# Patient Record
Sex: Female | Born: 1975
Health system: Southern US, Community
[De-identification: ages and names within clinical notes are randomized; demographics above are authoritative.]

## PROBLEM LIST (undated history)

## (undated) DIAGNOSIS — K219 Gastro-esophageal reflux disease without esophagitis: Secondary | ICD-10-CM

## (undated) DIAGNOSIS — K3184 Gastroparesis: Secondary | ICD-10-CM

## (undated) DIAGNOSIS — R519 Headache, unspecified: Secondary | ICD-10-CM

## (undated) DIAGNOSIS — R51 Headache: Secondary | ICD-10-CM

## (undated) DIAGNOSIS — T8859XA Other complications of anesthesia, initial encounter: Secondary | ICD-10-CM

## (undated) DIAGNOSIS — F329 Major depressive disorder, single episode, unspecified: Secondary | ICD-10-CM

## (undated) DIAGNOSIS — M549 Dorsalgia, unspecified: Secondary | ICD-10-CM

## (undated) DIAGNOSIS — R7303 Prediabetes: Secondary | ICD-10-CM

## (undated) DIAGNOSIS — Z9889 Other specified postprocedural states: Secondary | ICD-10-CM

## (undated) DIAGNOSIS — D219 Benign neoplasm of connective and other soft tissue, unspecified: Secondary | ICD-10-CM

## (undated) DIAGNOSIS — F319 Bipolar disorder, unspecified: Secondary | ICD-10-CM

## (undated) DIAGNOSIS — J189 Pneumonia, unspecified organism: Secondary | ICD-10-CM

## (undated) DIAGNOSIS — F4310 Post-traumatic stress disorder, unspecified: Secondary | ICD-10-CM

## (undated) DIAGNOSIS — F419 Anxiety disorder, unspecified: Secondary | ICD-10-CM

## (undated) DIAGNOSIS — T7840XA Allergy, unspecified, initial encounter: Secondary | ICD-10-CM

## (undated) DIAGNOSIS — G4730 Sleep apnea, unspecified: Secondary | ICD-10-CM

## (undated) DIAGNOSIS — F32A Depression, unspecified: Secondary | ICD-10-CM

## (undated) DIAGNOSIS — R112 Nausea with vomiting, unspecified: Secondary | ICD-10-CM

## (undated) DIAGNOSIS — K829 Disease of gallbladder, unspecified: Secondary | ICD-10-CM

## (undated) DIAGNOSIS — E559 Vitamin D deficiency, unspecified: Secondary | ICD-10-CM

## (undated) DIAGNOSIS — H9319 Tinnitus, unspecified ear: Secondary | ICD-10-CM

## (undated) DIAGNOSIS — Z973 Presence of spectacles and contact lenses: Secondary | ICD-10-CM

## (undated) DIAGNOSIS — T4145XA Adverse effect of unspecified anesthetic, initial encounter: Secondary | ICD-10-CM

## (undated) HISTORY — DX: Major depressive disorder, single episode, unspecified: F32.9

## (undated) HISTORY — DX: Gastroparesis: K31.84

## (undated) HISTORY — PX: ABDOMINAL HYSTERECTOMY: SHX81

## (undated) HISTORY — DX: Anxiety disorder, unspecified: F41.9

## (undated) HISTORY — DX: Benign neoplasm of connective and other soft tissue, unspecified: D21.9

## (undated) HISTORY — DX: Allergy, unspecified, initial encounter: T78.40XA

## (undated) HISTORY — DX: Depression, unspecified: F32.A

## (undated) HISTORY — PX: WISDOM TOOTH EXTRACTION: SHX21

## (undated) HISTORY — DX: Dorsalgia, unspecified: M54.9

## (undated) HISTORY — DX: Gastro-esophageal reflux disease without esophagitis: K21.9

## (undated) HISTORY — PX: UPPER GASTROINTESTINAL ENDOSCOPY: SHX188

## (undated) HISTORY — PX: PILONIDAL CYST EXCISION: SHX744

## (undated) HISTORY — DX: Bipolar disorder, unspecified: F31.9

## (undated) HISTORY — DX: Vitamin D deficiency, unspecified: E55.9

## (undated) HISTORY — DX: Disease of gallbladder, unspecified: K82.9

---

## 1995-09-15 HISTORY — PX: DILATION AND CURETTAGE OF UTERUS: SHX78

## 2006-07-15 HISTORY — PX: CHOLECYSTECTOMY: SHX55

## 2009-09-14 HISTORY — PX: ANKLE ARTHROSCOPY: SHX545

## 2009-09-14 HISTORY — PX: TENDON REPAIR: SHX5111

## 2012-09-14 DIAGNOSIS — K3184 Gastroparesis: Secondary | ICD-10-CM

## 2012-09-14 HISTORY — DX: Gastroparesis: K31.84

## 2015-10-19 DIAGNOSIS — F3489 Other specified persistent mood disorders: Secondary | ICD-10-CM | POA: Diagnosis not present

## 2015-10-26 DIAGNOSIS — F3489 Other specified persistent mood disorders: Secondary | ICD-10-CM | POA: Diagnosis not present

## 2015-10-26 DIAGNOSIS — F4310 Post-traumatic stress disorder, unspecified: Secondary | ICD-10-CM | POA: Diagnosis not present

## 2015-11-09 DIAGNOSIS — F4310 Post-traumatic stress disorder, unspecified: Secondary | ICD-10-CM | POA: Diagnosis not present

## 2015-11-09 DIAGNOSIS — F3489 Other specified persistent mood disorders: Secondary | ICD-10-CM | POA: Diagnosis not present

## 2015-11-11 MED FILL — L-METHYLFOLATE CALCIUM 15 M: 15 | 30 days supply | Qty: 30 | Fill #0

## 2015-11-11 MED FILL — QUETIAPINE FUMARATE 400 MG: 400 | 30 days supply | Qty: 30 | Fill #0

## 2015-11-11 MED FILL — clonazePAM 1 MG TABS: 1 | 30 days supply | Qty: 60 | Fill #0

## 2015-11-13 MED FILL — lamoTRIgine 200 MG TABS: 200 | 30 days supply | Qty: 30 | Fill #0

## 2015-11-16 DIAGNOSIS — F4310 Post-traumatic stress disorder, unspecified: Secondary | ICD-10-CM | POA: Diagnosis not present

## 2015-11-16 DIAGNOSIS — F3489 Other specified persistent mood disorders: Secondary | ICD-10-CM | POA: Diagnosis not present

## 2015-11-23 DIAGNOSIS — F4310 Post-traumatic stress disorder, unspecified: Secondary | ICD-10-CM | POA: Diagnosis not present

## 2015-11-23 DIAGNOSIS — F3489 Other specified persistent mood disorders: Secondary | ICD-10-CM | POA: Diagnosis not present

## 2015-11-26 DIAGNOSIS — F3181 Bipolar II disorder: Secondary | ICD-10-CM | POA: Diagnosis not present

## 2015-11-27 DIAGNOSIS — F3181 Bipolar II disorder: Secondary | ICD-10-CM | POA: Diagnosis not present

## 2015-12-01 ENCOUNTER — Ambulatory Visit (INDEPENDENT_AMBULATORY_CARE_PROVIDER_SITE_OTHER): Payer: 59 | Admitting: Family Medicine

## 2015-12-01 VITALS — BP 114/66 | HR 77 | Temp 97.5°F | Resp 16 | Ht 64.5 in | Wt 270.4 lb

## 2015-12-01 DIAGNOSIS — E661 Drug-induced obesity: Secondary | ICD-10-CM | POA: Diagnosis not present

## 2015-12-01 DIAGNOSIS — Z13220 Encounter for screening for lipoid disorders: Secondary | ICD-10-CM | POA: Diagnosis not present

## 2015-12-01 DIAGNOSIS — Z131 Encounter for screening for diabetes mellitus: Secondary | ICD-10-CM

## 2015-12-01 DIAGNOSIS — R3129 Other microscopic hematuria: Secondary | ICD-10-CM

## 2015-12-01 DIAGNOSIS — Z0000 Encounter for general adult medical examination without abnormal findings: Secondary | ICD-10-CM | POA: Diagnosis not present

## 2015-12-01 DIAGNOSIS — Z1159 Encounter for screening for other viral diseases: Secondary | ICD-10-CM | POA: Diagnosis not present

## 2015-12-01 DIAGNOSIS — Z114 Encounter for screening for human immunodeficiency virus [HIV]: Secondary | ICD-10-CM

## 2015-12-01 DIAGNOSIS — F3131 Bipolar disorder, current episode depressed, mild: Secondary | ICD-10-CM | POA: Diagnosis not present

## 2015-12-01 LAB — POCT URINALYSIS DIP (MANUAL ENTRY)
Bilirubin, UA: NEGATIVE
Glucose, UA: NEGATIVE
Ketones, POC UA: NEGATIVE
Leukocytes, UA: NEGATIVE
Nitrite, UA: POSITIVE — AB
Spec Grav, UA: >=1.03
Urobilinogen, UA: 0.2
pH, UA: 5.5

## 2015-12-01 LAB — CBC WITH DIFFERENTIAL/PLATELET
Basophils Absolute: 0 10*3/uL (ref 0.0–0.1)
Basophils Relative: 0 % (ref 0–1)
Eosinophils Absolute: 0.4 10*3/uL (ref 0.0–0.7)
Eosinophils Relative: 5 % (ref 0–5)
HCT: 37.3 % (ref 36.0–46.0)
Hemoglobin: 12.4 g/dL (ref 12.0–15.0)
Lymphocytes Relative: 25 % (ref 12–46)
Lymphs Abs: 1.9 10*3/uL (ref 0.7–4.0)
MCH: 27.9 pg (ref 26.0–34.0)
MCHC: 33.2 g/dL (ref 30.0–36.0)
MCV: 84 fL (ref 78.0–100.0)
MPV: 10.9 fL (ref 8.6–12.4)
Monocytes Absolute: 0.6 10*3/uL (ref 0.1–1.0)
Monocytes Relative: 8 % (ref 3–12)
Neutro Abs: 4.7 10*3/uL (ref 1.7–7.7)
Neutrophils Relative %: 62 % (ref 43–77)
Platelets: 318 10*3/uL (ref 150–400)
RBC: 4.44 MIL/uL (ref 3.87–5.11)
RDW: 15.1 % (ref 11.5–15.5)
WBC: 7.5 10*3/uL (ref 4.0–10.5)

## 2015-12-01 LAB — POC MICROSCOPIC URINALYSIS (UMFC)

## 2015-12-01 LAB — COMPREHENSIVE METABOLIC PANEL
ALT: 13 U/L (ref 6–29)
AST: 15 U/L (ref 10–30)
Albumin: 3.9 g/dL (ref 3.6–5.1)
Alkaline Phosphatase: 89 U/L (ref 33–115)
BUN: 11 mg/dL (ref 7–25)
CO2: 24 mmol/L (ref 20–31)
Calcium: 8.9 mg/dL (ref 8.6–10.2)
Chloride: 103 mmol/L (ref 98–110)
Creat: 0.89 mg/dL (ref 0.50–1.10)
Glucose, Bld: 87 mg/dL (ref 65–99)
Potassium: 4.2 mmol/L (ref 3.5–5.3)
Sodium: 137 mmol/L (ref 135–146)
Total Bilirubin: 0.3 mg/dL (ref 0.2–1.2)
Total Protein: 7.6 g/dL (ref 6.1–8.1)

## 2015-12-01 LAB — LIPID PANEL
Cholesterol: 147 mg/dL (ref 125–200)
HDL: 36 mg/dL — ABNORMAL LOW (ref >=46)
LDL Cholesterol: 74 mg/dL (ref <130)
Total CHOL/HDL Ratio: 4.1 Ratio (ref <=5.0)
Triglycerides: 184 mg/dL — ABNORMAL HIGH (ref <150)
VLDL: 37 mg/dL — ABNORMAL HIGH (ref <30)

## 2015-12-01 LAB — HEMOGLOBIN A1C
Hgb A1c MFr Bld: 5.6 % (ref <5.7)
Mean Plasma Glucose: 114 mg/dL (ref <117)

## 2015-12-01 LAB — VITAMIN B12: Vitamin B-12: 309 pg/mL (ref 200–1100)

## 2015-12-01 LAB — HIV ANTIBODY (ROUTINE TESTING W REFLEX): HIV 1&2 Ab, 4th Generation: NONREACTIVE

## 2015-12-01 LAB — TSH: TSH: 1.83 mIU/L

## 2015-12-01 LAB — T4, FREE: Free T4: 1 ng/dL (ref 0.8–1.8)

## 2015-12-01 NOTE — Progress Notes (Signed)
Subjective:    Patient ID: Caroline Gonzales, female    DOB: August 12, 1976, 40 y.o.   MRN: [id number]  12/01/2015  Annual Exam   HPI This 40 y.o. female presents for Complete Physical Examination.  Last physical:  2014 Pap smear:  Scheduled in two weeks; needs IUD changed out. Mammogram:  To see gynecology this month. Colonoscopy: never TDAP:  09/2015 Pneumovax:  never Influenza:  06/2015 Eye exam:  +glasses; scheduled next month Dental exam:  Next month.  Sees David Fuller/psychiatry every 3 months; followed by therapist every week.  DDD lumbar: s/p several injections lumbar spine; no injections in one year.  Lost 17 pounds since move.  Decreased eating out to once per week; no breads or chips until eats fruits and vegetables.  Son has lost weight. Moved from [place].   Review of Systems  Constitutional: Negative for fever, chills, diaphoresis, activity change, appetite change, fatigue and unexpected weight change.  HENT: Negative for congestion, dental problem, drooling, ear discharge, ear pain, facial swelling, hearing loss, mouth sores, nosebleeds, postnasal drip, rhinorrhea, sinus pressure, sneezing, sore throat, tinnitus, trouble swallowing and voice change.   Eyes: Negative for photophobia, pain, discharge, redness, itching and visual disturbance.  Respiratory: Negative for apnea, cough, choking, chest tightness, shortness of breath, wheezing and stridor.   Cardiovascular: Negative for chest pain, palpitations and leg swelling.  Gastrointestinal: Negative for nausea, vomiting, abdominal pain, diarrhea, constipation, blood in stool, abdominal distention, anal bleeding and rectal pain.  Endocrine: Negative for cold intolerance, heat intolerance, polydipsia, polyphagia and polyuria.  Genitourinary: Negative for dysuria, urgency, frequency, hematuria, flank pain, decreased urine volume, vaginal bleeding, vaginal discharge, enuresis, difficulty urinating, genital sores, vaginal pain,  menstrual problem, pelvic pain and dyspareunia.  Musculoskeletal: Negative for myalgias, back pain, joint swelling, arthralgias, gait problem, neck pain and neck stiffness.  Skin: Negative for color change, pallor, rash and wound.  Allergic/Immunologic: Negative for environmental allergies, food allergies and immunocompromised state.  Neurological: Negative for dizziness, tremors, seizures, syncope, facial asymmetry, speech difficulty, weakness, light-headedness, numbness and headaches.  Hematological: Negative for adenopathy. Does not bruise/bleed easily.  Psychiatric/Behavioral: Negative for suicidal ideas, hallucinations, behavioral problems, confusion, sleep disturbance, self-injury, dysphoric mood, decreased concentration and agitation. The patient is not nervous/anxious and is not hyperactive.     Past Medical History  Diagnosis Date  . Depression   . Gastroparesis 09/14/2012    gastric emptying study in 2014  . Allergy     Zyrtec, Allegra.  . Anxiety     followed by Dr. Toy Cookey   Past Surgical History  Procedure Laterality Date  . Cholecystectomy    . Tendon repair Left     Left Ankle  . Cesarean section     Allergies  Allergen Reactions  . Tamiflu [Oseltamivir Phosphate] Anaphylaxis  . Cephalosporins Rash  . Penicillins Rash   Current Outpatient Prescriptions  Medication Sig Dispense Refill  . cetirizine (ZYRTEC) 10 MG tablet Take 10 mg by mouth daily.    . clonazePAM (KLONOPIN) 1 MG tablet Take 1 mg by mouth 2 (two) times daily as needed for anxiety.    . L-methylfolate Calcium 15 MG TABS Take 15 m by mouth daily.    [name] lamoTRIgine (LAMICTAL) 200 MG tablet Take 200 mg by mouth at bedtime.    . propranolol (INDERAL) 40 MG tablet Take 40 mg by mouth 2 (two) times daily.    . QUEtiapine (SEROQUEL) 400 MG tablet Take 400 mg by mouth at bedtime.    [name]  ranitidine (ZANTAC) 150 MG tablet Take 150 mg by mouth at bedtime.     No current facility-administered medications for this  visit.   Social History   Social History  . Marital Status: Married    Spouse Name: N/A  . Number of Children: N/A  . Years of Education: N/A   Occupational History  . Not on file.   Social History Main Topics  . Smoking status: Never Smoker   . Smokeless tobacco: Never Used  . Alcohol Use: No  . Drug Use: No  . Sexual Activity: Not on file   Other Topics Concern  . 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      Seatbelt: 100%; no texting while driving often       Family History  Problem Relation Age of Onset  . Cancer Mother     squamous cell carcinoma  . Heart disease Father     cardiomegaly, CHF  . Hyperlipidemia Father   . Hypertension Father   . Mental retardation Father   . Cancer Maternal Grandmother   . Diabetes Maternal Grandmother   . Heart disease Maternal Grandmother   . Hyperlipidemia Maternal Grandmother   . Hypertension Maternal Grandmother   . Stroke Maternal Grandmother   . Heart disease Paternal Grandmother   . Hypertension Paternal Grandmother   . Heart disease Paternal Grandfather   . Hyperlipidemia Paternal Grandfather   . Mental illness Paternal Grandfather        Objective:    BP 114/66 mmHg  Pulse 77  Temp(Src) 97.5 F (36.4 C) (Oral)  Resp 16  Ht 5' 4.5" (1.638 m)  Wt 270 lb 6.4 oz (122.653 kg)  BMI 45.71 kg/m2  SpO2 98% Physical Exam  Constitutional: She is oriented to person, place, and time. She appears well-developed and well-nourished. No distress.  obese  HENT:  Head: Normocephalic and atraumatic.  Right Ear: External ear normal.  Left Ear: External ear normal.  Nose: Nose normal.    Mouth/Throat: Oropharynx is clear and moist.  Eyes: Conjunctivae and EOM are normal. Pupils are equal, round, and reactive to light.  Neck: Normal range of motion and full passive range of motion without pain. Neck supple. No JVD present. Carotid bruit is not present. No thyromegaly present.  Cardiovascular: Normal rate, regular rhythm and normal heart sounds.  Exam reveals no gallop and no friction rub.   No murmur heard. Pulmonary/Chest: Effort normal and breath sounds normal. She has no wheezes. She has no rales.  Abdominal: Soft. Bowel sounds are normal. She exhibits no distension and no mass. There is no tenderness. There is no rebound and no guarding.  Musculoskeletal:       Right shoulder: Normal.       Left shoulder: Normal.       Cervical back: Normal.  Lymphadenopathy:    She has no cervical adenopathy.  Neurological: She is alert and oriented to person, place, and time. She has normal reflexes. No cranial nerve deficit. She exhibits normal muscle tone. Coordination  normal.  Skin: Skin is warm and dry. No rash noted. She is not diaphoretic. No erythema. No pallor.  Scattered skin changes on upper arms and upper back; no irregularity.  Psychiatric: She has a normal mood and affect. Her behavior is normal. Judgment and thought content normal.  Nursing note and vitals reviewed.  No results found for this or any previous visit.     Assessment & Plan:   1. Routine physical examination   2. Screening for diabetes mellitus   3. Screening, lipid   4. Mild depressed bipolar I disorder ([place])   5. Screening for HIV (human immunodeficiency virus)   6. Hematuria, microscopic   7. Morbid drug-induced obesity    -anticipatory guidance -- weight loss, exercise, low-caloric food choices. [email or web address].[email or web address] -immunizations UTD. -obtain labs. -gynecological care through gynecology; Mirena IUD for contraception.   Orders Placed This Encounter  Procedures  . CBC with Differential/Platelet   . Comprehensive metabolic panel    Order Specific Question:  Has the patient fasted?    Answer:  Yes  . Hemoglobin A1c  . Lipid panel    Order Specific Question:  Has the patient fasted?    Answer:  Yes  . TSH  . T4, free  . Vitamin B12  . VITAMIN D 25 Hydroxy (Vit-D Deficiency, Fractures)  . HIV antibody  . POCT urinalysis dipstick  . POCT Microscopic Urinalysis ([facility])   Meds ordered this encounter  Medications  . ranitidine (ZANTAC) 150 MG tablet    Sig: Take 150 mg by mouth at bedtime.  . cetirizine (ZYRTEC) 10 MG tablet    Sig: Take 10 mg by mouth daily.  . QUEtiapine (SEROQUEL) 400 MG tablet    Sig: Take 400 mg by mouth at bedtime.  . lamoTRIgine (LAMICTAL) 200 MG tablet    Sig: Take 200 mg by mouth at bedtime.  . L-methylfolate Calcium 15 MG TABS    Sig: Take 15 m by mouth daily.  . clonazePAM (KLONOPIN) 1 MG tablet    Sig: Take 1 mg by mouth 2 (two) times daily as needed for anxiety.  . propranolol (INDERAL) 40 MG tablet    Sig: Take 40 mg by mouth 2 (two) times daily.    No Follow-up on file.    Caroline Gonzales Elayne Guerin, M.D. [facility] [address] [place], [place]  [address] [phone] phone [phone] fax

## 2015-12-01 NOTE — Patient Instructions (Addendum)
IF you received an x-ray today, you will receive an invoice from [facility]. Please contact [facility] at [phone] with questions or concerns regarding your invoice.   IF you received labwork today, you will receive an invoice from [employer]. Please contact [facility] at [phone] with questions or concerns regarding your invoice.   Our billing staff will not be able to assist you with questions regarding bills from these companies.  You will be contacted with the lab results as soon as they are available. The fastest way to get your results is to activate your My Chart account. Instructions are located on the last page of this paperwork. If you have not heard from [name] regarding the results in 2 weeks, please contact this office.    Dr. Jacelyn Grip of [place] for back issues. [facility]. [email or web address]    Keeping You Healthy  Get These Tests 1. Blood Pressure- Have your blood pressure checked once a year by your health care provider.  Normal blood pressure is 120/80. 2. Weight- Have your body mass index (BMI) calculated to screen for obesity.  BMI is measure of body fat based on height and weight.  You can also calculate your own BMI at GravelBags.it. 3. Cholesterol- Have your cholesterol checked every 5 years starting at age 34 then yearly starting at age 41. 18. Chlamydia, HIV, and other sexually transmitted diseases- Get screened every year until age 25, then within three months of each new sexual provider. 5. Pap Test - Every 1-5 years; discuss with your health care provider. 6. Mammogram- Every 1-2 years starting at age 5--50  Take these medicines  Calcium with Vitamin D-Your body needs 1200 mg of Calcium each day and [phone] IU of Vitamin D daily.  Your body can only absorb 500 mg of Calcium at a time so Calcium must be taken in 2 or 3 divided doses throughout the day.  Multivitamin with folic  acid- Once daily if it is possible for you to become pregnant.  Get these Immunizations  Gardasil-Series of three doses; prevents HPV related illness such as genital warts and cervical cancer.  Menactra-Single dose; prevents meningitis.  Tetanus shot- Every 10 years.  Flu shot-Every year.  Take these steps 1. Do not smoke-Your healthcare provider can help you quit.  For tips on how to quit go to www.smokefree.gov or call 1-800 QUITNOW. 2. Be physically active- Exercise 5 days a week for at least 30 minutes.  If you are not already physically active, start slow and gradually work up to 30 minutes of moderate physical activity.  Examples of moderate activity include walking briskly, dancing, swimming, bicycling, etc. 3. Breast Cancer- A self breast exam every month is important for early detection of breast cancer.  For more information and instruction on self breast exams, ask your healthcare provider or https://www.patel.info/. 4. Eat a healthy diet- Eat a variety of healthy foods such as fruits, vegetables, whole grains, low fat milk, low fat cheeses, yogurt, lean meats, poultry and fish, beans, nuts, tofu, etc.  For more information go to www. Thenutritionsource.org 5. Drink alcohol in moderation- Limit alcohol intake to one drink or less per day. Never drink and drive. 6. Depression- Your emotional health is as important as your physical health.  If you're feeling down or losing interest in things you normally enjoy please talk to your healthcare provider about being screened for depression. 7. Dental visit- Brush and floss your teeth twice daily; visit your dentist  twice a year. 8. Eye doctor- Get an eye exam at least every 2 years. 9. Helmet use- Always wear a helmet when riding a bicycle, motorcycle, rollerblading or skateboarding. 42. Safe sex- If you may be exposed to sexually transmitted infections, use a condom. 11. Seat belts- Seat belts can save your live;  always wear one. 12. Smoke/Carbon Monoxide detectors- These detectors need to be installed on the appropriate level of your home. Replace batteries at least once a year. 13. Skin cancer- When out in the sun please cover up and use sunscreen 15 SPF or higher. 14. Violence- If anyone is threatening or hurting you, please tell your healthcare provider.

## 2015-12-02 LAB — VITAMIN D 25 HYDROXY (VIT D DEFICIENCY, FRACTURES): Vit D, 25-Hydroxy: 12 ng/mL — ABNORMAL LOW (ref 30–100)

## 2015-12-05 ENCOUNTER — Encounter: Payer: Self-pay | Admitting: Family Medicine

## 2015-12-09 ENCOUNTER — Encounter: Payer: Self-pay | Admitting: Family Medicine

## 2015-12-09 MED FILL — clonazePAM 1 MG TABS: 1 | 30 days supply | Qty: 60 | Fill #1

## 2015-12-09 MED FILL — lamoTRIgine 200 MG TABS: 200 | 30 days supply | Qty: 30 | Fill #1

## 2015-12-09 MED FILL — QUETIAPINE FUMARATE 400 MG: 400 | 30 days supply | Qty: 30 | Fill #1

## 2015-12-09 MED FILL — L-METHYLFOLATE CALCIUM 15 M: 15 | 30 days supply | Qty: 30 | Fill #1

## 2015-12-12 ENCOUNTER — Encounter: Payer: Self-pay | Admitting: Obstetrics and Gynecology

## 2015-12-12 ENCOUNTER — Ambulatory Visit (INDEPENDENT_AMBULATORY_CARE_PROVIDER_SITE_OTHER): Payer: 59 | Admitting: Obstetrics and Gynecology

## 2015-12-12 ENCOUNTER — Other Ambulatory Visit: Payer: Self-pay

## 2015-12-12 VITALS — BP 112/70 | HR 84 | Resp 16 | Ht 64.5 in | Wt 269.0 lb

## 2015-12-12 DIAGNOSIS — Z01419 Encounter for gynecological examination (general) (routine) without abnormal findings: Secondary | ICD-10-CM | POA: Diagnosis not present

## 2015-12-12 DIAGNOSIS — Z30431 Encounter for routine checking of intrauterine contraceptive device: Secondary | ICD-10-CM

## 2015-12-12 DIAGNOSIS — Z1231 Encounter for screening mammogram for malignant neoplasm of breast: Secondary | ICD-10-CM

## 2015-12-12 DIAGNOSIS — Z124 Encounter for screening for malignant neoplasm of cervix: Secondary | ICD-10-CM

## 2015-12-12 DIAGNOSIS — Z1151 Encounter for screening for human papillomavirus (HPV): Secondary | ICD-10-CM | POA: Diagnosis not present

## 2015-12-12 NOTE — Patient Instructions (Signed)

## 2015-12-12 NOTE — Progress Notes (Signed)
Patient ID: Caroline Gonzales, female   DOB: [birth year], 40 y.o.   MRN: [id number] 40 y.o. G1P1001 MarriedCaucasianF here for annual exam.  She has a mirena, placed 3 years ago. No cycles. Starting to have cramping 1 x a month, helped with ibuprofen. Last summer she was having increased cramping, ultrasound showed the IUD in place. She isn't sure how long her strings were. Since then the pain is worse, but just coming 1 x a month. Sexually active, no pain. She is working on weight loss, down 18 lbs in 2-3 months.     No LMP recorded. Patient is not currently having periods (Reason: IUD).          Sexually active: Yes.    The current method of family planning is IUD.    Exercising: No.  The patient does not participate in regular exercise at present. She is walking.  Smoker:  no  Health Maintenance: Pap:  2015 WNL per patient  History of abnormal Pap:  no MMG:  Never Colonoscopy:  Never BMD:   Never TDaP:  09/2015 Gardasil: N/A   reports that she has never smoked. She has never used smokeless tobacco. She reports that she drinks alcohol. She reports that she does not use illicit drugs.Minimal ETOH, palliative care nurse. She has a 75 year old son.   Past Medical History  Diagnosis Date  . Depression   . Gastroparesis 09/14/2012    gastric emptying study in 2014  . Allergy     Zyrtec, Allegra.  . Anxiety     followed by Dr. Toy Cookey  . Fibroid   1 small fibroid  Past Surgical History  Procedure Laterality Date  . Cholecystectomy    . Tendon repair Left     Left Ankle  . Cesarean section    . Dilation and curettage of uterus      Current Outpatient Prescriptions  Medication Sig Dispense Refill  . cetirizine (ZYRTEC) 10 MG tablet Take 10 mg by mouth daily.    . clonazePAM (KLONOPIN) 1 MG tablet Take 1 mg by mouth 2 (two) times daily as needed for anxiety.    . L-methylfolate Calcium 15 MG TABS Take 15 m by mouth daily.    [name] lamoTRIgine (LAMICTAL) 200 MG tablet Take 200 mg by mouth  at bedtime.    . propranolol (INDERAL) 40 MG tablet Take 40 mg by mouth 2 (two) times daily.    . QUEtiapine (SEROQUEL) 400 MG tablet Take 400 mg by mouth at bedtime.    . ranitidine (ZANTAC) 150 MG tablet Take 150 mg by mouth at bedtime.     No current facility-administered medications for this visit.    Family History  Problem Relation Age of Onset  . Cancer Mother     squamous cell carcinoma  . Heart disease Father     cardiomegaly, CHF  . Hyperlipidemia Father   . Hypertension Father   . Mental retardation Father   . Cancer Maternal Grandmother   . Diabetes Maternal Grandmother   . Heart disease Maternal Grandmother   . Hyperlipidemia Maternal Grandmother   . Hypertension Maternal Grandmother   . Stroke Maternal Grandmother   . Heart disease Paternal Grandmother   . Hypertension Paternal Grandmother   . Heart disease Paternal Grandfather   . Hyperlipidemia Paternal Grandfather   . Mental illness Paternal Grandfather   . Breast cancer Maternal Aunt   . Thyroid cancer Paternal Aunt     Review of Systems  Constitutional: Negative.  HENT: Negative.   Eyes: Negative.   Respiratory: Negative.   Cardiovascular: Negative.   Gastrointestinal: Negative.   Endocrine: Negative.   Genitourinary: Negative.   Musculoskeletal: Negative.   Skin: Negative.   Allergic/Immunologic: Negative.   Neurological: Negative.   Psychiatric/Behavioral: Negative.     Exam:   BP 112/70 mmHg  Pulse 84  Resp 16  Ht 5' 4.5" (1.638 m)  Wt 269 lb (122.018 kg)  BMI 45.48 kg/m2  Weight change: @WEIGHTCHANGE @ Height:   Height: 5' 4.5" (163.8 cm)  Ht Readings from Last 3 Encounters:  12/12/15 5' 4.5" (1.638 m)  12/01/15 5' 4.5" (1.638 m)    General appearance: alert, cooperative and appears stated age Head: Normocephalic, without obvious abnormality, atraumatic Neck: no adenopathy, supple, symmetrical, trachea midline and thyroid normal to inspection and palpation Lungs: clear to  auscultation bilaterally Breasts: normal appearance, no masses or tenderness Heart: regular rate and rhythm Abdomen: soft, non-tender; bowel sounds normal; no masses,  no organomegaly Extremities: extremities normal, atraumatic, no cyanosis or edema Skin: Skin color, texture, turgor normal. No rashes or lesions Lymph nodes: Cervical, supraclavicular, and axillary nodes normal. No abnormal inguinal nodes palpated Neurologic: Grossly normal   Pelvic: External genitalia:  no lesions              Urethra:  normal appearing urethra with no masses, tenderness or lesions              Bartholins and Skenes: normal                 Vagina: normal appearing vagina with normal color and discharge, no lesions              Cervix: no lesions, IUD string 4-5 cm               Bimanual Exam:  Uterus:  normal size, contour, position, consistency, mobility, non-tender              Adnexa: no mass, fullness, tenderness               Rectovaginal: Confirms               Anus:  normal sphincter tone, no lesions  Chaperone was present for exam.  A:  Well Woman with normal exam  Screening cervical cancer  IUD check, she has had the IUD for 3 years, increase in cyclic cramping. She reports a normal ultrasound last summer. The strings are 4-5 cm currently, unsure if changed.   P:   Labs and immunizations with her primary MD  IUD due for removal in 2019  Pap with hpv  Discussed breast self exam  Discussed calcium and vit D intake  Mammogram, number given  Get a copy of her gyn records, if her string is getting longer, would recommend an ultrasound to check location.

## 2015-12-14 DIAGNOSIS — F3489 Other specified persistent mood disorders: Secondary | ICD-10-CM | POA: Diagnosis not present

## 2015-12-14 DIAGNOSIS — F4310 Post-traumatic stress disorder, unspecified: Secondary | ICD-10-CM | POA: Diagnosis not present

## 2015-12-17 ENCOUNTER — Encounter: Payer: Self-pay | Admitting: Family Medicine

## 2015-12-17 DIAGNOSIS — M5136 Other intervertebral disc degeneration, lumbar region: Secondary | ICD-10-CM

## 2015-12-17 LAB — IPS PAP TEST WITH HPV

## 2015-12-17 NOTE — Telephone Encounter (Signed)
There is no referral in the system for neurosurgery.  I am unable to send the patient's records until one is placed.  Thank you.

## 2015-12-19 NOTE — Telephone Encounter (Signed)
Referral placed; please proceed.

## 2015-12-26 DIAGNOSIS — H40013 Open angle with borderline findings, low risk, bilateral: Secondary | ICD-10-CM | POA: Diagnosis not present

## 2015-12-26 DIAGNOSIS — H5210 Myopia, unspecified eye: Secondary | ICD-10-CM | POA: Diagnosis not present

## 2015-12-26 DIAGNOSIS — H2189 Other specified disorders of iris and ciliary body: Secondary | ICD-10-CM | POA: Diagnosis not present

## 2015-12-26 DIAGNOSIS — H40053 Ocular hypertension, bilateral: Secondary | ICD-10-CM | POA: Diagnosis not present

## 2015-12-28 DIAGNOSIS — F4310 Post-traumatic stress disorder, unspecified: Secondary | ICD-10-CM | POA: Diagnosis not present

## 2015-12-28 DIAGNOSIS — F3489 Other specified persistent mood disorders: Secondary | ICD-10-CM | POA: Diagnosis not present

## 2016-01-02 ENCOUNTER — Ambulatory Visit
Admission: RE | Admit: 2016-01-02 | Discharge: 2016-01-02 | Disposition: A | Discharge status: Home / Self Care | Payer: 59 | Source: Ambulatory Visit

## 2016-01-02 DIAGNOSIS — Z1231 Encounter for screening mammogram for malignant neoplasm of breast: Secondary | ICD-10-CM

## 2016-01-04 DIAGNOSIS — F3489 Other specified persistent mood disorders: Secondary | ICD-10-CM | POA: Diagnosis not present

## 2016-01-04 DIAGNOSIS — F4310 Post-traumatic stress disorder, unspecified: Secondary | ICD-10-CM | POA: Diagnosis not present

## 2016-01-11 DIAGNOSIS — F3489 Other specified persistent mood disorders: Secondary | ICD-10-CM | POA: Diagnosis not present

## 2016-01-14 MED FILL — QUETIAPINE FUMARATE 400 MG: 400 | 30 days supply | Qty: 30 | Fill #2

## 2016-01-14 MED FILL — clonazePAM 1 MG TABS: 1 | 30 days supply | Qty: 60 | Fill #2

## 2016-01-14 MED FILL — L-METHYLFOLATE CALCIUM 15 M: 15 | 30 days supply | Qty: 30 | Fill #2

## 2016-01-14 MED FILL — lamoTRIgine 200 MG TABS: 200 | 30 days supply | Qty: 30 | Fill #0

## 2016-01-15 ENCOUNTER — Telehealth: Payer: Self-pay | Admitting: Obstetrics and Gynecology

## 2016-01-15 NOTE — Telephone Encounter (Signed)
I spoke with the patient and she says that her cramping has gotten much better. She will call us if it starts to get bad again and will proceed with the ultra sound then. -eh

## 2016-01-15 NOTE — Telephone Encounter (Signed)
Please let the patient know that I have reviewed her old records. IUD was in place on her ultrasound in 7/16. The chart doesn't mention the length of her IUD strings with the insertion or at the f/u visit. If her cramping is getting worse, the safest thing to do is another ultrasound. Please review with the patient.

## 2016-01-18 DIAGNOSIS — F4310 Post-traumatic stress disorder, unspecified: Secondary | ICD-10-CM | POA: Diagnosis not present

## 2016-01-18 DIAGNOSIS — F3489 Other specified persistent mood disorders: Secondary | ICD-10-CM | POA: Diagnosis not present

## 2016-01-27 DIAGNOSIS — F3181 Bipolar II disorder: Secondary | ICD-10-CM | POA: Diagnosis not present

## 2016-02-01 DIAGNOSIS — F4310 Post-traumatic stress disorder, unspecified: Secondary | ICD-10-CM | POA: Diagnosis not present

## 2016-02-01 DIAGNOSIS — F3489 Other specified persistent mood disorders: Secondary | ICD-10-CM | POA: Diagnosis not present

## 2016-02-07 MED FILL — L-METHYLFOLATE CALCIUM 15 M: 15 | 30 days supply | Qty: 30 | Fill #3

## 2016-02-07 MED FILL — lamoTRIgine 200 MG TABS: 200 | 30 days supply | Qty: 30 | Fill #1

## 2016-02-07 MED FILL — QUETIAPINE FUMARATE 400 MG: 400 | 30 days supply | Qty: 30 | Fill #3

## 2016-02-14 MED FILL — clonazePAM 1 MG TABS: 1 | 30 days supply | Qty: 60 | Fill #3

## 2016-02-15 DIAGNOSIS — F4310 Post-traumatic stress disorder, unspecified: Secondary | ICD-10-CM | POA: Diagnosis not present

## 2016-02-15 DIAGNOSIS — F3489 Other specified persistent mood disorders: Secondary | ICD-10-CM | POA: Diagnosis not present

## 2016-02-18 ENCOUNTER — Telehealth: Payer: 59 | Admitting: Family

## 2016-02-18 DIAGNOSIS — H00015 Hordeolum externum left lower eyelid: Secondary | ICD-10-CM

## 2016-02-18 DIAGNOSIS — M5136 Other intervertebral disc degeneration, lumbar region: Secondary | ICD-10-CM | POA: Diagnosis not present

## 2016-02-18 MED ORDER — NEOMYCIN-POLYMYXIN-HC 3.5-10000-1 OP SUSP: 3.0000 [drp] | Freq: Four times a day (QID) | OPHTHALMIC | Status: DC

## 2016-02-18 NOTE — Progress Notes (Signed)
We are sorry that you are not feeling well. Here is how we plan to help!  Based on what you have shared with me it looks like you have a stye.  A stye is an inflammation of the eyelid.  It is often a red, painful lump near the edge of the eyelid that may look like a boil or a pimple.  A stye develops when an infection occurs at the base of an eyelash.   We have made appropriate suggestions for you based upon your presentation: Your symptoms may indicate an infection of the sclera.  The use of anti-inflammatory and antibiotic eye drops for a week will help resolve this condition.  I have sent in neomycin-polymyxin HC opthalmic suspension, two to three drops in the affected eye every 4 hours.  If your symptoms do not improve over the next two to three days you should be seen in your doctor's office.  HOME CARE:   Wash your hands often!  Let the stye open on its own. Don't squeeze or open it.  Don't rub your eyes. This can irritate your eyes and let in bacteria.  If you need to touch your eyes, wash your hands first.  Don't wear eye makeup or contact lenses until the area has healed.  GET HELP RIGHT AWAY IF:   Your symptoms do not improve.  You develop blurred or loss of vision.  Your symptoms worsen (increased discharge, pain or redness).  Thank you for choosing an e-visit.  Your e-visit answers were reviewed by a board certified advanced clinical practitioner to complete your personal care plan.  Depending upon the condition, your plan could have included both over the counter or prescription medications.  Please review your pharmacy choice.  Make sure the pharmacy is open so you can pick up prescription now.  If there is a problem, you may contact your provider through MyChart messaging and have the prescription routed to another pharmacy.    Your safety is important to us.  If you have drug allergies check your prescription carefully.  For the next 24 hours you can use MyChart to  ask questions about today's visit, request a non-urgent call back, or ask for a work or school excuse.  You will get an email in the next two days asking about your experience.  I hope you that your e-visit has been valuable and will speed your recovery.      

## 2016-02-27 MED FILL — QUETIAPINE FUMARATE 400 MG: 400 | 30 days supply | Qty: 45 | Fill #0

## 2016-02-29 DIAGNOSIS — F3489 Other specified persistent mood disorders: Secondary | ICD-10-CM | POA: Diagnosis not present

## 2016-02-29 DIAGNOSIS — F411 Generalized anxiety disorder: Secondary | ICD-10-CM | POA: Diagnosis not present

## 2016-02-29 DIAGNOSIS — F4310 Post-traumatic stress disorder, unspecified: Secondary | ICD-10-CM | POA: Diagnosis not present

## 2016-03-12 DIAGNOSIS — F3181 Bipolar II disorder: Secondary | ICD-10-CM | POA: Diagnosis not present

## 2016-03-16 DIAGNOSIS — F411 Generalized anxiety disorder: Secondary | ICD-10-CM | POA: Diagnosis not present

## 2016-03-16 DIAGNOSIS — F4310 Post-traumatic stress disorder, unspecified: Secondary | ICD-10-CM | POA: Diagnosis not present

## 2016-03-16 DIAGNOSIS — F3489 Other specified persistent mood disorders: Secondary | ICD-10-CM | POA: Diagnosis not present

## 2016-03-16 MED FILL — lamoTRIgine 200 MG TABS: 200 | 30 days supply | Qty: 30 | Fill #2

## 2016-03-24 MED FILL — QUETIAPINE FUMARATE 400 MG: 400 | 30 days supply | Qty: 45 | Fill #1

## 2016-03-24 MED FILL — L-METHYLFOLATE CALCIUM 15 M: 15 | 30 days supply | Qty: 30 | Fill #4

## 2016-03-24 MED FILL — clonazePAM 1 MG TABS: 1 | 30 days supply | Qty: 60 | Fill #4

## 2016-03-26 MED FILL — MELOXICAM 7.5 MG TABLET: 7.5 | 30 days supply | Qty: 60 | Fill #0

## 2016-03-28 DIAGNOSIS — F411 Generalized anxiety disorder: Secondary | ICD-10-CM | POA: Diagnosis not present

## 2016-03-28 DIAGNOSIS — F4310 Post-traumatic stress disorder, unspecified: Secondary | ICD-10-CM | POA: Diagnosis not present

## 2016-03-28 DIAGNOSIS — F3489 Other specified persistent mood disorders: Secondary | ICD-10-CM | POA: Diagnosis not present

## 2016-04-04 DIAGNOSIS — F3489 Other specified persistent mood disorders: Secondary | ICD-10-CM | POA: Diagnosis not present

## 2016-04-04 DIAGNOSIS — F4310 Post-traumatic stress disorder, unspecified: Secondary | ICD-10-CM | POA: Diagnosis not present

## 2016-04-04 DIAGNOSIS — F411 Generalized anxiety disorder: Secondary | ICD-10-CM | POA: Diagnosis not present

## 2016-04-08 ENCOUNTER — Ambulatory Visit (INDEPENDENT_AMBULATORY_CARE_PROVIDER_SITE_OTHER): Payer: 59

## 2016-04-08 ENCOUNTER — Ambulatory Visit (HOSPITAL_COMMUNITY)
Admission: EM | Admit: 2016-04-08 | Discharge: 2016-04-08 | Disposition: A | Discharge status: Home / Self Care | Payer: 59 | Attending: Family Medicine | Admitting: Family Medicine

## 2016-04-08 ENCOUNTER — Encounter (HOSPITAL_COMMUNITY): Payer: Self-pay | Admitting: Family Medicine

## 2016-04-08 DIAGNOSIS — M79672 Pain in left foot: Secondary | ICD-10-CM

## 2016-04-08 DIAGNOSIS — M795 Residual foreign body in soft tissue: Secondary | ICD-10-CM | POA: Diagnosis not present

## 2016-04-08 NOTE — ED Triage Notes (Signed)
Pt here for pain to the top of her left foot that has been going on for a few days. sts that she tried orthotics and mobic and not better.

## 2016-04-08 NOTE — Discharge Instructions (Signed)
No fracture or dislocation is seen. Mild degenerative changes the 1st MTP joint. Radiopaque foreign body (fractured needle) along the base of the 1st/2nd digits, reportedly chronic. IMPRESSION: No acute osseus abnormality is seen. Radiopaque foreign body (fractured needle) along the base of the 1st/2nd digits, reportedly chronic.

## 2016-04-08 NOTE — ED Provider Notes (Signed)
CSN: [id number]     Arrival date & time 04/08/16  1745 History   First MD Initiated Contact with Patient 04/08/16 1911     Chief Complaint  Patient presents with  . Foot Pain   (Consider location/radiation/quality/duration/timing/severity/associated sxs/prior Treatment) HPI History obtained from patient:  Pt presents with the cc of:  Left foot pain Duration of symptoms: 3-4 days Treatment prior to arrival: Cold compresses, MobiC Context: Patient just recently started Tap Dancing and now has pain in her anterior portion of the left foot. Other symptoms include: Pain late in the afternoon Pain score: 4 FAMILY HISTORY: Breast cancer    Past Medical History:  Diagnosis Date  . Allergy    Zyrtec, Allegra.  . Anxiety    followed by Dr. Toy Cookey  . Depression   . Fibroid   . Gastroparesis 09/14/2012   gastric emptying study in 2014   Past Surgical History:  Procedure Laterality Date  . CESAREAN SECTION    . CHOLECYSTECTOMY    . DILATION AND CURETTAGE OF UTERUS    . TENDON REPAIR Left    Left Ankle   Family History  Problem Relation Age of Onset  . Cancer Mother     squamous cell carcinoma  . Heart disease Father     cardiomegaly, CHF  . Hyperlipidemia Father   . Hypertension Father   . Mental retardation Father   . Cancer Maternal Grandmother   . Diabetes Maternal Grandmother   . Heart disease Maternal Grandmother   . Hyperlipidemia Maternal Grandmother   . Hypertension Maternal Grandmother   . Stroke Maternal Grandmother   . Heart disease Paternal Grandmother   . Hypertension Paternal Grandmother   . Heart disease Paternal Grandfather   . Hyperlipidemia Paternal Grandfather   . Mental illness Paternal Grandfather   . Breast cancer Maternal Aunt   . Thyroid cancer Paternal Aunt    Social History  Substance Use Topics  . Smoking status: Never Smoker  . Smokeless tobacco: Never Used  . Alcohol use 0.0 oz/week     Comment: rarely - maybe 1 drink per month    OB  History    Gravida Para Term Preterm AB Living   1 1 1     1    SAB TAB Ectopic Multiple Live Births                 Review of Systems  Denies: HEADACHE, NAUSEA, ABDOMINAL PAIN, CHEST PAIN, CONGESTION, DYSURIA, SHORTNESS OF BREATH  Allergies  Tamiflu [oseltamivir phosphate]; Cephalosporins; Latex; and Penicillins  Home Medications   Prior to Admission medications   Medication Sig Start Date End Date Taking? Authorizing Provider  cetirizine (ZYRTEC) 10 MG tablet Take 10 mg by mouth daily.    Historical Provider, MD  clonazePAM (KLONOPIN) 1 MG tablet Take 1 mg by mouth 2 (two) times daily as needed for anxiety.    Historical Provider, MD  L-methylfolate Calcium 15 MG TABS Take 15 m by mouth daily.    Historical Provider, MD  lamoTRIgine (LAMICTAL) 200 MG tablet Take 200 mg by mouth at bedtime.    Historical Provider, MD  neomycin-polymyxin-hydrocortisone (CORTISPORIN) 3.5-10000-1 ophthalmic suspension Place 3 drops into the left eye 4 (four) times daily. 02/18/16   Kennyth Arnold, FNP  propranolol (INDERAL) 40 MG tablet Take 40 mg by mouth 2 (two) times daily.    Historical Provider, MD  QUEtiapine (SEROQUEL) 400 MG tablet Take 400 mg by mouth at bedtime.    Historical Provider, MD  ranitidine (ZANTAC) 150 MG tablet Take 150 mg by mouth at bedtime.    Historical Provider, MD   Meds Ordered and Administered this Visit  Medications - No data to display  BP 137/83   Pulse 93   Temp 98.5 F (36.9 C) (Oral)   Resp 20   SpO2 97%  No data found.   Physical Exam NURSES NOTES AND VITAL SIGNS REVIEWED. CONSTITUTIONAL: Well developed, well nourished, no acute distress HEENT: normocephalic, atraumatic EYES: Conjunctiva normal NECK:normal ROM, supple, no adenopathy PULMONARY:No respiratory distress, normal effort ABDOMINAL: Soft, ND, NT BS+, No CVAT MUSCULOSKELETAL: Normal ROM of all extremities .   SKIN: warm and dry without rash PSYCHIATRIC: Mood and affect, behavior are  normal  Urgent Care Course   Clinical Course    Procedures (including critical care time)  Labs Review Labs Reviewed - No data to display  Imaging Review No results found.   Visual Acuity Review  Right Eye Distance:   Left Eye Distance:   Bilateral Distance:    Right Eye Near:   Left Eye Near:    Bilateral Near:       No suggestion of fracture, chronic changes with old retained fb.  Suggest easing up on tap lessons at this time.   MDM   1. Foot pain, left     Patient is reassured that there are no issues that require transfer to higher level of care at this time or additional tests. Patient is advised to continue home symptomatic treatment. Patient is advised that if there are new or worsening symptoms to attend the emergency department, contact primary care provider, or return to UC. Instructions of care provided discharged home in stable condition.    THIS NOTE WAS GENERATED USING A VOICE RECOGNITION SOFTWARE PROGRAM. ALL REASONABLE EFFORTS  WERE MADE TO PROOFREAD THIS DOCUMENT FOR ACCURACY.  I have verbally reviewed the discharge instructions with the patient. A printed AVS was given to the patient.  All questions were answered prior to discharge.      Konrad Felix, PA 04/08/16 [place], [place] 04/08/16 [phone]

## 2016-04-14 MED FILL — lamoTRIgine 200 MG TABS: 200 | 30 days supply | Qty: 30 | Fill #3

## 2016-04-18 DIAGNOSIS — F3489 Other specified persistent mood disorders: Secondary | ICD-10-CM | POA: Diagnosis not present

## 2016-04-18 DIAGNOSIS — F411 Generalized anxiety disorder: Secondary | ICD-10-CM | POA: Diagnosis not present

## 2016-04-18 DIAGNOSIS — F4310 Post-traumatic stress disorder, unspecified: Secondary | ICD-10-CM | POA: Diagnosis not present

## 2016-04-20 MED FILL — HYDROCODON-APAP 5-325: 5-325 | 10 days supply | Qty: 30 | Fill #0

## 2016-04-22 MED FILL — L-METHYLFOLATE CALCIUM 15 M: 15 | 30 days supply | Qty: 30 | Fill #5

## 2016-04-22 MED FILL — QUETIAPINE FUMARATE 400 MG: 400 | 30 days supply | Qty: 45 | Fill #2

## 2016-04-25 DIAGNOSIS — F4310 Post-traumatic stress disorder, unspecified: Secondary | ICD-10-CM | POA: Diagnosis not present

## 2016-04-25 DIAGNOSIS — F3489 Other specified persistent mood disorders: Secondary | ICD-10-CM | POA: Diagnosis not present

## 2016-04-25 DIAGNOSIS — F411 Generalized anxiety disorder: Secondary | ICD-10-CM | POA: Diagnosis not present

## 2016-04-27 MED FILL — clonazePAM 1 MG TABS: 1 | 30 days supply | Qty: 60 | Fill #0

## 2016-04-28 ENCOUNTER — Telehealth: Payer: Self-pay | Admitting: Obstetrics and Gynecology

## 2016-04-28 NOTE — Telephone Encounter (Signed)
Patient said 'My husband could feel my IUD while we were having sex over the weekend" Patient is now having some cramping and would like to talk with a nurse.

## 2016-04-28 NOTE — Telephone Encounter (Signed)
Called patient, she needs to come in for evaluation. I left a message for her to call in the morning for an appointment.

## 2016-04-29 ENCOUNTER — Ambulatory Visit (INDEPENDENT_AMBULATORY_CARE_PROVIDER_SITE_OTHER): Payer: 59 | Admitting: Obstetrics and Gynecology

## 2016-04-29 ENCOUNTER — Encounter: Payer: Self-pay | Admitting: Obstetrics and Gynecology

## 2016-04-29 VITALS — BP 110/60 | HR 80 | Resp 15 | Wt 278.0 lb

## 2016-04-29 DIAGNOSIS — Z30431 Encounter for routine checking of intrauterine contraceptive device: Secondary | ICD-10-CM | POA: Diagnosis not present

## 2016-04-29 DIAGNOSIS — R102 Pelvic and perineal pain: Secondary | ICD-10-CM | POA: Diagnosis not present

## 2016-04-29 DIAGNOSIS — N926 Irregular menstruation, unspecified: Secondary | ICD-10-CM

## 2016-04-29 LAB — POCT URINE PREGNANCY: Preg Test, Ur: NEGATIVE

## 2016-04-29 NOTE — Progress Notes (Signed)
GYNECOLOGY  VISIT   HPI: 41 y.o.   Married  Caucasian  female   G1P1001 with No LMP recorded. Patient is not currently having periods (Reason: IUD).   here c/o of pain with intercourse and bleeding. She believes her IUD may be out of place. Her mirena IUD was place over 3 years ago. No meses with the IUD. She is now having pain with intercourse, bleed the day after intercourse, changing a pad 3 x a day. She has had bad cramping since the day after intercourse which was last Friday. Cramping is a little better this morning. She took Ibuprofen and tylenol this morning.     GYNECOLOGIC HISTORY: No LMP recorded. Patient is not currently having periods (Reason: IUD). Contraception:IUD Menopausal hormone therapy: None        OB History    Gravida Para Term Preterm AB Living   1 1 1     1    SAB TAB Ectopic Multiple Live Births           1         There are no active problems to display for this patient.   Past Medical History:  Diagnosis Date  . Allergy    Zyrtec, Allegra.  . Anxiety    followed by Dr. Toy Cookey  . Depression   . Fibroid   . Gastroparesis 09/14/2012   gastric emptying study in 2014    Past Surgical History:  Procedure Laterality Date  . CESAREAN SECTION    . CHOLECYSTECTOMY    . DILATION AND CURETTAGE OF UTERUS    . TENDON REPAIR Left    Left Ankle    Current Outpatient Prescriptions  Medication Sig Dispense Refill  . cetirizine (ZYRTEC) 10 MG tablet Take 10 mg by mouth daily.    . clonazePAM (KLONOPIN) 1 MG tablet Take 1 mg by mouth 2 (two) times daily as needed for anxiety.    . L-methylfolate Calcium 15 MG TABS Take 15 m by mouth daily.    [name] lamoTRIgine (LAMICTAL) 200 MG tablet Take 200 mg by mouth at bedtime.    . propranolol (INDERAL) 40 MG tablet Take 40 mg by mouth 2 (two) times daily.    . QUEtiapine (SEROQUEL) 400 MG tablet Take 400 mg by mouth at bedtime. Takes 1.5 tablets PO QD- total 600mg     . ranitidine (ZANTAC) 150 MG tablet Take 150 mg by mouth  at bedtime.     No current facility-administered medications for this visit.      ALLERGIES: Tamiflu [oseltamivir phosphate]; Cephalosporins; Latex; and Penicillins  Family History  Problem Relation Age of Onset  . Cancer Mother     squamous cell carcinoma  . Heart disease Father     cardiomegaly, CHF  . Hyperlipidemia Father   . Hypertension Father   . Mental retardation Father   . Cancer Maternal Grandmother   . Diabetes Maternal Grandmother   . Heart disease Maternal Grandmother   . Hyperlipidemia Maternal Grandmother   . Hypertension Maternal Grandmother   . Stroke Maternal Grandmother   . Heart disease Paternal Grandmother   . Hypertension Paternal Grandmother   . Heart disease Paternal Grandfather   . Hyperlipidemia Paternal Grandfather   . Mental illness Paternal Grandfather   . Breast cancer Maternal Aunt   . Thyroid cancer Paternal Aunt     Social History   Social History  . Marital status: Married    Spouse name: N/A  . Number of children: N/A  .  Years of education: N/A   Occupational History  . Not on file.   Social History Main Topics  . Smoking status: Never Smoker  . Smokeless tobacco: Never Used  . Alcohol use 0.0 oz/week     Comment: rarely - maybe 1 drink per month   . Drug use: No  . Sexual activity: Yes    Partners: Male   Other Topics Concern  . 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      Seatbelt: 100%; no texting while driving often        Review of Systems  Constitutional: Negative.   HENT: Negative.   Eyes: Negative.   Respiratory: Negative.   Cardiovascular: Negative.   Gastrointestinal: Positive  for abdominal pain.  Genitourinary:       Pain and bleeding with intercourse  Musculoskeletal: Negative.   Skin: Negative.   Neurological: Negative.   Endo/Heme/Allergies: Negative.   Psychiatric/Behavioral: Negative.     PHYSICAL EXAMINATION:    BP 110/60 (BP Location: Right Arm, Patient Position: Sitting, Cuff Size: Normal)   Pulse 80   Resp 15   Wt 278 lb (126.1 kg)   BMI 46.98 kg/m     General appearance: alert, cooperative and appears stated age Abdomen: soft, non-tender; bowel sounds normal; no masses,  no organomegaly  Pelvic: External genitalia:  no lesions              Urethra:  normal appearing urethra with no masses, tenderness or lesions              Bartholins and Skenes: normal                 Vagina: normal appearing vagina with normal color and discharge, no lesions              Cervix: no lesions , strings are about 4-5 cm, wrapped up together               Bimanual Exam:  Uterus:  uterus is anteverted, tender, normal sized.               Adnexa: no mass, fullness, tenderness              Chaperone was present for exam.  ASSESSMENT Bleeding and cramping/pelvic pain after intercourse, concerns about movement of IUD    PLAN Check UPT, negative Return for GYN utrasound   An After Visit Summary was printed and given to the patient.  15 minutes face to face time of which over 50% was spent in counseling.

## 2016-04-30 ENCOUNTER — Encounter: Payer: Self-pay | Admitting: Obstetrics and Gynecology

## 2016-04-30 ENCOUNTER — Ambulatory Visit (INDEPENDENT_AMBULATORY_CARE_PROVIDER_SITE_OTHER): Payer: 59 | Admitting: Obstetrics and Gynecology

## 2016-04-30 ENCOUNTER — Ambulatory Visit (INDEPENDENT_AMBULATORY_CARE_PROVIDER_SITE_OTHER): Payer: 59

## 2016-04-30 VITALS — BP 118/62 | HR 80 | Temp 98.3°F | Resp 16 | Wt 279.0 lb

## 2016-04-30 DIAGNOSIS — Z30431 Encounter for routine checking of intrauterine contraceptive device: Secondary | ICD-10-CM | POA: Diagnosis not present

## 2016-04-30 DIAGNOSIS — R102 Pelvic and perineal pain: Secondary | ICD-10-CM | POA: Diagnosis not present

## 2016-04-30 DIAGNOSIS — N719 Inflammatory disease of uterus, unspecified: Secondary | ICD-10-CM | POA: Diagnosis not present

## 2016-04-30 DIAGNOSIS — N926 Irregular menstruation, unspecified: Secondary | ICD-10-CM

## 2016-04-30 LAB — CBC WITH DIFFERENTIAL/PLATELET
Basophils Absolute: 0 cells/uL (ref 0–200)
Basophils Relative: 0 %
Eosinophils Absolute: 198 cells/uL (ref 15–500)
Eosinophils Relative: 2 %
HCT: 35.2 % (ref 35.0–45.0)
Hemoglobin: 11.7 g/dL (ref 11.7–15.5)
Lymphocytes Relative: 26 %
Lymphs Abs: 2574 cells/uL (ref 850–3900)
MCH: 28.2 pg (ref 27.0–33.0)
MCHC: 33.2 g/dL (ref 32.0–36.0)
MCV: 84.8 fL (ref 80.0–100.0)
MPV: 9.5 fL (ref 7.5–12.5)
Monocytes Absolute: 891 cells/uL (ref 200–950)
Monocytes Relative: 9 %
Neutro Abs: 6237 cells/uL (ref 1500–7800)
Neutrophils Relative %: 63 %
Platelets: 300 10*3/uL (ref 140–400)
RBC: 4.15 MIL/uL (ref 3.80–5.10)
RDW: 15.3 % — ABNORMAL HIGH (ref 11.0–15.0)
WBC: 9.9 10*3/uL (ref 3.8–10.8)

## 2016-04-30 MED ORDER — DOXYCYCLINE HYCLATE 100 MG PO CAPS: ORAL_CAPSULE | ORAL | 0 refills | Status: DC

## 2016-04-30 MED FILL — DOXYCYCLINE HYCLATE 100 MG: 100 | 10 days supply | Qty: 20 | Fill #0

## 2016-04-30 NOTE — Progress Notes (Signed)
GYNECOLOGY  VISIT   HPI: 40 y.o.   Married  Caucasian  female   G1P1001 with Patient's last menstrual period was 04/25/2016.   here for follow up irregular bleeding and painful intercourse.    Her cramps have been present x 5 days, still having spotting. The cramping comes and goes, ranges from a 3-7/10 in severity. No fevers, no nausea. She had diarrhea on Monday and Tuesday, but she often has diarrhea with her cycle. Now normal BM. No urinary c/o The pain is in her mid lower abdomen and towards the left lower quadrant. Some radiation to her labia. IUD in place for 3+ years.   GYNECOLOGIC HISTORY: Patient's last menstrual period was 04/25/2016. Contraception:IUD (Mirena) Menopausal hormone therapy: none         OB History    Gravida Para Term Preterm AB Living   1 1 1     1    SAB TAB Ectopic Multiple Live Births           1         There are no active problems to display for this patient.   Past Medical History:  Diagnosis Date  . Allergy    Zyrtec, Allegra.  . Anxiety    followed by Dr. Toy Cookey  . Depression   . Fibroid   . Gastroparesis 09/14/2012   gastric emptying study in 2014    Past Surgical History:  Procedure Laterality Date  . CESAREAN SECTION    . CHOLECYSTECTOMY    . DILATION AND CURETTAGE OF UTERUS    . TENDON REPAIR Left    Left Ankle    Current Outpatient Prescriptions  Medication Sig Dispense Refill  . cetirizine (ZYRTEC) 10 MG tablet Take 10 mg by mouth daily.    . clonazePAM (KLONOPIN) 1 MG tablet Take 1 mg by mouth 2 (two) times daily as needed for anxiety.    . L-methylfolate Calcium 15 MG TABS Take 15 m by mouth daily.    [name] lamoTRIgine (LAMICTAL) 200 MG tablet Take 200 mg by mouth at bedtime.    . propranolol (INDERAL) 40 MG tablet Take 40 mg by mouth 2 (two) times daily.    . QUEtiapine (SEROQUEL) 400 MG tablet Take 400 mg by mouth at bedtime. Takes 1.5 tablets PO QD- total 600mg     . ranitidine (ZANTAC) 150 MG tablet Take 150 mg by mouth at  bedtime.    [name] doxycycline (VIBRAMYCIN) 100 MG capsule Take BID for 10 days.  Take with food as can cause GI distress. 20 capsule 0   No current facility-administered medications for this visit.      ALLERGIES: Tamiflu [oseltamivir phosphate]; Cephalosporins; Latex; and Penicillins  Family History  Problem Relation Age of Onset  . Cancer Mother     squamous cell carcinoma  . Heart disease Father     cardiomegaly, CHF  . Hyperlipidemia Father   . Hypertension Father   . Mental retardation Father   . Cancer Maternal Grandmother   . Diabetes Maternal Grandmother   . Heart disease Maternal Grandmother   . Hyperlipidemia Maternal Grandmother   . Hypertension Maternal Grandmother   . Stroke Maternal Grandmother   . Heart disease Paternal Grandmother   . Hypertension Paternal Grandmother   . Heart disease Paternal Grandfather   . Hyperlipidemia Paternal Grandfather   . Mental illness Paternal Grandfather   . Breast cancer Maternal Aunt   . Thyroid cancer Paternal Aunt     Social History   Social  History  . Marital status: Married    Spouse name: N/A  . Number of children: N/A  . Years of education: N/A   Occupational History  . Not on file.   Social History Main Topics  . Smoking status: Never Smoker  . Smokeless tobacco: Never Used  . Alcohol use 0.0 oz/week     Comment: rarely - maybe 1 drink per month   . Drug use: No  . Sexual activity: Yes    Partners: Male   Other Topics Concern  . 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      Seatbelt: 100%; no texting while driving often        Review of Systems  Constitutional: Negative.    HENT: Negative.   Eyes: Negative.   Respiratory: Negative.   Cardiovascular: Negative.   Gastrointestinal: Negative.   Genitourinary: Negative.   Musculoskeletal: Negative.   Skin: Negative.   Neurological: Negative.   Endo/Heme/Allergies: Negative.   Psychiatric/Behavioral: Negative.     PHYSICAL EXAMINATION:    BP 118/62 (BP Location: Right Arm, Patient Position: Sitting, Cuff Size: Large)   Pulse 80   Temp 98.3 F (36.8 C)   Resp 16   Wt 279 lb (126.6 kg)   LMP 04/25/2016   BMI Caroline.15 kg/m     General appearance: alert, cooperative and appears stated age Abdomen: soft, tender in the suprapubic region and in the LLQ. No rebound, no guarding, no masses,  no organomegaly  Pelvic: External genitalia:  no lesions              Urethra:  normal appearing urethra with no masses, tenderness or lesions              Bartholins and Skenes: normal                 Vagina: normal appearing vagina with normal color and discharge, no lesions              Cervix: no lesions and IUD strings about 5 cm, equivocal CMT.              Bimanual Exam:  Uterus:  anteverted, normal sized, mobile, tender              Adnexa: no masses, slightly tender on the left              Pelvic floor: not tender  Chaperone was present for exam.  Reviewed ultrasound images with the patient  ASSESSMENT Pelvic pain, IUD in place on ultrasound. Uterus is tender on palpation, no definitive CMT. Afebrile Normal ultrasound Possible endometritis    PLAN She is on the IUD for contraception and cycle control Will check a CBC with diff Genprobe sent Negative UPT yesterday Will empirically treat with doxycycline She will continue with her pain meds at home (ibuprofen and she has left over narco for bed time) She will f/u on Monday, sooner with worsening symptoms Will not pull the IUD at this time, may at a future visit  Husband is considering vasectomy, but she will likely still need something for cycle  control    An After Visit Summary was printed and given to the patient.

## 2016-05-01 ENCOUNTER — Encounter: Payer: Self-pay | Admitting: Obstetrics and Gynecology

## 2016-05-01 LAB — GC/CHLAMYDIA PROBE AMP
CT Probe RNA: NOT DETECTED
GC Probe RNA: NOT DETECTED

## 2016-05-01 NOTE — Telephone Encounter (Signed)
See phone encounter.

## 2016-05-01 NOTE — Telephone Encounter (Signed)
My Chart message from patient:   I forgot to ask about the efficacy of the IUD with the abx you started. Is there any risk the whatever-is-going-on or the antibiotic should make me concerned?  Call to patient: Advised that IUD is in place and antiboitic will not interfere with contraception.  Patient states she feels "100 times better" since taking two doses of antibiotic. Has follow-up scheduled for Monday. Advised Dr Talbert Nan will review call and will call back if any additional instructions.

## 2016-05-04 ENCOUNTER — Telehealth: Payer: Self-pay | Admitting: *Deleted

## 2016-05-04 ENCOUNTER — Encounter: Payer: Self-pay | Admitting: Obstetrics and Gynecology

## 2016-05-04 ENCOUNTER — Telehealth: Payer: Self-pay | Admitting: Obstetrics and Gynecology

## 2016-05-04 ENCOUNTER — Ambulatory Visit: Payer: 59 | Admitting: Obstetrics and Gynecology

## 2016-05-04 NOTE — Telephone Encounter (Signed)
Call to patient. Left message to call back regarding follow-up appointment. See next encounter.

## 2016-05-04 NOTE — Telephone Encounter (Signed)
That's fine, you can cancel her appointment for today. I would like to see her sometime in the next week to make sure that she is no longer tender on exam. Please let her know that I'm so glad she is feeling better and don't charge her for today's missed visit.

## 2016-05-04 NOTE — Telephone Encounter (Signed)
See previous phone encounter. Patient declined follow-up appointment today stating she was feeling much better. Dr Talbert Nan recommends follow-up next week. Call to patient, left message to call back, ask for triage nurse.

## 2016-05-04 NOTE — Telephone Encounter (Signed)
Patient called again stating that th problem is completely resolved and does not feel that she needs a follow up appointment.

## 2016-05-04 NOTE — Telephone Encounter (Signed)
Patient cancelled her recheck appointment with Dr Talbert Nan because her symptoms have completely resolved and she can't get off work today. Would like nurse to let her know if she need to come back in for recheck.

## 2016-05-04 NOTE — Telephone Encounter (Signed)
I reached out to this patient to reschedule follow up appointment from today per Dr.Jertson. Patient was unable to reschedule at this time. Patient will check her work schedule and give a call to reschedule.

## 2016-05-04 NOTE — Telephone Encounter (Signed)
Follow up appointment scheduled for 05/13/2016 at 8:15 am with Dr.Jertson. She is agreeable to appointment date and time.  Routing to provider for final review. Patient agreeable to disposition. Will close encounter.

## 2016-05-09 DIAGNOSIS — F3489 Other specified persistent mood disorders: Secondary | ICD-10-CM | POA: Diagnosis not present

## 2016-05-12 DIAGNOSIS — M5136 Other intervertebral disc degeneration, lumbar region: Secondary | ICD-10-CM | POA: Diagnosis not present

## 2016-05-12 MED FILL — lamoTRIgine 200 MG TABS: 200 | 30 days supply | Qty: 30 | Fill #4

## 2016-05-13 ENCOUNTER — Ambulatory Visit (INDEPENDENT_AMBULATORY_CARE_PROVIDER_SITE_OTHER): Payer: 59 | Admitting: Obstetrics and Gynecology

## 2016-05-13 ENCOUNTER — Encounter: Payer: Self-pay | Admitting: Obstetrics and Gynecology

## 2016-05-13 VITALS — BP 120/78 | HR 82 | Resp 16 | Ht 64.5 in | Wt 275.4 lb

## 2016-05-13 DIAGNOSIS — R102 Pelvic and perineal pain: Secondary | ICD-10-CM | POA: Diagnosis not present

## 2016-05-13 NOTE — Progress Notes (Signed)
GYNECOLOGY  VISIT   HPI: 40 y.o.   Married  [place]  female   [phone] with Patient's last menstrual period was 04/25/2016.   here for follow up of abdominal tenderness.  The patient was seen on 8/17 and was treated for a possible endometritis. She had a normal u/s with IUD in place, normal CBC and negative genprobe. Her pain has completely resolved with antibiotics. Feels totally normal, sex is not painful.   GYNECOLOGIC HISTORY: Patient's last menstrual period was 04/25/2016. Contraception:IUD Menopausal hormone therapy: none        OB History    Gravida Para Term Preterm AB Living   1 1 1     1    SAB TAB Ectopic Multiple Live Births           1         There are no active problems to display for this patient.   Past Medical History:  Diagnosis Date  . Allergy    Zyrtec, Allegra.  . Anxiety    followed by Dr. Toy Cookey  . Depression   . Fibroid   . Gastroparesis 09/14/2012   gastric emptying study in 2014    Past Surgical History:  Procedure Laterality Date  . CESAREAN SECTION    . CHOLECYSTECTOMY    . DILATION AND CURETTAGE OF UTERUS    . TENDON REPAIR Left    Left Ankle    Current Outpatient Prescriptions  Medication Sig Dispense Refill  . cetirizine (ZYRTEC) 10 MG tablet Take 10 mg by mouth daily.    . clonazePAM (KLONOPIN) 1 MG tablet Take 1 mg by mouth 2 (two) times daily as needed for anxiety.    . L-methylfolate Calcium 15 MG TABS Take 15 m by mouth daily.    [name] lamoTRIgine (LAMICTAL) 200 MG tablet Take 200 mg by mouth at bedtime.    [name] levonorgestrel (MIRENA) 20 MCG/24HR IUD 1 each by Intrauterine route once.    . propranolol (INDERAL) 40 MG tablet Take 40 mg by mouth 2 (two) times daily.    . QUEtiapine (SEROQUEL) 400 MG tablet Take 400 mg by mouth at bedtime. Takes 1.5 tablets PO QD- total 600mg     . ranitidine (ZANTAC) 150 MG tablet Take 150 mg by mouth at bedtime.     No current facility-administered medications for this visit.       ALLERGIES: Tamiflu [oseltamivir phosphate]; Cephalosporins; Latex; and Penicillins  Family History  Problem Relation Age of Onset  . Cancer Mother     squamous cell carcinoma  . Heart disease Father     cardiomegaly, CHF  . Hyperlipidemia Father   . Hypertension Father   . Mental retardation Father   . Cancer Maternal Grandmother   . Diabetes Maternal Grandmother   . Heart disease Maternal Grandmother   . Hyperlipidemia Maternal Grandmother   . Hypertension Maternal Grandmother   . Stroke Maternal Grandmother   . Heart disease Paternal Grandmother   . Hypertension Paternal Grandmother   . Heart disease Paternal Grandfather   . Hyperlipidemia Paternal Grandfather   . Mental illness Paternal Grandfather   . Breast cancer Maternal Aunt   . Thyroid cancer Paternal Aunt     Social History   Social History  . Marital status: Married    Spouse name: N/A  . Number of children: N/A  . Years of education: N/A   Occupational History  . Not on file.   Social History Main Topics  . Smoking status: Never  Smoker  . Smokeless tobacco: Never Used  . Alcohol use 0.0 oz/week     Comment: rarely - maybe 1 drink per month   . Drug use: No  . Sexual activity: Yes    Partners: Male   Other Topics Concern  . 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      Seatbelt: 100%; no texting while driving often        Review of Systems  Constitutional: Negative.   HENT: Negative.   Eyes: Negative.   Respiratory: Negative.   Cardiovascular: Negative.   Gastrointestinal: Negative.   Genitourinary: Negative.   Musculoskeletal: Negative.   Skin: Negative.    Neurological: Negative.   Endo/Heme/Allergies: Negative.   Psychiatric/Behavioral: Negative.     PHYSICAL EXAMINATION:    BP 120/78 (BP Location: Right Arm, Patient Position: Sitting, Cuff Size: Large)   Pulse 82   Resp 16   Ht 5' 4.5" (1.638 m)   Wt 275 lb 6.4 oz (124.9 kg)   LMP 04/25/2016   BMI 46.54 kg/m     General appearance: alert, cooperative and appears stated age Abdomen: soft, non-tender; no masses,  no organomegaly  Pelvic: External genitalia:  no lesions              Urethra:  normal appearing urethra with no masses, tenderness or lesions                         Cervix: no cervical motion tenderness, IUD strings felt              Bimanual Exam:  Uterus:  normal size, contour, position, consistency, mobility, non-tender              Adnexa: no mass, fullness, tenderness               Chaperone was present for exam.  ASSESSMENT Pelvic pain resolved after treatment with antibiotics, c/w endometritis (negative genprobe)    PLAN Routine f/u   An After Visit Summary was printed and given to the patient.

## 2016-05-16 DIAGNOSIS — F4310 Post-traumatic stress disorder, unspecified: Secondary | ICD-10-CM | POA: Diagnosis not present

## 2016-05-16 DIAGNOSIS — F411 Generalized anxiety disorder: Secondary | ICD-10-CM | POA: Diagnosis not present

## 2016-05-16 DIAGNOSIS — F3489 Other specified persistent mood disorders: Secondary | ICD-10-CM | POA: Diagnosis not present

## 2016-05-20 MED FILL — L-METHYLFOLATE CALCIUM 15 M: 15 | 30 days supply | Qty: 30 | Fill #6

## 2016-05-20 MED FILL — QUETIAPINE FUMARATE 400 MG: 400 | 30 days supply | Qty: 45 | Fill #3

## 2016-06-01 MED FILL — clonazePAM 1 MG TABS: 1 | 30 days supply | Qty: 60 | Fill #1

## 2016-06-06 DIAGNOSIS — F411 Generalized anxiety disorder: Secondary | ICD-10-CM | POA: Diagnosis not present

## 2016-06-06 DIAGNOSIS — F3489 Other specified persistent mood disorders: Secondary | ICD-10-CM | POA: Diagnosis not present

## 2016-06-06 DIAGNOSIS — F4310 Post-traumatic stress disorder, unspecified: Secondary | ICD-10-CM | POA: Diagnosis not present

## 2016-06-11 DIAGNOSIS — F3181 Bipolar II disorder: Secondary | ICD-10-CM | POA: Diagnosis not present

## 2016-06-13 DIAGNOSIS — F3489 Other specified persistent mood disorders: Secondary | ICD-10-CM | POA: Diagnosis not present

## 2016-06-13 DIAGNOSIS — F411 Generalized anxiety disorder: Secondary | ICD-10-CM | POA: Diagnosis not present

## 2016-06-13 DIAGNOSIS — F4310 Post-traumatic stress disorder, unspecified: Secondary | ICD-10-CM | POA: Diagnosis not present

## 2016-06-15 MED FILL — QUETIAPINE FUMARATE 400 MG: 400 | 30 days supply | Qty: 45 | Fill #4

## 2016-06-15 MED FILL — lamoTRIgine 200 MG TABS: 200 | 30 days supply | Qty: 30 | Fill #5

## 2016-06-15 MED FILL — L-METHYLFOLATE CALCIUM 15 M: 15 | 30 days supply | Qty: 30 | Fill #7

## 2016-06-19 ENCOUNTER — Ambulatory Visit (INDEPENDENT_AMBULATORY_CARE_PROVIDER_SITE_OTHER): Payer: 59 | Admitting: Physician Assistant

## 2016-06-19 ENCOUNTER — Ambulatory Visit (INDEPENDENT_AMBULATORY_CARE_PROVIDER_SITE_OTHER): Payer: 59

## 2016-06-19 VITALS — BP 120/80 | HR 96 | Temp 97.8°F | Resp 16 | Ht 66.0 in | Wt 282.0 lb

## 2016-06-19 DIAGNOSIS — R0981 Nasal congestion: Secondary | ICD-10-CM

## 2016-06-19 DIAGNOSIS — R05 Cough: Secondary | ICD-10-CM | POA: Diagnosis not present

## 2016-06-19 DIAGNOSIS — R059 Cough, unspecified: Secondary | ICD-10-CM

## 2016-06-19 LAB — POCT CBC
Granulocyte percent: 69.7 %G (ref 37–80)
HCT, POC: 33.1 % — AB (ref 37.7–47.9)
Hemoglobin: 11.6 g/dL — AB (ref 12.2–16.2)
Lymph, poc: 1.7 (ref 0.6–3.4)
MCH, POC: 29.4 pg (ref 27–31.2)
MCHC: 35.1 g/dL (ref 31.8–35.4)
MCV: 83.9 fL (ref 80–97)
MID (cbc): 0.5 (ref 0–0.9)
MPV: 7.4 fL (ref 0–99.8)
POC Granulocyte: 5.1 (ref 2–6.9)
POC LYMPH PERCENT: 23 %L (ref 10–50)
POC MID %: 7.3 %M (ref 0–12)
Platelet Count, POC: 256 10*3/uL (ref 142–424)
RBC: 3.94 M/uL — AB (ref 4.04–5.48)
RDW, POC: 16.1 %
WBC: 7.3 10*3/uL (ref 4.6–10.2)

## 2016-06-19 LAB — POCT URINALYSIS DIP (MANUAL ENTRY)
Bilirubin, UA: NEGATIVE
Blood, UA: NEGATIVE
Glucose, UA: NEGATIVE
Ketones, POC UA: NEGATIVE
Leukocytes, UA: NEGATIVE
Nitrite, UA: NEGATIVE
Protein Ur, POC: NEGATIVE
Spec Grav, UA: >=1.03
Urobilinogen, UA: 0.2
pH, UA: 5.5

## 2016-06-19 LAB — POCT SEDIMENTATION RATE: POCT SED RATE: 70 mm/hr — AB (ref 0–22)

## 2016-06-19 MED ORDER — DOXYCYCLINE HYCLATE 100 MG PO CAPS: 100.0000 mg | ORAL_CAPSULE | Freq: Two times a day (BID) | ORAL | 0 refills | Status: AC

## 2016-06-19 MED ORDER — CETIRIZINE-PSEUDOEPHEDRINE ER 5-120 MG PO TB12: 1.0000 | ORAL_TABLET | Freq: Two times a day (BID) | ORAL | 0 refills | Status: DC

## 2016-06-19 MED FILL — DOXYCYCLINE HYCLATE 100 MG: 100 | 10 days supply | Qty: 20 | Fill #0

## 2016-06-19 NOTE — Progress Notes (Signed)
Patient ID: Caroline Gonzales, female   DOB: 1976-01-10, 40 y.o.   MRN: [id number]  By signing my name below I, Tereasa Coop, attest that this documentation has been prepared under the direction and in the presence of Philis Fendt, [place]. Electonically Signed. Tereasa Coop, Scribe 06/19/2016 at 9:58 AM   06/19/2016 10:50 AM   DOB: [birth year] / MRN: [id number]   SUBJECTIVE:  Caroline Gonzales is a 40 y.o. female presenting for non-productive cough and nasal congestion for the past month. Pt also reports sore throat and low grade fever. Symptoms have been waxing and waning for the past month. Pt took pseudoephedrine with moderate relief. Started taking norco for chest soreness secondary to cough. Pt also c/o increased fatigue and mild frontal HA.  Pt denies any joint pain. Pt denies any abd pain, tingling, weakness, dizziness, urinary symptoms, N/V/D, or leg swelling. Pt's BMs have been normal.   Pt takes Claritin to control seasonal allergies.  Denies history of asthma.   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  Pt smoked 2 packs a day for 10 years and quit in 2002.   She is allergic to tamiflu [oseltamivir phosphate]; cephalosporins; latex; and penicillins.   She  has a past medical history of Allergy; Anxiety; Depression; Fibroid; and Gastroparesis (09/14/2012).    She  reports that she has never smoked. She has never used smokeless tobacco. She reports that she drinks alcohol. She reports that she does not use drugs. She  reports that she currently engages in sexual activity and has had female partners. The patient  has a past surgical history that includes Cholecystectomy; Tendon repair (Left); Cesarean section; and Dilation and curettage of uterus.  Her family history includes Breast cancer in her maternal aunt; Cancer in her maternal grandmother and mother; Diabetes in her maternal grandmother; Heart disease in her father, maternal grandmother, paternal grandfather, and paternal grandmother;  Hyperlipidemia in her father, maternal grandmother, and paternal grandfather; Hypertension in her father, maternal grandmother, and paternal grandmother; Mental illness in her paternal grandfather; Mental retardation in her father; Stroke in her maternal grandmother; Thyroid cancer in her paternal aunt.  Review of Systems  Constitutional: Positive for fever and malaise/fatigue.  HENT: Positive for congestion and sore throat.   Respiratory: Positive for cough.   Cardiovascular: Positive for chest pain (with cough only). Negative for leg swelling.  Gastrointestinal: Negative for abdominal pain, diarrhea, nausea and vomiting.  Genitourinary: Negative for dysuria and frequency.  Musculoskeletal: Negative for joint pain.  Neurological: Positive for headaches (mild). Negative for dizziness and weakness.    The problem list and medications were reviewed and updated by myself where necessary and exist elsewhere in the encounter.   OBJECTIVE:  BP 120/80 (BP Location: Right Arm, Patient Position: Sitting, Cuff Size: Large)    Pulse 96    Temp 97.8 F (36.6 C) (Oral)    Resp 16    Ht 5\' 6"  (1.676 m)    Wt 282 lb (127.9 kg)    SpO2 97%    BMI 45.52 kg/m   Physical Exam  Constitutional: She is oriented to person, place, and time. She appears well-developed and well-nourished. No distress.  HENT:  Head: Normocephalic and atraumatic.  Right Ear: Tympanic membrane, external ear and ear canal normal.  Left Ear: Tympanic membrane, external ear and ear canal normal.  Nose: Nose normal. No mucosal edema. Right sinus exhibits no maxillary sinus tenderness and no frontal sinus tenderness. Left sinus exhibits no maxillary sinus tenderness and  no frontal sinus tenderness.  Mouth/Throat: Oropharynx is clear and moist. No oropharyngeal exudate, posterior oropharyngeal edema or posterior oropharyngeal erythema.  Eyes: Conjunctivae are normal. Pupils are equal, round, and reactive to light.  Neck: Neck supple.    Cardiovascular: Normal rate, regular rhythm and normal heart sounds.  Exam reveals no gallop and no friction rub.   No murmur heard. Pulmonary/Chest: Effort normal and breath sounds normal. No accessory muscle usage. She has no decreased breath sounds. She has no wheezes. She has no rhonchi. She has no rales.  Musculoskeletal: Normal range of motion.  Pt has no lower extremity edema bilat.   Neurological: She is alert and oriented to person, place, and time.  Skin: Skin is warm and dry.  Psychiatric: She has a normal mood and affect. Her behavior is normal.  Nursing note and vitals reviewed.   Results for orders placed or performed in visit on 06/19/16 (from the past 72 hour(s))  POCT CBC     Status: Abnormal   Collection Time: 06/19/16 10:25 AM  Result Value Ref Range   WBC 7.3 4.6 - 10.2 K/uL   Lymph, poc 1.7 0.6 - 3.4   POC LYMPH PERCENT 23.0 10 - 50 %L   MID (cbc) 0.5 0 - 0.9   POC MID % 7.3 0 - 12 %M   POC Granulocyte 5.1 2 - 6.9   Granulocyte percent 69.7 37 - 80 %G   RBC 3.94 (A) 4.04 - 5.48 M/uL   Hemoglobin 11.6 (A) 12.2 - 16.2 g/dL   HCT, POC 33.1 (A) 37.7 - 47.9 %   MCV 83.9 80 - 97 fL   MCH, POC 29.4 27 - 31.2 pg   MCHC 35.1 31.8 - 35.4 g/dL   RDW, POC 16.1 %   Platelet Count, POC 256 142 - 424 K/uL   MPV 7.4 0 - 99.8 fL    Dg Chest 2 View  Result Date: 06/19/2016 CLINICAL DATA:  Cough and fever for 1 month. EXAM: CHEST  2 VIEW COMPARISON:  None. FINDINGS: The heart size and mediastinal contours are within normal limits. Both lungs are clear. No large pleural effusions. The visualized skeletal structures are unremarkable. IMPRESSION: No active cardiopulmonary disease. Electronically Signed   By: Markus Daft M.D.   On: 06/19/2016 10:42    ASSESSMENT AND PLAN  [name] was seen today for cough and nasal congestion.  Diagnoses and all orders for this visit:  Cough -     DG Chest 2 View; Future -     POCT CBC -     POCT urinalysis dipstick  Sinus congestion -      POCT SEDIMENTATION RATE  Other orders -     cetirizine-pseudoephedrine (ZYRTEC-D ALLERGY & CONGESTION) 5-120 MG tablet; Take 1 tablet by mouth 2 (two) times daily. -     doxycycline (VIBRAMYCIN) 100 MG capsule; Take 1 capsule (100 mg total) by mouth 2 (two) times daily.     The patient is advised to call or return to clinic if she does not see an improvement in symptoms, or to seek the care of the closest emergency department if she worsens with the above plan.   Philis Fendt, MHS, PA-C [facility] 06/19/2016 10:50 AM  This note was scribed in my presence and I performed the services described in the this documentation.

## 2016-06-19 NOTE — Patient Instructions (Signed)
     IF you received an x-ray today, you will receive an invoice from [facility]. Please contact [facility] at [phone] with questions or concerns regarding your invoice.   IF you received labwork today, you will receive an invoice from [facility]. Please contact [facility] at [phone] with questions or concerns regarding your invoice.   Our billing staff will not be able to assist you with questions regarding bills from these companies.  You will be contacted with the lab results as soon as they are available. The fastest way to get your results is to activate your My Chart account. Instructions are located on the last page of this paperwork. If you have not heard from us regarding the results in 2 weeks, please contact this office.      

## 2016-06-22 ENCOUNTER — Telehealth: Payer: Self-pay | Admitting: Obstetrics and Gynecology

## 2016-06-25 ENCOUNTER — Encounter: Payer: Self-pay | Admitting: Physician Assistant

## 2016-06-27 ENCOUNTER — Encounter: Payer: Self-pay | Admitting: Family Medicine

## 2016-06-27 ENCOUNTER — Ambulatory Visit (INDEPENDENT_AMBULATORY_CARE_PROVIDER_SITE_OTHER): Payer: 59 | Admitting: Family Medicine

## 2016-06-27 VITALS — BP 118/86 | HR 92 | Temp 98.0°F | Resp 17 | Ht 66.0 in | Wt 278.0 lb

## 2016-06-27 DIAGNOSIS — R5383 Other fatigue: Secondary | ICD-10-CM | POA: Diagnosis not present

## 2016-06-27 DIAGNOSIS — J301 Allergic rhinitis due to pollen: Secondary | ICD-10-CM

## 2016-06-27 DIAGNOSIS — J0101 Acute recurrent maxillary sinusitis: Secondary | ICD-10-CM | POA: Diagnosis not present

## 2016-06-27 LAB — COMPREHENSIVE METABOLIC PANEL
ALT: 13 U/L (ref 6–29)
AST: 16 U/L (ref 10–30)
Albumin: 3.5 g/dL — ABNORMAL LOW (ref 3.6–5.1)
Alkaline Phosphatase: 80 U/L (ref 33–115)
BUN: 13 mg/dL (ref 7–25)
CO2: 24 mmol/L (ref 20–31)
Calcium: 8.6 mg/dL (ref 8.6–10.2)
Chloride: 103 mmol/L (ref 98–110)
Creat: 0.95 mg/dL (ref 0.50–1.10)
Glucose, Bld: 87 mg/dL (ref 65–99)
Potassium: 4.1 mmol/L (ref 3.5–5.3)
Sodium: 136 mmol/L (ref 135–146)
Total Bilirubin: 0.4 mg/dL (ref 0.2–1.2)
Total Protein: 7 g/dL (ref 6.1–8.1)

## 2016-06-27 LAB — POCT CBC
Granulocyte percent: 68.8 %G (ref 37–80)
HCT, POC: 33.3 % — AB (ref 37.7–47.9)
Hemoglobin: 11.5 g/dL — AB (ref 12.2–16.2)
Lymph, poc: 2 (ref 0.6–3.4)
MCH, POC: 29 pg (ref 27–31.2)
MCHC: 34.5 g/dL (ref 31.8–35.4)
MCV: 84.1 fL (ref 80–97)
MID (cbc): 0.5 (ref 0–0.9)
MPV: 7.7 fL (ref 0–99.8)
POC Granulocyte: 5.6 (ref 2–6.9)
POC LYMPH PERCENT: 25 %L (ref 10–50)
POC MID %: 6.2 %M (ref 0–12)
Platelet Count, POC: 244 10*3/uL (ref 142–424)
RBC: 3.96 M/uL — AB (ref 4.04–5.48)
RDW, POC: 15.7 %
WBC: 8.1 10*3/uL (ref 4.6–10.2)

## 2016-06-27 LAB — POCT SEDIMENTATION RATE: POCT SED RATE: 77 mm/hr — AB (ref 0–22)

## 2016-06-27 LAB — GLUCOSE, POCT (MANUAL RESULT ENTRY): POC Glucose: 85 mg/dl (ref 70–99)

## 2016-06-27 MED ORDER — AZELASTINE HCL 0.1 % NA SOLN: 2.0000 | Freq: Two times a day (BID) | NASAL | 12 refills | Status: DC

## 2016-06-27 MED ORDER — PREDNISONE 20 MG PO TABS: ORAL_TABLET | ORAL | 0 refills | Status: DC

## 2016-06-27 NOTE — Progress Notes (Signed)
Subjective:    Patient ID: KINYA MEINE, female    DOB: Mar 09, 1976, 40 y.o.   MRN: [id number]  06/27/2016  Follow-up (Pt states still coughing and fatigue. )   HPI This 40 y.o. female presents for eight day follow-up for persistent cough. Evaluated on 06/19/16 by PA Carlis Abbott for one month history of cough, nasal congestion, sore throat, low grade fever.  Takes Claritin for seasonal allergies; had also been taking pseudoephedrine with some relief. S/p CXR that was negative.  CBC and u/a as well as ESR obtained at visit and prescribed Zyrtec D and Doxycycline.  Sedimentation rate of 70 during visit; urine negative though concentrated; CBC WNL other than mild anemia with Hgb of 11.6.   Has been six for five weeks.  Taking Zyrtec D every morning; taking Claritin and Benadryl qhs.  Started Doxycycline day #6/10. Still very fatigued; congestion really bad but improved; taking Mucinex daily.  So tired.  Can wash tired but that it is.  Decreased appetite. Not eating much.  Trying to force fluids.   No measurable fever; taking Motrin; +sweating at night.  +HA located frontal region.  +ear pain R intermittent.  ST only with coughing.  +intermittent rhinorrhea with supine. Clear drainage; intermittent pressure in sinuses.  Cough is a little better; non-productive; thick mucous; nothing comes up.  No SOB.  No wheezing.  Diarrhea currently; having 1-2 stools per day; non-bloody stools.    Review of Systems  Constitutional: Positive for diaphoresis and fatigue. Negative for chills and fever.  HENT: Positive for congestion, ear pain, postnasal drip, rhinorrhea and voice change. Negative for facial swelling, sinus pressure, sore throat and trouble swallowing.   Respiratory: Positive for cough. Negative for shortness of breath and wheezing.   Cardiovascular: Negative for chest pain, palpitations and leg swelling.  Gastrointestinal: Negative for abdominal pain, constipation, diarrhea, nausea and vomiting.    Neurological: Positive for headaches.    Past Medical History:  Diagnosis Date  . Allergy    Zyrtec, Allegra.  . Anxiety    followed by Dr. Toy Cookey  . Depression   . Fibroid   . Gastroparesis 09/14/2012   gastric emptying study in 2014   Past Surgical History:  Procedure Laterality Date  . CESAREAN SECTION    . CHOLECYSTECTOMY    . DILATION AND CURETTAGE OF UTERUS    . TENDON REPAIR Left    Left Ankle   Allergies  Allergen Reactions  . Tamiflu [Oseltamivir Phosphate] Anaphylaxis  . Cephalosporins Rash  . Latex Rash  . Penicillins Rash    Social History   Social History  . Marital status: Married    Spouse name: N/A  . Number of children: N/A  . Years of education: N/A   Occupational History  . Not on file.   Social History Main Topics  . Smoking status: Never Smoker  . Smokeless tobacco: Never Used  . Alcohol use 0.0 oz/week     Comment: rarely - maybe 1 drink per month   . Drug use: No  . Sexual activity: Yes    Partners: Male   Other Topics Concern  . 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      Seatbelt: 100%; no texting while driving often       Family History  Problem Relation Age of Onset  . Cancer Mother     squamous cell carcinoma  . Heart disease Father     cardiomegaly, CHF  . Hyperlipidemia Father   . Hypertension Father   . Mental retardation Father   . Cancer Maternal Grandmother   . Diabetes Maternal Grandmother   . Heart disease Maternal Grandmother   . Hyperlipidemia Maternal Grandmother   . Hypertension Maternal Grandmother   . Stroke Maternal Grandmother   . Heart disease Paternal Grandmother   .  Hypertension Paternal Grandmother   . Heart disease Paternal Grandfather   . Hyperlipidemia Paternal Grandfather   . Mental illness Paternal Grandfather   . Breast cancer Maternal Aunt   . Thyroid cancer Paternal Aunt        Objective:    BP 118/86 (BP Location: Right Arm, Patient Position: Sitting, Cuff Size: Large)   Pulse 92   Temp 98 F (36.7 C) (Oral)   Resp 17   Ht _0  (1.676 m)   Wt 278 lb (126.1 kg)   SpO2 97%   BMI 44.87 kg/m  Physical Exam  Constitutional: She is oriented to person, place, and time. She appears well-developed and well-nourished. No distress.  HENT:  Head: Normocephalic and atraumatic.  Right Ear: Tympanic membrane, external ear and ear canal normal.  Left Ear: Tympanic membrane, external ear and ear canal normal.  Nose: Mucosal edema and rhinorrhea present. Right sinus exhibits no maxillary sinus tenderness and no frontal sinus tenderness. Left sinus exhibits no maxillary sinus tenderness and no frontal sinus tenderness.  Mouth/Throat: Uvula is midline and mucous membranes are normal. Posterior oropharyngeal erythema present. No oropharyngeal exudate, posterior oropharyngeal edema or tonsillar abscesses.  Eyes: Conjunctivae are normal. Pupils are equal, round, and reactive to light.  Neck: Normal range of motion. Neck supple. No thyromegaly present.  Cardiovascular: Normal rate, regular rhythm and normal heart sounds.  Exam reveals no gallop and no friction rub.   No murmur heard. Pulmonary/Chest: Effort normal and breath sounds normal. She has no wheezes. She has no rales.  Lymphadenopathy:    She has no cervical adenopathy.  Neurological: She is alert and oriented to person, place, and time.  Skin: She is not diaphoretic.  Psychiatric: She has a normal mood and affect. Her behavior is normal.  Nursing note and vitals reviewed.  Results for orders placed or performed in visit on 06/27/16  POCT CBC  Result Value Ref Range   WBC 8.1 4.6 - 10.2  K/uL   Lymph, poc 2.0 0.6 - 3.4   POC LYMPH PERCENT 25.0 10 - 50 %L   MID (cbc) 0.5 0 - 0.9   POC MID % 6.2 0 - 12 %M   POC Granulocyte 5.6 2 - 6.9   Granulocyte percent 68.8 37 - 80 %G   RBC 3.96 (A) 4.04 - 5.48 M/uL   Hemoglobin 11.5 (A) 12.2 - 16.2 g/dL   HCT, POC 33.3 (A) 37.7 - 47.9 %   MCV 84.1 80 - 97 fL   MCH, POC 29.0 27 - 31.2 pg   MCHC 34.5 31.8 - 35.4 g/dL   RDW, POC 15.7 %   Platelet Count, POC 244 142 - 424 K/uL  MPV 7.7 0 - 99.8 fL  POCT SEDIMENTATION RATE  Result Value Ref Range   POCT SED RATE 77 (A) 0 - 22 mm/hr  POCT glucose (manual entry)  Result Value Ref Range   POC Glucose 85 70 - 99 mg/dl       Assessment & Plan:   1. Acute recurrent maxillary sinusitis   2. Acute seasonal allergic rhinitis due to pollen   3. Other fatigue    -improving but fatigue persistent; repeat CBC, glucose, ESR due to elevation last week. -continue Doxy; add Prednisone; add Flonase and Astelin nasal spray. -has appointment with allergy & immunology in November with Orvil Feil. -continue Zyrtec D until gone.  Orders Placed This Encounter  Procedures  . Comprehensive metabolic panel  . POCT CBC  . POCT SEDIMENTATION RATE  . POCT glucose (manual entry)   Meds ordered this encounter  Medications  . predniSONE (DELTASONE) 20 MG tablet    Sig: Take 3 PO QAM x 1 day, 2 PO QAM x 5 days, 1 PO QAM x 5 days    Dispense:  18 tablet    Refill:  0  . azelastine (ASTELIN) 0.1 % nasal spray    Sig: Place 2 sprays into both nostrils 2 (two) times daily. Use in each nostril as directed    Dispense:  30 mL    Refill:  12    Return if symptoms worsen or fail to improve.   Kristi Elayne Guerin, M.D. [facility] [address] [place], [place]  [address] [phone] phone [phone] fax

## 2016-06-27 NOTE — Patient Instructions (Addendum)
   IF you received an x-ray today, you will receive an invoice from [facility]. Please contact [facility] at [phone] with questions or concerns regarding your invoice.   IF you received labwork today, you will receive an invoice from [facility]. Please contact [facility] at [phone] with questions or concerns regarding your invoice.   Our billing staff will not be able to assist you with questions regarding bills from these companies.  You will be contacted with the lab results as soon as they are available. The fastest way to get your results is to activate your My Chart account. Instructions are located on the last page of this paperwork. If you have not heard from us regarding the results in 2 weeks, please contact this office.    Allergic Rhinitis Allergic rhinitis is when the mucous membranes in the nose respond to allergens. Allergens are particles in the air that cause your body to have an allergic reaction. This causes you to release allergic antibodies. Through a chain of events, these eventually cause you to release histamine into the blood stream. Although meant to protect the body, it is this release of histamine that causes your discomfort, such as frequent sneezing, congestion, and an itchy, runny nose.  CAUSES Seasonal allergic rhinitis (hay fever) is caused by pollen allergens that may come from grasses, trees, and weeds. Year-round allergic rhinitis (perennial allergic rhinitis) is caused by allergens such as house dust mites, pet dander, and mold spores. SYMPTOMS  Nasal stuffiness (congestion).  Itchy, runny nose with sneezing and tearing of the eyes. DIAGNOSIS Your health care provider can help you determine the allergen or allergens that trigger your symptoms. If you and your health care provider are unable to determine the allergen, skin or blood testing may be used. Your health care provider will diagnose your  condition after taking your health history and performing a physical exam. Your health care provider may assess you for other related conditions, such as asthma, pink eye, or an ear infection. TREATMENT Allergic rhinitis does not have a cure, but it can be controlled by:  Medicines that block allergy symptoms. These may include allergy shots, nasal sprays, and oral antihistamines.  Avoiding the allergen. Hay fever may often be treated with antihistamines in pill or nasal spray forms. Antihistamines block the effects of histamine. There are over-the-counter medicines that may help with nasal congestion and swelling around the eyes. Check with your health care provider before taking or giving this medicine. If avoiding the allergen or the medicine prescribed do not work, there are many new medicines your health care provider can prescribe. Stronger medicine may be used if initial measures are ineffective. Desensitizing injections can be used if medicine and avoidance does not work. Desensitization is when a patient is given ongoing shots until the body becomes less sensitive to the allergen. Make sure you follow up with your health care provider if problems continue. HOME CARE INSTRUCTIONS It is not possible to completely avoid allergens, but you can reduce your symptoms by taking steps to limit your exposure to them. It helps to know exactly what you are allergic to so that you can avoid your specific triggers. SEEK MEDICAL CARE IF:  You have a fever.  You develop a cough that does not stop easily (persistent).  You have shortness of breath.  You start wheezing.  Symptoms interfere with normal daily activities.   This information is not intended to replace advice given to you by your   health care provider. Make sure you discuss any questions you have with your health care provider.   Document Released: 05/26/2001 Document Revised: 09/21/2014 Document Reviewed: 05/08/2013 Elsevier Interactive  Patient Education 2016 [employer]  

## 2016-07-01 MED FILL — clonazePAM 1 MG TABS: 1 | 30 days supply | Qty: 60 | Fill #2

## 2016-07-08 NOTE — Telephone Encounter (Signed)
error 

## 2016-07-11 DIAGNOSIS — F4310 Post-traumatic stress disorder, unspecified: Secondary | ICD-10-CM | POA: Diagnosis not present

## 2016-07-11 DIAGNOSIS — F411 Generalized anxiety disorder: Secondary | ICD-10-CM | POA: Diagnosis not present

## 2016-07-11 DIAGNOSIS — F3489 Other specified persistent mood disorders: Secondary | ICD-10-CM | POA: Diagnosis not present

## 2016-07-15 DIAGNOSIS — H01006 Unspecified blepharitis left eye, unspecified eyelid: Secondary | ICD-10-CM | POA: Diagnosis not present

## 2016-07-15 MED FILL — NEO/POLY/DEXAMET EYE OINT: 3.5-10000-0 | 10 days supply | Qty: 4 | Fill #0

## 2016-07-16 MED FILL — QUETIAPINE FUMARATE 400 MG: 400 | 30 days supply | Qty: 45 | Fill #5

## 2016-07-16 MED FILL — L-METHYLFOLATE CALCIUM 15 M: 15 | 30 days supply | Qty: 30 | Fill #8

## 2016-07-16 MED FILL — lamoTRIgine 200 MG TABS: 200 | 30 days supply | Qty: 30 | Fill #6

## 2016-07-22 ENCOUNTER — Ambulatory Visit: Payer: 59

## 2016-07-27 DIAGNOSIS — J309 Allergic rhinitis, unspecified: Secondary | ICD-10-CM | POA: Diagnosis not present

## 2016-07-27 DIAGNOSIS — L209 Atopic dermatitis, unspecified: Secondary | ICD-10-CM | POA: Diagnosis not present

## 2016-07-27 MED FILL — MONTELUKAST SOD 10 MG TAB: 10 | 30 days supply | Qty: 30 | Fill #0

## 2016-07-27 MED FILL — DYMISTA NASAL SPRAY: 137-50 | 30 days supply | Qty: 23 | Fill #0

## 2016-07-27 MED FILL — TRIAMCINOLONE 0.1% OINTMENT: 0.1 | 30 days supply | Qty: 80 | Fill #0

## 2016-07-27 MED FILL — SULFAMETHOXAZOLE/TMP DS TAB: 800-160 | 10 days supply | Qty: 20 | Fill #0

## 2016-07-28 DIAGNOSIS — H2189 Other specified disorders of iris and ciliary body: Secondary | ICD-10-CM | POA: Diagnosis not present

## 2016-07-28 DIAGNOSIS — H01006 Unspecified blepharitis left eye, unspecified eyelid: Secondary | ICD-10-CM | POA: Diagnosis not present

## 2016-07-28 DIAGNOSIS — B300 Keratoconjunctivitis due to adenovirus: Secondary | ICD-10-CM | POA: Diagnosis not present

## 2016-07-28 MED FILL — TOBRAMYCIN 0.3% EYE DROPS: 0.3 | 25 days supply | Qty: 5 | Fill #0

## 2016-07-28 MED FILL — NEO/POLY/DEXAMET EYE OINT: 3.5-10000-0 | 10 days supply | Qty: 4 | Fill #0

## 2016-08-03 MED FILL — clonazePAM 1 MG TABS: 1 | 30 days supply | Qty: 60 | Fill #3

## 2016-08-13 MED FILL — L-METHYLFOLATE CALCIUM 15 M: 15 | 30 days supply | Qty: 30 | Fill #9

## 2016-08-13 MED FILL — lamoTRIgine 200 MG TABS: 200 | 30 days supply | Qty: 30 | Fill #7

## 2016-08-13 MED FILL — QUETIAPINE FUMARATE 400 MG: 400 | 30 days supply | Qty: 45 | Fill #6

## 2016-08-14 MED FILL — PROPRANOLOL 40 MG TABLET: 40 | 30 days supply | Qty: 60 | Fill #0

## 2016-08-27 ENCOUNTER — Encounter (INDEPENDENT_AMBULATORY_CARE_PROVIDER_SITE_OTHER): Payer: 59 | Admitting: Family Medicine

## 2016-08-29 DIAGNOSIS — F3489 Other specified persistent mood disorders: Secondary | ICD-10-CM | POA: Diagnosis not present

## 2016-08-31 ENCOUNTER — Ambulatory Visit (INDEPENDENT_AMBULATORY_CARE_PROVIDER_SITE_OTHER): Payer: 59 | Admitting: Family Medicine

## 2016-08-31 ENCOUNTER — Encounter (INDEPENDENT_AMBULATORY_CARE_PROVIDER_SITE_OTHER): Payer: Self-pay | Admitting: Family Medicine

## 2016-08-31 VITALS — BP 132/83 | HR 93 | Temp 98.0°F | Resp 15 | Ht 65.0 in | Wt 278.0 lb

## 2016-08-31 DIAGNOSIS — E559 Vitamin D deficiency, unspecified: Secondary | ICD-10-CM | POA: Diagnosis not present

## 2016-08-31 DIAGNOSIS — Z1389 Encounter for screening for other disorder: Secondary | ICD-10-CM

## 2016-08-31 DIAGNOSIS — D508 Other iron deficiency anemias: Secondary | ICD-10-CM | POA: Diagnosis not present

## 2016-08-31 DIAGNOSIS — R5383 Other fatigue: Secondary | ICD-10-CM

## 2016-08-31 DIAGNOSIS — R0600 Dyspnea, unspecified: Secondary | ICD-10-CM

## 2016-08-31 DIAGNOSIS — Z9189 Other specified personal risk factors, not elsewhere classified: Secondary | ICD-10-CM | POA: Diagnosis not present

## 2016-08-31 DIAGNOSIS — Z1331 Encounter for screening for depression: Secondary | ICD-10-CM

## 2016-08-31 DIAGNOSIS — R0609 Other forms of dyspnea: Secondary | ICD-10-CM

## 2016-08-31 NOTE — Progress Notes (Signed)
Office: [phone]  /  Fax: [phone]   HPI:   Chief Complaint: OBESITY  Caroline Gonzales (MR# [id number]) is a 40 y.o. female who presents on 09/02/2016 for obesity evaluation and treatment. Current BMI is Body mass index is 46.26 kg/m.Caroline Gonzales has struggled with obesity for years and has been unsuccessful in either losing weight or maintaining long term weight loss. Caroline Gonzales attended our information session and states she is currently in the action stage of change and ready to dedicate time achieving and maintaining a healthier weight.  Caroline Gonzales states she struggles with family and or coworkers weight loss sabotage her desired weight is 200 lbs she started gaining weight after divorce her heaviest weight ever is now she has significant food cravings issues  she frequently makes poor food choices she struggles with emotional eating    Fatigue Caroline Gonzales feels her energy is lower than it should be. This has worsened with weight gain and has not worsened recently. Caroline Gonzales reports daytime somnolence and  reports waking up still tired. Patient is at risk for obstructive sleep apnea. Patent has a history of symptoms of morning fatigue. Patient generally gets 7 or 8 hours of sleep per night. Snoring is present. Apneic episodes are not present. Epworth Sleepiness Score is 2  Dyspnea on exertion Caroline Gonzales notes increasing shortness of breath with exercising and seems to be worsening over time with weight gain. She notes getting out of breath sooner with activity than she used to. This has not gotten worse recently. Caroline Gonzales denies shortness of breath at rest or orthopnea.  Vit D deficiency Pt has a history of vit D deficiency but is not currently on vit D. Due for labs. + fatigue  Anemia Pt has a hx of unspecified anemia, + fatigue  Depression Screen Caroline Gonzales's Food and Mood (modified PHQ-9) score was  Depression screen PHQ 2/9 08/31/2016  Decreased Interest 1  Down, Depressed, Hopeless 3    PHQ - 2 Score 4  Altered sleeping 1  Tired, decreased energy 3  Change in appetite 3  Feeling bad or failure about yourself  3  Trouble concentrating 0  Moving slowly or fidgety/restless 2  Suicidal thoughts 1  PHQ-9 Score 17      ALLERGIES: Allergies  Allergen Reactions  . Tamiflu [Oseltamivir Phosphate] Anaphylaxis  . Cephalosporins Rash  . Latex Rash  . Penicillins Rash    MEDICATIONS: Current Outpatient Prescriptions on File Prior to Visit  Medication Sig Dispense Refill  . cetirizine-pseudoephedrine (ZYRTEC-D ALLERGY & CONGESTION) 5-120 MG tablet Take 1 tablet by mouth 2 (two) times daily. 14 tablet 0  . clonazePAM (KLONOPIN) 1 MG tablet Take 1 mg by mouth 2 (two) times daily as needed for anxiety.    [name] ibuprofen (ADVIL,MOTRIN) 100 MG/5ML suspension Take 200 mg by mouth every 4 (four) hours as needed.    . L-methylfolate Calcium 15 MG TABS Take 15 m by mouth daily.    [name] lamoTRIgine (LAMICTAL) 200 MG tablet Take 200 mg by mouth at bedtime.    [name] levonorgestrel (MIRENA) 20 MCG/24HR IUD 1 each by Intrauterine route once.    . loratadine (CLARITIN) 10 MG tablet Take 10 mg by mouth daily.    . propranolol (INDERAL) 40 MG tablet Take 40 mg by mouth 2 (two) times daily.    . QUEtiapine (SEROQUEL) 400 MG tablet Take 400 mg by mouth at bedtime. Takes 1.5 tablets PO QD- total 600mg     . ranitidine (ZANTAC) 150 MG tablet Take 150 mg by  mouth at bedtime.    [name] azelastine (ASTELIN) 0.1 % nasal spray Place 2 sprays into both nostrils 2 (two) times daily. Use in each nostril as directed (Patient not taking: Reported on 08/31/2016) 30 mL 12  . HYDROcodone-acetaminophen (NORCO/VICODIN) 5-325 MG tablet Take 1 tablet by mouth every 6 (six) hours as needed for moderate pain.    . predniSONE (DELTASONE) 20 MG tablet Take 3 PO QAM x 1 day, 2 PO QAM x 5 days, 1 PO QAM x 5 days (Patient not taking: Reported on 08/31/2016) 18 tablet 0   No current facility-administered medications on file prior to  visit.     PAST MEDICAL HISTORY: Past Medical History:  Diagnosis Date  . Allergy    Zyrtec, Allegra.  . Anxiety    followed by Dr. Toy Cookey  . Back pain   . Bipolar 1 disorder ([place])   . Depression   . Fibroid   . Gallbladder problem   . Gastroparesis 09/14/2012   gastric emptying study in 2014  . GERD (gastroesophageal reflux disease)   . Vitamin D deficiency     PAST SURGICAL HISTORY: Past Surgical History:  Procedure Laterality Date  . CESAREAN SECTION    . CHOLECYSTECTOMY    . DILATION AND CURETTAGE OF UTERUS    . TENDON REPAIR Left    Left Ankle    SOCIAL HISTORY: Social History  Substance Use Topics  . Smoking status: Former Smoker    Types: Cigarettes    Quit date: 2003  . Smokeless tobacco: Never Used  . Alcohol use 0.0 oz/week     Comment: rarely - maybe 1 drink per month     FAMILY HISTORY: Family History  Problem Relation Age of Onset  . Cancer Mother     squamous cell carcinoma  . Hyperlipidemia Mother   . Depression Mother   . Anxiety disorder Mother   . Obesity Mother   . Heart disease Father     cardiomegaly, CHF  . Hyperlipidemia Father   . Hypertension Father   . Mental retardation Father   . High blood pressure Father   . Depression Father   . Anxiety disorder Father   . Obesity Father   . Cancer Maternal Grandmother   . Diabetes Maternal Grandmother   . Heart disease Maternal Grandmother   . Hyperlipidemia Maternal Grandmother   . Hypertension Maternal Grandmother   . Stroke Maternal Grandmother   . Heart disease Paternal Grandmother   . Hypertension Paternal Grandmother   . Heart disease Paternal Grandfather   . Hyperlipidemia Paternal Grandfather   . Mental illness Paternal Grandfather   . Breast cancer Maternal Aunt   . Thyroid cancer Paternal Aunt     ROS: Review of Systems  Constitutional: Positive for malaise/fatigue.       Trouble Sleeping  HENT: Positive for sinus pain.        Stuffiness, Discharge  Eyes:        Wear glasses or contacts   Respiratory: Positive for shortness of breath.   Gastrointestinal: Positive for heartburn.  Musculoskeletal: Positive for back pain.  Skin: Positive for itching and rash.  Psychiatric/Behavioral: Positive for depression. The patient is nervous/anxious.        Stress    PHYSICAL EXAM: Blood pressure 132/83, pulse 93, temperature 98 F (36.7 C), temperature source Oral, resp. rate 15, height 5\' 5"  (1.651 m), weight 278 lb (126.1 kg), SpO2 97 %. Body mass index is 46.26 kg/m. Physical Exam  Constitutional: She  is oriented to person, place, and time. She appears well-developed and well-nourished.  HENT:  Head: Normocephalic and atraumatic.  Nose: Nose normal.  Eyes: EOM are normal. No scleral icterus.  Neck: Normal range of motion. Neck supple. No thyromegaly present.  Cardiovascular: Normal rate and regular rhythm.   Pulmonary/Chest: Effort normal and breath sounds normal.  Abdominal: Soft. There is no tenderness.  Musculoskeletal: Normal range of motion. She exhibits edema.  Neurological: She is alert and oriented to person, place, and time. Coordination normal.  Skin: Skin is warm and dry.  Psychiatric: She has a normal mood and affect. Her behavior is normal.  Vitals reviewed.   RECENT LABS AND TESTS: BMET    Component Value Date/Time   NA 138 08/31/2016 1644   K 4.6 08/31/2016 1644   CL 98 08/31/2016 1644   CO2 23 08/31/2016 1644   GLUCOSE 80 08/31/2016 1644   GLUCOSE 87 06/27/2016 0933   BUN 9 08/31/2016 1644   CREATININE 1.00 08/31/2016 1644   CREATININE 0.95 06/27/2016 0933   CALCIUM 9.3 08/31/2016 1644   GFRNONAA 71 08/31/2016 1644   GFRAA 81 08/31/2016 1644   Lab Results  Component Value Date   HGBA1C 5.7 (H) 08/31/2016   Lab Results  Component Value Date   INSULIN 17.4 08/31/2016   CBC    Component Value Date/Time   WBC 7.9 08/31/2016 1644   WBC 8.1 06/27/2016 0941   WBC 9.9 04/30/2016 0947   RBC 4.31 08/31/2016 1644    RBC 3.96 (A) 06/27/2016 0941   RBC 4.15 04/30/2016 0947   HGB 11.5 (A) 06/27/2016 0941   HGB 11.7 04/30/2016 0947   HCT 35.3 08/31/2016 1644   PLT 300 04/30/2016 0947   MCV 82 08/31/2016 1644   MCH 26.5 (L) 08/31/2016 1644   MCH 29.0 06/27/2016 0941   MCH 28.2 04/30/2016 0947   MCHC 32.3 08/31/2016 1644   MCHC 34.5 06/27/2016 0941   MCHC 33.2 04/30/2016 0947   RDW 15.5 (H) 08/31/2016 1644   LYMPHSABS 2.5 08/31/2016 1644   MONOABS 891 04/30/2016 0947   EOSABS 0.1 08/31/2016 1644   BASOSABS 0.0 08/31/2016 1644   Iron/TIBC/Ferritin/ %Sat    Component Value Date/Time   IRON 53 08/31/2016 1644   TIBC 280 08/31/2016 1644   FERRITIN 36 08/31/2016 1644   IRONPCTSAT 19 08/31/2016 1644   Lipid Panel     Component Value Date/Time   CHOL 148 08/31/2016 1644   TRIG 161 (H) 08/31/2016 1644   HDL 44 08/31/2016 1644   CHOLHDL 4.1 12/01/2015 0914   VLDL 37 (H) 12/01/2015 0914   LDLCALC 72 08/31/2016 1644   Hepatic Function Panel     Component Value Date/Time   PROT 7.4 08/31/2016 1644   ALBUMIN 4.0 08/31/2016 1644   AST 24 08/31/2016 1644   ALT 18 08/31/2016 1644   ALKPHOS 90 08/31/2016 1644   BILITOT 0.2 08/31/2016 1644      Component Value Date/Time   TSH 1.620 08/31/2016 1644   TSH 1.83 12/01/2015 0914    ECG done today shows NSR @ 88 BPM INDIRECT CALORIMETER done today shows a VO2 of 352 and a REE of 2446.    ASSESSMENT AND PLAN: Other fatigue - Plan: EKG 12-Lead, Comprehensive metabolic panel, Hemoglobin A1c, Insulin, random, Lipid Panel With LDL/HDL Ratio, Vitamin B12, Folate, T3, T4, free, TSH  Dyspnea on exertion  Vitamin D deficiency - Plan: VITAMIN D 25 Hydroxy (Vit-D Deficiency, Fractures)  Other iron deficiency anemia - Plan:  CBC With Differential, Anemia panel  At risk for diabetes mellitus  Screening for diabetes mellitus  Depression screen  Morbid obesity ([place])  PLAN:  Fatigue Caroline Gonzales was informed that her fatigue may be related to obesity,  depression or many other causes. Labs will be ordered, and in the meanwhile Caroline Gonzales has agreed to work on diet, exercise and weight loss to help with fatigue. Proper sleep hygiene was discussed including the need for 7-8 hours of quality sleep each night. A sleep study was not ordered based on symptoms and Epworth score.  Exercise Induced Shortness of Breath Caroline Gonzales's shortness of breath appears to be obesity related and exercise induced. She has agreed to work on weight loss and gradually increase exercise to treat her exercise induced shortness of breath. If Johnanna follows our instructions and loses weight without improvement of her shortness of breath, we will plan to refer to pulmonology. We will monitor this condition regularly. Caroline Gonzales agrees to this plan.  Vitamin D Deficiency Wylla was informed that low vitamin D levels contributes to fatigue and are associated with obesity, breast, and colon cancer. Will check labs and follow.  Anemia Will check labs and follow.  DM prevention counselling Caroline Gonzales was given extended (at least 15 minutes) diabetes prevention counseling today. She is 40 y.o. female and has risk factors for diabetes including obesity and on antipsychotics. We discussed intensive lifestyle modifications today with an emphasis on weight loss as well as increasing exercise and decreasing simple carbohydrates in her diet.  Depression Screen Caroline Gonzales had a very high positive depression screening. Depression is commonly associated with obesity and often results in emotional eating behaviors. She has a history of bipolar depression on multiple meds.We will monitor this closely and work on CBT to help improve the non-hunger eating patterns. She is being followed by Psych currently and we will monitor for mood lability as we work with her.  Obesity Caroline Gonzales is currently in the action stage of change and her goal is to continue with weight loss efforts She has agreed to follow the  Category 3 plan Caroline Gonzales has been instructed to work up to a goal of 150 minutes of combined cardio and strengthening exercise per week for weight loss and overall health benefits. We discussed the following Behavioral Modification Stratagies today: increasing lean protein intake, increasing vegetables, decrease eating out, work on meal planning and easy cooking plans and holiday eating strategies   Caroline Gonzales has agreed to follow up with our clinic in 2 weeks. She was informed of the importance of frequent follow up visits to maximize her success with intensive lifestyle modifications for her multiple health conditions. She was informed we would discuss her lab results at her next visit unless there is a critical issue that needs to be addressed sooner. Caroline Gonzales agreed to keep her next visit at the agreed upon time to discuss these results.  I, Doreene Nest, am acting as scribe for Dennard Nip, MD  I have reviewed the above documentation for accuracy and completeness, and I agree with the above. -Dennard Nip, MD

## 2016-09-01 ENCOUNTER — Encounter (INDEPENDENT_AMBULATORY_CARE_PROVIDER_SITE_OTHER): Payer: Self-pay | Admitting: Family Medicine

## 2016-09-01 LAB — LIPID PANEL WITH LDL/HDL RATIO
Cholesterol, Total: 148 mg/dL (ref 100–199)
HDL: 44 mg/dL (ref >39)
LDL Calculated: 72 mg/dL (ref 0–99)
LDl/HDL Ratio: 1.6 ratio units (ref 0.0–3.2)
Triglycerides: 161 mg/dL — ABNORMAL HIGH (ref 0–149)
VLDL Cholesterol Cal: 32 mg/dL (ref 5–40)

## 2016-09-01 LAB — CBC WITH DIFFERENTIAL
Basophils Absolute: 0 10*3/uL (ref 0.0–0.2)
Basos: 0 %
EOS (ABSOLUTE): 0.1 10*3/uL (ref 0.0–0.4)
Eos: 1 %
Hemoglobin: 11.4 g/dL (ref 11.1–15.9)
Immature Grans (Abs): 0 10*3/uL (ref 0.0–0.1)
Immature Granulocytes: 0 %
Lymphocytes Absolute: 2.5 10*3/uL (ref 0.7–3.1)
Lymphs: 31 %
MCH: 26.5 pg — ABNORMAL LOW (ref 26.6–33.0)
MCHC: 32.3 g/dL (ref 31.5–35.7)
MCV: 82 fL (ref 79–97)
Monocytes Absolute: 0.5 10*3/uL (ref 0.1–0.9)
Monocytes: 7 %
Neutrophils Absolute: 4.7 10*3/uL (ref 1.4–7.0)
Neutrophils: 61 %
RBC: 4.31 x10E6/uL (ref 3.77–5.28)
RDW: 15.5 % — ABNORMAL HIGH (ref 12.3–15.4)
WBC: 7.9 10*3/uL (ref 3.4–10.8)

## 2016-09-01 LAB — HEMOGLOBIN A1C
Est. average glucose Bld gHb Est-mCnc: 117 mg/dL
Hgb A1c MFr Bld: 5.7 % — ABNORMAL HIGH (ref 4.8–5.6)

## 2016-09-01 LAB — ANEMIA PANEL
Ferritin: 36 ng/mL (ref 15–150)
Folate, Hemolysate: >620 ng/mL
Folate, RBC: >1756 ng/mL (ref >498)
Hematocrit: 35.3 % (ref 34.0–46.6)
Iron Saturation: 19 % (ref 15–55)
Iron: 53 ug/dL (ref 27–159)
Retic Ct Pct: 2.3 % (ref 0.6–2.6)
Total Iron Binding Capacity: 280 ug/dL (ref 250–450)
UIBC: 227 ug/dL (ref 131–425)
Vitamin B-12: 479 pg/mL (ref 232–1245)

## 2016-09-01 LAB — VITAMIN D 25 HYDROXY (VIT D DEFICIENCY, FRACTURES): Vit D, 25-Hydroxy: 59.3 ng/mL (ref 30.0–100.0)

## 2016-09-01 LAB — COMPREHENSIVE METABOLIC PANEL
ALT: 18 IU/L (ref 0–32)
AST: 24 IU/L (ref 0–40)
Albumin/Globulin Ratio: 1.2 (ref 1.2–2.2)
Albumin: 4 g/dL (ref 3.5–5.5)
Alkaline Phosphatase: 90 IU/L (ref 39–117)
BUN/Creatinine Ratio: 9 (ref 9–23)
BUN: 9 mg/dL (ref 6–24)
Bilirubin Total: 0.2 mg/dL (ref 0.0–1.2)
CO2: 23 mmol/L (ref 18–29)
Calcium: 9.3 mg/dL (ref 8.7–10.2)
Chloride: 98 mmol/L (ref 96–106)
Creatinine, Ser: 1 mg/dL (ref 0.57–1.00)
GFR calc Af Amer: 81 mL/min/{1.73_m2} (ref >59)
GFR calc non Af Amer: 71 mL/min/{1.73_m2} (ref >59)
Globulin, Total: 3.4 g/dL (ref 1.5–4.5)
Glucose: 80 mg/dL (ref 65–99)
Potassium: 4.6 mmol/L (ref 3.5–5.2)
Sodium: 138 mmol/L (ref 134–144)
Total Protein: 7.4 g/dL (ref 6.0–8.5)

## 2016-09-01 LAB — T4, FREE: Free T4: 1.06 ng/dL (ref 0.82–1.77)

## 2016-09-01 LAB — FOLATE: Folate: >20 ng/mL (ref >3.0)

## 2016-09-01 LAB — T3: T3, Total: 109 ng/dL (ref 71–180)

## 2016-09-01 LAB — INSULIN, RANDOM: INSULIN: 17.4 u[IU]/mL (ref 2.6–24.9)

## 2016-09-01 LAB — TSH: TSH: 1.62 u[IU]/mL (ref 0.450–4.500)

## 2016-09-01 MED FILL — clonazePAM 1 MG TABS: 1 | 30 days supply | Qty: 60 | Fill #4

## 2016-09-02 DIAGNOSIS — J300 Vasomotor rhinitis: Secondary | ICD-10-CM | POA: Diagnosis not present

## 2016-09-02 DIAGNOSIS — L209 Atopic dermatitis, unspecified: Secondary | ICD-10-CM | POA: Diagnosis not present

## 2016-09-02 DIAGNOSIS — J3089 Other allergic rhinitis: Secondary | ICD-10-CM | POA: Diagnosis not present

## 2016-09-11 MED FILL — QUETIAPINE FUMARATE 400 MG: 400 | 30 days supply | Qty: 45 | Fill #7

## 2016-09-11 MED FILL — lamoTRIgine 200 MG TABS: 200 | 30 days supply | Qty: 30 | Fill #8

## 2016-09-11 MED FILL — L-METHYLFOLATE CALCIUM 15 M: 15 | 30 days supply | Qty: 30 | Fill #10

## 2016-09-11 MED FILL — PROPRANOLOL 40 MG TABLET: 40 | 30 days supply | Qty: 60 | Fill #1

## 2016-09-12 DIAGNOSIS — F3489 Other specified persistent mood disorders: Secondary | ICD-10-CM | POA: Diagnosis not present

## 2016-09-16 ENCOUNTER — Ambulatory Visit (INDEPENDENT_AMBULATORY_CARE_PROVIDER_SITE_OTHER): Payer: 59 | Admitting: Family Medicine

## 2016-09-16 VITALS — BP 115/77 | Ht 65.0 in | Wt 271.0 lb

## 2016-09-16 DIAGNOSIS — E7800 Pure hypercholesterolemia, unspecified: Secondary | ICD-10-CM | POA: Diagnosis not present

## 2016-09-16 DIAGNOSIS — Z9189 Other specified personal risk factors, not elsewhere classified: Secondary | ICD-10-CM

## 2016-09-16 DIAGNOSIS — R7303 Prediabetes: Secondary | ICD-10-CM | POA: Diagnosis not present

## 2016-09-16 NOTE — Progress Notes (Signed)
Office: [phone]  /  Fax: [phone]   HPI:   Chief Complaint: OBESITY Caroline Gonzales is here to discuss her progress with her obesity treatment plan. She is following her eating plan approximately 95 % of the time and states she is exercising 15 minutes 4-5 times per week. Caroline Gonzales is currently struggling with family sabotage. Caroline Gonzales has done well with weight loss, kept a food journal and did well with protein but daily kcal 1200. Overate on Christmas but got back on track the next day. Her weight is 271 lb (122.9 kg) today and has had a weight loss of 7 pounds over a period of 2 weeks since her last visit. She has lost 7 lbs since starting treatment with [name].  Pre-Diabetes Caroline Gonzales has a diagnosis of prediabetes based on her elevated HgA1c at 5.7, patient notes significant polyphagia, worse in PM and was informed this puts her at greater risk of developing diabetes. She is not taking metformin currently and continues to work on diet and exercise to decrease risk of diabetes. She denies nausea or hypoglycemia.  Hyperlipidemia Caroline Gonzales has hyperlipidemia and has been trying to improve her cholesterol levels with intensive lifestyle modification including a low saturated fat diet, exercise and weight loss. She denies any chest pain, claudication or myalgias. Triglycerides are slightly elevated.  Wt Readings from Last 500 Encounters:  09/16/16 271 lb (122.9 kg)  08/31/16 278 lb (126.1 kg)  06/27/16 278 lb (126.1 kg)  06/19/16 282 lb (127.9 kg)  05/13/16 275 lb 6.4 oz (124.9 kg)  04/30/16 279 lb (126.6 kg)  04/29/16 278 lb (126.1 kg)  12/12/15 269 lb (122 kg)  12/01/15 270 lb 6.4 oz (122.7 kg)     ALLERGIES: Allergies  Allergen Reactions  . Tamiflu [Oseltamivir Phosphate] Anaphylaxis  . Cephalosporins Rash  . Latex Rash  . Penicillins Rash    MEDICATIONS: Current Outpatient Prescriptions on File Prior to Visit  Medication Sig Dispense Refill  . Azelastine-Fluticasone 137-50  MCG/ACT SUSP Place 2 sprays into the nose 2 (two) times daily.    . cholecalciferol (VITAMIN D) 1000 units tablet Take 5,000 Units by mouth daily.    . clonazePAM (KLONOPIN) 1 MG tablet Take 1 mg by mouth 2 (two) times daily as needed for anxiety.    . L-methylfolate Calcium 15 MG TABS Take 15 m by mouth daily.    [name] lamoTRIgine (LAMICTAL) 200 MG tablet Take 200 mg by mouth at bedtime.    [name] levonorgestrel (MIRENA) 20 MCG/24HR IUD 1 each by Intrauterine route once.    . loratadine (CLARITIN) 10 MG tablet Take 10 mg by mouth daily.    . Multiple Vitamin (MULTIVITAMIN) tablet Take 1 tablet by mouth Nightly.    . propranolol (INDERAL) 40 MG tablet Take 40 mg by mouth 2 (two) times daily.    . QUEtiapine (SEROQUEL) 400 MG tablet Take 400 mg by mouth at bedtime. Takes 1.5 tablets PO QD- total 600mg     . ranitidine (ZANTAC) 150 MG tablet Take 150 mg by mouth at bedtime.     No current facility-administered medications on file prior to visit.     PAST MEDICAL HISTORY: Past Medical History:  Diagnosis Date  . Allergy    Zyrtec, Allegra.  . Anxiety    followed by Dr. Toy Cookey  . Back pain   . Bipolar 1 disorder ([place])   . Depression   . Fibroid   . Gallbladder problem   . Gastroparesis 09/14/2012   gastric emptying study in 2014  .  GERD (gastroesophageal reflux disease)   . Vitamin D deficiency     PAST SURGICAL HISTORY: Past Surgical History:  Procedure Laterality Date  . CESAREAN SECTION    . CHOLECYSTECTOMY    . DILATION AND CURETTAGE OF UTERUS    . TENDON REPAIR Left    Left Ankle    SOCIAL HISTORY: Social History  Substance Use Topics  . Smoking status: Former Smoker    Types: Cigarettes    Quit date: 2003  . Smokeless tobacco: Never Used  . Alcohol use 0.0 oz/week     Comment: rarely - maybe 1 drink per month     FAMILY HISTORY: Family History  Problem Relation Age of Onset  . Cancer Mother     squamous cell carcinoma  . Hyperlipidemia Mother   . Depression Mother     . Anxiety disorder Mother   . Obesity Mother   . Heart disease Father     cardiomegaly, CHF  . Hyperlipidemia Father   . Hypertension Father   . Mental retardation Father   . High blood pressure Father   . Depression Father   . Anxiety disorder Father   . Obesity Father   . Cancer Maternal Grandmother   . Diabetes Maternal Grandmother   . Heart disease Maternal Grandmother   . Hyperlipidemia Maternal Grandmother   . Hypertension Maternal Grandmother   . Stroke Maternal Grandmother   . Heart disease Paternal Grandmother   . Hypertension Paternal Grandmother   . Heart disease Paternal Grandfather   . Hyperlipidemia Paternal Grandfather   . Mental illness Paternal Grandfather   . Breast cancer Maternal Aunt   . Thyroid cancer Paternal Aunt     ROS: Review of Systems  Constitutional: Positive for weight loss.  Endo/Heme/Allergies:       Polyphagia     PHYSICAL EXAM: Blood pressure 115/77, height 5\' 5"  (1.651 m), weight 271 lb (122.9 kg). Body mass index is 45.1 kg/m. Physical Exam  Constitutional: She is oriented to person, place, and time. She appears well-developed and well-nourished.  HENT:  Head: Normocephalic and atraumatic.  Nose: Nose normal.  Eyes: EOM are normal. No scleral icterus.  Neck: Normal range of motion. Neck supple. No thyromegaly present.  Cardiovascular: Normal rate and regular rhythm.   Abdominal: Soft. There is no tenderness.  Obesity  Musculoskeletal: Normal range of motion. She exhibits edema.  Neurological: She is alert and oriented to person, place, and time. Coordination normal.  Skin: Skin is warm and dry.  Psychiatric: She has a normal mood and affect. Her behavior is normal.    RECENT LABS AND TESTS: BMET    Component Value Date/Time   NA 138 08/31/2016 1644   K 4.6 08/31/2016 1644   CL 98 08/31/2016 1644   CO2 23 08/31/2016 1644   GLUCOSE 80 08/31/2016 1644   GLUCOSE 87 06/27/2016 0933   BUN 9 08/31/2016 1644   CREATININE  1.00 08/31/2016 1644   CREATININE 0.95 06/27/2016 0933   CALCIUM 9.3 08/31/2016 1644   GFRNONAA 71 08/31/2016 1644   GFRAA 81 08/31/2016 1644   Lab Results  Component Value Date   HGBA1C 5.7 (H) 08/31/2016   HGBA1C 5.6 12/01/2015   Lab Results  Component Value Date   INSULIN 17.4 08/31/2016   CBC    Component Value Date/Time   WBC 7.9 08/31/2016 1644   WBC 8.1 06/27/2016 0941   WBC 9.9 04/30/2016 0947   RBC 4.31 08/31/2016 1644   RBC 3.96 (A) 06/27/2016  0941   RBC 4.15 04/30/2016 0947   HGB 11.5 (A) 06/27/2016 0941   HGB 11.7 04/30/2016 0947   HCT 35.3 08/31/2016 1644   PLT 300 04/30/2016 0947   MCV 82 08/31/2016 1644   MCH 26.5 (L) 08/31/2016 1644   MCH 29.0 06/27/2016 0941   MCH 28.2 04/30/2016 0947   MCHC 32.3 08/31/2016 1644   MCHC 34.5 06/27/2016 0941   MCHC 33.2 04/30/2016 0947   RDW 15.5 (H) 08/31/2016 1644   LYMPHSABS 2.5 08/31/2016 1644   MONOABS 891 04/30/2016 0947   EOSABS 0.1 08/31/2016 1644   BASOSABS 0.0 08/31/2016 1644   Iron/TIBC/Ferritin/ %Sat    Component Value Date/Time   IRON 53 08/31/2016 1644   TIBC 280 08/31/2016 1644   FERRITIN 36 08/31/2016 1644   IRONPCTSAT 19 08/31/2016 1644   Lipid Panel     Component Value Date/Time   CHOL 148 08/31/2016 1644   TRIG 161 (H) 08/31/2016 1644   HDL 44 08/31/2016 1644   CHOLHDL 4.1 12/01/2015 0914   VLDL 37 (H) 12/01/2015 0914   LDLCALC 72 08/31/2016 1644   Hepatic Function Panel     Component Value Date/Time   PROT 7.4 08/31/2016 1644   ALBUMIN 4.0 08/31/2016 1644   AST 24 08/31/2016 1644   ALT 18 08/31/2016 1644   ALKPHOS 90 08/31/2016 1644   BILITOT 0.2 08/31/2016 1644      Component Value Date/Time   TSH 1.620 08/31/2016 1644   TSH 1.83 12/01/2015 0914    ASSESSMENT AND PLAN: Prediabetes - Plan: metFORMIN (GLUCOPHAGE) 500 MG tablet  Pure hypercholesterolemia  At risk for diabetes mellitus - Plan: metFORMIN (GLUCOPHAGE) 500 MG tablet  Morbid obesity  ([place])  PLAN:  Pre-Diabetes Caroline Gonzales will continue to work on weight loss, exercise, and decreasing simple carbohydrates in her diet to help decrease the risk of diabetes. We dicussed metformin including benefits and risks. She was informed that eating too many simple carbohydrates or too many calories at one sitting increases the likelihood of GI side effects. Caroline Gonzales will start metformin 500 MG every morning for now and a prescription was written today #30 no refills. Caroline Gonzales agreed to follow up with [name] as directed to monitor her progress.  Diabetes risk counselling Caroline Gonzales was given extended (at least 15 minutes) diabetes prevention counseling today. She is 41 y.o. female and has risk factors for diabetes including obesity. We discussed intensive lifestyle modifications today with an emphasis on weight loss as well as increasing exercise and decreasing simple carbohydrates in her diet.  Hyperlipidemia Caroline Gonzales was informed of the American Heart Association Guidelines emphasize intensive lifestyle modifications as the first line treatment for hyperlipidemia. We discussed many lifestyle modifications today in depth, and Caroline Gonzales will continue to work on decreasing saturated fats such as fatty red meat, butter and many fried foods. She will also increase vegetables and lean protein in her diet and continue to work on exercise and her weight loss efforts.  Obesity Caroline Gonzales is currently in the action stage of change. As such, her goal is to continue with weight loss efforts She has agreed to follow the Category 3 plan + 100 calories. Caroline Gonzales has been instructed to work up to a goal of 150 minutes of combined cardio and strengthening exercise per week for weight loss and overall health benefits. We discussed the following Behavioral Modification Stratagies today: increasing lean protein intake, decreasing simple carbohydrates , increasing vegetables, increasing lower sugar fruits and dealing with family or  coworker sabotage  Caroline Gonzales has agreed to follow up with our clinic in 2 weeks. She was informed of the importance of frequent follow up visits to maximize her success with intensive lifestyle modifications for her multiple health conditions.  I, Doreene Nest, am acting as scribe for Dennard Nip, MD  I have reviewed the above documentation for accuracy and completeness, and I agree with the above. -Dennard Nip, MD

## 2016-09-17 ENCOUNTER — Telehealth: Payer: Self-pay | Admitting: *Deleted

## 2016-09-17 ENCOUNTER — Encounter: Payer: Self-pay | Admitting: Obstetrics and Gynecology

## 2016-09-17 MED ORDER — METFORMIN HCL 500 MG PO TABS: 500.0000 mg | ORAL_TABLET | ORAL | 0 refills | Status: DC

## 2016-09-17 MED FILL — metFORMIN HCL 500 MG TABS: 500 | 30 days supply | Qty: 30 | Fill #0

## 2016-09-17 NOTE — Telephone Encounter (Signed)
The following message was sent to the patient: Hi Caroline Gonzales, Your Mirena IUD is all you need for contraception. The metformin is not a "fertility drug", it just can help women who aren't ovulating ovulate. Even with a "fertility Drug" the IUD would prevent pregnancy. I reviewed your old records, your IUD was placed in 7/14, so you should be covered until 7/19. Please call the office with any questions. Happy New Year! Sumner Boast  Will close the encounter

## 2016-09-17 NOTE — Telephone Encounter (Signed)
Dr. Talbert Nan, see MyChart message below and advise?  From [place] G Glander To Salvadore Dom, MD Sent 09/17/2016 11:15 AM  Good morning! I saw Dr Leafy Ro (the [facility]) yesterday, and she started me on metformin. She told me that it can increase fertility, and I just want to be sure that my IUD will be enough to combat it. If it's not, do I need to layer on oral contraceptives? I don't want any nasty surprises.Marland KitchenMarland Kitchen

## 2016-09-26 DIAGNOSIS — F3489 Other specified persistent mood disorders: Secondary | ICD-10-CM | POA: Diagnosis not present

## 2016-09-29 ENCOUNTER — Ambulatory Visit (INDEPENDENT_AMBULATORY_CARE_PROVIDER_SITE_OTHER): Payer: 59 | Admitting: Family Medicine

## 2016-09-29 ENCOUNTER — Encounter (INDEPENDENT_AMBULATORY_CARE_PROVIDER_SITE_OTHER): Payer: Self-pay | Admitting: Family Medicine

## 2016-09-29 VITALS — BP 119/76 | HR 95 | Temp 98.4°F | Ht 65.0 in | Wt 268.0 lb

## 2016-09-29 DIAGNOSIS — R7303 Prediabetes: Secondary | ICD-10-CM | POA: Diagnosis not present

## 2016-09-29 DIAGNOSIS — F3181 Bipolar II disorder: Secondary | ICD-10-CM | POA: Diagnosis not present

## 2016-09-29 NOTE — Progress Notes (Signed)
Office: [phone]  /  Fax: [phone]   HPI:   Chief Complaint: OBESITY Caroline Gonzales is here to discuss her progress with her obesity treatment plan. She is following her eating plan approximately 80 % of the time and states she is exercising by taking stairs, 1 up and 6 down 4 times per week. Caroline Gonzales has done well with weight loss, she did have some celebration eating, is working on increasing exercise. Her weight is 268 lb (121.6 kg) today and has had a weight loss of 3 pounds over a period of 2 weeks since her last visit. She has lost 10 lbs since starting treatment with [name].  Pre-Diabetes Caroline Gonzales has a diagnosis of prediabetes based on her elevated HgA1c and was informed this puts her at greater risk of developing diabetes. She is taking metformin currently and continues to work on diet and exercise to decrease risk of diabetes. She denies nausea or hypoglycemia, she does report some GI upset with metformin however this has resolved, polyphagia has improved as well.   Wt Readings from Last 500 Encounters:  09/29/16 268 lb (121.6 kg)  09/16/16 271 lb (122.9 kg)  08/31/16 278 lb (126.1 kg)  06/27/16 278 lb (126.1 kg)  06/19/16 282 lb (127.9 kg)  05/13/16 275 lb 6.4 oz (124.9 kg)  04/30/16 279 lb (126.6 kg)  04/29/16 278 lb (126.1 kg)  12/12/15 269 lb (122 kg)  12/01/15 270 lb 6.4 oz (122.7 kg)     ALLERGIES: Allergies  Allergen Reactions  . Tamiflu [Oseltamivir Phosphate] Anaphylaxis  . Cephalosporins Rash  . Latex Rash  . Penicillins Rash    MEDICATIONS: Current Outpatient Prescriptions on File Prior to Visit  Medication Sig Dispense Refill  . Azelastine-Fluticasone 137-50 MCG/ACT SUSP Place 2 sprays into the nose 2 (two) times daily.    . cetirizine (ZYRTEC) 10 MG tablet Take 10 mg by mouth at bedtime.    . cholecalciferol (VITAMIN D) 1000 units tablet Take 5,000 Units by mouth daily.    . clonazePAM (KLONOPIN) 1 MG tablet Take 1 mg by mouth 2 (two) times daily as  needed for anxiety.    . L-methylfolate Calcium 15 MG TABS Take 15 m by mouth daily.    [name] lamoTRIgine (LAMICTAL) 200 MG tablet Take 200 mg by mouth at bedtime.    [name] levonorgestrel (MIRENA) 20 MCG/24HR IUD 1 each by Intrauterine route once.    . loratadine (CLARITIN) 10 MG tablet Take 10 mg by mouth daily.    . Multiple Vitamin (MULTIVITAMIN) tablet Take 1 tablet by mouth Nightly.    . propranolol (INDERAL) 40 MG tablet Take 40 mg by mouth 2 (two) times daily.    . QUEtiapine (SEROQUEL) 400 MG tablet Take 400 mg by mouth at bedtime. Takes 1.5 tablets PO QD- total 600mg     . ranitidine (ZANTAC) 150 MG tablet Take 150 mg by mouth at bedtime.    . metFORMIN (GLUCOPHAGE) 500 MG tablet Take 1 tablet (500 mg total) by mouth 1 day or 1 dose. 30 tablet 0   No current facility-administered medications on file prior to visit.     PAST MEDICAL HISTORY: Past Medical History:  Diagnosis Date  . Allergy    Zyrtec, Allegra.  . Anxiety    followed by Dr. Toy Cookey  . Back pain   . Bipolar 1 disorder ([place])   . Depression   . Fibroid   . Gallbladder problem   . Gastroparesis 09/14/2012   gastric emptying study in 2014  .  GERD (gastroesophageal reflux disease)   . Vitamin D deficiency     PAST SURGICAL HISTORY: Past Surgical History:  Procedure Laterality Date  . CESAREAN SECTION    . CHOLECYSTECTOMY    . DILATION AND CURETTAGE OF UTERUS    . TENDON REPAIR Left    Left Ankle    SOCIAL HISTORY: Social History  Substance Use Topics  . Smoking status: Former Smoker    Types: Cigarettes    Quit date: 2003  . Smokeless tobacco: Never Used  . Alcohol use 0.0 oz/week     Comment: rarely - maybe 1 drink per month     FAMILY HISTORY: Family History  Problem Relation Age of Onset  . Cancer Mother     squamous cell carcinoma  . Hyperlipidemia Mother   . Depression Mother   . Anxiety disorder Mother   . Obesity Mother   . Heart disease Father     cardiomegaly, CHF  . Hyperlipidemia Father    . Hypertension Father   . Mental retardation Father   . High blood pressure Father   . Depression Father   . Anxiety disorder Father   . Obesity Father   . Cancer Maternal Grandmother   . Diabetes Maternal Grandmother   . Heart disease Maternal Grandmother   . Hyperlipidemia Maternal Grandmother   . Hypertension Maternal Grandmother   . Stroke Maternal Grandmother   . Heart disease Paternal Grandmother   . Hypertension Paternal Grandmother   . Heart disease Paternal Grandfather   . Hyperlipidemia Paternal Grandfather   . Mental illness Paternal Grandfather   . Breast cancer Maternal Aunt   . Thyroid cancer Paternal Aunt     ROS: Review of Systems  Constitutional: Positive for weight loss.  Gastrointestinal:       Gastrointestinal Upset    PHYSICAL EXAM: Blood pressure 119/76, pulse 95, temperature 98.4 F (36.9 C), temperature source Oral, height 5\' 5"  (1.651 m), weight 268 lb (121.6 kg), SpO2 97 %. Body mass index is 44.6 kg/m. Physical Exam  Constitutional: She is oriented to person, place, and time. She appears well-developed and well-nourished.  Cardiovascular: Normal rate.   Pulmonary/Chest: Effort normal.  Musculoskeletal: Normal range of motion. She exhibits edema (Trace edema bilateral lower extremities).  Neurological: She is oriented to person, place, and time.  Skin: Skin is warm and dry.  Psychiatric: She has a normal mood and affect. Her behavior is normal.  Vitals reviewed.   RECENT LABS AND TESTS: BMET    Component Value Date/Time   NA 138 08/31/2016 1644   K 4.6 08/31/2016 1644   CL 98 08/31/2016 1644   CO2 23 08/31/2016 1644   GLUCOSE 80 08/31/2016 1644   GLUCOSE 87 06/27/2016 0933   BUN 9 08/31/2016 1644   CREATININE 1.00 08/31/2016 1644   CREATININE 0.95 06/27/2016 0933   CALCIUM 9.3 08/31/2016 1644   GFRNONAA 71 08/31/2016 1644   GFRAA 81 08/31/2016 1644   Lab Results  Component Value Date   HGBA1C 5.7 (H) 08/31/2016   HGBA1C 5.6  12/01/2015   Lab Results  Component Value Date   INSULIN 17.4 08/31/2016   CBC    Component Value Date/Time   WBC 7.9 08/31/2016 1644   WBC 8.1 06/27/2016 0941   WBC 9.9 04/30/2016 0947   RBC 4.31 08/31/2016 1644   RBC 3.96 (A) 06/27/2016 0941   RBC 4.15 04/30/2016 0947   HGB 11.5 (A) 06/27/2016 0941   HGB 11.7 04/30/2016 0947   HCT  35.3 08/31/2016 1644   PLT 300 04/30/2016 0947   MCV 82 08/31/2016 1644   MCH 26.5 (L) 08/31/2016 1644   MCH 29.0 06/27/2016 0941   MCH 28.2 04/30/2016 0947   MCHC 32.3 08/31/2016 1644   MCHC 34.5 06/27/2016 0941   MCHC 33.2 04/30/2016 0947   RDW 15.5 (H) 08/31/2016 1644   LYMPHSABS 2.5 08/31/2016 1644   MONOABS 891 04/30/2016 0947   EOSABS 0.1 08/31/2016 1644   BASOSABS 0.0 08/31/2016 1644   Iron/TIBC/Ferritin/ %Sat    Component Value Date/Time   IRON 53 08/31/2016 1644   TIBC 280 08/31/2016 1644   FERRITIN 36 08/31/2016 1644   IRONPCTSAT 19 08/31/2016 1644   Lipid Panel     Component Value Date/Time   CHOL 148 08/31/2016 1644   TRIG 161 (H) 08/31/2016 1644   HDL 44 08/31/2016 1644   CHOLHDL 4.1 12/01/2015 0914   VLDL 37 (H) 12/01/2015 0914   LDLCALC 72 08/31/2016 1644   Hepatic Function Panel     Component Value Date/Time   PROT 7.4 08/31/2016 1644   ALBUMIN 4.0 08/31/2016 1644   AST 24 08/31/2016 1644   ALT 18 08/31/2016 1644   ALKPHOS 90 08/31/2016 1644   BILITOT 0.2 08/31/2016 1644      Component Value Date/Time   TSH 1.620 08/31/2016 1644   TSH 1.83 12/01/2015 0914    ASSESSMENT AND PLAN: Prediabetes  Morbid obesity ([place])  PLAN:  Pre-Diabetes Catherin will continue to work on weight loss, exercise, and decreasing simple carbohydrates in her diet to help decrease the risk of diabetes. We dicussed metformin including benefits and risks. She was informed that eating too many simple carbohydrates or too many calories at one sitting increases the likelihood of GI side effects. She will work to decrease simple  carbohydrates. Dinasia will continue to take metformin and agreed to follow up with [name] as directed to monitor her progress.  We spent > than 50% of the 15 minute visit on the counseling and coordination of care documented above.  Obesity Tyniah is currently in the action stage of change. As such, her goal is to continue with weight loss efforts She has agreed to follow the Category 3 plan Nami has been instructed to work up to a goal of 150 minutes of combined cardio and strengthening exercise per week for weight loss and overall health benefits. We discussed the following Behavioral Modification Stratagies today: increasing lean protein intake, increasing vegetables, work on meal planning and easy cooking plans and emotional eating strategies  Laurenda has agreed to follow up with our clinic in 2 weeks. She was informed of the importance of frequent follow up visits to maximize her success with intensive lifestyle modifications for her multiple health conditions.  I, Doreene Nest, am acting as scribe for Dennard Nip, MD  I have reviewed the above documentation for accuracy and completeness, and I agree with the above. -Dennard Nip, MD

## 2016-10-01 ENCOUNTER — Encounter (INDEPENDENT_AMBULATORY_CARE_PROVIDER_SITE_OTHER): Payer: Self-pay | Admitting: Family Medicine

## 2016-10-05 MED ORDER — METFORMIN HCL 500 MG PO TABS: 500.0000 mg | ORAL_TABLET | Freq: Two times a day (BID) | ORAL | 0 refills | Status: DC

## 2016-10-05 NOTE — Addendum Note (Signed)
Addended by: Dennard Nip D on: 10/05/2016 02:48 PM   Modules accepted: Orders

## 2016-10-06 ENCOUNTER — Encounter (INDEPENDENT_AMBULATORY_CARE_PROVIDER_SITE_OTHER): Payer: Self-pay | Admitting: Family Medicine

## 2016-10-06 ENCOUNTER — Other Ambulatory Visit (INDEPENDENT_AMBULATORY_CARE_PROVIDER_SITE_OTHER): Payer: Self-pay | Admitting: Family Medicine

## 2016-10-06 DIAGNOSIS — Z9189 Other specified personal risk factors, not elsewhere classified: Secondary | ICD-10-CM

## 2016-10-06 DIAGNOSIS — R7303 Prediabetes: Secondary | ICD-10-CM

## 2016-10-06 MED FILL — PROPRANOLOL 40 MG TABLET: 40 | 30 days supply | Qty: 60 | Fill #2

## 2016-10-06 MED FILL — DYMISTA NASAL SPRAY: 137-50 | 30 days supply | Qty: 23 | Fill #1

## 2016-10-06 MED FILL — L-METHYLFOLATE CALCIUM 15 M: 15 | 30 days supply | Qty: 30 | Fill #11

## 2016-10-06 MED FILL — lamoTRIgine 200 MG TABS: 200 | 30 days supply | Qty: 30 | Fill #9

## 2016-10-06 MED FILL — clonazePAM 1 MG TABS: 1 | 30 days supply | Qty: 60 | Fill #5

## 2016-10-06 MED FILL — QUETIAPINE FUMARATE 400 MG: 400 | 30 days supply | Qty: 45 | Fill #8

## 2016-10-07 MED FILL — metFORMIN HCL 500 MG TABS: 500 | 30 days supply | Qty: 60 | Fill #0

## 2016-10-10 DIAGNOSIS — F3489 Other specified persistent mood disorders: Secondary | ICD-10-CM | POA: Diagnosis not present

## 2016-10-15 ENCOUNTER — Ambulatory Visit (INDEPENDENT_AMBULATORY_CARE_PROVIDER_SITE_OTHER): Payer: 59 | Admitting: Family Medicine

## 2016-10-15 VITALS — BP 122/76 | HR 96 | Temp 98.3°F | Ht 65.0 in | Wt 263.0 lb

## 2016-10-15 DIAGNOSIS — R7303 Prediabetes: Secondary | ICD-10-CM

## 2016-10-15 DIAGNOSIS — F319 Bipolar disorder, unspecified: Secondary | ICD-10-CM | POA: Diagnosis not present

## 2016-10-15 NOTE — Progress Notes (Signed)
Office: [phone]  /  Fax: [phone]   HPI:   Chief Complaint: OBESITY Caroline Gonzales is here to discuss her progress with her obesity treatment plan. She has done well with weight loss. She is following her eating plan approximately 75 % of the time and states she is exercising 0 minutes 0 times per week. Tahjai is currently struggling with emotional eating and feelings of guilt.  Her weight is 263 lb (119.3 kg) today and has had a weight loss of 5 pounds over a period of 2 weeks since her last visit. She has lost 13 lbs since starting treatment with [name].  Pre-Diabetes Caroline Gonzales has a diagnosis of prediabetes based on her elevated HgA1c and was informed this puts her at greater risk of developing diabetes. She is taking metformin twice a day currently and  Notes decreased polyphagia. She continues to work on diet and exercise to decrease risk of diabetes. She denies nausea or hypoglycemia.  Bipolar Depression Caroline Gonzales notes feeling more depressed and increased emotional eating in the past 2 weeks, she states she feels guilty and defeated. Denies suicidal or homicidal ideas.   Wt Readings from Last 500 Encounters:  10/15/16 263 lb (119.3 kg)  09/29/16 268 lb (121.6 kg)  09/16/16 271 lb (122.9 kg)  08/31/16 278 lb (126.1 kg)  06/27/16 278 lb (126.1 kg)  06/19/16 282 lb (127.9 kg)  05/13/16 275 lb 6.4 oz (124.9 kg)  04/30/16 279 lb (126.6 kg)  04/29/16 278 lb (126.1 kg)  12/12/15 269 lb (122 kg)  12/01/15 270 lb 6.4 oz (122.7 kg)     ALLERGIES: Allergies  Allergen Reactions  . Tamiflu [Oseltamivir Phosphate] Anaphylaxis  . Cephalosporins Rash  . Latex Rash  . Penicillins Rash    MEDICATIONS: Current Outpatient Prescriptions on File Prior to Visit  Medication Sig Dispense Refill  . Azelastine-Fluticasone 137-50 MCG/ACT SUSP Place 2 sprays into the nose 2 (two) times daily.    . cetirizine (ZYRTEC) 10 MG tablet Take 10 mg by mouth at bedtime.    . cholecalciferol (VITAMIN  D) 1000 units tablet Take 5,000 Units by mouth daily.    . clonazePAM (KLONOPIN) 1 MG tablet Take 1 mg by mouth 2 (two) times daily as needed for anxiety.    . L-methylfolate Calcium 15 MG TABS Take 15 m by mouth daily.    [name] lamoTRIgine (LAMICTAL) 200 MG tablet Take 200 mg by mouth at bedtime.    [name] levonorgestrel (MIRENA) 20 MCG/24HR IUD 1 each by Intrauterine route once.    . loratadine (CLARITIN) 10 MG tablet Take 10 mg by mouth daily.    . metFORMIN (GLUCOPHAGE) 500 MG tablet Take 1 tablet (500 mg total) by mouth 2 (two) times daily with a meal. 60 tablet 0  . Multiple Vitamin (MULTIVITAMIN) tablet Take 1 tablet by mouth Nightly.    . propranolol (INDERAL) 40 MG tablet Take 40 mg by mouth 2 (two) times daily.    . QUEtiapine (SEROQUEL) 400 MG tablet Take 400 mg by mouth at bedtime. Takes 1.5 tablets PO QD- total 600mg     . ranitidine (ZANTAC) 150 MG tablet Take 150 mg by mouth at bedtime.     No current facility-administered medications on file prior to visit.     PAST MEDICAL HISTORY: Past Medical History:  Diagnosis Date  . Allergy    Zyrtec, Allegra.  . Anxiety    followed by Dr. Toy Cookey  . Back pain   . Bipolar 1 disorder ([place])   .  Depression   . Fibroid   . Gallbladder problem   . Gastroparesis 09/14/2012   gastric emptying study in 2014  . GERD (gastroesophageal reflux disease)   . Vitamin D deficiency     PAST SURGICAL HISTORY: Past Surgical History:  Procedure Laterality Date  . CESAREAN SECTION    . CHOLECYSTECTOMY    . DILATION AND CURETTAGE OF UTERUS    . TENDON REPAIR Left    Left Ankle    SOCIAL HISTORY: Social History  Substance Use Topics  . Smoking status: Former Smoker    Types: Cigarettes    Quit date: 2003  . Smokeless tobacco: Never Used  . Alcohol use 0.0 oz/week     Comment: rarely - maybe 1 drink per month     FAMILY HISTORY: Family History  Problem Relation Age of Onset  . Cancer Mother     squamous cell carcinoma  . Hyperlipidemia  Mother   . Depression Mother   . Anxiety disorder Mother   . Obesity Mother   . Heart disease Father     cardiomegaly, CHF  . Hyperlipidemia Father   . Hypertension Father   . Mental retardation Father   . High blood pressure Father   . Depression Father   . Anxiety disorder Father   . Obesity Father   . Cancer Maternal Grandmother   . Diabetes Maternal Grandmother   . Heart disease Maternal Grandmother   . Hyperlipidemia Maternal Grandmother   . Hypertension Maternal Grandmother   . Stroke Maternal Grandmother   . Heart disease Paternal Grandmother   . Hypertension Paternal Grandmother   . Heart disease Paternal Grandfather   . Hyperlipidemia Paternal Grandfather   . Mental illness Paternal Grandfather   . Breast cancer Maternal Aunt   . Thyroid cancer Paternal Aunt     ROS: Review of Systems  Gastrointestinal: Negative for nausea.  Endo/Heme/Allergies:       Negative hypoglycemia Positive polyphagia  Psychiatric/Behavioral: Positive for depression.    PHYSICAL EXAM: Blood pressure 122/76, pulse 96, temperature 98.3 F (36.8 C), temperature source Oral, height 5\' 5"  (1.651 m), weight 263 lb (119.3 kg), SpO2 97 %. Body mass index is 43.77 kg/m. Physical Exam  Constitutional: She is oriented to person, place, and time. She appears well-developed and well-nourished.  Cardiovascular: Normal rate.   Pulmonary/Chest: Effort normal.  Musculoskeletal: Normal range of motion. She exhibits edema (trace edema bilateral lower extremities).  Neurological: She is oriented to person, place, and time.  Skin: Skin is warm and dry.  Psychiatric:  Negative Suicidal ideas Negative homicidal ideas   Vitals reviewed.   RECENT LABS AND TESTS: BMET    Component Value Date/Time   NA 138 08/31/2016 1644   K 4.6 08/31/2016 1644   CL 98 08/31/2016 1644   CO2 23 08/31/2016 1644   GLUCOSE 80 08/31/2016 1644   GLUCOSE 87 06/27/2016 0933   BUN 9 08/31/2016 1644   CREATININE 1.00  08/31/2016 1644   CREATININE 0.95 06/27/2016 0933   CALCIUM 9.3 08/31/2016 1644   GFRNONAA 71 08/31/2016 1644   GFRAA 81 08/31/2016 1644   Lab Results  Component Value Date   HGBA1C 5.7 (H) 08/31/2016   HGBA1C 5.6 12/01/2015   Lab Results  Component Value Date   INSULIN 17.4 08/31/2016   CBC    Component Value Date/Time   WBC 7.9 08/31/2016 1644   WBC 8.1 06/27/2016 0941   WBC 9.9 04/30/2016 0947   RBC 4.31 08/31/2016 1644  RBC 3.96 (A) 06/27/2016 0941   RBC 4.15 04/30/2016 0947   HGB 11.5 (A) 06/27/2016 0941   HGB 11.7 04/30/2016 0947   HCT 35.3 08/31/2016 1644   PLT 300 04/30/2016 0947   MCV 82 08/31/2016 1644   MCH 26.5 (L) 08/31/2016 1644   MCH 29.0 06/27/2016 0941   MCH 28.2 04/30/2016 0947   MCHC 32.3 08/31/2016 1644   MCHC 34.5 06/27/2016 0941   MCHC 33.2 04/30/2016 0947   RDW 15.5 (H) 08/31/2016 1644   LYMPHSABS 2.5 08/31/2016 1644   MONOABS 891 04/30/2016 0947   EOSABS 0.1 08/31/2016 1644   BASOSABS 0.0 08/31/2016 1644   Iron/TIBC/Ferritin/ %Sat    Component Value Date/Time   IRON 53 08/31/2016 1644   TIBC 280 08/31/2016 1644   FERRITIN 36 08/31/2016 1644   IRONPCTSAT 19 08/31/2016 1644   Lipid Panel     Component Value Date/Time   CHOL 148 08/31/2016 1644   TRIG 161 (H) 08/31/2016 1644   HDL 44 08/31/2016 1644   CHOLHDL 4.1 12/01/2015 0914   VLDL 37 (H) 12/01/2015 0914   LDLCALC 72 08/31/2016 1644   Hepatic Function Panel     Component Value Date/Time   PROT 7.4 08/31/2016 1644   ALBUMIN 4.0 08/31/2016 1644   AST 24 08/31/2016 1644   ALT 18 08/31/2016 1644   ALKPHOS 90 08/31/2016 1644   BILITOT 0.2 08/31/2016 1644      Component Value Date/Time   TSH 1.620 08/31/2016 1644   TSH 1.83 12/01/2015 0914    ASSESSMENT AND PLAN: Prediabetes  Bipolar 1 disorder ([place])  Morbid obesity ([place])  PLAN:  Pre-Diabetes Caroline Gonzales will continue to work on weight loss, exercise, and decreasing simple carbohydrates in her diet to help decrease  the risk of diabetes. We dicussed metformin including benefits and risks. She was informed that eating too many simple carbohydrates or too many calories at one sitting increases the likelihood of GI side effects. She agrees to continue to take metformin 2 times a day and agreed to follow up with [name] as directed to monitor her progress.  Bipolar Depression Caroline Caroline Gonzales agrees to continue with Cognitive Behavioral Therapy to help decrease emotional eating. She agrees to continue with her therapist weekly.  We spent > than 50% of the 30 minute visit on the counseling as documented in the note.  Obesity Caroline Gonzales is currently in the action stage of change. As such, her goal is to continue with weight loss efforts She has agreed to follow the Category 3 plan Caroline Gonzales has been instructed to work up to a goal of 150 minutes of combined cardio and strengthening exercise per week for weight loss and overall health benefits. We discussed the following Behavioral Modification Stratagies today: increasing lean protein intake, decreasing simple carbohydrates , increasing vegetables, increasing lower sugar fruits, increasing fiber rich foods, decreasing sodium intake and emotional eating strategies  Caroline Gonzales has agreed to follow up with our clinic in 2 weeks. She was informed of the importance of frequent follow up visits to maximize her success with intensive lifestyle modifications for her multiple health conditions.  Caroline, Doreene Nest, am acting as scribe for Dennard Nip, MD  Caroline have reviewed the above documentation for accuracy and completeness, and Caroline agree with the above. -Dennard Nip, MD

## 2016-10-24 DIAGNOSIS — F3489 Other specified persistent mood disorders: Secondary | ICD-10-CM | POA: Diagnosis not present

## 2016-10-28 ENCOUNTER — Ambulatory Visit: Payer: 59 | Admitting: Family Medicine

## 2016-11-02 ENCOUNTER — Ambulatory Visit (INDEPENDENT_AMBULATORY_CARE_PROVIDER_SITE_OTHER): Payer: 59 | Admitting: Family Medicine

## 2016-11-02 VITALS — BP 117/77 | HR 88 | Temp 98.1°F | Ht 65.0 in | Wt 261.0 lb

## 2016-11-02 DIAGNOSIS — Z9189 Other specified personal risk factors, not elsewhere classified: Secondary | ICD-10-CM

## 2016-11-02 DIAGNOSIS — F5081 Binge eating disorder: Secondary | ICD-10-CM

## 2016-11-02 DIAGNOSIS — R7303 Prediabetes: Secondary | ICD-10-CM | POA: Diagnosis not present

## 2016-11-02 MED ORDER — TOPIRAMATE 50 MG PO TABS: 50.0000 mg | ORAL_TABLET | Freq: Every day | ORAL | 0 refills | Status: DC

## 2016-11-02 MED ORDER — METFORMIN HCL 500 MG PO TABS: 500.0000 mg | ORAL_TABLET | Freq: Two times a day (BID) | ORAL | 0 refills | Status: DC

## 2016-11-02 MED FILL — metFORMIN HCL 500 MG TABS: 500 | 30 days supply | Qty: 60 | Fill #0

## 2016-11-02 MED FILL — TOPIRAMATE 50 MG TABLET: 50 | 30 days supply | Qty: 30 | Fill #0

## 2016-11-02 NOTE — Progress Notes (Signed)
Office: [phone]  /  Fax: [phone]   HPI:   Chief Complaint: OBESITY Caroline Gonzales is here to discuss her progress with her obesity treatment plan. She is following her eating plan approximately 50 % of the time and states she is exercising 0 minutes 0 times per week. Caroline Gonzales continues to do well with weight loss but notes increased binge eating behaviors 1 to 2 times per week. She is doing well with liquid calories but is still struggling with planning meals ahead of time.  Her weight is 261 lb (118.4 kg) today and has had a weight loss of 2 pounds over a period of 2 to 3  weeks since her last visit. She has lost 17 lbs since starting treatment with [name].  Binge Eating Episodes Caroline Gonzales notes binge eating approximately 2 times per week over the past 2 weeks with increased stress at home and work. She is on IUD for birth control and is on multiple psychiatric medications with increased binge eating behaviors. She used to binge more frequently but she had been doing better before the increased home stress.  Pre-Diabetes Caroline Gonzales has a diagnosis of prediabetes based on her elevated HgA1c at 5.7 and was informed this puts her at greater risk of developing diabetes. She is doing well with weight loss and improving her diet.  She is not taking metformin currently but continues to work on diet and exercise to decrease risk of diabetes. She denies nausea or hypoglycemia.   Wt Readings from Last 500 Encounters:  11/02/16 261 lb (118.4 kg)  10/15/16 263 lb (119.3 kg)  09/29/16 268 lb (121.6 kg)  09/16/16 271 lb (122.9 kg)  08/31/16 278 lb (126.1 kg)  06/27/16 278 lb (126.1 kg)  06/19/16 282 lb (127.9 kg)  05/13/16 275 lb 6.4 oz (124.9 kg)  04/30/16 279 lb (126.6 kg)  04/29/16 278 lb (126.1 kg)  12/12/15 269 lb (122 kg)  12/01/15 270 lb 6.4 oz (122.7 kg)     ALLERGIES: Allergies  Allergen Reactions  . Tamiflu [Oseltamivir Phosphate] Anaphylaxis  . Cephalosporins Rash  . Latex Rash  .  Penicillins Rash    MEDICATIONS: Current Outpatient Prescriptions on File Prior to Visit  Medication Sig Dispense Refill  . Azelastine-Fluticasone 137-50 MCG/ACT SUSP Place 2 sprays into the nose 2 (two) times daily.    . cetirizine (ZYRTEC) 10 MG tablet Take 10 mg by mouth at bedtime.    . cholecalciferol (VITAMIN D) 1000 units tablet Take 5,000 Units by mouth daily.    . clonazePAM (KLONOPIN) 1 MG tablet Take 1 mg by mouth 2 (two) times daily as needed for anxiety.    . L-methylfolate Calcium 15 MG TABS Take 15 m by mouth daily.    [name] lamoTRIgine (LAMICTAL) 200 MG tablet Take 200 mg by mouth at bedtime.    [name] levonorgestrel (MIRENA) 20 MCG/24HR IUD 1 each by Intrauterine route once.    . loratadine (CLARITIN) 10 MG tablet Take 10 mg by mouth daily.    . Multiple Vitamin (MULTIVITAMIN) tablet Take 1 tablet by mouth Nightly.    . propranolol (INDERAL) 40 MG tablet Take 40 mg by mouth 2 (two) times daily.    . QUEtiapine (SEROQUEL) 400 MG tablet Take 400 mg by mouth at bedtime. Takes 1.5 tablets PO QD- total 600mg     . ranitidine (ZANTAC) 150 MG tablet Take 150 mg by mouth at bedtime.     No current facility-administered medications on file prior to visit.  PAST MEDICAL HISTORY: Past Medical History:  Diagnosis Date  . Allergy    Zyrtec, Allegra.  . Anxiety    followed by Dr. Toy Cookey  . Back pain   . Bipolar 1 disorder ([place])   . Depression   . Fibroid   . Gallbladder problem   . Gastroparesis 09/14/2012   gastric emptying study in 2014  . GERD (gastroesophageal reflux disease)   . Vitamin D deficiency     PAST SURGICAL HISTORY: Past Surgical History:  Procedure Laterality Date  . CESAREAN SECTION    . CHOLECYSTECTOMY    . DILATION AND CURETTAGE OF UTERUS    . TENDON REPAIR Left    Left Ankle    SOCIAL HISTORY: Social History  Substance Use Topics  . Smoking status: Former Smoker    Types: Cigarettes    Quit date: 2003  . Smokeless tobacco: Never Used  . Alcohol  use 0.0 oz/week     Comment: rarely - maybe 1 drink per month     FAMILY HISTORY: Family History  Problem Relation Age of Onset  . Cancer Mother     squamous cell carcinoma  . Hyperlipidemia Mother   . Depression Mother   . Anxiety disorder Mother   . Obesity Mother   . Heart disease Father     cardiomegaly, CHF  . Hyperlipidemia Father   . Hypertension Father   . Mental retardation Father   . High blood pressure Father   . Depression Father   . Anxiety disorder Father   . Obesity Father   . Cancer Maternal Grandmother   . Diabetes Maternal Grandmother   . Heart disease Maternal Grandmother   . Hyperlipidemia Maternal Grandmother   . Hypertension Maternal Grandmother   . Stroke Maternal Grandmother   . Heart disease Paternal Grandmother   . Hypertension Paternal Grandmother   . Heart disease Paternal Grandfather   . Hyperlipidemia Paternal Grandfather   . Mental illness Paternal Grandfather   . Breast cancer Maternal Aunt   . Thyroid cancer Paternal Aunt     ROS: Review of Systems  Constitutional: Positive for weight loss.  Gastrointestinal: Negative for nausea.  Endo/Heme/Allergies:       Negative hypoglycemia  Psychiatric/Behavioral:       Stress    PHYSICAL EXAM: Blood pressure 117/77, pulse 88, temperature 98.1 F (36.7 C), temperature source Oral, height 5\' 5"  (1.651 m), weight 261 lb (118.4 kg), SpO2 100 %. Body mass index is 43.43 kg/m. Physical Exam  Constitutional: She is oriented to person, place, and time. She appears well-developed and well-nourished.  Cardiovascular: Normal rate.   Pulmonary/Chest: Effort normal.  Musculoskeletal: Normal range of motion.  Neurological: She is oriented to person, place, and time.  Skin: Skin is warm and dry.  Vitals reviewed.   RECENT LABS AND TESTS: BMET    Component Value Date/Time   NA 138 08/31/2016 1644   K 4.6 08/31/2016 1644   CL 98 08/31/2016 1644   CO2 23 08/31/2016 1644   GLUCOSE 80  08/31/2016 1644   GLUCOSE 87 06/27/2016 0933   BUN 9 08/31/2016 1644   CREATININE 1.00 08/31/2016 1644   CREATININE 0.95 06/27/2016 0933   CALCIUM 9.3 08/31/2016 1644   GFRNONAA 71 08/31/2016 1644   GFRAA 81 08/31/2016 1644   Lab Results  Component Value Date   HGBA1C 5.7 (H) 08/31/2016   HGBA1C 5.6 12/01/2015   Lab Results  Component Value Date   INSULIN 17.4 08/31/2016   CBC  Component Value Date/Time   WBC 7.9 08/31/2016 1644   WBC 8.1 06/27/2016 0941   WBC 9.9 04/30/2016 0947   RBC 4.31 08/31/2016 1644   RBC 3.96 (A) 06/27/2016 0941   RBC 4.15 04/30/2016 0947   HGB 11.5 (A) 06/27/2016 0941   HGB 11.7 04/30/2016 0947   HCT 35.3 08/31/2016 1644   PLT 300 04/30/2016 0947   MCV 82 08/31/2016 1644   MCH 26.5 (L) 08/31/2016 1644   MCH 29.0 06/27/2016 0941   MCH 28.2 04/30/2016 0947   MCHC 32.3 08/31/2016 1644   MCHC 34.5 06/27/2016 0941   MCHC 33.2 04/30/2016 0947   RDW 15.5 (H) 08/31/2016 1644   LYMPHSABS 2.5 08/31/2016 1644   MONOABS 891 04/30/2016 0947   EOSABS 0.1 08/31/2016 1644   BASOSABS 0.0 08/31/2016 1644   Iron/TIBC/Ferritin/ %Sat    Component Value Date/Time   IRON 53 08/31/2016 1644   TIBC 280 08/31/2016 1644   FERRITIN 36 08/31/2016 1644   IRONPCTSAT 19 08/31/2016 1644   Lipid Panel     Component Value Date/Time   CHOL 148 08/31/2016 1644   TRIG 161 (H) 08/31/2016 1644   HDL 44 08/31/2016 1644   CHOLHDL 4.1 12/01/2015 0914   VLDL 37 (H) 12/01/2015 0914   LDLCALC 72 08/31/2016 1644   Hepatic Function Panel     Component Value Date/Time   PROT 7.4 08/31/2016 1644   ALBUMIN 4.0 08/31/2016 1644   AST 24 08/31/2016 1644   ALT 18 08/31/2016 1644   ALKPHOS 90 08/31/2016 1644   BILITOT 0.2 08/31/2016 1644      Component Value Date/Time   TSH 1.620 08/31/2016 1644   TSH 1.83 12/01/2015 0914    ASSESSMENT AND PLAN: Prediabetes - Plan: metFORMIN (GLUCOPHAGE) 500 MG tablet  Recurrent binge eating - Plan: topiramate (TOPAMAX) 50 MG  tablet  Morbid obesity ([place])  PLAN:  Binge Eating Episodes Shalynn agrees to start to take Topamax 50 mg every night #30 with no refills and will follow up in our office in 2 weeks.  Pre-Diabetes Labrea will continue to work on weight loss, exercise, and decreasing simple carbohydrates in her diet to help decrease the risk of diabetes. We dicussed metformin including benefits and risks. She was informed that eating too many simple carbohydrates or too many calories at one sitting increases the likelihood of GI side effects. Milagro requested metformin for now and a prescription was written today for Metformin 500 mg 2 times a day #60 with no refills. Trudi agreed to follow up with [name] as directed to monitor her progress.  Diabetes risk counselling Candid was given extended (at least 15 minutes) diabetes prevention counseling today. She is 41 y.o. female and has risk factors for diabetes including obesity. We discussed intensive lifestyle modifications today with an emphasis on weight loss as well as increasing exercise and decreasing simple carbohydrates in her diet.  Obesity Ayniah states she is currently in the action stage of change. H goal is to continue with weight loss efforts and see if she can get back on track. She has agreed to follow the Category 3 plan Maeleigh has been instructed to work up to a goal of 150 minutes of combined cardio and strengthening exercise per week for weight loss and overall health benefits. We discussed the following Behavioral Modification Stratagies today: increasing lean protein intake, decrease eating out, dealing with family sabotage and holiday eating strategies   Jaquelinne has agreed to follow up with our clinic in 2 weeks.  She was informed of the importance of frequent follow up visits to maximize her success with intensive lifestyle modifications for her multiple health conditions.  I, Doreene Nest, am acting as scribe for Dennard Nip, MD  I  have reviewed the above documentation for accuracy and completeness, and I agree with the above. -Dennard Nip, MD

## 2016-11-04 MED FILL — QUETIAPINE FUMARATE 400 MG: 400 | 30 days supply | Qty: 45 | Fill #9

## 2016-11-04 MED FILL — lamoTRIgine 200 MG TABS: 200 | 30 days supply | Qty: 30 | Fill #10

## 2016-11-04 MED FILL — PROPRANOLOL 40 MG TABLET: 40 | 30 days supply | Qty: 60 | Fill #3

## 2016-11-05 MED FILL — clonazePAM 1 MG TABS: 1 | 30 days supply | Qty: 60 | Fill #0

## 2016-11-06 MED FILL — L-METHYLFOLATE CALCIUM 15 M: 15 | 30 days supply | Qty: 30 | Fill #0

## 2016-11-12 ENCOUNTER — Other Ambulatory Visit: Payer: Self-pay | Admitting: Podiatry

## 2016-11-12 ENCOUNTER — Encounter: Payer: Self-pay | Admitting: Physician Assistant

## 2016-11-12 ENCOUNTER — Ambulatory Visit (INDEPENDENT_AMBULATORY_CARE_PROVIDER_SITE_OTHER): Payer: 59 | Admitting: Podiatry

## 2016-11-12 ENCOUNTER — Encounter: Payer: Self-pay | Admitting: Podiatry

## 2016-11-12 ENCOUNTER — Ambulatory Visit (INDEPENDENT_AMBULATORY_CARE_PROVIDER_SITE_OTHER): Payer: 59 | Admitting: Physician Assistant

## 2016-11-12 ENCOUNTER — Ambulatory Visit (INDEPENDENT_AMBULATORY_CARE_PROVIDER_SITE_OTHER): Payer: 59

## 2016-11-12 VITALS — BP 110/80 | HR 91 | Temp 98.1°F | Resp 16 | Ht 64.0 in | Wt 264.4 lb

## 2016-11-12 DIAGNOSIS — Z01818 Encounter for other preprocedural examination: Secondary | ICD-10-CM | POA: Diagnosis not present

## 2016-11-12 DIAGNOSIS — M795 Residual foreign body in soft tissue: Secondary | ICD-10-CM

## 2016-11-12 HISTORY — DX: Residual foreign body in soft tissue: M79.5

## 2016-11-12 NOTE — Patient Instructions (Addendum)
  I will contact you with your lab results. Once they are completed, we will fax [facility].   Thank you for letting me participate in your health and well being.    IF you received an x-ray today, you will receive an invoice from [facility]. Please contact [facility] at [phone] with questions or concerns regarding your invoice.   IF you received labwork today, you will receive an invoice from [facility]. Please contact [facility] at [phone] with questions or concerns regarding your invoice.   Our billing staff will not be able to assist you with questions regarding bills from these companies.  You will be contacted with the lab results as soon as they are available. The fastest way to get your results is to activate your My Chart account. Instructions are located on the last page of this paperwork. If you have not heard from [name] regarding the results in 2 weeks, please contact this office.

## 2016-11-12 NOTE — Progress Notes (Signed)
   Subjective:    Patient ID: Caroline Gonzales, female    DOB: 13-Nov-1975, 41 y.o.   MRN: [id number]  HPI  41 year old female presents the office today for concerns of a foreign body in her left foot. She states that several years ago she stepped on a needle and did not have any issues at that time however over the last several weeks she has felt something move in having a sharp pain in the area. She states that she feels that there is a foreign object moving around in her foot and should have the foreign object removed. She denies any swelling or redness to her foot. She said no recent treatment for this.   Review of Systems  All other systems reviewed and are negative.      Objective:   Physical Exam General: AAO x3, NAD  Dermatological: Skin is warm, dry and supple bilateral. Nails x 10 are well manicured; remaining integument appears unremarkable at this time. There are no open sores, no preulcerative lesions, no rash or signs of infection present. No evidence of puncture wounds.   Vascular: Dorsalis Pedis artery and Posterior Tibial artery pedal pulses are 2/4 bilateral with immedate capillary fill time. Pedal hair growth present. No varicosities and no lower extremity edema present bilateral. There is no pain with calf compression, swelling, warmth, erythema.   Neruologic: Grossly intact via light touch bilateral. Vibratory intact via tuning fork bilateral. Protective threshold with Semmes Wienstein monofilament intact to all pedal sites bilateral.   Musculoskeletal: there is tenderness of the left foot on the first interspace which does correspond to the area of the soft tissue mass. The plantar aspect of the first interspace does appear to have a firm mass although small which is tender to palpate. There is no fluctuance or crepitus. There is no open sores identified. Muscular strength 5/5 in all groups tested bilateral.  Gait: Unassisted, Nonantalgic.      Assessment & Plan:    41 year old female left foot foreign body -Treatment options discussed including all alternatives, risks, and complications -Etiology of symptoms were discussed -X-rays were obtained and reviewed with the patient. Deformities present. No evidence of acute fracture. -Discussed both conservative and surgical treatment options. This time she wishes to proceed to have the foreign body removed. No planned in the next week at the hospital. She will need to history and physical done prior to this. -The incision placement as well as the postoperative course was discussed with the patient. I discussed risks of the surgery which include, but not limited to, infection, bleeding, pain, swelling, need for further surgery, delayed or nonhealing, painful or ugly scar, numbness or sensation changes, over/under correction, recurrence, transfer lesions, further deformity, hardware failure, DVT/PE, loss of toe/foot, unable to remove the foreign object. Patient understands these risks and wishes to proceed with surgery. The surgical consent was reviewed with the patient all 3 pages were signed. No promises or guarantees were given to the outcome of the procedure. All questions were answered to the best of my ability. Before the surgery the patient was encouraged to call the office if there is any further questions. The surgery will be performed at the [facility] on an outpatient basis.  Celesta Gentile, DPM

## 2016-11-12 NOTE — Patient Instructions (Signed)

## 2016-11-12 NOTE — Progress Notes (Signed)
Caroline Gonzales  MRN: [id number] DOB: May 23, 1976  Subjective:  Caroline Gonzales is a 41 y.o. female seen in office today for a chief complaint of pre op clearance for foreign body removal in left foot. [facility] They will be doing monitored anesthesia care, not general anesthesia.  Her last surgery was ankle repair in 2011, she had general anesthesia and did fine.   Pt has PMH of anxiety, depression, and prediabetes.   In terms of fitness, she just stared seeing a personal trainer 3 weeks ago for exercise. She goes to the gym once a week. She is doing cardio and weights. She denies any exertional chest pain, diaphoresis, SOB, nausea, and vomiting. She can walk up 4 flights of stairs before taking a break. She is a former smoker. Quit in 2002, smoked 2 ppd x 10 years. She has family history of heart disease in father (he has CHF, no hx of MI). Her last EKG was 08/31/2016 and showed NSR at 88 bpm. She had this for enrollment into a healthy weight management program.  She has no personal history of heart disease, asthma or COPD.   Review of Systems  Constitutional: Negative for chills, diaphoresis, fatigue and fever.  HENT: Negative for congestion.   Respiratory: Negative for cough and shortness of breath.   Cardiovascular: Negative for chest pain, palpitations and leg swelling.  Gastrointestinal: Negative for abdominal pain, constipation, nausea and vomiting.  Neurological: Negative for dizziness, weakness and light-headedness.    Patient Active Problem List   Diagnosis Date Noted  . Prediabetes 11/02/2016  . Morbid obesity ([place]) 10/15/2016  . Bipolar 1 disorder [facility])     Current Outpatient Prescriptions on File Prior to Visit  Medication Sig Dispense Refill  . Azelastine-Fluticasone 137-50 MCG/ACT SUSP Place 2 sprays into the nose 2 (two) times daily.    . cetirizine (ZYRTEC) 10 MG tablet Take 10 mg by mouth at bedtime.    .  cholecalciferol (VITAMIN D) 1000 units tablet Take 5,000 Units by mouth daily.    . clonazePAM (KLONOPIN) 1 MG tablet Take 1 mg by mouth 2 (two) times daily as needed for anxiety.    . L-methylfolate Calcium 15 MG TABS Take 15 m by mouth daily.    [name] lamoTRIgine (LAMICTAL) 200 MG tablet Take 200 mg by mouth at bedtime.    [name] levonorgestrel (MIRENA) 20 MCG/24HR IUD 1 each by Intrauterine route once.    . loratadine (CLARITIN) 10 MG tablet Take 10 mg by mouth daily.    . metFORMIN (GLUCOPHAGE) 500 MG tablet Take 1 tablet (500 mg total) by mouth 2 (two) times daily with a meal. 60 tablet 0  . Multiple Vitamin (MULTIVITAMIN) tablet Take 1 tablet by mouth Nightly.    . propranolol (INDERAL) 40 MG tablet Take 40 mg by mouth 2 (two) times daily.    . QUEtiapine (SEROQUEL) 400 MG tablet Take 400 mg by mouth at bedtime. Takes 1.5 tablets PO QD- total 600mg     . ranitidine (ZANTAC) 150 MG tablet Take 150 mg by mouth at bedtime.    . topiramate (TOPAMAX) 50 MG tablet Take 1 tablet (50 mg total) by mouth daily. 30 tablet 0   No current facility-administered medications on file prior to visit.     Allergies  Allergen Reactions  . Tamiflu [Oseltamivir Phosphate] Anaphylaxis  . Cephalosporins Rash  . Latex Rash  . Penicillins Rash   Social History   Social  History  . Marital status: Married    Spouse name: N/A  . Number of children: N/A  . Years of education: N/A   Occupational History  . RN [employer]   Social History Main Topics  . Smoking status: Former Smoker    Types: Cigarettes    Quit date: 2003  . Smokeless tobacco: Never Used  . Alcohol use 0.0 oz/week     Comment: rarely - maybe 1 drink per month   . Drug use: No  . Sexual activity: Yes    Partners: Male   Other Topics Concern  . 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      Seatbelt: 100%; no texting while driving often          Objective:  BP 110/80 (BP Location: Right Arm, Patient Position: Sitting, Cuff Size: Large)   Pulse 91   Temp 98.1 F (36.7 C) (Oral)   Resp 16   Ht 5\' 4"  (1.626 m)   Wt 264 lb 6.4 oz (119.9 kg)   SpO2 99%   BMI 45.38 kg/m   Physical Exam  Constitutional: She is oriented to person, place, and time and well-developed, well-nourished, and in no distress.  HENT:  Head: Normocephalic and atraumatic.  Right Ear: Tympanic membrane, external ear and ear canal normal.  Left Ear: Tympanic membrane, external ear and ear canal normal.  Nose: Nose normal.  Mouth/Throat: Uvula is midline, oropharynx is clear and moist and mucous membranes are normal.  Eyes: Conjunctivae are normal. Pupils are equal, round, and reactive to light.  Neck: Normal range of motion.  Cardiovascular: Normal rate, regular rhythm, normal heart sounds, intact distal pulses and normal pulses.   Pulmonary/Chest: Effort normal and breath sounds normal.  Abdominal: Soft. Normal appearance and bowel sounds are normal. There is no tenderness.  Musculoskeletal: Normal range of motion.  Neurological: She is alert and oriented to person, place, and time. Gait normal.  Skin: Skin is warm and dry.  Psychiatric: Affect normal.  Vitals reviewed.   Assessment and Plan :  1. Preoperative clearance Labs pending, most recent EKG from 08/31/2016 reviewed.  As long as labs are WNL, pt can be cleared for surgery. Will fax results and documents to [facility] with Differential/Platelet - Basic metabolic panel - PT AND PTT  Tenna Delaine PA-C  [facility] 11/12/2016 4:22 PM

## 2016-11-13 LAB — CBC WITH DIFFERENTIAL/PLATELET
Basophils Absolute: 0 10*3/uL (ref 0.0–0.2)
Basos: 0 %
EOS (ABSOLUTE): 0.1 10*3/uL (ref 0.0–0.4)
Eos: 2 %
Hematocrit: 36.5 % (ref 34.0–46.6)
Hemoglobin: 11.9 g/dL (ref 11.1–15.9)
Immature Grans (Abs): 0 10*3/uL (ref 0.0–0.1)
Immature Granulocytes: 0 %
Lymphocytes Absolute: 2.2 10*3/uL (ref 0.7–3.1)
Lymphs: 28 %
MCH: 27.3 pg (ref 26.6–33.0)
MCHC: 32.6 g/dL (ref 31.5–35.7)
MCV: 84 fL (ref 79–97)
Monocytes Absolute: 0.8 10*3/uL (ref 0.1–0.9)
Monocytes: 10 %
Neutrophils Absolute: 4.8 10*3/uL (ref 1.4–7.0)
Neutrophils: 60 %
Platelets: 332 10*3/uL (ref 150–379)
RBC: 4.36 x10E6/uL (ref 3.77–5.28)
RDW: 15.5 % — ABNORMAL HIGH (ref 12.3–15.4)
WBC: 7.9 10*3/uL (ref 3.4–10.8)

## 2016-11-13 LAB — BASIC METABOLIC PANEL
BUN/Creatinine Ratio: 14 (ref 9–23)
BUN: 13 mg/dL (ref 6–24)
CO2: 23 mmol/L (ref 18–29)
Calcium: 8.3 mg/dL — ABNORMAL LOW (ref 8.7–10.2)
Chloride: 105 mmol/L (ref 96–106)
Creatinine, Ser: 0.94 mg/dL (ref 0.57–1.00)
GFR calc Af Amer: 87 mL/min/{1.73_m2} (ref >59)
GFR calc non Af Amer: 76 mL/min/{1.73_m2} (ref >59)
Glucose: 85 mg/dL (ref 65–99)
Potassium: 3.9 mmol/L (ref 3.5–5.2)
Sodium: 139 mmol/L (ref 134–144)

## 2016-11-13 LAB — PT AND PTT
INR: 1 (ref 0.8–1.2)
Prothrombin Time: 10.2 s (ref 9.1–12.0)
aPTT: 29 s (ref 24–33)

## 2016-11-14 DIAGNOSIS — F3489 Other specified persistent mood disorders: Secondary | ICD-10-CM | POA: Diagnosis not present

## 2016-11-17 ENCOUNTER — Ambulatory Visit (INDEPENDENT_AMBULATORY_CARE_PROVIDER_SITE_OTHER): Payer: 59 | Admitting: Family Medicine

## 2016-11-17 ENCOUNTER — Encounter (HOSPITAL_COMMUNITY): Payer: Self-pay | Admitting: *Deleted

## 2016-11-17 VITALS — BP 138/80 | HR 86 | Temp 97.8°F | Ht 65.0 in | Wt 260.0 lb

## 2016-11-17 DIAGNOSIS — R632 Polyphagia: Secondary | ICD-10-CM | POA: Diagnosis not present

## 2016-11-17 DIAGNOSIS — R7303 Prediabetes: Secondary | ICD-10-CM | POA: Diagnosis not present

## 2016-11-17 MED ORDER — METFORMIN HCL 500 MG PO TABS: 500.0000 mg | ORAL_TABLET | Freq: Two times a day (BID) | ORAL | 0 refills | Status: DC

## 2016-11-17 NOTE — Progress Notes (Signed)
Office: [phone]  /  Fax: [phone]   HPI:   Chief Complaint: OBESITY Caroline Gonzales is here to discuss her progress with her obesity treatment plan. She is following her eating plan approximately 20 % of the time and states she is exercising 30 minutes 1 to 2 times per week. Caroline Gonzales has done well with weight loss but has had increased stress over the past 2 weeks at work, home and school. She has fallen off track and has been trying to portion control/smart choices.  Her weight is 260 lb (117.9 kg) today and has had a weight loss of 1 pound over a period of 2 weeks since her last visit. She has lost 18 lbs since starting treatment with [name].  Pre-Diabetes Caroline Gonzales has a diagnosis of prediabetes based on her elevated HgA1c and was informed this puts her at greater risk of developing diabetes. She is taking metformin currently and continues to work on diet and exercise to decrease risk of diabetes. She denies nausea, vomiting or hypoglycemia.  Binge Eating Orva started Topamax but isn't sure if it is helping yet with her significantly increased stress over the last 2 weeks. She is still currently binging 5 times per week. She denies purging.   Wt Readings from Last 500 Encounters:  11/17/16 260 lb (117.9 kg)  11/12/16 264 lb 6.4 oz (119.9 kg)  11/02/16 261 lb (118.4 kg)  10/15/16 263 lb (119.3 kg)  09/29/16 268 lb (121.6 kg)  09/16/16 271 lb (122.9 kg)  08/31/16 278 lb (126.1 kg)  06/27/16 278 lb (126.1 kg)  06/19/16 282 lb (127.9 kg)  05/13/16 275 lb 6.4 oz (124.9 kg)  04/30/16 279 lb (126.6 kg)  04/29/16 278 lb (126.1 kg)  12/12/15 269 lb (122 kg)  12/01/15 270 lb 6.4 oz (122.7 kg)     ALLERGIES: Allergies  Allergen Reactions  . Tamiflu [Oseltamivir Phosphate] Anaphylaxis  . Cephalosporins Rash  . Latex Rash  . Penicillins Rash    MEDICATIONS: Current Outpatient Prescriptions on File Prior to Visit  Medication Sig Dispense Refill  . cetirizine (ZYRTEC) 10 MG tablet  Take 10 mg by mouth at bedtime.    . Cholecalciferol (VITAMIN D3) 5000 units CAPS Take 5,000 Units by mouth at bedtime.    . clonazePAM (KLONOPIN) 1 MG tablet Take 1 mg by mouth 2 (two) times daily as needed for anxiety.    [name] ibuprofen (ADVIL,MOTRIN) 200 MG tablet Take 400-600 mg by mouth daily as needed for headache or moderate pain.    . L-methylfolate Calcium 15 MG TABS Take 15 m by mouth daily.    [name] lamoTRIgine (LAMICTAL) 200 MG tablet Take 200 mg by mouth at bedtime.    [name] levonorgestrel (MIRENA) 20 MCG/24HR IUD 1 each by Intrauterine route once.    . loratadine (CLARITIN) 10 MG tablet Take 10 mg by mouth at bedtime.     . Multiple Vitamin (MULTIVITAMIN) tablet Take 1 tablet by mouth Nightly.    . propranolol (INDERAL) 40 MG tablet Take 40 mg by mouth 2 (two) times daily as needed (anxiety).     . QUEtiapine (SEROQUEL) 400 MG tablet Take 600 mg by mouth at bedtime.     . ranitidine (ZANTAC) 150 MG tablet Take 150 mg by mouth at bedtime.    . topiramate (TOPAMAX) 50 MG tablet Take 1 tablet (50 mg total) by mouth daily. (Patient taking differently: Take 50 mg by mouth every evening. ) 30 tablet 0  . triamcinolone ointment (KENALOG) 0.1 %  Apply 1 application topically daily as needed (rash).   3   No current facility-administered medications on file prior to visit.     PAST MEDICAL HISTORY: Past Medical History:  Diagnosis Date  . Allergy    Zyrtec, Allegra.  . Anxiety    followed by Dr. Toy Cookey  . Back pain   . Bipolar 1 disorder ([place])   . Complication of anesthesia   . Depression   . Fibroid   . Gallbladder problem   . Gastroparesis 09/14/2012   gastric emptying study in 2014  . GERD (gastroesophageal reflux disease)   . Pneumonia    2013ish  . PONV (postoperative nausea and vomiting)   . Pre-diabetes   . PTSD (post-traumatic stress disorder)   . Vitamin D deficiency     PAST SURGICAL HISTORY: Past Surgical History:  Procedure Laterality Date  . CESAREAN SECTION    .  CHOLECYSTECTOMY    . DILATION AND CURETTAGE OF UTERUS    . PILONIDAL CYST EXCISION  1990's  . TENDON REPAIR Left    Left Ankle  . WISDOM TOOTH EXTRACTION  19090's    SOCIAL HISTORY: Social History  Substance Use Topics  . Smoking status: Former Smoker    Years: 10.00    Types: Cigarettes    Quit date: 2003  . Smokeless tobacco: Never Used  . Alcohol use 0.0 oz/week     Comment: rarely - maybe 1 drink per month     FAMILY HISTORY: Family History  Problem Relation Age of Onset  . Cancer Mother     squamous cell carcinoma  . Hyperlipidemia Mother   . Depression Mother   . Anxiety disorder Mother   . Obesity Mother   . Heart disease Father     cardiomegaly, CHF  . Hyperlipidemia Father   . Hypertension Father   . Mental retardation Father   . High blood pressure Father   . Depression Father   . Anxiety disorder Father   . Obesity Father   . Cancer Maternal Grandmother   . Diabetes Maternal Grandmother   . Heart disease Maternal Grandmother   . Hyperlipidemia Maternal Grandmother   . Hypertension Maternal Grandmother   . Stroke Maternal Grandmother   . Heart disease Paternal Grandmother   . Hypertension Paternal Grandmother   . Heart disease Paternal Grandfather   . Hyperlipidemia Paternal Grandfather   . Mental illness Paternal Grandfather   . Breast cancer Maternal Aunt   . Thyroid cancer Paternal Aunt     ROS: Review of Systems  Constitutional: Positive for weight loss.  Gastrointestinal: Negative for nausea and vomiting.  Endo/Heme/Allergies:       Negative hypoglycemia  Psychiatric/Behavioral:       Stress    PHYSICAL EXAM: Blood pressure 138/80, pulse 86, temperature 97.8 F (36.6 C), temperature source Oral, height 5\' 5"  (1.651 m), weight 260 lb (117.9 kg), SpO2 95 %. Body mass index is 43.27 kg/m. Physical Exam  Constitutional: She is oriented to person, place, and time. She appears well-developed and well-nourished.  Cardiovascular: Normal  rate.   Pulmonary/Chest: Effort normal.  Musculoskeletal: Normal range of motion.  Neurological: She is oriented to person, place, and time.  Skin: Skin is warm and dry.  Psychiatric: She has a normal mood and affect. Her behavior is normal.  Vitals reviewed.   RECENT LABS AND TESTS: BMET    Component Value Date/Time   NA 139 11/12/2016 1651   K 3.9 11/12/2016 1651   CL 105  11/12/2016 1651   CO2 23 11/12/2016 1651   GLUCOSE 85 11/12/2016 1651   GLUCOSE 87 06/27/2016 0933   BUN 13 11/12/2016 1651   CREATININE 0.94 11/12/2016 1651   CREATININE 0.95 06/27/2016 0933   CALCIUM 8.3 (L) 11/12/2016 1651   GFRNONAA 76 11/12/2016 1651   GFRAA 87 11/12/2016 1651   Lab Results  Component Value Date   HGBA1C 5.7 (H) 08/31/2016   HGBA1C 5.6 12/01/2015   Lab Results  Component Value Date   INSULIN 17.4 08/31/2016   CBC    Component Value Date/Time   WBC 7.9 11/12/2016 1651   WBC 8.1 06/27/2016 0941   WBC 9.9 04/30/2016 0947   RBC 4.36 11/12/2016 1651   RBC 3.96 (A) 06/27/2016 0941   RBC 4.15 04/30/2016 0947   HGB 11.5 (A) 06/27/2016 0941   HGB 11.7 04/30/2016 0947   HCT 36.5 11/12/2016 1651   PLT 332 11/12/2016 1651   MCV 84 11/12/2016 1651   MCH 27.3 11/12/2016 1651   MCH 29.0 06/27/2016 0941   MCH 28.2 04/30/2016 0947   MCHC 32.6 11/12/2016 1651   MCHC 34.5 06/27/2016 0941   MCHC 33.2 04/30/2016 0947   RDW 15.5 (H) 11/12/2016 1651   LYMPHSABS 2.2 11/12/2016 1651   MONOABS 891 04/30/2016 0947   EOSABS 0.1 11/12/2016 1651   BASOSABS 0.0 11/12/2016 1651   Iron/TIBC/Ferritin/ %Sat    Component Value Date/Time   IRON 53 08/31/2016 1644   TIBC 280 08/31/2016 1644   FERRITIN 36 08/31/2016 1644   IRONPCTSAT 19 08/31/2016 1644   Lipid Panel     Component Value Date/Time   CHOL 148 08/31/2016 1644   TRIG 161 (H) 08/31/2016 1644   HDL 44 08/31/2016 1644   CHOLHDL 4.1 12/01/2015 0914   VLDL 37 (H) 12/01/2015 0914   LDLCALC 72 08/31/2016 1644   Hepatic Function  Panel     Component Value Date/Time   PROT 7.4 08/31/2016 1644   ALBUMIN 4.0 08/31/2016 1644   AST 24 08/31/2016 1644   ALT 18 08/31/2016 1644   ALKPHOS 90 08/31/2016 1644   BILITOT 0.2 08/31/2016 1644      Component Value Date/Time   TSH 1.620 08/31/2016 1644   TSH 1.83 12/01/2015 0914    ASSESSMENT AND PLAN: Prediabetes - Plan: metFORMIN (GLUCOPHAGE) 500 MG tablet  Binge eating  Morbid obesity ([place])  PLAN:  Pre-Diabetes Ryne will continue to work on weight loss, exercise, and decreasing simple carbohydrates in her diet to help decrease the risk of diabetes. We dicussed metformin including benefits and risks. She was informed that eating too many simple carbohydrates or too many calories at one sitting increases the likelihood of GI side effects. Courtany agreed to continue to take metformin for now and a prescription was written today for 1 month refill. Jolonda agreed to follow up with [name] as directed to monitor her progress.  Binge Eating Haleigh agrees to work on cognitive behavioral therapy to help decrease emotional eating and will follow up with our clinic in 2 weeks.  Obesity Jineen is currently in the action stage of change. As such, her goal is to maintain weight over the next 2 weeks. She has agreed to portion control better and make smarter food choices, such as increase vegetables and decrease simple carbohydrates  Elizebeth has been instructed to work up to a goal of 150 minutes of combined cardio and strengthening exercise per week for weight loss and overall health benefits. We discussed the following Behavioral Modification  Stratagies today: increasing lean protein intake, decreasing simple carbohydrates , increasing vegetables and emotional eating strategies  Monzerat has agreed to follow up with our clinic in 2 weeks. She was informed of the importance of frequent follow up visits to maximize her success with intensive lifestyle modifications for her multiple  health conditions.  I, Doreene Nest, am acting as scribe for Dennard Nip, MD  I have reviewed the above documentation for accuracy and completeness, and I agree with the above. -Dennard Nip, MD

## 2016-11-17 NOTE — Progress Notes (Signed)
Caroline Gonzales reports that she vomits profusely after surgery.  I requested records from [facility] in [facility].

## 2016-11-18 ENCOUNTER — Ambulatory Visit (HOSPITAL_COMMUNITY): Payer: 59 | Admitting: Anesthesiology

## 2016-11-18 ENCOUNTER — Ambulatory Visit (HOSPITAL_COMMUNITY)
Admission: RE | Admit: 2016-11-18 | Discharge: 2016-11-18 | Disposition: A | Discharge status: Home / Self Care | Payer: 59 | Source: Ambulatory Visit | Attending: Podiatry | Admitting: Podiatry

## 2016-11-18 ENCOUNTER — Encounter (HOSPITAL_COMMUNITY): Payer: Self-pay | Admitting: *Deleted

## 2016-11-18 ENCOUNTER — Encounter (HOSPITAL_COMMUNITY)
Admission: RE | Disposition: A | Discharge status: Home / Self Care | Payer: Self-pay | Source: Ambulatory Visit | Attending: Podiatry

## 2016-11-18 DIAGNOSIS — Z87891 Personal history of nicotine dependence: Secondary | ICD-10-CM | POA: Diagnosis not present

## 2016-11-18 DIAGNOSIS — F418 Other specified anxiety disorders: Secondary | ICD-10-CM | POA: Insufficient documentation

## 2016-11-18 DIAGNOSIS — M79672 Pain in left foot: Secondary | ICD-10-CM | POA: Diagnosis not present

## 2016-11-18 DIAGNOSIS — M795 Residual foreign body in soft tissue: Secondary | ICD-10-CM | POA: Insufficient documentation

## 2016-11-18 DIAGNOSIS — Z6841 Body Mass Index (BMI) 40.0 and over, adult: Secondary | ICD-10-CM | POA: Diagnosis not present

## 2016-11-18 DIAGNOSIS — F319 Bipolar disorder, unspecified: Secondary | ICD-10-CM | POA: Diagnosis not present

## 2016-11-18 DIAGNOSIS — L923 Foreign body granuloma of the skin and subcutaneous tissue: Secondary | ICD-10-CM | POA: Diagnosis not present

## 2016-11-18 DIAGNOSIS — S90852A Superficial foreign body, left foot, initial encounter: Secondary | ICD-10-CM | POA: Diagnosis not present

## 2016-11-18 HISTORY — DX: Other complications of anesthesia, initial encounter: T88.59XA

## 2016-11-18 HISTORY — PX: FOREIGN BODY REMOVAL: SHX962

## 2016-11-18 HISTORY — DX: Nausea with vomiting, unspecified: R11.2

## 2016-11-18 HISTORY — DX: Other specified postprocedural states: Z98.890

## 2016-11-18 HISTORY — DX: Pneumonia, unspecified organism: J18.9

## 2016-11-18 HISTORY — DX: Post-traumatic stress disorder, unspecified: F43.10

## 2016-11-18 HISTORY — DX: Adverse effect of unspecified anesthetic, initial encounter: T41.45XA

## 2016-11-18 HISTORY — DX: Prediabetes: R73.03

## 2016-11-18 LAB — GLUCOSE, CAPILLARY
Glucose-Capillary: 81 mg/dL (ref 65–99)
Glucose-Capillary: 83 mg/dL (ref 65–99)

## 2016-11-18 LAB — HCG, SERUM, QUALITATIVE: Preg, Serum: NEGATIVE

## 2016-11-18 SURGERY — REMOVAL FOREIGN BODY EXTREMITY
Anesthesia: Monitor Anesthesia Care | Site: Foot | Laterality: Left

## 2016-11-18 MED ORDER — FENTANYL CITRATE (PF) 100 MCG/2ML IJ SOLN
INTRAMUSCULAR | Status: DC | PRN
  Administered 2016-11-18 (×2): 50 ug via INTRAVENOUS

## 2016-11-18 MED ORDER — FENTANYL CITRATE (PF) 100 MCG/2ML IJ SOLN
INTRAMUSCULAR | Status: AC
  Filled 2016-11-18: qty 2

## 2016-11-18 MED ORDER — ONDANSETRON HCL 4 MG/2ML IJ SOLN
INTRAMUSCULAR | Status: DC | PRN
  Administered 2016-11-18: 4 mg via INTRAVENOUS

## 2016-11-18 MED ORDER — CLINDAMYCIN PHOSPHATE 600 MG/50ML IV SOLN
600.0000 mg | Freq: Once | INTRAVENOUS | Status: AC
  Administered 2016-11-18: 600 mg via INTRAVENOUS
  Filled 2016-11-18: qty 50

## 2016-11-18 MED ORDER — BUPIVACAINE HCL (PF) 0.5 % IJ SOLN
INTRAMUSCULAR | Status: AC
  Filled 2016-11-18: qty 30

## 2016-11-18 MED ORDER — LIDOCAINE HCL 2 % IJ SOLN
INTRAMUSCULAR | Status: AC
  Filled 2016-11-18: qty 20

## 2016-11-18 MED ORDER — LIDOCAINE HCL 2 % IJ SOLN
INTRAMUSCULAR | Status: DC | PRN
  Administered 2016-11-18: 20 mL

## 2016-11-18 MED ORDER — PROMETHAZINE HCL 25 MG PO TABS: 25.0000 mg | ORAL_TABLET | Freq: Three times a day (TID) | ORAL | 0 refills | Status: DC | PRN

## 2016-11-18 MED ORDER — HYDROMORPHONE HCL 1 MG/ML IJ SOLN: 0.2500 mg | INTRAMUSCULAR | Status: DC | PRN

## 2016-11-18 MED ORDER — PROPOFOL 10 MG/ML IV BOLUS
INTRAVENOUS | Status: AC
  Filled 2016-11-18: qty 20

## 2016-11-18 MED ORDER — BUPIVACAINE HCL (PF) 0.5 % IJ SOLN
INTRAMUSCULAR | Status: DC | PRN
  Administered 2016-11-18: 30 mL

## 2016-11-18 MED ORDER — MIDAZOLAM HCL 2 MG/2ML IJ SOLN
INTRAMUSCULAR | Status: AC
  Filled 2016-11-18: qty 2

## 2016-11-18 MED ORDER — OXYCODONE-ACETAMINOPHEN 5-325 MG PO TABS: 1.0000 | ORAL_TABLET | Freq: Four times a day (QID) | ORAL | 0 refills | Status: DC | PRN

## 2016-11-18 MED ORDER — LACTATED RINGERS IV SOLN
INTRAVENOUS | Status: DC
  Administered 2016-11-18 (×2): via INTRAVENOUS

## 2016-11-18 MED ORDER — 0.9 % SODIUM CHLORIDE (POUR BTL) OPTIME
TOPICAL | Status: DC | PRN
  Administered 2016-11-18: 1000 mL

## 2016-11-18 MED ORDER — CLINDAMYCIN HCL 300 MG PO CAPS: 300.0000 mg | ORAL_CAPSULE | Freq: Three times a day (TID) | ORAL | 0 refills | Status: DC

## 2016-11-18 MED ORDER — SCOPOLAMINE 1 MG/3DAYS TD PT72
1.0000 | MEDICATED_PATCH | TRANSDERMAL | Status: DC
  Administered 2016-11-18: 1.5 mg via TRANSDERMAL

## 2016-11-18 MED ORDER — MIDAZOLAM HCL 5 MG/5ML IJ SOLN
INTRAMUSCULAR | Status: DC | PRN
  Administered 2016-11-18: 2 mg via INTRAVENOUS

## 2016-11-18 MED ORDER — DEXAMETHASONE SODIUM PHOSPHATE 10 MG/ML IJ SOLN
INTRAMUSCULAR | Status: DC | PRN
  Administered 2016-11-18: 10 mg via INTRAVENOUS

## 2016-11-18 MED ORDER — PROMETHAZINE HCL 25 MG/ML IJ SOLN: 6.2500 mg | INTRAMUSCULAR | Status: DC | PRN

## 2016-11-18 MED ORDER — PROPOFOL 10 MG/ML IV BOLUS
INTRAVENOUS | Status: DC | PRN
  Administered 2016-11-18 (×2): 15 mg via INTRAVENOUS

## 2016-11-18 MED ORDER — PROPOFOL 500 MG/50ML IV EMUL
INTRAVENOUS | Status: DC | PRN
  Administered 2016-11-18: 100 ug/kg/min via INTRAVENOUS

## 2016-11-18 MED ORDER — SCOPOLAMINE 1 MG/3DAYS TD PT72
MEDICATED_PATCH | TRANSDERMAL | Status: DC
Start: 2016-11-18 — End: 2016-11-18
  Filled 2016-11-18: qty 1

## 2016-11-18 MED ORDER — CHLORHEXIDINE GLUCONATE CLOTH 2 % EX PADS: 6.0000 | MEDICATED_PAD | Freq: Once | CUTANEOUS | Status: DC

## 2016-11-18 MED ORDER — LIDOCAINE HCL (CARDIAC) 20 MG/ML IV SOLN
INTRAVENOUS | Status: DC | PRN
  Administered 2016-11-18: 100 mg via INTRATRACHEAL

## 2016-11-18 MED FILL — CLINDAMYCIN HCL 300 MG CAPS: 300 | 7 days supply | Qty: 21 | Fill #0

## 2016-11-18 MED FILL — PROMETHAZINE 25 MG TABLET: 25 | 6 days supply | Qty: 20 | Fill #0

## 2016-11-18 MED FILL — OXYCODONE W/APAP 5/325 TAB: 5-325 | 4 days supply | Qty: 30 | Fill #0

## 2016-11-18 SURGICAL SUPPLY — 42 items
BANDAGE ACE 3X5.8 VEL STRL LF (GAUZE/BANDAGES/DRESSINGS) ×2 IMPLANT
BLADE LONG MED 31X9 (MISCELLANEOUS) IMPLANT
BNDG CONFORM 2 STRL LF (GAUZE/BANDAGES/DRESSINGS) IMPLANT
BNDG ESMARK 4X9 LF (GAUZE/BANDAGES/DRESSINGS) IMPLANT
BNDG GAUZE ELAST 4 BULKY (GAUZE/BANDAGES/DRESSINGS) ×2 IMPLANT
CONT SPEC 4OZ CLIKSEAL STRL BL (MISCELLANEOUS) ×2 IMPLANT
COVER SURGICAL LIGHT HANDLE (MISCELLANEOUS) ×2 IMPLANT
CUFF TOURNIQUET SINGLE 18IN (TOURNIQUET CUFF) IMPLANT
DRAPE OEC MINIVIEW 54X84 (DRAPES) ×2 IMPLANT
DRSG EMULSION OIL 3X3 NADH (GAUZE/BANDAGES/DRESSINGS) ×2 IMPLANT
DURAPREP 26ML APPLICATOR (WOUND CARE) ×2 IMPLANT
ELECT REM PT RETURN 9FT ADLT (ELECTROSURGICAL) ×2
ELECTRODE REM PT RTRN 9FT ADLT (ELECTROSURGICAL) ×1 IMPLANT
GAUZE SPONGE 4X4 12PLY STRL (GAUZE/BANDAGES/DRESSINGS) IMPLANT
GLOVE BIO SURGEON STRL SZ7.5 (GLOVE) IMPLANT
GLOVE BIOGEL PI IND STRL 7.0 (GLOVE) ×3 IMPLANT
GLOVE BIOGEL PI IND STRL 7.5 (GLOVE) ×1 IMPLANT
GLOVE BIOGEL PI INDICATOR 7.0 (GLOVE) ×3
GLOVE BIOGEL PI INDICATOR 7.5 (GLOVE) ×1
GLOVE SURG SS PI 7.0 STRL IVOR (GLOVE) ×2 IMPLANT
GLOVE SURG SS PI 7.5 STRL IVOR (GLOVE) ×2 IMPLANT
GOWN STRL REUS W/ TWL LRG LVL3 (GOWN DISPOSABLE) ×1 IMPLANT
GOWN STRL REUS W/ TWL XL LVL3 (GOWN DISPOSABLE) ×1 IMPLANT
GOWN STRL REUS W/TWL LRG LVL3 (GOWN DISPOSABLE) ×1
GOWN STRL REUS W/TWL XL LVL3 (GOWN DISPOSABLE) ×1
KIT BASIN OR (CUSTOM PROCEDURE TRAY) ×2 IMPLANT
NDL SAFETY ECLIPSE 18X1.5 (NEEDLE) IMPLANT
NEEDLE HYPO 18GX1.5 SHARP (NEEDLE)
NEEDLE HYPO 25GX1X1/2 BEV (NEEDLE) ×4 IMPLANT
NEEDLE HYPO 25X1 1.5 SAFETY (NEEDLE) IMPLANT
NS IRRIG 1000ML POUR BTL (IV SOLUTION) IMPLANT
PACK ORTHO EXTREMITY (CUSTOM PROCEDURE TRAY) IMPLANT
PADDING CAST ABS 4INX4YD NS (CAST SUPPLIES)
PADDING CAST ABS COTTON 4X4 ST (CAST SUPPLIES) IMPLANT
SPONGE GAUZE 4X4 12PLY STER LF (GAUZE/BANDAGES/DRESSINGS) ×2 IMPLANT
SUT ETHILON 3 0 FSL (SUTURE) ×2 IMPLANT
SUT MNCRL AB 3-0 PS2 18 (SUTURE) ×2 IMPLANT
SUT MNCRL AB 4-0 PS2 18 (SUTURE) IMPLANT
SUT MON AB 5-0 PS2 18 (SUTURE) IMPLANT
SUT PROLENE 4 0 PS 2 18 (SUTURE) IMPLANT
SYR 10ML LL (SYRINGE) ×2 IMPLANT
UNDERPAD 30X30 (UNDERPADS AND DIAPERS) ×2 IMPLANT

## 2016-11-18 NOTE — Transfer of Care (Signed)
Immediate Anesthesia Transfer of Care Note  Patient: Caroline Gonzales  Procedure(s) Performed: Procedure(s): REMOVAL FOREIGN BODY EXTREMITY LEFT FOOT (Left)  Patient Location: PACU  Anesthesia Type:MAC  Level of Consciousness: awake, alert , oriented and patient cooperative  Airway & Oxygen Therapy: Patient Spontanous Breathing and Patient connected to nasal cannula oxygen  Post-op Assessment: Report given to RN and Post -op Vital signs reviewed and stable  Post vital signs: Reviewed and stable  Last Vitals:  Vitals:   11/18/16 0919 11/18/16 1332  BP: 128/84   Pulse: 80   Resp: 18   Temp: 36.4 C (P) 36.1 C    Last Pain:  Vitals:   11/18/16 0919  TempSrc: Oral      Patients Stated Pain Goal: 6 (79/98/72 1587)  Complications: No apparent anesthesia complications

## 2016-11-18 NOTE — Anesthesia Postprocedure Evaluation (Addendum)
Anesthesia Post Note  Patient: Caroline Gonzales  Procedure(s) Performed: Procedure(s) (LRB): REMOVAL FOREIGN BODY EXTREMITY LEFT FOOT (Left)  Patient location during evaluation: PACU Anesthesia Type: MAC Level of consciousness: awake and alert Pain management: pain level controlled Vital Signs Assessment: post-procedure vital signs reviewed and stable Respiratory status: spontaneous breathing and respiratory function stable Cardiovascular status: stable Anesthetic complications: no       Last Vitals:  Vitals:   11/18/16 1347 11/18/16 1354  BP: 124/74 124/74  Pulse: 71 67  Resp:    Temp:      Last Pain:  Vitals:   11/18/16 0919  TempSrc: Oral                 Cam Dauphin DANIEL

## 2016-11-18 NOTE — Anesthesia Preprocedure Evaluation (Addendum)
Anesthesia Evaluation  Patient identified by MRN, date of birth, ID band Patient awake    Reviewed: Allergy & Precautions, NPO status , Patient's Chart, lab work & pertinent test results  History of Anesthesia Complications (+) PONV and history of anesthetic complications  Airway Mallampati: II  TM Distance: >3 FB Neck ROM: Full    Dental no notable dental hx. (+) Dental Advisory Given   Pulmonary former smoker,    Pulmonary exam normal        Cardiovascular Normal cardiovascular exam     Neuro/Psych PSYCHIATRIC DISORDERS Anxiety Depression Bipolar Disorder negative neurological ROS     GI/Hepatic Neg liver ROS, GERD  ,  Endo/Other  Morbid obesity  Renal/GU negative Renal ROS     Musculoskeletal   Abdominal   Peds  Hematology   Anesthesia Other Findings   Reproductive/Obstetrics                            Anesthesia Physical Anesthesia Plan  ASA: III  Anesthesia Plan: MAC   Post-op Pain Management:    Induction:   Airway Management Planned: Natural Airway and Simple Face Mask  Additional Equipment:   Intra-op Plan:   Post-operative Plan:   Informed Consent: I have reviewed the patients History and Physical, chart, labs and discussed the procedure including the risks, benefits and alternatives for the proposed anesthesia with the patient or authorized representative who has indicated his/her understanding and acceptance.   Dental advisory given  Plan Discussed with: CRNA, Anesthesiologist and Surgeon  Anesthesia Plan Comments:        Anesthesia Quick Evaluation

## 2016-11-18 NOTE — Brief Op Note (Signed)
11/18/2016  1:26 PM  PATIENT:  Caroline Gonzales  41 y.o. female  PRE-OPERATIVE DIAGNOSIS:  FOREIGN BODY IN LEFT FOOT  POST-OPERATIVE DIAGNOSIS:  FOREIGN BODY IN LEFT FOOT  PROCEDURE:  Procedure(s): REMOVAL FOREIGN BODY EXTREMITY LEFT FOOT (Left)  SURGEON:  Surgeon(s) and Role:    * Trula Slade, DPM - Primary  PHYSICIAN ASSISTANT:   ASSISTANTS: none   ANESTHESIA:   MAC  EBL:  No intake/output data recorded.  BLOOD ADMINISTERED:none  DRAINS: none   LOCAL MEDICATIONS USED:  OTHER 20 cc 1:1 mix of 2% lidocaine plain and 0.5% marcaine plain  SPECIMEN:  Source of Specimen:  foregin body left foot for pathology  DISPOSITION OF SPECIMEN:  PATHOLOGY  COUNTS:  YES  TOURNIQUET:   Total Tourniquet Time Documented: Calf (Left) - 21 minutes Total: Calf (Left) - 21 minutes   DICTATION: .Viviann Spare Dictation  PLAN OF CARE: Discharge to home after PACU  PATIENT DISPOSITION:  PACU - hemodynamically stable.   Delay start of Pharmacological VTE agent (>24hrs) due to surgical blood loss or risk of bleeding: no

## 2016-11-18 NOTE — Discharge Instructions (Signed)
Resume all medications as previous See written instructions

## 2016-11-18 NOTE — H&P (Signed)
  Exam limited to lower extremity. Full H&P in the chart.   Subjective: Caroline Gonzales presents to the hospital for surgical removal of retained hardware which has been present for several years. Recently it has started to be painful and she can feel it more and she is requesting it be removed. Denies any recent injury.  Denies any systemic complaints such as fevers, chills, nausea, vomiting. No acute changes since last appointment, and no other complaints at this time.   Objective: AAO x3, NAD DP/PT pulses palpable bilaterally, CRT less than 3 seconds Pain on the left foot in the first interspace and submetatarsal 2 area. There is no puncture wound present. No swelling, redness, fluctuance, crepitance. No pinpoint tenderness.  No edema, erythema, increase in warmth to bilateral lower extremities.  No open lesions or pre-ulcerative lesions.  No pain with calf compression, swelling, warmth, erythema  Assessment: Retained foreign body left foot  Plan: -All treatment options discussed with the patient including all alternatives, risks, complications.  -Again discussed all alternatives, risks, complications of surgery and she wishes to proceed. Surgical consent signed -NPO since midnight -Discussed postop course with her as well again today.  -Clindamycin preop.  -Patient encouraged to call the office with any questions, concerns, change in symptoms.   Celesta Gentile, DPM

## 2016-11-19 ENCOUNTER — Encounter (HOSPITAL_COMMUNITY): Payer: Self-pay | Admitting: Podiatry

## 2016-11-22 NOTE — Op Note (Signed)
PATIENT:  Caroline Gonzales  41 y.o. female  PRE-OPERATIVE DIAGNOSIS:  FOREIGN BODY IN LEFT FOOT  POST-OPERATIVE DIAGNOSIS:  FOREIGN BODY IN LEFT FOOT  PROCEDURE:  Procedure(s): REMOVAL FOREIGN BODY EXTREMITY LEFT FOOT (Left)  SURGEON:  Surgeon(s) and Role:    * Trula Slade, DPM - Primary  PHYSICIAN ASSISTANT:   ASSISTANTS: none   ANESTHESIA:   MAC  EBL:  No intake/output data recorded.  BLOOD ADMINISTERED:none  DRAINS: none   LOCAL MEDICATIONS USED:  OTHER 20 cc 1:1 mix of 2% lidocaine plain and 0.5% marcaine plain  SPECIMEN:  Source of Specimen:  foregin body left foot for pathology  DISPOSITION OF SPECIMEN:  PATHOLOGY  COUNTS:  YES  TOURNIQUET:   Total Tourniquet Time Documented: Calf (Left) - 21 minutes Total: Calf (Left) - 21 minutes   DICTATION: .Viviann Spare Dictation  PLAN OF CARE: Discharge to home after PACU  PATIENT DISPOSITION:  PACU - hemodynamically stable.   Delay start of Pharmacological VTE agent (>24hrs) due to surgical blood loss or risk of bleeding: no   Indications for surgery: Ms. Broski presented to the office with concerns of pain to her left foot. She relates a history of stepping on a. Recently she states that she has had some pain to her foot and she feels that the object is moving and she has a sharp pain to the area. She would like to have the foreign body removed. I discussed with her surgical intervention including alternatives, risks, complications. No promises or guarantees were given as the outcome of the procedure and all questions were answered to the best of my ability.  Procedure in detail: The patient was both verbally and visually identified by myself, the nursing staff, and anesthesia staff. She was then transferred to the operative room via stretcher complaints in the operative table in supine position.A well-padded ankle tourniquet was applied. The left lower extremity is scrubbed, prepped, draped in  the normal sterile fashion. The left lower extremity was exsanguinated with Esmarch bandage the pneumatic ankle tourniquet was inflated to 250 mm Hg.   Fluoroscopy was utilized to confirm the foreign body. Once the area was identified and linear incision is made in the plantar aspect of the foot along the area of the formed body. The incision was made with a 15 blade scalpel to the cutaneous tissues were then bluntly sharply dissected making sure to retract all vital neurovascular structures.under/3 crepitus to pieces of foreign material were identified and these were both removed and sent to pathology for evaluation. There is no purulence or pockets of abscess and the tissue appeared to be healthy. The incision was copiously irrigated with sterile saline and hemostasis was achieved. The incision was closed in layered fashion the subcutaneous tissue monofilament skin was then closed with 3-0 nylon. Adaptic was applied followed by a dry, sterile dressing. The tourniquet was released and there is found to be an ary refill on all the digits. She was awoken from anesthesia and found to have toleratedthe the procedure well any complications. She was transferred to PACU with vital signs stable and vascular status intact.  Celesta Gentile, DPM

## 2016-11-23 ENCOUNTER — Encounter: Payer: Self-pay | Admitting: Podiatry

## 2016-11-23 ENCOUNTER — Ambulatory Visit (INDEPENDENT_AMBULATORY_CARE_PROVIDER_SITE_OTHER): Payer: 59

## 2016-11-23 ENCOUNTER — Ambulatory Visit (INDEPENDENT_AMBULATORY_CARE_PROVIDER_SITE_OTHER): Payer: Self-pay | Admitting: Podiatry

## 2016-11-23 VITALS — Temp 98.5°F

## 2016-11-23 DIAGNOSIS — M795 Residual foreign body in soft tissue: Secondary | ICD-10-CM

## 2016-11-23 DIAGNOSIS — Z9889 Other specified postprocedural states: Secondary | ICD-10-CM

## 2016-11-23 MED ORDER — PROMETHAZINE HCL 25 MG PO TABS
25.0000 mg | ORAL_TABLET | Freq: Three times a day (TID) | ORAL | 0 refills | Status: DC | PRN
Start: 2016-11-23 — End: 2017-03-29

## 2016-11-24 NOTE — Progress Notes (Signed)
Subjective: Caroline Gonzales is a 41 y.o. is seen today in office s/p foreign body excision preformed on 11/18/16. They state their pain is controlled currently. She is taking pain medication at night if needed but not during the day. She has remained in the surgical shoe. The antibiotics are making her nauseous. Denies any systemic complaints such as fevers, chills, nausea, vomiting. No calf pain, chest pain, shortness of breath.   Objective: General: No acute distress, AAOx3  DP/PT pulses palpable 2/4, CRT < 3 sec to all digits.  Protective sensation intact. Motor function intact.  Left foot: Incision is well coapted without any evidence of dehiscence. The inferior suture did come out when removing the sutures but the incision is healing well. There is no surrounding erythema, ascending cellulitis, fluctuance, crepitus, malodor, drainage/purulence. There is minimal edema around the surgical site. There is no pain along the surgical site.  No other areas of tenderness to bilateral lower extremities.  No other open lesions or pre-ulcerative lesions.  No pain with calf compression, swelling, warmth, erythema.   Assessment and Plan:  Status post foreign body excision, doing well with no complications   -Treatment options discussed including all alternatives, risks, and complications -X-rays were obtained and reviewed with the patient. Foreign body has been removed.  -Antibiotic ointment was applied followed by a dry sterile dressing. Keep clean, dry, intact.  -Continue surgical shoe.  -Ice/elevation -Pain medication as needed. Discussed she cannot work or drive when taking pain medication.  -Monitor for any clinical signs or symptoms of infection and DVT/PE and directed to call the office immediately should any occur or go to the ER. -Follow-up in 10 days for suture removal or sooner if any problems arise. In the meantime, encouraged to call the office with any questions, concerns, change in  symptoms.   Celesta Gentile, DP

## 2016-11-26 ENCOUNTER — Encounter (INDEPENDENT_AMBULATORY_CARE_PROVIDER_SITE_OTHER): Payer: Self-pay | Admitting: Family Medicine

## 2016-11-26 NOTE — Telephone Encounter (Signed)
Hi,  Caroline Gonzales needs to reschedule her Tuesday appointment.

## 2016-11-28 ENCOUNTER — Encounter: Payer: Self-pay | Admitting: Sports Medicine

## 2016-11-28 NOTE — Postoperative (Signed)
Patient called stating that she had removal of foreign body done with sutures placed and when she changed the dressing all the sutures came out except one at the proximal aspect. Patient denies any symptoms or local signs of infection. Patient had steristrips at home so I advised her to place steristrips to site and apply Kerlix and Conan dressing. Patient to call office on Monday for a wound check appointment with Dr. Jacqualyn Posey. I encour aged patient to call back if there are any other issues.  -Dr. Cannon Kettle

## 2016-11-30 ENCOUNTER — Ambulatory Visit (INDEPENDENT_AMBULATORY_CARE_PROVIDER_SITE_OTHER): Payer: Self-pay | Admitting: Podiatry

## 2016-11-30 ENCOUNTER — Encounter: Payer: Self-pay | Admitting: Podiatry

## 2016-11-30 DIAGNOSIS — Z9889 Other specified postprocedural states: Secondary | ICD-10-CM

## 2016-11-30 DIAGNOSIS — M795 Residual foreign body in soft tissue: Secondary | ICD-10-CM

## 2016-11-30 NOTE — Progress Notes (Signed)
Subjective: Caroline Gonzales is a 41 y.o. is seen today in office s/p foreign body excision preformed on 11/18/16. She presents today as her stitches came out of the weekend. She states that she's had no pain or pain that she was having prior to surgery has resolved. She has notany drainage coming from the wound and she denies any swelling or redness or any warmth or any red streaks. Overall she states that she is happy she is doing well.. Denies any systemic complaints such as fevers, chills, nausea, vomiting. No calf pain, chest pain, shortness of breath.   Objective: General: No acute distress, AAOx3  DP/PT pulses palpable 2/4, CRT < 3 sec to all digits.  Protective sensation intact. Motor function intact.  Left foot: Incision is well coapted without any evidence of dehiscence. A single suture remains. After removal the incision is well coapted. The incision appears to be healed. There is no pain in the surgical site is no swelling, redness. There is no drainage or pus or any signs of infection. There is no pain to the foot. No other areas of tenderness to bilateral lower extremities.  No other open lesions or pre-ulcerative lesions.  No pain with calf compression, swelling, warmth, erythema.   Assessment and Plan:  Status post foreign body excision, doing well with no complications   -Treatment options discussed including all alternatives, risks, and complications -The last sutures removed today. Incision is healing very well. At this point she can start to shower get incision wet. She can start to transition to regular shoe as tolerated. Look at her foot daily and is any problem is to call the office. At this point she is doing well she's having no problems, discharge her from a postoperative course however I encouraged her that it is any concerns or any questions call the office he verbally understood this and agreed to the plan.  Celesta Gentile, DPM

## 2016-12-01 ENCOUNTER — Ambulatory Visit (INDEPENDENT_AMBULATORY_CARE_PROVIDER_SITE_OTHER): Payer: 59 | Admitting: Family Medicine

## 2016-12-05 DIAGNOSIS — F411 Generalized anxiety disorder: Secondary | ICD-10-CM | POA: Diagnosis not present

## 2016-12-07 ENCOUNTER — Other Ambulatory Visit (INDEPENDENT_AMBULATORY_CARE_PROVIDER_SITE_OTHER): Payer: Self-pay

## 2016-12-07 ENCOUNTER — Encounter (INDEPENDENT_AMBULATORY_CARE_PROVIDER_SITE_OTHER): Payer: Self-pay | Admitting: Family Medicine

## 2016-12-07 DIAGNOSIS — R7303 Prediabetes: Secondary | ICD-10-CM

## 2016-12-07 MED ORDER — METFORMIN HCL 500 MG PO TABS
500.0000 mg | ORAL_TABLET | Freq: Two times a day (BID) | ORAL | 0 refills | Status: DC
Start: 2016-12-07 — End: 2016-12-07

## 2016-12-07 MED ORDER — METFORMIN HCL 500 MG PO TABS: 500.0000 mg | ORAL_TABLET | Freq: Two times a day (BID) | ORAL | 0 refills | Status: DC

## 2016-12-07 MED FILL — lamoTRIgine 200 MG TABS: 200 | 30 days supply | Qty: 30 | Fill #11

## 2016-12-07 MED FILL — L-METHYLFOLATE CALCIUM 15 M: 15 | 30 days supply | Qty: 30 | Fill #1

## 2016-12-07 MED FILL — metFORMIN HCL 500 MG TABS: 500 | 30 days supply | Qty: 60 | Fill #0

## 2016-12-07 MED FILL — DYMISTA NASAL SPRAY: 137-50 | 30 days supply | Qty: 23 | Fill #2

## 2016-12-07 MED FILL — QUETIAPINE FUMARATE 400 MG: 400 | 30 days supply | Qty: 45 | Fill #10

## 2016-12-07 MED FILL — PROPRANOLOL 40 MG TABLET: 40 | 30 days supply | Qty: 60 | Fill #4

## 2016-12-08 MED FILL — clonazePAM 1 MG TABS: 1 | 30 days supply | Qty: 60 | Fill #0

## 2016-12-09 ENCOUNTER — Ambulatory Visit (INDEPENDENT_AMBULATORY_CARE_PROVIDER_SITE_OTHER): Payer: 59 | Admitting: Family Medicine

## 2016-12-09 ENCOUNTER — Telehealth: Payer: 59 | Admitting: Family

## 2016-12-09 VITALS — BP 119/79 | HR 84 | Temp 98.7°F | Ht 65.0 in | Wt 260.0 lb

## 2016-12-09 DIAGNOSIS — S7002XA Contusion of left hip, initial encounter: Secondary | ICD-10-CM | POA: Diagnosis not present

## 2016-12-09 DIAGNOSIS — B9789 Other viral agents as the cause of diseases classified elsewhere: Secondary | ICD-10-CM | POA: Diagnosis not present

## 2016-12-09 DIAGNOSIS — F5081 Binge eating disorder: Secondary | ICD-10-CM | POA: Diagnosis not present

## 2016-12-09 DIAGNOSIS — J069 Acute upper respiratory infection, unspecified: Secondary | ICD-10-CM | POA: Diagnosis not present

## 2016-12-09 MED ORDER — BENZONATATE 100 MG PO CAPS: 100.0000 mg | ORAL_CAPSULE | Freq: Three times a day (TID) | ORAL | 0 refills | Status: DC | PRN

## 2016-12-09 MED ORDER — PREDNISONE 10 MG (21) PO TBPK: ORAL_TABLET | ORAL | 0 refills | Status: DC

## 2016-12-09 MED FILL — predniSONE 10 MG TABS: 10 | 6 days supply | Qty: 21 | Fill #0

## 2016-12-09 MED FILL — BENZONATATE 100 MG CAP: 100 | 7 days supply | Qty: 20 | Fill #0

## 2016-12-09 NOTE — Progress Notes (Signed)
Office: [phone]  /  Fax: [phone]   HPI:   Chief Complaint: OBESITY Caroline Gonzales is here to discuss her progress with her obesity treatment plan. She is following her eating plan approximately 10 % of the time and states she is walking 8,000 steps 5 times per week. Caroline Gonzales has done well maintaining her weight loss but has not been doing well following her diet prescription, Topamax didn't help her binge eating disorder. Her weight is 260 lb (117.9 kg) today and has maintained weight over a period of 2 weeks since her last visit. She has lost 18 lbs since starting treatment with [name].  Binge Eating Disorder Caroline Gonzales noted increased sweet cravings after starting Topamax and so she stopped. She did well maintaining her weight and noted binges were at 3 to 5 times per week.   Wt Readings from Last 500 Encounters:  12/09/16 260 lb (117.9 kg)  11/18/16 260 lb (117.9 kg)  11/17/16 260 lb (117.9 kg)  11/12/16 264 lb 6.4 oz (119.9 kg)  11/02/16 261 lb (118.4 kg)  10/15/16 263 lb (119.3 kg)  09/29/16 268 lb (121.6 kg)  09/16/16 271 lb (122.9 kg)  08/31/16 278 lb (126.1 kg)  06/27/16 278 lb (126.1 kg)  06/19/16 282 lb (127.9 kg)  05/13/16 275 lb 6.4 oz (124.9 kg)  04/30/16 279 lb (126.6 kg)  04/29/16 278 lb (126.1 kg)  12/12/15 269 lb (122 kg)  12/01/15 270 lb 6.4 oz (122.7 kg)     ALLERGIES: Allergies  Allergen Reactions  . Tamiflu [Oseltamivir Phosphate] Anaphylaxis  . Cephalosporins Rash  . Latex Rash  . Penicillins Rash    MEDICATIONS: Current Outpatient Prescriptions on File Prior to Visit  Medication Sig Dispense Refill  . cetirizine (ZYRTEC) 10 MG tablet Take 10 mg by mouth at bedtime.    . Cholecalciferol (VITAMIN D3) 5000 units CAPS Take 5,000 Units by mouth at bedtime.    . clindamycin (CLEOCIN) 300 MG capsule Take 1 capsule (300 mg total) by mouth 3 (three) times daily. 21 capsule 0  . clonazePAM (KLONOPIN) 1 MG tablet Take 1 mg by mouth 2 (two) times daily as  needed for anxiety.    [name] ibuprofen (ADVIL,MOTRIN) 200 MG tablet Take 400-600 mg by mouth daily as needed for headache or moderate pain.    . L-methylfolate Calcium 15 MG TABS Take 15 m by mouth daily.    [name] lamoTRIgine (LAMICTAL) 200 MG tablet Take 200 mg by mouth at bedtime.    [name] levonorgestrel (MIRENA) 20 MCG/24HR IUD 1 each by Intrauterine route once.    . loratadine (CLARITIN) 10 MG tablet Take 10 mg by mouth at bedtime.     . metFORMIN (GLUCOPHAGE) 500 MG tablet Take 1 tablet (500 mg total) by mouth 2 (two) times daily with a meal. 60 tablet 0  . Multiple Vitamin (MULTIVITAMIN) tablet Take 1 tablet by mouth Nightly.    [name] oxyCODONE-acetaminophen (ROXICET) 5-325 MG tablet Take 1-2 tablets by mouth every 6 (six) hours as needed for severe pain. 30 tablet 0  . promethazine (PHENERGAN) 25 MG tablet Take 1 tablet (25 mg total) by mouth every 8 (eight) hours as needed for nausea or vomiting. 20 tablet 0  . propranolol (INDERAL) 40 MG tablet Take 40 mg by mouth 2 (two) times daily as needed (anxiety).     . QUEtiapine (SEROQUEL) 400 MG tablet Take 600 mg by mouth at bedtime.     . ranitidine (ZANTAC) 150 MG tablet Take 150 mg by mouth  at bedtime.    . triamcinolone ointment (KENALOG) 0.1 % Apply 1 application topically daily as needed (rash).   3   No current facility-administered medications on file prior to visit.     PAST MEDICAL HISTORY: Past Medical History:  Diagnosis Date  . Allergy    Zyrtec, Allegra.  . Anxiety    followed by Dr. Toy Cookey  . Back pain   . Bipolar 1 disorder ([place])   . Complication of anesthesia   . Depression   . Fibroid   . Gallbladder problem   . Gastroparesis 09/14/2012   gastric emptying study in 2014  . GERD (gastroesophageal reflux disease)   . Pneumonia    2013ish  . PONV (postoperative nausea and vomiting)   . Pre-diabetes   . PTSD (post-traumatic stress disorder)   . Vitamin D deficiency     PAST SURGICAL HISTORY: Past Surgical History:  Procedure  Laterality Date  . CESAREAN SECTION    . CHOLECYSTECTOMY    . DILATION AND CURETTAGE OF UTERUS    . FOREIGN BODY REMOVAL Left 11/18/2016   Procedure: REMOVAL FOREIGN BODY EXTREMITY LEFT FOOT;  Surgeon: Trula Slade, DPM;  Location: [facility];  Service: Podiatry;  Laterality: Left;  . PILONIDAL CYST EXCISION  1990's  . TENDON REPAIR Left    Left Ankle  . WISDOM TOOTH EXTRACTION  19090's    SOCIAL HISTORY: Social History  Substance Use Topics  . Smoking status: Former Smoker    Years: 10.00    Types: Cigarettes    Quit date: 2003  . Smokeless tobacco: Never Used  . Alcohol use 0.0 oz/week     Comment: rarely - maybe 1 drink per month     FAMILY HISTORY: Family History  Problem Relation Age of Onset  . Cancer Mother     squamous cell carcinoma  . Hyperlipidemia Mother   . Depression Mother   . Anxiety disorder Mother   . Obesity Mother   . Heart disease Father     cardiomegaly, CHF  . Hyperlipidemia Father   . Hypertension Father   . Mental retardation Father   . High blood pressure Father   . Depression Father   . Anxiety disorder Father   . Obesity Father   . Cancer Maternal Grandmother   . Diabetes Maternal Grandmother   . Heart disease Maternal Grandmother   . Hyperlipidemia Maternal Grandmother   . Hypertension Maternal Grandmother   . Stroke Maternal Grandmother   . Heart disease Paternal Grandmother   . Hypertension Paternal Grandmother   . Heart disease Paternal Grandfather   . Hyperlipidemia Paternal Grandfather   . Mental illness Paternal Grandfather   . Breast cancer Maternal Aunt   . Thyroid cancer Paternal Aunt     ROS: Review of Systems  Constitutional: Negative for weight loss.  Gastrointestinal:       Negative abdominal cramping  Musculoskeletal:       Negative muscle weakness    PHYSICAL EXAM: Blood pressure 119/79, pulse 84, temperature 98.7 F (37.1 C), height 5\' 5"  (1.651 m), weight 260 lb (117.9 kg), SpO2 98 %. Body mass index is  43.27 kg/m. Physical Exam  Constitutional: She is oriented to person, place, and time. She appears well-developed and well-nourished.  Cardiovascular: Normal rate.   Pulmonary/Chest: Effort normal.  Musculoskeletal: Normal range of motion.  Neurological: She is oriented to person, place, and time.  Skin: Skin is warm and dry.  Vitals reviewed.   RECENT LABS AND TESTS:  BMET    Component Value Date/Time   NA 139 11/12/2016 1651   K 3.9 11/12/2016 1651   CL 105 11/12/2016 1651   CO2 23 11/12/2016 1651   GLUCOSE 85 11/12/2016 1651   GLUCOSE 87 06/27/2016 0933   BUN 13 11/12/2016 1651   CREATININE 0.94 11/12/2016 1651   CREATININE 0.95 06/27/2016 0933   CALCIUM 8.3 (L) 11/12/2016 1651   GFRNONAA 76 11/12/2016 1651   GFRAA 87 11/12/2016 1651   Lab Results  Component Value Date   HGBA1C 5.7 (H) 08/31/2016   HGBA1C 5.6 12/01/2015   Lab Results  Component Value Date   INSULIN 17.4 08/31/2016   CBC    Component Value Date/Time   WBC 7.9 11/12/2016 1651   WBC 8.1 06/27/2016 0941   WBC 9.9 04/30/2016 0947   RBC 4.36 11/12/2016 1651   RBC 3.96 (A) 06/27/2016 0941   RBC 4.15 04/30/2016 0947   HGB 11.5 (A) 06/27/2016 0941   HGB 11.7 04/30/2016 0947   HCT 36.5 11/12/2016 1651   PLT 332 11/12/2016 1651   MCV 84 11/12/2016 1651   MCH 27.3 11/12/2016 1651   MCH 29.0 06/27/2016 0941   MCH 28.2 04/30/2016 0947   MCHC 32.6 11/12/2016 1651   MCHC 34.5 06/27/2016 0941   MCHC 33.2 04/30/2016 0947   RDW 15.5 (H) 11/12/2016 1651   LYMPHSABS 2.2 11/12/2016 1651   MONOABS 891 04/30/2016 0947   EOSABS 0.1 11/12/2016 1651   BASOSABS 0.0 11/12/2016 1651   Iron/TIBC/Ferritin/ %Sat    Component Value Date/Time   IRON 53 08/31/2016 1644   TIBC 280 08/31/2016 1644   FERRITIN 36 08/31/2016 1644   IRONPCTSAT 19 08/31/2016 1644   Lipid Panel     Component Value Date/Time   CHOL 148 08/31/2016 1644   TRIG 161 (H) 08/31/2016 1644   HDL 44 08/31/2016 1644   CHOLHDL 4.1 12/01/2015  0914   VLDL 37 (H) 12/01/2015 0914   LDLCALC 72 08/31/2016 1644   Hepatic Function Panel     Component Value Date/Time   PROT 7.4 08/31/2016 1644   ALBUMIN 4.0 08/31/2016 1644   AST 24 08/31/2016 1644   ALT 18 08/31/2016 1644   ALKPHOS 90 08/31/2016 1644   BILITOT 0.2 08/31/2016 1644      Component Value Date/Time   TSH 1.620 08/31/2016 1644   TSH 1.83 12/01/2015 0914    ASSESSMENT AND PLAN: Binge eating disorder  Morbid obesity ([place])  PLAN:  Binge Eating Disorder Angelo agrees to Cognitive Behavioral Therapy to discuss decreased emotional eating and ways to avoid binging. We will refer to bariatric psychologist when she starts later this year. Caroline Gonzales agrees to follow up with our clinic in 2 weeks.  Obesity Caroline Gonzales is currently in the action stage of change. As such, her goal is to continue with weight loss efforts She has agreed to portion control better and make smarter food choices, such as increase vegetables and decrease simple carbohydrates  and keep a food journal with 1200 to 1500 calories and 80+ grams of protein daily Caroline Gonzales has been instructed to work up to a goal of 150 minutes of combined cardio and strengthening exercise per week for weight loss and overall health benefits. We discussed the following Behavioral Modification Stratagies today: increasing lean protein, emotional eating strategies and decrease ETOH  Caroline Gonzales has agreed to follow up with our clinic in 2 weeks. She was informed of the importance of frequent follow up visits to maximize her success with intensive  lifestyle modifications for her multiple health conditions.  We spent > than 50% of the 30 minute visit on the counseling as documented in the note.   I, Doreene Nest, am acting as scribe for Dennard Nip, MD  I have reviewed the above documentation for accuracy and completeness, and I agree with the above. -Dennard Nip, MD

## 2016-12-09 NOTE — Progress Notes (Signed)
We are sorry that you are not feeling well.  Here is how we plan to help!  Based on what you have shared with me it looks like you have upper respiratory tract inflammation that has resulted in a significant cough.  Inflammation and infection in the upper respiratory tract is commonly called bronchitis and has four common causes:  Allergies, Viral Infections, Acid Reflux and Bacterial Infections.  Allergies, viruses and acid reflux are treated by controlling symptoms or eliminating the cause. An example might be a cough caused by taking certain blood pressure medications. You stop the cough by changing the medication. Another example might be a cough caused by acid reflux. Controlling the reflux helps control the cough.  Based on your presentation I believe you most likely have A cough due to a virus.  This is called viral bronchitis and is best treated by rest, plenty of fluids and control of the cough.  You may use Ibuprofen or Tylenol as directed to help your symptoms.     In addition you may use A non-prescription cough medication called Robitussin DAC. Take 2 teaspoons every 8 hours or Delsym: take 2 teaspoons every 12 hours., A non-prescription cough medication called Mucinex DM: take 2 tablets every 12 hours. and A prescription cough medication called Tessalon Perles 100mg. You may take 1-2 capsules every 8 hours as needed for your cough.  Sterapred 10 mg dosepak  USE OF BRONCHODILATOR ("RESCUE") INHALERS: There is a risk from using your bronchodilator too frequently.  The risk is that over-reliance on a medication which only relaxes the muscles surrounding the breathing tubes can reduce the effectiveness of medications prescribed to reduce swelling and congestion of the tubes themselves.  Although you feel brief relief from the bronchodilator inhaler, your asthma may actually be worsening with the tubes becoming more swollen and filled with mucus.  This can delay other crucial treatments, such as  oral steroid medications. If you need to use a bronchodilator inhaler daily, several times per day, you should discuss this with your provider.  There are probably better treatments that could be used to keep your asthma under control.     HOME CARE . Only take medications as instructed by your medical team. . Complete the entire course of an antibiotic. . Drink plenty of fluids and get plenty of rest. . Avoid close contacts especially the very young and the elderly . Cover your mouth if you cough or cough into your sleeve. . Always remember to wash your hands . A steam or ultrasonic humidifier can help congestion.   GET HELP RIGHT AWAY IF: . You develop worsening fever. . You become short of breath . You cough up blood. . Your symptoms persist after you have completed your treatment plan MAKE SURE YOU   Understand these instructions.  Will watch your condition.  Will get help right away if you are not doing well or get worse.  Your e-visit answers were reviewed by a board certified advanced clinical practitioner to complete your personal care plan.  Depending on the condition, your plan could have included both over the counter or prescription medications. If there is a problem please reply  once you have received a response from your provider. Your safety is important to us.  If you have drug allergies check your prescription carefully.    You can use MyChart to ask questions about today's visit, request a non-urgent call back, or ask for a work or school excuse for 24 hours   related to this e-Visit. If it has been greater than 24 hours you will need to follow up with your provider, or enter a new e-Visit to address those concerns. You will get an e-mail in the next two days asking about your experience.  I hope that your e-visit has been valuable and will speed your recovery. Thank you for using e-visits.   

## 2016-12-10 DIAGNOSIS — F3181 Bipolar II disorder: Secondary | ICD-10-CM | POA: Diagnosis not present

## 2016-12-24 ENCOUNTER — Ambulatory Visit (INDEPENDENT_AMBULATORY_CARE_PROVIDER_SITE_OTHER): Payer: 59 | Admitting: Family Medicine

## 2016-12-24 VITALS — BP 112/72 | HR 79 | Temp 98.0°F | Ht 65.0 in | Wt 263.0 lb

## 2016-12-24 DIAGNOSIS — Z9189 Other specified personal risk factors, not elsewhere classified: Secondary | ICD-10-CM | POA: Diagnosis not present

## 2016-12-24 DIAGNOSIS — R7303 Prediabetes: Secondary | ICD-10-CM

## 2016-12-24 MED ORDER — METFORMIN HCL 500 MG PO TABS: 500.0000 mg | ORAL_TABLET | Freq: Two times a day (BID) | ORAL | 0 refills | Status: DC

## 2016-12-24 NOTE — Progress Notes (Signed)
Office: [phone]  /  Fax: [phone]   HPI:   Chief Complaint: OBESITY Cecil is here to discuss her progress with her obesity treatment plan. She is following her eating plan approximately 50 % of the time and states she is walking/cardio for 30 minutes 1 time per week. Derenda got off track after eating out on vacation and is frustrated she didn't get back on track after she got back and feels she is not doing well with the freedom of the journaling plan. Her weight is 263 lb (119.3 kg) today and has had a weight gain of 3 lbs over a period of 2 weeks since her last visit. She has lost 15 lbs since starting treatment with [name].  Pre-Diabetes Jessice has a diagnosis of prediabetes based on her elevated Hgb A1c at 5.7 and was informed this puts her at greater risk of developing diabetes. She is taking metformin currently and she is struggling to follow her diet prescription recently and has had increased simple carbs. She has had no problems with Metformin. She denies nausea or hypoglycemia.  At risk for diabetes Terianna is at higher than average risk for developing diabetes due to her obesity and pre-diabetes. She currently denies polyuria or polydipsia.   Wt Readings from Last 500 Encounters:  12/24/16 263 lb (119.3 kg)  12/09/16 260 lb (117.9 kg)  11/18/16 260 lb (117.9 kg)  11/17/16 260 lb (117.9 kg)  11/12/16 264 lb 6.4 oz (119.9 kg)  11/02/16 261 lb (118.4 kg)  10/15/16 263 lb (119.3 kg)  09/29/16 268 lb (121.6 kg)  09/16/16 271 lb (122.9 kg)  08/31/16 278 lb (126.1 kg)  06/27/16 278 lb (126.1 kg)  06/19/16 282 lb (127.9 kg)  05/13/16 275 lb 6.4 oz (124.9 kg)  04/30/16 279 lb (126.6 kg)  04/29/16 278 lb (126.1 kg)  12/12/15 269 lb (122 kg)  12/01/15 270 lb 6.4 oz (122.7 kg)     ALLERGIES: Allergies  Allergen Reactions  . Tamiflu [Oseltamivir Phosphate] Anaphylaxis  . Cephalosporins Rash  . Latex Rash  . Penicillins Rash    MEDICATIONS: Current Outpatient  Prescriptions on File Prior to Visit  Medication Sig Dispense Refill  . cetirizine (ZYRTEC) 10 MG tablet Take 10 mg by mouth at bedtime.    . Cholecalciferol (VITAMIN D3) 5000 units CAPS Take 5,000 Units by mouth at bedtime.    . clonazePAM (KLONOPIN) 1 MG tablet Take 1 mg by mouth 2 (two) times daily as needed for anxiety.    [name] ibuprofen (ADVIL,MOTRIN) 200 MG tablet Take 400-600 mg by mouth daily as needed for headache or moderate pain.    . L-methylfolate Calcium 15 MG TABS Take 15 m by mouth daily.    [name] lamoTRIgine (LAMICTAL) 200 MG tablet Take 200 mg by mouth at bedtime.    [name] levonorgestrel (MIRENA) 20 MCG/24HR IUD 1 each by Intrauterine route once.    . loratadine (CLARITIN) 10 MG tablet Take 10 mg by mouth at bedtime.     . Multiple Vitamin (MULTIVITAMIN) tablet Take 1 tablet by mouth Nightly.    . propranolol (INDERAL) 40 MG tablet Take 40 mg by mouth 2 (two) times daily as needed (anxiety).     . QUEtiapine (SEROQUEL) 400 MG tablet Take 600 mg by mouth at bedtime.     . ranitidine (ZANTAC) 150 MG tablet Take 150 mg by mouth at bedtime.    . triamcinolone ointment (KENALOG) 0.1 % Apply 1 application topically daily as needed (rash).  3  . benzonatate (TESSALON PERLES) 100 MG capsule Take 1 capsule (100 mg total) by mouth 3 (three) times daily as needed. (Patient not taking: Reported on 12/24/2016) 20 capsule 0  . clindamycin (CLEOCIN) 300 MG capsule Take 1 capsule (300 mg total) by mouth 3 (three) times daily. (Patient not taking: Reported on 12/24/2016) 21 capsule 0  . oxyCODONE-acetaminophen (ROXICET) 5-325 MG tablet Take 1-2 tablets by mouth every 6 (six) hours as needed for severe pain. (Patient not taking: Reported on 12/24/2016) 30 tablet 0  . predniSONE (STERAPRED UNI-PAK 21 TAB) 10 MG (21) TBPK tablet Use as directed (Patient not taking: Reported on 12/24/2016) 21 tablet 0  . promethazine (PHENERGAN) 25 MG tablet Take 1 tablet (25 mg total) by mouth every 8 (eight) hours as needed for  nausea or vomiting. (Patient not taking: Reported on 12/24/2016) 20 tablet 0   No current facility-administered medications on file prior to visit.     PAST MEDICAL HISTORY: Past Medical History:  Diagnosis Date  . Allergy    Zyrtec, Allegra.  . Anxiety    followed by Dr. Toy Cookey  . Back pain   . Bipolar 1 disorder ([place])   . Complication of anesthesia   . Depression   . Fibroid   . Gallbladder problem   . Gastroparesis 09/14/2012   gastric emptying study in 2014  . GERD (gastroesophageal reflux disease)   . Pneumonia    2013ish  . PONV (postoperative nausea and vomiting)   . Pre-diabetes   . PTSD (post-traumatic stress disorder)   . Vitamin D deficiency     PAST SURGICAL HISTORY: Past Surgical History:  Procedure Laterality Date  . CESAREAN SECTION    . CHOLECYSTECTOMY    . DILATION AND CURETTAGE OF UTERUS    . FOREIGN BODY REMOVAL Left 11/18/2016   Procedure: REMOVAL FOREIGN BODY EXTREMITY LEFT FOOT;  Surgeon: Trula Slade, DPM;  Location: [facility];  Service: Podiatry;  Laterality: Left;  . PILONIDAL CYST EXCISION  1990's  . TENDON REPAIR Left    Left Ankle  . WISDOM TOOTH EXTRACTION  19090's    SOCIAL HISTORY: Social History  Substance Use Topics  . Smoking status: Former Smoker    Years: 10.00    Types: Cigarettes    Quit date: 2003  . Smokeless tobacco: Never Used  . Alcohol use 0.0 oz/week     Comment: rarely - maybe 1 drink per month     FAMILY HISTORY: Family History  Problem Relation Age of Onset  . Cancer Mother     squamous cell carcinoma  . Hyperlipidemia Mother   . Depression Mother   . Anxiety disorder Mother   . Obesity Mother   . Heart disease Father     cardiomegaly, CHF  . Hyperlipidemia Father   . Hypertension Father   . Mental retardation Father   . High blood pressure Father   . Depression Father   . Anxiety disorder Father   . Obesity Father   . Cancer Maternal Grandmother   . Diabetes Maternal Grandmother   . Heart disease  Maternal Grandmother   . Hyperlipidemia Maternal Grandmother   . Hypertension Maternal Grandmother   . Stroke Maternal Grandmother   . Heart disease Paternal Grandmother   . Hypertension Paternal Grandmother   . Heart disease Paternal Grandfather   . Hyperlipidemia Paternal Grandfather   . Mental illness Paternal Grandfather   . Breast cancer Maternal Aunt   . Thyroid cancer Paternal Aunt  ROS: Review of Systems  Constitutional: Negative for weight loss.  Gastrointestinal: Negative for nausea.  Genitourinary: Negative for frequency.  Endo/Heme/Allergies: Negative for polydipsia.       Negative hypoglycemia    PHYSICAL EXAM: Blood pressure 112/72, pulse 79, temperature 98 F (36.7 C), height 5\' 5"  (1.651 m), weight 263 lb (119.3 kg), SpO2 98 %. Body mass index is 43.77 kg/m. Physical Exam  Constitutional: She is oriented to person, place, and time. She appears well-developed and well-nourished.  Cardiovascular: Normal rate.   Pulmonary/Chest: Effort normal.  Musculoskeletal: Normal range of motion.  Neurological: She is oriented to person, place, and time.  Skin: Skin is warm and dry.  Psychiatric: She has a normal mood and affect. Her behavior is normal.  Vitals reviewed.   RECENT LABS AND TESTS: BMET    Component Value Date/Time   NA 139 11/12/2016 1651   K 3.9 11/12/2016 1651   CL 105 11/12/2016 1651   CO2 23 11/12/2016 1651   GLUCOSE 85 11/12/2016 1651   GLUCOSE 87 06/27/2016 0933   BUN 13 11/12/2016 1651   CREATININE 0.94 11/12/2016 1651   CREATININE 0.95 06/27/2016 0933   CALCIUM 8.3 (L) 11/12/2016 1651   GFRNONAA 76 11/12/2016 1651   GFRAA 87 11/12/2016 1651   Lab Results  Component Value Date   HGBA1C 5.7 (H) 08/31/2016   HGBA1C 5.6 12/01/2015   Lab Results  Component Value Date   INSULIN 17.4 08/31/2016   CBC    Component Value Date/Time   WBC 7.9 11/12/2016 1651   WBC 8.1 06/27/2016 0941   WBC 9.9 04/30/2016 0947   RBC 4.36 11/12/2016  1651   RBC 3.96 (A) 06/27/2016 0941   RBC 4.15 04/30/2016 0947   HGB 11.5 (A) 06/27/2016 0941   HGB 11.7 04/30/2016 0947   HCT 36.5 11/12/2016 1651   PLT 332 11/12/2016 1651   MCV 84 11/12/2016 1651   MCH 27.3 11/12/2016 1651   MCH 29.0 06/27/2016 0941   MCH 28.2 04/30/2016 0947   MCHC 32.6 11/12/2016 1651   MCHC 34.5 06/27/2016 0941   MCHC 33.2 04/30/2016 0947   RDW 15.5 (H) 11/12/2016 1651   LYMPHSABS 2.2 11/12/2016 1651   MONOABS 891 04/30/2016 0947   EOSABS 0.1 11/12/2016 1651   BASOSABS 0.0 11/12/2016 1651   Iron/TIBC/Ferritin/ %Sat    Component Value Date/Time   IRON 53 08/31/2016 1644   TIBC 280 08/31/2016 1644   FERRITIN 36 08/31/2016 1644   IRONPCTSAT 19 08/31/2016 1644   Lipid Panel     Component Value Date/Time   CHOL 148 08/31/2016 1644   TRIG 161 (H) 08/31/2016 1644   HDL 44 08/31/2016 1644   CHOLHDL 4.1 12/01/2015 0914   VLDL 37 (H) 12/01/2015 0914   LDLCALC 72 08/31/2016 1644   Hepatic Function Panel     Component Value Date/Time   PROT 7.4 08/31/2016 1644   ALBUMIN 4.0 08/31/2016 1644   AST 24 08/31/2016 1644   ALT 18 08/31/2016 1644   ALKPHOS 90 08/31/2016 1644   BILITOT 0.2 08/31/2016 1644      Component Value Date/Time   TSH 1.620 08/31/2016 1644   TSH 1.83 12/01/2015 0914    ASSESSMENT AND PLAN: Prediabetes - Plan: metFORMIN (GLUCOPHAGE) 500 MG tablet  At risk for diabetes mellitus  Morbid obesity ([place])  PLAN:  Pre-Diabetes Ande will continue to work on weight loss, exercise, and decreasing simple carbohydrates in her diet to help decrease the risk of diabetes. We discussed how  to get her back on her diet plan. We dicussed metformin including benefits and risks. She was informed that eating too many simple carbohydrates or too many calories at one sitting increases the likelihood of GI side effects. Marcee agrees to continue to take metformin for now and a prescription was written today for 1 month refill. We will plan to  re-check labs in 3 months and Dula agreed to follow up with [name] as directed to monitor her progress.  Diabetes risk counselling Charmain was given extended (at least 15 minutes) diabetes prevention counseling today. She is 41 y.o. female and has risk factors for diabetes including obesity and pre-diabetes. We discussed intensive lifestyle modifications today with an emphasis on weight loss as well as increasing exercise and decreasing simple carbohydrates in her diet.  Obesity Carline is currently in the action stage of change. As such, her goal is to continue with weight loss efforts She has agreed to change to follow the Category 3 plan Averie has been instructed to work up to a goal of 150 minutes of combined cardio and strengthening exercise per week for weight loss and overall health benefits. We discussed the following Behavioral Modification Stratagies today: increasing lean protein intake, decreasing simple carbohydrates , decreasing sodium intake, decrease eating out and decrease junk food  Doriana has agreed to follow up with our clinic in 3 weeks. She was informed of the importance of frequent follow up visits to maximize her success with intensive lifestyle modifications for her multiple health conditions.  I, Doreene Nest, am acting as scribe for Dennard Nip, MD  I have reviewed the above documentation for accuracy and completeness, and I agree with the above. -Dennard Nip, MD

## 2017-01-02 DIAGNOSIS — F411 Generalized anxiety disorder: Secondary | ICD-10-CM | POA: Diagnosis not present

## 2017-01-04 MED FILL — PROPRANOLOL 40 MG TABLET: 40 | 30 days supply | Qty: 60 | Fill #5

## 2017-01-04 MED FILL — L-METHYLFOLATE CALCIUM 15 M: 15 | 30 days supply | Qty: 30 | Fill #2

## 2017-01-04 MED FILL — QUETIAPINE FUMARATE 400 MG: 400 | 30 days supply | Qty: 45 | Fill #11

## 2017-01-04 MED FILL — lamoTRIgine 200 MG TABS: 200 | 30 days supply | Qty: 30 | Fill #12

## 2017-01-04 MED FILL — metFORMIN HCL 500 MG TABS: 500 | 30 days supply | Qty: 60 | Fill #0

## 2017-01-04 MED FILL — DYMISTA NASAL SPRAY: 137-50 | 30 days supply | Qty: 23 | Fill #3

## 2017-01-05 MED FILL — clonazePAM 1 MG TABS: 1 | 30 days supply | Qty: 60 | Fill #1

## 2017-01-11 ENCOUNTER — Ambulatory Visit (INDEPENDENT_AMBULATORY_CARE_PROVIDER_SITE_OTHER): Payer: 59 | Admitting: Family Medicine

## 2017-01-11 VITALS — BP 121/80 | HR 78 | Temp 98.3°F | Ht 65.0 in | Wt 260.0 lb

## 2017-01-11 DIAGNOSIS — Z9189 Other specified personal risk factors, not elsewhere classified: Secondary | ICD-10-CM

## 2017-01-11 DIAGNOSIS — R7303 Prediabetes: Secondary | ICD-10-CM | POA: Diagnosis not present

## 2017-01-11 DIAGNOSIS — E784 Other hyperlipidemia: Secondary | ICD-10-CM

## 2017-01-11 DIAGNOSIS — E559 Vitamin D deficiency, unspecified: Secondary | ICD-10-CM | POA: Diagnosis not present

## 2017-01-11 DIAGNOSIS — E7849 Other hyperlipidemia: Secondary | ICD-10-CM

## 2017-01-11 MED ORDER — LIRAGLUTIDE -WEIGHT MANAGEMENT 18 MG/3ML ~~LOC~~ SOPN: 3.0000 mg | PEN_INJECTOR | Freq: Every day | SUBCUTANEOUS | 0 refills | Status: DC

## 2017-01-11 NOTE — Progress Notes (Signed)
Office: [phone]  /  Fax: [phone]   HPI:   Chief Complaint: OBESITY Caroline Gonzales is here to discuss her progress with her obesity treatment plan. She is on  the Category 3 plan, not doing journaling and is following her eating plan approximately 70 % of the time. She states she is exercising with trainer (weights and cardio) 30 minutes 1 times per week. Caroline Gonzales continues to do well with weight loss bus is still struggling with emotional eating and notes she is hungry at least 60 to 90 minutes before every meal. Her weight is 260 lb (117.9 kg) today and has had a weight loss of 3 pounds over a period of 2 weeks since her last visit. She has lost 18 lbs since starting treatment with [name].  Vitamin D deficiency Caroline Gonzales has a diagnosis of vitamin D deficiency. She is currently taking OTC vit D, last level at goal. Fatigue is improving and she denies nausea, vomiting or muscle weakness.  Pre-Diabetes Caroline Gonzales has a diagnosis of prediabetes based on her elevated Hgb A1c and was informed this puts her at greater risk of developing diabetes. She is taking metformin currently and still notes polyphagia. She continues to work on diet and exercise to decrease risk of diabetes. She denies nausea, vomiting or hypoglycemia.  At risk for diabetes Caroline Gonzales is at higher than average risk for developing diabetes due to her obesity and pre-diabetes. She currently denies polyuria or polydipsia.  Hyperlipidemia Caroline Gonzales has hyperlipidemia and has been trying to improve her cholesterol levels with intensive lifestyle modification including a low saturated fat diet, exercise and weight loss. She denies any chest pain, claudication or myalgias. She is due for labs.   Wt Readings from Last 500 Encounters:  01/11/17 260 lb (117.9 kg)  12/24/16 263 lb (119.3 kg)  12/09/16 260 lb (117.9 kg)  11/18/16 260 lb (117.9 kg)  11/17/16 260 lb (117.9 kg)  11/12/16 264 lb 6.4 oz (119.9 kg)  11/02/16 261 lb (118.4 kg)    10/15/16 263 lb (119.3 kg)  09/29/16 268 lb (121.6 kg)  09/16/16 271 lb (122.9 kg)  08/31/16 278 lb (126.1 kg)  06/27/16 278 lb (126.1 kg)  06/19/16 282 lb (127.9 kg)  05/13/16 275 lb 6.4 oz (124.9 kg)  04/30/16 279 lb (126.6 kg)  04/29/16 278 lb (126.1 kg)  12/12/15 269 lb (122 kg)  12/01/15 270 lb 6.4 oz (122.7 kg)     ALLERGIES: Allergies  Allergen Reactions  . Tamiflu [Oseltamivir Phosphate] Anaphylaxis  . Cephalosporins Rash  . Latex Rash  . Penicillins Rash    MEDICATIONS: Current Outpatient Prescriptions on File Prior to Visit  Medication Sig Dispense Refill  . benzonatate (TESSALON PERLES) 100 MG capsule Take 1 capsule (100 mg total) by mouth 3 (three) times daily as needed. 20 capsule 0  . cetirizine (ZYRTEC) 10 MG tablet Take 10 mg by mouth at bedtime.    . Cholecalciferol (VITAMIN D3) 5000 units CAPS Take 5,000 Units by mouth at bedtime.    . clindamycin (CLEOCIN) 300 MG capsule Take 1 capsule (300 mg total) by mouth 3 (three) times daily. 21 capsule 0  . clonazePAM (KLONOPIN) 1 MG tablet Take 1 mg by mouth 2 (two) times daily as needed for anxiety.    [name] ibuprofen (ADVIL,MOTRIN) 200 MG tablet Take 400-600 mg by mouth daily as needed for headache or moderate pain.    . L-methylfolate Calcium 15 MG TABS Take 15 m by mouth daily.    [name] lamoTRIgine (LAMICTAL)  200 MG tablet Take 200 mg by mouth at bedtime.    [name] levonorgestrel (MIRENA) 20 MCG/24HR IUD 1 each by Intrauterine route once.    . loratadine (CLARITIN) 10 MG tablet Take 10 mg by mouth at bedtime.     . metFORMIN (GLUCOPHAGE) 500 MG tablet Take 1 tablet (500 mg total) by mouth 2 (two) times daily with a meal. 60 tablet 0  . Multiple Vitamin (MULTIVITAMIN) tablet Take 1 tablet by mouth Nightly.    [name] oxyCODONE-acetaminophen (ROXICET) 5-325 MG tablet Take 1-2 tablets by mouth every 6 (six) hours as needed for severe pain. 30 tablet 0  . predniSONE (STERAPRED UNI-PAK 21 TAB) 10 MG (21) TBPK tablet Use as directed 21  tablet 0  . promethazine (PHENERGAN) 25 MG tablet Take 1 tablet (25 mg total) by mouth every 8 (eight) hours as needed for nausea or vomiting. 20 tablet 0  . propranolol (INDERAL) 40 MG tablet Take 40 mg by mouth 2 (two) times daily as needed (anxiety).     . QUEtiapine (SEROQUEL) 400 MG tablet Take 600 mg by mouth at bedtime.     . ranitidine (ZANTAC) 150 MG tablet Take 150 mg by mouth at bedtime.    . triamcinolone ointment (KENALOG) 0.1 % Apply 1 application topically daily as needed (rash).   3   No current facility-administered medications on file prior to visit.     PAST MEDICAL HISTORY: Past Medical History:  Diagnosis Date  . Allergy    Zyrtec, Allegra.  . Anxiety    followed by Dr. Toy Cookey  . Back pain   . Bipolar 1 disorder ([place])   . Complication of anesthesia   . Depression   . Fibroid   . Gallbladder problem   . Gastroparesis 09/14/2012   gastric emptying study in 2014  . GERD (gastroesophageal reflux disease)   . Pneumonia    2013ish  . PONV (postoperative nausea and vomiting)   . Pre-diabetes   . PTSD (post-traumatic stress disorder)   . Vitamin D deficiency     PAST SURGICAL HISTORY: Past Surgical History:  Procedure Laterality Date  . CESAREAN SECTION    . CHOLECYSTECTOMY    . DILATION AND CURETTAGE OF UTERUS    . FOREIGN BODY REMOVAL Left 11/18/2016   Procedure: REMOVAL FOREIGN BODY EXTREMITY LEFT FOOT;  Surgeon: Trula Slade, DPM;  Location: [facility];  Service: Podiatry;  Laterality: Left;  . PILONIDAL CYST EXCISION  1990's  . TENDON REPAIR Left    Left Ankle  . WISDOM TOOTH EXTRACTION  19090's    SOCIAL HISTORY: Social History  Substance Use Topics  . Smoking status: Former Smoker    Years: 10.00    Types: Cigarettes    Quit date: 2003  . Smokeless tobacco: Never Used  . Alcohol use 0.0 oz/week     Comment: rarely - maybe 1 drink per month     FAMILY HISTORY: Family History  Problem Relation Age of Onset  . Cancer Mother     squamous  cell carcinoma  . Hyperlipidemia Mother   . Depression Mother   . Anxiety disorder Mother   . Obesity Mother   . Heart disease Father     cardiomegaly, CHF  . Hyperlipidemia Father   . Hypertension Father   . Mental retardation Father   . High blood pressure Father   . Depression Father   . Anxiety disorder Father   . Obesity Father   . Cancer Maternal Grandmother   .  Diabetes Maternal Grandmother   . Heart disease Maternal Grandmother   . Hyperlipidemia Maternal Grandmother   . Hypertension Maternal Grandmother   . Stroke Maternal Grandmother   . Heart disease Paternal Grandmother   . Hypertension Paternal Grandmother   . Heart disease Paternal Grandfather   . Hyperlipidemia Paternal Grandfather   . Mental illness Paternal Grandfather   . Breast cancer Maternal Aunt   . Thyroid cancer Paternal Aunt     ROS: Review of Systems  Constitutional: Positive for malaise/fatigue and weight loss.  Cardiovascular: Negative for chest pain and claudication.  Gastrointestinal: Negative for nausea and vomiting.  Genitourinary: Negative for frequency.  Musculoskeletal: Negative for myalgias.       Negative muscle weakness  Endo/Heme/Allergies: Negative for polydipsia.       Polyphagia Negative hypoglycemia    PHYSICAL EXAM: Blood pressure 121/80, pulse 78, temperature 98.3 F (36.8 C), height 5\' 5"  (1.651 m), weight 260 lb (117.9 kg), SpO2 98 %. Body mass index is 43.27 kg/m. Physical Exam  Constitutional: She is oriented to person, place, and time. She appears well-developed and well-nourished.  Cardiovascular: Normal rate.   Pulmonary/Chest: Effort normal.  Musculoskeletal: Normal range of motion.  Neurological: She is oriented to person, place, and time.  Skin: Skin is warm and dry.  Psychiatric: She has a normal mood and affect. Her behavior is normal.  Vitals reviewed.   RECENT LABS AND TESTS: BMET    Component Value Date/Time   NA 139 11/12/2016 1651   K 3.9  11/12/2016 1651   CL 105 11/12/2016 1651   CO2 23 11/12/2016 1651   GLUCOSE 85 11/12/2016 1651   GLUCOSE 87 06/27/2016 0933   BUN 13 11/12/2016 1651   CREATININE 0.94 11/12/2016 1651   CREATININE 0.95 06/27/2016 0933   CALCIUM 8.3 (L) 11/12/2016 1651   GFRNONAA 76 11/12/2016 1651   GFRAA 87 11/12/2016 1651   Lab Results  Component Value Date   HGBA1C 5.7 (H) 08/31/2016   HGBA1C 5.6 12/01/2015   Lab Results  Component Value Date   INSULIN 17.4 08/31/2016   CBC    Component Value Date/Time   WBC 7.9 11/12/2016 1651   WBC 8.1 06/27/2016 0941   WBC 9.9 04/30/2016 0947   RBC 4.36 11/12/2016 1651   RBC 3.96 (A) 06/27/2016 0941   RBC 4.15 04/30/2016 0947   HGB 11.5 (A) 06/27/2016 0941   HGB 11.7 04/30/2016 0947   HCT 36.5 11/12/2016 1651   PLT 332 11/12/2016 1651   MCV 84 11/12/2016 1651   MCH 27.3 11/12/2016 1651   MCH 29.0 06/27/2016 0941   MCH 28.2 04/30/2016 0947   MCHC 32.6 11/12/2016 1651   MCHC 34.5 06/27/2016 0941   MCHC 33.2 04/30/2016 0947   RDW 15.5 (H) 11/12/2016 1651   LYMPHSABS 2.2 11/12/2016 1651   MONOABS 891 04/30/2016 0947   EOSABS 0.1 11/12/2016 1651   BASOSABS 0.0 11/12/2016 1651   Iron/TIBC/Ferritin/ %Sat    Component Value Date/Time   IRON 53 08/31/2016 1644   TIBC 280 08/31/2016 1644   FERRITIN 36 08/31/2016 1644   IRONPCTSAT 19 08/31/2016 1644   Lipid Panel     Component Value Date/Time   CHOL 148 08/31/2016 1644   TRIG 161 (H) 08/31/2016 1644   HDL 44 08/31/2016 1644   CHOLHDL 4.1 12/01/2015 0914   VLDL 37 (H) 12/01/2015 0914   LDLCALC 72 08/31/2016 1644   Hepatic Function Panel     Component Value Date/Time   PROT 7.4  08/31/2016 1644   ALBUMIN 4.0 08/31/2016 1644   AST 24 08/31/2016 1644   ALT 18 08/31/2016 1644   ALKPHOS 90 08/31/2016 1644   BILITOT 0.2 08/31/2016 1644      Component Value Date/Time   TSH 1.620 08/31/2016 1644   TSH 1.83 12/01/2015 0914    ASSESSMENT AND PLAN: Prediabetes - Plan: Comprehensive  metabolic panel, Hemoglobin A1c, Insulin, random  Other hyperlipidemia - Plan: Lipid Panel With LDL/HDL Ratio  Vitamin D deficiency - Plan: VITAMIN D 25 Hydroxy (Vit-D Deficiency, Fractures)  At risk for diabetes mellitus  Morbid obesity ([place]) - Plan: Liraglutide -Weight Management (SAXENDA) 18 MG/3ML SOPN  PLAN:  Vitamin D Deficiency Caroline Gonzales was informed that low vitamin D levels contributes to fatigue and are associated with obesity, breast, and colon cancer. She agrees to continue to take OTC vitamin D, we will check labs and will follow up for routine testing of vitamin D, at least 2-3 times per year. She was informed of the risk of over-replacement of vitamin D and agrees to not increase her dose unless he discusses this with [name] first. Caroline Gonzales agrees to follow up with our clinic in 2 weeks.  Pre-Diabetes Caroline Gonzales will continue to work on weight loss, exercise, and decreasing simple carbohydrates in her diet to help decrease the risk of diabetes. We dicussed metformin including benefits and risks. She was informed that eating too many simple carbohydrates or too many calories at one sitting increases the likelihood of GI side effects. Caroline Gonzales agreed to continue to take metformin and we will check labs and Caroline Gonzales agreed to follow up with [name] as directed to monitor her progress.  Diabetes risk counselling Caroline Gonzales was given extended (at least 15 minutes) diabetes prevention counseling today. She is 41 y.o. female and has risk factors for diabetes including obesity and pre-diabetes. We discussed intensive lifestyle modifications today with an emphasis on weight loss as well as increasing exercise and decreasing simple carbohydrates in her diet.  Hyperlipidemia Caroline Gonzales was informed of the American Heart Association Guidelines emphasizing intensive lifestyle modifications as the first line treatment for hyperlipidemia. We discussed many lifestyle modifications today in depth, and Caroline Gonzales will  continue to work on decreasing saturated fats such as fatty red meat, butter and many fried foods. She will also increase vegetables and lean protein in her diet and continue to work on exercise and weight loss efforts.  Obesity Caroline Gonzales is currently in the action stage of change. As such, her goal is to continue with weight loss efforts She has agreed to follow the Category 3 plan Caroline Gonzales has been instructed to work up to a goal of 150 minutes of combined cardio and strengthening exercise per week for weight loss and overall health benefits. We discussed the following Behavioral Modification Stratagies today: increasing lean protein intake, increasing vegetables and avoiding temptations   We discussed various medication options to help Caroline Gonzales with her weight loss efforts, she has been on phentermine approximately 10 years ago, not effective and we both agreed to start Saxenda 3.0 mg qd #1 month with no refills and will follow up with our clinic in 2 weeks.  Caroline Gonzales has agreed to follow up with our clinic in 2 weeks. She was informed of the importance of frequent follow up visits to maximize her success with intensive lifestyle modifications for her multiple health conditions.  I, Doreene Nest, am acting as scribe for Dennard Nip, MD  I have reviewed the above documentation for accuracy and completeness, and I agree  with the above. -Dennard Nip, MD

## 2017-01-12 LAB — INSULIN, RANDOM: INSULIN: 17 u[IU]/mL (ref 2.6–24.9)

## 2017-01-12 LAB — HEMOGLOBIN A1C
Est. average glucose Bld gHb Est-mCnc: 105 mg/dL
Hgb A1c MFr Bld: 5.3 % (ref 4.8–5.6)

## 2017-01-12 LAB — COMPREHENSIVE METABOLIC PANEL
ALT: 15 IU/L (ref 0–32)
AST: 17 IU/L (ref 0–40)
Albumin/Globulin Ratio: 1.2 (ref 1.2–2.2)
Albumin: 3.8 g/dL (ref 3.5–5.5)
Alkaline Phosphatase: 99 IU/L (ref 39–117)
BUN/Creatinine Ratio: 15 (ref 9–23)
BUN: 13 mg/dL (ref 6–24)
Bilirubin Total: <0.2 mg/dL (ref 0.0–1.2)
CO2: 24 mmol/L (ref 18–29)
Calcium: 9.1 mg/dL (ref 8.7–10.2)
Chloride: 99 mmol/L (ref 96–106)
Creatinine, Ser: 0.84 mg/dL (ref 0.57–1.00)
GFR calc Af Amer: 100 mL/min/{1.73_m2} (ref >59)
GFR calc non Af Amer: 87 mL/min/{1.73_m2} (ref >59)
Globulin, Total: 3.3 g/dL (ref 1.5–4.5)
Glucose: 82 mg/dL (ref 65–99)
Potassium: 4.3 mmol/L (ref 3.5–5.2)
Sodium: 138 mmol/L (ref 134–144)
Total Protein: 7.1 g/dL (ref 6.0–8.5)

## 2017-01-12 LAB — LIPID PANEL WITH LDL/HDL RATIO
Cholesterol, Total: 148 mg/dL (ref 100–199)
HDL: 39 mg/dL — ABNORMAL LOW (ref >39)
LDL Calculated: 82 mg/dL (ref 0–99)
LDl/HDL Ratio: 2.1 ratio (ref 0.0–3.2)
Triglycerides: 133 mg/dL (ref 0–149)
VLDL Cholesterol Cal: 27 mg/dL (ref 5–40)

## 2017-01-12 LAB — VITAMIN D 25 HYDROXY (VIT D DEFICIENCY, FRACTURES): Vit D, 25-Hydroxy: 60.5 ng/mL (ref 30.0–100.0)

## 2017-01-15 ENCOUNTER — Encounter (INDEPENDENT_AMBULATORY_CARE_PROVIDER_SITE_OTHER): Payer: Self-pay | Admitting: Family Medicine

## 2017-01-16 DIAGNOSIS — F411 Generalized anxiety disorder: Secondary | ICD-10-CM | POA: Diagnosis not present

## 2017-01-19 MED FILL — SAXENDA 18 MG/3 ML PEN: 18 | 30 days supply | Qty: 15 | Fill #0

## 2017-01-20 ENCOUNTER — Encounter (INDEPENDENT_AMBULATORY_CARE_PROVIDER_SITE_OTHER): Payer: Self-pay

## 2017-01-21 ENCOUNTER — Other Ambulatory Visit (INDEPENDENT_AMBULATORY_CARE_PROVIDER_SITE_OTHER): Payer: Self-pay

## 2017-01-21 MED ORDER — INSULIN PEN NEEDLE 31G X 6 MM MISC: 1.0000 | Freq: Every morning | 0 refills | Status: DC

## 2017-01-21 MED FILL — UNIFINE PENTIPS 31GX3/16": 31G X 5 MM | 90 days supply | Qty: 100 | Fill #0

## 2017-01-21 MED FILL — UNIFINE PENTIPS 31GX3/16: 31G X 5 MM | 90 days supply | Qty: 100 | Fill #0

## 2017-01-25 ENCOUNTER — Ambulatory Visit (INDEPENDENT_AMBULATORY_CARE_PROVIDER_SITE_OTHER): Payer: 59 | Admitting: Family Medicine

## 2017-01-25 VITALS — HR 91 | Temp 97.8°F | Ht 65.0 in | Wt 257.0 lb

## 2017-01-25 DIAGNOSIS — R7303 Prediabetes: Secondary | ICD-10-CM

## 2017-01-25 DIAGNOSIS — Z9189 Other specified personal risk factors, not elsewhere classified: Secondary | ICD-10-CM | POA: Diagnosis not present

## 2017-01-25 NOTE — Progress Notes (Signed)
Office: [phone]  /  Fax: [phone]   HPI:   Chief Complaint: OBESITY Caroline Gonzales is here to discuss her progress with her obesity treatment plan. She is on the  follow the Category 3 plan and is following her eating plan approximately 70 % of the time. She states she is exercising 0 minutes 0 times per week. Caroline Gonzales is doing well with weight loss. She started [name] 4 days ago and noted nausea on the first day but this is improving. She still has a very low level of nausea but not enough to discontinue medications. Her weight is 257 lb (116.6 kg) today and has had a weight loss of 3 pounds over a period of 2 weeks since her last visit. She has lost 21 lbs since starting treatment with [name].  Pre-Diabetes Caroline Gonzales has a diagnosis of pre-diabetes based on her elevated Hgb A1c and was informed this puts her at greater risk of developing diabetes. She was on Metformin and repeat labs discussed today is greatly improved. She is doing well on diet but is now on Saxenda.  She continues to work on diet and exercise to decrease risk of diabetes. She denies hypoglycemia.  At risk for diabetes Caroline Gonzales is at higher than averagerisk for developing diabetes due to her obesity. She currently denies polyuria or polydipsia.   ALLERGIES: Allergies  Allergen Reactions  . Tamiflu [Oseltamivir Phosphate] Anaphylaxis  . Cephalosporins Rash  . Latex Rash  . Penicillins Rash    MEDICATIONS: Current Outpatient Prescriptions on File Prior to Visit  Medication Sig Dispense Refill  . cetirizine (ZYRTEC) 10 MG tablet Take 10 mg by mouth at bedtime.    . Cholecalciferol (VITAMIN D3) 5000 units CAPS Take 5,000 Units by mouth at bedtime.    . clonazePAM (KLONOPIN) 1 MG tablet Take 1 mg by mouth 2 (two) times daily as needed for anxiety.    . Insulin Pen Needle 31G X 6 MM MISC 1 Package by Does not apply route every morning. 30 each 0  . L-methylfolate Calcium 15 MG TABS Take 15 m by mouth daily.    [name]  lamoTRIgine (LAMICTAL) 200 MG tablet Take 200 mg by mouth at bedtime.    [name] levonorgestrel (MIRENA) 20 MCG/24HR IUD 1 each by Intrauterine route once.    . Liraglutide -Weight Management (SAXENDA) 18 MG/3ML SOPN Inject 3 mg into the skin daily. 3 pen 0  . loratadine (CLARITIN) 10 MG tablet Take 10 mg by mouth at bedtime.     . Multiple Vitamin (MULTIVITAMIN) tablet Take 1 tablet by mouth Nightly.    . promethazine (PHENERGAN) 25 MG tablet Take 1 tablet (25 mg total) by mouth every 8 (eight) hours as needed for nausea or vomiting. 20 tablet 0  . propranolol (INDERAL) 40 MG tablet Take 40 mg by mouth 2 (two) times daily as needed (anxiety).     . QUEtiapine (SEROQUEL) 400 MG tablet Take 600 mg by mouth at bedtime.     . ranitidine (ZANTAC) 150 MG tablet Take 150 mg by mouth at bedtime.    . triamcinolone ointment (KENALOG) 0.1 % Apply 1 application topically daily as needed (rash).   3  . ibuprofen (ADVIL,MOTRIN) 200 MG tablet Take 400-600 mg by mouth daily as needed for headache or moderate pain.     No current facility-administered medications on file prior to visit.     PAST MEDICAL HISTORY: Past Medical History:  Diagnosis Date  . Allergy    Zyrtec, Allegra.  [name]  Anxiety    followed by Dr. Toy Cookey  . Back pain   . Bipolar 1 disorder ([place])   . Complication of anesthesia   . Depression   . Fibroid   . Gallbladder problem   . Gastroparesis 09/14/2012   gastric emptying study in 2014  . GERD (gastroesophageal reflux disease)   . Pneumonia    2013ish  . PONV (postoperative nausea and vomiting)   . Pre-diabetes   . PTSD (post-traumatic stress disorder)   . Vitamin D deficiency     PAST SURGICAL HISTORY: Past Surgical History:  Procedure Laterality Date  . CESAREAN SECTION    . CHOLECYSTECTOMY    . DILATION AND CURETTAGE OF UTERUS    . FOREIGN BODY REMOVAL Left 11/18/2016   Procedure: REMOVAL FOREIGN BODY EXTREMITY LEFT FOOT;  Surgeon: Trula Slade, DPM;  Location: [facility];   Service: Podiatry;  Laterality: Left;  . PILONIDAL CYST EXCISION  1990's  . TENDON REPAIR Left    Left Ankle  . WISDOM TOOTH EXTRACTION  19090's    SOCIAL HISTORY: Social History  Substance Use Topics  . Smoking status: Former Smoker    Years: 10.00    Types: Cigarettes    Quit date: 2003  . Smokeless tobacco: Never Used  . Alcohol use 0.0 oz/week     Comment: rarely - maybe 1 drink per month     FAMILY HISTORY: Family History  Problem Relation Age of Onset  . Cancer Mother        squamous cell carcinoma  . Hyperlipidemia Mother   . Depression Mother   . Anxiety disorder Mother   . Obesity Mother   . Heart disease Father        cardiomegaly, CHF  . Hyperlipidemia Father   . Hypertension Father   . Mental retardation Father   . High blood pressure Father   . Depression Father   . Anxiety disorder Father   . Obesity Father   . Cancer Maternal Grandmother   . Diabetes Maternal Grandmother   . Heart disease Maternal Grandmother   . Hyperlipidemia Maternal Grandmother   . Hypertension Maternal Grandmother   . Stroke Maternal Grandmother   . Heart disease Paternal Grandmother   . Hypertension Paternal Grandmother   . Heart disease Paternal Grandfather   . Hyperlipidemia Paternal Grandfather   . Mental illness Paternal Grandfather   . Breast cancer Maternal Aunt   . Thyroid cancer Paternal Aunt     ROS: Review of Systems  Constitutional: Positive for weight loss.  Gastrointestinal: Positive for nausea (low level).  Endo/Heme/Allergies:       Negative hypoglycemia    PHYSICAL EXAM: Pulse 91, temperature 97.8 F (36.6 C), height 5\' 5"  (1.651 m), weight 257 lb (116.6 kg), SpO2 99 %. Body mass index is 42.77 kg/m. Physical Exam  Constitutional: She is oriented to person, place, and time. She appears well-developed and well-nourished.  Cardiovascular: Normal rate.   Pulmonary/Chest: Effort normal.  Musculoskeletal: Normal range of motion.  Neurological: She  is oriented to person, place, and time.  Skin: Skin is warm and dry.  Psychiatric: She has a normal mood and affect. Her behavior is normal.  Vitals reviewed.   RECENT LABS AND TESTS: BMET    Component Value Date/Time   NA 138 01/11/2017 1313   K 4.3 01/11/2017 1313   CL 99 01/11/2017 1313   CO2 24 01/11/2017 1313   GLUCOSE 82 01/11/2017 1313   GLUCOSE 87 06/27/2016 0933  BUN 13 01/11/2017 1313   CREATININE 0.84 01/11/2017 1313   CREATININE 0.95 06/27/2016 0933   CALCIUM 9.1 01/11/2017 1313   GFRNONAA 87 01/11/2017 1313   GFRAA 100 01/11/2017 1313   Lab Results  Component Value Date   HGBA1C 5.3 01/11/2017   HGBA1C 5.7 (H) 08/31/2016   HGBA1C 5.6 12/01/2015   Lab Results  Component Value Date   INSULIN 17.0 01/11/2017   INSULIN 17.4 08/31/2016   CBC    Component Value Date/Time   WBC 7.9 11/12/2016 1651   WBC 8.1 06/27/2016 0941   WBC 9.9 04/30/2016 0947   RBC 4.36 11/12/2016 1651   RBC 3.96 (A) 06/27/2016 0941   RBC 4.15 04/30/2016 0947   HGB 11.5 (A) 06/27/2016 0941   HGB 11.7 04/30/2016 0947   HCT 36.5 11/12/2016 1651   PLT 332 11/12/2016 1651   MCV 84 11/12/2016 1651   MCH 27.3 11/12/2016 1651   MCH 29.0 06/27/2016 0941   MCH 28.2 04/30/2016 0947   MCHC 32.6 11/12/2016 1651   MCHC 34.5 06/27/2016 0941   MCHC 33.2 04/30/2016 0947   RDW 15.5 (H) 11/12/2016 1651   LYMPHSABS 2.2 11/12/2016 1651   MONOABS 891 04/30/2016 0947   EOSABS 0.1 11/12/2016 1651   BASOSABS 0.0 11/12/2016 1651   Iron/TIBC/Ferritin/ %Sat    Component Value Date/Time   IRON 53 08/31/2016 1644   TIBC 280 08/31/2016 1644   FERRITIN 36 08/31/2016 1644   IRONPCTSAT 19 08/31/2016 1644   Lipid Panel     Component Value Date/Time   CHOL 148 01/11/2017 1313   TRIG 133 01/11/2017 1313   HDL 39 (L) 01/11/2017 1313   CHOLHDL 4.1 12/01/2015 0914   VLDL 37 (H) 12/01/2015 0914   LDLCALC 82 01/11/2017 1313   Hepatic Function Panel     Component Value Date/Time   PROT 7.1  01/11/2017 1313   ALBUMIN 3.8 01/11/2017 1313   AST 17 01/11/2017 1313   ALT 15 01/11/2017 1313   ALKPHOS 99 01/11/2017 1313   BILITOT <0.2 01/11/2017 1313      Component Value Date/Time   TSH 1.620 08/31/2016 1644   TSH 1.83 12/01/2015 0914    ASSESSMENT AND PLAN: Prediabetes  Morbid obesity ([place])  PLAN:  Pre-Diabetes Caroline Gonzales will continue to work on weight loss, exercise, and decreasing simple carbohydrates in her diet to help decrease the risk of diabetes.   She was informed that eating too many simple carbohydrates or too many calories at one sitting increases the likelihood of GI side effects. Caroline Gonzales agrees to discontinue metformin for now and a prescription was not written today. Caroline Gonzales agreed to follow up with [name] as directed to monitor her progress.  Diabetes risk counselling Caroline Gonzales was given extended (at least 15 minutes) diabetes prevention counseling today. She is 41 y.o. female and has risk factors for diabetes including obesity. We discussed intensive lifestyle modifications today with an emphasis on weight loss as well as increasing exercise and decreasing simple carbohydrates in her diet.   Obesity Caroline Gonzales is currently in the action stage of change. As such, her goal is to continue with weight loss efforts She has agreed to change to keep a food journal with 1300 to 1500 calories and 80 grams of protein daily Caroline Gonzales has been instructed to work up to a goal of 150 minutes of combined cardio and strengthening exercise per week for weight loss and overall health benefits. We discussed the following Behavioral Modification Strategies today: increasing lean protein intake and  decreasing simple carbohydrates   Caroline Gonzales has agreed to follow up with our clinic in 2 weeks. She was informed of the importance of frequent follow up visits to maximize her success with intensive lifestyle modifications for her multiple health conditions.  I, Doreene Nest, am acting as scribe  for Dennard Nip, MD  I have reviewed the above documentation for accuracy and completeness, and I agree with the above. -Dennard Nip, MD

## 2017-01-28 DIAGNOSIS — H04123 Dry eye syndrome of bilateral lacrimal glands: Secondary | ICD-10-CM | POA: Diagnosis not present

## 2017-01-28 DIAGNOSIS — H40013 Open angle with borderline findings, low risk, bilateral: Secondary | ICD-10-CM | POA: Diagnosis not present

## 2017-01-28 DIAGNOSIS — H5509 Other forms of nystagmus: Secondary | ICD-10-CM | POA: Diagnosis not present

## 2017-01-28 DIAGNOSIS — H40053 Ocular hypertension, bilateral: Secondary | ICD-10-CM | POA: Diagnosis not present

## 2017-01-29 ENCOUNTER — Encounter (INDEPENDENT_AMBULATORY_CARE_PROVIDER_SITE_OTHER): Payer: Self-pay | Admitting: Family Medicine

## 2017-01-30 DIAGNOSIS — F411 Generalized anxiety disorder: Secondary | ICD-10-CM | POA: Diagnosis not present

## 2017-02-02 MED FILL — QUETIAPINE FUMARATE 400 MG: 400 | 30 days supply | Qty: 45 | Fill #12

## 2017-02-02 MED FILL — clonazePAM 1 MG TABS: 1 | 30 days supply | Qty: 60 | Fill #2

## 2017-02-04 DIAGNOSIS — F411 Generalized anxiety disorder: Secondary | ICD-10-CM | POA: Diagnosis not present

## 2017-02-04 MED FILL — lamoTRIgine 200 MG TABS: 200 | 30 days supply | Qty: 30 | Fill #0

## 2017-02-09 ENCOUNTER — Ambulatory Visit (INDEPENDENT_AMBULATORY_CARE_PROVIDER_SITE_OTHER): Payer: 59 | Admitting: Family Medicine

## 2017-02-09 VITALS — BP 118/77 | HR 86 | Temp 98.4°F | Ht 65.0 in | Wt 259.0 lb

## 2017-02-09 DIAGNOSIS — J069 Acute upper respiratory infection, unspecified: Secondary | ICD-10-CM | POA: Diagnosis not present

## 2017-02-09 DIAGNOSIS — R7303 Prediabetes: Secondary | ICD-10-CM

## 2017-02-09 DIAGNOSIS — F319 Bipolar disorder, unspecified: Secondary | ICD-10-CM

## 2017-02-09 MED ORDER — LIRAGLUTIDE -WEIGHT MANAGEMENT 18 MG/3ML ~~LOC~~ SOPN: 1.2000 mg | PEN_INJECTOR | Freq: Every day | SUBCUTANEOUS | 0 refills | Status: AC

## 2017-02-09 NOTE — Progress Notes (Signed)
Office: [phone]  /  Fax: [phone]   HPI:   Chief Complaint: OBESITY Caroline Gonzales is here to discuss her progress with her obesity treatment plan. She is on the  follow the Category 3 plan and is following her eating plan approximately 70 % of the time. She states she is exercising with trainer once a week, cardio/weights 30 minutes 1 time per week. Caroline Gonzales has done well with weight maintenance. She is on Saxenda. She has decreased protein and increased sodas and simple carbohydrates last week while sick with URI. She is ready to get back on track. Her weight is 259 lb (117.5 kg) today and has had a weight gain of 2 pounds over a period of 2 weeks since her last visit. She has lost 19 lbs since starting treatment with [name].  Pre-Diabetes Caroline Gonzales has a diagnosis of pre-diabetes based on her elevated Hgb A1c and was informed this puts her at greater risk of developing diabetes. She started Saxenda approximately 3 weeks ago and discontinued taking metformin. She is working on diet prescription to improve glucose and A1c. She continues to work on diet and exercise to decrease risk of diabetes. Nausea was resolved with Saxenda and she denies hypoglycemia.  Viral Upper Respiratory Infection Caroline Gonzales is on Sudafed, Mucinex, Motrin and Cepacol for viral upper respiratory infection.  Bipolar Disorder Caroline Gonzales is on multiple medications, some increased emotional lability in the last month and some increased irritability. She is still seeing her therapist weekly. She notes some increased emotional eating as well. She is planning on changing to another Psychiatrist PA.   ALLERGIES: Allergies  Allergen Reactions  . Tamiflu [Oseltamivir Phosphate] Anaphylaxis  . Cephalosporins Rash  . Latex Rash  . Penicillins Rash    MEDICATIONS: Current Outpatient Prescriptions on File Prior to Visit  Medication Sig Dispense Refill  . cetirizine (ZYRTEC) 10 MG tablet Take 10 mg by mouth at bedtime.    .  Cholecalciferol (VITAMIN D3) 5000 units CAPS Take 5,000 Units by mouth at bedtime.    . clonazePAM (KLONOPIN) 1 MG tablet Take 1 mg by mouth 2 (two) times daily as needed for anxiety.    [name] ibuprofen (ADVIL,MOTRIN) 200 MG tablet Take 400-600 mg by mouth daily as needed for headache or moderate pain.    . Insulin Pen Needle 31G X 6 MM MISC 1 Package by Does not apply route every morning. 30 each 0  . L-methylfolate Calcium 15 MG TABS Take 15 m by mouth daily.    [name] lamoTRIgine (LAMICTAL) 200 MG tablet Take 200 mg by mouth at bedtime.    [name] levonorgestrel (MIRENA) 20 MCG/24HR IUD 1 each by Intrauterine route once.    . loratadine (CLARITIN) 10 MG tablet Take 10 mg by mouth at bedtime.     . Multiple Vitamin (MULTIVITAMIN) tablet Take 1 tablet by mouth Nightly.    . promethazine (PHENERGAN) 25 MG tablet Take 1 tablet (25 mg total) by mouth every 8 (eight) hours as needed for nausea or vomiting. 20 tablet 0  . propranolol (INDERAL) 40 MG tablet Take 40 mg by mouth 2 (two) times daily as needed (anxiety).     . QUEtiapine (SEROQUEL) 400 MG tablet Take 600 mg by mouth at bedtime.     . ranitidine (ZANTAC) 150 MG tablet Take 150 mg by mouth at bedtime.    . triamcinolone ointment (KENALOG) 0.1 % Apply 1 application topically daily as needed (rash).   3   No current facility-administered medications on file prior  to visit.     PAST MEDICAL HISTORY: Past Medical History:  Diagnosis Date  . Allergy    Zyrtec, Allegra.  . Anxiety    followed by Dr. Toy Cookey  . Back pain   . Bipolar 1 disorder ([place])   . Complication of anesthesia   . Depression   . Fibroid   . Gallbladder problem   . Gastroparesis 09/14/2012   gastric emptying study in 2014  . GERD (gastroesophageal reflux disease)   . Pneumonia    2013ish  . PONV (postoperative nausea and vomiting)   . Pre-diabetes   . PTSD (post-traumatic stress disorder)   . Vitamin D deficiency     PAST SURGICAL HISTORY: Past Surgical History:  Procedure  Laterality Date  . CESAREAN SECTION    . CHOLECYSTECTOMY    . DILATION AND CURETTAGE OF UTERUS    . FOREIGN BODY REMOVAL Left 11/18/2016   Procedure: REMOVAL FOREIGN BODY EXTREMITY LEFT FOOT;  Surgeon: Trula Slade, DPM;  Location: [facility];  Service: Podiatry;  Laterality: Left;  . PILONIDAL CYST EXCISION  1990's  . TENDON REPAIR Left    Left Ankle  . WISDOM TOOTH EXTRACTION  19090's    SOCIAL HISTORY: Social History  Substance Use Topics  . Smoking status: Former Smoker    Years: 10.00    Types: Cigarettes    Quit date: 2003  . Smokeless tobacco: Never Used  . Alcohol use 0.0 oz/week     Comment: rarely - maybe 1 drink per month     FAMILY HISTORY: Family History  Problem Relation Age of Onset  . Cancer Mother        squamous cell carcinoma  . Hyperlipidemia Mother   . Depression Mother   . Anxiety disorder Mother   . Obesity Mother   . Heart disease Father        cardiomegaly, CHF  . Hyperlipidemia Father   . Hypertension Father   . Mental retardation Father   . High blood pressure Father   . Depression Father   . Anxiety disorder Father   . Obesity Father   . Cancer Maternal Grandmother   . Diabetes Maternal Grandmother   . Heart disease Maternal Grandmother   . Hyperlipidemia Maternal Grandmother   . Hypertension Maternal Grandmother   . Stroke Maternal Grandmother   . Heart disease Paternal Grandmother   . Hypertension Paternal Grandmother   . Heart disease Paternal Grandfather   . Hyperlipidemia Paternal Grandfather   . Mental illness Paternal Grandfather   . Breast cancer Maternal Aunt   . Thyroid cancer Paternal Aunt     ROS: Review of Systems  Constitutional: Negative for weight loss.  Gastrointestinal: Negative for nausea.  Endo/Heme/Allergies:       Negative hypoglycemia  Psychiatric/Behavioral: Positive for depression.       Increased emotional lability (some) Increased irritability (some)    PHYSICAL EXAM: Blood pressure 118/77, pulse  86, temperature 98.4 F (36.9 C), temperature source Oral, height 5\' 5"  (1.651 m), weight 259 lb (117.5 kg), SpO2 99 %. Body mass index is 43.1 kg/m. Physical Exam  Constitutional: She is oriented to person, place, and time. She appears well-developed and well-nourished.  Cardiovascular: Normal rate.   Pulmonary/Chest: Effort normal.  Musculoskeletal: Normal range of motion.  Neurological: She is oriented to person, place, and time.  Skin: Skin is warm and dry.  Vitals reviewed.   RECENT LABS AND TESTS: BMET    Component Value Date/Time   NA 138  01/11/2017 1313   K 4.3 01/11/2017 1313   CL 99 01/11/2017 1313   CO2 24 01/11/2017 1313   GLUCOSE 82 01/11/2017 1313   GLUCOSE 87 06/27/2016 0933   BUN 13 01/11/2017 1313   CREATININE 0.84 01/11/2017 1313   CREATININE 0.95 06/27/2016 0933   CALCIUM 9.1 01/11/2017 1313   GFRNONAA 87 01/11/2017 1313   GFRAA 100 01/11/2017 1313   Lab Results  Component Value Date   HGBA1C 5.3 01/11/2017   HGBA1C 5.7 (H) 08/31/2016   HGBA1C 5.6 12/01/2015   Lab Results  Component Value Date   INSULIN 17.0 01/11/2017   INSULIN 17.4 08/31/2016   CBC    Component Value Date/Time   WBC 7.9 11/12/2016 1651   WBC 8.1 06/27/2016 0941   WBC 9.9 04/30/2016 0947   RBC 4.36 11/12/2016 1651   RBC 3.96 (A) 06/27/2016 0941   RBC 4.15 04/30/2016 0947   HGB 11.5 (A) 06/27/2016 0941   HGB 11.7 04/30/2016 0947   HCT 36.5 11/12/2016 1651   PLT 332 11/12/2016 1651   MCV 84 11/12/2016 1651   MCH 27.3 11/12/2016 1651   MCH 29.0 06/27/2016 0941   MCH 28.2 04/30/2016 0947   MCHC 32.6 11/12/2016 1651   MCHC 34.5 06/27/2016 0941   MCHC 33.2 04/30/2016 0947   RDW 15.5 (H) 11/12/2016 1651   LYMPHSABS 2.2 11/12/2016 1651   MONOABS 891 04/30/2016 0947   EOSABS 0.1 11/12/2016 1651   BASOSABS 0.0 11/12/2016 1651   Iron/TIBC/Ferritin/ %Sat    Component Value Date/Time   IRON 53 08/31/2016 1644   TIBC 280 08/31/2016 1644   FERRITIN 36 08/31/2016 1644    IRONPCTSAT 19 08/31/2016 1644   Lipid Panel     Component Value Date/Time   CHOL 148 01/11/2017 1313   TRIG 133 01/11/2017 1313   HDL 39 (L) 01/11/2017 1313   CHOLHDL 4.1 12/01/2015 0914   VLDL 37 (H) 12/01/2015 0914   LDLCALC 82 01/11/2017 1313   Hepatic Function Panel     Component Value Date/Time   PROT 7.1 01/11/2017 1313   ALBUMIN 3.8 01/11/2017 1313   AST 17 01/11/2017 1313   ALT 15 01/11/2017 1313   ALKPHOS 99 01/11/2017 1313   BILITOT <0.2 01/11/2017 1313      Component Value Date/Time   TSH 1.620 08/31/2016 1644   TSH 1.83 12/01/2015 0914    ASSESSMENT AND PLAN: Prediabetes  Bipolar 1 disorder ([place])  Viral URI  Morbid obesity ([place]) - Plan: Liraglutide -Weight Management (SAXENDA) 18 MG/3ML SOPN  PLAN:  Pre-Diabetes Taci will continue to work on weight loss, exercise, and decreasing simple carbohydrates in her diet to help decrease the risk of diabetes. She was informed that eating too many simple carbohydrates or too many calories at one sitting increases the likelihood of GI side effects. Caroline Gonzales agrees to continue [name] and we will re-check labs in 2 weeks. Caroline Gonzales agreed to follow up with [name] as directed to monitor her progress.  Viral Upper Respiratory Infection Caroline Gonzales agrees to discontinue Mucinex and add nasal steroid OTC and follow up with PCP if symptoms worsen.  Bipolar Disorder Caroline Gonzales agrees to continue medications and follow up with psych to continue therapy and work on recognizing emotional eating.  We spent > than 50% of the 30 minute visit on the counseling as documented in the note.  Obesity Caroline Gonzales is currently in the action stage of change. As such, her goal is to continue with weight loss efforts She has agreed to  keep a food journal with 1300 to 1500 calories and 80+ grams of protein  Caroline Gonzales has been instructed to work up to a goal of 150 minutes of combined cardio and strengthening exercise per week for weight loss and overall  health benefits. We discussed the following Behavioral Modification Strategies today: increasing lean protein intake, decreasing simple carbohydrates  and increasing vegetables  Caroline Gonzales has agreed to follow up with our clinic in 2 weeks. She was informed of the importance of frequent follow up visits to maximize her success with intensive lifestyle modifications for her multiple health conditions.  I, Caroline Gonzales, am acting as scribe for Dennard Nip, MD  I have reviewed the above documentation for accuracy and completeness, and I agree with the above. -Dennard Nip, MD  Office: [phone]  /  Fax: [phone]  OBESITY BEHAVIORAL INTERVENTION VISIT  Today's visit was # 11 out of 22.  Starting weight: 278 lbs Starting date: 08/31/16 Today's weight : 259 lbs Today's date: 02/09/2017 Total lbs lost to date: 75 (Patients must lose 7 lbs in the first 6 months to continue with counseling)   ASK: We discussed the diagnosis of obesity with Caroline Skiff Gonzales today and Caroline Gonzales agreed to give [name] permission to discuss obesity behavioral modification therapy today.  ASSESS: Caroline Gonzales has the diagnosis of obesity and her BMI today is 43.2 [place] is in the action stage of change   ADVISE: Glynis was educated on the multiple health risks of obesity as well as the benefit of weight loss to improve her health. She was advised of the need for long term treatment and the importance of lifestyle modifications.  AGREE: Multiple dietary modification options and treatment options were discussed and  [place] agreed to keep a food journal with 1300 to 1500 calories and 80+ grams of protein  We discussed the following Behavioral Modification Strategies today: increasing lean protein intake, decreasing simple carbohydrates  and increasing vegetables

## 2017-02-10 DIAGNOSIS — F411 Generalized anxiety disorder: Secondary | ICD-10-CM | POA: Diagnosis not present

## 2017-02-13 NOTE — Addendum Note (Signed)
Addendum  created 02/13/17 1105 by Duane Boston, MD   Sign clinical note

## 2017-02-15 MED FILL — SAXENDA 18 MG/3 ML PEN: 18 | 30 days supply | Qty: 15 | Fill #1

## 2017-02-15 MED FILL — TRIAMCINOLONE 0.1% OINTMENT: 0.1 | 30 days supply | Qty: 80 | Fill #1

## 2017-02-16 ENCOUNTER — Encounter: Payer: Self-pay | Admitting: Podiatry

## 2017-02-16 NOTE — Progress Notes (Signed)
Left foot removal of foreign body

## 2017-02-17 ENCOUNTER — Telehealth: Payer: Self-pay | Admitting: Obstetrics and Gynecology

## 2017-02-17 ENCOUNTER — Encounter: Payer: Self-pay | Admitting: Obstetrics and Gynecology

## 2017-02-17 ENCOUNTER — Ambulatory Visit (INDEPENDENT_AMBULATORY_CARE_PROVIDER_SITE_OTHER): Payer: 59 | Admitting: Obstetrics and Gynecology

## 2017-02-17 VITALS — BP 108/70 | HR 88 | Resp 16 | Ht 65.0 in | Wt 260.0 lb

## 2017-02-17 DIAGNOSIS — Z30431 Encounter for routine checking of intrauterine contraceptive device: Secondary | ICD-10-CM | POA: Diagnosis not present

## 2017-02-17 DIAGNOSIS — N9089 Other specified noninflammatory disorders of vulva and perineum: Secondary | ICD-10-CM | POA: Diagnosis not present

## 2017-02-17 DIAGNOSIS — Z01411 Encounter for gynecological examination (general) (routine) with abnormal findings: Secondary | ICD-10-CM

## 2017-02-17 NOTE — Progress Notes (Signed)
41 y.o. [id number] MarriedCaucasianF here for annual exam.  No bleeding, no pain. No pain with intercourse.  She has been with her husband for 4 years. Negative GC/Chlamydia testing last year when she was having pain.   She has lost 20 lbs since December, seeing a bariatric specialist and is on medication. Not exercising much.     No LMP recorded. Patient is not currently having periods (Reason: IUD).          Sexually active: Yes.    The current method of family planning is IUD (mirena placed summer of 2014).    Exercising: No.  The patient does not participate in regular exercise at present. Smoker:  Former smoker  Health Maintenance: Pap:  12-12-15 WNL NEG HR HPV History of abnormal Pap:  no MMG:  01-02-16 WNL  Colonoscopy:  Never BMD:   Never TDaP:  2017 Gardasil: N/A   reports that she quit smoking about 15 years ago. Her smoking use included Cigarettes. She quit after 10.00 years of use. She has never used smokeless tobacco. She reports that she drinks alcohol. 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 Allergy    Zyrtec, Allegra.  . Anxiety    followed by Dr. Toy Cookey  . Back pain   . Bipolar 1 disorder ([place])   . Complication of anesthesia   . Depression   . Fibroid   . Gallbladder problem   . Gastroparesis 09/14/2012   gastric emptying study in 2014  . GERD (gastroesophageal reflux disease)   . Pneumonia    2013ish  . PONV (postoperative nausea and vomiting)   . Pre-diabetes   . PTSD (post-traumatic stress disorder)   . Vitamin D deficiency     Past Surgical History:  Procedure Laterality Date  . CESAREAN SECTION    . CHOLECYSTECTOMY    . DILATION AND CURETTAGE OF UTERUS    . FOREIGN BODY REMOVAL Left 11/18/2016   Procedure: REMOVAL FOREIGN BODY EXTREMITY LEFT FOOT;  Surgeon: Trula Slade,  DPM;  Location: [facility];  Service: Podiatry;  Laterality: Left;  . PILONIDAL CYST EXCISION  1990's  . TENDON REPAIR Left    Left Ankle  . WISDOM TOOTH EXTRACTION  19090's    Current Outpatient Prescriptions  Medication Sig Dispense Refill  . cetirizine (ZYRTEC) 10 MG tablet Take 10 mg by mouth at bedtime.    . Cholecalciferol (VITAMIN D3) 5000 units CAPS Take 5,000 Units by mouth at bedtime.    . clonazePAM (KLONOPIN) 1 MG tablet Take 1 mg by mouth 2 (two) times daily as needed for anxiety.    [name] ibuprofen (ADVIL,MOTRIN) 200 MG tablet Take 400-600 mg by mouth daily as needed for headache or moderate pain.    . Insulin Pen Needle 31G X 6 MM MISC 1 Package by Does not apply route every morning. 30 each 0  . L-methylfolate Calcium 15 MG TABS Take 15 m by mouth daily.    [name] lamoTRIgine (LAMICTAL) 200 MG tablet Take 200 mg by mouth at bedtime.    [name] levonorgestrel (MIRENA) 20 MCG/24HR IUD 1 each by Intrauterine route once.    . Liraglutide -Weight Management (SAXENDA) 18 MG/3ML SOPN Inject 1.2 mg into the skin daily. 3 pen 0  . loratadine (CLARITIN) 10 MG tablet Take 10 mg by mouth  at bedtime.     . Multiple Vitamin (MULTIVITAMIN) tablet Take 1 tablet by mouth Nightly.    . promethazine (PHENERGAN) 25 MG tablet Take 1 tablet (25 mg total) by mouth every 8 (eight) hours as needed for nausea or vomiting. 20 tablet 0  . propranolol (INDERAL) 40 MG tablet Take 40 mg by mouth 2 (two) times daily as needed (anxiety).     . QUEtiapine (SEROQUEL) 400 MG tablet Take 600 mg by mouth at bedtime.     . ranitidine (ZANTAC) 150 MG tablet Take 150 mg by mouth at bedtime.    . triamcinolone ointment (KENALOG) 0.1 % Apply 1 application topically daily as needed (rash).   3   No current facility-administered medications for this visit.     Family History  Problem Relation Age of Onset  . Cancer Mother        squamous cell carcinoma  . Hyperlipidemia Mother   . Depression Mother   . Anxiety disorder Mother    . Obesity Mother   . Heart disease Father        cardiomegaly, CHF  . Hyperlipidemia Father   . Hypertension Father   . Mental retardation Father   . High blood pressure Father   . Depression Father   . Anxiety disorder Father   . Obesity Father   . Cancer Maternal Grandmother   . Diabetes Maternal Grandmother   . Heart disease Maternal Grandmother   . Hyperlipidemia Maternal Grandmother   . Hypertension Maternal Grandmother   . Stroke Maternal Grandmother   . Heart disease Paternal Grandmother   . Hypertension Paternal Grandmother   . Heart disease Paternal Grandfather   . Hyperlipidemia Paternal Grandfather   . Mental illness Paternal Grandfather   . Breast cancer Maternal Aunt   . Thyroid cancer Paternal Aunt     Review of Systems  Constitutional: Negative.   HENT: Negative.   Eyes: Negative.   Respiratory: Negative.   Cardiovascular: Negative.   Gastrointestinal: Negative.   Endocrine: Negative.   Genitourinary: Negative.   Musculoskeletal: Negative.   Skin: Negative.   Allergic/Immunologic: Negative.   Neurological: Negative.   Psychiatric/Behavioral: Negative.     Exam:   BP 108/70 (BP Location: Right Arm, Patient Position: Sitting, Cuff Size: Normal)   Pulse 88   Resp 16   Ht 5\' 5"  (1.651 m)   Wt 260 lb (117.9 kg)   BMI 43.27 kg/m   Weight change: @WEIGHTCHANGE @ Height:   Height: 5\' 5"  (165.1 cm)  Ht Readings from Last 3 Encounters:  02/17/17 5\' 5"  (1.651 m)  02/09/17 5\' 5"  (1.651 m)  01/25/17 5\' 5"  (1.651 m)    General appearance: alert, cooperative and appears stated age Head: Normocephalic, without obvious abnormality, atraumatic Neck: no adenopathy, supple, symmetrical, trachea midline and thyroid normal to inspection and palpation Lungs: clear to auscultation bilaterally Cardiovascular: regular rate and rhythm Breasts: normal appearance, no masses or tenderness Abdomen: soft, non-tender; bowel sounds normal; no masses,  no  organomegaly Extremities: extremities normal, atraumatic, no cyanosis or edema Skin: Skin color, texture, turgor normal. No rashes or lesions Lymph nodes: Cervical, supraclavicular, and axillary nodes normal. No abnormal inguinal nodes palpated Neurologic: Grossly normal   Pelvic: External genitalia: in the posterior fourchette there is a 1.5 x  0.3 cm warty appearing raised lesion, gray/white in color, firm. Thre are a few ? Warty appearing lesions on the left inner buttock  Urethra:  normal appearing urethra with no masses, tenderness or lesions              Bartholins and Skenes: normal                 Vagina: normal appearing vagina with normal color and discharge, no lesions              Cervix: no lesions, IUD strings 3 cm               Bimanual Exam:  Uterus:  normal size, contour, position, consistency, mobility, non-tender              Adnexa: no mass, fullness, tenderness               Rectovaginal: Confirms               Anus:  normal sphincter tone, no lesions  Chaperone was present for exam.  A:  Well Woman with normal exam  IUD check  Vulvar lesion, warty appearing, some discoloration  P:   Labs with Bariatric MD  No pap this year  Mammogram, she will schedule  Discussed breast self exam  Discussed calcium and vit D intake  Plans to have the IUD replaced next summer  Return for vulvar biopsy, will also remove lesion on upper inner left buttock

## 2017-02-17 NOTE — Telephone Encounter (Signed)
Patient scheduled a vulvar bx tomorrow with Dr. Talbert Nan at 1:00pm. Patient is aware you will need to call her insurance and will let her know her benefits.

## 2017-02-18 ENCOUNTER — Encounter: Payer: Self-pay | Admitting: Obstetrics and Gynecology

## 2017-02-18 ENCOUNTER — Ambulatory Visit: Payer: 59 | Admitting: Obstetrics and Gynecology

## 2017-02-18 ENCOUNTER — Ambulatory Visit (INDEPENDENT_AMBULATORY_CARE_PROVIDER_SITE_OTHER): Payer: 59 | Admitting: Obstetrics and Gynecology

## 2017-02-18 VITALS — BP 110/72 | HR 80 | Resp 16 | Ht 65.5 in | Wt 261.0 lb

## 2017-02-18 DIAGNOSIS — K629 Disease of anus and rectum, unspecified: Secondary | ICD-10-CM

## 2017-02-18 DIAGNOSIS — A630 Anogenital (venereal) warts: Secondary | ICD-10-CM | POA: Diagnosis not present

## 2017-02-18 DIAGNOSIS — N9089 Other specified noninflammatory disorders of vulva and perineum: Secondary | ICD-10-CM | POA: Diagnosis not present

## 2017-02-18 NOTE — Patient Instructions (Signed)
Vulvar Biopsy Post-procedure Instructions . You may take Ibuprofen, Aleve, or Tylenol for any pain or discomfort. . You may have a small amount of spotting.  You should wear a mini pad for the next few days. . You may use some topical Neosporin ointment (over the counter) if you would like.   . You will need to call the office if you have redness around the biopsy site, if there is any unusual drainage, if the bleeding is heavy, or if you have any concerns.   . Shower or bathe as normal . You will be notified within one week of your biopsy results or we will discuss your results at your follow-up appointment if needed.  

## 2017-02-18 NOTE — Telephone Encounter (Signed)
Spoke with patient regarding benefit for scheduled vulvar biopsy. See account notes for details. Patient understood information presented. Patient is scheduled 02/18/17 with Dr Talbert Nan. Patient is aware of date, arrival time and cancellation policy.  No further questions. Ok to close

## 2017-02-18 NOTE — Progress Notes (Signed)
GYNECOLOGY  VISIT   HPI: 41 y.o.   Married  Caucasian  female   [phone] with No LMP recorded. Patient is not currently having periods (Reason: IUD).   here for vulvar biopsy.   She was noted to have a slightly atypically looking warty lesion in the posterior fourchette at the time of her annual exam, also with a few small warty appearing lesions on the inner left buttock (perianal area).  GYNECOLOGIC HISTORY: No LMP recorded. Patient is not currently having periods (Reason: IUD). Contraception:IUD Menopausal hormone therapy: none        OB History    Gravida Para Term Preterm AB Living   3 3 3     3    SAB TAB Ectopic Multiple Live Births           3         Patient Active Problem List   Diagnosis Date Noted  . Residual foreign body in soft tissue 11/12/2016  . Prediabetes 11/02/2016  . Morbid obesity ([place]) 10/15/2016  . Bipolar 1 disorder ([place])     Past Medical History:  Diagnosis Date  . Allergy    Zyrtec, Allegra.  . Anxiety    followed by Dr. Toy Cookey  . Back pain   . Bipolar 1 disorder ([place])   . Complication of anesthesia   . Depression   . Fibroid   . Gallbladder problem   . Gastroparesis 09/14/2012   gastric emptying study in 2014  . GERD (gastroesophageal reflux disease)   . Pneumonia    2013ish  . PONV (postoperative nausea and vomiting)   . Pre-diabetes   . PTSD (post-traumatic stress disorder)   . Vitamin D deficiency     Past Surgical History:  Procedure Laterality Date  . CESAREAN SECTION    . CHOLECYSTECTOMY    . DILATION AND CURETTAGE OF UTERUS    . FOREIGN BODY REMOVAL Left 11/18/2016   Procedure: REMOVAL FOREIGN BODY EXTREMITY LEFT FOOT;  Surgeon: Trula Slade, DPM;  Location: [facility];  Service: Podiatry;  Laterality: Left;  . PILONIDAL CYST EXCISION  1990's  . TENDON REPAIR Left    Left Ankle  . WISDOM TOOTH EXTRACTION  19090's    Current Outpatient Prescriptions  Medication Sig Dispense Refill  . cetirizine (ZYRTEC) 10 MG tablet Take 10  mg by mouth at bedtime.    . Cholecalciferol (VITAMIN D3) 5000 units CAPS Take 5,000 Units by mouth at bedtime.    . clonazePAM (KLONOPIN) 1 MG tablet Take 1 mg by mouth 2 (two) times daily as needed for anxiety.    [name] ibuprofen (ADVIL,MOTRIN) 200 MG tablet Take 400-600 mg by mouth daily as needed for headache or moderate pain.    . Insulin Pen Needle 31G X 6 MM MISC 1 Package by Does not apply route every morning. 30 each 0  . L-methylfolate Calcium 15 MG TABS Take 15 m by mouth daily.    [name] lamoTRIgine (LAMICTAL) 200 MG tablet Take 200 mg by mouth at bedtime.    [name] levonorgestrel (MIRENA) 20 MCG/24HR IUD 1 each by Intrauterine route once.    . Liraglutide -Weight Management (SAXENDA) 18 MG/3ML SOPN Inject 1.2 mg into the skin daily. 3 pen 0  . loratadine (CLARITIN) 10 MG tablet Take 10 mg by mouth at bedtime.     . Multiple Vitamin (MULTIVITAMIN) tablet Take 1 tablet by mouth Nightly.    . promethazine (PHENERGAN) 25 MG tablet Take 1 tablet (25 mg total) by mouth  every 8 (eight) hours as needed for nausea or vomiting. 20 tablet 0  . propranolol (INDERAL) 40 MG tablet Take 40 mg by mouth 2 (two) times daily as needed (anxiety).     . QUEtiapine (SEROQUEL) 400 MG tablet Take 600 mg by mouth at bedtime.     . ranitidine (ZANTAC) 150 MG tablet Take 150 mg by mouth at bedtime.    . triamcinolone ointment (KENALOG) 0.1 % Apply 1 application topically daily as needed (rash).   3   No current facility-administered medications for this visit.      ALLERGIES: Tamiflu [oseltamivir phosphate]; Cephalosporins; Latex; and Penicillins  Family History  Problem Relation Age of Onset  . Cancer Mother        squamous cell carcinoma  . Hyperlipidemia Mother   . Depression Mother   . Anxiety disorder Mother   . Obesity Mother   . Heart disease Father        cardiomegaly, CHF  . Hyperlipidemia Father   . Hypertension Father   . Mental retardation Father   . High blood pressure Father   . Depression  Father   . Anxiety disorder Father   . Obesity Father   . Cancer Maternal Grandmother   . Diabetes Maternal Grandmother   . Heart disease Maternal Grandmother   . Hyperlipidemia Maternal Grandmother   . Hypertension Maternal Grandmother   . Stroke Maternal Grandmother   . Heart disease Paternal Grandmother   . Hypertension Paternal Grandmother   . Heart disease Paternal Grandfather   . Hyperlipidemia Paternal Grandfather   . Mental illness Paternal Grandfather   . Breast cancer Maternal Aunt   . Thyroid cancer Paternal Aunt     Social History   Social History  . Marital status: Married    Spouse name: N/A  . Number of children: N/A  . Years of education: N/A   Occupational History  . RN [employer]   Social History Main Topics  . Smoking status: Former Smoker    Years: 10.00    Types: Cigarettes    Quit date: 2003  . Smokeless tobacco: Never Used  . Alcohol use 0.0 oz/week     Comment: 1-2 per month   . Drug use: No  . Sexual activity: Yes    Partners: Male    Birth control/ protection: IUD   Other Topics Concern  . 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      Seatbelt: 100%; no texting while driving often        Review of Systems  Constitutional: Negative.   HENT: Negative.   Eyes: Negative.   Respiratory: Negative.   Cardiovascular: Negative.   Gastrointestinal: Negative.   Genitourinary: Negative.   Musculoskeletal: Negative.   Skin: Negative.   Neurological: Negative.   Endo/Heme/Allergies: Negative.   Psychiatric/Behavioral: Negative.     PHYSICAL EXAMINATION:    BP 110/72 (BP Location: Right Arm, Patient Position:  Sitting, Cuff Size: Large)   Pulse 80   Resp 16   Ht 5'  5.5" (1.664 m)   Wt 261 lb (118.4 kg)   BMI 42.77 kg/m     General appearance: alert, cooperative and appears stated age  Pelvic: External genitalia:  Raised, warty appearing lesion, white/gray in color in the posterior fourchette.              Anus:  On the inner left buttock are 3 small warty appearing lesions right next to each other  Vulvar and perianal biopsies: The risks of the procedure were reviewed with the patient and a consent was signed. The areas were cleansed with betadine and injected with 1% lidocaine. A #11 blade was used to remove an elliptical  piece of tissue around each lesion. The defects were closed with simple stitches of 4-0 vicryl (2 stitches in the posterior fourchette and 3 in the perianal region). The patient tolerated the procedure well.    Chaperone was present for exam.  ASSESSMENT Vulvar and perianal lesions    PLAN Removed and sent to pathology Routine f/u    An After Visit Summary was printed and given to the patient.

## 2017-02-20 DIAGNOSIS — F411 Generalized anxiety disorder: Secondary | ICD-10-CM | POA: Diagnosis not present

## 2017-02-24 ENCOUNTER — Encounter: Payer: Self-pay | Admitting: Obstetrics and Gynecology

## 2017-02-26 ENCOUNTER — Telehealth: Payer: Self-pay | Admitting: *Deleted

## 2017-02-26 NOTE — Telephone Encounter (Signed)
Dr. Sabra Heck -Patient advised of results via MyChart as seen below, any additional recommendations for spouse?  Cc: Dr. Talbert Nan  Visit Follow-Up Question  Message [id number]  From Marjie Skiff Gallier To Salvadore Dom, MD Sent 02/24/2017 6:12 PM  I have a question about SURGICAL PATHOLOGY EXAM resulted on 02/24/17, 12:00 AM.   Do I need to do anything else? Any treatments? Just do a good exam of hubby and have him see a urologist?    Notes recorded by Salvadore Dom, MD on 02/24/2017 at 6:07 PM EDT The following mychart message was sent:  [facility], Your biopsies were both consistent with condyloma or genital warts. I removed all of the abnormal areas. Let me know if you develop any other areas of concern.  Please call the office with any questions or concerns. Caroline Gonzales

## 2017-02-26 NOTE — Telephone Encounter (Signed)
Spoke with patient, advised as seen below per Dr. Sabra Heck. Patient verbalizes understanding and is agreeable. Patient would like to schedule OV for further discussion, unable to at the moment, will return call later today to schedule for next week.

## 2017-02-26 NOTE — Telephone Encounter (Signed)
See telephone encounter dated 02/26/17 to review with provider.

## 2017-02-26 NOTE — Telephone Encounter (Signed)
Reviewed with patient via telephone, see encounter dated 02/26/17.

## 2017-02-26 NOTE — Telephone Encounter (Signed)
She may benefit from a follow-up visit to ensure she has no new findings and to answer all of her questions.  If husband has no abnormal appearing skin lesions, ok to just monitor.  CC:  Dr. Talbert Nan

## 2017-03-01 ENCOUNTER — Encounter: Payer: Self-pay | Admitting: Obstetrics and Gynecology

## 2017-03-01 ENCOUNTER — Ambulatory Visit (INDEPENDENT_AMBULATORY_CARE_PROVIDER_SITE_OTHER): Payer: 59 | Admitting: Obstetrics and Gynecology

## 2017-03-01 VITALS — BP 110/60 | HR 88 | Resp 16 | Wt 261.0 lb

## 2017-03-01 DIAGNOSIS — A630 Anogenital (venereal) warts: Secondary | ICD-10-CM | POA: Diagnosis not present

## 2017-03-01 NOTE — Progress Notes (Signed)
GYNECOLOGY  VISIT   HPI: 41 y.o.   Married  Caucasian  female   [phone] with No LMP recorded. Patient is not currently having periods (Reason: IUD).   here for follow up from vulvar biopsy. Patient is c/o of irritation at [place] site on her inner buttock. It is uncomfortable today as it did when it was first done. Sore at the biopsy site. She has had negative GC/Chlamydia last summer and negative HIV testing in 3/17. She is in a monogamous relationship with her husband, totally trusts him.  GYNECOLOGIC HISTORY: No LMP recorded. Patient is not currently having periods (Reason: IUD). Contraception:IUD Menopausal hormone therapy: none         OB History    Gravida Para Term Preterm AB Living   3 3 3     3    SAB TAB Ectopic Multiple Live Births           3         Patient Active Problem List   Diagnosis Date Noted  . Residual foreign body in soft tissue 11/12/2016  . Prediabetes 11/02/2016  . Morbid obesity ([place]) 10/15/2016  . Bipolar 1 disorder ([place])     Past Medical History:  Diagnosis Date  . Allergy    Zyrtec, Allegra.  . Anxiety    followed by Dr. Toy Cookey  . Back pain   . Bipolar 1 disorder ([place])   . Complication of anesthesia   . Depression   . Fibroid   . Gallbladder problem   . Gastroparesis 09/14/2012   gastric emptying study in 2014  . GERD (gastroesophageal reflux disease)   . Pneumonia    2013ish  . PONV (postoperative nausea and vomiting)   . Pre-diabetes   . PTSD (post-traumatic stress disorder)   . Vitamin D deficiency     Past Surgical History:  Procedure Laterality Date  . CESAREAN SECTION    . CHOLECYSTECTOMY    . DILATION AND CURETTAGE OF UTERUS    . FOREIGN BODY REMOVAL Left 11/18/2016   Procedure: REMOVAL FOREIGN BODY EXTREMITY LEFT FOOT;  Surgeon: Trula Slade, DPM;  Location: [facility];  Service: Podiatry;  Laterality: Left;  . PILONIDAL CYST EXCISION  1990's  . TENDON REPAIR Left    Left Ankle  . WISDOM TOOTH EXTRACTION  19090's    Current  Outpatient Prescriptions  Medication Sig Dispense Refill  . cetirizine (ZYRTEC) 10 MG tablet Take 10 mg by mouth at bedtime.    . Cholecalciferol (VITAMIN D3) 5000 units CAPS Take 5,000 Units by mouth at bedtime.    . clonazePAM (KLONOPIN) 1 MG tablet Take 1 mg by mouth 2 (two) times daily as needed for anxiety.    [name] ibuprofen (ADVIL,MOTRIN) 200 MG tablet Take 400-600 mg by mouth daily as needed for headache or moderate pain.    . Insulin Pen Needle 31G X 6 MM MISC 1 Package by Does not apply route every morning. 30 each 0  . L-methylfolate Calcium 15 MG TABS Take 15 m by mouth daily.    [name] lamoTRIgine (LAMICTAL) 200 MG tablet Take 200 mg by mouth at bedtime.    [name] levonorgestrel (MIRENA) 20 MCG/24HR IUD 1 each by Intrauterine route once.    . Liraglutide -Weight Management (SAXENDA) 18 MG/3ML SOPN Inject 1.2 mg into the skin daily. 3 pen 0  . loratadine (CLARITIN) 10 MG tablet Take 10 mg by mouth at bedtime.     . Multiple Vitamin (MULTIVITAMIN) tablet Take 1 tablet by  mouth Nightly.    . promethazine (PHENERGAN) 25 MG tablet Take 1 tablet (25 mg total) by mouth every 8 (eight) hours as needed for nausea or vomiting. 20 tablet 0  . propranolol (INDERAL) 40 MG tablet Take 40 mg by mouth 2 (two) times daily as needed (anxiety).     . QUEtiapine (SEROQUEL) 400 MG tablet Take 600 mg by mouth at bedtime.     . ranitidine (ZANTAC) 150 MG tablet Take 150 mg by mouth at bedtime.    . triamcinolone ointment (KENALOG) 0.1 % Apply 1 application topically daily as needed (rash).   3   No current facility-administered medications for this visit.      ALLERGIES: Tamiflu [oseltamivir phosphate]; Cephalosporins; Latex; and Penicillins  Family History  Problem Relation Age of Onset  . Cancer Mother        squamous cell carcinoma  . Hyperlipidemia Mother   . Depression Mother   . Anxiety disorder Mother   . Obesity Mother   . Heart disease Father        cardiomegaly, CHF  . Hyperlipidemia Father   .  Hypertension Father   . Mental retardation Father   . High blood pressure Father   . Depression Father   . Anxiety disorder Father   . Obesity Father   . Cancer Maternal Grandmother   . Diabetes Maternal Grandmother   . Heart disease Maternal Grandmother   . Hyperlipidemia Maternal Grandmother   . Hypertension Maternal Grandmother   . Stroke Maternal Grandmother   . Heart disease Paternal Grandmother   . Hypertension Paternal Grandmother   . Heart disease Paternal Grandfather   . Hyperlipidemia Paternal Grandfather   . Mental illness Paternal Grandfather   . Breast cancer Maternal Aunt   . Thyroid cancer Paternal Aunt     Social History   Social History  . Marital status: Married    Spouse name: N/A  . Number of children: N/A  . Years of education: N/A   Occupational History  . RN [employer]   Social History Main Topics  . Smoking status: Former Smoker    Years: 10.00    Types: Cigarettes    Quit date: 2003  . Smokeless tobacco: Never Used  . Alcohol use 0.0 oz/week     Comment: 1-2 per month   . Drug use: No  . Sexual activity: Yes    Partners: Male    Birth control/ protection: IUD   Other Topics Concern  . 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      Seatbelt: 100%; no texting while driving often        Review of Systems  Constitutional: Negative.   HENT: Negative.   Eyes: Negative.   Respiratory: Negative.   Cardiovascular: Negative.   Gastrointestinal: Negative.   Genitourinary:       Irritation at [place] site near rectum   Musculoskeletal: Negative.   Skin: Negative.   Neurological: Negative.   Endo/Heme/Allergies:  Negative.   Psychiatric/Behavioral: Negative.  PHYSICAL EXAMINATION:    BP 110/60 (BP Location: Right Arm, Patient Position: Sitting, Cuff Size: Large)   Pulse 88   Resp 16   Wt 261 lb (118.4 kg)   BMI 42.77 kg/m     General appearance: alert, cooperative and appears stated age  Pelvic: External genitalia:  no lesions, the biopsy site from the posterior fourchette appears healed, stitches still in place. The biopsy site from the upper inner buttock on the left side is healing well, one of the stitches has pulled out and it is healing by secondary intention in the middle of the site. No surrounding erythema, no drainage, no signs of infection.                Chaperone was present for exam.  ASSESSMENT Vulvar biopsies from last week returned with condyloma One biopsy site is healing slower than the other, no signs of infection    PLAN Discussed HPV, incidence, recurrence, transmission Information given from UTD and epic Negative STD testing in the last 15 months, she declines further testing   An After Visit Summary was printed and given to the patient.  15 minutes face to face time of which over 50% was spent in counseling.

## 2017-03-01 NOTE — Patient Instructions (Signed)
Genital Warts Genital warts are a common STD (sexually transmitted disease). They may appear as small bumps on the tissues of the genital area or anal area. Sometimes, they can become irritated and cause pain. Genital warts are easily passed to other people through sexual contact. Getting treatment is important because genital warts can lead to other problems. In females, the virus that causes genital warts may increase the risk of cervical cancer. What are the causes? Genital warts are caused by a virus that is called human papillomavirus (HPV). HPV is spread by having unprotected sex with an infected person. It can be spread through vaginal, anal, and oral sex. Many people do not know that they are infected. They may be infected for years without problems. However, even if they do not have problems, they can pass the infection to their sexual partners. What increases the risk? Genital warts are more likely to develop in:  People who have unprotected sex.  People who have multiple sexual partners.  People who become sexually active before they are 41 years of age.  Men who are not circumcised.  Women who have a female sexual partner who is not circumcised.  People who have a weakened body defense system (immune system) due to disease or medicine.  People who smoke.  What are the signs or symptoms? Symptoms of genital warts include:  Small growths in the genital area or anal area. These warts often grow in clusters.  Itching and irritation in the genital area or anal area.  Bleeding from the warts.  Painful sexual intercourse.  How is this diagnosed? Genital warts can usually be diagnosed from their appearance on the vagina, vulva, penis, perineum, anus, or rectum. Tests may also be done, such as:  Biopsy. A tissue sample is removed so it can be looked at under a microscope.  Colposcopy. In females, a magnifying tool is used to examine the vagina and cervix. Certain solutions may  be used to make the HPV cells change color so they can be seen more easily.  A Pap test in females.  Tests for other STDs.  How is this treated? Treatment for genital warts may include:  Applying prescription medicines to the warts. These may be solutions or creams.  Freezing the warts with liquid nitrogen (cryotherapy).  Burning the warts with: ? Laser treatment. ? An electrified probe (electrocautery).  Injecting a substance (Candida antigen or Trichophyton antigen) into the warts to help the body's immune system to fight off the warts.  Interferon injections.  Surgery to remove the warts.  Follow these instructions at home: Medicines  Apply over-the-counter and prescription medicines only as told by your health care provider.  Do not treat genital warts with medicines that are used for treating hand warts.  Talk with your health care provider about using over-the-counter anti-itch creams. General instructions  Do not touch or scratch the warts.  Do not have sex until your treatment has been completed.  Tell your current and past sexual partners about your condition because they may also need treatment.  Keep all follow-up visits as told by your health care provider. This is important.  After treatment, use condoms during sex to prevent future infections. Other Instructions for Women  Women who have genital warts might need increased screening for cervical cancer. This type of cancer is slow growing and can be cured if it is found early. Chances of developing cervical cancer are increased with HPV.  If you become pregnant, tell your health   care provider that you have had HPV. Your health care provider will monitor you closely during pregnancy to be sure that your baby is safe. How is this prevented? Talk with your health care provider about getting the HPV vaccines. These vaccines prevent some HPV infections and cancers. It is recommended that the vaccine be given to  males and females who are 9-26 years of age. It will not work if you already have HPV, and it is not recommended for pregnant women. Contact a health care provider if:  You have redness, swelling, or pain in the area of the treated skin.  You have a fever.  You feel generally ill.  You feel lumps in and around your genital area or anal area.  You have bleeding in your genital area or anal area.  You have pain during sexual intercourse. This information is not intended to replace advice given to you by your health care provider. Make sure you discuss any questions you have with your health care provider. Document Released: 08/28/2000 Document Revised: 02/06/2016 Document Reviewed: 11/26/2014 Elsevier Interactive Patient Education  2018 [employer]  

## 2017-03-02 ENCOUNTER — Ambulatory Visit (INDEPENDENT_AMBULATORY_CARE_PROVIDER_SITE_OTHER): Payer: 59 | Admitting: Family Medicine

## 2017-03-02 VITALS — BP 107/70 | HR 83 | Temp 97.8°F | Ht 65.0 in | Wt 257.0 lb

## 2017-03-02 DIAGNOSIS — F3289 Other specified depressive episodes: Secondary | ICD-10-CM

## 2017-03-02 DIAGNOSIS — E559 Vitamin D deficiency, unspecified: Secondary | ICD-10-CM | POA: Diagnosis not present

## 2017-03-02 NOTE — Progress Notes (Signed)
Office: [phone]  /  Fax: [phone]   HPI:   Chief Complaint: OBESITY Caroline Gonzales is here to discuss her progress with her obesity treatment plan. She is on the  follow the Category 3 plan and is following her eating plan approximately 15 % of the time. She states she is exercising 10,000 steps per day. Alec continues to do well with weight loss. She has increased family stress with step-daughter. Her hunger is controlled. She has increase with emotional eating due to stress. She denies nausea or vomiting with Saxenda. Her weight is 257 lb (116.6 kg) today and has had a weight loss of 2 pounds over a period of 3 weeks since her last visit. She has lost 21 lbs since starting treatment with [name].  Vitamin D deficiency Caroline Gonzales has a diagnosis of vitamin D deficiency. She is currently taking vit D and denies nausea, vomiting or muscle weakness.  Depression with emotional eating behaviors Caroline Gonzales has increased stress at home and school. She felt more depressed 6 weeks ago and started self-harming behavior. She is scheduled to see therapist at [place] [facility] on 03/04/17. Caroline Gonzales struggles with emotional eating and using food for comfort to the extent that it is negatively impacting her health. She often snacks when she is not hungry. Caroline Gonzales sometimes feels she is out of control and then feels guilty that she made poor food choices. She has been working on behavior modification techniques to help reduce her emotional eating and has been somewhat successful. She shows no sign of suicidal or homicidal ideations.  Depression screen [facility] 2/9 11/12/2016 08/31/2016 06/27/2016 06/19/2016 12/01/2015  Decreased Interest 0 1 0 0 0  Down, Depressed, Hopeless 0 3 0 0 0  PHQ - 2 Score 0 4 0 0 0  Altered sleeping - 1 - - -  Tired, decreased energy - 3 - - -  Change in appetite - 3 - - -  Feeling bad or failure about yourself  - 3 - - -  Trouble concentrating - 0 - - -  Moving slowly or  fidgety/restless - 2 - - -  Suicidal thoughts - 1 - - -  PHQ-9 Score - 17 - - -      ALLERGIES: Allergies  Allergen Reactions  . Tamiflu [Oseltamivir Phosphate] Anaphylaxis  . Cephalosporins Rash  . Latex Rash  . Penicillins Rash    MEDICATIONS: Current Outpatient Prescriptions on File Prior to Visit  Medication Sig Dispense Refill  . cetirizine (ZYRTEC) 10 MG tablet Take 10 mg by mouth at bedtime.    . Cholecalciferol (VITAMIN D3) 5000 units CAPS Take 5,000 Units by mouth at bedtime.    . clonazePAM (KLONOPIN) 1 MG tablet Take 1 mg by mouth 2 (two) times daily as needed for anxiety.    [name] ibuprofen (ADVIL,MOTRIN) 200 MG tablet Take 400-600 mg by mouth daily as needed for headache or moderate pain.    . Insulin Pen Needle 31G X 6 MM MISC 1 Package by Does not apply route every morning. 30 each 0  . L-methylfolate Calcium 15 MG TABS Take 15 m by mouth daily.    [name] lamoTRIgine (LAMICTAL) 200 MG tablet Take 200 mg by mouth at bedtime.    [name] levonorgestrel (MIRENA) 20 MCG/24HR IUD 1 each by Intrauterine route once.    . Liraglutide -Weight Management (SAXENDA) 18 MG/3ML SOPN Inject 1.2 mg into the skin daily. 3 pen 0  . loratadine (CLARITIN) 10 MG tablet Take 10 mg by mouth at bedtime.     [name]  Multiple Vitamin (MULTIVITAMIN) tablet Take 1 tablet by mouth Nightly.    . promethazine (PHENERGAN) 25 MG tablet Take 1 tablet (25 mg total) by mouth every 8 (eight) hours as needed for nausea or vomiting. 20 tablet 0  . propranolol (INDERAL) 40 MG tablet Take 40 mg by mouth 2 (two) times daily as needed (anxiety).     . QUEtiapine (SEROQUEL) 400 MG tablet Take 600 mg by mouth at bedtime.     . ranitidine (ZANTAC) 150 MG tablet Take 150 mg by mouth at bedtime.    . triamcinolone ointment (KENALOG) 0.1 % Apply 1 application topically daily as needed (rash).   3   No current facility-administered medications on file prior to visit.     PAST MEDICAL HISTORY: Past Medical History:  Diagnosis Date    . Allergy    Zyrtec, Allegra.  . Anxiety    followed by Dr. Toy Cookey  . Back pain   . Bipolar 1 disorder ([place])   . Complication of anesthesia   . Depression   . Fibroid   . Gallbladder problem   . Gastroparesis 09/14/2012   gastric emptying study in 2014  . GERD (gastroesophageal reflux disease)   . Pneumonia    2013ish  . PONV (postoperative nausea and vomiting)   . Pre-diabetes   . PTSD (post-traumatic stress disorder)   . Vitamin D deficiency     PAST SURGICAL HISTORY: Past Surgical History:  Procedure Laterality Date  . CESAREAN SECTION    . CHOLECYSTECTOMY    . DILATION AND CURETTAGE OF UTERUS    . FOREIGN BODY REMOVAL Left 11/18/2016   Procedure: REMOVAL FOREIGN BODY EXTREMITY LEFT FOOT;  Surgeon: Trula Slade, DPM;  Location: [facility];  Service: Podiatry;  Laterality: Left;  . PILONIDAL CYST EXCISION  1990's  . TENDON REPAIR Left    Left Ankle  . WISDOM TOOTH EXTRACTION  19090's    SOCIAL HISTORY: Social History  Substance Use Topics  . Smoking status: Former Smoker    Years: 10.00    Types: Cigarettes    Quit date: 2003  . Smokeless tobacco: Never Used  . Alcohol use 0.0 oz/week     Comment: 1-2 per month     FAMILY HISTORY: Family History  Problem Relation Age of Onset  . Cancer Mother        squamous cell carcinoma  . Hyperlipidemia Mother   . Depression Mother   . Anxiety disorder Mother   . Obesity Mother   . Heart disease Father        cardiomegaly, CHF  . Hyperlipidemia Father   . Hypertension Father   . Mental retardation Father   . High blood pressure Father   . Depression Father   . Anxiety disorder Father   . Obesity Father   . Cancer Maternal Grandmother   . Diabetes Maternal Grandmother   . Heart disease Maternal Grandmother   . Hyperlipidemia Maternal Grandmother   . Hypertension Maternal Grandmother   . Stroke Maternal Grandmother   . Heart disease Paternal Grandmother   . Hypertension Paternal Grandmother   . Heart disease  Paternal Grandfather   . Hyperlipidemia Paternal Grandfather   . Mental illness Paternal Grandfather   . Breast cancer Maternal Aunt   . Thyroid cancer Paternal Aunt     ROS: Review of Systems  Constitutional: Positive for weight loss.  Gastrointestinal: Negative for nausea and vomiting.  Musculoskeletal:       Negative muscle weakness  Psychiatric/Behavioral:  Positive for depression. Negative for suicidal ideas.    PHYSICAL EXAM: Blood pressure 107/70, pulse 83, temperature 97.8 F (36.6 C), temperature source Oral, height 5\' 5"  (1.651 m), weight 257 lb (116.6 kg), SpO2 99 %. Body mass index is 42.77 kg/m. Physical Exam  Constitutional: She is oriented to person, place, and time. She appears well-developed and well-nourished.  Cardiovascular: Normal rate.   Pulmonary/Chest: Effort normal.  Musculoskeletal: Normal range of motion.  Neurological: She is oriented to person, place, and time.  Skin: Skin is warm and dry.  Vitals reviewed.   RECENT LABS AND TESTS: BMET    Component Value Date/Time   NA 138 01/11/2017 1313   K 4.3 01/11/2017 1313   CL 99 01/11/2017 1313   CO2 24 01/11/2017 1313   GLUCOSE 82 01/11/2017 1313   GLUCOSE 87 06/27/2016 0933   BUN 13 01/11/2017 1313   CREATININE 0.84 01/11/2017 1313   CREATININE 0.95 06/27/2016 0933   CALCIUM 9.1 01/11/2017 1313   GFRNONAA 87 01/11/2017 1313   GFRAA 100 01/11/2017 1313   Lab Results  Component Value Date   HGBA1C 5.3 01/11/2017   HGBA1C 5.7 (H) 08/31/2016   HGBA1C 5.6 12/01/2015   Lab Results  Component Value Date   INSULIN 17.0 01/11/2017   INSULIN 17.4 08/31/2016   CBC    Component Value Date/Time   WBC 7.9 11/12/2016 1651   WBC 8.1 06/27/2016 0941   WBC 9.9 04/30/2016 0947   RBC 4.36 11/12/2016 1651   RBC 3.96 (A) 06/27/2016 0941   RBC 4.15 04/30/2016 0947   HGB 11.9 11/12/2016 1651   HCT 36.5 11/12/2016 1651   PLT 332 11/12/2016 1651   MCV 84 11/12/2016 1651   MCH 27.3 11/12/2016 1651    MCH 29.0 06/27/2016 0941   MCH 28.2 04/30/2016 0947   MCHC 32.6 11/12/2016 1651   MCHC 34.5 06/27/2016 0941   MCHC 33.2 04/30/2016 0947   RDW 15.5 (H) 11/12/2016 1651   LYMPHSABS 2.2 11/12/2016 1651   MONOABS 891 04/30/2016 0947   EOSABS 0.1 11/12/2016 1651   BASOSABS 0.0 11/12/2016 1651   Iron/TIBC/Ferritin/ %Sat    Component Value Date/Time   IRON 53 08/31/2016 1644   TIBC 280 08/31/2016 1644   FERRITIN 36 08/31/2016 1644   IRONPCTSAT 19 08/31/2016 1644   Lipid Panel     Component Value Date/Time   CHOL 148 01/11/2017 1313   TRIG 133 01/11/2017 1313   HDL 39 (L) 01/11/2017 1313   CHOLHDL 4.1 12/01/2015 0914   VLDL 37 (H) 12/01/2015 0914   LDLCALC 82 01/11/2017 1313   Hepatic Function Panel     Component Value Date/Time   PROT 7.1 01/11/2017 1313   ALBUMIN 3.8 01/11/2017 1313   AST 17 01/11/2017 1313   ALT 15 01/11/2017 1313   ALKPHOS 99 01/11/2017 1313   BILITOT <0.2 01/11/2017 1313      Component Value Date/Time   TSH 1.620 08/31/2016 1644   TSH 1.83 12/01/2015 0914    ASSESSMENT AND PLAN: Vitamin D deficiency  Other depression  Morbid obesity ([place])  PLAN:  Vitamin D Deficiency Kajol was informed that low vitamin D levels contributes to fatigue and are associated with obesity, breast, and colon cancer. She agrees to continue to take prescription Vit D @50 ,000 IU every week and will follow up for routine testing of vitamin D, at least 2-3 times per year. She was informed of the risk of over-replacement of vitamin D and agrees to not increase her  dose unless he discusses this with [name] first.  Depression with Emotional Eating Behaviors We discussed behavior modification techniques today to help Caroline Gonzales deal with her emotional eating and depression. She has agreed to continue  Quetiapine and follow up with therapist as scheduled and she agreed to follow up with our clinic as directed.  We spent > than 50% of the 15 minute visit on the counseling as  documented in the note.  Obesity Caroline Gonzales is currently in the action stage of change. As such, her goal is to continue with weight loss efforts She has agreed to follow the Category 3 plan Caroline Gonzales has been instructed to work up to a goal of 150 minutes of combined cardio and strengthening exercise per week for weight loss and overall health benefits. We discussed the following Behavioral Modification Strategies today: increasing lean protein intake and emotional eating strategies   We discussed various medication options to help Caroline Gonzales with her weight loss efforts and we both agreed to continue Saxenda 1.2 mg daily, we will refill for 1 month and will follow up with our clinic in 4 weeks.  Caroline Gonzales has agreed to follow up with our clinic in 4 weeks. She was informed of the importance of frequent follow up visits to maximize her success with intensive lifestyle modifications for her multiple health conditions.  I, Doreene Nest, am acting as transcriptionist for Dennard Nip, MD  I have reviewed the above documentation for accuracy and completeness, and I agree with the above. -Dennard Nip, MD  OBESITY BEHAVIORAL INTERVENTION VISIT  Today's visit was # 12 out of 22.  Starting weight: 278 lbs Starting date: 08/31/17 Today's weight : 257 lbs Today's date: 03/02/2017 Total lbs lost to date: 21 (Patients must lose 7 lbs in the first 6 months to continue with counseling)   ASK: We discussed the diagnosis of obesity with Caroline Gonzales today and Caroline Gonzales agreed to give [name] permission to discuss obesity behavioral modification therapy today.  ASSESS: Caroline Gonzales has the diagnosis of obesity and her BMI today is 27.9 Caroline Gonzales is in the action stage of change   ADVISE: Caroline Gonzales was educated on the multiple health risks of obesity as well as the benefit of weight loss to improve her health. She was advised of the need for long term treatment and the importance of lifestyle  modifications.  AGREE: Multiple dietary modification options and treatment options were discussed and  Caroline Gonzales agreed to follow the Category 3 plan We discussed the following Behavioral Modification Strategies today: increasing lean protein intake and emotional eating strategies

## 2017-03-04 DIAGNOSIS — F4310 Post-traumatic stress disorder, unspecified: Secondary | ICD-10-CM | POA: Diagnosis not present

## 2017-03-04 MED FILL — lamoTRIgine 200 MG TABS: 200 | 30 days supply | Qty: 30 | Fill #1

## 2017-03-04 MED FILL — clonazePAM 1 MG TABS: 1 | 30 days supply | Qty: 60 | Fill #3

## 2017-03-04 MED FILL — L-METHYLFOLATE CALCIUM 15 M: 15 | 30 days supply | Qty: 30 | Fill #3

## 2017-03-11 ENCOUNTER — Telehealth: Payer: Self-pay | Admitting: Obstetrics and Gynecology

## 2017-03-11 NOTE — Telephone Encounter (Signed)
Spoke with patient. Patient states she had vulvar biopsy on 02/18/17 by Dr. Talbert Nan, was advised if sutures become bothersome, return call to schedule OV for removal. Patient states sutures are bothering her and would like them removed. Advised patient Dr. Talbert Nan is out of the office tomorrow, can schedule with covering provider. Patient scheduled for 03/12/17 at 9:15am with Dr. Quincy Simmonds. Patient is agreeable to date and time.   Routing to provider for final review. Patient is agreeable to disposition. Will close encounter.   Cc: Dr. Quincy Simmonds

## 2017-03-11 NOTE — Telephone Encounter (Signed)
Patient seen in office by Dr. Talbert Nan on 03/01/17 for f/u. Will close encounter.  Routing to provider for final review. Patient is agreeable to disposition. Will close encounter.  Cc: Dr. Talbert Nan

## 2017-03-11 NOTE — Telephone Encounter (Signed)
Patient states her stitches are bothering her and she wants to come in to see Dr. Talbert Nan.

## 2017-03-12 ENCOUNTER — Ambulatory Visit (INDEPENDENT_AMBULATORY_CARE_PROVIDER_SITE_OTHER): Payer: 59 | Admitting: Obstetrics and Gynecology

## 2017-03-12 ENCOUNTER — Encounter: Payer: Self-pay | Admitting: Obstetrics and Gynecology

## 2017-03-12 VITALS — BP 112/72 | HR 84 | Resp 16 | Wt 257.0 lb

## 2017-03-12 DIAGNOSIS — A630 Anogenital (venereal) warts: Secondary | ICD-10-CM

## 2017-03-12 NOTE — Patient Instructions (Signed)
Imiquimod skin cream - ALDARA What is this medicine? IMIQUIMOD (i mi KWI mod) cream is used to treat external genital or anal warts. It is also used to treat other skin conditions such as actinic keratosis and certain types of skin cancer. This medicine may be used for other purposes; ask your health care provider or pharmacist if you have questions. COMMON BRAND NAME(S): Tawni Levy What should I tell my health care provider before I take this medicine? They need to know if you have any of these conditions: -decreased immune function -an unusual or allergic reaction to imiquimod, other medicines, foods, dyes, or preservatives -pregnant or trying to get pregnant -breast-feeding How should I use this medicine? This medicine is for external use only. Do not take by mouth. Follow the directions on the prescription label. Apply just before bedtime. Wash your hands before and after use. Apply a thin layer of cream and massage gently into the affected areas until no longer visible. Do not use in the mouth, eyes or the vagina. Use this medicine only on the affected area as directed by your health care provider. Do not use for longer than prescribed. It is important not to use more medicine than prescribed. To do so may increase the chance of side effects. Talk to your pediatrician regarding the use of this medicine in children. While this drug may be prescribed for children as young as 32 years of age for selected conditions, precautions do apply. Overdosage: If you think you have taken too much of this medicine contact a poison control center or emergency room at once. NOTE: This medicine is only for you. Do not share this medicine with others. What if I miss a dose? If you miss a dose, use it as soon as you can. If it is almost time for your next dose, use only that dose. Do not use double or extra doses. What may interact with this medicine? Interactions are not expected. Do not use any other  medicines on the treated area without asking your doctor or health care professional. This list may not describe all possible interactions. Give your health care provider a list of all the medicines, herbs, non-prescription drugs, or dietary supplements you use. Also tell them if you smoke, drink alcohol, or use illegal drugs. Some items may interact with your medicine. What should I watch for while using this medicine? Visit your health care professional for regular checks on your progress. Do not use this medicine until the skin has healed from any other drug (example: podofilox or podophyllin resin) or surgical skin treatment. Females should receive regular pelvic exams while being treated for genital warts. Most patients see improvement within 4 weeks. It may take up to 16 weeks to see a full clearing of the warts. This medicine is not a cure. New warts may develop during or after treatment. Avoid sexual (genital, anal, oral) contact while the cream is on the skin. If warts are visible in the genital area, sexual contact should be avoided until the warts are treated. The use of latex condoms during sexual contact may reduce, but not entirely prevent, infecting others. This medicine may weaken condoms, diaphragms, cervical caps or other barrier devices and make them less effective as birth control. Do not cover the treated area with an airtight bandage. Cotton gauze dressings can be used. Cotton underwear can be worn after using this medicine on the genital or anal area. Actinic keratoses that were not seen before may appear during  treatment and may later go away. The treatment area and surrounding area may lighten or darken after treatment with this medicine. These skin color changes may be permanent in some patients. If you experience a skin reaction at the treatment site that interferes or prevents you from doing any daily activity, contact your health care provider. You may need a rest period from  treatment. Treatment may be restarted once the reaction has gotten better as recommended by your doctor or health care professional. This medicine can make you more sensitive to the sun. Keep out of the sun. If you cannot avoid being in the sun, wear protective clothing and use sunscreen. Do not use sun lamps or tanning beds/booths. What side effects may I notice from receiving this medicine? Side effects that you should report to your doctor or health care professional as soon as possible: -open sores with or without drainage -skin infection -skin rash -unusual or severe skin reaction Side effects that usually do not require medical attention (report to your doctor or health care professional if they continue or are bothersome): -burning or itching -redness of the skin (very common but is usually not painful or harmful) -scabbing, crusting, or peeling skin -skin that becomes hard or thickened -swelling of the skin This list may not describe all possible side effects. Call your doctor for medical advice about side effects. You may report side effects to FDA at 1-800-FDA-1088. Where should I keep my medicine? Keep out of the reach of children. Store between 4 and 25 degrees C (39 and 77 degrees F). Do not freeze. Throw away any unused medicine after the expiration date. Discard packet after applying to affected area. Partial packets should not be saved or reused. NOTE: This sheet is a summary. It may not cover all possible information. If you have questions about this medicine, talk to your doctor, pharmacist, or health care provider.  2018 Elsevier/Gold Standard (2008-08-14 10:33:25)

## 2017-03-12 NOTE — Progress Notes (Signed)
GYNECOLOGY  VISIT   HPI: 41 y.o.   Married  Caucasian  female   [phone] with No LMP recorded. Patient is not currently having periods (Reason: IUD).   here for suture removal   Had a vulvar biopsy with Dr. Talbert Nan on 02/18/17.  Biopsies showed condyloma.   Sutures are starting to irritate her.   Lots of questions about HPV.  GYNECOLOGIC HISTORY: No LMP recorded. Patient is not currently having periods (Reason: IUD). Contraception: Mirena  IUD  Menopausal hormone therapy:  none Last mammogram:  01/02/16 BIRADS 1 negative/density b Last pap smear:   12/12/15 Pap and HR HPV negative        OB History    Gravida Para Term Preterm AB Living   3 3 3     3    SAB TAB Ectopic Multiple Live Births           3         Patient Active Problem List   Diagnosis Date Noted  . Residual foreign body in soft tissue 11/12/2016  . Prediabetes 11/02/2016  . Morbid obesity ([place]) 10/15/2016  . Bipolar 1 disorder ([place])     Past Medical History:  Diagnosis Date  . Allergy    Zyrtec, Allegra.  . Anxiety    followed by Dr. Toy Cookey  . Back pain   . Bipolar 1 disorder ([place])   . Complication of anesthesia   . Depression   . Fibroid   . Gallbladder problem   . Gastroparesis 09/14/2012   gastric emptying study in 2014  . GERD (gastroesophageal reflux disease)   . Pneumonia    2013ish  . PONV (postoperative nausea and vomiting)   . Pre-diabetes   . PTSD (post-traumatic stress disorder)   . Vitamin D deficiency     Past Surgical History:  Procedure Laterality Date  . CESAREAN SECTION    . CHOLECYSTECTOMY    . DILATION AND CURETTAGE OF UTERUS    . FOREIGN BODY REMOVAL Left 11/18/2016   Procedure: REMOVAL FOREIGN BODY EXTREMITY LEFT FOOT;  Surgeon: Trula Slade, DPM;  Location: [facility];  Service: Podiatry;  Laterality: Left;  . PILONIDAL CYST EXCISION  1990's  . TENDON REPAIR Left    Left Ankle  . WISDOM TOOTH EXTRACTION  19090's    Current Outpatient Prescriptions  Medication Sig  Dispense Refill  . cetirizine (ZYRTEC) 10 MG tablet Take 10 mg by mouth at bedtime.    . Cholecalciferol (VITAMIN D3) 5000 units CAPS Take 5,000 Units by mouth at bedtime.    . clonazePAM (KLONOPIN) 1 MG tablet Take 1 mg by mouth 2 (two) times daily as needed for anxiety.    [name] ibuprofen (ADVIL,MOTRIN) 200 MG tablet Take 400-600 mg by mouth daily as needed for headache or moderate pain.    . Insulin Pen Needle 31G X 6 MM MISC 1 Package by Does not apply route every morning. 30 each 0  . L-methylfolate Calcium 15 MG TABS Take 15 m by mouth daily.    [name] lamoTRIgine (LAMICTAL) 200 MG tablet Take 200 mg by mouth at bedtime.    [name] levonorgestrel (MIRENA) 20 MCG/24HR IUD 1 each by Intrauterine route once.    . loratadine (CLARITIN) 10 MG tablet Take 10 mg by mouth at bedtime.     . Multiple Vitamin (MULTIVITAMIN) tablet Take 1 tablet by mouth Nightly.    . promethazine (PHENERGAN) 25 MG tablet Take 1 tablet (25 mg total) by mouth every 8 (eight) hours  as needed for nausea or vomiting. 20 tablet 0  . propranolol (INDERAL) 40 MG tablet Take 40 mg by mouth 2 (two) times daily as needed (anxiety).     . QUEtiapine (SEROQUEL) 400 MG tablet Take 800 mg by mouth at bedtime.     . ranitidine (ZANTAC) 150 MG tablet Take 150 mg by mouth at bedtime.    . triamcinolone ointment (KENALOG) 0.1 % Apply 1 application topically daily as needed (rash).   3   No current facility-administered medications for this visit.      ALLERGIES: Tamiflu [oseltamivir phosphate]; Cephalosporins; Latex; and Penicillins  Family History  Problem Relation Age of Onset  . Cancer Mother        squamous cell carcinoma  . Hyperlipidemia Mother   . Depression Mother   . Anxiety disorder Mother   . Obesity Mother   . Heart disease Father        cardiomegaly, CHF  . Hyperlipidemia Father   . Hypertension Father   . Mental retardation Father   . High blood pressure Father   . Depression Father   . Anxiety disorder Father   .  Obesity Father   . Cancer Maternal Grandmother   . Diabetes Maternal Grandmother   . Heart disease Maternal Grandmother   . Hyperlipidemia Maternal Grandmother   . Hypertension Maternal Grandmother   . Stroke Maternal Grandmother   . Heart disease Paternal Grandmother   . Hypertension Paternal Grandmother   . Heart disease Paternal Grandfather   . Hyperlipidemia Paternal Grandfather   . Mental illness Paternal Grandfather   . Breast cancer Maternal Aunt   . Thyroid cancer Paternal Aunt     Social History   Social History  . Marital status: Married    Spouse name: N/A  . Number of children: N/A  . Years of education: N/A   Occupational History  . RN [employer]   Social History Main Topics  . Smoking status: Former Smoker    Years: 10.00    Types: Cigarettes    Quit date: 2003  . Smokeless tobacco: Never Used  . Alcohol use 0.0 oz/week     Comment: 1-2 per month   . Drug use: No  . Sexual activity: Yes    Partners: Male    Birth control/ protection: IUD   Other Topics Concern  . 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      Seatbelt: 100%; no texting while driving often        ROS:  Pertinent items are noted in HPI.  PHYSICAL EXAMINATION:    BP 112/72 (BP Location: Right Arm, Patient Position: Sitting, Cuff Size: Large)   Pulse 84   Resp 16   Wt 257 lb (116.6 kg)   BMI 42.77 kg/m     General appearance: alert, cooperative and appears stated age    Pelvic: External genitalia:   Sutures present left perineum and left perianal region.  All sutures removed.  Left perineum with 3 mm slightly raised light brown  area consistent with  small condyloma.                Chaperone was present for exam.  ASSESSMENT  Condyloma.  Healing well from biopsies.   PLAN  We discussed HPV, condyloma.  I offered Aldara cream to her and gave her written information.  She declines this for now.  I did not encourage her to have removal of the small area noted today but recommended she return if it increases in size or changes texture.    An After Visit Summary was printed and given to the patient.  ____15__ minutes face to face time of which over 50% was spent in counseling.

## 2017-03-13 DIAGNOSIS — F411 Generalized anxiety disorder: Secondary | ICD-10-CM | POA: Diagnosis not present

## 2017-03-22 ENCOUNTER — Encounter (INDEPENDENT_AMBULATORY_CARE_PROVIDER_SITE_OTHER): Payer: Self-pay

## 2017-03-22 ENCOUNTER — Other Ambulatory Visit (INDEPENDENT_AMBULATORY_CARE_PROVIDER_SITE_OTHER): Payer: Self-pay | Admitting: Family Medicine

## 2017-03-22 MED FILL — SAXENDA 18 MG/3 ML PEN: 18 | 30 days supply | Qty: 15 | Fill #2

## 2017-03-23 MED FILL — QUETIAPINE FUMARATE 400 MG: 400 | 90 days supply | Qty: 180 | Fill #0

## 2017-03-29 ENCOUNTER — Ambulatory Visit (INDEPENDENT_AMBULATORY_CARE_PROVIDER_SITE_OTHER): Payer: 59 | Admitting: Family Medicine

## 2017-03-29 VITALS — BP 110/73 | HR 80 | Temp 97.5°F | Ht 65.0 in | Wt 255.0 lb

## 2017-03-29 DIAGNOSIS — E8881 Metabolic syndrome: Secondary | ICD-10-CM

## 2017-03-29 DIAGNOSIS — Z6841 Body Mass Index (BMI) 40.0 and over, adult: Secondary | ICD-10-CM | POA: Diagnosis not present

## 2017-03-29 DIAGNOSIS — IMO0001 Reserved for inherently not codable concepts without codable children: Secondary | ICD-10-CM

## 2017-03-29 DIAGNOSIS — E669 Obesity, unspecified: Secondary | ICD-10-CM

## 2017-03-29 DIAGNOSIS — Z9189 Other specified personal risk factors, not elsewhere classified: Secondary | ICD-10-CM

## 2017-03-29 MED ORDER — LIRAGLUTIDE -WEIGHT MANAGEMENT 18 MG/3ML ~~LOC~~ SOPN: 3.0000 mg | PEN_INJECTOR | Freq: Every day | SUBCUTANEOUS | 0 refills | Status: DC

## 2017-03-29 MED ORDER — INSULIN PEN NEEDLE 31G X 6 MM MISC: 1.0000 | Freq: Every morning | 0 refills | Status: DC

## 2017-03-29 MED FILL — UNIFINE PENTIPS 31GX3/16": 31G X 5 MM | 90 days supply | Qty: 100 | Fill #0

## 2017-03-29 MED FILL — UNIFINE PENTIPS 31GX3/16: 31G X 5 MM | 90 days supply | Qty: 100 | Fill #0

## 2017-03-29 NOTE — Progress Notes (Signed)
Office: [phone]  /  Fax: [phone]   HPI:   Chief Complaint: OBESITY Caroline Gonzales is here to discuss her progress with her obesity treatment plan. She is on the  follow the Category 3 plan and is following her eating plan approximately 0 % of the time. She states she is exercising 0 minutes 0 times per week. Caroline Gonzales continues to do well with weight loss but has deviated more from the plan. She has worked on increasing protein and bringing her lunch to work. She isn't sleeping as well. She had a increase in her seroquel and has increased fatigue and decreased motivation. She is on saxenda but has missed some doses recently. Her weight is 255 lb (115.7 kg) today and has had a weight loss of 2 pounds over a period of 4 weeks since her last visit. She has lost 23 lbs since starting treatment with [name].  Insulin Resistance Caroline Gonzales has a diagnosis of insulin resistance, her last A1c was at 5.3 but her elevated fasting insulin level was >5. Although Caroline Gonzales's blood glucose readings are still under good control, insulin resistance puts her at greater risk of metabolic syndrome and diabetes. Simple carbohydrates are starting to increase in the last month. She is controlling with diet and saxenda and she continues to work on diet and exercise to decrease risk of diabetes. Polyphagia is controlled and she denies nausea or vomiting.    At risk for diabetes Caroline Gonzales is at higher than average risk for developing diabetes due to her obesity and insulin resistance. She currently denies polyuria or polydipsia.  ALLERGIES: Allergies  Allergen Reactions  . Tamiflu [Oseltamivir Phosphate] Anaphylaxis  . Cephalosporins Rash  . Latex Rash  . Penicillins Rash    MEDICATIONS: Current Outpatient Prescriptions on File Prior to Visit  Medication Sig Dispense Refill  . cetirizine (ZYRTEC) 10 MG tablet Take 10 mg by mouth at bedtime.    . Cholecalciferol (VITAMIN D3) 5000 units CAPS Take 5,000 Units by mouth at  bedtime.    . clonazePAM (KLONOPIN) 1 MG tablet Take 1 mg by mouth 2 (two) times daily as needed for anxiety.    [name] ibuprofen (ADVIL,MOTRIN) 200 MG tablet Take 400-600 mg by mouth daily as needed for headache or moderate pain.    . Insulin Pen Needle 31G X 6 MM MISC 1 Package by Does not apply route every morning. 30 each 0  . L-methylfolate Calcium 15 MG TABS Take 15 m by mouth daily.    [name] lamoTRIgine (LAMICTAL) 200 MG tablet Take 200 mg by mouth at bedtime.    [name] levonorgestrel (MIRENA) 20 MCG/24HR IUD 1 each by Intrauterine route once.    . loratadine (CLARITIN) 10 MG tablet Take 10 mg by mouth at bedtime.     . Multiple Vitamin (MULTIVITAMIN) tablet Take 1 tablet by mouth Nightly.    . propranolol (INDERAL) 40 MG tablet Take 40 mg by mouth 2 (two) times daily.     . QUEtiapine (SEROQUEL) 400 MG tablet Take 800 mg by mouth at bedtime.     . ranitidine (ZANTAC) 150 MG tablet Take 150 mg by mouth at bedtime.    . triamcinolone ointment (KENALOG) 0.1 % Apply 1 application topically daily as needed (rash).   3   No current facility-administered medications on file prior to visit.     PAST MEDICAL HISTORY: Past Medical History:  Diagnosis Date  . Allergy    Zyrtec, Allegra.  . Anxiety    followed by Dr. Toy Cookey  .  Back pain   . Bipolar 1 disorder ([place])   . Complication of anesthesia   . Depression   . Fibroid   . Gallbladder problem   . Gastroparesis 09/14/2012   gastric emptying study in 2014  . GERD (gastroesophageal reflux disease)   . Pneumonia    2013ish  . PONV (postoperative nausea and vomiting)   . Pre-diabetes   . PTSD (post-traumatic stress disorder)   . Vitamin D deficiency     PAST SURGICAL HISTORY: Past Surgical History:  Procedure Laterality Date  . CESAREAN SECTION    . CHOLECYSTECTOMY    . DILATION AND CURETTAGE OF UTERUS    . FOREIGN BODY REMOVAL Left 11/18/2016   Procedure: REMOVAL FOREIGN BODY EXTREMITY LEFT FOOT;  Surgeon: Trula Slade, DPM;  Location:  [facility];  Service: Podiatry;  Laterality: Left;  . PILONIDAL CYST EXCISION  1990's  . TENDON REPAIR Left    Left Ankle  . WISDOM TOOTH EXTRACTION  19090's    SOCIAL HISTORY: Social History  Substance Use Topics  . Smoking status: Former Smoker    Years: 10.00    Types: Cigarettes    Quit date: 2003  . Smokeless tobacco: Never Used  . Alcohol use 0.0 oz/week     Comment: 1-2 per month     FAMILY HISTORY: Family History  Problem Relation Age of Onset  . Cancer Mother        squamous cell carcinoma  . Hyperlipidemia Mother   . Depression Mother   . Anxiety disorder Mother   . Obesity Mother   . Heart disease Father        cardiomegaly, CHF  . Hyperlipidemia Father   . Hypertension Father   . Mental retardation Father   . High blood pressure Father   . Depression Father   . Anxiety disorder Father   . Obesity Father   . Cancer Maternal Grandmother   . Diabetes Maternal Grandmother   . Heart disease Maternal Grandmother   . Hyperlipidemia Maternal Grandmother   . Hypertension Maternal Grandmother   . Stroke Maternal Grandmother   . Heart disease Paternal Grandmother   . Hypertension Paternal Grandmother   . Heart disease Paternal Grandfather   . Hyperlipidemia Paternal Grandfather   . Mental illness Paternal Grandfather   . Breast cancer Maternal Aunt   . Thyroid cancer Paternal Aunt     ROS: Review of Systems  Constitutional: Positive for weight loss.  Gastrointestinal: Negative for nausea and vomiting.  Genitourinary: Negative for frequency.  Endo/Heme/Allergies: Negative for polydipsia.       Polyphagia    PHYSICAL EXAM: Blood pressure 110/73, pulse 80, temperature (!) 97.5 F (36.4 C), temperature source Oral, height 5\' 5"  (1.651 m), weight 255 lb (115.7 kg), SpO2 98 %. Body mass index is 42.43 kg/m. Physical Exam  Constitutional: She is oriented to person, place, and time. She appears well-developed and well-nourished.  Cardiovascular: Normal rate.     Pulmonary/Chest: Effort normal.  Musculoskeletal: Normal range of motion.  Neurological: She is oriented to person, place, and time.  Skin: Skin is warm and dry.  Psychiatric: She has a normal mood and affect. Her behavior is normal.  Vitals reviewed.   RECENT LABS AND TESTS: BMET    Component Value Date/Time   NA 138 01/11/2017 1313   K 4.3 01/11/2017 1313   CL 99 01/11/2017 1313   CO2 24 01/11/2017 1313   GLUCOSE 82 01/11/2017 1313   GLUCOSE 87 06/27/2016 0933  BUN 13 01/11/2017 1313   CREATININE 0.84 01/11/2017 1313   CREATININE 0.95 06/27/2016 0933   CALCIUM 9.1 01/11/2017 1313   GFRNONAA 87 01/11/2017 1313   GFRAA 100 01/11/2017 1313   Lab Results  Component Value Date   HGBA1C 5.3 01/11/2017   HGBA1C 5.7 (H) 08/31/2016   HGBA1C 5.6 12/01/2015   Lab Results  Component Value Date   INSULIN 17.0 01/11/2017   INSULIN 17.4 08/31/2016   CBC    Component Value Date/Time   WBC 7.9 11/12/2016 1651   WBC 8.1 06/27/2016 0941   WBC 9.9 04/30/2016 0947   RBC 4.36 11/12/2016 1651   RBC 3.96 (A) 06/27/2016 0941   RBC 4.15 04/30/2016 0947   HGB 11.9 11/12/2016 1651   HCT 36.5 11/12/2016 1651   PLT 332 11/12/2016 1651   MCV 84 11/12/2016 1651   MCH 27.3 11/12/2016 1651   MCH 29.0 06/27/2016 0941   MCH 28.2 04/30/2016 0947   MCHC 32.6 11/12/2016 1651   MCHC 34.5 06/27/2016 0941   MCHC 33.2 04/30/2016 0947   RDW 15.5 (H) 11/12/2016 1651   LYMPHSABS 2.2 11/12/2016 1651   MONOABS 891 04/30/2016 0947   EOSABS 0.1 11/12/2016 1651   BASOSABS 0.0 11/12/2016 1651   Iron/TIBC/Ferritin/ %Sat    Component Value Date/Time   IRON 53 08/31/2016 1644   TIBC 280 08/31/2016 1644   FERRITIN 36 08/31/2016 1644   IRONPCTSAT 19 08/31/2016 1644   Lipid Panel     Component Value Date/Time   CHOL 148 01/11/2017 1313   TRIG 133 01/11/2017 1313   HDL 39 (L) 01/11/2017 1313   CHOLHDL 4.1 12/01/2015 0914   VLDL 37 (H) 12/01/2015 0914   LDLCALC 82 01/11/2017 1313   Hepatic  Function Panel     Component Value Date/Time   PROT 7.1 01/11/2017 1313   ALBUMIN 3.8 01/11/2017 1313   AST 17 01/11/2017 1313   ALT 15 01/11/2017 1313   ALKPHOS 99 01/11/2017 1313   BILITOT <0.2 01/11/2017 1313      Component Value Date/Time   TSH 1.620 08/31/2016 1644   TSH 1.83 12/01/2015 0914    ASSESSMENT AND PLAN: Insulin resistance  At risk for diabetes mellitus  Class 3 obesity with serious comorbidity and body mass index (BMI) of 40.0 to 44.9 in adult, unspecified obesity type ([place]) - Plan: Liraglutide -Weight Management (SAXENDA) 18 MG/3ML SOPN, Insulin Pen Needle 31G X 6 MM MISC  PLAN:  Insulin Resistance Caroline Gonzales will continue to work on weight loss, exercise, and decreasing simple carbohydrates in her diet to help decrease the risk of diabetes. We dicussed metformin including benefits and risks. She was informed that eating too many simple carbohydrates or too many calories at one sitting increases the likelihood of GI side effects. Caroline Gonzales agrees to continue saxenda and prescription was not written today. Caroline Gonzales agreed to follow up with [name] as directed to monitor her progress. We will re-check labs at next visit.  Diabetes risk counselling Caroline Gonzales was given extended (at least 15 minutes) diabetes prevention counseling today. She is 41 y.o. female and has risk factors for diabetes including obesity and insulin resistance. We discussed intensive lifestyle modifications today with an emphasis on weight loss as well as increasing exercise and decreasing simple carbohydrates in her diet.  Obesity Caroline Gonzales is currently in the action stage of change. As such, her goal is to continue with weight loss efforts She has agreed to follow the Category 3 plan Caroline Gonzales has been instructed to work  up to a goal of 150 minutes of combined cardio and strengthening exercise per week for weight loss and overall health benefits. We discussed the following Behavioral Modification Strategies  today: increasing lean protein intake, decreasing simple carbohydrates  and dealing with family or coworker sabotage We discussed various medication options to help Zamirah with her weight loss efforts and we both agreed to continue saxenda at 1.2 mg but refill medication at 3.0 mg qd #5 pens with no refill and nano needles #100.  Anushree has agreed to follow up with our clinic in 2 weeks. She was informed of the importance of frequent follow up visits to maximize her success with intensive lifestyle modifications for her multiple health conditions.  I, Doreene Nest, am acting as transcriptionist for Dennard Nip, MD  I have reviewed the above documentation for accuracy and completeness, and I agree with the above. -Dennard Nip, MD  OBESITY BEHAVIORAL INTERVENTION VISIT  Today's visit was # 13 out of 22.  Starting weight: 278 lbs Starting date: 08/31/16 Today's weight : 255 lbs  Today's date: 03/29/2017 Total lbs lost to date: 23 (Patients must lose 7 lbs in the first 6 months to continue with counseling)   ASK: We discussed the diagnosis of obesity with Marjie Skiff Spiering today and Ronasia agreed to give [name] permission to discuss obesity behavioral modification therapy today.  ASSESS: Jenilee has the diagnosis of obesity and her BMI today is @42 .5 Threasa is in the action stage of change   ADVISE: Kyesha was educated on the multiple health risks of obesity as well as the benefit of weight loss to improve her health. She was advised of the need for long term treatment and the importance of lifestyle modifications.  AGREE: Multiple dietary modification options and treatment options were discussed and  Serina agreed to follow the Category 3 plan We discussed the following Behavioral Modification Strategies today: increasing lean protein intake, decreasing simple carbohydrates  and dealing with family or coworker sabotage

## 2017-03-30 ENCOUNTER — Encounter (INDEPENDENT_AMBULATORY_CARE_PROVIDER_SITE_OTHER): Payer: Self-pay | Admitting: Family Medicine

## 2017-04-01 DIAGNOSIS — F4310 Post-traumatic stress disorder, unspecified: Secondary | ICD-10-CM | POA: Diagnosis not present

## 2017-04-02 MED FILL — lamoTRIgine 200 MG TABS: 200 | 90 days supply | Qty: 90 | Fill #0

## 2017-04-02 MED FILL — PROPRANOLOL 40 MG TABLET: 40 | 90 days supply | Qty: 180 | Fill #0

## 2017-04-02 MED FILL — clonazePAM 0.5 MG TABS: 0.5 | 30 days supply | Qty: 120 | Fill #0

## 2017-04-10 DIAGNOSIS — F411 Generalized anxiety disorder: Secondary | ICD-10-CM | POA: Diagnosis not present

## 2017-04-13 ENCOUNTER — Ambulatory Visit (INDEPENDENT_AMBULATORY_CARE_PROVIDER_SITE_OTHER): Payer: 59 | Admitting: Family Medicine

## 2017-04-13 VITALS — BP 99/67 | HR 95 | Temp 97.8°F | Ht 65.0 in | Wt 257.0 lb

## 2017-04-13 DIAGNOSIS — E669 Obesity, unspecified: Secondary | ICD-10-CM | POA: Diagnosis not present

## 2017-04-13 DIAGNOSIS — Z6841 Body Mass Index (BMI) 40.0 and over, adult: Secondary | ICD-10-CM

## 2017-04-13 DIAGNOSIS — IMO0001 Reserved for inherently not codable concepts without codable children: Secondary | ICD-10-CM

## 2017-04-13 DIAGNOSIS — E559 Vitamin D deficiency, unspecified: Secondary | ICD-10-CM | POA: Diagnosis not present

## 2017-04-13 NOTE — Progress Notes (Signed)
Office: [phone]  /  Fax: [phone]   HPI:   Chief Complaint: OBESITY Caroline Gonzales is here to discuss her progress with her obesity treatment plan. She is on the  follow the Category 3 plan and is following her eating plan approximately 25 % of the time. She states she is exercising 0 minutes 0 times per week. Caroline Gonzales notes increased sabotage at home from her husband but states he is now on board with trying to eat healthy. She is bored with her dinner options and would like more choices. Caroline Gonzales is currently on Saxenda 1.2 mg daily Her weight is 257 lb (116.6 kg) today and has had a weight gain of 2 pounds over a period of 2 weeks since her last visit. She has lost 21 lbs since starting treatment with [name].  Vitamin D deficiency Caroline Gonzales has a diagnosis of vitamin D deficiency. She is currently on OTC vit D 5,000 IU daily and last level was at goal and she is now maintaining her vit D level. Caroline Gonzales denies nausea, vomiting or muscle weakness.   ALLERGIES: Allergies  Allergen Reactions  . Tamiflu [Oseltamivir Phosphate] Anaphylaxis  . Cephalosporins Rash  . Latex Rash  . Penicillins Rash    MEDICATIONS: Current Outpatient Prescriptions on File Prior to Visit  Medication Sig Dispense Refill  . Cholecalciferol (VITAMIN D3) 5000 units CAPS Take 5,000 Units by mouth at bedtime.    . clonazePAM (KLONOPIN) 1 MG tablet Take 1 mg by mouth 2 (two) times daily as needed for anxiety. Taking 0.5mg  1-2 tabs BID prn    . Insulin Pen Needle 31G X 6 MM MISC 1 Package by Does not apply route every morning. 100 each 0  . L-methylfolate Calcium 15 MG TABS Take 15 m by mouth daily.    [name] lamoTRIgine (LAMICTAL) 200 MG tablet Take 200 mg by mouth at bedtime.    [name] levonorgestrel (MIRENA) 20 MCG/24HR IUD 1 each by Intrauterine route once.    . Liraglutide -Weight Management (SAXENDA) 18 MG/3ML SOPN Inject 3 mg into the skin daily. (Patient taking differently: Inject 3 mg into the skin daily. 1.8mg  daily)  5 pen 0  . loratadine (CLARITIN) 10 MG tablet Take 10 mg by mouth at bedtime.     . Multiple Vitamin (MULTIVITAMIN) tablet Take 1 tablet by mouth Nightly.    . propranolol (INDERAL) 40 MG tablet Take 40 mg by mouth 2 (two) times daily.     . QUEtiapine (SEROQUEL) 400 MG tablet Take 800 mg by mouth at bedtime.     . ranitidine (ZANTAC) 150 MG tablet Take 150 mg by mouth at bedtime.    . triamcinolone ointment (KENALOG) 0.1 % Apply 1 application topically daily as needed (rash).   3  . cetirizine (ZYRTEC) 10 MG tablet Take 10 mg by mouth at bedtime.    [name] ibuprofen (ADVIL,MOTRIN) 200 MG tablet Take 400-600 mg by mouth daily as needed for headache or moderate pain.     No current facility-administered medications on file prior to visit.     PAST MEDICAL HISTORY: Past Medical History:  Diagnosis Date  . Allergy    Zyrtec, Allegra.  . Anxiety    followed by Dr. Toy Cookey  . Back pain   . Bipolar 1 disorder ([place])   . Complication of anesthesia   . Depression   . Fibroid   . Gallbladder problem   . Gastroparesis 09/14/2012   gastric emptying study in 2014  . GERD (gastroesophageal reflux disease)   .  Pneumonia    2013ish  . PONV (postoperative nausea and vomiting)   . Pre-diabetes   . PTSD (post-traumatic stress disorder)   . Vitamin D deficiency     PAST SURGICAL HISTORY: Past Surgical History:  Procedure Laterality Date  . CESAREAN SECTION    . CHOLECYSTECTOMY    . DILATION AND CURETTAGE OF UTERUS    . FOREIGN BODY REMOVAL Left 11/18/2016   Procedure: REMOVAL FOREIGN BODY EXTREMITY LEFT FOOT;  Surgeon: Trula Slade, DPM;  Location: [facility];  Service: Podiatry;  Laterality: Left;  . PILONIDAL CYST EXCISION  1990's  . TENDON REPAIR Left    Left Ankle  . WISDOM TOOTH EXTRACTION  19090's    SOCIAL HISTORY: Social History  Substance Use Topics  . Smoking status: Former Smoker    Years: 10.00    Types: Cigarettes    Quit date: 2003  . Smokeless tobacco: Never Used  .  Alcohol use 0.0 oz/week     Comment: 1-2 per month     FAMILY HISTORY: Family History  Problem Relation Age of Onset  . Cancer Mother        squamous cell carcinoma  . Hyperlipidemia Mother   . Depression Mother   . Anxiety disorder Mother   . Obesity Mother   . Heart disease Father        cardiomegaly, CHF  . Hyperlipidemia Father   . Hypertension Father   . Mental retardation Father   . High blood pressure Father   . Depression Father   . Anxiety disorder Father   . Obesity Father   . Cancer Maternal Grandmother   . Diabetes Maternal Grandmother   . Heart disease Maternal Grandmother   . Hyperlipidemia Maternal Grandmother   . Hypertension Maternal Grandmother   . Stroke Maternal Grandmother   . Heart disease Paternal Grandmother   . Hypertension Paternal Grandmother   . Heart disease Paternal Grandfather   . Hyperlipidemia Paternal Grandfather   . Mental illness Paternal Grandfather   . Breast cancer Maternal Aunt   . Thyroid cancer Paternal Aunt     ROS: Review of Systems  Constitutional: Negative for weight loss.  Gastrointestinal: Negative for nausea and vomiting.  Musculoskeletal:       Negative muscle weakness    PHYSICAL EXAM: Blood pressure 99/67, pulse 95, temperature 97.8 F (36.6 C), temperature source Oral, height 5\' 5"  (1.651 m), weight 257 lb (116.6 kg), SpO2 98 %. Body mass index is 42.77 kg/m. Physical Exam  Constitutional: She is oriented to person, place, and time. She appears well-nourished.  Cardiovascular: Normal rate.   Pulmonary/Chest: Effort normal.  Musculoskeletal: Normal range of motion.  Neurological: She is oriented to person, place, and time.  Skin: Skin is warm and dry.  Psychiatric: She has a normal mood and affect. Her behavior is normal.  Vitals reviewed.   RECENT LABS AND TESTS: BMET    Component Value Date/Time   NA 138 01/11/2017 1313   K 4.3 01/11/2017 1313   CL 99 01/11/2017 1313   CO2 24 01/11/2017 1313    GLUCOSE 82 01/11/2017 1313   GLUCOSE 87 06/27/2016 0933   BUN 13 01/11/2017 1313   CREATININE 0.84 01/11/2017 1313   CREATININE 0.95 06/27/2016 0933   CALCIUM 9.1 01/11/2017 1313   GFRNONAA 87 01/11/2017 1313   GFRAA 100 01/11/2017 1313   Lab Results  Component Value Date   HGBA1C 5.3 01/11/2017   HGBA1C 5.7 (H) 08/31/2016   HGBA1C 5.6 12/01/2015  Lab Results  Component Value Date   INSULIN 17.0 01/11/2017   INSULIN 17.4 08/31/2016   CBC    Component Value Date/Time   WBC 7.9 11/12/2016 1651   WBC 8.1 06/27/2016 0941   WBC 9.9 04/30/2016 0947   RBC 4.36 11/12/2016 1651   RBC 3.96 (A) 06/27/2016 0941   RBC 4.15 04/30/2016 0947   HGB 11.9 11/12/2016 1651   HCT 36.5 11/12/2016 1651   PLT 332 11/12/2016 1651   MCV 84 11/12/2016 1651   MCH 27.3 11/12/2016 1651   MCH 29.0 06/27/2016 0941   MCH 28.2 04/30/2016 0947   MCHC 32.6 11/12/2016 1651   MCHC 34.5 06/27/2016 0941   MCHC 33.2 04/30/2016 0947   RDW 15.5 (H) 11/12/2016 1651   LYMPHSABS 2.2 11/12/2016 1651   MONOABS 891 04/30/2016 0947   EOSABS 0.1 11/12/2016 1651   BASOSABS 0.0 11/12/2016 1651   Iron/TIBC/Ferritin/ %Sat    Component Value Date/Time   IRON 53 08/31/2016 1644   TIBC 280 08/31/2016 1644   FERRITIN 36 08/31/2016 1644   IRONPCTSAT 19 08/31/2016 1644   Lipid Panel     Component Value Date/Time   CHOL 148 01/11/2017 1313   TRIG 133 01/11/2017 1313   HDL 39 (L) 01/11/2017 1313   CHOLHDL 4.1 12/01/2015 0914   VLDL 37 (H) 12/01/2015 0914   LDLCALC 82 01/11/2017 1313   Hepatic Function Panel     Component Value Date/Time   PROT 7.1 01/11/2017 1313   ALBUMIN 3.8 01/11/2017 1313   AST 17 01/11/2017 1313   ALT 15 01/11/2017 1313   ALKPHOS 99 01/11/2017 1313   BILITOT <0.2 01/11/2017 1313      Component Value Date/Time   TSH 1.620 08/31/2016 1644   TSH 1.83 12/01/2015 0914    ASSESSMENT AND PLAN: Vitamin D deficiency  Class 3 obesity with serious comorbidity and body mass index (BMI)  of 40.0 to 44.9 in adult, unspecified obesity type (HCC)  PLAN:  Vitamin D Deficiency Caroline Gonzales was informed that low vitamin D levels contributes to fatigue and are associated with obesity, breast, and colon cancer. She agrees to continue to take OTC Vit D @5 ,000 IU daily and we will re-check labs in 1 month and will follow up for routine testing of vitamin D, at least 2-3 times per year. She was informed of the risk of over-replacement of vitamin D and agrees to not increase her dose unless he discusses this with [name] first. Caroline Gonzales agrees to follow up with our clinic in 2 weeks.  Obesity Caroline Gonzales is currently in the action stage of change. As such, her goal is to continue with weight loss efforts She has agreed to keep a food journal with 400 to 600 calories and 35 grams of protein at supper daily and follow the Category 3 plan Caroline Gonzales has been instructed to work up to a goal of 150 minutes of combined cardio and strengthening exercise per week for weight loss and overall health benefits. We discussed the following Behavioral Modification Strategies today: meal planning & cooking strategies, planning for success, dealing with family or coworker sabotage and avoiding temptations  We discussed various medication options to help Caroline Gonzales with her weight loss efforts and we both agreed to increase Saxenda to 1.8 mg and continue with diet.  Caroline Gonzales has agreed to follow up with our clinic in 2 weeks. She was informed of the importance of frequent follow up visits to maximize her success with intensive lifestyle modifications for her multiple health  conditions.  I, Caroline Gonzales, am acting as transcriptionist for Caroline Nip, MD  I have reviewed the above documentation for accuracy and completeness, and I agree with the above. -Caroline Nip, MD  OBESITY BEHAVIORAL INTERVENTION VISIT  Today's visit was # 14 out of 22.  Starting weight: 278 lbs Starting date: 08/31/16 Today's weight : @257   lbs Today's date: 04/13/2017 Total lbs lost to date: 21 (Patients must lose 7 lbs in the first 6 months to continue with counseling)   ASK: We discussed the diagnosis of obesity with Caroline Gonzales today and Caroline Gonzales agreed to give [name] permission to discuss obesity behavioral modification therapy today.  ASSESS: Caroline Gonzales has the diagnosis of obesity and her BMI today is 38.9 Caroline Gonzales is in the action stage of change   ADVISE: Caroline Gonzales was educated on the multiple health risks of obesity as well as the benefit of weight loss to improve her health. She was advised of the need for long term treatment and the importance of lifestyle modifications.  AGREE: Multiple dietary modification options and treatment options were discussed and  Caroline Gonzales agreed to keep a food journal with 400 to 600 calories and 35 grams of protein  and follow the Category 3 plan We discussed the following Behavioral Modification Strategies today: meal planning & cooking strategies, planning for success, dealing with family or coworker sabotage and avoiding temptations

## 2017-04-14 ENCOUNTER — Ambulatory Visit (INDEPENDENT_AMBULATORY_CARE_PROVIDER_SITE_OTHER): Payer: 59 | Admitting: Physician Assistant

## 2017-04-14 ENCOUNTER — Encounter: Payer: Self-pay | Admitting: Physician Assistant

## 2017-04-14 VITALS — BP 124/81 | HR 88 | Temp 98.1°F | Resp 18 | Ht 65.0 in | Wt 262.2 lb

## 2017-04-14 DIAGNOSIS — J018 Other acute sinusitis: Secondary | ICD-10-CM

## 2017-04-14 MED ORDER — AZELASTINE HCL 0.1 % NA SOLN: 2.0000 | Freq: Two times a day (BID) | NASAL | 0 refills | Status: DC

## 2017-04-14 MED ORDER — AZITHROMYCIN 250 MG PO TABS: ORAL_TABLET | ORAL | 0 refills | Status: DC

## 2017-04-14 MED ORDER — FLUCONAZOLE 150 MG PO TABS: 150.0000 mg | ORAL_TABLET | Freq: Once | ORAL | 0 refills | Status: AC

## 2017-04-14 MED ORDER — GUAIFENESIN ER 1200 MG PO TB12: 1.0000 | ORAL_TABLET | Freq: Two times a day (BID) | ORAL | 1 refills | Status: DC | PRN

## 2017-04-14 MED ORDER — DOXYCYCLINE HYCLATE 100 MG PO CAPS: 100.0000 mg | ORAL_CAPSULE | Freq: Two times a day (BID) | ORAL | 0 refills | Status: AC

## 2017-04-14 MED FILL — AZELASTINE HCL 137 MCG SPRY: 0.1 | 30 days supply | Qty: 30 | Fill #0

## 2017-04-14 MED FILL — FLUCONAZOLE 150 MG TABLET: 150 | 2 days supply | Qty: 2 | Fill #0

## 2017-04-14 NOTE — Patient Instructions (Addendum)
Get plenty of rest and drink at least 64 ounces of water daily.    IF you received an x-ray today, you will receive an invoice from [facility]. Please contact [facility] at [phone] with questions or concerns regarding your invoice.   IF you received labwork today, you will receive an invoice from [facility]. Please contact [facility] at [phone] with questions or concerns regarding your invoice.   Our billing staff will not be able to assist you with questions regarding bills from these companies.  You will be contacted with the lab results as soon as they are available. The fastest way to get your results is to activate your My Chart account. Instructions are located on the last page of this paperwork. If you have not heard from us regarding the results in 2 weeks, please contact this office.      

## 2017-04-14 NOTE — Progress Notes (Signed)
Patient ID: Caroline Gonzales, female    DOB: 1975/10/12, 41 y.o.   MRN: [id number]  PCP: Wardell Honour, MD  Chief Complaint  Patient presents with  . Sinus Problem    x2 weeks, per pt had a cold a few weeks ago but is still having some pressure in her sinuses and "foul" smelling mucous.    Subjective:   Presents for evaluation of possible sinusitis.  Developed a cold 6-8 weeks ago. Just not resolving. Facial pressure and pain. Lots of drainage, thick, dark colored, malodorous. Saline NS q 3 hours.  No cough. No fever, chills. No ear pain or sore throat. No nausea, vomiting or diarrhea. No HA.   Review of Systems As above.    Patient Active Problem List   Diagnosis Date Noted  . Residual foreign body in soft tissue 11/12/2016  . Prediabetes 11/02/2016  . Morbid obesity ([place]) 10/15/2016  . Bipolar 1 disorder ([place])      Prior to Admission medications   Medication Sig Start Date End Date Taking? Authorizing Provider  cetirizine (ZYRTEC) 10 MG tablet Take 10 mg by mouth at bedtime.   Yes [provider]  Cholecalciferol (VITAMIN D3) 5000 units CAPS Take 5,000 Units by mouth at bedtime.   Yes [provider]  clonazePAM (KLONOPIN) 1 MG tablet Take 1 mg by mouth 2 (two) times daily as needed for anxiety. Taking 0.5mg  1-2 tabs BID prn   Yes [provider]  ibuprofen (ADVIL,MOTRIN) 200 MG tablet Take 400-600 mg by mouth daily as needed for headache or moderate pain.   Yes [provider]  Insulin Pen Needle 31G X 6 MM MISC 1 Package by Does not apply route every morning. 03/29/17  Yes Beasley, Caren D, MD  L-methylfolate Calcium 15 MG TABS Take 15 m by mouth daily.   Yes [provider]  lamoTRIgine (LAMICTAL) 200 MG tablet Take 200 mg by mouth at bedtime.   Yes [provider]  levonorgestrel (MIRENA) 20 MCG/24HR IUD 1 each by Intrauterine route once.   Yes [provider]  Liraglutide -Weight  Management (SAXENDA) 18 MG/3ML SOPN Inject 3 mg into the skin daily. Patient taking differently: Inject 3 mg into the skin daily. 1.8mg  daily 03/29/17  Yes Beasley, Caren D, MD  loratadine (CLARITIN) 10 MG tablet Take 10 mg by mouth at bedtime.    Yes [provider]  Multiple Vitamin (MULTIVITAMIN) tablet Take 1 tablet by mouth Nightly.   Yes [provider]  propranolol (INDERAL) 40 MG tablet Take 40 mg by mouth 2 (two) times daily.    Yes [provider]  QUEtiapine (SEROQUEL) 400 MG tablet Take 800 mg by mouth at bedtime.    Yes [provider]  ranitidine (ZANTAC) 150 MG tablet Take 150 mg by mouth at bedtime.   Yes [provider]  triamcinolone ointment (KENALOG) 0.1 % Apply 1 application topically daily as needed (rash).  09/02/16  Yes [provider]     Allergies  Allergen Reactions  . Tamiflu [Oseltamivir Phosphate] Anaphylaxis  . Cephalosporins Rash  . Latex Rash  . Penicillins Rash       Objective:  Physical Exam  Constitutional: She is oriented to person, place, and time. She appears well-developed and well-nourished. She is active. No distress.  BP 124/81 (BP Location: Right Arm, Patient Position: Sitting, Cuff Size: Large)   Pulse 88   Temp 98.1 F (36.7 C) (Oral)   Resp 18  Ht 5\' 5"  (1.651 m)   Wt 262 lb 3.2 oz (118.9 kg)   SpO2 96%   BMI 43.63 kg/m    HENT:  Head: Normocephalic and atraumatic.  Right Ear: Hearing, tympanic membrane, external ear and ear canal normal.  Left Ear: Hearing, tympanic membrane, external ear and ear canal normal.  Nose: Mucosal edema and rhinorrhea present.  No foreign bodies. Right sinus exhibits maxillary sinus tenderness and frontal sinus tenderness. Left sinus exhibits maxillary sinus tenderness and frontal sinus tenderness.  Mouth/Throat: Uvula is midline, oropharynx is clear and moist and mucous membranes are normal. No uvula swelling. No oropharyngeal exudate.  Eyes:  Pupils are equal, round, and reactive to light. Conjunctivae and EOM are normal. Right eye exhibits no discharge. Left eye exhibits no discharge. No scleral icterus.  Neck: Trachea normal, normal range of motion and full passive range of motion without pain. Neck supple. No thyroid mass and no thyromegaly present.  Cardiovascular: Normal rate, regular rhythm and normal heart sounds.   Pulmonary/Chest: Effort normal and breath sounds normal.  Lymphadenopathy:       Head (right side): No submandibular, no tonsillar, no preauricular, no posterior auricular and no occipital adenopathy present.       Head (left side): No submandibular, no tonsillar, no preauricular and no occipital adenopathy present.    She has no cervical adenopathy.       Right: No supraclavicular adenopathy present.       Left: No supraclavicular adenopathy present.  Neurological: She is alert and oriented to person, place, and time. She has normal strength. No cranial nerve deficit or sensory deficit.  Skin: Skin is warm, dry and intact. No rash noted.  Psychiatric: She has a normal mood and affect. Her speech is normal and behavior is normal.           Assessment & Plan:  1. Acute non-recurrent sinusitis of other sinus PCN and cephalosporin allergic. Macrolides and quinolones not recommended due to potential for QT prolongation when given with quetiapine. Therefore, Doxycycline. Avoid oral prednisone due to prediabetes and history of mania associated with use. - Guaifenesin (MUCINEX MAXIMUM STRENGTH) 1200 MG TB12; Take 1 tablet (1,200 mg total) by mouth every 12 (twelve) hours as needed.  Dispense: 14 tablet; Refill: 1 - azelastine (ASTELIN) 0.1 % nasal spray; Place 2 sprays into both nostrils 2 (two) times daily. Use in each nostril as directed  Dispense: 30 mL; Refill: 0 - fluconazole (DIFLUCAN) 150 MG tablet; Take 1 tablet (150 mg total) by mouth once. Repeat if needed  Dispense: 2 tablet; Refill: 0 - doxycycline  (VIBRAMYCIN) 100 MG capsule; Take 1 capsule (100 mg total) by mouth 2 (two) times daily.  Dispense: 20 capsule; Refill: 0    Return if symptoms worsen or fail to improve.   Fara Chute, PA-C [facility]

## 2017-04-15 MED FILL — DOXYCYCLINE HYCLATE 100 MG: 100 | 10 days supply | Qty: 20 | Fill #0

## 2017-04-18 NOTE — Addendum Note (Signed)
Addendum  created 04/18/17 1017 by Duane Boston, MD   Sign clinical note

## 2017-04-18 NOTE — Interval H&P Note (Signed)
Anesthesia H&P Update: History and Physical Exam reviewed; patient is OK for planned anesthetic and procedure. ? ?

## 2017-04-27 ENCOUNTER — Ambulatory Visit (INDEPENDENT_AMBULATORY_CARE_PROVIDER_SITE_OTHER): Payer: 59 | Admitting: Family Medicine

## 2017-04-27 VITALS — BP 112/72 | HR 83 | Temp 97.8°F | Ht 65.0 in | Wt 259.0 lb

## 2017-04-27 DIAGNOSIS — IMO0001 Reserved for inherently not codable concepts without codable children: Secondary | ICD-10-CM | POA: Insufficient documentation

## 2017-04-27 DIAGNOSIS — Z9189 Other specified personal risk factors, not elsewhere classified: Secondary | ICD-10-CM | POA: Diagnosis not present

## 2017-04-27 DIAGNOSIS — E669 Obesity, unspecified: Secondary | ICD-10-CM | POA: Diagnosis not present

## 2017-04-27 DIAGNOSIS — R7303 Prediabetes: Secondary | ICD-10-CM

## 2017-04-27 DIAGNOSIS — Z6841 Body Mass Index (BMI) 40.0 and over, adult: Secondary | ICD-10-CM | POA: Diagnosis not present

## 2017-04-27 DIAGNOSIS — F3130 Bipolar disorder, current episode depressed, mild or moderate severity, unspecified: Secondary | ICD-10-CM

## 2017-04-27 DIAGNOSIS — F319 Bipolar disorder, unspecified: Secondary | ICD-10-CM

## 2017-04-27 NOTE — Progress Notes (Signed)
Office: [phone]  /  Fax: [phone]   HPI:   Chief Complaint: OBESITY Caroline Gonzales is here to discuss her progress with her obesity treatment plan. She is on the Category 3 plan and is following her eating plan approximately 30 to 40 % of the time. She states she is exercising 0 minutes 0 times per week. Caroline Gonzales is struggling to stay on track. Her motivation has waned and she is giving into more emotional eating. Caroline Gonzales is tearful in our office today and she is afraid she will be fired from our practice if she doesn't do better. Her weight is 259 lb (117.5 kg) today and has had a weight gain of 2 pounds over a period of 2 weeks since her last visit. She has lost 19 lbs since starting treatment with [name].  Bipolar Depression Caroline Gonzales is tearful in our office today and feels her emotional eating is out of control. She is fearful she is being judged by [name] and others. She has been stable on her medications.  Pre-Diabetes Caroline Gonzales has a diagnosis of pre-diabetes based on her elevated Hgb A1c and was informed this puts her at greater risk of developing diabetes. She is off track with diet and has increased simple carbohydrates and increased polyphagia. She is taking Saxenda both for weight loss and for insulin resistance. She continues to work on diet and exercise to decrease risk of diabetes. She denies nausea, vomiting or hypoglycemia.  At risk for diabetes Caroline Gonzales is at higher than average risk for developing diabetes due to her obesity and pre-diabetes. She currently denies polyuria or polydipsia.  ALLERGIES: Allergies  Allergen Reactions   Tamiflu [Oseltamivir Phosphate] Anaphylaxis   Cephalosporins Rash   Latex Rash   Penicillins Rash    MEDICATIONS: Current Outpatient Prescriptions on File Prior to Visit  Medication Sig Dispense Refill   Cholecalciferol (VITAMIN D3) 5000 units CAPS Take 5,000 Units by mouth at bedtime.     clonazePAM (KLONOPIN) 1 MG tablet Take 1 mg by mouth  2 (two) times daily as needed for anxiety. Taking 0.5mg  1-2 tabs BID prn     ibuprofen (ADVIL,MOTRIN) 200 MG tablet Take 400-600 mg by mouth daily as needed for headache or moderate pain.     Insulin Pen Needle 31G X 6 MM MISC 1 Package by Does not apply route every morning. 100 each 0   L-methylfolate Calcium 15 MG TABS Take 15 m by mouth daily.     lamoTRIgine (LAMICTAL) 200 MG tablet Take 200 mg by mouth at bedtime.     levonorgestrel (MIRENA) 20 MCG/24HR IUD 1 each by Intrauterine route once.     Liraglutide -Weight Management (SAXENDA) 18 MG/3ML SOPN Inject 3 mg into the skin daily. (Patient taking differently: Inject 3 mg into the skin daily. 1.8mg  daily) 5 pen 0   loratadine (CLARITIN) 10 MG tablet Take 10 mg by mouth at bedtime.      Multiple Vitamin (MULTIVITAMIN) tablet Take 1 tablet by mouth Nightly.     propranolol (INDERAL) 40 MG tablet Take 40 mg by mouth 2 (two) times daily.      QUEtiapine (SEROQUEL) 400 MG tablet Take 800 mg by mouth at bedtime.      ranitidine (ZANTAC) 150 MG tablet Take 150 mg by mouth at bedtime.     triamcinolone ointment (KENALOG) 0.1 % Apply 1 application topically daily as needed (rash).   3   No current facility-administered medications on file prior to visit.     PAST MEDICAL  HISTORY: Past Medical History:  Diagnosis Date   Allergy    Zyrtec, Allegra.   Anxiety    followed by Dr. Toy Cookey   Back pain    Bipolar 1 disorder ([place])    Complication of anesthesia    Depression    Fibroid    Gallbladder problem    Gastroparesis 09/14/2012   gastric emptying study in 2014   GERD (gastroesophageal reflux disease)    Pneumonia    2013ish   PONV (postoperative nausea and vomiting)    Pre-diabetes    PTSD (post-traumatic stress disorder)    Vitamin D deficiency     PAST SURGICAL HISTORY: Past Surgical History:  Procedure Laterality Date   CESAREAN SECTION     CHOLECYSTECTOMY     DILATION AND CURETTAGE OF UTERUS       FOREIGN BODY REMOVAL Left 11/18/2016   Procedure: REMOVAL FOREIGN BODY EXTREMITY LEFT FOOT;  Surgeon: Trula Slade, DPM;  Location: [facility];  Service: Podiatry;  Laterality: Left;   PILONIDAL CYST EXCISION  1990's   TENDON REPAIR Left    Left Ankle   WISDOM TOOTH EXTRACTION  19090's    SOCIAL HISTORY: Social History  Substance Use Topics   Smoking status: Former Smoker    Years: 10.00    Types: Cigarettes    Quit date: 2003   Smokeless tobacco: Never Used   Alcohol use 0.0 oz/week     Comment: 1-2 per month     FAMILY HISTORY: Family History  Problem Relation Age of Onset   Cancer Mother        squamous cell carcinoma   Hyperlipidemia Mother    Depression Mother    Anxiety disorder Mother    Obesity Mother    Heart disease Father        cardiomegaly, CHF   Hyperlipidemia Father    Hypertension Father    Mental retardation Father    High blood pressure Father    Depression Father    Anxiety disorder Father    Obesity Father    Cancer Maternal Grandmother    Diabetes Maternal Grandmother    Heart disease Maternal Grandmother    Hyperlipidemia Maternal Grandmother    Hypertension Maternal Grandmother    Stroke Maternal Grandmother    Heart disease Paternal Grandmother    Hypertension Paternal Grandmother    Heart disease Paternal Grandfather    Hyperlipidemia Paternal Grandfather    Mental illness Paternal Grandfather    Breast cancer Maternal Aunt    Thyroid cancer Paternal Aunt     ROS: Review of Systems  Constitutional: Negative for weight loss.  Gastrointestinal: Negative for nausea and vomiting.  Genitourinary: Negative for frequency.  Endo/Heme/Allergies: Negative for polydipsia.       Polyphagia Negative hypoglycemia    PHYSICAL EXAM: Blood pressure 112/72, pulse 83, temperature 97.8 F (36.6 C), temperature source Oral, height 5\' 5"  (1.651 m), weight 259 lb (117.5 kg), SpO2 100 %. Body mass index is 43.1  kg/m. Physical Exam  Constitutional: She is oriented to person, place, and time. She appears well-developed and well-nourished.  Cardiovascular: Normal rate.   Pulmonary/Chest: Effort normal.  Musculoskeletal: Normal range of motion.  Neurological: She is oriented to person, place, and time.  Skin: Skin is warm and dry.  Vitals reviewed.   RECENT LABS AND TESTS: BMET    Component Value Date/Time   NA 138 01/11/2017 1313   K 4.3 01/11/2017 1313   CL 99 01/11/2017 1313   CO2 24  01/11/2017 1313   GLUCOSE 82 01/11/2017 1313   GLUCOSE 87 06/27/2016 0933   BUN 13 01/11/2017 1313   CREATININE 0.84 01/11/2017 1313   CREATININE 0.95 06/27/2016 0933   CALCIUM 9.1 01/11/2017 1313   GFRNONAA 87 01/11/2017 1313   GFRAA 100 01/11/2017 1313   Lab Results  Component Value Date   HGBA1C 5.3 01/11/2017   HGBA1C 5.7 (H) 08/31/2016   HGBA1C 5.6 12/01/2015   Lab Results  Component Value Date   INSULIN 17.0 01/11/2017   INSULIN 17.4 08/31/2016   CBC    Component Value Date/Time   WBC 7.9 11/12/2016 1651   WBC 8.1 06/27/2016 0941   WBC 9.9 04/30/2016 0947   RBC 4.36 11/12/2016 1651   RBC 3.96 (A) 06/27/2016 0941   RBC 4.15 04/30/2016 0947   HGB 11.9 11/12/2016 1651   HCT 36.5 11/12/2016 1651   PLT 332 11/12/2016 1651   MCV 84 11/12/2016 1651   MCH 27.3 11/12/2016 1651   MCH 29.0 06/27/2016 0941   MCH 28.2 04/30/2016 0947   MCHC 32.6 11/12/2016 1651   MCHC 34.5 06/27/2016 0941   MCHC 33.2 04/30/2016 0947   RDW 15.5 (H) 11/12/2016 1651   LYMPHSABS 2.2 11/12/2016 1651   MONOABS 891 04/30/2016 0947   EOSABS 0.1 11/12/2016 1651   BASOSABS 0.0 11/12/2016 1651   Iron/TIBC/Ferritin/ %Sat    Component Value Date/Time   IRON 53 08/31/2016 1644   TIBC 280 08/31/2016 1644   FERRITIN 36 08/31/2016 1644   IRONPCTSAT 19 08/31/2016 1644   Lipid Panel     Component Value Date/Time   CHOL 148 01/11/2017 1313   TRIG 133 01/11/2017 1313   HDL 39 (L) 01/11/2017 1313   CHOLHDL 4.1  12/01/2015 0914   VLDL 37 (H) 12/01/2015 0914   LDLCALC 82 01/11/2017 1313   Hepatic Function Panel     Component Value Date/Time   PROT 7.1 01/11/2017 1313   ALBUMIN 3.8 01/11/2017 1313   AST 17 01/11/2017 1313   ALT 15 01/11/2017 1313   ALKPHOS 99 01/11/2017 1313   BILITOT <0.2 01/11/2017 1313      Component Value Date/Time   TSH 1.620 08/31/2016 1644   TSH 1.83 12/01/2015 0914    ASSESSMENT AND PLAN: Bipolar depression ([place])  Prediabetes  At risk for diabetes mellitus  Class 3 obesity with serious comorbidity and body mass index (BMI) of 40.0 to 44.9 in adult, unspecified obesity type ([place])  PLAN:  Bipolar Depression Caroline Gonzales agrees to continue medications as prescribed and we discussed behavior modification techniques today to help Caroline Gonzales identify and improve emotional eating. May restart topamax in the future to help with emotional eating and to stabilize mood.  Pre-Diabetes Caroline Gonzales will get back to working on weight loss, exercise, and decreasing simple carbohydrates in her diet to help decrease the risk of diabetes. We dicussed Saxenda including benefits and risks. She was informed that eating too many simple carbohydrates or too many calories at one sitting increases the likelihood of GI side effects. Caroline Gonzales agrees to continue [name] but increase to 2.1 mg in the meanwhile and follow up with [name] in 2 weeks to monitor her progress.  Diabetes risk counselling Caroline Gonzales was given extended (30 minutes) diabetes prevention counseling today. She is 41 y.o. female and has risk factors for diabetes including obesity and pre-diabetes. We discussed intensive lifestyle modifications today with an emphasis on weight loss as well as increasing exercise and decreasing simple carbohydrates in her diet.  Obesity Caroline Gonzales is  currently in the action stage of change. As such, her goal is to continue with weight loss efforts She has agreed to follow the Category 3 plan Caroline Gonzales has been  instructed to work up to a goal of 150 minutes of combined cardio and strengthening exercise per week for weight loss and overall health benefits. We discussed the following Behavioral Modification Strategies today: meal planning & cooking strategies, increasing lean protein intake, dealing with family or coworker sabotage, emotional eating strategies and avoiding temptations  Mansi was encouraged to continue improving her health. We discussed ways to improve her motivation and reduce her non productive guilt. We discussed various medication options to help Arthur with her weight loss efforts and we both agreed to increase Saxenda to 5 clicks past 1.8 ( no refills needed) 2.1 mg  Sady has agreed to follow up with our clinic in 2 weeks. She was informed of the importance of frequent follow up visits to maximize her success with intensive lifestyle modifications for her multiple health conditions.  I, Caroline Gonzales, am acting as transcriptionist for Dennard Nip, MD  I have reviewed the above documentation for accuracy and completeness, and I agree with the above. -Dennard Nip, MD  OBESITY BEHAVIORAL INTERVENTION VISIT  Today's visit was # 15 out of 40.  Starting weight: 278 lbs Starting date: 08/31/16 Today's weight : 259 lbs Today's date: 04/27/2017 Total lbs lost to date: 21 (Patients must lose 7 lbs in the first 6 months to continue with counseling)   ASK: We discussed the diagnosis of obesity with Caroline Gonzales today and Caroline Gonzales agreed to give [name] permission to discuss obesity behavioral modification therapy today.  ASSESS: Caroline Gonzales has the diagnosis of obesity and her BMI today is 43.2 Caroline Gonzales is in the action stage of change   ADVISE: Caroline Gonzales was educated on the multiple health risks of obesity as well as the benefit of weight loss to improve her health. She was advised of the need for long term treatment and the importance of lifestyle modifications.  AGREE: Multiple  dietary modification options and treatment options were discussed and  Caroline Gonzales agreed to follow the Category 3 plan We discussed the following Behavioral Modification Strategies today: meal planning & cooking strategies, increasing lean protein intake, dealing with family or coworker sabotage, emotional eating strategies and avoiding temptations

## 2017-05-01 DIAGNOSIS — F411 Generalized anxiety disorder: Secondary | ICD-10-CM | POA: Diagnosis not present

## 2017-05-11 ENCOUNTER — Ambulatory Visit (INDEPENDENT_AMBULATORY_CARE_PROVIDER_SITE_OTHER): Payer: 59 | Admitting: Family Medicine

## 2017-05-11 VITALS — BP 104/70 | HR 94 | Temp 98.2°F | Ht 65.0 in | Wt 256.0 lb

## 2017-05-11 DIAGNOSIS — E669 Obesity, unspecified: Secondary | ICD-10-CM

## 2017-05-11 DIAGNOSIS — IMO0001 Reserved for inherently not codable concepts without codable children: Secondary | ICD-10-CM

## 2017-05-11 DIAGNOSIS — R7303 Prediabetes: Secondary | ICD-10-CM

## 2017-05-11 DIAGNOSIS — Z6841 Body Mass Index (BMI) 40.0 and over, adult: Secondary | ICD-10-CM | POA: Diagnosis not present

## 2017-05-11 NOTE — Progress Notes (Signed)
Office: [phone]  /  Fax: [phone]   HPI:   Chief Complaint: OBESITY Maree is here to discuss her progress with her obesity treatment plan. She is on the Category 3 plan and is following her eating plan approximately 70 % of the time. She states she is walking 8,000 steps for 15 to 30 minutes 5 times per week. Kalisi is on Saxenda 2.1 (5 clicks past 1.8) and feels hunger is better controlled. She gets nauseated if she eats too much. She is journaling on and off and still feels guilt if she makes dietary indiscretions. Her weight is 256 lb (116.1 kg) today and has had a weight loss of 3 pounds over a period of 2 weeks since her last visit. She has lost 22 lbs since starting treatment with [name].  Pre-Diabetes Keonna has a diagnosis of pre-diabetes based on her elevated Hgb A1c and was informed this puts her at greater risk of developing diabetes. She is stable on Saxenda and diet and she is doing better with decreasing simple carbohydrates. She is walking for exercise. Evani continues to work on diet and exercise to decrease risk of diabetes. She denies hypoglycemia.    ALLERGIES: Allergies  Allergen Reactions   Tamiflu [Oseltamivir Phosphate] Anaphylaxis   Cephalosporins Rash   Latex Rash   Penicillins Rash    MEDICATIONS: Current Outpatient Prescriptions on File Prior to Visit  Medication Sig Dispense Refill   Cholecalciferol (VITAMIN D3) 5000 units CAPS Take 5,000 Units by mouth at bedtime.     clonazePAM (KLONOPIN) 1 MG tablet Take 1 mg by mouth 2 (two) times daily as needed for anxiety. Taking 0.5mg  1-2 tabs BID prn     ibuprofen (ADVIL,MOTRIN) 200 MG tablet Take 400-600 mg by mouth daily as needed for headache or moderate pain.     Insulin Pen Needle 31G X 6 MM MISC 1 Package by Does not apply route every morning. 100 each 0   L-methylfolate Calcium 15 MG TABS Take 15 m by mouth daily.     lamoTRIgine (LAMICTAL) 200 MG tablet Take 200 mg by mouth at  bedtime.     levonorgestrel (MIRENA) 20 MCG/24HR IUD 1 each by Intrauterine route once.     Liraglutide -Weight Management (SAXENDA) 18 MG/3ML SOPN Inject 3 mg into the skin daily. (Patient taking differently: Inject 3 mg into the skin daily. 1.8mg  daily) 5 pen 0   loratadine (CLARITIN) 10 MG tablet Take 10 mg by mouth at bedtime.      Multiple Vitamin (MULTIVITAMIN) tablet Take 1 tablet by mouth Nightly.     propranolol (INDERAL) 40 MG tablet Take 40 mg by mouth 2 (two) times daily.      QUEtiapine (SEROQUEL) 400 MG tablet Take 800 mg by mouth at bedtime.      ranitidine (ZANTAC) 150 MG tablet Take 150 mg by mouth at bedtime.     triamcinolone ointment (KENALOG) 0.1 % Apply 1 application topically daily as needed (rash).   3   No current facility-administered medications on file prior to visit.     PAST MEDICAL HISTORY: Past Medical History:  Diagnosis Date   Allergy    Zyrtec, Allegra.   Anxiety    followed by Dr. Toy Cookey   Back pain    Bipolar 1 disorder [facility])    Complication of anesthesia    Depression    Fibroid    Gallbladder problem    Gastroparesis 09/14/2012   gastric emptying study in 2014   GERD (gastroesophageal  reflux disease)    Pneumonia    2013ish   PONV (postoperative nausea and vomiting)    Pre-diabetes    PTSD (post-traumatic stress disorder)    Vitamin D deficiency     PAST SURGICAL HISTORY: Past Surgical History:  Procedure Laterality Date   CESAREAN SECTION     CHOLECYSTECTOMY     DILATION AND CURETTAGE OF UTERUS     FOREIGN BODY REMOVAL Left 11/18/2016   Procedure: REMOVAL FOREIGN BODY EXTREMITY LEFT FOOT;  Surgeon: Trula Slade, DPM;  Location: [facility];  Service: Podiatry;  Laterality: Left;   PILONIDAL CYST EXCISION  1990's   TENDON REPAIR Left    Left Ankle   WISDOM TOOTH EXTRACTION  19090's    SOCIAL HISTORY: Social History  Substance Use Topics   Smoking status: Former Smoker    Years: 10.00    Types:  Cigarettes    Quit date: 2003   Smokeless tobacco: Never Used   Alcohol use 0.0 oz/week     Comment: 1-2 per month     FAMILY HISTORY: Family History  Problem Relation Age of Onset   Cancer Mother        squamous cell carcinoma   Hyperlipidemia Mother    Depression Mother    Anxiety disorder Mother    Obesity Mother    Heart disease Father        cardiomegaly, CHF   Hyperlipidemia Father    Hypertension Father    Mental retardation Father    High blood pressure Father    Depression Father    Anxiety disorder Father    Obesity Father    Cancer Maternal Grandmother    Diabetes Maternal Grandmother    Heart disease Maternal Grandmother    Hyperlipidemia Maternal Grandmother    Hypertension Maternal Grandmother    Stroke Maternal Grandmother    Heart disease Paternal Grandmother    Hypertension Paternal Grandmother    Heart disease Paternal Grandfather    Hyperlipidemia Paternal Grandfather    Mental illness Paternal Grandfather    Breast cancer Maternal Aunt    Thyroid cancer Paternal Aunt     ROS: Review of Systems  Constitutional: Positive for weight loss.  Gastrointestinal: Positive for nausea.  Endo/Heme/Allergies:       Negative hypoglycemia    PHYSICAL EXAM: Blood pressure 104/70, pulse 94, temperature 98.2 F (36.8 C), temperature source Oral, height 5\' 5"  (1.651 m), weight 256 lb (116.1 kg), SpO2 97 %. Body mass index is 42.6 kg/m. Physical Exam  Constitutional: She is oriented to person, place, and time. She appears well-developed and well-nourished.  Cardiovascular: Normal rate.   Pulmonary/Chest: Effort normal.  Musculoskeletal: Normal range of motion.  Neurological: She is oriented to person, place, and time.  Skin: Skin is warm and dry.  Psychiatric: She has a normal mood and affect. Her behavior is normal.  Vitals reviewed.   RECENT LABS AND TESTS: BMET    Component Value Date/Time   NA 138 01/11/2017 1313   K  4.3 01/11/2017 1313   CL 99 01/11/2017 1313   CO2 24 01/11/2017 1313   GLUCOSE 82 01/11/2017 1313   GLUCOSE 87 06/27/2016 0933   BUN 13 01/11/2017 1313   CREATININE 0.84 01/11/2017 1313   CREATININE 0.95 06/27/2016 0933   CALCIUM 9.1 01/11/2017 1313   GFRNONAA 87 01/11/2017 1313   GFRAA 100 01/11/2017 1313   Lab Results  Component Value Date   HGBA1C 5.3 01/11/2017   HGBA1C 5.7 (H) 08/31/2016  HGBA1C 5.6 12/01/2015   Lab Results  Component Value Date   INSULIN 17.0 01/11/2017   INSULIN 17.4 08/31/2016   CBC    Component Value Date/Time   WBC 7.9 11/12/2016 1651   WBC 8.1 06/27/2016 0941   WBC 9.9 04/30/2016 0947   RBC 4.36 11/12/2016 1651   RBC 3.96 (A) 06/27/2016 0941   RBC 4.15 04/30/2016 0947   HGB 11.9 11/12/2016 1651   HCT 36.5 11/12/2016 1651   PLT 332 11/12/2016 1651   MCV 84 11/12/2016 1651   MCH 27.3 11/12/2016 1651   MCH 29.0 06/27/2016 0941   MCH 28.2 04/30/2016 0947   MCHC 32.6 11/12/2016 1651   MCHC 34.5 06/27/2016 0941   MCHC 33.2 04/30/2016 0947   RDW 15.5 (H) 11/12/2016 1651   LYMPHSABS 2.2 11/12/2016 1651   MONOABS 891 04/30/2016 0947   EOSABS 0.1 11/12/2016 1651   BASOSABS 0.0 11/12/2016 1651   Iron/TIBC/Ferritin/ %Sat    Component Value Date/Time   IRON 53 08/31/2016 1644   TIBC 280 08/31/2016 1644   FERRITIN 36 08/31/2016 1644   IRONPCTSAT 19 08/31/2016 1644   Lipid Panel     Component Value Date/Time   CHOL 148 01/11/2017 1313   TRIG 133 01/11/2017 1313   HDL 39 (L) 01/11/2017 1313   CHOLHDL 4.1 12/01/2015 0914   VLDL 37 (H) 12/01/2015 0914   LDLCALC 82 01/11/2017 1313   Hepatic Function Panel     Component Value Date/Time   PROT 7.1 01/11/2017 1313   ALBUMIN 3.8 01/11/2017 1313   AST 17 01/11/2017 1313   ALT 15 01/11/2017 1313   ALKPHOS 99 01/11/2017 1313   BILITOT <0.2 01/11/2017 1313      Component Value Date/Time   TSH 1.620 08/31/2016 1644   TSH 1.83 12/01/2015 0914    ASSESSMENT AND  PLAN: Prediabetes  Class 3 obesity with serious comorbidity and body mass index (BMI) of 40.0 to 44.9 in adult, unspecified obesity type ([place])  PLAN:  Pre-Diabetes Walida will continue to work on weight loss, exercise, and decreasing simple carbohydrates in her diet to help decrease the risk of diabetes. We dicussed metformin including benefits and risks. She was informed that eating too many simple carbohydrates or too many calories at one sitting increases the likelihood of GI side effects. Vietta agrees to continue [name] and we will check labs at next visit. Myrissa agreed to follow up with [name] as directed to monitor her progress.  We spent > than 50% of the 15 minute visit on the counseling as documented in the note.  Obesity Zabdi is currently in the action stage of change. As such, her goal is to continue with weight loss efforts She has agreed to keep a food journal with 1800 calories and 100 grams of protein  Lashaya has been instructed to work up to a goal of 150 minutes of combined cardio and strengthening exercise per week for weight loss and overall health benefits. We discussed the following Behavioral Modification Strategies today: keep a strict food journal, increasing lean protein intake and decreasing simple carbohydrates   Jeny has agreed to follow up with our clinic in 1 to 2 weeks. She was informed of the importance of frequent follow up visits to maximize her success with intensive lifestyle modifications for her multiple health conditions.  I, Doreene Nest, am acting as transcriptionist for Dennard Nip, MD  I have reviewed the above documentation for accuracy and completeness, and I agree with the above. -Caren Leafy Ro,  MD  OBESITY BEHAVIORAL INTERVENTION VISIT  Today's visit was # 16 out of 22.  Starting weight: 278 lbs Starting date: 08/31/16 Today's weight : 256 lbs Today's date: 05/11/2017 Total lbs lost to date: 38 (Patients must lose 7 lbs in  the first 6 months to continue with counseling)   ASK: We discussed the diagnosis of obesity with Marjie Skiff Murtaugh today and Sulema agreed to give [name] permission to discuss obesity behavioral modification therapy today.  ASSESS: Debria has the diagnosis of obesity and her BMI today is 42.6 Sima is in the action stage of change   ADVISE: Riyanshi was educated on the multiple health risks of obesity as well as the benefit of weight loss to improve her health. She was advised of the need for long term treatment and the importance of lifestyle modifications.  AGREE: Multiple dietary modification options and treatment options were discussed and  Bentlie agreed to keep a food journal with 1800 calories and 100 grams of protein  We discussed the following Behavioral Modification Strategies today: keep a strict food journal, increasing lean protein intake and decreasing simple carbohydrates

## 2017-05-13 MED FILL — SAXENDA 18 MG/3 ML PEN: 18 | 30 days supply | Qty: 15 | Fill #0

## 2017-05-14 MED FILL — clonazePAM 0.5 MG TABS: 0.5 | 30 days supply | Qty: 120 | Fill #1

## 2017-05-22 DIAGNOSIS — F411 Generalized anxiety disorder: Secondary | ICD-10-CM | POA: Diagnosis not present

## 2017-05-26 ENCOUNTER — Ambulatory Visit (INDEPENDENT_AMBULATORY_CARE_PROVIDER_SITE_OTHER): Payer: 59 | Admitting: Family Medicine

## 2017-05-26 VITALS — BP 108/74 | HR 78 | Temp 98.8°F | Ht 65.0 in | Wt 261.0 lb

## 2017-05-26 DIAGNOSIS — E669 Obesity, unspecified: Secondary | ICD-10-CM | POA: Diagnosis not present

## 2017-05-26 DIAGNOSIS — Z6841 Body Mass Index (BMI) 40.0 and over, adult: Secondary | ICD-10-CM

## 2017-05-26 DIAGNOSIS — R7303 Prediabetes: Secondary | ICD-10-CM | POA: Diagnosis not present

## 2017-05-26 DIAGNOSIS — F3130 Bipolar disorder, current episode depressed, mild or moderate severity, unspecified: Secondary | ICD-10-CM | POA: Diagnosis not present

## 2017-05-26 DIAGNOSIS — IMO0001 Reserved for inherently not codable concepts without codable children: Secondary | ICD-10-CM

## 2017-05-26 DIAGNOSIS — F319 Bipolar disorder, unspecified: Secondary | ICD-10-CM

## 2017-05-26 NOTE — Progress Notes (Signed)
Office: [phone]  /  Fax: [phone]   HPI:   Chief Complaint: OBESITY Caroline Gonzales is here to discuss her progress with her obesity treatment plan. She is on the  keep a food journal with 1800 calories and 100 grams of protein daily and is following her eating plan approximately 30 to 40 % of the time. She states she is exercising 0 minutes 0 times per week. Caroline Gonzales is tearful in office today and says she has been binge eating and unable to feel motivated to keep with the weight loss. She also does not have much support at home. Her weight is 261 lb (118.4 kg) today and has had a weight gain of 5 pounds over a period of 2 weeks since her last visit. She has lost 17 lbs since starting treatment with [name].  Bipolar Depression Caroline Gonzales is tearful in office today and states she has been binge eating and has been overwhelmed with school. She declines any need for medication adjustment. She shows no sign of suicidal or homicidal ideations.  Pre-Diabetes Caroline Gonzales has a diagnosis of prediabetes based on her elevated HgA1c and was informed this puts her at greater risk of developing diabetes. She is taking saxenda currently and continues to work on diet and exercise to decrease risk of diabetes. She denies nausea or hypoglycemia.   ALLERGIES: Allergies  Allergen Reactions  . Tamiflu [Oseltamivir Phosphate] Anaphylaxis  . Cephalosporins Rash  . Latex Rash  . Penicillins Rash    MEDICATIONS: Current Outpatient Prescriptions on File Prior to Visit  Medication Sig Dispense Refill  . Cholecalciferol (VITAMIN D3) 5000 units CAPS Take 5,000 Units by mouth at bedtime.    . clonazePAM (KLONOPIN) 1 MG tablet Take 1 mg by mouth 2 (two) times daily as needed for anxiety. Taking 0.5mg  1-2 tabs BID prn    . ibuprofen (ADVIL,MOTRIN) 200 MG tablet Take 400-600 mg by mouth daily as needed for headache or moderate pain.    . Insulin Pen Needle 31G X 6 MM MISC 1 Package by Does not apply route every morning.  100 each 0  . L-methylfolate Calcium 15 MG TABS Take 15 m by mouth daily.    [name] lamoTRIgine (LAMICTAL) 200 MG tablet Take 200 mg by mouth at bedtime.    [name] levonorgestrel (MIRENA) 20 MCG/24HR IUD 1 each by Intrauterine route once.    . Liraglutide -Weight Management (SAXENDA) 18 MG/3ML SOPN Inject 3 mg into the skin daily. (Patient taking differently: Inject 3 mg into the skin daily. 1.8mg  daily) 5 pen 0  . loratadine (CLARITIN) 10 MG tablet Take 10 mg by mouth at bedtime.     . Multiple Vitamin (MULTIVITAMIN) tablet Take 1 tablet by mouth Nightly.    . propranolol (INDERAL) 40 MG tablet Take 40 mg by mouth 2 (two) times daily.     . QUEtiapine (SEROQUEL) 400 MG tablet Take 800 mg by mouth at bedtime.     . ranitidine (ZANTAC) 150 MG tablet Take 150 mg by mouth at bedtime.     No current facility-administered medications on file prior to visit.     PAST MEDICAL HISTORY: Past Medical History:  Diagnosis Date  . Allergy    Zyrtec, Allegra.  . Anxiety    followed by Dr. Toy Cookey  . Back pain   . Bipolar 1 disorder ([place])   . Complication of anesthesia   . Depression   . Fibroid   . Gallbladder problem   . Gastroparesis 09/14/2012   gastric emptying  study in 2014  . GERD (gastroesophageal reflux disease)   . Pneumonia    2013ish  . PONV (postoperative nausea and vomiting)   . Pre-diabetes   . PTSD (post-traumatic stress disorder)   . Vitamin D deficiency     PAST SURGICAL HISTORY: Past Surgical History:  Procedure Laterality Date  . CESAREAN SECTION    . CHOLECYSTECTOMY    . DILATION AND CURETTAGE OF UTERUS    . FOREIGN BODY REMOVAL Left 11/18/2016   Procedure: REMOVAL FOREIGN BODY EXTREMITY LEFT FOOT;  Surgeon: Trula Slade, DPM;  Location: [place];  Service: Podiatry;  Laterality: Left;  . PILONIDAL CYST EXCISION  1990's  . TENDON REPAIR Left    Left Ankle  . WISDOM TOOTH EXTRACTION  19090's    SOCIAL HISTORY: Social History  Substance Use Topics  . Smoking status:  Former Smoker    Years: 10.00    Types: Cigarettes    Quit date: 2003  . Smokeless tobacco: Never Used  . Alcohol use 0.0 oz/week     Comment: 1-2 per month     FAMILY HISTORY: Family History  Problem Relation Age of Onset  . Cancer Mother        squamous cell carcinoma  . Hyperlipidemia Mother   . Depression Mother   . Anxiety disorder Mother   . Obesity Mother   . Heart disease Father        cardiomegaly, CHF  . Hyperlipidemia Father   . Hypertension Father   . Mental retardation Father   . High blood pressure Father   . Depression Father   . Anxiety disorder Father   . Obesity Father   . Cancer Maternal Grandmother   . Diabetes Maternal Grandmother   . Heart disease Maternal Grandmother   . Hyperlipidemia Maternal Grandmother   . Hypertension Maternal Grandmother   . Stroke Maternal Grandmother   . Heart disease Paternal Grandmother   . Hypertension Paternal Grandmother   . Heart disease Paternal Grandfather   . Hyperlipidemia Paternal Grandfather   . Mental illness Paternal Grandfather   . Breast cancer Maternal Aunt   . Thyroid cancer Paternal Aunt     ROS: Review of Systems  Constitutional: Negative for weight loss.  Psychiatric/Behavioral: Positive for depression. Negative for suicidal ideas.    PHYSICAL EXAM: Blood pressure 108/74, pulse 78, temperature 98.8 F (37.1 C), temperature source Oral, height 5\' 5"  (1.651 m), weight 261 lb (118.4 kg), SpO2 97 %. Body mass index is 43.43 kg/m. Physical Exam  Constitutional: She is oriented to person, place, and time. She appears well-developed and well-nourished.  Cardiovascular: Normal rate.   Pulmonary/Chest: Effort normal.  Musculoskeletal: Normal range of motion.  Neurological: She is oriented to person, place, and time.  Skin: Skin is warm and dry.  Psychiatric: She exhibits a depressed mood.  Vitals reviewed.   RECENT LABS AND TESTS: BMET    Component Value Date/Time   NA 138 01/11/2017 1313     K 4.3 01/11/2017 1313   CL 99 01/11/2017 1313   CO2 24 01/11/2017 1313   GLUCOSE 82 01/11/2017 1313   GLUCOSE 87 06/27/2016 0933   BUN 13 01/11/2017 1313   CREATININE 0.84 01/11/2017 1313   CREATININE 0.95 06/27/2016 0933   CALCIUM 9.1 01/11/2017 1313   GFRNONAA 87 01/11/2017 1313   GFRAA 100 01/11/2017 1313   Lab Results  Component Value Date   HGBA1C 5.3 01/11/2017   HGBA1C 5.7 (H) 08/31/2016   HGBA1C 5.6 12/01/2015  Lab Results  Component Value Date   INSULIN 17.0 01/11/2017   INSULIN 17.4 08/31/2016   CBC    Component Value Date/Time   WBC 7.9 11/12/2016 1651   WBC 8.1 06/27/2016 0941   WBC 9.9 04/30/2016 0947   RBC 4.36 11/12/2016 1651   RBC 3.96 (A) 06/27/2016 0941   RBC 4.15 04/30/2016 0947   HGB 11.9 11/12/2016 1651   HCT 36.5 11/12/2016 1651   PLT 332 11/12/2016 1651   MCV 84 11/12/2016 1651   MCH 27.3 11/12/2016 1651   MCH 29.0 06/27/2016 0941   MCH 28.2 04/30/2016 0947   MCHC 32.6 11/12/2016 1651   MCHC 34.5 06/27/2016 0941   MCHC 33.2 04/30/2016 0947   RDW 15.5 (H) 11/12/2016 1651   LYMPHSABS 2.2 11/12/2016 1651   MONOABS 891 04/30/2016 0947   EOSABS 0.1 11/12/2016 1651   BASOSABS 0.0 11/12/2016 1651   Iron/TIBC/Ferritin/ %Sat    Component Value Date/Time   IRON 53 08/31/2016 1644   TIBC 280 08/31/2016 1644   FERRITIN 36 08/31/2016 1644   IRONPCTSAT 19 08/31/2016 1644   Lipid Panel     Component Value Date/Time   CHOL 148 01/11/2017 1313   TRIG 133 01/11/2017 1313   HDL 39 (L) 01/11/2017 1313   CHOLHDL 4.1 12/01/2015 0914   VLDL 37 (H) 12/01/2015 0914   LDLCALC 82 01/11/2017 1313   Hepatic Function Panel     Component Value Date/Time   PROT 7.1 01/11/2017 1313   ALBUMIN 3.8 01/11/2017 1313   AST 17 01/11/2017 1313   ALT 15 01/11/2017 1313   ALKPHOS 99 01/11/2017 1313   BILITOT <0.2 01/11/2017 1313      Component Value Date/Time   TSH 1.620 08/31/2016 1644   TSH 1.83 12/01/2015 0914    ASSESSMENT AND PLAN: Bipolar  depression ([place])  Class 3 obesity with serious comorbidity and body mass index (BMI) of 40.0 to 44.9 in adult, unspecified obesity type ([facility])  PLAN:  Bipolar Depression We discussed behavior modification techniques today to help Buena deal with her emotional eating and bipolar depression. Jahnay agrees to continue her medications as prescribed and will follow up with our clinic in 1 week to make sure her mood is stabilized.  Pre-Diabetes Caroline Gonzales will continue to work on weight loss, exercise, and decreasing simple carbohydrates in her diet to help decrease the risk of diabetes. We dicussed saxenda including benefits and risks. She was informed that eating too many simple carbohydrates or too many calories at one sitting increases the likelihood of GI side effects. Caroline Gonzales agreed to follow up with [name] as directed to monitor her progress.    We spent > than 50% of the 15 minute visit on the counseling as documented in the note.  Obesity Caroline Gonzales is currently in the action stage of change. As such, her goal is to continue with weight loss efforts She has agreed to keep a food journal with 1800 calories and 100+ grams of protein daily Burnadette has been instructed to work up to a goal of 150 minutes of combined cardio and strengthening exercise per week for weight loss and overall health benefits. We discussed the following Behavioral Modification Strategies today: increasing lean protein intake and work on meal planning and easy cooking plans  Caroline Gonzales has agreed to follow up with our clinic in 1 week. She was informed of the importance of frequent follow up visits to maximize her success with intensive lifestyle modifications for her multiple health conditions.  Caroline Gonzales  Valere Dross, am acting as Location manager for Dennard Nip, MD  I have reviewed the above documentation for accuracy and completeness, and I agree with the above. -Dennard Nip, MD    OBESITY BEHAVIORAL INTERVENTION  VISIT  Today's visit was # 17 out of 72.  Starting weight: 278 lbs Starting date: 08/31/16 Today's weight : 261 lbs Today's date: 05/26/2017 Total lbs lost to date: 17 (Patients must lose 7 lbs in the first 6 months to continue with counseling)   ASK: We discussed the diagnosis of obesity with Caroline Gonzales today and Caroline Gonzales agreed to give [name] permission to discuss obesity behavioral modification therapy today.  ASSESS: Caroline Gonzales has the diagnosis of obesity and her BMI today is 43.43 Caroline Gonzales is in the action stage of change   ADVISE: Caroline Gonzales was educated on the multiple health risks of obesity as well as the benefit of weight loss to improve her health. She was advised of the need for long term treatment and the importance of lifestyle modifications.  AGREE: Multiple dietary modification options and treatment options were discussed and  Caroline Gonzales agreed to keep a food journal with 1800 calories and 100+ grams of protein daily We discussed the following Behavioral Modification Strategies today: increasing lean protein intake and work on meal planning and easy cooking plans

## 2017-06-01 ENCOUNTER — Ambulatory Visit (INDEPENDENT_AMBULATORY_CARE_PROVIDER_SITE_OTHER): Payer: 59 | Admitting: Physician Assistant

## 2017-06-01 VITALS — BP 111/72 | HR 87 | Temp 97.9°F | Ht 65.0 in | Wt 257.0 lb

## 2017-06-01 DIAGNOSIS — E669 Obesity, unspecified: Secondary | ICD-10-CM | POA: Diagnosis not present

## 2017-06-01 DIAGNOSIS — Z6841 Body Mass Index (BMI) 40.0 and over, adult: Secondary | ICD-10-CM

## 2017-06-01 DIAGNOSIS — E8881 Metabolic syndrome: Secondary | ICD-10-CM | POA: Diagnosis not present

## 2017-06-01 DIAGNOSIS — IMO0001 Reserved for inherently not codable concepts without codable children: Secondary | ICD-10-CM

## 2017-06-02 ENCOUNTER — Encounter (INDEPENDENT_AMBULATORY_CARE_PROVIDER_SITE_OTHER): Payer: Self-pay

## 2017-06-02 ENCOUNTER — Ambulatory Visit (INDEPENDENT_AMBULATORY_CARE_PROVIDER_SITE_OTHER): Payer: 59 | Admitting: Physician Assistant

## 2017-06-02 NOTE — Progress Notes (Signed)
Office: [phone]  /  Fax: [phone]   HPI:   Chief Complaint: OBESITY Caroline Gonzales is here to discuss her progress with her obesity treatment plan. She is on the keep a food journal with 1800 calories and 100 grams of protein daily and is following her eating plan approximately 50 % of the time. She states she is exercising 0 minutes 0 times per week. Caroline Gonzales continues to do well with weight loss. She has been doing better following the plan. She still struggles with meal planning. Her weight is 257 lb (116.6 kg) today and has had a weight loss of 4 pounds over a period of 1 week since her last visit. She has lost 21 lbs since starting treatment with [name].  Insulin Resistance Caroline Gonzales has a diagnosis of insulin resistance based on her elevated fasting insulin level >5. Although Caroline Gonzales's blood glucose readings are still under good control, insulin resistance puts her at greater risk of metabolic syndrome and diabetes. She is not taking metformin currently and continues to work on diet and exercise to decrease risk of diabetes.   ALLERGIES: Allergies  Allergen Reactions  . Tamiflu [Oseltamivir Phosphate] Anaphylaxis  . Cephalosporins Rash  . Latex Rash  . Penicillins Rash    MEDICATIONS: Current Outpatient Prescriptions on File Prior to Visit  Medication Sig Dispense Refill  . Cholecalciferol (VITAMIN D3) 5000 units CAPS Take 5,000 Units by mouth at bedtime.    . clonazePAM (KLONOPIN) 1 MG tablet Take 1 mg by mouth 2 (two) times daily as needed for anxiety. Taking 0.5mg  1-2 tabs BID prn    . ibuprofen (ADVIL,MOTRIN) 200 MG tablet Take 400-600 mg by mouth daily as needed for headache or moderate pain.    . Insulin Pen Needle 31G X 6 MM MISC 1 Package by Does not apply route every morning. 100 each 0  . L-methylfolate Calcium 15 MG TABS Take 15 m by mouth daily.    [name] lamoTRIgine (LAMICTAL) 200 MG tablet Take 200 mg by mouth at bedtime.    [name] levonorgestrel (MIRENA) 20 MCG/24HR IUD 1  each by Intrauterine route once.    . Liraglutide -Weight Management (SAXENDA) 18 MG/3ML SOPN Inject 3 mg into the skin daily. (Patient taking differently: Inject 3 mg into the skin daily. 1.8mg  daily) 5 pen 0  . loratadine (CLARITIN) 10 MG tablet Take 10 mg by mouth at bedtime.     . Multiple Vitamin (MULTIVITAMIN) tablet Take 1 tablet by mouth Nightly.    . propranolol (INDERAL) 40 MG tablet Take 40 mg by mouth 2 (two) times daily.     . QUEtiapine (SEROQUEL) 400 MG tablet Take 800 mg by mouth at bedtime.     . ranitidine (ZANTAC) 150 MG tablet Take 150 mg by mouth at bedtime.     No current facility-administered medications on file prior to visit.     PAST MEDICAL HISTORY: Past Medical History:  Diagnosis Date  . Allergy    Zyrtec, Allegra.  . Anxiety    followed by Dr. Toy Cookey  . Back pain   . Bipolar 1 disorder ([place])   . Complication of anesthesia   . Depression   . Fibroid   . Gallbladder problem   . Gastroparesis 09/14/2012   gastric emptying study in 2014  . GERD (gastroesophageal reflux disease)   . Pneumonia    2013ish  . PONV (postoperative nausea and vomiting)   . Pre-diabetes   . PTSD (post-traumatic stress disorder)   . Vitamin D deficiency  PAST SURGICAL HISTORY: Past Surgical History:  Procedure Laterality Date  . CESAREAN SECTION    . CHOLECYSTECTOMY    . DILATION AND CURETTAGE OF UTERUS    . FOREIGN BODY REMOVAL Left 11/18/2016   Procedure: REMOVAL FOREIGN BODY EXTREMITY LEFT FOOT;  Surgeon: Trula Slade, DPM;  Location: [place];  Service: Podiatry;  Laterality: Left;  . PILONIDAL CYST EXCISION  1990's  . TENDON REPAIR Left    Left Ankle  . WISDOM TOOTH EXTRACTION  19090's    SOCIAL HISTORY: Social History  Substance Use Topics  . Smoking status: Former Smoker    Years: 10.00    Types: Cigarettes    Quit date: 2003  . Smokeless tobacco: Never Used  . Alcohol use 0.0 oz/week     Comment: 1-2 per month     FAMILY HISTORY: Family History    Problem Relation Age of Onset  . Cancer Mother        squamous cell carcinoma  . Hyperlipidemia Mother   . Depression Mother   . Anxiety disorder Mother   . Obesity Mother   . Heart disease Father        cardiomegaly, CHF  . Hyperlipidemia Father   . Hypertension Father   . Mental retardation Father   . High blood pressure Father   . Depression Father   . Anxiety disorder Father   . Obesity Father   . Cancer Maternal Grandmother   . Diabetes Maternal Grandmother   . Heart disease Maternal Grandmother   . Hyperlipidemia Maternal Grandmother   . Hypertension Maternal Grandmother   . Stroke Maternal Grandmother   . Heart disease Paternal Grandmother   . Hypertension Paternal Grandmother   . Heart disease Paternal Grandfather   . Hyperlipidemia Paternal Grandfather   . Mental illness Paternal Grandfather   . Breast cancer Maternal Aunt   . Thyroid cancer Paternal Aunt     ROS: Review of Systems  Constitutional: Positive for weight loss.    PHYSICAL EXAM: Blood pressure 111/72, pulse 87, temperature 97.9 F (36.6 C), temperature source Oral, height 5\' 5"  (1.651 m), weight 257 lb (116.6 kg), SpO2 98 %. Body mass index is 42.77 kg/m. Physical Exam  Constitutional: She is oriented to person, place, and time. She appears well-developed and well-nourished.  Cardiovascular: Normal rate.   Pulmonary/Chest: Effort normal.  Musculoskeletal: Normal range of motion.  Neurological: She is oriented to person, place, and time.  Skin: Skin is warm and dry.  Psychiatric: She has a normal mood and affect. Her behavior is normal.  Vitals reviewed.   RECENT LABS AND TESTS: BMET    Component Value Date/Time   NA 138 01/11/2017 1313   K 4.3 01/11/2017 1313   CL 99 01/11/2017 1313   CO2 24 01/11/2017 1313   GLUCOSE 82 01/11/2017 1313   GLUCOSE 87 06/27/2016 0933   BUN 13 01/11/2017 1313   CREATININE 0.84 01/11/2017 1313   CREATININE 0.95 06/27/2016 0933   CALCIUM 9.1  01/11/2017 1313   GFRNONAA 87 01/11/2017 1313   GFRAA 100 01/11/2017 1313   Lab Results  Component Value Date   HGBA1C 5.3 01/11/2017   HGBA1C 5.7 (H) 08/31/2016   HGBA1C 5.6 12/01/2015   Lab Results  Component Value Date   INSULIN 17.0 01/11/2017   INSULIN 17.4 08/31/2016   CBC    Component Value Date/Time   WBC 7.9 11/12/2016 1651   WBC 8.1 06/27/2016 0941   WBC 9.9 04/30/2016 0947   RBC  4.36 11/12/2016 1651   RBC 3.96 (A) 06/27/2016 0941   RBC 4.15 04/30/2016 0947   HGB 11.9 11/12/2016 1651   HCT 36.5 11/12/2016 1651   PLT 332 11/12/2016 1651   MCV 84 11/12/2016 1651   MCH 27.3 11/12/2016 1651   MCH 29.0 06/27/2016 0941   MCH 28.2 04/30/2016 0947   MCHC 32.6 11/12/2016 1651   MCHC 34.5 06/27/2016 0941   MCHC 33.2 04/30/2016 0947   RDW 15.5 (H) 11/12/2016 1651   LYMPHSABS 2.2 11/12/2016 1651   MONOABS 891 04/30/2016 0947   EOSABS 0.1 11/12/2016 1651   BASOSABS 0.0 11/12/2016 1651   Iron/TIBC/Ferritin/ %Sat    Component Value Date/Time   IRON 53 08/31/2016 1644   TIBC 280 08/31/2016 1644   FERRITIN 36 08/31/2016 1644   IRONPCTSAT 19 08/31/2016 1644   Lipid Panel     Component Value Date/Time   CHOL 148 01/11/2017 1313   TRIG 133 01/11/2017 1313   HDL 39 (L) 01/11/2017 1313   CHOLHDL 4.1 12/01/2015 0914   VLDL 37 (H) 12/01/2015 0914   LDLCALC 82 01/11/2017 1313   Hepatic Function Panel     Component Value Date/Time   PROT 7.1 01/11/2017 1313   ALBUMIN 3.8 01/11/2017 1313   AST 17 01/11/2017 1313   ALT 15 01/11/2017 1313   ALKPHOS 99 01/11/2017 1313   BILITOT <0.2 01/11/2017 1313      Component Value Date/Time   TSH 1.620 08/31/2016 1644   TSH 1.83 12/01/2015 0914    ASSESSMENT AND PLAN: Insulin resistance  Class 3 obesity with serious comorbidity and body mass index (BMI) of 40.0 to 44.9 in adult, unspecified obesity type (Caroline Gonzales)  PLAN:  Insulin Resistance Dorraine will continue to work on weight loss, exercise, and decreasing simple  carbohydrates in her diet to help decrease the risk of diabetes. She was informed that eating too many simple carbohydrates or too many calories at one sitting increases the likelihood of GI side effects. We will re-check labs at next visit and Caroline Gonzales agreed to follow up with [name] as directed to monitor her progress.  We spent > than 50% of the 15 minute visit on the counseling as documented in the note.   Obesity Caroline Gonzales is currently in the action stage of change. As such, her goal is to continue with weight loss efforts She has agreed to keep a food journal with 1800 calories and 100 grams of protein daily Caroline Gonzales has been instructed to work up to a goal of 150 minutes of combined cardio and strengthening exercise per week for weight loss and overall health benefits. We discussed the following Behavioral Modification Strategies today: keeping healthy foods in the home and work on meal planning and easy cooking plans  We discussed various medication options to help Caroline Gonzales with her weight loss efforts and we both agreed to continue Saxenda Caroline Gonzales is at 1.5 mg)  Caroline Gonzales has agreed to follow up with our clinic in 3 weeks. She was informed of the importance of frequent follow up visits to maximize her success with intensive lifestyle modifications for her multiple health conditions.  I, Doreene Nest, am acting as transcriptionist for Caroline Duverney, PA-C  I have reviewed the above documentation for accuracy and completeness, and I agree with the above. -Caroline Duverney, PA-C  I have reviewed the above note and agree with the plan. -Dennard Nip, MD   OBESITY BEHAVIORAL INTERVENTION VISIT  Today's visit was # 18 out of 22.  Starting weight: 278  lbs Starting date: 08/31/16 Today's weight : 257 lbs Today's date: 06/01/2017 Total lbs lost to date: 21 (Patients must lose 7 lbs in the first 6 months to continue with counseling)   ASK: We discussed the diagnosis of obesity with Marjie Skiff Abella  today and Caroline Gonzales agreed to give [name] permission to discuss obesity behavioral modification therapy today.  ASSESS: Nakyah has the diagnosis of obesity and her BMI today is 42.77 Aylssa is in the action stage of change   ADVISE: Caroline Gonzales was educated on the multiple health risks of obesity as well as the benefit of weight loss to improve her health. She was advised of the need for long term treatment and the importance of lifestyle modifications.  AGREE: Multiple dietary modification options and treatment options were discussed and  Caroline Gonzales agreed to keep a food journal with 1800 calories and 100 grams of protein daily We discussed the following Behavioral Modification Strategies today: keeping healthy foods in the home and work on meal planning and easy cooking plans

## 2017-06-08 DIAGNOSIS — F4312 Post-traumatic stress disorder, chronic: Secondary | ICD-10-CM | POA: Diagnosis not present

## 2017-06-08 DIAGNOSIS — F331 Major depressive disorder, recurrent, moderate: Secondary | ICD-10-CM | POA: Diagnosis not present

## 2017-06-09 DIAGNOSIS — K210 Gastro-esophageal reflux disease with esophagitis: Secondary | ICD-10-CM | POA: Diagnosis not present

## 2017-06-09 DIAGNOSIS — K3184 Gastroparesis: Secondary | ICD-10-CM | POA: Diagnosis not present

## 2017-06-16 DIAGNOSIS — F331 Major depressive disorder, recurrent, moderate: Secondary | ICD-10-CM | POA: Diagnosis not present

## 2017-06-16 DIAGNOSIS — F4312 Post-traumatic stress disorder, chronic: Secondary | ICD-10-CM | POA: Diagnosis not present

## 2017-06-21 MED FILL — PROPRANOLOL 40 MG TABLET: 40 | 90 days supply | Qty: 180 | Fill #6

## 2017-06-21 MED FILL — clonazePAM 0.5 MG TABS: 0.5 | 30 days supply | Qty: 120 | Fill #2

## 2017-06-22 ENCOUNTER — Ambulatory Visit (INDEPENDENT_AMBULATORY_CARE_PROVIDER_SITE_OTHER): Payer: 59 | Admitting: Physician Assistant

## 2017-06-22 VITALS — BP 112/72 | HR 85 | Temp 97.7°F | Ht 65.0 in | Wt 258.0 lb

## 2017-06-22 DIAGNOSIS — Z6841 Body Mass Index (BMI) 40.0 and over, adult: Secondary | ICD-10-CM | POA: Diagnosis not present

## 2017-06-22 DIAGNOSIS — Z9189 Other specified personal risk factors, not elsewhere classified: Secondary | ICD-10-CM | POA: Diagnosis not present

## 2017-06-22 DIAGNOSIS — Z862 Personal history of diseases of the blood and blood-forming organs and certain disorders involving the immune mechanism: Secondary | ICD-10-CM | POA: Diagnosis not present

## 2017-06-22 DIAGNOSIS — E781 Pure hyperglyceridemia: Secondary | ICD-10-CM

## 2017-06-22 DIAGNOSIS — E559 Vitamin D deficiency, unspecified: Secondary | ICD-10-CM | POA: Diagnosis not present

## 2017-06-22 DIAGNOSIS — R5383 Other fatigue: Secondary | ICD-10-CM | POA: Diagnosis not present

## 2017-06-22 DIAGNOSIS — R7303 Prediabetes: Secondary | ICD-10-CM | POA: Diagnosis not present

## 2017-06-22 NOTE — Progress Notes (Signed)
Office: [phone]  /  Fax: [phone]   HPI:   Chief Complaint: OBESITY Caroline Gonzales is here to discuss her progress with her obesity treatment plan. She is on the Category 3 plan and is following her eating plan approximately 40 to 50 % of the time. She states she is exercising 0 minutes 0 times per week. Caroline Gonzales has had an increase in cravings. She has had recent adjustment of her Bipolar Depression medicines and states the regimen change has contributed to her increased cravings and increased fatigue.  She is scheduled to have a follow up appointment with Psychiatrist for further management of her anxiety and depression Her weight is 258 lb (117 kg) today and has had a weight gain of 1 pound over a period of 3 weeks since her last visit. She has lost 20 lbs since starting treatment with [name].  Fatigue Caroline Gonzales admits to increase ongoing fatigue, insomnia, daytime sleepiness, low energy,  and un-restorative sleep.   Vitamin D deficiency Caroline Gonzales has a diagnosis of vitamin D deficiency. She is currently taking OTC multi vitamin and denies nausea, vomiting or muscle weakness.  Pre-Diabetes Caroline Gonzales has a diagnosis of pre-diabetes based on her elevated Hgb A1c and was informed this puts her at greater risk of developing diabetes. She is not taking metformin currently and continues to work on diet and exercise to decrease risk of diabetes. She denies nausea, polyphagia or hypoglycemia.  At risk for diabetes Caroline Gonzales is at higher than average risk for developing diabetes due to her obesity and pre-diabetes. She currently denies polyuria or polydipsia.  Hypertriglyceridemia Caroline Gonzales has hyperlipidemia and has been trying to improve her cholesterol levels with intensive lifestyle modification including a low saturated fat diet, exercise and weight loss. She denies any chest pain, claudication or myalgias.  History of Anemia Caroline Gonzales has a history of anemia and admits to ongoing  fatigue.  ALLERGIES: Allergies  Allergen Reactions  . Tamiflu [Oseltamivir Phosphate] Anaphylaxis  . Cephalosporins Rash  . Latex Rash  . Penicillins Rash    MEDICATIONS: Current Outpatient Prescriptions on File Prior to Visit  Medication Sig Dispense Refill  . Cholecalciferol (VITAMIN D3) 5000 units CAPS Take 5,000 Units by mouth at bedtime.    . clonazePAM (KLONOPIN) 1 MG tablet Take 1 mg by mouth 2 (two) times daily as needed for anxiety. Taking 0.5mg  1-2 tabs BID prn    . ibuprofen (ADVIL,MOTRIN) 200 MG tablet Take 400-600 mg by mouth daily as needed for headache or moderate pain.    . Insulin Pen Needle 31G X 6 MM MISC 1 Package by Does not apply route every morning. 100 each 0  . L-methylfolate Calcium 15 MG TABS Take 15 m by mouth daily.    [name] lamoTRIgine (LAMICTAL) 200 MG tablet Take 200 mg by mouth at bedtime.    [name] levonorgestrel (MIRENA) 20 MCG/24HR IUD 1 each by Intrauterine route once.    . Liraglutide -Weight Management (SAXENDA) 18 MG/3ML SOPN Inject 3 mg into the skin daily. (Patient taking differently: Inject 3 mg into the skin daily. 1.8mg  daily) 5 pen 0  . loratadine (CLARITIN) 10 MG tablet Take 10 mg by mouth at bedtime.     . Multiple Vitamin (MULTIVITAMIN) tablet Take 1 tablet by mouth Nightly.    . propranolol (INDERAL) 40 MG tablet Take 40 mg by mouth 2 (two) times daily.     . QUEtiapine (SEROQUEL) 400 MG tablet Take 800 mg by mouth at bedtime.      No current  facility-administered medications on file prior to visit.     PAST MEDICAL HISTORY: Past Medical History:  Diagnosis Date  . Allergy    Zyrtec, Allegra.  . Anxiety    followed by Dr. Toy Cookey  . Back pain   . Bipolar 1 disorder ([place])   . Complication of anesthesia   . Depression   . Fibroid   . Gallbladder problem   . Gastroparesis 09/14/2012   gastric emptying study in 2014  . GERD (gastroesophageal reflux disease)   . Pneumonia    2013ish  . PONV (postoperative nausea and vomiting)   .  Pre-diabetes   . PTSD (post-traumatic stress disorder)   . Vitamin D deficiency     PAST SURGICAL HISTORY: Past Surgical History:  Procedure Laterality Date  . CESAREAN SECTION    . CHOLECYSTECTOMY    . DILATION AND CURETTAGE OF UTERUS    . FOREIGN BODY REMOVAL Left 11/18/2016   Procedure: REMOVAL FOREIGN BODY EXTREMITY LEFT FOOT;  Surgeon: Trula Slade, DPM;  Location: [facility];  Service: Podiatry;  Laterality: Left;  . PILONIDAL CYST EXCISION  1990's  . TENDON REPAIR Left    Left Ankle  . WISDOM TOOTH EXTRACTION  19090's    SOCIAL HISTORY: Social History  Substance Use Topics  . Smoking status: Former Smoker    Years: 10.00    Types: Cigarettes    Quit date: 2003  . Smokeless tobacco: Never Used  . Alcohol use 0.0 oz/week     Comment: 1-2 per month     FAMILY HISTORY: Family History  Problem Relation Age of Onset  . Cancer Mother        squamous cell carcinoma  . Hyperlipidemia Mother   . Depression Mother   . Anxiety disorder Mother   . Obesity Mother   . Heart disease Father        cardiomegaly, CHF  . Hyperlipidemia Father   . Hypertension Father   . Mental retardation Father   . High blood pressure Father   . Depression Father   . Anxiety disorder Father   . Obesity Father   . Cancer Maternal Grandmother   . Diabetes Maternal Grandmother   . Heart disease Maternal Grandmother   . Hyperlipidemia Maternal Grandmother   . Hypertension Maternal Grandmother   . Stroke Maternal Grandmother   . Heart disease Paternal Grandmother   . Hypertension Paternal Grandmother   . Heart disease Paternal Grandfather   . Hyperlipidemia Paternal Grandfather   . Mental illness Paternal Grandfather   . Breast cancer Maternal Aunt   . Thyroid cancer Paternal Aunt     ROS: Review of Systems  Constitutional: Positive for malaise/fatigue. Negative for weight loss.  Cardiovascular: Negative for chest pain and claudication.  Gastrointestinal: Negative for nausea and  vomiting.  Genitourinary: Negative for frequency.  Musculoskeletal: Negative for myalgias.       Negative muscle weakness  Endo/Heme/Allergies: Negative for polydipsia.       Negative polyphagia Negative hypoglycemia    PHYSICAL EXAM: Blood pressure 112/72, pulse 85, temperature 97.7 F (36.5 C), height 5\' 5"  (1.651 m), weight 258 lb (117 kg), SpO2 100 %. Body mass index is 42.93 kg/m. Physical Exam  Constitutional: She is oriented to person, place, and time. She appears well-developed and well-nourished.  Cardiovascular: Normal rate.   Pulmonary/Chest: Effort normal.  Musculoskeletal: Normal range of motion.  Neurological: She is oriented to person, place, and time.  Skin: Skin is warm and dry.  Psychiatric: She  has a normal mood and affect. Her behavior is normal.  Vitals reviewed.   RECENT LABS AND TESTS: BMET    Component Value Date/Time   NA 138 01/11/2017 1313   K 4.3 01/11/2017 1313   CL 99 01/11/2017 1313   CO2 24 01/11/2017 1313   GLUCOSE 82 01/11/2017 1313   GLUCOSE 87 06/27/2016 0933   BUN 13 01/11/2017 1313   CREATININE 0.84 01/11/2017 1313   CREATININE 0.95 06/27/2016 0933   CALCIUM 9.1 01/11/2017 1313   GFRNONAA 87 01/11/2017 1313   GFRAA 100 01/11/2017 1313   Lab Results  Component Value Date   HGBA1C 5.3 01/11/2017   HGBA1C 5.7 (H) 08/31/2016   HGBA1C 5.6 12/01/2015   Lab Results  Component Value Date   INSULIN 17.0 01/11/2017   INSULIN 17.4 08/31/2016   CBC    Component Value Date/Time   WBC 7.9 11/12/2016 1651   WBC 8.1 06/27/2016 0941   WBC 9.9 04/30/2016 0947   RBC 4.36 11/12/2016 1651   RBC 3.96 (A) 06/27/2016 0941   RBC 4.15 04/30/2016 0947   HGB 11.9 11/12/2016 1651   HCT 36.5 11/12/2016 1651   PLT 332 11/12/2016 1651   MCV 84 11/12/2016 1651   MCH 27.3 11/12/2016 1651   MCH 29.0 06/27/2016 0941   MCH 28.2 04/30/2016 0947   MCHC 32.6 11/12/2016 1651   MCHC 34.5 06/27/2016 0941   MCHC 33.2 04/30/2016 0947   RDW 15.5 (H)  11/12/2016 1651   LYMPHSABS 2.2 11/12/2016 1651   MONOABS 891 04/30/2016 0947   EOSABS 0.1 11/12/2016 1651   BASOSABS 0.0 11/12/2016 1651   Iron/TIBC/Ferritin/ %Sat    Component Value Date/Time   IRON 53 08/31/2016 1644   TIBC 280 08/31/2016 1644   FERRITIN 36 08/31/2016 1644   IRONPCTSAT 19 08/31/2016 1644   Lipid Panel     Component Value Date/Time   CHOL 148 01/11/2017 1313   TRIG 133 01/11/2017 1313   HDL 39 (L) 01/11/2017 1313   CHOLHDL 4.1 12/01/2015 0914   VLDL 37 (H) 12/01/2015 0914   LDLCALC 82 01/11/2017 1313   Hepatic Function Panel     Component Value Date/Time   PROT 7.1 01/11/2017 1313   ALBUMIN 3.8 01/11/2017 1313   AST 17 01/11/2017 1313   ALT 15 01/11/2017 1313   ALKPHOS 99 01/11/2017 1313   BILITOT <0.2 01/11/2017 1313      Component Value Date/Time   TSH 1.620 08/31/2016 1644   TSH 1.83 12/01/2015 0914    ASSESSMENT AND PLAN: Other fatigue - Plan: Ambulatory referral to Sleep Studies  Vitamin D deficiency - Plan: VITAMIN D 25 Hydroxy (Vit-D Deficiency, Fractures)  Prediabetes - Plan: Comprehensive metabolic panel, Hemoglobin A1c, Insulin, random  Hypertriglyceridemia - Plan: Lipid Panel With LDL/HDL Ratio  History of anemia - Plan: CBC With Differential  At risk for diabetes mellitus  Class 3 severe obesity with serious comorbidity and body mass index (BMI) of 40.0 to 44.9 in adult, unspecified obesity type (Caroline Gonzales)  PLAN:  Fatigue Caroline Gonzales was informed that her fatigue may be related to obesity, depression or many other causes. Caroline Gonzales has agreed to continue to work on diet, exercise and weight loss to help with fatigue. We will refer Caroline Gonzales for a sleep study at [facility] and she agrees to follow up with our clinic in 2 to 3 weeks.  Vitamin D Deficiency Caroline Gonzales was informed that low vitamin D levels contributes to fatigue and are associated with obesity, breast,  and colon cancer. She agrees to continue  to take multi vitamin and will follow up for routine testing of vitamin D, at least 2-3 times per year. We will check labs and she agrees to follow up as directed.  Pre-Diabetes Caroline Gonzales will continue to work on weight loss, exercise, and decreasing simple carbohydrates in her diet to help decrease the risk of diabetes. She was informed that eating too many simple carbohydrates or too many calories at one sitting increases the likelihood of GI side effects. We will check labs and Caroline Gonzales agreed to follow up with [name] as directed to monitor her progress.  Diabetes risk counseling Caroline Gonzales was given extended (15 minutes) diabetes prevention counseling today. She is 41 y.o. female and has risk factors for diabetes including obesity and pre-diabetes. We discussed intensive lifestyle modifications today with an emphasis on weight loss as well as increasing exercise and decreasing simple carbohydrates in her diet.  Hypertriglyceridemia Caroline Gonzales was informed of the American Heart Association Guidelines emphasizing intensive lifestyle modifications as the first line treatment for hyperlipidemia. We discussed many lifestyle modifications today in depth, and Caroline Gonzales will continue to work on decreasing saturated fats such as fatty red meat, butter and many fried foods. She will also increase vegetables and lean protein in her diet and continue to work on exercise and weight loss efforts. We will check labs and Caroline Gonzales agrees to follow up with our clinic in 2 to 3 weeks.  History of Anemia We will check labs and Caroline Gonzales agrees to continue with diet, exercise and weight loss and will follow up with our clinic in 2 to 3 weeks.  Obesity Caroline Gonzales is currently in the action stage of change. As such, her goal is to continue with weight loss efforts She has agreed to follow the Category 3 plan Caroline Gonzales has been instructed to work up to a goal of 150 minutes of combined cardio and strengthening exercise per week for weight  loss and overall health benefits. We discussed the following Behavioral Modification Strategies today: increasing lean protein intake and planning for success  Caroline Gonzales has agreed to follow up with our clinic in 2 to 3 weeks. She was informed of the importance of frequent follow up visits to maximize her success with intensive lifestyle modifications for her multiple health conditions.  I, Doreene Nest, am acting as transcriptionist for Lacy Duverney, PA-C  I have reviewed the above documentation for accuracy and completeness, and I agree with the above. -Lacy Duverney, PA-C  I have reviewed the above note and agree with the plan. -Dennard Nip, MD  OBESITY BEHAVIORAL INTERVENTION VISIT  Today's visit was # 19 out of 22.  Starting weight: 278 lbs Starting date: 08/31/16 Today's weight : 258 lbs  Today's date: 06/22/2017 Total lbs lost to date: 20 (Patients must lose 7 lbs in the first 6 months to continue with counseling)   ASK: We discussed the diagnosis of obesity with Marjie Skiff Willeford today and Carrieanne agreed to give [name] permission to discuss obesity behavioral modification therapy today.  ASSESS: Travis has the diagnosis of obesity and her BMI today is 42.93 Albina is in the action stage of change   ADVISE: Noralee was educated on the multiple health risks of obesity as well as the benefit of weight loss to improve her health. She was advised of the need for long term treatment and the importance of lifestyle modifications.  AGREE: Multiple dietary modification options and treatment options were discussed and  Guadalupe agreed to follow  the Category 3 plan We discussed the following Behavioral Modification Strategies today: increasing lean protein intake and planning for success

## 2017-06-23 ENCOUNTER — Encounter (INDEPENDENT_AMBULATORY_CARE_PROVIDER_SITE_OTHER): Payer: Self-pay | Admitting: Family Medicine

## 2017-06-23 LAB — CBC WITH DIFFERENTIAL
Basophils Absolute: 0 10*3/uL (ref 0.0–0.2)
Basos: 0 %
EOS (ABSOLUTE): 0.2 10*3/uL (ref 0.0–0.4)
Eos: 3 %
Hematocrit: 34.8 % (ref 34.0–46.6)
Hemoglobin: 11.2 g/dL (ref 11.1–15.9)
Immature Grans (Abs): 0 10*3/uL (ref 0.0–0.1)
Immature Granulocytes: 0 %
Lymphocytes Absolute: 1.5 10*3/uL (ref 0.7–3.1)
Lymphs: 29 %
MCH: 27.5 pg (ref 26.6–33.0)
MCHC: 32.2 g/dL (ref 31.5–35.7)
MCV: 86 fL (ref 79–97)
Monocytes Absolute: 0.4 10*3/uL (ref 0.1–0.9)
Monocytes: 8 %
Neutrophils Absolute: 3.1 10*3/uL (ref 1.4–7.0)
Neutrophils: 60 %
RBC: 4.07 x10E6/uL (ref 3.77–5.28)
RDW: 15.7 % — ABNORMAL HIGH (ref 12.3–15.4)
WBC: 5.2 10*3/uL (ref 3.4–10.8)

## 2017-06-23 LAB — COMPREHENSIVE METABOLIC PANEL
ALT: 11 IU/L (ref 0–32)
AST: 15 IU/L (ref 0–40)
Albumin/Globulin Ratio: 1.3 (ref 1.2–2.2)
Albumin: 3.9 g/dL (ref 3.5–5.5)
Alkaline Phosphatase: 90 IU/L (ref 39–117)
BUN/Creatinine Ratio: 15 (ref 9–23)
BUN: 14 mg/dL (ref 6–24)
Bilirubin Total: <0.2 mg/dL (ref 0.0–1.2)
CO2: 23 mmol/L (ref 20–29)
Calcium: 9.3 mg/dL (ref 8.7–10.2)
Chloride: 103 mmol/L (ref 96–106)
Creatinine, Ser: 0.92 mg/dL (ref 0.57–1.00)
GFR calc Af Amer: 89 mL/min/{1.73_m2} (ref >59)
GFR calc non Af Amer: 78 mL/min/{1.73_m2} (ref >59)
Globulin, Total: 3.1 g/dL (ref 1.5–4.5)
Glucose: 82 mg/dL (ref 65–99)
Potassium: 4.3 mmol/L (ref 3.5–5.2)
Sodium: 141 mmol/L (ref 134–144)
Total Protein: 7 g/dL (ref 6.0–8.5)

## 2017-06-23 LAB — LIPID PANEL WITH LDL/HDL RATIO
Cholesterol, Total: 140 mg/dL (ref 100–199)
HDL: 35 mg/dL — ABNORMAL LOW (ref >39)
LDL Calculated: 70 mg/dL (ref 0–99)
LDl/HDL Ratio: 2 ratio (ref 0.0–3.2)
Triglycerides: 177 mg/dL — ABNORMAL HIGH (ref 0–149)
VLDL Cholesterol Cal: 35 mg/dL (ref 5–40)

## 2017-06-23 LAB — HEMOGLOBIN A1C
Est. average glucose Bld gHb Est-mCnc: 100 mg/dL
Hgb A1c MFr Bld: 5.1 % (ref 4.8–5.6)

## 2017-06-23 LAB — VITAMIN D 25 HYDROXY (VIT D DEFICIENCY, FRACTURES): Vit D, 25-Hydroxy: 54.2 ng/mL (ref 30.0–100.0)

## 2017-06-23 LAB — INSULIN, RANDOM: INSULIN: 25.2 u[IU]/mL — ABNORMAL HIGH (ref 2.6–24.9)

## 2017-06-23 MED FILL — lamoTRIgine 200 MG TABS: 200 | 90 days supply | Qty: 90 | Fill #0

## 2017-06-23 MED FILL — QUETIAPINE FUMARATE 400 MG: 400 | 90 days supply | Qty: 180 | Fill #0

## 2017-06-24 DIAGNOSIS — F4310 Post-traumatic stress disorder, unspecified: Secondary | ICD-10-CM | POA: Diagnosis not present

## 2017-06-30 DIAGNOSIS — F331 Major depressive disorder, recurrent, moderate: Secondary | ICD-10-CM | POA: Diagnosis not present

## 2017-06-30 DIAGNOSIS — F4312 Post-traumatic stress disorder, chronic: Secondary | ICD-10-CM | POA: Diagnosis not present

## 2017-07-01 MED FILL — PANTOPRAZOLE SOD DR 40 MG T: 40 | 30 days supply | Qty: 30 | Fill #0

## 2017-07-08 DIAGNOSIS — F331 Major depressive disorder, recurrent, moderate: Secondary | ICD-10-CM | POA: Diagnosis not present

## 2017-07-08 DIAGNOSIS — F4312 Post-traumatic stress disorder, chronic: Secondary | ICD-10-CM | POA: Diagnosis not present

## 2017-07-13 ENCOUNTER — Ambulatory Visit (INDEPENDENT_AMBULATORY_CARE_PROVIDER_SITE_OTHER): Payer: 59 | Admitting: Family Medicine

## 2017-07-13 ENCOUNTER — Other Ambulatory Visit (INDEPENDENT_AMBULATORY_CARE_PROVIDER_SITE_OTHER): Payer: Self-pay | Admitting: Family Medicine

## 2017-07-13 VITALS — BP 109/73 | HR 76 | Temp 97.9°F | Ht 65.0 in | Wt 255.0 lb

## 2017-07-13 DIAGNOSIS — G4709 Other insomnia: Secondary | ICD-10-CM | POA: Diagnosis not present

## 2017-07-13 DIAGNOSIS — Z6841 Body Mass Index (BMI) 40.0 and over, adult: Secondary | ICD-10-CM | POA: Diagnosis not present

## 2017-07-13 MED ORDER — LIRAGLUTIDE -WEIGHT MANAGEMENT 18 MG/3ML ~~LOC~~ SOPN: 3.0000 mg | PEN_INJECTOR | Freq: Every day | SUBCUTANEOUS | 0 refills | Status: DC

## 2017-07-13 NOTE — Progress Notes (Signed)
Office: [phone]  /  Fax: [phone]   HPI:   Chief Complaint: OBESITY Caroline Gonzales is here to discuss her progress with her obesity treatment plan. Caroline Gonzales is on the Category 3 plan and is following her eating plan approximately 70 % of the time. Caroline Gonzales states Caroline Gonzales is exercising 0 minutes 0 times per week. Caroline Gonzales has done well with weight loss, watching calories, and protein but not journaling. Her hunger is controlled but Caroline Gonzales is struggling with temptations at work.  Her weight is 255 lb (115.7 kg) today and has had a weight loss of 3 pounds over a period of 2 to 3 weeks since her last visit. Caroline Gonzales has lost 23 lbs since starting treatment with [name].  Insomnia Caroline Gonzales is decreasing Seroquel per her psychiatrist and Caroline Gonzales notes increased insomnia with decreased dose. Caroline Gonzales recognizes this should improves as her system acclimates.   ALLERGIES: Allergies  Allergen Reactions  . Tamiflu [Oseltamivir Phosphate] Anaphylaxis  . Cephalosporins Rash  . Latex Rash  . Penicillins Rash    MEDICATIONS: Current Outpatient Prescriptions on File Prior to Visit  Medication Sig Dispense Refill  . Cholecalciferol (VITAMIN D3) 5000 units CAPS Take 5,000 Units by mouth at bedtime.    . clonazePAM (KLONOPIN) 1 MG tablet Take 1 mg by mouth 2 (two) times daily as needed for anxiety. Taking 0.5mg  1-2 tabs BID prn    . ibuprofen (ADVIL,MOTRIN) 200 MG tablet Take 400-600 mg by mouth daily as needed for headache or moderate pain.    . Insulin Pen Needle 31G X 6 MM MISC 1 Package by Does not apply route every morning. 100 each 0  . L-methylfolate Calcium 15 MG TABS Take 15 m by mouth daily.    [name] lamoTRIgine (LAMICTAL) 200 MG tablet Take 200 mg by mouth at bedtime.    [name] levonorgestrel (MIRENA) 20 MCG/24HR IUD 1 each by Intrauterine route once.    . Liraglutide -Weight Management (SAXENDA) 18 MG/3ML SOPN Inject 3 mg into the skin daily. (Patient taking differently: Inject 3 mg into the skin daily. 1.8mg  daily) 5 pen 0  .  loratadine (CLARITIN) 10 MG tablet Take 10 mg by mouth at bedtime.     . Multiple Vitamin (MULTIVITAMIN) tablet Take 1 tablet by mouth Nightly.    . propranolol (INDERAL) 40 MG tablet Take 40 mg by mouth 2 (two) times daily.     . QUEtiapine (SEROQUEL) 400 MG tablet Take 400 mg by mouth at bedtime.      No current facility-administered medications on file prior to visit.     PAST MEDICAL HISTORY: Past Medical History:  Diagnosis Date  . Allergy    Zyrtec, Allegra.  . Anxiety    followed by Dr. Toy Cookey  . Back pain   . Bipolar 1 disorder ([place])   . Complication of anesthesia   . Depression   . Fibroid   . Gallbladder problem   . Gastroparesis 09/14/2012   gastric emptying study in 2014  . GERD (gastroesophageal reflux disease)   . Pneumonia    2013ish  . PONV (postoperative nausea and vomiting)   . Pre-diabetes   . PTSD (post-traumatic stress disorder)   . Vitamin D deficiency     PAST SURGICAL HISTORY: Past Surgical History:  Procedure Laterality Date  . CESAREAN SECTION    . CHOLECYSTECTOMY    . DILATION AND CURETTAGE OF UTERUS    . FOREIGN BODY REMOVAL Left 11/18/2016   Procedure: REMOVAL FOREIGN BODY EXTREMITY LEFT FOOT;  Surgeon:  Trula Slade, DPM;  Location: [facility];  Service: Podiatry;  Laterality: Left;  . PILONIDAL CYST EXCISION  1990's  . TENDON REPAIR Left    Left Ankle  . WISDOM TOOTH EXTRACTION  19090's    SOCIAL HISTORY: Social History  Substance Use Topics  . Smoking status: Former Smoker    Years: 10.00    Types: Cigarettes    Quit date: 2003  . Smokeless tobacco: Never Used  . Alcohol use 0.0 oz/week     Comment: 1-2 per month     FAMILY HISTORY: Family History  Problem Relation Age of Onset  . Cancer Mother        squamous cell carcinoma  . Hyperlipidemia Mother   . Depression Mother   . Anxiety disorder Mother   . Obesity Mother   . Heart disease Father        cardiomegaly, CHF  . Hyperlipidemia Father   . Hypertension Father   .  Mental retardation Father   . High blood pressure Father   . Depression Father   . Anxiety disorder Father   . Obesity Father   . Cancer Maternal Grandmother   . Diabetes Maternal Grandmother   . Heart disease Maternal Grandmother   . Hyperlipidemia Maternal Grandmother   . Hypertension Maternal Grandmother   . Stroke Maternal Grandmother   . Heart disease Paternal Grandmother   . Hypertension Paternal Grandmother   . Heart disease Paternal Grandfather   . Hyperlipidemia Paternal Grandfather   . Mental illness Paternal Grandfather   . Breast cancer Maternal Aunt   . Thyroid cancer Paternal Aunt     ROS: Review of Systems  Constitutional: Positive for weight loss.  Psychiatric/Behavioral: The patient has insomnia.     PHYSICAL EXAM: Blood pressure 109/73, pulse 76, temperature 97.9 F (36.6 C), temperature source Oral, height 5\' 5"  (1.651 m), weight 255 lb (115.7 kg), SpO2 98 %. Body mass index is 42.43 kg/m. Physical Exam  Constitutional: Caroline Gonzales is oriented to person, place, and time. Caroline Gonzales appears well-developed and well-nourished.  Cardiovascular: Normal rate.   Pulmonary/Chest: Effort normal.  Musculoskeletal: Normal range of motion.  Neurological: Caroline Gonzales is oriented to person, place, and time.  Skin: Skin is warm and dry.  Psychiatric: Caroline Gonzales has a normal mood and affect. Her behavior is normal.  Vitals reviewed.   RECENT LABS AND TESTS: BMET    Component Value Date/Time   NA 141 06/22/2017 0904   K 4.3 06/22/2017 0904   CL 103 06/22/2017 0904   CO2 23 06/22/2017 0904   GLUCOSE 82 06/22/2017 0904   GLUCOSE 87 06/27/2016 0933   BUN 14 06/22/2017 0904   CREATININE 0.92 06/22/2017 0904   CREATININE 0.95 06/27/2016 0933   CALCIUM 9.3 06/22/2017 0904   GFRNONAA 78 06/22/2017 0904   GFRAA 89 06/22/2017 0904   Lab Results  Component Value Date   HGBA1C 5.1 06/22/2017   HGBA1C 5.3 01/11/2017   HGBA1C 5.7 (H) 08/31/2016   HGBA1C 5.6 12/01/2015   Lab Results    Component Value Date   INSULIN 25.2 (H) 06/22/2017   INSULIN 17.0 01/11/2017   INSULIN 17.4 08/31/2016   CBC    Component Value Date/Time   WBC 5.2 06/22/2017 0904   WBC 8.1 06/27/2016 0941   WBC 9.9 04/30/2016 0947   RBC 4.07 06/22/2017 0904   RBC 3.96 (A) 06/27/2016 0941   RBC 4.15 04/30/2016 0947   HGB 11.2 06/22/2017 0904   HCT 34.8 06/22/2017 0904  PLT 332 11/12/2016 1651   MCV 86 06/22/2017 0904   MCH 27.5 06/22/2017 0904   MCH 29.0 06/27/2016 0941   MCH 28.2 04/30/2016 0947   MCHC 32.2 06/22/2017 0904   MCHC 34.5 06/27/2016 0941   MCHC 33.2 04/30/2016 0947   RDW 15.7 (H) 06/22/2017 0904   LYMPHSABS 1.5 06/22/2017 0904   MONOABS 891 04/30/2016 0947   EOSABS 0.2 06/22/2017 0904   BASOSABS 0.0 06/22/2017 0904   Iron/TIBC/Ferritin/ %Sat    Component Value Date/Time   IRON 53 08/31/2016 1644   TIBC 280 08/31/2016 1644   FERRITIN 36 08/31/2016 1644   IRONPCTSAT 19 08/31/2016 1644   Lipid Panel     Component Value Date/Time   CHOL 140 06/22/2017 0904   TRIG 177 (H) 06/22/2017 0904   HDL 35 (L) 06/22/2017 0904   CHOLHDL 4.1 12/01/2015 0914   VLDL 37 (H) 12/01/2015 0914   LDLCALC 70 06/22/2017 0904   Hepatic Function Panel     Component Value Date/Time   PROT 7.0 06/22/2017 0904   ALBUMIN 3.9 06/22/2017 0904   AST 15 06/22/2017 0904   ALT 11 06/22/2017 0904   ALKPHOS 90 06/22/2017 0904   BILITOT <0.2 06/22/2017 0904      Component Value Date/Time   TSH 1.620 08/31/2016 1644   TSH 1.83 12/01/2015 0914    ASSESSMENT AND PLAN: Other insomnia  Class 3 severe obesity with serious comorbidity and body mass index (BMI) of 40.0 to 44.9 in adult, unspecified obesity type ([place]) - Plan: Liraglutide -Weight Management (SAXENDA) 18 MG/3ML SOPN  PLAN:  Insomnia We have discussed sleep hypnosis technology to help improve sleep quality. Romie Minus agrees to follow up with our clinic in 3 weeks.  Obesity Rucha is currently in the action stage of change. As  such, her goal is to continue with weight loss efforts Caroline Gonzales has agreed to change to portion control better and make smarter food choices, such as increase vegetables and decrease simple carbohydrates  Francene has been instructed to work up to a goal of 150 minutes of combined cardio and strengthening exercise per week for weight loss and overall health benefits. We discussed the following Behavioral Modification Strategies today: increasing lean protein intake, decreasing simple carbohydrates, dealing with family or coworker sabotage, holiday eating strategies, and better snacking choices We discussed various medication options to help Lark with her weight loss efforts and we both agreed to continue saxenda and we will refill for 1 month  Brizeyda has agreed to follow up with our clinic in 3 weeks. Caroline Gonzales was informed of the importance of frequent follow up visits to maximize her success with intensive lifestyle modifications for her multiple health conditions.  I, Trixie Dredge, am acting as transcriptionist for Dennard Nip, MD  I have reviewed the above documentation for accuracy and completeness, and I agree with the above. -Dennard Nip, MD      Today's visit was # 20 out of 22.  Starting weight: 278 lbs Starting date: 08/31/16 Today's weight : 255 lbs  Today's date: 07/13/2017 Total lbs lost to date: 23 (Patients must lose 7 lbs in the first 6 months to continue with counseling)   ASK: We discussed the diagnosis of obesity with Caroline Gonzales today and Maiya agreed to give [name] permission to discuss obesity behavioral modification therapy today.  ASSESS: Cade has the diagnosis of obesity and her BMI today is 42.43 Roe is in the action stage of change   ADVISE: Ishita was educated on  the multiple health risks of obesity as well as the benefit of weight loss to improve her health. Caroline Gonzales was advised of the need for long term treatment and the importance of lifestyle  modifications.  AGREE: Multiple dietary modification options and treatment options were discussed and  Elias agreed to portion control better and make smarter food choices, such as increase vegetables and decrease simple carbohydrates  We discussed the following Behavioral Modification Strategies today: increasing lean protein intake, decreasing simple carbohydrates, dealing with family or coworker sabotage, holiday eating strategies, and better snacking choices

## 2017-07-14 ENCOUNTER — Ambulatory Visit (INDEPENDENT_AMBULATORY_CARE_PROVIDER_SITE_OTHER): Payer: 59 | Admitting: Physician Assistant

## 2017-07-15 DIAGNOSIS — F4312 Post-traumatic stress disorder, chronic: Secondary | ICD-10-CM | POA: Diagnosis not present

## 2017-07-15 DIAGNOSIS — F331 Major depressive disorder, recurrent, moderate: Secondary | ICD-10-CM | POA: Diagnosis not present

## 2017-07-19 ENCOUNTER — Encounter (INDEPENDENT_AMBULATORY_CARE_PROVIDER_SITE_OTHER): Payer: Self-pay

## 2017-07-20 ENCOUNTER — Ambulatory Visit: Payer: 59 | Admitting: Obstetrics and Gynecology

## 2017-07-20 ENCOUNTER — Encounter: Payer: Self-pay | Admitting: Obstetrics and Gynecology

## 2017-07-20 ENCOUNTER — Telehealth: Payer: Self-pay | Admitting: Obstetrics and Gynecology

## 2017-07-20 VITALS — BP 122/70 | HR 76 | Resp 16 | Wt 259.0 lb

## 2017-07-20 DIAGNOSIS — F331 Major depressive disorder, recurrent, moderate: Secondary | ICD-10-CM | POA: Diagnosis not present

## 2017-07-20 DIAGNOSIS — N764 Abscess of vulva: Secondary | ICD-10-CM | POA: Diagnosis not present

## 2017-07-20 DIAGNOSIS — F4312 Post-traumatic stress disorder, chronic: Secondary | ICD-10-CM | POA: Diagnosis not present

## 2017-07-20 MED ORDER — SULFAMETHOXAZOLE-TRIMETHOPRIM 800-160 MG PO TABS: 1.0000 | ORAL_TABLET | Freq: Two times a day (BID) | ORAL | 0 refills | Status: DC

## 2017-07-20 MED FILL — SULFAMETHOXAZOLE/TMP DS TAB: 800-160 | 7 days supply | Qty: 14 | Fill #0

## 2017-07-20 NOTE — Telephone Encounter (Signed)
Spoke with patient. Patient states that the day before yesterday she began to notice a lump on her labia that was tender. Lump has increased in size. States it is very painful today and she cannot sit down. Patient is unable to see the lump. Advised will need to be seen for further evaluation. Patient is agreeable Aware Dr.Jertson is in the OR today. Willing to see Dr.Silva. Appointment scheduled for today at 12:25 pm with Dr.Silva.  After review with Lamont Snowball, RN patient will need to be seen at 12:45 pm with Dr.Silva.  Spoke with patient. Patient will arrive at 12:30 pm for 12:45 pm appointment.  Cc: Dr.Jertson  Routing to covering provider for final review. Patient agreeable to disposition. Will close encounter.

## 2017-07-20 NOTE — Progress Notes (Addendum)
GYNECOLOGY  VISIT   HPI: 41 y.o.   Married  Caucasian  female   [phone] with No LMP recorded. Patient is not currently having periods (Reason: IUD).   here c/o painful bump on labia    Did warm compresses last night.  Difficulty to sitting due to pain.  No drainage.  No fevers.  Does not feel well.   Had a buttock abscess in the past.  No MRSA.   No hx Bartholin's gland abscess.   GYNECOLOGIC HISTORY: No LMP recorded. Patient is not currently having periods (Reason: IUD). Contraception:  IUD (Mirena)         OB History    Gravida Para Term Preterm AB Living   3 3 3     3    SAB TAB Ectopic Multiple Live Births           3         Patient Active Problem List   Diagnosis Date Noted  . Bipolar depression ([place]) 04/27/2017  . Class 3 obesity with serious comorbidity and body mass index (BMI) of 40.0 to 44.9 in adult 04/27/2017  . Residual foreign body in soft tissue 11/12/2016  . Prediabetes 11/02/2016  . Morbid obesity ([place]) 10/15/2016  . Bipolar 1 disorder ([place])     Past Medical History:  Diagnosis Date  . Allergy    Zyrtec, Allegra.  . Anxiety    followed by Dr. Toy Cookey  . Back pain   . Bipolar 1 disorder ([place])   . Complication of anesthesia   . Depression   . Fibroid   . Gallbladder problem   . Gastroparesis 09/14/2012   gastric emptying study in 2014  . GERD (gastroesophageal reflux disease)   . Pneumonia    2013ish  . PONV (postoperative nausea and vomiting)   . Pre-diabetes   . PTSD (post-traumatic stress disorder)   . Vitamin D deficiency     Past Surgical History:  Procedure Laterality Date  . CESAREAN SECTION    . CHOLECYSTECTOMY    . DILATION AND CURETTAGE OF UTERUS    . PILONIDAL CYST EXCISION  1990's  . TENDON REPAIR Left    Left Ankle  . WISDOM TOOTH EXTRACTION  19090's    Current Outpatient Medications  Medication Sig Dispense Refill  . Cholecalciferol (VITAMIN D3) 5000 units CAPS Take 5,000 Units by mouth at bedtime.    . clonazePAM  (KLONOPIN) 1 MG tablet Take 1 mg by mouth 2 (two) times daily as needed for anxiety. Taking 0.5mg  1-2 tabs BID prn    . ibuprofen (ADVIL,MOTRIN) 200 MG tablet Take 400-600 mg by mouth daily as needed for headache or moderate pain.    . Insulin Pen Needle 31G X 6 MM MISC 1 Package by Does not apply route every morning. 100 each 0  . L-methylfolate Calcium 15 MG TABS Take 15 m by mouth daily.    [name] lamoTRIgine (LAMICTAL) 200 MG tablet Take 200 mg by mouth at bedtime.    [name] levonorgestrel (MIRENA) 20 MCG/24HR IUD 1 each by Intrauterine route once.    . Liraglutide -Weight Management (SAXENDA) 18 MG/3ML SOPN Inject 3 mg into the skin daily. 5 pen 0  . loratadine (CLARITIN) 10 MG tablet Take 10 mg by mouth at bedtime.     . Multiple Vitamin (MULTIVITAMIN) tablet Take 1 tablet by mouth Nightly.    . propranolol (INDERAL) 40 MG tablet Take 40 mg by mouth 2 (two) times daily.     . QUEtiapine (  SEROQUEL) 400 MG tablet Take 400 mg by mouth at bedtime.     . sulfamethoxazole-trimethoprim (BACTRIM DS) 800-160 MG tablet Take 1 tablet 2 (two) times daily by mouth. One PO BID x 7 days 14 tablet 0   No current facility-administered medications for this visit.      ALLERGIES: Tamiflu [oseltamivir phosphate]; Cephalosporins; Latex; and Penicillins  Family History  Problem Relation Age of Onset  . Cancer Mother        squamous cell carcinoma  . Hyperlipidemia Mother   . Depression Mother   . Anxiety disorder Mother   . Obesity Mother   . Heart disease Father        cardiomegaly, CHF  . Hyperlipidemia Father   . Hypertension Father   . Mental retardation Father   . High blood pressure Father   . Depression Father   . Anxiety disorder Father   . Obesity Father   . Cancer Maternal Grandmother   . Diabetes Maternal Grandmother   . Heart disease Maternal Grandmother   . Hyperlipidemia Maternal Grandmother   . Hypertension Maternal Grandmother   . Stroke Maternal Grandmother   . Heart disease Paternal  Grandmother   . Hypertension Paternal Grandmother   . Heart disease Paternal Grandfather   . Hyperlipidemia Paternal Grandfather   . Mental illness Paternal Grandfather   . Breast cancer Maternal Aunt   . Thyroid cancer Paternal Aunt     Social History   Socioeconomic History  . Marital status: Married    Spouse name: Not on file  . Number of children: Not on file  . Years of education: Not on file  . Highest education level: Not on file  Social Needs  . Financial resource strain: Not on file  . Food insecurity - worry: Not on file  . Food insecurity - inability: Not on file  . Transportation needs - medical: Not on file  . Transportation needs - non-medical: Not on file  Occupational History  . Occupation: Programmer, multimedia: [employer]  Tobacco Use  . Smoking status: Former Smoker    Years: 10.00    Types: Cigarettes    Last attempt to quit: 2003    Years since quitting: 15.8  . Smokeless tobacco: Never Used  Substance and Sexual Activity  . Alcohol use: Yes    Alcohol/week: 0.0 oz    Comment: 1-2 per month   . Drug use: No  . Sexual activity: Yes    Partners: Male    Birth control/protection: IUD  Other Topics Concern  . 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      Seatbelt: 100%; no texting while driving often        ROS:  Pertinent items are noted in HPI.  PHYSICAL EXAMINATION:    BP 122/70 (BP Location: Right Arm, Patient Position: Sitting, Cuff Size: Normal)   Pulse 76   Resp 16   Wt 259 lb (117.5 kg)   BMI 43.10 kg/m     General appearance: alert,  cooperative and appears stated age   Pelvic: External genitalia: left labia  majora with 8 mm tender abscess coming to a head.              Urethra:  normal appearing urethra with no masses, tenderness or lesions              Bartholins and Skenes: normal                 Vagina: normal appearing vagina with normal color and discharge, no lesions              Cervix: no lesions.  IUD strings noted.                 Bimanual Exam:  Uterus:  normal size, contour, position, consistency, mobility, non-tender              Adnexa: no mass, fullness, tenderness             Chaperone was present for exam.  ASSESSMENT  Small left vulvar abscess. Possible infected sebaceous cyst.   PLAN  No drainage needed today.  Bactrim DS po bid x 7 days.  She has Diflucan at home already and will take 150 mg for itching or burning and then do a second dose at the end of the abx if needed. Return for increasing size, pain, or fever.  Follow up prn.   An After Visit Summary was printed and given to the patient.  ___15___ minutes face to face time of which over 50% was spent in counseling.

## 2017-07-20 NOTE — Patient Instructions (Signed)
Skin Abscess A skin abscess is an infected area on or under your skin that contains pus and other material. An abscess can happen almost anywhere on your body. Some abscesses break open (rupture) on their own. Most continue to get worse unless they are treated. The infection can spread deeper into the body and into your blood, which can make you feel sick. Treatment usually involves draining the abscess. Follow these instructions at home: Abscess Care  If you have an abscess that has not drained, place a warm, clean, wet washcloth over the abscess several times a day. Do this as told by your doctor.  Follow instructions from your doctor about how to take care of your abscess. Make sure you: ? Cover the abscess with a bandage (dressing). ? Change your bandage or gauze as told by your doctor. ? Wash your hands with soap and water before you change the bandage or gauze. If you cannot use soap and water, use hand sanitizer.  Check your abscess every day for signs that the infection is getting worse. Check for: ? More redness, swelling, or pain. ? More fluid or blood. ? Warmth. ? More pus or a bad smell. Medicines   Take over-the-counter and prescription medicines only as told by your doctor.  If you were prescribed an antibiotic medicine, take it as told by your doctor. Do not stop taking the antibiotic even if you start to feel better. General instructions  To avoid spreading the infection: ? Do not share personal care items, towels, or hot tubs with others. ? Avoid making skin-to-skin contact with other people.  Keep all follow-up visits as told by your doctor. This is important. Contact a doctor if:  You have more redness, swelling, or pain around your abscess.  You have more fluid or blood coming from your abscess.  Your abscess feels warm when you touch it.  You have more pus or a bad smell coming from your abscess.  You have a fever.  Your muscles ache.  You have  chills.  You feel sick. Get help right away if:  You have very bad (severe) pain.  You see red streaks on your skin spreading away from the abscess. This information is not intended to replace advice given to you by your health care provider. Make sure you discuss any questions you have with your health care provider. Document Released: 02/17/2008 Document Revised: 04/26/2016 Document Reviewed: 07/10/2015 Elsevier Interactive Patient Education  2018 [employer]  

## 2017-07-20 NOTE — Telephone Encounter (Signed)
Patient has a lump on her labia that is swollen. She states she cannot sit down it is very painful.

## 2017-07-22 ENCOUNTER — Ambulatory Visit (INDEPENDENT_AMBULATORY_CARE_PROVIDER_SITE_OTHER): Payer: 59 | Admitting: Neurology

## 2017-07-22 ENCOUNTER — Encounter: Payer: Self-pay | Admitting: Neurology

## 2017-07-22 VITALS — BP 118/78 | HR 100 | Ht 65.0 in | Wt 260.0 lb

## 2017-07-22 DIAGNOSIS — G478 Other sleep disorders: Secondary | ICD-10-CM | POA: Diagnosis not present

## 2017-07-22 DIAGNOSIS — G479 Sleep disorder, unspecified: Secondary | ICD-10-CM

## 2017-07-22 DIAGNOSIS — F513 Sleepwalking [somnambulism]: Secondary | ICD-10-CM

## 2017-07-22 DIAGNOSIS — Z6841 Body Mass Index (BMI) 40.0 and over, adult: Secondary | ICD-10-CM | POA: Diagnosis not present

## 2017-07-22 DIAGNOSIS — R0683 Snoring: Secondary | ICD-10-CM

## 2017-07-22 NOTE — Patient Instructions (Signed)
Thank you for choosing [facility] for your sleep related care! It was nice to meet you today! I appreciate that you entrust me with your sleep related healthcare concerns. I hope, I was able to address at least some of your concerns today, and that I can help you feel reassured and also get better.    Here is what we discussed today and what we came up with as our plan for you:    Based on your symptoms and your exam I believe you are at risk for obstructive sleep apnea or OSA, and I think we should proceed with a sleep study to determine whether you do or do not have OSA and how severe it is. If you have more than mild OSA, I want you to consider treatment with CPAP. Please remember, the risks and ramifications of moderate to severe obstructive sleep apnea or OSA are: Cardiovascular disease, including congestive heart failure, stroke, difficult to control hypertension, arrhythmias, and even type 2 diabetes has been linked to untreated OSA. Sleep apnea causes disruption of sleep and sleep deprivation in most cases, which, in turn, can cause recurrent headaches, problems with memory, mood, concentration, focus, and vigilance. Most people with untreated sleep apnea report excessive daytime sleepiness, which can affect their ability to drive. Please do not drive if you feel sleepy.   I will likely see you back after your sleep study to go over the test results and where to go from there. We will call you after your sleep study to advise about the results (most likely, you will hear from Kristen, my nurse) and to set up an appointment at the time, as necessary.    Our sleep lab administrative assistant, Dawn will call you to schedule your sleep study. If you don't hear back from her by about 2 weeks from now, please feel free to call her at [phone]. You can leave a message with your phone number and concerns, if you get the voicemail box. She will call back as soon as possible.   

## 2017-07-22 NOTE — Progress Notes (Signed)
Subjective:    Patient ID: Caroline Gonzales is a 41 y.o. female.  HPI      Star Age, MD, PhD [facility] [address] [place], [place] [address]  Dear Gwynneth Albright,   I saw your patient, Caroline Gonzales, upon your kind request in my neurologic clinic today for initial consultation of her sleep disorder, in particular, concern for underlying obstructive sleep apnea. The patient is unaccompanied today. As you know, Caroline Gonzales is a 41 year old right-handed woman with an underlying medical history of prediabetes, vitamin D deficiency, allergies, anxiety, back pain, mood disorder including anxiety, depression, PTSD per chart review, history of pneumonia in the past, former smoking and morbid obesity with a BMI of over 40, who reports snoring and excessive daytime somnolence. I reviewed your office note from 06/22/2017. Her main complaint is tiredness during the day. She has been on sedating medications and is in the process of adjusting medication. She is now down to 100 mg on her Seroquel, previously on 800 mg each night. She does take clonazepam at night usually 1 mg, up to twice daily for anxiety. She does report stress. She has 2 younger children, and fourth and fifth grade, she works as an Therapist, sports, she is also in grad school. She denies nighttime nocturia or morning headaches or restless leg symptoms. She is a restless sleeper. Her husband has recently been diagnosed with sleep apnea and sleeps much better now which is reassuring to her. She reports claustrophobia and feels like she would not be able to use a CPAP machine. Bedtime is around 9 and she is usually asleep by 10. She does not always sleep through the night. Wake up time is around 6:30 AM. She has a history of sleep talking and history of rare sleepwalking episodes. She reports that she had a sleep study several years ago, results are not available. She does report weight loss and reduction in her  snoring since her last sleep study. Her Epworth sleepiness score is 1 out of 24, fatigue score is 22 out of 63. She is married and lives with her husband and 2 children she quit smoking in 2002. She drinks alcohol occasionally, about once a month, she drinks caffeine in the form of diet soda, usually once per day.  Her Past Medical History Is Significant For: Past Medical History:  Diagnosis Date  . Allergy    Zyrtec, Allegra.  . Anxiety    followed by Dr. Toy Cookey  . Back pain   . Bipolar 1 disorder ([place])   . Complication of anesthesia   . Depression   . Fibroid   . Gallbladder problem   . Gastroparesis 09/14/2012   gastric emptying study in 2014  . GERD (gastroesophageal reflux disease)   . Pneumonia    2013ish  . PONV (postoperative nausea and vomiting)   . Pre-diabetes   . PTSD (post-traumatic stress disorder)   . Vitamin D deficiency     Her Past Surgical History Is Significant For: Past Surgical History:  Procedure Laterality Date  . CESAREAN SECTION    . CHOLECYSTECTOMY    . DILATION AND CURETTAGE OF UTERUS    . PILONIDAL CYST EXCISION  1990's  . TENDON REPAIR Left    Left Ankle  . WISDOM TOOTH EXTRACTION  19090's    Her Family History Is Significant For: Family History  Problem Relation Age of Onset  . Cancer Mother        squamous cell carcinoma  .  Hyperlipidemia Mother   . Depression Mother   . Anxiety disorder Mother   . Obesity Mother   . Heart disease Father        cardiomegaly, CHF  . Hyperlipidemia Father   . Hypertension Father   . Mental retardation Father   . High blood pressure Father   . Depression Father   . Anxiety disorder Father   . Obesity Father   . Cancer Maternal Grandmother   . Diabetes Maternal Grandmother   . Heart disease Maternal Grandmother   . Hyperlipidemia Maternal Grandmother   . Hypertension Maternal Grandmother   . Stroke Maternal Grandmother   . Heart disease Paternal Grandmother   . Hypertension Paternal Grandmother    . Heart disease Paternal Grandfather   . Hyperlipidemia Paternal Grandfather   . Mental illness Paternal Grandfather   . Breast cancer Maternal Aunt   . Thyroid cancer Paternal Aunt     Her Social History Is Significant For: Social History   Socioeconomic History  . Marital status: Married    Spouse name: None  . Number of children: None  . Years of education: None  . Highest education level: None  Social Needs  . Financial resource strain: None  . Food insecurity - worry: None  . Food insecurity - inability: None  . Transportation needs - medical: None  . Transportation needs - non-medical: None  Occupational History  . Occupation: Programmer, multimedia: [employer]  Tobacco Use  . Smoking status: Former Smoker    Years: 10.00    Types: Cigarettes    Last attempt to quit: 2003    Years since quitting: 15.8  . Smokeless tobacco: Never Used  Substance and Sexual Activity  . Alcohol use: Yes    Alcohol/week: 0.0 oz    Comment: 1-2 per month   . Drug use: No  . Sexual activity: Yes    Partners: Male    Birth control/protection: IUD  Other Topics Concern  . 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      Seatbelt: 100%; no texting while driving often        Her Allergies Are:  Allergies  Allergen Reactions  . Tamiflu [Oseltamivir Phosphate] Anaphylaxis  . Cephalosporins Rash  . Latex Rash  . Penicillins Rash  :   Her Current Medications Are:  Outpatient Encounter Medications as of 07/22/2017  Medication Sig  . Cholecalciferol (VITAMIN D3) 5000 units CAPS Take 5,000 Units by mouth at bedtime.  . clonazePAM (KLONOPIN) 1 MG tablet Take 1 mg by mouth 2  (two) times daily as needed for anxiety. Taking 0.5mg  1-2 tabs BID prn  . ibuprofen (ADVIL,MOTRIN) 200 MG tablet Take 400-600 mg by mouth daily as needed for headache or moderate pain.  . Insulin Pen Needle 31G X 6 MM MISC 1 Package by Does not apply route every morning.  . L-methylfolate Calcium 15 MG TABS Take 15 m by mouth daily.  [name] lamoTRIgine (LAMICTAL) 200 MG tablet Take 200 mg by mouth at bedtime.  [name]  levonorgestrel (MIRENA) 20 MCG/24HR IUD 1 each by Intrauterine route once.  . Liraglutide -Weight Management (SAXENDA) 18 MG/3ML SOPN Inject 3 mg into the skin daily.  [name] loratadine (CLARITIN) 10 MG tablet Take 10 mg by mouth at bedtime.   . Multiple Vitamin (MULTIVITAMIN) tablet Take 1 tablet by mouth Nightly.  . propranolol (INDERAL) 40 MG tablet Take 40 mg by mouth 2 (two) times daily.   . QUEtiapine (SEROQUEL) 400 MG tablet Take 100 mg at bedtime by mouth.   . sulfamethoxazole-trimethoprim (BACTRIM DS) 800-160 MG tablet Take 1 tablet 2 (two) times daily by mouth. One PO BID x 7 days   No facility-administered encounter medications on file as of 07/22/2017.   :  Review of Systems:  Out of a complete 14 point review of systems, all are reviewed and negative with the exception of these symptoms as listed below:  Review of Systems  Neurological:       Pt presents today to discuss her sleep. Pt had a sleep study in [facility] about 5-6 years ago but no results are available. Pt has never been on a cpap. Pt reports a decrease in snoring and weight since the last sleep study.  Epworth Sleepiness Scale 0= would never doze 1= slight chance of dozing 2= moderate chance of dozing 3= high chance of dozing  Sitting and reading: 0 Watching TV: 0 Sitting inactive in a public place (ex. Theater or meeting): 0 As a passenger in a car for an hour without a break: 0 Lying down to rest in the afternoon: 1 Sitting and talking to someone: 0 Sitting quietly after lunch (no alcohol): 0 In a car,  while stopped in traffic: 0 Total: 1     Objective:  Neurological Exam  Physical Exam Physical Examination:   Vitals:   07/22/17 0904  BP: 118/78  Pulse: 100    General Examination: The patient is a very pleasant 41 y.o. female in no acute distress. She appears well-developed and well-nourished and well groomed.   HEENT: Normocephalic, atraumatic, pupils are equal, round and reactive to light and accommodation. Funduscopic exam is normal with sharp disc margins noted. She wears corrective eyeglasses. Extraocular tracking is good without limitation to gaze excursion or nystagmus noted. Normal smooth pursuit is noted. Hearing is grossly intact. Face is symmetric with normal facial animation and normal facial sensation. Speech is clear with no dysarthria noted. There is no hypophonia. There is no lip, neck/head, jaw or voice tremor. Neck is supple with full range of passive and active motion. There are no carotid bruits on auscultation. Oropharynx exam reveals: mild mouth dryness, good dental hygiene and mild airway crowding, due to  smaller airway entry. Mallampati is class I. Tongue protrudes centrally and palate elevates symmetrically. Tonsils are small. Neck size is 15 1/8 inches. She has a Mild overbite.   Chest: Clear to auscultation without wheezing, rhonchi or crackles noted.  Heart: S1+S2+0, regular and normal without murmurs, rubs or gallops noted.   Abdomen: Soft, non-tender and non-distended with normal bowel sounds appreciated on auscultation.  Extremities: There is no pitting edema in the distal lower extremities bilaterally. Pedal pulses are intact.  Skin: Warm and dry without trophic changes noted.  Musculoskeletal: exam reveals no obvious joint deformities, tenderness or joint swelling or erythema.   Neurologically:  Mental status: The patient is awake, alert and oriented in all 4 spheres. Her immediate and remote memory, attention, language skills and fund of knowledge  are appropriate. There is  no evidence of aphasia, agnosia, apraxia or anomia. Speech is clear with normal prosody and enunciation. Thought process is linear. Mood is normal and affect is normal.  Cranial nerves II - XII are as described above under HEENT exam. In addition: shoulder shrug is normal with equal shoulder height noted. Motor exam: Normal bulk, strength and tone is noted. There is no drift, tremor or rebound. Romberg is negative. Reflexes are 1+ throughout. Fine motor skills and coordination: intact with normal finger taps, normal hand movements, normal rapid alternating patting, normal foot taps and normal foot agility.  Cerebellar testing: No dysmetria or intention tremor on finger to nose testing. Heel to shin is unremarkable bilaterally. There is no truncal or gait ataxia.  Sensory exam: intact to light touch in the upper and lower extremities.  Gait, station and balance: She stands easily. No veering to one side is noted. No leaning to one side is noted. Posture is age-appropriate and stance is narrow based. Gait shows normal stride length and normal pace. No problems turning are noted. Tandem walk is unremarkable.   Assessment and Plan:   In summary, ILSE BILLMAN is a very pleasant 41 y.o.-year old female with an underlying medical history of prediabetes, vitamin D deficiency, allergies, anxiety, back pain, mood disorder including anxiety, depression, PTSD per chart review, history of pneumonia in the past, former smoking and morbid obesity with a BMI of over 40, whose history and physical exam are concerning for obstructive sleep apnea (OSA). I had a long chat with the patient about my findings and the diagnosis of OSA, its prognosis and treatment options. We talked about medical treatments, surgical interventions and non-pharmacological approaches. I explained in particular the risks and ramifications of untreated moderate to severe OSA, especially with respect to developing  cardiovascular disease down the Road, including congestive heart failure, difficult to treat hypertension, cardiac arrhythmias, or stroke. Even type 2 diabetes has, in part, been linked to untreated OSA. Symptoms of untreated OSA include daytime sleepiness, memory problems, mood irritability and mood disorder such as depression and anxiety, lack of energy, as well as recurrent headaches, especially morning headaches. We talked about trying to maintain a healthy lifestyle in general, as well as the importance of weight control. I encouraged the patient to eat healthy, exercise daily and keep well hydrated, to keep a scheduled bedtime and wake time routine, to not skip any meals and eat healthy snacks in between meals. I advised the patient not to drive when feeling sleepy. I recommended the following at this time: sleep study with potential positive airway pressure titration. (We will score hypopneas at 3%).   I explained the sleep test procedure to the patient and also outlined possible surgical and non-surgical treatment options of OSA, including the use of a custom-made dental device (which would require a referral to a specialist dentist or oral surgeon), upper airway surgical options, such as pillar implants, radiofrequency surgery, tongue base surgery, and UPPP (which would involve a referral to an ENT surgeon). Rarely, jaw surgery such as mandibular advancement may be considered.  I also explained the CPAP treatment option to the patient, who indicated that she would be willing to try CPAP if the need arises. I explained the importance of being compliant with PAP treatment, not only for insurance purposes but primarily to improve Her symptoms, and for the patient's long term health benefit, including to reduce Her cardiovascular risks. I answered all her questions today and the patient was in agreement. I would  like to see her back after the sleep study is completed and encouraged her to call with any  interim questions, concerns, problems or updates.   Thank you very much for allowing me to participate in the care of this nice patient. If I can be of any further assistance to you please do not hesitate to call me at [phone].  Sincerely,   Star Age, MD, PhD

## 2017-07-29 DIAGNOSIS — F4312 Post-traumatic stress disorder, chronic: Secondary | ICD-10-CM | POA: Diagnosis not present

## 2017-07-29 DIAGNOSIS — F331 Major depressive disorder, recurrent, moderate: Secondary | ICD-10-CM | POA: Diagnosis not present

## 2017-07-29 MED FILL — PANTOPRAZOLE SOD DR 40 MG T: 40 | 30 days supply | Qty: 30 | Fill #1

## 2017-07-30 MED FILL — clonazePAM 0.5 MG TABS: 0.5 | 30 days supply | Qty: 120 | Fill #0

## 2017-08-02 ENCOUNTER — Ambulatory Visit (INDEPENDENT_AMBULATORY_CARE_PROVIDER_SITE_OTHER): Payer: 59 | Admitting: Family Medicine

## 2017-08-02 VITALS — BP 117/75 | HR 89 | Temp 98.1°F | Ht 65.0 in | Wt 258.0 lb

## 2017-08-02 DIAGNOSIS — Z6841 Body Mass Index (BMI) 40.0 and over, adult: Secondary | ICD-10-CM | POA: Diagnosis not present

## 2017-08-02 DIAGNOSIS — F319 Bipolar disorder, unspecified: Secondary | ICD-10-CM

## 2017-08-02 DIAGNOSIS — D508 Other iron deficiency anemias: Secondary | ICD-10-CM

## 2017-08-02 MED ORDER — INSULIN PEN NEEDLE 31G X 6 MM MISC: 1.0000 | Freq: Every morning | 0 refills | Status: DC

## 2017-08-02 NOTE — Progress Notes (Addendum)
Office: [phone]  /  Fax: [phone]   HPI:   Chief Complaint: OBESITY Caroline Gonzales is here to discuss her progress with her obesity treatment plan. She is on the portion control better and make smarter food choices, such as increase vegetables and decrease simple carbohydrates  and is following her eating plan approximately 30 to 40 % of the time. She states she is exercising 8,000 steps a day 5 times per week. Caroline Gonzales has been off track the last few weeks. She notes increased emotional eating and states she "enjoys" eating sugar more with change in Seroquel dose. Caroline Gonzales is on Saxenda but this will end soon as her prior authorization was declined by her insurance. Her weight is 258 lb (117 kg) today and has had a weight gain of 3 pounds over a period of 3 weeks since her last visit. She has lost 20 lbs since starting treatment with [name].  Bipolar I Depression Quanta notes increased emotional eating but she feels her mood is good. Her goal is to discontinue Seroquel . She is followed by psych and has an appointment in 2 days. Caroline Gonzales struggles with emotional eating and using food for comfort to the extent that it is negatively impacting her health. She often snacks when she is not hungry. Caroline Gonzales sometimes feels she is out of control and then feels guilty that she made poor food choices. She has been working on behavior modification techniques to help reduce her emotional eating and has been somewhat successful. She shows no sign of suicidal or homicidal ideations.  Iron Deficiency Anemia Caroline Gonzales has a diagnosis of anemia. She is on a multi vitamin with iron but she was not allowed to donate blood due to low hemoglobin. Caroline Gonzales is on IUD and has no menses. Fatigue has improved with change in Seroquel   Depression screen [facility] 2/9 04/14/2017 11/12/2016 08/31/2016 06/27/2016 06/19/2016  Decreased Interest 0 0 1 0 0  Down, Depressed, Hopeless 0 0 3 0 0  PHQ - 2 Score 0 0 4 0 0  Altered sleeping - - 1  - -  Tired, decreased energy - - 3 - -  Change in appetite - - 3 - -  Feeling bad or failure about yourself  - - 3 - -  Trouble concentrating - - 0 - -  Moving slowly or fidgety/restless - - 2 - -  Suicidal thoughts - - 1 - -  PHQ-9 Score - - 17 - -      ALLERGIES: Allergies  Allergen Reactions  . Tamiflu [Oseltamivir Phosphate] Anaphylaxis  . Cephalosporins Rash  . Latex Rash  . Penicillins Rash    MEDICATIONS: Current Outpatient Medications on File Prior to Visit  Medication Sig Dispense Refill  . Cholecalciferol (VITAMIN D3) 5000 units CAPS Take 5,000 Units by mouth at bedtime.    . clonazePAM (KLONOPIN) 1 MG tablet Take 1 mg by mouth 2 (two) times daily as needed for anxiety. Taking 0.5mg  1-2 tabs BID prn    . ibuprofen (ADVIL,MOTRIN) 200 MG tablet Take 400-600 mg by mouth daily as needed for headache or moderate pain.    . Insulin Pen Needle 31G X 6 MM MISC 1 Package by Does not apply route every morning. 100 each 0  . L-methylfolate Calcium 15 MG TABS Take 15 m by mouth daily.    [name] lamoTRIgine (LAMICTAL) 200 MG tablet Take 200 mg by mouth at bedtime.    [name] levonorgestrel (MIRENA) 20 MCG/24HR IUD 1 each by Intrauterine route  once.    . Liraglutide -Weight Management (SAXENDA) 18 MG/3ML SOPN Inject 3 mg into the skin daily. 5 pen 0  . loratadine (CLARITIN) 10 MG tablet Take 10 mg by mouth at bedtime.     . Multiple Vitamin (MULTIVITAMIN) tablet Take 1 tablet by mouth Nightly.    . propranolol (INDERAL) 40 MG tablet Take 40 mg by mouth 2 (two) times daily.     . QUEtiapine (SEROQUEL) 400 MG tablet Take 200 mg at bedtime by mouth.     . sulfamethoxazole-trimethoprim (BACTRIM DS) 800-160 MG tablet Take 1 tablet 2 (two) times daily by mouth. One PO BID x 7 days 14 tablet 0   No current facility-administered medications on file prior to visit.     PAST MEDICAL HISTORY: Past Medical History:  Diagnosis Date  . Allergy    Zyrtec, Allegra.  . Anxiety    followed by Dr.  Toy Cookey  . Back pain   . Bipolar 1 disorder ([place])   . Complication of anesthesia   . Depression   . Fibroid   . Gallbladder problem   . Gastroparesis 09/14/2012   gastric emptying study in 2014  . GERD (gastroesophageal reflux disease)   . Pneumonia    2013ish  . PONV (postoperative nausea and vomiting)   . Pre-diabetes   . PTSD (post-traumatic stress disorder)   . Vitamin D deficiency     PAST SURGICAL HISTORY: Past Surgical History:  Procedure Laterality Date  . CESAREAN SECTION    . CHOLECYSTECTOMY    . DILATION AND CURETTAGE OF UTERUS    . PILONIDAL CYST EXCISION  1990's  . REMOVAL FOREIGN BODY EXTREMITY LEFT FOOT Left 11/18/2016   Performed by Trula Slade, DPM at [place] Left    Left Ankle  . WISDOM TOOTH EXTRACTION  19090's    SOCIAL HISTORY: Social History   Tobacco Use  . Smoking status: Former Smoker    Years: 10.00    Types: Cigarettes    Last attempt to quit: 2003    Years since quitting: 15.8  . Smokeless tobacco: Never Used  Substance Use Topics  . Alcohol use: Yes    Alcohol/week: 0.0 oz    Comment: 1-2 per month   . Drug use: No    FAMILY HISTORY: Family History  Problem Relation Age of Onset  . Cancer Mother        squamous cell carcinoma  . Hyperlipidemia Mother   . Depression Mother   . Anxiety disorder Mother   . Obesity Mother   . Heart disease Father        cardiomegaly, CHF  . Hyperlipidemia Father   . Hypertension Father   . Mental retardation Father   . High blood pressure Father   . Depression Father   . Anxiety disorder Father   . Obesity Father   . Cancer Maternal Grandmother   . Diabetes Maternal Grandmother   . Heart disease Maternal Grandmother   . Hyperlipidemia Maternal Grandmother   . Hypertension Maternal Grandmother   . Stroke Maternal Grandmother   . Heart disease Paternal Grandmother   . Hypertension Paternal Grandmother   . Heart disease Paternal Grandfather   . Hyperlipidemia Paternal  Grandfather   . Mental illness Paternal Grandfather   . Breast cancer Maternal Aunt   . Thyroid cancer Paternal Aunt     ROS: Review of Systems  Constitutional: Negative for weight loss.  Psychiatric/Behavioral: Positive for depression. Negative for suicidal  ideas.    PHYSICAL EXAM: Blood pressure 117/75, pulse 89, temperature 98.1 F (36.7 C), temperature source Oral, height 5\' 5"  (1.651 m), weight 258 lb (117 kg), SpO2 98 %. Body mass index is 42.93 kg/m. Physical Exam  Constitutional: She is oriented to person, place, and time. She appears well-developed and well-nourished.  Cardiovascular: Normal rate.  Pulmonary/Chest: Effort normal.  Musculoskeletal: Normal range of motion.  Neurological: She is oriented to person, place, and time.  Skin: Skin is warm and dry.  Vitals reviewed.   RECENT LABS AND TESTS: BMET    Component Value Date/Time   NA 141 06/22/2017 0904   K 4.3 06/22/2017 0904   CL 103 06/22/2017 0904   CO2 23 06/22/2017 0904   GLUCOSE 82 06/22/2017 0904   GLUCOSE 87 06/27/2016 0933   BUN 14 06/22/2017 0904   CREATININE 0.92 06/22/2017 0904   CREATININE 0.95 06/27/2016 0933   CALCIUM 9.3 06/22/2017 0904   GFRNONAA 78 06/22/2017 0904   GFRAA 89 06/22/2017 0904   Lab Results  Component Value Date   HGBA1C 5.1 06/22/2017   HGBA1C 5.3 01/11/2017   HGBA1C 5.7 (H) 08/31/2016   HGBA1C 5.6 12/01/2015   Lab Results  Component Value Date   INSULIN 25.2 (H) 06/22/2017   INSULIN 17.0 01/11/2017   INSULIN 17.4 08/31/2016   CBC    Component Value Date/Time   WBC 5.2 06/22/2017 0904   WBC 8.1 06/27/2016 0941   WBC 9.9 04/30/2016 0947   RBC 4.07 06/22/2017 0904   RBC 3.96 (A) 06/27/2016 0941   RBC 4.15 04/30/2016 0947   HGB 11.2 06/22/2017 0904   HCT 34.8 06/22/2017 0904   PLT 332 11/12/2016 1651   MCV 86 06/22/2017 0904   MCH 27.5 06/22/2017 0904   MCH 29.0 06/27/2016 0941   MCH 28.2 04/30/2016 0947   MCHC 32.2 06/22/2017 0904   MCHC 34.5  06/27/2016 0941   MCHC 33.2 04/30/2016 0947   RDW 15.7 (H) 06/22/2017 0904   LYMPHSABS 1.5 06/22/2017 0904   MONOABS 891 04/30/2016 0947   EOSABS 0.2 06/22/2017 0904   BASOSABS 0.0 06/22/2017 0904   Iron/TIBC/Ferritin/ %Sat    Component Value Date/Time   IRON 53 08/31/2016 1644   TIBC 280 08/31/2016 1644   FERRITIN 36 08/31/2016 1644   IRONPCTSAT 19 08/31/2016 1644   Lipid Panel     Component Value Date/Time   CHOL 140 06/22/2017 0904   TRIG 177 (H) 06/22/2017 0904   HDL 35 (L) 06/22/2017 0904   CHOLHDL 4.1 12/01/2015 0914   VLDL 37 (H) 12/01/2015 0914   LDLCALC 70 06/22/2017 0904   Hepatic Function Panel     Component Value Date/Time   PROT 7.0 06/22/2017 0904   ALBUMIN 3.9 06/22/2017 0904   AST 15 06/22/2017 0904   ALT 11 06/22/2017 0904   ALKPHOS 90 06/22/2017 0904   BILITOT <0.2 06/22/2017 0904      Component Value Date/Time   TSH 1.620 08/31/2016 1644   TSH 1.83 12/01/2015 0914    ASSESSMENT AND PLAN: Bipolar 1 disorder (HCC)  Other iron deficiency anemia  Class 3 severe obesity with serious comorbidity and body mass index (BMI) of 40.0 to 44.9 in adult, unspecified obesity type ([place]) - Plan: Insulin Pen Needle 31G X 6 MM MISC  PLAN:  Bipolar I Depression We will continue to monitor mood and cognitive behavioral therapy to help decrease emotional eating She has agreed to follow up as directed.  Iron Deficiency Anemia The diagnosis  of Iron deficiency anemia was discussed with Syleena and was explained in detail. She will continue multi vitamin and was advised to use iron skillet to increase dietary iron intake. prescribed.   We spent > than 50% of the 30 minute visit on the counseling as documented in the note.  Obesity Noralyn is currently in the action stage of change. As such, her goal is to continue with weight loss efforts She has agreed to portion control better and make smarter food choices, such as increase vegetables and decrease simple  carbohydrates  Maraki has been instructed to work up to a goal of 150 minutes of combined cardio and strengthening exercise per week for weight loss and overall health benefits. We discussed the following Behavioral Modification Strategies today: work on meal planning and easy cooking plans, dealing with family or coworker sabotage and holiday eating strategies   We discussed various medication options to help Janiel with her weight loss efforts and we both agreed to continue Saxenda until medication runs out, then look at other options with goal to maintain weight over the holidays. We will refill nano needles #100 with no refills.  Launa has agreed to follow up with our clinic in 3 weeks. She was informed of the importance of frequent follow up visits to maximize her success with intensive lifestyle modifications for her multiple health conditions.  I, Doreene Nest, am acting as transcriptionist for Dennard Nip, MD  I have reviewed the above documentation for accuracy and completeness, and I agree with the above. -Dennard Nip, MD   OBESITY BEHAVIORAL INTERVENTION VISIT  Today's visit was # 21 out of 22.  Starting weight: 278 lbs Starting date: 08/31/16 Today's weight : 258 lbs Today's date: 08/02/2017 Total lbs lost to date: 20 (Patients must lose 7 lbs in the first 6 months to continue with counseling)   ASK: We discussed the diagnosis of obesity with Caroline Gonzales today and Caroline Gonzales agreed to give [name] permission to discuss obesity behavioral modification therapy today.  ASSESS: Caroline Gonzales has the diagnosis of obesity and her BMI today is 42.93 Caroline Gonzales is in the action stage of change   ADVISE: Caroline Gonzales was educated on the multiple health risks of obesity as well as the benefit of weight loss to improve her health. She was advised of the need for long term treatment and the importance of lifestyle modifications.  AGREE: Multiple dietary modification options and treatment  options were discussed and  Caroline Gonzales agreed to portion control better and make smarter food choices, such as increase vegetables and decrease simple carbohydrates  We discussed the following Behavioral Modification Strategies today: work on meal planning and easy cooking plans, dealing with family or coworker sabotage and holiday eating strategies

## 2017-08-03 MED FILL — UNIFINE PENTIPS 31GX3/16: 31G X 5 MM | 90 days supply | Qty: 100 | Fill #0

## 2017-08-03 MED FILL — UNIFINE PENTIPS 31GX3/16": 31G X 5 MM | 90 days supply | Qty: 100 | Fill #0

## 2017-08-04 DIAGNOSIS — F4310 Post-traumatic stress disorder, unspecified: Secondary | ICD-10-CM | POA: Diagnosis not present

## 2017-08-12 DIAGNOSIS — F331 Major depressive disorder, recurrent, moderate: Secondary | ICD-10-CM | POA: Diagnosis not present

## 2017-08-12 DIAGNOSIS — F4312 Post-traumatic stress disorder, chronic: Secondary | ICD-10-CM | POA: Diagnosis not present

## 2017-08-23 ENCOUNTER — Ambulatory Visit (INDEPENDENT_AMBULATORY_CARE_PROVIDER_SITE_OTHER): Payer: 59 | Admitting: Family Medicine

## 2017-08-25 ENCOUNTER — Ambulatory Visit (INDEPENDENT_AMBULATORY_CARE_PROVIDER_SITE_OTHER): Payer: 59 | Admitting: Family Medicine

## 2017-08-25 VITALS — BP 135/83 | HR 78 | Temp 97.8°F | Ht 65.0 in | Wt 263.0 lb

## 2017-08-25 DIAGNOSIS — Z6841 Body Mass Index (BMI) 40.0 and over, adult: Secondary | ICD-10-CM | POA: Diagnosis not present

## 2017-08-25 DIAGNOSIS — D508 Other iron deficiency anemias: Secondary | ICD-10-CM

## 2017-08-25 NOTE — Progress Notes (Signed)
Office: [phone]  /  Fax: [phone]   HPI:   Chief Complaint: OBESITY Caroline Gonzales is here to discuss her progress with her obesity treatment plan. She is on the portion control better and make smarter food choices, such as increase vegetables and decrease simple carbohydrates  and is following her eating plan approximately 0 % of the time. She states she is walking steps for 30 minutes 5 times per week. Caroline Gonzales is off track with feeling sick and increased stress eating over finals. The bad weather kept her inside and she had increased temptations.  Her weight is 263 lb (119.3 kg) today and has gained 5 pounds since her last visit. She has lost 15 lbs since starting treatment with [name].  Iron Deficiency Anemia Caroline Gonzales has a diagnosis of iron deficiency anemia. She is on multivitamins with iron, but not recently and with increase GI upset. Her last hemoglobin improved.   ALLERGIES: Allergies  Allergen Reactions  . Tamiflu [Oseltamivir Phosphate] Anaphylaxis  . Cephalosporins Rash  . Latex Rash  . Penicillins Rash    MEDICATIONS: Current Outpatient Medications on File Prior to Visit  Medication Sig Dispense Refill  . Cholecalciferol (VITAMIN D3) 5000 units CAPS Take 5,000 Units by mouth at bedtime.    . clonazePAM (KLONOPIN) 1 MG tablet Take 1 mg by mouth 2 (two) times daily as needed for anxiety. Taking 0.5mg  1-2 tabs BID prn    . ibuprofen (ADVIL,MOTRIN) 200 MG tablet Take 400-600 mg by mouth daily as needed for headache or moderate pain.    . Insulin Pen Needle 31G X 6 MM MISC 1 Package every morning by Does not apply route. 100 each 0  . L-methylfolate Calcium 15 MG TABS Take 15 m by mouth daily.    [name] lamoTRIgine (LAMICTAL) 200 MG tablet Take 200 mg by mouth at bedtime.    [name] levonorgestrel (MIRENA) 20 MCG/24HR IUD 1 each by Intrauterine route once.    . Liraglutide -Weight Management (SAXENDA) 18 MG/3ML SOPN Inject 3 mg into the skin daily. 5 pen 0  . loratadine (CLARITIN) 10  MG tablet Take 10 mg by mouth at bedtime.     . Multiple Vitamin (MULTIVITAMIN) tablet Take 1 tablet by mouth Nightly.    . propranolol (INDERAL) 40 MG tablet Take 40 mg by mouth 2 (two) times daily.     . QUEtiapine (SEROQUEL) 400 MG tablet Take 200 mg at bedtime by mouth.     . sulfamethoxazole-trimethoprim (BACTRIM DS) 800-160 MG tablet Take 1 tablet 2 (two) times daily by mouth. One PO BID x 7 days 14 tablet 0   No current facility-administered medications on file prior to visit.     PAST MEDICAL HISTORY: Past Medical History:  Diagnosis Date  . Allergy    Zyrtec, Allegra.  . Anxiety    followed by Dr. Toy Cookey  . Back pain   . Bipolar 1 disorder ([place])   . Complication of anesthesia   . Depression   . Fibroid   . Gallbladder problem   . Gastroparesis 09/14/2012   gastric emptying study in 2014  . GERD (gastroesophageal reflux disease)   . Pneumonia    2013ish  . PONV (postoperative nausea and vomiting)   . Pre-diabetes   . PTSD (post-traumatic stress disorder)   . Vitamin D deficiency     PAST SURGICAL HISTORY: Past Surgical History:  Procedure Laterality Date  . CESAREAN SECTION    . CHOLECYSTECTOMY    . DILATION AND CURETTAGE OF UTERUS    .  FOREIGN BODY REMOVAL Left 11/18/2016   Procedure: REMOVAL FOREIGN BODY EXTREMITY LEFT FOOT;  Surgeon: Trula Slade, DPM;  Location: [facility];  Service: Podiatry;  Laterality: Left;  . PILONIDAL CYST EXCISION  1990's  . TENDON REPAIR Left    Left Ankle  . WISDOM TOOTH EXTRACTION  19090's    SOCIAL HISTORY: Social History   Tobacco Use  . Smoking status: Former Smoker    Years: 10.00    Types: Cigarettes    Last attempt to quit: 2003    Years since quitting: 15.9  . Smokeless tobacco: Never Used  Substance Use Topics  . Alcohol use: Yes    Alcohol/week: 0.0 oz    Comment: 1-2 per month   . Drug use: No    FAMILY HISTORY: Family History  Problem Relation Age of Onset  . Cancer Mother        squamous cell carcinoma   . Hyperlipidemia Mother   . Depression Mother   . Anxiety disorder Mother   . Obesity Mother   . Heart disease Father        cardiomegaly, CHF  . Hyperlipidemia Father   . Hypertension Father   . Mental retardation Father   . High blood pressure Father   . Depression Father   . Anxiety disorder Father   . Obesity Father   . Cancer Maternal Grandmother   . Diabetes Maternal Grandmother   . Heart disease Maternal Grandmother   . Hyperlipidemia Maternal Grandmother   . Hypertension Maternal Grandmother   . Stroke Maternal Grandmother   . Heart disease Paternal Grandmother   . Hypertension Paternal Grandmother   . Heart disease Paternal Grandfather   . Hyperlipidemia Paternal Grandfather   . Mental illness Paternal Grandfather   . Breast cancer Maternal Aunt   . Thyroid cancer Paternal Aunt     ROS: Review of Systems  Constitutional: Negative for weight loss.    PHYSICAL EXAM: Blood pressure 135/83, pulse 78, temperature 97.8 F (36.6 C), temperature source Oral, height 5\' 5"  (1.651 m), weight 263 lb (119.3 kg), SpO2 100 %. Body mass index is 43.77 kg/m. Physical Exam  Constitutional: She is oriented to person, place, and time. She appears well-developed and well-nourished.  Cardiovascular: Normal rate.  Pulmonary/Chest: Effort normal.  Musculoskeletal: Normal range of motion.  Neurological: She is oriented to person, place, and time.  Skin: Skin is warm and dry.  Psychiatric: She has a normal mood and affect. Her behavior is normal.  Vitals reviewed.   RECENT LABS AND TESTS: BMET    Component Value Date/Time   NA 141 06/22/2017 0904   K 4.3 06/22/2017 0904   CL 103 06/22/2017 0904   CO2 23 06/22/2017 0904   GLUCOSE 82 06/22/2017 0904   GLUCOSE 87 06/27/2016 0933   BUN 14 06/22/2017 0904   CREATININE 0.92 06/22/2017 0904   CREATININE 0.95 06/27/2016 0933   CALCIUM 9.3 06/22/2017 0904   GFRNONAA 78 06/22/2017 0904   GFRAA 89 06/22/2017 0904   Lab Results   Component Value Date   HGBA1C 5.1 06/22/2017   HGBA1C 5.3 01/11/2017   HGBA1C 5.7 (H) 08/31/2016   HGBA1C 5.6 12/01/2015   Lab Results  Component Value Date   INSULIN 25.2 (H) 06/22/2017   INSULIN 17.0 01/11/2017   INSULIN 17.4 08/31/2016   CBC    Component Value Date/Time   WBC 5.2 06/22/2017 0904   WBC 8.1 06/27/2016 0941   WBC 9.9 04/30/2016 0947   RBC 4.07  06/22/2017 0904   RBC 3.96 (A) 06/27/2016 0941   RBC 4.15 04/30/2016 0947   HGB 11.2 06/22/2017 0904   HCT 34.8 06/22/2017 0904   PLT 332 11/12/2016 1651   MCV 86 06/22/2017 0904   MCH 27.5 06/22/2017 0904   MCH 29.0 06/27/2016 0941   MCH 28.2 04/30/2016 0947   MCHC 32.2 06/22/2017 0904   MCHC 34.5 06/27/2016 0941   MCHC 33.2 04/30/2016 0947   RDW 15.7 (H) 06/22/2017 0904   LYMPHSABS 1.5 06/22/2017 0904   MONOABS 891 04/30/2016 0947   EOSABS 0.2 06/22/2017 0904   BASOSABS 0.0 06/22/2017 0904   Iron/TIBC/Ferritin/ %Sat    Component Value Date/Time   IRON 53 08/31/2016 1644   TIBC 280 08/31/2016 1644   FERRITIN 36 08/31/2016 1644   IRONPCTSAT 19 08/31/2016 1644   Lipid Panel     Component Value Date/Time   CHOL 140 06/22/2017 0904   TRIG 177 (H) 06/22/2017 0904   HDL 35 (L) 06/22/2017 0904   CHOLHDL 4.1 12/01/2015 0914   VLDL 37 (H) 12/01/2015 0914   LDLCALC 70 06/22/2017 0904   Hepatic Function Panel     Component Value Date/Time   PROT 7.0 06/22/2017 0904   ALBUMIN 3.9 06/22/2017 0904   AST 15 06/22/2017 0904   ALT 11 06/22/2017 0904   ALKPHOS 90 06/22/2017 0904   BILITOT <0.2 06/22/2017 0904      Component Value Date/Time   TSH 1.620 08/31/2016 1644   TSH 1.83 12/01/2015 0914    ASSESSMENT AND PLAN: Other iron deficiency anemia  Class 3 severe obesity with serious comorbidity and body mass index (BMI) of 40.0 to 44.9 in adult, unspecified obesity type (HCC)  PLAN:  Iron Deficiency anemia The diagnosis of Iron deficiency anemia was discussed with Caroline Gonzales and was explained in  detail. She was given suggestions of iron rich foods. We will recheck labs in 2 months and Caroline Gonzales will continue iron supplements when GI upset resolves. Caroline Gonzales agrees to follow up with our clinic in 1 week.  We spent > than 50% of the 15 minute visit on the counseling as documented in the note.  Obesity Caroline Gonzales is currently in the action stage of change. As such, her goal is to continue with weight loss efforts She has agreed to portion control better and make smarter food choices, such as increase vegetables, decrease simple carbohydrates, and increase protein Caroline Gonzales has been instructed to work up to a goal of 150 minutes of combined cardio and strengthening exercise per week for weight loss and overall health benefits. We discussed the following Behavioral Modification Strategies today: increasing lean protein intake, dealing with family or coworker sabotage and holiday eating strategies    Caroline Gonzales has agreed to follow up with our clinic in 1 weeks. She was informed of the importance of frequent follow up visits to maximize her success with intensive lifestyle modifications for her multiple health conditions.  I, Trixie Dredge, am acting as transcriptionist for Dennard Nip, MD  I have reviewed the above documentation for accuracy and completeness, and I agree with the above. -Dennard Nip, MD     Today's visit was # 22 out of 22.  Starting weight: 278 lbs Starting date: 08/31/16 Today's weight : 263 lbs  Today's date: 08/25/2017 Total lbs lost to date: 15 (Patients must lose 7 lbs in the first 6 months to continue with counseling)   ASK: We discussed the diagnosis of obesity with Caroline Gonzales today and Caroline Gonzales agreed to  give [name] permission to discuss obesity behavioral modification therapy today.  ASSESS: Caroline Gonzales has the diagnosis of obesity and her BMI today is 43.77 Caroline Gonzales is in the action stage of change   ADVISE: Caroline Gonzales was educated on the multiple health  risks of obesity as well as the benefit of weight loss to improve her health. She was advised of the need for long term treatment and the importance of lifestyle modifications.  AGREE: Multiple dietary modification options and treatment options were discussed and  Caroline Gonzales agreed to portion control better and make smarter food choices, such as increase vegetables, decrease simple carbohydrates, and increase protein  We discussed the following Behavioral Modification Strategies today: increasing lean protein intake, dealing with family or coworker sabotage and holiday eating strategies

## 2017-09-01 ENCOUNTER — Ambulatory Visit (INDEPENDENT_AMBULATORY_CARE_PROVIDER_SITE_OTHER): Payer: 59 | Admitting: Family Medicine

## 2017-09-01 ENCOUNTER — Encounter (INDEPENDENT_AMBULATORY_CARE_PROVIDER_SITE_OTHER): Payer: Self-pay

## 2017-09-03 MED FILL — PANTOPRAZOLE SOD DR 40 MG T: 40 | 30 days supply | Qty: 30 | Fill #2

## 2017-09-03 MED FILL — clonazePAM 0.5 MG TABS: 0.5 | 30 days supply | Qty: 120 | Fill #0

## 2017-09-22 ENCOUNTER — Ambulatory Visit (INDEPENDENT_AMBULATORY_CARE_PROVIDER_SITE_OTHER): Payer: 59 | Admitting: Family Medicine

## 2017-09-22 VITALS — BP 112/74 | HR 89 | Temp 98.2°F | Ht 65.0 in | Wt 258.0 lb

## 2017-09-22 DIAGNOSIS — M5136 Other intervertebral disc degeneration, lumbar region: Secondary | ICD-10-CM | POA: Diagnosis not present

## 2017-09-22 DIAGNOSIS — E559 Vitamin D deficiency, unspecified: Secondary | ICD-10-CM

## 2017-09-22 DIAGNOSIS — Z6841 Body Mass Index (BMI) 40.0 and over, adult: Secondary | ICD-10-CM

## 2017-09-22 DIAGNOSIS — F331 Major depressive disorder, recurrent, moderate: Secondary | ICD-10-CM | POA: Diagnosis not present

## 2017-09-22 DIAGNOSIS — F4312 Post-traumatic stress disorder, chronic: Secondary | ICD-10-CM | POA: Diagnosis not present

## 2017-09-22 MED FILL — MELOXICAM 7.5 MG TABLET: 7.5 | 30 days supply | Qty: 60 | Fill #0

## 2017-09-22 NOTE — Progress Notes (Signed)
Office: [phone]  /  Fax: [phone]   HPI:   Chief Complaint: OBESITY Caroline Gonzales is here to discuss her progress with her obesity treatment plan. She is on the portion control better and make smarter food choices plan and is following her eating plan approximately 85 % of the time. She states she is exercising 0 minutes 0 times per week. Caroline Gonzales mostly controlled her portions and made smarter food choices over the holidays. She is working on increasing lean protein and decreasing her sodium intake, but she is still drinking liquids with sugars. Her weight is 258 lb (117 kg) today and has had a weight loss of 5 pounds over a period of 3 weeks since her last visit. She has lost 20 lbs since starting treatment with [name].  Vitamin D deficiency Caroline Gonzales has a diagnosis of vitamin D deficiency. She is currently taking OTC vit D and level is at goal. Caroline Gonzales denies nausea, vomiting or muscle weakness.   Ref. Range 06/22/2017 09:04  Vitamin D, 25-Hydroxy Latest Ref Range: 30.0 - 100.0 ng/mL 54.2    ALLERGIES: Allergies  Allergen Reactions  . Tamiflu [Oseltamivir Phosphate] Anaphylaxis  . Cephalosporins Rash  . Latex Rash  . Penicillins Rash    MEDICATIONS: Current Outpatient Medications on File Prior to Visit  Medication Sig Dispense Refill  . Cholecalciferol (VITAMIN D3) 5000 units CAPS Take 5,000 Units by mouth at bedtime.    . clonazePAM (KLONOPIN) 1 MG tablet Take 1 mg by mouth 2 (two) times daily as needed for anxiety. Taking 0.5mg  1-2 tabs BID prn    . ibuprofen (ADVIL,MOTRIN) 200 MG tablet Take 400-600 mg by mouth daily as needed for headache or moderate pain.    . Insulin Pen Needle 31G X 6 MM MISC 1 Package every morning by Does not apply route. 100 each 0  . L-methylfolate Calcium 15 MG TABS Take 15 m by mouth daily.    [name] lamoTRIgine (LAMICTAL) 200 MG tablet Take 200 mg by mouth at bedtime.    [name] levonorgestrel (MIRENA) 20 MCG/24HR IUD 1 each by Intrauterine route once.    .  Liraglutide -Weight Management (SAXENDA) 18 MG/3ML SOPN Inject 3 mg into the skin daily. 5 pen 0  . loratadine (CLARITIN) 10 MG tablet Take 10 mg by mouth at bedtime.     . Multiple Vitamin (MULTIVITAMIN) tablet Take 1 tablet by mouth Nightly.    . propranolol (INDERAL) 40 MG tablet Take 40 mg by mouth 2 (two) times daily.     . QUEtiapine (SEROQUEL) 400 MG tablet Take 200 mg at bedtime by mouth.      No current facility-administered medications on file prior to visit.     PAST MEDICAL HISTORY: Past Medical History:  Diagnosis Date  . Allergy    Zyrtec, Allegra.  . Anxiety    followed by Dr. Toy Cookey  . Back pain   . Bipolar 1 disorder ([place])   . Complication of anesthesia   . Depression   . Fibroid   . Gallbladder problem   . Gastroparesis 09/14/2012   gastric emptying study in 2014  . GERD (gastroesophageal reflux disease)   . Pneumonia    2013ish  . PONV (postoperative nausea and vomiting)   . Pre-diabetes   . PTSD (post-traumatic stress disorder)   . Vitamin D deficiency     PAST SURGICAL HISTORY: Past Surgical History:  Procedure Laterality Date  . CESAREAN SECTION    . CHOLECYSTECTOMY    . DILATION AND CURETTAGE  OF UTERUS    . FOREIGN BODY REMOVAL Left 11/18/2016   Procedure: REMOVAL FOREIGN BODY EXTREMITY LEFT FOOT;  Surgeon: Trula Slade, DPM;  Location: [facility];  Service: Podiatry;  Laterality: Left;  . PILONIDAL CYST EXCISION  1990's  . TENDON REPAIR Left    Left Ankle  . WISDOM TOOTH EXTRACTION  19090's    SOCIAL HISTORY: Social History   Tobacco Use  . Smoking status: Former Smoker    Years: 10.00    Types: Cigarettes    Last attempt to quit: 2003    Years since quitting: 16.0  . Smokeless tobacco: Never Used  Substance Use Topics  . Alcohol use: Yes    Alcohol/week: 0.0 oz    Comment: 1-2 per month   . Drug use: No    FAMILY HISTORY: Family History  Problem Relation Age of Onset  . Cancer Mother        squamous cell carcinoma  .  Hyperlipidemia Mother   . Depression Mother   . Anxiety disorder Mother   . Obesity Mother   . Heart disease Father        cardiomegaly, CHF  . Hyperlipidemia Father   . Hypertension Father   . Mental retardation Father   . High blood pressure Father   . Depression Father   . Anxiety disorder Father   . Obesity Father   . Cancer Maternal Grandmother   . Diabetes Maternal Grandmother   . Heart disease Maternal Grandmother   . Hyperlipidemia Maternal Grandmother   . Hypertension Maternal Grandmother   . Stroke Maternal Grandmother   . Heart disease Paternal Grandmother   . Hypertension Paternal Grandmother   . Heart disease Paternal Grandfather   . Hyperlipidemia Paternal Grandfather   . Mental illness Paternal Grandfather   . Breast cancer Maternal Aunt   . Thyroid cancer Paternal Aunt     ROS: Review of Systems  Constitutional: Positive for weight loss.  Gastrointestinal: Negative for nausea and vomiting.  Musculoskeletal:       Negative muscle weakness    PHYSICAL EXAM: Blood pressure 112/74, pulse 89, temperature 98.2 F (36.8 C), temperature source Oral, height 5\' 5"  (1.651 m), weight 258 lb (117 kg), SpO2 98 %. Body mass index is 42.93 kg/m. Physical Exam  Constitutional: She is oriented to person, place, and time. She appears well-developed and well-nourished.  Cardiovascular: Normal rate.  Pulmonary/Chest: Effort normal.  Musculoskeletal: Normal range of motion.  Neurological: She is oriented to person, place, and time.  Skin: Skin is warm and dry.  Psychiatric: She has a normal mood and affect. Her behavior is normal.  Vitals reviewed.   RECENT LABS AND TESTS: BMET    Component Value Date/Time   NA 141 06/22/2017 0904   K 4.3 06/22/2017 0904   CL 103 06/22/2017 0904   CO2 23 06/22/2017 0904   GLUCOSE 82 06/22/2017 0904   GLUCOSE 87 06/27/2016 0933   BUN 14 06/22/2017 0904   CREATININE 0.92 06/22/2017 0904   CREATININE 0.95 06/27/2016 0933    CALCIUM 9.3 06/22/2017 0904   GFRNONAA 78 06/22/2017 0904   GFRAA 89 06/22/2017 0904   Lab Results  Component Value Date   HGBA1C 5.1 06/22/2017   HGBA1C 5.3 01/11/2017   HGBA1C 5.7 (H) 08/31/2016   HGBA1C 5.6 12/01/2015   Lab Results  Component Value Date   INSULIN 25.2 (H) 06/22/2017   INSULIN 17.0 01/11/2017   INSULIN 17.4 08/31/2016   CBC    Component  Value Date/Time   WBC 5.2 06/22/2017 0904   WBC 8.1 06/27/2016 0941   WBC 9.9 04/30/2016 0947   RBC 4.07 06/22/2017 0904   RBC 3.96 (A) 06/27/2016 0941   RBC 4.15 04/30/2016 0947   HGB 11.2 06/22/2017 0904   HCT 34.8 06/22/2017 0904   PLT 332 11/12/2016 1651   MCV 86 06/22/2017 0904   MCH 27.5 06/22/2017 0904   MCH 29.0 06/27/2016 0941   MCH 28.2 04/30/2016 0947   MCHC 32.2 06/22/2017 0904   MCHC 34.5 06/27/2016 0941   MCHC 33.2 04/30/2016 0947   RDW 15.7 (H) 06/22/2017 0904   LYMPHSABS 1.5 06/22/2017 0904   MONOABS 891 04/30/2016 0947   EOSABS 0.2 06/22/2017 0904   BASOSABS 0.0 06/22/2017 0904   Iron/TIBC/Ferritin/ %Sat    Component Value Date/Time   IRON 53 08/31/2016 1644   TIBC 280 08/31/2016 1644   FERRITIN 36 08/31/2016 1644   IRONPCTSAT 19 08/31/2016 1644   Lipid Panel     Component Value Date/Time   CHOL 140 06/22/2017 0904   TRIG 177 (H) 06/22/2017 0904   HDL 35 (L) 06/22/2017 0904   CHOLHDL 4.1 12/01/2015 0914   VLDL 37 (H) 12/01/2015 0914   LDLCALC 70 06/22/2017 0904   Hepatic Function Panel     Component Value Date/Time   PROT 7.0 06/22/2017 0904   ALBUMIN 3.9 06/22/2017 0904   AST 15 06/22/2017 0904   ALT 11 06/22/2017 0904   ALKPHOS 90 06/22/2017 0904   BILITOT <0.2 06/22/2017 0904      Component Value Date/Time   TSH 1.620 08/31/2016 1644   TSH 1.83 12/01/2015 0914     Ref. Range 06/22/2017 09:04  Vitamin D, 25-Hydroxy Latest Ref Range: 30.0 - 100.0 ng/mL 54.2    ASSESSMENT AND PLAN: Vitamin D deficiency  Class 3 severe obesity with serious comorbidity and body mass  index (BMI) of 40.0 to 44.9 in adult, unspecified obesity type ([place])  PLAN:  Vitamin D Deficiency Caroline Gonzales was informed that low vitamin D levels contributes to fatigue and are associated with obesity, breast, and colon cancer. She agrees to continue to take OTC Vit D @5 ,000 IU daily. We will check labs in 2 weeks and will follow up for routine testing of vitamin D, at least 2-3 times per year. She was informed of the risk of over-replacement of vitamin D and agrees to not increase her dose unless he discusses this with [name] first. Citlaly agrees to follow up with our clinic in 2 weeks.  We spent > than 50% of the 15 minute visit on the counseling as documented in the note.  Obesity Caroline Gonzales is currently in the action stage of change. As such, her goal is to continue with weight loss efforts She has agreed to portion control better and make smarter food choices, such as increase vegetables and decrease simple carbohydrates with 100 grams of protein +200 calorie snacks daily. Caroline Gonzales has been instructed to work up to a goal of 150 minutes of combined cardio and strengthening exercise per week or 5,000 steps or more daily for weight loss and overall health benefits. We discussed the following Behavioral Modification Strategies today: increasing lean protein intake and decrease liquid calories We discussed various medication options to help Caroline Gonzales with her weight loss efforts and we both agreed to continue Saxenda 1.2 mg daily  Caroline Gonzales has agreed to follow up with our clinic in 2 weeks. She was informed of the importance of frequent follow up visits to  maximize her success with intensive lifestyle modifications for her multiple health conditions.  I, Doreene Nest, am acting as transcriptionist for Dennard Nip, MD  I have reviewed the above documentation for accuracy and completeness, and I agree with the above. -Dennard Nip, MD

## 2017-09-28 ENCOUNTER — Encounter (INDEPENDENT_AMBULATORY_CARE_PROVIDER_SITE_OTHER): Payer: Self-pay | Admitting: Family Medicine

## 2017-09-30 DIAGNOSIS — F4322 Adjustment disorder with anxiety: Secondary | ICD-10-CM | POA: Diagnosis not present

## 2017-10-05 MED FILL — lamoTRIgine 200 MG TABS: 200 | 90 days supply | Qty: 90 | Fill #1

## 2017-10-05 MED FILL — clonazePAM 0.5 MG TABS: 0.5 | 30 days supply | Qty: 120 | Fill #0

## 2017-10-05 MED FILL — PANTOPRAZOLE SOD DR 40 MG T: 40 | 30 days supply | Qty: 30 | Fill #3

## 2017-10-07 ENCOUNTER — Ambulatory Visit (INDEPENDENT_AMBULATORY_CARE_PROVIDER_SITE_OTHER): Payer: 59 | Admitting: Family Medicine

## 2017-10-07 VITALS — BP 119/80 | HR 85 | Temp 97.7°F | Ht 65.0 in | Wt 261.0 lb

## 2017-10-07 DIAGNOSIS — E7849 Other hyperlipidemia: Secondary | ICD-10-CM

## 2017-10-07 DIAGNOSIS — Z6841 Body Mass Index (BMI) 40.0 and over, adult: Secondary | ICD-10-CM

## 2017-10-07 DIAGNOSIS — E559 Vitamin D deficiency, unspecified: Secondary | ICD-10-CM | POA: Diagnosis not present

## 2017-10-07 DIAGNOSIS — Z9189 Other specified personal risk factors, not elsewhere classified: Secondary | ICD-10-CM | POA: Diagnosis not present

## 2017-10-07 DIAGNOSIS — E781 Pure hyperglyceridemia: Secondary | ICD-10-CM | POA: Insufficient documentation

## 2017-10-07 DIAGNOSIS — R7303 Prediabetes: Secondary | ICD-10-CM

## 2017-10-07 HISTORY — DX: Pure hyperglyceridemia: E78.1

## 2017-10-07 NOTE — Progress Notes (Signed)
Office: [phone]  /  Fax: [phone]   HPI:   Chief Complaint: OBESITY Caroline Gonzales is here to discuss her progress with her obesity treatment plan. She is on the portion control better and make smarter food choices, such as increase vegetables and decrease simple carbohydrates with 100 grams of protein + 200 calorie snacks daily and is following her eating plan approximately 90 % of the time. She states she is walking 5,000 steps 7 times per week. Caroline Gonzales has continued to journal and is meeting all of her protein goals but calories are still too high. She is retaining fluid today and hasn't and actually gained fat. She is on Saxenda 1.2 mg.  Her weight is 261 lb (118.4 kg) today and has gaind 3 pounds since her last visit. She has lost 17 lbs since starting treatment with [name].  Hyperlipidemia Caroline Gonzales has hyperlipidemia and has been attempting to improve her cholesterol levels with intensive lifestyle modification including a low saturated fat diet, exercise and weight loss. She is due for labs. Caroline Gonzales denies any chest pain, claudication or myalgias.  Vitamin D deficiency Caroline Gonzales has a diagnosis of vitamin D deficiency. She is currently taking Vit D, last lab at goal. She denies nausea, vomiting or muscle weakness.  Pre-Diabetes Caroline Gonzales has a diagnosis of pre-diabetes based on her elevated Hgb A1c and was informed this puts her at greater risk of developing diabetes. She is attempting to diet control, she is not on metformin but she is on Liraglutide and continues to work on diet and exercise to decrease risk of diabetes. She denies nausea or hypoglycemia.  At risk for diabetes Caroline Gonzales is at higher than average risk for developing diabetes due to her obesity and pre-diabetes. She currently denies polyuria or polydipsia.  ALLERGIES: Allergies  Allergen Reactions  . Tamiflu [Oseltamivir Phosphate] Anaphylaxis  . Cephalosporins Rash  . Latex Rash  . Penicillins Rash     MEDICATIONS: Current Outpatient Medications on File Prior to Visit  Medication Sig Dispense Refill  . Cholecalciferol (VITAMIN D3) 5000 units CAPS Take 5,000 Units by mouth at bedtime.    . clonazePAM (KLONOPIN) 1 MG tablet Take 1 mg by mouth 2 (two) times daily as needed for anxiety. Taking 0.5mg  1-2 tabs BID prn    . ibuprofen (ADVIL,MOTRIN) 200 MG tablet Take 400-600 mg by mouth daily as needed for headache or moderate pain.    . Insulin Pen Needle 31G X 6 MM MISC 1 Package every morning by Does not apply route. 100 each 0  . L-methylfolate Calcium 15 MG TABS Take 15 m by mouth daily.    [name] lamoTRIgine (LAMICTAL) 200 MG tablet Take 200 mg by mouth at bedtime.    [name] levonorgestrel (MIRENA) 20 MCG/24HR IUD 1 each by Intrauterine route once.    . Liraglutide -Weight Management (SAXENDA) 18 MG/3ML SOPN Inject 3 mg into the skin daily. 5 pen 0  . loratadine (CLARITIN) 10 MG tablet Take 10 mg by mouth at bedtime.     . Multiple Vitamin (MULTIVITAMIN) tablet Take 1 tablet by mouth Nightly.    . propranolol (INDERAL) 40 MG tablet Take 40 mg by mouth 2 (two) times daily.     . QUEtiapine (SEROQUEL) 400 MG tablet Take 200 mg at bedtime by mouth.      No current facility-administered medications on file prior to visit.     PAST MEDICAL HISTORY: Past Medical History:  Diagnosis Date  . Allergy    Zyrtec, Allegra.  . Anxiety  followed by Dr. Toy Cookey  . Back pain   . Bipolar 1 disorder ([place])   . Complication of anesthesia   . Depression   . Fibroid   . Gallbladder problem   . Gastroparesis 09/14/2012   gastric emptying study in 2014  . GERD (gastroesophageal reflux disease)   . Pneumonia    2013ish  . PONV (postoperative nausea and vomiting)   . Pre-diabetes   . PTSD (post-traumatic stress disorder)   . Vitamin D deficiency     PAST SURGICAL HISTORY: Past Surgical History:  Procedure Laterality Date  . CESAREAN SECTION    . CHOLECYSTECTOMY    . DILATION AND CURETTAGE OF UTERUS     . FOREIGN BODY REMOVAL Left 11/18/2016   Procedure: REMOVAL FOREIGN BODY EXTREMITY LEFT FOOT;  Surgeon: Trula Slade, DPM;  Location: [facility];  Service: Podiatry;  Laterality: Left;  . PILONIDAL CYST EXCISION  1990's  . TENDON REPAIR Left    Left Ankle  . WISDOM TOOTH EXTRACTION  19090's    SOCIAL HISTORY: Social History   Tobacco Use  . Smoking status: Former Smoker    Years: 10.00    Types: Cigarettes    Last attempt to quit: 2003    Years since quitting: 16.0  . Smokeless tobacco: Never Used  Substance Use Topics  . Alcohol use: Yes    Alcohol/week: 0.0 oz    Comment: 1-2 per month   . Drug use: No    FAMILY HISTORY: Family History  Problem Relation Age of Onset  . Cancer Mother        squamous cell carcinoma  . Hyperlipidemia Mother   . Depression Mother   . Anxiety disorder Mother   . Obesity Mother   . Heart disease Father        cardiomegaly, CHF  . Hyperlipidemia Father   . Hypertension Father   . Mental retardation Father   . High blood pressure Father   . Depression Father   . Anxiety disorder Father   . Obesity Father   . Cancer Maternal Grandmother   . Diabetes Maternal Grandmother   . Heart disease Maternal Grandmother   . Hyperlipidemia Maternal Grandmother   . Hypertension Maternal Grandmother   . Stroke Maternal Grandmother   . Heart disease Paternal Grandmother   . Hypertension Paternal Grandmother   . Heart disease Paternal Grandfather   . Hyperlipidemia Paternal Grandfather   . Mental illness Paternal Grandfather   . Breast cancer Maternal Aunt   . Thyroid cancer Paternal Aunt     ROS: Review of Systems  Constitutional: Negative for weight loss.  Cardiovascular: Negative for chest pain and claudication.  Gastrointestinal: Negative for nausea and vomiting.  Genitourinary: Negative for frequency.  Musculoskeletal: Negative for myalgias.       Negative muscle weakness  Endo/Heme/Allergies: Negative for polydipsia.       Negative  hypoglycemia    PHYSICAL EXAM: Blood pressure 119/80, pulse 85, temperature 97.7 F (36.5 C), temperature source Oral, height 5\' 5"  (1.651 m), weight 261 lb (118.4 kg), SpO2 99 %. Body mass index is 43.43 kg/m. Physical Exam  Constitutional: She is oriented to person, place, and time. She appears well-developed and well-nourished.  Cardiovascular: Normal rate.  Pulmonary/Chest: Effort normal.  Musculoskeletal: Normal range of motion.  Neurological: She is oriented to person, place, and time.  Skin: Skin is warm and dry.  Psychiatric: She has a normal mood and affect. Her behavior is normal.  Vitals reviewed.  RECENT LABS AND TESTS: BMET    Component Value Date/Time   NA 141 06/22/2017 0904   K 4.3 06/22/2017 0904   CL 103 06/22/2017 0904   CO2 23 06/22/2017 0904   GLUCOSE 82 06/22/2017 0904   GLUCOSE 87 06/27/2016 0933   BUN 14 06/22/2017 0904   CREATININE 0.92 06/22/2017 0904   CREATININE 0.95 06/27/2016 0933   CALCIUM 9.3 06/22/2017 0904   GFRNONAA 78 06/22/2017 0904   GFRAA 89 06/22/2017 0904   Lab Results  Component Value Date   HGBA1C 5.1 06/22/2017   HGBA1C 5.3 01/11/2017   HGBA1C 5.7 (H) 08/31/2016   HGBA1C 5.6 12/01/2015   Lab Results  Component Value Date   INSULIN 25.2 (H) 06/22/2017   INSULIN 17.0 01/11/2017   INSULIN 17.4 08/31/2016   CBC    Component Value Date/Time   WBC 5.2 06/22/2017 0904   WBC 8.1 06/27/2016 0941   WBC 9.9 04/30/2016 0947   RBC 4.07 06/22/2017 0904   RBC 3.96 (A) 06/27/2016 0941   RBC 4.15 04/30/2016 0947   HGB 11.2 06/22/2017 0904   HCT 34.8 06/22/2017 0904   PLT 332 11/12/2016 1651   MCV 86 06/22/2017 0904   MCH 27.5 06/22/2017 0904   MCH 29.0 06/27/2016 0941   MCH 28.2 04/30/2016 0947   MCHC 32.2 06/22/2017 0904   MCHC 34.5 06/27/2016 0941   MCHC 33.2 04/30/2016 0947   RDW 15.7 (H) 06/22/2017 0904   LYMPHSABS 1.5 06/22/2017 0904   MONOABS 891 04/30/2016 0947   EOSABS 0.2 06/22/2017 0904   BASOSABS 0.0  06/22/2017 0904   Iron/TIBC/Ferritin/ %Sat    Component Value Date/Time   IRON 53 08/31/2016 1644   TIBC 280 08/31/2016 1644   FERRITIN 36 08/31/2016 1644   IRONPCTSAT 19 08/31/2016 1644   Lipid Panel     Component Value Date/Time   CHOL 140 06/22/2017 0904   TRIG 177 (H) 06/22/2017 0904   HDL 35 (L) 06/22/2017 0904   CHOLHDL 4.1 12/01/2015 0914   VLDL 37 (H) 12/01/2015 0914   LDLCALC 70 06/22/2017 0904   Hepatic Function Panel     Component Value Date/Time   PROT 7.0 06/22/2017 0904   ALBUMIN 3.9 06/22/2017 0904   AST 15 06/22/2017 0904   ALT 11 06/22/2017 0904   ALKPHOS 90 06/22/2017 0904   BILITOT <0.2 06/22/2017 0904      Component Value Date/Time   TSH 1.620 08/31/2016 1644   TSH 1.83 12/01/2015 0914    ASSESSMENT AND PLAN: Other hyperlipidemia - Plan: Lipid Panel With LDL/HDL Ratio  Vitamin D deficiency - Plan: VITAMIN D 25 Hydroxy (Vit-D Deficiency, Fractures)  Prediabetes - Plan: Comprehensive metabolic panel, Hemoglobin A1c, Insulin, random  At risk for diabetes mellitus  Class 3 severe obesity with serious comorbidity and body mass index (BMI) of 40.0 to 44.9 in adult, unspecified obesity type (Caroline Gonzales)  PLAN:  Hyperlipidemia Caroline Gonzales was informed of the American Heart Association Guidelines emphasizing intensive lifestyle modifications as the first line treatment for hyperlipidemia. We discussed many lifestyle modifications today in depth, and Caroline Gonzales will continue to work on decreasing saturated fats such as fatty red meat, butter and many fried foods. She will also increase vegetables and lean protein in her diet and continue to work on diet, exercise, and weight loss efforts. We will check labs and Caroline Gonzales agrees to follow up with our clinic in 2 weeks.   Vitamin D Deficiency Caroline Gonzales was informed that low vitamin D levels contributes to fatigue  and are associated with obesity, breast, and colon cancer. Caroline Gonzales agrees to continue taking Vit D and will  follow up for routine testing of vitamin D, at least 2-3 times per year. She was informed of the risk of over-replacement of vitamin D and agrees to not increase her dose unless she discusses this with [name] first. We will check labs and Caroline Gonzales agrees to follow up with our clinic in 2 weeks.  Pre-Diabetes Caroline Gonzales will continue to work on weight loss, diet, exercise, and decreasing simple carbohydrates in her diet to help decrease the risk of diabetes. We dicussed metformin including benefits and risks. She was informed that eating too many simple carbohydrates or too many calories at one sitting increases the likelihood of GI side effects. We will check labs and Caroline Gonzales agrees to follow up with our clinic in 2 weeks as directed to monitor her progress.  Diabetes risk counselling Caroline Gonzales was given extended (15 minutes) diabetes prevention counseling today. She is 42 y.o. female and has risk factors for diabetes including obesity and pre-diabetes. We discussed intensive lifestyle modifications today with an emphasis on weight loss as well as increasing exercise and decreasing simple carbohydrates in her diet.  Obesity Caroline Gonzales is currently in the action stage of change. As such, her goal is to continue with weight loss efforts She has agreed to portion control better and make smarter food choices, such as increase vegetables and decrease simple carbohydrates  and keep a food journal with 1500 calories and 80+ grams of  protein daily Caroline Gonzales has been instructed to work up to a goal of 150 minutes of combined cardio and strengthening exercise per week for weight loss and overall health benefits. We discussed the following Behavioral Modification Strategies today: increasing lean protein intake, decreasing simple carbohydrates , work on meal planning and easy cooking plans and emotional eating strategies We discussed various medication options to help Caroline Gonzales with her weight loss efforts and we both agreed to  increase Saxenda to 1.8 mg (no refill needed) and continue with diet and exercise.  Caroline Gonzales has agreed to follow up with our clinic in 2 weeks. She was informed of the importance of frequent follow up visits to maximize her success with intensive lifestyle modifications for her multiple health conditions.   I, Trixie Dredge, am acting as transcriptionist for Dennard Nip, MD  I have reviewed the above documentation for accuracy and completeness, and I agree with the above. -Dennard Nip, MD

## 2017-10-08 LAB — COMPREHENSIVE METABOLIC PANEL
ALT: 18 IU/L (ref 0–32)
AST: 18 IU/L (ref 0–40)
Albumin/Globulin Ratio: 1.2 (ref 1.2–2.2)
Albumin: 4 g/dL (ref 3.5–5.5)
Alkaline Phosphatase: 87 IU/L (ref 39–117)
BUN/Creatinine Ratio: 18 (ref 9–23)
BUN: 18 mg/dL (ref 6–24)
Bilirubin Total: <0.2 mg/dL (ref 0.0–1.2)
CO2: 24 mmol/L (ref 20–29)
Calcium: 9.4 mg/dL (ref 8.7–10.2)
Chloride: 101 mmol/L (ref 96–106)
Creatinine, Ser: 0.99 mg/dL (ref 0.57–1.00)
GFR calc Af Amer: 81 mL/min/{1.73_m2} (ref >59)
GFR calc non Af Amer: 71 mL/min/{1.73_m2} (ref >59)
Globulin, Total: 3.3 g/dL (ref 1.5–4.5)
Glucose: 88 mg/dL (ref 65–99)
Potassium: 4.8 mmol/L (ref 3.5–5.2)
Sodium: 141 mmol/L (ref 134–144)
Total Protein: 7.3 g/dL (ref 6.0–8.5)

## 2017-10-08 LAB — LIPID PANEL WITH LDL/HDL RATIO
Cholesterol, Total: 162 mg/dL (ref 100–199)
HDL: 42 mg/dL (ref >39)
LDL Calculated: 89 mg/dL (ref 0–99)
LDl/HDL Ratio: 2.1 ratio (ref 0.0–3.2)
Triglycerides: 153 mg/dL — ABNORMAL HIGH (ref 0–149)
VLDL Cholesterol Cal: 31 mg/dL (ref 5–40)

## 2017-10-08 LAB — HEMOGLOBIN A1C
Est. average glucose Bld gHb Est-mCnc: 111 mg/dL
Hgb A1c MFr Bld: 5.5 % (ref 4.8–5.6)

## 2017-10-08 LAB — VITAMIN D 25 HYDROXY (VIT D DEFICIENCY, FRACTURES): Vit D, 25-Hydroxy: 64.8 ng/mL (ref 30.0–100.0)

## 2017-10-08 LAB — INSULIN, RANDOM: INSULIN: 15.6 u[IU]/mL (ref 2.6–24.9)

## 2017-10-11 DIAGNOSIS — M5136 Other intervertebral disc degeneration, lumbar region: Secondary | ICD-10-CM | POA: Diagnosis not present

## 2017-10-11 DIAGNOSIS — M5416 Radiculopathy, lumbar region: Secondary | ICD-10-CM | POA: Diagnosis not present

## 2017-10-11 DIAGNOSIS — Z6841 Body Mass Index (BMI) 40.0 and over, adult: Secondary | ICD-10-CM | POA: Diagnosis not present

## 2017-10-11 MED FILL — HYDROCODON-APAP 5-325: 5-325 | 4 days supply | Qty: 10 | Fill #0

## 2017-10-13 DIAGNOSIS — F331 Major depressive disorder, recurrent, moderate: Secondary | ICD-10-CM | POA: Diagnosis not present

## 2017-10-13 DIAGNOSIS — F4312 Post-traumatic stress disorder, chronic: Secondary | ICD-10-CM | POA: Diagnosis not present

## 2017-10-19 DIAGNOSIS — F4322 Adjustment disorder with anxiety: Secondary | ICD-10-CM | POA: Diagnosis not present

## 2017-10-21 ENCOUNTER — Ambulatory Visit (INDEPENDENT_AMBULATORY_CARE_PROVIDER_SITE_OTHER): Payer: 59 | Admitting: Family Medicine

## 2017-10-21 VITALS — BP 111/72 | HR 101 | Temp 97.9°F | Ht 65.0 in | Wt 257.0 lb

## 2017-10-21 DIAGNOSIS — E8881 Metabolic syndrome: Secondary | ICD-10-CM | POA: Diagnosis not present

## 2017-10-21 DIAGNOSIS — Z6841 Body Mass Index (BMI) 40.0 and over, adult: Secondary | ICD-10-CM

## 2017-10-21 NOTE — Progress Notes (Signed)
Office: [phone]  /  Fax: [phone]   HPI:   Chief Complaint: OBESITY Caroline Gonzales is here to discuss her progress with her obesity treatment plan. She is on the keep a food journal with 1500 calories and 80+ grams of protein daily and is following her eating plan approximately 85 % of the time. She states she is doing 5,000 steps a day 7 times per week. Caroline Gonzales is mostly tracing her protein and meeting her goals most days. She is not journaling calories though. She is getting ready to travel and she is worried about weight gain. Her weight is 257 lb (116.6 kg) today and has had a weight loss of 4 pounds over a period of 2 weeks since her last visit. She has lost 21 lbs since starting treatment with [name].  Insulin Resistance Caroline Gonzales has a diagnosis of insulin resistance based on her elevated fasting insulin level >5. She is doing better on her diet prescription, she is still on Liraglutide and feels it is helping. Although Caroline Gonzales's blood glucose readings are still under good control, insulin resistance puts her at greater risk of metabolic syndrome and diabetes. She is not taking metformin currently and continues to work on diet and exercise to decrease risk of diabetes. She denies nausea, vomiting, and muscle weakness.  ALLERGIES: Allergies  Allergen Reactions  . Tamiflu [Oseltamivir Phosphate] Anaphylaxis  . Cephalosporins Rash  . Latex Rash  . Penicillins Rash    MEDICATIONS: Current Outpatient Medications on File Prior to Visit  Medication Sig Dispense Refill  . Cholecalciferol (VITAMIN D3) 5000 units CAPS Take 5,000 Units by mouth at bedtime.    . clonazePAM (KLONOPIN) 1 MG tablet Take 1 mg by mouth 2 (two) times daily as needed for anxiety. Taking 0.5mg  1-2 tabs BID prn    . ibuprofen (ADVIL,MOTRIN) 200 MG tablet Take 400-600 mg by mouth daily as needed for headache or moderate pain.    . Insulin Pen Needle 31G X 6 MM MISC 1 Package every morning by Does not apply route. 100  each 0  . L-methylfolate Calcium 15 MG TABS Take 15 m by mouth daily.    [name] lamoTRIgine (LAMICTAL) 200 MG tablet Take 200 mg by mouth at bedtime.    [name] levonorgestrel (MIRENA) 20 MCG/24HR IUD 1 each by Intrauterine route once.    . Liraglutide -Weight Management (SAXENDA) 18 MG/3ML SOPN Inject 3 mg into the skin daily. 5 pen 0  . loratadine (CLARITIN) 10 MG tablet Take 10 mg by mouth at bedtime.     . Multiple Vitamin (MULTIVITAMIN) tablet Take 1 tablet by mouth Nightly.    . propranolol (INDERAL) 40 MG tablet Take 40 mg by mouth 2 (two) times daily.     . QUEtiapine (SEROQUEL) 400 MG tablet Take 200 mg at bedtime by mouth.      No current facility-administered medications on file prior to visit.     PAST MEDICAL HISTORY: Past Medical History:  Diagnosis Date  . Allergy    Zyrtec, Allegra.  . Anxiety    followed by Dr. Toy Cookey  . Back pain   . Bipolar 1 disorder ([place])   . Complication of anesthesia   . Depression   . Fibroid   . Gallbladder problem   . Gastroparesis 09/14/2012   gastric emptying study in 2014  . GERD (gastroesophageal reflux disease)   . Pneumonia    2013ish  . PONV (postoperative nausea and vomiting)   . Pre-diabetes   . PTSD (post-traumatic stress  disorder)   . Vitamin D deficiency     PAST SURGICAL HISTORY: Past Surgical History:  Procedure Laterality Date  . CESAREAN SECTION    . CHOLECYSTECTOMY    . DILATION AND CURETTAGE OF UTERUS    . FOREIGN BODY REMOVAL Left 11/18/2016   Procedure: REMOVAL FOREIGN BODY EXTREMITY LEFT FOOT;  Surgeon: Trula Slade, DPM;  Location: [facility];  Service: Podiatry;  Laterality: Left;  . PILONIDAL CYST EXCISION  1990's  . TENDON REPAIR Left    Left Ankle  . WISDOM TOOTH EXTRACTION  19090's    SOCIAL HISTORY: Social History   Tobacco Use  . Smoking status: Former Smoker    Years: 10.00    Types: Cigarettes    Last attempt to quit: 2003    Years since quitting: 16.1  . Smokeless tobacco: Never Used  Substance  Use Topics  . Alcohol use: Yes    Alcohol/week: 0.0 oz    Comment: 1-2 per month   . Drug use: No    FAMILY HISTORY: Family History  Problem Relation Age of Onset  . Cancer Mother        squamous cell carcinoma  . Hyperlipidemia Mother   . Depression Mother   . Anxiety disorder Mother   . Obesity Mother   . Heart disease Father        cardiomegaly, CHF  . Hyperlipidemia Father   . Hypertension Father   . Mental retardation Father   . High blood pressure Father   . Depression Father   . Anxiety disorder Father   . Obesity Father   . Cancer Maternal Grandmother   . Diabetes Maternal Grandmother   . Heart disease Maternal Grandmother   . Hyperlipidemia Maternal Grandmother   . Hypertension Maternal Grandmother   . Stroke Maternal Grandmother   . Heart disease Paternal Grandmother   . Hypertension Paternal Grandmother   . Heart disease Paternal Grandfather   . Hyperlipidemia Paternal Grandfather   . Mental illness Paternal Grandfather   . Breast cancer Maternal Aunt   . Thyroid cancer Paternal Aunt     ROS: Review of Systems  Constitutional: Positive for weight loss.  Gastrointestinal: Negative for nausea and vomiting.  Musculoskeletal:       Negative muscle weakness    PHYSICAL EXAM: Blood pressure 111/72, pulse (!) 101, temperature 97.9 F (36.6 C), temperature source Oral, height 5\' 5"  (1.651 m), weight 257 lb (116.6 kg), SpO2 97 %. Body mass index is 42.77 kg/m. Physical Exam  Constitutional: She is oriented to person, place, and time. She appears well-developed and well-nourished.  Cardiovascular: Normal rate.  Pulmonary/Chest: Effort normal.  Musculoskeletal: Normal range of motion.  Neurological: She is oriented to person, place, and time.  Skin: Skin is warm and dry.  Psychiatric: She has a normal mood and affect. Her behavior is normal.  Vitals reviewed.   RECENT LABS AND TESTS: BMET    Component Value Date/Time   NA 141 10/07/2017 1255   K  4.8 10/07/2017 1255   CL 101 10/07/2017 1255   CO2 24 10/07/2017 1255   GLUCOSE 88 10/07/2017 1255   GLUCOSE 87 06/27/2016 0933   BUN 18 10/07/2017 1255   CREATININE 0.99 10/07/2017 1255   CREATININE 0.95 06/27/2016 0933   CALCIUM 9.4 10/07/2017 1255   GFRNONAA 71 10/07/2017 1255   GFRAA 81 10/07/2017 1255   Lab Results  Component Value Date   HGBA1C 5.5 10/07/2017   HGBA1C 5.1 06/22/2017   HGBA1C 5.3  01/11/2017   HGBA1C 5.7 (H) 08/31/2016   HGBA1C 5.6 12/01/2015   Lab Results  Component Value Date   INSULIN 15.6 10/07/2017   INSULIN 25.2 (H) 06/22/2017   INSULIN 17.0 01/11/2017   INSULIN 17.4 08/31/2016   CBC    Component Value Date/Time   WBC 5.2 06/22/2017 0904   WBC 8.1 06/27/2016 0941   WBC 9.9 04/30/2016 0947   RBC 4.07 06/22/2017 0904   RBC 3.96 (A) 06/27/2016 0941   RBC 4.15 04/30/2016 0947   HGB 11.2 06/22/2017 0904   HCT 34.8 06/22/2017 0904   PLT 332 11/12/2016 1651   MCV 86 06/22/2017 0904   MCH 27.5 06/22/2017 0904   MCH 29.0 06/27/2016 0941   MCH 28.2 04/30/2016 0947   MCHC 32.2 06/22/2017 0904   MCHC 34.5 06/27/2016 0941   MCHC 33.2 04/30/2016 0947   RDW 15.7 (H) 06/22/2017 0904   LYMPHSABS 1.5 06/22/2017 0904   MONOABS 891 04/30/2016 0947   EOSABS 0.2 06/22/2017 0904   BASOSABS 0.0 06/22/2017 0904   Iron/TIBC/Ferritin/ %Sat    Component Value Date/Time   IRON 53 08/31/2016 1644   TIBC 280 08/31/2016 1644   FERRITIN 36 08/31/2016 1644   IRONPCTSAT 19 08/31/2016 1644   Lipid Panel     Component Value Date/Time   CHOL 162 10/07/2017 1255   TRIG 153 (H) 10/07/2017 1255   HDL 42 10/07/2017 1255   CHOLHDL 4.1 12/01/2015 0914   VLDL 37 (H) 12/01/2015 0914   LDLCALC 89 10/07/2017 1255   Hepatic Function Panel     Component Value Date/Time   PROT 7.3 10/07/2017 1255   ALBUMIN 4.0 10/07/2017 1255   AST 18 10/07/2017 1255   ALT 18 10/07/2017 1255   ALKPHOS 87 10/07/2017 1255   BILITOT <0.2 10/07/2017 1255      Component Value  Date/Time   TSH 1.620 08/31/2016 1644   TSH 1.83 12/01/2015 0914    ASSESSMENT AND PLAN: Insulin resistance  Class 3 severe obesity with serious comorbidity and body mass index (BMI) of 40.0 to 44.9 in adult, unspecified obesity type ([place])  PLAN:  Insulin Resistance Caroline Gonzales will continue to work on weight loss, diet, exercise, and decreasing simple carbohydrates in her diet to help decrease the risk of diabetes. We dicussed metformin including benefits and risks. She was informed that eating too many simple carbohydrates or too many calories at one sitting increases the likelihood of GI side effects. Caroline Gonzales declined metformin for now and prescription was not written today. Caroline Gonzales's insurance will no longer pay for Liraglutide, so when she runs out we will change to Ozempic at that time. Caroline Gonzales agrees to follow up with our clinic in 2 weeks as directed to monitor her progress.  We spent > than 50% of the 15 minute visit on the counseling as documented in the note.  Obesity Caroline Gonzales is currently in the action stage of change. As such, her goal is to continue with weight loss efforts She has agreed to keep a food journal with 1500 calories and 90 grams of protein daily Caroline Gonzales has been instructed to work up to a goal of 150 minutes of combined cardio and strengthening exercise per week for weight loss and overall health benefits. We discussed the following Behavioral Modification Strategies today: increasing lean protein intake, work on meal planning and easy cooking plans, travel eating strategies, and celebration eating strategies   Caroline Gonzales has agreed to follow up with our clinic in 2 weeks. She was informed of  the importance of frequent follow up visits to maximize her success with intensive lifestyle modifications for her multiple health conditions.   I, Trixie Dredge, am acting as transcriptionist for Dennard Nip, MD  I have reviewed the above documentation for accuracy and  completeness, and I agree with the above. -Dennard Nip, MD

## 2017-10-27 ENCOUNTER — Encounter (INDEPENDENT_AMBULATORY_CARE_PROVIDER_SITE_OTHER): Payer: Self-pay

## 2017-10-27 ENCOUNTER — Ambulatory Visit (INDEPENDENT_AMBULATORY_CARE_PROVIDER_SITE_OTHER): Payer: 59 | Admitting: Family Medicine

## 2017-10-28 ENCOUNTER — Ambulatory Visit (INDEPENDENT_AMBULATORY_CARE_PROVIDER_SITE_OTHER): Payer: 59 | Admitting: Family Medicine

## 2017-10-28 VITALS — BP 115/74 | HR 81 | Temp 97.9°F | Ht 65.0 in | Wt 258.0 lb

## 2017-10-28 DIAGNOSIS — E8881 Metabolic syndrome: Secondary | ICD-10-CM

## 2017-10-28 DIAGNOSIS — E88819 Insulin resistance, unspecified: Secondary | ICD-10-CM

## 2017-10-28 DIAGNOSIS — Z6841 Body Mass Index (BMI) 40.0 and over, adult: Secondary | ICD-10-CM

## 2017-10-28 DIAGNOSIS — E559 Vitamin D deficiency, unspecified: Secondary | ICD-10-CM

## 2017-10-28 DIAGNOSIS — Z9189 Other specified personal risk factors, not elsewhere classified: Secondary | ICD-10-CM | POA: Diagnosis not present

## 2017-10-28 MED ORDER — SEMAGLUTIDE(0.25 OR 0.5MG/DOS) 2 MG/1.5ML ~~LOC~~ SOPN: 0.5000 mg | PEN_INJECTOR | SUBCUTANEOUS | 0 refills | Status: DC

## 2017-11-01 NOTE — Progress Notes (Signed)
Office: [phone]  /  Fax: [phone]   HPI:   Chief Complaint: OBESITY Caroline Gonzales is here to discuss her progress with her obesity treatment plan. She is on the keep a food journal with 1500 calories and 90 grams of protein daily and is following her eating plan approximately 60 % of the time. She states she is exercising 7,000 steps 7 times per week. Caroline Gonzales has been ill with upper respiratory infection since her last visit. Caroline Gonzales is feeling like a failure for not losing more weight. She is traveling to [place] in 3 days. Caroline Gonzales is only hitting her protein goal 60 to 70% of the time secondary to illness. She has a list of foods she wants to eat when she is at [place]. Her weight is 258 lb (117 kg) today and has had a weight gain of 1 pound over a period of 1 week since her last visit. She has lost 20 lbs since starting treatment with [name].  Insulin Resistance Caroline Gonzales has a diagnosis of insulin resistance based on her elevated fasting insulin level >5. Although Caroline Gonzales's blood glucose readings are still under good control, insulin resistance puts her at greater risk of metabolic syndrome and diabetes. Insurance declined [name], so we will change to Ozempic 0.5 mg once weekly then increase to 1.0 mg after 1 month. [name] continues to work on diet and exercise to decrease risk of diabetes.  At risk for diabetes Caroline Gonzales is at higher than average risk for developing diabetes due to her obesity and insulin resistance. She currently denies polyuria or polydipsia.  Vitamin D deficiency Caroline Gonzales has a diagnosis of vitamin D deficiency. She is currently taking OTC vit D and denies nausea, vomiting or muscle weakness.   Ref. Range 10/07/2017 12:55  Vitamin D, 25-Hydroxy Latest Ref Range: 30.0 - 100.0 ng/mL 64.8   ALLERGIES: Allergies  Allergen Reactions  . Tamiflu [Oseltamivir Phosphate] Anaphylaxis  . Cephalosporins Rash  . Latex Rash  . Penicillins Rash    MEDICATIONS: Current  Outpatient Medications on File Prior to Visit  Medication Sig Dispense Refill  . Cholecalciferol (VITAMIN D3) 5000 units CAPS Take 5,000 Units by mouth at bedtime.    . clonazePAM (KLONOPIN) 1 MG tablet Take 1 mg by mouth 2 (two) times daily as needed for anxiety. Taking 0.5mg  1-2 tabs BID prn    . ibuprofen (ADVIL,MOTRIN) 200 MG tablet Take 400-600 mg by mouth daily as needed for headache or moderate pain.    . Insulin Pen Needle 31G X 6 MM MISC 1 Package every morning by Does not apply route. 100 each 0  . L-methylfolate Calcium 15 MG TABS Take 15 m by mouth daily.    [name] lamoTRIgine (LAMICTAL) 200 MG tablet Take 200 mg by mouth at bedtime.    [name] levonorgestrel (MIRENA) 20 MCG/24HR IUD 1 each by Intrauterine route once.    . loratadine (CLARITIN) 10 MG tablet Take 10 mg by mouth at bedtime.     . Multiple Vitamin (MULTIVITAMIN) tablet Take 1 tablet by mouth Nightly.    . propranolol (INDERAL) 40 MG tablet Take 40 mg by mouth 2 (two) times daily.     . QUEtiapine (SEROQUEL) 400 MG tablet Take 200 mg at bedtime by mouth.      No current facility-administered medications on file prior to visit.     PAST MEDICAL HISTORY: Past Medical History:  Diagnosis Date  . Allergy    Zyrtec, Allegra.  . Anxiety    followed by Dr. Toy Cookey  .  Back pain   . Bipolar 1 disorder ([place])   . Complication of anesthesia   . Depression   . Fibroid   . Gallbladder problem   . Gastroparesis 09/14/2012   gastric emptying study in 2014  . GERD (gastroesophageal reflux disease)   . Pneumonia    2013ish  . PONV (postoperative nausea and vomiting)   . Pre-diabetes   . PTSD (post-traumatic stress disorder)   . Vitamin D deficiency     PAST SURGICAL HISTORY: Past Surgical History:  Procedure Laterality Date  . CESAREAN SECTION    . CHOLECYSTECTOMY    . DILATION AND CURETTAGE OF UTERUS    . FOREIGN BODY REMOVAL Left 11/18/2016   Procedure: REMOVAL FOREIGN BODY EXTREMITY LEFT FOOT;  Surgeon: Trula Slade,  DPM;  Location: [facility];  Service: Podiatry;  Laterality: Left;  . PILONIDAL CYST EXCISION  1990's  . TENDON REPAIR Left    Left Ankle  . WISDOM TOOTH EXTRACTION  19090's    SOCIAL HISTORY: Social History   Tobacco Use  . Smoking status: Former Smoker    Years: 10.00    Types: Cigarettes    Last attempt to quit: 2003    Years since quitting: 16.1  . Smokeless tobacco: Never Used  Substance Use Topics  . Alcohol use: Yes    Alcohol/week: 0.0 oz    Comment: 1-2 per month   . Drug use: No    FAMILY HISTORY: Family History  Problem Relation Age of Onset  . Cancer Mother        squamous cell carcinoma  . Hyperlipidemia Mother   . Depression Mother   . Anxiety disorder Mother   . Obesity Mother   . Heart disease Father        cardiomegaly, CHF  . Hyperlipidemia Father   . Hypertension Father   . Mental retardation Father   . High blood pressure Father   . Depression Father   . Anxiety disorder Father   . Obesity Father   . Cancer Maternal Grandmother   . Diabetes Maternal Grandmother   . Heart disease Maternal Grandmother   . Hyperlipidemia Maternal Grandmother   . Hypertension Maternal Grandmother   . Stroke Maternal Grandmother   . Heart disease Paternal Grandmother   . Hypertension Paternal Grandmother   . Heart disease Paternal Grandfather   . Hyperlipidemia Paternal Grandfather   . Mental illness Paternal Grandfather   . Breast cancer Maternal Aunt   . Thyroid cancer Paternal Aunt     ROS: Review of Systems  Constitutional: Negative for weight loss.  Gastrointestinal: Negative for nausea and vomiting.  Genitourinary: Negative for frequency.  Musculoskeletal:       Negative muscle weakness  Endo/Heme/Allergies: Negative for polydipsia.    PHYSICAL EXAM: Blood pressure 115/74, pulse 81, temperature 97.9 F (36.6 C), temperature source Oral, height 5\' 5"  (1.651 m), weight 258 lb (117 kg), SpO2 99 %. Body mass index is 42.93 kg/m. Physical Exam    Constitutional: She is oriented to person, place, and time. She appears well-developed and well-nourished.  Cardiovascular: Normal rate.  Pulmonary/Chest: Effort normal.  Musculoskeletal: Normal range of motion.  Neurological: She is oriented to person, place, and time.  Skin: Skin is warm and dry.  Psychiatric: She has a normal mood and affect. Her behavior is normal.  Vitals reviewed.   RECENT LABS AND TESTS: BMET    Component Value Date/Time   NA 141 10/07/2017 1255   K 4.8 10/07/2017 1255  CL 101 10/07/2017 1255   CO2 24 10/07/2017 1255   GLUCOSE 88 10/07/2017 1255   GLUCOSE 87 06/27/2016 0933   BUN 18 10/07/2017 1255   CREATININE 0.99 10/07/2017 1255   CREATININE 0.95 06/27/2016 0933   CALCIUM 9.4 10/07/2017 1255   GFRNONAA 71 10/07/2017 1255   GFRAA 81 10/07/2017 1255   Lab Results  Component Value Date   HGBA1C 5.5 10/07/2017   HGBA1C 5.1 06/22/2017   HGBA1C 5.3 01/11/2017   HGBA1C 5.7 (H) 08/31/2016   HGBA1C 5.6 12/01/2015   Lab Results  Component Value Date   INSULIN 15.6 10/07/2017   INSULIN 25.2 (H) 06/22/2017   INSULIN 17.0 01/11/2017   INSULIN 17.4 08/31/2016   CBC    Component Value Date/Time   WBC 5.2 06/22/2017 0904   WBC 8.1 06/27/2016 0941   WBC 9.9 04/30/2016 0947   RBC 4.07 06/22/2017 0904   RBC 3.96 (A) 06/27/2016 0941   RBC 4.15 04/30/2016 0947   HGB 11.2 06/22/2017 0904   HCT 34.8 06/22/2017 0904   PLT 332 11/12/2016 1651   MCV 86 06/22/2017 0904   MCH 27.5 06/22/2017 0904   MCH 29.0 06/27/2016 0941   MCH 28.2 04/30/2016 0947   MCHC 32.2 06/22/2017 0904   MCHC 34.5 06/27/2016 0941   MCHC 33.2 04/30/2016 0947   RDW 15.7 (H) 06/22/2017 0904   LYMPHSABS 1.5 06/22/2017 0904   MONOABS 891 04/30/2016 0947   EOSABS 0.2 06/22/2017 0904   BASOSABS 0.0 06/22/2017 0904   Iron/TIBC/Ferritin/ %Sat    Component Value Date/Time   IRON 53 08/31/2016 1644   TIBC 280 08/31/2016 1644   FERRITIN 36 08/31/2016 1644   IRONPCTSAT 19  08/31/2016 1644   Lipid Panel     Component Value Date/Time   CHOL 162 10/07/2017 1255   TRIG 153 (H) 10/07/2017 1255   HDL 42 10/07/2017 1255   CHOLHDL 4.1 12/01/2015 0914   VLDL 37 (H) 12/01/2015 0914   LDLCALC 89 10/07/2017 1255   Hepatic Function Panel     Component Value Date/Time   PROT 7.3 10/07/2017 1255   ALBUMIN 4.0 10/07/2017 1255   AST 18 10/07/2017 1255   ALT 18 10/07/2017 1255   ALKPHOS 87 10/07/2017 1255   BILITOT <0.2 10/07/2017 1255      Component Value Date/Time   TSH 1.620 08/31/2016 1644   TSH 1.83 12/01/2015 0914     Ref. Range 10/07/2017 12:55  Vitamin D, 25-Hydroxy Latest Ref Range: 30.0 - 100.0 ng/mL 64.8   ASSESSMENT AND PLAN: Insulin resistance - Plan: Semaglutide (OZEMPIC) 0.25 or 0.5 MG/DOSE SOPN  Vitamin D deficiency  At risk for diabetes mellitus  Class 3 severe obesity with serious comorbidity and body mass index (BMI) of 40.0 to 44.9 in adult, unspecified obesity type ([place])  PLAN:  Insulin Resistance Timia will continue to work on weight loss, exercise, and decreasing simple carbohydrates in her diet to help decrease the risk of diabetes. We dicussed metformin including benefits and risks. She was informed that eating too many simple carbohydrates or too many calories at one sitting increases the likelihood of GI side effects. Edithe agrees to start Ozempic 0.5 mg inj into skin weekly #1 pen with no refills when Saxenda runs out. Marla agreed to follow up with [name] as directed to monitor her progress.  Diabetes risk counseling Walsie was given extended (15 minutes) diabetes prevention counseling today. She is 42 y.o. female and has risk factors for diabetes including obesity and insulin resistance.  We discussed intensive lifestyle modifications today with an emphasis on weight loss as well as increasing exercise and decreasing simple carbohydrates in her diet.  Vitamin D Deficiency Birdella was informed that low vitamin D levels  contributes to fatigue and are associated with obesity, breast, and colon cancer. She agrees to continue to take OTC Vit D @5 ,000 IU every day and will follow up for routine testing of vitamin D, at least 2-3 times per year. She was informed of the risk of over-replacement of vitamin D and agrees to not increase her dose unless she discusses this with [name] first.  Obesity Amandy is currently in the action stage of change. As such, her goal is to continue with weight loss efforts She has agreed to keep a food journal with 1500 calories and 100 grams of protein daily Ayde has been instructed to work up to a goal of 150 minutes of combined cardio and strengthening exercise per week for weight loss and overall health benefits. We discussed the following Behavioral Modification Strategies today: increase H2O intake, keep a strict food journal, increasing lean protein intake and increasing vegetables  Chrisha has agreed to follow up with our clinic in 2 weeks. She was informed of the importance of frequent follow up visits to maximize her success with intensive lifestyle modifications for her multiple health conditions.  I, Doreene Nest, am acting as transcriptionist for Eber Jones, MD  I have reviewed the above documentation for accuracy and completeness, and I agree with the above. - Ilene Qua, MD

## 2017-11-04 ENCOUNTER — Encounter (INDEPENDENT_AMBULATORY_CARE_PROVIDER_SITE_OTHER): Payer: Self-pay | Admitting: Family Medicine

## 2017-11-04 MED FILL — PANTOPRAZOLE SOD DR 40 MG T: 40 | 30 days supply | Qty: 30 | Fill #4

## 2017-11-09 DIAGNOSIS — F4322 Adjustment disorder with anxiety: Secondary | ICD-10-CM | POA: Diagnosis not present

## 2017-11-09 DIAGNOSIS — F4310 Post-traumatic stress disorder, unspecified: Secondary | ICD-10-CM | POA: Diagnosis not present

## 2017-11-11 ENCOUNTER — Ambulatory Visit (INDEPENDENT_AMBULATORY_CARE_PROVIDER_SITE_OTHER): Payer: 59 | Admitting: Family Medicine

## 2017-11-11 VITALS — BP 106/68 | HR 88 | Temp 97.5°F | Ht 65.0 in | Wt 255.0 lb

## 2017-11-11 DIAGNOSIS — Z9189 Other specified personal risk factors, not elsewhere classified: Secondary | ICD-10-CM

## 2017-11-11 DIAGNOSIS — Z6841 Body Mass Index (BMI) 40.0 and over, adult: Secondary | ICD-10-CM

## 2017-11-11 DIAGNOSIS — E8881 Metabolic syndrome: Secondary | ICD-10-CM

## 2017-11-11 DIAGNOSIS — E559 Vitamin D deficiency, unspecified: Secondary | ICD-10-CM

## 2017-11-11 MED ORDER — METFORMIN HCL 500 MG PO TABS: 500.0000 mg | ORAL_TABLET | Freq: Every day | ORAL | 0 refills | Status: DC

## 2017-11-11 MED FILL — ZALEPLON 10 MG CAPSULE: 10 | 30 days supply | Qty: 30 | Fill #0

## 2017-11-11 MED FILL — metFORMIN HCL 500 MG TABS: 500 | 30 days supply | Qty: 30 | Fill #0

## 2017-11-11 MED FILL — clonazePAM 0.5 MG TABS: 0.5 | 30 days supply | Qty: 120 | Fill #0

## 2017-11-11 NOTE — Progress Notes (Signed)
Office: [phone]  /  Fax: [phone]   HPI:   Chief Complaint: OBESITY Caroline Gonzales is here to discuss her progress with her obesity treatment plan. She is on the keep a food journal with 1500 calories and 100 grams of protein daily and is following her eating plan approximately 50 % of the time. She states she is walking 8,000 steps per day. Caroline Gonzales returned home from [place]. She really indulged and enjoyed her trip. Her weight is 255 lb (115.7 kg) today and has had a weight loss of 3 pounds over a period of 2 weeks since her last visit. She has lost 23 lbs since starting treatment with [name].  Insulin Resistance Caroline Gonzales has a diagnosis of insulin resistance based on her elevated fasting insulin level >5. Although Caroline Gonzales's blood glucose readings are still under good control, insulin resistance puts her at greater risk of metabolic syndrome and diabetes. Caroline Gonzales is no longer covered by insurance and neither is Ozempic. She admits polyphagia and denies polyuria or polydipsia. Caroline Gonzales continues to work on diet and exercise to decrease risk of diabetes.  At risk for diabetes Caroline Gonzales is at higher than average risk for developing diabetes due to her obesity and insulin resistance. She currently denies polyuria or polydipsia.  Vitamin D deficiency Caroline Gonzales has a diagnosis of vitamin D deficiency. She is currently taking OTC vit D and admits fatigue but denies nausea, vomiting or muscle weakness.   Ref. Range 10/07/2017 12:55  Vitamin D, 25-Hydroxy Latest Ref Range: 30.0 - 100.0 ng/mL 64.8   ALLERGIES: Allergies  Allergen Reactions  . Tamiflu [Oseltamivir Phosphate] Anaphylaxis  . Cephalosporins Rash  . Latex Rash  . Penicillins Rash    MEDICATIONS: Current Outpatient Medications on File Prior to Visit  Medication Sig Dispense Refill  . Cholecalciferol (VITAMIN D3) 5000 units CAPS Take 5,000 Units by mouth at bedtime.    . clonazePAM (KLONOPIN) 1 MG tablet Take 1 mg by mouth 2 (two)  times daily as needed for anxiety. Taking 0.5mg  1-2 tabs BID prn    . ibuprofen (ADVIL,MOTRIN) 200 MG tablet Take 400-600 mg by mouth daily as needed for headache or moderate pain.    . L-methylfolate Calcium 15 MG TABS Take 15 m by mouth daily.    [name] lamoTRIgine (LAMICTAL) 200 MG tablet Take 200 mg by mouth at bedtime.    [name] levonorgestrel (MIRENA) 20 MCG/24HR IUD 1 each by Intrauterine route once.    . loratadine (CLARITIN) 10 MG tablet Take 10 mg by mouth at bedtime.     . Multiple Vitamin (MULTIVITAMIN) tablet Take 1 tablet by mouth Nightly.    . pantoprazole (PROTONIX) 40 MG tablet Take 40 mg by mouth daily.    . propranolol (INDERAL) 40 MG tablet Take 40 mg by mouth 2 (two) times daily.     . QUEtiapine (SEROQUEL) 400 MG tablet Take 200 mg at bedtime by mouth.      No current facility-administered medications on file prior to visit.     PAST MEDICAL HISTORY: Past Medical History:  Diagnosis Date  . Allergy    Zyrtec, Allegra.  . Anxiety    followed by Dr. Toy Cookey  . Back pain   . Bipolar 1 disorder ([place])   . Complication of anesthesia   . Depression   . Fibroid   . Gallbladder problem   . Gastroparesis 09/14/2012   gastric emptying study in 2014  . GERD (gastroesophageal reflux disease)   . Pneumonia    2013ish  .  PONV (postoperative nausea and vomiting)   . Pre-diabetes   . PTSD (post-traumatic stress disorder)   . Vitamin D deficiency     PAST SURGICAL HISTORY: Past Surgical History:  Procedure Laterality Date  . CESAREAN SECTION    . CHOLECYSTECTOMY    . DILATION AND CURETTAGE OF UTERUS    . FOREIGN BODY REMOVAL Left 11/18/2016   Procedure: REMOVAL FOREIGN BODY EXTREMITY LEFT FOOT;  Surgeon: Trula Slade, DPM;  Location: [facility];  Service: Podiatry;  Laterality: Left;  . PILONIDAL CYST EXCISION  1990's  . TENDON REPAIR Left    Left Ankle  . WISDOM TOOTH EXTRACTION  19090's    SOCIAL HISTORY: Social History   Tobacco Use  . Smoking status: Former Smoker     Years: 10.00    Types: Cigarettes    Last attempt to quit: 2003    Years since quitting: 16.1  . Smokeless tobacco: Never Used  Substance Use Topics  . Alcohol use: Yes    Alcohol/week: 0.0 oz    Comment: 1-2 per month   . Drug use: No    FAMILY HISTORY: Family History  Problem Relation Age of Onset  . Cancer Mother        squamous cell carcinoma  . Hyperlipidemia Mother   . Depression Mother   . Anxiety disorder Mother   . Obesity Mother   . Heart disease Father        cardiomegaly, CHF  . Hyperlipidemia Father   . Hypertension Father   . Mental retardation Father   . High blood pressure Father   . Depression Father   . Anxiety disorder Father   . Obesity Father   . Cancer Maternal Grandmother   . Diabetes Maternal Grandmother   . Heart disease Maternal Grandmother   . Hyperlipidemia Maternal Grandmother   . Hypertension Maternal Grandmother   . Stroke Maternal Grandmother   . Heart disease Paternal Grandmother   . Hypertension Paternal Grandmother   . Heart disease Paternal Grandfather   . Hyperlipidemia Paternal Grandfather   . Mental illness Paternal Grandfather   . Breast cancer Maternal Aunt   . Thyroid cancer Paternal Aunt     ROS: Review of Systems  Constitutional: Positive for malaise/fatigue and weight loss.  Gastrointestinal: Negative for nausea and vomiting.  Genitourinary: Negative for frequency.  Musculoskeletal:       Negative for muscle weakness  Endo/Heme/Allergies: Negative for polydipsia.       Positive for polyphagia    PHYSICAL EXAM: Blood pressure 106/68, pulse 88, temperature (!) 97.5 F (36.4 C), temperature source Oral, height 5\' 5"  (1.651 m), weight 255 lb (115.7 kg), SpO2 98 %. Body mass index is 42.43 kg/m. Physical Exam  Constitutional: She is oriented to person, place, and time. She appears well-developed and well-nourished.  Cardiovascular: Normal rate.  Pulmonary/Chest: Effort normal.  Musculoskeletal: Normal range of  motion.  Neurological: She is oriented to person, place, and time.  Skin: Skin is warm and dry.  Psychiatric: She has a normal mood and affect. Her behavior is normal.  Vitals reviewed.   RECENT LABS AND TESTS: BMET    Component Value Date/Time   NA 141 10/07/2017 1255   K 4.8 10/07/2017 1255   CL 101 10/07/2017 1255   CO2 24 10/07/2017 1255   GLUCOSE 88 10/07/2017 1255   GLUCOSE 87 06/27/2016 0933   BUN 18 10/07/2017 1255   CREATININE 0.99 10/07/2017 1255   CREATININE 0.95 06/27/2016 0933   CALCIUM  9.4 10/07/2017 1255   GFRNONAA 71 10/07/2017 1255   GFRAA 81 10/07/2017 1255   Lab Results  Component Value Date   HGBA1C 5.5 10/07/2017   HGBA1C 5.1 06/22/2017   HGBA1C 5.3 01/11/2017   HGBA1C 5.7 (H) 08/31/2016   HGBA1C 5.6 12/01/2015   Lab Results  Component Value Date   INSULIN 15.6 10/07/2017   INSULIN 25.2 (H) 06/22/2017   INSULIN 17.0 01/11/2017   INSULIN 17.4 08/31/2016   CBC    Component Value Date/Time   WBC 5.2 06/22/2017 0904   WBC 8.1 06/27/2016 0941   WBC 9.9 04/30/2016 0947   RBC 4.07 06/22/2017 0904   RBC 3.96 (A) 06/27/2016 0941   RBC 4.15 04/30/2016 0947   HGB 11.2 06/22/2017 0904   HCT 34.8 06/22/2017 0904   PLT 332 11/12/2016 1651   MCV 86 06/22/2017 0904   MCH 27.5 06/22/2017 0904   MCH 29.0 06/27/2016 0941   MCH 28.2 04/30/2016 0947   MCHC 32.2 06/22/2017 0904   MCHC 34.5 06/27/2016 0941   MCHC 33.2 04/30/2016 0947   RDW 15.7 (H) 06/22/2017 0904   LYMPHSABS 1.5 06/22/2017 0904   MONOABS 891 04/30/2016 0947   EOSABS 0.2 06/22/2017 0904   BASOSABS 0.0 06/22/2017 0904   Iron/TIBC/Ferritin/ %Sat    Component Value Date/Time   IRON 53 08/31/2016 1644   TIBC 280 08/31/2016 1644   FERRITIN 36 08/31/2016 1644   IRONPCTSAT 19 08/31/2016 1644   Lipid Panel     Component Value Date/Time   CHOL 162 10/07/2017 1255   TRIG 153 (H) 10/07/2017 1255   HDL 42 10/07/2017 1255   CHOLHDL 4.1 12/01/2015 0914   VLDL 37 (H) 12/01/2015 0914    LDLCALC 89 10/07/2017 1255   Hepatic Function Panel     Component Value Date/Time   PROT 7.3 10/07/2017 1255   ALBUMIN 4.0 10/07/2017 1255   AST 18 10/07/2017 1255   ALT 18 10/07/2017 1255   ALKPHOS 87 10/07/2017 1255   BILITOT <0.2 10/07/2017 1255      Component Value Date/Time   TSH 1.620 08/31/2016 1644   TSH 1.83 12/01/2015 0914     Ref. Range 10/07/2017 12:55  Vitamin D, 25-Hydroxy Latest Ref Range: 30.0 - 100.0 ng/mL 64.8   ASSESSMENT AND PLAN: Insulin resistance - Plan: metFORMIN (GLUCOPHAGE) 500 MG tablet  Vitamin D deficiency  At risk for diabetes mellitus  Class 3 severe obesity with serious comorbidity and body mass index (BMI) of 40.0 to 44.9 in adult, unspecified obesity type ([place])  PLAN:  Insulin Resistance Shantay will continue to work on weight loss, exercise, and decreasing simple carbohydrates in her diet to help decrease the risk of diabetes. We dicussed metformin including benefits and risks. She was informed that eating too many simple carbohydrates or too many calories at one sitting increases the likelihood of GI side effects. Kewanda agrees to start 500 mg qam #30 with no refills and follow up with [name] as directed to monitor her progress.  Diabetes risk counseling Takyah was given extended (15 minutes) diabetes prevention counseling today. She is 42 y.o. female and has risk factors for diabetes including obesity and insulin resistance. We discussed intensive lifestyle modifications today with an emphasis on weight loss as well as increasing exercise and decreasing simple carbohydrates in her diet.  Vitamin D Deficiency Caroline Gonzales was informed that low vitamin D levels contributes to fatigue and are associated with obesity, breast, and colon cancer. She agrees to continue to take OTC Vit  D @5 ,000 IU every day. We will need to recheck vitamin D level in the next 1 to 2 months and Caroline Gonzales will follow up for routine testing of vitamin D, at least 2-3 times per  year. She was informed of the risk of over-replacement of vitamin D and agrees to not increase her dose unless she discusses this with [name] first.  Obesity Caroline Gonzales is currently in the action stage of change. As such, her goal is to continue with weight loss efforts She has agreed to keep a food journal with 1500 calories and 100 grams of protein daily Caroline Gonzales has been instructed to work up to a goal of 150 minutes of combined cardio and strengthening exercise per week for weight loss and overall health benefits. We discussed the following Behavioral Modification Strategies today: increasing lean protein intake, planning for success and keep a strict food journal  Caroline Gonzales has agreed to follow up with our clinic in 2 weeks. She was informed of the importance of frequent follow up visits to maximize her success with intensive lifestyle modifications for her multiple health conditions.  I, Doreene Nest, am acting as transcriptionist for Eber Jones, MD  I have reviewed the above documentation for accuracy and completeness, and I agree with the above. - Ilene Qua, MD

## 2017-11-12 DIAGNOSIS — R030 Elevated blood-pressure reading, without diagnosis of hypertension: Secondary | ICD-10-CM | POA: Diagnosis not present

## 2017-11-12 DIAGNOSIS — Z6841 Body Mass Index (BMI) 40.0 and over, adult: Secondary | ICD-10-CM | POA: Diagnosis not present

## 2017-11-12 DIAGNOSIS — M5136 Other intervertebral disc degeneration, lumbar region: Secondary | ICD-10-CM | POA: Diagnosis not present

## 2017-11-12 DIAGNOSIS — M5416 Radiculopathy, lumbar region: Secondary | ICD-10-CM | POA: Diagnosis not present

## 2017-11-12 MED FILL — GABAPENTIN 100 MG CAP: 100 | 30 days supply | Qty: 270 | Fill #0

## 2017-11-23 MED FILL — PROPRANOLOL 40 MG TABLET: 40 | 30 days supply | Qty: 60 | Fill #0

## 2017-11-25 ENCOUNTER — Ambulatory Visit (INDEPENDENT_AMBULATORY_CARE_PROVIDER_SITE_OTHER): Payer: 59 | Admitting: Family Medicine

## 2017-11-25 VITALS — BP 110/73 | HR 78 | Temp 97.5°F | Ht 65.0 in | Wt 260.0 lb

## 2017-11-25 DIAGNOSIS — F4312 Post-traumatic stress disorder, chronic: Secondary | ICD-10-CM | POA: Diagnosis not present

## 2017-11-25 DIAGNOSIS — R7303 Prediabetes: Secondary | ICD-10-CM | POA: Diagnosis not present

## 2017-11-25 DIAGNOSIS — Z6841 Body Mass Index (BMI) 40.0 and over, adult: Secondary | ICD-10-CM

## 2017-11-25 DIAGNOSIS — E559 Vitamin D deficiency, unspecified: Secondary | ICD-10-CM | POA: Diagnosis not present

## 2017-11-25 DIAGNOSIS — F331 Major depressive disorder, recurrent, moderate: Secondary | ICD-10-CM | POA: Diagnosis not present

## 2017-11-25 NOTE — Progress Notes (Signed)
Office: [phone]  /  Fax: [phone]   HPI:   Chief Complaint: OBESITY Caroline Gonzales is here to discuss her progress with her obesity treatment plan. She is on the keep a food journal with 1500 calories and 100 grams of protein  and is following her eating plan approximately 20 % of the time. She states she is exercising 0 minutes 0 times per week. Caroline Gonzales is struggling with emotional eating and states she was purposefully sabotaging herself. She has had lots of family stressors recently. Her weight is 260 lb (117.9 kg) today and has had a weight gain of 5 pounds over a period of 2 weeks since her last visit. She has lost 18 lbs since starting treatment with [name].  Pre-Diabetes Caroline Gonzales has a diagnosis of pre-diabetes based on her elevated Hgb A1c and was informed this puts her at greater risk of developing diabetes. She hasn't started taking metformin yet and continues to work on diet and exercise to decrease risk of diabetes. She admits polyphagia and denies nausea or hypoglycemia.  Vitamin D deficiency Caroline Gonzales has a diagnosis of vitamin D deficiency. Caroline Gonzales is currently taking OTC vit D and she denies nausea, vomiting or muscle weakness.   Ref. Range 10/07/2017 12:55  Vitamin D, 25-Hydroxy Latest Ref Range: 30.0 - 100.0 ng/mL 64.8   ALLERGIES: Allergies  Allergen Reactions  . Tamiflu [Oseltamivir Phosphate] Anaphylaxis  . Cephalosporins Rash  . Latex Rash  . Penicillins Rash    MEDICATIONS: Current Outpatient Medications on File Prior to Visit  Medication Sig Dispense Refill  . Cholecalciferol (VITAMIN D3) 5000 units CAPS Take 5,000 Units by mouth at bedtime.    . clonazePAM (KLONOPIN) 1 MG tablet Take 1 mg by mouth 2 (two) times daily as needed for anxiety. Taking 0.5mg  1-2 tabs BID prn    . gabapentin (NEURONTIN) 100 MG capsule Take 100 mg by mouth 2 (two) times daily.    [name] ibuprofen (ADVIL,MOTRIN) 200 MG tablet Take 400-600 mg by mouth daily as needed for headache or moderate  pain.    . L-methylfolate Calcium 15 MG TABS Take 15 m by mouth daily.    [name] lamoTRIgine (LAMICTAL) 200 MG tablet Take 200 mg by mouth at bedtime.    [name] levonorgestrel (MIRENA) 20 MCG/24HR IUD 1 each by Intrauterine route once.    . loratadine (CLARITIN) 10 MG tablet Take 10 mg by mouth at bedtime.     . metFORMIN (GLUCOPHAGE) 500 MG tablet Take 1 tablet (500 mg total) by mouth daily with breakfast. 30 tablet 0  . Multiple Vitamin (MULTIVITAMIN) tablet Take 1 tablet by mouth Nightly.    . pantoprazole (PROTONIX) 40 MG tablet Take 40 mg by mouth daily.    . propranolol (INDERAL) 40 MG tablet Take 40 mg by mouth 2 (two) times daily.     . QUEtiapine (SEROQUEL) 400 MG tablet Take 200 mg at bedtime by mouth.      No current facility-administered medications on file prior to visit.     PAST MEDICAL HISTORY: Past Medical History:  Diagnosis Date  . Allergy    Zyrtec, Allegra.  . Anxiety    followed by Dr. Toy Cookey  . Back pain   . Bipolar 1 disorder ([place])   . Complication of anesthesia   . Depression   . Fibroid   . Gallbladder problem   . Gastroparesis 09/14/2012   gastric emptying study in 2014  . GERD (gastroesophageal reflux disease)   . Pneumonia    2013ish  .  PONV (postoperative nausea and vomiting)   . Pre-diabetes   . PTSD (post-traumatic stress disorder)   . Vitamin D deficiency     PAST SURGICAL HISTORY: Past Surgical History:  Procedure Laterality Date  . CESAREAN SECTION    . CHOLECYSTECTOMY    . DILATION AND CURETTAGE OF UTERUS    . FOREIGN BODY REMOVAL Left 11/18/2016   Procedure: REMOVAL FOREIGN BODY EXTREMITY LEFT FOOT;  Surgeon: Trula Slade, DPM;  Location: [facility];  Service: Podiatry;  Laterality: Left;  . PILONIDAL CYST EXCISION  1990's  . TENDON REPAIR Left    Left Ankle  . WISDOM TOOTH EXTRACTION  19090's    SOCIAL HISTORY: Social History   Tobacco Use  . Smoking status: Former Smoker    Years: 10.00    Types: Cigarettes    Last attempt to quit:  2003    Years since quitting: 16.2  . Smokeless tobacco: Never Used  Substance Use Topics  . Alcohol use: Yes    Alcohol/week: 0.0 oz    Comment: 1-2 per month   . Drug use: No    FAMILY HISTORY: Family History  Problem Relation Age of Onset  . Cancer Mother        squamous cell carcinoma  . Hyperlipidemia Mother   . Depression Mother   . Anxiety disorder Mother   . Obesity Mother   . Heart disease Father        cardiomegaly, CHF  . Hyperlipidemia Father   . Hypertension Father   . Mental retardation Father   . High blood pressure Father   . Depression Father   . Anxiety disorder Father   . Obesity Father   . Cancer Maternal Grandmother   . Diabetes Maternal Grandmother   . Heart disease Maternal Grandmother   . Hyperlipidemia Maternal Grandmother   . Hypertension Maternal Grandmother   . Stroke Maternal Grandmother   . Heart disease Paternal Grandmother   . Hypertension Paternal Grandmother   . Heart disease Paternal Grandfather   . Hyperlipidemia Paternal Grandfather   . Mental illness Paternal Grandfather   . Breast cancer Maternal Aunt   . Thyroid cancer Paternal Aunt     ROS: Review of Systems  Constitutional: Negative for weight loss.  Gastrointestinal: Negative for nausea and vomiting.  Musculoskeletal:       Negative for muscle weakness  Endo/Heme/Allergies:       Positive for polyphagia Negative for hypoglycemia    PHYSICAL EXAM: Blood pressure 110/73, pulse 78, temperature (!) 97.5 F (36.4 C), temperature source Oral, height 5\' 5"  (1.651 m), weight 260 lb (117.9 kg), SpO2 100 %. Body mass index is 43.27 kg/m. Physical Exam  Constitutional: She is oriented to person, place, and time. She appears well-developed and well-nourished.  Cardiovascular: Normal rate.  Pulmonary/Chest: Effort normal.  Musculoskeletal: Normal range of motion.  Neurological: She is oriented to person, place, and time.  Skin: Skin is warm and dry.  Psychiatric: She has  a normal mood and affect. Her behavior is normal.  Vitals reviewed.   RECENT LABS AND TESTS: BMET    Component Value Date/Time   NA 141 10/07/2017 1255   K 4.8 10/07/2017 1255   CL 101 10/07/2017 1255   CO2 24 10/07/2017 1255   GLUCOSE 88 10/07/2017 1255   GLUCOSE 87 06/27/2016 0933   BUN 18 10/07/2017 1255   CREATININE 0.99 10/07/2017 1255   CREATININE 0.95 06/27/2016 0933   CALCIUM 9.4 10/07/2017 1255   GFRNONAA 71  10/07/2017 1255   GFRAA 81 10/07/2017 1255   Lab Results  Component Value Date   HGBA1C 5.5 10/07/2017   HGBA1C 5.1 06/22/2017   HGBA1C 5.3 01/11/2017   HGBA1C 5.7 (H) 08/31/2016   HGBA1C 5.6 12/01/2015   Lab Results  Component Value Date   INSULIN 15.6 10/07/2017   INSULIN 25.2 (H) 06/22/2017   INSULIN 17.0 01/11/2017   INSULIN 17.4 08/31/2016   CBC    Component Value Date/Time   WBC 5.2 06/22/2017 0904   WBC 8.1 06/27/2016 0941   WBC 9.9 04/30/2016 0947   RBC 4.07 06/22/2017 0904   RBC 3.96 (A) 06/27/2016 0941   RBC 4.15 04/30/2016 0947   HGB 11.2 06/22/2017 0904   HCT 34.8 06/22/2017 0904   PLT 332 11/12/2016 1651   MCV 86 06/22/2017 0904   MCH 27.5 06/22/2017 0904   MCH 29.0 06/27/2016 0941   MCH 28.2 04/30/2016 0947   MCHC 32.2 06/22/2017 0904   MCHC 34.5 06/27/2016 0941   MCHC 33.2 04/30/2016 0947   RDW 15.7 (H) 06/22/2017 0904   LYMPHSABS 1.5 06/22/2017 0904   MONOABS 891 04/30/2016 0947   EOSABS 0.2 06/22/2017 0904   BASOSABS 0.0 06/22/2017 0904   Iron/TIBC/Ferritin/ %Sat    Component Value Date/Time   IRON 53 08/31/2016 1644   TIBC 280 08/31/2016 1644   FERRITIN 36 08/31/2016 1644   IRONPCTSAT 19 08/31/2016 1644   Lipid Panel     Component Value Date/Time   CHOL 162 10/07/2017 1255   TRIG 153 (H) 10/07/2017 1255   HDL 42 10/07/2017 1255   CHOLHDL 4.1 12/01/2015 0914   VLDL 37 (H) 12/01/2015 0914   LDLCALC 89 10/07/2017 1255   Hepatic Function Panel     Component Value Date/Time   PROT 7.3 10/07/2017 1255    ALBUMIN 4.0 10/07/2017 1255   AST 18 10/07/2017 1255   ALT 18 10/07/2017 1255   ALKPHOS 87 10/07/2017 1255   BILITOT <0.2 10/07/2017 1255      Component Value Date/Time   TSH 1.620 08/31/2016 1644   TSH 1.83 12/01/2015 0914    Ref. Range 10/07/2017 12:55  Vitamin D, 25-Hydroxy Latest Ref Range: 30.0 - 100.0 ng/mL 64.8   ASSESSMENT AND PLAN: Pre-diabetes  Vitamin D deficiency  Class 3 severe obesity with serious comorbidity and body mass index (BMI) of 40.0 to 44.9 in adult, unspecified obesity type [facility])  PLAN:  Pre-Diabetes Caroline Gonzales will continue to work on weight loss, exercise, and decreasing simple carbohydrates in her diet to help decrease the risk of diabetes. We dicussed metformin including benefits and risks. She was informed that eating too many simple carbohydrates or too many calories at one sitting increases the likelihood of GI side effects. Caroline Gonzales agrees to restart metformin for now and a prescription was not written today. Caroline Gonzales agreed to follow up with [name] as directed to monitor her progress.  Vitamin D Deficiency Caroline Gonzales was informed that low vitamin D levels contributes to fatigue and are associated with obesity, breast, and colon cancer. She agrees to continue to take OTC vitamin D daily. We will recheck labs in 1 month and she will follow up for routine testing of vitamin D, at least 2-3 times per year. She was informed of the risk of over-replacement of vitamin D and agrees to not increase her dose unless she discusses this with [name] first.  We spent > than 50% of the 15 minute visit on the counseling as documented in the note.  Obesity  Caroline Gonzales is currently in the action stage of change. As such, her goal is to continue with weight loss efforts She has agreed to portion control better and make smarter food choices, such as increase vegetables and decrease simple carbohydrates  Caroline Gonzales has been instructed to work up to a goal of 150 minutes of combined cardio and  strengthening exercise per week for weight loss and overall health benefits. We discussed the following Behavioral Modification Strategies today: emotional eating strategies and increase H2O intake  Caroline Gonzales has agreed to follow up with our clinic in 1 week. She was informed of the importance of frequent follow up visits to maximize her success with intensive lifestyle modifications for her multiple health conditions.  I, Doreene Nest, am acting as transcriptionist for Eber Jones, MD  I have reviewed the above documentation for accuracy and completeness, and I agree with the above. - Ilene Qua, MD

## 2017-11-29 DIAGNOSIS — F4322 Adjustment disorder with anxiety: Secondary | ICD-10-CM | POA: Diagnosis not present

## 2017-11-30 ENCOUNTER — Ambulatory Visit (INDEPENDENT_AMBULATORY_CARE_PROVIDER_SITE_OTHER): Payer: 59 | Admitting: Family Medicine

## 2017-11-30 ENCOUNTER — Telehealth: Payer: Self-pay | Admitting: Obstetrics and Gynecology

## 2017-11-30 VITALS — BP 119/76 | HR 82 | Ht 65.0 in | Wt 262.0 lb

## 2017-11-30 DIAGNOSIS — E559 Vitamin D deficiency, unspecified: Secondary | ICD-10-CM

## 2017-11-30 DIAGNOSIS — Z9189 Other specified personal risk factors, not elsewhere classified: Secondary | ICD-10-CM | POA: Diagnosis not present

## 2017-11-30 DIAGNOSIS — Z6841 Body Mass Index (BMI) 40.0 and over, adult: Secondary | ICD-10-CM

## 2017-11-30 DIAGNOSIS — Z30433 Encounter for removal and reinsertion of intrauterine contraceptive device: Secondary | ICD-10-CM

## 2017-11-30 DIAGNOSIS — R7303 Prediabetes: Secondary | ICD-10-CM | POA: Diagnosis not present

## 2017-11-30 MED ORDER — METFORMIN HCL 1000 MG PO TABS: 1000.0000 mg | ORAL_TABLET | Freq: Every day | ORAL | 0 refills | Status: DC

## 2017-11-30 MED FILL — metFORMIN HCL 1000 MG TABS: 1000 | 30 days supply | Qty: 30 | Fill #0

## 2017-11-30 NOTE — Progress Notes (Signed)
Office: [phone]  /  Fax: [phone]   HPI:   Chief Complaint: OBESITY Jolie is here to discuss her progress with her obesity treatment plan. She is on the portion control better and make smarter food choices plan and is following her eating plan approximately 20 % of the time. She states she is doing 8,000 steps 7 times per week. Arriyanna has significant life stressors at home. She is trying to get custody of her step daughter. Aryiana is doing lots of comfort eating. Her weight is 262 lb (118.8 kg) today and has had a weight gain of 2 pounds over a period of 1 week since her last visit. She has lost 16 lbs since starting treatment with [name].  Pre-Diabetes Evgenia has a diagnosis of pre-diabetes based on her elevated Hgb A1c and was informed this puts her at greater risk of developing diabetes. She is taking metformin currently and continues to work on diet and exercise to decrease risk of diabetes. She denies any GI upset or hypoglycemia.  At risk for diabetes Regenia is at higher than average risk for developing diabetes due to her obesity and pre-diabetes. She currently denies polyuria or polydipsia.  Vitamin D deficiency Jazell has a diagnosis of vitamin D deficiency. She is currently taking OTC vit D and denies nausea, vomiting or muscle weakness.  ALLERGIES: Allergies  Allergen Reactions  . Tamiflu [Oseltamivir Phosphate] Anaphylaxis  . Cephalosporins Rash  . Latex Rash  . Penicillins Rash    MEDICATIONS: Current Outpatient Medications on File Prior to Visit  Medication Sig Dispense Refill  . Cholecalciferol (VITAMIN D3) 5000 units CAPS Take 5,000 Units by mouth at bedtime.    . clonazePAM (KLONOPIN) 1 MG tablet Take 1 mg by mouth 2 (two) times daily as needed for anxiety. Taking 0.5mg  1-2 tabs BID prn    . gabapentin (NEURONTIN) 100 MG capsule Take 100 mg by mouth 3 (three) times daily.     [name] ibuprofen (ADVIL,MOTRIN) 200 MG tablet Take 400-600 mg by mouth daily as  needed for headache or moderate pain.    . L-methylfolate Calcium 15 MG TABS Take 15 m by mouth daily.    [name] lamoTRIgine (LAMICTAL) 200 MG tablet Take 200 mg by mouth at bedtime.    [name] levonorgestrel (MIRENA) 20 MCG/24HR IUD 1 each by Intrauterine route once.    . loratadine (CLARITIN) 10 MG tablet Take 10 mg by mouth at bedtime.     . Multiple Vitamin (MULTIVITAMIN) tablet Take 1 tablet by mouth Nightly.    . pantoprazole (PROTONIX) 40 MG tablet Take 40 mg by mouth daily.    . propranolol (INDERAL) 40 MG tablet Take 40 mg by mouth 2 (two) times daily.     . QUEtiapine (SEROQUEL) 400 MG tablet Take 200 mg at bedtime by mouth.      No current facility-administered medications on file prior to visit.     PAST MEDICAL HISTORY: Past Medical History:  Diagnosis Date  . Allergy    Zyrtec, Allegra.  . Anxiety    followed by Dr. Toy Cookey  . Back pain   . Bipolar 1 disorder ([place])   . Complication of anesthesia   . Depression   . Fibroid   . Gallbladder problem   . Gastroparesis 09/14/2012   gastric emptying study in 2014  . GERD (gastroesophageal reflux disease)   . Pneumonia    2013ish  . PONV (postoperative nausea and vomiting)   . Pre-diabetes   . PTSD (post-traumatic stress  disorder)   . Vitamin D deficiency     PAST SURGICAL HISTORY: Past Surgical History:  Procedure Laterality Date  . CESAREAN SECTION    . CHOLECYSTECTOMY    . DILATION AND CURETTAGE OF UTERUS    . FOREIGN BODY REMOVAL Left 11/18/2016   Procedure: REMOVAL FOREIGN BODY EXTREMITY LEFT FOOT;  Surgeon: Trula Slade, DPM;  Location: [facility];  Service: Podiatry;  Laterality: Left;  . PILONIDAL CYST EXCISION  1990's  . TENDON REPAIR Left    Left Ankle  . WISDOM TOOTH EXTRACTION  19090's    SOCIAL HISTORY: Social History   Tobacco Use  . Smoking status: Former Smoker    Years: 10.00    Types: Cigarettes    Last attempt to quit: 2003    Years since quitting: 42  . Smokeless tobacco: Never Used  Substance  Use Topics  . Alcohol use: Yes    Alcohol/week: 0.0 oz    Comment: 1-2 per month   . Drug use: No    FAMILY HISTORY: Family History  Problem Relation Age of Onset  . Cancer Mother        squamous cell carcinoma  . Hyperlipidemia Mother   . Depression Mother   . Anxiety disorder Mother   . Obesity Mother   . Heart disease Father        cardiomegaly, CHF  . Hyperlipidemia Father   . Hypertension Father   . Mental retardation Father   . High blood pressure Father   . Depression Father   . Anxiety disorder Father   . Obesity Father   . Cancer Maternal Grandmother   . Diabetes Maternal Grandmother   . Heart disease Maternal Grandmother   . Hyperlipidemia Maternal Grandmother   . Hypertension Maternal Grandmother   . Stroke Maternal Grandmother   . Heart disease Paternal Grandmother   . Hypertension Paternal Grandmother   . Heart disease Paternal Grandfather   . Hyperlipidemia Paternal Grandfather   . Mental illness Paternal Grandfather   . Breast cancer Maternal Aunt   . Thyroid cancer Paternal Aunt     ROS: Review of Systems  Constitutional: Negative for weight loss.  Gastrointestinal: Negative for diarrhea, nausea and vomiting.  Genitourinary: Negative for frequency.  Musculoskeletal:       Negative for muscle weakness  Endo/Heme/Allergies: Negative for polydipsia.       Negative for hypoglycemia    PHYSICAL EXAM: Blood pressure 119/76, pulse 82, height 5\' 5"  (1.651 m), weight 262 lb (118.8 kg), SpO2 100 %. Body mass index is 43.6 kg/m. Physical Exam  Constitutional: She is oriented to person, place, and time. She appears well-developed and well-nourished.  Cardiovascular: Normal rate.  Pulmonary/Chest: Effort normal.  Musculoskeletal: Normal range of motion.  Neurological: She is oriented to person, place, and time.  Skin: Skin is warm and dry.  Psychiatric: She has a normal mood and affect. Her behavior is normal.  Vitals reviewed.   RECENT LABS AND  TESTS: BMET    Component Value Date/Time   NA 141 10/07/2017 1255   K 4.8 10/07/2017 1255   CL 101 10/07/2017 1255   CO2 24 10/07/2017 1255   GLUCOSE 88 10/07/2017 1255   GLUCOSE 87 06/27/2016 0933   BUN 18 10/07/2017 1255   CREATININE 0.99 10/07/2017 1255   CREATININE 0.95 06/27/2016 0933   CALCIUM 9.4 10/07/2017 1255   GFRNONAA 71 10/07/2017 1255   GFRAA 81 10/07/2017 1255   Lab Results  Component Value Date  HGBA1C 5.5 10/07/2017   HGBA1C 5.1 06/22/2017   HGBA1C 5.3 01/11/2017   HGBA1C 5.7 (H) 08/31/2016   HGBA1C 5.6 12/01/2015   Lab Results  Component Value Date   INSULIN 15.6 10/07/2017   INSULIN 25.2 (H) 06/22/2017   INSULIN 17.0 01/11/2017   INSULIN 17.4 08/31/2016   CBC    Component Value Date/Time   WBC 5.2 06/22/2017 0904   WBC 8.1 06/27/2016 0941   WBC 9.9 04/30/2016 0947   RBC 4.07 06/22/2017 0904   RBC 3.96 (A) 06/27/2016 0941   RBC 4.15 04/30/2016 0947   HGB 11.2 06/22/2017 0904   HCT 34.8 06/22/2017 0904   PLT 332 11/12/2016 1651   MCV 86 06/22/2017 0904   MCH 27.5 06/22/2017 0904   MCH 29.0 06/27/2016 0941   MCH 28.2 04/30/2016 0947   MCHC 32.2 06/22/2017 0904   MCHC 34.5 06/27/2016 0941   MCHC 33.2 04/30/2016 0947   RDW 15.7 (H) 06/22/2017 0904   LYMPHSABS 1.5 06/22/2017 0904   MONOABS 891 04/30/2016 0947   EOSABS 0.2 06/22/2017 0904   BASOSABS 0.0 06/22/2017 0904   Iron/TIBC/Ferritin/ %Sat    Component Value Date/Time   IRON 53 08/31/2016 1644   TIBC 280 08/31/2016 1644   FERRITIN 36 08/31/2016 1644   IRONPCTSAT 19 08/31/2016 1644   Lipid Panel     Component Value Date/Time   CHOL 162 10/07/2017 1255   TRIG 153 (H) 10/07/2017 1255   HDL 42 10/07/2017 1255   CHOLHDL 4.1 12/01/2015 0914   VLDL 37 (H) 12/01/2015 0914   LDLCALC 89 10/07/2017 1255   Hepatic Function Panel     Component Value Date/Time   PROT 7.3 10/07/2017 1255   ALBUMIN 4.0 10/07/2017 1255   AST 18 10/07/2017 1255   ALT 18 10/07/2017 1255   ALKPHOS 87  10/07/2017 1255   BILITOT <0.2 10/07/2017 1255      Component Value Date/Time   TSH 1.620 08/31/2016 1644   TSH 1.83 12/01/2015 0914   Results for JAYLYNE, BREESE (MRN [id number]) as of 11/30/2017 13:49  Ref. Range 10/07/2017 12:55  Vitamin D, 25-Hydroxy Latest Ref Range: 30.0 - 100.0 ng/mL 64.8   ASSESSMENT AND PLAN: Prediabetes - Plan: metFORMIN (GLUCOPHAGE) 1000 MG tablet  Vitamin D deficiency  At risk for diabetes mellitus  Class 3 severe obesity with serious comorbidity and body mass index (BMI) of 40.0 to 44.9 in adult, unspecified obesity type [name])  PLAN:  Pre-Diabetes Trudie will continue to work on weight loss, exercise, and decreasing simple carbohydrates in her diet to help decrease the risk of diabetes. We dicussed metformin including benefits and risks. She was informed that eating too many simple carbohydrates or too many calories at one sitting increases the likelihood of GI side effects. Miyuki agrees to continue metformin 1,000 mg by mouth daily #30 with no refills and follow up with [name] as directed to monitor her progress.  Diabetes risk counseling Naylee was given extended (15 minutes) diabetes prevention counseling today. She is 42 y.o. female and has risk factors for diabetes including obesity and pre-diabetes. We discussed intensive lifestyle modifications today with an emphasis on weight loss as well as increasing exercise and decreasing simple carbohydrates in her diet.  Vitamin D Deficiency Kati was informed that low vitamin D levels contributes to fatigue and are associated with obesity, breast, and colon cancer. She agrees to continue to take OTC Vit D @5 ,000 IU every day and will follow up for routine testing of vitamin D,  at least 2-3 times per year. She was informed of the risk of over-replacement of vitamin D and agrees to not increase her dose unless she discusses this with [name] first.  Obesity Tabatha is currently in the action stage of  change. As such, her goal is to continue with weight loss efforts She has agreed to portion control better and make smarter food choices, such as increase vegetables and decrease simple carbohydrates  Soren has been instructed to work up to a goal of 150 minutes of combined cardio and strengthening exercise per week for weight loss and overall health benefits. We discussed the following Behavioral Modification Strategies today: planning for success, increasing lean protein intake, dealing with family or coworker sabotage and emotional eating strategies  Livi has agreed to follow up with our clinic in 2 weeks. She was informed of the importance of frequent follow up visits to maximize her success with intensive lifestyle modifications for her multiple health conditions.   I, Doreene Nest, am acting as transcriptionist for Eber Jones, MD I have reviewed the above documentation for accuracy and completeness, and I agree with the above. - Ilene Qua, MD

## 2017-11-30 NOTE — Telephone Encounter (Addendum)
Patient has a Mirena IUD in place. IUD was placed 7/14 due for exchange by 7/19. Asking for this to be done at [facility] on 03/24/2018 with Dr.Jertson.   Dr.Jerston, okay to perform IUD removal and reinsertion at [facility]?

## 2017-11-30 NOTE — Telephone Encounter (Signed)
Sure, can you please get it pre-certified.

## 2017-11-30 NOTE — Telephone Encounter (Signed)
Patient would like IUD changed out at her annual exam scheduled on 03/24/18.

## 2017-11-30 NOTE — Telephone Encounter (Signed)
Orders placed.  Left message to call Thomas at [phone].

## 2017-12-02 NOTE — Telephone Encounter (Signed)
Spoke with patient. Advised reviewed with Dr.Jertson and IUD exchange can be done at her [facility]. Patient verbalizes understanding. Encounter closed.

## 2017-12-06 MED FILL — PANTOPRAZOLE SOD DR 40 MG T: 40 | 30 days supply | Qty: 30 | Fill #0

## 2017-12-07 ENCOUNTER — Ambulatory Visit: Payer: Self-pay | Admitting: *Deleted

## 2017-12-07 ENCOUNTER — Ambulatory Visit (HOSPITAL_COMMUNITY)
Admission: EM | Admit: 2017-12-07 | Discharge: 2017-12-07 | Disposition: A | Discharge status: Home / Self Care | Payer: 59 | Attending: Family Medicine | Admitting: Family Medicine

## 2017-12-07 ENCOUNTER — Encounter (HOSPITAL_COMMUNITY): Payer: Self-pay | Admitting: Family Medicine

## 2017-12-07 ENCOUNTER — Ambulatory Visit: Payer: Self-pay | Admitting: Physician Assistant

## 2017-12-07 DIAGNOSIS — R197 Diarrhea, unspecified: Secondary | ICD-10-CM | POA: Diagnosis not present

## 2017-12-07 DIAGNOSIS — R112 Nausea with vomiting, unspecified: Secondary | ICD-10-CM | POA: Diagnosis not present

## 2017-12-07 MED ORDER — ONDANSETRON 4 MG PO TBDP
4.0000 mg | ORAL_TABLET | Freq: Once | ORAL | Status: AC
  Administered 2017-12-07: 4 mg via ORAL

## 2017-12-07 MED ORDER — ONDANSETRON 4 MG PO TBDP: 4.0000 mg | ORAL_TABLET | Freq: Three times a day (TID) | ORAL | 0 refills | Status: DC | PRN

## 2017-12-07 MED ORDER — ONDANSETRON 4 MG PO TBDP
ORAL_TABLET | ORAL | Status: AC
  Filled 2017-12-07: qty 1

## 2017-12-07 MED FILL — ONDANSETRON ODT 4 MG TABLET: 4 | 7 days supply | Qty: 20 | Fill #0

## 2017-12-07 NOTE — Discharge Instructions (Signed)
Zofran for nausea and vomiting as needed. Keep hydrated, you urine should be clear to pale yellow in color. Bland diet as attached, advance as tolerated. Monitor for any worsening of symptoms, nausea or vomiting not controlled by medication, worsening abdominal pain, fever, follow-up for reevaluation.

## 2017-12-07 NOTE — Telephone Encounter (Signed)
Pt states that she started vomiting and diarrhea around 12 last night. She would like to talk to a nurse to know if she should give it another 12 hours to see how she feels or if she needs to come in to be seen.

## 2017-12-07 NOTE — Telephone Encounter (Signed)
Patient is calling to report nausea and vomiting starting last night. She is concerned because she is on medications that cause withdrawal symptoms if she misses them. Patient is afraid the the withdrawal symptoms will make her vomiting worse. Reason for Disposition . Vomiting a prescription medication  Answer Assessment - Initial Assessment Questions 1. VOMITING SEVERITY: "How many times have you vomited in the past 24 hours?"     - MILD:  1 - 2 times/day    - MODERATE: 3 - 5 times/day, decreased oral intake without significant weight loss or symptoms of dehydration    - SEVERE: 6 or more times/day, vomits everything or nearly everything, with significant weight loss, symptoms of dehydration      3-4 - moderate 2. ONSET: "When did the vomiting begin?"      12 hours ago 3. FLUIDS: "What fluids or food have you vomited up today?" "Have you been able to keep any fluids down?"     Sips today- did not stay down 4. ABDOMINAL PAIN: "Are your having any abdominal pain?" If yes : "How bad is it and what does it feel like?" (e.g., crampy, dull, intermittent, constant)      Yes- upper abdomen- associated with vomiting 5. DIARRHEA: "Is there any diarrhea?" If so, ask: "How many times today?"      Yes- 8 episodes since midnight 6. CONTACTS: "Is there anyone else in the family with the same symptoms?"      No- patient works at [employer]. CAUSE: "What do you think is causing your vomiting?"     Stomach virus 8. HYDRATION STATUS: "Any signs of dehydration?" (e.g., dry mouth [not only dry lips], too weak to stand) "When did you last urinate?"     This morning 9. OTHER SYMPTOMS: "Do you have any other symptoms?" (e.g., fever, headache, vertigo, vomiting blood or coffee grounds, recent head injury)     headache 10. PREGNANCY: "Is there any chance you are pregnant?" "When was your last menstrual period?"       No- IUD  Protocols used: [facility]

## 2017-12-07 NOTE — ED Provider Notes (Signed)
[facility]    CSN: [id number] Arrival date & time: 12/07/17  1319     History   Chief Complaint Chief Complaint  Patient presents with  . Nausea  . Vomiting  . Diarrhea    HPI Caroline Gonzales is a 42 y.o. female.   42 year old female comes in for 1 day history of nausea, vomiting, diarrhea. States starting about midnight, she has had 6-8 episodes of nonbilious nonbloody vomit and about 1 watery diarrhea every 45 minutes.  Denies blood in stool.  Some abdominal pain after vomiting, otherwise without abdominal pain.  Denies fever, chills, night sweats.  Denies URI symptoms such as rhinorrhea, nasal congestion, sore throat.  Positive sick contact.  Denies recent antibiotic use.  States has not been able to keep food or water down.  Worries about her medication as she was not able to keep it down this morning. States worries that she cannot keep her Klonopin down at night.      Past Medical History:  Diagnosis Date  . Allergy    Zyrtec, Allegra.  . Anxiety    followed by Dr. Toy Cookey  . Back pain   . Bipolar 1 disorder ([place])   . Complication of anesthesia   . Depression   . Fibroid   . Gallbladder problem   . Gastroparesis 09/14/2012   gastric emptying study in 2014  . GERD (gastroesophageal reflux disease)   . Pneumonia    2013ish  . PONV (postoperative nausea and vomiting)   . Pre-diabetes   . PTSD (post-traumatic stress disorder)   . Vitamin D deficiency     Patient Active Problem List   Diagnosis Date Noted  . Insulin resistance 10/21/2017  . Other hyperlipidemia 10/07/2017  . Vitamin D deficiency 10/07/2017  . Bipolar depression ([place]) 04/27/2017  . Class 3 obesity with serious comorbidity and body mass index (BMI) of 40.0 to 44.9 in adult 04/27/2017  . Residual foreign body in soft tissue 11/12/2016  . Prediabetes 11/02/2016  . Morbid obesity ([place]) 10/15/2016  . Bipolar 1 disorder [facility])     Past Surgical History:  Procedure Laterality Date  .  CESAREAN SECTION    . CHOLECYSTECTOMY    . DILATION AND CURETTAGE OF UTERUS    . FOREIGN BODY REMOVAL Left 11/18/2016   Procedure: REMOVAL FOREIGN BODY EXTREMITY LEFT FOOT;  Surgeon: Trula Slade, DPM;  Location: [facility];  Service: Podiatry;  Laterality: Left;  . PILONIDAL CYST EXCISION  1990's  . TENDON REPAIR Left    Left Ankle  . WISDOM TOOTH EXTRACTION  19090's    OB History    Gravida  3   Para  3   Term  3   Preterm      AB      Living  3     SAB      TAB      Ectopic      Multiple      Live Births  3            Home Medications    Prior to Admission medications   Medication Sig Start Date End Date Taking? Authorizing Provider  Cholecalciferol (VITAMIN D3) 5000 units CAPS Take 5,000 Units by mouth at bedtime.    [provider]  clonazePAM (KLONOPIN) 1 MG tablet Take 1 mg by mouth 2 (two) times daily as needed for anxiety. Taking 0.5mg  1-2 tabs BID prn    [provider]  gabapentin (NEURONTIN)  100 MG capsule Take 100 mg by mouth 3 (three) times daily.     [provider]  ibuprofen (ADVIL,MOTRIN) 200 MG tablet Take 400-600 mg by mouth daily as needed for headache or moderate pain.    [provider]  L-methylfolate Calcium 15 MG TABS Take 15 m by mouth daily.    [provider]  lamoTRIgine (LAMICTAL) 200 MG tablet Take 200 mg by mouth at bedtime.    [provider]  levonorgestrel (MIRENA) 20 MCG/24HR IUD 1 each by Intrauterine route once.    [provider]  loratadine (CLARITIN) 10 MG tablet Take 10 mg by mouth at bedtime.     [provider]  metFORMIN (GLUCOPHAGE) 1000 MG tablet Take 1 tablet (1,000 mg total) by mouth daily with breakfast. 11/30/17   Eber Jones, MD  Multiple Vitamin (MULTIVITAMIN) tablet Take 1 tablet by mouth Nightly.    [provider]  ondansetron (ZOFRAN ODT) 4 MG disintegrating tablet Take 1 tablet (4 mg total) by mouth every 8 (eight)  hours as needed for nausea or vomiting. 12/07/17   Tasia Catchings, Amy V, PA-C  pantoprazole (PROTONIX) 40 MG tablet Take 40 mg by mouth daily.    [provider]  propranolol (INDERAL) 40 MG tablet Take 40 mg by mouth 2 (two) times daily.     [provider]  QUEtiapine (SEROQUEL) 400 MG tablet Take 200 mg at bedtime by mouth.     [provider]    Family History Family History  Problem Relation Age of Onset  . Cancer Mother        squamous cell carcinoma  . Hyperlipidemia Mother   . Depression Mother   . Anxiety disorder Mother   . Obesity Mother   . Heart disease Father        cardiomegaly, CHF  . Hyperlipidemia Father   . Hypertension Father   . Mental retardation Father   . High blood pressure Father   . Depression Father   . Anxiety disorder Father   . Obesity Father   . Cancer Maternal Grandmother   . Diabetes Maternal Grandmother   . Heart disease Maternal Grandmother   . Hyperlipidemia Maternal Grandmother   . Hypertension Maternal Grandmother   . Stroke Maternal Grandmother   . Heart disease Paternal Grandmother   . Hypertension Paternal Grandmother   . Heart disease Paternal Grandfather   . Hyperlipidemia Paternal Grandfather   . Mental illness Paternal Grandfather   . Breast cancer Maternal Aunt   . Thyroid cancer Paternal Aunt     Social History Social History   Tobacco Use  . Smoking status: Former Smoker    Years: 10.00    Types: Cigarettes    Last attempt to quit: 2003    Years since quitting: 16.2  . Smokeless tobacco: Never Used  Substance Use Topics  . Alcohol use: Yes    Alcohol/week: 0.0 oz    Comment: 1-2 per month   . Drug use: No     Allergies   Tamiflu [oseltamivir phosphate]; Cephalosporins; Latex; and Penicillins   Review of Systems Review of Systems  Reason unable to perform ROS: See HPI as above.     Physical Exam Triage Vital Signs ED Triage Vitals  Enc Vitals Group     BP 12/07/17 1334 126/85      Pulse Rate 12/07/17 1334 (!) 102     Resp 12/07/17 1334 18     Temp 12/07/17 1334 99.6 F (37.6  C)     Temp src --      SpO2 12/07/17 1334 98 %     Weight --      Height --      Head Circumference --      Peak Flow --      Pain Score 12/07/17 1332 6     Pain Loc --      Pain Edu? --      Excl. in [place]? --    No data found.  Updated Vital Signs BP 126/85   Pulse (!) 102   Temp 99.6 F (37.6 C)   Resp 18   SpO2 98%   Physical Exam  Constitutional: She is oriented to person, place, and time. She appears well-developed and well-nourished. No distress.  Eyes: Pupils are equal, round, and reactive to light. Conjunctivae are normal.  Cardiovascular: Normal rate, regular rhythm and normal heart sounds. Exam reveals no gallop and no friction rub.  No murmur heard. Pulmonary/Chest: Effort normal and breath sounds normal. She has no wheezes. She has no rales.  Abdominal: Soft. Bowel sounds are normal. She exhibits no distension. There is no tenderness. There is no rebound, no guarding and no CVA tenderness.  Neurological: She is alert and oriented to person, place, and time.  Skin: Skin is warm and dry.     UC Treatments / Results  Labs (all labs ordered are listed, but only abnormal results are displayed) Labs Reviewed - No data to display  EKG None Radiology No results found.  Procedures Procedures (including critical care time)  Medications Ordered in UC Medications  ondansetron (ZOFRAN-ODT) disintegrating tablet 4 mg (4 mg Oral Given 12/07/17 1431)     Initial Impression / Assessment and Plan / UC Course  I have reviewed the triage vital signs and the nursing notes.  Pertinent labs & imaging results that were available during my care of the patient were reviewed by me and considered in my medical decision making (see chart for details).    Discussed with patient no alarming signs on exam. Zofran for nausea. Push fluids. Bland diet, advance as tolerated. Return  precautions given.   Final Clinical Impressions(s) / UC Diagnoses   Final diagnoses:  Nausea vomiting and diarrhea    ED Discharge Orders        Ordered    ondansetron (ZOFRAN ODT) 4 MG disintegrating tablet  Every 8 hours PRN     12/07/17 1425        Ok Edwards, [place] 12/07/17 1433

## 2017-12-07 NOTE — ED Triage Notes (Signed)
Pt here for N,V,D since yesterday. sts that one of her co workers was sick.

## 2017-12-09 ENCOUNTER — Ambulatory Visit (INDEPENDENT_AMBULATORY_CARE_PROVIDER_SITE_OTHER): Payer: 59 | Admitting: Family Medicine

## 2017-12-15 ENCOUNTER — Ambulatory Visit (INDEPENDENT_AMBULATORY_CARE_PROVIDER_SITE_OTHER): Payer: 59 | Admitting: Family Medicine

## 2017-12-15 VITALS — BP 111/70 | HR 74 | Temp 97.8°F | Ht 65.0 in | Wt 262.0 lb

## 2017-12-15 DIAGNOSIS — Z6841 Body Mass Index (BMI) 40.0 and over, adult: Secondary | ICD-10-CM

## 2017-12-15 DIAGNOSIS — R7303 Prediabetes: Secondary | ICD-10-CM | POA: Diagnosis not present

## 2017-12-15 NOTE — Progress Notes (Signed)
Office: [phone]  /  Fax: [phone]   HPI:   Chief Complaint: OBESITY Caroline Gonzales is here to discuss her progress with her obesity treatment plan. She is on the portion control better and make smarter food choices, such as increase vegetables and decrease simple carbohydrates  and is following her eating plan approximately 30 % of the time. She states she is walking 8,000 steps 5 days a week. Caroline Gonzales was sick with Norovirus last week, limited PO intake last week. Planning on going to [place] next week with her son on his class trip.  Her weight is 262 lb (118.8 kg) today and has not lost weight since her last visit. She has lost 16 lbs since starting treatment with [name].  Pre-Diabetes Caroline Gonzales has a diagnosis of pre-diabetes based on her elevated Hgb A1c and was informed this puts her at greater risk of developing diabetes. She is taking metformin currently and continues to work on diet and exercise to decrease risk of diabetes. She is tolerating some carbohydrates in setting of recent GI issues. She denies hyperphagia, nausea or hypoglycemia.  ALLERGIES: Allergies  Allergen Reactions  . Tamiflu [Oseltamivir Phosphate] Anaphylaxis  . Cephalosporins Rash  . Latex Rash  . Penicillins Rash    MEDICATIONS: Current Outpatient Medications on File Prior to Visit  Medication Sig Dispense Refill  . Cholecalciferol (VITAMIN D3) 5000 units CAPS Take 5,000 Units by mouth at bedtime.    . clonazePAM (KLONOPIN) 1 MG tablet Take 1 mg by mouth 2 (two) times daily as needed for anxiety. Taking 0.5mg  1-2 tabs BID prn    . gabapentin (NEURONTIN) 100 MG capsule Take 100 mg by mouth 3 (three) times daily.     [name] ibuprofen (ADVIL,MOTRIN) 200 MG tablet Take 400-600 mg by mouth daily as needed for headache or moderate pain.    . L-methylfolate Calcium 15 MG TABS Take 15 m by mouth daily.    [name] lamoTRIgine (LAMICTAL) 200 MG tablet Take 200 mg by mouth at bedtime.    [name] levonorgestrel (MIRENA) 20 MCG/24HR IUD 1  each by Intrauterine route once.    . loratadine (CLARITIN) 10 MG tablet Take 10 mg by mouth at bedtime.     . metFORMIN (GLUCOPHAGE) 1000 MG tablet Take 1 tablet (1,000 mg total) by mouth daily with breakfast. 30 tablet 0  . Multiple Vitamin (MULTIVITAMIN) tablet Take 1 tablet by mouth Nightly.    . pantoprazole (PROTONIX) 40 MG tablet Take 40 mg by mouth daily.    . propranolol (INDERAL) 40 MG tablet Take 40 mg by mouth 2 (two) times daily.     . QUEtiapine (SEROQUEL) 400 MG tablet Take 200 mg at bedtime by mouth.      No current facility-administered medications on file prior to visit.     PAST MEDICAL HISTORY: Past Medical History:  Diagnosis Date  . Allergy    Zyrtec, Allegra.  . Anxiety    followed by Dr. Toy Cookey  . Back pain   . Bipolar 1 disorder ([place])   . Complication of anesthesia   . Depression   . Fibroid   . Gallbladder problem   . Gastroparesis 09/14/2012   gastric emptying study in 2014  . GERD (gastroesophageal reflux disease)   . Pneumonia    2013ish  . PONV (postoperative nausea and vomiting)   . Pre-diabetes   . PTSD (post-traumatic stress disorder)   . Vitamin D deficiency     PAST SURGICAL HISTORY: Past Surgical History:  Procedure Laterality Date  .  CESAREAN SECTION    . CHOLECYSTECTOMY    . DILATION AND CURETTAGE OF UTERUS    . FOREIGN BODY REMOVAL Left 11/18/2016   Procedure: REMOVAL FOREIGN BODY EXTREMITY LEFT FOOT;  Surgeon: Trula Slade, DPM;  Location: [facility];  Service: Podiatry;  Laterality: Left;  . PILONIDAL CYST EXCISION  1990's  . TENDON REPAIR Left    Left Ankle  . WISDOM TOOTH EXTRACTION  19090's    SOCIAL HISTORY: Social History   Tobacco Use  . Smoking status: Former Smoker    Years: 10.00    Types: Cigarettes    Last attempt to quit: 2003    Years since quitting: 16.2  . Smokeless tobacco: Never Used  Substance Use Topics  . Alcohol use: Yes    Alcohol/week: 0.0 oz    Comment: 1-2 per month   . Drug use: No     FAMILY HISTORY: Family History  Problem Relation Age of Onset  . Cancer Mother        squamous cell carcinoma  . Hyperlipidemia Mother   . Depression Mother   . Anxiety disorder Mother   . Obesity Mother   . Heart disease Father        cardiomegaly, CHF  . Hyperlipidemia Father   . Hypertension Father   . Mental retardation Father   . High blood pressure Father   . Depression Father   . Anxiety disorder Father   . Obesity Father   . Cancer Maternal Grandmother   . Diabetes Maternal Grandmother   . Heart disease Maternal Grandmother   . Hyperlipidemia Maternal Grandmother   . Hypertension Maternal Grandmother   . Stroke Maternal Grandmother   . Heart disease Paternal Grandmother   . Hypertension Paternal Grandmother   . Heart disease Paternal Grandfather   . Hyperlipidemia Paternal Grandfather   . Mental illness Paternal Grandfather   . Breast cancer Maternal Aunt   . Thyroid cancer Paternal Aunt     ROS: Review of Systems  Constitutional: Negative for weight loss.  Gastrointestinal: Negative for nausea.  Endo/Heme/Allergies:       Negative hyperphagia Negative hypoglycemia    PHYSICAL EXAM: Blood pressure 111/70, pulse 74, temperature 97.8 F (36.6 C), temperature source Oral, height 5\' 5"  (1.651 m), weight 262 lb (118.8 kg), SpO2 100 %. Body mass index is 43.6 kg/m. Physical Exam  Constitutional: She is oriented to person, place, and time. She appears well-developed and well-nourished.  Cardiovascular: Normal rate.  Pulmonary/Chest: Effort normal.  Musculoskeletal: Normal range of motion.  Neurological: She is oriented to person, place, and time.  Skin: Skin is warm and dry.  Psychiatric: She has a normal mood and affect. Her behavior is normal.  Vitals reviewed.   RECENT LABS AND TESTS: BMET    Component Value Date/Time   NA 141 10/07/2017 1255   K 4.8 10/07/2017 1255   CL 101 10/07/2017 1255   CO2 24 10/07/2017 1255   GLUCOSE 88 10/07/2017  1255   GLUCOSE 87 06/27/2016 0933   BUN 18 10/07/2017 1255   CREATININE 0.99 10/07/2017 1255   CREATININE 0.95 06/27/2016 0933   CALCIUM 9.4 10/07/2017 1255   GFRNONAA 71 10/07/2017 1255   GFRAA 81 10/07/2017 1255   Lab Results  Component Value Date   HGBA1C 5.5 10/07/2017   HGBA1C 5.1 06/22/2017   HGBA1C 5.3 01/11/2017   HGBA1C 5.7 (H) 08/31/2016   HGBA1C 5.6 12/01/2015   Lab Results  Component Value Date   INSULIN 15.6 10/07/2017  INSULIN 25.2 (H) 06/22/2017   INSULIN 17.0 01/11/2017   INSULIN 17.4 08/31/2016   CBC    Component Value Date/Time   WBC 5.2 06/22/2017 0904   WBC 8.1 06/27/2016 0941   WBC 9.9 04/30/2016 0947   RBC 4.07 06/22/2017 0904   RBC 3.96 (A) 06/27/2016 0941   RBC 4.15 04/30/2016 0947   HGB 11.2 06/22/2017 0904   HCT 34.8 06/22/2017 0904   PLT 332 11/12/2016 1651   MCV 86 06/22/2017 0904   MCH 27.5 06/22/2017 0904   MCH 29.0 06/27/2016 0941   MCH 28.2 04/30/2016 0947   MCHC 32.2 06/22/2017 0904   MCHC 34.5 06/27/2016 0941   MCHC 33.2 04/30/2016 0947   RDW 15.7 (H) 06/22/2017 0904   LYMPHSABS 1.5 06/22/2017 0904   MONOABS 891 04/30/2016 0947   EOSABS 0.2 06/22/2017 0904   BASOSABS 0.0 06/22/2017 0904   Iron/TIBC/Ferritin/ %Sat    Component Value Date/Time   IRON 53 08/31/2016 1644   TIBC 280 08/31/2016 1644   FERRITIN 36 08/31/2016 1644   IRONPCTSAT 19 08/31/2016 1644   Lipid Panel     Component Value Date/Time   CHOL 162 10/07/2017 1255   TRIG 153 (H) 10/07/2017 1255   HDL 42 10/07/2017 1255   CHOLHDL 4.1 12/01/2015 0914   VLDL 37 (H) 12/01/2015 0914   LDLCALC 89 10/07/2017 1255   Hepatic Function Panel     Component Value Date/Time   PROT 7.3 10/07/2017 1255   ALBUMIN 4.0 10/07/2017 1255   AST 18 10/07/2017 1255   ALT 18 10/07/2017 1255   ALKPHOS 87 10/07/2017 1255   BILITOT <0.2 10/07/2017 1255      Component Value Date/Time   TSH 1.620 08/31/2016 1644   TSH 1.83 12/01/2015 0914    ASSESSMENT AND  PLAN: Prediabetes  Class 3 severe obesity with serious comorbidity and body mass index (BMI) of 40.0 to 44.9 in adult, unspecified obesity type ([place])  PLAN:  Pre-Diabetes Caroline Gonzales will continue to work on weight loss, exercise, and decreasing simple carbohydrates in her diet to help decrease the risk of diabetes. We dicussed metformin including benefits and risks. She was informed that eating too many simple carbohydrates or too many calories at one sitting increases the likelihood of GI side effects. Caroline Gonzales agrees to continue taking metformin 1,000 mg PO daily with breakfast. Caroline Gonzales agrees to follow up with our clinic in 2 weeks as directed to monitor her progress.  We spent > than 50% of the 15 minute visit on the counseling as documented in the note.  Obesity Caroline Gonzales is currently in the action stage of change. As such, her goal is to continue with weight loss efforts She has agreed to portion control better and make smarter food choices, such as increase vegetables and decrease simple carbohydrates  Caroline Gonzales has been instructed to work up to a goal of 150 minutes of combined cardio and strengthening exercise per week for weight loss and overall health benefits. We discussed the following Behavioral Modification Strategies today: increasing lean protein intake, increase H20 intake, and better snacking choices   Caroline Gonzales has agreed to follow up with our clinic in 2 weeks. She was informed of the importance of frequent follow up visits to maximize her success with intensive lifestyle modifications for her multiple health conditions.   I, Trixie Dredge, am acting as transcriptionist for Ilene Qua, MD  I have reviewed the above documentation for accuracy and completeness, and I agree with the above. - Ilene Qua, MD

## 2017-12-17 MED FILL — clonazePAM 0.5 MG TABS: 0.5 | 30 days supply | Qty: 120 | Fill #1

## 2017-12-27 DIAGNOSIS — F4322 Adjustment disorder with anxiety: Secondary | ICD-10-CM | POA: Diagnosis not present

## 2018-01-03 MED FILL — MELOXICAM 7.5 MG TABLET: 7.5 | 30 days supply | Qty: 60 | Fill #1

## 2018-01-03 MED FILL — PANTOPRAZOLE SOD DR 40 MG T: 40 | 30 days supply | Qty: 30 | Fill #1

## 2018-01-03 MED FILL — lamoTRIgine 200 MG TABS: 200 | 90 days supply | Qty: 90 | Fill #0

## 2018-01-03 MED FILL — GABAPENTIN 100 MG CAP: 100 | 30 days supply | Qty: 270 | Fill #1

## 2018-01-04 DIAGNOSIS — M5136 Other intervertebral disc degeneration, lumbar region: Secondary | ICD-10-CM | POA: Diagnosis not present

## 2018-01-04 DIAGNOSIS — M792 Neuralgia and neuritis, unspecified: Secondary | ICD-10-CM | POA: Diagnosis not present

## 2018-01-05 ENCOUNTER — Emergency Department (HOSPITAL_COMMUNITY)
Admission: EM | Admit: 2018-01-05 | Discharge: 2018-01-05 | Disposition: A | Discharge status: Home / Self Care | Payer: 59 | Attending: Emergency Medicine | Admitting: Emergency Medicine

## 2018-01-05 ENCOUNTER — Other Ambulatory Visit: Payer: Self-pay

## 2018-01-05 ENCOUNTER — Encounter (HOSPITAL_COMMUNITY): Payer: Self-pay | Admitting: Emergency Medicine

## 2018-01-05 ENCOUNTER — Ambulatory Visit (INDEPENDENT_AMBULATORY_CARE_PROVIDER_SITE_OTHER): Payer: 59 | Admitting: Family Medicine

## 2018-01-05 ENCOUNTER — Emergency Department (HOSPITAL_COMMUNITY): Payer: 59

## 2018-01-05 VITALS — BP 120/78 | HR 77 | Temp 98.1°F | Ht 65.0 in | Wt 266.0 lb

## 2018-01-05 DIAGNOSIS — K3184 Gastroparesis: Secondary | ICD-10-CM | POA: Insufficient documentation

## 2018-01-05 DIAGNOSIS — R7303 Prediabetes: Secondary | ICD-10-CM | POA: Insufficient documentation

## 2018-01-05 DIAGNOSIS — R0789 Other chest pain: Secondary | ICD-10-CM

## 2018-01-05 DIAGNOSIS — F319 Bipolar disorder, unspecified: Secondary | ICD-10-CM | POA: Diagnosis not present

## 2018-01-05 DIAGNOSIS — Z6841 Body Mass Index (BMI) 40.0 and over, adult: Secondary | ICD-10-CM

## 2018-01-05 DIAGNOSIS — Z79899 Other long term (current) drug therapy: Secondary | ICD-10-CM | POA: Diagnosis not present

## 2018-01-05 DIAGNOSIS — R42 Dizziness and giddiness: Secondary | ICD-10-CM

## 2018-01-05 DIAGNOSIS — Z87891 Personal history of nicotine dependence: Secondary | ICD-10-CM | POA: Diagnosis not present

## 2018-01-05 DIAGNOSIS — Z7984 Long term (current) use of oral hypoglycemic drugs: Secondary | ICD-10-CM | POA: Diagnosis not present

## 2018-01-05 DIAGNOSIS — R079 Chest pain, unspecified: Secondary | ICD-10-CM | POA: Diagnosis not present

## 2018-01-05 LAB — CBC
HCT: 35.4 % — ABNORMAL LOW (ref 36.0–46.0)
Hemoglobin: 11.1 g/dL — ABNORMAL LOW (ref 12.0–15.0)
MCH: 27.6 pg (ref 26.0–34.0)
MCHC: 31.4 g/dL (ref 30.0–36.0)
MCV: 88.1 fL (ref 78.0–100.0)
Platelets: 285 10*3/uL (ref 150–400)
RBC: 4.02 MIL/uL (ref 3.87–5.11)
RDW: 14.4 % (ref 11.5–15.5)
WBC: 5.5 10*3/uL (ref 4.0–10.5)

## 2018-01-05 LAB — COMPREHENSIVE METABOLIC PANEL
ALT: 18 U/L (ref 14–54)
AST: 20 U/L (ref 15–41)
Albumin: 3.5 g/dL (ref 3.5–5.0)
Alkaline Phosphatase: 75 U/L (ref 38–126)
Anion gap: 8 (ref 5–15)
BUN: 17 mg/dL (ref 6–20)
CO2: 23 mmol/L (ref 22–32)
Calcium: 8.9 mg/dL (ref 8.9–10.3)
Chloride: 107 mmol/L (ref 101–111)
Creatinine, Ser: 0.93 mg/dL (ref 0.44–1.00)
GFR calc Af Amer: >60 mL/min (ref >60)
GFR calc non Af Amer: >60 mL/min (ref >60)
Glucose, Bld: 97 mg/dL (ref 65–99)
Potassium: 4.2 mmol/L (ref 3.5–5.1)
Sodium: 138 mmol/L (ref 135–145)
Total Bilirubin: 0.5 mg/dL (ref 0.3–1.2)
Total Protein: 7.9 g/dL (ref 6.5–8.1)

## 2018-01-05 LAB — I-STAT TROPONIN, ED
Troponin i, poc: 0 ng/mL (ref 0.00–0.08)
Troponin i, poc: 0 ng/mL (ref 0.00–0.08)
Troponin i, poc: 0 ng/mL (ref 0.00–0.08)

## 2018-01-05 LAB — BRAIN NATRIURETIC PEPTIDE: B Natriuretic Peptide: 48.7 pg/mL (ref 0.0–100.0)

## 2018-01-05 LAB — I-STAT BETA HCG BLOOD, ED (MC, WL, AP ONLY): I-stat hCG, quantitative: 6.4 m[IU]/mL — ABNORMAL HIGH (ref <5)

## 2018-01-05 MED ORDER — GI COCKTAIL ~~LOC~~
30.0000 mL | Freq: Once | ORAL | Status: AC
  Administered 2018-01-05: 30 mL via ORAL
  Filled 2018-01-05: qty 30

## 2018-01-05 MED ORDER — MECLIZINE HCL 25 MG PO TABS
25.0000 mg | ORAL_TABLET | Freq: Once | ORAL | Status: AC
  Administered 2018-01-05: 25 mg via ORAL
  Filled 2018-01-05: qty 1

## 2018-01-05 NOTE — Discharge Instructions (Signed)
Stop meloxicam and avoid spicy foods or fatty foods.  Follow up with your doctor.  If the pain continues you may want to see a cardiologist for stress testing.  Return if the pain starts radiating you develop nausea or vomiting or have shortness of breath.

## 2018-01-05 NOTE — ED Notes (Signed)
Patient transported to x-ray. ?

## 2018-01-05 NOTE — ED Provider Notes (Signed)
[facility] DEPT Provider Note   CSN: [id number] Arrival date & time: 01/05/18  1696     History   Chief Complaint Chief Complaint  Patient presents with  . Chest Pain    HPI Caroline Gonzales is a 42 y.o. female.  Patient is a 42 year old female with obesity, anxiety, bipolar disease, gastroparesis, insulin resistance who is presenting today with chest pain that started at 5 AM this morning and woke her from sleep.  She describes it as a 2-3/10 type of pain in the central to left side of her chest that is squeezing and tight.  It does not radiate and it is slightly worse with deep breath and sitting forward.  She denies any shortness of breath at baseline.  She has not had any exertional dyspnea or orthopnea.  She denies cough, fever or recent illness.  She has noted over the last 2 weeks she has had intermittent swelling of her hands and feet.  Seems to be the worst at the end of the day but is been intermittent.  She denies any calf tenderness or swelling.  She has had no recent travel or immobilization.  She does have an IUD but does not use any other hormonal supplements and has no prior history of PE and no family history of the same.  She did have one episode of this chest discomfort last week that lasted about 2 hours and resolved spontaneously.  It does not seem to be related to food.  She last ate yesterday at 4 PM and went to bed around 10.  She denies any abdominal pain, nausea or vomiting.  She has no known heart problems and no family history of heart issues.  She does have a history of panic attacks but states this feels nothing like that.  She has not started any new medications in the last month but did start Mobic 2 months ago which she takes before she goes to bed.  The history is provided by the patient.    Past Medical History:  Diagnosis Date  . Allergy    Zyrtec, Allegra.  . Anxiety    followed by Dr. Toy Cookey  . Back pain   . Bipolar  1 disorder ([place])   . Complication of anesthesia   . Depression   . Fibroid   . Gallbladder problem   . Gastroparesis 09/14/2012   gastric emptying study in 2014  . GERD (gastroesophageal reflux disease)   . Pneumonia    2013ish  . PONV (postoperative nausea and vomiting)   . Pre-diabetes   . PTSD (post-traumatic stress disorder)   . Vitamin D deficiency     Patient Active Problem List   Diagnosis Date Noted  . Insulin resistance 10/21/2017  . Other hyperlipidemia 10/07/2017  . Vitamin D deficiency 10/07/2017  . Bipolar depression ([place]) 04/27/2017  . Class 3 obesity with serious comorbidity and body mass index (BMI) of 40.0 to 44.9 in adult 04/27/2017  . Residual foreign body in soft tissue 11/12/2016  . Prediabetes 11/02/2016  . Morbid obesity ([place]) 10/15/2016  . Bipolar 1 disorder [facility])     Past Surgical History:  Procedure Laterality Date  . CESAREAN SECTION    . CHOLECYSTECTOMY    . DILATION AND CURETTAGE OF UTERUS    . FOREIGN BODY REMOVAL Left 11/18/2016   Procedure: REMOVAL FOREIGN BODY EXTREMITY LEFT FOOT;  Surgeon: Trula Slade, DPM;  Location: [name];  Service: Podiatry;  Laterality: Left;  .  PILONIDAL CYST EXCISION  1990's  . TENDON REPAIR Left    Left Ankle  . WISDOM TOOTH EXTRACTION  19090's     OB History    Gravida  3   Para  3   Term  3   Preterm      AB      Living  3     SAB      TAB      Ectopic      Multiple      Live Births  3            Home Medications    Prior to Admission medications   Medication Sig Start Date End Date Taking? Authorizing Provider  Cholecalciferol (VITAMIN D3) 5000 units CAPS Take 5,000 Units by mouth at bedtime.    [provider]  clonazePAM (KLONOPIN) 1 MG tablet Take 1 mg by mouth 2 (two) times daily as needed for anxiety. Taking 0.5mg  1-2 tabs BID prn    [provider]  gabapentin (NEURONTIN) 300 MG capsule Take 100 mg by mouth 2 (two) times daily.     [provider]  ibuprofen (ADVIL,MOTRIN) 200 MG tablet Take 400-600 mg by mouth daily as needed for headache or moderate pain.    [provider]  L-methylfolate Calcium 15 MG TABS Take 15 m by mouth daily.    [provider]  lamoTRIgine (LAMICTAL) 200 MG tablet Take 200 mg by mouth at bedtime.    [provider]  levonorgestrel (MIRENA) 20 MCG/24HR IUD 1 each by Intrauterine route once.    [provider]  loratadine (CLARITIN) 10 MG tablet Take 10 mg by mouth at bedtime.     [provider]  metFORMIN (GLUCOPHAGE) 1000 MG tablet Take 1 tablet (1,000 mg total) by mouth daily with breakfast. 11/30/17   Eber Jones, MD  Multiple Vitamin (MULTIVITAMIN) tablet Take 1 tablet by mouth Nightly.    [provider]  pantoprazole (PROTONIX) 40 MG tablet Take 40 mg by mouth daily.    [provider]  propranolol (INDERAL) 40 MG tablet Take 40 mg by mouth 2 (two) times daily.     [provider]  QUEtiapine (SEROQUEL) 400 MG tablet Take 200 mg at bedtime by mouth.     [provider]    Family History Family History  Problem Relation Age of Onset  . Cancer Mother        squamous cell carcinoma  . Hyperlipidemia Mother   . Depression Mother   . Anxiety disorder Mother   . Obesity Mother   . Heart disease Father        cardiomegaly, CHF  . Hyperlipidemia Father   . Hypertension Father   . Mental retardation Father   . High blood pressure Father   . Depression Father   . Anxiety disorder Father   . Obesity Father   . Cancer Maternal Grandmother   . Diabetes Maternal Grandmother   . Heart disease Maternal Grandmother   . Hyperlipidemia Maternal Grandmother   . Hypertension Maternal Grandmother   . Stroke Maternal Grandmother   . Heart disease Paternal Grandmother   . Hypertension Paternal Grandmother   . Heart disease Paternal Grandfather   . Hyperlipidemia Paternal Grandfather   . Mental illness Paternal  Grandfather   . Breast cancer Maternal Aunt   . Thyroid cancer Paternal Aunt     Social History Social History   Tobacco Use  . Smoking status:  Former Smoker    Years: 10.00    Types: Cigarettes    Last attempt to quit: 2003    Years since quitting: 16.3  . Smokeless tobacco: Never Used  Substance Use Topics  . Alcohol use: Yes    Alcohol/week: 0.0 oz    Comment: 1-2 per month   . Drug use: No     Allergies   Tamiflu [oseltamivir phosphate]; Cephalosporins; Latex; and Penicillins   Review of Systems Review of Systems  All other systems reviewed and are negative.    Physical Exam Updated Vital Signs BP 121/60 (BP Location: Right Arm)   Pulse 81   Temp 98 F (36.7 C) (Oral)   Resp 20   SpO2 97%   Physical Exam  Constitutional: She is oriented to person, place, and time. She appears well-developed and well-nourished. No distress.  HENT:  Head: Normocephalic and atraumatic.  Mouth/Throat: Oropharynx is clear and moist.  Eyes: Pupils are equal, round, and reactive to light. Conjunctivae and EOM are normal.  Neck: Normal range of motion. Neck supple.  Cardiovascular: Normal rate, regular rhythm and intact distal pulses.  No murmur heard. Pulmonary/Chest: Effort normal and breath sounds normal. No respiratory distress. She has no wheezes. She has no rales.  Abdominal: Soft. She exhibits no distension. There is no tenderness. There is no rebound and no guarding.  Musculoskeletal: Normal range of motion. She exhibits no edema or tenderness.       Right lower leg: She exhibits no edema.       Left lower leg: She exhibits no edema.  Neurological: She is alert and oriented to person, place, and time.  Skin: Skin is warm and dry. No rash noted. No erythema.  Psychiatric: She has a normal mood and affect. Her behavior is normal.  Nursing note and vitals reviewed.    ED Treatments / Results  Labs (all labs ordered are listed, but only abnormal results are  displayed) Labs Reviewed  CBC - Abnormal; Notable for the following components:      Result Value   Hemoglobin 11.1 (*)    HCT 35.4 (*)    All other components within normal limits  I-STAT BETA HCG BLOOD, ED ([facility], [facility], [facility] ONLY) - Abnormal; Notable for the following components:   I-stat hCG, quantitative 6.4 (*)    All other components within normal limits  COMPREHENSIVE METABOLIC PANEL  BRAIN NATRIURETIC PEPTIDE  I-STAT TROPONIN, ED  I-STAT TROPONIN, ED  I-STAT TROPONIN, ED    EKG EKG Interpretation  Date/Time:  Wednesday January 05 2018 08:36:24 EDT Ventricular Rate:  77 PR Interval:    QRS Duration: 94 QT Interval:  345 QTC Calculation: 391 R Axis:   70 Text Interpretation:  Sinus rhythm Low voltage, precordial leads Minimal ST elevation, inferior leads Baseline wander in lead(s) II III aVF V5 No previous tracing Confirmed by Blanchie Dessert [phone]) on 01/05/2018 8:39:44 AM Also confirmed by Blanchie Dessert [phone]), editor Lynder Parents [phone])  on 01/05/2018 9:28:09 AM   Radiology Dg Chest 2 View  Result Date: 01/05/2018 CLINICAL DATA:  Mid LEFT-sided chest pain.  Nonsmoker. EXAM: CHEST - 2 VIEW COMPARISON:  06/19/2016. FINDINGS: Normal heart size.  Clear lung fields.  No bony abnormality. IMPRESSION: No active cardiopulmonary disease.  Stable appearance from priors. Electronically Signed   By: Staci Righter M.D.   On: 01/05/2018 11:24    Procedures Procedures (including critical care time)  Medications Ordered in ED Medications  gi cocktail (Maalox,Lidocaine,Donnatal) (30 mLs Oral  Given 01/05/18 0923)     Initial Impression / Assessment and Plan / ED Course  I have reviewed the triage vital signs and the nursing notes.  Pertinent labs & imaging results that were available during my care of the patient were reviewed by me and considered in my medical decision making (see chart for details).     Patient is a 42 year old female presenting today with chest  discomfort that does not radiate and does not cause associated symptoms.  It is worse with a deep breath and leaning forward.  She denies any infectious symptoms and EKG today has a roaming baseline but is otherwise within normal limits without signs concerning for pericarditis or ACS. HEART score 1.  Possibility that this could be GI.  Also could be a result of taking meloxicam.  Patient is well-appearing on exam.  CBC, CMP, BNP, troponin, chest x-ray pending.  Patient given GI cocktail  11:14 AM Pt's initial labs wnl.  Pt initially improved after GI cocktail but then got worse when walked to the bathroom and became a little dizzy.  On evaluation patient's dizziness is more vertiginous and she states she has been having that for the last 1 month.  This is not associated with the pain.  12:39 PM Repeat troponin is negative.  Low suspicion that this is cardiac in nature but did tell patient she can follow-up with cardiology for provocative testing.  Patient is feeling better after meclizine.  Patient has walked to the bathroom several other times without any recurrent pain.  Will discharge home.  Final Clinical Impressions(s) / ED Diagnoses   Final diagnoses:  Atypical chest pain  Vertigo    ED Discharge Orders    None       Blanchie Dessert, MD 01/05/18 1242

## 2018-01-05 NOTE — ED Triage Notes (Signed)
Pt verbalizes awoke with left/mid chest pain at 0530; verbalizes feet/ankle swelling over past week or two.

## 2018-01-10 NOTE — Progress Notes (Signed)
Office: [phone]  /  Fax: [phone]   HPI:   Chief Complaint: OBESITY Caroline Gonzales is here to discuss her progress with her obesity treatment plan. She is on the portion control better and make smarter food choices, such as increase vegetables and decrease simple carbohydrates and is following her eating plan approximately 30 % of the time. She states she is walking 7,000 steps 7 days per week. Caroline Gonzales struggled with poor food choices for the past 3 weeks. Cravings for candy. Trying to make smarter choices at work.  Her weight is 266 lb (120.7 kg) today and has gained 4 pounds since her last visit. She has lost 12 lbs since starting treatment with [name].  Chest Pain Caroline Gonzales states chest pains awoke her from sleep 2 hours prior to exam. She reports pain like this 2 weeks ago. She is tearful and did not want to go to the emergency department. Her vitals are stable, she notes pain is worse with movement.  Pre-Diabetes Caroline Gonzales has a diagnosis of pre-diabetes based on her elevated Hgb A1c and was informed this puts her at greater risk of developing diabetes. She is significantly indulging in her food choices. She is taking metformin currently and continues to work on diet and exercise to decrease risk of diabetes. She denies nausea or hypoglycemia.  ALLERGIES: Allergies  Allergen Reactions  . Penicillins Shortness Of Breath and Rash    Has patient had a PCN reaction causing immediate rash, facial/tongue/throat swelling, SOB or lightheadedness with hypotension: Yes Has patient had a PCN reaction causing severe rash involving mucus membranes or skin necrosis: Yes Has patient had a PCN reaction that required hospitalization: No Has patient had a PCN reaction occurring within the last 10 years: No If all of the above answers are "NO", then may proceed with Cephalosporin use.   . Tamiflu [Oseltamivir Phosphate] Anaphylaxis  . Cephalosporins Rash  . Latex Rash    MEDICATIONS: Current  Outpatient Medications on File Prior to Visit  Medication Sig Dispense Refill  . Cholecalciferol (VITAMIN D3) 5000 units CAPS Take 5,000 Units by mouth at bedtime.    . clonazePAM (KLONOPIN) 1 MG tablet Take 3 mg by mouth 2 (two) times daily as needed for anxiety. Taking 0.5mg  1-2 tabs BID prn    . L-methylfolate Calcium 15 MG TABS Take 15 mg by mouth at bedtime.     . lamoTRIgine (LAMICTAL) 200 MG tablet Take 200 mg by mouth at bedtime.    [name] levonorgestrel (MIRENA) 20 MCG/24HR IUD 1 each by Intrauterine route once. Place 03/2013    . loratadine (CLARITIN) 10 MG tablet Take 10 mg by mouth at bedtime.     . metFORMIN (GLUCOPHAGE) 1000 MG tablet Take 1 tablet (1,000 mg total) by mouth daily with breakfast. 30 tablet 0  . Multiple Vitamin (MULTIVITAMIN) tablet Take 1 tablet by mouth at bedtime.     . pantoprazole (PROTONIX) 40 MG tablet Take 40 mg by mouth daily.    . propranolol (INDERAL) 40 MG tablet Take 40 mg by mouth 2 (two) times daily.      No current facility-administered medications on file prior to visit.     PAST MEDICAL HISTORY: Past Medical History:  Diagnosis Date  . Allergy    Zyrtec, Allegra.  . Anxiety    followed by Dr. Toy Cookey  . Back pain   . Bipolar 1 disorder ([place])   . Complication of anesthesia   . Depression   . Fibroid   . Gallbladder problem   .  Gastroparesis 09/14/2012   gastric emptying study in 2014  . GERD (gastroesophageal reflux disease)   . Pneumonia    2013ish  . PONV (postoperative nausea and vomiting)   . Pre-diabetes   . PTSD (post-traumatic stress disorder)   . Vitamin D deficiency     PAST SURGICAL HISTORY: Past Surgical History:  Procedure Laterality Date  . CESAREAN SECTION    . CHOLECYSTECTOMY    . DILATION AND CURETTAGE OF UTERUS    . FOREIGN BODY REMOVAL Left 11/18/2016   Procedure: REMOVAL FOREIGN BODY EXTREMITY LEFT FOOT;  Surgeon: Trula Slade, DPM;  Location: [facility];  Service: Podiatry;  Laterality: Left;  . PILONIDAL CYST  EXCISION  1990's  . TENDON REPAIR Left    Left Ankle  . WISDOM TOOTH EXTRACTION  19090's    SOCIAL HISTORY: Social History   Tobacco Use  . Smoking status: Former Smoker    Years: 10.00    Types: Cigarettes    Last attempt to quit: 2003    Years since quitting: 16.3  . Smokeless tobacco: Never Used  Substance Use Topics  . Alcohol use: Yes    Alcohol/week: 0.0 oz    Comment: 1-2 per month   . Drug use: No    FAMILY HISTORY: Family History  Problem Relation Age of Onset  . Cancer Mother        squamous cell carcinoma  . Hyperlipidemia Mother   . Depression Mother   . Anxiety disorder Mother   . Obesity Mother   . Heart disease Father        cardiomegaly, CHF  . Hyperlipidemia Father   . Hypertension Father   . Mental retardation Father   . High blood pressure Father   . Depression Father   . Anxiety disorder Father   . Obesity Father   . Cancer Maternal Grandmother   . Diabetes Maternal Grandmother   . Heart disease Maternal Grandmother   . Hyperlipidemia Maternal Grandmother   . Hypertension Maternal Grandmother   . Stroke Maternal Grandmother   . Heart disease Paternal Grandmother   . Hypertension Paternal Grandmother   . Heart disease Paternal Grandfather   . Hyperlipidemia Paternal Grandfather   . Mental illness Paternal Grandfather   . Breast cancer Maternal Aunt   . Thyroid cancer Paternal Aunt     ROS: Review of Systems  Constitutional: Negative for weight loss.  Cardiovascular: Positive for chest pain.  Gastrointestinal: Negative for nausea.  Endo/Heme/Allergies:       Negative hypoglycemia    PHYSICAL EXAM: Blood pressure 120/78, pulse 77, temperature 98.1 F (36.7 C), temperature source Oral, height 5\' 5"  (1.651 m), weight 266 lb (120.7 kg), SpO2 98 %. Body mass index is 44.26 kg/m. Physical Exam  Constitutional: She is oriented to person, place, and time. She appears well-developed and well-nourished.  Cardiovascular: Normal rate.    Pulmonary/Chest: Effort normal.  Musculoskeletal: Normal range of motion.  Neurological: She is oriented to person, place, and time.  Skin: Skin is warm and dry.  Psychiatric: She has a normal mood and affect. Her behavior is normal.  Vitals reviewed.   RECENT LABS AND TESTS: BMET    Component Value Date/Time   NA 138 01/05/2018 0915   NA 141 10/07/2017 1255   K 4.2 01/05/2018 0915   CL 107 01/05/2018 0915   CO2 23 01/05/2018 0915   GLUCOSE 97 01/05/2018 0915   BUN 17 01/05/2018 0915   BUN 18 10/07/2017 1255   CREATININE  0.93 01/05/2018 0915   CREATININE 0.95 06/27/2016 0933   CALCIUM 8.9 01/05/2018 0915   GFRNONAA >60 01/05/2018 0915   GFRAA >60 01/05/2018 0915   Lab Results  Component Value Date   HGBA1C 5.5 10/07/2017   HGBA1C 5.1 06/22/2017   HGBA1C 5.3 01/11/2017   HGBA1C 5.7 (H) 08/31/2016   HGBA1C 5.6 12/01/2015   Lab Results  Component Value Date   INSULIN 15.6 10/07/2017   INSULIN 25.2 (H) 06/22/2017   INSULIN 17.0 01/11/2017   INSULIN 17.4 08/31/2016   CBC    Component Value Date/Time   WBC 5.5 01/05/2018 0915   RBC 4.02 01/05/2018 0915   HGB 11.1 (L) 01/05/2018 0915   HGB 11.2 06/22/2017 0904   HCT 35.4 (L) 01/05/2018 0915   HCT 34.8 06/22/2017 0904   PLT 285 01/05/2018 0915   PLT 332 11/12/2016 1651   MCV 88.1 01/05/2018 0915   MCV 86 06/22/2017 0904   MCH 27.6 01/05/2018 0915   MCHC 31.4 01/05/2018 0915   RDW 14.4 01/05/2018 0915   RDW 15.7 (H) 06/22/2017 0904   LYMPHSABS 1.5 06/22/2017 0904   MONOABS 891 04/30/2016 0947   EOSABS 0.2 06/22/2017 0904   BASOSABS 0.0 06/22/2017 0904   Iron/TIBC/Ferritin/ %Sat    Component Value Date/Time   IRON 53 08/31/2016 1644   TIBC 280 08/31/2016 1644   FERRITIN 36 08/31/2016 1644   IRONPCTSAT 19 08/31/2016 1644   Lipid Panel     Component Value Date/Time   CHOL 162 10/07/2017 1255   TRIG 153 (H) 10/07/2017 1255   HDL 42 10/07/2017 1255   CHOLHDL 4.1 12/01/2015 0914   VLDL 37 (H)  12/01/2015 0914   LDLCALC 89 10/07/2017 1255   Hepatic Function Panel     Component Value Date/Time   PROT 7.9 01/05/2018 0915   PROT 7.3 10/07/2017 1255   ALBUMIN 3.5 01/05/2018 0915   ALBUMIN 4.0 10/07/2017 1255   AST 20 01/05/2018 0915   ALT 18 01/05/2018 0915   ALKPHOS 75 01/05/2018 0915   BILITOT 0.5 01/05/2018 0915   BILITOT <0.2 10/07/2017 1255      Component Value Date/Time   TSH 1.620 08/31/2016 1644   TSH 1.83 12/01/2015 0914    ASSESSMENT AND PLAN: Other chest pain  Prediabetes  Class 3 severe obesity with serious comorbidity and body mass index (BMI) of 40.0 to 44.9 in adult, unspecified obesity type [facility])  PLAN:  Chest Pain Caroline Gonzales was advised to go to the emergency department for further evaluation. She did not feel as though she needed workup but was strongly encouraged to go to the emergency department for evaluation. Caroline Gonzales agrees to follow up with our clinic in 2 weeks.  Pre-Diabetes Caroline Gonzales will continue to work on weight loss, exercise, and decreasing simple carbohydrates in her diet to help decrease the risk of diabetes. We dicussed metformin including benefits and risks. She was informed that eating too many simple carbohydrates or too many calories at one sitting increases the likelihood of GI side effects. Caroline Gonzales agrees to continue taking metformin, will need labs within the next two weeks. Caroline Gonzales agrees to follow up with our clinic in 2 weeks as directed to monitor her progress.  We spent > than 50% of the 15 minute visit on the counseling as documented in the note.  Obesity Keina is currently in the action stage of change. As such, her goal is to continue with weight loss efforts She has agreed to portion control better and make smarter  food choices, such as increase vegetables and decrease simple carbohydrates  Breanda has been instructed to work up to a goal of 150 minutes of combined cardio and strengthening exercise per week for weight  loss and overall health benefits. We discussed the following Behavioral Modification Strategies today: increasing lean protein intake, avoiding temptations, and better snacking choices   Steven has agreed to follow up with our clinic in 2 weeks. She was informed of the importance of frequent follow up visits to maximize her success with intensive lifestyle modifications for her multiple health conditions.   I, Caroline Gonzales, am acting as transcriptionist for Ilene Qua, MD  I have reviewed the above documentation for accuracy and completeness, and I agree with the above. - Ilene Qua, MD

## 2018-01-19 ENCOUNTER — Encounter: Payer: Self-pay | Admitting: Family Medicine

## 2018-01-20 ENCOUNTER — Ambulatory Visit (INDEPENDENT_AMBULATORY_CARE_PROVIDER_SITE_OTHER): Payer: 59 | Admitting: Family Medicine

## 2018-01-20 VITALS — BP 129/84 | HR 91 | Temp 97.7°F | Ht 65.0 in | Wt 267.0 lb

## 2018-01-20 DIAGNOSIS — E781 Pure hyperglyceridemia: Secondary | ICD-10-CM

## 2018-01-20 DIAGNOSIS — R7303 Prediabetes: Secondary | ICD-10-CM

## 2018-01-20 DIAGNOSIS — Z6841 Body Mass Index (BMI) 40.0 and over, adult: Secondary | ICD-10-CM | POA: Diagnosis not present

## 2018-01-21 MED FILL — PROPRANOLOL 40 MG TABLET: 40 | 30 days supply | Qty: 60 | Fill #0

## 2018-01-21 MED FILL — clonazePAM 0.5 MG TABS: 0.5 | 30 days supply | Qty: 120 | Fill #2

## 2018-01-23 NOTE — Telephone Encounter (Signed)
Please schedule patient in [place] appointment in upcoming 1-2 weeks for ED follow-up for chest pain.

## 2018-01-24 DIAGNOSIS — K210 Gastro-esophageal reflux disease with esophagitis: Secondary | ICD-10-CM | POA: Diagnosis not present

## 2018-01-24 DIAGNOSIS — K3184 Gastroparesis: Secondary | ICD-10-CM | POA: Diagnosis not present

## 2018-01-24 NOTE — Progress Notes (Signed)
Office: [phone]  /  Fax: [phone]   HPI:   Chief Complaint: OBESITY Caroline Gonzales is here to discuss her progress with her obesity treatment plan. She is on the portion control better and make smarter food choices, such as increase vegetables and decrease simple carbohydrates and is following her eating plan approximately 40-50 % of the time. She states she is walking 7,500 steps daily. Caroline Gonzales is looking to get back on track. Wants to discuss various options to substitute as she is a picky eater.  Her weight is 267 lb (121.1 kg) today and has gained 1 pound since her last visit. She has lost 11 lbs since starting treatment with [name].  Pre-Diabetes Caroline Gonzales has a diagnosis of pre-diabetes based on her elevated Hgb A1c and was informed this puts her at greater risk of developing diabetes.Hgb A1c last done in January of 2019. She notes carbohydrate cravings. She is taking metformin currently and continues to work on diet and exercise to decrease risk of diabetes. She denies nausea or hypoglycemia.  Hypertriglyceridemia Caroline Gonzales's last FLP was in January 2019, and triglycerides of 153.  ALLERGIES: Allergies  Allergen Reactions  . Penicillins Shortness Of Breath and Rash    Has patient had a PCN reaction causing immediate rash, facial/tongue/throat swelling, SOB or lightheadedness with hypotension: Yes Has patient had a PCN reaction causing severe rash involving mucus membranes or skin necrosis: Yes Has patient had a PCN reaction that required hospitalization: No Has patient had a PCN reaction occurring within the last 10 years: No If all of the above answers are "NO", then may proceed with Cephalosporin use.   . Tamiflu [Oseltamivir Phosphate] Anaphylaxis  . Cephalosporins Rash  . Latex Rash    MEDICATIONS: Current Outpatient Medications on File Prior to Visit  Medication Sig Dispense Refill  . Cholecalciferol (VITAMIN D3) 5000 units CAPS Take 5,000 Units by mouth at bedtime.    .  clonazePAM (KLONOPIN) 0.5 MG tablet Take 1.5 mg by mouth at bedtime.    . clonazePAM (KLONOPIN) 1 MG tablet Take 3 mg by mouth 2 (two) times daily as needed for anxiety. Taking 0.5mg  1-2 tabs BID prn    . L-methylfolate Calcium 15 MG TABS Take 15 mg by mouth at bedtime.     . lamoTRIgine (LAMICTAL) 200 MG tablet Take 200 mg by mouth at bedtime.    [name] levonorgestrel (MIRENA) 20 MCG/24HR IUD 1 each by Intrauterine route once. Place 03/2013    . loratadine (CLARITIN) 10 MG tablet Take 10 mg by mouth at bedtime.     . metFORMIN (GLUCOPHAGE) 1000 MG tablet Take 1 tablet (1,000 mg total) by mouth daily with breakfast. 30 tablet 0  . Multiple Vitamin (MULTIVITAMIN) tablet Take 1 tablet by mouth at bedtime.     . pantoprazole (PROTONIX) 40 MG tablet Take 40 mg by mouth daily.    . propranolol (INDERAL) 40 MG tablet Take 40 mg by mouth 2 (two) times daily.     . QUEtiapine (SEROQUEL) 200 MG tablet Take 100 mg by mouth at bedtime.     No current facility-administered medications on file prior to visit.     PAST MEDICAL HISTORY: Past Medical History:  Diagnosis Date  . Allergy    Zyrtec, Allegra.  . Anxiety    followed by Dr. Toy Cookey  . Back pain   . Bipolar 1 disorder ([place])   . Complication of anesthesia   . Depression   . Fibroid   . Gallbladder problem   .  Gastroparesis 09/14/2012   gastric emptying study in 2014  . GERD (gastroesophageal reflux disease)   . Pneumonia    2013ish  . PONV (postoperative nausea and vomiting)   . Pre-diabetes   . PTSD (post-traumatic stress disorder)   . Vitamin D deficiency     PAST SURGICAL HISTORY: Past Surgical History:  Procedure Laterality Date  . CESAREAN SECTION    . CHOLECYSTECTOMY    . DILATION AND CURETTAGE OF UTERUS    . FOREIGN BODY REMOVAL Left 11/18/2016   Procedure: REMOVAL FOREIGN BODY EXTREMITY LEFT FOOT;  Surgeon: Trula Slade, DPM;  Location: [facility];  Service: Podiatry;  Laterality: Left;  . PILONIDAL CYST EXCISION  1990's  .  TENDON REPAIR Left    Left Ankle  . WISDOM TOOTH EXTRACTION  19090's    SOCIAL HISTORY: Social History   Tobacco Use  . Smoking status: Former Smoker    Years: 10.00    Types: Cigarettes    Last attempt to quit: 2003    Years since quitting: 16.3  . Smokeless tobacco: Never Used  Substance Use Topics  . Alcohol use: Yes    Alcohol/week: 0.0 oz    Comment: 1-2 per month   . Drug use: No    FAMILY HISTORY: Family History  Problem Relation Age of Onset  . Cancer Mother        squamous cell carcinoma  . Hyperlipidemia Mother   . Depression Mother   . Anxiety disorder Mother   . Obesity Mother   . Heart disease Father        cardiomegaly, CHF  . Hyperlipidemia Father   . Hypertension Father   . Mental retardation Father   . High blood pressure Father   . Depression Father   . Anxiety disorder Father   . Obesity Father   . Cancer Maternal Grandmother   . Diabetes Maternal Grandmother   . Heart disease Maternal Grandmother   . Hyperlipidemia Maternal Grandmother   . Hypertension Maternal Grandmother   . Stroke Maternal Grandmother   . Heart disease Paternal Grandmother   . Hypertension Paternal Grandmother   . Heart disease Paternal Grandfather   . Hyperlipidemia Paternal Grandfather   . Mental illness Paternal Grandfather   . Breast cancer Maternal Aunt   . Thyroid cancer Paternal Aunt     ROS: Review of Systems  Constitutional: Negative for weight loss.  Gastrointestinal: Negative for nausea.  Endo/Heme/Allergies:       Negative hypoglycemia    PHYSICAL EXAM: Blood pressure 129/84, pulse 91, temperature 97.7 F (36.5 C), temperature source Oral, height 5\' 5"  (1.651 m), weight 267 lb (121.1 kg), SpO2 99 %. Body mass index is 44.43 kg/m. Physical Exam  Constitutional: She is oriented to person, place, and time. She appears well-developed and well-nourished.  Cardiovascular: Normal rate.  Pulmonary/Chest: Effort normal.  Musculoskeletal: Normal range of  motion.  Neurological: She is oriented to person, place, and time.  Skin: Skin is warm and dry.  Psychiatric: She has a normal mood and affect. Her behavior is normal.  Vitals reviewed.   RECENT LABS AND TESTS: BMET    Component Value Date/Time   NA 138 01/05/2018 0915   NA 141 10/07/2017 1255   K 4.2 01/05/2018 0915   CL 107 01/05/2018 0915   CO2 23 01/05/2018 0915   GLUCOSE 97 01/05/2018 0915   BUN 17 01/05/2018 0915   BUN 18 10/07/2017 1255   CREATININE 0.93 01/05/2018 0915   CREATININE 0.95  06/27/2016 0933   CALCIUM 8.9 01/05/2018 0915   GFRNONAA >60 01/05/2018 0915   GFRAA >60 01/05/2018 0915   Lab Results  Component Value Date   HGBA1C 5.5 10/07/2017   HGBA1C 5.1 06/22/2017   HGBA1C 5.3 01/11/2017   HGBA1C 5.7 (H) 08/31/2016   HGBA1C 5.6 12/01/2015   Lab Results  Component Value Date   INSULIN 15.6 10/07/2017   INSULIN 25.2 (H) 06/22/2017   INSULIN 17.0 01/11/2017   INSULIN 17.4 08/31/2016   CBC    Component Value Date/Time   WBC 5.5 01/05/2018 0915   RBC 4.02 01/05/2018 0915   HGB 11.1 (L) 01/05/2018 0915   HGB 11.2 06/22/2017 0904   HCT 35.4 (L) 01/05/2018 0915   HCT 34.8 06/22/2017 0904   PLT 285 01/05/2018 0915   PLT 332 11/12/2016 1651   MCV 88.1 01/05/2018 0915   MCV 86 06/22/2017 0904   MCH 27.6 01/05/2018 0915   MCHC 31.4 01/05/2018 0915   RDW 14.4 01/05/2018 0915   RDW 15.7 (H) 06/22/2017 0904   LYMPHSABS 1.5 06/22/2017 0904   MONOABS 891 04/30/2016 0947   EOSABS 0.2 06/22/2017 0904   BASOSABS 0.0 06/22/2017 0904   Iron/TIBC/Ferritin/ %Sat    Component Value Date/Time   IRON 53 08/31/2016 1644   TIBC 280 08/31/2016 1644   FERRITIN 36 08/31/2016 1644   IRONPCTSAT 19 08/31/2016 1644   Lipid Panel     Component Value Date/Time   CHOL 162 10/07/2017 1255   TRIG 153 (H) 10/07/2017 1255   HDL 42 10/07/2017 1255   CHOLHDL 4.1 12/01/2015 0914   VLDL 37 (H) 12/01/2015 0914   LDLCALC 89 10/07/2017 1255   Hepatic Function Panel       Component Value Date/Time   PROT 7.9 01/05/2018 0915   PROT 7.3 10/07/2017 1255   ALBUMIN 3.5 01/05/2018 0915   ALBUMIN 4.0 10/07/2017 1255   AST 20 01/05/2018 0915   ALT 18 01/05/2018 0915   ALKPHOS 75 01/05/2018 0915   BILITOT 0.5 01/05/2018 0915   BILITOT <0.2 10/07/2017 1255      Component Value Date/Time   TSH 1.620 08/31/2016 1644   TSH 1.83 12/01/2015 0914    ASSESSMENT AND PLAN: Prediabetes  Hypertriglyceridemia  Class 3 severe obesity with serious comorbidity and body mass index (BMI) of 40.0 to 44.9 in adult, unspecified obesity type Caroline Gonzales)  PLAN:  Pre-Diabetes Caroline Gonzales will continue to work on weight loss, exercise, and decreasing simple carbohydrates in her diet to help decrease the risk of diabetes. We dicussed metformin including benefits and risks. She was informed that eating too many simple carbohydrates or too many calories at one sitting increases the likelihood of GI side effects. Caroline Gonzales agrees to continue taking metformin and she agrees to follow up with our clinic in 2 weeks as directed to monitor her progress.  Hypertriglyceridemia We will recheck labs at next visit and Caroline Gonzales agrees to follow up with our clinic in 2 weeks.  We spent > than 50% of the 15 minute visit on the counseling as documented in the note.  Obesity Caroline Gonzales is currently in the action stage of change. As such, her goal is to continue with weight loss efforts She has agreed to follow the Category 3 plan Caroline Gonzales has been instructed to work up to a goal of 150 minutes of combined cardio and strengthening exercise per week for weight loss and overall health benefits. We discussed the following Behavioral Modification Strategies today: increasing lean protein intake, increasing vegetables,  work on meal planning and easy cooking plans, better snacking choices, and planning for success   Caroline Gonzales has agreed to follow up with our clinic in 2 weeks. She was informed of the importance of  frequent follow up visits to maximize her success with intensive lifestyle modifications for her multiple health conditions.   I, Trixie Dredge, Caroline acting as transcriptionist for Ilene Qua, MD  I have reviewed the above documentation for accuracy and completeness, and I agree with the above. - Ilene Qua, MD

## 2018-01-31 DIAGNOSIS — F4322 Adjustment disorder with anxiety: Secondary | ICD-10-CM | POA: Diagnosis not present

## 2018-02-01 DIAGNOSIS — F4310 Post-traumatic stress disorder, unspecified: Secondary | ICD-10-CM | POA: Diagnosis not present

## 2018-02-02 ENCOUNTER — Other Ambulatory Visit: Payer: Self-pay

## 2018-02-02 ENCOUNTER — Encounter: Payer: Self-pay | Admitting: Family Medicine

## 2018-02-02 ENCOUNTER — Ambulatory Visit: Payer: 59 | Admitting: Family Medicine

## 2018-02-02 VITALS — BP 118/72 | HR 87 | Temp 98.0°F | Resp 16 | Ht 65.16 in | Wt 261.0 lb

## 2018-02-02 DIAGNOSIS — E7849 Other hyperlipidemia: Secondary | ICD-10-CM

## 2018-02-02 DIAGNOSIS — E8881 Metabolic syndrome: Secondary | ICD-10-CM

## 2018-02-02 DIAGNOSIS — F319 Bipolar disorder, unspecified: Secondary | ICD-10-CM | POA: Diagnosis not present

## 2018-02-02 DIAGNOSIS — E88819 Insulin resistance, unspecified: Secondary | ICD-10-CM

## 2018-02-02 DIAGNOSIS — R0789 Other chest pain: Secondary | ICD-10-CM

## 2018-02-02 NOTE — Progress Notes (Signed)
Subjective:    Patient ID: Caroline Gonzales, female    DOB: [birth year], 42 y.o.   MRN: [id number]  02/02/2018  Hospitalization Follow-up (pt was seen in the ER on 4/24 for chest pain, she states she feels better and has not had any chest pain since ER visit. )    HPI This 42 y.o. female presents for evaluation of ED follow-up on 01/05/18 for chest pain. Details of ED provider's note include:  Patient is a 42 year old female presenting today with chest discomfort that does not radiate and does not cause associated symptoms.  It is worse with a deep breath and leaning forward.  She denies any infectious symptoms and EKG today has a roaming baseline but is otherwise within normal limits without signs concerning for pericarditis or ACS. HEART score 1.  Possibility that this could be GI.  Also could be a result of taking meloxicam.  Patient is well-appearing on exam.  CBC, CMP, BNP, troponin, chest x-ray pending.  Patient given GI cocktail. Continuous telemetry.  Scheduled for EGD on 02/16/18.    LEFT sided chest pain; squeezing pain in L shoulder.   No recurrence.  Dizziness 1-2 episodes since then.  Not exercising; getting 7500  Steps per day. No fatigue.  No DOE.  Kids were out of town for the week.  Stopped MEloxicam and Gabapentin. Had seen [facility] that morning; had woken her up that morning. No sweating or clammy. Improved with GI cocktail. Follow-up with [facility]; last EGD five years ago. When got up to go to bathroom, it came back. Has panic attack; was calm; not anxious.  No chest pain usually with panic attack. Had been suffering with leg swelling; tight swelling of b legs.   Dizzy also; worried about pregnancy; has IUD.  Swelling resolved.    No SOB.  Dizziness only with sitting; room was not spinning; it was spinning in front of patinet.  Balance not off.  Not drinking anything new. Two alcoholic drinks per week. No increased salt.   Taking propanolol in morning for anxiety.    Same doses for years. Meloxicam and Gabapentin were pretty new; started in March.  ESI caused neuritis.     BP Readings from Last 3 Encounters:  03/29/18 129/78  03/24/18 121/80  03/24/18 127/84   Wt Readings from Last 3 Encounters:  03/29/18 266 lb (120.7 kg)  03/24/18 265 lb 12.8 oz (120.6 kg)  03/24/18 266 lb (120.7 kg)   Immunization History  Administered Date(s) Administered  . Influenza Split 06/15/2015, 06/08/2016  . Tdap 09/15/2015    Review of Systems  Constitutional: Negative for activity change, appetite change, chills, diaphoresis, fatigue, fever and unexpected weight change.  HENT: Negative for congestion, dental problem, drooling, ear discharge, ear pain, facial swelling, hearing loss, mouth sores, nosebleeds, postnasal drip, rhinorrhea, sinus pressure, sneezing, sore throat, tinnitus, trouble swallowing and voice change.   Eyes: Negative for photophobia, pain, discharge, redness, itching and visual disturbance.  Respiratory: Negative for apnea, cough, choking, chest tightness, shortness of breath, wheezing and stridor.   Cardiovascular: Negative for chest pain, palpitations and leg swelling.  Gastrointestinal: Negative for abdominal distention, abdominal pain, anal bleeding, blood in stool, constipation, diarrhea, nausea, rectal pain and vomiting.  Endocrine: Negative for cold intolerance, heat intolerance, polydipsia, polyphagia and polyuria.  Genitourinary: Negative for decreased urine volume, difficulty urinating, dyspareunia, dysuria, enuresis, flank pain, frequency, genital sores, hematuria, menstrual problem, pelvic pain, urgency, vaginal bleeding, vaginal discharge and vaginal pain.  Musculoskeletal: Negative for arthralgias,  back pain, gait problem, joint swelling, myalgias, neck pain and neck stiffness.  Skin: Negative for color change, pallor, rash and wound.  Allergic/Immunologic: Negative for environmental allergies, food allergies and immunocompromised  state.  Neurological: Negative for dizziness, tremors, seizures, syncope, facial asymmetry, speech difficulty, weakness, light-headedness, numbness and headaches.  Hematological: Negative for adenopathy. Does not bruise/bleed easily.  Psychiatric/Behavioral: Negative for agitation, behavioral problems, confusion, decreased concentration, dysphoric mood, hallucinations, self-injury, sleep disturbance and suicidal ideas. The patient is not nervous/anxious and is not hyperactive.     Past Medical History:  Diagnosis Date  . Allergy    Zyrtec, Allegra.  . Anxiety    followed by Dr. Toy Cookey  . Back pain   . Bipolar 1 disorder ([place])   . Complication of anesthesia   . Depression   . Fibroid   . Gallbladder problem   . Gastroparesis 09/14/2012   gastric emptying study in 2014  . GERD (gastroesophageal reflux disease)   . Pneumonia    2013ish  . PONV (postoperative nausea and vomiting)   . Pre-diabetes   . PTSD (post-traumatic stress disorder)   . Vitamin D deficiency    Past Surgical History:  Procedure Laterality Date  . ANKLE ARTHROSCOPY Left 2011  . CESAREAN SECTION    . CHOLECYSTECTOMY    . DILATION AND CURETTAGE OF UTERUS    . FOREIGN BODY REMOVAL Left 11/18/2016   Procedure: REMOVAL FOREIGN BODY EXTREMITY LEFT FOOT;  Surgeon: Trula Slade, DPM;  Location: [facility] [place];  Service: Podiatry;  Laterality: Left;  . PILONIDAL CYST EXCISION  1990's  . TENDON REPAIR Left 2011   Left Ankle  . WISDOM TOOTH EXTRACTION  19090's   Allergies  Allergen Reactions  . Penicillins Shortness Of Breath and Rash    Has patient had a PCN reaction causing immediate rash, facial/tongue/throat swelling, SOB or lightheadedness with hypotension: Yes Has patient had a PCN reaction causing severe rash involving mucus membranes or skin necrosis: Yes Has patient had a PCN reaction that required hospitalization: No Has patient had a PCN reaction occurring within the last 10 years: No If all of the above  answers are "NO", then may proceed with Cephalosporin use.   . Tamiflu [Oseltamivir Phosphate] Anaphylaxis  . Cephalosporins Rash  . Latex Rash   Current Outpatient Medications on File Prior to Visit  Medication Sig Dispense Refill  . Cholecalciferol (VITAMIN D3) 5000 units CAPS Take 5,000 Units by mouth at bedtime.    . clonazePAM (KLONOPIN) 0.5 MG tablet Take 1.5 mg by mouth at bedtime.    . L-methylfolate Calcium 15 MG TABS Take 15 mg by mouth at bedtime.     . lamoTRIgine (LAMICTAL) 200 MG tablet Take 200 mg by mouth at bedtime.    [name] levonorgestrel (MIRENA) 20 MCG/24HR IUD 1 each by Intrauterine route once. Place 03/2013    . loratadine (CLARITIN) 10 MG tablet Take 10 mg by mouth at bedtime.     . Multiple Vitamin (MULTIVITAMIN) tablet Take 1 tablet by mouth at bedtime.     . pantoprazole (PROTONIX) 40 MG tablet Take 40 mg by mouth daily.    . propranolol (INDERAL) 40 MG tablet Take 40 mg by mouth 2 (two) times daily.     . QUEtiapine (SEROQUEL) 200 MG tablet Take 100 mg by mouth at bedtime.     No current facility-administered medications on file prior to visit.    Social History   Socioeconomic History  . Marital status: Married  Spouse name: Not on file  . Number of children: Not on file  . Years of education: Not on file  . Highest education level: Not on file  Occupational History  . Occupation: Programmer, multimedia: [employer]  . Financial resource strain: Not on file  . Food insecurity:    Worry: Not on file    Inability: Not on file  . Transportation needs:    Medical: Not on file    Non-medical: Not on file  Tobacco Use  . Smoking status: Former Smoker    Years: 10.00    Types: Cigarettes    Last attempt to quit: 2003    Years since quitting: 16.5  . Smokeless tobacco: Never Used  Substance and Sexual Activity  . Alcohol use: Yes    Alcohol/week: 0.0 oz    Comment: 1-2 per month   . Drug use: No  . Sexual activity: Yes    Partners: Male     Birth control/protection: IUD  Lifestyle  . Physical activity:    Days per week: Not on file    Minutes per session: Not on file  . Stress: Not on file  Relationships  . Social connections:    Talks on phone: Not on file    Gets together: Not on file    Attends religious service: Not on file    Active member of club or organization: Not on file    Attends meetings of clubs or organizations: Not on file    Relationship status: Not on file  . Intimate partner violence:    Fear of current or ex partner: Not on file    Emotionally abused: Not on file    Physically abused: Not on file    Forced sexual activity: Not on file  Other Topics Concern  . 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      Seatbelt: 100%; no texting while driving often       Family History  Problem Relation Age of Onset  . Cancer Mother        squamous cell carcinoma  . Hyperlipidemia Mother   . Depression Mother   . Anxiety disorder Mother   . Obesity Mother   . Heart disease Father 26       cardiomegaly, CHF; steroid use.  [name] Hyperlipidemia Father   . Hypertension Father   . Mental retardation Father   . High blood pressure Father   . Depression Father   . Anxiety disorder Father   . Obesity Father   . Cancer Maternal Grandmother   . Diabetes Maternal Grandmother   . Heart disease Maternal Grandmother   . Hyperlipidemia Maternal Grandmother   . Hypertension Maternal Grandmother   . Stroke Maternal Grandmother   . Heart disease Paternal Grandmother   . Hypertension Paternal Grandmother   . Heart disease Paternal Grandfather   . Hyperlipidemia Paternal Grandfather   . Mental  illness Paternal Grandfather   . Rheum arthritis Sister   . Breast  cancer Maternal Aunt   . Thyroid cancer Paternal Aunt        Objective:    BP 118/72   Pulse 87   Temp 98 F (36.7 C) (Oral)   Resp 16   Ht 5' 5.16" (1.655 m)   Wt 261 lb (118.4 kg)   SpO2 97%   BMI 43.22 kg/m  Physical Exam  Constitutional: She is oriented to person, place, and time. She appears well-developed and well-nourished. No distress.  HENT:  Head: Normocephalic and atraumatic.  Right Ear: External ear normal.  Left Ear: External ear normal.  Nose: Nose normal.  Mouth/Throat: Oropharynx is clear and moist.  Eyes: Pupils are equal, round, and reactive to light. Conjunctivae and EOM are normal.  Neck: Normal range of motion and full passive range of motion without pain. Neck supple. No JVD present. Carotid bruit is not present. No thyromegaly present.  Cardiovascular: Normal rate, regular rhythm and normal heart sounds. Exam reveals no gallop and no friction rub.  No murmur heard. Pulmonary/Chest: Effort normal and breath sounds normal. No stridor. No respiratory distress. She has no wheezes. She has no rales. She exhibits no tenderness.  Abdominal: Soft. Bowel sounds are normal. She exhibits no distension and no mass. There is no tenderness. There is no rebound and no guarding.  Musculoskeletal: She exhibits no edema.       Right shoulder: Normal.       Left shoulder: Normal.       Cervical back: Normal.  Lymphadenopathy:    She has no cervical adenopathy.  Neurological: She is alert and oriented to person, place, and time. She has normal reflexes. No cranial nerve deficit. She exhibits normal muscle tone. Coordination normal.  Skin: Skin is warm and dry. No rash noted. She is not diaphoretic. No erythema. No pallor.  Psychiatric: She has a normal mood and affect. Her behavior is normal. Judgment and thought content normal.  Nursing note and vitals reviewed.  No results found. Depression screen [facility]  2/9 03/29/2018 02/02/2018 04/14/2017 11/12/2016 08/31/2016  Decreased Interest 0 0 0 0 1  Down, Depressed, Hopeless 1 0 0 0 3  PHQ - 2 Score 1 0 0 0 4  Altered sleeping 3 - - - 1  Tired, decreased energy 1 - - - 3  Change in appetite 3 - - - 3  Feeling bad or failure about yourself  2 - - - 3  Trouble concentrating 0 - - - 0  Moving slowly or fidgety/restless 0 - - - 2  Suicidal thoughts 0 - - - 1  PHQ-9 Score 10 - - - 17   Fall Risk  02/02/2018 04/14/2017 11/12/2016 06/27/2016 06/19/2016  Falls in the past year? No No No No No        Assessment & Plan:   1. Other chest pain   2. Insulin resistance   3. Other hyperlipidemia   4. Bipolar depression ([place])   5. Morbid obesity ([place])     Atypical chest pain: New.  Status post emergency department evaluation.  Underwent extensive work-up including troponins x 3, negative BNP, serial EKGs, chest x-ray.  Repeat EKG in the office today normal and reassuring.  No exertional component to chest pain making cardiac etiology less likely.  If recurs, patient is agreeable to undergoing cardiology consultation.  No recent sedentary lifestyle or travel or surgery to suggest pulmonary embolism.  Orders Placed This Encounter  Procedures  . EKG 12-Lead   No orders of the  defined types were placed in this encounter.   No follow-ups on file.   Caroline Gonzales, M.D. [facility] [place] [address] [place], [place]  [address] [phone] phone [phone] fax

## 2018-02-02 NOTE — Patient Instructions (Signed)
     IF you received an x-ray today, you will receive an invoice from [facility]. Please contact [facility] at [phone] with questions or concerns regarding your invoice.   IF you received labwork today, you will receive an invoice from [facility]. Please contact [facility] at [phone] with questions or concerns regarding your invoice.   Our billing staff will not be able to assist you with questions regarding bills from these companies.  You will be contacted with the lab results as soon as they are available. The fastest way to get your results is to activate your My Chart account. Instructions are located on the last page of this paperwork. If you have not heard from us regarding the results in 2 weeks, please contact this office.     

## 2018-02-03 ENCOUNTER — Encounter: Payer: Self-pay | Admitting: Family Medicine

## 2018-02-09 ENCOUNTER — Other Ambulatory Visit (INDEPENDENT_AMBULATORY_CARE_PROVIDER_SITE_OTHER): Payer: Self-pay | Admitting: Family Medicine

## 2018-02-09 ENCOUNTER — Ambulatory Visit (INDEPENDENT_AMBULATORY_CARE_PROVIDER_SITE_OTHER): Payer: 59 | Admitting: Family Medicine

## 2018-02-09 VITALS — BP 135/77 | HR 85 | Temp 98.1°F | Ht 65.0 in | Wt 260.0 lb

## 2018-02-09 DIAGNOSIS — Z6841 Body Mass Index (BMI) 40.0 and over, adult: Secondary | ICD-10-CM | POA: Diagnosis not present

## 2018-02-09 DIAGNOSIS — R7303 Prediabetes: Secondary | ICD-10-CM

## 2018-02-09 DIAGNOSIS — E559 Vitamin D deficiency, unspecified: Secondary | ICD-10-CM

## 2018-02-09 DIAGNOSIS — Z9189 Other specified personal risk factors, not elsewhere classified: Secondary | ICD-10-CM

## 2018-02-09 DIAGNOSIS — H00023 Hordeolum internum right eye, unspecified eyelid: Secondary | ICD-10-CM | POA: Diagnosis not present

## 2018-02-09 DIAGNOSIS — H2189 Other specified disorders of iris and ciliary body: Secondary | ICD-10-CM | POA: Diagnosis not present

## 2018-02-09 DIAGNOSIS — H04123 Dry eye syndrome of bilateral lacrimal glands: Secondary | ICD-10-CM | POA: Diagnosis not present

## 2018-02-09 MED ORDER — METFORMIN HCL 1000 MG PO TABS: 1000.0000 mg | ORAL_TABLET | Freq: Every day | ORAL | 0 refills | Status: DC

## 2018-02-09 MED FILL — PANTOPRAZOLE SOD DR 40 MG T: 40 | 30 days supply | Qty: 30 | Fill #2

## 2018-02-09 MED FILL — NEO/POLY/DEXAMET EYE OINT: 3.5-10000-0 | 14 days supply | Qty: 4 | Fill #0

## 2018-02-09 MED FILL — metFORMIN HCL 1000 MG TABS: 1000 | 30 days supply | Qty: 30 | Fill #0

## 2018-02-10 DIAGNOSIS — R7303 Prediabetes: Secondary | ICD-10-CM | POA: Diagnosis not present

## 2018-02-10 DIAGNOSIS — E559 Vitamin D deficiency, unspecified: Secondary | ICD-10-CM | POA: Diagnosis not present

## 2018-02-10 NOTE — Progress Notes (Signed)
Office: [phone]  /  Fax: [phone]   HPI:   Chief Complaint: OBESITY Caroline Gonzales is here to discuss her progress with her obesity treatment plan. She is on the Category 3 plan and is following her eating plan approximately 80 % of the time. She states she is walking 7,500 steps daily. Eddy did really well following meal plan prior to [facility] Day weekend.  Her weight is 260 lb (117.9 kg) today and has had a weight loss of 7 pounds over a period of 3 weeks since her last visit. She has lost 18 lbs since starting treatment with [name].  Pre-Diabetes Nasha has a diagnosis of pre-diabetes based on her elevated Hgb A1c and was informed this puts her at greater risk of developing diabetes. She notes only a few episodes of GI upset but nothing consistent. She is taking metformin currently and continues to work on diet and exercise to decrease risk of diabetes. She denies nausea or hypoglycemia.  At risk for diabetes Cuma is at higher than average risk for developing diabetes due to her obesity and pre-diabetes. She currently denies polyuria or polydipsia.  Vitamin D Deficiency Tiye has a diagnosis of vitamin D deficiency. She is currently taking OTC Vit D. She notes fatigue and denies nausea, vomiting or muscle weakness.  ALLERGIES: Allergies  Allergen Reactions  . Penicillins Shortness Of Breath and Rash    Has patient had a PCN reaction causing immediate rash, facial/tongue/throat swelling, SOB or lightheadedness with hypotension: Yes Has patient had a PCN reaction causing severe rash involving mucus membranes or skin necrosis: Yes Has patient had a PCN reaction that required hospitalization: No Has patient had a PCN reaction occurring within the last 10 years: No If all of the above answers are "NO", then may proceed with Cephalosporin use.   . Tamiflu [Oseltamivir Phosphate] Anaphylaxis  . Cephalosporins Rash  . Latex Rash    MEDICATIONS: Current Outpatient Medications  on File Prior to Visit  Medication Sig Dispense Refill  . Cholecalciferol (VITAMIN D3) 5000 units CAPS Take 5,000 Units by mouth at bedtime.    . clonazePAM (KLONOPIN) 0.5 MG tablet Take 1.5 mg by mouth at bedtime.    . clonazePAM (KLONOPIN) 1 MG tablet Take 3 mg by mouth 2 (two) times daily as needed for anxiety. Taking 0.5mg  1-2 tabs BID prn    . L-methylfolate Calcium 15 MG TABS Take 15 mg by mouth at bedtime.     . lamoTRIgine (LAMICTAL) 200 MG tablet Take 200 mg by mouth at bedtime.    [name] levonorgestrel (MIRENA) 20 MCG/24HR IUD 1 each by Intrauterine route once. Place 03/2013    . loratadine (CLARITIN) 10 MG tablet Take 10 mg by mouth at bedtime.     . Multiple Vitamin (MULTIVITAMIN) tablet Take 1 tablet by mouth at bedtime.     . pantoprazole (PROTONIX) 40 MG tablet Take 40 mg by mouth daily.    . propranolol (INDERAL) 40 MG tablet Take 40 mg by mouth 2 (two) times daily.     . QUEtiapine (SEROQUEL) 200 MG tablet Take 100 mg by mouth at bedtime.     No current facility-administered medications on file prior to visit.     PAST MEDICAL HISTORY: Past Medical History:  Diagnosis Date  . Allergy    Zyrtec, Allegra.  . Anxiety    followed by Dr. Toy Cookey  . Back pain   . Bipolar 1 disorder ([place])   . Complication of anesthesia   . Depression   .  Fibroid   . Gallbladder problem   . Gastroparesis 09/14/2012   gastric emptying study in 2014  . GERD (gastroesophageal reflux disease)   . Pneumonia    2013ish  . PONV (postoperative nausea and vomiting)   . Pre-diabetes   . PTSD (post-traumatic stress disorder)   . Vitamin D deficiency     PAST SURGICAL HISTORY: Past Surgical History:  Procedure Laterality Date  . CESAREAN SECTION    . CHOLECYSTECTOMY    . DILATION AND CURETTAGE OF UTERUS    . FOREIGN BODY REMOVAL Left 11/18/2016   Procedure: REMOVAL FOREIGN BODY EXTREMITY LEFT FOOT;  Surgeon: Trula Slade, DPM;  Location: [facility];  Service: Podiatry;  Laterality: Left;  .  PILONIDAL CYST EXCISION  1990's  . TENDON REPAIR Left    Left Ankle  . WISDOM TOOTH EXTRACTION  19090's    SOCIAL HISTORY: Social History   Tobacco Use  . Smoking status: Former Smoker    Years: 10.00    Types: Cigarettes    Last attempt to quit: 2003    Years since quitting: 16.4  . Smokeless tobacco: Never Used  Substance Use Topics  . Alcohol use: Yes    Alcohol/week: 0.0 oz    Comment: 1-2 per month   . Drug use: No    FAMILY HISTORY: Family History  Problem Relation Age of Onset  . Cancer Mother        squamous cell carcinoma  . Hyperlipidemia Mother   . Depression Mother   . Anxiety disorder Mother   . Obesity Mother   . Heart disease Father 50       cardiomegaly, CHF; steroid use.  [name] Hyperlipidemia Father   . Hypertension Father   . Mental retardation Father   . High blood pressure Father   . Depression Father   . Anxiety disorder Father   . Obesity Father   . Cancer Maternal Grandmother   . Diabetes Maternal Grandmother   . Heart disease Maternal Grandmother   . Hyperlipidemia Maternal Grandmother   . Hypertension Maternal Grandmother   . Stroke Maternal Grandmother   . Heart disease Paternal Grandmother   . Hypertension Paternal Grandmother   . Heart disease Paternal Grandfather   . Hyperlipidemia Paternal Grandfather   . Mental illness Paternal Grandfather   . Rheum arthritis Sister   . Breast cancer Maternal Aunt   . Thyroid cancer Paternal Aunt     ROS: Review of Systems  Constitutional: Positive for malaise/fatigue and weight loss.  Gastrointestinal: Negative for nausea and vomiting.  Genitourinary: Negative for frequency.  Musculoskeletal:       Negative muscle weakness  Endo/Heme/Allergies: Negative for polydipsia.       Negative hypoglycemia    PHYSICAL EXAM: Blood pressure 135/77, pulse 85, temperature 98.1 F (36.7 C), temperature source Oral, height 5\' 5"  (1.651 m), weight 260 lb (117.9 kg), SpO2 96 %. Body mass index is 43.27  kg/m. Physical Exam  Constitutional: She is oriented to person, place, and time. She appears well-developed and well-nourished.  Cardiovascular: Normal rate.  Pulmonary/Chest: Effort normal.  Musculoskeletal: Normal range of motion.  Neurological: She is oriented to person, place, and time.  Skin: Skin is warm and dry.  Psychiatric: She has a normal mood and affect. Her behavior is normal.  Vitals reviewed.   RECENT LABS AND TESTS: BMET    Component Value Date/Time   NA 138 01/05/2018 0915   NA 141 10/07/2017 1255   K 4.2 01/05/2018 0915  CL 107 01/05/2018 0915   CO2 23 01/05/2018 0915   GLUCOSE 97 01/05/2018 0915   BUN 17 01/05/2018 0915   BUN 18 10/07/2017 1255   CREATININE 0.93 01/05/2018 0915   CREATININE 0.95 06/27/2016 0933   CALCIUM 8.9 01/05/2018 0915   GFRNONAA >60 01/05/2018 0915   GFRAA >60 01/05/2018 0915   Lab Results  Component Value Date   HGBA1C 5.5 10/07/2017   HGBA1C 5.1 06/22/2017   HGBA1C 5.3 01/11/2017   HGBA1C 5.7 (H) 08/31/2016   HGBA1C 5.6 12/01/2015   Lab Results  Component Value Date   INSULIN 15.6 10/07/2017   INSULIN 25.2 (H) 06/22/2017   INSULIN 17.0 01/11/2017   INSULIN 17.4 08/31/2016   CBC    Component Value Date/Time   WBC 5.5 01/05/2018 0915   RBC 4.02 01/05/2018 0915   HGB 11.1 (L) 01/05/2018 0915   HGB 11.2 06/22/2017 0904   HCT 35.4 (L) 01/05/2018 0915   HCT 34.8 06/22/2017 0904   PLT 285 01/05/2018 0915   PLT 332 11/12/2016 1651   MCV 88.1 01/05/2018 0915   MCV 86 06/22/2017 0904   MCH 27.6 01/05/2018 0915   MCHC 31.4 01/05/2018 0915   RDW 14.4 01/05/2018 0915   RDW 15.7 (H) 06/22/2017 0904   LYMPHSABS 1.5 06/22/2017 0904   MONOABS 891 04/30/2016 0947   EOSABS 0.2 06/22/2017 0904   BASOSABS 0.0 06/22/2017 0904   Iron/TIBC/Ferritin/ %Sat    Component Value Date/Time   IRON 53 08/31/2016 1644   TIBC 280 08/31/2016 1644   FERRITIN 36 08/31/2016 1644   IRONPCTSAT 19 08/31/2016 1644   Lipid Panel       Component Value Date/Time   CHOL 162 10/07/2017 1255   TRIG 153 (H) 10/07/2017 1255   HDL 42 10/07/2017 1255   CHOLHDL 4.1 12/01/2015 0914   VLDL 37 (H) 12/01/2015 0914   LDLCALC 89 10/07/2017 1255   Hepatic Function Panel     Component Value Date/Time   PROT 7.9 01/05/2018 0915   PROT 7.3 10/07/2017 1255   ALBUMIN 3.5 01/05/2018 0915   ALBUMIN 4.0 10/07/2017 1255   AST 20 01/05/2018 0915   ALT 18 01/05/2018 0915   ALKPHOS 75 01/05/2018 0915   BILITOT 0.5 01/05/2018 0915   BILITOT <0.2 10/07/2017 1255      Component Value Date/Time   TSH 1.620 08/31/2016 1644   TSH 1.83 12/01/2015 0914    ASSESSMENT AND PLAN: Prediabetes - Plan: metFORMIN (GLUCOPHAGE) 1000 MG tablet, Hemoglobin A1c, Insulin, random, Lipid panel  Vitamin D deficiency - Plan: VITAMIN D 25 Hydroxy (Vit-D Deficiency, Fractures)  At risk for diabetes mellitus  Class 3 severe obesity with serious comorbidity and body mass index (BMI) of 40.0 to 44.9 in adult, unspecified obesity type [facility])  PLAN:  Pre-Diabetes Chany will continue to work on weight loss, exercise, and decreasing simple carbohydrates in her diet to help decrease the risk of diabetes. We dicussed metformin including benefits and risks. She was informed that eating too many simple carbohydrates or too many calories at one sitting increases the likelihood of GI side effects. Allizon agrees to continue taking metformin 1,000 mg PO q AM #30 and we will refill for 1 month. We will check labs and Othello agrees to follow up with our clinic in 2 weeks as directed to monitor her progress.  Diabetes risk counselling Devone was given extended (15 minutes) diabetes prevention counseling today. She is 42 y.o. female and has risk factors for diabetes including obesity and  pre-diabetes. We discussed intensive lifestyle modifications today with an emphasis on weight loss as well as increasing exercise and decreasing simple carbohydrates in her diet.  Vitamin  D Deficiency Mikela was informed that low vitamin D levels contributes to fatigue and are associated with obesity, breast, and colon cancer. She will follow up for routine testing of vitamin D, at least 2-3 times per year. She was informed of the risk of over-replacement of vitamin D and agrees to not increase her dose unless she discusses this with [name] first. We will check labs and Danean agrees to follow up with our clinic in 2 weeks.  Obesity Breunna is currently in the action stage of change. As such, her goal is to continue with weight loss efforts She has agreed to follow the Category 3 plan Telly has been instructed to work up to a goal of 150 minutes of combined cardio and strengthening exercise per week for weight loss and overall health benefits. Resistance training to start next appointment, Season is to start thinking about where this would fit in. We discussed the following Behavioral Modification Strategies today: increasing lean protein intake, increasing vegetables, work on meal planning and easy cooking plans, and planning for success   Emmalina has agreed to follow up with our clinic in 2 weeks. She was informed of the importance of frequent follow up visits to maximize her success with intensive lifestyle modifications for her multiple health conditions.   I, Trixie Dredge, am acting as transcriptionist for Ilene Qua, MD  I have reviewed the above documentation for accuracy and completeness, and I agree with the above. - Ilene Qua, MD

## 2018-02-11 LAB — HEMOGLOBIN A1C
Est. average glucose Bld gHb Est-mCnc: 108 mg/dL
Hgb A1c MFr Bld: 5.4 % (ref 4.8–5.6)

## 2018-02-11 LAB — LIPID PANEL
Chol/HDL Ratio: 3.5 ratio (ref 0.0–4.4)
Cholesterol, Total: 133 mg/dL (ref 100–199)
HDL: 38 mg/dL — ABNORMAL LOW (ref >39)
LDL Calculated: 55 mg/dL (ref 0–99)
Triglycerides: 199 mg/dL — ABNORMAL HIGH (ref 0–149)
VLDL Cholesterol Cal: 40 mg/dL (ref 5–40)

## 2018-02-11 LAB — VITAMIN D 25 HYDROXY (VIT D DEFICIENCY, FRACTURES): Vit D, 25-Hydroxy: 58.5 ng/mL (ref 30.0–100.0)

## 2018-02-11 LAB — INSULIN, RANDOM: INSULIN: 20.3 u[IU]/mL (ref 2.6–24.9)

## 2018-02-16 ENCOUNTER — Encounter (INDEPENDENT_AMBULATORY_CARE_PROVIDER_SITE_OTHER): Payer: Self-pay | Admitting: Family Medicine

## 2018-02-18 DIAGNOSIS — K2980 Duodenitis without bleeding: Secondary | ICD-10-CM | POA: Diagnosis not present

## 2018-02-18 DIAGNOSIS — K219 Gastro-esophageal reflux disease without esophagitis: Secondary | ICD-10-CM | POA: Diagnosis not present

## 2018-02-18 DIAGNOSIS — K449 Diaphragmatic hernia without obstruction or gangrene: Secondary | ICD-10-CM | POA: Diagnosis not present

## 2018-02-18 DIAGNOSIS — K317 Polyp of stomach and duodenum: Secondary | ICD-10-CM | POA: Diagnosis not present

## 2018-02-18 DIAGNOSIS — K228 Other specified diseases of esophagus: Secondary | ICD-10-CM | POA: Diagnosis not present

## 2018-02-18 DIAGNOSIS — K2950 Unspecified chronic gastritis without bleeding: Secondary | ICD-10-CM | POA: Diagnosis not present

## 2018-02-21 MED FILL — clonazePAM 0.5 MG TABS: 0.5 | 30 days supply | Qty: 120 | Fill #0

## 2018-02-21 MED FILL — PROPRANOLOL 40 MG TABLET: 40 | 30 days supply | Qty: 60 | Fill #1

## 2018-02-27 ENCOUNTER — Encounter (HOSPITAL_COMMUNITY): Payer: Self-pay | Admitting: Emergency Medicine

## 2018-02-27 ENCOUNTER — Ambulatory Visit (HOSPITAL_COMMUNITY)
Admission: EM | Admit: 2018-02-27 | Discharge: 2018-02-27 | Disposition: A | Discharge status: Home / Self Care | Payer: 59 | Attending: Family Medicine | Admitting: Family Medicine

## 2018-02-27 ENCOUNTER — Ambulatory Visit (INDEPENDENT_AMBULATORY_CARE_PROVIDER_SITE_OTHER): Payer: 59

## 2018-02-27 DIAGNOSIS — M25572 Pain in left ankle and joints of left foot: Secondary | ICD-10-CM

## 2018-02-27 DIAGNOSIS — S93492A Sprain of other ligament of left ankle, initial encounter: Secondary | ICD-10-CM

## 2018-02-27 DIAGNOSIS — M7989 Other specified soft tissue disorders: Secondary | ICD-10-CM | POA: Diagnosis not present

## 2018-02-27 NOTE — ED Triage Notes (Signed)
Pt states she twisted her R ankle while walking today.

## 2018-02-27 NOTE — Discharge Instructions (Signed)
Wear cam walking boot for first 2 weeks.  Wear at all times when upright. Take boot off at least twice a day and do range of motion exercises with ankle You may graduate to a ankle brace inside a lace up shoe as tolerated Ice for 20 minutes every couple hours for the swelling Ibuprofen 800 mg 3 times a day with food Follow-up with orthopedist if you fail to improve

## 2018-02-27 NOTE — ED Provider Notes (Signed)
[facility]    CSN: [id number] Arrival date & time: 02/27/18  1628     History   Chief Complaint Chief Complaint  Patient presents with  . Ankle Pain    HPI Caroline Gonzales is a 42 y.o. female.   HPI  Patient is here for ankle pain.  She twisted her ankle and fell earlier today.  The ankle rolled under.  It is very painful.  She cannot bear weight on it.  This ankle has caused her much problems in the past.  She is had multiple sprains.  She thinks she had an old fracture.  She has had surgery to repair soft tissue/ligament/tendons.  She did not have any pain prior to her fall.  Past Medical History:  Diagnosis Date  . Allergy    Zyrtec, Allegra.  . Anxiety    followed by Dr. Toy Cookey  . Back pain   . Bipolar 1 disorder ([place])   . Complication of anesthesia   . Depression   . Fibroid   . Gallbladder problem   . Gastroparesis 09/14/2012   gastric emptying study in 2014  . GERD (gastroesophageal reflux disease)   . Pneumonia    2013ish  . PONV (postoperative nausea and vomiting)   . Pre-diabetes   . PTSD (post-traumatic stress disorder)   . Vitamin D deficiency     Patient Active Problem List   Diagnosis Date Noted  . Insulin resistance 10/21/2017  . Other hyperlipidemia 10/07/2017  . Vitamin D deficiency 10/07/2017  . Bipolar depression ([place]) 04/27/2017  . Class 3 obesity with serious comorbidity and body mass index (BMI) of 40.0 to 44.9 in adult 04/27/2017  . Residual foreign body in soft tissue 11/12/2016  . Prediabetes 11/02/2016  . Morbid obesity ([place]) 10/15/2016  . Bipolar 1 disorder [facility])     Past Surgical History:  Procedure Laterality Date  . CESAREAN SECTION    . CHOLECYSTECTOMY    . DILATION AND CURETTAGE OF UTERUS    . FOREIGN BODY REMOVAL Left 11/18/2016   Procedure: REMOVAL FOREIGN BODY EXTREMITY LEFT FOOT;  Surgeon: Trula Slade, DPM;  Location: [facility];  Service: Podiatry;  Laterality: Left;  . PILONIDAL CYST EXCISION  1990's  .  TENDON REPAIR Left    Left Ankle  . WISDOM TOOTH EXTRACTION  19090's    OB History    Gravida  3   Para  3   Term  3   Preterm      AB      Living  3     SAB      TAB      Ectopic      Multiple      Live Births  3            Home Medications    Prior to Admission medications   Medication Sig Start Date End Date Taking? Authorizing Provider  Cholecalciferol (VITAMIN D3) 5000 units CAPS Take 5,000 Units by mouth at bedtime.    [provider]  clonazePAM (KLONOPIN) 0.5 MG tablet Take 1.5 mg by mouth at bedtime.    [provider]  clonazePAM (KLONOPIN) 1 MG tablet Take 3 mg by mouth 2 (two) times daily as needed for anxiety. Taking 0.5mg  1-2 tabs BID prn    [provider]  L-methylfolate Calcium 15 MG TABS Take 15 mg by mouth at bedtime.     [provider]  lamoTRIgine (LAMICTAL) 200 MG tablet Take 200 mg  by mouth at bedtime.    [provider]  levonorgestrel (MIRENA) 20 MCG/24HR IUD 1 each by Intrauterine route once. Place 03/2013    [provider]  loratadine (CLARITIN) 10 MG tablet Take 10 mg by mouth at bedtime.     [provider]  metFORMIN (GLUCOPHAGE) 1000 MG tablet Take 1 tablet (1,000 mg total) by mouth daily with breakfast. 02/09/18   Eber Jones, MD  Multiple Vitamin (MULTIVITAMIN) tablet Take 1 tablet by mouth at bedtime.     [provider]  pantoprazole (PROTONIX) 40 MG tablet Take 40 mg by mouth daily.    [provider]  propranolol (INDERAL) 40 MG tablet Take 40 mg by mouth 2 (two) times daily.     [provider]  QUEtiapine (SEROQUEL) 200 MG tablet Take 100 mg by mouth at bedtime.    [provider]    Family History Family History  Problem Relation Age of Onset  . Cancer Mother        squamous cell carcinoma  . Hyperlipidemia Mother   . Depression Mother   . Anxiety disorder Mother   . Obesity Mother   . Heart disease Father  45       cardiomegaly, CHF; steroid use.  [name] Hyperlipidemia Father   . Hypertension Father   . Mental retardation Father   . High blood pressure Father   . Depression Father   . Anxiety disorder Father   . Obesity Father   . Cancer Maternal Grandmother   . Diabetes Maternal Grandmother   . Heart disease Maternal Grandmother   . Hyperlipidemia Maternal Grandmother   . Hypertension Maternal Grandmother   . Stroke Maternal Grandmother   . Heart disease Paternal Grandmother   . Hypertension Paternal Grandmother   . Heart disease Paternal Grandfather   . Hyperlipidemia Paternal Grandfather   . Mental illness Paternal Grandfather   . Rheum arthritis Sister   . Breast cancer Maternal Aunt   . Thyroid cancer Paternal Aunt     Social History Social History   Tobacco Use  . Smoking status: Former Smoker    Years: 10.00    Types: Cigarettes    Last attempt to quit: 2003    Years since quitting: 16.4  . Smokeless tobacco: Never Used  Substance Use Topics  . Alcohol use: Yes    Alcohol/week: 0.0 oz    Comment: 1-2 per month   . Drug use: No     Allergies   Penicillins; Tamiflu [oseltamivir phosphate]; Cephalosporins; and Latex   Review of Systems Review of Systems  Constitutional: Negative for chills and fever.  HENT: Negative for ear pain and sore throat.   Eyes: Negative for pain and visual disturbance.  Respiratory: Negative for cough and shortness of breath.   Cardiovascular: Negative for chest pain and palpitations.  Gastrointestinal: Negative for abdominal pain and vomiting.  Genitourinary: Negative for dysuria and hematuria.  Musculoskeletal: Positive for arthralgias and gait problem. Negative for back pain.  Skin: Negative for color change and rash.  Neurological: Negative for seizures and syncope.  All other systems reviewed and are negative.    Physical Exam Triage Vital Signs ED Triage Vitals  Enc Vitals Group     BP 02/27/18 1637 126/60     Pulse Rate  02/27/18 1636 95     Resp 02/27/18 1636 16     Temp 02/27/18 1636 97.9 F (36.6 C)     Temp src --  SpO2 02/27/18 1636 100 %     Weight --      Height --      Head Circumference --      Peak Flow --      Pain Score 02/27/18 1636 8     Pain Loc --      Pain Edu? --      Excl. in [place]? --    No data found.  Updated Vital Signs BP 126/60   Pulse 95   Temp 97.9 F (36.6 C)   Resp 16   SpO2 100%   Visual Acuity Right Eye Distance:   Left Eye Distance:   Bilateral Distance:    Right Eye Near:   Left Eye Near:    Bilateral Near:     Physical Exam  Constitutional: She appears well-developed and well-nourished. No distress.  HENT:  Head: Normocephalic and atraumatic.  Mouth/Throat: Oropharynx is clear and moist.  Eyes: Pupils are equal, round, and reactive to light. Conjunctivae are normal.  Neck: Normal range of motion.  Cardiovascular: Normal rate.  Pulmonary/Chest: Effort normal. No respiratory distress.  Abdominal: Soft. She exhibits no distension.  Musculoskeletal: Normal range of motion. She exhibits no edema.  Patient examined in wheelchair.  Left ankle has moderate soft tissue swelling laterally.  Tender over the distal fibula.  Limited extension more than flexion.  Pain with inversion and eversion.  No instability to exam.  X-rays negative  Neurological: She is alert.  Skin: Skin is warm and dry.  Psychiatric: She has a normal mood and affect. Her behavior is normal.     UC Treatments / Results  Labs (all labs ordered are listed, but only abnormal results are displayed) Labs Reviewed - No data to display  EKG None  Radiology Dg Ankle Complete Left  Result Date: 02/27/2018 CLINICAL DATA:  Rolled left ankle when stepping off curb. Pain with weight-bearing. EXAM: LEFT ANKLE COMPLETE - 3+ VIEW COMPARISON:  None FINDINGS: Mild lateral soft tissue swelling. No underlying fracture or dislocation identified. Posterior and plantar calcaneal heel spurs  identified. IMPRESSION: 1. No acute bone abnormality. 2. Lateral soft tissue swelling. Electronically Signed   By: Kerby Moors M.D.   On: 02/27/2018 16:59    Procedures Procedures (including critical care time)  Medications Ordered in UC Medications - No data to display  Initial Impression / Assessment and Plan / UC Course  I have reviewed the triage vital signs and the nursing notes.  Pertinent labs & imaging results that were available during my care of the patient were reviewed by me and considered in my medical decision making (see chart for details).     Discussed ankle sprain.  Damaged ankle ligaments can take 6 to 8 weeks to heal.  Because she is unable to bear weight, I am going to put her into a cam walker at first.  She can graduate into a brace in a couple weeks as tolerated.  She has had ankle sprains before and knows the management.  Patient is an Therapist, sports at the hospital.  She is going to modify her work so she can continue her duties.  She is advised to follow-up with orthopedic if she fails to improve. Final Clinical Impressions(s) / UC Diagnoses   Final diagnoses:  Acute left ankle pain  Sprain of anterior talofibular ligament of left ankle, initial encounter     Discharge Instructions     Wear cam walking boot for first 2 weeks.  Wear at all times  when upright. Take boot off at least twice a day and do range of motion exercises with ankle You may graduate to a ankle brace inside a lace up shoe as tolerated Ice for 20 minutes every couple hours for the swelling Ibuprofen 800 mg 3 times a day with food Follow-up with orthopedist if you fail to improve   ED Prescriptions    None     Controlled Substance Prescriptions [place] Controlled Substance Registry consulted? Not Applicable   Raylene Everts, MD 02/27/18 1729

## 2018-03-01 DIAGNOSIS — F4322 Adjustment disorder with anxiety: Secondary | ICD-10-CM | POA: Diagnosis not present

## 2018-03-02 ENCOUNTER — Encounter (INDEPENDENT_AMBULATORY_CARE_PROVIDER_SITE_OTHER): Payer: Self-pay

## 2018-03-02 ENCOUNTER — Ambulatory Visit (INDEPENDENT_AMBULATORY_CARE_PROVIDER_SITE_OTHER): Payer: 59 | Admitting: Family Medicine

## 2018-03-08 MED FILL — PANTOPRAZOLE SOD DR 40 MG T: 40 | 30 days supply | Qty: 30 | Fill #3

## 2018-03-10 ENCOUNTER — Ambulatory Visit: Payer: 59 | Admitting: Family Medicine

## 2018-03-10 ENCOUNTER — Ambulatory Visit (INDEPENDENT_AMBULATORY_CARE_PROVIDER_SITE_OTHER): Payer: 59

## 2018-03-10 ENCOUNTER — Encounter: Payer: Self-pay | Admitting: Family Medicine

## 2018-03-10 VITALS — BP 122/84 | HR 87 | Temp 98.1°F | Resp 16 | Ht 66.0 in | Wt 267.0 lb

## 2018-03-10 DIAGNOSIS — S99912A Unspecified injury of left ankle, initial encounter: Secondary | ICD-10-CM | POA: Diagnosis not present

## 2018-03-10 DIAGNOSIS — M25572 Pain in left ankle and joints of left foot: Secondary | ICD-10-CM

## 2018-03-10 DIAGNOSIS — S93492A Sprain of other ligament of left ankle, initial encounter: Secondary | ICD-10-CM | POA: Diagnosis not present

## 2018-03-10 DIAGNOSIS — M7732 Calcaneal spur, left foot: Secondary | ICD-10-CM | POA: Diagnosis not present

## 2018-03-10 MED ORDER — HYDROCODONE-ACETAMINOPHEN 5-325 MG PO TABS: 1.0000 | ORAL_TABLET | Freq: Four times a day (QID) | ORAL | 0 refills | Status: DC | PRN

## 2018-03-10 MED ORDER — DICLOFENAC SODIUM 1 % TD GEL: 4.0000 g | Freq: Four times a day (QID) | TRANSDERMAL | 11 refills | Status: DC

## 2018-03-10 MED FILL — HYDROCODON-APAP 5-325: 5-325 | 3 days supply | Qty: 10 | Fill #0

## 2018-03-10 MED FILL — DICLOFENAC SODIUM 1% GEL: 1 | 7 days supply | Qty: 100 | Fill #0

## 2018-03-10 NOTE — Patient Instructions (Signed)
Thank you for coming in today. Attend PT.  Continue the brace.  Consider a knee walker with long distances.  I think [facility] does them.  Use the diclofenac gel.  Continue oral NSAIDs as needed.  Advance activity as tolerated.   Recheck in 2 weeks.    Ankle Sprain, Phase I Rehab Ask your health care provider which exercises are safe for you. Do exercises exactly as told by your health care provider and adjust them as directed. It is normal to feel mild stretching, pulling, tightness, or discomfort as you do these exercises, but you should stop right away if you feel sudden pain or your pain gets worse.Do not begin these exercises until told by your health care provider. Stretching and range of motion exercises These exercises warm up your muscles and joints and improve the movement and flexibility of your lower leg and ankle. These exercises also help to relieve pain and stiffness. Exercise A: Gastroc and soleus stretch  1. Sit on the floor with your left / right leg extended. 2. Loop a belt or towel around the ball of your left / right foot. The ball of your foot is on the walking surface, right under your toes. 3. Keep your left / right ankle and foot relaxed and keep your knee straight while you use the belt or towel to pull your foot toward you. You should feel a gentle stretch behind your calf or knee. 4. Hold this position for __________ seconds, then release to the starting position. Repeat the exercise with your knee bent. You can put a pillow or a rolled bath towel under your knee to support it. You should feel a stretch deep in your calf or at your Achilles tendon. Repeat each stretch __________ times. Complete these stretches __________ times a day. Exercise B: Ankle alphabet  1. Sit with your left / right leg supported at the lower leg. ? Do not rest your foot on anything. ? Make sure your foot has room to move freely. 2. Think of your left / right foot as a  paintbrush, and move your foot to trace each letter of the alphabet in the air. Keep your hip and knee still while you trace. Make the letters as large as you can without feeling discomfort. 3. Trace every letter from A to Z. Repeat __________ times. Complete this exercise __________ times a day. Strengthening exercises These exercises build strength and endurance in your ankle and lower leg. Endurance is the ability to use your muscles for a long time, even after they get tired. Exercise C: Dorsiflexors  1. Secure a rubber exercise band or tube to an object, such as a table leg, that will stay still when the band is pulled. Secure the other end around your left / right foot. 2. Sit on the floor facing the object, with your left / right leg extended. The band or tube should be slightly tense when your foot is relaxed. 3. Slowly bring your foot toward you, pulling the band tighter. 4. Hold this position for __________ seconds. 5. Slowly return your foot to the starting position. Repeat __________ times. Complete this exercise __________ times a day. Exercise D: Plantar flexors  1. Sit on the floor with your left / right leg extended. 2. Loop a rubber exercise tube or band around the ball of your left / right foot. The ball of your foot is on the walking surface, right under your toes. ? Hold the ends of the  band or tube in your hands. ? The band or tube should be slightly tense when your foot is relaxed. 3. Slowly point your foot and toes downward, pushing them away from you. 4. Hold this position for __________ seconds. 5. Slowly return your foot to the starting position. Repeat __________ times. Complete this exercise __________ times a day. Exercise E: Evertors 1. Sit on the floor with your legs straight out in front of you. 2. Loop a rubber exercise band or tube around the ball of your left / right foot. The ball of your foot is on the walking surface, right under your toes. ? Hold the  ends of the band in your hands, or secure the band to a stable object. ? The band or tube should be slightly tense when your foot is relaxed. 3. Slowly push your foot outward, away from your other leg. 4. Hold this position for __________ seconds. 5. Slowly return your foot to the starting position. Repeat __________ times. Complete this exercise __________ times a day. This information is not intended to replace advice given to you by your health care provider. Make sure you discuss any questions you have with your health care provider. Document Released: 04/01/2005 Document Revised: 05/07/2016 Document Reviewed: 07/15/2015 Elsevier Interactive Patient Education  2018 [employer].

## 2018-03-10 NOTE — Progress Notes (Signed)
Subjective:     CC: Left Ankle Sprain  HPI: Patient suffered an inversion injury to her left ankle on June 16.  She was seen in the emergency department where she was thought to have an ankle sprain.  She was treated with a Cam walker boot which has been helpful.  She has a pertinent past orthopedic and surgical history for repeated left ankle sprains with history of surgical correction to repair soft tissue ligaments and tendons 2011.  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  She notes that she is getting some soreness in her leg and hips from the Cam walker boot.  She has an ASO brace that she is been trying to use a bed at home and notes that her pain is typically about as well controlled in the ASO brace as it is in the Cam walker boot.  She denies any radiating pain weakness or numbness fevers or chills.  She is been using meloxicam and ibuprofen for pain which have helped a bit.  She notes she can sometimes have a pretty significant soreness in the evening.  Past medical history, Surgical history, Family history not pertinant except as noted below, Social history, Allergies, and medications have been entered into the medical record, reviewed, and no changes needed.   Review of Systems: No headache, visual changes, nausea, vomiting, diarrhea, constipation, dizziness, abdominal pain, skin rash, fevers, chills, night sweats, weight loss, swollen lymph nodes, body aches, joint swelling, muscle aches, chest pain, shortness of breath, mood changes, visual or auditory hallucinations.   Objective:    Vitals:   03/10/18 1431  BP: 122/84  Pulse: 87  Resp: 16  Temp: 98.1 F (36.7 C)  SpO2: 99%   Wt Readings from Last 15 Encounters:  03/10/18 267 lb (121.1 kg)  02/09/18 260 lb (117.9 kg)  02/02/18 261 lb (118.4 kg)  01/20/18 267 lb (121.1 kg)  01/05/18 266 lb (120.7 kg)  12/15/17 262 lb (118.8 kg)  11/30/17 262 lb  (118.8 kg)  11/25/17 260 lb (117.9 kg)  11/11/17 255 lb (115.7 kg)  10/28/17 258 lb (117 kg)  10/21/17 257 lb (116.6 kg)  10/07/17 261 lb (118.4 kg)  09/22/17 258 lb (117 kg)  08/25/17 263 lb (119.3 kg)  08/02/17 258 lb (117 kg)    General: Well Developed, well nourished, and in no acute distress.  Neuro/Psych: Alert and oriented x3, extra-ocular muscles intact, able to move all 4 extremities, sensation grossly intact. Skin: Warm and dry, no rashes noted.  Respiratory: Not using accessory muscles, speaking in full sentences, trachea midline.  Cardiovascular: Pulses palpable, no extremity edema. Abdomen: Does not appear distended. MSK:  Left ankle mildly swollen otherwise normal-appearing with no erythema or ecchymosis. Tender to palpation ATFL area.  Nontender proximal fibular head, medial malleolus, calcaneus, forefoot. Pulses capillary fill and sensation are intact. Negative anterior drawer test.  Mildly positive talar tilt test. Intact strength to inversion eversion dorsiflexion and plantarflexion. Antalgic gait with ASO brace present   Lab and Radiology Results X-ray left ankle images independently personally reviewed. Degenerative changes present in the ankle but no acute fracture visible.  No deformity present. Awaiting for radiology review  Impression and Recommendations:    Assessment and Plan: 42 y.o. female with  Left ankle sprain.  Plan to advance immobilization to ASO brace.  Return to [facility] as needed.  Recommend knee scooter for long distance travel in  the hospital.  Refer to physical therapy for rehabilitation and continue home exercise program.  Continue ice rest and oral NSAIDs.  Add topical diclofenac gel which may help as well.  Limited Norco prescribed.  Cautioned against using Klonopin and Norco at the same time.   Patient researched [facility] Controlled Substance Reporting System.   Recheck in 2 weeks., .   Orders Placed This Encounter    Procedures  . DG Ankle Complete Left    Standing Status:   Future    Standing Expiration Date:   05/11/2019    Order Specific Question:   Reason for Exam (SYMPTOM  OR DIAGNOSIS REQUIRED)    Answer:   eval ankle injury    Order Specific Question:   Is patient pregnant?    Answer:   No    Order Specific Question:   Preferred imaging location?    Answer:   [facility]    Order Specific Question:   Radiology Contrast Protocol - do NOT remove file path    Answer:   [email or web address]   No orders of the defined types were placed in this encounter.   Discussed warning signs or symptoms. Please see discharge instructions. Patient expresses understanding.

## 2018-03-21 MED FILL — QUETIAPINE FUMARATE 200 MG: 200 | 90 days supply | Qty: 90 | Fill #0

## 2018-03-21 MED FILL — clonazePAM 0.5 MG TABS: 0.5 | 30 days supply | Qty: 120 | Fill #1

## 2018-03-23 MED FILL — PROPRANOLOL 40 MG TABLET: 40 | 90 days supply | Qty: 180 | Fill #0

## 2018-03-23 NOTE — Progress Notes (Signed)
42 y.o. [id number] MarriedCaucasianF here for annual exam as well as Mirena IUD removal and reinsertion. No bleeding with her IUD. She reports occasional vaginismus for the last few years, not painful, occurs 1-2 x a month only just prior to having an orgasm. No dyspareunia. Tolerable.     No LMP recorded. (Menstrual status: IUD).          Sexually active: Yes.    The current method of family planning is IUD.    Exercising: No.  The patient does not participate in regular exercise at present. Smoker:  no  Health Maintenance: Pap:  12-12-15 WNL NEG HR HPV History of abnormal Pap:  no MMG:  01-02-16 WNL  Colonoscopy:  Never BMD:   Never TDaP:  2017 Gardasil: Never, we discussed the option.     reports that she quit smoking about 16 years ago. Her smoking use included cigarettes. She quit after 10.00 years of use. She has never used smokeless tobacco. She reports that she drinks alcohol. She reports that she does not use drugs. Rare ETOH. 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 She is working full time. 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    Past Medical History:  Diagnosis Date  . Allergy    Zyrtec, Allegra.  . Anxiety    followed by Dr. Toy Cookey  . Back pain   . Bipolar 1 disorder ([place])   . Complication of anesthesia   . Depression   . Fibroid   . Gallbladder problem   . Gastroparesis 09/14/2012   gastric emptying study in 2014  . GERD (gastroesophageal reflux disease)   . Pneumonia    2013ish  . PONV (postoperative nausea and vomiting)   . Pre-diabetes   . PTSD (post-traumatic stress disorder)   . Vitamin D deficiency     Past Surgical History:  Procedure Laterality Date  . ANKLE ARTHROSCOPY Left 2011  . CESAREAN SECTION    . CHOLECYSTECTOMY    . DILATION AND CURETTAGE OF UTERUS    . FOREIGN BODY REMOVAL Left 11/18/2016   Procedure: REMOVAL FOREIGN BODY EXTREMITY LEFT FOOT;  Surgeon: Trula Slade, DPM;  Location: [facility];   Service: Podiatry;  Laterality: Left;  . PILONIDAL CYST EXCISION  1990's  . TENDON REPAIR Left 2011   Left Ankle  . WISDOM TOOTH EXTRACTION  19090's    Current Outpatient Medications  Medication Sig Dispense Refill  . Cholecalciferol (VITAMIN D3) 5000 units CAPS Take 5,000 Units by mouth at bedtime.    . clonazePAM (KLONOPIN) 0.5 MG tablet Take 1.5 mg by mouth at bedtime.    . diclofenac sodium (VOLTAREN) 1 % GEL Apply 4 g topically 4 (four) times daily. To affected joint. 100 g 11  . HYDROcodone-acetaminophen (NORCO/VICODIN) 5-325 MG tablet Take 1 tablet by mouth every 6 (six) hours as needed. 10 tablet 0  . L-methylfolate Calcium 15 MG TABS Take 15 mg by mouth at bedtime.     . lamoTRIgine (LAMICTAL) 200 MG tablet Take 200 mg by mouth at bedtime.    [name] levonorgestrel (MIRENA) 20 MCG/24HR IUD 1 each by Intrauterine route once. Place 03/2013    . loratadine (CLARITIN) 10 MG tablet Take 10 mg by mouth at bedtime.     . metFORMIN (GLUCOPHAGE) 1000 MG tablet Take 1 tablet (1,000 mg total) by mouth daily with breakfast. 30 tablet 0  . Multiple Vitamin (MULTIVITAMIN) tablet Take 1 tablet by mouth at bedtime.     [name]  pantoprazole (PROTONIX) 40 MG tablet Take 40 mg by mouth daily.    . propranolol (INDERAL) 40 MG tablet Take 40 mg by mouth 2 (two) times daily.     . QUEtiapine (SEROQUEL) 200 MG tablet Take 100 mg by mouth at bedtime.     No current facility-administered medications for this visit.     Family History  Problem Relation Age of Onset  . Cancer Mother        squamous cell carcinoma  . Hyperlipidemia Mother   . Depression Mother   . Anxiety disorder Mother   . Obesity Mother   . Heart disease Father 17       cardiomegaly, CHF; steroid use.  [name] Hyperlipidemia Father   . Hypertension Father   . Mental retardation Father   . High blood pressure Father   . Depression Father   . Anxiety disorder Father   . Obesity Father   . Cancer Maternal Grandmother   . Diabetes Maternal  Grandmother   . Heart disease Maternal Grandmother   . Hyperlipidemia Maternal Grandmother   . Hypertension Maternal Grandmother   . Stroke Maternal Grandmother   . Heart disease Paternal Grandmother   . Hypertension Paternal Grandmother   . Heart disease Paternal Grandfather   . Hyperlipidemia Paternal Grandfather   . Mental illness Paternal Grandfather   . Rheum arthritis Sister   . Breast cancer Maternal Aunt   . Thyroid cancer Paternal Aunt     Review of Systems  Constitutional: Negative.   HENT: Negative.   Eyes: Negative.   Respiratory: Negative.   Cardiovascular: Negative.   Gastrointestinal: Negative.   Endocrine: Negative.   Genitourinary: Negative.   Musculoskeletal: Negative.   Skin: Negative.   Allergic/Immunologic: Negative.   Neurological: Negative.   Hematological: Negative.   Psychiatric/Behavioral: Negative.     Exam:   BP 121/80 (BP Location: Right Arm, Patient Position: Sitting)   Pulse 80   Ht 5' 4.57" (1.64 m)   Wt 265 lb 12.8 oz (120.6 kg)   BMI 44.83 kg/m   Weight change: @WEIGHTCHANGE @ Height:   Height: 5' 4.57" (164 cm)  Ht Readings from Last 3 Encounters:  03/24/18 5' 4.57" (1.64 m)  03/24/18 5\' 6"  (1.676 m)  03/10/18 5\' 6"  (1.676 m)    General appearance: alert, cooperative and appears stated age Head: Normocephalic, without obvious abnormality, atraumatic Neck: no adenopathy, supple, symmetrical, trachea midline and thyroid normal to inspection and palpation Lungs: clear to auscultation bilaterally Cardiovascular: regular rate and rhythm Breasts: normal appearance, no masses or tenderness Abdomen: soft, non-tender; non distended,  no masses,  no organomegaly Extremities: extremities normal, atraumatic, no cyanosis or edema Skin: Skin color, texture, turgor normal. No rashes or lesions Lymph nodes: Cervical, supraclavicular, and axillary nodes normal. No abnormal inguinal nodes palpated Neurologic: Grossly normal   Pelvic: External  genitalia:  no lesions              Urethra:  normal appearing urethra with no masses, tenderness or lesions              Bartholins and Skenes: normal                 Vagina: normal appearing vagina with normal color and discharge, no lesions              Cervix: no lesions               Bimanual Exam:  Uterus:  normal size, contour, position, consistency,  mobility, non-tender              Adnexa: no mass, fullness, tenderness               Rectovaginal: Confirms               Anus:  normal sphincter tone, no lesions  The risks of the mirena IUD were reviewed with the patient, including infection, abnormal bleeding and uterine perfortion. Consent was signed.  A speculum was placed in the vagina, the old IUD was removed with ringed forceps and the cervix was cleansed with betadine. A tenaculum was placed on the cervix, the uterus sounded to 8-9 cm. The cervix was dilated to a 5 hagar dilator  The mirena IUD was inserted without difficulty. The string were cut to 3-4 cm. The tenaculum was removed. Slight oozing from the tenaculum site was stopped with pressure.   The patient tolerated the procedure well.   Chaperone was present for exam.  A:  Well Woman with normal exam  mirena removal and reinsertion  P:   Labs with primary  Mammogram (she will schedule)  Pap next year  Discussed breast self exam  Discussed calcium and vit D intake  F/U in one month

## 2018-03-24 ENCOUNTER — Ambulatory Visit: Payer: 59 | Admitting: Family Medicine

## 2018-03-24 ENCOUNTER — Encounter: Payer: Self-pay | Admitting: Family Medicine

## 2018-03-24 ENCOUNTER — Ambulatory Visit (INDEPENDENT_AMBULATORY_CARE_PROVIDER_SITE_OTHER): Payer: 59 | Admitting: Obstetrics and Gynecology

## 2018-03-24 ENCOUNTER — Other Ambulatory Visit: Payer: Self-pay

## 2018-03-24 ENCOUNTER — Encounter: Payer: Self-pay | Admitting: Obstetrics and Gynecology

## 2018-03-24 VITALS — BP 127/84 | HR 87 | Ht 66.0 in | Wt 266.0 lb

## 2018-03-24 VITALS — BP 121/80 | HR 80 | Ht 64.57 in | Wt 265.8 lb

## 2018-03-24 DIAGNOSIS — M25572 Pain in left ankle and joints of left foot: Secondary | ICD-10-CM

## 2018-03-24 DIAGNOSIS — Z01419 Encounter for gynecological examination (general) (routine) without abnormal findings: Secondary | ICD-10-CM | POA: Diagnosis not present

## 2018-03-24 DIAGNOSIS — Z30433 Encounter for removal and reinsertion of intrauterine contraceptive device: Secondary | ICD-10-CM | POA: Diagnosis not present

## 2018-03-24 NOTE — Patient Instructions (Signed)
Thank you for coming in today. Continue home exercises.  Use the ankle compression sleeve as needed.  Consider ankle brace especially if you are on unstable terrain.  Recheck with me as needed.    Ankle Sprain, Phase II Rehab Ask your health care provider which exercises are safe for you. Do exercises exactly as told by your health care provider and adjust them as directed. It is normal to feel mild stretching, pulling, tightness, or discomfort as you do these exercises, but you should stop right away if you feel sudden pain or your pain gets worse.Do not begin these exercises until told by your health care provider. Stretching and range of motion exercises These exercises warm up your muscles and joints and improve the movement and flexibility of your lower leg and ankle. These exercises also help to relieve pain and stiffness. Exercise A: Gastroc stretch, standing  1. Stand with your hands against a wall. 2. Extend your left / right leg behind you, and bend your front knee slightly. Your heels should be on the floor. 3. Keeping your heels on the floor and your back knee straight, shift your weight toward the wall. You should feel a gentle stretch in the back of your lower leg (calf). 4. Hold this position for __________ seconds. Repeat __________ times. Complete this exercise __________ times a day. Exercise B: Soleus stretch, standing 1. Stand with your hands against a wall. 2. Extend your left / right leg behind you, and bend your front knee slightly. Both of your heels should be on the floor. 3. Keeping your heels on the floor, bend your back knee and shift your weight slightly over your back leg. You should feel a gentle stretch deep in your calf. 4. Hold this position for __________ seconds. Repeat __________ times. Complete this exercise __________ times a day. Strengthening exercises These exercises build strength and endurance in your lower leg. Endurance is the ability to use  your muscles for a long time, even after they get tired. Exercise C: Heel walking ( dorsiflexion) Walk on your heels for __________ seconds or ___________ ft. Keep your toes as high as possible. Repeat __________ times. Complete this exercise __________ times a day. Balance exercises These exercises improve your balance and the reaction and control of your ankle to help improve stability. Exercise D: Multi-angle lunge 1. Stand with your feet together. 2. Take a step forward with your left / right leg, and shift your weight onto that leg. Your back heel will come off the floor, and your back toes will stay in place. 3. Push off your front leg to return your front foot to the starting position next to your other foot. 4. Repeat to the side, to the back, and any other directions as told by your health care provider. Repeat in each direction __________ times. Complete this exercise __________ times a day. Exercise E: Single leg stand 1. Without shoes, stand near a railing or in a door frame. Hold onto the railing or door frame as needed. 2. Stand on your left / right foot. Keep your big toe down on the floor and try to keep your arch lifted. 3. Hold this position for __________ seconds. Repeat __________ times. Complete this exercise __________ times a day. If this exercise is too easy, you can try it with your eyes closed or while standing on a pillow. Exercise F: Inversion/eversion  You will need a balance board for this exercise. Ask your health care provider where you can  get a balance board or how you can make one. 1. Stand on a non-carpeted surface near a countertop or wall. 2. Step onto the balance board so your feet are hip-width apart. 3. Keep your feet in place and keep your upper body and hips steady. Using only your feet and ankles to move the board, do one or both of the following exercises as told by your health care provider: ? Tip the board side to side as far as you can,  alternating between tipping to the left and tipping to the right. If you can, tip the board so it silently taps the floor. Do not let the board forcefully hit the floor. From time to time, pause to hold a steady position. ? Tip the board side to side so the board does not hit the floor at all. From time to time, pause to hold a steady position. Repeat the movement for each exercise __________ times. Complete each exercise __________ times a day. Exercise G: Plantar flexion/dorsiflexion  You will need a balance board for this exercise. Ask your health care provider where you can get a balance board or how you can make one. 1. Stand on a non-carpeted surface near a countertop or wall. 2. Step onto the balance board so your feet are hip-width apart. 3. Keep your feet in place and keep your upper body and hips steady. Using only your feet and ankles to move the board, do one or both of the following exercises as told by your health care provider: ? Tip the board forward and backward so the board silently taps the floor. Do not let the board forcefully hit the floor. From time to time, pause to hold a steady position. ? Tip the board forward and backward so the board does not hit the floor at all. From time to time, pause to hold a steady position. Repeat the movement for each exercise __________ times. Complete each exercise __________ times a day. This information is not intended to replace advice given to you by your health care provider. Make sure you discuss any questions you have with your health care provider. Document Released: 12/21/2005 Document Revised: 05/07/2016 Document Reviewed: 07/15/2015 Elsevier Interactive Patient Education  2018 [employer].

## 2018-03-24 NOTE — Progress Notes (Signed)
Caroline Gonzales is a 42 y.o. female who presents to [facility] today for follow up left ankle sprain.   Caroline Gonzales was seen on 03/10/18 after suffering an inversion injury on 02/28/18.  X-rays initially did not show any fractures and subsequent x-ray on June 27 were somewhat concerning for a possible distal fibula fracture.  However in the interval Caroline Gonzales has done extremely well.  She notes that her pain has almost completely resolved.  She was able to transition out of the Cam walker boot and is able to walk around the hospital without much pain.  She does not have to use the brace much at all.  She notes that she is been doing her home exercise program and is doing quite well.  Since she is doing so well she is not sure that she needs to go to physical therapy and has yet made an appointment.  She uses diclofenac gel intermittently which helps a lot.    ROS:  As above  Exam:  BP 127/84   Pulse 87   Ht 5\' 6"  (1.676 m)   Wt 266 lb (120.7 kg)   BMI 42.93 kg/m  General: Well Developed, well nourished, and in no acute distress.  Neuro/Psych: Alert and oriented x3, extra-ocular muscles intact, able to move all 4 extremities, sensation grossly intact. Skin: Warm and dry, no rashes noted.  Respiratory: Not using accessory muscles, speaking in full sentences, trachea midline.  Cardiovascular: Pulses palpable, no extremity edema. Abdomen: Does not appear distended. MSK:  Left ankle minimal swelling. Minimally tender distal fibula. Normal motion.  Stable ligamentous exam.  Intact strength.      Assessment and Plan: 42 y.o. female with left ankle sprain.  Significantly improved.  Given the near complete resolution of her pain and the absence of symptoms especially with activity I am very doubtful that she had a fracture.  Plan to continue activity as tolerated and advance home exercise program.  Use ankle brace as needed especially with unstable  terrain for the next few months.  Recheck with me as needed.  I spent 15 minutes with this patient, greater than 50% was face-to-face time counseling regarding ddx and plan.    Historical information moved to improve visibility of documentation.  Past Medical History:  Diagnosis Date  . Allergy    Zyrtec, Allegra.  . Anxiety    followed by Dr. Toy Cookey  . Back pain   . Bipolar 1 disorder ([place])   . Complication of anesthesia   . Depression   . Fibroid   . Gallbladder problem   . Gastroparesis 09/14/2012   gastric emptying study in 2014  . GERD (gastroesophageal reflux disease)   . Pneumonia    2013ish  . PONV (postoperative nausea and vomiting)   . Pre-diabetes   . PTSD (post-traumatic stress disorder)   . Vitamin D deficiency    Past Surgical History:  Procedure Laterality Date  . ANKLE ARTHROSCOPY Left 2011  . CESAREAN SECTION    . CHOLECYSTECTOMY    . DILATION AND CURETTAGE OF UTERUS    . FOREIGN BODY REMOVAL Left 11/18/2016   Procedure: REMOVAL FOREIGN BODY EXTREMITY LEFT FOOT;  Surgeon: Trula Slade, DPM;  Location: [facility];  Service: Podiatry;  Laterality: Left;  . PILONIDAL CYST EXCISION  1990's  . TENDON REPAIR Left 2011   Left Ankle  . WISDOM TOOTH EXTRACTION  19090's   Social History   Tobacco Use  . Smoking status:  Former Smoker    Years: 10.00    Types: Cigarettes    Last attempt to quit: 2003    Years since quitting: 16.5  . Smokeless tobacco: Never Used  Substance Use Topics  . Alcohol use: Yes    Alcohol/week: 0.0 oz    Comment: 1-2 per month    family history includes Anxiety disorder in her father and mother; Breast cancer in her maternal aunt; Cancer in her maternal grandmother and mother; Depression in her father and mother; Diabetes in her maternal grandmother; Heart disease in her maternal grandmother, paternal grandfather, and paternal grandmother; Heart disease (age of onset: 47) in her father; High blood pressure in her father;  Hyperlipidemia in her father, maternal grandmother, mother, and paternal grandfather; Hypertension in her father, maternal grandmother, and paternal grandmother; Mental illness in her paternal grandfather; Mental retardation in her father; Obesity in her father and mother; Rheum arthritis in her sister; Stroke in her maternal grandmother; Thyroid cancer in her paternal aunt.  Medications: Current Outpatient Medications  Medication Sig Dispense Refill  . Cholecalciferol (VITAMIN D3) 5000 units CAPS Take 5,000 Units by mouth at bedtime.    . clonazePAM (KLONOPIN) 0.5 MG tablet Take 1.5 mg by mouth at bedtime.    . diclofenac sodium (VOLTAREN) 1 % GEL Apply 4 g topically 4 (four) times daily. To affected joint. 100 g 11  . HYDROcodone-acetaminophen (NORCO/VICODIN) 5-325 MG tablet Take 1 tablet by mouth every 6 (six) hours as needed. 10 tablet 0  . L-methylfolate Calcium 15 MG TABS Take 15 mg by mouth at bedtime.     . lamoTRIgine (LAMICTAL) 200 MG tablet Take 200 mg by mouth at bedtime.    [name] levonorgestrel (MIRENA) 20 MCG/24HR IUD 1 each by Intrauterine route once. Place 03/2013    . loratadine (CLARITIN) 10 MG tablet Take 10 mg by mouth at bedtime.     . metFORMIN (GLUCOPHAGE) 1000 MG tablet Take 1 tablet (1,000 mg total) by mouth daily with breakfast. 30 tablet 0  . Multiple Vitamin (MULTIVITAMIN) tablet Take 1 tablet by mouth at bedtime.     . pantoprazole (PROTONIX) 40 MG tablet Take 40 mg by mouth daily.    . propranolol (INDERAL) 40 MG tablet Take 40 mg by mouth 2 (two) times daily.     . QUEtiapine (SEROQUEL) 200 MG tablet Take 100 mg by mouth at bedtime.     No current facility-administered medications for this visit.    Allergies  Allergen Reactions  . Penicillins Shortness Of Breath and Rash    Has patient had a PCN reaction causing immediate rash, facial/tongue/throat swelling, SOB or lightheadedness with hypotension: Yes Has patient had a PCN reaction causing severe rash involving  mucus membranes or skin necrosis: Yes Has patient had a PCN reaction that required hospitalization: No Has patient had a PCN reaction occurring within the last 10 years: No If all of the above answers are "NO", then may proceed with Cephalosporin use.   . Tamiflu [Oseltamivir Phosphate] Anaphylaxis  . Cephalosporins Rash  . Latex Rash      Discussed warning signs or symptoms. Please see discharge instructions. Patient expresses understanding.

## 2018-03-24 NOTE — Patient Instructions (Signed)
IUD Post-procedure Instructions . Cramping is common.  You may take Ibuprofen, Aleve, or Tylenol for the cramping.  This should resolve within 24 hours.   . You may have a small amount of spotting.  You should wear a mini pad for the next few days. . You may have intercourse in 24 hours. . You need to call the office if you have any pelvic pain, fever, heavy bleeding, or foul smelling vaginal discharge. . Shower or bathe as normal  EXERCISE AND DIET:  We recommended that you start or continue a regular exercise program for good health. Regular exercise means any activity that makes your heart beat faster and makes you sweat.  We recommend exercising at least 30 minutes per day at least 3 days a week, preferably 4 or 5.  We also recommend a diet low in fat and sugar.  Inactivity, poor dietary choices and obesity can cause diabetes, heart attack, stroke, and kidney damage, among others.    ALCOHOL AND SMOKING:  Women should limit their alcohol intake to no more than 7 drinks/beers/glasses of wine (combined, not each!) per week. Moderation of alcohol intake to this level decreases your risk of breast cancer and liver damage. And of course, no recreational drugs are part of a healthy lifestyle.  And absolutely no smoking or even second hand smoke. Most people know smoking can cause heart and lung diseases, but did you know it also contributes to weakening of your bones? Aging of your skin?  Yellowing of your teeth and nails?  CALCIUM AND VITAMIN D:  Adequate intake of calcium and Vitamin D are recommended.  The recommendations for exact amounts of these supplements seem to change often, but generally speaking 600 mg of calcium (either carbonate or citrate) and 800 units of Vitamin D per day seems prudent. Certain women may benefit from higher intake of Vitamin D.  If you are among these women, your doctor will have told you during your visit.    PAP SMEARS:  Pap smears, to check for cervical cancer or  precancers,  have traditionally been done yearly, although recent scientific advances have shown that most women can have pap smears less often.  However, every woman still should have a physical exam from her gynecologist every year. It will include a breast check, inspection of the vulva and vagina to check for abnormal growths or skin changes, a visual exam of the cervix, and then an exam to evaluate the size and shape of the uterus and ovaries.  And after 42 years of age, a rectal exam is indicated to check for rectal cancers. We will also provide age appropriate advice regarding health maintenance, like when you should have certain vaccines, screening for sexually transmitted diseases, bone density testing, colonoscopy, mammograms, etc.   MAMMOGRAMS:  All women over 42 years old should have a yearly mammogram. Many facilities now offer a "3D" mammogram, which may cost around $50 extra out of pocket. If possible,  we recommend you accept the option to have the 3D mammogram performed.  It both reduces the number of women who will be called back for extra views which then turn out to be normal, and it is better than the routine mammogram at detecting truly abnormal areas.    COLONOSCOPY:  Colonoscopy to screen for colon cancer is recommended for all women at age 93.  We know, you hate the idea of the prep.  We agree, BUT, having colon cancer and not knowing it is worse!!  Colon cancer so often starts as a polyp that can be seen and removed at colonscopy, which can quite literally save your life!  And if your first colonoscopy is normal and you have no family history of colon cancer, most women don't have to have it again for 10 years.  Once every ten years, you can do something that may end up saving your life, right?  We will be happy to help you get it scheduled when you are ready.  Be sure to check your insurance coverage so you understand how much it will cost.  It may be covered as a preventative service  at no cost, but you should check your particular policy.

## 2018-03-28 ENCOUNTER — Ambulatory Visit: Payer: 59

## 2018-03-29 ENCOUNTER — Ambulatory Visit (INDEPENDENT_AMBULATORY_CARE_PROVIDER_SITE_OTHER): Payer: 59 | Admitting: Psychology

## 2018-03-29 ENCOUNTER — Ambulatory Visit (INDEPENDENT_AMBULATORY_CARE_PROVIDER_SITE_OTHER): Payer: 59 | Admitting: Family Medicine

## 2018-03-29 VITALS — BP 129/78 | HR 85 | Temp 98.4°F | Ht 65.0 in | Wt 266.0 lb

## 2018-03-29 DIAGNOSIS — Z6841 Body Mass Index (BMI) 40.0 and over, adult: Secondary | ICD-10-CM | POA: Diagnosis not present

## 2018-03-29 DIAGNOSIS — R7303 Prediabetes: Secondary | ICD-10-CM | POA: Diagnosis not present

## 2018-03-29 DIAGNOSIS — Z9189 Other specified personal risk factors, not elsewhere classified: Secondary | ICD-10-CM

## 2018-03-29 DIAGNOSIS — F3289 Other specified depressive episodes: Secondary | ICD-10-CM

## 2018-03-29 MED ORDER — INSULIN PEN NEEDLE 32G X 4 MM MISC: 1.0000 | Freq: Two times a day (BID) | 0 refills | Status: DC

## 2018-03-29 MED ORDER — LIRAGLUTIDE 18 MG/3ML ~~LOC~~ SOPN: 0.6000 mg | PEN_INJECTOR | SUBCUTANEOUS | 0 refills | Status: DC

## 2018-03-29 MED ORDER — METFORMIN HCL 1000 MG PO TABS: 1000.0000 mg | ORAL_TABLET | Freq: Every day | ORAL | 0 refills | Status: DC

## 2018-03-29 MED FILL — metFORMIN HCL 1000 MG TABS: 1000 | 30 days supply | Qty: 30 | Fill #0

## 2018-03-29 NOTE — Progress Notes (Signed)
Office: [phone]  /  Fax: [phone]   HPI:   Chief Complaint: OBESITY Caroline Gonzales is here to discuss her progress with her obesity treatment plan. She is on the Category 3 plan and is following her eating plan approximately 0 % of the time. She states she is exercising 0 minutes 0 times per week. Caroline Gonzales is really struggling with food decisions. Eating significant amount of carbohydrates, breads, and sweets. Just got over an ankle sprain.  Her weight is 266 lb (120.7 kg) today and has gained 6 pounds since her last visit. She has lost 12 lbs since starting treatment with [name].  Pre-Diabetes Caroline Gonzales has a diagnosis of pre-diabetes based on her elevated Hgb A1c and was informed this puts her at greater risk of developing diabetes. She notes carbohydrate cravings. She is taking metformin currently and continues to work on diet and exercise to decrease risk of diabetes. She denies nausea or hypoglycemia.  At risk for diabetes Caroline Gonzales is at higher than average risk for developing diabetes due to her obesity and pre-diabetes. She currently denies polyuria or polydipsia.  Depression with emotional eating behaviors Caroline Gonzales see's Caroline Gonzales. Caroline Gonzales struggles with emotional eating and using food for comfort to the extent that it is negatively impacting her health. She often snacks when she is not hungry. Caroline Gonzales sometimes feels she is out of control and then feels guilty that she made poor food choices. She has been working on behavior modification techniques to help reduce her emotional eating and has been somewhat successful. She shows no sign of suicidal or homicidal ideations.  Depression screen Caroline Gonzales 2/9 03/29/2018 02/02/2018 04/14/2017 11/12/2016 08/31/2016  Decreased Interest 0 0 0 0 1  Down, Depressed, Hopeless 1 0 0 0 3  PHQ - 2 Score 1 0 0 0 4  Altered sleeping - - - - 1  Tired, decreased energy - - - - 3  Change in appetite - - - - 3  Feeling bad or failure about yourself  -  - - - 3  Trouble concentrating - - - - 0  Moving slowly or fidgety/restless - - - - 2  Suicidal thoughts - - - - 1  PHQ-9 Score - - - - 17    ALLERGIES: Allergies  Allergen Reactions  . Penicillins Shortness Of Breath and Rash    Has patient had a PCN reaction causing immediate rash, facial/tongue/throat swelling, SOB or lightheadedness with hypotension: Yes Has patient had a PCN reaction causing severe rash involving mucus membranes or skin necrosis: Yes Has patient had a PCN reaction that required hospitalization: No Has patient had a PCN reaction occurring within the last 10 years: No If all of the above answers are "NO", then may proceed with Cephalosporin use.   . Tamiflu [Oseltamivir Phosphate] Anaphylaxis  . Cephalosporins Rash  . Latex Rash    MEDICATIONS: Current Outpatient Medications on File Prior to Visit  Medication Sig Dispense Refill  . Cholecalciferol (VITAMIN D3) 5000 units CAPS Take 5,000 Units by mouth at bedtime.    . clonazePAM (KLONOPIN) 0.5 MG tablet Take 1.5 mg by mouth at bedtime.    . diclofenac sodium (VOLTAREN) 1 % GEL Apply 4 g topically 4 (four) times daily. To affected joint. 100 g 11  . L-methylfolate Calcium 15 MG TABS Take 15 mg by mouth at bedtime.     . lamoTRIgine (LAMICTAL) 200 MG tablet Take 200 mg by mouth at bedtime.    [name] levonorgestrel (MIRENA) 20 MCG/24HR IUD  1 each by Intrauterine route once. Place 03/2013    . loratadine (CLARITIN) 10 MG tablet Take 10 mg by mouth at bedtime.     . Multiple Vitamin (MULTIVITAMIN) tablet Take 1 tablet by mouth at bedtime.     . pantoprazole (PROTONIX) 40 MG tablet Take 40 mg by mouth daily.    . propranolol (INDERAL) 40 MG tablet Take 40 mg by mouth 2 (two) times daily.     . QUEtiapine (SEROQUEL) 200 MG tablet Take 100 mg by mouth at bedtime.     No current facility-administered medications on file prior to visit.     PAST MEDICAL HISTORY: Past Medical History:  Diagnosis Date  . Allergy     Zyrtec, Allegra.  . Anxiety    followed by Dr. Toy Cookey  . Back pain   . Bipolar 1 disorder ([place])   . Complication of anesthesia   . Depression   . Fibroid   . Gallbladder problem   . Gastroparesis 09/14/2012   gastric emptying study in 2014  . GERD (gastroesophageal reflux disease)   . Pneumonia    2013ish  . PONV (postoperative nausea and vomiting)   . Pre-diabetes   . PTSD (post-traumatic stress disorder)   . Vitamin D deficiency     PAST SURGICAL HISTORY: Past Surgical History:  Procedure Laterality Date  . ANKLE ARTHROSCOPY Left 2011  . CESAREAN SECTION    . CHOLECYSTECTOMY    . DILATION AND CURETTAGE OF UTERUS    . FOREIGN BODY REMOVAL Left 11/18/2016   Procedure: REMOVAL FOREIGN BODY EXTREMITY LEFT FOOT;  Surgeon: Trula Slade, DPM;  Location: [facility];  Service: Podiatry;  Laterality: Left;  . PILONIDAL CYST EXCISION  1990's  . TENDON REPAIR Left 2011   Left Ankle  . WISDOM TOOTH EXTRACTION  19090's    SOCIAL HISTORY: Social History   Tobacco Use  . Smoking status: Former Smoker    Years: 10.00    Types: Cigarettes    Last attempt to quit: 2003    Years since quitting: 16.5  . Smokeless tobacco: Never Used  Substance Use Topics  . Alcohol use: Yes    Alcohol/week: 0.0 oz    Comment: 1-2 per month   . Drug use: No    FAMILY HISTORY: Family History  Problem Relation Age of Onset  . Cancer Mother        squamous cell carcinoma  . Hyperlipidemia Mother   . Depression Mother   . Anxiety disorder Mother   . Obesity Mother   . Heart disease Father 45       cardiomegaly, CHF; steroid use.  [name] Hyperlipidemia Father   . Hypertension Father   . Mental retardation Father   . High blood pressure Father   . Depression Father   . Anxiety disorder Father   . Obesity Father   . Cancer Maternal Grandmother   . Diabetes Maternal Grandmother   . Heart disease Maternal Grandmother   . Hyperlipidemia Maternal Grandmother   . Hypertension Maternal Grandmother     . Stroke Maternal Grandmother   . Heart disease Paternal Grandmother   . Hypertension Paternal Grandmother   . Heart disease Paternal Grandfather   . Hyperlipidemia Paternal Grandfather   . Mental illness Paternal Grandfather   . Rheum arthritis Sister   . Breast cancer Maternal Aunt   . Thyroid cancer Paternal Aunt     ROS: Review of Systems  Constitutional: Negative for weight loss.  Gastrointestinal: Negative for  nausea.  Genitourinary: Negative for frequency.  Endo/Heme/Allergies: Negative for polydipsia.       Negative hypoglycemia  Psychiatric/Behavioral: Positive for depression. Negative for suicidal ideas.    PHYSICAL EXAM: Blood pressure 129/78, pulse 85, temperature 98.4 F (36.9 C), temperature source Oral, height 5\' 5"  (1.651 m), weight 266 lb (120.7 kg), SpO2 97 %. Body mass index is 44.26 kg/m. Physical Exam  Constitutional: She is oriented to person, place, and time. She appears well-developed and well-nourished.  Cardiovascular: Normal rate.  Pulmonary/Chest: Effort normal.  Musculoskeletal: Normal range of motion.  Neurological: She is oriented to person, place, and time.  Skin: Skin is warm and dry.  Psychiatric: She has a normal mood and affect. Her behavior is normal.  Vitals reviewed.   RECENT LABS AND TESTS: BMET    Component Value Date/Time   NA 138 01/05/2018 0915   NA 141 10/07/2017 1255   K 4.2 01/05/2018 0915   CL 107 01/05/2018 0915   CO2 23 01/05/2018 0915   GLUCOSE 97 01/05/2018 0915   BUN 17 01/05/2018 0915   BUN 18 10/07/2017 1255   CREATININE 0.93 01/05/2018 0915   CREATININE 0.95 06/27/2016 0933   CALCIUM 8.9 01/05/2018 0915   GFRNONAA >60 01/05/2018 0915   GFRAA >60 01/05/2018 0915   Lab Results  Component Value Date   HGBA1C 5.4 02/10/2018   HGBA1C 5.5 10/07/2017   HGBA1C 5.1 06/22/2017   HGBA1C 5.3 01/11/2017   HGBA1C 5.7 (H) 08/31/2016   Lab Results  Component Value Date   INSULIN 20.3 02/10/2018   INSULIN 15.6  10/07/2017   INSULIN 25.2 (H) 06/22/2017   INSULIN 17.0 01/11/2017   INSULIN 17.4 08/31/2016   CBC    Component Value Date/Time   WBC 5.5 01/05/2018 0915   RBC 4.02 01/05/2018 0915   HGB 11.1 (L) 01/05/2018 0915   HGB 11.2 06/22/2017 0904   HCT 35.4 (L) 01/05/2018 0915   HCT 34.8 06/22/2017 0904   PLT 285 01/05/2018 0915   PLT 332 11/12/2016 1651   MCV 88.1 01/05/2018 0915   MCV 86 06/22/2017 0904   MCH 27.6 01/05/2018 0915   MCHC 31.4 01/05/2018 0915   RDW 14.4 01/05/2018 0915   RDW 15.7 (H) 06/22/2017 0904   LYMPHSABS 1.5 06/22/2017 0904   MONOABS 891 04/30/2016 0947   EOSABS 0.2 06/22/2017 0904   BASOSABS 0.0 06/22/2017 0904   Iron/TIBC/Ferritin/ %Sat    Component Value Date/Time   IRON 53 08/31/2016 1644   TIBC 280 08/31/2016 1644   FERRITIN 36 08/31/2016 1644   IRONPCTSAT 19 08/31/2016 1644   Lipid Panel     Component Value Date/Time   CHOL 133 02/10/2018 0749   TRIG 199 (H) 02/10/2018 0749   HDL 38 (L) 02/10/2018 0749   CHOLHDL 3.5 02/10/2018 0749   CHOLHDL 4.1 12/01/2015 0914   VLDL 37 (H) 12/01/2015 0914   LDLCALC 55 02/10/2018 0749   Hepatic Function Panel     Component Value Date/Time   PROT 7.9 01/05/2018 0915   PROT 7.3 10/07/2017 1255   ALBUMIN 3.5 01/05/2018 0915   ALBUMIN 4.0 10/07/2017 1255   AST 20 01/05/2018 0915   ALT 18 01/05/2018 0915   ALKPHOS 75 01/05/2018 0915   BILITOT 0.5 01/05/2018 0915   BILITOT <0.2 10/07/2017 1255      Component Value Date/Time   TSH 1.620 08/31/2016 1644   TSH 1.83 12/01/2015 0914    ASSESSMENT AND PLAN: Prediabetes - Plan: liraglutide (VICTOZA) 18 MG/3ML SOPN, Insulin  Pen Needle (BD PEN NEEDLE NANO 2ND GEN) 32G X 4 MM MISC, metFORMIN (GLUCOPHAGE) 1000 MG tablet  Other depression - with emotional eating  At risk for diabetes mellitus  Class 3 severe obesity with serious comorbidity and body mass index (BMI) of 40.0 to 44.9 in adult, unspecified obesity type Caroline Gonzales)  PLAN:  Pre-Diabetes Caroline Gonzales  will continue to work on weight loss, exercise, and decreasing simple carbohydrates in her diet to help decrease the risk of diabetes. We dicussed metformin including benefits and risks. She was informed that eating too many simple carbohydrates or too many calories at one sitting increases the likelihood of GI side effects. Nin[place]ka agrees to start Victoza 0.6 mg SubQ daily #1 pen, with no refills and lancets; and she agrees to continue taking metformin 1,000 mg PO daily #30 and we will refill for 1 month. She is to stop metformin when getting Victoza. Leslyn agrees to follow up with our clinic in 2 weeks as directed to monitor her progress.  Diabetes risk counselling Maelin was given extended (15 minutes) diabetes prevention counseling today. She is 42 y.o. female and has risk factors for diabetes including obesity and pre-diabetes. We discussed intensive lifestyle modifications today with an emphasis on weight loss as well as increasing exercise and decreasing simple carbohydrates in her diet.  Depression with Emotional Eating Behaviors We discussed behavior modification techniques today to help Douglas deal with her emotional eating and depression. We will refer to Dr. Mallie Mussel, our bariatric psychologist for intake. Shavell agrees to follow up with our clinic in 2 weeks.  Obesity Mallarie is currently in the action stage of change. As such, her goal is to continue with weight loss efforts She has agreed to follow the Category 3 plan with breakfast Jerris has been instructed to work up to a goal of 150 minutes of combined cardio and strengthening exercise per week for weight loss and overall health benefits. We discussed the following Behavioral Modification Strategies today: increasing lean protein intake, increasing vegetables, work on meal planning and easy cooking plans, and planning for success Plan to follow breakfast of Category 3 for the next two weeks.  Lucendia has agreed to follow up  with our clinic in 2 weeks. She was informed of the importance of frequent follow up visits to maximize her success with intensive lifestyle modifications for her multiple health conditions.   I, Trixie Dredge, am acting as transcriptionist for Ilene Qua, MD  I have reviewed the above documentation for accuracy and completeness, and I agree with the above. - Ilene Qua, MD
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
## 2018-03-30 NOTE — Progress Notes (Unsigned)
Office: [phone]  /  Fax: [phone]  Date: April 13, 2018 Time Seen:*** Duration:*** Provider: Glennie Gonzales, Psy.D. Type of Session: Individual Therapy   HPI: Caroline Gonzales was referred by Dr. Ilene Gonzales due to an increase in binge eating behaviors and ongoing stressors. During the initial appointment with this provider on March 30, 2018, Caroline Gonzales shared approximately one month ago her step-daughter began exhibiting disruptive behaviors resulting in "turmoil" within the family. She indicated she has since sought therapeutic services for her step-daughter. Caroline Gonzales indicated therapy for her step-daughter has also included developing a safety plan and family sessions. Per Caroline Gonzales, her step-daughter is currently with family in [place] and she is unsure when her step-daughter will return. Furthermore, Caroline Gonzales reported she hides and binge eats. Caroline Gonzales described "putting in effort to obtain unhealthy food." She shared she last engaged in binge eating this morning. More specifically, she had breakfast at [place], breakfast at [place], and then a croissant. Additionally, Caroline Gonzales reported she has awareness when it is "a bad idea," but experiences difficulty doing something productive due to her current responsibilities. Per the note for the initial visit with Dr. Dennard Gonzales on August 31, 2016, Caroline Gonzales'sheaviest weight ever is now. Additionally, at the initial appointment she reported shehas significant food cravings issues, frequently makes poor food choices, and struggles with emotional eating. Her PHQ-9 Depression Scale score for that visit was 17. During the initial appointment with this provider on March 30, 2018, she noted, "I've always had a bad relationship with food" and when her parents "split up" when she was 68 years old, she gained weight. During this time, her mother would come home and "pass out." Thus, Caroline Gonzales shared she was responsible for feeding herself and her sister. Her father also  reportedly told her "no one would love" her if she was overweight; therefore, he "made" her "get on the Stair Master" to exercise.   Session Content: Session focused on the goals of improve coping skills and decrease emotional eating. The session was initiated with the administration of the PHQ-9 and GAD-7, as well as a a brief check-in.   Caroline Gonzales was receptive to today's session as evidenced by ***.  Mental Status Examination: Caroline Gonzales arrived on time for the appointment. She presented as appropriately dressed and groomed. Caroline Gonzales appeared her stated age and demonstrated adequate orientation to time, place, person, and purpose of the appointment. She also demonstrated appropriate eye contact. No psychomotor abnormalities or behavioral peculiarities noted. Her mood was *** with congruent affect. Her thought processes were logical, linear, and goal-directed. No hallucinations, delusions, bizarre thinking or behavior reported or observed. Judgment, insight, and impulse control appeared to be grossly intact. There was no evidence of paraphasias (i.e., errors in speech, gross mispronunciations, and word substitutions), repetition deficits, or disturbances in volume or prosody (i.e., rhythm and intonation). There was no evidence of attention or memory impairments. Caroline Gonzales denied current suicidal and homicidal ideation, intent or plan.  Structured Assessment Results:The Patient Health Questionnaire-9 (PHQ-9) is a self-report measure that assesses symptoms and severity of depression over the course of the last two weeks. Caroline Gonzales obtained a score of *** suggesting [no/minimal/mild/moderate/moderately severe/severe] depression. The following items were endorsed: ***. Caroline Gonzales finds the aforementioned symptoms to be ***[not difficult at all/somewhat difficult/very difficult/extremely difficult].  The Generalized Anxiety Disorder-7 (GAD-7) is a brief self-report measure that assesses symptoms of anxiety over the  course of the last two weeks. Caroline Gonzales obtained a score of *** suggesting ***[no/minimal/mild/moderate/severe] anxiety. The following items were endorsed: ***.  Caroline Gonzales finds  the aforementioned symptoms to be ***[not difficult at all/somewhat difficult/very difficult/extremely difficult].  Interventions: Caroline Gonzales was administered the PHQ-9 and GAD-7 for symptom monitoring. Content from the last session was reviewed. Throughout today's session, empathic reflections and validation were provided. Psychoeducation regarding *** was provided and *** [insert other interventions].   DSM-5 Diagnosis: 311 (F32.8) Other Specified Depressive Disorder, Emotional Eating Behaviors and Anxious Distress  Plan: [name] continues to appear able and willing to participate as evidenced by engagement in reciprocal conversation, and asking questions for clarification as appropriate.*** The next appointment will be scheduled in ***. The next session will focus on reviewing learned skills, and working towards established treatment goals.

## 2018-04-04 MED FILL — lamoTRIgine 200 MG TABS: 200 | 90 days supply | Qty: 90 | Fill #1

## 2018-04-04 MED FILL — PANTOPRAZOLE SOD DR 40 MG T: 40 | 30 days supply | Qty: 30 | Fill #0

## 2018-04-05 ENCOUNTER — Other Ambulatory Visit (INDEPENDENT_AMBULATORY_CARE_PROVIDER_SITE_OTHER): Payer: Self-pay

## 2018-04-05 ENCOUNTER — Encounter (INDEPENDENT_AMBULATORY_CARE_PROVIDER_SITE_OTHER): Payer: Self-pay

## 2018-04-05 DIAGNOSIS — R7303 Prediabetes: Secondary | ICD-10-CM

## 2018-04-05 MED ORDER — DULAGLUTIDE 0.75 MG/0.5ML ~~LOC~~ SOAJ: 0.7500 mg | SUBCUTANEOUS | 0 refills | Status: DC

## 2018-04-05 MED FILL — TRULICITY 0.75 MG/0.5 ML PE: 0.75 | 28 days supply | Qty: 2 | Fill #0

## 2018-04-07 MED FILL — UNIFINE PENTIPS 32GX5/32": 32G X 4 MM | 50 days supply | Qty: 100 | Fill #0

## 2018-04-07 MED FILL — UNIFINE PENTIPS 32GX5/32: 32G X 4 MM | 50 days supply | Qty: 100 | Fill #0

## 2018-04-11 DIAGNOSIS — F4322 Adjustment disorder with anxiety: Secondary | ICD-10-CM | POA: Diagnosis not present

## 2018-04-13 ENCOUNTER — Ambulatory Visit (INDEPENDENT_AMBULATORY_CARE_PROVIDER_SITE_OTHER): Payer: Self-pay | Admitting: Psychology

## 2018-04-13 ENCOUNTER — Ambulatory Visit (INDEPENDENT_AMBULATORY_CARE_PROVIDER_SITE_OTHER): Payer: 59 | Admitting: Family Medicine

## 2018-04-13 VITALS — BP 112/75 | HR 80 | Temp 97.8°F | Ht 65.0 in | Wt 261.0 lb

## 2018-04-13 DIAGNOSIS — Z6841 Body Mass Index (BMI) 40.0 and over, adult: Secondary | ICD-10-CM

## 2018-04-13 DIAGNOSIS — R7303 Prediabetes: Secondary | ICD-10-CM

## 2018-04-13 DIAGNOSIS — F3289 Other specified depressive episodes: Secondary | ICD-10-CM | POA: Diagnosis not present

## 2018-04-14 NOTE — Progress Notes (Signed)
Office: [phone]  /  Fax: [phone]   HPI:   Chief Complaint: OBESITY Caroline Gonzales is here to discuss her progress with her obesity treatment plan. She is on the Category 3 plan with breakfast options and is following her eating plan approximately 25 % of the time. She states she is exercising 0 minutes 0 times per week. Kodi has struggled with the meal plan, secondary to starting Trulicity and free drinks at [employer]. Her weight is 261 lb (118.4 kg) today and has had a weight loss of 5 pounds over a period of 2 weeks since her last visit. She has lost 17 lbs since starting treatment with [name].  Pre-Diabetes Caroline Gonzales has a diagnosis of prediabetes based on her elevated Hgb A1c and was informed this puts her at greater risk of developing diabetes. Caroline Gonzales is taking metformin and trulicity currently and she continues to work on diet and exercise to decrease risk of diabetes. She has occasional carb cravings and some nausea with starting trulicity, but no abdominal pain or vomiting. She denies hypoglycemia.  Depression with emotional eating behaviors Caroline Gonzales saw and truly found Dr. Mallie Mussel to be helpful, but Dr. Mallie Mussel is Tier 4 coverage. Caroline Gonzales struggles with emotional eating and using food for comfort to the extent that it is negatively impacting her health. She often snacks when she is not hungry. Caroline Gonzales sometimes feels she is out of control and then feels guilty that she made poor food choices. She has been working on behavior modification techniques to help reduce her emotional eating and has been somewhat successful. She shows no sign of suicidal or homicidal ideations.  Depression screen [facility] 2/9 03/29/2018 02/02/2018 04/14/2017 11/12/2016 08/31/2016  Decreased Interest 0 0 0 0 1  Down, Depressed, Hopeless 1 0 0 0 3  PHQ - 2 Score 1 0 0 0 4  Altered sleeping 3 - - - 1  Tired, decreased energy 1 - - - 3  Change in appetite 3 - - - 3  Feeling bad or failure about yourself  2 - - - 3  Trouble  concentrating 0 - - - 0  Moving slowly or fidgety/restless 0 - - - 2  Suicidal thoughts 0 - - - 1  PHQ-9 Score 10 - - - 17     ALLERGIES: Allergies  Allergen Reactions  . Penicillins Shortness Of Breath and Rash    Has patient had a PCN reaction causing immediate rash, facial/tongue/throat swelling, SOB or lightheadedness with hypotension: Yes Has patient had a PCN reaction causing severe rash involving mucus membranes or skin necrosis: Yes Has patient had a PCN reaction that required hospitalization: No Has patient had a PCN reaction occurring within the last 10 years: No If all of the above answers are "NO", then may proceed with Cephalosporin use.   . Tamiflu [Oseltamivir Phosphate] Anaphylaxis  . Cephalosporins Rash  . Latex Rash    MEDICATIONS: Current Outpatient Medications on File Prior to Visit  Medication Sig Dispense Refill  . Cholecalciferol (VITAMIN D3) 5000 units CAPS Take 5,000 Units by mouth at bedtime.    . clonazePAM (KLONOPIN) 0.5 MG tablet Take 1.5 mg by mouth at bedtime.    . Dulaglutide (TRULICITY) 5.27 PO/2.4MP SOPN Inject 0.75 mg into the skin every 7 (seven) days. 4 pen 0  . L-methylfolate Calcium 15 MG TABS Take 15 mg by mouth at bedtime.     . lamoTRIgine (LAMICTAL) 200 MG tablet Take 200 mg by mouth at bedtime.    [name] levonorgestrel (  MIRENA) 20 MCG/24HR IUD 1 each by Intrauterine route once. Place 03/2013    . loratadine (CLARITIN) 10 MG tablet Take 10 mg by mouth at bedtime.     . metFORMIN (GLUCOPHAGE) 1000 MG tablet Take 1 tablet (1,000 mg total) by mouth daily with breakfast. (Patient taking differently: Take 1,000 mg by mouth daily with breakfast. Take 1/2 pill daily) 30 tablet 0  . Multiple Vitamin (MULTIVITAMIN) tablet Take 1 tablet by mouth at bedtime.     . pantoprazole (PROTONIX) 40 MG tablet Take 40 mg by mouth daily.    . propranolol (INDERAL) 40 MG tablet Take 40 mg by mouth 2 (two) times daily.     . QUEtiapine (SEROQUEL) 200 MG tablet Take  100 mg by mouth at bedtime.     No current facility-administered medications on file prior to visit.     PAST MEDICAL HISTORY: Past Medical History:  Diagnosis Date  . Allergy    Zyrtec, Allegra.  . Anxiety    followed by Dr. Toy Cookey  . Back pain   . Bipolar 1 disorder ([place])   . Complication of anesthesia   . Depression   . Fibroid   . Gallbladder problem   . Gastroparesis 09/14/2012   gastric emptying study in 2014  . GERD (gastroesophageal reflux disease)   . Pneumonia    2013ish  . PONV (postoperative nausea and vomiting)   . Pre-diabetes   . PTSD (post-traumatic stress disorder)   . Vitamin D deficiency     PAST SURGICAL HISTORY: Past Surgical History:  Procedure Laterality Date  . ANKLE ARTHROSCOPY Left 2011  . CESAREAN SECTION    . CHOLECYSTECTOMY    . DILATION AND CURETTAGE OF UTERUS    . FOREIGN BODY REMOVAL Left 11/18/2016   Procedure: REMOVAL FOREIGN BODY EXTREMITY LEFT FOOT;  Surgeon: Trula Slade, DPM;  Location: [place];  Service: Podiatry;  Laterality: Left;  . PILONIDAL CYST EXCISION  1990's  . TENDON REPAIR Left 2011   Left Ankle  . WISDOM TOOTH EXTRACTION  19090's    SOCIAL HISTORY: Social History   Tobacco Use  . Smoking status: Former Smoker    Years: 10.00    Types: Cigarettes    Last attempt to quit: 2003    Years since quitting: 16.5  . Smokeless tobacco: Never Used  Substance Use Topics  . Alcohol use: Yes    Alcohol/week: 0.0 oz    Comment: 1-2 per month   . Drug use: No    FAMILY HISTORY: Family History  Problem Relation Age of Onset  . Cancer Mother        squamous cell carcinoma  . Hyperlipidemia Mother   . Depression Mother   . Anxiety disorder Mother   . Obesity Mother   . Heart disease Father 1       cardiomegaly, CHF; steroid use.  [name] Hyperlipidemia Father   . Hypertension Father   . Mental retardation Father   . High blood pressure Father   . Depression Father   . Anxiety disorder Father   . Obesity Father     . Cancer Maternal Grandmother   . Diabetes Maternal Grandmother   . Heart disease Maternal Grandmother   . Hyperlipidemia Maternal Grandmother   . Hypertension Maternal Grandmother   . Stroke Maternal Grandmother   . Heart disease Paternal Grandmother   . Hypertension Paternal Grandmother   . Heart disease Paternal Grandfather   . Hyperlipidemia Paternal Grandfather   . Mental illness  Paternal Grandfather   . Rheum arthritis Sister   . Breast cancer Maternal Aunt   . Thyroid cancer Paternal Aunt     ROS: Review of Systems  Constitutional: Positive for weight loss.  Gastrointestinal: Positive for nausea. Negative for abdominal pain and vomiting.  Endo/Heme/Allergies:       Positive for carb cravings Negative for hypoglycemia  Psychiatric/Behavioral: Positive for depression. Negative for suicidal ideas.    PHYSICAL EXAM: Blood pressure 112/75, pulse 80, temperature 97.8 F (36.6 C), temperature source Oral, height 5\' 5"  (1.651 m), weight 261 lb (118.4 kg), SpO2 97 %. Body mass index is 43.43 kg/m. Physical Exam  Constitutional: She is oriented to person, place, and time. She appears well-developed and well-nourished.  Cardiovascular: Normal rate.  Pulmonary/Chest: Effort normal.  Musculoskeletal: Normal range of motion.  Neurological: She is oriented to person, place, and time.  Skin: Skin is warm and dry.  Psychiatric: She has a normal mood and affect. Her behavior is normal.  Vitals reviewed.   RECENT LABS AND TESTS: BMET    Component Value Date/Time   NA 138 01/05/2018 0915   NA 141 10/07/2017 1255   K 4.2 01/05/2018 0915   CL 107 01/05/2018 0915   CO2 23 01/05/2018 0915   GLUCOSE 97 01/05/2018 0915   BUN 17 01/05/2018 0915   BUN 18 10/07/2017 1255   CREATININE 0.93 01/05/2018 0915   CREATININE 0.95 06/27/2016 0933   CALCIUM 8.9 01/05/2018 0915   GFRNONAA >60 01/05/2018 0915   GFRAA >60 01/05/2018 0915   Lab Results  Component Value Date   HGBA1C 5.4  02/10/2018   HGBA1C 5.5 10/07/2017   HGBA1C 5.1 06/22/2017   HGBA1C 5.3 01/11/2017   HGBA1C 5.7 (H) 08/31/2016   Lab Results  Component Value Date   INSULIN 20.3 02/10/2018   INSULIN 15.6 10/07/2017   INSULIN 25.2 (H) 06/22/2017   INSULIN 17.0 01/11/2017   INSULIN 17.4 08/31/2016   CBC    Component Value Date/Time   WBC 5.5 01/05/2018 0915   RBC 4.02 01/05/2018 0915   HGB 11.1 (L) 01/05/2018 0915   HGB 11.2 06/22/2017 0904   HCT 35.4 (L) 01/05/2018 0915   HCT 34.8 06/22/2017 0904   PLT 285 01/05/2018 0915   PLT 332 11/12/2016 1651   MCV 88.1 01/05/2018 0915   MCV 86 06/22/2017 0904   MCH 27.6 01/05/2018 0915   MCHC 31.4 01/05/2018 0915   RDW 14.4 01/05/2018 0915   RDW 15.7 (H) 06/22/2017 0904   LYMPHSABS 1.5 06/22/2017 0904   MONOABS 891 04/30/2016 0947   EOSABS 0.2 06/22/2017 0904   BASOSABS 0.0 06/22/2017 0904   Iron/TIBC/Ferritin/ %Sat    Component Value Date/Time   IRON 53 08/31/2016 1644   TIBC 280 08/31/2016 1644   FERRITIN 36 08/31/2016 1644   IRONPCTSAT 19 08/31/2016 1644   Lipid Panel     Component Value Date/Time   CHOL 133 02/10/2018 0749   TRIG 199 (H) 02/10/2018 0749   HDL 38 (L) 02/10/2018 0749   CHOLHDL 3.5 02/10/2018 0749   CHOLHDL 4.1 12/01/2015 0914   VLDL 37 (H) 12/01/2015 0914   LDLCALC 55 02/10/2018 0749   Hepatic Function Panel     Component Value Date/Time   PROT 7.9 01/05/2018 0915   PROT 7.3 10/07/2017 1255   ALBUMIN 3.5 01/05/2018 0915   ALBUMIN 4.0 10/07/2017 1255   AST 20 01/05/2018 0915   ALT 18 01/05/2018 0915   ALKPHOS 75 01/05/2018 0915   BILITOT 0.5  01/05/2018 0915   BILITOT <0.2 10/07/2017 1255      Component Value Date/Time   TSH 1.620 08/31/2016 1644   TSH 1.83 12/01/2015 0914   Results for ZARIA, TAHA (MRN [id number]) as of 04/14/2018 08:27  Ref. Range 02/10/2018 07:49  Vitamin D, 25-Hydroxy Latest Ref Range: 30.0 - 100.0 ng/mL 58.5   ASSESSMENT AND PLAN: Prediabetes  Other depression - with  emotional eating  Class 3 severe obesity with serious comorbidity and body mass index (BMI) of 40.0 to 44.9 in adult, unspecified obesity type [facility])  PLAN:  Pre-Diabetes Caroline Gonzales will continue to work on weight loss, exercise, and decreasing simple carbohydrates in her diet to help decrease the risk of diabetes. We dicussed metformin including benefits and risks. She was informed that eating too many simple carbohydrates or too many calories at one sitting increases the likelihood of GI side effects. Gaynelle agreed to decrease metformin to 500 mg daily (patient to break pills in half). She will increase frequency of small portions to help control nausea of trulicity. Caroline Gonzales agreed to follow up with [name] as directed to monitor her progress.  Depression with Emotional Eating Behaviors We discussed behavior modification techniques today to help Elizet deal with her emotional eating and depression. We will follow up with credentialing if Dr. Mallie Mussel becomes Tier 2, and will send patient for follow up visit. She  agreed to follow up as directed.  We spent > than 50% of the 15 minute visit on the counseling as documented in the note.  Obesity Caroline Gonzales is currently in the action stage of change. As such, her goal is to continue with weight loss efforts She has agreed to follow the Category 3 plan Estha has been instructed to work up to a goal of 150 minutes of combined cardio and strengthening exercise per week for weight loss and overall health benefits. We discussed the following Behavioral Modification Strategies today: planning for success, increasing lean protein intake, increasing vegetables and work on meal planning and easy cooking plans  Caroline Gonzales has agreed to follow up with our clinic in 2 weeks. She was informed of the importance of frequent follow up visits to maximize her success with intensive lifestyle modifications for her multiple health conditions.  I, Doreene Nest, am acting as  transcriptionist for Eber Jones, MD  I have reviewed the above documentation for accuracy and completeness, and I agree with the above. - Ilene Qua, MD

## 2018-04-21 ENCOUNTER — Encounter (INDEPENDENT_AMBULATORY_CARE_PROVIDER_SITE_OTHER): Payer: Self-pay | Admitting: Family Medicine

## 2018-04-21 ENCOUNTER — Other Ambulatory Visit: Payer: Self-pay | Admitting: Obstetrics and Gynecology

## 2018-04-21 DIAGNOSIS — Z1231 Encounter for screening mammogram for malignant neoplasm of breast: Secondary | ICD-10-CM

## 2018-04-25 MED FILL — clonazePAM 0.5 MG TABS: 0.5 | 30 days supply | Qty: 120 | Fill #2

## 2018-05-03 ENCOUNTER — Ambulatory Visit (INDEPENDENT_AMBULATORY_CARE_PROVIDER_SITE_OTHER): Payer: 59 | Admitting: Family Medicine

## 2018-05-03 VITALS — BP 114/77 | HR 83 | Temp 97.9°F | Ht 65.0 in | Wt 263.0 lb

## 2018-05-03 DIAGNOSIS — R7303 Prediabetes: Secondary | ICD-10-CM

## 2018-05-03 DIAGNOSIS — Z9189 Other specified personal risk factors, not elsewhere classified: Secondary | ICD-10-CM | POA: Diagnosis not present

## 2018-05-03 DIAGNOSIS — Z6841 Body Mass Index (BMI) 40.0 and over, adult: Secondary | ICD-10-CM | POA: Diagnosis not present

## 2018-05-03 MED ORDER — METFORMIN HCL 1000 MG PO TABS: 1000.0000 mg | ORAL_TABLET | Freq: Every day | ORAL | 0 refills | Status: DC

## 2018-05-03 MED ORDER — DULAGLUTIDE 0.75 MG/0.5ML ~~LOC~~ SOAJ: 0.7500 mg | SUBCUTANEOUS | 0 refills | Status: DC

## 2018-05-03 MED FILL — TRULICITY 0.75 MG/0.5 ML PE: 0.75 | 28 days supply | Qty: 2 | Fill #0

## 2018-05-03 MED FILL — metFORMIN HCL 1000 MG TABS: 1000 | 30 days supply | Qty: 30 | Fill #0

## 2018-05-03 NOTE — Progress Notes (Signed)
Office: [phone]  /  Fax: [phone]   HPI:   Chief Complaint: OBESITY Caroline Gonzales is here to discuss her progress with her obesity treatment plan. She is on the Category 3 plan and is following her eating plan approximately 20 % of the time. She states she is exercising 0 minutes 0 times per week. Caroline Gonzales is having a hard time committing to any part of the meal plan. She notes lots of stress at work and home, and significantly eating emotionally.  Her weight is 263 lb (119.3 kg) today and has gained 2 pounds since her last visit. She has lost 15 lbs since starting treatment with [name].  Pre-Diabetes Caroline Gonzales has a diagnosis of pre-diabetes based on her elevated Hgb A1c and was informed this puts her at greater risk of developing diabetes. She is on metformin and Trulicity, and she notes carbohydrate cravings. She continues to work on diet and exercise to decrease risk of diabetes. She denies nausea or hypoglycemia.  At risk for diabetes Caroline Gonzales is at higher than average risk for developing diabetes due to her obesity and pre-diabetes. She currently denies polyuria or polydipsia.  Vitamin D Deficiency Caroline Gonzales has a diagnosis of vitamin D deficiency. She is on OTC Vit D 5,000 IU daily. She denies nausea, vomiting or muscle weakness.  ALLERGIES: Allergies  Allergen Reactions  . Penicillins Shortness Of Breath and Rash    Has patient had a PCN reaction causing immediate rash, facial/tongue/throat swelling, SOB or lightheadedness with hypotension: Yes Has patient had a PCN reaction causing severe rash involving mucus membranes or skin necrosis: Yes Has patient had a PCN reaction that required hospitalization: No Has patient had a PCN reaction occurring within the last 10 years: No If all of the above answers are "NO", then may proceed with Cephalosporin use.   . Tamiflu [Oseltamivir Phosphate] Anaphylaxis  . Cephalosporins Rash  . Latex Rash    MEDICATIONS: Current Outpatient  Medications on File Prior to Visit  Medication Sig Dispense Refill  . Cholecalciferol (VITAMIN D3) 5000 units CAPS Take 5,000 Units by mouth at bedtime.    . clonazePAM (KLONOPIN) 0.5 MG tablet Take 1.5 mg by mouth at bedtime.    . L-methylfolate Calcium 15 MG TABS Take 15 mg by mouth at bedtime.     . lamoTRIgine (LAMICTAL) 200 MG tablet Take 200 mg by mouth at bedtime.    Caroline Gonzales levonorgestrel (MIRENA) 20 MCG/24HR IUD 1 each by Intrauterine route once. Place 03/2013    . loratadine (CLARITIN) 10 MG tablet Take 10 mg by mouth at bedtime.     . Multiple Vitamin (MULTIVITAMIN) tablet Take 1 tablet by mouth at bedtime.     . pantoprazole (PROTONIX) 40 MG tablet Take 40 mg by mouth daily.    . propranolol (INDERAL) 40 MG tablet Take 40 mg by mouth 2 (two) times daily.     . QUEtiapine (SEROQUEL) 200 MG tablet Take 100 mg by mouth at bedtime.     No current facility-administered medications on file prior to visit.     PAST MEDICAL HISTORY: Past Medical History:  Diagnosis Date  . Allergy    Zyrtec, Allegra.  . Anxiety    followed by Dr. Toy Cookey  . Back pain   . Bipolar 1 disorder ([place])   . Complication of anesthesia   . Depression   . Fibroid   . Gallbladder problem   . Gastroparesis 09/14/2012   gastric emptying study in 2014  . GERD (gastroesophageal reflux disease)   .  Pneumonia    2013ish  . PONV (postoperative nausea and vomiting)   . Pre-diabetes   . PTSD (post-traumatic stress disorder)   . Vitamin D deficiency     PAST SURGICAL HISTORY: Past Surgical History:  Procedure Laterality Date  . ANKLE ARTHROSCOPY Left 2011  . CESAREAN SECTION    . CHOLECYSTECTOMY    . DILATION AND CURETTAGE OF UTERUS    . FOREIGN BODY REMOVAL Left 11/18/2016   Procedure: REMOVAL FOREIGN BODY EXTREMITY LEFT FOOT;  Surgeon: Trula Slade, DPM;  Location: [facility];  Service: Podiatry;  Laterality: Left;  . PILONIDAL CYST EXCISION  1990's  . TENDON REPAIR Left 2011   Left Ankle  . WISDOM TOOTH  EXTRACTION  19090's    SOCIAL HISTORY: Social History   Tobacco Use  . Smoking status: Former Smoker    Years: 10.00    Types: Cigarettes    Last attempt to quit: 2003    Years since quitting: 16.6  . Smokeless tobacco: Never Used  Substance Use Topics  . Alcohol use: Yes    Alcohol/week: 0.0 standard drinks    Comment: 1-2 per month   . Drug use: No    FAMILY HISTORY: Family History  Problem Relation Age of Onset  . Cancer Mother        squamous cell carcinoma  . Hyperlipidemia Mother   . Depression Mother   . Anxiety disorder Mother   . Obesity Mother   . Heart disease Father 36       cardiomegaly, CHF; steroid use.  Caroline Gonzales Hyperlipidemia Father   . Hypertension Father   . Mental retardation Father   . High blood pressure Father   . Depression Father   . Anxiety disorder Father   . Obesity Father   . Cancer Maternal Grandmother   . Diabetes Maternal Grandmother   . Heart disease Maternal Grandmother   . Hyperlipidemia Maternal Grandmother   . Hypertension Maternal Grandmother   . Stroke Maternal Grandmother   . Heart disease Paternal Grandmother   . Hypertension Paternal Grandmother   . Heart disease Paternal Grandfather   . Hyperlipidemia Paternal Grandfather   . Mental illness Paternal Grandfather   . Rheum arthritis Sister   . Breast cancer Maternal Aunt   . Thyroid cancer Paternal Aunt     ROS: Review of Systems  Constitutional: Negative for weight loss.  Gastrointestinal: Negative for nausea and vomiting.  Genitourinary: Negative for frequency.  Musculoskeletal:       Negative muscle weakness  Endo/Heme/Allergies: Negative for polydipsia.       Negative hypoglycemia    PHYSICAL EXAM: Blood pressure 114/77, pulse 83, temperature 97.9 F (36.6 C), temperature source Oral, height 5\' 5"  (1.651 m), weight 263 lb (119.3 kg), SpO2 91 %. Body mass index is 43.77 kg/m. Physical Exam  Constitutional: She is oriented to person, place, and time. She  appears well-developed and well-nourished.  Cardiovascular: Normal rate.  Pulmonary/Chest: Effort normal.  Musculoskeletal: Normal range of motion.  Neurological: She is oriented to person, place, and time.  Skin: Skin is warm and dry.  Psychiatric: She has a normal mood and affect. Her behavior is normal.  Vitals reviewed.   RECENT LABS AND TESTS: BMET    Component Value Date/Time   NA 138 01/05/2018 0915   NA 141 10/07/2017 1255   K 4.2 01/05/2018 0915   CL 107 01/05/2018 0915   CO2 23 01/05/2018 0915   GLUCOSE 97 01/05/2018 0915   BUN 17  01/05/2018 0915   BUN 18 10/07/2017 1255   CREATININE 0.93 01/05/2018 0915   CREATININE 0.95 06/27/2016 0933   CALCIUM 8.9 01/05/2018 0915   GFRNONAA >60 01/05/2018 0915   GFRAA >60 01/05/2018 0915   Lab Results  Component Value Date   HGBA1C 5.4 02/10/2018   HGBA1C 5.5 10/07/2017   HGBA1C 5.1 06/22/2017   HGBA1C 5.3 01/11/2017   HGBA1C 5.7 (H) 08/31/2016   Lab Results  Component Value Date   INSULIN 20.3 02/10/2018   INSULIN 15.6 10/07/2017   INSULIN 25.2 (H) 06/22/2017   INSULIN 17.0 01/11/2017   INSULIN 17.4 08/31/2016   CBC    Component Value Date/Time   WBC 5.5 01/05/2018 0915   RBC 4.02 01/05/2018 0915   HGB 11.1 (L) 01/05/2018 0915   HGB 11.2 06/22/2017 0904   HCT 35.4 (L) 01/05/2018 0915   HCT 34.8 06/22/2017 0904   PLT 285 01/05/2018 0915   PLT 332 11/12/2016 1651   MCV 88.1 01/05/2018 0915   MCV 86 06/22/2017 0904   MCH 27.6 01/05/2018 0915   MCHC 31.4 01/05/2018 0915   RDW 14.4 01/05/2018 0915   RDW 15.7 (H) 06/22/2017 0904   LYMPHSABS 1.5 06/22/2017 0904   MONOABS 891 04/30/2016 0947   EOSABS 0.2 06/22/2017 0904   BASOSABS 0.0 06/22/2017 0904   Iron/TIBC/Ferritin/ %Sat    Component Value Date/Time   IRON 53 08/31/2016 1644   TIBC 280 08/31/2016 1644   FERRITIN 36 08/31/2016 1644   IRONPCTSAT 19 08/31/2016 1644   Lipid Panel     Component Value Date/Time   CHOL 133 02/10/2018 0749   TRIG 199  (H) 02/10/2018 0749   HDL 38 (L) 02/10/2018 0749   CHOLHDL 3.5 02/10/2018 0749   CHOLHDL 4.1 12/01/2015 0914   VLDL 37 (H) 12/01/2015 0914   LDLCALC 55 02/10/2018 0749   Hepatic Function Panel     Component Value Date/Time   PROT 7.9 01/05/2018 0915   PROT 7.3 10/07/2017 1255   ALBUMIN 3.5 01/05/2018 0915   ALBUMIN 4.0 10/07/2017 1255   AST 20 01/05/2018 0915   ALT 18 01/05/2018 0915   ALKPHOS 75 01/05/2018 0915   BILITOT 0.5 01/05/2018 0915   BILITOT <0.2 10/07/2017 1255      Component Value Date/Time   TSH 1.620 08/31/2016 1644   TSH 1.83 12/01/2015 0914  Results for VALOR, [place] (MRN [id number]) as of 05/03/2018 15:54  Ref. Range 02/10/2018 07:49  Vitamin D, 25-Hydroxy Latest Ref Range: 30.0 - 100.0 ng/mL 58.5    ASSESSMENT AND PLAN: Prediabetes - Plan: metFORMIN (GLUCOPHAGE) 1000 MG tablet, Dulaglutide (TRULICITY) 3.38 SN/0.5LZ SOPN  At risk for diabetes mellitus  Class 3 severe obesity with serious comorbidity and body mass index (BMI) of 40.0 to 44.9 in adult, unspecified obesity type [facility])  PLAN:  Pre-Diabetes Taneasha will continue to work on weight loss, exercise, and decreasing simple carbohydrates in her diet to help decrease the risk of diabetes. We dicussed metformin including benefits and risks. She was informed that eating too many simple carbohydrates or too many calories at one sitting increases the likelihood of GI side effects. Orpah agrees to continue Trulicity 7.67 mg SubQ q 7 days #3 pens and we will refill for 1 month, and she agrees to continue taking metformin 1,000 mg PO q AM #30 and we will refill for 1 month. Charika agrees to follow up with our clinic in 3 weeks as directed to monitor her progress and we will check labs at  that time.  Diabetes risk counselling Joniyah was given extended (30 minutes) diabetes prevention counseling today. She is 42 y.o. female and has risk factors for diabetes including obesity and pre-diabetes. We discussed  intensive lifestyle modifications today with an emphasis on weight loss as well as increasing exercise and decreasing simple carbohydrates in her diet.  Vitamin D Deficiency Tiffnay was informed that low vitamin D levels contributes to fatigue and are associated with obesity, breast, and colon cancer. Adalyne agrees to continue taking OTC Vit D 5,000 IU daily and will follow up for routine testing of vitamin D, at least 2-3 times per year. She was informed of the risk of over-replacement of vitamin D and agrees to not increase her dose unless she discusses this with [name] first. Caria agrees to follow up with our clinic in 3 weeks.  Obesity Adriyana is currently in the action stage of change. As such, her goal is to continue with weight loss efforts She has agreed to portion control better and make smarter food choices, such as increase vegetables and decrease simple carbohydrates  Davetta has been instructed to work up to a goal of 150 minutes of combined cardio and strengthening exercise per week for weight loss and overall health benefits. We discussed the following Behavioral Modification Strategies today: increasing lean protein intake, increasing vegetables, work on meal planning and easy cooking plans, better snacking choices, and planning for success   Clarity has agreed to follow up with our clinic in 3 weeks. She was informed of the importance of frequent follow up visits to maximize her success with intensive lifestyle modifications for her multiple health conditions.   OBESITY BEHAVIORAL INTERVENTION VISIT  Today's visit was # 55.   Starting weight: 278 lbs Starting date: 08/31/16 Today's weight : 263 lbs  Today's date: 05/03/2018 Total lbs lost to date: 15 At least 15 minutes were spent on discussing the following behavioral intervention visit.   ASK: We discussed the diagnosis of obesity with Marjie Skiff Zakarian today and Janett agreed to give [name] permission to discuss obesity  behavioral modification therapy today.  ASSESS: Jamala has the diagnosis of obesity and her BMI today is 43.77 Tarisa is in the action stage of change   ADVISE: Cayley was educated on the multiple health risks of obesity as well as the benefit of weight loss to improve her health. She was advised of the need for long term treatment and the importance of lifestyle modifications to improve her current health and to decrease her risk of future health problems.  AGREE: Multiple dietary modification options and treatment options were discussed and  Suda agreed to follow the recommendations documented in the above note.  ARRANGE: Kianni was educated on the importance of frequent visits to treat obesity as outlined per CMS and USPSTF guidelines and agreed to schedule her next follow up appointment today.  I, Trixie Dredge, am acting as transcriptionist for Ilene Qua, MD  I have reviewed the above documentation for accuracy and completeness, and I agree with the above. - Ilene Qua, MD

## 2018-05-05 ENCOUNTER — Ambulatory Visit: Payer: 59 | Admitting: Obstetrics and Gynecology

## 2018-05-05 ENCOUNTER — Other Ambulatory Visit: Payer: Self-pay

## 2018-05-05 ENCOUNTER — Encounter: Payer: Self-pay | Admitting: Obstetrics and Gynecology

## 2018-05-05 VITALS — BP 118/68 | HR 96 | Wt 266.8 lb

## 2018-05-05 DIAGNOSIS — H40013 Open angle with borderline findings, low risk, bilateral: Secondary | ICD-10-CM | POA: Diagnosis not present

## 2018-05-05 DIAGNOSIS — H04123 Dry eye syndrome of bilateral lacrimal glands: Secondary | ICD-10-CM | POA: Diagnosis not present

## 2018-05-05 DIAGNOSIS — Z30431 Encounter for routine checking of intrauterine contraceptive device: Secondary | ICD-10-CM | POA: Diagnosis not present

## 2018-05-05 DIAGNOSIS — H5509 Other forms of nystagmus: Secondary | ICD-10-CM | POA: Diagnosis not present

## 2018-05-05 DIAGNOSIS — H40053 Ocular hypertension, bilateral: Secondary | ICD-10-CM | POA: Diagnosis not present

## 2018-05-05 LAB — HM DIABETES EYE EXAM

## 2018-05-05 NOTE — Progress Notes (Signed)
GYNECOLOGY  VISIT   HPI: 42 y.o.   Married  Caucasian  female   G3P1000 with No LMP recorded. (Menstrual status: IUD).   here for 1 month  Mirena IUD recheck. She had mirena removal and reinsertion last month. Doing well, no concerns. Son is starting 6th grade today, going to a [employer], has had issues with Bullies.   GYNECOLOGIC HISTORY: No LMP recorded. (Menstrual status: IUD). Contraception:Mirena IUD Menopausal hormone therapy: None       OB History    Gravida  3   Para  1   Term  1   Preterm  0   AB  0   Living  0     SAB  0   TAB  0   Ectopic  0   Multiple  0   Live Births  0              Patient Active Problem List   Diagnosis Date Noted  . Insulin resistance 10/21/2017  . Other hyperlipidemia 10/07/2017  . Vitamin D deficiency 10/07/2017  . Bipolar depression ([place]) 04/27/2017  . Class 3 obesity with serious comorbidity and body mass index (BMI) of 40.0 to 44.9 in adult 04/27/2017  . Residual foreign body in soft tissue 11/12/2016  . Prediabetes 11/02/2016  . Morbid obesity ([place]) 10/15/2016  . Bipolar 1 disorder ([place])     Past Medical History:  Diagnosis Date  . Allergy    Zyrtec, Allegra.  . Anxiety    followed by Dr. Toy Cookey  . Back pain   . Bipolar 1 disorder ([place])   . Complication of anesthesia   . Depression   . Fibroid   . Gallbladder problem   . Gastroparesis 09/14/2012   gastric emptying study in 2014  . GERD (gastroesophageal reflux disease)   . Pneumonia    2013ish  . PONV (postoperative nausea and vomiting)   . Pre-diabetes   . PTSD (post-traumatic stress disorder)   . Vitamin D deficiency     Past Surgical History:  Procedure Laterality Date  . ANKLE ARTHROSCOPY Left 2011  . CESAREAN SECTION    . CHOLECYSTECTOMY    . DILATION AND CURETTAGE OF UTERUS    . FOREIGN BODY REMOVAL Left 11/18/2016   Procedure: REMOVAL FOREIGN BODY EXTREMITY LEFT FOOT;  Surgeon: Trula Slade, DPM;  Location: [place];  Service:  Podiatry;  Laterality: Left;  . PILONIDAL CYST EXCISION  1990's  . TENDON REPAIR Left 2011   Left Ankle  . WISDOM TOOTH EXTRACTION  19090's    Current Outpatient Medications  Medication Sig Dispense Refill  . Cholecalciferol (VITAMIN D3) 5000 units CAPS Take 5,000 Units by mouth at bedtime.    . clonazePAM (KLONOPIN) 0.5 MG tablet Take 1.5 mg by mouth at bedtime.    . Dulaglutide (TRULICITY) 5.36 UY/4.0HK SOPN Inject 0.75 mg into the skin every 7 (seven) days. 3 pen 0  . L-methylfolate Calcium 15 MG TABS Take 15 mg by mouth at bedtime.     . lamoTRIgine (LAMICTAL) 200 MG tablet Take 200 mg by mouth at bedtime.    [name] levonorgestrel (MIRENA) 20 MCG/24HR IUD 1 each by Intrauterine route once. Place 03/2013    . loratadine (CLARITIN) 10 MG tablet Take 10 mg by mouth at bedtime.     . metFORMIN (GLUCOPHAGE) 1000 MG tablet Take 1 tablet (1,000 mg total) by mouth daily with breakfast. 30 tablet 0  . Multiple Vitamin (MULTIVITAMIN) tablet Take 1 tablet by mouth  at bedtime.     . pantoprazole (PROTONIX) 40 MG tablet Take 40 mg by mouth daily.    . propranolol (INDERAL) 40 MG tablet Take 40 mg by mouth 2 (two) times daily.     . QUEtiapine (SEROQUEL) 200 MG tablet Take 100 mg by mouth at bedtime.     No current facility-administered medications for this visit.      ALLERGIES: Penicillins; Tamiflu [oseltamivir phosphate]; Cephalosporins; and Latex  Family History  Problem Relation Age of Onset  . Cancer Mother        squamous cell carcinoma  . Hyperlipidemia Mother   . Depression Mother   . Anxiety disorder Mother   . Obesity Mother   . Heart disease Father 45       cardiomegaly, CHF; steroid use.  [name] Hyperlipidemia Father   . Hypertension Father   . Mental retardation Father   . High blood pressure Father   . Depression Father   . Anxiety disorder Father   . Obesity Father   . Cancer Maternal Grandmother   . Diabetes Maternal Grandmother   . Heart disease Maternal Grandmother   .  Hyperlipidemia Maternal Grandmother   . Hypertension Maternal Grandmother   . Stroke Maternal Grandmother   . Heart disease Paternal Grandmother   . Hypertension Paternal Grandmother   . Heart disease Paternal Grandfather   . Hyperlipidemia Paternal Grandfather   . Mental illness Paternal Grandfather   . Rheum arthritis Sister   . Breast cancer Maternal Aunt   . Thyroid cancer Paternal Aunt     Social History   Socioeconomic History  . Marital status: Married    Spouse name: Not on file  . Number of children: Not on file  . Years of education: Not on file  . Highest education level: Not on file  Occupational History  . Occupation: Programmer, multimedia: [employer]  . Financial resource strain: Not on file  . Food insecurity:    Worry: Not on file    Inability: Not on file  . Transportation needs:    Medical: Not on file    Non-medical: Not on file  Tobacco Use  . Smoking status: Former Smoker    Years: 10.00    Types: Cigarettes    Last attempt to quit: 2003    Years since quitting: 16.6  . Smokeless tobacco: Never Used  Substance and Sexual Activity  . Alcohol use: Yes    Alcohol/week: 0.0 standard drinks    Comment: 1-2 per month   . Drug use: No  . Sexual activity: Yes    Partners: Male    Birth control/protection: IUD    Comment: Mirena  Lifestyle  . Physical activity:    Days per week: Not on file    Minutes per session: Not on file  . Stress: Not on file  Relationships  . Social connections:    Talks on phone: Not on file    Gets together: Not on file    Attends religious service: Not on file    Active member of club or organization: Not on file    Attends meetings of clubs or organizations: Not on file    Relationship status: Not on file  . Intimate partner violence:    Fear of current or ex partner: Not on file    Emotionally abused: Not on file    Physically abused: Not on file    Forced sexual activity: Not on file  Other Topics  Concern  . 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      Seatbelt: 100%; no texting while driving often        Review of Systems  Constitutional: Negative.   HENT: Negative.   Eyes: Negative.   Respiratory: Negative.   Cardiovascular: Negative.   Gastrointestinal: Negative.   Genitourinary: Negative.   Musculoskeletal: Negative.   Skin: Negative.   Neurological: Negative.   Endo/Heme/Allergies: Negative.   Psychiatric/Behavioral: Negative.     PHYSICAL EXAMINATION:    BP 118/68 (BP Location: Right Arm, Patient Position: Sitting, Cuff Size: Large)   Pulse 96   Wt 266 lb 12.8 oz (121 kg)   BMI 44.40 kg/m     General appearance: alert, cooperative and appears stated age  Pelvic: External genitalia:  no lesions              Urethra:  normal appearing urethra with no masses, tenderness or lesions              Bartholins and Skenes: normal                 Vagina: normal appearing vagina with normal color and discharge, no lesions              Cervix: no lesions and IUD string 3 cm              Bimanual Exam:  Uterus:  normal size, contour, position, consistency, mobility, non-tender              Adnexa: no mass, fullness, tenderness                Chaperone was present for exam.  ASSESSMENT IUD check, doing well    PLAN Routine f/u   An After Visit Summary was printed and given to the patient.

## 2018-05-10 MED FILL — PANTOPRAZOLE SOD DR 40 MG T: 40 | 30 days supply | Qty: 30 | Fill #1

## 2018-05-16 DIAGNOSIS — F4322 Adjustment disorder with anxiety: Secondary | ICD-10-CM | POA: Diagnosis not present

## 2018-05-17 ENCOUNTER — Ambulatory Visit: Payer: 59 | Admitting: Obstetrics and Gynecology

## 2018-05-17 ENCOUNTER — Encounter: Payer: Self-pay | Admitting: Obstetrics and Gynecology

## 2018-05-17 ENCOUNTER — Telehealth: Payer: Self-pay | Admitting: Obstetrics and Gynecology

## 2018-05-17 ENCOUNTER — Other Ambulatory Visit: Payer: Self-pay

## 2018-05-17 VITALS — BP 130/70 | HR 78 | Resp 16 | Ht 65.0 in | Wt 266.4 lb

## 2018-05-17 DIAGNOSIS — N61 Mastitis without abscess: Secondary | ICD-10-CM

## 2018-05-17 DIAGNOSIS — N644 Mastodynia: Secondary | ICD-10-CM

## 2018-05-17 MED ORDER — NONFORMULARY OR COMPOUNDED ITEM: 0 refills | Status: DC

## 2018-05-17 MED ORDER — VALACYCLOVIR HCL 1 G PO TABS: 1000.0000 mg | ORAL_TABLET | Freq: Two times a day (BID) | ORAL | 0 refills | Status: DC

## 2018-05-17 MED FILL — valACYclovir HCL 1 GM TABS: 1 | 7 days supply | Qty: 14 | Fill #0

## 2018-05-17 NOTE — Telephone Encounter (Signed)
Message left to return call to [place] at [phone] at work number.

## 2018-05-17 NOTE — Progress Notes (Signed)
GYNECOLOGY  VISIT   HPI: 42 y.o.   Married  Caucasian  female   G3P1000 with No LMP recorded. (Menstrual status: IUD).  here for   Breast pain, itching and redness. Started hurting on Thursday on her right nipple. It has gotten so sore and tender, she can't let anything tough her nipple. The nipple pain is shooting back, very itchy. It didn't blister. She though maybe she had a bug bite, she used steroids for 1-2 days, then switched to antibiotic ointment.  Last mammogram was in 4/17. She has one scheduled in 2 days.  Husband with h/o oral hsv.   GYNECOLOGIC HISTORY: No LMP recorded. (Menstrual status: IUD). Contraception: IUD  Menopausal hormone therapy: none         OB History    Gravida  3   Para  1   Term  1   Preterm  0   AB  0   Living  0     SAB  0   TAB  0   Ectopic  0   Multiple  0   Live Births  0              Patient Active Problem List   Diagnosis Date Noted  . Insulin resistance 10/21/2017  . Other hyperlipidemia 10/07/2017  . Vitamin D deficiency 10/07/2017  . Bipolar depression ([place]) 04/27/2017  . Class 3 obesity with serious comorbidity and body mass index (BMI) of 40.0 to 44.9 in adult 04/27/2017  . Residual foreign body in soft tissue 11/12/2016  . Prediabetes 11/02/2016  . Morbid obesity ([place]) 10/15/2016  . Bipolar 1 disorder ([place])     Past Medical History:  Diagnosis Date  . Allergy    Zyrtec, Allegra.  . Anxiety    followed by Dr. Toy Cookey  . Back pain   . Bipolar 1 disorder ([place])   . Complication of anesthesia   . Depression   . Fibroid   . Gallbladder problem   . Gastroparesis 09/14/2012   gastric emptying study in 2014  . GERD (gastroesophageal reflux disease)   . Pneumonia    2013ish  . PONV (postoperative nausea and vomiting)   . Pre-diabetes   . PTSD (post-traumatic stress disorder)   . Vitamin D deficiency     Past Surgical History:  Procedure Laterality Date  . ANKLE ARTHROSCOPY Left 2011  . CESAREAN SECTION     . CHOLECYSTECTOMY    . DILATION AND CURETTAGE OF UTERUS    . FOREIGN BODY REMOVAL Left 11/18/2016   Procedure: REMOVAL FOREIGN BODY EXTREMITY LEFT FOOT;  Surgeon: Trula Slade, DPM;  Location: [facility];  Service: Podiatry;  Laterality: Left;  . PILONIDAL CYST EXCISION  1990's  . TENDON REPAIR Left 2011   Left Ankle  . WISDOM TOOTH EXTRACTION  19090's    Current Outpatient Medications  Medication Sig Dispense Refill  . Cholecalciferol (VITAMIN D3) 5000 units CAPS Take 5,000 Units by mouth at bedtime.    . clonazePAM (KLONOPIN) 0.5 MG tablet Take 1.5 mg by mouth at bedtime.    . Dulaglutide (TRULICITY) 5.18 AC/1.6SA SOPN Inject 0.75 mg into the skin every 7 (seven) days. 3 pen 0  . L-methylfolate Calcium 15 MG TABS Take 15 mg by mouth at bedtime.     . lamoTRIgine (LAMICTAL) 200 MG tablet Take 200 mg by mouth at bedtime.    [name] levonorgestrel (MIRENA) 20 MCG/24HR IUD 1 each by Intrauterine route once. Place 03/2013    . loratadine (  CLARITIN) 10 MG tablet Take 10 mg by mouth at bedtime.     . metFORMIN (GLUCOPHAGE) 1000 MG tablet Take 1 tablet (1,000 mg total) by mouth daily with breakfast. 30 tablet 0  . Multiple Vitamin (MULTIVITAMIN) tablet Take 1 tablet by mouth at bedtime.     . pantoprazole (PROTONIX) 40 MG tablet Take 40 mg by mouth daily.    . propranolol (INDERAL) 40 MG tablet Take 40 mg by mouth 2 (two) times daily.     . QUEtiapine (SEROQUEL) 200 MG tablet Take 100 mg by mouth at bedtime.     No current facility-administered medications for this visit.      ALLERGIES: Penicillins; Tamiflu [oseltamivir phosphate]; Cephalosporins; and Latex  Family History  Problem Relation Age of Onset  . Cancer Mother        squamous cell carcinoma  . Hyperlipidemia Mother   . Depression Mother   . Anxiety disorder Mother   . Obesity Mother   . Heart disease Father 33       cardiomegaly, CHF; steroid use.  [name] Hyperlipidemia Father   . Hypertension Father   . Mental retardation Father    . High blood pressure Father   . Depression Father   . Anxiety disorder Father   . Obesity Father   . Cancer Maternal Grandmother   . Diabetes Maternal Grandmother   . Heart disease Maternal Grandmother   . Hyperlipidemia Maternal Grandmother   . Hypertension Maternal Grandmother   . Stroke Maternal Grandmother   . Heart disease Paternal Grandmother   . Hypertension Paternal Grandmother   . Heart disease Paternal Grandfather   . Hyperlipidemia Paternal Grandfather   . Mental illness Paternal Grandfather   . Rheum arthritis Sister   . Breast cancer Maternal Aunt   . Thyroid cancer Paternal Aunt     Social History   Socioeconomic History  . Marital status: Married    Spouse name: Not on file  . Number of children: Not on file  . Years of education: Not on file  . Highest education level: Not on file  Occupational History  . Occupation: Programmer, multimedia: [employer]  . Financial resource strain: Not on file  . Food insecurity:    Worry: Not on file    Inability: Not on file  . Transportation needs:    Medical: Not on file    Non-medical: Not on file  Tobacco Use  . Smoking status: Former Smoker    Years: 10.00    Types: Cigarettes    Last attempt to quit: 2003    Years since quitting: 16.6  . Smokeless tobacco: Never Used  Substance and Sexual Activity  . Alcohol use: Yes    Alcohol/week: 0.0 standard drinks    Comment: 1-2 per month   . Drug use: No  . Sexual activity: Yes    Partners: Male    Birth control/protection: IUD    Comment: Mirena  Lifestyle  . Physical activity:    Days per week: Not on file    Minutes per session: Not on file  . Stress: Not on file  Relationships  . Social connections:    Talks on phone: Not on file    Gets together: Not on file    Attends religious service: Not on file    Active member of club or organization: Not on file    Attends meetings of clubs or organizations: Not on file    Relationship  status: Not  on file  . Intimate partner violence:    Fear of current or ex partner: Not on file    Emotionally abused: Not on file    Physically abused: Not on file    Forced sexual activity: Not on file  Other Topics Concern  . 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      Seatbelt: 100%; no texting while driving often        Review of Systems  Constitutional: Negative.   HENT: Negative.   Eyes: Negative.   Respiratory: Negative.   Cardiovascular: Negative.   Gastrointestinal: Negative.   Genitourinary:       Breast pain Breast Itching Nipple swelling   Musculoskeletal: Negative.   Skin: Negative.   Neurological: Negative.   Endo/Heme/Allergies: Negative.   Psychiatric/Behavioral: Negative.     PHYSICAL EXAMINATION:    BP 130/70 (BP Location: Right Arm, Patient Position: Sitting, Cuff Size: Large)   Pulse 78   Resp 16   Ht 5\' 5"  (1.651 m)   Wt 266 lb 6.4 oz (120.8 kg)   BMI 44.33 kg/m     General appearance: alert, cooperative and appears stated age Breasts: no masses, the right nipple is erythematous with some ulceration. No erythema on the rest of the breast or the areolar region.  No axillary or supraclavicular adenopathy  ASSESSMENT Erythematous right nipple with slight ulceration, suspicious for possible hsv outbreak (husband with h/o oral hsv)    PLAN HSV culture Yeast culture Treat with Valtrex Will speak with compounding pharmacist about nipple ointment She will change her mammogram appointment, too tender to have a mammogram right now   An After Visit Summary was printed and given to the patient.   I spoke with the compounding  pharmacist at [facility] and called in the following: 2% miconazole, 2%mupiracin, 2%ibuprofen ointment  30 grams, no refills. Apply tid prn

## 2018-05-17 NOTE — Telephone Encounter (Signed)
Breast pain x 3 days. Now itching and red with nipple changes.  Office visit today with Dr. Talbert Nan at [phone]. Encounter closed.

## 2018-05-17 NOTE — Telephone Encounter (Signed)
Patient believes she may have mastitis after talking with her PCP's office. Patient was told to follow up with her GYN.

## 2018-05-18 ENCOUNTER — Telehealth: Payer: Self-pay | Admitting: Emergency Medicine

## 2018-05-18 DIAGNOSIS — F4310 Post-traumatic stress disorder, unspecified: Secondary | ICD-10-CM | POA: Diagnosis not present

## 2018-05-18 MED FILL — lamoTRIgine 100 MG TABS: 100 | 90 days supply | Qty: 90 | Fill #0

## 2018-05-18 MED FILL — PRAZOSIN 1 MG CAPSULE: 1 | 30 days supply | Qty: 90 | Fill #0

## 2018-05-18 NOTE — Telephone Encounter (Signed)
Patient returned call. Compounding Rx not ready until today. She will pick up later. Not feeling worse, not much better, but using heating pad helps.   She states she will call back tomorrow if she's not improved after using compounded cream.

## 2018-05-18 NOTE — Telephone Encounter (Signed)
-----   Message from Salvadore Dom, MD sent at 05/18/2018  9:41 AM EDT ----- The patient was seen yesterday with ulcerations/inflammation of her left nipple. Cultures are still pending, valtrex and a compounded ointment were called in for her. Please check if she is feeling any better.

## 2018-05-18 NOTE — Telephone Encounter (Signed)
Message left to return call to [place] at [phone] on  Telephone Information:  Mobile [phone]

## 2018-05-19 ENCOUNTER — Telehealth: Payer: Self-pay | Admitting: Obstetrics and Gynecology

## 2018-05-19 ENCOUNTER — Ambulatory Visit: Payer: 59 | Admitting: Obstetrics and Gynecology

## 2018-05-19 ENCOUNTER — Encounter: Payer: Self-pay | Admitting: Obstetrics and Gynecology

## 2018-05-19 ENCOUNTER — Ambulatory Visit: Payer: 59

## 2018-05-19 VITALS — BP 116/80 | HR 81 | Ht 65.0 in | Wt 266.0 lb

## 2018-05-19 DIAGNOSIS — L299 Pruritus, unspecified: Secondary | ICD-10-CM | POA: Diagnosis not present

## 2018-05-19 DIAGNOSIS — N61 Mastitis without abscess: Secondary | ICD-10-CM | POA: Diagnosis not present

## 2018-05-19 LAB — HERPES SIMPLEX VIRUS CULTURE

## 2018-05-19 MED ORDER — BETAMETHASONE VALERATE 0.1 % EX OINT: TOPICAL_OINTMENT | CUTANEOUS | 0 refills | Status: DC

## 2018-05-19 MED ORDER — FLUCONAZOLE 150 MG PO TABS: ORAL_TABLET | ORAL | 0 refills | Status: DC

## 2018-05-19 MED FILL — BETAMETHASONE VALER 0.1% OI: 0.1 | 7 days supply | Qty: 15 | Fill #0

## 2018-05-19 MED FILL — FLUCONAZOLE 150 MG TABS: 150 | 3 days supply | Qty: 2 | Fill #0

## 2018-05-19 NOTE — Telephone Encounter (Signed)
Patient states she is returning Caroline Gonzales's call from yesterday. No open phone note.

## 2018-05-19 NOTE — Progress Notes (Signed)
GYNECOLOGY  VISIT   HPI: 42 y.o.   Married  Caucasian  female   G3P1000 with No LMP recorded. (Menstrual status: IUD).   here for breast recheck.  She was seen 2 days ago with severe right nipple pain and ulceration of just the nipple. HSV and yeast cultures were sent, still pending. She was started on Valtrex and a compounded nipple ointment with an antibiotic, antifungal and ibuprofen in it. She is feeling a little better, pain is down from a 7/10 to a 4/10 in severity, but the itching is making her crazy.  GYNECOLOGIC HISTORY: No LMP recorded. (Menstrual status: IUD). Contraception:Mirena IUD Menopausal hormone therapy: n/a        OB History    Gravida  3   Para  1   Term  1   Preterm  0   AB  0   Living  0     SAB  0   TAB  0   Ectopic  0   Multiple  0   Live Births  0              Patient Active Problem List   Diagnosis Date Noted  . Insulin resistance 10/21/2017  . Other hyperlipidemia 10/07/2017  . Vitamin D deficiency 10/07/2017  . Bipolar depression ([place]) 04/27/2017  . Class 3 obesity with serious comorbidity and body mass index (BMI) of 40.0 to 44.9 in adult 04/27/2017  . Residual foreign body in soft tissue 11/12/2016  . Prediabetes 11/02/2016  . Morbid obesity ([place]) 10/15/2016  . Bipolar 1 disorder ([place])     Past Medical History:  Diagnosis Date  . Allergy    Zyrtec, Allegra.  . Anxiety    followed by Dr. Toy Cookey  . Back pain   . Bipolar 1 disorder ([place])   . Complication of anesthesia   . Depression   . Fibroid   . Gallbladder problem   . Gastroparesis 09/14/2012   gastric emptying study in 2014  . GERD (gastroesophageal reflux disease)   . Pneumonia    2013ish  . PONV (postoperative nausea and vomiting)   . Pre-diabetes   . PTSD (post-traumatic stress disorder)   . Vitamin D deficiency     Past Surgical History:  Procedure Laterality Date  . ANKLE ARTHROSCOPY Left 2011  . CESAREAN SECTION    . CHOLECYSTECTOMY    . DILATION  AND CURETTAGE OF UTERUS    . FOREIGN BODY REMOVAL Left 11/18/2016   Procedure: REMOVAL FOREIGN BODY EXTREMITY LEFT FOOT;  Surgeon: Trula Slade, DPM;  Location: [facility];  Service: Podiatry;  Laterality: Left;  . PILONIDAL CYST EXCISION  1990's  . TENDON REPAIR Left 2011   Left Ankle  . WISDOM TOOTH EXTRACTION  19090's    Current Outpatient Medications  Medication Sig Dispense Refill  . Cholecalciferol (VITAMIN D3) 5000 units CAPS Take 5,000 Units by mouth at bedtime.    . clonazePAM (KLONOPIN) 0.5 MG tablet Take 1.5 mg by mouth at bedtime.    . Dulaglutide (TRULICITY) 2.45 YK/9.9IP SOPN Inject 0.75 mg into the skin every 7 (seven) days. 3 pen 0  . L-methylfolate Calcium 15 MG TABS Take 15 mg by mouth at bedtime.     . lamoTRIgine (LAMICTAL) 200 MG tablet Take 200 mg by mouth at bedtime.    [name] levonorgestrel (MIRENA) 20 MCG/24HR IUD 1 each by Intrauterine route once. Place 03/2013    . loratadine (CLARITIN) 10 MG tablet Take 10 mg by mouth at  bedtime.     . metFORMIN (GLUCOPHAGE) 1000 MG tablet Take 1 tablet (1,000 mg total) by mouth daily with breakfast. 30 tablet 0  . Multiple Vitamin (MULTIVITAMIN) tablet Take 1 tablet by mouth at bedtime.     . NONFORMULARY OR COMPOUNDED ITEM 2% miconazole, 2%mupiracin, 2%ibuprofen ointment  30 grams, no refills. Apply tid prn 30 each 0  . pantoprazole (PROTONIX) 40 MG tablet Take 40 mg by mouth daily.    . propranolol (INDERAL) 40 MG tablet Take 40 mg by mouth 2 (two) times daily.     . QUEtiapine (SEROQUEL) 200 MG tablet Take 100 mg by mouth at bedtime.    . valACYclovir (VALTREX) 1000 MG tablet Take 1 tablet (1,000 mg total) by mouth 2 (two) times daily. 14 tablet 0   No current facility-administered medications for this visit.      ALLERGIES: Penicillins; Tamiflu [oseltamivir phosphate]; Cephalosporins; and Latex  Family History  Problem Relation Age of Onset  . Cancer Mother        squamous cell carcinoma  . Hyperlipidemia Mother   .  Depression Mother   . Anxiety disorder Mother   . Obesity Mother   . Heart disease Father 94       cardiomegaly, CHF; steroid use.  [name] Hyperlipidemia Father   . Hypertension Father   . Mental retardation Father   . High blood pressure Father   . Depression Father   . Anxiety disorder Father   . Obesity Father   . Cancer Maternal Grandmother   . Diabetes Maternal Grandmother   . Heart disease Maternal Grandmother   . Hyperlipidemia Maternal Grandmother   . Hypertension Maternal Grandmother   . Stroke Maternal Grandmother   . Heart disease Paternal Grandmother   . Hypertension Paternal Grandmother   . Heart disease Paternal Grandfather   . Hyperlipidemia Paternal Grandfather   . Mental illness Paternal Grandfather   . Rheum arthritis Sister   . Breast cancer Maternal Aunt   . Thyroid cancer Paternal Aunt     Social History   Socioeconomic History  . Marital status: Married    Spouse name: Not on file  . Number of children: Not on file  . Years of education: Not on file  . Highest education level: Not on file  Occupational History  . Occupation: Programmer, multimedia: [employer]  . Financial resource strain: Not on file  . Food insecurity:    Worry: Not on file    Inability: Not on file  . Transportation needs:    Medical: Not on file    Non-medical: Not on file  Tobacco Use  . Smoking status: Former Smoker    Years: 10.00    Types: Cigarettes    Last attempt to quit: 2003    Years since quitting: 16.6  . Smokeless tobacco: Never Used  Substance and Sexual Activity  . Alcohol use: Yes    Alcohol/week: 0.0 standard drinks    Comment: 1-2 per month   . Drug use: No  . Sexual activity: Yes    Partners: Male    Birth control/protection: IUD    Comment: Mirena  Lifestyle  . Physical activity:    Days per week: Not on file    Minutes per session: Not on file  . Stress: Not on file  Relationships  . Social connections:    Talks on phone: Not on file     Gets together: Not on file  Attends religious service: Not on file    Active member of club or organization: Not on file    Attends meetings of clubs or organizations: Not on file    Relationship status: Not on file  . Intimate partner violence:    Fear of current or ex partner: Not on file    Emotionally abused: Not on file    Physically abused: Not on file    Forced sexual activity: Not on file  Other Topics Concern  . 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      Seatbelt: 100%; no texting while driving often        Review of Systems  Constitutional: Negative.   HENT: Negative.   Eyes: Negative.   Respiratory: Negative.   Cardiovascular: Negative.   Gastrointestinal: Negative.   Genitourinary: Negative.   Musculoskeletal: Negative.   Skin:       Breast pain  Neurological: Negative.   Endo/Heme/Allergies: Negative.   Psychiatric/Behavioral: Negative.   All other systems reviewed and are negative.   PHYSICAL EXAMINATION:    There were no vitals taken for this visit.    General appearance: alert, cooperative and appears stated age Breasts: the right nipple is erythematous, but less than it was 2 days ago. The ulceration has scabbed over, the edge of the areolar region laterally on the right is slightly indurated (suspect from scratching). No erythema on the skin. No lumps or skin dimpling on either side.    ASSESSMENT Focal nipple pain, erythema, pruritus, and ulceration. HSV and yeast cultures are pending. She is on Valtrex and compounded nipple ointment with antifungal, antibiotic and Ibuprofen. Her pain has improved some, but the itching  is severe. Ulceration is healing     PLAN Continue current medication Will add steroid ointment and diflucan F/U next week Will need breast imaging Call with worsening symptoms   An After Visit Summary was printed and given to the patient.

## 2018-05-19 NOTE — Telephone Encounter (Signed)
Patient calling to follow up with Breast Pain. Needs office visit if not improved.    Pt returned call and has been using compounded cream x 24 hours.  Reports skin is still "really itchy" and red and inflamed. Office visit today with Dr. Talbert Nan for breast check.

## 2018-05-20 DIAGNOSIS — F4310 Post-traumatic stress disorder, unspecified: Secondary | ICD-10-CM | POA: Diagnosis not present

## 2018-05-23 MED FILL — clonazePAM 0.5 MG TABS: 0.5 | 30 days supply | Qty: 120 | Fill #3

## 2018-05-25 ENCOUNTER — Ambulatory Visit (INDEPENDENT_AMBULATORY_CARE_PROVIDER_SITE_OTHER): Payer: 59 | Admitting: Family Medicine

## 2018-05-25 ENCOUNTER — Other Ambulatory Visit (INDEPENDENT_AMBULATORY_CARE_PROVIDER_SITE_OTHER): Payer: Self-pay | Admitting: Family Medicine

## 2018-05-25 VITALS — BP 112/73 | HR 85 | Temp 97.5°F | Ht 65.0 in | Wt 262.0 lb

## 2018-05-25 DIAGNOSIS — E559 Vitamin D deficiency, unspecified: Secondary | ICD-10-CM

## 2018-05-25 DIAGNOSIS — F322 Major depressive disorder, single episode, severe without psychotic features: Secondary | ICD-10-CM | POA: Diagnosis not present

## 2018-05-25 DIAGNOSIS — R7303 Prediabetes: Secondary | ICD-10-CM | POA: Diagnosis not present

## 2018-05-25 DIAGNOSIS — E7849 Other hyperlipidemia: Secondary | ICD-10-CM | POA: Diagnosis not present

## 2018-05-25 DIAGNOSIS — Z6841 Body Mass Index (BMI) 40.0 and over, adult: Secondary | ICD-10-CM

## 2018-05-25 DIAGNOSIS — Z9189 Other specified personal risk factors, not elsewhere classified: Secondary | ICD-10-CM | POA: Diagnosis not present

## 2018-05-25 LAB — YEAST ONLY, CULTURE

## 2018-05-25 NOTE — Progress Notes (Signed)
Office: [phone]  /  Fax: [phone]   HPI:   Chief Complaint: OBESITY Caroline Gonzales is here to discuss her progress with her obesity treatment plan. She is on the portion control better and make smarter food choices plan and is following her eating plan approximately 70 % of the time. She states she is walking 8,000 steps and exercising 5 minutes 5 times per week. Caroline Gonzales's psychiatrist placed her on FMLA. She is doing some emotional eating and she is just trying to make good choices. Her medications are being adjusted by her psychiatrist. Her weight is 262 lb (118.8 kg) today and has had a weight loss of 1 pound over a period of 3 weeks since her last visit. She has lost 16 lbs since starting treatment with [name].  Pre-Diabetes Caroline Gonzales has a diagnosis of prediabetes based on her elevated Hgb A1c and was informed this puts her at greater risk of developing diabetes. Caroline Gonzales is taking victoza and metformin currently and she continues to work on diet and exercise to decrease risk of diabetes. She denies any GI upset or hypoglycemia.  At risk for diabetes Caroline Gonzales is at higher than average risk for developing diabetes due to her obesity and prediabetes. She currently denies polyuria or polydipsia.  Severe Depression Melanies medications are being adjusted by her psychiatrist. She is seeking a new therapist. Caroline Gonzales struggles with emotional eating and using food for comfort to the extent that it is negatively impacting her health. She often snacks when she is not hungry. Caroline Gonzales sometimes feels she is out of control and then feels guilty that she made poor food choices. She has been working on behavior modification techniques to help reduce her emotional eating and has been somewhat successful. She shows no sign of suicidal or homicidal ideations.  Hyperlipidemia Caroline Gonzales has hyperlipidemia and she is not on statin. Caroline Gonzales has been trying to improve her cholesterol levels with intensive lifestyle  modification including a low saturated fat diet, exercise and weight loss. She denies any myalgias.  Vitamin D deficiency Caroline Gonzales has a diagnosis of vitamin D deficiency. She is currently taking vit D 5,000 IU daily. Caroline Gonzales admits to fatigue and denies nausea, vomiting or muscle weakness.  Depression screen [facility] 2/9 03/29/2018 02/02/2018 04/14/2017 11/12/2016 08/31/2016  Decreased Interest 0 0 0 0 1  Down, Depressed, Hopeless 1 0 0 0 3  PHQ - 2 Score 1 0 0 0 4  Altered sleeping 3 - - - 1  Tired, decreased energy 1 - - - 3  Change in appetite 3 - - - 3  Feeling bad or failure about yourself  2 - - - 3  Trouble concentrating 0 - - - 0  Moving slowly or fidgety/restless 0 - - - 2  Suicidal thoughts 0 - - - 1  PHQ-9 Score 10 - - - 17      ALLERGIES: Allergies  Allergen Reactions  . Penicillins Shortness Of Breath and Rash    Has patient had a PCN reaction causing immediate rash, facial/tongue/throat swelling, SOB or lightheadedness with hypotension: Yes Has patient had a PCN reaction causing severe rash involving mucus membranes or skin necrosis: Yes Has patient had a PCN reaction that required hospitalization: No Has patient had a PCN reaction occurring within the last 10 years: No If all of the above answers are "NO", then may proceed with Cephalosporin use.   . Tamiflu [Oseltamivir Phosphate] Anaphylaxis  . Cephalosporins Rash  . Latex Rash    MEDICATIONS: Current Outpatient Medications  on File Prior to Visit  Medication Sig Dispense Refill  . betamethasone valerate ointment (VALISONE) 0.1 % Use a pea sized amount BID for up to 7 days. 15 g 0  . Cholecalciferol (VITAMIN D3) 5000 units CAPS Take 5,000 Units by mouth at bedtime.    . clonazePAM (KLONOPIN) 0.5 MG tablet Take 1.5 mg by mouth at bedtime.    . Dulaglutide (TRULICITY) 0.10 UV/2.5DG SOPN Inject 0.75 mg into the skin every 7 (seven) days. 3 pen 0  . fluconazole (DIFLUCAN) 150 MG tablet Take one tablet.  Repeat in 72 hours  if symptoms are not completely resolved. 2 tablet 0  . L-methylfolate Calcium 15 MG TABS Take 15 mg by mouth at bedtime.     . lamoTRIgine (LAMICTAL PO) Take 250 mg by mouth daily.    [name] levonorgestrel (MIRENA) 20 MCG/24HR IUD 1 each by Intrauterine route once. Place 03/2013    . loratadine (CLARITIN) 10 MG tablet Take 10 mg by mouth at bedtime.     . metFORMIN (GLUCOPHAGE) 1000 MG tablet Take 1 tablet (1,000 mg total) by mouth daily with breakfast. 30 tablet 0  . Multiple Vitamin (MULTIVITAMIN) tablet Take 1 tablet by mouth at bedtime.     . NONFORMULARY OR COMPOUNDED ITEM 2% miconazole, 2%mupiracin, 2%ibuprofen ointment  30 grams, no refills. Apply tid prn 30 each 0  . pantoprazole (PROTONIX) 40 MG tablet Take 40 mg by mouth daily.    . prazosin (MINIPRESS) 1 MG capsule Take 1 mg by mouth at bedtime.    . propranolol (INDERAL) 40 MG tablet Take 40 mg by mouth 2 (two) times daily.     . QUEtiapine (SEROQUEL) 200 MG tablet Take 100 mg by mouth at bedtime.    . valACYclovir (VALTREX) 1000 MG tablet Take 1 tablet (1,000 mg total) by mouth 2 (two) times daily. 14 tablet 0   No current facility-administered medications on file prior to visit.     PAST MEDICAL HISTORY: Past Medical History:  Diagnosis Date  . Allergy    Zyrtec, Allegra.  . Anxiety    followed by Dr. Toy Cookey  . Back pain   . Bipolar 1 disorder ([place])   . Complication of anesthesia   . Depression   . Fibroid   . Gallbladder problem   . Gastroparesis 09/14/2012   gastric emptying study in 2014  . GERD (gastroesophageal reflux disease)   . Pneumonia    2013ish  . PONV (postoperative nausea and vomiting)   . Pre-diabetes   . PTSD (post-traumatic stress disorder)   . Vitamin D deficiency     PAST SURGICAL HISTORY: Past Surgical History:  Procedure Laterality Date  . ANKLE ARTHROSCOPY Left 2011  . CESAREAN SECTION    . CHOLECYSTECTOMY    . DILATION AND CURETTAGE OF UTERUS    . FOREIGN BODY REMOVAL Left 11/18/2016    Procedure: REMOVAL FOREIGN BODY EXTREMITY LEFT FOOT;  Surgeon: Trula Slade, DPM;  Location: [facility];  Service: Podiatry;  Laterality: Left;  . PILONIDAL CYST EXCISION  1990's  . TENDON REPAIR Left 2011   Left Ankle  . WISDOM TOOTH EXTRACTION  19090's    SOCIAL HISTORY: Social History   Tobacco Use  . Smoking status: Former Smoker    Years: 10.00    Types: Cigarettes    Last attempt to quit: 2003    Years since quitting: 16.7  . Smokeless tobacco: Never Used  Substance Use Topics  . Alcohol use: Yes  Alcohol/week: 0.0 standard drinks    Comment: 1-2 per month   . Drug use: No    FAMILY HISTORY: Family History  Problem Relation Age of Onset  . Cancer Mother        squamous cell carcinoma  . Hyperlipidemia Mother   . Depression Mother   . Anxiety disorder Mother   . Obesity Mother   . Heart disease Father 64       cardiomegaly, CHF; steroid use.  [name] Hyperlipidemia Father   . Hypertension Father   . Mental retardation Father   . High blood pressure Father   . Depression Father   . Anxiety disorder Father   . Obesity Father   . Cancer Maternal Grandmother   . Diabetes Maternal Grandmother   . Heart disease Maternal Grandmother   . Hyperlipidemia Maternal Grandmother   . Hypertension Maternal Grandmother   . Stroke Maternal Grandmother   . Heart disease Paternal Grandmother   . Hypertension Paternal Grandmother   . Heart disease Paternal Grandfather   . Hyperlipidemia Paternal Grandfather   . Mental illness Paternal Grandfather   . Rheum arthritis Sister   . Breast cancer Maternal Aunt   . Thyroid cancer Paternal Aunt     ROS: Review of Systems  Constitutional: Positive for malaise/fatigue and weight loss.  Gastrointestinal: Negative for diarrhea, nausea and vomiting.  Genitourinary: Negative for frequency.  Musculoskeletal: Negative for myalgias.       Negative for muscle weakness  Endo/Heme/Allergies: Negative for polydipsia.       Negative for  hypoglycemia  Psychiatric/Behavioral: Positive for depression. Negative for suicidal ideas.    PHYSICAL EXAM: Blood pressure 112/73, pulse 85, temperature (!) 97.5 F (36.4 C), temperature source Oral, height 5\' 5"  (1.651 m), weight 262 lb (118.8 kg), SpO2 98 %. Body mass index is 43.6 kg/m. Physical Exam  Constitutional: She is oriented to person, place, and time. She appears well-developed and well-nourished.  Cardiovascular: Normal rate.  Pulmonary/Chest: Effort normal.  Musculoskeletal: Normal range of motion.  Neurological: She is oriented to person, place, and time.  Skin: Skin is warm and dry.  Psychiatric: She has a normal mood and affect. Her behavior is normal.  Vitals reviewed.   RECENT LABS AND TESTS: BMET    Component Value Date/Time   NA 138 01/05/2018 0915   NA 141 10/07/2017 1255   K 4.2 01/05/2018 0915   CL 107 01/05/2018 0915   CO2 23 01/05/2018 0915   GLUCOSE 97 01/05/2018 0915   BUN 17 01/05/2018 0915   BUN 18 10/07/2017 1255   CREATININE 0.93 01/05/2018 0915   CREATININE 0.95 06/27/2016 0933   CALCIUM 8.9 01/05/2018 0915   GFRNONAA >60 01/05/2018 0915   GFRAA >60 01/05/2018 0915   Lab Results  Component Value Date   HGBA1C 5.4 02/10/2018   HGBA1C 5.5 10/07/2017   HGBA1C 5.1 06/22/2017   HGBA1C 5.3 01/11/2017   HGBA1C 5.7 (H) 08/31/2016   Lab Results  Component Value Date   INSULIN 20.3 02/10/2018   INSULIN 15.6 10/07/2017   INSULIN 25.2 (H) 06/22/2017   INSULIN 17.0 01/11/2017   INSULIN 17.4 08/31/2016   CBC    Component Value Date/Time   WBC 5.5 01/05/2018 0915   RBC 4.02 01/05/2018 0915   HGB 11.1 (L) 01/05/2018 0915   HGB 11.2 06/22/2017 0904   HCT 35.4 (L) 01/05/2018 0915   HCT 34.8 06/22/2017 0904   PLT 285 01/05/2018 0915   PLT 332 11/12/2016 1651   MCV  88.1 01/05/2018 0915   MCV 86 06/22/2017 0904   MCH 27.6 01/05/2018 0915   MCHC 31.4 01/05/2018 0915   RDW 14.4 01/05/2018 0915   RDW 15.7 (H) 06/22/2017 0904    LYMPHSABS 1.5 06/22/2017 0904   MONOABS 891 04/30/2016 0947   EOSABS 0.2 06/22/2017 0904   BASOSABS 0.0 06/22/2017 0904   Iron/TIBC/Ferritin/ %Sat    Component Value Date/Time   IRON 53 08/31/2016 1644   TIBC 280 08/31/2016 1644   FERRITIN 36 08/31/2016 1644   IRONPCTSAT 19 08/31/2016 1644   Lipid Panel     Component Value Date/Time   CHOL 133 02/10/2018 0749   TRIG 199 (H) 02/10/2018 0749   HDL 38 (L) 02/10/2018 0749   CHOLHDL 3.5 02/10/2018 0749   CHOLHDL 4.1 12/01/2015 0914   VLDL 37 (H) 12/01/2015 0914   LDLCALC 55 02/10/2018 0749   Hepatic Function Panel     Component Value Date/Time   PROT 7.9 01/05/2018 0915   PROT 7.3 10/07/2017 1255   ALBUMIN 3.5 01/05/2018 0915   ALBUMIN 4.0 10/07/2017 1255   AST 20 01/05/2018 0915   ALT 18 01/05/2018 0915   ALKPHOS 75 01/05/2018 0915   BILITOT 0.5 01/05/2018 0915   BILITOT <0.2 10/07/2017 1255      Component Value Date/Time   TSH 1.620 08/31/2016 1644   TSH 1.83 12/01/2015 0914   Results for FRANCISCA, LANGENDERFER (MRN [id number]) as of 05/25/2018 13:53  Ref. Range 02/10/2018 07:49  Vitamin D, 25-Hydroxy Latest Ref Range: 30.0 - 100.0 ng/mL 58.5   ASSESSMENT AND PLAN: Severe depression (HCC)  Prediabetes - Plan: Comprehensive metabolic panel, Hemoglobin A1c, Insulin, random  Other hyperlipidemia - Plan: Lipid Panel With LDL/HDL Ratio  Vitamin D deficiency - Plan: VITAMIN D 25 Hydroxy (Vit-D Deficiency, Fractures)  At risk for diabetes mellitus  Class 3 severe obesity with serious comorbidity and body mass index (BMI) of 40.0 to 44.9 in adult, unspecified obesity type [facility])  PLAN:  Pre-Diabetes Valinda will continue to work on weight loss, exercise, and decreasing simple carbohydrates in her diet to help decrease the risk of diabetes. We dicussed metformin including benefits and risks. She was informed that eating too many simple carbohydrates or too many calories at one sitting increases the likelihood of GI side  effects. Damonica will continue victoza and metformin for now and a prescription was not written today. We will check Hgb A1c and insulin level today and Jeanny agreed to follow up with [name] as directed to monitor her progress.  Diabetes risk counseling Mckinley was given extended (15 minutes) diabetes prevention counseling today. She is 42 y.o. female and has risk factors for diabetes including obesity and prediabetes. We discussed intensive lifestyle modifications today with an emphasis on weight loss as well as increasing exercise and decreasing simple carbohydrates in her diet.  Severe Depression We discussed behavior modification techniques today to help Nely deal with her emotional eating and depression. She will follow up with her psychiatrist and she agreed to follow up with our clinic as directed.  Hyperlipidemia Prosperity was informed of the American Heart Association Guidelines emphasizing intensive lifestyle modifications as the first line treatment for hyperlipidemia. We discussed many lifestyle modifications today in depth, and Olesya will continue to work on decreasing saturated fats such as fatty red meat, butter and many fried foods. She will also increase vegetables and lean protein in her diet and continue to work on exercise and weight loss efforts. We will check fasting lipid panel today  and Noemi will follow up as directed.  Vitamin D Deficiency Eriel was informed that low vitamin D levels contributes to fatigue and are associated with obesity, breast, and colon cancer. She will  continue to take OTC Vit D @5 ,000 IU daily and will follow up for routine testing of vitamin D, at least 2-3 times per year. She was informed of the risk of over-replacement of vitamin D and agrees to not increase her dose unless she discusses this with [name] first. We will check vitamin D level today and Daisa agrees to follow up as directed.  Obesity Sybrina is currently in the action stage of  change. As such, her goal is to continue with weight loss efforts She has agreed to portion control better and make smarter food choices, such as increase vegetables and decrease simple carbohydrates  Sophy has been instructed to work up to a goal of 150 minutes of combined cardio and strengthening exercise per week for weight loss and overall health benefits. We discussed the following Behavioral Modification Strategies today: planning for success, increasing lean protein intake, increasing vegetables, work on meal planning and easy cooking plans and emotional eating strategies  Darrelle has agreed to follow up with our clinic in 2 weeks. She was informed of the importance of frequent follow up visits to maximize her success with intensive lifestyle modifications for her multiple health conditions.   OBESITY BEHAVIORAL INTERVENTION VISIT  Today's visit was # 58  Starting weight: 278 lbs Starting date: 08/31/16 Today's weight : 262 lbs Today's date: 05/25/2018 Total lbs lost to date: 16  ASK: We discussed the diagnosis of obesity with Marjie Skiff Crammer today and Vlasta agreed to give [name] permission to discuss obesity behavioral modification therapy today.  ASSESS: Azoria has the diagnosis of obesity and her BMI today is 43.6 Ceazia is in the action stage of change   ADVISE: Abiageal was educated on the multiple health risks of obesity as well as the benefit of weight loss to improve her health. She was advised of the need for long term treatment and the importance of lifestyle modifications to improve her current health and to decrease her risk of future health problems.  AGREE: Multiple dietary modification options and treatment options were discussed and  Jackalynn agreed to follow the recommendations documented in the above note.  ARRANGE: Gailene was educated on the importance of frequent visits to treat obesity as outlined per CMS and USPSTF guidelines and agreed to schedule her  next follow up appointment today.  I, Doreene Nest, am acting as transcriptionist for Eber Jones, MD  I have reviewed the above documentation for accuracy and completeness, and I agree with the above. - Ilene Qua, MD

## 2018-05-25 NOTE — Progress Notes (Signed)
GYNECOLOGY  VISIT   HPI: 42 y.o.   Married White or Caucasian   female   G3P1000 with No LMP recorded. (Menstrual status: IUD).   here for breast recheck.   She was originally seen 9 days with nipple pain, inflammation and ulceration. She was treated with Valtrex, diflucan and a compounded nipple ointment. HSV and yeast cultures were negative.  She is feeling so much better, 90% improved. Now just mildly tender to touch, no longer itchy or inflamed.   GYNECOLOGIC HISTORY: No LMP recorded. (Menstrual status: IUD). Contraception:Mirena IUD Menopausal hormone therapy: none        OB History    Gravida  3   Para  1   Term  1   Preterm  0   AB  0   Living  0     SAB  0   TAB  0   Ectopic  0   Multiple  0   Live Births  0              Patient Active Problem List   Diagnosis Date Noted  . Insulin resistance 10/21/2017  . Other hyperlipidemia 10/07/2017  . Vitamin D deficiency 10/07/2017  . Bipolar depression ([place]) 04/27/2017  . Class 3 obesity with serious comorbidity and body mass index (BMI) of 40.0 to 44.9 in adult 04/27/2017  . Residual foreign body in soft tissue 11/12/2016  . Prediabetes 11/02/2016  . Morbid obesity ([place]) 10/15/2016  . Bipolar 1 disorder ([place])     Past Medical History:  Diagnosis Date  . Allergy    Zyrtec, Allegra.  . Anxiety    followed by Dr. Toy Cookey  . Back pain   . Bipolar 1 disorder ([place])   . Complication of anesthesia   . Depression   . Fibroid   . Gallbladder problem   . Gastroparesis 09/14/2012   gastric emptying study in 2014  . GERD (gastroesophageal reflux disease)   . Pneumonia    2013ish  . PONV (postoperative nausea and vomiting)   . Pre-diabetes   . PTSD (post-traumatic stress disorder)   . Vitamin D deficiency     Past Surgical History:  Procedure Laterality Date  . ANKLE ARTHROSCOPY Left 2011  . CESAREAN SECTION    . CHOLECYSTECTOMY    . DILATION AND CURETTAGE OF UTERUS    . FOREIGN BODY REMOVAL Left  11/18/2016   Procedure: REMOVAL FOREIGN BODY EXTREMITY LEFT FOOT;  Surgeon: Trula Slade, DPM;  Location: [facility];  Service: Podiatry;  Laterality: Left;  . PILONIDAL CYST EXCISION  1990's  . TENDON REPAIR Left 2011   Left Ankle  . WISDOM TOOTH EXTRACTION  19090's    Current Outpatient Medications  Medication Sig Dispense Refill  . betamethasone valerate ointment (VALISONE) 0.1 % Use a pea sized amount BID for up to 7 days. 15 g 0  . Cholecalciferol (VITAMIN D3) 5000 units CAPS Take 5,000 Units by mouth at bedtime.    . clonazePAM (KLONOPIN) 0.5 MG tablet Take 1.5 mg by mouth at bedtime.    . Dulaglutide (TRULICITY) 9.50 DT/2.6ZT SOPN Inject 0.75 mg into the skin every 7 (seven) days. 3 pen 0  . fluconazole (DIFLUCAN) 150 MG tablet Take one tablet.  Repeat in 72 hours if symptoms are not completely resolved. 2 tablet 0  . L-methylfolate Calcium 15 MG TABS Take 15 mg by mouth at bedtime.     . lamoTRIgine (LAMICTAL PO) Take 250 mg by mouth daily.    [name]  levonorgestrel (MIRENA) 20 MCG/24HR IUD 1 each by Intrauterine route once. Place 03/2013    . loratadine (CLARITIN) 10 MG tablet Take 10 mg by mouth at bedtime.     . metFORMIN (GLUCOPHAGE) 1000 MG tablet Take 1 tablet (1,000 mg total) by mouth daily with breakfast. 30 tablet 0  . Multiple Vitamin (MULTIVITAMIN) tablet Take 1 tablet by mouth at bedtime.     . NONFORMULARY OR COMPOUNDED ITEM 2% miconazole, 2%mupiracin, 2%ibuprofen ointment  30 grams, no refills. Apply tid prn 30 each 0  . pantoprazole (PROTONIX) 40 MG tablet Take 40 mg by mouth daily.    . prazosin (MINIPRESS) 1 MG capsule Take 1 mg by mouth at bedtime.    . propranolol (INDERAL) 40 MG tablet Take 40 mg by mouth 2 (two) times daily.     . QUEtiapine (SEROQUEL) 200 MG tablet Take 100 mg by mouth at bedtime.    . valACYclovir (VALTREX) 1000 MG tablet Take 1 tablet (1,000 mg total) by mouth 2 (two) times daily. 14 tablet 0   No current facility-administered medications for this  visit.      ALLERGIES: Penicillins; Tamiflu [oseltamivir phosphate]; Cephalosporins; and Latex  Family History  Problem Relation Age of Onset  . Cancer Mother        squamous cell carcinoma  . Hyperlipidemia Mother   . Depression Mother   . Anxiety disorder Mother   . Obesity Mother   . Heart disease Father 72       cardiomegaly, CHF; steroid use.  [name] Hyperlipidemia Father   . Hypertension Father   . Mental retardation Father   . High blood pressure Father   . Depression Father   . Anxiety disorder Father   . Obesity Father   . Cancer Maternal Grandmother   . Diabetes Maternal Grandmother   . Heart disease Maternal Grandmother   . Hyperlipidemia Maternal Grandmother   . Hypertension Maternal Grandmother   . Stroke Maternal Grandmother   . Heart disease Paternal Grandmother   . Hypertension Paternal Grandmother   . Heart disease Paternal Grandfather   . Hyperlipidemia Paternal Grandfather   . Mental illness Paternal Grandfather   . Rheum arthritis Sister   . Breast cancer Maternal Aunt   . Thyroid cancer Paternal Aunt     Social History   Socioeconomic History  . Marital status: Married    Spouse name: Not on file  . Number of children: Not on file  . Years of education: Not on file  . Highest education level: Not on file  Occupational History  . Occupation: Programmer, multimedia: [employer]  . Financial resource strain: Not on file  . Food insecurity:    Worry: Not on file    Inability: Not on file  . Transportation needs:    Medical: Not on file    Non-medical: Not on file  Tobacco Use  . Smoking status: Former Smoker    Years: 10.00    Types: Cigarettes    Last attempt to quit: 2003    Years since quitting: 16.7  . Smokeless tobacco: Never Used  Substance and Sexual Activity  . Alcohol use: Yes    Alcohol/week: 0.0 standard drinks    Comment: 1-2 per month   . Drug use: No  . Sexual activity: Yes    Partners: Male    Birth  control/protection: IUD    Comment: Mirena  Lifestyle  . Physical activity:    Days per  week: Not on file    Minutes per session: Not on file  . Stress: Not on file  Relationships  . Social connections:    Talks on phone: Not on file    Gets together: Not on file    Attends religious service: Not on file    Active member of club or organization: Not on file    Attends meetings of clubs or organizations: Not on file    Relationship status: Not on file  . Intimate partner violence:    Fear of current or ex partner: Not on file    Emotionally abused: Not on file    Physically abused: Not on file    Forced sexual activity: Not on file  Other Topics Concern  . 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      Seatbelt: 100%; no texting while driving often        Review of Systems  Constitutional: Negative.   HENT: Negative.   Eyes: Negative.   Respiratory: Negative.   Cardiovascular: Negative.   Gastrointestinal: Negative.   Endocrine: Negative.   Genitourinary: Negative.   Musculoskeletal: Negative.   Skin: Negative.   Allergic/Immunologic: Negative.   Neurological: Negative.   Hematological: Negative.   Psychiatric/Behavioral: Negative.   All other systems reviewed and are negative.   PHYSICAL EXAMINATION:    There were no vitals taken for this visit.    General appearance: alert, cooperative and appears stated age Breasts:right nipple, no ulcerations, inflammation noted. It is slightly more red than her other nipple, but otherwise appears normal. Repeat breast exam was not done.   ASSESSMENT Nipple infection, almost completely resolved, etiology  still unclear    PLAN Call with recurrent symptoms She will set up her screening mammogram   An After Visit Summary was printed and given to the patient.

## 2018-05-26 ENCOUNTER — Ambulatory Visit: Payer: 59 | Admitting: Obstetrics and Gynecology

## 2018-05-26 ENCOUNTER — Other Ambulatory Visit: Payer: Self-pay

## 2018-05-26 ENCOUNTER — Encounter: Payer: Self-pay | Admitting: Obstetrics and Gynecology

## 2018-05-26 VITALS — BP 118/68 | HR 86 | Ht 65.0 in

## 2018-05-26 DIAGNOSIS — N61 Mastitis without abscess: Secondary | ICD-10-CM

## 2018-05-26 LAB — COMPREHENSIVE METABOLIC PANEL
ALT: 14 IU/L (ref 0–32)
AST: 16 IU/L (ref 0–40)
Albumin/Globulin Ratio: 1.2 (ref 1.2–2.2)
Albumin: 3.9 g/dL (ref 3.5–5.5)
Alkaline Phosphatase: 93 IU/L (ref 39–117)
BUN/Creatinine Ratio: 11 (ref 9–23)
BUN: 12 mg/dL (ref 6–24)
Bilirubin Total: <0.2 mg/dL (ref 0.0–1.2)
CO2: 20 mmol/L (ref 20–29)
Calcium: 9.1 mg/dL (ref 8.7–10.2)
Chloride: 100 mmol/L (ref 96–106)
Creatinine, Ser: 1.06 mg/dL — ABNORMAL HIGH (ref 0.57–1.00)
GFR calc Af Amer: 75 mL/min/{1.73_m2} (ref >59)
GFR calc non Af Amer: 65 mL/min/{1.73_m2} (ref >59)
Globulin, Total: 3.2 g/dL (ref 1.5–4.5)
Glucose: 98 mg/dL (ref 65–99)
Potassium: 4.1 mmol/L (ref 3.5–5.2)
Sodium: 135 mmol/L (ref 134–144)
Total Protein: 7.1 g/dL (ref 6.0–8.5)

## 2018-05-26 LAB — INSULIN, RANDOM: INSULIN: 87.4 u[IU]/mL — ABNORMAL HIGH (ref 2.6–24.9)

## 2018-05-26 LAB — LIPID PANEL WITH LDL/HDL RATIO
Cholesterol, Total: 137 mg/dL (ref 100–199)
HDL: 36 mg/dL — ABNORMAL LOW (ref >39)
LDL Calculated: 62 mg/dL (ref 0–99)
LDl/HDL Ratio: 1.7 ratio (ref 0.0–3.2)
Triglycerides: 194 mg/dL — ABNORMAL HIGH (ref 0–149)
VLDL Cholesterol Cal: 39 mg/dL (ref 5–40)

## 2018-05-26 LAB — HEMOGLOBIN A1C
Est. average glucose Bld gHb Est-mCnc: 105 mg/dL
Hgb A1c MFr Bld: 5.3 % (ref 4.8–5.6)

## 2018-05-26 LAB — VITAMIN D 25 HYDROXY (VIT D DEFICIENCY, FRACTURES): Vit D, 25-Hydroxy: 53.9 ng/mL (ref 30.0–100.0)

## 2018-05-26 MED ORDER — DULAGLUTIDE 0.75 MG/0.5ML ~~LOC~~ SOAJ: 0.7500 mg | SUBCUTANEOUS | 0 refills | Status: DC

## 2018-05-26 MED FILL — TRULICITY 0.75 MG/0.5 ML PE: 0.75 | 28 days supply | Qty: 2 | Fill #0

## 2018-05-31 DIAGNOSIS — F4310 Post-traumatic stress disorder, unspecified: Secondary | ICD-10-CM | POA: Diagnosis not present

## 2018-06-01 ENCOUNTER — Encounter (INDEPENDENT_AMBULATORY_CARE_PROVIDER_SITE_OTHER): Payer: Self-pay | Admitting: Family Medicine

## 2018-06-08 ENCOUNTER — Ambulatory Visit (INDEPENDENT_AMBULATORY_CARE_PROVIDER_SITE_OTHER): Payer: 59 | Admitting: Family Medicine

## 2018-06-08 VITALS — BP 132/75 | HR 85 | Temp 97.4°F | Ht 65.0 in | Wt 264.0 lb

## 2018-06-08 DIAGNOSIS — Z9189 Other specified personal risk factors, not elsewhere classified: Secondary | ICD-10-CM | POA: Diagnosis not present

## 2018-06-08 DIAGNOSIS — Z6841 Body Mass Index (BMI) 40.0 and over, adult: Secondary | ICD-10-CM | POA: Diagnosis not present

## 2018-06-08 DIAGNOSIS — E8881 Metabolic syndrome: Secondary | ICD-10-CM | POA: Diagnosis not present

## 2018-06-08 DIAGNOSIS — E559 Vitamin D deficiency, unspecified: Secondary | ICD-10-CM

## 2018-06-08 MED ORDER — METFORMIN HCL 1000 MG PO TABS: 1000.0000 mg | ORAL_TABLET | Freq: Every day | ORAL | 0 refills | Status: DC

## 2018-06-08 MED FILL — PANTOPRAZOLE SOD DR 40 MG T: 40 | 30 days supply | Qty: 30 | Fill #2

## 2018-06-08 MED FILL — metFORMIN HCL 1000 MG TABS: 1000 | 30 days supply | Qty: 30 | Fill #0

## 2018-06-08 NOTE — Progress Notes (Signed)
Office: [phone]  /  Fax: [phone]   HPI:   Chief Complaint: OBESITY Caroline Gonzales is here to discuss her progress with her obesity treatment plan. She is on the  portion control better and make smarter food choices, such as increase vegetables and decrease simple carbohydrates  and is following her eating plan approximately 30-40 % of the time. She states she is exercising by walking for 10 minutes 5 times per week. Caroline Gonzales has noticed improvement in sweets cravings in the evenings but still struggling with sweet cravings during the day.  Her weight is   today and has not lost weight since her last visit. She has lost 14 lbs since starting treatment with [name].  Insulin Resistance Caroline Gonzales has a diagnosis of insulin resistance based on her elevated fasting insulin level >5. Although Caroline Gonzales's blood glucose readings are still under good control, insulin resistance puts her at greater risk of metabolic syndrome and diabetes. She is taking metformin and trulicity currently and continues to work on diet and exercise to decrease risk of diabetes. She reports cravings in early morning and afternoon.   Vitamin D deficiency Caroline Gonzales has a diagnosis of vitamin D deficiency. She is currently taking OTC vit D and denies nausea, vomiting or muscle weakness. She reports fatigue.   At risk for diabetes Caroline Gonzales is at higher than averagerisk for developing diabetes due to her obesity. She currently denies polyuria or polydipsia.  ALLERGIES: Allergies  Allergen Reactions  . Penicillins Shortness Of Breath and Rash    Has patient had a PCN reaction causing immediate rash, facial/tongue/throat swelling, SOB or lightheadedness with hypotension: Yes Has patient had a PCN reaction causing severe rash involving mucus membranes or skin necrosis: Yes Has patient had a PCN reaction that required hospitalization: No Has patient had a PCN reaction occurring within the last 10 years: No If all of the above answers  are "NO", then may proceed with Cephalosporin use.   . Tamiflu [Oseltamivir Phosphate] Anaphylaxis  . Cephalosporins Rash  . Latex Rash    MEDICATIONS: Current Outpatient Medications on File Prior to Visit  Medication Sig Dispense Refill  . Cholecalciferol (VITAMIN D3) 5000 units CAPS Take 5,000 Units by mouth at bedtime.    . clonazePAM (KLONOPIN) 0.5 MG tablet Take 1.5 mg by mouth at bedtime.    . Dulaglutide (TRULICITY) 7.32 KG/2.5KY SOPN Inject 0.75 mg into the skin every 7 (seven) days. 3 pen 0  . fluconazole (DIFLUCAN) 150 MG tablet Take one tablet.  Repeat in 72 hours if symptoms are not completely resolved. 2 tablet 0  . L-methylfolate Calcium 15 MG TABS Take 15 mg by mouth at bedtime.     . lamoTRIgine (LAMICTAL) 100 MG tablet     . levonorgestrel (MIRENA) 20 MCG/24HR IUD 1 each by Intrauterine route once. Place 03/2013    . loratadine (CLARITIN) 10 MG tablet Take 10 mg by mouth at bedtime.     . Miconazole POWD     . Multiple Vitamin (MULTIVITAMIN) tablet Take 1 tablet by mouth at bedtime.     . pantoprazole (PROTONIX) 40 MG tablet Take 40 mg by mouth daily.    . prazosin (MINIPRESS) 1 MG capsule Take 1 mg by mouth at bedtime.    . propranolol (INDERAL) 40 MG tablet Take 40 mg by mouth 2 (two) times daily.     . QUEtiapine (SEROQUEL) 200 MG tablet Take 100 mg by mouth at bedtime.     No current facility-administered medications on file prior  to visit.     PAST MEDICAL HISTORY: Past Medical History:  Diagnosis Date  . Allergy    Zyrtec, Allegra.  . Anxiety    followed by Dr. Toy Cookey  . Back pain   . Bipolar 1 disorder ([place])   . Complication of anesthesia   . Depression   . Fibroid   . Gallbladder problem   . Gastroparesis 09/14/2012   gastric emptying study in 2014  . GERD (gastroesophageal reflux disease)   . Pneumonia    2013ish  . PONV (postoperative nausea and vomiting)   . Pre-diabetes   . PTSD (post-traumatic stress disorder)   . Vitamin D deficiency      PAST SURGICAL HISTORY: Past Surgical History:  Procedure Laterality Date  . ANKLE ARTHROSCOPY Left 2011  . CESAREAN SECTION    . CHOLECYSTECTOMY    . DILATION AND CURETTAGE OF UTERUS    . FOREIGN BODY REMOVAL Left 11/18/2016   Procedure: REMOVAL FOREIGN BODY EXTREMITY LEFT FOOT;  Surgeon: Trula Slade, DPM;  Location: [facility];  Service: Podiatry;  Laterality: Left;  . PILONIDAL CYST EXCISION  1990's  . TENDON REPAIR Left 2011   Left Ankle  . WISDOM TOOTH EXTRACTION  19090's    SOCIAL HISTORY: Social History   Tobacco Use  . Smoking status: Former Smoker    Years: 10.00    Types: Cigarettes    Last attempt to quit: 2003    Years since quitting: 16.7  . Smokeless tobacco: Never Used  Substance Use Topics  . Alcohol use: Yes    Alcohol/week: 0.0 standard drinks    Comment: 1-2 per month   . Drug use: No    FAMILY HISTORY: Family History  Problem Relation Age of Onset  . Cancer Mother        squamous cell carcinoma  . Hyperlipidemia Mother   . Depression Mother   . Anxiety disorder Mother   . Obesity Mother   . Heart disease Father 41       cardiomegaly, CHF; steroid use.  [name] Hyperlipidemia Father   . Hypertension Father   . Mental retardation Father   . High blood pressure Father   . Depression Father   . Anxiety disorder Father   . Obesity Father   . Cancer Maternal Grandmother   . Diabetes Maternal Grandmother   . Heart disease Maternal Grandmother   . Hyperlipidemia Maternal Grandmother   . Hypertension Maternal Grandmother   . Stroke Maternal Grandmother   . Heart disease Paternal Grandmother   . Hypertension Paternal Grandmother   . Heart disease Paternal Grandfather   . Hyperlipidemia Paternal Grandfather   . Mental illness Paternal Grandfather   . Rheum arthritis Sister   . Breast cancer Maternal Aunt   . Thyroid cancer Paternal Aunt     ROS: Review of Systems  Constitutional: Positive for malaise/fatigue. Negative for weight loss.   Gastrointestinal: Negative for nausea and vomiting.  Musculoskeletal:       Negative for muscle weakness   Endo/Heme/Allergies: Positive for polydipsia.       Negative for polyuria    PHYSICAL EXAM: Blood pressure 132/75, pulse 85, temperature (!) 97.4 F (36.3 C), temperature source Oral, height 5\' 5"  (1.651 m), SpO2 98 %. Body mass index is 43.6 kg/m. Physical Exam  Constitutional: She is oriented to person, place, and time. She appears well-developed and well-nourished.  HENT:  Head: Normocephalic.  Cardiovascular: Normal rate.  Pulmonary/Chest: Effort normal.  Musculoskeletal: Normal range of motion.  Neurological: She is alert and oriented to person, place, and time.  Skin: Skin is warm and dry.  Psychiatric: She has a normal mood and affect. Her behavior is normal.  Nursing note and vitals reviewed.   RECENT LABS AND TESTS: BMET    Component Value Date/Time   NA 135 05/25/2018 0831   K 4.1 05/25/2018 0831   CL 100 05/25/2018 0831   CO2 20 05/25/2018 0831   GLUCOSE 98 05/25/2018 0831   GLUCOSE 97 01/05/2018 0915   BUN 12 05/25/2018 0831   CREATININE 1.06 (H) 05/25/2018 0831   CREATININE 0.95 06/27/2016 0933   CALCIUM 9.1 05/25/2018 0831   GFRNONAA 65 05/25/2018 0831   GFRAA 75 05/25/2018 0831   Lab Results  Component Value Date   HGBA1C 5.3 05/25/2018   HGBA1C 5.4 02/10/2018   HGBA1C 5.5 10/07/2017   HGBA1C 5.1 06/22/2017   HGBA1C 5.3 01/11/2017   Lab Results  Component Value Date   INSULIN 87.4 (H) 05/25/2018   INSULIN 20.3 02/10/2018   INSULIN 15.6 10/07/2017   INSULIN 25.2 (H) 06/22/2017   INSULIN 17.0 01/11/2017   CBC    Component Value Date/Time   WBC 5.5 01/05/2018 0915   RBC 4.02 01/05/2018 0915   HGB 11.1 (L) 01/05/2018 0915   HGB 11.2 06/22/2017 0904   HCT 35.4 (L) 01/05/2018 0915   HCT 34.8 06/22/2017 0904   PLT 285 01/05/2018 0915   PLT 332 11/12/2016 1651   MCV 88.1 01/05/2018 0915   MCV 86 06/22/2017 0904   MCH 27.6 01/05/2018  0915   MCHC 31.4 01/05/2018 0915   RDW 14.4 01/05/2018 0915   RDW 15.7 (H) 06/22/2017 0904   LYMPHSABS 1.5 06/22/2017 0904   MONOABS 891 04/30/2016 0947   EOSABS 0.2 06/22/2017 0904   BASOSABS 0.0 06/22/2017 0904   Iron/TIBC/Ferritin/ %Sat    Component Value Date/Time   IRON 53 08/31/2016 1644   TIBC 280 08/31/2016 1644   FERRITIN 36 08/31/2016 1644   IRONPCTSAT 19 08/31/2016 1644   Lipid Panel     Component Value Date/Time   CHOL 137 05/25/2018 0831   TRIG 194 (H) 05/25/2018 0831   HDL 36 (L) 05/25/2018 0831   CHOLHDL 3.5 02/10/2018 0749   CHOLHDL 4.1 12/01/2015 0914   VLDL 37 (H) 12/01/2015 0914   LDLCALC 62 05/25/2018 0831   Hepatic Function Panel     Component Value Date/Time   PROT 7.1 05/25/2018 0831   ALBUMIN 3.9 05/25/2018 0831   AST 16 05/25/2018 0831   ALT 14 05/25/2018 0831   ALKPHOS 93 05/25/2018 0831   BILITOT <0.2 05/25/2018 0831      Component Value Date/Time   TSH 1.620 08/31/2016 1644   TSH 1.83 12/01/2015 0914    Ref. Range 05/25/2018 08:31  Vitamin D, 25-Hydroxy Latest Ref Range: 30.0 - 100.0 ng/mL 53.9    ASSESSMENT AND PLAN: Vitamin D deficiency  Insulin resistance - Plan: metFORMIN (GLUCOPHAGE) 1000 MG tablet  At risk for diabetes mellitus  Class 3 severe obesity with serious comorbidity and body mass index (BMI) of 40.0 to 44.9 in adult, unspecified obesity type (Caroline Gonzales)  PLAN: Insulin Resistance Caroline Gonzales will continue to work on weight loss, exercise, and decreasing simple carbohydrates in her diet to help decrease the risk of diabetes. We dicussed metformin including benefits and risks. She was informed that eating too many simple carbohydrates or too many calories at one sitting increases the likelihood of GI side effects. Caroline Gonzales agreed to continue Metformin 1000  mg daily #30 with no refills.  Caroline Gonzales agreed to follow up with [name] as directed to monitor her progress.  Vitamin D Deficiency Caroline Gonzales was informed that low vitamin D levels  contributes to fatigue and are associated with obesity, breast, and colon cancer. She agrees to continue to take OTC vitamin D daily and will follow up for routine testing of vitamin D, at least 2-3 times per year. She was informed of the risk of over-replacement of vitamin D and agrees to not increase her dose unless she discusses this with [name] first. Agrees to follow up with our clinic as directed.   Diabetes risk counselling Caroline Gonzales was given extended (15 minutes) diabetes prevention counseling today. She is 42 y.o. female and has risk factors for diabetes including obesity. We discussed intensive lifestyle modifications today with an emphasis on weight loss as well as increasing exercise and decreasing simple carbohydrates in her diet.  Obesity Caroline Gonzales is currently in the action stage of change. As such, her goal is to continue with weight loss efforts She has agreed to portion control better and make smarter food choices, such as increase vegetables and decrease simple carbohydrates  Caroline Gonzales has been instructed to work up to a goal of 150 minutes of combined cardio and strengthening exercise per week for weight loss and overall health benefits. We discussed the following Behavioral Modification Stratagies today: increasing lean protein intake, increasing vegetables, decrease liquid calories, and planning for success.   Caroline Gonzales has agreed to follow up with our clinic in 2 weeks. She was informed of the importance of frequent follow up visits to maximize her success with intensive lifestyle modifications for her multiple health conditions.   OBESITY BEHAVIORAL INTERVENTION VISIT  Today's visit was # 54  Starting weight: 278 lb Starting date: 08/31/16 Today's weight : 264 lb Today's date: 06/08/2018 Total lbs lost to date: 14 lb    ASK: We discussed the diagnosis of obesity with Caroline Gonzales today and Caroline Gonzales agreed to give [name] permission to discuss obesity behavioral modification  therapy today.  ASSESS: Caroline Gonzales has the diagnosis of obesity and her BMI today is 43.93 Caroline Gonzales is in the action stage of change   ADVISE: Caroline Gonzales was educated on the multiple health risks of obesity as well as the benefit of weight loss to improve her health. She was advised of the need for long term treatment and the importance of lifestyle modifications to improve her current health and to decrease her risk of future health problems.  AGREE: Multiple dietary modification options and treatment options were discussed and  Caroline Gonzales agreed to follow the recommendations documented in the above note.  ARRANGE: Caroline Gonzales was educated on the importance of frequent visits to treat obesity as outlined per CMS and USPSTF guidelines and agreed to schedule her next follow up appointment today.  I, Renee Ramus, am acting as transcriptionist for Ilene Qua, MD   I have reviewed the above documentation for accuracy and completeness, and I agree with the above. - Ilene Qua, MD

## 2018-06-14 DIAGNOSIS — F4310 Post-traumatic stress disorder, unspecified: Secondary | ICD-10-CM | POA: Diagnosis not present

## 2018-06-17 ENCOUNTER — Other Ambulatory Visit (INDEPENDENT_AMBULATORY_CARE_PROVIDER_SITE_OTHER): Payer: Self-pay | Admitting: Family Medicine

## 2018-06-17 DIAGNOSIS — R7303 Prediabetes: Secondary | ICD-10-CM

## 2018-06-20 ENCOUNTER — Other Ambulatory Visit: Payer: Self-pay | Admitting: Physician Assistant

## 2018-06-20 MED FILL — PRAZOSIN 1 MG CAPSULE: 1 | 30 days supply | Qty: 90 | Fill #1

## 2018-06-21 MED FILL — PROPRANOLOL 40 MG TABLET: 40 | 30 days supply | Qty: 60 | Fill #0

## 2018-06-23 ENCOUNTER — Ambulatory Visit (INDEPENDENT_AMBULATORY_CARE_PROVIDER_SITE_OTHER): Payer: 59 | Admitting: Family Medicine

## 2018-06-23 VITALS — BP 107/70 | HR 82 | Temp 97.6°F | Ht 65.0 in | Wt 263.0 lb

## 2018-06-23 DIAGNOSIS — R7303 Prediabetes: Secondary | ICD-10-CM

## 2018-06-23 DIAGNOSIS — Z6841 Body Mass Index (BMI) 40.0 and over, adult: Secondary | ICD-10-CM

## 2018-06-23 DIAGNOSIS — E559 Vitamin D deficiency, unspecified: Secondary | ICD-10-CM

## 2018-06-23 DIAGNOSIS — Z9189 Other specified personal risk factors, not elsewhere classified: Secondary | ICD-10-CM | POA: Diagnosis not present

## 2018-06-23 MED ORDER — DULAGLUTIDE 0.75 MG/0.5ML ~~LOC~~ SOAJ: 0.7500 mg | SUBCUTANEOUS | 0 refills | Status: DC

## 2018-06-23 MED FILL — TRULICITY 0.75 MG/0.5 ML PE: 0.75 | 28 days supply | Qty: 2 | Fill #0

## 2018-06-24 ENCOUNTER — Ambulatory Visit (INDEPENDENT_AMBULATORY_CARE_PROVIDER_SITE_OTHER): Payer: Self-pay | Admitting: Nurse Practitioner

## 2018-06-24 DIAGNOSIS — Z111 Encounter for screening for respiratory tuberculosis: Secondary | ICD-10-CM

## 2018-06-24 NOTE — Progress Notes (Signed)
Patient presents for PPD placement for  Denies previous positive TB test  Denies known exposure to TB   Tuberculin skin test applied to right arm.  Patient informed to return to [facility] in 48-72 hours for PPD read. Vaccine Information Statement provided to patient.

## 2018-06-27 MED FILL — clonazePAM 0.5 MG TABS: 0.5 | 30 days supply | Qty: 120 | Fill #4

## 2018-06-27 NOTE — Progress Notes (Signed)
Office: [phone]  /  Fax: [phone]   HPI:   Chief Complaint: OBESITY Caroline Gonzales is here to discuss her progress with her obesity treatment plan. She is on the  portion control better and make smarter food choices plan and is following her eating plan approximately 0 % of the time. She states she is walking for 10 minutes 5 times per week. Evellyn as not been following any plan and is currently struggling with consistently making poor food choices.  Her weight is 263 lb (119.3 kg) today and has had a weight loss of 1 pound over a period of 2 weeks since her last visit. She has lost 15 lbs since starting treatment with [name].  Vitamin D deficiency Chandelle has a diagnosis of vitamin D deficiency. Newell is currently taking vit D and admits fatigue but denies nausea, vomiting or muscle weakness.  Pre-Diabetes Akita has a diagnosis of prediabetes based on her elevated Hgb A1c of 5.3 and insulin level of 87.4 and was informed this puts her at greater risk of developing diabetes. She is taking metformin and Trulicity currently and continues to work on diet and exercise to decrease risk of diabetes. She denies nausea or hypoglycemia.  At risk for diabetes Pearly is at higher than average risk for developing diabetes due to her obesity and prediabetes. She currently denies polyuria or polydipsia.  ALLERGIES: Allergies  Allergen Reactions  . Penicillins Shortness Of Breath and Rash    Has patient had a PCN reaction causing immediate rash, facial/tongue/throat swelling, SOB or lightheadedness with hypotension: Yes Has patient had a PCN reaction causing severe rash involving mucus membranes or skin necrosis: Yes Has patient had a PCN reaction that required hospitalization: No Has patient had a PCN reaction occurring within the last 10 years: No If all of the above answers are "NO", then may proceed with Cephalosporin use.   . Tamiflu [Oseltamivir Phosphate] Anaphylaxis  . Cephalosporins  Rash  . Latex Rash    MEDICATIONS: Current Outpatient Medications on File Prior to Visit  Medication Sig Dispense Refill  . Cholecalciferol (VITAMIN D3) 5000 units CAPS Take 5,000 Units by mouth at bedtime.    . clonazePAM (KLONOPIN) 0.5 MG tablet Take 1.5 mg by mouth at bedtime.    . L-methylfolate Calcium 15 MG TABS Take 15 mg by mouth at bedtime.     [name] L-Methylfolate-Algae (DEPLIN 15) 15-90.314 MG CAPS Take by mouth daily.    [name] lamoTRIgine (LAMICTAL) 100 MG tablet Take 250 mg by mouth at bedtime. 1/2 po qhs x 2 wks, then up to 1 qhs    . levonorgestrel (MIRENA) 20 MCG/24HR IUD 1 each by Intrauterine route once. Place 03/2013    . loratadine (CLARITIN) 10 MG tablet Take 10 mg by mouth at bedtime.     . metFORMIN (GLUCOPHAGE) 1000 MG tablet Take 1 tablet (1,000 mg total) by mouth daily with breakfast. 30 tablet 0  . Multiple Vitamin (MULTIVITAMIN) tablet Take 1 tablet by mouth at bedtime.     . pantoprazole (PROTONIX) 40 MG tablet Take 40 mg by mouth daily.    . prazosin (MINIPRESS) 1 MG capsule Take 3 mg by mouth at bedtime.     . propranolol (INDERAL) 40 MG tablet TAKE 1 TABLET BY MOUTH TWICE DAILY 60 tablet 0  . QUEtiapine (SEROQUEL) 200 MG tablet Take 200 mg by mouth at bedtime.     No current facility-administered medications on file prior to visit.     PAST MEDICAL HISTORY: Past  Medical History:  Diagnosis Date  . Allergy    Zyrtec, Allegra.  . Anxiety    followed by Dr. Toy Cookey  . Back pain   . Bipolar 1 disorder ([place])   . Complication of anesthesia   . Depression   . Fibroid   . Gallbladder problem   . Gastroparesis 09/14/2012   gastric emptying study in 2014  . GERD (gastroesophageal reflux disease)   . Pneumonia    2013ish  . PONV (postoperative nausea and vomiting)   . Pre-diabetes   . PTSD (post-traumatic stress disorder)   . Vitamin D deficiency     PAST SURGICAL HISTORY: Past Surgical History:  Procedure Laterality Date  . ANKLE ARTHROSCOPY Left 2011  .  CESAREAN SECTION    . CHOLECYSTECTOMY    . DILATION AND CURETTAGE OF UTERUS    . FOREIGN BODY REMOVAL Left 11/18/2016   Procedure: REMOVAL FOREIGN BODY EXTREMITY LEFT FOOT;  Surgeon: Trula Slade, DPM;  Location: [facility];  Service: Podiatry;  Laterality: Left;  . PILONIDAL CYST EXCISION  1990's  . TENDON REPAIR Left 2011   Left Ankle  . WISDOM TOOTH EXTRACTION  19090's    SOCIAL HISTORY: Social History   Tobacco Use  . Smoking status: Former Smoker    Years: 10.00    Types: Cigarettes    Last attempt to quit: 2003    Years since quitting: 16.7  . Smokeless tobacco: Never Used  Substance Use Topics  . Alcohol use: Yes    Alcohol/week: 0.0 standard drinks    Comment: 1-2 per month   . Drug use: No    FAMILY HISTORY: Family History  Problem Relation Age of Onset  . Cancer Mother        squamous cell carcinoma  . Hyperlipidemia Mother   . Depression Mother   . Anxiety disorder Mother   . Obesity Mother   . Heart disease Father 29       cardiomegaly, CHF; steroid use.  [name] Hyperlipidemia Father   . Hypertension Father   . Mental retardation Father   . High blood pressure Father   . Depression Father   . Anxiety disorder Father   . Obesity Father   . Cancer Maternal Grandmother   . Diabetes Maternal Grandmother   . Heart disease Maternal Grandmother   . Hyperlipidemia Maternal Grandmother   . Hypertension Maternal Grandmother   . Stroke Maternal Grandmother   . Heart disease Paternal Grandmother   . Hypertension Paternal Grandmother   . Heart disease Paternal Grandfather   . Hyperlipidemia Paternal Grandfather   . Mental illness Paternal Grandfather   . Rheum arthritis Sister   . Breast cancer Maternal Aunt   . Thyroid cancer Paternal Aunt     ROS: Review of Systems  Constitutional: Positive for malaise/fatigue and weight loss.  Gastrointestinal: Negative for nausea and vomiting.  Genitourinary: Negative for frequency.  Musculoskeletal:       Negative for  muscle weakness.  Endo/Heme/Allergies: Negative for polydipsia.       Negative for hypoglycemia.     PHYSICAL EXAM: Blood pressure 107/70, pulse 82, temperature 97.6 F (36.4 C), temperature source Oral, height 5\' 5"  (1.651 m), weight 263 lb (119.3 kg), SpO2 99 %. Body mass index is 43.77 kg/m. Physical Exam  Constitutional: She is oriented to person, place, and time. She appears well-developed and well-nourished.  Cardiovascular: Normal rate.  Pulmonary/Chest: Effort normal.  Musculoskeletal: Normal range of motion.  Neurological: She is oriented to person,  place, and time.  Skin: Skin is warm and dry.  Psychiatric: She has a normal mood and affect. Her behavior is normal.  Vitals reviewed.   RECENT LABS AND TESTS: BMET    Component Value Date/Time   NA 135 05/25/2018 0831   K 4.1 05/25/2018 0831   CL 100 05/25/2018 0831   CO2 20 05/25/2018 0831   GLUCOSE 98 05/25/2018 0831   GLUCOSE 97 01/05/2018 0915   BUN 12 05/25/2018 0831   CREATININE 1.06 (H) 05/25/2018 0831   CREATININE 0.95 06/27/2016 0933   CALCIUM 9.1 05/25/2018 0831   GFRNONAA 65 05/25/2018 0831   GFRAA 75 05/25/2018 0831   Lab Results  Component Value Date   HGBA1C 5.3 05/25/2018   HGBA1C 5.4 02/10/2018   HGBA1C 5.5 10/07/2017   HGBA1C 5.1 06/22/2017   HGBA1C 5.3 01/11/2017   Lab Results  Component Value Date   INSULIN 87.4 (H) 05/25/2018   INSULIN 20.3 02/10/2018   INSULIN 15.6 10/07/2017   INSULIN 25.2 (H) 06/22/2017   INSULIN 17.0 01/11/2017   CBC    Component Value Date/Time   WBC 5.5 01/05/2018 0915   RBC 4.02 01/05/2018 0915   HGB 11.1 (L) 01/05/2018 0915   HGB 11.2 06/22/2017 0904   HCT 35.4 (L) 01/05/2018 0915   HCT 34.8 06/22/2017 0904   PLT 285 01/05/2018 0915   PLT 332 11/12/2016 1651   MCV 88.1 01/05/2018 0915   MCV 86 06/22/2017 0904   MCH 27.6 01/05/2018 0915   MCHC 31.4 01/05/2018 0915   RDW 14.4 01/05/2018 0915   RDW 15.7 (H) 06/22/2017 0904   LYMPHSABS 1.5  06/22/2017 0904   MONOABS 891 04/30/2016 0947   EOSABS 0.2 06/22/2017 0904   BASOSABS 0.0 06/22/2017 0904   Iron/TIBC/Ferritin/ %Sat    Component Value Date/Time   IRON 53 08/31/2016 1644   TIBC 280 08/31/2016 1644   FERRITIN 36 08/31/2016 1644   IRONPCTSAT 19 08/31/2016 1644   Lipid Panel     Component Value Date/Time   CHOL 137 05/25/2018 0831   TRIG 194 (H) 05/25/2018 0831   HDL 36 (L) 05/25/2018 0831   CHOLHDL 3.5 02/10/2018 0749   CHOLHDL 4.1 12/01/2015 0914   VLDL 37 (H) 12/01/2015 0914   LDLCALC 62 05/25/2018 0831   Hepatic Function Panel     Component Value Date/Time   PROT 7.1 05/25/2018 0831   ALBUMIN 3.9 05/25/2018 0831   AST 16 05/25/2018 0831   ALT 14 05/25/2018 0831   ALKPHOS 93 05/25/2018 0831   BILITOT <0.2 05/25/2018 0831      Component Value Date/Time   TSH 1.620 08/31/2016 1644   TSH 1.83 12/01/2015 0914   Results for CLODAGH, ODENTHAL (MRN [id number]) as of 06/27/2018 09:31  Ref. Range 05/25/2018 08:31  Vitamin D, 25-Hydroxy Latest Ref Range: 30.0 - 100.0 ng/mL 53.9   ASSESSMENT AND PLAN: Prediabetes - Plan: Dulaglutide (TRULICITY) 5.46 EV/0.3JK SOPN  Vitamin D deficiency  At risk for diabetes mellitus  Class 3 severe obesity with serious comorbidity and body mass index (BMI) of 40.0 to 44.9 in adult, unspecified obesity type ([place])  PLAN:  Vitamin D Deficiency Stehanie was informed that low vitamin D levels contributes to fatigue and are associated with obesity, breast, and colon cancer. She agrees to continue to take prescription Vit D @50 ,000 IU every week and will follow up for routine testing of vitamin D, at least 2-3 times per year. She was informed of the risk of over-replacement of vitamin  D and agrees to not increase her dose unless she discusses this with [name] first.  Pre-Diabetes Aira will continue to work on weight loss, exercise, and decreasing simple carbohydrates in her diet to help decrease the risk of diabetes. We dicussed  metformin including benefits and risks. She was informed that eating too many simple carbohydrates or too many calories at one sitting increases the likelihood of GI side effects. Stephnie will continue metformin and Trulicity 1.96 mg subcutaneously every week #2 pens with no refills. Clarisa agreed to follow up with [name] as directed to monitor her progress.  Diabetes risk counseling Inioluwa was given extended (15 minutes) diabetes prevention counseling today. She is 42 y.o. female and has risk factors for diabetes including obesity and prediabetes. We discussed intensive lifestyle modifications today with an emphasis on weight loss as well as increasing exercise and decreasing simple carbohydrates in her diet.  Obesity Tierany is currently in the action stage of change. As such, her goal is to continue with weight loss efforts She has agreed to portion control better and make smarter food choices, such as increase vegetables and decrease simple carbohydrates  Kiersten has been instructed to work up to a goal of 150 minutes of combined cardio and strengthening exercise per week for weight loss and overall health benefits. We discussed the following Behavioral Modification Strategies today: increasing lean protein intake, increasing vegetables, planning for success, and work on meal planning and easy cooking plans. We discussed that she should work on decreasing donut consumption to 2 times per week and cut out sugary beverages.    Aika has agreed to follow up with our clinic in 2 weeks. She was informed of the importance of frequent follow up visits to maximize her success with intensive lifestyle modifications for her multiple health conditions.   OBESITY BEHAVIORAL INTERVENTION VISIT  Today's visit was #39 Starting weight: 278 lbs Starting date: 08/31/16 Today's weight : 263 lbs Today's date: 06/23/18 Total lbs lost to date: 15 lbs  ASK: We discussed the diagnosis of obesity with Marjie Skiff Bowley today and Bonnie agreed to give [name] permission to discuss obesity behavioral modification therapy today.  ASSESS: Dasie has the diagnosis of obesity and her BMI today is 43.77. Clemencia is in the action stage of change   ADVISE: Shallen was educated on the multiple health risks of obesity as well as the benefit of weight loss to improve her health. She was advised of the need for long term treatment and the importance of lifestyle modifications to improve her current health and to decrease her risk of future health problems.  AGREE: Multiple dietary modification options and treatment options were discussed and  Anusha agreed to follow the recommendations documented in the above note.  ARRANGE: Kenyonna was educated on the importance of frequent visits to treat obesity as outlined per CMS and USPSTF guidelines and agreed to schedule her next follow up appointment today.  I, Doreene Nest, am acting as transcriptionist for Eber Jones, MD.  I have reviewed the above documentation for accuracy and completeness, and I agree with the above. - Ilene Qua, MD

## 2018-06-29 ENCOUNTER — Ambulatory Visit: Payer: 59 | Admitting: Physician Assistant

## 2018-06-29 ENCOUNTER — Encounter: Payer: Self-pay | Admitting: Physician Assistant

## 2018-06-29 VITALS — BP 138/78 | HR 87

## 2018-06-29 DIAGNOSIS — F411 Generalized anxiety disorder: Secondary | ICD-10-CM

## 2018-06-29 DIAGNOSIS — G4700 Insomnia, unspecified: Secondary | ICD-10-CM

## 2018-06-29 DIAGNOSIS — F331 Major depressive disorder, recurrent, moderate: Secondary | ICD-10-CM

## 2018-06-29 DIAGNOSIS — F4310 Post-traumatic stress disorder, unspecified: Secondary | ICD-10-CM

## 2018-06-29 MED ORDER — LAMOTRIGINE 150 MG PO TABS: ORAL_TABLET | ORAL | 0 refills | Status: DC

## 2018-06-29 MED ORDER — PRAZOSIN HCL 2 MG PO CAPS: 4.0000 mg | ORAL_CAPSULE | Freq: Every day | ORAL | 0 refills | Status: DC

## 2018-06-29 MED FILL — PRAZOSIN 2 MG CAPSULE: 2 | 90 days supply | Qty: 180 | Fill #0

## 2018-06-29 MED FILL — lamoTRIgine 150 MG TABS: 150 | 90 days supply | Qty: 180 | Fill #0

## 2018-06-29 NOTE — Progress Notes (Signed)
[facility] Med Check  Patient ID: Caroline Gonzales,  MRN: [id number]  PCP: Patient, No Pcp Per  Date of Evaluation: 06/29/2018 Time spent:15 minutes   HISTORY/CURRENT STATUS: HPI Caroline Gonzales presents for a 6-week med check.  At the last visit she had not been sleeping well at all and was having nightmares.  She was started on praises son and is now on 3 mg.  It is helped a lot in the nightmares went away for a little while.  Over the past couple weeks she has had more however.  They are very scary, sometimes with demons in the dreams.  She will sometimes wake up screaming.  They happen about once or twice a week. We had also increased the Lamictal to help with depressive symptoms.  Overall, she states she is about 30 to 40% better than she was 6 weeks ago.  However she still does not enjoy things very much and does not have a lot of energy.  She really energizes by being at home alone, which is uncommon with her husband and 2 kids.  She has not been missing work except for 1 day in the past month due to the depressive symptoms.  She is seeing a counselor once to twice a week.  We filled out FMLA forms for her but she asked that that be addended because she will be seeing the counselor more often than once to twice a month. The anxiety is still an issue.  Klonopin does help however.  Helps to relax her especially in the evening to help her go to sleep.  She does have panic attacks occasionally but is usually more generalized underlying nervousness that she feels. Denies SI/HI, or hallucinations.  Individual Medical History/ Review of Systems: Changes? :No  Allergies: Penicillins; Tamiflu [oseltamivir phosphate]; Cephalosporins; and Latex  Current Medications:  Current Outpatient Medications:  .  Cholecalciferol (VITAMIN D3) 5000 units CAPS, Take 5,000 Units by mouth at bedtime., Disp: , Rfl:  .  clonazePAM (KLONOPIN) 0.5 MG tablet, Take 1.5 mg by mouth at bedtime., Disp: , Rfl:  .   Dulaglutide (TRULICITY) 2.95 MW/4.1LK SOPN, Inject 0.75 mg into the skin every 7 (seven) days., Disp: 2 pen, Rfl: 0 .  L-methylfolate Calcium 15 MG TABS, Take 15 mg by mouth at bedtime. , Disp: , Rfl:  .  L-Methylfolate-Algae (DEPLIN 15) 15-90.314 MG CAPS, Take by mouth daily., Disp: , Rfl:  .  lamoTRIgine (LAMICTAL) 150 MG tablet, One bid, Disp: 180 tablet, Rfl: 0 .  levonorgestrel (MIRENA) 20 MCG/24HR IUD, 1 each by Intrauterine route once. Place 03/2013, Disp: , Rfl:  .  loratadine (CLARITIN) 10 MG tablet, Take 10 mg by mouth at bedtime. , Disp: , Rfl:  .  metFORMIN (GLUCOPHAGE) 1000 MG tablet, Take 1 tablet (1,000 mg total) by mouth daily with breakfast., Disp: 30 tablet, Rfl: 0 .  Multiple Vitamin (MULTIVITAMIN) tablet, Take 1 tablet by mouth at bedtime. , Disp: , Rfl:  .  pantoprazole (PROTONIX) 40 MG tablet, Take 40 mg by mouth daily., Disp: , Rfl:  .  prazosin (MINIPRESS) 2 MG capsule, Take 2 capsules (4 mg total) by mouth at bedtime., Disp: 180 capsule, Rfl: 0 .  propranolol (INDERAL) 40 MG tablet, TAKE 1 TABLET BY MOUTH TWICE DAILY, Disp: 60 tablet, Rfl: 0 .  QUEtiapine (SEROQUEL) 200 MG tablet, Take 200 mg by mouth at bedtime., Disp: , Rfl:  Medication Side Effects: None  Family Medical/ Social History: Changes? No  MENTAL HEALTH EXAM:  Blood  pressure 138/78, pulse 87.There is no height or weight on file to calculate BMI.  General Appearance: Well Groomed obese  Eye Contact:  Good  Speech:  Clear and Coherent  Volume:  Normal  Mood:  Depressed  Affect:  Depressed  Thought Process:  Goal Directed  Orientation:  Full (Time, Place, and Person)  Thought Content: Logical   Suicidal Thoughts:  No  Homicidal Thoughts:  No  Memory:  Immediate  Judgement:  Good  Insight:  Good  Psychomotor Activity:  Normal  Concentration:  Concentration: Good  Recall:  Good  Fund of Knowledge: Good  Language: Good  Akathisia:  No  AIMS (if indicated): not done  Assets:  Desire for  Improvement  ADL's:  Intact  Cognition: WNL  Prognosis:  Good    DIAGNOSES:    ICD-10-CM   1. PTSD (post-traumatic stress disorder) F43.10   2. Major depressive disorder, recurrent episode, moderate (HCC) F33.1   3. Generalized anxiety disorder F41.1   4. Insomnia, unspecified type G47.00     RECOMMENDATIONS: Increase Prazosin to 4 mg qhs.  Increase Lamictal to 150 mg 1 p.o. twice daily. Continue Seroquel 200 mg nightly, Deplin 15 mg daily, Klonopin 0.5 mg 1-2 twice daily as needed.  She may occasionally take an extra one during the day if needed.  She will try not to take over for most days however.  Also continue propranolol 40 mg 1 twice daily.   She will check on the FMLA forms and if we need to wait the forms again or correct the, she will have them faxed to [name] and we will continue the 2 episodes, 2 days a month if needed to be out due to her diagnosis.  We will change the doctor's appointments/therapy appointments from 1-2 at 2 hours/month up to 4 visits with 3 hours each time as needed.   Continue psychotherapy. Return in 4 weeks or sooner as needed   Donnal Moat, PA-C

## 2018-07-01 ENCOUNTER — Ambulatory Visit (INDEPENDENT_AMBULATORY_CARE_PROVIDER_SITE_OTHER): Payer: 59 | Admitting: Neurology

## 2018-07-01 DIAGNOSIS — G479 Sleep disorder, unspecified: Secondary | ICD-10-CM

## 2018-07-01 DIAGNOSIS — Z6841 Body Mass Index (BMI) 40.0 and over, adult: Secondary | ICD-10-CM

## 2018-07-01 DIAGNOSIS — R0683 Snoring: Secondary | ICD-10-CM

## 2018-07-01 DIAGNOSIS — G4720 Circadian rhythm sleep disorder, unspecified type: Secondary | ICD-10-CM

## 2018-07-01 DIAGNOSIS — G478 Other sleep disorders: Secondary | ICD-10-CM

## 2018-07-01 DIAGNOSIS — G4733 Obstructive sleep apnea (adult) (pediatric): Secondary | ICD-10-CM | POA: Diagnosis not present

## 2018-07-01 DIAGNOSIS — G4730 Sleep apnea, unspecified: Secondary | ICD-10-CM

## 2018-07-01 DIAGNOSIS — F513 Sleepwalking [somnambulism]: Secondary | ICD-10-CM

## 2018-07-01 HISTORY — DX: Sleep apnea, unspecified: G47.30

## 2018-07-05 DIAGNOSIS — F4310 Post-traumatic stress disorder, unspecified: Secondary | ICD-10-CM | POA: Diagnosis not present

## 2018-07-12 ENCOUNTER — Ambulatory Visit
Admission: RE | Admit: 2018-07-12 | Discharge: 2018-07-12 | Disposition: A | Discharge status: Home / Self Care | Payer: 59 | Source: Ambulatory Visit | Attending: Obstetrics and Gynecology | Admitting: Obstetrics and Gynecology

## 2018-07-12 DIAGNOSIS — Z1231 Encounter for screening mammogram for malignant neoplasm of breast: Secondary | ICD-10-CM | POA: Diagnosis not present

## 2018-07-13 ENCOUNTER — Telehealth: Payer: Self-pay

## 2018-07-13 NOTE — Addendum Note (Signed)
Addended by: Star Age on: 07/13/2018 08:22 AM   Modules accepted: Orders

## 2018-07-13 NOTE — Telephone Encounter (Signed)
-----   Message from Star Age, MD sent at 07/13/2018  8:22 AM EDT ----- Patient referred by Lacy Duverney, PA, seen by me on 07/22/17, diagnostic PSG on 07/01/18.    Please call and notify the patient that the recent sleep study did confirm the diagnosis of obstructive sleep apnea. OSA is overall borderline but is moderate in REM sleep with O2 desaturation down to 74% in supine REM. So, OSA is worth treating to see if she feels better after treatment (may help with mood d/o, metabolism, etc). To that end, I recommend treatment for this in the form of autoPAP, which means, that we don't have to bring her back for a second sleep study with CPAP, but will let her try an autoPAP machine at home, through a DME company (of her choice, or as per insurance requirement). The DME representative will educate her on how to use the machine, how to put the mask on, etc. I have placed an order in the chart. Please send referral, talk to patient, send report to referring MD. We will need a FU in sleep clinic for 10 weeks post-PAP set up, please arrange that with me or one of our NPs. Thanks,   Star Age, MD, PhD [facility])

## 2018-07-13 NOTE — Progress Notes (Signed)
Patient referred by Lacy Duverney, PA, seen by me on 07/22/17, diagnostic PSG on 07/01/18.    Please call and notify the patient that the recent sleep study did confirm the diagnosis of obstructive sleep apnea. OSA is overall borderline but is moderate in REM sleep with O2 desaturation down to 74% in supine REM. So, OSA is worth treating to see if she feels better after treatment (may help with mood d/o, metabolism, etc). To that end, I recommend treatment for this in the form of autoPAP, which means, that we don't have to bring her back for a second sleep study with CPAP, but will let her try an autoPAP machine at home, through a DME company (of her choice, or as per insurance requirement). The DME representative will educate her on how to use the machine, how to put the mask on, etc. I have placed an order in the chart. Please send referral, talk to patient, send report to referring MD. We will need a FU in sleep clinic for 10 weeks post-PAP set up, please arrange that with me or one of our NPs. Thanks,   Star Age, MD, PhD [facility])

## 2018-07-13 NOTE — Telephone Encounter (Signed)
Pt returned my call. I advised pt that Dr. Rexene Alberts reviewed their sleep study results and found that pt has borderline osa but it was moderate in her REM sleep with O2 desaturations. Dr. Rexene Alberts recommends that pt start an auto pap. I reviewed PAP compliance expectations with the pt. Pt is agreeable to starting an auto-PAP. I advised pt that an order will be sent to a DME, [facility], and [facility] will call the pt within about one week after they file with the pt's insurance. [facility] will show the pt how to use the machine, fit for masks, and troubleshoot the auto-PAP if needed. A follow up appt was made for insurance purposes with Dr. Rexene Alberts on 09/29/18 at 8:30am. Pt verbalized understanding to arrive 15 minutes early and bring their auto-PAP. A letter with all of this information in it will be mailed to the pt as a reminder. I verified with the pt that the address we have on file is correct. Pt verbalized understanding of results. Pt had no questions at this time but was encouraged to call back if questions arise. I have sent the order to [facility] and have received confirmation that they have received the order.

## 2018-07-13 NOTE — Procedures (Signed)
PATIENT'S NAME:  Caroline Gonzales, Caroline Gonzales DOB:      09/11/1976      MR#:    [id number]     DATE OF RECORDING: 07/01/2018 REFERRING M.D.:  Lacy Duverney, PA-C Study Performed:   Baseline Polysomnogram HISTORY: 42 year old woman with a history of pre-diabetes, vitamin D deficiency, allergies, anxiety, back pain, mood disorder, pneumonia in the past, former smoking and morbid obesity, who reports snoring and excessive daytime somnolence. The patient endorsed the Epworth Sleepiness Scale at 1 points. The patient's weight 260 pounds with a height of 65 (inches), resulting in a BMI of 43.3 kg/m2. The patient's neck circumference measured 15 inches.  CURRENT MEDICATIONS: Vitamin D3, Klonopin, Advil, Insulin, L-Methylfolate calcium, Lamictal, Mirena, Saxenda, Claritin, Multivitamin, Inderal, Seroquel, Bactrin DS   PROCEDURE:  This is a multichannel digital polysomnogram utilizing the Somnostar 11.2 system.  Electrodes and sensors were applied and monitored per AASM Specifications.   EEG, EOG, Chin and Limb EMG, were sampled at 200 Hz.  ECG, Snore and Nasal Pressure, Thermal Airflow, Respiratory Effort, CPAP Flow and Pressure, Oximetry was sampled at 50 Hz. Digital video and audio were recorded.      BASELINE STUDY  Lights Out was at 21:10 and Lights On at 05:02.  Total recording time (TRT) was 472.5 minutes, with a total sleep time (TST) of 437 minutes.   The patient's sleep latency was 18 minutes.  REM latency was 171 minutes, which is delayed. The sleep efficiency was 92.5 %.     SLEEP ARCHITECTURE: WASO (Wake after sleep onset) was 16.5 minutes with minimal sleep fragmentation noted.  There were 11.5 minutes in Stage N1, 368 minutes Stage N2, 0 minutes Stage N3 and 57.5 minutes in Stage REM.  The percentage of Stage N1 was 2.6%, Stage N2 was 84.2%, which is markedly increased, Stage N3 was absent and Stage R (REM sleep) was 13.2%, which is reduced. The arousals were noted as: 39 were spontaneous, 0 were associated  with PLMs, 14 were associated with respiratory events.  RESPIRATORY ANALYSIS:  There were a total of 39 respiratory events:  5 obstructive apneas, 0 central apneas and 0 mixed apneas with a total of 5 apneas and an apnea index (AI) of .7 /hour. There were 34 hypopneas with a hypopnea index of 4.7 /hour. The patient also had 0 respiratory event related arousals (RERAs).      The total APNEA/HYPOPNEA INDEX (AHI) was 5.4 /hour and the total RESPIRATORY DISTURBANCE INDEX was 5.4 /hour.  18 events occurred in REM sleep and 42 events in NREM. The REM AHI was 18.8 /hour, versus a non-REM AHI of 3.3. The patient spent 183 minutes of total sleep time in the supine position and 254 minutes in non-supine.. The supine AHI was 10.5 versus a non-supine AHI of 1.7.  OXYGEN SATURATION & C02:  The Wake baseline 02 saturation was 98%, with the lowest being 75% (during supine REM sleep). Time spent below 89% saturation equaled 17 minutes.   PERIODIC LIMB MOVEMENTS: The patient had a total of 10 Periodic Limb Movements.  The Periodic Limb Movement (PLM) index was 1.4 and the PLM Arousal index was 0/hour.  Audio and video analysis did not show any abnormal or unusual movements, behaviors, phonations or vocalizations. The patient took no bathroom breaks. Intermittent mild to moderate snoring was noted. The EKG was in keeping with normal sinus rhythm (NSR).  Post-study, the patient indicated that sleep was the same as usual.   IMPRESSION:  1. Obstructive Sleep Apnea (OSA)  2. Dysfunctions associated with sleep stages or arousal from sleep  RECOMMENDATIONS:  1. This study demonstrates overall borderline obstructive sleep apnea, moderate in REM sleep with a total AHI of 5.4/hour, REM AHI of 18.8/hour, and O2 nadir of 74%. Given the patient's medical history and sleep related complaints, treatment with positive airway pressure is a reasonable choice and will be offered to the patient. Treatment can be achieved in the form  of autoPAP. Alternatively, a full-night CPAP titration study would allow optimization of therapy, if needed. Other treatment options may include avoidance of supine sleep position along with weight loss, upper airway or jaw surgery in selected patients or the use of an oral appliance in certain patients. ENT evaluation and/or consultation with a maxillofacial surgeon or dentist may be feasible in some instances.    2. Please note that untreated obstructive sleep apnea may carry additional perioperative morbidity. Patients with significant obstructive sleep apnea should receive perioperative PAP therapy and the surgeons and particularly the anesthesiologist should be informed of the diagnosis and the severity of the sleep disordered breathing. 3. This study shows some sleep fragmentation and abnormal sleep stage percentages; these are nonspecific findings and per se do not signify an intrinsic sleep disorder or a cause for the patient's sleep-related symptoms. Causes include (but are not limited to) the first night effect of the sleep study, circadian rhythm disturbances, medication effect or an underlying mood disorder or medical problem.  4. The patient should be cautioned not to drive, work at heights, or operate dangerous or heavy equipment when tired or sleepy. Review and reiteration of good sleep hygiene measures should be pursued with any patient. 5. The patient will be seen in follow-up by Dr. Rexene Alberts at [facility] for discussion of the test results and further management strategies. The referring provider will be notified of the test results.  I certify that I have reviewed the entire raw data recording prior to the issuance of this report in accordance with the Standards of Accreditation of the American Academy of Sleep Medicine (AASM)   Star Age, MD, PhD Diplomat, American Board of Neurology and Sleep Medicine (Neurology and Sleep Medicine)

## 2018-07-13 NOTE — Telephone Encounter (Signed)
I called pt to discuss her sleep study results. No answer, left a message asking her to call me back. 

## 2018-07-14 ENCOUNTER — Ambulatory Visit (INDEPENDENT_AMBULATORY_CARE_PROVIDER_SITE_OTHER): Payer: 59 | Admitting: Family Medicine

## 2018-07-14 VITALS — BP 102/67 | HR 88 | Temp 97.9°F | Ht 65.0 in | Wt 261.0 lb

## 2018-07-14 DIAGNOSIS — F319 Bipolar disorder, unspecified: Secondary | ICD-10-CM

## 2018-07-14 DIAGNOSIS — E8881 Metabolic syndrome: Secondary | ICD-10-CM

## 2018-07-14 DIAGNOSIS — Z6841 Body Mass Index (BMI) 40.0 and over, adult: Secondary | ICD-10-CM | POA: Diagnosis not present

## 2018-07-14 NOTE — Progress Notes (Signed)
Office: [phone]  /  Fax: [phone]   HPI:   Chief Complaint: OBESITY Caroline Gonzales is here to discuss her progress with her obesity treatment plan. She is on the portion control better and make smarter food choices, such as increase vegetables and decrease simple carbohydrates and is following her eating plan approximately 80 % of the time. She states she is exercising 0 minutes 0 times per week. Caroline Gonzales had a sleep study and is going to start with a CPAP. She has stopped eating doughnuts completely.  Her weight is 261 lb (118.4 kg) today and has had a weight loss of 2 pounds in 2 weeks since her last visit since her last visit. She has lost 17 lbs since starting treatment with [name].  Insulin Resistance Caroline Gonzales has a diagnosis of insulin resistance based on her elevated fasting insulin level >5. Although Caroline Gonzales's blood glucose readings are still under good control, insulin resistance puts her at greater risk of metabolic syndrome and diabetes. She is taking metformin currently and is doing well with no GI upset from metformin or Trulicity. She continues to work on diet and exercise to decrease risk of diabetes.  Bipolar disorder Caroline Gonzales is seeing a new therapist and psychiatrist. She has had a new change in medications.  ALLERGIES: Allergies  Allergen Reactions  . Penicillins Shortness Of Breath and Rash    Has patient had a PCN reaction causing immediate rash, facial/tongue/throat swelling, SOB or lightheadedness with hypotension: Yes Has patient had a PCN reaction causing severe rash involving mucus membranes or skin necrosis: Yes Has patient had a PCN reaction that required hospitalization: No Has patient had a PCN reaction occurring within the last 10 years: No If all of the above answers are "NO", then may proceed with Cephalosporin use.   . Tamiflu [Oseltamivir Phosphate] Anaphylaxis  . Cephalosporins Rash  . Latex Rash    MEDICATIONS: Current Outpatient Medications on File  Prior to Visit  Medication Sig Dispense Refill  . Cholecalciferol (VITAMIN D3) 5000 units CAPS Take 5,000 Units by mouth at bedtime.    . clonazePAM (KLONOPIN) 0.5 MG tablet Take 0.5 mg by mouth at bedtime.     . Dulaglutide (TRULICITY) 5.62 BW/3.8LH SOPN Inject 0.75 mg into the skin every 7 (seven) days. 2 pen 0  . L-Methylfolate-Algae (DEPLIN 15) 15-90.314 MG CAPS Take by mouth daily.    [name] lamoTRIgine (LAMICTAL) 150 MG tablet One bid 180 tablet 0  . levonorgestrel (MIRENA) 20 MCG/24HR IUD 1 each by Intrauterine route once. Place 03/2013    . loratadine (CLARITIN) 10 MG tablet Take 10 mg by mouth at bedtime.     . metFORMIN (GLUCOPHAGE) 1000 MG tablet Take 1 tablet (1,000 mg total) by mouth daily with breakfast. 30 tablet 0  . Multiple Vitamin (MULTIVITAMIN) tablet Take 1 tablet by mouth at bedtime.     . pantoprazole (PROTONIX) 40 MG tablet Take 40 mg by mouth daily.    . prazosin (MINIPRESS) 2 MG capsule Take 2 capsules (4 mg total) by mouth at bedtime. 180 capsule 0  . propranolol (INDERAL) 40 MG tablet TAKE 1 TABLET BY MOUTH TWICE DAILY 60 tablet 0  . QUEtiapine (SEROQUEL) 200 MG tablet Take 200 mg by mouth at bedtime.     No current facility-administered medications on file prior to visit.     PAST MEDICAL HISTORY: Past Medical History:  Diagnosis Date  . Allergy    Zyrtec, Allegra.  . Anxiety    followed by Dr. Toy Cookey  .  Back pain   . Bipolar 1 disorder ([place])   . Complication of anesthesia   . Depression   . Fibroid   . Gallbladder problem   . Gastroparesis 09/14/2012   gastric emptying study in 2014  . GERD (gastroesophageal reflux disease)   . Pneumonia    2013ish  . PONV (postoperative nausea and vomiting)   . Pre-diabetes   . PTSD (post-traumatic stress disorder)   . Vitamin D deficiency     PAST SURGICAL HISTORY: Past Surgical History:  Procedure Laterality Date  . ANKLE ARTHROSCOPY Left 2011  . CESAREAN SECTION    . CHOLECYSTECTOMY    . DILATION AND  CURETTAGE OF UTERUS    . FOREIGN BODY REMOVAL Left 11/18/2016   Procedure: REMOVAL FOREIGN BODY EXTREMITY LEFT FOOT;  Surgeon: Trula Slade, DPM;  Location: [facility];  Service: Podiatry;  Laterality: Left;  . PILONIDAL CYST EXCISION  1990's  . TENDON REPAIR Left 2011   Left Ankle  . WISDOM TOOTH EXTRACTION  19090's    SOCIAL HISTORY: Social History   Tobacco Use  . Smoking status: Former Smoker    Years: 10.00    Types: Cigarettes    Last attempt to quit: 2003    Years since quitting: 16.8  . Smokeless tobacco: Never Used  Substance Use Topics  . Alcohol use: Yes    Alcohol/week: 0.0 standard drinks    Comment: 1-2 per month   . Drug use: No    FAMILY HISTORY: Family History  Problem Relation Age of Onset  . Cancer Mother        squamous cell carcinoma  . Hyperlipidemia Mother   . Depression Mother   . Anxiety disorder Mother   . Obesity Mother   . Heart disease Father 69       cardiomegaly, CHF; steroid use.  [name] Hyperlipidemia Father   . Hypertension Father   . Mental retardation Father   . High blood pressure Father   . Depression Father   . Anxiety disorder Father   . Obesity Father   . Cancer Maternal Grandmother   . Diabetes Maternal Grandmother   . Heart disease Maternal Grandmother   . Hyperlipidemia Maternal Grandmother   . Hypertension Maternal Grandmother   . Stroke Maternal Grandmother   . Heart disease Paternal Grandmother   . Hypertension Paternal Grandmother   . Heart disease Paternal Grandfather   . Hyperlipidemia Paternal Grandfather   . Mental illness Paternal Grandfather   . Rheum arthritis Sister   . Breast cancer Maternal Aunt   . Thyroid cancer Paternal Aunt     ROS: Review of Systems  Constitutional: Positive for weight loss.  Gastrointestinal: Negative for nausea and vomiting.    PHYSICAL EXAM: Blood pressure 102/67, pulse 88, temperature 97.9 F (36.6 C), temperature source Oral, height 5\' 5"  (1.651 m), weight 261 lb (118.4  kg), SpO2 99 %. Body mass index is 43.43 kg/m. Physical Exam  Constitutional: She is oriented to person, place, and time. She appears well-developed and well-nourished.  Cardiovascular: Normal rate.  Pulmonary/Chest: Effort normal.  Musculoskeletal: Normal range of motion.  Neurological: She is oriented to person, place, and time.  Skin: Skin is warm and dry.  Psychiatric: She has a normal mood and affect. Her behavior is normal.  Vitals reviewed.   RECENT LABS AND TESTS: BMET    Component Value Date/Time   NA 135 05/25/2018 0831   K 4.1 05/25/2018 0831   CL 100 05/25/2018 0831  CO2 20 05/25/2018 0831   GLUCOSE 98 05/25/2018 0831   GLUCOSE 97 01/05/2018 0915   BUN 12 05/25/2018 0831   CREATININE 1.06 (H) 05/25/2018 0831   CREATININE 0.95 06/27/2016 0933   CALCIUM 9.1 05/25/2018 0831   GFRNONAA 65 05/25/2018 0831   GFRAA 75 05/25/2018 0831   Lab Results  Component Value Date   HGBA1C 5.3 05/25/2018   HGBA1C 5.4 02/10/2018   HGBA1C 5.5 10/07/2017   HGBA1C 5.1 06/22/2017   HGBA1C 5.3 01/11/2017   Lab Results  Component Value Date   INSULIN 87.4 (H) 05/25/2018   INSULIN 20.3 02/10/2018   INSULIN 15.6 10/07/2017   INSULIN 25.2 (H) 06/22/2017   INSULIN 17.0 01/11/2017   CBC    Component Value Date/Time   WBC 5.5 01/05/2018 0915   RBC 4.02 01/05/2018 0915   HGB 11.1 (L) 01/05/2018 0915   HGB 11.2 06/22/2017 0904   HCT 35.4 (L) 01/05/2018 0915   HCT 34.8 06/22/2017 0904   PLT 285 01/05/2018 0915   PLT 332 11/12/2016 1651   MCV 88.1 01/05/2018 0915   MCV 86 06/22/2017 0904   MCH 27.6 01/05/2018 0915   MCHC 31.4 01/05/2018 0915   RDW 14.4 01/05/2018 0915   RDW 15.7 (H) 06/22/2017 0904   LYMPHSABS 1.5 06/22/2017 0904   MONOABS 891 04/30/2016 0947   EOSABS 0.2 06/22/2017 0904   BASOSABS 0.0 06/22/2017 0904   Iron/TIBC/Ferritin/ %Sat    Component Value Date/Time   IRON 53 08/31/2016 1644   TIBC 280 08/31/2016 1644   FERRITIN 36 08/31/2016 1644    IRONPCTSAT 19 08/31/2016 1644   Lipid Panel     Component Value Date/Time   CHOL 137 05/25/2018 0831   TRIG 194 (H) 05/25/2018 0831   HDL 36 (L) 05/25/2018 0831   CHOLHDL 3.5 02/10/2018 0749   CHOLHDL 4.1 12/01/2015 0914   VLDL 37 (H) 12/01/2015 0914   LDLCALC 62 05/25/2018 0831   Hepatic Function Panel     Component Value Date/Time   PROT 7.1 05/25/2018 0831   ALBUMIN 3.9 05/25/2018 0831   AST 16 05/25/2018 0831   ALT 14 05/25/2018 0831   ALKPHOS 93 05/25/2018 0831   BILITOT <0.2 05/25/2018 0831      Component Value Date/Time   TSH 1.620 08/31/2016 1644   TSH 1.83 12/01/2015 0914   Results for CALIANNA, KIM (MRN [id number]) as of 07/14/2018 13:41  Ref. Range 05/25/2018 08:31  Vitamin D, 25-Hydroxy Latest Ref Range: 30.0 - 100.0 ng/mL 53.9   ASSESSMENT AND PLAN: Insulin resistance  Bipolar 1 disorder (HCC)  Class 3 severe obesity with serious comorbidity and body mass index (BMI) of 40.0 to 44.9 in adult, unspecified obesity type ([place])  PLAN:  Insulin Resistance Katria will continue to work on weight loss, exercise, and decreasing simple carbohydrates in her diet to help decrease the risk of diabetes. She was informed that eating too many simple carbohydrates or too many calories at one sitting increases the likelihood of GI side effects. Caroline Gonzales agreed to continue her current medications as prescribed. Caroline Gonzales agreed to follow up with [name] as directed to monitor her progress in 2 weeks.  Bipolar disorder Caroline Gonzales agrees to continue with her current medications. She will follow up with her psychiatrist as previously scheduled.  I spent > than 50% of the 15 minute visit on counseling as documented in the note.  Obesity Caroline Gonzales is currently in the action stage of change. As such, her goal is to continue with weight  loss efforts She has agreed to portion control better and make smarter food choices, such as increase vegetables and decrease simple carbohydrates    Caroline Gonzales has been instructed to work up to a goal of 150 minutes of combined cardio and strengthening exercise per week for weight loss and overall health benefits. We discussed the following Behavioral Modification Strategies today: increasing lean protein intake, increasing vegetables, work on meal planning and easy cooking plans, planning for success.   Caroline Gonzales has agreed to follow up with our clinic in 2 weeks. She was informed of the importance of frequent follow up visits to maximize her success with intensive lifestyle modifications for her multiple health conditions.   OBESITY BEHAVIORAL INTERVENTION VISIT  Today's visit was # 40  Starting weight: 278 lbs Starting date: 08/31/16 Today's weight : Weight: 261 lb (118.4 kg)  Today's date: 07/14/2018 Total lbs lost to date: 76  ASK: We discussed the diagnosis of obesity with Caroline Gonzales today and Caroline Gonzales agreed to give [name] permission to discuss obesity behavioral modification therapy today.  ASSESS: Caroline Gonzales has the diagnosis of obesity and her BMI today is 43.43. Caroline Gonzales is in the action stage of change.   ADVISE: Caroline Gonzales was educated on the multiple health risks of obesity as well as the benefit of weight loss to improve her health. She was advised of the need for long term treatment and the importance of lifestyle modifications to improve her current health and to decrease her risk of future health problems.  AGREE: Multiple dietary modification options and treatment options were discussed and Lucienne agreed to follow the recommendations documented in the above note.  ARRANGE: Itati was educated on the importance of frequent visits to treat obesity as outlined per CMS and USPSTF guidelines and agreed to schedule her next follow up appointment today.  I, Marcille Blanco, am acting as Location manager for Eber Jones, MD  I have reviewed the above documentation for accuracy and completeness, and I agree with the  above. - Ilene Qua, MD

## 2018-07-18 ENCOUNTER — Other Ambulatory Visit: Payer: Self-pay | Admitting: Physician Assistant

## 2018-07-18 DIAGNOSIS — F4310 Post-traumatic stress disorder, unspecified: Secondary | ICD-10-CM | POA: Diagnosis not present

## 2018-07-18 MED FILL — QUETIAPINE FUMARATE 200 MG: 200 | 90 days supply | Qty: 90 | Fill #1

## 2018-07-18 MED FILL — PANTOPRAZOLE SOD DR 40 MG T: 40 | 30 days supply | Qty: 30 | Fill #0

## 2018-07-19 ENCOUNTER — Ambulatory Visit: Payer: 59 | Admitting: Physician Assistant

## 2018-07-19 NOTE — Telephone Encounter (Signed)
Looking for paper chart

## 2018-07-21 ENCOUNTER — Encounter: Payer: Self-pay | Admitting: Emergency Medicine

## 2018-07-21 DIAGNOSIS — F4310 Post-traumatic stress disorder, unspecified: Secondary | ICD-10-CM

## 2018-07-21 DIAGNOSIS — F411 Generalized anxiety disorder: Secondary | ICD-10-CM | POA: Insufficient documentation

## 2018-07-21 DIAGNOSIS — F3341 Major depressive disorder, recurrent, in partial remission: Secondary | ICD-10-CM | POA: Insufficient documentation

## 2018-07-21 DIAGNOSIS — F515 Nightmare disorder: Secondary | ICD-10-CM | POA: Insufficient documentation

## 2018-07-21 DIAGNOSIS — F329 Major depressive disorder, single episode, unspecified: Secondary | ICD-10-CM | POA: Insufficient documentation

## 2018-07-21 DIAGNOSIS — G4700 Insomnia, unspecified: Secondary | ICD-10-CM

## 2018-07-21 HISTORY — DX: Nightmare disorder: F51.5

## 2018-07-21 MED FILL — PROPRANOLOL 40 MG TABLET: 40 | 30 days supply | Qty: 60 | Fill #0

## 2018-07-25 ENCOUNTER — Other Ambulatory Visit (INDEPENDENT_AMBULATORY_CARE_PROVIDER_SITE_OTHER): Payer: Self-pay | Admitting: Family Medicine

## 2018-07-25 ENCOUNTER — Encounter (INDEPENDENT_AMBULATORY_CARE_PROVIDER_SITE_OTHER): Payer: Self-pay | Admitting: Family Medicine

## 2018-07-25 DIAGNOSIS — R7303 Prediabetes: Secondary | ICD-10-CM

## 2018-07-26 ENCOUNTER — Other Ambulatory Visit: Payer: Self-pay | Admitting: Physician Assistant

## 2018-07-26 MED FILL — TRULICITY 0.75 MG/0.5 ML PE: 0.75 | 28 days supply | Qty: 2 | Fill #0

## 2018-07-27 MED FILL — clonazePAM 0.5 MG TABS: 0.5 | 30 days supply | Qty: 120 | Fill #0

## 2018-07-28 ENCOUNTER — Ambulatory Visit (INDEPENDENT_AMBULATORY_CARE_PROVIDER_SITE_OTHER): Payer: 59 | Admitting: Family Medicine

## 2018-07-28 VITALS — BP 109/74 | HR 90 | Temp 97.8°F | Ht 65.0 in | Wt 257.0 lb

## 2018-07-28 DIAGNOSIS — R7303 Prediabetes: Secondary | ICD-10-CM | POA: Diagnosis not present

## 2018-07-28 DIAGNOSIS — Z6841 Body Mass Index (BMI) 40.0 and over, adult: Secondary | ICD-10-CM | POA: Diagnosis not present

## 2018-07-28 DIAGNOSIS — Z9189 Other specified personal risk factors, not elsewhere classified: Secondary | ICD-10-CM

## 2018-07-28 DIAGNOSIS — E559 Vitamin D deficiency, unspecified: Secondary | ICD-10-CM | POA: Diagnosis not present

## 2018-07-28 MED ORDER — METFORMIN HCL 1000 MG PO TABS: 1000.0000 mg | ORAL_TABLET | Freq: Every day | ORAL | 0 refills | Status: DC

## 2018-07-28 MED FILL — metFORMIN HCL 1000 MG TABS: 1000 | 30 days supply | Qty: 30 | Fill #0

## 2018-07-30 DIAGNOSIS — G4733 Obstructive sleep apnea (adult) (pediatric): Secondary | ICD-10-CM | POA: Diagnosis not present

## 2018-08-01 DIAGNOSIS — F4310 Post-traumatic stress disorder, unspecified: Secondary | ICD-10-CM | POA: Diagnosis not present

## 2018-08-03 NOTE — Progress Notes (Signed)
Office: [phone]  /  Fax: [phone]   HPI:   Chief Complaint: OBESITY Caroline Gonzales is here to discuss her progress with her obesity treatment plan. She is on the portion control better and make smarter food choices, such as increase vegetables and decrease simple carbohydrates and is following her eating plan approximately 90 % of the time. She states she is exercising 0 minutes 0 times per week. Caroline Gonzales feels that life is getting significantly less stressful. She is struggling occasionally with making good choices for food secondary to emotional eating. She has given up eating out at breakfast.  Her weight is 257 lb (116.6 kg) today and has had a weight loss of 4 pounds over a period of 2 weeks since her last visit. She has lost 21 lbs since starting treatment with [name].  Pre-Diabetes Caroline Gonzales has a diagnosis of pre-diabetes based on her elevated Hgb A1c and was informed this puts her at greater risk of developing diabetes. She is doing well on metformin and Trulicity, and she notes carbohydrate cravings at night. She continues to work on diet and exercise to decrease risk of diabetes. She denies nausea or hypoglycemia.  At risk for diabetes Caroline Gonzales is at higher than average risk for developing diabetes due to her obesity and pre-diabetes. She currently denies polyuria or polydipsia.  Vitamin D Deficiency Caroline Gonzales has a diagnosis of vitamin D deficiency. She is currently taking OTC Vit D. She notes fatigue and denies nausea, vomiting or muscle weakness.  ALLERGIES: Allergies  Allergen Reactions  . Penicillins Shortness Of Breath and Rash    Has patient had a PCN reaction causing immediate rash, facial/tongue/throat swelling, SOB or lightheadedness with hypotension: Yes Has patient had a PCN reaction causing severe rash involving mucus membranes or skin necrosis: Yes Has patient had a PCN reaction that required hospitalization: No Has patient had a PCN reaction occurring within the last  10 years: No If all of the above answers are "NO", then may proceed with Cephalosporin use.   . Tamiflu [Oseltamivir Phosphate] Anaphylaxis  . Cephalosporins Rash  . Latex Rash    MEDICATIONS: Current Outpatient Medications on File Prior to Visit  Medication Sig Dispense Refill  . Cholecalciferol (VITAMIN D3) 5000 units CAPS Take 5,000 Units by mouth at bedtime.    . clonazePAM (KLONOPIN) 0.5 MG tablet TAKE 1 TO 2 TABLETS BY MOUTH TWICE DAILY AS NEEDED 120 tablet 0  . L-Methylfolate-Algae (DEPLIN 15) 15-90.314 MG CAPS Take by mouth daily.    Caroline Gonzales lamoTRIgine (LAMICTAL) 150 MG tablet One bid 180 tablet 0  . levonorgestrel (MIRENA) 20 MCG/24HR IUD 1 each by Intrauterine route once. Place 03/2013    . loratadine (CLARITIN) 10 MG tablet Take 10 mg by mouth at bedtime.     . Multiple Vitamin (MULTIVITAMIN) tablet Take 1 tablet by mouth at bedtime.     . pantoprazole (PROTONIX) 40 MG tablet Take 40 mg by mouth daily.    . prazosin (MINIPRESS) 2 MG capsule Take 2 capsules (4 mg total) by mouth at bedtime. 180 capsule 0  . propranolol (INDERAL) 40 MG tablet TAKE 1 TABLET BY MOUTH TWICE DAILY 60 tablet 0  . QUEtiapine (SEROQUEL) 200 MG tablet Take 200 mg by mouth at bedtime.    . TRULICITY 4.00 QQ/7.6PP SOPN INJECT 0.75 MG INTO THE SKIN EVERY 7 DAYS. 2 mL 0   No current facility-administered medications on file prior to visit.     PAST MEDICAL HISTORY: Past Medical History:  Diagnosis Date  .  Allergy    Zyrtec, Allegra.  . Anxiety    followed by Dr. Toy Cookey  . Back pain   . Bipolar 1 disorder ([place])   . Complication of anesthesia   . Depression   . Fibroid   . Gallbladder problem   . Gastroparesis 09/14/2012   gastric emptying study in 2014  . GERD (gastroesophageal reflux disease)   . Pneumonia    2013ish  . PONV (postoperative nausea and vomiting)   . Pre-diabetes   . PTSD (post-traumatic stress disorder)   . Vitamin D deficiency     PAST SURGICAL HISTORY: Past Surgical History:   Procedure Laterality Date  . ANKLE ARTHROSCOPY Left 2011  . CESAREAN SECTION    . CHOLECYSTECTOMY    . DILATION AND CURETTAGE OF UTERUS    . FOREIGN BODY REMOVAL Left 11/18/2016   Procedure: REMOVAL FOREIGN BODY EXTREMITY LEFT FOOT;  Surgeon: Trula Slade, DPM;  Location: [facility];  Service: Podiatry;  Laterality: Left;  . PILONIDAL CYST EXCISION  1990's  . TENDON REPAIR Left 2011   Left Ankle  . WISDOM TOOTH EXTRACTION  19090's    SOCIAL HISTORY: Social History   Tobacco Use  . Smoking status: Former Smoker    Years: 10.00    Types: Cigarettes    Last attempt to quit: 2003    Years since quitting: 16.8  . Smokeless tobacco: Never Used  Substance Use Topics  . Alcohol use: Yes    Alcohol/week: 0.0 standard drinks    Comment: 1-2 per month   . Drug use: No    FAMILY HISTORY: Family History  Problem Relation Age of Onset  . Cancer Mother        squamous cell carcinoma  . Hyperlipidemia Mother   . Depression Mother   . Anxiety disorder Mother   . Obesity Mother   . Heart disease Father 58       cardiomegaly, CHF; steroid use.  Caroline Gonzales Hyperlipidemia Father   . Hypertension Father   . Mental retardation Father   . High blood pressure Father   . Depression Father   . Anxiety disorder Father   . Obesity Father   . Cancer Maternal Grandmother   . Diabetes Maternal Grandmother   . Heart disease Maternal Grandmother   . Hyperlipidemia Maternal Grandmother   . Hypertension Maternal Grandmother   . Stroke Maternal Grandmother   . Heart disease Paternal Grandmother   . Hypertension Paternal Grandmother   . Heart disease Paternal Grandfather   . Hyperlipidemia Paternal Grandfather   . Mental illness Paternal Grandfather   . Rheum arthritis Sister   . Breast cancer Maternal Aunt   . Thyroid cancer Paternal Aunt     ROS: Review of Systems  Constitutional: Positive for malaise/fatigue and weight loss.  Gastrointestinal: Negative for nausea and vomiting.  Genitourinary:  Negative for frequency.  Musculoskeletal:       Negative muscle weakness  Endo/Heme/Allergies: Negative for polydipsia.       Negative hypoglycemia    PHYSICAL EXAM: Blood pressure 109/74, pulse 90, temperature 97.8 F (36.6 C), temperature source Oral, height 5\' 5"  (1.651 m), weight 257 lb (116.6 kg), SpO2 99 %. Body mass index is 42.77 kg/m. Physical Exam  Constitutional: She is oriented to person, place, and time. She appears well-developed and well-nourished.  Cardiovascular: Normal rate.  Pulmonary/Chest: Effort normal.  Musculoskeletal: Normal range of motion.  Neurological: She is oriented to person, place, and time.  Skin: Skin is warm and dry.  Psychiatric: She has a normal mood and affect. Her behavior is normal.  Vitals reviewed.   RECENT LABS AND TESTS: BMET    Component Value Date/Time   NA 135 05/25/2018 0831   K 4.1 05/25/2018 0831   CL 100 05/25/2018 0831   CO2 20 05/25/2018 0831   GLUCOSE 98 05/25/2018 0831   GLUCOSE 97 01/05/2018 0915   BUN 12 05/25/2018 0831   CREATININE 1.06 (H) 05/25/2018 0831   CREATININE 0.95 06/27/2016 0933   CALCIUM 9.1 05/25/2018 0831   GFRNONAA 65 05/25/2018 0831   GFRAA 75 05/25/2018 0831   Lab Results  Component Value Date   HGBA1C 5.3 05/25/2018   HGBA1C 5.4 02/10/2018   HGBA1C 5.5 10/07/2017   HGBA1C 5.1 06/22/2017   HGBA1C 5.3 01/11/2017   Lab Results  Component Value Date   INSULIN 87.4 (H) 05/25/2018   INSULIN 20.3 02/10/2018   INSULIN 15.6 10/07/2017   INSULIN 25.2 (H) 06/22/2017   INSULIN 17.0 01/11/2017   CBC    Component Value Date/Time   WBC 5.5 01/05/2018 0915   RBC 4.02 01/05/2018 0915   HGB 11.1 (L) 01/05/2018 0915   HGB 11.2 06/22/2017 0904   HCT 35.4 (L) 01/05/2018 0915   HCT 34.8 06/22/2017 0904   PLT 285 01/05/2018 0915   PLT 332 11/12/2016 1651   MCV 88.1 01/05/2018 0915   MCV 86 06/22/2017 0904   MCH 27.6 01/05/2018 0915   MCHC 31.4 01/05/2018 0915   RDW 14.4 01/05/2018 0915   RDW  15.7 (H) 06/22/2017 0904   LYMPHSABS 1.5 06/22/2017 0904   MONOABS 891 04/30/2016 0947   EOSABS 0.2 06/22/2017 0904   BASOSABS 0.0 06/22/2017 0904   Iron/TIBC/Ferritin/ %Sat    Component Value Date/Time   IRON 53 08/31/2016 1644   TIBC 280 08/31/2016 1644   FERRITIN 36 08/31/2016 1644   IRONPCTSAT 19 08/31/2016 1644   Lipid Panel     Component Value Date/Time   CHOL 137 05/25/2018 0831   TRIG 194 (H) 05/25/2018 0831   HDL 36 (L) 05/25/2018 0831   CHOLHDL 3.5 02/10/2018 0749   CHOLHDL 4.1 12/01/2015 0914   VLDL 37 (H) 12/01/2015 0914   LDLCALC 62 05/25/2018 0831   Hepatic Function Panel     Component Value Date/Time   PROT 7.1 05/25/2018 0831   ALBUMIN 3.9 05/25/2018 0831   AST 16 05/25/2018 0831   ALT 14 05/25/2018 0831   ALKPHOS 93 05/25/2018 0831   BILITOT <0.2 05/25/2018 0831      Component Value Date/Time   TSH 1.620 08/31/2016 1644   TSH 1.83 12/01/2015 0914   Results for ETHELEAN, COLLA (MRN [id number]) as of 08/04/2018 08:21  Ref. Range 05/25/2018 08:31  Vitamin D, 25-Hydroxy Latest Ref Range: 30.0 - 100.0 ng/mL 53.9   ASSESSMENT AND PLAN: Prediabetes - Plan: metFORMIN (GLUCOPHAGE) 1000 MG tablet  Vitamin D deficiency  At risk for diabetes mellitus  Class 3 severe obesity with serious comorbidity and body mass index (BMI) of 40.0 to 44.9 in adult, unspecified obesity type ([place])  Insulin resistance  PLAN:  Pre-Diabetes Rosezella will continue to work on weight loss, exercise, and decreasing simple carbohydrates in her diet to help decrease the risk of diabetes. We dicussed metformin including benefits and risks. She was informed that eating too many simple carbohydrates or too many calories at one sitting increases the likelihood of GI side effects. Truly agrees to continue taking metformin 1,000 mg q AM #30 and we will refill for  1 month. Mailynn agrees to follow up with our clinic in 2 weeks as directed to monitor her progress.  Diabetes risk  counselling Ilynn was given extended (15 minutes) diabetes prevention counseling today. She is 42 y.o. female and has risk factors for diabetes including obesity and pre-diabetes. We discussed intensive lifestyle modifications today with an emphasis on weight loss as well as increasing exercise and decreasing simple carbohydrates in her diet.  Vitamin D Deficiency Janani was informed that low vitamin D levels contributes to fatigue and are associated with obesity, breast, and colon cancer. Alysa agrees to continue taking OTC Vit D supplementation and will follow up for routine testing of vitamin D, at least 2-3 times per year. She was informed of the risk of over-replacement of vitamin D and agrees to not increase her dose unless she discusses this with [name] first. Karaline agrees to follow up with our clinic in 2 weeks.  Obesity Philana is currently in the action stage of change. As such, her goal is to continue with weight loss efforts She has agreed to portion control better and make smarter food choices, such as increase vegetables and decrease simple carbohydrates  Lexxus has been instructed to work up to a goal of 150 minutes of combined cardio and strengthening exercise per week for weight loss and overall health benefits. We discussed the following Behavioral Modification Strategies today: increasing lean protein intake, increasing vegetables, work on meal planning and easy cooking plans, and planning for success   Indira has agreed to follow up with our clinic in 2 weeks. She was informed of the importance of frequent follow up visits to maximize her success with intensive lifestyle modifications for her multiple health conditions.   OBESITY BEHAVIORAL INTERVENTION VISIT  Today's visit was # 42   Starting weight: 278 lbs Starting date: 08/31/16 Today's weight : 257 lbs Today's date: 07/28/2018 Total lbs lost to date: 21    ASK: We discussed the diagnosis of obesity with  Marjie Skiff Odem today and [employer] agreed to give [name] permission to discuss obesity behavioral modification therapy today.  ASSESS: Shenise has the diagnosis of obesity and her BMI today is 42.77 Jayleana is in the action stage of change   ADVISE: Zeinab was educated on the multiple health risks of obesity as well as the benefit of weight loss to improve her health. She was advised of the need for long term treatment and the importance of lifestyle modifications to improve her current health and to decrease her risk of future health problems.  AGREE: Multiple dietary modification options and treatment options were discussed and  Niaomi agreed to follow the recommendations documented in the above note.  ARRANGE: Janeisha was educated on the importance of frequent visits to treat obesity as outlined per CMS and USPSTF guidelines and agreed to schedule her next follow up appointment today.  I, Trixie Dredge, am acting as transcriptionist for Ilene Qua, MD  I have reviewed the above documentation for accuracy and completeness, and I agree with the above. - Ilene Qua, MD

## 2018-08-08 ENCOUNTER — Encounter: Payer: Self-pay | Admitting: Physician Assistant

## 2018-08-08 ENCOUNTER — Ambulatory Visit: Payer: 59 | Admitting: Physician Assistant

## 2018-08-08 VITALS — BP 127/81 | HR 88

## 2018-08-08 DIAGNOSIS — G4733 Obstructive sleep apnea (adult) (pediatric): Secondary | ICD-10-CM | POA: Diagnosis not present

## 2018-08-08 DIAGNOSIS — F331 Major depressive disorder, recurrent, moderate: Secondary | ICD-10-CM | POA: Diagnosis not present

## 2018-08-08 DIAGNOSIS — F4310 Post-traumatic stress disorder, unspecified: Secondary | ICD-10-CM

## 2018-08-08 MED ORDER — CLONAZEPAM 0.5 MG PO TABS: 0.5000 mg | ORAL_TABLET | Freq: Four times a day (QID) | ORAL | 2 refills | Status: DC | PRN

## 2018-08-08 NOTE — Progress Notes (Signed)
[facility] Med Check  Patient ID: Caroline Gonzales,  MRN: [id number]  PCP: Patient, No Pcp Per  Date of Evaluation: 08/08/2018 Time spent:15 minutes  Chief Complaint:  Chief Complaint    Follow-up      HISTORY/CURRENT STATUS: HPI  Here for 5 week med check.  We increased the Prazosin and the Lamictal. States she feels like her meds are right now. Overall, doing better.  Has had some family issues that have been very stressful.  Her step-dtr is moving to [place] to live with her mother.  That will happen this weekend.  The patient's husband is grieving over that change.  Plus pt's sister went missing and was found 1000 miles away.  She had been suicidal but was found to be ok.  "It's been a whole ordeal.  But I'm taking time for myself right now.  I know I need to or else I am not going to be able to help anybody else."  Work has been stressful but okay.   Has been dx with OSA.  Using CPAP but not working well yet.  Having a hard time getting used to it.  The nightmares are much better since we increased the praises son.  Individual Medical History/ Review of Systems: Changes? :No    Past medications for mental health diagnoses include: Prozac, Paxil, Effexor, Norpramin, Wellbutrin, doxepin, Elavil, Geodon, Abilify, Lexapro, Trileptal, Ambien, Lunesta, Sonata, trazodone, Restoril, Remeron, Seroquel, Klonopin, propranolol, Lamictal, Deplin, Sonata,  Allergies: Penicillins; Tamiflu [oseltamivir phosphate]; Cephalosporins; and Latex  Current Medications:  Current Outpatient Medications:  .  Cholecalciferol (VITAMIN D3) 5000 units CAPS, Take 5,000 Units by mouth at bedtime., Disp: , Rfl:  .  clonazePAM (KLONOPIN) 0.5 MG tablet, Take 1 tablet (0.5 mg total) by mouth every 6 (six) hours as needed for anxiety., Disp: 120 tablet, Rfl: 2 .  L-Methylfolate-Algae (DEPLIN 15) 15-90.314 MG CAPS, Take by mouth daily., Disp: , Rfl:  .  lamoTRIgine (LAMICTAL) 150 MG tablet, One bid, Disp: 180  tablet, Rfl: 0 .  levonorgestrel (MIRENA) 20 MCG/24HR IUD, 1 each by Intrauterine route once. Place 03/2013, Disp: , Rfl:  .  loratadine (CLARITIN) 10 MG tablet, Take 10 mg by mouth at bedtime. , Disp: , Rfl:  .  metFORMIN (GLUCOPHAGE) 1000 MG tablet, Take 1 tablet (1,000 mg total) by mouth daily with breakfast., Disp: 30 tablet, Rfl: 0 .  Multiple Vitamin (MULTIVITAMIN) tablet, Take 1 tablet by mouth at bedtime. , Disp: , Rfl:  .  pantoprazole (PROTONIX) 40 MG tablet, Take 40 mg by mouth daily., Disp: , Rfl:  .  prazosin (MINIPRESS) 2 MG capsule, Take 2 capsules (4 mg total) by mouth at bedtime., Disp: 180 capsule, Rfl: 0 .  propranolol (INDERAL) 40 MG tablet, TAKE 1 TABLET BY MOUTH TWICE DAILY, Disp: 60 tablet, Rfl: 0 .  QUEtiapine (SEROQUEL) 200 MG tablet, Take 200 mg by mouth at bedtime., Disp: , Rfl:  .  TRULICITY 6.96 VE/9.3YB SOPN, INJECT 0.75 MG INTO THE SKIN EVERY 7 DAYS., Disp: 2 mL, Rfl: 0 Medication Side Effects: none  Family Medical/ Social History: Changes? No  MENTAL HEALTH EXAM:  Blood pressure 127/81, pulse 88.There is no height or weight on file to calculate BMI.  General Appearance: Casual and Obese  Eye Contact:  Good  Speech:  Clear and Coherent  Volume:  Normal  Mood:  Euthymic  Affect:  Appropriate  Thought Process:  Goal Directed  Orientation:  Full (Time, Place, and Person)  Thought Content: Logical  Suicidal Thoughts:  No  Homicidal Thoughts:  No  Memory:  WNL  Judgement:  Good  Insight:  Good  Psychomotor Activity:  Normal  Concentration:  Concentration: Good  Recall:  Good  Fund of Knowledge: Good  Language: Good  Assets:  Desire for Improvement  ADL's:  Intact  Cognition: WNL  Prognosis:  Good    DIAGNOSES:    ICD-10-CM   1. PTSD (post-traumatic stress disorder) F43.10   2. Major depressive disorder, recurrent episode, moderate (HCC) F33.1   3. Obstructive sleep apnea G47.33     Receiving Psychotherapy: Yes   RECOMMENDATIONS: I am glad  to see her doing so much better. Continue all current medications as noted above. Continue psychotherapy. Return in 3 months or sooner as needed.   Donnal Moat, PA-C

## 2018-08-15 DIAGNOSIS — F4310 Post-traumatic stress disorder, unspecified: Secondary | ICD-10-CM | POA: Diagnosis not present

## 2018-08-16 ENCOUNTER — Ambulatory Visit (INDEPENDENT_AMBULATORY_CARE_PROVIDER_SITE_OTHER): Payer: 59 | Admitting: Family Medicine

## 2018-08-16 ENCOUNTER — Encounter (INDEPENDENT_AMBULATORY_CARE_PROVIDER_SITE_OTHER): Payer: Self-pay | Admitting: Family Medicine

## 2018-08-16 VITALS — BP 113/72 | HR 57 | Temp 98.0°F | Ht 65.0 in | Wt 259.0 lb

## 2018-08-16 DIAGNOSIS — E88819 Insulin resistance, unspecified: Secondary | ICD-10-CM

## 2018-08-16 DIAGNOSIS — E8881 Metabolic syndrome: Secondary | ICD-10-CM

## 2018-08-16 DIAGNOSIS — Z6841 Body Mass Index (BMI) 40.0 and over, adult: Secondary | ICD-10-CM

## 2018-08-16 DIAGNOSIS — E66813 Obesity, class 3: Secondary | ICD-10-CM

## 2018-08-16 DIAGNOSIS — F319 Bipolar disorder, unspecified: Secondary | ICD-10-CM | POA: Diagnosis not present

## 2018-08-16 DIAGNOSIS — Z9189 Other specified personal risk factors, not elsewhere classified: Secondary | ICD-10-CM | POA: Diagnosis not present

## 2018-08-16 MED ORDER — DULAGLUTIDE 0.75 MG/0.5ML ~~LOC~~ SOAJ: SUBCUTANEOUS | 0 refills | Status: DC

## 2018-08-17 MED FILL — TRULICITY 0.75 MG/0.5 ML PE: 0.75 | 28 days supply | Qty: 2 | Fill #0

## 2018-08-17 MED FILL — PANTOPRAZOLE SOD DR 40 MG T: 40 | 30 days supply | Qty: 30 | Fill #1

## 2018-08-22 ENCOUNTER — Other Ambulatory Visit: Payer: Self-pay | Admitting: Physician Assistant

## 2018-08-22 NOTE — Progress Notes (Signed)
Office: [phone]  /  Fax: [phone]   HPI:   Chief Complaint: OBESITY Alonnah is here to discuss her progress with her obesity treatment plan. She is on the portion control better and make smarter food choices, such as increase vegetables and decrease simple carbohydrates and is following her eating plan approximately 70 % of the time. She states she is exercising 0 minutes 0 times per week. Tajanay voices a difficult few weeks. Her sister went missing for a few days and had Thanksgiving holiday. She has been eating out at lunch secondary to doctor appointments. Her mother is coming into town in a little over a week.  Her weight is 259 lb (117.5 kg) today and has gained 2 pounds since her last visit. She has lost 19 lbs since starting treatment with [name].  Insulin Resistance Marin has a diagnosis of insulin resistance based on her elevated fasting insulin level >5. Although Norinne's blood glucose readings are still under good control, insulin resistance puts her at greater risk of metabolic syndrome and diabetes. She denies carbohydrate cravings, but she is skipping meals frequently. She continues to work on diet and exercise to decrease risk of diabetes.  At risk for diabetes Jadaya is at higher than average risk for developing diabetes due to her obesity and insulin resistance. She currently denies polyuria or polydipsia.  Bipolar Disorder Idara has bipolar disorder. She sees a Social worker and psychiatrist, and she is seeing both providers next week. Her medications have been consistent since last appointment.   ALLERGIES: Allergies  Allergen Reactions  . Penicillins Shortness Of Breath and Rash    Has patient had a PCN reaction causing immediate rash, facial/tongue/throat swelling, SOB or lightheadedness with hypotension: Yes Has patient had a PCN reaction causing severe rash involving mucus membranes or skin necrosis: Yes Has patient had a PCN reaction that required  hospitalization: No Has patient had a PCN reaction occurring within the last 10 years: No If all of the above answers are "NO", then may proceed with Cephalosporin use.   . Tamiflu [Oseltamivir Phosphate] Anaphylaxis  . Cephalosporins Rash  . Latex Rash    MEDICATIONS: Current Outpatient Medications on File Prior to Visit  Medication Sig Dispense Refill  . Cholecalciferol (VITAMIN D3) 5000 units CAPS Take 5,000 Units by mouth at bedtime.    . clonazePAM (KLONOPIN) 0.5 MG tablet Take 1 tablet (0.5 mg total) by mouth every 6 (six) hours as needed for anxiety. 120 tablet 2  . L-Methylfolate-Algae (DEPLIN 15) 15-90.314 MG CAPS Take by mouth daily.    [name] lamoTRIgine (LAMICTAL) 150 MG tablet One bid 180 tablet 0  . levonorgestrel (MIRENA) 20 MCG/24HR IUD 1 each by Intrauterine route once. Place 03/2013    . loratadine (CLARITIN) 10 MG tablet Take 10 mg by mouth at bedtime.     . metFORMIN (GLUCOPHAGE) 1000 MG tablet Take 1 tablet (1,000 mg total) by mouth daily with breakfast. 30 tablet 0  . Multiple Vitamin (MULTIVITAMIN) tablet Take 1 tablet by mouth at bedtime.     . pantoprazole (PROTONIX) 40 MG tablet Take 40 mg by mouth daily.    . prazosin (MINIPRESS) 2 MG capsule Take 2 capsules (4 mg total) by mouth at bedtime. 180 capsule 0  . propranolol (INDERAL) 40 MG tablet TAKE 1 TABLET BY MOUTH TWICE DAILY 60 tablet 0  . QUEtiapine (SEROQUEL) 200 MG tablet Take 200 mg by mouth at bedtime.     No current facility-administered medications on file prior to visit.  PAST MEDICAL HISTORY: Past Medical History:  Diagnosis Date  . Allergy    Zyrtec, Allegra.  . Anxiety    followed by Dr. Toy Cookey  . Back pain   . Bipolar 1 disorder ([place])   . Complication of anesthesia   . Depression   . Fibroid   . Gallbladder problem   . Gastroparesis 09/14/2012   gastric emptying study in 2014  . GERD (gastroesophageal reflux disease)   . Pneumonia    2013ish  . PONV (postoperative nausea and vomiting)    . Pre-diabetes   . PTSD (post-traumatic stress disorder)   . Vitamin D deficiency     PAST SURGICAL HISTORY: Past Surgical History:  Procedure Laterality Date  . ANKLE ARTHROSCOPY Left 2011  . CESAREAN SECTION    . CHOLECYSTECTOMY    . DILATION AND CURETTAGE OF UTERUS    . FOREIGN BODY REMOVAL Left 11/18/2016   Procedure: REMOVAL FOREIGN BODY EXTREMITY LEFT FOOT;  Surgeon: Trula Slade, DPM;  Location: [facility];  Service: Podiatry;  Laterality: Left;  . PILONIDAL CYST EXCISION  1990's  . TENDON REPAIR Left 2011   Left Ankle  . WISDOM TOOTH EXTRACTION  19090's    SOCIAL HISTORY: Social History   Tobacco Use  . Smoking status: Former Smoker    Years: 10.00    Types: Cigarettes    Last attempt to quit: 2003    Years since quitting: 16.9  . Smokeless tobacco: Never Used  Substance Use Topics  . Alcohol use: Yes    Alcohol/week: 0.0 standard drinks    Comment: 1-2 q 32months maybe  . Drug use: No    FAMILY HISTORY: Family History  Problem Relation Age of Onset  . Cancer Mother        squamous cell carcinoma  . Hyperlipidemia Mother   . Depression Mother   . Anxiety disorder Mother   . Obesity Mother   . Heart disease Father 20       cardiomegaly, CHF; steroid use.  [name] Hyperlipidemia Father   . Hypertension Father   . Mental retardation Father   . High blood pressure Father   . Depression Father   . Anxiety disorder Father   . Obesity Father   . Cancer Maternal Grandmother   . Diabetes Maternal Grandmother   . Heart disease Maternal Grandmother   . Hyperlipidemia Maternal Grandmother   . Hypertension Maternal Grandmother   . Stroke Maternal Grandmother   . Heart disease Paternal Grandmother   . Hypertension Paternal Grandmother   . Heart disease Paternal Grandfather   . Hyperlipidemia Paternal Grandfather   . Mental illness Paternal Grandfather   . Rheum arthritis Sister   . Breast cancer Maternal Aunt   . Thyroid cancer Paternal Aunt     ROS: Review  of Systems  Constitutional: Negative for weight loss.  Genitourinary: Negative for frequency.  Endo/Heme/Allergies: Negative for polydipsia.    PHYSICAL EXAM: Blood pressure 113/72, pulse (!) 57, temperature 98 F (36.7 C), temperature source Oral, height 5\' 5"  (1.651 m), weight 259 lb (117.5 kg), SpO2 95 %. Body mass index is 43.1 kg/m. Physical Exam  Constitutional: She is oriented to person, place, and time. She appears well-developed and well-nourished.  Cardiovascular: Normal rate.  Pulmonary/Chest: Effort normal.  Musculoskeletal: Normal range of motion.  Neurological: She is oriented to person, place, and time.  Skin: Skin is warm and dry.  Psychiatric: She has a normal mood and affect. Her behavior is normal.  Vitals reviewed.  RECENT LABS AND TESTS: BMET    Component Value Date/Time   NA 135 05/25/2018 0831   K 4.1 05/25/2018 0831   CL 100 05/25/2018 0831   CO2 20 05/25/2018 0831   GLUCOSE 98 05/25/2018 0831   GLUCOSE 97 01/05/2018 0915   BUN 12 05/25/2018 0831   CREATININE 1.06 (H) 05/25/2018 0831   CREATININE 0.95 06/27/2016 0933   CALCIUM 9.1 05/25/2018 0831   GFRNONAA 65 05/25/2018 0831   GFRAA 75 05/25/2018 0831   Lab Results  Component Value Date   HGBA1C 5.3 05/25/2018   HGBA1C 5.4 02/10/2018   HGBA1C 5.5 10/07/2017   HGBA1C 5.1 06/22/2017   HGBA1C 5.3 01/11/2017   Lab Results  Component Value Date   INSULIN 87.4 (H) 05/25/2018   INSULIN 20.3 02/10/2018   INSULIN 15.6 10/07/2017   INSULIN 25.2 (H) 06/22/2017   INSULIN 17.0 01/11/2017   CBC    Component Value Date/Time   WBC 5.5 01/05/2018 0915   RBC 4.02 01/05/2018 0915   HGB 11.1 (L) 01/05/2018 0915   HGB 11.2 06/22/2017 0904   HCT 35.4 (L) 01/05/2018 0915   HCT 34.8 06/22/2017 0904   PLT 285 01/05/2018 0915   PLT 332 11/12/2016 1651   MCV 88.1 01/05/2018 0915   MCV 86 06/22/2017 0904   MCH 27.6 01/05/2018 0915   MCHC 31.4 01/05/2018 0915   RDW 14.4 01/05/2018 0915   RDW 15.7  (H) 06/22/2017 0904   LYMPHSABS 1.5 06/22/2017 0904   MONOABS 891 04/30/2016 0947   EOSABS 0.2 06/22/2017 0904   BASOSABS 0.0 06/22/2017 0904   Iron/TIBC/Ferritin/ %Sat    Component Value Date/Time   IRON 53 08/31/2016 1644   TIBC 280 08/31/2016 1644   FERRITIN 36 08/31/2016 1644   IRONPCTSAT 19 08/31/2016 1644   Lipid Panel     Component Value Date/Time   CHOL 137 05/25/2018 0831   TRIG 194 (H) 05/25/2018 0831   HDL 36 (L) 05/25/2018 0831   CHOLHDL 3.5 02/10/2018 0749   CHOLHDL 4.1 12/01/2015 0914   VLDL 37 (H) 12/01/2015 0914   LDLCALC 62 05/25/2018 0831   Hepatic Function Panel     Component Value Date/Time   PROT 7.1 05/25/2018 0831   ALBUMIN 3.9 05/25/2018 0831   AST 16 05/25/2018 0831   ALT 14 05/25/2018 0831   ALKPHOS 93 05/25/2018 0831   BILITOT <0.2 05/25/2018 0831      Component Value Date/Time   TSH 1.620 08/31/2016 1644   TSH 1.83 12/01/2015 0914    ASSESSMENT AND PLAN: Prediabetes - Plan: Dulaglutide (TRULICITY) 5.18 AC/1.6SA SOPN  Bipolar 1 disorder (H[facility])  At risk for diabetes mellitus  Class 3 severe obesity with serious comorbidity and body mass index (BMI) of 40.0 to 44.9 in adult, unspecified obesity type ([place])  PLAN:  Insulin Resistance Delmar will continue to work on weight loss, exercise, and decreasing simple carbohydrates in her diet to help decrease the risk of diabetes. We dicussed metformin including benefits and risks. She was informed that eating too many simple carbohydrates or too many calories at one sitting increases the likelihood of GI side effects. Kadeisha agrees to continue Trulicity 6.30 mg SubQ q weekly #2 pens and we will refill for 1 month. Jurline agrees to follow up with our clinic in 2 weeks as directed to monitor her progress.  Diabetes risk counselling Jozelyn was given extended (15 minutes) diabetes prevention counseling today. She is 42 y.o. female and has risk factors for diabetes including obesity  and insulin  resistance. We discussed intensive lifestyle modifications today with an emphasis on weight loss as well as increasing exercise and decreasing simple carbohydrates in her diet.  Bipolar Disorder Mindie is to follow up with providers at previously scheduled appointments. Malak agrees to follow up with our clinic in 2 weeks.  Obesity Robertine is currently in the action stage of change. As such, her goal is to continue with weight loss efforts She has agreed to portion control better and make smarter food choices, such as increase vegetables and decrease simple carbohydrates  Pierrette has been instructed to work up to a goal of 150 minutes of combined cardio and strengthening exercise per week for weight loss and overall health benefits. We discussed the following Behavioral Modification Strategies today: decrease eating out, holiday eating strategies, travel eating strategies, and no skipping meals   Lizvet has agreed to follow up with our clinic in 2 weeks. She was informed of the importance of frequent follow up visits to maximize her success with intensive lifestyle modifications for her multiple health conditions.   OBESITY BEHAVIORAL INTERVENTION VISIT  Today's visit was # 76   Starting weight: 278 lbs Starting date: 08/31/16 Today's weight : 259 lbs Today's date: 08/16/2018 Total lbs lost to date: 85    ASK: We discussed the diagnosis of obesity with Marjie Skiff Ocain today and Khayla agreed to give [name] permission to discuss obesity behavioral modification therapy today.  ASSESS: Magnolia has the diagnosis of obesity and her BMI today is 43.1 Evolette is in the action stage of change   ADVISE: Christinia was educated on the multiple health risks of obesity as well as the benefit of weight loss to improve her health. She was advised of the need for long term treatment and the importance of lifestyle modifications to improve her current health and to decrease her risk of future  health problems.  AGREE: Multiple dietary modification options and treatment options were discussed and  Teffany agreed to follow the recommendations documented in the above note.  ARRANGE: Charon was educated on the importance of frequent visits to treat obesity as outlined per CMS and USPSTF guidelines and agreed to schedule her next follow up appointment today.  I, Trixie Dredge, am acting as transcriptionist for Ilene Qua, MD  I have reviewed the above documentation for accuracy and completeness, and I agree with the above. - Ilene Qua, MD

## 2018-08-24 MED FILL — clonazePAM 0.5 MG TABS: 0.5 | 30 days supply | Qty: 120 | Fill #0

## 2018-08-29 DIAGNOSIS — F4310 Post-traumatic stress disorder, unspecified: Secondary | ICD-10-CM | POA: Diagnosis not present

## 2018-08-29 DIAGNOSIS — G4733 Obstructive sleep apnea (adult) (pediatric): Secondary | ICD-10-CM | POA: Diagnosis not present

## 2018-08-29 NOTE — Progress Notes (Signed)
Interpretation: -OCT Glaucoma Eye: OU Cooperation: good Reliability: good Indication: for testing: Glaucoma suspect Interpretation: OD: Signal : R: 6/10, ON: 7/10, Good foveal contour, no SRF/IRF.  EZ grossly intact, GCL thinning, No significant focal RNFL thinning, Stable to prior studies.  No impact on treatment.  Continue to monitor. OS: Signal strength: R: 5/10, ON: 6/10, Good foveal contour, no SRF/IRF.  EZ grossly intact, GCL thinning, No significant focal RNFL thinning,  Stable to prior studies.  No impact on treatment.  Continue to monitor.

## 2018-08-31 ENCOUNTER — Telehealth: Payer: Self-pay | Admitting: Neurology

## 2018-08-31 MED FILL — PROPRANOLOL 40 MG TABLET: 40 | 30 days supply | Qty: 60 | Fill #0

## 2018-08-31 NOTE — Telephone Encounter (Signed)
Pt is asking for a call re: upcoming CPAP initial appointment.  Pt has had CPAP since 11-16th but on 12-30 that will be her 3rd fitting of a mask for proper fitting.  Pt is asking RN call to advise if she should still come or at least sleep under CPAP a consistent 30 days.  Please call

## 2018-08-31 NOTE — Telephone Encounter (Signed)
I called pt. I offered her an appt on 10/13/18 at 8:30am with Dr. Rexene Alberts. Pt started cpap on 07/30/18 and should be ok on 10/13/18 because that falls 31-90 days after start. Pt verbalized understanding of new appt date and time.

## 2018-09-12 ENCOUNTER — Encounter (HOSPITAL_COMMUNITY): Payer: Self-pay | Admitting: Emergency Medicine

## 2018-09-12 ENCOUNTER — Ambulatory Visit (HOSPITAL_COMMUNITY)
Admission: EM | Admit: 2018-09-12 | Discharge: 2018-09-12 | Disposition: A | Discharge status: Home / Self Care | Payer: 59 | Attending: Nurse Practitioner | Admitting: Nurse Practitioner

## 2018-09-12 ENCOUNTER — Ambulatory Visit: Payer: Self-pay

## 2018-09-12 DIAGNOSIS — W19XXXA Unspecified fall, initial encounter: Secondary | ICD-10-CM | POA: Diagnosis not present

## 2018-09-12 DIAGNOSIS — R42 Dizziness and giddiness: Secondary | ICD-10-CM | POA: Insufficient documentation

## 2018-09-12 DIAGNOSIS — R5383 Other fatigue: Secondary | ICD-10-CM

## 2018-09-12 LAB — POCT I-STAT, CHEM 8
BUN: 11 mg/dL (ref 6–20)
Calcium, Ion: 1.1 mmol/L — ABNORMAL LOW (ref 1.15–1.40)
Chloride: 102 mmol/L (ref 98–111)
Creatinine, Ser: 0.9 mg/dL (ref 0.44–1.00)
Glucose, Bld: 87 mg/dL (ref 70–99)
HCT: 39 % (ref 36.0–46.0)
Hemoglobin: 13.3 g/dL (ref 12.0–15.0)
Potassium: 4 mmol/L (ref 3.5–5.1)
Sodium: 137 mmol/L (ref 135–145)
TCO2: 31 mmol/L (ref 22–32)

## 2018-09-12 LAB — POCT URINALYSIS DIP (DEVICE)
Bilirubin Urine: NEGATIVE
Glucose, UA: NEGATIVE mg/dL
Hgb urine dipstick: NEGATIVE
Ketones, ur: NEGATIVE mg/dL
Leukocytes, UA: NEGATIVE
Nitrite: NEGATIVE
Protein, ur: NEGATIVE mg/dL
Specific Gravity, Urine: 1.025 (ref 1.005–1.030)
Urobilinogen, UA: 0.2 mg/dL (ref 0.0–1.0)
pH: 6 (ref 5.0–8.0)

## 2018-09-12 NOTE — ED Triage Notes (Signed)
Pt sts increased weakness and fatigue with fall today from toilet; pt denies hitting head or LOC

## 2018-09-12 NOTE — Discharge Instructions (Signed)
Your vital signs, EKG, blood work and urine were all normal.  I am unsure why you are having dizziness.    You need to schedule an appointment with your neurologist as soon as possible for follow-up.    Go to the ED immediately if:  You have problems talking, walking, swallowing, or using your arms, hands, or legs. You are not thinking clearly or you have trouble forming sentences. You have chest pain, abdominal pain, shortness of breath, or sweating. Your vision changes. You have any bleeding. You have a severe headache. You have neck pain or a stiff neck. You have a fever.

## 2018-09-12 NOTE — Telephone Encounter (Signed)
Pt. Reports she has noticed being dizzy x 2 weeks and it is "getting worse." Reports it is worse while sitting still. VS have been stable per pt.No vertigo or chest pain or palpitations. States she was sitting on the commode this morning and fell off the commode.Instructed pt. She needs to be seen today. Offered an 11:00 slot. She is unable to make this "due to work." Encourage pt. To be seen today. [facility] to [facility]. Reason for Disposition . [1] MODERATE dizziness (e.g., interferes with normal activities) AND [2] has NOT been evaluated by physician for this  (Exception: dizziness caused by heat exposure, sudden standing, or poor fluid intake)  Answer Assessment - Initial Assessment Questions 1. DESCRIPTION: "Describe your dizziness."     Lightheadedness 2. LIGHTHEADED: "Do you feel lightheaded?" (e.g., somewhat faint, woozy, weak upon standing)     Off balance 3. VERTIGO: "Do you feel like either you or the room is spinning or tilting?" (i.e. vertigo)     No 4. SEVERITY: "How bad is it?"  "Do you feel like you are going to faint?" "Can you stand and walk?"   - MILD - walking normally   - MODERATE - interferes with normal activities (e.g., work, school)    - SEVERE - unable to stand, requires support to walk, feels like passing out now.      Moderate 5. ONSET:  "When did the dizziness begin?"     2 weeks ago and getting worse 6. AGGRAVATING FACTORS: "Does anything make it worse?" (e.g., standing, change in head position)     Worse when sitting 7. HEART RATE: "Can you tell me your heart rate?" "How many beats in 15 seconds?"  (Note: not all patients can do this)       BP  120/70  PULSE   90 8. CAUSE: "What do you think is causing the dizziness?"     Unsure 9. RECURRENT SYMPTOM: "Have you had dizziness before?" If so, ask: "When was the last time?" "What happened that time?"     No 10. OTHER SYMPTOMS: "Do you have any other symptoms?" (e.g., fever, chest pain, vomiting, diarrhea, bleeding)      No 11. PREGNANCY: "Is there any chance you are pregnant?" "When was your last menstrual period?"       No  Protocols used: DIZZINESS [facility]

## 2018-09-12 NOTE — ED Provider Notes (Signed)
[place]    CSN: [id number] Arrival date & time: 09/12/18  1513     History   Chief Complaint Chief Complaint  Patient presents with  . Fall    appt 1530    HPI Caroline Gonzales is a 42 y.o. female.   Subjective:  Caroline Gonzales is a 42 y.o. female who is here for evaluation of dizziness. The dizziness has been present for several weeks but increased in frequency and worsened in intensity over the past week or so. She also complains of extreme fatigue. She describes the symptoms as disequalibirum and lightheadedness. Symptoms are exacerbated by bending over but often times not exacerbated by anything at. She denies any chest pain, palpitations, shortness of breath, headache, visual disturbances, limb/facial paresthesias, sweats, nausea, vomiting, otalgia or tinnitus.  She is never had anything like this happen to her before.  Her symptoms caused her to fall earlier today which prompted her to seek evaluation.  With the fall, she denies hitting her head or any loss of consciousness.  He has not tried anything for her symptoms.  Last menstrual cycle was over 6 years ago as she has an IUD.  She is on several medications for depression/anxiety.  She has been on these therapies for several years.  Denies any new medications.  The following portions of the patient's history were reviewed and updated as appropriate: allergies, current medications, past family history, past medical history, past social history, past surgical history and problem list.       Past Medical History:  Diagnosis Date  . Allergy    Zyrtec, Allegra.  . Anxiety    followed by Dr. Toy Cookey  . Back pain   . Bipolar 1 disorder ([place])   . Complication of anesthesia   . Depression   . Fibroid   . Gallbladder problem   . Gastroparesis 09/14/2012   gastric emptying study in 2014  . GERD (gastroesophageal reflux disease)   . Pneumonia    2013ish  . PONV (postoperative nausea and vomiting)   .  Pre-diabetes   . PTSD (post-traumatic stress disorder)   . Vitamin D deficiency     Patient Active Problem List   Diagnosis Date Noted  . PTSD (post-traumatic stress disorder) 07/21/2018  . GAD (generalized anxiety disorder) 07/21/2018  . Insomnia 07/21/2018  . MDD (major depressive disorder) 07/21/2018  . Nightmares 07/21/2018  . Insulin resistance 10/21/2017  . Other hyperlipidemia 10/07/2017  . Vitamin D deficiency 10/07/2017  . Bipolar depression ([place]) 04/27/2017  . Class 3 obesity with serious comorbidity and body mass index (BMI) of 40.0 to 44.9 in adult 04/27/2017  . Residual foreign body in soft tissue 11/12/2016  . Prediabetes 11/02/2016  . Morbid obesity ([place]) 10/15/2016  . Bipolar 1 disorder [facility])     Past Surgical History:  Procedure Laterality Date  . ANKLE ARTHROSCOPY Left 2011  . CESAREAN SECTION    . CHOLECYSTECTOMY    . DILATION AND CURETTAGE OF UTERUS    . FOREIGN BODY REMOVAL Left 11/18/2016   Procedure: REMOVAL FOREIGN BODY EXTREMITY LEFT FOOT;  Surgeon: Trula Slade, DPM;  Location: [place];  Service: Podiatry;  Laterality: Left;  . PILONIDAL CYST EXCISION  1990's  . TENDON REPAIR Left 2011   Left Ankle  . WISDOM TOOTH EXTRACTION  19090's    OB History    Gravida  3   Para  1   Term  1   Preterm  0   AB  0   Living  0     SAB  0   TAB  0   Ectopic  0   Multiple  0   Live Births  0            Home Medications    Prior to Admission medications   Medication Sig Start Date End Date Taking? Authorizing Provider  Cholecalciferol (VITAMIN D3) 5000 units CAPS Take 5,000 Units by mouth at bedtime.    [provider]  clonazePAM (KLONOPIN) 0.5 MG tablet Take 1 tablet (0.5 mg total) by mouth every 6 (six) hours as needed for anxiety. 08/08/18   Donnal Moat T, PA-C  Dulaglutide (TRULICITY) 2.99 BZ/1.6RC SOPN INJECT 0.75 MG INTO THE SKIN EVERY 7 DAYS. 08/16/18   Eber Jones, MD  L-Methylfolate-Algae (DEPLIN 15)  [phone] MG CAPS Take by mouth daily. 05/25/18   [provider]  lamoTRIgine (LAMICTAL) 150 MG tablet One bid 06/29/18   Donnal Moat T, PA-C  levonorgestrel (MIRENA) 20 MCG/24HR IUD 1 each by Intrauterine route once. Place 03/2013    [provider]  loratadine (CLARITIN) 10 MG tablet Take 10 mg by mouth at bedtime.     [provider]  metFORMIN (GLUCOPHAGE) 1000 MG tablet Take 1 tablet (1,000 mg total) by mouth daily with breakfast. 07/28/18   Eber Jones, MD  Multiple Vitamin (MULTIVITAMIN) tablet Take 1 tablet by mouth at bedtime.     [provider]  pantoprazole (PROTONIX) 40 MG tablet Take 40 mg by mouth daily.    [provider]  prazosin (MINIPRESS) 2 MG capsule Take 2 capsules (4 mg total) by mouth at bedtime. 06/29/18   Donnal Moat T, PA-C  propranolol (INDERAL) 40 MG tablet TAKE 1 TABLET BY MOUTH TWICE DAILY 08/22/18   Donnal Moat T, PA-C  QUEtiapine (SEROQUEL) 200 MG tablet Take 200 mg by mouth at bedtime.    [provider]    Family History Family History  Problem Relation Age of Onset  . Cancer Mother        squamous cell carcinoma  . Hyperlipidemia Mother   . Depression Mother   . Anxiety disorder Mother   . Obesity Mother   . Heart disease Father 19       cardiomegaly, CHF; steroid use.  [name] Hyperlipidemia Father   . Hypertension Father   . Mental retardation Father   . High blood pressure Father   . Depression Father   . Anxiety disorder Father   . Obesity Father   . Cancer Maternal Grandmother   . Diabetes Maternal Grandmother   . Heart disease Maternal Grandmother   . Hyperlipidemia Maternal Grandmother   . Hypertension Maternal Grandmother   . Stroke Maternal Grandmother   . Heart disease Paternal Grandmother   . Hypertension Paternal Grandmother   . Heart disease Paternal Grandfather   . Hyperlipidemia Paternal Grandfather   . Mental illness Paternal Grandfather   . Rheum arthritis  Sister   . Breast cancer Maternal Aunt   . Thyroid cancer Paternal Aunt     Social History Social History   Tobacco Use  . Smoking status: Former Smoker    Years: 10.00    Types: Cigarettes    Last attempt to quit: 2003    Years since quitting: 17.0  . Smokeless tobacco: Never Used  Substance Use Topics  . Alcohol use: Yes    Alcohol/week: 0.0 standard drinks    Comment: 1-2 q 29months maybe  .  Drug use: No     Allergies   Penicillins; Tamiflu [oseltamivir phosphate]; Cephalosporins; and Latex   Review of Systems Review of Systems  Constitutional: Positive for fatigue. Negative for chills, diaphoresis and fever.  HENT: Negative.   Eyes: Negative.   Respiratory: Negative.   Cardiovascular: Negative.   Gastrointestinal: Negative.   Genitourinary: Negative.   Neurological: Positive for dizziness. Negative for tremors, seizures, syncope, facial asymmetry, speech difficulty, weakness, numbness and headaches.  All other systems reviewed and are negative.    Physical Exam Triage Vital Signs ED Triage Vitals  Enc Vitals Group     BP 09/12/18 1554 (!) 152/91     Pulse Rate 09/12/18 1554 87     Resp 09/12/18 1554 18     Temp 09/12/18 1554 98.1 F (36.7 C)     Temp Source 09/12/18 1554 Oral     SpO2 09/12/18 1554 100 %     Weight --      Height --      Head Circumference --      Peak Flow --      Pain Score 09/12/18 1555 0     Pain Loc --      Pain Edu? --      Excl. in [employer]? --    Orthostatic VS for the past 24 hrs:  BP- Lying Pulse- Lying BP- Sitting Pulse- Sitting BP- Standing at 0 minutes Pulse- Standing at 0 minutes  09/12/18 1652 133/85 80 145/88 84 148/85 85    Updated Vital Signs BP (!) 152/91 (BP Location: Right Arm)   Pulse 87   Temp 98.1 F (36.7 C) (Oral)   Resp 18   SpO2 100%   Visual Acuity Right Eye Distance:   Left Eye Distance:   Bilateral Distance:    Right Eye Near:   Left Eye Near:    Bilateral Near:     Physical  Exam Constitutional:      General: She is not in acute distress.    Appearance: Normal appearance. She is not ill-appearing or toxic-appearing.  HENT:     Head: Normocephalic.     Right Ear: Tympanic membrane, ear canal and external ear normal.     Left Ear: Tympanic membrane, ear canal and external ear normal.  Neck:     Musculoskeletal: Normal range of motion.  Cardiovascular:     Rate and Rhythm: Normal rate and regular rhythm.  Pulmonary:     Effort: Pulmonary effort is normal.     Breath sounds: Normal breath sounds.  Musculoskeletal: Normal range of motion.  Lymphadenopathy:     Cervical: No cervical adenopathy.  Skin:    General: Skin is warm and dry.  Neurological:     General: No focal deficit present.     Mental Status: She is alert.     Cranial Nerves: Cranial nerves are intact. No cranial nerve deficit.     Sensory: Sensation is intact. No sensory deficit.     Motor: Motor function is intact.     Coordination: Coordination is intact.     Gait: Gait is intact.  Psychiatric:        Mood and Affect: Mood normal.        Behavior: Behavior normal.        Thought Content: Thought content normal.        Judgment: Judgment normal.      UC Treatments / Results  Labs (all labs ordered are listed, but only abnormal results are displayed)  Labs Reviewed  POCT I-STAT, CHEM 8 - Abnormal; Notable for the following components:      Result Value   Calcium, Ion 1.10 (*)    All other components within normal limits  I-STAT CHEM 8, ED  POCT URINALYSIS DIP (DEVICE)    EKG None  Radiology No results found.  Procedures Procedures (including critical care time)  Medications Ordered in UC Medications - No data to display  Initial Impression / Assessment and Plan / UC Course  I have reviewed the triage vital signs and the nursing notes.  Pertinent labs & imaging results that were available during my care of the patient were reviewed by me and considered in my medical  decision making (see chart for details).      42 year old female presenting with worsening dizziness and fatigue over the past several weeks.  The dizziness caused her to have a fall earlier today which prompted her to seek evaluation today. She denies any chest pain, palpitations, shortness of breath, headache, visual disturbances, limb/facial paresthesias, sweats, nausea, vomiting, otalgia or tinnitus. Unclear etiology of patient's symptoms. No focal deficits noted on exam. Urinalysis and I-stat negative for anything acute. EKG unremarkable. Negative orthostatic. Psychiatric disorders may be the primary cause of nonspecific dizziness. Patient been on a number of medications for the past several years for depression, anxiety, bipolar disorder and PTSD. Patient will need to follow-up with neurology as soon as possible. Discussed indication for immediate ED follow-up. Patient agreeable.   Today's evaluation has revealed no signs of a dangerous process. Discussed diagnosis with patient. Patient aware of their diagnosis, possible red flag symptoms to watch out for and need for close follow up. Patient understands verbal and written discharge instructions. Patient comfortable with plan and disposition.  Patient has a clear mental status at this time, good insight into illness (after discussion and teaching) and has clear judgment to make decisions regarding their care.  Documentation was completed with the aid of voice recognition software. Transcription may contain typographical errors.  Final Clinical Impressions(s) / UC Diagnoses   Final diagnoses:  Dizziness     Discharge Instructions     Your vital signs, EKG, blood work and urine were all normal.  I am unsure why you are having dizziness.    You need to schedule an appointment with your neurologist as soon as possible for follow-up.    Go to the ED immediately if:   You have problems talking, walking, swallowing, or using your arms,  hands, or legs.  You are not thinking clearly or you have trouble forming sentences.  You have chest pain, abdominal pain, shortness of breath, or sweating.  Your vision changes.  You have any bleeding.  You have a severe headache.  You have neck pain or a stiff neck.  You have a fever.    ED Prescriptions    None     Controlled Substance Prescriptions [place] Controlled Substance Registry consulted? Not Applicable   Enrique Sack, [place] 09/12/18 1735

## 2018-09-13 ENCOUNTER — Ambulatory Visit: Payer: Self-pay | Admitting: Emergency Medicine

## 2018-09-15 ENCOUNTER — Encounter: Payer: Self-pay | Admitting: Neurology

## 2018-09-15 ENCOUNTER — Ambulatory Visit (INDEPENDENT_AMBULATORY_CARE_PROVIDER_SITE_OTHER): Payer: 59 | Admitting: Family Medicine

## 2018-09-15 ENCOUNTER — Encounter (INDEPENDENT_AMBULATORY_CARE_PROVIDER_SITE_OTHER): Payer: Self-pay | Admitting: Family Medicine

## 2018-09-15 VITALS — BP 128/76 | HR 91 | Temp 97.9°F | Ht 65.0 in | Wt 260.0 lb

## 2018-09-15 DIAGNOSIS — Z6841 Body Mass Index (BMI) 40.0 and over, adult: Secondary | ICD-10-CM

## 2018-09-15 DIAGNOSIS — E8881 Metabolic syndrome: Secondary | ICD-10-CM

## 2018-09-15 DIAGNOSIS — Z9189 Other specified personal risk factors, not elsewhere classified: Secondary | ICD-10-CM

## 2018-09-15 DIAGNOSIS — E559 Vitamin D deficiency, unspecified: Secondary | ICD-10-CM

## 2018-09-15 MED ORDER — DULAGLUTIDE 0.75 MG/0.5ML ~~LOC~~ SOAJ: SUBCUTANEOUS | 0 refills | Status: DC

## 2018-09-15 MED ORDER — METFORMIN HCL 1000 MG PO TABS: 1000.0000 mg | ORAL_TABLET | Freq: Every day | ORAL | 0 refills | Status: DC

## 2018-09-15 MED FILL — TRULICITY 0.75 MG/0.5 ML PE: 0.75 | 28 days supply | Qty: 2 | Fill #0

## 2018-09-15 MED FILL — metFORMIN HCL 1000 MG TABS: 1000 | 30 days supply | Qty: 30 | Fill #0

## 2018-09-16 DIAGNOSIS — G4733 Obstructive sleep apnea (adult) (pediatric): Secondary | ICD-10-CM | POA: Diagnosis not present

## 2018-09-16 MED FILL — PANTOPRAZOLE SOD DR 40 MG T: 40 | 90 days supply | Qty: 90 | Fill #0

## 2018-09-17 ENCOUNTER — Encounter: Payer: Self-pay | Admitting: Neurology

## 2018-09-18 NOTE — Progress Notes (Signed)
Office: [phone]  /  Fax: [phone]   HPI:   Chief Complaint: OBESITY Caroline Gonzales is here to discuss her progress with her obesity treatment plan. She is on the portion control better and make smarter food choices, such as increase vegetables and decrease simple carbohydrates and is following her eating plan approximately 20 % of the time. She states she is exercising 0 minutes 0 times per week. Caroline Gonzales has been feeling very overwhelmed with all of her doctor appointments, Neurologist, Psychologist, and wearing CPAP. She has started examing food issues with her therapist. She has a lot of emotional eating.  Her weight is 260 lb (117.9 kg) today and has gained 1 pound since her last visit. She has lost 18 lbs since starting treatment with [name].  Insulin Resistance Caroline Gonzales has a diagnosis of insulin resistance based on her elevated fasting insulin level >5. Although Rheana's blood glucose readings are still under good control, insulin resistance puts her at greater risk of metabolic syndrome and diabetes. She notes carbohydrate cravings and indulgent eating. She is taking metformin currently and continues to work on diet and exercise to decrease risk of diabetes.  At risk for diabetes Caroline Gonzales is at higher than average risk for developing diabetes due to her obesity and insulin resistance. She currently denies polyuria or polydipsia.  Vitamin D Deficiency Caroline Gonzales has a diagnosis of vitamin D deficiency. She is currently taking OTC Vit D. She notes fatigue and denies nausea, vomiting or muscle weakness.  ASSESSMENT AND PLAN:  Insulin resistance - Plan: Dulaglutide (TRULICITY) 1.06 YI/9.4WN SOPN, metFORMIN (GLUCOPHAGE) 1000 MG tablet  Vitamin D deficiency  At risk for diabetes mellitus  Class 3 severe obesity with serious comorbidity and body mass index (BMI) of 40.0 to 44.9 in adult, unspecified obesity type ([place])  PLAN:  Insulin Resistance Caroline Gonzales will continue to work on weight  loss, exercise, and decreasing simple carbohydrates in her diet to help decrease the risk of diabetes. We dicussed metformin including benefits and risks. She was informed that eating too many simple carbohydrates or too many calories at one sitting increases the likelihood of GI side effects. Kamora agrees to continue Trulicity 4.62 mg SubQ q week #2 pens and we will refill for 1 month, she agrees to continue metformin 1,000 mg PO q AM #30 and we will refill for 1 month. Kindall agrees to follow up with our clinic in 3 weeks as directed to monitor her progress.  Diabetes risk counselling Caroline Gonzales was given extended (15 minutes) diabetes prevention counseling today. She is 43 y.o. female and has risk factors for diabetes including obesity and insulin resistance. We discussed intensive lifestyle modifications today with an emphasis on weight loss as well as increasing exercise and decreasing simple carbohydrates in her diet.  Vitamin D Deficiency Caroline Gonzales was informed that low vitamin D levels contributes to fatigue and are associated with obesity, breast, and colon cancer. Caroline Gonzales agrees to continue taking OTC Vit D replacement and will follow up for routine testing of vitamin D, at least 2-3 times per year. She was informed of the risk of over-replacement of vitamin D and agrees to not increase her dose unless she discusses this with [name] first. Caroline Gonzales agrees to follow up with our clinic in 3 weeks.  Obesity Caroline Gonzales is currently in the action stage of change. As such, her goal is to continue with weight loss efforts She has agreed to portion control better and make smarter food choices, such as increase vegetables and decrease simple carbohydrates  Caroline Gonzales has been instructed to work up to a goal of 150 minutes of combined cardio and strengthening exercise per week for weight loss and overall health benefits. We discussed the following Behavioral Modification Strategies today: increasing lean protein  intake, increasing vegetables, work on meal planning and easy cooking plans and emotional eating strategies Caroline Gonzales plans to take medications 1/2 of the time at home. She plans to decrease indulgent breakfast at work to 1/2 of the time.  Caroline Gonzales has agreed to follow up with our clinic in 3 weeks. She was informed of the importance of frequent follow up visits to maximize her success with intensive lifestyle modifications for her multiple health conditions.  ALLERGIES: Allergies  Allergen Reactions  . Penicillins Shortness Of Breath and Rash    Has patient had a PCN reaction causing immediate rash, facial/tongue/throat swelling, SOB or lightheadedness with hypotension: Yes Has patient had a PCN reaction causing severe rash involving mucus membranes or skin necrosis: Yes Has patient had a PCN reaction that required hospitalization: No Has patient had a PCN reaction occurring within the last 10 years: No If all of the above answers are "NO", then may proceed with Cephalosporin use.   . Tamiflu [Oseltamivir Phosphate] Anaphylaxis  . Cephalosporins Rash  . Latex Rash    MEDICATIONS: Current Outpatient Medications on File Prior to Visit  Medication Sig Dispense Refill  . Cholecalciferol (VITAMIN D3) 5000 units CAPS Take 5,000 Units by mouth at bedtime.    . clonazePAM (KLONOPIN) 0.5 MG tablet Take 1 tablet (0.5 mg total) by mouth every 6 (six) hours as needed for anxiety. 120 tablet 2  . L-Methylfolate-Algae (DEPLIN 15) 15-90.314 MG CAPS Take by mouth daily.    [name] lamoTRIgine (LAMICTAL) 150 MG tablet One bid 180 tablet 0  . levonorgestrel (MIRENA) 20 MCG/24HR IUD 1 each by Intrauterine route once. Place 03/2013    . loratadine (CLARITIN) 10 MG tablet Take 10 mg by mouth at bedtime.     . Multiple Vitamin (MULTIVITAMIN) tablet Take 1 tablet by mouth at bedtime.     . pantoprazole (PROTONIX) 40 MG tablet Take 40 mg by mouth daily.    . prazosin (MINIPRESS) 2 MG capsule Take 2 capsules (4 mg  total) by mouth at bedtime. 180 capsule 0  . propranolol (INDERAL) 40 MG tablet TAKE 1 TABLET BY MOUTH TWICE DAILY 60 tablet 2  . QUEtiapine (SEROQUEL) 200 MG tablet Take 200 mg by mouth at bedtime.     No current facility-administered medications on file prior to visit.     PAST MEDICAL HISTORY: Past Medical History:  Diagnosis Date  . Allergy    Zyrtec, Allegra.  . Anxiety    followed by Dr. Toy Cookey  . Back pain   . Bipolar 1 disorder ([place])   . Complication of anesthesia   . Depression   . Fibroid   . Gallbladder problem   . Gastroparesis 09/14/2012   gastric emptying study in 2014  . GERD (gastroesophageal reflux disease)   . Pneumonia    2013ish  . PONV (postoperative nausea and vomiting)   . Pre-diabetes   . PTSD (post-traumatic stress disorder)   . Vitamin D deficiency     PAST SURGICAL HISTORY: Past Surgical History:  Procedure Laterality Date  . ANKLE ARTHROSCOPY Left 2011  . CESAREAN SECTION    . CHOLECYSTECTOMY    . DILATION AND CURETTAGE OF UTERUS    . FOREIGN BODY REMOVAL Left 11/18/2016   Procedure: REMOVAL FOREIGN BODY EXTREMITY LEFT  FOOT;  Surgeon: Trula Slade, DPM;  Location: [facility];  Service: Podiatry;  Laterality: Left;  . PILONIDAL CYST EXCISION  1990's  . TENDON REPAIR Left 2011   Left Ankle  . WISDOM TOOTH EXTRACTION  19090's    SOCIAL HISTORY: Social History   Tobacco Use  . Smoking status: Former Smoker    Years: 10.00    Types: Cigarettes    Last attempt to quit: 2003    Years since quitting: 17.0  . Smokeless tobacco: Never Used  Substance Use Topics  . Alcohol use: Yes    Alcohol/week: 0.0 standard drinks    Comment: 1-2 q 28months maybe  . Drug use: No    FAMILY HISTORY: Family History  Problem Relation Age of Onset  . Cancer Mother        squamous cell carcinoma  . Hyperlipidemia Mother   . Depression Mother   . Anxiety disorder Mother   . Obesity Mother   . Heart disease Father 42       cardiomegaly, CHF; steroid  use.  [name] Hyperlipidemia Father   . Hypertension Father   . Mental retardation Father   . High blood pressure Father   . Depression Father   . Anxiety disorder Father   . Obesity Father   . Cancer Maternal Grandmother   . Diabetes Maternal Grandmother   . Heart disease Maternal Grandmother   . Hyperlipidemia Maternal Grandmother   . Hypertension Maternal Grandmother   . Stroke Maternal Grandmother   . Heart disease Paternal Grandmother   . Hypertension Paternal Grandmother   . Heart disease Paternal Grandfather   . Hyperlipidemia Paternal Grandfather   . Mental illness Paternal Grandfather   . Rheum arthritis Sister   . Breast cancer Maternal Aunt   . Thyroid cancer Paternal Aunt     ROS: Review of Systems  Constitutional: Positive for malaise/fatigue. Negative for weight loss.  Gastrointestinal: Negative for nausea and vomiting.  Genitourinary: Negative for frequency.  Musculoskeletal:       Negative muscle weakness  Endo/Heme/Allergies: Negative for polydipsia.    PHYSICAL EXAM: Blood pressure 128/76, pulse 91, temperature 97.9 F (36.6 C), temperature source Oral, height 5\' 5"  (1.651 m), weight 260 lb (117.9 kg), SpO2 95 %. Body mass index is 43.27 kg/m. Physical Exam Vitals signs reviewed.  Constitutional:      Appearance: Normal appearance. She is obese.  Cardiovascular:     Rate and Rhythm: Normal rate.     Pulses: Normal pulses.  Pulmonary:     Effort: Pulmonary effort is normal.  Musculoskeletal: Normal range of motion.  Skin:    General: Skin is warm and dry.  Neurological:     Mental Status: She is alert and oriented to person, place, and time.  Psychiatric:        Mood and Affect: Mood normal.        Behavior: Behavior normal.     RECENT LABS AND TESTS: BMET    Component Value Date/Time   NA 137 09/12/2018 1636   NA 135 05/25/2018 0831   K 4.0 09/12/2018 1636   CL 102 09/12/2018 1636   CO2 20 05/25/2018 0831   GLUCOSE 87 09/12/2018 1636    BUN 11 09/12/2018 1636   BUN 12 05/25/2018 0831   CREATININE 0.90 09/12/2018 1636   CREATININE 0.95 06/27/2016 0933   CALCIUM 9.1 05/25/2018 0831   GFRNONAA 65 05/25/2018 0831   GFRAA 75 05/25/2018 0831   Lab Results  Component Value  Date   HGBA1C 5.3 05/25/2018   HGBA1C 5.4 02/10/2018   HGBA1C 5.5 10/07/2017   HGBA1C 5.1 06/22/2017   HGBA1C 5.3 01/11/2017   Lab Results  Component Value Date   INSULIN 87.4 (H) 05/25/2018   INSULIN 20.3 02/10/2018   INSULIN 15.6 10/07/2017   INSULIN 25.2 (H) 06/22/2017   INSULIN 17.0 01/11/2017   CBC    Component Value Date/Time   WBC 5.5 01/05/2018 0915   RBC 4.02 01/05/2018 0915   HGB 13.3 09/12/2018 1636   HGB 11.2 06/22/2017 0904   HCT 39.0 09/12/2018 1636   HCT 34.8 06/22/2017 0904   PLT 285 01/05/2018 0915   PLT 332 11/12/2016 1651   MCV 88.1 01/05/2018 0915   MCV 86 06/22/2017 0904   MCH 27.6 01/05/2018 0915   MCHC 31.4 01/05/2018 0915   RDW 14.4 01/05/2018 0915   RDW 15.7 (H) 06/22/2017 0904   LYMPHSABS 1.5 06/22/2017 0904   MONOABS 891 04/30/2016 0947   EOSABS 0.2 06/22/2017 0904   BASOSABS 0.0 06/22/2017 0904   Iron/TIBC/Ferritin/ %Sat    Component Value Date/Time   IRON 53 08/31/2016 1644   TIBC 280 08/31/2016 1644   FERRITIN 36 08/31/2016 1644   IRONPCTSAT 19 08/31/2016 1644   Lipid Panel     Component Value Date/Time   CHOL 137 05/25/2018 0831   TRIG 194 (H) 05/25/2018 0831   HDL 36 (L) 05/25/2018 0831   CHOLHDL 3.5 02/10/2018 0749   CHOLHDL 4.1 12/01/2015 0914   VLDL 37 (H) 12/01/2015 0914   LDLCALC 62 05/25/2018 0831   Hepatic Function Panel     Component Value Date/Time   PROT 7.1 05/25/2018 0831   ALBUMIN 3.9 05/25/2018 0831   AST 16 05/25/2018 0831   ALT 14 05/25/2018 0831   ALKPHOS 93 05/25/2018 0831   BILITOT <0.2 05/25/2018 0831      Component Value Date/Time   TSH 1.620 08/31/2016 1644   TSH 1.83 12/01/2015 0914      OBESITY BEHAVIORAL INTERVENTION VISIT  Today's visit was # 40     Starting weight: 278 lbs Starting date: 08/31/16 Today's weight : 260 lbs Today's date: 09/15/2018 Total lbs lost to date: 18    ASK: We discussed the diagnosis of obesity with Marjie Skiff Bigley today and Caroline Gonzales agreed to give [name] permission to discuss obesity behavioral modification therapy today.  ASSESS: Caroline Gonzales has the diagnosis of obesity and her BMI today is 43.27 Caroline Gonzales is in the action stage of change   ADVISE: Yacine was educated on the multiple health risks of obesity as well as the benefit of weight loss to improve her health. She was advised of the need for long term treatment and the importance of lifestyle modifications to improve her current health and to decrease her risk of future health problems.  AGREE: Multiple dietary modification options and treatment options were discussed and  Caroline Gonzales agreed to follow the recommendations documented in the above note.  ARRANGE: Caroline Gonzales was educated on the importance of frequent visits to treat obesity as outlined per CMS and USPSTF guidelines and agreed to schedule her next follow up appointment today.  I, Trixie Dredge, am acting as transcriptionist for Ilene Qua, MD  I have reviewed the above documentation for accuracy and completeness, and I agree with the above. - Ilene Qua, MD

## 2018-09-19 DIAGNOSIS — F4310 Post-traumatic stress disorder, unspecified: Secondary | ICD-10-CM | POA: Diagnosis not present

## 2018-09-21 ENCOUNTER — Encounter: Payer: Self-pay | Admitting: Family Medicine

## 2018-09-21 ENCOUNTER — Other Ambulatory Visit: Payer: Self-pay | Admitting: Physician Assistant

## 2018-09-21 ENCOUNTER — Ambulatory Visit: Payer: 59 | Admitting: Family Medicine

## 2018-09-21 VITALS — BP 122/78 | HR 86 | Ht 65.0 in | Wt 265.6 lb

## 2018-09-21 DIAGNOSIS — Z7689 Persons encountering health services in other specified circumstances: Secondary | ICD-10-CM

## 2018-09-21 DIAGNOSIS — H81399 Other peripheral vertigo, unspecified ear: Secondary | ICD-10-CM

## 2018-09-21 MED FILL — lamoTRIgine 150 MG TABS: 150 | 90 days supply | Qty: 180 | Fill #0

## 2018-09-21 MED FILL — PRAZOSIN 2 MG CAPSULE: 2 | 90 days supply | Qty: 180 | Fill #0

## 2018-09-21 NOTE — Progress Notes (Signed)
Caroline Gonzales is a 43 y.o. female  Chief Complaint  Patient presents with  . Dizziness    daily dizzy spells , 09/12/18 pt fell while on toliet     HPI: Caroline Gonzales is a 43 y.o. female here to establish care with our office.  Pt was recently seen in UC on 09/12/18 for dizziness x a few weeks but worse that day. Work-up was negative/normal and she was told to f/u with PCP and neurology. Orthostatics negative, neg Epley maneuver. Pt sees neuro Dr. Rexene Alberts for OSA on CPAP. She needs a referral to be seen for dizziness.  Pt states she has been having lightheaded episodes x 2 mo. Pt states she always "leans to the right" when this happens. She denies vertigo. Symptoms are not always associated with position change. Some mild ringing in her ears but this is not new (6 mo duration in Rt ear). No vision changes, headache, weakness. She notes worsening fatigue (she recently started using CPAP and not tolerating this). No speech changes. No ear pain. No hearing loss. No SOB, CP.  She has no new meds and no dose changes. Pt bought home BP cuff 2 mo ago when symptoms started and Bps have been in 120/80's. She has had cardiac enzymes, EKG.  Last A1C = 5.3 Pt had labyrinthitis 15 years ago and states current symptoms are not at all the same. Pt does have allergies but states symptoms are not worse than normal. No nasal congestion, runny nose, sinus pressure.  Pt is very, very anxious about symptoms. 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   Specialists:Dr. Adair Patter (Weight Management), Dr. Rexene Alberts (neurology for OSA), GYN  Last CPE, labs: due   Last PAP: UTD - follows with GYN Last mammo: fall 2019 Last Dexa: n/a Last colonoscopy: n/a  Med refills needed today: none   Past Medical History:  Diagnosis Date  . Allergy    Zyrtec, Allegra.  . Anxiety    followed by Dr. Toy Cookey  . Back pain   . Bipolar 1 disorder ([place])   .  Complication of anesthesia   . Depression   . Fibroid   . Gallbladder problem   . Gastroparesis 09/14/2012   gastric emptying study in 2014  . GERD (gastroesophageal reflux disease)   . Pneumonia    2013ish  . PONV (postoperative nausea and vomiting)   . Pre-diabetes   . PTSD (post-traumatic stress disorder)   . Vitamin D deficiency     Past Surgical History:  Procedure Laterality Date  . ANKLE ARTHROSCOPY Left 2011  . CESAREAN SECTION    . CHOLECYSTECTOMY    . DILATION AND CURETTAGE OF UTERUS    . FOREIGN BODY REMOVAL Left 11/18/2016   Procedure: REMOVAL FOREIGN BODY EXTREMITY LEFT FOOT;  Surgeon: Trula Slade, DPM;  Location: [facility];  Service: Podiatry;  Laterality: Left;  . PILONIDAL CYST EXCISION  1990's  . TENDON REPAIR Left 2011   Left Ankle  . WISDOM TOOTH EXTRACTION  19090's    Social History   Socioeconomic History  . Marital status: Married    Spouse name: Not on file  . Number of children: Not on file  . Years of education: Not on file  . Highest education level: Not on file  Occupational History  . Occupation: Programmer, multimedia: [employer]  . Financial resource strain: Not on file  .  Food insecurity:    Worry: Not on file    Inability: Not on file  . Transportation needs:    Medical: Not on file    Non-medical: Not on file  Tobacco Use  . Smoking status: Former Smoker    Years: 10.00    Types: Cigarettes    Last attempt to quit: 2003    Years since quitting: 17.0  . Smokeless tobacco: Never Used  Substance and Sexual Activity  . Alcohol use: Yes    Alcohol/week: 0.0 standard drinks    Comment: 1-2 q 53months maybe  . Drug use: No  . Sexual activity: Yes    Partners: Male    Birth control/protection: I.U.D.    Comment: Mirena  Lifestyle  . Physical activity:    Days per week: Not on file    Minutes per session: Not on file  . Stress: Not on file  Relationships  . Social connections:    Talks on phone: Not on file    Gets  together: Not on file    Attends religious service: Not on file    Active member of club or organization: Not on file    Attends meetings of clubs or organizations: Not on file    Relationship status: Not on file  . Intimate partner violence:    Fear of current or ex partner: Not on file    Emotionally abused: Not on file    Physically abused: Not on file    Forced sexual activity: Not on file  Other Topics Concern  . 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      Seatbelt: 100%; no texting while driving often        Family History  Problem Relation Age of Onset  . Cancer Mother        squamous cell carcinoma  . Hyperlipidemia Mother   . Depression Mother   . Anxiety disorder Mother   . Obesity Mother   . Heart disease Father 30       cardiomegaly, CHF; steroid use.  [name] Hyperlipidemia Father   . Hypertension Father   . Mental retardation Father   . High blood pressure Father   . Depression Father   . Anxiety disorder Father   . Obesity Father   . Cancer Maternal Grandmother   . Diabetes Maternal Grandmother   . Heart disease Maternal Grandmother   . Hyperlipidemia Maternal Grandmother   . Hypertension Maternal Grandmother   . Stroke Maternal Grandmother   . Heart disease Paternal Grandmother   . Hypertension Paternal Grandmother   . Heart disease Paternal Grandfather   . Hyperlipidemia Paternal Grandfather   . Mental illness Paternal Grandfather   . Rheum arthritis Sister   . Breast cancer Maternal Aunt   . Thyroid cancer Paternal Aunt      Immunization History  Administered Date(s) Administered  . Influenza Split 06/15/2015, 06/08/2016  .  Influenza-Unspecified 06/07/2017  . PPD Test 06/24/2018  . Tdap 09/15/2015    Outpatient Encounter Medications as of  09/21/2018  Medication Sig  . Cholecalciferol (VITAMIN D3) 5000 units CAPS Take 5,000 Units by mouth at bedtime.  . clonazePAM (KLONOPIN) 0.5 MG tablet Take 1 tablet (0.5 mg total) by mouth every 6 (six) hours as needed for anxiety.  . Dulaglutide (TRULICITY) 6.27 OJ/5.0KX SOPN INJECT 0.75 MG INTO THE SKIN EVERY 7 DAYS.  [name] L-Methylfolate-Algae (DEPLIN 15) 15-90.314 MG CAPS Take by mouth daily.  [name] lamoTRIgine (LAMICTAL) 150 MG tablet TAKE 1 TABLET BY MOUTH TWICE DAILY  . levonorgestrel (MIRENA) 20 MCG/24HR IUD 1 each by Intrauterine route once. Place 03/2013  . loratadine (CLARITIN) 10 MG tablet Take 10 mg by mouth at bedtime.   . metFORMIN (GLUCOPHAGE) 1000 MG tablet Take 1 tablet (1,000 mg total) by mouth daily with breakfast.  . Multiple Vitamin (MULTIVITAMIN) tablet Take 1 tablet by mouth at bedtime.   . pantoprazole (PROTONIX) 40 MG tablet Take 40 mg by mouth daily.  . prazosin (MINIPRESS) 2 MG capsule TAKE 2 CAPSULES BY MOUTH AT BEDTIME.  [name] propranolol (INDERAL) 40 MG tablet TAKE 1 TABLET BY MOUTH TWICE DAILY  . QUEtiapine (SEROQUEL) 200 MG tablet Take 200 mg by mouth at bedtime.   No facility-administered encounter medications on file as of 09/21/2018.      ROS: Pertinent positives and negatives noted in HPI. Remainder of ROS non-contributory    Allergies  Allergen Reactions  . Penicillins Shortness Of Breath and Rash    Has patient had a PCN reaction causing immediate rash, facial/tongue/throat swelling, SOB or lightheadedness with hypotension: Yes Has patient had a PCN reaction causing severe rash involving mucus membranes or skin necrosis: Yes Has patient had a PCN reaction that required hospitalization: No Has patient had a PCN reaction occurring within the last 10 years: No If all of the above answers are "NO", then may proceed with Cephalosporin use.   .  Tamiflu [Oseltamivir Phosphate] Anaphylaxis  . Cephalosporins Rash  . Latex Rash    BP 140/80   Pulse 86   Ht 5\' 5"  (1.651 m)   Wt 265 lb 9.6 oz (120.5 kg)   SpO2 95%   BMI 44.20 kg/m   BP Readings from Last 3 Encounters:  09/21/18 140/80  09/15/18 128/76  09/12/18 (!) 152/91     Physical Exam  Constitutional: She is oriented to person, place, and time. She appears well-developed and well-nourished. No distress.  HENT:  Head: Normocephalic and atraumatic.  Right Ear: Hearing, tympanic membrane and ear canal normal. No middle ear effusion.  Left Ear: Hearing, tympanic membrane and ear canal normal.  No middle ear effusion.  Nose: Nose normal. No mucosal edema or rhinorrhea. Right sinus exhibits no maxillary sinus tenderness and no frontal sinus tenderness. Left sinus exhibits no maxillary sinus tenderness and no frontal sinus tenderness.  Mouth/Throat: Oropharynx is clear and moist and mucous membranes are normal.  Eyes: Pupils are equal, round, and reactive to light. Conjunctivae and EOM are normal.  Musculoskeletal:        General: No edema.  Neurological: She is alert and oriented to person, place, and time. She has normal reflexes. No cranial nerve deficit. Coordination normal.  Skin: Skin is warm and dry.  Psychiatric: She has a normal mood and affect.     A/P:  1. Other peripheral vertigo, unspecified ear - symptoms x 2+ mo, worsening - normal labs, vitals, EKG - no new meds or med changes - may be d/t poor sleep as pt has OSA and started on CPAP a few  months ago but pt is not tolerating the mask (having panic attacks at night while wearing) so sleep is worse than baseline. Pt has f/u scheduled with neuro for this - CT Head Wo Contrast; Future - if negative, would defer to neuro or could consider ENT eval  Discussed plan and reviewed medications with patient, including risks, benefits, and potential side effects. Pt expressed understand. All questions answered.  2.  Encounter to establish care with new doctor - pt will schedule CPE appt in late spring/early summer - PAP, mammo UTD  I spent 35 min with the patient today and greater than 50% was spent in counseling, coordination of care, education

## 2018-09-23 ENCOUNTER — Encounter: Payer: Self-pay | Admitting: Family Medicine

## 2018-09-23 MED FILL — clonazePAM 0.5 MG TABS: 0.5 | 30 days supply | Qty: 120 | Fill #1

## 2018-09-27 ENCOUNTER — Ambulatory Visit (HOSPITAL_COMMUNITY)
Admission: RE | Admit: 2018-09-27 | Discharge: 2018-09-27 | Disposition: A | Discharge status: Home / Self Care | Payer: 59 | Source: Ambulatory Visit | Attending: Family Medicine | Admitting: Family Medicine

## 2018-09-27 DIAGNOSIS — R42 Dizziness and giddiness: Secondary | ICD-10-CM | POA: Diagnosis not present

## 2018-09-27 DIAGNOSIS — H81399 Other peripheral vertigo, unspecified ear: Secondary | ICD-10-CM | POA: Insufficient documentation

## 2018-09-29 ENCOUNTER — Ambulatory Visit: Payer: Self-pay | Admitting: Neurology

## 2018-09-29 DIAGNOSIS — G4733 Obstructive sleep apnea (adult) (pediatric): Secondary | ICD-10-CM | POA: Diagnosis not present

## 2018-10-06 DIAGNOSIS — H2189 Other specified disorders of iris and ciliary body: Secondary | ICD-10-CM | POA: Diagnosis not present

## 2018-10-06 DIAGNOSIS — H9319 Tinnitus, unspecified ear: Secondary | ICD-10-CM | POA: Diagnosis not present

## 2018-10-06 DIAGNOSIS — H5509 Other forms of nystagmus: Secondary | ICD-10-CM | POA: Diagnosis not present

## 2018-10-07 ENCOUNTER — Encounter: Payer: Self-pay | Admitting: Family Medicine

## 2018-10-07 ENCOUNTER — Other Ambulatory Visit: Payer: Self-pay | Admitting: Physician Assistant

## 2018-10-07 DIAGNOSIS — F4310 Post-traumatic stress disorder, unspecified: Secondary | ICD-10-CM | POA: Diagnosis not present

## 2018-10-07 MED ORDER — DIAZEPAM 5 MG PO TABS: ORAL_TABLET | ORAL | 0 refills | Status: DC

## 2018-10-07 NOTE — Telephone Encounter (Signed)
Pt called requesting something to help her relax during upcoming MRI by eye dr.  Have extreme claustrophobia.  Eye dr hesitates to prescribe due to other meds .

## 2018-10-07 NOTE — Telephone Encounter (Signed)
No action needed, Traci  I Sent in Valium to [facility] outpatient pharmacy.  Notified pt.  I told her to ask the radiology dept, when she gets the MRI set up, what the radiologist usually recommends.  If different than Valium, either they can Rx or I will.  [name]

## 2018-10-07 NOTE — Telephone Encounter (Signed)
I need her specific pharmacy.  I'm going to send in Valium, which works quicker than the Klonopin.  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

## 2018-10-10 ENCOUNTER — Ambulatory Visit (INDEPENDENT_AMBULATORY_CARE_PROVIDER_SITE_OTHER): Payer: 59 | Admitting: Family Medicine

## 2018-10-10 ENCOUNTER — Encounter (INDEPENDENT_AMBULATORY_CARE_PROVIDER_SITE_OTHER): Payer: Self-pay | Admitting: Family Medicine

## 2018-10-10 ENCOUNTER — Telehealth: Payer: Self-pay | Admitting: Family Medicine

## 2018-10-10 VITALS — BP 116/76 | HR 90 | Temp 97.4°F | Ht 65.0 in | Wt 264.0 lb

## 2018-10-10 DIAGNOSIS — E88819 Insulin resistance, unspecified: Secondary | ICD-10-CM

## 2018-10-10 DIAGNOSIS — R42 Dizziness and giddiness: Secondary | ICD-10-CM

## 2018-10-10 DIAGNOSIS — Z6841 Body Mass Index (BMI) 40.0 and over, adult: Secondary | ICD-10-CM

## 2018-10-10 DIAGNOSIS — Z9189 Other specified personal risk factors, not elsewhere classified: Secondary | ICD-10-CM

## 2018-10-10 DIAGNOSIS — H81399 Other peripheral vertigo, unspecified ear: Secondary | ICD-10-CM

## 2018-10-10 DIAGNOSIS — E8881 Metabolic syndrome: Secondary | ICD-10-CM | POA: Diagnosis not present

## 2018-10-10 DIAGNOSIS — E66813 Obesity, class 3: Secondary | ICD-10-CM

## 2018-10-10 DIAGNOSIS — E7849 Other hyperlipidemia: Secondary | ICD-10-CM | POA: Diagnosis not present

## 2018-10-10 DIAGNOSIS — H5509 Other forms of nystagmus: Secondary | ICD-10-CM

## 2018-10-10 DIAGNOSIS — E559 Vitamin D deficiency, unspecified: Secondary | ICD-10-CM

## 2018-10-10 MED ORDER — METFORMIN HCL 1000 MG PO TABS: 1000.0000 mg | ORAL_TABLET | Freq: Every day | ORAL | 0 refills | Status: DC

## 2018-10-10 MED FILL — PROPRANOLOL 40 MG TABLET: 40 | 30 days supply | Qty: 60 | Fill #1

## 2018-10-10 MED FILL — metFORMIN HCL 1000 MG TABS: 1000 | 30 days supply | Qty: 30 | Fill #0

## 2018-10-10 MED FILL — diazePAM 5 MG TABS: 5 | 1 days supply | Qty: 2 | Fill #0

## 2018-10-10 NOTE — Telephone Encounter (Signed)
Dr. Loletha Grayer. Please advise, Spoke with the pt. No appointment has been made yet. Pt states that she is real claustrophobic and would like a sedated MRI. Needs a new referral. See telephone encounter below.

## 2018-10-10 NOTE — Progress Notes (Signed)
Office: [phone]  /  Fax: [phone]   HPI:   Chief Complaint: OBESITY Kassity is here to discuss her progress with her obesity treatment plan. She is on the portion control better and make smarter food choices, such as increase vegetables and decrease simple carbohydrates and is following her eating plan approximately 30 % of the time. She states she is exercising 0 minutes 0 times per week. Safari went for a CT Head for worsening symptoms of tinnitus, dizziness, and vision changes. She needs to get an MRI. She has been doing lost o stress eating and has started drinking sugary drinks.  Her weight is 264 lb (119.7 kg) today and has had a weight gain of 4 pounds over a period of 3 weeks since her last visit. She has lost 14 lbs since starting treatment with [name].  Insulin Resistance Klarisa has a diagnosis of insulin resistance based on her elevated fasting insulin level >5. Although Tayte's blood glucose readings are still under good control, insulin resistance puts her at greater risk of metabolic syndrome and diabetes. She is taking Trulicity and Glucophage and denies and GI side effects. She is still having carb cravings and continues to work on diet and exercise to decrease risk of diabetes.  At risk for diabetes Daksha is at higher than average risk for developing diabetes due to her pre-diabetes and obesity. She currently denies polyuria or polydipsia.  Vitamin D deficiency Myldred has a diagnosis of vitamin D deficiency. She is currently taking OTC vit D 5,000 IU per day and denies nausea, vomiting, or muscle weakness.  Hyperlipidemia Chanya has hyperlipidemia and has been trying to improve her cholesterol levels with intensive lifestyle modification including a low saturated fat diet, exercise and weight loss. She is not on a statin and her last LDL was within normal limits, while her triglycerides were elevated.  ASSESSMENT AND PLAN:  Insulin resistance - Plan: Vitamin  B12, Hemoglobin A1c, Insulin, random, Comprehensive metabolic panel, metFORMIN (GLUCOPHAGE) 1000 MG tablet  Vitamin D deficiency - Plan: Vitamin B12, VITAMIN D 25 Hydroxy (Vit-D Deficiency, Fractures)  Other hyperlipidemia  At risk for diabetes mellitus  Class 3 severe obesity with serious comorbidity and body mass index (BMI) of 40.0 to 44.9 in adult, unspecified obesity type [facility])  PLAN:  Pre-Diabetes Arwyn will continue to work on weight loss, exercise, and decreasing simple carbohydrates in her diet to help decrease the risk of diabetes.  She was informed that eating too many simple carbohydrates or too many calories at one sitting increases the likelihood of GI side effects. Lavaya agreed to stop Trulicity and she will continue taking metformin 1000mg  PO qAM #30 with no refills and a prescription was written today. We ordered a CMP, a Hgb A1c, an Insulin, and a B12 today. Britne agreed to follow up with [name] as directed to monitor her progress in 2 weeks.  Diabetes risk counseling Ginnifer was given extended (15 minutes) diabetes prevention counseling today. She is 43 y.o. female and has risk factors for diabetes including pre-diabetes and obesity. We discussed intensive lifestyle modifications today with an emphasis on weight loss as well as increasing exercise and decreasing simple carbohydrates in her diet.  Vitamin D Deficiency [name] was informed that low vitamin D levels contributes to fatigue and are associated with obesity, breast, and colon cancer. She agrees to continue to take OTC Vit D @5 ,000 IU every day and will follow up for routine testing of vitamin D, at least 2-3 times per year.  She was informed of the risk of over-replacement of vitamin D and agrees to not increase her dose unless she discusses this with [name] first. A vitamin D level was ordered today and she agrees to follow up as directed.  Hyperlipidemia Aarian was informed of the American Heart Association  Guidelines emphasizing intensive lifestyle modifications as the first line treatment for hyperlipidemia. We discussed many lifestyle modifications today in depth, and Donique will continue to work on decreasing saturated fats such as fatty red meat, butter and many fried foods. She will also increase vegetables and lean protein in her diet and continue to work on exercise and weight loss efforts. A FLP was order today and she will follow up at the agreed upon time.  Obesity Lillyann is currently in the action stage of change. As such, her goal is to continue with weight loss efforts. She has agreed to portion control better and make smarter food choices, such as increase vegetables and decrease simple carbohydrates.  Avana has been instructed to work up to a goal of 150 minutes of combined cardio and strengthening exercise per week for weight loss and overall health benefits. We discussed the following Behavioral Modification Strategies today: increasing vegetables, work on meal planning and easy cooking plans, emotional eating strategies, avoiding temptations, and planning for success.  Dasani has agreed to follow up with our clinic in 2 weeks. She was informed of the importance of frequent follow up visits to maximize her success with intensive lifestyle modifications for her multiple health conditions.  ALLERGIES: Allergies  Allergen Reactions  . Penicillins Shortness Of Breath and Rash    Has patient had a PCN reaction causing immediate rash, facial/tongue/throat swelling, SOB or lightheadedness with hypotension: Yes Has patient had a PCN reaction causing severe rash involving mucus membranes or skin necrosis: Yes Has patient had a PCN reaction that required hospitalization: No Has patient had a PCN reaction occurring within the last 10 years: No If all of the above answers are "NO", then may proceed with Cephalosporin use.   . Tamiflu [Oseltamivir Phosphate] Anaphylaxis  . Cephalosporins  Rash  . Latex Rash    MEDICATIONS: Current Outpatient Medications on File Prior to Visit  Medication Sig Dispense Refill  . Cholecalciferol (VITAMIN D3) 5000 units CAPS Take 5,000 Units by mouth at bedtime.    . clonazePAM (KLONOPIN) 0.5 MG tablet Take 1 tablet (0.5 mg total) by mouth every 6 (six) hours as needed for anxiety. 120 tablet 2  . diazepam (VALIUM) 5 MG tablet 1-2 po about 1 30-60 minutes pre-MRI. 2 tablet 0  . L-Methylfolate-Algae (DEPLIN 15) 15-90.314 MG CAPS Take by mouth daily.    [name] lamoTRIgine (LAMICTAL) 150 MG tablet TAKE 1 TABLET BY MOUTH TWICE DAILY 180 tablet 0  . levonorgestrel (MIRENA) 20 MCG/24HR IUD 1 each by Intrauterine route once. Place 03/2013    . loratadine (CLARITIN) 10 MG tablet Take 10 mg by mouth at bedtime.     . Multiple Vitamin (MULTIVITAMIN) tablet Take 1 tablet by mouth at bedtime.     . pantoprazole (PROTONIX) 40 MG tablet Take 40 mg by mouth daily.    . prazosin (MINIPRESS) 2 MG capsule TAKE 2 CAPSULES BY MOUTH AT BEDTIME. 180 capsule 0  . propranolol (INDERAL) 40 MG tablet TAKE 1 TABLET BY MOUTH TWICE DAILY 60 tablet 2  . QUEtiapine (SEROQUEL) 200 MG tablet Take 200 mg by mouth at bedtime.     No current facility-administered medications on file prior to visit.  PAST MEDICAL HISTORY: Past Medical History:  Diagnosis Date  . Allergy    Zyrtec, Allegra.  . Anxiety    followed by Dr. Toy Cookey  . Back pain   . Bipolar 1 disorder ([place])   . Complication of anesthesia   . Depression   . Fibroid   . Gallbladder problem   . Gastroparesis 09/14/2012   gastric emptying study in 2014  . GERD (gastroesophageal reflux disease)   . Pneumonia    2013ish  . PONV (postoperative nausea and vomiting)   . Pre-diabetes   . PTSD (post-traumatic stress disorder)   . Vitamin D deficiency     PAST SURGICAL HISTORY: Past Surgical History:  Procedure Laterality Date  . ANKLE ARTHROSCOPY Left 2011  . CESAREAN SECTION    . CHOLECYSTECTOMY    . DILATION  AND CURETTAGE OF UTERUS    . FOREIGN BODY REMOVAL Left 11/18/2016   Procedure: REMOVAL FOREIGN BODY EXTREMITY LEFT FOOT;  Surgeon: Trula Slade, DPM;  Location: [place];  Service: Podiatry;  Laterality: Left;  . PILONIDAL CYST EXCISION  1990's  . TENDON REPAIR Left 2011   Left Ankle  . WISDOM TOOTH EXTRACTION  19090's    SOCIAL HISTORY: Social History   Tobacco Use  . Smoking status: Former Smoker    Years: 10.00    Types: Cigarettes    Last attempt to quit: 2003    Years since quitting: 17.0  . Smokeless tobacco: Never Used  Substance Use Topics  . Alcohol use: Yes    Alcohol/week: 0.0 standard drinks    Comment: 1-2 q 64months maybe  . Drug use: No    FAMILY HISTORY: Family History  Problem Relation Age of Onset  . Cancer Mother        squamous cell carcinoma  . Hyperlipidemia Mother   . Depression Mother   . Anxiety disorder Mother   . Obesity Mother   . Heart disease Father 37       cardiomegaly, CHF; steroid use.  [name] Hyperlipidemia Father   . Hypertension Father   . Mental retardation Father   . High blood pressure Father   . Depression Father   . Anxiety disorder Father   . Obesity Father   . Cancer Maternal Grandmother   . Diabetes Maternal Grandmother   . Heart disease Maternal Grandmother   . Hyperlipidemia Maternal Grandmother   . Hypertension Maternal Grandmother   . Stroke Maternal Grandmother   . Heart disease Paternal Grandmother   . Hypertension Paternal Grandmother   . Heart disease Paternal Grandfather   . Hyperlipidemia Paternal Grandfather   . Mental illness Paternal Grandfather   . Rheum arthritis Sister   . Breast cancer Maternal Aunt   . Thyroid cancer Paternal Aunt    ROS: Review of Systems  Constitutional: Negative for weight loss.  HENT: Positive for tinnitus.   Eyes:       Positive for vision changes.  Gastrointestinal: Negative for diarrhea, nausea and vomiting.  Genitourinary:       Negative for polyuria.  Musculoskeletal:        Negative for muscle weakness.  Neurological: Positive for dizziness.  Endo/Heme/Allergies: Negative for polydipsia.   PHYSICAL EXAM: Blood pressure 116/76, pulse 90, temperature (!) 97.4 F (36.3 C), temperature source Oral, height 5\' 5"  (1.651 m), weight 264 lb (119.7 kg), SpO2 100 %. Body mass index is 43.93 kg/m. Physical Exam Vitals signs reviewed.  Constitutional:      Appearance: Normal appearance. She is  obese.  Cardiovascular:     Rate and Rhythm: Normal rate.  Pulmonary:     Effort: Pulmonary effort is normal.  Musculoskeletal: Normal range of motion.  Skin:    General: Skin is warm and dry.  Neurological:     Mental Status: She is alert and oriented to person, place, and time.  Psychiatric:        Mood and Affect: Mood normal.        Behavior: Behavior normal.    RECENT LABS AND TESTS: BMET    Component Value Date/Time   NA 137 09/12/2018 1636   NA 135 05/25/2018 0831   K 4.0 09/12/2018 1636   CL 102 09/12/2018 1636   CO2 20 05/25/2018 0831   GLUCOSE 87 09/12/2018 1636   BUN 11 09/12/2018 1636   BUN 12 05/25/2018 0831   CREATININE 0.90 09/12/2018 1636   CREATININE 0.95 06/27/2016 0933   CALCIUM 9.1 05/25/2018 0831   GFRNONAA 65 05/25/2018 0831   GFRAA 75 05/25/2018 0831   Lab Results  Component Value Date   HGBA1C 5.3 05/25/2018   HGBA1C 5.4 02/10/2018   HGBA1C 5.5 10/07/2017   HGBA1C 5.1 06/22/2017   HGBA1C 5.3 01/11/2017   Lab Results  Component Value Date   INSULIN 87.4 (H) 05/25/2018   INSULIN 20.3 02/10/2018   INSULIN 15.6 10/07/2017   INSULIN 25.2 (H) 06/22/2017   INSULIN 17.0 01/11/2017   CBC    Component Value Date/Time   WBC 5.5 01/05/2018 0915   RBC 4.02 01/05/2018 0915   HGB 13.3 09/12/2018 1636   HGB 11.2 06/22/2017 0904   HCT 39.0 09/12/2018 1636   HCT 34.8 06/22/2017 0904   PLT 285 01/05/2018 0915   PLT 332 11/12/2016 1651   MCV 88.1 01/05/2018 0915   MCV 86 06/22/2017 0904   MCH 27.6 01/05/2018 0915   MCHC 31.4  01/05/2018 0915   RDW 14.4 01/05/2018 0915   RDW 15.7 (H) 06/22/2017 0904   LYMPHSABS 1.5 06/22/2017 0904   MONOABS 891 04/30/2016 0947   EOSABS 0.2 06/22/2017 0904   BASOSABS 0.0 06/22/2017 0904   Iron/TIBC/Ferritin/ %Sat    Component Value Date/Time   IRON 53 08/31/2016 1644   TIBC 280 08/31/2016 1644   FERRITIN 36 08/31/2016 1644   IRONPCTSAT 19 08/31/2016 1644   Lipid Panel     Component Value Date/Time   CHOL 137 05/25/2018 0831   TRIG 194 (H) 05/25/2018 0831   HDL 36 (L) 05/25/2018 0831   CHOLHDL 3.5 02/10/2018 0749   CHOLHDL 4.1 12/01/2015 0914   VLDL 37 (H) 12/01/2015 0914   LDLCALC 62 05/25/2018 0831   Hepatic Function Panel     Component Value Date/Time   PROT 7.1 05/25/2018 0831   ALBUMIN 3.9 05/25/2018 0831   AST 16 05/25/2018 0831   ALT 14 05/25/2018 0831   ALKPHOS 93 05/25/2018 0831   BILITOT <0.2 05/25/2018 0831      Component Value Date/Time   TSH 1.620 08/31/2016 1644   TSH 1.83 12/01/2015 0914   Results for ERCEL, NORMOYLE (MRN [id number]) as of 10/10/2018 10:29  Ref. Range 05/25/2018 08:31  Vitamin D, 25-Hydroxy Latest Ref Range: 30.0 - 100.0 ng/mL 53.9    OBESITY BEHAVIORAL INTERVENTION VISIT  Today's visit was # 28   Starting weight: 278 lbs Starting date: 08/31/16 Today's weight : Weight: 264 lb (119.7 kg)  Today's date: 10/10/2018 Total lbs lost to date: 14  ASK: We discussed the diagnosis of obesity with Marjie Skiff  Archey today and Mikaila agreed to give [name] permission to discuss obesity behavioral modification therapy today.  ASSESS: Lilyonna has the diagnosis of obesity and her BMI today is 43.9. Annjanette is in the action stage of change.   ADVISE: Gladiola was educated on the multiple health risks of obesity as well as the benefit of weight loss to improve her health. She was advised of the need for long term treatment and the importance of lifestyle modifications to improve her current health and to decrease her risk of future  health problems.  AGREE: Multiple dietary modification options and treatment options were discussed and Caitlan agreed to follow the recommendations documented in the above note.  ARRANGE: Raynesha was educated on the importance of frequent visits to treat obesity as outlined per CMS and USPSTF guidelines and agreed to schedule her next follow up appointment today.  I, Marcille Blanco, am acting as Location manager for Eber Jones, MD  I have reviewed the above documentation for accuracy and completeness, and I agree with the above. - Ilene Qua, MD

## 2018-10-10 NOTE — Telephone Encounter (Signed)
Copied from [place] [id number]. Topic: Quick Communication - See Telephone Encounter >> Oct 10, 2018  1:53 PM Blase Mess A wrote: CRM for notification. See Telephone encounter for: 10/10/18.  Levada Dy is calling from [facility] was going to send the patient for a MRI of the brain.  The patient is wanting a sedated MRI of the brian. The dr. Does not feel comfortable wanting PCP to refer. Please advise [phone]

## 2018-10-11 ENCOUNTER — Encounter: Payer: Self-pay | Admitting: Family Medicine

## 2018-10-11 LAB — INSULIN, RANDOM: INSULIN: 21.4 u[IU]/mL (ref 2.6–24.9)

## 2018-10-11 LAB — LIPID PANEL WITH LDL/HDL RATIO
Cholesterol, Total: 155 mg/dL (ref 100–199)
HDL: 40 mg/dL (ref >39)
LDL Calculated: 80 mg/dL (ref 0–99)
LDl/HDL Ratio: 2 ratio (ref 0.0–3.2)
Triglycerides: 173 mg/dL — ABNORMAL HIGH (ref 0–149)
VLDL Cholesterol Cal: 35 mg/dL (ref 5–40)

## 2018-10-11 LAB — HEMOGLOBIN A1C
Est. average glucose Bld gHb Est-mCnc: 103 mg/dL
Hgb A1c MFr Bld: 5.2 % (ref 4.8–5.6)

## 2018-10-11 LAB — COMPREHENSIVE METABOLIC PANEL
ALT: 18 IU/L (ref 0–32)
AST: 20 IU/L (ref 0–40)
Albumin/Globulin Ratio: 1.3 (ref 1.2–2.2)
Albumin: 3.9 g/dL (ref 3.8–4.8)
Alkaline Phosphatase: 84 IU/L (ref 39–117)
BUN/Creatinine Ratio: 11 (ref 9–23)
BUN: 12 mg/dL (ref 6–24)
Bilirubin Total: <0.2 mg/dL (ref 0.0–1.2)
CO2: 23 mmol/L (ref 20–29)
Calcium: 8.9 mg/dL (ref 8.7–10.2)
Chloride: 100 mmol/L (ref 96–106)
Creatinine, Ser: 1.06 mg/dL — ABNORMAL HIGH (ref 0.57–1.00)
GFR calc Af Amer: 74 mL/min/{1.73_m2} (ref >59)
GFR calc non Af Amer: 64 mL/min/{1.73_m2} (ref >59)
Globulin, Total: 3 g/dL (ref 1.5–4.5)
Glucose: 89 mg/dL (ref 65–99)
Potassium: 4.3 mmol/L (ref 3.5–5.2)
Sodium: 138 mmol/L (ref 134–144)
Total Protein: 6.9 g/dL (ref 6.0–8.5)

## 2018-10-11 LAB — VITAMIN D 25 HYDROXY (VIT D DEFICIENCY, FRACTURES): Vit D, 25-Hydroxy: 55.5 ng/mL (ref 30.0–100.0)

## 2018-10-11 LAB — VITAMIN B12: Vitamin B-12: 690 pg/mL (ref 232–1245)

## 2018-10-12 NOTE — Addendum Note (Signed)
Addended by: Ronnald Nian on: 10/12/2018 05:20 PM   Modules accepted: Orders

## 2018-10-12 NOTE — Telephone Encounter (Signed)
Order placed for MRI brain and orbit with and without contrast. Pt will require GA d/t her significant anxiety and claustrophobia. She has had MRI in the past and required GA. Pt is requesting GA and ophthalmology asked that I placed order.

## 2018-10-12 NOTE — Telephone Encounter (Signed)
Spoke with Thedore Mins from [facility], he said the scan needed for pt is MRI brain and Orbit with and/or without contrast. Thedore Mins said as long as there is an ok from you to do anesthesia then they would be okay with it. Just to add a note on the referral.

## 2018-10-12 NOTE — Telephone Encounter (Signed)
Danielle, Can you please call Levada Dy at [facility] to find out what specific scan he wants for pt. MRI brain with and/or without contrast? The patient is mentioning something about an MRI orbit. Since he is requesting I order the test, I need to know specifically when needs to be ordered. Thanks!

## 2018-10-12 NOTE — Telephone Encounter (Signed)
Left a VM at the [facility] for someone to give me a call back about getting specific scan figured out for our pt.

## 2018-10-13 ENCOUNTER — Telehealth: Payer: Self-pay | Admitting: Family Medicine

## 2018-10-13 ENCOUNTER — Encounter: Payer: Self-pay | Admitting: Neurology

## 2018-10-13 ENCOUNTER — Ambulatory Visit: Payer: 59 | Admitting: Neurology

## 2018-10-13 VITALS — BP 144/100 | HR 89 | Ht 65.0 in | Wt 270.0 lb

## 2018-10-13 DIAGNOSIS — Z9989 Dependence on other enabling machines and devices: Secondary | ICD-10-CM | POA: Diagnosis not present

## 2018-10-13 DIAGNOSIS — G4733 Obstructive sleep apnea (adult) (pediatric): Secondary | ICD-10-CM | POA: Diagnosis not present

## 2018-10-13 DIAGNOSIS — H81399 Other peripheral vertigo, unspecified ear: Secondary | ICD-10-CM

## 2018-10-13 NOTE — Telephone Encounter (Signed)
Talked to [facility] from radiology. She stated that the brain said without contrast and the Orbit said with and without she was calling to clarify if both needed to be with and without or if you wanted the brain just without. She stated that if you wanted them both with and without then you would need to put in another referral and that she would be looking for it.

## 2018-10-13 NOTE — Telephone Encounter (Signed)
Copied from [place]. Topic: General - Other >> Oct 13, 2018  2:52 PM Keene Breath wrote: Reason for CRM: Vivien Rota called from radiology to speak with the doctor regarding orders for an MRI of the brain for the patient.  She needs clarity on whether the patient should have the test with and without contrast.  Please verify and call back at [phone]

## 2018-10-13 NOTE — Progress Notes (Signed)
Subjective:    Patient ID: Caroline Gonzales is a 43 y.o. female.  HPI     Interim history:  [name] is a 43 year old right-handed woman with an underlying medical history of prediabetes, vitamin D deficiency, allergies, anxiety, back pain, mood disorder including anxiety, depression, PTSD per chart review, history of pneumonia in the past, former smoking and morbid obesity with a BMI of over 38, who presents for follow-up consultation of her obstructive sleep apnea after sleep study testing and starting AutoPap therapy. The patient is unaccompanied today. I first met her on 07/22/2017 at the request of her weight management PA, at which time she reported snoring and excessive daytime somnolence. She was advised to proceed with sleep study. She had a sleep study on 07/01/2018. Sleep latency was 18 minutes, REM latency delayed at 171 minutes, sleep efficiency 92.5%. She had a reduced percentage of REM sleep at 13.2%, slow-wave sleep was absent. Total AHI was borderline at 5.4 per hour, REM AHI in the moderate range at 13.8 per hour, supine AHI was 10.5 per hour. Average oxygen saturation was 90%, nadir was 75%. She was advised to proceed with AutoPap therapy to see if her symptoms improve.  Today, 10/13/2018: I reviewed her AutoPap compliance data from 09/12/2018 through 10/11/2018 which is a total of 30 days, during which time she was to her AutoPap 29 days with percent used days greater than 4 hours at 50%, indicating suboptimal compliance with an average usage of 4 hours and 35 minutes, residual AHI at goal at 0.5 per hour, 95th percentile pressure at 10.4 cm, leak acceptable with the 95th percentile at 9.4 L/m on a pressure range of 5 cm to 11 cm with EPR. She had slightly better compliance in the month of early December through early January with a percent used days greater than 4 hours of 60%, again suboptimal. She was set up in mid-November, 07/30/2018. She is struggling to find a mask that fits  well and is tolerable. Epworth sleepiness score is 4 out of 24 today. Around 09/14/2018 she received a full facemask. She feels like she can tolerate this better. Nevertheless, she does have almost nightly nasal congestion. She has found a friend to be useful but knows that she should not use it nightly. She does have a history of allergies and takes allergy medication but she does not take any nasal steroid spray.she is willing to continue with treatment. She continues to follow regularly with a weight management clinic. Her weight has plateaued.  The patient's allergies, current medications, family history, past medical history, past social history, past surgical history and problem list were reviewed and updated as appropriate.   Previously:  07/22/2017: (She) reports snoring and excessive daytime somnolence. I reviewed your office note from 06/22/2017. Her main complaint is tiredness during the day. She has been on sedating medications and is in the process of adjusting medication. She is now down to 100 mg on her Seroquel, previously on 800 mg each night. She does take clonazepam at night usually 1 mg, up to twice daily for anxiety. She does report stress. She has 2 younger children, and fourth and fifth grade, she works as an Therapist, sports, she is also in grad school. She denies nighttime nocturia or morning headaches or restless leg symptoms. She is a restless sleeper. Her husband has recently been diagnosed with sleep apnea and sleeps much better now which is reassuring to her. She reports claustrophobia and feels like she would not be able  to use a CPAP machine. Bedtime is around 9 and she is usually asleep by 10. She does not always sleep through the night. Wake up time is around 6:30 AM. She has a history of sleep talking and history of rare sleepwalking episodes. She reports that she had a sleep study several years ago, results are not available. She does report weight loss and reduction in her snoring since  her last sleep study. Her Epworth sleepiness score is 1 out of 24, fatigue score is 22 out of 63. She is married and lives with her husband and 2 children she quit smoking in 2002. She drinks alcohol occasionally, about once a month, she drinks caffeine in the form of diet soda, usually once per day.  Her Past Medical History Is Significant For: Past Medical History:  Diagnosis Date  . Allergy    Zyrtec, Allegra.  . Anxiety    followed by Dr. Toy Cookey  . Back pain   . Bipolar 1 disorder ([place])   . Complication of anesthesia   . Depression   . Fibroid   . Gallbladder problem   . Gastroparesis 09/14/2012   gastric emptying study in 2014  . GERD (gastroesophageal reflux disease)   . Pneumonia    2013ish  . PONV (postoperative nausea and vomiting)   . Pre-diabetes   . PTSD (post-traumatic stress disorder)   . Vitamin D deficiency     Her Past Surgical History Is Significant For: Past Surgical History:  Procedure Laterality Date  . ANKLE ARTHROSCOPY Left 2011  . CESAREAN SECTION    . CHOLECYSTECTOMY    . DILATION AND CURETTAGE OF UTERUS    . FOREIGN BODY REMOVAL Left 11/18/2016   Procedure: REMOVAL FOREIGN BODY EXTREMITY LEFT FOOT;  Surgeon: Trula Slade, DPM;  Location: [facility];  Service: Podiatry;  Laterality: Left;  . PILONIDAL CYST EXCISION  1990's  . TENDON REPAIR Left 2011   Left Ankle  . WISDOM TOOTH EXTRACTION  19090's    Her Family History Is Significant For: Family History  Problem Relation Age of Onset  . Cancer Mother        squamous cell carcinoma  . Hyperlipidemia Mother   . Depression Mother   . Anxiety disorder Mother   . Obesity Mother   . Heart disease Father 45       cardiomegaly, CHF; steroid use.  [name] Hyperlipidemia Father   . Hypertension Father   . Mental retardation Father   . High blood pressure Father   . Depression Father   . Anxiety disorder Father   . Obesity Father   . Cancer Maternal Grandmother   . Diabetes Maternal Grandmother   .  Heart disease Maternal Grandmother   . Hyperlipidemia Maternal Grandmother   . Hypertension Maternal Grandmother   . Stroke Maternal Grandmother   . Heart disease Paternal Grandmother   . Hypertension Paternal Grandmother   . Heart disease Paternal Grandfather   . Hyperlipidemia Paternal Grandfather   . Mental illness Paternal Grandfather   . Rheum arthritis Sister   . Breast cancer Maternal Aunt   . Thyroid cancer Paternal Aunt     Her Social History Is Significant For: Social History   Socioeconomic History  . Marital status: Married    Spouse name: Not on file  . Number of children: Not on file  . Years of education: Not on file  . Highest education level: Not on file  Occupational History  . Occupation: Therapist, sports  Employer: [employer]  Social Needs  . Financial resource strain: Not on file  . Food insecurity:    Worry: Not on file    Inability: Not on file  . Transportation needs:    Medical: Not on file    Non-medical: Not on file  Tobacco Use  . Smoking status: Former Smoker    Years: 10.00    Types: Cigarettes    Last attempt to quit: 2003    Years since quitting: 17.0  . Smokeless tobacco: Never Used  Substance and Sexual Activity  . Alcohol use: Yes    Alcohol/week: 0.0 standard drinks    Comment: 1-2 q 56month maybe  . Drug use: No  . Sexual activity: Yes    Partners: Male    Birth control/protection: I.U.D.    Comment: Mirena  Lifestyle  . Physical activity:    Days per week: Not on file    Minutes per session: Not on file  . Stress: Not on file  Relationships  . Social connections:    Talks on phone: Not on file    Gets together: Not on file    Attends religious service: Not on file    Active member of club or organization: Not on file    Attends meetings of clubs or organizations: Not on file    Relationship status: Not on file  Other Topics Concern  . 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      Seatbelt: 100%; no texting while driving often        Her Allergies Are:  Allergies  Allergen Reactions  . Penicillins Shortness Of Breath and Rash    Has patient had a PCN reaction causing immediate rash, facial/tongue/throat swelling, SOB or lightheadedness with hypotension: Yes Has patient had a PCN reaction causing severe rash involving mucus membranes or skin necrosis: Yes Has patient had a PCN reaction that required hospitalization: No Has patient had a PCN reaction occurring within the last 10 years: No If all of the above answers are "NO", then may proceed with Cephalosporin use.   . Tamiflu [Oseltamivir Phosphate] Anaphylaxis  . Cephalosporins Rash  . Latex Rash  :   Her Current Medications Are:  Outpatient Encounter Medications as of 10/13/2018  Medication Sig  . Cholecalciferol (VITAMIN D3) 5000 units CAPS Take 5,000 Units by mouth at bedtime.  . clonazePAM (KLONOPIN) 0.5 MG tablet Take 1 tablet (0.5 mg total) by mouth every 6 (six) hours as needed for anxiety.  . diazepam (VALIUM) 5 MG tablet 1-2 po about 1 30-60 minutes pre-MRI.  .Marland KitchenL-Methylfolate-Algae (DEPLIN 15) 15-90.314 MG CAPS Take by mouth daily.  .Marland KitchenlamoTRIgine (LAMICTAL) 150 MG tablet TAKE 1 TABLET BY MOUTH TWICE DAILY  . levonorgestrel (MIRENA) 20 MCG/24HR IUD 1 each by Intrauterine route once. Place 03/2013  . loratadine (CLARITIN) 10 MG tablet Take 10 mg by mouth at bedtime.   . metFORMIN (GLUCOPHAGE) 1000 MG tablet Take 1 tablet (1,000 mg total) by mouth daily with breakfast.  . Multiple Vitamin (  MULTIVITAMIN) tablet Take 1 tablet by mouth at bedtime.   . pantoprazole (PROTONIX) 40 MG tablet Take 40 mg by mouth daily.  . prazosin  (MINIPRESS) 2 MG capsule TAKE 2 CAPSULES BY MOUTH AT BEDTIME.  [name] propranolol (INDERAL) 40 MG tablet TAKE 1 TABLET BY MOUTH TWICE DAILY  . QUEtiapine (SEROQUEL) 200 MG tablet Take 200 mg by mouth at bedtime.   No facility-administered encounter medications on file as of 10/13/2018.   :  Review of Systems:  Out of a complete 14 point review of systems, all are reviewed and negative with the exception of these symptoms as listed below: Review of Systems  Neurological:       Pt presents today to discuss her auto pap. Pt says that she is struggling to find a good mask.  Epworth Sleepiness Scale 0= would never doze 1= slight chance of dozing 2= moderate chance of dozing 3= high chance of dozing  Sitting and reading: 0 Watching TV: 0 Sitting inactive in a public place (ex. Theater or meeting): 0 As a passenger in a car for an hour without a break: 0 Lying down to rest in the afternoon:  3 Sitting and talking to someone: 0 Sitting quietly after lunch (no alcohol): 1 In a car, while stopped in traffic: 0 Total: 4     Objective:  Neurological Exam  Physical Exam Physical Examination:   Vitals:   10/13/18 0807  BP: (!) 144/100  Pulse: 89    General Examination: The patient is a very pleasant 43 y.o. female in no acute distress. She appears well-developed and well-nourished and well groomed.   HEENT: Normocephalic, atraumatic, pupils are equal, round and reactive to light and accommodation. She wears corrective eyeglasses. Extraocular tracking is good without limitation to gaze excursion or nystagmus noted. Normal smooth pursuit is noted. Hearing is grossly intact. Face is symmetric with normal facial animation and normal facial sensation. Speech is clear with no dysarthria noted. There is no hypophonia. There is no lip, neck/head, jaw or voice tremor. Oropharynx exam reveals: moderate mouth dryness, good dental hygiene and mild airway crowding. Tongue protrudes centrally and palate  elevates symmetrically.   Chest: Clear to auscultation without wheezing, rhonchi or crackles noted.  Heart: S1+S2+0, regular and normal without murmurs, rubs or gallops noted.   Abdomen: Soft, non-tender and non-distended.  Extremities: There is no obvious change.   Skin: Warm and dry without trophic changes noted.  Musculoskeletal: exam reveals no obvious joint deformities, tenderness or joint swelling or erythema.   Neurologically:  Mental status: The patient is awake, alert and oriented in all 4 spheres. Her immediate and remote memory, attention, language skills and fund of knowledge are appropriate. There is no evidence of aphasia, agnosia, apraxia or anomia. Speech is clear with normal prosody and enunciation. Thought process is linear. Mood is normal and affect is normal.  Cranial nerves II - XII are as described above under HEENT exam.  Motor exam: Normal bulk, strength and tone is noted. There is no tremor in the hands. Fine motor skills and coordination: grossly intact.  Cerebellar testing: No dysmetria or intention tremor.  Sensory exam: intact to light touch in the upper and lower extremities.  Gait, station and balance: no obvious abnormality.   Assessment and Plan:   In summary, Caroline Gonzales is a very pleasant 43 year old female with an underlying medical history of prediabetes, vitamin D deficiency, allergies, anxiety, back pain, mood disorder including anxiety, depression, and morbid obesity, who  presents for follow consultation of her obstructive sleep apnea (OSA). Her baseline sleep study from 07/01/2018 showed overall mild or borderline obstructive sleep apnea more pronounced during REM sleep especially supine REM sleep. She has established treatment with AutoPap therapy. She is slightly suboptimal with her compliance for more than 4 hours but her compliance for usage is good. She is motivated to continue with treatment. She has found a better fitting mask this  last month. She is encouraged to continue with treatment and encouraged to pursue weight loss which can help reduce her sleep apnea and perhaps even eliminate the diagnosis with time. She is advised to work on her consistent usage with the AutoPap. She is encouraged to follow-up in 6 months routinely with one of our nurse practitioners. I answered all her questions today and she was in agreement. I spent 25 minutes in total face-to-face time with the patient, more than 50% of which was spent in counseling and coordination of care, reviewing test results, reviewing medication and discussing or reviewing the diagnosis of OSA, its prognosis and treatment options. Pertinent laboratory and imaging test results that were available during this visit with the patient were reviewed by me and considered in my medical decision making (see chart for details).

## 2018-10-13 NOTE — Patient Instructions (Addendum)
Please continue using your autoPAP regularly. While your insurance requires that you use PAP at least 4 hours each night on 70% of the nights, I recommend, that you not skip any nights and use it throughout the night if you can. Getting used to PAP and staying with the treatment long term does take time and patience and discipline. Untreated obstructive sleep apnea when it is moderate to severe can have an adverse impact on cardiovascular health and raise her risk for heart disease, arrhythmias, hypertension, congestive heart failure, stroke and diabetes. Untreated obstructive sleep apnea causes sleep disruption, nonrestorative sleep, and sleep deprivation. This can have an impact on your day to day functioning and cause daytime sleepiness and impairment of cognitive function, memory loss, mood disturbance, and problems focussing. Using PAP regularly can improve these symptoms.  You are almost there with your average usage, your goal is to be above 70%, currently at 50% for using the autoPAP for more than 4 hours.   We can see you in 6 months, with Ward Givens, nurse practitioner.

## 2018-10-13 NOTE — Telephone Encounter (Signed)
New order entered

## 2018-10-14 ENCOUNTER — Other Ambulatory Visit: Payer: Self-pay | Admitting: Physician Assistant

## 2018-10-14 MED ORDER — MECLIZINE HCL 25 MG PO TABS: 25.0000 mg | ORAL_TABLET | Freq: Three times a day (TID) | ORAL | 1 refills | Status: DC | PRN

## 2018-10-14 MED FILL — MECLIZINE 25 MG TABLET: 25 | 20 days supply | Qty: 60 | Fill #0

## 2018-10-17 ENCOUNTER — Telehealth: Payer: Self-pay

## 2018-10-17 MED FILL — QUETIAPINE FUMARATE 200 MG: 200 | 90 days supply | Qty: 90 | Fill #0

## 2018-10-17 NOTE — Telephone Encounter (Signed)
Copied from [place] [id number]. Topic: General - Inquiry >> Oct 17, 2018 10:27 AM Lionel December wrote: Reason for CRM: Levada Dy from [facility] called and needed a call back about pt getting an MRI. >> Oct 17, 2018 10:29 AM Lionel December wrote: Levada Dy from [facility] calling about pt needing an MRI. She is requesting a callback

## 2018-10-17 NOTE — Telephone Encounter (Signed)
Called Angela from [facility] she is aware that pt.is scheduled at [facility] for her MRI

## 2018-10-22 ENCOUNTER — Ambulatory Visit (HOSPITAL_COMMUNITY): Payer: 59

## 2018-10-26 ENCOUNTER — Ambulatory Visit (INDEPENDENT_AMBULATORY_CARE_PROVIDER_SITE_OTHER): Payer: 59 | Admitting: Family Medicine

## 2018-10-26 ENCOUNTER — Encounter (INDEPENDENT_AMBULATORY_CARE_PROVIDER_SITE_OTHER): Payer: Self-pay | Admitting: Family Medicine

## 2018-10-26 VITALS — BP 121/79 | HR 89 | Temp 97.6°F | Ht 65.0 in | Wt 263.0 lb

## 2018-10-26 DIAGNOSIS — E8881 Metabolic syndrome: Secondary | ICD-10-CM

## 2018-10-26 DIAGNOSIS — E559 Vitamin D deficiency, unspecified: Secondary | ICD-10-CM

## 2018-10-26 DIAGNOSIS — Z6841 Body Mass Index (BMI) 40.0 and over, adult: Secondary | ICD-10-CM

## 2018-10-27 ENCOUNTER — Encounter: Payer: Self-pay | Admitting: Neurology

## 2018-10-27 ENCOUNTER — Telehealth: Payer: Self-pay | Admitting: Neurology

## 2018-10-27 DIAGNOSIS — Z789 Other specified health status: Secondary | ICD-10-CM

## 2018-10-27 NOTE — Telephone Encounter (Signed)
Responded to pt's mychart message. Order for cpap d/c sent to [facility] via community message. Confirmation received that the order transmitted was successful.

## 2018-10-27 NOTE — Telephone Encounter (Signed)
Discontinuation for AutoPap order placed, secondary to intolerance, please see patient email also. Please remind patient to avoid sleeping on her back and to continue to work on weight loss. We can see her back as needed. We can cancel her FU appt.

## 2018-10-27 NOTE — Progress Notes (Signed)
Office: [phone]  /  Fax: [phone]   HPI:   Chief Complaint: OBESITY Caroline Gonzales is here to discuss her progress with her obesity treatment plan. She is on the portion control better and make smarter food choices, such as increase vegetables and decrease simple carbohydrates and is following her eating plan approximately 50 % of the time. She states she is exercising 0 minutes 0 times per week. Caroline Gonzales has been going to [employer] for breakfast and lunch at work. She is trying to get protein in at each meal. She is waiting for brain MRI in 2 weeks. She plans to see a counselor next week as well.  Her weight is 263 lb (119.3 kg) today and has had a weight loss of 1 pound over a period of 2 weeks since her last visit. She has lost 15 lbs since starting treatment with [name].  Insulin Resistance Caroline Gonzales has a diagnosis of insulin resistance based on her elevated fasting insulin level >5. Her insulin level haas significantly improved. Although Caroline Gonzales's blood glucose readings are still under good control, insulin resistance puts her at greater risk of metabolic syndrome and diabetes. She denies GI upset on metformin and continues to work on diet and exercise to decrease risk of diabetes.  Vitamin D Deficiency [name] has a diagnosis of vitamin D deficiency. She is currently taking OTC Vit D. She notes fatigue and denies nausea, vomiting or muscle weakness.  ASSESSMENT AND PLAN:  Insulin resistance  Vitamin D deficiency  Class 3 severe obesity with serious comorbidity and body mass index (BMI) of 40.0 to 44.9 in adult, unspecified obesity type ([place])  PLAN:  Insulin Resistance Zo will continue to work on weight loss, exercise, and decreasing simple carbohydrates in her diet to help decrease the risk of diabetes. We dicussed metformin including benefits and risks. She was informed that eating too many simple carbohydrates or too many calories at one sitting increases the likelihood of  GI side effects. Lekisha agrees to continue taking metformin and she agrees to follow up with our clinic in 3 weeks as directed to monitor her progress.  Vitamin D Deficiency Caroline Gonzales was informed that low vitamin D levels contributes to fatigue and are associated with obesity, breast, and colon cancer. [name] agrees to continue taking Vit D replacement and will follow up for routine testing of vitamin D, at least 2-3 times per year. She was informed of the risk of over-replacement of vitamin D and agrees to not increase her dose unless she discusses this with [name] first. Caroline Gonzales agrees to follow up with our clinic in 3 weeks.  I spent > than 50% of the 15 minute visit on counseling as documented in the note.  Obesity Caroline Gonzales is currently in the action stage of change. As such, her goal is to continue with weight loss efforts She has agreed to portion control better and make smarter food choices, such as increase vegetables and decrease simple carbohydrates  Caroline Gonzales has been instructed to work up to a goal of 150 minutes of combined cardio and strengthening exercise per week for weight loss and overall health benefits. We discussed the following Behavioral Modification Strategies today: increasing lean protein intake, increasing vegetables and work on meal planning and easy cooking plans, and planning for success   Caroline Gonzales has agreed to follow up with our clinic in 3 weeks. She was informed of the importance of frequent follow up visits to maximize her success with intensive lifestyle modifications for her multiple health conditions.  ALLERGIES: Allergies  Allergen Reactions  . Penicillins Shortness Of Breath and Rash    Has patient had a PCN reaction causing immediate rash, facial/tongue/throat swelling, SOB or lightheadedness with hypotension: Yes Has patient had a PCN reaction causing severe rash involving mucus membranes or skin necrosis: Yes Has patient had a PCN reaction that required  hospitalization: No Has patient had a PCN reaction occurring within the last 10 years: No If all of the above answers are "NO", then may proceed with Cephalosporin use.   . Tamiflu [Oseltamivir Phosphate] Anaphylaxis  . Cephalosporins Rash  . Latex Rash    MEDICATIONS: Current Outpatient Medications on File Prior to Visit  Medication Sig Dispense Refill  . Cholecalciferol (VITAMIN D3) 5000 units CAPS Take 5,000 Units by mouth at bedtime.    . clonazePAM (KLONOPIN) 0.5 MG tablet Take 1 tablet (0.5 mg total) by mouth every 6 (six) hours as needed for anxiety. 120 tablet 2  . diazepam (VALIUM) 5 MG tablet 1-2 po about 1 30-60 minutes pre-MRI. 2 tablet 0  . L-Methylfolate-Algae (DEPLIN 15) 15-90.314 MG CAPS Take by mouth daily.    [name] lamoTRIgine (LAMICTAL) 150 MG tablet TAKE 1 TABLET BY MOUTH TWICE DAILY 180 tablet 0  . levonorgestrel (MIRENA) 20 MCG/24HR IUD 1 each by Intrauterine route once. Place 03/2013    . loratadine (CLARITIN) 10 MG tablet Take 10 mg by mouth at bedtime.     . meclizine (ANTIVERT) 25 MG tablet Take 1 tablet (25 mg total) by mouth 3 (three) times daily as needed for dizziness. 60 tablet 1  . metFORMIN (GLUCOPHAGE) 1000 MG tablet Take 1 tablet (1,000 mg total) by mouth daily with breakfast. 30 tablet 0  . Multiple Vitamin (MULTIVITAMIN) tablet Take 1 tablet by mouth at bedtime.     . pantoprazole (PROTONIX) 40 MG tablet Take 40 mg by mouth daily.    . prazosin (MINIPRESS) 2 MG capsule TAKE 2 CAPSULES BY MOUTH AT BEDTIME. 180 capsule 0  . propranolol (INDERAL) 40 MG tablet TAKE 1 TABLET BY MOUTH TWICE DAILY 60 tablet 2  . QUEtiapine (SEROQUEL) 200 MG tablet TAKE 1 TABLET BY MOUTH AT BEDTIME 90 tablet 0   No current facility-administered medications on file prior to visit.     PAST MEDICAL HISTORY: Past Medical History:  Diagnosis Date  . Allergy    Zyrtec, Allegra.  . Anxiety    followed by Dr. Toy Cookey  . Back pain   . Bipolar 1 disorder ([place])   . Complication of  anesthesia   . Depression   . Fibroid   . Gallbladder problem   . Gastroparesis 09/14/2012   gastric emptying study in 2014  . GERD (gastroesophageal reflux disease)   . Pneumonia    2013ish  . PONV (postoperative nausea and vomiting)   . Pre-diabetes   . PTSD (post-traumatic stress disorder)   . Vitamin D deficiency     PAST SURGICAL HISTORY: Past Surgical History:  Procedure Laterality Date  . ANKLE ARTHROSCOPY Left 2011  . CESAREAN SECTION    . CHOLECYSTECTOMY    . DILATION AND CURETTAGE OF UTERUS    . FOREIGN BODY REMOVAL Left 11/18/2016   Procedure: REMOVAL FOREIGN BODY EXTREMITY LEFT FOOT;  Surgeon: Trula Slade, DPM;  Location: [facility];  Service: Podiatry;  Laterality: Left;  . PILONIDAL CYST EXCISION  1990's  . TENDON REPAIR Left 2011   Left Ankle  . WISDOM TOOTH EXTRACTION  19090's    SOCIAL HISTORY: Social History  Tobacco Use  . Smoking status: Former Smoker    Years: 10.00    Types: Cigarettes    Last attempt to quit: 2003    Years since quitting: 17.1  . Smokeless tobacco: Never Used  Substance Use Topics  . Alcohol use: Yes    Alcohol/week: 0.0 standard drinks    Comment: 1-2 q 74months maybe  . Drug use: No    FAMILY HISTORY: Family History  Problem Relation Age of Onset  . Cancer Mother        squamous cell carcinoma  . Hyperlipidemia Mother   . Depression Mother   . Anxiety disorder Mother   . Obesity Mother   . Heart disease Father 84       cardiomegaly, CHF; steroid use.  [name] Hyperlipidemia Father   . Hypertension Father   . Mental retardation Father   . High blood pressure Father   . Depression Father   . Anxiety disorder Father   . Obesity Father   . Cancer Maternal Grandmother   . Diabetes Maternal Grandmother   . Heart disease Maternal Grandmother   . Hyperlipidemia Maternal Grandmother   . Hypertension Maternal Grandmother   . Stroke Maternal Grandmother   . Heart disease Paternal Grandmother   . Hypertension Paternal  Grandmother   . Heart disease Paternal Grandfather   . Hyperlipidemia Paternal Grandfather   . Mental illness Paternal Grandfather   . Rheum arthritis Sister   . Breast cancer Maternal Aunt   . Thyroid cancer Paternal Aunt     ROS: Review of Systems  Constitutional: Positive for malaise/fatigue and weight loss.  Gastrointestinal: Negative for nausea and vomiting.  Musculoskeletal:       Negative muscle weakness    PHYSICAL EXAM: Blood pressure 121/79, pulse 89, temperature 97.6 F (36.4 C), temperature source Oral, height 5\' 5"  (1.651 m), weight 263 lb (119.3 kg), SpO2 98 %. Body mass index is 43.77 kg/m. Physical Exam Vitals signs reviewed.  Constitutional:      Appearance: Normal appearance. She is obese.  Cardiovascular:     Rate and Rhythm: Normal rate.     Pulses: Normal pulses.  Pulmonary:     Effort: Pulmonary effort is normal.     Breath sounds: Normal breath sounds.  Musculoskeletal: Normal range of motion.  Skin:    General: Skin is warm and dry.  Neurological:     Mental Status: She is alert and oriented to person, place, and time.  Psychiatric:        Mood and Affect: Mood normal.        Behavior: Behavior normal.     RECENT LABS AND TESTS: BMET    Component Value Date/Time   NA 138 10/10/2018 0829   K 4.3 10/10/2018 0829   CL 100 10/10/2018 0829   CO2 23 10/10/2018 0829   GLUCOSE 89 10/10/2018 0829   GLUCOSE 87 09/12/2018 1636   BUN 12 10/10/2018 0829   CREATININE 1.06 (H) 10/10/2018 0829   CREATININE 0.95 06/27/2016 0933   CALCIUM 8.9 10/10/2018 0829   GFRNONAA 64 10/10/2018 0829   GFRAA 74 10/10/2018 0829   Lab Results  Component Value Date   HGBA1C 5.2 10/10/2018   HGBA1C 5.3 05/25/2018   HGBA1C 5.4 02/10/2018   HGBA1C 5.5 10/07/2017   HGBA1C 5.1 06/22/2017   Lab Results  Component Value Date   INSULIN 21.4 10/10/2018   INSULIN 87.4 (H) 05/25/2018   INSULIN 20.3 02/10/2018   INSULIN 15.6 10/07/2017   INSULIN  25.2 (H) 06/22/2017    CBC    Component Value Date/Time   WBC 5.5 01/05/2018 0915   RBC 4.02 01/05/2018 0915   HGB 13.3 09/12/2018 1636   HGB 11.2 06/22/2017 0904   HCT 39.0 09/12/2018 1636   HCT 34.8 06/22/2017 0904   PLT 285 01/05/2018 0915   PLT 332 11/12/2016 1651   MCV 88.1 01/05/2018 0915   MCV 86 06/22/2017 0904   MCH 27.6 01/05/2018 0915   MCHC 31.4 01/05/2018 0915   RDW 14.4 01/05/2018 0915   RDW 15.7 (H) 06/22/2017 0904   LYMPHSABS 1.5 06/22/2017 0904   MONOABS 891 04/30/2016 0947   EOSABS 0.2 06/22/2017 0904   BASOSABS 0.0 06/22/2017 0904   Iron/TIBC/Ferritin/ %Sat    Component Value Date/Time   IRON 53 08/31/2016 1644   TIBC 280 08/31/2016 1644   FERRITIN 36 08/31/2016 1644   IRONPCTSAT 19 08/31/2016 1644   Lipid Panel     Component Value Date/Time   CHOL 155 10/10/2018 0829   TRIG 173 (H) 10/10/2018 0829   HDL 40 10/10/2018 0829   CHOLHDL 3.5 02/10/2018 0749   CHOLHDL 4.1 12/01/2015 0914   VLDL 37 (H) 12/01/2015 0914   LDLCALC 80 10/10/2018 0829   Hepatic Function Panel     Component Value Date/Time   PROT 6.9 10/10/2018 0829   ALBUMIN 3.9 10/10/2018 0829   AST 20 10/10/2018 0829   ALT 18 10/10/2018 0829   ALKPHOS 84 10/10/2018 0829   BILITOT <0.2 10/10/2018 0829      Component Value Date/Time   TSH 1.620 08/31/2016 1644   TSH 1.83 12/01/2015 0914      OBESITY BEHAVIORAL INTERVENTION VISIT  Today's visit was # 63   Starting weight: 278 lbs Starting date: 08/31/16 Today's weight : 263 lbs  Today's date: 10/26/2018 Total lbs lost to date: 15    ASK: We discussed the diagnosis of obesity with Marjie Skiff Mccarter today and Shiela agreed to give [name] permission to discuss obesity behavioral modification therapy today.  ASSESS: Nicloe has the diagnosis of obesity and her BMI today is 43.77 Charrie is in the action stage of change   ADVISE: Lanyah was educated on the multiple health risks of obesity as well as the benefit of weight loss to improve her  health. She was advised of the need for long term treatment and the importance of lifestyle modifications to improve her current health and to decrease her risk of future health problems.  AGREE: Multiple dietary modification options and treatment options were discussed and  Kennady agreed to follow the recommendations documented in the above note.  ARRANGE: Aliah was educated on the importance of frequent visits to treat obesity as outlined per CMS and USPSTF guidelines and agreed to schedule her next follow up appointment today.  I, Trixie Dredge, am acting as transcriptionist for Ilene Qua, MD  I have reviewed the above documentation for accuracy and completeness, and I agree with the above. - Ilene Qua, MD

## 2018-10-28 ENCOUNTER — Telehealth: Payer: Self-pay | Admitting: Physician Assistant

## 2018-10-28 NOTE — Telephone Encounter (Signed)
Patient stated she is on FMLA she needs a letter for an extension bc the FMLA expires 11/16/2018 patient has appt., 11/09/2018 didn't know if it would be enough time to complete paperwork for extension.  Please advise

## 2018-10-28 NOTE — Telephone Encounter (Signed)
Is this the annual (or every 6 month) routine paperwork we do?  If so, it shouldn't take long.  BUT, if there are no changes to the frequency of FMLA needed or the length of time that she needs to be out, if she will go ahead and fax the FMLA form to [name], we can fill it out just like we have in the past.  If there are changes that need to be made though I suggest she wait until our appointment and will discuss them and then fill out the forms

## 2018-10-30 DIAGNOSIS — G4733 Obstructive sleep apnea (adult) (pediatric): Secondary | ICD-10-CM | POA: Diagnosis not present

## 2018-10-31 DIAGNOSIS — F4310 Post-traumatic stress disorder, unspecified: Secondary | ICD-10-CM | POA: Diagnosis not present

## 2018-10-31 MED FILL — clonazePAM 0.5 MG TABS: 0.5 | 30 days supply | Qty: 120 | Fill #2

## 2018-11-03 MED FILL — PROPRANOLOL 40 MG TABLET: 40 | 30 days supply | Qty: 60 | Fill #2

## 2018-11-07 ENCOUNTER — Ambulatory Visit: Payer: 59 | Admitting: Physician Assistant

## 2018-11-08 ENCOUNTER — Other Ambulatory Visit: Payer: Self-pay

## 2018-11-08 ENCOUNTER — Encounter (HOSPITAL_COMMUNITY): Payer: Self-pay | Admitting: *Deleted

## 2018-11-08 NOTE — Progress Notes (Addendum)
Pt denies SOB, chest pain, and being under the care of a cardiologist. Pt denies having a stress test and cardiac cath but stated that an echo was performed > 10 years ago ( while pregnant). Pt made aware to stop taking vitamins and herbal medications. Pt okay with holding L-Methylfolate-Algae (DEPLIN 15 ). Pt denies recent labs. Pt stated that she does not check her blood glucose " I take Metformin for weight loss." Pt made aware to not take Metformin on day of procedure. Pt verbalized understanding of all pre-op instructions.

## 2018-11-09 ENCOUNTER — Encounter: Payer: Self-pay | Admitting: Physician Assistant

## 2018-11-09 ENCOUNTER — Ambulatory Visit: Payer: 59 | Admitting: Physician Assistant

## 2018-11-09 VITALS — BP 133/77 | HR 80

## 2018-11-09 DIAGNOSIS — F411 Generalized anxiety disorder: Secondary | ICD-10-CM | POA: Diagnosis not present

## 2018-11-09 DIAGNOSIS — F331 Major depressive disorder, recurrent, moderate: Secondary | ICD-10-CM

## 2018-11-09 DIAGNOSIS — F4310 Post-traumatic stress disorder, unspecified: Secondary | ICD-10-CM | POA: Diagnosis not present

## 2018-11-09 DIAGNOSIS — G4733 Obstructive sleep apnea (adult) (pediatric): Secondary | ICD-10-CM | POA: Diagnosis not present

## 2018-11-09 MED ORDER — LAMOTRIGINE 150 MG PO TABS: ORAL_TABLET | ORAL | 0 refills | Status: DC

## 2018-11-09 NOTE — Progress Notes (Signed)
[facility] Check  Patient ID: Caroline Gonzales,  MRN: [id number]  PCP: Ronnald Nian, DO  Date of Evaluation: 11/09/2018 Time spent:25 minutes  Chief Complaint:  Chief Complaint    Follow-up; Anxiety      HISTORY/CURRENT STATUS: HPI For routine med check, but not doing well.  Patient states she is not doing well because of some health problems she is having right now.  See below medical history.  States she is scared to death.  She is a [employer] and states "I know I am catastrophizing.  I have the patient that has been diagnosed with bilateral glioblastoma and that is all I can think that may be wrong with me.  I do have a family history of neurofibromatosis and have had some skin lesions myself.  They are going to be looking for that in the MRI.  Hopefully it is nothing serious.  The PCP has said that if the MRI is negative then the problems are likely related to the psych meds.  I do not want to have to change medications because mentally I am doing so good.  We have worked so hard to get to this place that I would hate to have to mess anything up.  We will just wait and see what the MRI shows.".  Because of the increased anxiety, she has been trying to listen to audiobooks, and doing jigsaw puzzles.  Those things help to relax her.  She is having some trouble sleeping because of the anxiety.  She is also having nightmares on occasion.  The Klonopin does help when needed.  She is still seeing her counselor every week which is very helpful.  Individual Medical History/ Review of Systems: Changes? :Yes  Since LOV, has fallen, has dizziness, tinnitus, H/A,  has seen Ophthalmologist, PCP, has had EKGs, Labs are good.  CT Head was nl.  MRI of head under general anesthesia. "I'm terrified that they'll find something and I'm terrified if they don't then she's been told it's probably related to psych meds.   Past medications for mental health diagnoses include: Prozac, Paxil,  Effexor, Norpramin, Wellbutrin, doxepin, Elavil, Geodon, Abilify, Lexapro, Trileptal, Ambien, Lunesta, Sonata, trazodone, Restoril, Remeron, Seroquel, Klonopin, propranolol, Lamictal, Deplin, Sonata,  Allergies: Penicillins; Tamiflu [oseltamivir phosphate]; Cephalosporins; and Latex  Current Medications:  Current Outpatient Medications:  .  Cholecalciferol (VITAMIN D3) 5000 units CAPS, Take 5,000 Units by mouth at bedtime., Disp: , Rfl:  .  clonazePAM (KLONOPIN) 0.5 MG tablet, Take 1 tablet (0.5 mg total) by mouth every 6 (six) hours as needed for anxiety. (Patient taking differently: Take 0.5-1.5 mg by mouth See admin instructions. Take 1.5 mg at bedtime, may take an additional 0.5 mg dose as needed for anxiety), Disp: 120 tablet, Rfl: 2 .  fexofenadine (ALLEGRA) 180 MG tablet, Take 180 mg by mouth at bedtime., Disp: , Rfl:  .  ibuprofen (ADVIL,MOTRIN) 200 MG tablet, Take 400 mg by mouth daily as needed for headache or moderate pain., Disp: , Rfl:  .  L-Methylfolate-Algae (DEPLIN 15) 15-90.314 MG CAPS, Take 1 capsule by mouth at bedtime. , Disp: , Rfl:  .  lamoTRIgine (LAMICTAL) 150 MG tablet, TAKE 1 TABLET BY MOUTH TWICE DAILY, Disp: 180 tablet, Rfl: 0 .  levonorgestrel (MIRENA) 20 MCG/24HR IUD, 1 each by Intrauterine route once. , Disp: , Rfl:  .  meclizine (ANTIVERT) 25 MG tablet, Take 1 tablet (25 mg total) by mouth 3 (three) times daily as needed for dizziness., Disp: 60  tablet, Rfl: 1 .  metFORMIN (GLUCOPHAGE) 1000 MG tablet, Take 1 tablet (1,000 mg total) by mouth daily with breakfast., Disp: 30 tablet, Rfl: 0 .  Multiple Vitamin (MULTIVITAMIN) tablet, Take 1 tablet by mouth at bedtime. , Disp: , Rfl:  .  pantoprazole (PROTONIX) 40 MG tablet, Take 40 mg by mouth at bedtime. , Disp: , Rfl:  .  prazosin (MINIPRESS) 2 MG capsule, TAKE 2 CAPSULES BY MOUTH AT BEDTIME. (Patient taking differently: Take 4 mg by mouth at bedtime. ), Disp: 180 capsule, Rfl: 0 .  propranolol (INDERAL) 40 MG tablet,  TAKE 1 TABLET BY MOUTH TWICE DAILY (Patient taking differently: Take 40 mg by mouth 2 (two) times daily. ), Disp: 60 tablet, Rfl: 2 .  QUEtiapine (SEROQUEL) 200 MG tablet, TAKE 1 TABLET BY MOUTH AT BEDTIME (Patient taking differently: Take 200 mg by mouth at bedtime. ), Disp: 90 tablet, Rfl: 0 .  diazepam (VALIUM) 5 MG tablet, 1-2 po about 1 30-60 minutes pre-MRI. (Patient not taking: Reported on 11/09/2018), Disp: 2 tablet, Rfl: 0 Medication Side Effects: Uncertain if medications are causing dizziness and other symptoms.  Family Medical/ Social History: Changes? No  MENTAL HEALTH EXAM:  Blood pressure 133/77, pulse 80.There is no height or weight on file to calculate BMI.  General Appearance: Casual, Well Groomed and Obese  Eye Contact:  Good  Speech:  Clear and Coherent  Volume:  Normal  Mood:  Anxious  Affect:  Anxious  Thought Process:  Goal Directed  Orientation:  Full (Time, Place, and Person)  Thought Content: Logical   Suicidal Thoughts:  No  Homicidal Thoughts:  No  Memory:  WNL  Judgement:  Good  Insight:  Good  Psychomotor Activity:  Normal  Concentration:  Concentration: Good  Recall:  Good  Fund of Knowledge: Good  Language: Good  Assets:  Desire for Improvement  ADL's:  Intact  Cognition: WNL  Prognosis:  Good    DIAGNOSES:    ICD-10-CM   1. PTSD (post-traumatic stress disorder) F43.10   2. Generalized anxiety disorder F41.1   3. Obstructive sleep apnea G47.33   4. Major depressive disorder, recurrent episode, moderate ([place]) F33.1     Receiving Psychotherapy: Yes    RECOMMENDATIONS: I spent 25 minutes with her and 50% of that time was in counseling concerning psych meds.  If indeed the MRI is negative and there are no conclusive answers to her symptoms, I will discuss her case with Dr. Clovis Pu.  The medications that she takes from me are not commonly the cause of her symptoms but I will discuss with him and get his opinion.  She verbalizes understanding. Try  to encourage her to wait on the results of the MRI before worrying.  I know that is extremely difficult to do however. Continue Klonopin 0.5 mg every 6 hours as needed. Continue Lamictal 1 twice daily. Continue prazosin 4 mg nightly. Continue propranolol 40 mg twice daily. Continue Seroquel 200 mg nightly. Continue psychotherapy. FMLA to be continued for intermittent, 3 episodes per month with possible 2 days for each episode.  And 3 hours/week for appointments.  This will be good for 1 year beginning 11/22/2018. Return in 4 weeks or sooner as needed.   Donnal Moat, PA-C   This record has been created using [employer].  Chart creation errors have been sought, but may not always have been located and corrected. Such creation errors do not reflect on the standard of medical care.

## 2018-11-10 ENCOUNTER — Ambulatory Visit (HOSPITAL_COMMUNITY): Payer: 59 | Admitting: Physician Assistant

## 2018-11-10 ENCOUNTER — Ambulatory Visit (HOSPITAL_COMMUNITY): Payer: 59

## 2018-11-10 ENCOUNTER — Other Ambulatory Visit: Payer: Self-pay

## 2018-11-10 ENCOUNTER — Encounter (HOSPITAL_COMMUNITY): Payer: Self-pay

## 2018-11-10 ENCOUNTER — Ambulatory Visit (HOSPITAL_COMMUNITY)
Admission: RE | Admit: 2018-11-10 | Discharge: 2018-11-10 | Disposition: A | Discharge status: Home / Self Care | Payer: 59 | Attending: Family Medicine | Admitting: Family Medicine

## 2018-11-10 ENCOUNTER — Encounter (HOSPITAL_COMMUNITY)
Admission: RE | Disposition: A | Discharge status: Home / Self Care | Payer: Self-pay | Source: Home / Self Care | Attending: Family Medicine

## 2018-11-10 ENCOUNTER — Ambulatory Visit (HOSPITAL_COMMUNITY)
Admission: RE | Admit: 2018-11-10 | Discharge: 2018-11-10 | Disposition: A | Discharge status: Home / Self Care | Payer: 59 | Source: Ambulatory Visit | Attending: Family Medicine | Admitting: Family Medicine

## 2018-11-10 DIAGNOSIS — H81399 Other peripheral vertigo, unspecified ear: Secondary | ICD-10-CM | POA: Insufficient documentation

## 2018-11-10 DIAGNOSIS — R42 Dizziness and giddiness: Secondary | ICD-10-CM | POA: Insufficient documentation

## 2018-11-10 DIAGNOSIS — H5509 Other forms of nystagmus: Secondary | ICD-10-CM | POA: Insufficient documentation

## 2018-11-10 DIAGNOSIS — H5500 Unspecified nystagmus: Secondary | ICD-10-CM | POA: Diagnosis not present

## 2018-11-10 DIAGNOSIS — Z539 Procedure and treatment not carried out, unspecified reason: Secondary | ICD-10-CM | POA: Insufficient documentation

## 2018-11-10 HISTORY — DX: Headache: R51

## 2018-11-10 HISTORY — DX: Headache, unspecified: R51.9

## 2018-11-10 HISTORY — DX: Tinnitus, unspecified ear: H93.19

## 2018-11-10 HISTORY — PX: RADIOLOGY WITH ANESTHESIA: SHX6223

## 2018-11-10 HISTORY — DX: Presence of spectacles and contact lenses: Z97.3

## 2018-11-10 HISTORY — DX: Sleep apnea, unspecified: G47.30

## 2018-11-10 LAB — BASIC METABOLIC PANEL
Anion gap: 8 (ref 5–15)
BUN: 13 mg/dL (ref 6–20)
CO2: 21 mmol/L — ABNORMAL LOW (ref 22–32)
Calcium: 8.6 mg/dL — ABNORMAL LOW (ref 8.9–10.3)
Chloride: 106 mmol/L (ref 98–111)
Creatinine, Ser: 0.94 mg/dL (ref 0.44–1.00)
GFR calc Af Amer: >60 mL/min (ref >60)
GFR calc non Af Amer: >60 mL/min (ref >60)
Glucose, Bld: 94 mg/dL (ref 70–99)
Potassium: 4.4 mmol/L (ref 3.5–5.1)
Sodium: 135 mmol/L (ref 135–145)

## 2018-11-10 LAB — POCT PREGNANCY, URINE: Preg Test, Ur: NEGATIVE

## 2018-11-10 SURGERY — MRI WITH ANESTHESIA
Anesthesia: General

## 2018-11-10 MED ORDER — GADOBUTROL 1 MMOL/ML IV SOLN
10.0000 mL | Freq: Once | INTRAVENOUS | Status: AC | PRN
  Administered 2018-11-10: 10 mL via INTRAVENOUS

## 2018-11-10 MED ORDER — SCOPOLAMINE 1 MG/3DAYS TD PT72
1.0000 | MEDICATED_PATCH | TRANSDERMAL | Status: DC
  Administered 2018-11-10: 1.5 mg via TRANSDERMAL

## 2018-11-10 MED ORDER — SCOPOLAMINE 1 MG/3DAYS TD PT72
MEDICATED_PATCH | TRANSDERMAL | Status: AC
  Filled 2018-11-10: qty 1

## 2018-11-10 NOTE — Anesthesia Preprocedure Evaluation (Addendum)
Anesthesia Evaluation  Patient identified by MRN, date of birth, ID band Patient awake    Reviewed: Allergy & Precautions, NPO status , Patient's Chart, lab work & pertinent test results  History of Anesthesia Complications (+) PONV and history of anesthetic complications  Airway Mallampati: II  TM Distance: >3 FB Neck ROM: Full  Mouth opening: Limited Mouth Opening  Dental no notable dental hx. (+) Teeth Intact, Dental Advisory Given   Pulmonary sleep apnea (does not use CPAP) , former smoker,    Pulmonary exam normal breath sounds clear to auscultation       Cardiovascular negative cardio ROS Normal cardiovascular exam Rhythm:Regular Rate:Normal     Neuro/Psych PSYCHIATRIC DISORDERS Anxiety Depression Bipolar Disorder negative neurological ROS     GI/Hepatic Neg liver ROS, GERD  Controlled,  Endo/Other  Morbid obesity  Renal/GU negative Renal ROS  negative genitourinary   Musculoskeletal negative musculoskeletal ROS (+)   Abdominal   Peds negative pediatric ROS (+)  Hematology negative hematology ROS (+)   Anesthesia Other Findings   Reproductive/Obstetrics negative OB ROS                           Anesthesia Physical Anesthesia Plan  ASA: III  Anesthesia Plan: General   Post-op Pain Management:    Induction: Intravenous  PONV Risk Score and Plan: 4 or greater and Ondansetron, Dexamethasone and Midazolam  Airway Management Planned:   Additional Equipment:   Intra-op Plan:   Post-operative Plan: Extubation in OR  Informed Consent: I have reviewed the patients History and Physical, chart, labs and discussed the procedure including the risks, benefits and alternatives for the proposed anesthesia with the patient or authorized representative who has indicated his/her understanding and acceptance.     Dental advisory given  Plan Discussed with: CRNA  Anesthesia Plan  Comments:         Anesthesia Quick Evaluation

## 2018-11-10 NOTE — Transfer of Care (Signed)
Immediate Anesthesia Transfer of Care Note  Patient: Caroline Gonzales  Procedure(s) Performed: MRI WITH ANESTHESIA OF BRAIN AND ORBITS WITH AND WITHOUT CONTRAST (N/A )  Patient Location: PACU  Anesthesia Type:General  Level of Consciousness: awake, oriented and patient cooperative  Airway & Oxygen Therapy: Patient Spontanous Breathing and Patient connected to nasal cannula oxygen  Post-op Assessment: Report given to RN and Post -op Vital signs reviewed and stable  Post vital signs: Reviewed  Last Vitals:  Vitals Value Taken Time  BP 106/75 11/10/2018 10:37 AM  Temp 36.6 C 11/10/2018 10:37 AM  Pulse 71 11/10/2018 10:45 AM  Resp 14 11/10/2018 10:45 AM  SpO2 98 % 11/10/2018 10:45 AM  Vitals shown include unvalidated device data.  Last Pain:  Vitals:   11/10/18 1037  TempSrc:   PainSc: Asleep         Complications: No apparent anesthesia complications

## 2018-11-11 ENCOUNTER — Encounter (HOSPITAL_COMMUNITY): Payer: Self-pay | Admitting: Radiology

## 2018-11-11 NOTE — Anesthesia Postprocedure Evaluation (Signed)
Anesthesia Post Note  Patient: Caroline Gonzales  Procedure(s) Performed: MRI WITH ANESTHESIA OF BRAIN AND ORBITS WITH AND WITHOUT CONTRAST (N/A )     Patient location during evaluation: PACU Anesthesia Type: General Level of consciousness: awake and alert Pain management: pain level controlled Vital Signs Assessment: post-procedure vital signs reviewed and stable Respiratory status: spontaneous breathing, nonlabored ventilation, respiratory function stable and patient connected to nasal cannula oxygen Cardiovascular status: blood pressure returned to baseline and stable Postop Assessment: no apparent nausea or vomiting Anesthetic complications: no    Last Vitals:  Vitals:   11/10/18 1050 11/10/18 1104  BP: 117/79 116/70  Pulse: 71 72  Resp: 20 17  Temp:  36.6 C  SpO2: 97% 98%    Last Pain:  Vitals:   11/10/18 1037  TempSrc:   PainSc: Asleep                 Chelsey L Woodrum

## 2018-11-14 DIAGNOSIS — F4310 Post-traumatic stress disorder, unspecified: Secondary | ICD-10-CM | POA: Diagnosis not present

## 2018-11-17 MED FILL — MECLIZINE 25 MG TABLET: 25 | 20 days supply | Qty: 60 | Fill #1

## 2018-11-18 MED FILL — Propofol IV Emul 200 MG/20ML (10 MG/ML): INTRAVENOUS | Qty: 20 | Status: AC

## 2018-11-18 MED FILL — Fentanyl Citrate Preservative Free (PF) Inj 100 MCG/2ML: INTRAMUSCULAR | Qty: 2 | Status: AC

## 2018-11-18 MED FILL — Lactated Ringer's Solution: INTRAVENOUS | Qty: 1000 | Status: AC

## 2018-11-18 MED FILL — Ondansetron HCl Inj 4 MG/2ML (2 MG/ML): INTRAMUSCULAR | Qty: 2 | Status: AC

## 2018-11-18 MED FILL — Midazolam HCl Inj 2 MG/2ML (Base Equivalent): INTRAMUSCULAR | Qty: 4 | Status: AC

## 2018-11-18 MED FILL — Phenylephrine-NaCl Pref Syr 0.4 MG/10ML-0.9% (40 MCG/ML): INTRAVENOUS | Qty: 10 | Status: AC

## 2018-11-18 MED FILL — Lidocaine HCl(Cardiac) IV PF Soln Pref Syr 100 MG/5ML (2%): INTRAVENOUS | Qty: 5 | Status: AC

## 2018-11-18 MED FILL — Succinylcholine Chloride Sol Pref Syr 200 MG/10ML (20 MG/ML): INTRAVENOUS | Qty: 10 | Status: AC

## 2018-11-18 MED FILL — Dexamethasone Sodium Phosphate Inj 10 MG/ML: INTRAMUSCULAR | Qty: 1 | Status: AC

## 2018-11-18 MED FILL — Rocuronium Bromide IV Soln 50 MG/5ML (10 MG/ML): INTRAVENOUS | Qty: 5 | Status: AC

## 2018-11-22 ENCOUNTER — Telehealth: Payer: 59 | Admitting: Physician Assistant

## 2018-11-22 DIAGNOSIS — R300 Dysuria: Secondary | ICD-10-CM

## 2018-11-22 MED ORDER — NITROFURANTOIN MONOHYD MACRO 100 MG PO CAPS: 100.0000 mg | ORAL_CAPSULE | Freq: Two times a day (BID) | ORAL | 0 refills | Status: AC

## 2018-11-22 MED FILL — NITROFURANTOIN MONO-MCR 100: 100 | 5 days supply | Qty: 10 | Fill #0

## 2018-11-22 NOTE — Progress Notes (Signed)
We are sorry that you are not feeling well.  Here is how we plan to help!  Based on what you shared with me it looks like you most likely have a simple urinary tract infection.  A UTI (Urinary Tract Infection) is a bacterial infection of the bladder.  Most cases of urinary tract infections are simple to treat but a key part of your care is to encourage you to drink plenty of fluids and watch your symptoms carefully.  I have prescribed MacroBid 100 mg twice a day for 5 days.  Your symptoms should gradually improve. Call us if the burning in your urine worsens, you develop worsening fever, back pain or pelvic pain or if your symptoms do not resolve after completing the antibiotic.  Urinary tract infections can be prevented by drinking plenty of water to keep your body hydrated.  Also be sure when you wipe, wipe from front to back and don't hold it in!  If possible, empty your bladder every 4 hours.  Your e-visit answers were reviewed by a board certified advanced clinical practitioner to complete your personal care plan.  Depending on the condition, your plan could have included both over the counter or prescription medications.  If there is a problem please reply  once you have received a response from your provider.  Your safety is important to [name].  If you have drug allergies check your prescription carefully.    You can use MyChart to ask questions about today's visit, request a non-urgent call back, or ask for a work or school excuse for 24 hours related to this e-Visit. If it has been greater than 24 hours you will need to follow up with your provider, or enter a new e-Visit to address those concerns.   You will get an e-mail in the next two days asking about your experience.  I hope that your e-visit has been valuable and will speed your recovery. Thank you for using e-visits.   A total of 5-10 minutes was spent evaluating this patients questionnaire and formulating a plan of care.

## 2018-11-23 ENCOUNTER — Ambulatory Visit (INDEPENDENT_AMBULATORY_CARE_PROVIDER_SITE_OTHER): Payer: Self-pay | Admitting: Family Medicine

## 2018-11-28 DIAGNOSIS — F4310 Post-traumatic stress disorder, unspecified: Secondary | ICD-10-CM | POA: Diagnosis not present

## 2018-11-29 ENCOUNTER — Encounter (INDEPENDENT_AMBULATORY_CARE_PROVIDER_SITE_OTHER): Payer: Self-pay | Admitting: Family Medicine

## 2018-11-30 ENCOUNTER — Ambulatory Visit: Payer: 59 | Admitting: Family Medicine

## 2018-11-30 ENCOUNTER — Other Ambulatory Visit: Payer: Self-pay | Admitting: Physician Assistant

## 2018-11-30 ENCOUNTER — Telehealth: Payer: Self-pay | Admitting: Family Medicine

## 2018-11-30 ENCOUNTER — Encounter: Payer: Self-pay | Admitting: Family Medicine

## 2018-11-30 ENCOUNTER — Other Ambulatory Visit (INDEPENDENT_AMBULATORY_CARE_PROVIDER_SITE_OTHER): Payer: Self-pay | Admitting: Family Medicine

## 2018-11-30 DIAGNOSIS — B370 Candidal stomatitis: Secondary | ICD-10-CM

## 2018-11-30 DIAGNOSIS — E8881 Metabolic syndrome: Secondary | ICD-10-CM

## 2018-11-30 MED ORDER — NYSTATIN 100000 UNIT/ML MT SUSP: 5.0000 mL | Freq: Four times a day (QID) | OROMUCOSAL | 0 refills | Status: AC

## 2018-11-30 MED FILL — PANTOPRAZOLE SOD DR 40 MG T: 40 | 90 days supply | Qty: 90 | Fill #1

## 2018-11-30 MED FILL — NYSTATIN 100,000 UNITS/ML S: 100000 | 10 days supply | Qty: 200 | Fill #0

## 2018-11-30 MED FILL — lamoTRIgine 150 MG TABS: 150 | 90 days supply | Qty: 180 | Fill #0

## 2018-11-30 MED FILL — PROPRANOLOL 40 MG TABLET: 40 | 90 days supply | Qty: 180 | Fill #0

## 2018-11-30 NOTE — Telephone Encounter (Signed)
No, I'd prefer not to do 3 months on Klonopin.

## 2018-11-30 NOTE — Telephone Encounter (Signed)
Pharmacy called and rx verified.

## 2018-11-30 NOTE — Telephone Encounter (Signed)
Copied from [place] [phone]. Topic: General - Other >> Nov 30, 2018  4:55 PM Lennox Solders wrote: Reason for CRM: Caroline Gonzales pharm cone out pt pharm is calling the directions for nystatin and  60 ml for 10 day supply is not adding up. Please verify and call pharm. Pt is waiting at [facility]

## 2018-12-01 MED FILL — clonazePAM 0.5 MG TABS: 0.5 | 30 days supply | Qty: 120 | Fill #0 | Status: TO

## 2018-12-02 ENCOUNTER — Encounter: Payer: Self-pay | Admitting: Family Medicine

## 2018-12-02 ENCOUNTER — Ambulatory Visit: Payer: 59 | Admitting: Physician Assistant

## 2018-12-02 MED FILL — PRAZOSIN 2 MG CAPSULE: 2 | 90 days supply | Qty: 180 | Fill #0

## 2018-12-02 NOTE — Telephone Encounter (Signed)
Do I need to do anything else with this?

## 2018-12-04 MED ORDER — MECLIZINE HCL 25 MG PO TABS: 25.0000 mg | ORAL_TABLET | Freq: Three times a day (TID) | ORAL | 1 refills | Status: DC | PRN

## 2018-12-05 MED FILL — MECLIZINE 25 MG TABLET: 25 | 20 days supply | Qty: 60 | Fill #0

## 2018-12-06 ENCOUNTER — Ambulatory Visit (INDEPENDENT_AMBULATORY_CARE_PROVIDER_SITE_OTHER): Payer: 59 | Admitting: Family Medicine

## 2018-12-06 ENCOUNTER — Encounter (INDEPENDENT_AMBULATORY_CARE_PROVIDER_SITE_OTHER): Payer: Self-pay | Admitting: Family Medicine

## 2018-12-06 ENCOUNTER — Encounter (INDEPENDENT_AMBULATORY_CARE_PROVIDER_SITE_OTHER): Payer: Self-pay

## 2018-12-06 ENCOUNTER — Other Ambulatory Visit: Payer: Self-pay

## 2018-12-06 DIAGNOSIS — E8881 Metabolic syndrome: Secondary | ICD-10-CM

## 2018-12-06 DIAGNOSIS — F418 Other specified anxiety disorders: Secondary | ICD-10-CM

## 2018-12-06 DIAGNOSIS — Z6841 Body Mass Index (BMI) 40.0 and over, adult: Secondary | ICD-10-CM

## 2018-12-06 MED ORDER — METFORMIN HCL 1000 MG PO TABS: 1000.0000 mg | ORAL_TABLET | Freq: Every day | ORAL | 0 refills | Status: DC

## 2018-12-07 ENCOUNTER — Ambulatory Visit (INDEPENDENT_AMBULATORY_CARE_PROVIDER_SITE_OTHER): Payer: 59 | Admitting: Physician Assistant

## 2018-12-07 ENCOUNTER — Other Ambulatory Visit: Payer: Self-pay

## 2018-12-07 ENCOUNTER — Encounter: Payer: Self-pay | Admitting: Physician Assistant

## 2018-12-07 DIAGNOSIS — F331 Major depressive disorder, recurrent, moderate: Secondary | ICD-10-CM

## 2018-12-07 DIAGNOSIS — F411 Generalized anxiety disorder: Secondary | ICD-10-CM

## 2018-12-07 DIAGNOSIS — F4310 Post-traumatic stress disorder, unspecified: Secondary | ICD-10-CM | POA: Diagnosis not present

## 2018-12-07 DIAGNOSIS — G4700 Insomnia, unspecified: Secondary | ICD-10-CM | POA: Diagnosis not present

## 2018-12-07 DIAGNOSIS — G4733 Obstructive sleep apnea (adult) (pediatric): Secondary | ICD-10-CM | POA: Diagnosis not present

## 2018-12-07 MED ORDER — PRAZOSIN HCL 1 MG PO CAPS: 1.0000 mg | ORAL_CAPSULE | Freq: Every day | ORAL | 0 refills | Status: DC

## 2018-12-07 MED FILL — PRAZOSIN 1 MG CAPSULE: 1 | 90 days supply | Qty: 90 | Fill #0

## 2018-12-07 NOTE — Progress Notes (Signed)
[facility] Med Check  Patient ID: MCKENLEIGH TARLTON,  MRN: [id number]  PCP: Ronnald Nian, DO  Date of Evaluation: 12/07/2018 Time spent:15 minutes  Chief Complaint:  Chief Complaint    Follow-up     Virtual Visit via Telephone Note  I connected with Caroline Gonzales on 12/07/18 at  1:30 PM EDT by telephone and verified that I am speaking with the correct person using two identifiers.   I discussed the limitations, risks, security and privacy concerns of performing an evaluation and management service by telephone and the availability of in person appointments. I also discussed with the patient that there may be a patient responsible charge related to this service. The patient expressed understanding and agreed to proceed.   HISTORY/CURRENT STATUS: HPI for 1 month med check.  At the last visit, no changes were made in her medications.  States she is doing well right now except for anxiety due to the coronavirus.  Her son is 54 years old and is out of school for 2 months because of school shutting down.  She worries about him being at home alone as much as he is.  He is also doing online classes which is difficult.  Her husband may be laid off and that is causing some anxiety as well.  Patient states she knows why she is anxious though and is talking to her therapist about it.  "I know it is situational right now."  She has been having more frequent nightmares.  They are happening about once a week now and are very scary when they do happen.  She sleeps fairly good most of the time.  Patient denies loss of interest in usual activities and is able to enjoy things.  Denies decreased energy or motivation.  Appetite has not changed.  No extreme sadness, tearfulness, or feelings of hopelessness.  Denies any changes in concentration, making decisions or remembering things.  Denies suicidal or homicidal thoughts.  Denies muscle or joint pain, stiffness, or dystonia.  Denies  dizziness, syncope, seizures, numbness, tingling, tremor, tics, unsteady gait, slurred speech, confusion.   Individual Medical History/ Review of Systems: Changes? :No  Her MRI of brain was nl. Dizziness is getting better. She's very happy about that!  Past medications for mental health diagnoses include: Prozac, Paxil, Effexor, Norpramin, Wellbutrin, doxepin, Elavil, Geodon, Abilify, Lexapro, Trileptal, Ambien, Lunesta, Sonata, trazodone, Restoril, Remeron, Seroquel, Klonopin, propranolol, Lamictal, Deplin, Sonata  Allergies: Penicillins; Tamiflu [oseltamivir phosphate]; Cephalosporins; and Latex  Current Medications:  Current Outpatient Medications:  .  Cholecalciferol (VITAMIN D3) 5000 units CAPS, Take 5,000 Units by mouth at bedtime., Disp: , Rfl:  .  clonazePAM (KLONOPIN) 0.5 MG tablet, TAKE 1 TABLET BY MOUTH EVERY 6 HOURS AS NEEDED FOR ANXIETY., Disp: 120 tablet, Rfl: 2 .  fexofenadine (ALLEGRA) 180 MG tablet, Take 180 mg by mouth at bedtime., Disp: , Rfl:  .  ibuprofen (ADVIL,MOTRIN) 200 MG tablet, Take 400 mg by mouth daily as needed for headache or moderate pain., Disp: , Rfl:  .  L-Methylfolate-Algae (DEPLIN 15) 15-90.314 MG CAPS, Take 1 capsule by mouth at bedtime. , Disp: , Rfl:  .  lamoTRIgine (LAMICTAL) 150 MG tablet, TAKE 1 TABLET BY MOUTH TWICE DAILY, Disp: 180 tablet, Rfl: 0 .  levonorgestrel (MIRENA) 20 MCG/24HR IUD, 1 each by Intrauterine route once. , Disp: , Rfl:  .  meclizine (ANTIVERT) 25 MG tablet, Take 1 tablet (25 mg total) by mouth 3 (three) times daily as needed for dizziness., Disp: 60  tablet, Rfl: 1 .  metFORMIN (GLUCOPHAGE) 1000 MG tablet, Take 1 tablet (1,000 mg total) by mouth daily with breakfast., Disp: 30 tablet, Rfl: 0 .  Multiple Vitamin (MULTIVITAMIN) tablet, Take 1 tablet by mouth at bedtime. , Disp: , Rfl:  .  nystatin (MYCOSTATIN) 100000 UNIT/ML suspension, Take 5 mLs (500,000 Units total) by mouth 4 (four) times daily for 10 days., Disp: 60 mL, Rfl:  0 .  pantoprazole (PROTONIX) 40 MG tablet, Take 40 mg by mouth at bedtime. , Disp: , Rfl:  .  prazosin (MINIPRESS) 2 MG capsule, TAKE 2 CAPSULES BY MOUTH AT BEDTIME., Disp: 180 capsule, Rfl: 0 .  propranolol (INDERAL) 40 MG tablet, Take 1 tablet (40 mg total) by mouth 2 (two) times daily., Disp: 180 tablet, Rfl: 0 .  QUEtiapine (SEROQUEL) 200 MG tablet, TAKE 1 TABLET BY MOUTH AT BEDTIME, Disp: 90 tablet, Rfl: 0 .  diazepam (VALIUM) 5 MG tablet, 1-2 po about 1 30-60 minutes pre-MRI. (Patient not taking: Reported on 11/09/2018), Disp: 2 tablet, Rfl: 0 .  prazosin (MINIPRESS) 1 MG capsule, Take 1 capsule (1 mg total) by mouth at bedtime. Add to the 4 mg you're already taking to =5 mg., Disp: 90 capsule, Rfl: 0 Medication Side Effects: none  Family Medical/ Social History: Changes? Yes see HPI  MENTAL HEALTH EXAM:  There were no vitals taken for this visit.There is no height or weight on file to calculate BMI.  General Appearance: Phone visit and unable to assess her appearance  Eye Contact:  Unable to assess  Speech:  Clear and Coherent  Volume:  Normal  Mood:  Euthymic  Affect:  Appropriate  Thought Process:  Goal Directed  Orientation:  Full (Time, Place, and Person)  Thought Content: Logical   Suicidal Thoughts:  No  Homicidal Thoughts:  No  Memory:  WNL  Judgement:  Good  Insight:  Good  Psychomotor Activity:  Unable to assess  Concentration:  Concentration: Good  Recall:  Good  Fund of Knowledge: Good  Language: Good  Assets:  Desire for Improvement  ADL's:  Intact  Cognition: WNL  Prognosis:  Good    DIAGNOSES:    ICD-10-CM   1. PTSD (post-traumatic stress disorder) F43.10   2. Generalized anxiety disorder F41.1   3. Major depressive disorder, recurrent episode, moderate (HCC) F33.1   4. Obstructive sleep apnea G47.33   5. Insomnia, unspecified type G47.00     Receiving Psychotherapy: Yes    RECOMMENDATIONS: Recommend increase prazosin to 5 mg.  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  She will let me know if that happens.  She already has 2 mg pills so we will add a 1 mg.  She understands that the total dose should be 5 mg chest. Continue Seroquel 200 mg p.o. nightly. Continue propranolol 40 mg twice daily. Continue Deplin 15 mg nightly. Continue Lamictal 150 mg nightly. Continue Klonopin 0.5 mg 1 every 6 hours as needed. Continue psychotherapy. Return in 6 weeks.  Donnal Moat, PA-C   This record has been created using [employer].  Chart creation errors have been sought, but may not always have been located and corrected. Such creation errors do not reflect on the standard of medical care.

## 2018-12-07 NOTE — Progress Notes (Signed)
Office: [phone]  /  Fax: [phone] TeleHealth Visit:  Caroline Gonzales has consented to this TeleHealth visit today via telephone call. The patient is located at home, the provider is located at the [employer] [facility] office. The participants in this visit include the listed provider and patient and provider's assistant. Time spent on visit was 20 minutes  HPI:   Chief Complaint: OBESITY Caroline Gonzales is here to discuss her progress with her obesity treatment plan. She is on the portion control better and make smarter food choices, such as increase vegetables and decrease simple carbohydrates and is following her eating plan approximately 10 % of the time. She states she is walking for 25 minutes 5 times per week. 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  We were unable to weight the patient today for this TeleHealth visit. She feels as if she has gained weight since her last visit, but unsure how much. She has lost 15 lbs since starting treatment with [name].  Pre-Diabetes Caroline Gonzales has a diagnosis of pre-diabetes based on her elevated Hgb A1c of 5.7 two years ago, but has gotten back to within normal limits secondary to medication. She was informed this puts her at greater risk of developing diabetes. She is taking metformin currently and notes stress eating of carbohydrates. She continues to work on diet and exercise to decrease risk of diabetes. She denies nausea or hypoglycemia.  Depression with Anxiety Caroline Gonzales is struggling with depression and anxiety. She notes it is hard to tell if her symptoms are being controlled. She plans to see a Sales executive. She shows no sign of suicidal or homicidal ideations.  ASSESSMENT AND PLAN:  Insulin resistance - Plan: metFORMIN (GLUCOPHAGE) 1000 MG tablet  Depression with anxiety  Class 3 severe obesity with serious comorbidity and body mass index (BMI) of 40.0 to 44.9 in adult, unspecified obesity type [facility])  PLAN:  Pre-Diabetes Caroline Gonzales will continue to work on weight loss, exercise, and decreasing simple carbohydrates in her diet to help decrease the risk of diabetes. We dicussed metformin including benefits and risks. She was informed that eating too many simple carbohydrates or too many calories at one sitting increases the likelihood of GI side effects. Caroline Gonzales agrees to continue taking metformin 1,000 mg PO daily #30 and we will refill for 1 month. Caroline Gonzales agrees to follow up with our clinic in 2 weeks as directed to monitor her progress.  Depression with Anxiety We discussed behavior modification techniques today to help Caroline Gonzales deal with her depression and anxiety. Caroline Gonzales is to follow up with Psychiatrist tomorrow, and she agrees to follow up with our clinic in 2 weeks.  Obesity Caroline Gonzales is currently in the action stage of change. As such, her goal is to continue with weight loss efforts She has agreed to portion control better and make smarter food choices, such as increase vegetables and decrease simple carbohydrates  Caroline Gonzales has been instructed to work up to a goal of 150 minutes of combined cardio and strengthening exercise per week for weight loss and overall health benefits. We discussed the following Behavioral Modification Strategies today: increasing lean protein intake, increasing vegetables and work on meal planning and easy cooking plans, and planning for success   Caroline Gonzales has agreed to follow up with our clinic in 2 weeks. She was informed of  the importance of frequent follow up visits to maximize her success with intensive lifestyle modifications for her multiple health conditions.  ALLERGIES: Allergies  Allergen Reactions  . Penicillins Shortness Of Breath and Rash    Did it involve swelling of the face/tongue/throat, SOB, or low BP? Yes Did it  involve sudden or severe rash/hives, skin peeling, or any reaction on the inside of your mouth or nose? Yes Did you need to seek medical attention at a hospital or doctor's office? Yes When did it last happen?childhood allergy If all above answers are "NO", may proceed with cephalosporin use.    . Tamiflu [Oseltamivir Phosphate] Anaphylaxis  . Cephalosporins Rash  . Latex Rash    MEDICATIONS: Current Outpatient Medications on File Prior to Visit  Medication Sig Dispense Refill  . Cholecalciferol (VITAMIN D3) 5000 units CAPS Take 5,000 Units by mouth at bedtime.    . clonazePAM (KLONOPIN) 0.5 MG tablet TAKE 1 TABLET BY MOUTH EVERY 6 HOURS AS NEEDED FOR ANXIETY. 120 tablet 2  . diazepam (VALIUM) 5 MG tablet 1-2 po about 1 30-60 minutes pre-MRI. (Patient not taking: Reported on 11/09/2018) 2 tablet 0  . fexofenadine (ALLEGRA) 180 MG tablet Take 180 mg by mouth at bedtime.    [name] ibuprofen (ADVIL,MOTRIN) 200 MG tablet Take 400 mg by mouth daily as needed for headache or moderate pain.    [name] L-Methylfolate-Algae (DEPLIN 15) 15-90.314 MG CAPS Take 1 capsule by mouth at bedtime.     . lamoTRIgine (LAMICTAL) 150 MG tablet TAKE 1 TABLET BY MOUTH TWICE DAILY 180 tablet 0  . levonorgestrel (MIRENA) 20 MCG/24HR IUD 1 each by Intrauterine route once.     . meclizine (ANTIVERT) 25 MG tablet Take 1 tablet (25 mg total) by mouth 3 (three) times daily as needed for dizziness. 60 tablet 1  . Multiple Vitamin (MULTIVITAMIN) tablet Take 1 tablet by mouth at bedtime.     [name] nystatin (MYCOSTATIN) 100000 UNIT/ML suspension Take 5 mLs (500,000 Units total) by mouth 4 (four) times daily for 10 days. 60 mL 0  . pantoprazole (PROTONIX) 40 MG tablet Take 40 mg by mouth at bedtime.     . prazosin (MINIPRESS) 2 MG capsule TAKE 2 CAPSULES BY MOUTH AT BEDTIME. 180 capsule 0  . propranolol (INDERAL) 40 MG tablet Take 1 tablet (40 mg total) by mouth 2 (two) times daily. 180 tablet 0  . QUEtiapine (SEROQUEL) 200 MG tablet  TAKE 1 TABLET BY MOUTH AT BEDTIME 90 tablet 0   No current facility-administered medications on file prior to visit.     PAST MEDICAL HISTORY: Past Medical History:  Diagnosis Date  . Allergy    Zyrtec, Allegra.  . Anxiety    followed by Dr. Toy Cookey  . Back pain   . Bipolar 1 disorder ([place])   . Complication of anesthesia   . Depression   . Fibroid   . Gallbladder problem   . Gastroparesis 09/14/2012   gastric emptying study in 2014  . GERD (gastroesophageal reflux disease)   . Headache   . Pneumonia    2013ish  . PONV (postoperative nausea and vomiting)   . Pre-diabetes   . PTSD (post-traumatic stress disorder)   . Sleep apnea    do not use a cpap  . Tinnitus   . Vitamin D deficiency   . Wears glasses     PAST SURGICAL HISTORY: Past Surgical History:  Procedure Laterality Date  . ANKLE ARTHROSCOPY Left 2011  . CESAREAN SECTION    .  CHOLECYSTECTOMY    . DILATION AND CURETTAGE OF UTERUS    . FOREIGN BODY REMOVAL Left 11/18/2016   Procedure: REMOVAL FOREIGN BODY EXTREMITY LEFT FOOT;  Surgeon: Trula Slade, DPM;  Location: [facility];  Service: Podiatry;  Laterality: Left;  . PILONIDAL CYST EXCISION  1990's  . RADIOLOGY WITH ANESTHESIA N/A 11/10/2018   Procedure: MRI WITH ANESTHESIA OF BRAIN AND ORBITS WITH AND WITHOUT CONTRAST;  Surgeon: Radiologist, Medication, MD;  Location: [facility];  Service: Radiology;  Laterality: N/A;  . TENDON REPAIR Left 2011   Left Ankle  . WISDOM TOOTH EXTRACTION  19090's    SOCIAL HISTORY: Social History   Tobacco Use  . Smoking status: Former Smoker    Years: 10.00    Types: Cigarettes    Last attempt to quit: 2003    Years since quitting: 17.2  . Smokeless tobacco: Never Used  Substance Use Topics  . Alcohol use: Yes    Alcohol/week: 0.0 standard drinks    Comment: rare  . Drug use: No    FAMILY HISTORY: Family History  Problem Relation Age of Onset  . Cancer Mother        squamous cell carcinoma  . Hyperlipidemia Mother   .  Depression Mother   . Anxiety disorder Mother   . Obesity Mother   . Heart disease Father 25       cardiomegaly, CHF; steroid use.  [name] Hyperlipidemia Father   . Hypertension Father   . Mental retardation Father   . High blood pressure Father   . Depression Father   . Anxiety disorder Father   . Obesity Father   . Cancer Maternal Grandmother   . Diabetes Maternal Grandmother   . Heart disease Maternal Grandmother   . Hyperlipidemia Maternal Grandmother   . Hypertension Maternal Grandmother   . Stroke Maternal Grandmother   . Heart disease Paternal Grandmother   . Hypertension Paternal Grandmother   . Heart disease Paternal Grandfather   . Hyperlipidemia Paternal Grandfather   . Mental illness Paternal Grandfather   . Rheum arthritis Sister   . Breast cancer Maternal Aunt   . Thyroid cancer Paternal Aunt     ROS: Review of Systems  Constitutional: Negative for weight loss.  Gastrointestinal: Negative for nausea.  Endo/Heme/Allergies:       Negative hypoglycemia  Psychiatric/Behavioral: Positive for depression. Negative for suicidal ideas.       + Anxiety    PHYSICAL EXAM: Pt in no acute distress  RECENT LABS AND TESTS: BMET    Component Value Date/Time   NA 135 11/10/2018 0657   NA 138 10/10/2018 0829   K 4.4 11/10/2018 0657   CL 106 11/10/2018 0657   CO2 21 (L) 11/10/2018 0657   GLUCOSE 94 11/10/2018 0657   BUN 13 11/10/2018 0657   BUN 12 10/10/2018 0829   CREATININE 0.94 11/10/2018 0657   CREATININE 0.95 06/27/2016 0933   CALCIUM 8.6 (L) 11/10/2018 0657   GFRNONAA >60 11/10/2018 0657   GFRAA >60 11/10/2018 0657   Lab Results  Component Value Date   HGBA1C 5.2 10/10/2018   HGBA1C 5.3 05/25/2018   HGBA1C 5.4 02/10/2018   HGBA1C 5.5 10/07/2017   HGBA1C 5.1 06/22/2017   Lab Results  Component Value Date   INSULIN 21.4 10/10/2018   INSULIN 87.4 (H) 05/25/2018   INSULIN 20.3 02/10/2018   INSULIN 15.6 10/07/2017   INSULIN 25.2 (H) 06/22/2017   CBC     Component Value Date/Time   WBC  5.5 01/05/2018 0915   RBC 4.02 01/05/2018 0915   HGB 13.3 09/12/2018 1636   HGB 11.2 06/22/2017 0904   HCT 39.0 09/12/2018 1636   HCT 34.8 06/22/2017 0904   PLT 285 01/05/2018 0915   PLT 332 11/12/2016 1651   MCV 88.1 01/05/2018 0915   MCV 86 06/22/2017 0904   MCH 27.6 01/05/2018 0915   MCHC 31.4 01/05/2018 0915   RDW 14.4 01/05/2018 0915   RDW 15.7 (H) 06/22/2017 0904   LYMPHSABS 1.5 06/22/2017 0904   MONOABS 891 04/30/2016 0947   EOSABS 0.2 06/22/2017 0904   BASOSABS 0.0 06/22/2017 0904   Iron/TIBC/Ferritin/ %Sat    Component Value Date/Time   IRON 53 08/31/2016 1644   TIBC 280 08/31/2016 1644   FERRITIN 36 08/31/2016 1644   IRONPCTSAT 19 08/31/2016 1644   Lipid Panel     Component Value Date/Time   CHOL 155 10/10/2018 0829   TRIG 173 (H) 10/10/2018 0829   HDL 40 10/10/2018 0829   CHOLHDL 3.5 02/10/2018 0749   CHOLHDL 4.1 12/01/2015 0914   VLDL 37 (H) 12/01/2015 0914   LDLCALC 80 10/10/2018 0829   Hepatic Function Panel     Component Value Date/Time   PROT 6.9 10/10/2018 0829   ALBUMIN 3.9 10/10/2018 0829   AST 20 10/10/2018 0829   ALT 18 10/10/2018 0829   ALKPHOS 84 10/10/2018 0829   BILITOT <0.2 10/10/2018 0829      Component Value Date/Time   TSH 1.620 08/31/2016 1644   TSH 1.83 12/01/2015 0914      I, Trixie Dredge, am acting as transcriptionist for Ilene Qua, MD  I have reviewed the above documentation for accuracy and completeness, and I agree with the above. - Ilene Qua, MD

## 2018-12-12 DIAGNOSIS — F4310 Post-traumatic stress disorder, unspecified: Secondary | ICD-10-CM | POA: Diagnosis not present

## 2018-12-14 ENCOUNTER — Encounter: Payer: Self-pay | Admitting: Family Medicine

## 2018-12-15 MED ORDER — ALBUTEROL SULFATE HFA 108 (90 BASE) MCG/ACT IN AERS: 2.0000 | INHALATION_SPRAY | RESPIRATORY_TRACT | 1 refills | Status: DC | PRN

## 2018-12-19 ENCOUNTER — Emergency Department (HOSPITAL_COMMUNITY)
Admission: EM | Admit: 2018-12-19 | Discharge: 2018-12-19 | Disposition: A | Discharge status: Home / Self Care | Payer: 59 | Attending: Emergency Medicine | Admitting: Emergency Medicine

## 2018-12-19 ENCOUNTER — Other Ambulatory Visit: Payer: Self-pay

## 2018-12-19 ENCOUNTER — Encounter (HOSPITAL_COMMUNITY): Payer: Self-pay

## 2018-12-19 DIAGNOSIS — Z91040 Latex allergy status: Secondary | ICD-10-CM | POA: Diagnosis not present

## 2018-12-19 DIAGNOSIS — Z87891 Personal history of nicotine dependence: Secondary | ICD-10-CM | POA: Diagnosis not present

## 2018-12-19 DIAGNOSIS — Z79899 Other long term (current) drug therapy: Secondary | ICD-10-CM | POA: Insufficient documentation

## 2018-12-19 DIAGNOSIS — J069 Acute upper respiratory infection, unspecified: Secondary | ICD-10-CM | POA: Diagnosis not present

## 2018-12-19 DIAGNOSIS — R0602 Shortness of breath: Secondary | ICD-10-CM | POA: Diagnosis present

## 2018-12-19 NOTE — Discharge Instructions (Addendum)
Continue with Tylenol for fevers as directed. Be sure to drink plenty of hydrating fluids (G2).

## 2018-12-19 NOTE — ED Triage Notes (Addendum)
Pt states [facility] sent her home on Wednesday. Pt has had a productive cough, SHOB, fever. Last use of albuterol inhaler at 1130. Pt took tylenol at that time as well. 101.5 after tylenol last night. Patient is a palliative care RN

## 2018-12-19 NOTE — Telephone Encounter (Signed)
Please call Justise as soon as possible. 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 She is having issues now with not being able to talk without coughing. She needs Dr Bryan Lemma to call her as soon as possible this is progressing. [phone]

## 2018-12-19 NOTE — ED Provider Notes (Signed)
[facility] DEPT Provider Note   CSN: [id number] Arrival date & time: 12/19/18  1307    History   Chief Complaint Chief Complaint  Patient presents with  . Shortness of Breath  . Cough  . Fever    HPI Caroline Gonzales is a 43 y.o. female.     43 year old female presents with complaint of fevers, body aches, cough and shortness of breath.  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  Patient states her symptoms started on Tuesday of last week with mild nonproductive cough and temperature of 99.8.  Patient states her symptoms of gotten progressively worse over the past week, reports temperature today of 101.5 after taking Tylenol.  Patient has been using home pulse ox states her O2 at home was 90%.  Patient is a non-smoker, no history of asthma, called PCP who called in an albuterol inhaler which patient has been using.  Patient denies wheezing.  Reports feeling anxious secondary to feeling short of breath.  No other complaints or concerns.  Caroline Gonzales was evaluated in Emergency Department on 12/19/2018 for the symptoms described in the history of present illness. She was evaluated in the context of the global COVID-19 pandemic, which necessitated consideration that the patient might be at risk for infection with the SARS-CoV-2 virus that causes COVID-19. Institutional protocols and algorithms that pertain to the evaluation of patients at risk for COVID-19 are in a state of rapid change based on information released by regulatory bodies including the CDC and federal and state organizations. These policies and algorithms were followed during the patient's care in the ED.      Past Medical History:  Diagnosis Date  . Allergy    Zyrtec, Allegra.  . Anxiety    followed by Dr. Toy Cookey  . Back pain   . Bipolar 1 disorder ([place])   . Complication of anesthesia   . Depression   . Fibroid   . Gallbladder  problem   . Gastroparesis 09/14/2012   gastric emptying study in 2014  . GERD (gastroesophageal reflux disease)   . Headache   . Pneumonia    2013ish  . PONV (postoperative nausea and vomiting)   . Pre-diabetes   . PTSD (post-traumatic stress disorder)   . Sleep apnea    do not use a cpap  . Tinnitus   . Vitamin D deficiency   . Wears glasses     Patient Active Problem List   Diagnosis Date Noted  . PTSD (post-traumatic stress disorder) 07/21/2018  . GAD (generalized anxiety disorder) 07/21/2018  . Insomnia 07/21/2018  . MDD (major depressive disorder) 07/21/2018  . Nightmares 07/21/2018  . Insulin resistance 10/21/2017  . Other hyperlipidemia 10/07/2017  . Vitamin D deficiency 10/07/2017  . Bipolar depression ([place]) 04/27/2017  . Class 3 obesity with serious comorbidity and body mass index (BMI) of 40.0 to 44.9 in adult 04/27/2017  . Residual foreign body in soft tissue 11/12/2016  . Prediabetes 11/02/2016  . Morbid obesity ([place]) 10/15/2016  . Bipolar 1 disorder [facility])     Past Surgical History:  Procedure Laterality Date  . ANKLE ARTHROSCOPY Left 2011  . CESAREAN SECTION    . CHOLECYSTECTOMY    . DILATION AND CURETTAGE OF UTERUS    . FOREIGN BODY REMOVAL Left 11/18/2016   Procedure: REMOVAL FOREIGN BODY EXTREMITY LEFT FOOT;  Surgeon: Trula Slade, DPM;  Location: [facility];  Service: Podiatry;  Laterality: Left;  .  PILONIDAL CYST EXCISION  1990's  . RADIOLOGY WITH ANESTHESIA N/A 11/10/2018   Procedure: MRI WITH ANESTHESIA OF BRAIN AND ORBITS WITH AND WITHOUT CONTRAST;  Surgeon: Radiologist, Medication, MD;  [facility];  Service: Radiology;  Laterality: N/A;  . TENDON REPAIR Left 2011   Left Ankle  . WISDOM TOOTH EXTRACTION  19090's     OB History    Gravida  3   Para  1   Term  1   Preterm  0   AB  0   Living  0     SAB  0   TAB  0   Ectopic  0   Multiple  0   Live Births  0            Home Medications    Prior to Admission  medications   Medication Sig Start Date End Date Taking? Authorizing Provider  albuterol (PROVENTIL HFA;VENTOLIN HFA) 108 (90 Base) MCG/ACT inhaler Inhale 2 puffs into the lungs every 4 (four) hours as needed for wheezing or shortness of breath. 12/15/18   Cirigliano, Garvin Fila, DO  Cholecalciferol (VITAMIN D3) 5000 units CAPS Take 5,000 Units by mouth at bedtime.    [provider]  clonazePAM (KLONOPIN) 0.5 MG tablet TAKE 1 TABLET BY MOUTH EVERY 6 HOURS AS NEEDED FOR ANXIETY. 11/30/18   Donnal Moat T, PA-C  diazepam (VALIUM) 5 MG tablet 1-2 po about 1 30-60 minutes pre-MRI. Patient not taking: Reported on 11/09/2018 10/07/18   Donnal Moat T, PA-C  fexofenadine (ALLEGRA) 180 MG tablet Take 180 mg by mouth at bedtime.    [provider]  ibuprofen (ADVIL,MOTRIN) 200 MG tablet Take 400 mg by mouth daily as needed for headache or moderate pain.    [provider]  L-Methylfolate-Algae (DEPLIN 15) 15-90.314 MG CAPS Take 1 capsule by mouth at bedtime.  05/25/18   [provider]  lamoTRIgine (LAMICTAL) 150 MG tablet TAKE 1 TABLET BY MOUTH TWICE DAILY 11/09/18   Donnal Moat T, PA-C  levonorgestrel (MIRENA) 20 MCG/24HR IUD 1 each by Intrauterine route once.     [provider]  meclizine (ANTIVERT) 25 MG tablet Take 1 tablet (25 mg total) by mouth 3 (three) times daily as needed for dizziness. 12/04/18   Ronnald Nian, DO  metFORMIN (GLUCOPHAGE) 1000 MG tablet Take 1 tablet (1,000 mg total) by mouth daily with breakfast. 12/06/18   Eber Jones, MD  Multiple Vitamin (MULTIVITAMIN) tablet Take 1 tablet by mouth at bedtime.     [provider]  pantoprazole (PROTONIX) 40 MG tablet Take 40 mg by mouth at bedtime.     [provider]  prazosin (MINIPRESS) 1 MG capsule Take 1 capsule (1 mg total) by mouth at bedtime. Add to the 4 mg you're already taking to =5 mg. 12/07/18   Donnal Moat T, PA-C  prazosin (MINIPRESS) 2 MG capsule TAKE 2  CAPSULES BY MOUTH AT BEDTIME. 11/30/18   Hurst, Dorothea Glassman, PA-C  propranolol (INDERAL) 40 MG tablet Take 1 tablet (40 mg total) by mouth 2 (two) times daily. 11/30/18   Donnal Moat T, PA-C  QUEtiapine (SEROQUEL) 200 MG tablet TAKE 1 TABLET BY MOUTH AT BEDTIME 11/30/18   Addison Lank, PA-C    Family History Family History  Problem Relation Age of Onset  . Cancer Mother        squamous cell carcinoma  . Hyperlipidemia Mother   . Depression Mother   . Anxiety disorder Mother   .  Obesity Mother   . Heart disease Father 63       cardiomegaly, CHF; steroid use.  [name] Hyperlipidemia Father   . Hypertension Father   . Mental retardation Father   . High blood pressure Father   . Depression Father   . Anxiety disorder Father   . Obesity Father   . Cancer Maternal Grandmother   . Diabetes Maternal Grandmother   . Heart disease Maternal Grandmother   . Hyperlipidemia Maternal Grandmother   . Hypertension Maternal Grandmother   . Stroke Maternal Grandmother   . Heart disease Paternal Grandmother   . Hypertension Paternal Grandmother   . Heart disease Paternal Grandfather   . Hyperlipidemia Paternal Grandfather   . Mental illness Paternal Grandfather   . Rheum arthritis Sister   . Breast cancer Maternal Aunt   . Thyroid cancer Paternal Aunt     Social History Social History   Tobacco Use  . Smoking status: Former Smoker    Years: 10.00    Types: Cigarettes    Last attempt to quit: 2003    Years since quitting: 17.2  . Smokeless tobacco: Never Used  Substance Use Topics  . Alcohol use: Yes    Alcohol/week: 0.0 standard drinks    Comment: rare  . Drug use: No     Allergies   Penicillins; Tamiflu [oseltamivir phosphate]; Cephalosporins; and Latex   Review of Systems Review of Systems  Constitutional: Positive for chills and fever.  HENT: Positive for congestion. Negative for sinus pressure, sneezing and sore throat.   Eyes: Negative for discharge and redness.   Respiratory: Positive for cough and shortness of breath. Negative for wheezing.   Gastrointestinal: Negative for nausea and vomiting.  Musculoskeletal: Positive for arthralgias and myalgias.  Skin: Negative for rash and wound.  Allergic/Immunologic: Negative for immunocompromised state.  Psychiatric/Behavioral: The patient is nervous/anxious.   All other systems reviewed and are negative.    Physical Exam Updated Vital Signs BP 108/60 (BP Location: Left Arm)   Pulse 81   Temp 98.9 F (37.2 C) (Oral)   Resp 19   Ht 5\' 5"  (1.651 m)   Wt 120.2 kg   SpO2 97%   BMI 44.10 kg/m   Physical Exam Vitals signs and nursing note reviewed.  Constitutional:      General: She is not in acute distress.    Appearance: She is well-developed. She is obese. She is not diaphoretic.  HENT:     Head: Normocephalic and atraumatic.     Mouth/Throat:     Mouth: Mucous membranes are moist.  Neck:     Musculoskeletal: Neck supple.  Cardiovascular:     Rate and Rhythm: Normal rate and regular rhythm.     Heart sounds: Normal heart sounds.  Pulmonary:     Effort: Pulmonary effort is normal.     Breath sounds: Normal breath sounds. No decreased breath sounds or wheezing.  Lymphadenopathy:     Cervical: No cervical adenopathy.  Skin:    General: Skin is warm and dry.     Findings: No erythema or rash.  Neurological:     Mental Status: She is alert and oriented to person, place, and time.  Psychiatric:        Mood and Affect: Mood is anxious.      ED Treatments / Results  Labs (all labs ordered are listed, but only abnormal results are displayed) Labs Reviewed - No data to display  EKG None  Radiology No results found.  Procedures Procedures (including critical care time)  Medications Ordered in ED Medications - No data to display   Initial Impression / Assessment and Plan / ED Course  I have reviewed the triage vital signs and the nursing notes.  Pertinent labs & imaging  results that were available during my care of the patient were reviewed by me and considered in my medical decision making (see chart for details).  Clinical Course as of Dec 19 1342  Mon Dec 18, 4784  3675 43 year old female presents with symptoms concerning for COVID-19.  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  Patient was sent home from work Wednesday last week for her symptoms, reports progressively getting worse, last had Tylenol this morning.  Patient is using albuterol inhaler without improvement in her shortness of breath feeling.  Patient is slightly tachypneic otherwise well-appearing with clear lung sounds, vital signs within normal limits and reassuring including O2 sat of 100% on room air during exam, heart rate 80, blood pressure 108/60.  Offered chest x-ray, patient declines at this time.  At this time patient does not meet testing criteria.  Patient does have on nail polish, advised her to remove her nail polish and use her pulse ox at home to monitor her O2 sats, return to ER for worsening breathing concerns or low oxygen.  Patient verbalizes understanding of discharge instructions and plan.   [LM]    Clinical Course User Index [LM] Tacy Learn, PA-C      Final Clinical Impressions(s) / ED Diagnoses   Final diagnoses:  Viral upper respiratory tract infection    ED Discharge Orders    None       Tacy Learn, PA-C 12/19/18 1344    Pattricia Boss, MD 12/23/18 1426

## 2018-12-21 ENCOUNTER — Ambulatory Visit (INDEPENDENT_AMBULATORY_CARE_PROVIDER_SITE_OTHER): Payer: 59 | Admitting: Family Medicine

## 2018-12-21 ENCOUNTER — Encounter (INDEPENDENT_AMBULATORY_CARE_PROVIDER_SITE_OTHER): Payer: Self-pay | Admitting: Family Medicine

## 2018-12-21 ENCOUNTER — Other Ambulatory Visit: Payer: Self-pay

## 2018-12-21 DIAGNOSIS — E559 Vitamin D deficiency, unspecified: Secondary | ICD-10-CM

## 2018-12-21 DIAGNOSIS — Z6841 Body Mass Index (BMI) 40.0 and over, adult: Secondary | ICD-10-CM | POA: Diagnosis not present

## 2018-12-21 DIAGNOSIS — R7303 Prediabetes: Secondary | ICD-10-CM | POA: Diagnosis not present

## 2018-12-21 DIAGNOSIS — E66813 Obesity, class 3: Secondary | ICD-10-CM

## 2018-12-21 NOTE — Progress Notes (Signed)
Office: [phone]  /  Fax: [phone] TeleHealth Visit:  Caroline Caroline Gonzales has verbally consented to this TeleHealth visit today. The patient is located at home, the provider is located at the [facility] office. The participants in this visit include the listed provider and patient and provider's assistant. The visit was conducted today via face time.  HPI:   Chief Complaint: OBESITY Caroline Caroline Gonzales is here to discuss her progress with her obesity treatment plan. She is on the portion control better and make smarter food choices, such as increase vegetables and decrease simple carbohydrates and is following her eating plan approximately 0 % of the time. She states she is exercising 0 minutes 0 times per week. [name] is currently quarantined at home secondary to COVID-19 exposure. Today is the first afebrile day. She went to the emergency department on April 6th for shortness of breath and hyperventilation. She is tolerating minimal PO, mostly comfort foods.  We were unable to weigh the patient today for this TeleHealth visit. She is unsure if she has gained or lost weight since her last visit. She has lost 15 lbs since starting treatment with [name].  Pre-Diabetes Caroline Caroline Gonzales has a diagnosis of pre-diabetes based on her elevated Hgb A1c and was informed this puts her at greater risk of developing diabetes. She is not taking metformin secondary to illness. She is tolerating mostly comfort carbohydrates. She continues to work on diet and exercise to decrease risk of diabetes. She denies nausea or hypoglycemia.  Vitamin D Deficiency Caroline Caroline Gonzales has a diagnosis of vitamin D deficiency. She is currently taking OTC Vit D (has not taken since getting ill). She denies nausea, vomiting or muscle weakness.  ASSESSMENT AND PLAN:  Prediabetes  Vitamin D deficiency  Class 3 severe obesity with serious comorbidity and body mass index (BMI) of 40.0 to 44.9 in adult, unspecified obesity type  Caroline Caroline Gonzales)  PLAN:  Pre-Diabetes Caroline Caroline Gonzales will continue to work on weight loss, exercise, and decreasing simple carbohydrates in her diet to help decrease the risk of diabetes. We dicussed metformin including benefits and risks. She was informed that eating too many simple carbohydrates or too many calories at one sitting increases the likelihood of GI side effects. Caroline Caroline Gonzales agrees to restart metformin a few days after tolerating full PO diet. Caroline Caroline Gonzales agrees to follow up with our clinic in 2 weeks as directed to monitor her progress.  Vitamin D Deficiency Caroline Caroline Gonzales was informed that low vitamin D levels contributes to fatigue and are associated with obesity, breast, and colon cancer. She will follow up for routine testing of vitamin D, at least 2-3 times per year. She was informed of the risk of over-replacement of vitamin D and agrees to not increase her dose unless she discusses this with [name] first. We will repeat labs in June. Caroline Caroline Gonzales agrees to follow up with our clinic in 2 weeks.  Obesity Caroline Caroline Gonzales is currently in the action stage of change. As such, her goal is to continue with weight loss efforts She has agreed to portion control better and make smarter food choices, such as increase vegetables and decrease simple carbohydrates  Caroline Caroline Gonzales has been instructed to work up to a goal of 150 minutes of combined cardio and strengthening exercise per week for weight loss and overall health benefits. We discussed the following Behavioral Modification Strategies today: increasing lean protein intake, increasing vegetables, work on meal planning and easy cooking plans, avoiding temptations, and planning for success   Caroline Caroline Gonzales has agreed to follow up with  our clinic in 2 weeks. She was informed of the importance of frequent follow up visits to maximize her success with intensive lifestyle modifications for her multiple health conditions.  ALLERGIES: Allergies  Allergen Reactions   Penicillins Shortness Of Breath  and Rash    Did it involve swelling of the face/tongue/throat, SOB, or low BP? Yes Did it involve sudden or severe rash/hives, skin peeling, or any reaction on the inside of your mouth or nose? Yes Did you need to seek medical attention at a Caroline Gonzales or doctor's office? Yes When did it last happen?childhood allergy If all above answers are NO, may proceed with cephalosporin use.     Tamiflu [Oseltamivir Phosphate] Anaphylaxis   Cephalosporins Rash   Latex Rash    MEDICATIONS: Current Outpatient Medications on File Prior to Visit  Medication Sig Dispense Refill   albuterol (PROVENTIL HFA;VENTOLIN HFA) 108 (90 Base) MCG/ACT inhaler Inhale 2 puffs into the lungs every 4 (four) hours as needed for wheezing or shortness of breath. 1 Inhaler 1   Cholecalciferol (VITAMIN D3) 5000 units CAPS Take 5,000 Units by mouth at bedtime.     clonazePAM (KLONOPIN) 0.5 MG tablet TAKE 1 TABLET BY MOUTH EVERY 6 HOURS AS NEEDED FOR ANXIETY. 120 tablet 2   diazepam (VALIUM) 5 MG tablet 1-2 po about 1 30-60 minutes pre-MRI. (Patient not taking: Reported on 11/09/2018) 2 tablet 0   fexofenadine (ALLEGRA) 180 MG tablet Take 180 mg by mouth at bedtime.     ibuprofen (ADVIL,MOTRIN) 200 MG tablet Take 400 mg by mouth daily as needed for headache or moderate pain.     L-Methylfolate-Algae (DEPLIN 15) 15-90.314 MG CAPS Take 1 capsule by mouth at bedtime.      lamoTRIgine (LAMICTAL) 150 MG tablet TAKE 1 TABLET BY MOUTH TWICE DAILY 180 tablet 0   levonorgestrel (MIRENA) 20 MCG/24HR IUD 1 each by Intrauterine route once.      meclizine (ANTIVERT) 25 MG tablet Take 1 tablet (25 mg total) by mouth 3 (three) times daily as needed for dizziness. 60 tablet 1   metFORMIN (GLUCOPHAGE) 1000 MG tablet Take 1 tablet (1,000 mg total) by mouth daily with breakfast. 30 tablet 0   Multiple Vitamin (MULTIVITAMIN) tablet Take 1 tablet by mouth at bedtime.      pantoprazole (PROTONIX) 40 MG tablet Take 40 mg by  mouth at bedtime.      prazosin (MINIPRESS) 1 MG capsule Take 1 capsule (1 mg total) by mouth at bedtime. Add to the 4 mg you're already taking to =5 mg. 90 capsule 0   prazosin (MINIPRESS) 2 MG capsule TAKE 2 CAPSULES BY MOUTH AT BEDTIME. 180 capsule 0   propranolol (INDERAL) 40 MG tablet Take 1 tablet (40 mg total) by mouth 2 (two) times daily. 180 tablet 0   QUEtiapine (SEROQUEL) 200 MG tablet TAKE 1 TABLET BY MOUTH AT BEDTIME 90 tablet 0   No current facility-administered medications on file prior to visit.     PAST MEDICAL HISTORY: Past Medical History:  Diagnosis Date   Allergy    Zyrtec, Allegra.   Anxiety    followed by Dr. Toy Cookey   Back pain    Bipolar 1 disorder [facility])    Complication of anesthesia    Depression    Fibroid    Gallbladder problem    Gastroparesis 09/14/2012   gastric emptying study in 2014   GERD (gastroesophageal reflux disease)    Headache    Pneumonia    2013ish   PONV (  postoperative nausea and vomiting)    Pre-diabetes    PTSD (post-traumatic stress disorder)    Sleep apnea    do not use a cpap   Tinnitus    Vitamin D deficiency    Wears glasses     PAST SURGICAL HISTORY: Past Surgical History:  Procedure Laterality Date   ANKLE ARTHROSCOPY Left 2011   CESAREAN SECTION     CHOLECYSTECTOMY     DILATION AND CURETTAGE OF UTERUS     FOREIGN BODY REMOVAL Left 11/18/2016   Procedure: REMOVAL FOREIGN BODY EXTREMITY LEFT FOOT;  Surgeon: Trula Slade, DPM;  Location: [facility];  Service: Podiatry;  Laterality: Left;   PILONIDAL CYST EXCISION  1990's   RADIOLOGY WITH ANESTHESIA N/A 11/10/2018   Procedure: MRI WITH ANESTHESIA OF BRAIN AND ORBITS WITH AND WITHOUT CONTRAST;  Surgeon: Radiologist, Medication, MD;  Location: [facility];  Service: Radiology;  Laterality: N/A;   TENDON REPAIR Left 2011   Left Ankle   WISDOM TOOTH EXTRACTION  19090's    SOCIAL HISTORY: Social History   Tobacco Use   Smoking status: Former  Smoker    Years: 10.00    Types: Cigarettes    Last attempt to quit: 2003    Years since quitting: 17.2   Smokeless tobacco: Never Used  Substance Use Topics   Alcohol use: Yes    Alcohol/week: 0.0 standard drinks    Comment: rare   Drug use: No    FAMILY HISTORY: Family History  Problem Relation Age of Onset   Cancer Mother        squamous cell carcinoma   Hyperlipidemia Mother    Depression Mother    Anxiety disorder Mother    Obesity Mother    Heart disease Father 11       cardiomegaly, CHF; steroid use.   Hyperlipidemia Father    Hypertension Father    Mental retardation Father    High blood pressure Father    Depression Father    Anxiety disorder Father    Obesity Father    Cancer Maternal Grandmother    Diabetes Maternal Grandmother    Heart disease Maternal Grandmother    Hyperlipidemia Maternal Grandmother    Hypertension Maternal Grandmother    Stroke Maternal Grandmother    Heart disease Paternal Grandmother    Hypertension Paternal Grandmother    Heart disease Paternal Grandfather    Hyperlipidemia Paternal Grandfather    Mental illness Paternal Grandfather    Rheum arthritis Sister    Breast cancer Maternal Aunt    Thyroid cancer Paternal Aunt     ROS: Review of Systems  Constitutional: Negative for weight loss.  Gastrointestinal: Negative for nausea and vomiting.  Musculoskeletal:       Negative muscle weakness  Endo/Heme/Allergies:       Negative hypoglycemia    PHYSICAL EXAM: Pt in no acute distress  RECENT LABS AND TESTS: BMET    Component Value Date/Time   NA 135 11/10/2018 0657   NA 138 10/10/2018 0829   K 4.4 11/10/2018 0657   CL 106 11/10/2018 0657   CO2 21 (L) 11/10/2018 0657   GLUCOSE 94 11/10/2018 0657   BUN 13 11/10/2018 0657   BUN 12 10/10/2018 0829   CREATININE 0.94 11/10/2018 0657   CREATININE 0.95 06/27/2016 0933   CALCIUM 8.6 (L) 11/10/2018 0657   GFRNONAA >60 11/10/2018 0657   GFRAA  >60 11/10/2018 0657   Lab Results  Component Value Date   HGBA1C 5.2 10/10/2018  HGBA1C 5.3 05/25/2018   HGBA1C 5.4 02/10/2018   HGBA1C 5.5 10/07/2017   HGBA1C 5.1 06/22/2017   Lab Results  Component Value Date   INSULIN 21.4 10/10/2018   INSULIN 87.4 (H) 05/25/2018   INSULIN 20.3 02/10/2018   INSULIN 15.6 10/07/2017   INSULIN 25.2 (H) 06/22/2017   CBC    Component Value Date/Time   WBC 5.5 01/05/2018 0915   RBC 4.02 01/05/2018 0915   HGB 13.3 09/12/2018 1636   HGB 11.2 06/22/2017 0904   HCT 39.0 09/12/2018 1636   HCT 34.8 06/22/2017 0904   PLT 285 01/05/2018 0915   PLT 332 11/12/2016 1651   MCV 88.1 01/05/2018 0915   MCV 86 06/22/2017 0904   MCH 27.6 01/05/2018 0915   MCHC 31.4 01/05/2018 0915   RDW 14.4 01/05/2018 0915   RDW 15.7 (H) 06/22/2017 0904   LYMPHSABS 1.5 06/22/2017 0904   MONOABS 891 04/30/2016 0947   EOSABS 0.2 06/22/2017 0904   BASOSABS 0.0 06/22/2017 0904   Iron/TIBC/Ferritin/ %Sat    Component Value Date/Time   IRON 53 08/31/2016 1644   TIBC 280 08/31/2016 1644   FERRITIN 36 08/31/2016 1644   IRONPCTSAT 19 08/31/2016 1644   Lipid Panel     Component Value Date/Time   CHOL 155 10/10/2018 0829   TRIG 173 (H) 10/10/2018 0829   HDL 40 10/10/2018 0829   CHOLHDL 3.5 02/10/2018 0749   CHOLHDL 4.1 12/01/2015 0914   VLDL 37 (H) 12/01/2015 0914   LDLCALC 80 10/10/2018 0829   Hepatic Function Panel     Component Value Date/Time   PROT 6.9 10/10/2018 0829   ALBUMIN 3.9 10/10/2018 0829   AST 20 10/10/2018 0829   ALT 18 10/10/2018 0829   ALKPHOS 84 10/10/2018 0829   BILITOT <0.2 10/10/2018 0829      Component Value Date/Time   TSH 1.620 08/31/2016 1644   TSH 1.83 12/01/2015 0914      I, Trixie Dredge, am acting as transcriptionist for Ilene Qua, MD  I have reviewed the above documentation for accuracy and completeness, and I agree with the above. - Ilene Qua, MD

## 2018-12-26 DIAGNOSIS — F4310 Post-traumatic stress disorder, unspecified: Secondary | ICD-10-CM | POA: Diagnosis not present

## 2018-12-29 ENCOUNTER — Encounter: Payer: Self-pay | Admitting: Family Medicine

## 2018-12-29 MED FILL — clonazePAM 0.5 MG TABS: 0.5 | 30 days supply | Qty: 120 | Fill #0

## 2019-01-04 DIAGNOSIS — F4310 Post-traumatic stress disorder, unspecified: Secondary | ICD-10-CM | POA: Diagnosis not present

## 2019-01-05 ENCOUNTER — Ambulatory Visit (INDEPENDENT_AMBULATORY_CARE_PROVIDER_SITE_OTHER): Payer: 59 | Admitting: Family Medicine

## 2019-01-05 ENCOUNTER — Encounter (INDEPENDENT_AMBULATORY_CARE_PROVIDER_SITE_OTHER): Payer: Self-pay | Admitting: Family Medicine

## 2019-01-05 ENCOUNTER — Other Ambulatory Visit: Payer: Self-pay

## 2019-01-05 DIAGNOSIS — Z6841 Body Mass Index (BMI) 40.0 and over, adult: Secondary | ICD-10-CM

## 2019-01-05 DIAGNOSIS — R7303 Prediabetes: Secondary | ICD-10-CM | POA: Diagnosis not present

## 2019-01-05 DIAGNOSIS — E559 Vitamin D deficiency, unspecified: Secondary | ICD-10-CM

## 2019-01-10 NOTE — Progress Notes (Signed)
Office: [phone]  /  Fax: [phone] TeleHealth Visit:  Caroline Gonzales has verbally consented to this TeleHealth visit today. The patient is located at home, the provider is located at the [facility] office. The participants in this visit include the listed provider and patient. The visit was conducted today via face time.  HPI:   Chief Complaint: OBESITY Caroline Gonzales is here to discuss her progress with her obesity treatment plan. She is on the portion control better and make smarter food choices, such as increase vegetables and decrease simple carbohydrates and is following her eating plan approximately 0 % of the time. She states she is exercising 0 minutes 0 times per week. Caroline Gonzales just returned to work in the past day after almost 3 weeks off, secondary to fevers and likely COVID-19. She was only tolerating comfort foods most of the time. She is having increase in familial stress because her son is having many meltdowns. She states her blood pressure is 105/65.  We were unable to weigh the patient today for this TeleHealth visit. She feels as if she has lost 2 lbs since her last visit. She has lost 15-17 lbs since starting treatment with [name].  Pre-Diabetes Caroline Gonzales has a diagnosis of pre-diabetes based on her elevated Hgb A1c and was informed this puts her at greater risk of developing diabetes. She denies GI side effects of Victoza or metformin. She notes carbohydrate cravings and continues to work on diet and exercise to decrease risk of diabetes. She denies hypoglycemia.  Vitamin D Deficiency Caroline Gonzales has a diagnosis of vitamin D deficiency. She is currently taking OTC Vit D nightly. She denies nausea, vomiting or muscle weakness.  ASSESSMENT AND PLAN:  Prediabetes  Vitamin D deficiency  Class 3 severe obesity with serious comorbidity and body mass index (BMI) of 40.0 to 44.9 in adult, unspecified obesity type Caroline Gonzales)  PLAN:  Pre-Diabetes Caroline Gonzales will continue  to work on weight loss, exercise, and decreasing simple carbohydrates in her diet to help decrease the risk of diabetes. We dicussed metformin including benefits and risks. She was informed that eating too many simple carbohydrates or too many calories at one sitting increases the likelihood of GI side effects. Caroline Gonzales agrees to continue taking her current medications when GI symptoms resolve. Caroline Gonzales agrees to follow up with our clinic in 2 weeks as directed to monitor her progress.  Vitamin D Deficiency Caroline Gonzales was informed that low vitamin D levels contributes to fatigue and are associated with obesity, breast, and colon cancer. Caroline Gonzales agrees to continue taking OTC Vit D and will follow up for routine testing of vitamin D, at least 2-3 times per year. She was informed of the risk of over-replacement of vitamin D and agrees to not increase her dose unless she discusses this with [name] first. We will recheck labs at next appointment. Caroline Gonzales agrees to follow up with our clinic in 2 weeks.  Obesity Caroline Gonzales is currently in the action stage of change. As such, her goal is to continue with weight loss efforts She has agreed to portion control better and make smarter food choices, such as increase vegetables and decrease simple carbohydrates  Caroline Gonzales has been instructed to work up to a goal of 150 minutes of combined cardio and strengthening exercise per week for weight loss and overall health benefits. We discussed the following Behavioral Modification Strategies today: increasing lean protein intake, increasing vegetables and emotional eating strategies, no skipping meals, keeping healthy foods in the home, better snacking  choices, and planning for success   Caroline Gonzales has agreed to follow up with our clinic in 2 weeks. She was informed of the importance of frequent follow up visits to maximize her success with intensive lifestyle modifications for her multiple health conditions.  ALLERGIES: Allergies   Allergen Reactions  . Penicillins Shortness Of Breath and Rash    Did it involve swelling of the face/tongue/throat, SOB, or low BP? Yes Did it involve sudden or severe rash/hives, skin peeling, or any reaction on the inside of your mouth or nose? Yes Did you need to seek medical attention at a hospital or doctor's office? Yes When did it last happen?childhood allergy If all above answers are "NO", may proceed with cephalosporin use.    . Tamiflu [Oseltamivir Phosphate] Anaphylaxis  . Cephalosporins Rash  . Latex Rash    MEDICATIONS: Current Outpatient Medications on File Prior to Visit  Medication Sig Dispense Refill  . albuterol (PROVENTIL HFA;VENTOLIN HFA) 108 (90 Base) MCG/ACT inhaler Inhale 2 puffs into the lungs every 4 (four) hours as needed for wheezing or shortness of breath. 1 Inhaler 1  . Cholecalciferol (VITAMIN D3) 5000 units CAPS Take 5,000 Units by mouth at bedtime.    . clonazePAM (KLONOPIN) 0.5 MG tablet TAKE 1 TABLET BY MOUTH EVERY 6 HOURS AS NEEDED FOR ANXIETY. 120 tablet 2  . diazepam (VALIUM) 5 MG tablet 1-2 po about 1 30-60 minutes pre-MRI. (Patient not taking: Reported on 11/09/2018) 2 tablet 0  . fexofenadine (ALLEGRA) 180 MG tablet Take 180 mg by mouth at bedtime.    [name] ibuprofen (ADVIL,MOTRIN) 200 MG tablet Take 400 mg by mouth daily as needed for headache or moderate pain.    [name] L-Methylfolate-Algae (DEPLIN 15) 15-90.314 MG CAPS Take 1 capsule by mouth at bedtime.     . lamoTRIgine (LAMICTAL) 150 MG tablet TAKE 1 TABLET BY MOUTH TWICE DAILY 180 tablet 0  . levonorgestrel (MIRENA) 20 MCG/24HR IUD 1 each by Intrauterine route once.     . meclizine (ANTIVERT) 25 MG tablet Take 1 tablet (25 mg total) by mouth 3 (three) times daily as needed for dizziness. 60 tablet 1  . metFORMIN (GLUCOPHAGE) 1000 MG tablet Take 1 tablet (1,000 mg total) by mouth daily with breakfast. 30 tablet 0  . Multiple Vitamin (MULTIVITAMIN) tablet Take 1 tablet by mouth at bedtime.     .  pantoprazole (PROTONIX) 40 MG tablet Take 40 mg by mouth at bedtime.     . prazosin (MINIPRESS) 1 MG capsule Take 1 capsule (1 mg total) by mouth at bedtime. Add to the 4 mg you're already taking to =5 mg. 90 capsule 0  . prazosin (MINIPRESS) 2 MG capsule TAKE 2 CAPSULES BY MOUTH AT BEDTIME. 180 capsule 0  . propranolol (INDERAL) 40 MG tablet Take 1 tablet (40 mg total) by mouth 2 (two) times daily. 180 tablet 0  . QUEtiapine (SEROQUEL) 200 MG tablet TAKE 1 TABLET BY MOUTH AT BEDTIME 90 tablet 0   No current facility-administered medications on file prior to visit.     PAST MEDICAL HISTORY: Past Medical History:  Diagnosis Date  . Allergy    Zyrtec, Allegra.  . Anxiety    followed by Dr. Toy Cookey  . Back pain   . Bipolar 1 disorder ([place])   . Complication of anesthesia   . Depression   . Fibroid   . Gallbladder problem   . Gastroparesis 09/14/2012   gastric emptying study in 2014  . GERD (gastroesophageal reflux disease)   .  Headache   . Pneumonia    2013ish  . PONV (postoperative nausea and vomiting)   . Pre-diabetes   . PTSD (post-traumatic stress disorder)   . Sleep apnea    do not use a cpap  . Tinnitus   . Vitamin D deficiency   . Wears glasses     PAST SURGICAL HISTORY: Past Surgical History:  Procedure Laterality Date  . ANKLE ARTHROSCOPY Left 2011  . CESAREAN SECTION    . CHOLECYSTECTOMY    . DILATION AND CURETTAGE OF UTERUS    . FOREIGN BODY REMOVAL Left 11/18/2016   Procedure: REMOVAL FOREIGN BODY EXTREMITY LEFT FOOT;  Surgeon: Trula Slade, DPM;  Location: [facility];  Service: Podiatry;  Laterality: Left;  . PILONIDAL CYST EXCISION  1990's  . RADIOLOGY WITH ANESTHESIA N/A 11/10/2018   Procedure: MRI WITH ANESTHESIA OF BRAIN AND ORBITS WITH AND WITHOUT CONTRAST;  Surgeon: Radiologist, Medication, MD;  Location: [facility];  Service: Radiology;  Laterality: N/A;  . TENDON REPAIR Left 2011   Left Ankle  . WISDOM TOOTH EXTRACTION  19090's    SOCIAL HISTORY: Social  History   Tobacco Use  . Smoking status: Former Smoker    Years: 10.00    Types: Cigarettes    Last attempt to quit: 2003    Years since quitting: 17.3  . Smokeless tobacco: Never Used  Substance Use Topics  . Alcohol use: Yes    Alcohol/week: 0.0 standard drinks    Comment: rare  . Drug use: No    FAMILY HISTORY: Family History  Problem Relation Age of Onset  . Cancer Mother        squamous cell carcinoma  . Hyperlipidemia Mother   . Depression Mother   . Anxiety disorder Mother   . Obesity Mother   . Heart disease Father 48       cardiomegaly, CHF; steroid use.  [name] Hyperlipidemia Father   . Hypertension Father   . Mental retardation Father   . High blood pressure Father   . Depression Father   . Anxiety disorder Father   . Obesity Father   . Cancer Maternal Grandmother   . Diabetes Maternal Grandmother   . Heart disease Maternal Grandmother   . Hyperlipidemia Maternal Grandmother   . Hypertension Maternal Grandmother   . Stroke Maternal Grandmother   . Heart disease Paternal Grandmother   . Hypertension Paternal Grandmother   . Heart disease Paternal Grandfather   . Hyperlipidemia Paternal Grandfather   . Mental illness Paternal Grandfather   . Rheum arthritis Sister   . Breast cancer Maternal Aunt   . Thyroid cancer Paternal Aunt     ROS: Review of Systems  Constitutional: Positive for weight loss.  Gastrointestinal: Negative for nausea and vomiting.  Musculoskeletal:       Negative muscle weakness  Endo/Heme/Allergies:       Negative hypoglycemia    PHYSICAL EXAM: Pt in no acute distress  RECENT LABS AND TESTS: BMET    Component Value Date/Time   NA 135 11/10/2018 0657   NA 138 10/10/2018 0829   K 4.4 11/10/2018 0657   CL 106 11/10/2018 0657   CO2 21 (L) 11/10/2018 0657   GLUCOSE 94 11/10/2018 0657   BUN 13 11/10/2018 0657   BUN 12 10/10/2018 0829   CREATININE 0.94 11/10/2018 0657   CREATININE 0.95 06/27/2016 0933   CALCIUM 8.6 (L)  11/10/2018 0657   GFRNONAA >60 11/10/2018 0657   GFRAA >60 11/10/2018 4166  Lab Results  Component Value Date   HGBA1C 5.2 10/10/2018   HGBA1C 5.3 05/25/2018   HGBA1C 5.4 02/10/2018   HGBA1C 5.5 10/07/2017   HGBA1C 5.1 06/22/2017   Lab Results  Component Value Date   INSULIN 21.4 10/10/2018   INSULIN 87.4 (H) 05/25/2018   INSULIN 20.3 02/10/2018   INSULIN 15.6 10/07/2017   INSULIN 25.2 (H) 06/22/2017   CBC    Component Value Date/Time   WBC 5.5 01/05/2018 0915   RBC 4.02 01/05/2018 0915   HGB 13.3 09/12/2018 1636   HGB 11.2 06/22/2017 0904   HCT 39.0 09/12/2018 1636   HCT 34.8 06/22/2017 0904   PLT 285 01/05/2018 0915   PLT 332 11/12/2016 1651   MCV 88.1 01/05/2018 0915   MCV 86 06/22/2017 0904   MCH 27.6 01/05/2018 0915   MCHC 31.4 01/05/2018 0915   RDW 14.4 01/05/2018 0915   RDW 15.7 (H) 06/22/2017 0904   LYMPHSABS 1.5 06/22/2017 0904   MONOABS 891 04/30/2016 0947   EOSABS 0.2 06/22/2017 0904   BASOSABS 0.0 06/22/2017 0904   Iron/TIBC/Ferritin/ %Sat    Component Value Date/Time   IRON 53 08/31/2016 1644   TIBC 280 08/31/2016 1644   FERRITIN 36 08/31/2016 1644   IRONPCTSAT 19 08/31/2016 1644   Lipid Panel     Component Value Date/Time   CHOL 155 10/10/2018 0829   TRIG 173 (H) 10/10/2018 0829   HDL 40 10/10/2018 0829   CHOLHDL 3.5 02/10/2018 0749   CHOLHDL 4.1 12/01/2015 0914   VLDL 37 (H) 12/01/2015 0914   LDLCALC 80 10/10/2018 0829   Hepatic Function Panel     Component Value Date/Time   PROT 6.9 10/10/2018 0829   ALBUMIN 3.9 10/10/2018 0829   AST 20 10/10/2018 0829   ALT 18 10/10/2018 0829   ALKPHOS 84 10/10/2018 0829   BILITOT <0.2 10/10/2018 0829      Component Value Date/Time   TSH 1.620 08/31/2016 1644   TSH 1.83 12/01/2015 0914      I, Trixie Dredge, am acting as transcriptionist for Ilene Qua, MD  I have reviewed the above documentation for accuracy and completeness, and I agree with the above. - Ilene Qua,  MD

## 2019-01-11 DIAGNOSIS — F4310 Post-traumatic stress disorder, unspecified: Secondary | ICD-10-CM | POA: Diagnosis not present

## 2019-01-17 ENCOUNTER — Other Ambulatory Visit: Payer: Self-pay

## 2019-01-17 ENCOUNTER — Ambulatory Visit (INDEPENDENT_AMBULATORY_CARE_PROVIDER_SITE_OTHER): Payer: 59 | Admitting: Physician Assistant

## 2019-01-17 ENCOUNTER — Encounter: Payer: Self-pay | Admitting: Physician Assistant

## 2019-01-17 DIAGNOSIS — G4700 Insomnia, unspecified: Secondary | ICD-10-CM

## 2019-01-17 DIAGNOSIS — F411 Generalized anxiety disorder: Secondary | ICD-10-CM | POA: Diagnosis not present

## 2019-01-17 DIAGNOSIS — F331 Major depressive disorder, recurrent, moderate: Secondary | ICD-10-CM | POA: Diagnosis not present

## 2019-01-17 DIAGNOSIS — F4310 Post-traumatic stress disorder, unspecified: Secondary | ICD-10-CM

## 2019-01-17 MED ORDER — ALPRAZOLAM 0.5 MG PO TABS: 0.5000 mg | ORAL_TABLET | Freq: Four times a day (QID) | ORAL | 0 refills | Status: DC | PRN

## 2019-01-17 MED FILL — QUETIAPINE FUMARATE 200 MG: 200 | 90 days supply | Qty: 90 | Fill #0

## 2019-01-17 MED FILL — ALPRAZolam 0.5 MG TABS: 0.5 | 30 days supply | Qty: 120 | Fill #0

## 2019-01-17 NOTE — Progress Notes (Signed)
[facility] Check  Patient ID: Caroline Gonzales,  MRN: [id number]  PCP: Ronnald Nian, DO  Date of Evaluation: 01/17/2019 Time spent:15 minutes  Chief Complaint:  Chief Complaint    Anxiety; Panic Attack; Follow-up     Virtual Visit via Telephone Note  I connected with patient by a video enabled telemedicine application or telephone, with their informed consent, and verified patient privacy and that I am speaking with the correct person using two identifiers.  I am private, in my home and the patient is at work.   I discussed the limitations, risks, security and privacy concerns of performing an evaluation and management service by telephone and the availability of in person appointments. I also discussed with the patient that there may be a patient responsible charge related to this service. The patient expressed understanding and agreed to proceed.   I discussed the assessment and treatment plan with the patient. The patient was provided an opportunity to ask questions and all were answered. The patient agreed with the plan and demonstrated an understanding of the instructions.   The patient was advised to call back or seek an in-person evaluation if the symptoms worsen or if the condition fails to improve as anticipated.  I provided 15 minutes of non-face-to-face time during this encounter.  HISTORY/CURRENT STATUS: HPI For routine 6 week med check.  More anxious since the last visit but unsure if it is due to circumstances or what.  She believes she had COVID 19 even though she tested negative.  She was extremely ill for about 3 weeks.  Was at home during that time, had shortness of breath and aches and fever, pretty much all the classic symptoms.  She started becoming more anxious during that time when she had more trouble breathing and have the cough.  She feels a lot better physically and is now back at work.  Still having anxiety though.  She will have panic attacks  several times a week and then racing thoughts on a daily basis.  It is much worse in the evenings after work and when she is trying to get ready for bed.  She cannot turn her mind off and sometimes will panic during her evening routine.  She has trouble going to sleep because of this.  When she has a panic attack, it can last anywhere from 15 minutes to a couple of hours.  She will have sporadic palpitations, shortness of breath and sweaty palms.  The Klonopin is not working as well as it used to.  Patient denies loss of interest in usual activities and is able to enjoy things.  Denies decreased energy or motivation.  Appetite has not changed.  No extreme sadness, tearfulness, or feelings of hopelessness.  Denies any changes in concentration, making decisions or remembering things.  Denies suicidal or homicidal thoughts.  Denies muscle or joint pain, stiffness, or dystonia. Denies dizziness, syncope, seizures, numbness, tingling, tremor, tics, unsteady gait, slurred speech, confusion.   Individual Medical History/ Review of Systems: Changes? :Yes  See HPI  Allergies: Penicillins; Tamiflu [oseltamivir phosphate]; Cephalosporins; and Latex  Current Medications:  Current Outpatient Medications:  .  Cholecalciferol (VITAMIN D3) 5000 units CAPS, Take 5,000 Units by mouth at bedtime., Disp: , Rfl:  .  clonazePAM (KLONOPIN) 0.5 MG tablet, TAKE 1 TABLET BY MOUTH EVERY 6 HOURS AS NEEDED FOR ANXIETY., Disp: 120 tablet, Rfl: 2 .  fexofenadine (ALLEGRA) 180 MG tablet, Take 180 mg by mouth at bedtime., Disp: , Rfl:  .  ibuprofen (ADVIL,MOTRIN) 200 MG tablet, Take 400 mg by mouth daily as needed for headache or moderate pain., Disp: , Rfl:  .  L-Methylfolate-Algae (DEPLIN 15) 15-90.314 MG CAPS, Take 1 capsule by mouth at bedtime. , Disp: , Rfl:  .  lamoTRIgine (LAMICTAL) 150 MG tablet, TAKE 1 TABLET BY MOUTH TWICE DAILY, Disp: 180 tablet, Rfl: 0 .  levonorgestrel (MIRENA) 20 MCG/24HR IUD, 1 each by Intrauterine  route once. , Disp: , Rfl:  .  meclizine (ANTIVERT) 25 MG tablet, Take 1 tablet (25 mg total) by mouth 3 (three) times daily as needed for dizziness., Disp: 60 tablet, Rfl: 1 .  metFORMIN (GLUCOPHAGE) 1000 MG tablet, Take 1 tablet (1,000 mg total) by mouth daily with breakfast., Disp: 30 tablet, Rfl: 0 .  Multiple Vitamin (MULTIVITAMIN) tablet, Take 1 tablet by mouth at bedtime. , Disp: , Rfl:  .  pantoprazole (PROTONIX) 40 MG tablet, Take 40 mg by mouth at bedtime. , Disp: , Rfl:  .  prazosin (MINIPRESS) 1 MG capsule, Take 1 capsule (1 mg total) by mouth at bedtime. Add to the 4 mg you're already taking to =5 mg., Disp: 90 capsule, Rfl: 0 .  prazosin (MINIPRESS) 2 MG capsule, TAKE 2 CAPSULES BY MOUTH AT BEDTIME., Disp: 180 capsule, Rfl: 0 .  propranolol (INDERAL) 40 MG tablet, Take 1 tablet (40 mg total) by mouth 2 (two) times daily., Disp: 180 tablet, Rfl: 0 .  QUEtiapine (SEROQUEL) 200 MG tablet, TAKE 1 TABLET BY MOUTH AT BEDTIME, Disp: 90 tablet, Rfl: 0 .  albuterol (PROVENTIL HFA;VENTOLIN HFA) 108 (90 Base) MCG/ACT inhaler, Inhale 2 puffs into the lungs every 4 (four) hours as needed for wheezing or shortness of breath. (Patient not taking: Reported on 01/17/2019), Disp: 1 Inhaler, Rfl: 1 .  diazepam (VALIUM) 5 MG tablet, 1-2 po about 1 30-60 minutes pre-MRI. (Patient not taking: Reported on 11/09/2018), Disp: 2 tablet, Rfl: 0 Medication Side Effects: none  Family Medical/ Social History: Changes? Yes changes due to the coronavirus pandemic and shelter in place.   MENTAL HEALTH EXAM:  There were no vitals taken for this visit.There is no height or weight on file to calculate BMI.  General Appearance: Unable to assess  Eye Contact:  Unable to assess  Speech:  Clear and Coherent  Volume:  Normal  Mood:  Euthymic  Affect:  Unable to assess  Thought Process:  Goal Directed  Orientation:  Full (Time, Place, and Person)  Thought Content: Logical   Suicidal Thoughts:  No  Homicidal Thoughts:   No  Memory:  WNL  Judgement:  Good  Insight:  Good  Psychomotor Activity:  Unable to assess  Concentration:  Concentration: Good  Recall:  Good  Fund of Knowledge: Good  Language: Good  Assets:  Desire for Improvement  ADL's:  Intact  Cognition: WNL  Prognosis:  Good    DIAGNOSES: No diagnosis found.  Receiving Psychotherapy: Yes With Heather   RECOMMENDATIONS:  DC Klonopin Start Xanax 0.5 mg, 1/2-1 4 times daily as needed.  She can take 2 at at bedtime but just no more than 4 pills a day.  She verbalizes understanding.  I am making this change because Xanax seems to work quicker and I think she needs that at this point. Continue Lamictal 150 mg twice daily. Continue Deplin 15 mg daily. Continue prazosin 5 mg nightly. Continue propranolol 40 mg twice daily. Continue Seroquel 200 mg nightly.  We discussed increasing that, however sometime last year we tried to increase  that to help with sleep and anxiety/depression but she did not tolerate it well. Continue psychotherapy. Return in 4 weeks  Donnal Moat, PA-C   This record has been created using [employer].  Chart creation errors have been sought, but may not always have been located and corrected. Such creation errors do not reflect on the standard of medical care.

## 2019-01-18 DIAGNOSIS — F4310 Post-traumatic stress disorder, unspecified: Secondary | ICD-10-CM | POA: Diagnosis not present

## 2019-01-19 ENCOUNTER — Other Ambulatory Visit: Payer: Self-pay

## 2019-01-19 ENCOUNTER — Encounter (INDEPENDENT_AMBULATORY_CARE_PROVIDER_SITE_OTHER): Payer: Self-pay | Admitting: Family Medicine

## 2019-01-19 ENCOUNTER — Ambulatory Visit (INDEPENDENT_AMBULATORY_CARE_PROVIDER_SITE_OTHER): Payer: 59 | Admitting: Family Medicine

## 2019-01-19 DIAGNOSIS — R7303 Prediabetes: Secondary | ICD-10-CM

## 2019-01-19 DIAGNOSIS — F411 Generalized anxiety disorder: Secondary | ICD-10-CM | POA: Diagnosis not present

## 2019-01-19 DIAGNOSIS — Z6841 Body Mass Index (BMI) 40.0 and over, adult: Secondary | ICD-10-CM

## 2019-01-19 DIAGNOSIS — E66813 Obesity, class 3: Secondary | ICD-10-CM

## 2019-01-19 NOTE — Progress Notes (Signed)
Office: [phone]  /  Fax: [phone] TeleHealth Visit:  HAYES REHFELDT has verbally consented to this TeleHealth visit today. The patient is located at home, the provider is located at the [facility] office. The participants in this visit include the listed provider and patient. The visit was conducted today via face time.  HPI:   Chief Complaint: OBESITY Kazaria is here to discuss her progress with her obesity treatment plan. She is on the portion control better and make smarter food choices, such as increase vegetables and decrease simple carbohydrates and is following her eating plan approximately 10 % of the time. She states she is walking for 20 minutes 7 times per week. Aylanie is feeling better and is able to go into work, and struggling with morale. Eating wise, she has done quite a bit of emotional eating. Her Psychiatrist made some adjustments to her medications. She voices that her husband is not really contributing.  We were unable to weigh the patient today for this TeleHealth visit. She feels as if she has gained 5 lbs since her last visit. She has lost 15 lbs since starting treatment with [name].  Pre-Diabetes Kyana has a diagnosis of pre-diabetes based on her elevated Hgb A1c and was informed this puts her at greater risk of developing diabetes. She notes carbohydrate cravings for sugary snacks and starches. She denies GI side effects of metformin and continues to work on diet and exercise to decrease risk of diabetes. She denies hypoglycemia.  Generalized Anxiety Disorder Tylor recently changed from [place] to Xanax. She notes significant symptoms of anxiety with COVID-19.  ASSESSMENT AND PLAN:  Prediabetes  GAD (generalized anxiety disorder)  Class 3 severe obesity with serious comorbidity and body mass index (BMI) of 40.0 to 44.9 in adult, unspecified obesity type [facility])  PLAN:  Pre-Diabetes Charmon will continue to work on weight loss,  exercise, and decreasing simple carbohydrates in her diet to help decrease the risk of diabetes. We dicussed metformin including benefits and risks. She was informed that eating too many simple carbohydrates or too many calories at one sitting increases the likelihood of GI side effects. Francile agrees to continue taking her current medications, and she agrees to follow up with our clinic in 2 weeks as directed to monitor her progress.  Generalized Anxiety Disorder Teaghan is to follow up with Donnal Moat at previously schedule appointment. Emmalyne agrees to follow up with our clinic in 2 weeks.  Obesity Modean is currently in the action stage of change. As such, her goal is to continue with weight loss efforts She has agreed to portion control better and make smarter food choices, such as increase vegetables and decrease simple carbohydrates  Airabella has been instructed to work up to a goal of 150 minutes of combined cardio and strengthening exercise per week for weight loss and overall health benefits. We discussed the following Behavioral Modification Strategies today: increasing lean protein intake, increasing vegetables and work on meal planning and easy cooking plans, keeping healthy foods in the home, better snacking choices, and planning for success   Jailin has agreed to follow up with our clinic in 2 weeks. She was informed of the importance of frequent follow up visits to maximize her success with intensive lifestyle modifications for her multiple health conditions.  ALLERGIES: Allergies  Allergen Reactions  . Penicillins Shortness Of Breath and Rash    Did it involve swelling of the face/tongue/throat, SOB, or low BP? Yes Did it involve sudden  or severe rash/hives, skin peeling, or any reaction on the inside of your mouth or nose? Yes Did you need to seek medical attention at a hospital or doctor's office? Yes When did it last happen?childhood allergy If all above answers  are "NO", may proceed with cephalosporin use.    . Tamiflu [Oseltamivir Phosphate] Anaphylaxis  . Cephalosporins Rash  . Latex Rash    MEDICATIONS: Current Outpatient Medications on File Prior to Visit  Medication Sig Dispense Refill  . albuterol (PROVENTIL HFA;VENTOLIN HFA) 108 (90 Base) MCG/ACT inhaler Inhale 2 puffs into the lungs every 4 (four) hours as needed for wheezing or shortness of breath. (Patient not taking: Reported on 01/17/2019) 1 Inhaler 1  . ALPRAZolam (XANAX) 0.5 MG tablet Take 1 tablet (0.5 mg total) by mouth 4 (four) times daily as needed for anxiety. 120 tablet 0  . Cholecalciferol (VITAMIN D3) 5000 units CAPS Take 5,000 Units by mouth at bedtime.    . fexofenadine (ALLEGRA) 180 MG tablet Take 180 mg by mouth at bedtime.    [name] ibuprofen (ADVIL,MOTRIN) 200 MG tablet Take 400 mg by mouth daily as needed for headache or moderate pain.    [name] L-Methylfolate-Algae (DEPLIN 15) 15-90.314 MG CAPS Take 1 capsule by mouth at bedtime.     . lamoTRIgine (LAMICTAL) 150 MG tablet TAKE 1 TABLET BY MOUTH TWICE DAILY 180 tablet 0  . levonorgestrel (MIRENA) 20 MCG/24HR IUD 1 each by Intrauterine route once.     . meclizine (ANTIVERT) 25 MG tablet Take 1 tablet (25 mg total) by mouth 3 (three) times daily as needed for dizziness. 60 tablet 1  . metFORMIN (GLUCOPHAGE) 1000 MG tablet Take 1 tablet (1,000 mg total) by mouth daily with breakfast. 30 tablet 0  . Multiple Vitamin (MULTIVITAMIN) tablet Take 1 tablet by mouth at bedtime.     . pantoprazole (PROTONIX) 40 MG tablet Take 40 mg by mouth at bedtime.     . prazosin (MINIPRESS) 1 MG capsule Take 1 capsule (1 mg total) by mouth at bedtime. Add to the 4 mg you're already taking to =5 mg. 90 capsule 0  . prazosin (MINIPRESS) 2 MG capsule TAKE 2 CAPSULES BY MOUTH AT BEDTIME. 180 capsule 0  . propranolol (INDERAL) 40 MG tablet Take 1 tablet (40 mg total) by mouth 2 (two) times daily. 180 tablet 0  . QUEtiapine (SEROQUEL) 200 MG tablet TAKE 1  TABLET BY MOUTH AT BEDTIME 90 tablet 0   No current facility-administered medications on file prior to visit.     PAST MEDICAL HISTORY: Past Medical History:  Diagnosis Date  . Allergy    Zyrtec, Allegra.  . Anxiety    followed by Dr. Toy Cookey  . Back pain   . Bipolar 1 disorder ([place])   . Complication of anesthesia   . Depression   . Fibroid   . Gallbladder problem   . Gastroparesis 09/14/2012   gastric emptying study in 2014  . GERD (gastroesophageal reflux disease)   . Headache   . Pneumonia    2013ish  . PONV (postoperative nausea and vomiting)   . Pre-diabetes   . PTSD (post-traumatic stress disorder)   . Sleep apnea    do not use a cpap  . Tinnitus   . Vitamin D deficiency   . Wears glasses     PAST SURGICAL HISTORY: Past Surgical History:  Procedure Laterality Date  . ANKLE ARTHROSCOPY Left 2011  . CESAREAN SECTION    . CHOLECYSTECTOMY    .  DILATION AND CURETTAGE OF UTERUS    . FOREIGN BODY REMOVAL Left 11/18/2016   Procedure: REMOVAL FOREIGN BODY EXTREMITY LEFT FOOT;  Surgeon: Trula Slade, DPM;  Location: [place];  Service: Podiatry;  Laterality: Left;  . PILONIDAL CYST EXCISION  1990's  . RADIOLOGY WITH ANESTHESIA N/A 11/10/2018   Procedure: MRI WITH ANESTHESIA OF BRAIN AND ORBITS WITH AND WITHOUT CONTRAST;  Surgeon: Radiologist, Medication, MD;  Location: [place];  Service: Radiology;  Laterality: N/A;  . TENDON REPAIR Left 2011   Left Ankle  . WISDOM TOOTH EXTRACTION  19090's    SOCIAL HISTORY: Social History   Tobacco Use  . Smoking status: Former Smoker    Years: 10.00    Types: Cigarettes    Last attempt to quit: 2003    Years since quitting: 17.3  . Smokeless tobacco: Never Used  Substance Use Topics  . Alcohol use: Yes    Alcohol/week: 0.0 standard drinks    Comment: rare  . Drug use: No    FAMILY HISTORY: Family History  Problem Relation Age of Onset  . Cancer Mother        squamous cell carcinoma  . Hyperlipidemia Mother   .  Depression Mother   . Anxiety disorder Mother   . Obesity Mother   . Heart disease Father 41       cardiomegaly, CHF; steroid use.  [name] Hyperlipidemia Father   . Hypertension Father   . Mental retardation Father   . High blood pressure Father   . Depression Father   . Anxiety disorder Father   . Obesity Father   . Cancer Maternal Grandmother   . Diabetes Maternal Grandmother   . Heart disease Maternal Grandmother   . Hyperlipidemia Maternal Grandmother   . Hypertension Maternal Grandmother   . Stroke Maternal Grandmother   . Heart disease Paternal Grandmother   . Hypertension Paternal Grandmother   . Heart disease Paternal Grandfather   . Hyperlipidemia Paternal Grandfather   . Mental illness Paternal Grandfather   . Rheum arthritis Sister   . Breast cancer Maternal Aunt   . Thyroid cancer Paternal Aunt     ROS: Review of Systems  Constitutional: Negative for weight loss.  Endo/Heme/Allergies:       Negative hypoglycemia  Psychiatric/Behavioral:       + Anxiety    PHYSICAL EXAM: Pt in no acute distress  RECENT LABS AND TESTS: BMET    Component Value Date/Time   NA 135 11/10/2018 0657   NA 138 10/10/2018 0829   K 4.4 11/10/2018 0657   CL 106 11/10/2018 0657   CO2 21 (L) 11/10/2018 0657   GLUCOSE 94 11/10/2018 0657   BUN 13 11/10/2018 0657   BUN 12 10/10/2018 0829   CREATININE 0.94 11/10/2018 0657   CREATININE 0.95 06/27/2016 0933   CALCIUM 8.6 (L) 11/10/2018 0657   GFRNONAA >60 11/10/2018 0657   GFRAA >60 11/10/2018 0657   Lab Results  Component Value Date   HGBA1C 5.2 10/10/2018   HGBA1C 5.3 05/25/2018   HGBA1C 5.4 02/10/2018   HGBA1C 5.5 10/07/2017   HGBA1C 5.1 06/22/2017   Lab Results  Component Value Date   INSULIN 21.4 10/10/2018   INSULIN 87.4 (H) 05/25/2018   INSULIN 20.3 02/10/2018   INSULIN 15.6 10/07/2017   INSULIN 25.2 (H) 06/22/2017   CBC    Component Value Date/Time   WBC 5.5 01/05/2018 0915   RBC 4.02 01/05/2018 0915   HGB 13.3  09/12/2018 1636  HGB 11.2 06/22/2017 0904   HCT 39.0 09/12/2018 1636   HCT 34.8 06/22/2017 0904   PLT 285 01/05/2018 0915   PLT 332 11/12/2016 1651   MCV 88.1 01/05/2018 0915   MCV 86 06/22/2017 0904   MCH 27.6 01/05/2018 0915   MCHC 31.4 01/05/2018 0915   RDW 14.4 01/05/2018 0915   RDW 15.7 (H) 06/22/2017 0904   LYMPHSABS 1.5 06/22/2017 0904   MONOABS 891 04/30/2016 0947   EOSABS 0.2 06/22/2017 0904   BASOSABS 0.0 06/22/2017 0904   Iron/TIBC/Ferritin/ %Sat    Component Value Date/Time   IRON 53 08/31/2016 1644   TIBC 280 08/31/2016 1644   FERRITIN 36 08/31/2016 1644   IRONPCTSAT 19 08/31/2016 1644   Lipid Panel     Component Value Date/Time   CHOL 155 10/10/2018 0829   TRIG 173 (H) 10/10/2018 0829   HDL 40 10/10/2018 0829   CHOLHDL 3.5 02/10/2018 0749   CHOLHDL 4.1 12/01/2015 0914   VLDL 37 (H) 12/01/2015 0914   LDLCALC 80 10/10/2018 0829   Hepatic Function Panel     Component Value Date/Time   PROT 6.9 10/10/2018 0829   ALBUMIN 3.9 10/10/2018 0829   AST 20 10/10/2018 0829   ALT 18 10/10/2018 0829   ALKPHOS 84 10/10/2018 0829   BILITOT <0.2 10/10/2018 0829      Component Value Date/Time   TSH 1.620 08/31/2016 1644   TSH 1.83 12/01/2015 0914      I, Trixie Dredge, am acting as transcriptionist for Ilene Qua, MD  I have reviewed the above documentation for accuracy and completeness, and I agree with the above. - Ilene Qua, MD

## 2019-01-23 ENCOUNTER — Telehealth: Payer: Self-pay | Admitting: Physician Assistant

## 2019-01-23 ENCOUNTER — Other Ambulatory Visit: Payer: Self-pay | Admitting: Physician Assistant

## 2019-01-23 MED ORDER — LORAZEPAM 1 MG PO TABS: 1.0000 mg | ORAL_TABLET | Freq: Three times a day (TID) | ORAL | 1 refills | Status: DC

## 2019-01-23 MED FILL — LORazepam 1 MG TABS: 1 | 10 days supply | Qty: 30 | Fill #0

## 2019-01-23 NOTE — Telephone Encounter (Signed)
Rx sent 

## 2019-01-23 NOTE — Telephone Encounter (Signed)
Caroline Gonzales please check with her and see if she prefers to go back on Klonopin or try Ativan.  Either way, she will need to take the unused Xanax before I can give her a prescription for either the Klonopin or Ativan.  We could also try low-dose Valium.  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

## 2019-01-23 NOTE — Telephone Encounter (Signed)
Patient is on xanax and after 75minutes that she takes it she becomes very sad and crying. Thinks she needs to be on something else. Please call her at [phone]

## 2019-01-23 NOTE — Telephone Encounter (Signed)
Have her increase the Seroquel 200mg , take 1.5 pills.  I won't call in Rx for 300mg  yet, let's see how she does with this first. I think changing to ativan would be best, at higher dose.  I'll go ahead and send it in if you could get her pharm name for me, and remind her to take other to the office.  I trust her to do that. Thanks

## 2019-01-25 DIAGNOSIS — F4310 Post-traumatic stress disorder, unspecified: Secondary | ICD-10-CM | POA: Diagnosis not present

## 2019-01-26 ENCOUNTER — Telehealth: Payer: Self-pay | Admitting: Physician Assistant

## 2019-01-26 DIAGNOSIS — M5136 Other intervertebral disc degeneration, lumbar region: Secondary | ICD-10-CM | POA: Diagnosis not present

## 2019-01-26 MED FILL — GABAPENTIN 300 MG CAPSULE: 300 | 30 days supply | Qty: 90 | Fill #0

## 2019-01-26 NOTE — Telephone Encounter (Signed)
We decided to try Ativan instead of going back to the Klonopin after we stopped the Xanax.  So she is supposed to be on Ativan.  I gave her a 1 mg pill, which is about the equivalent of a 0.5 mg of Xanax and or Klonopin so she should not having any withdrawal effects at all.  She can take it every 6 hours if needed and since were documenting this in this note although she is taking more if she needs to and can give her an earlier refill if needed.On the Seroquel yes she should be taking 300 mg.  I hope this helps.

## 2019-01-26 NOTE — Telephone Encounter (Signed)
Patient received ativan yesterday but says this dose is too low and no instructions on how to take it. Please call her and go over the instructions for all her meds. [phone]

## 2019-01-26 NOTE — Telephone Encounter (Signed)
Monday she stated that she increased seroquel to 300 Mg. And is still taking the Klonopin. She wants to know if she is supposed to be taking the Klonopin? She is afraid that you taking her off of that and starting the ativan will give her withdrawals. She was tearful during the conversation. She said that the ativan does not equal the amount of Klonopin that she was taking and that if she goes through withdrawals she will end up in the hospital. Please advise.

## 2019-01-26 NOTE — Telephone Encounter (Signed)
Ok. Take Ativan 1mg , 2 at hs.  Try that first.  Since we increased the Seroquel, may not need more benzo for sleep, but only to help relax her mind.

## 2019-01-26 NOTE — Telephone Encounter (Signed)
Pt aware and agrees, instructed to call back so we can update rx when running low and to let us know if it's not working.

## 2019-01-31 ENCOUNTER — Telehealth: Payer: Self-pay | Admitting: Physician Assistant

## 2019-01-31 NOTE — Telephone Encounter (Signed)
Left voice mail to call back 

## 2019-01-31 NOTE — Telephone Encounter (Signed)
Patient called and said that she wants to give a report of how she is doing on her meds because she is low on one of the meds that she is taking. Please call her at [phone]

## 2019-01-31 NOTE — Telephone Encounter (Signed)
Ok great.

## 2019-02-01 DIAGNOSIS — F4310 Post-traumatic stress disorder, unspecified: Secondary | ICD-10-CM | POA: Diagnosis not present

## 2019-02-02 ENCOUNTER — Other Ambulatory Visit: Payer: Self-pay

## 2019-02-02 ENCOUNTER — Encounter (INDEPENDENT_AMBULATORY_CARE_PROVIDER_SITE_OTHER): Payer: Self-pay | Admitting: Family Medicine

## 2019-02-02 ENCOUNTER — Ambulatory Visit (INDEPENDENT_AMBULATORY_CARE_PROVIDER_SITE_OTHER): Payer: 59 | Admitting: Family Medicine

## 2019-02-02 DIAGNOSIS — R7303 Prediabetes: Secondary | ICD-10-CM | POA: Diagnosis not present

## 2019-02-02 DIAGNOSIS — Z6841 Body Mass Index (BMI) 40.0 and over, adult: Secondary | ICD-10-CM | POA: Diagnosis not present

## 2019-02-02 DIAGNOSIS — F4310 Post-traumatic stress disorder, unspecified: Secondary | ICD-10-CM | POA: Diagnosis not present

## 2019-02-02 MED ORDER — METFORMIN HCL 1000 MG PO TABS: 1000.0000 mg | ORAL_TABLET | Freq: Every day | ORAL | 0 refills | Status: DC

## 2019-02-02 MED FILL — metFORMIN HCL 1000 MG TABS: 1000 | 30 days supply | Qty: 30 | Fill #0

## 2019-02-03 ENCOUNTER — Telehealth: Payer: 59 | Admitting: Nurse Practitioner

## 2019-02-03 DIAGNOSIS — H5789 Other specified disorders of eye and adnexa: Secondary | ICD-10-CM | POA: Diagnosis not present

## 2019-02-03 MED ORDER — POLYMYXIN B-TRIMETHOPRIM 10000-0.1 UNIT/ML-% OP SOLN: 2.0000 [drp] | OPHTHALMIC | 0 refills | Status: DC

## 2019-02-03 NOTE — Progress Notes (Signed)
We are sorry that you are not feeling well.  Here is how we plan to help!  Based on what you have shared with me it looks like you have conjunctivitis.  Conjunctivitis is a common inflammatory or infectious condition of the eye that is often referred to as "pink eye".  In most cases it is contagious (viral or bacterial). However, not all conjunctivitis requires antibiotics (ex. Allergic).  We have made appropriate suggestions for you based upon your presentation.  I have prescribed Polytrim Ophthalmic drops 1-2 drops 4 times a day times 5 days  Pink eye can be highly contagious.  It is typically spread through direct contact with secretions, or contaminated objects or surfaces that one may have touched.  Strict handwashing is suggested with soap and water is urged.  If not available, use alcohol based had sanitizer.  Avoid unnecessary touching of the eye.  If you wear contact lenses, you will need to refrain from wearing them until you see no white discharge from the eye for at least 24 hours after being on medication.  You should see symptom improvement in 1-2 days after starting the medication regimen.  Call us if symptoms are not improved in 1-2 days.  Home Care:  Wash your hands often!  Do not wear your contacts until you complete your treatment plan.  Avoid sharing towels, bed linen, personal items with a person who has pink eye.  See attention for anyone in your home with similar symptoms.  Get Help Right Away If:  Your symptoms do not improve.  You develop blurred or loss of vision.  Your symptoms worsen (increased discharge, pain or redness)  Your e-visit answers were reviewed by a board certified advanced clinical practitioner to complete your personal care plan.  Depending on the condition, your plan could have included both over the counter or prescription medications.  If there is a problem please reply  once you have received a response from your provider.  Your safety is  important to [name].  If you have drug allergies check your prescription carefully.    You can use MyChart to ask questions about today's visit, request a non-urgent call back, or ask for a work or school excuse for 24 hours related to this e-Visit. If it has been greater than 24 hours you will need to follow up with your provider, or enter a new e-Visit to address those concerns.   You will get an e-mail in the next two days asking about your experience.  I hope that your e-visit has been valuable and will speed your recovery. Thank you for using e-visits.   5-10 minutes spent reviewing and documenting in chart.

## 2019-02-06 NOTE — Progress Notes (Signed)
Office: [phone]  /  Fax: [phone] TeleHealth Visit:  Caroline Gonzales has verbally consented to this TeleHealth visit today. The patient is located at home, the provider is located at the [facility] office. The participants in this visit include the listed provider and patient. The visit was conducted today via face time.  HPI:   Chief Complaint: OBESITY Caroline Gonzales is here to discuss her progress with her obesity treatment plan. She is on the portion control better and make smarter food choices, such as increase vegetables and decrease simple carbohydrates and is following her eating plan approximately 75 % of the time. She states she is walking for 20 minutes 4-7 times per week. Caroline Gonzales voices some better control with eating in the past week. She does report an increase in weight of 2 lbs. She reports that she and her family are going out of town this weekend to [place].  We were unable to weigh the patient today for this TeleHealth visit. She feels as if she has gained 2 lbs since her last visit. She has lost 15 lbs since starting treatment with [name].  Pre-Diabetes Caroline Gonzales has a diagnosis of pre-diabetes based on her last elevated Hgb A1c of 5.2 and insulin level of 21.4. She was informed this puts her at greater risk of developing diabetes. She notes carbohydrate cravings. She is taking metformin currently and continues to work on diet and exercise to decrease risk of diabetes. She denies nausea or hypoglycemia.  Post Traumatic Stress Disorder Caroline Gonzales has a diagnosis of PTSD. She is seeing Donnal Moat and a therapist. She feels her medications are leveling out and finally controlling her symptoms.  ASSESSMENT AND PLAN:  PTSD (post-traumatic stress disorder)  Prediabetes - Plan: metFORMIN (GLUCOPHAGE) 1000 MG tablet  Class 3 severe obesity with serious comorbidity and body mass index (BMI) of 40.0 to 44.9 in adult, unspecified obesity type [facility])  PLAN:   Pre-Diabetes Caroline Gonzales will continue to work on weight loss, exercise, and decreasing simple carbohydrates in her diet to help decrease the risk of diabetes. We dicussed metformin including benefits and risks. She was informed that eating too many simple carbohydrates or too many calories at one sitting increases the likelihood of GI side effects. Caroline Gonzales agrees to continue taking metformin 1,000 mg PO q AM #30 and we will refill for 1 month. Caroline Gonzales agrees to follow up with our clinic in 2 weeks as directed to monitor her progress.  Post Traumatic Stress Disorder Caroline Gonzales will continue to follow up with the above providers at her previously scheduled appointments. Caroline Gonzales agrees to follow up with our clinic in 2 weeks.  Obesity Caroline Gonzales is currently in the action stage of change. As such, her goal is to continue with weight loss efforts She has agreed to portion control better and make smarter food choices, such as increase vegetables and decrease simple carbohydrates   Caroline Gonzales is to have 1 nutritionally valid meal every other day. Caroline Gonzales has been instructed to work up to a goal of 150 minutes of combined cardio and strengthening exercise per week for weight loss and overall health benefits. We discussed the following Behavioral Modification Strategies today: increasing lean protein intake, increasing vegetables and work on meal planning and easy cooking plans, keeping healthy foods in the home, and planning for success   Caroline Gonzales has agreed to follow up with our clinic in 2 weeks. She was informed of the importance of frequent follow up visits to maximize her success with intensive lifestyle  modifications for her multiple health conditions.  ALLERGIES: Allergies  Allergen Reactions  . Penicillins Shortness Of Breath and Rash    Did it involve swelling of the face/tongue/throat, SOB, or low BP? Yes Did it involve sudden or severe rash/hives, skin peeling, or any reaction on the inside of your  mouth or nose? Yes Did you need to seek medical attention at a hospital or doctor's office? Yes When did it last happen?childhood allergy If all above answers are "NO", may proceed with cephalosporin use.    . Tamiflu [Oseltamivir Phosphate] Anaphylaxis  . Cephalosporins Rash  . Latex Rash    MEDICATIONS: Current Outpatient Medications on File Prior to Visit  Medication Sig Dispense Refill  . albuterol (PROVENTIL HFA;VENTOLIN HFA) 108 (90 Base) MCG/ACT inhaler Inhale 2 puffs into the lungs every 4 (four) hours as needed for wheezing or shortness of breath. (Patient not taking: Reported on 01/17/2019) 1 Inhaler 1  . Cholecalciferol (VITAMIN D3) 5000 units CAPS Take 5,000 Units by mouth at bedtime.    . fexofenadine (ALLEGRA) 180 MG tablet Take 180 mg by mouth at bedtime.    [name] ibuprofen (ADVIL,MOTRIN) 200 MG tablet Take 400 mg by mouth daily as needed for headache or moderate pain.    [name] L-Methylfolate-Algae (DEPLIN 15) 15-90.314 MG CAPS Take 1 capsule by mouth at bedtime.     . lamoTRIgine (LAMICTAL) 150 MG tablet TAKE 1 TABLET BY MOUTH TWICE DAILY 180 tablet 0  . levonorgestrel (MIRENA) 20 MCG/24HR IUD 1 each by Intrauterine route once.     [name] LORazepam (ATIVAN) 1 MG tablet Take 1 tablet (1 mg total) by mouth every 8 (eight) hours. 30 tablet 1  . meclizine (ANTIVERT) 25 MG tablet Take 1 tablet (25 mg total) by mouth 3 (three) times daily as needed for dizziness. 60 tablet 1  . Multiple Vitamin (MULTIVITAMIN) tablet Take 1 tablet by mouth at bedtime.     . pantoprazole (PROTONIX) 40 MG tablet Take 40 mg by mouth at bedtime.     . prazosin (MINIPRESS) 1 MG capsule Take 1 capsule (1 mg total) by mouth at bedtime. Add to the 4 mg you're already taking to =5 mg. 90 capsule 0  . prazosin (MINIPRESS) 2 MG capsule TAKE 2 CAPSULES BY MOUTH AT BEDTIME. 180 capsule 0  . propranolol (INDERAL) 40 MG tablet Take 1 tablet (40 mg total) by mouth 2 (two) times daily. 180 tablet 0  . QUEtiapine  (SEROQUEL) 200 MG tablet TAKE 1 TABLET BY MOUTH AT BEDTIME 90 tablet 0   No current facility-administered medications on file prior to visit.     PAST MEDICAL HISTORY: Past Medical History:  Diagnosis Date  . Allergy    Zyrtec, Allegra.  . Anxiety    followed by Dr. Toy Cookey  . Back pain   . Bipolar 1 disorder ([place])   . Complication of anesthesia   . Depression   . Fibroid   . Gallbladder problem   . Gastroparesis 09/14/2012   gastric emptying study in 2014  . GERD (gastroesophageal reflux disease)   . Headache   . Pneumonia    2013ish  . PONV (postoperative nausea and vomiting)   . Pre-diabetes   . PTSD (post-traumatic stress disorder)   . Sleep apnea    do not use a cpap  . Tinnitus   . Vitamin D deficiency   . Wears glasses     PAST SURGICAL HISTORY: Past Surgical History:  Procedure Laterality Date  . ANKLE ARTHROSCOPY Left  2011  . CESAREAN SECTION    . CHOLECYSTECTOMY    . DILATION AND CURETTAGE OF UTERUS    . FOREIGN BODY REMOVAL Left 11/18/2016   Procedure: REMOVAL FOREIGN BODY EXTREMITY LEFT FOOT;  Surgeon: Trula Slade, DPM;  Location: [facility];  Service: Podiatry;  Laterality: Left;  . PILONIDAL CYST EXCISION  1990's  . RADIOLOGY WITH ANESTHESIA N/A 11/10/2018   Procedure: MRI WITH ANESTHESIA OF BRAIN AND ORBITS WITH AND WITHOUT CONTRAST;  Surgeon: Radiologist, Medication, MD;  Location: [facility];  Service: Radiology;  Laterality: N/A;  . TENDON REPAIR Left 2011   Left Ankle  . WISDOM TOOTH EXTRACTION  19090's    SOCIAL HISTORY: Social History   Tobacco Use  . Smoking status: Former Smoker    Years: 10.00    Types: Cigarettes    Last attempt to quit: 2003    Years since quitting: 17.4  . Smokeless tobacco: Never Used  Substance Use Topics  . Alcohol use: Yes    Alcohol/week: 0.0 standard drinks    Comment: rare  . Drug use: No    FAMILY HISTORY: Family History  Problem Relation Age of Onset  . Cancer Mother        squamous cell carcinoma  .  Hyperlipidemia Mother   . Depression Mother   . Anxiety disorder Mother   . Obesity Mother   . Heart disease Father 46       cardiomegaly, CHF; steroid use.  [name] Hyperlipidemia Father   . Hypertension Father   . Mental retardation Father   . High blood pressure Father   . Depression Father   . Anxiety disorder Father   . Obesity Father   . Cancer Maternal Grandmother   . Diabetes Maternal Grandmother   . Heart disease Maternal Grandmother   . Hyperlipidemia Maternal Grandmother   . Hypertension Maternal Grandmother   . Stroke Maternal Grandmother   . Heart disease Paternal Grandmother   . Hypertension Paternal Grandmother   . Heart disease Paternal Grandfather   . Hyperlipidemia Paternal Grandfather   . Mental illness Paternal Grandfather   . Rheum arthritis Sister   . Breast cancer Maternal Aunt   . Thyroid cancer Paternal Aunt     ROS: Review of Systems  Constitutional: Negative for weight loss.  Gastrointestinal: Negative for nausea.  Endo/Heme/Allergies:       Negative hypoglycemia    PHYSICAL EXAM: Pt in no acute distress  RECENT LABS AND TESTS: BMET    Component Value Date/Time   NA 135 11/10/2018 0657   NA 138 10/10/2018 0829   K 4.4 11/10/2018 0657   CL 106 11/10/2018 0657   CO2 21 (L) 11/10/2018 0657   GLUCOSE 94 11/10/2018 0657   BUN 13 11/10/2018 0657   BUN 12 10/10/2018 0829   CREATININE 0.94 11/10/2018 0657   CREATININE 0.95 06/27/2016 0933   CALCIUM 8.6 (L) 11/10/2018 0657   GFRNONAA >60 11/10/2018 0657   GFRAA >60 11/10/2018 0657   Lab Results  Component Value Date   HGBA1C 5.2 10/10/2018   HGBA1C 5.3 05/25/2018   HGBA1C 5.4 02/10/2018   HGBA1C 5.5 10/07/2017   HGBA1C 5.1 06/22/2017   Lab Results  Component Value Date   INSULIN 21.4 10/10/2018   INSULIN 87.4 (H) 05/25/2018   INSULIN 20.3 02/10/2018   INSULIN 15.6 10/07/2017   INSULIN 25.2 (H) 06/22/2017   CBC    Component Value Date/Time   WBC 5.5 01/05/2018 0915   RBC 4.02  01/05/2018  0915   HGB 13.3 09/12/2018 1636   HGB 11.2 06/22/2017 0904   HCT 39.0 09/12/2018 1636   HCT 34.8 06/22/2017 0904   PLT 285 01/05/2018 0915   PLT 332 11/12/2016 1651   MCV 88.1 01/05/2018 0915   MCV 86 06/22/2017 0904   MCH 27.6 01/05/2018 0915   MCHC 31.4 01/05/2018 0915   RDW 14.4 01/05/2018 0915   RDW 15.7 (H) 06/22/2017 0904   LYMPHSABS 1.5 06/22/2017 0904   MONOABS 891 04/30/2016 0947   EOSABS 0.2 06/22/2017 0904   BASOSABS 0.0 06/22/2017 0904   Iron/TIBC/Ferritin/ %Sat    Component Value Date/Time   IRON 53 08/31/2016 1644   TIBC 280 08/31/2016 1644   FERRITIN 36 08/31/2016 1644   IRONPCTSAT 19 08/31/2016 1644   Lipid Panel     Component Value Date/Time   CHOL 155 10/10/2018 0829   TRIG 173 (H) 10/10/2018 0829   HDL 40 10/10/2018 0829   CHOLHDL 3.5 02/10/2018 0749   CHOLHDL 4.1 12/01/2015 0914   VLDL 37 (H) 12/01/2015 0914   LDLCALC 80 10/10/2018 0829   Hepatic Function Panel     Component Value Date/Time   PROT 6.9 10/10/2018 0829   ALBUMIN 3.9 10/10/2018 0829   AST 20 10/10/2018 0829   ALT 18 10/10/2018 0829   ALKPHOS 84 10/10/2018 0829   BILITOT <0.2 10/10/2018 0829      Component Value Date/Time   TSH 1.620 08/31/2016 1644   TSH 1.83 12/01/2015 0914      I, Trixie Dredge, am acting as transcriptionist for Ilene Qua, MD  I have reviewed the above documentation for accuracy and completeness, and I agree with the above. - Ilene Qua, MD

## 2019-02-08 ENCOUNTER — Other Ambulatory Visit: Payer: Self-pay | Admitting: Physician Assistant

## 2019-02-08 DIAGNOSIS — F4310 Post-traumatic stress disorder, unspecified: Secondary | ICD-10-CM | POA: Diagnosis not present

## 2019-02-08 MED FILL — clonazePAM 0.5 MG TABS: 0.5 | 30 days supply | Qty: 120 | Fill #0

## 2019-02-08 NOTE — Telephone Encounter (Signed)
Taking seroquel and klonopin now. Has appt 06/02

## 2019-02-09 ENCOUNTER — Encounter: Payer: Self-pay | Admitting: Family Medicine

## 2019-02-14 ENCOUNTER — Other Ambulatory Visit: Payer: Self-pay

## 2019-02-14 ENCOUNTER — Encounter: Payer: Self-pay | Admitting: Physician Assistant

## 2019-02-14 ENCOUNTER — Ambulatory Visit (INDEPENDENT_AMBULATORY_CARE_PROVIDER_SITE_OTHER): Payer: 59 | Admitting: Physician Assistant

## 2019-02-14 DIAGNOSIS — F4310 Post-traumatic stress disorder, unspecified: Secondary | ICD-10-CM

## 2019-02-14 DIAGNOSIS — G4733 Obstructive sleep apnea (adult) (pediatric): Secondary | ICD-10-CM | POA: Diagnosis not present

## 2019-02-14 DIAGNOSIS — F411 Generalized anxiety disorder: Secondary | ICD-10-CM | POA: Diagnosis not present

## 2019-02-14 DIAGNOSIS — F331 Major depressive disorder, recurrent, moderate: Secondary | ICD-10-CM

## 2019-02-14 DIAGNOSIS — G4700 Insomnia, unspecified: Secondary | ICD-10-CM | POA: Diagnosis not present
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
## 2019-02-15 DIAGNOSIS — F4310 Post-traumatic stress disorder, unspecified: Secondary | ICD-10-CM | POA: Diagnosis not present

## 2019-02-16 ENCOUNTER — Other Ambulatory Visit: Payer: Self-pay

## 2019-02-16 ENCOUNTER — Ambulatory Visit (INDEPENDENT_AMBULATORY_CARE_PROVIDER_SITE_OTHER): Payer: 59 | Admitting: Family Medicine

## 2019-02-16 ENCOUNTER — Encounter (INDEPENDENT_AMBULATORY_CARE_PROVIDER_SITE_OTHER): Payer: Self-pay | Admitting: Family Medicine

## 2019-02-16 DIAGNOSIS — Z6841 Body Mass Index (BMI) 40.0 and over, adult: Secondary | ICD-10-CM

## 2019-02-16 DIAGNOSIS — R7303 Prediabetes: Secondary | ICD-10-CM | POA: Diagnosis not present

## 2019-02-16 DIAGNOSIS — E781 Pure hyperglyceridemia: Secondary | ICD-10-CM | POA: Diagnosis not present

## 2019-02-16 NOTE — Progress Notes (Signed)
Office: [phone]  /  Fax: [phone] TeleHealth Visit:  Caroline Gonzales has verbally consented to this TeleHealth visit today. The patient is located at home, the provider is located at the [employer] [facility] office. The participants in this visit include the listed provider and patient. The visit was conducted today via face time.  HPI:   Chief Complaint: OBESITY Caroline Gonzales is here to discuss her progress with her obesity treatment plan. She is on the portion control better and make smarter food choices, such as increase vegetables and decrease simple carbohydrates and is following her eating plan approximately 100 % of the time. She states she is walking for 15 minutes 7 times per week. Caroline Gonzales is currently at home taking FMLA day, secondary to increase in stress at work. She did go away to [facility] for 3 days by herself. She found she did continue to make healthier options every other day.  We were unable to weigh the patient today for this TeleHealth visit. She is unsure if she has lost or gained weight since her last visit. She has lost 15 lbs since starting treatment with [name].  Pre-Diabetes Caroline Gonzales has a diagnosis of pre-diabetes based on her elevated Hgb A1c and was informed this puts her at greater risk of developing diabetes. She denies GI side effects of metformin and notes that she is still struggling with indulgent eating. She continues to work on diet and exercise to decrease risk of diabetes. She denies hypoglycemia.  Hypertriglyceridemia Caroline Gonzales's last triglycerides was of 17 and LDL of 60.  ASSESSMENT AND PLAN:  Prediabetes  Hypertriglyceridemia  Class 3 severe obesity with serious comorbidity and body mass index (BMI) of 40.0 to 44.9 in adult, unspecified obesity type [facility])  PLAN:  Pre-Diabetes Caroline Gonzales will continue to work on weight loss, exercise, and decreasing simple carbohydrates in her diet to help decrease the risk of diabetes. We dicussed  metformin including benefits and risks. She was informed that eating too many simple carbohydrates or too many calories at one sitting increases the likelihood of GI side effects. Caroline Gonzales agrees to continue taking metformin, and we will repeat labs at her first in person appointment. Caroline Gonzales agrees to follow up with our clinic in 2 weeks as directed to monitor her progress.  Hypertriglyceridemia We will repeat labs in early June. Caroline Gonzales agrees to follow up with our clinic in 2 weeks.  Obesity Caroline Gonzales is currently in the action stage of change. As such, her goal is to continue with weight loss efforts She has agreed to portion control better and make smarter food choices, such as increase vegetables and decrease simple carbohydrates   Caroline Gonzales is to aim for choosing 1 healthier meal option everyday. Caroline Gonzales has been instructed to work up to a goal of 150 minutes of combined cardio and strengthening exercise per week for weight loss and overall health benefits. We discussed the following Behavioral Modification Strategies today: increasing lean protein intake, increasing vegetables and work on meal planning and easy cooking plans, keeping healthy foods in the home, better snacking choices, and planning for success   Caroline Gonzales has agreed to follow up with our clinic in 2 weeks. She was informed of the importance of frequent follow up visits to maximize her success with intensive lifestyle modifications for her multiple health conditions.  ALLERGIES: Allergies  Allergen Reactions  . Penicillins Shortness Of Breath and Rash    Did it involve swelling of the face/tongue/throat, SOB, or low BP? Yes Did it involve sudden  or severe rash/hives, skin peeling, or any reaction on the inside of your mouth or nose? Yes Did you need to seek medical attention at a hospital or doctor's office? Yes When did it last happen?childhood allergy If all above answers are "NO", may proceed with cephalosporin use.     . Tamiflu [Oseltamivir Phosphate] Anaphylaxis  . Cephalosporins Rash  . Latex Rash    MEDICATIONS: Current Outpatient Medications on File Prior to Visit  Medication Sig Dispense Refill  . albuterol (PROVENTIL HFA;VENTOLIN HFA) 108 (90 Base) MCG/ACT inhaler Inhale 2 puffs into the lungs every 4 (four) hours as needed for wheezing or shortness of breath. (Patient not taking: Reported on 01/17/2019) 1 Inhaler 1  . Cholecalciferol (VITAMIN D3) 5000 units CAPS Take 5,000 Units by mouth at bedtime.    . clonazePAM (KLONOPIN) 0.5 MG tablet TAKE 1 TABLET BY MOUTH EVERY 6 HOURS AS NEEDED FOR ANXIETY 120 tablet 1  . fexofenadine (ALLEGRA) 180 MG tablet Take 180 mg by mouth at bedtime.    [name] ibuprofen (ADVIL,MOTRIN) 200 MG tablet Take 400 mg by mouth daily as needed for headache or moderate pain.    [name] L-Methylfolate-Algae (DEPLIN 15) 15-90.314 MG CAPS Take 1 capsule by mouth at bedtime.     . lamoTRIgine (LAMICTAL) 150 MG tablet TAKE 1 TABLET BY MOUTH TWICE DAILY 180 tablet 0  . levonorgestrel (MIRENA) 20 MCG/24HR IUD 1 each by Intrauterine route once.     . meclizine (ANTIVERT) 25 MG tablet Take 1 tablet (25 mg total) by mouth 3 (three) times daily as needed for dizziness. (Patient not taking: Reported on 02/14/2019) 60 tablet 1  . metFORMIN (GLUCOPHAGE) 1000 MG tablet Take 1 tablet (1,000 mg total) by mouth daily with breakfast. 30 tablet 0  . Multiple Vitamin (MULTIVITAMIN) tablet Take 1 tablet by mouth at bedtime.     . pantoprazole (PROTONIX) 40 MG tablet Take 40 mg by mouth at bedtime.     . prazosin (MINIPRESS) 1 MG capsule Take 1 capsule (1 mg total) by mouth at bedtime. Add to the 4 mg you're already taking to =5 mg. 90 capsule 0  . prazosin (MINIPRESS) 2 MG capsule TAKE 2 CAPSULES BY MOUTH AT BEDTIME. 180 capsule 0  . propranolol (INDERAL) 40 MG tablet Take 1 tablet (40 mg total) by mouth 2 (two) times daily. 180 tablet 0  . QUEtiapine (SEROQUEL) 200 MG tablet TAKE 1 TABLET BY MOUTH AT BEDTIME  (Patient taking differently: 300 mg at bedtime. ) 90 tablet 0  . trimethoprim-polymyxin b (POLYTRIM) ophthalmic solution Place 2 drops into both eyes every 4 (four) hours. 10 mL 0   No current facility-administered medications on file prior to visit.     PAST MEDICAL HISTORY: Past Medical History:  Diagnosis Date  . Allergy    Zyrtec, Allegra.  . Anxiety    followed by Dr. Toy Cookey  . Back pain   . Bipolar 1 disorder ([place])   . Complication of anesthesia   . Depression   . Fibroid   . Gallbladder problem   . Gastroparesis 09/14/2012   gastric emptying study in 2014  . GERD (gastroesophageal reflux disease)   . Headache   . Pneumonia    2013ish  . PONV (postoperative nausea and vomiting)   . Pre-diabetes   . PTSD (post-traumatic stress disorder)   . Sleep apnea    do not use a cpap  . Tinnitus   . Vitamin D deficiency   . Wears glasses  PAST SURGICAL HISTORY: Past Surgical History:  Procedure Laterality Date  . ANKLE ARTHROSCOPY Left 2011  . CESAREAN SECTION    . CHOLECYSTECTOMY    . DILATION AND CURETTAGE OF UTERUS    . FOREIGN BODY REMOVAL Left 11/18/2016   Procedure: REMOVAL FOREIGN BODY EXTREMITY LEFT FOOT;  Surgeon: Trula Slade, DPM;  Location: [place];  Service: Podiatry;  Laterality: Left;  . PILONIDAL CYST EXCISION  1990's  . RADIOLOGY WITH ANESTHESIA N/A 11/10/2018   Procedure: MRI WITH ANESTHESIA OF BRAIN AND ORBITS WITH AND WITHOUT CONTRAST;  Surgeon: Radiologist, Medication, MD;  Location: [place];  Service: Radiology;  Laterality: N/A;  . TENDON REPAIR Left 2011   Left Ankle  . WISDOM TOOTH EXTRACTION  19090's    SOCIAL HISTORY: Social History   Tobacco Use  . Smoking status: Former Smoker    Years: 10.00    Types: Cigarettes    Last attempt to quit: 2003    Years since quitting: 17.4  . Smokeless tobacco: Never Used  Substance Use Topics  . Alcohol use: Yes    Alcohol/week: 0.0 standard drinks    Comment: rare  . Drug use: No    FAMILY  HISTORY: Family History  Problem Relation Age of Onset  . Cancer Mother        squamous cell carcinoma  . Hyperlipidemia Mother   . Depression Mother   . Anxiety disorder Mother   . Obesity Mother   . Heart disease Father 58       cardiomegaly, CHF; steroid use.  [name] Hyperlipidemia Father   . Hypertension Father   . Mental retardation Father   . High blood pressure Father   . Depression Father   . Anxiety disorder Father   . Obesity Father   . Cancer Maternal Grandmother   . Diabetes Maternal Grandmother   . Heart disease Maternal Grandmother   . Hyperlipidemia Maternal Grandmother   . Hypertension Maternal Grandmother   . Stroke Maternal Grandmother   . Heart disease Paternal Grandmother   . Hypertension Paternal Grandmother   . Heart disease Paternal Grandfather   . Hyperlipidemia Paternal Grandfather   . Mental illness Paternal Grandfather   . Rheum arthritis Sister   . Breast cancer Maternal Aunt   . Thyroid cancer Paternal Aunt     ROS: Review of Systems  Constitutional: Negative for weight loss.  Endo/Heme/Allergies:       Negative hypoglycemia    PHYSICAL EXAM: Pt in no acute distress  RECENT LABS AND TESTS: BMET    Component Value Date/Time   NA 135 11/10/2018 0657   NA 138 10/10/2018 0829   K 4.4 11/10/2018 0657   CL 106 11/10/2018 0657   CO2 21 (L) 11/10/2018 0657   GLUCOSE 94 11/10/2018 0657   BUN 13 11/10/2018 0657   BUN 12 10/10/2018 0829   CREATININE 0.94 11/10/2018 0657   CREATININE 0.95 06/27/2016 0933   CALCIUM 8.6 (L) 11/10/2018 0657   GFRNONAA >60 11/10/2018 0657   GFRAA >60 11/10/2018 0657   Lab Results  Component Value Date   HGBA1C 5.2 10/10/2018   HGBA1C 5.3 05/25/2018   HGBA1C 5.4 02/10/2018   HGBA1C 5.5 10/07/2017   HGBA1C 5.1 06/22/2017   Lab Results  Component Value Date   INSULIN 21.4 10/10/2018   INSULIN 87.4 (H) 05/25/2018   INSULIN 20.3 02/10/2018   INSULIN 15.6 10/07/2017   INSULIN 25.2 (H) 06/22/2017   CBC     Component Value Date/Time  WBC 5.5 01/05/2018 0915   RBC 4.02 01/05/2018 0915   HGB 13.3 09/12/2018 1636   HGB 11.2 06/22/2017 0904   HCT 39.0 09/12/2018 1636   HCT 34.8 06/22/2017 0904   PLT 285 01/05/2018 0915   PLT 332 11/12/2016 1651   MCV 88.1 01/05/2018 0915   MCV 86 06/22/2017 0904   MCH 27.6 01/05/2018 0915   MCHC 31.4 01/05/2018 0915   RDW 14.4 01/05/2018 0915   RDW 15.7 (H) 06/22/2017 0904   LYMPHSABS 1.5 06/22/2017 0904   MONOABS 891 04/30/2016 0947   EOSABS 0.2 06/22/2017 0904   BASOSABS 0.0 06/22/2017 0904   Iron/TIBC/Ferritin/ %Sat    Component Value Date/Time   IRON 53 08/31/2016 1644   TIBC 280 08/31/2016 1644   FERRITIN 36 08/31/2016 1644   IRONPCTSAT 19 08/31/2016 1644   Lipid Panel     Component Value Date/Time   CHOL 155 10/10/2018 0829   TRIG 173 (H) 10/10/2018 0829   HDL 40 10/10/2018 0829   CHOLHDL 3.5 02/10/2018 0749   CHOLHDL 4.1 12/01/2015 0914   VLDL 37 (H) 12/01/2015 0914   LDLCALC 80 10/10/2018 0829   Hepatic Function Panel     Component Value Date/Time   PROT 6.9 10/10/2018 0829   ALBUMIN 3.9 10/10/2018 0829   AST 20 10/10/2018 0829   ALT 18 10/10/2018 0829   ALKPHOS 84 10/10/2018 0829   BILITOT <0.2 10/10/2018 0829      Component Value Date/Time   TSH 1.620 08/31/2016 1644   TSH 1.83 12/01/2015 0914      I, Trixie Dredge, am acting as transcriptionist for Ilene Qua, MD  I have reviewed the above documentation for accuracy and completeness, and I agree with the above. - Ilene Qua, MD

## 2019-03-01 DIAGNOSIS — F4310 Post-traumatic stress disorder, unspecified: Secondary | ICD-10-CM | POA: Diagnosis not present

## 2019-03-02 ENCOUNTER — Ambulatory Visit (INDEPENDENT_AMBULATORY_CARE_PROVIDER_SITE_OTHER): Payer: 59 | Admitting: Family Medicine

## 2019-03-02 ENCOUNTER — Other Ambulatory Visit: Payer: Self-pay

## 2019-03-02 ENCOUNTER — Encounter (INDEPENDENT_AMBULATORY_CARE_PROVIDER_SITE_OTHER): Payer: Self-pay | Admitting: Family Medicine

## 2019-03-02 DIAGNOSIS — Z6841 Body Mass Index (BMI) 40.0 and over, adult: Secondary | ICD-10-CM | POA: Diagnosis not present

## 2019-03-02 DIAGNOSIS — M5416 Radiculopathy, lumbar region: Secondary | ICD-10-CM | POA: Diagnosis not present

## 2019-03-02 DIAGNOSIS — R7303 Prediabetes: Secondary | ICD-10-CM

## 2019-03-02 DIAGNOSIS — E66813 Obesity, class 3: Secondary | ICD-10-CM

## 2019-03-02 DIAGNOSIS — E559 Vitamin D deficiency, unspecified: Secondary | ICD-10-CM | POA: Diagnosis not present

## 2019-03-02 MED ORDER — METFORMIN HCL 1000 MG PO TABS: 1000.0000 mg | ORAL_TABLET | Freq: Every day | ORAL | 0 refills | Status: DC

## 2019-03-02 MED FILL — metFORMIN HCL 1000 MG TABS: 1000 | 30 days supply | Qty: 30 | Fill #0

## 2019-03-06 ENCOUNTER — Encounter: Payer: Self-pay | Admitting: Physician Assistant

## 2019-03-06 ENCOUNTER — Other Ambulatory Visit: Payer: 59

## 2019-03-06 ENCOUNTER — Telehealth: Payer: Self-pay

## 2019-03-06 ENCOUNTER — Telehealth: Payer: 59 | Admitting: Physician Assistant

## 2019-03-06 DIAGNOSIS — Z20822 Contact with and (suspected) exposure to covid-19: Secondary | ICD-10-CM

## 2019-03-06 DIAGNOSIS — R509 Fever, unspecified: Secondary | ICD-10-CM

## 2019-03-06 DIAGNOSIS — R070 Pain in throat: Secondary | ICD-10-CM | POA: Diagnosis not present

## 2019-03-06 DIAGNOSIS — R439 Unspecified disturbances of smell and taste: Secondary | ICD-10-CM | POA: Diagnosis not present

## 2019-03-06 DIAGNOSIS — R6889 Other general symptoms and signs: Secondary | ICD-10-CM | POA: Diagnosis not present

## 2019-03-06 DIAGNOSIS — J029 Acute pharyngitis, unspecified: Secondary | ICD-10-CM

## 2019-03-06 MED ORDER — AZITHROMYCIN 250 MG PO TABS: ORAL_TABLET | ORAL | 0 refills | Status: DC

## 2019-03-06 MED FILL — AZITHROMYCIN 250 MG TABLET: 250 | 5 days supply | Qty: 6 | Fill #0

## 2019-03-06 NOTE — Progress Notes (Signed)
Office: [phone]  /  Fax: [phone] TeleHealth Visit:  Caroline Gonzales has verbally consented to this TeleHealth visit today. The patient is located at home, the provider is located at the [employer] [facility] office. The participants in this visit include the listed provider and patient. The visit was conducted today via face time.  HPI:   Chief Complaint: OBESITY Caroline Gonzales is here to discuss her progress with her obesity treatment plan. She is on the portion control better and make smarter food choices, such as increase vegetables and decrease simple carbohydrates and is following her eating plan approximately 50 % of the time. She states she is walking for 20 minutes 5 times per week. Caroline Gonzales has had a stressful few weeks with familial stress. She has been doing stress eating secondary to this. Her weight is 284 lbs today. She had her back injected today. She is not ready to make a significant steps in her personal life yet. She notes fairly significant carbohydrate cravings. She states her blood pressure is 127/69. We were unable to weigh the patient today for this TeleHealth visit. She is unsure if she has lost or gained weight since her last visit. She has lost 15 lbs since starting treatment with [name].  Pre-Diabetes Caroline Gonzales has a diagnosis of pre-diabetes based on her elevated Hgb A1c and was informed this puts her at greater risk of developing diabetes. She denies GI side effects of metformin and notes excessive carbohydrate cravings. She continues to work on diet and exercise to decrease risk of diabetes. She denies hypoglycemia.  Vitamin D Deficiency Caroline Gonzales has a diagnosis of vitamin D deficiency. She is currently taking OTC Vit D. She notes fatigue and denies nausea, vomiting or muscle weakness.  ASSESSMENT AND PLAN:  Prediabetes - Plan: metFORMIN (GLUCOPHAGE) 1000 MG tablet  Vitamin D deficiency  Class 3 severe obesity with serious comorbidity and body mass index  (BMI) of 40.0 to 44.9 in adult, unspecified obesity type [facility])  PLAN:  Pre-Diabetes Caroline Gonzales will continue to work on weight loss, exercise, and decreasing simple carbohydrates in her diet to help decrease the risk of diabetes. We dicussed metformin including benefits and risks. She was informed that eating too many simple carbohydrates or too many calories at one sitting increases the likelihood of GI side effects. Caroline Gonzales agrees to continue taking metformin 1,000 mg PO daily #30 and we will refill for 1 month. Caroline Gonzales agrees to follow up with our clinic in 2 weeks as directed to monitor her progress.  Vitamin D Deficiency Caroline Gonzales was informed that low vitamin D levels contributes to fatigue and are associated with obesity, breast, and colon cancer. Caroline Gonzales agrees to continue taking OTC Vit D and will follow up for routine testing of vitamin D, at least 2-3 times per year. She was informed of the risk of over-replacement of vitamin D and agrees to not increase her dose unless she discusses this with [name] first. Caroline Gonzales agrees to follow up with our clinic in 2 weeks.  Obesity Caroline Gonzales is currently in the action stage of change. As such, her goal is to continue with weight loss efforts She has agreed to portion control better and make smarter food choices, such as increase vegetables and decrease simple carbohydrates  Caroline Gonzales has been instructed to work up to a goal of 150 minutes of combined cardio and strengthening exercise per week for weight loss and overall health benefits. We discussed the following Behavioral Modification Strategies today: increasing lean protein intake, increasing vegetables  and work on meal planning and easy cooking plans, keeping healthy foods in the home, better snacking choices, and planning for success   Caroline Gonzales has agreed to follow up with our clinic in 2 weeks. She was informed of the importance of frequent follow up visits to maximize her success with intensive lifestyle  modifications for her multiple health conditions.  ALLERGIES: Allergies  Allergen Reactions  . Penicillins Shortness Of Breath and Rash    Did it involve swelling of the face/tongue/throat, SOB, or low BP? Yes Did it involve sudden or severe rash/hives, skin peeling, or any reaction on the inside of your mouth or nose? Yes Did you need to seek medical attention at a hospital or doctor's office? Yes When did it last happen?childhood allergy If all above answers are "NO", may proceed with cephalosporin use.    . Tamiflu [Oseltamivir Phosphate] Anaphylaxis  . Cephalosporins Rash  . Latex Rash    MEDICATIONS: Current Outpatient Medications on File Prior to Visit  Medication Sig Dispense Refill  . albuterol (PROVENTIL HFA;VENTOLIN HFA) 108 (90 Base) MCG/ACT inhaler Inhale 2 puffs into the lungs every 4 (four) hours as needed for wheezing or shortness of breath. (Patient not taking: Reported on 01/17/2019) 1 Inhaler 1  . Cholecalciferol (VITAMIN D3) 5000 units CAPS Take 5,000 Units by mouth at bedtime.    . clonazePAM (KLONOPIN) 0.5 MG tablet TAKE 1 TABLET BY MOUTH EVERY 6 HOURS AS NEEDED FOR ANXIETY 120 tablet 1  . fexofenadine (ALLEGRA) 180 MG tablet Take 180 mg by mouth at bedtime.    [name] ibuprofen (ADVIL,MOTRIN) 200 MG tablet Take 400 mg by mouth daily as needed for headache or moderate pain.    [name] L-Methylfolate-Algae (DEPLIN 15) 15-90.314 MG CAPS Take 1 capsule by mouth at bedtime.     . lamoTRIgine (LAMICTAL) 150 MG tablet TAKE 1 TABLET BY MOUTH TWICE DAILY 180 tablet 0  . levonorgestrel (MIRENA) 20 MCG/24HR IUD 1 each by Intrauterine route once.     . meclizine (ANTIVERT) 25 MG tablet Take 1 tablet (25 mg total) by mouth 3 (three) times daily as needed for dizziness. (Patient not taking: Reported on 02/14/2019) 60 tablet 1  . Multiple Vitamin (MULTIVITAMIN) tablet Take 1 tablet by mouth at bedtime.     . pantoprazole (PROTONIX) 40 MG tablet Take 40 mg by mouth at bedtime.     .  prazosin (MINIPRESS) 1 MG capsule Take 1 capsule (1 mg total) by mouth at bedtime. Add to the 4 mg you're already taking to =5 mg. 90 capsule 0  . prazosin (MINIPRESS) 2 MG capsule TAKE 2 CAPSULES BY MOUTH AT BEDTIME. 180 capsule 0  . propranolol (INDERAL) 40 MG tablet Take 1 tablet (40 mg total) by mouth 2 (two) times daily. 180 tablet 0  . QUEtiapine (SEROQUEL) 200 MG tablet TAKE 1 TABLET BY MOUTH AT BEDTIME (Patient taking differently: 300 mg at bedtime. ) 90 tablet 0  . trimethoprim-polymyxin b (POLYTRIM) ophthalmic solution Place 2 drops into both eyes every 4 (four) hours. 10 mL 0   No current facility-administered medications on file prior to visit.     PAST MEDICAL HISTORY: Past Medical History:  Diagnosis Date  . Allergy    Zyrtec, Allegra.  . Anxiety    followed by Dr. Toy Cookey  . Back pain   . Bipolar 1 disorder ([place])   . Complication of anesthesia   . Depression   . Fibroid   . Gallbladder problem   . Gastroparesis 09/14/2012  gastric emptying study in 2014  . GERD (gastroesophageal reflux disease)   . Headache   . Pneumonia    2013ish  . PONV (postoperative nausea and vomiting)   . Pre-diabetes   . PTSD (post-traumatic stress disorder)   . Sleep apnea    do not use a cpap  . Tinnitus   . Vitamin D deficiency   . Wears glasses     PAST SURGICAL HISTORY: Past Surgical History:  Procedure Laterality Date  . ANKLE ARTHROSCOPY Left 2011  . CESAREAN SECTION    . CHOLECYSTECTOMY    . DILATION AND CURETTAGE OF UTERUS    . FOREIGN BODY REMOVAL Left 11/18/2016   Procedure: REMOVAL FOREIGN BODY EXTREMITY LEFT FOOT;  Surgeon: Trula Slade, DPM;  Location: [place];  Service: Podiatry;  Laterality: Left;  . PILONIDAL CYST EXCISION  1990's  . RADIOLOGY WITH ANESTHESIA N/A 11/10/2018   Procedure: MRI WITH ANESTHESIA OF BRAIN AND ORBITS WITH AND WITHOUT CONTRAST;  Surgeon: Radiologist, Medication, MD;  Location: [place];  Service: Radiology;  Laterality: N/A;  . TENDON REPAIR  Left 2011   Left Ankle  . WISDOM TOOTH EXTRACTION  19090's    SOCIAL HISTORY: Social History   Tobacco Use  . Smoking status: Former Smoker    Years: 10.00    Types: Cigarettes    Quit date: 2003    Years since quitting: 17.4  . Smokeless tobacco: Never Used  Substance Use Topics  . Alcohol use: Yes    Alcohol/week: 0.0 standard drinks    Comment: rare  . Drug use: No    FAMILY HISTORY: Family History  Problem Relation Age of Onset  . Cancer Mother        squamous cell carcinoma  . Hyperlipidemia Mother   . Depression Mother   . Anxiety disorder Mother   . Obesity Mother   . Heart disease Father 22       cardiomegaly, CHF; steroid use.  [name] Hyperlipidemia Father   . Hypertension Father   . Mental retardation Father   . High blood pressure Father   . Depression Father   . Anxiety disorder Father   . Obesity Father   . Cancer Maternal Grandmother   . Diabetes Maternal Grandmother   . Heart disease Maternal Grandmother   . Hyperlipidemia Maternal Grandmother   . Hypertension Maternal Grandmother   . Stroke Maternal Grandmother   . Heart disease Paternal Grandmother   . Hypertension Paternal Grandmother   . Heart disease Paternal Grandfather   . Hyperlipidemia Paternal Grandfather   . Mental illness Paternal Grandfather   . Rheum arthritis Sister   . Breast cancer Maternal Aunt   . Thyroid cancer Paternal Aunt     ROS: Review of Systems  Constitutional: Positive for malaise/fatigue. Negative for weight loss.  Gastrointestinal: Negative for nausea and vomiting.  Musculoskeletal:       Negative muscle weakness  Endo/Heme/Allergies:       Negative hypoglycemia    PHYSICAL EXAM: Pt in no acute distress  RECENT LABS AND TESTS: BMET    Component Value Date/Time   NA 135 11/10/2018 0657   NA 138 10/10/2018 0829   K 4.4 11/10/2018 0657   CL 106 11/10/2018 0657   CO2 21 (L) 11/10/2018 0657   GLUCOSE 94 11/10/2018 0657   BUN 13 11/10/2018 0657   BUN 12  10/10/2018 0829   CREATININE 0.94 11/10/2018 0657   CREATININE 0.95 06/27/2016 0933   CALCIUM 8.6 (L) 11/10/2018  [place] >60 11/10/2018 0657   GFRAA >60 11/10/2018 0657   Lab Results  Component Value Date   HGBA1C 5.2 10/10/2018   HGBA1C 5.3 05/25/2018   HGBA1C 5.4 02/10/2018   HGBA1C 5.5 10/07/2017   HGBA1C 5.1 06/22/2017   Lab Results  Component Value Date   INSULIN 21.4 10/10/2018   INSULIN 87.4 (H) 05/25/2018   INSULIN 20.3 02/10/2018   INSULIN 15.6 10/07/2017   INSULIN 25.2 (H) 06/22/2017   CBC    Component Value Date/Time   WBC 5.5 01/05/2018 0915   RBC 4.02 01/05/2018 0915   HGB 13.3 09/12/2018 1636   HGB 11.2 06/22/2017 0904   HCT 39.0 09/12/2018 1636   HCT 34.8 06/22/2017 0904   PLT 285 01/05/2018 0915   PLT 332 11/12/2016 1651   MCV 88.1 01/05/2018 0915   MCV 86 06/22/2017 0904   MCH 27.6 01/05/2018 0915   MCHC 31.4 01/05/2018 0915   RDW 14.4 01/05/2018 0915   RDW 15.7 (H) 06/22/2017 0904   LYMPHSABS 1.5 06/22/2017 0904   MONOABS 891 04/30/2016 0947   EOSABS 0.2 06/22/2017 0904   BASOSABS 0.0 06/22/2017 0904   Iron/TIBC/Ferritin/ %Sat    Component Value Date/Time   IRON 53 08/31/2016 1644   TIBC 280 08/31/2016 1644   FERRITIN 36 08/31/2016 1644   IRONPCTSAT 19 08/31/2016 1644   Lipid Panel     Component Value Date/Time   CHOL 155 10/10/2018 0829   TRIG 173 (H) 10/10/2018 0829   HDL 40 10/10/2018 0829   CHOLHDL 3.5 02/10/2018 0749   CHOLHDL 4.1 12/01/2015 0914   VLDL 37 (H) 12/01/2015 0914   LDLCALC 80 10/10/2018 0829   Hepatic Function Panel     Component Value Date/Time   PROT 6.9 10/10/2018 0829   ALBUMIN 3.9 10/10/2018 0829   AST 20 10/10/2018 0829   ALT 18 10/10/2018 0829   ALKPHOS 84 10/10/2018 0829   BILITOT <0.2 10/10/2018 0829      Component Value Date/Time   TSH 1.620 08/31/2016 1644   TSH 1.83 12/01/2015 0914      I, Trixie Dredge, am acting as transcriptionist for Ilene Qua, MD  I have reviewed  the above documentation for accuracy and completeness, and I agree with the above. - Ilene Qua, MD

## 2019-03-06 NOTE — Progress Notes (Signed)
E-Visit for Corona Virus Screening   Your current symptoms could be consistent with the coronavirus.  Call your health care provider or local health department to request and arrange formal testing. Many health care providers can now test patients at their office but not all are.  Please quarantine yourself while awaiting your test results.  [facility] [place] [phone], [place], [place] [phone] or visit [email or web address]  and You have been enrolled in [facility] for COVID-19.  Daily you will receive a questionnaire within the [facility] website. Our COVID-19 response team will be monitoring your responses daily.      COVID-19 is a respiratory illness with symptoms that are similar to the flu. Symptoms are typically mild to moderate, but there have been cases of severe illness and death due to the virus. The following symptoms may appear 2-14 days after exposure: . Fever . Cough . Shortness of breath or difficulty breathing . Chills . Repeated shaking with chills . Muscle pain . Headache . Sore throat . New loss of taste or smell . Fatigue . Congestion or runny nose . Nausea or vomiting . Diarrhea    Ms. Ellenberger, Your sore throat and mild fever and changes in taste (which you have indicated in the previous Evisit) may be related to [place]. I recommend testing for the virus as indicated below. Sore throat and mild fever may also be related to strep throat and you would be at high risk of it if you have been in contact with anyone with Strep throat. I have provided an antibiotic prescription for Azithromycin 250 mg, take two pills today, and take one pill days 2-5, if your sore throat persist you may start the prescribed antibiotic. I have also provided a work note for you.     It is vitally important that if you feel that you have an  infection such as this virus or any other virus that you stay home and away from places where you may spread it to others.  You should self-quarantine for 14 days if you have symptoms that could potentially be coronavirus or have been in close contact a with a person diagnosed with COVID-19 within the last 2 weeks. You should avoid contact with people age 68 and older.   You should wear a mask or cloth face covering over your nose and mouth if you must be around other people or animals, including pets (even at home). Try to stay at least 6 feet away from other people. This will protect the people around you.   You may also take acetaminophen (Tylenol) as needed for fever.   Reduce your risk of any infection by using the same precautions used for avoiding the common cold or flu:  [name] Wash your hands often with soap and warm water for at least 20 seconds.  If soap and water are not readily available, use an alcohol-based hand sanitizer with at least 60% alcohol.  . If coughing or sneezing, cover your mouth and nose by coughing or sneezing into the elbow areas of your shirt or coat, into a tissue or into your sleeve (not your hands). . Avoid shaking hands with others and consider head nods or verbal greetings only. . Avoid touching your eyes, nose, or mouth with unwashed hands.  . Avoid close contact with people who are sick. . Avoid places or events with large numbers of people in one location, like concerts or sporting events. . Carefully  consider travel plans you have or are making. . If you are planning any travel outside or inside the [name], visit the CDC's Travelers' Health webpage for the latest health notices. . If you have some symptoms but not all symptoms, continue to monitor at home and seek medical attention if your symptoms worsen. . If you are having a medical emergency, call 911.  HOME CARE . Only take medications as instructed by your medical team. . Drink plenty of fluids and get  plenty of rest. . A steam or ultrasonic humidifier can help if you have congestion.   GET HELP RIGHT AWAY IF YOU HAVE EMERGENCY WARNING SIGNS** FOR COVID-19. If you or someone is showing any of these signs seek emergency medical care immediately. Call 911 or proceed to your closest emergency facility if: . You develop worsening high fever. . Trouble breathing . Bluish lips or face . Persistent pain or pressure in the chest . New confusion . Inability to wake or stay awake . You cough up blood. . Your symptoms become more severe  **This list is not all possible symptoms. Contact your medical provider for any symptoms that are sever or concerning to you.   MAKE SURE YOU   Understand these instructions.  Will watch your condition.  Will get help right away if you are not doing well or get worse.  Your e-visit answers were reviewed by a board certified advanced clinical practitioner to complete your personal care plan.  Depending on the condition, your plan could have included both over the counter or prescription medications.  If there is a problem please reply once you have received a response from your provider.  Your safety is important to [name].  If you have drug allergies check your prescription carefully.    You can use MyChart to ask questions about today's visit, request a non-urgent call back, or ask for a work or school excuse for 24 hours related to this e-Visit. If it has been greater than 24 hours you will need to follow up with your provider, or enter a new e-Visit to address those concerns. You will get an e-mail in the next two days asking about your experience.  I hope that your e-visit has been valuable and will speed your recovery. Thank you for using e-visits.   I spent 5-10 minutes on review and completion of this note- Lacy Duverney [facility]

## 2019-03-06 NOTE — Addendum Note (Signed)
Addended by: Waldon Merl on: 03/06/2019 04:03 PM   Modules accepted: Orders

## 2019-03-06 NOTE — Progress Notes (Signed)
E-Visit for Corona Virus Screening   You have been enrolled in [facility] for COVID-19.  Daily you will receive a questionnaire within the [facility] website. Our COVID-19 response team will be monitoring your responses daily.

## 2019-03-06 NOTE — Progress Notes (Signed)
I recommended that patient completes a Covid 19 questionnaire to assess the risk for Covid 19.  NOTE: If you entered your credit card information for this eVisit, you will not be charged. You may see a "hold" on your card for the $35 but that hold will drop off and you will not have a charge processed.  If you are having a true medical emergency please call 911.     For an urgent face to face visit, [facility] has five urgent care centers for your convenience:    [email or web address] to reserve your spot online an avoid wait times  [facility] [address], [address] [place], [place] [address] Modified hours of operation: Monday-Friday, 12 PM to 6 PM  Closed Saturday & Sunday  *Across the street from [facility] (New Address!) [address], [place], [place] [address] *Just off [address], across the road from [place] hours of operation: Monday-Friday, 12 PM to 6 PM  Closed Saturday & Sunday   The following sites will take your insurance:  . [facility]    [phone]                  Get Driving Directions  [address], [place] [address] . 10 am to 8 pm Monday-Friday . 12 pm to 8 pm Saturday-Sunday   . [facility] at [place]                  Get Driving Directions  [address], [place], [place] [address] . 8 am to 8 pm Monday-Friday . 9 am to 6 pm Saturday . 11 am to 6 pm Sunday   . [facility] at [place]                  Get Driving Directions   [address] [place], [place] [address] . 8 am to 8 pm Monday-Friday . 8 am to 4 pm Saturday-Sunday    . [facility] at [place]                    Get Driving Directions  [phone]  [address] [place], [place] [address]  . Monday-Friday, 12 PM to 6 PM    Your e-visit  answers were reviewed by a board certified advanced clinical practitioner to complete your personal care plan.  Thank you for using e-Visits. I spent 5-10 minutes on review and completion of this note- Lacy Duverney [facility]

## 2019-03-06 NOTE — Telephone Encounter (Signed)
Incoming call from Pt. Who states that her provider  Cathleen Corti)  wants her to be  Tested for Covid-19)  Pt.  scheduled for  Later today@ 2:30pm  Pt.  Voices understanding.

## 2019-03-08 DIAGNOSIS — F4310 Post-traumatic stress disorder, unspecified: Secondary | ICD-10-CM | POA: Diagnosis not present

## 2019-03-09 DIAGNOSIS — L813 Cafe au lait spots: Secondary | ICD-10-CM | POA: Diagnosis not present

## 2019-03-09 DIAGNOSIS — L821 Other seborrheic keratosis: Secondary | ICD-10-CM | POA: Diagnosis not present

## 2019-03-09 LAB — NOVEL CORONAVIRUS, NAA: SARS-CoV-2, NAA: NOT DETECTED

## 2019-03-14 ENCOUNTER — Other Ambulatory Visit: Payer: Self-pay | Admitting: Physician Assistant

## 2019-03-14 MED FILL — PANTOPRAZOLE SOD DR 40 MG T: 40 | 30 days supply | Qty: 30 | Fill #0

## 2019-03-14 MED FILL — MECLIZINE 25 MG TABLET: 25 | 20 days supply | Qty: 60 | Fill #1

## 2019-03-14 MED FILL — clonazePAM 0.5 MG TABS: 0.5 | 30 days supply | Qty: 120 | Fill #1

## 2019-03-15 ENCOUNTER — Ambulatory Visit (INDEPENDENT_AMBULATORY_CARE_PROVIDER_SITE_OTHER): Payer: 59 | Admitting: Family Medicine

## 2019-03-15 ENCOUNTER — Other Ambulatory Visit: Payer: Self-pay

## 2019-03-15 VITALS — BP 136/84 | HR 85 | Temp 98.0°F | Ht 65.0 in | Wt 280.0 lb

## 2019-03-15 DIAGNOSIS — Z6841 Body Mass Index (BMI) 40.0 and over, adult: Secondary | ICD-10-CM

## 2019-03-15 DIAGNOSIS — R7303 Prediabetes: Secondary | ICD-10-CM

## 2019-03-15 DIAGNOSIS — E781 Pure hyperglyceridemia: Secondary | ICD-10-CM | POA: Diagnosis not present

## 2019-03-15 DIAGNOSIS — Z9189 Other specified personal risk factors, not elsewhere classified: Secondary | ICD-10-CM | POA: Diagnosis not present

## 2019-03-15 DIAGNOSIS — F4310 Post-traumatic stress disorder, unspecified: Secondary | ICD-10-CM | POA: Diagnosis not present

## 2019-03-15 MED FILL — PRAZOSIN 1 MG CAPSULE: 1 | 90 days supply | Qty: 90 | Fill #0

## 2019-03-15 MED FILL — PRAZOSIN 2 MG CAPSULE: 2 | 90 days supply | Qty: 180 | Fill #0

## 2019-03-15 MED FILL — PROPRANOLOL 40 MG TABLET: 40 | 90 days supply | Qty: 180 | Fill #0

## 2019-03-15 MED FILL — QUETIAPINE FUMARATE 200 MG: 200 | 90 days supply | Qty: 135 | Fill #0

## 2019-03-16 NOTE — Progress Notes (Signed)
Office: [phone]  /  Fax: [phone]   HPI:   Chief Complaint: OBESITY Caroline Gonzales is here to discuss her progress with her obesity treatment plan. She is on the portion control better and make smarter food choices, such as increase vegetables and decrease simple carbohydrates and is following her eating plan approximately 50 % of the time. She states she is walking for 20 minutes 5 times per week. Caroline Gonzales has been busy at work with still having increased census. She has a lot of familial stress at home, and she is not sleeping well and experiencing some binge eating episodes.  Her weight is 280 lb (127 kg) today and has gained 17 lbs since her last visit. She has lost 0 lbs since starting treatment with [name].  Pre-Diabetes Caroline Gonzales has a diagnosis of pre-diabetes based on her elevated Hgb A1c and was informed this puts her at greater risk of developing diabetes. She is on metformin and Victoza, and she denies GI side effects. She is still experiencing significant carbohydrate cravings. She continues to work on diet and exercise to decrease risk of diabetes. She denies hypoglycemia.  Hypertriglyceridemia Caroline Gonzales's triglycerides was of 173 in January. She is not on statin and has a history of pre-diabetes.  At risk for cardiovascular disease Caroline Gonzales is at a higher than average risk for cardiovascular disease due to obesity and hypertriglyceridemia. She currently denies any chest pain.  ASSESSMENT AND PLAN:  Prediabetes - Plan: Insulin, random, Hemoglobin A1c, Comprehensive metabolic panel, P54  Hypertriglyceridemia - Plan: Lipid Panel With LDL/HDL Ratio  At risk for heart disease  Class 3 severe obesity with serious comorbidity and body mass index (BMI) of 45.0 to 49.9 in adult, unspecified obesity type Caroline Gonzales)  PLAN:  Pre-Diabetes Caroline Gonzales will continue to work on weight loss, exercise, and decreasing simple carbohydrates in her diet to help decrease the risk of diabetes. We dicussed  metformin including benefits and risks. She was informed that eating too many simple carbohydrates or too many calories at one sitting increases the likelihood of GI side effects. [name] agrees to continue taking her medications, and we will check Hgb A1c, insulin, CMP, and Vit B12 today. [name] agrees to follow up with our clinic in 3 weeks as directed to monitor her progress.  Hypertriglyceridemia We will check FLP today. Caroline Gonzales agrees to follow up with our clinic in 3 weeks.  Cardiovascular risk counseling Caroline Gonzales was given extended (15 minutes) coronary artery disease prevention counseling today. She is 43 y.o. female and has risk factors for heart disease including obesity and hypertriglyceridemia. We discussed intensive lifestyle modifications today with an emphasis on specific weight loss instructions and strategies. Pt was also informed of the importance of increasing exercise and decreasing saturated fats to help prevent heart disease.  Obesity Caroline Gonzales is currently in the action stage of change. As such, her goal is to continue with weight loss efforts She has agreed to portion control better and make smarter food choices, such as increase vegetables and decrease simple carbohydrates   Caroline Gonzales wants to start making better choices at work for lunch. Caroline Gonzales has been instructed to work up to a goal of 150 minutes of combined cardio and strengthening exercise per week for weight loss and overall health benefits. We discussed the following Behavioral Modification Strategies today: increasing lean protein intake, increasing vegetables and work on meal planning and easy cooking plans   Caroline Gonzales has agreed to follow up with our clinic in 3 weeks. She was informed of the importance  of frequent follow up visits to maximize her success with intensive lifestyle modifications for her multiple health conditions.  ALLERGIES: Allergies  Allergen Reactions   Penicillins Shortness Of Breath and Rash     Did it involve swelling of the face/tongue/throat, SOB, or low BP? Yes Did it involve sudden or severe rash/hives, skin peeling, or any reaction on the inside of your mouth or nose? Yes Did you need to seek medical attention at a hospital or doctor's office? Yes When did it last happen?childhood allergy If all above answers are NO, may proceed with cephalosporin use.     Tamiflu [Oseltamivir Phosphate] Anaphylaxis   Cephalosporins Rash   Latex Rash    MEDICATIONS: Current Outpatient Medications on File Prior to Visit  Medication Sig Dispense Refill   Cholecalciferol (VITAMIN D3) 5000 units CAPS Take 5,000 Units by mouth at bedtime.     clonazePAM (KLONOPIN) 0.5 MG tablet TAKE 1 TABLET BY MOUTH EVERY 6 HOURS AS NEEDED FOR ANXIETY 120 tablet 1   fexofenadine (ALLEGRA) 180 MG tablet Take 180 mg by mouth at bedtime.     ibuprofen (ADVIL,MOTRIN) 200 MG tablet Take 400 mg by mouth daily as needed for headache or moderate pain.     L-Methylfolate-Algae (DEPLIN 15) 15-90.314 MG CAPS Take 1 capsule by mouth at bedtime.      lamoTRIgine (LAMICTAL) 150 MG tablet TAKE 1 TABLET BY MOUTH TWICE DAILY 180 tablet 0   levonorgestrel (MIRENA) 20 MCG/24HR IUD 1 each by Intrauterine route once.      meclizine (ANTIVERT) 25 MG tablet Take 1 tablet (25 mg total) by mouth 3 (three) times daily as needed for dizziness. 60 tablet 1   metFORMIN (GLUCOPHAGE) 1000 MG tablet Take 1 tablet (1,000 mg total) by mouth daily with breakfast. 30 tablet 0   Multiple Vitamin (MULTIVITAMIN) tablet Take 1 tablet by mouth at bedtime.      pantoprazole (PROTONIX) 40 MG tablet Take 40 mg by mouth at bedtime.      prazosin (MINIPRESS) 1 MG capsule TAKE 1 CAPSULE (1 MG TOTAL) BY MOUTH AT BEDTIME. ADD TO THE 4 MG YOU'RE ALREADY TAKING TO =5 MG. 90 capsule 0   prazosin (MINIPRESS) 2 MG capsule TAKE 2 CAPSULES BY MOUTH AT BEDTIME. 180 capsule 0   propranolol (INDERAL) 40 MG tablet TAKE 1 TABLET BY MOUTH 2  TIMES DAILY. 180 tablet 0   QUEtiapine (SEROQUEL) 200 MG tablet Take 1.5 tablets (300 mg total) by mouth at bedtime. 135 tablet 0   trimethoprim-polymyxin b (POLYTRIM) ophthalmic solution Place 2 drops into both eyes every 4 (four) hours. 10 mL 0   albuterol (PROVENTIL HFA;VENTOLIN HFA) 108 (90 Base) MCG/ACT inhaler Inhale 2 puffs into the lungs every 4 (four) hours as needed for wheezing or shortness of breath. (Patient not taking: Reported on 03/15/2019) 1 Inhaler 1   azithromycin (ZITHROMAX) 250 MG tablet Take 2 pills today. Take 1 pill days 2-5 (Patient not taking: Reported on 03/15/2019) 6 tablet 0   No current facility-administered medications on file prior to visit.     PAST MEDICAL HISTORY: Past Medical History:  Diagnosis Date   Allergy    Zyrtec, Allegra.   Anxiety    followed by Dr. Toy Cookey   Back pain    Bipolar 1 disorder [facility])    Complication of anesthesia    Depression    Fibroid    Gallbladder problem    Gastroparesis 09/14/2012   gastric emptying study in 2014   GERD (gastroesophageal reflux  disease)    Headache    Pneumonia    2013ish   PONV (postoperative nausea and vomiting)    Pre-diabetes    PTSD (post-traumatic stress disorder)    Sleep apnea    do not use a cpap   Tinnitus    Vitamin D deficiency    Wears glasses     PAST SURGICAL HISTORY: Past Surgical History:  Procedure Laterality Date   ANKLE ARTHROSCOPY Left 2011   CESAREAN SECTION     CHOLECYSTECTOMY     DILATION AND CURETTAGE OF UTERUS     FOREIGN BODY REMOVAL Left 11/18/2016   Procedure: REMOVAL FOREIGN BODY EXTREMITY LEFT FOOT;  Surgeon: Trula Slade, DPM;  Location: [facility];  Service: Podiatry;  Laterality: Left;   PILONIDAL CYST EXCISION  1990's   RADIOLOGY WITH ANESTHESIA N/A 11/10/2018   Procedure: MRI WITH ANESTHESIA OF BRAIN AND ORBITS WITH AND WITHOUT CONTRAST;  Surgeon: Radiologist, Medication, MD;  Location: [facility];  Service: Radiology;  Laterality: N/A;     TENDON REPAIR Left 2011   Left Ankle   WISDOM TOOTH EXTRACTION  19090's    SOCIAL HISTORY: Social History   Tobacco Use   Smoking status: Former Smoker    Years: 10.00    Types: Cigarettes    Quit date: 2003    Years since quitting: 17.5   Smokeless tobacco: Never Used  Substance Use Topics   Alcohol use: Yes    Alcohol/week: 0.0 standard drinks    Comment: rare   Drug use: No    FAMILY HISTORY: Family History  Problem Relation Age of Onset   Cancer Mother        squamous cell carcinoma   Hyperlipidemia Mother    Depression Mother    Anxiety disorder Mother    Obesity Mother    Heart disease Father 29       cardiomegaly, CHF; steroid use.   Hyperlipidemia Father    Hypertension Father    Mental retardation Father    High blood pressure Father    Depression Father    Anxiety disorder Father    Obesity Father    Cancer Maternal Grandmother    Diabetes Maternal Grandmother    Heart disease Maternal Grandmother    Hyperlipidemia Maternal Grandmother    Hypertension Maternal Grandmother    Stroke Maternal Grandmother    Heart disease Paternal Grandmother    Hypertension Paternal Grandmother    Heart disease Paternal Grandfather    Hyperlipidemia Paternal Grandfather    Mental illness Paternal Grandfather    Rheum arthritis Sister    Breast cancer Maternal Aunt    Thyroid cancer Paternal Aunt     ROS: Review of Systems  Constitutional: Negative for weight loss.  Cardiovascular: Negative for chest pain.  Endo/Heme/Allergies:       Negative hypoglycemia    PHYSICAL EXAM: Blood pressure 136/84, pulse 85, temperature 98 F (36.7 C), temperature source Oral, height 5\' 5"  (1.651 m), weight 280 lb (127 kg), SpO2 98 %. Body mass index is 46.59 kg/m. Physical Exam Vitals signs reviewed.  Constitutional:      Appearance: Normal appearance. She is obese.  Cardiovascular:     Rate and Rhythm: Normal rate.     Pulses: Normal  pulses.  Pulmonary:     Effort: Pulmonary effort is normal.     Breath sounds: Normal breath sounds.  Musculoskeletal: Normal range of motion.  Skin:    General: Skin is warm and dry.  Neurological:  Mental Status: She is alert and oriented to person, place, and time.  Psychiatric:        Mood and Affect: Mood normal.        Behavior: Behavior normal.     RECENT LABS AND TESTS: BMET    Component Value Date/Time   NA 135 11/10/2018 0657   NA 138 10/10/2018 0829   K 4.4 11/10/2018 0657   CL 106 11/10/2018 0657   CO2 21 (L) 11/10/2018 0657   GLUCOSE 94 11/10/2018 0657   BUN 13 11/10/2018 0657   BUN 12 10/10/2018 0829   CREATININE 0.94 11/10/2018 0657   CREATININE 0.95 06/27/2016 0933   CALCIUM 8.6 (L) 11/10/2018 0657   GFRNONAA >60 11/10/2018 0657   GFRAA >60 11/10/2018 0657   Lab Results  Component Value Date   HGBA1C 5.2 10/10/2018   HGBA1C 5.3 05/25/2018   HGBA1C 5.4 02/10/2018   HGBA1C 5.5 10/07/2017   HGBA1C 5.1 06/22/2017   Lab Results  Component Value Date   INSULIN 21.4 10/10/2018   INSULIN 87.4 (H) 05/25/2018   INSULIN 20.3 02/10/2018   INSULIN 15.6 10/07/2017   INSULIN 25.2 (H) 06/22/2017   CBC    Component Value Date/Time   WBC 5.5 01/05/2018 0915   RBC 4.02 01/05/2018 0915   HGB 13.3 09/12/2018 1636   HGB 11.2 06/22/2017 0904   HCT 39.0 09/12/2018 1636   HCT 34.8 06/22/2017 0904   PLT 285 01/05/2018 0915   PLT 332 11/12/2016 1651   MCV 88.1 01/05/2018 0915   MCV 86 06/22/2017 0904   MCH 27.6 01/05/2018 0915   MCHC 31.4 01/05/2018 0915   RDW 14.4 01/05/2018 0915   RDW 15.7 (H) 06/22/2017 0904   LYMPHSABS 1.5 06/22/2017 0904   MONOABS 891 04/30/2016 0947   EOSABS 0.2 06/22/2017 0904   BASOSABS 0.0 06/22/2017 0904   Iron/TIBC/Ferritin/ %Sat    Component Value Date/Time   IRON 53 08/31/2016 1644   TIBC 280 08/31/2016 1644   FERRITIN 36 08/31/2016 1644   IRONPCTSAT 19 08/31/2016 1644   Lipid Panel     Component Value Date/Time    CHOL 155 10/10/2018 0829   TRIG 173 (H) 10/10/2018 0829   HDL 40 10/10/2018 0829   CHOLHDL 3.5 02/10/2018 0749   CHOLHDL 4.1 12/01/2015 0914   VLDL 37 (H) 12/01/2015 0914   LDLCALC 80 10/10/2018 0829   Hepatic Function Panel     Component Value Date/Time   PROT 6.9 10/10/2018 0829   ALBUMIN 3.9 10/10/2018 0829   AST 20 10/10/2018 0829   ALT 18 10/10/2018 0829   ALKPHOS 84 10/10/2018 0829   BILITOT <0.2 10/10/2018 0829      Component Value Date/Time   TSH 1.620 08/31/2016 1644   TSH 1.83 12/01/2015 0914      OBESITY BEHAVIORAL INTERVENTION VISIT  Today's visit was # 7   Starting weight: 278 lbs Starting date: 08/31/16 Today's weight : 280 lbs Today's date: 03/15/2019 Total lbs lost to date: 0    ASK: We discussed the diagnosis of obesity with Caroline Gonzales today and Caroline Gonzales agreed to give [name] permission to discuss obesity behavioral modification therapy today.  ASSESS: Caroline Gonzales has the diagnosis of obesity and her BMI today is 46.59 Caroline Gonzales is in the action stage of change   ADVISE: Caroline Gonzales was educated on the multiple health risks of obesity as well as the benefit of weight loss to improve her health. She was advised of the need for long term treatment and  the importance of lifestyle modifications to improve her current health and to decrease her risk of future health problems.  AGREE: Multiple dietary modification options and treatment options were discussed and  Caroline Gonzales agreed to follow the recommendations documented in the above note.  ARRANGE: Caroline Gonzales was educated on the importance of frequent visits to treat obesity as outlined per CMS and USPSTF guidelines and agreed to schedule her next follow up appointment today.  I, Trixie Dredge, am acting as transcriptionist for Ilene Qua, MD  I have reviewed the above documentation for accuracy and completeness, and I agree with the above. - Ilene Qua, MD

## 2019-03-20 ENCOUNTER — Encounter (INDEPENDENT_AMBULATORY_CARE_PROVIDER_SITE_OTHER): Payer: Self-pay | Admitting: Family Medicine

## 2019-03-21 ENCOUNTER — Ambulatory Visit (INDEPENDENT_AMBULATORY_CARE_PROVIDER_SITE_OTHER): Payer: 59 | Admitting: Physician Assistant

## 2019-03-21 ENCOUNTER — Other Ambulatory Visit: Payer: Self-pay

## 2019-03-21 ENCOUNTER — Encounter: Payer: Self-pay | Admitting: Physician Assistant

## 2019-03-21 DIAGNOSIS — F411 Generalized anxiety disorder: Secondary | ICD-10-CM | POA: Diagnosis not present

## 2019-03-21 DIAGNOSIS — G4700 Insomnia, unspecified: Secondary | ICD-10-CM | POA: Diagnosis not present

## 2019-03-21 DIAGNOSIS — F4310 Post-traumatic stress disorder, unspecified: Secondary | ICD-10-CM | POA: Diagnosis not present

## 2019-03-21 DIAGNOSIS — F331 Major depressive disorder, recurrent, moderate: Secondary | ICD-10-CM | POA: Diagnosis not present

## 2019-03-21 MED ORDER — GABAPENTIN 300 MG PO CAPS: 300.0000 mg | ORAL_CAPSULE | Freq: Every day | ORAL | 0 refills | Status: DC

## 2019-03-21 MED FILL — GABAPENTIN 300 MG CAPSULE: 300 | 90 days supply | Qty: 90 | Fill #0

## 2019-03-21 NOTE — Progress Notes (Signed)
[facility] Check  Patient ID: Caroline Gonzales,  MRN: [id number]  PCP: Ronnald Nian, DO  Date of Evaluation: 03/21/2019 Time spent:15 minutes  Chief Complaint:  Chief Complaint    Follow-up      HISTORY/CURRENT STATUS: HPI for routine med check.  He has a lot going on so uncertain if the medications need to be changed at all or not.  She is going through some pretty intensive therapy and has been having more restless sleep.  She does not remember nightmares like she has had of demons and murders and things like that but her husband says she flails around in her sleep a lot.  She does not feel rested when she gets up.  The only way she is able to sleep is to take gabapentin, meclizine, and Klonopin.  She does not want to take all that every night.  She has only been taking the gabapentin as needed per pain management for her back.  She has not had any extreme sadness.  Because of the coronavirus pandemic and all the political and racial unrest, states it is difficult to know if her feelings of being unsettled are related to circumstances or biochemical issues.  She denies suicidal or homicidal thoughts.  She is working and has not missed days.  Her energy and motivation are pretty good.  The anxiety is mostly controlled.  She does not always need the Klonopin during the day.  She will only take it in the evenings to help herself settle down so that she can get some rest.  Denies dizziness, syncope, seizures, numbness, tingling, tremor, tics, unsteady gait, slurred speech, confusion. Denies muscle or joint pain, stiffness, or dystonia.  Individual Medical History/ Review of Systems: Changes? :No    Past medications for mental health diagnoses include: Prozac, Paxil, Effexor, Norpramin, Wellbutrin, doxepin, Elavil, Geodon, Abilify, Lexapro, Trileptal, Ambien, Lunesta, Sonata, trazodone, Restoril, Remeron, Seroquel, Klonopin, propranolol, Lamictal, Deplin, Sonata, Xanax made  her feel horrible, Ativan didn't help  Allergies: Penicillins, Tamiflu [oseltamivir phosphate], Cephalosporins, and Latex  Current Medications:  Current Outpatient Medications:  .  Cholecalciferol (VITAMIN D3) 5000 units CAPS, Take 5,000 Units by mouth at bedtime., Disp: , Rfl:  .  clonazePAM (KLONOPIN) 0.5 MG tablet, TAKE 1 TABLET BY MOUTH EVERY 6 HOURS AS NEEDED FOR ANXIETY, Disp: 120 tablet, Rfl: 1 .  fexofenadine (ALLEGRA) 180 MG tablet, Take 180 mg by mouth at bedtime., Disp: , Rfl:  .  gabapentin (NEURONTIN) 300 MG capsule, Take 300 mg by mouth at bedtime as needed., Disp: , Rfl:  .  ibuprofen (ADVIL,MOTRIN) 200 MG tablet, Take 400 mg by mouth daily as needed for headache or moderate pain., Disp: , Rfl:  .  L-Methylfolate-Algae (DEPLIN 15) 15-90.314 MG CAPS, Take 1 capsule by mouth at bedtime. , Disp: , Rfl:  .  lamoTRIgine (LAMICTAL) 150 MG tablet, TAKE 1 TABLET BY MOUTH TWICE DAILY, Disp: 180 tablet, Rfl: 0 .  levonorgestrel (MIRENA) 20 MCG/24HR IUD, 1 each by Intrauterine route once. , Disp: , Rfl:  .  meclizine (ANTIVERT) 25 MG tablet, Take 1 tablet (25 mg total) by mouth 3 (three) times daily as needed for dizziness., Disp: 60 tablet, Rfl: 1 .  metFORMIN (GLUCOPHAGE) 1000 MG tablet, Take 1 tablet (1,000 mg total) by mouth daily with breakfast., Disp: 30 tablet, Rfl: 0 .  Multiple Vitamin (MULTIVITAMIN) tablet, Take 1 tablet by mouth at bedtime. , Disp: , Rfl:  .  pantoprazole (PROTONIX) 40 MG tablet, Take 40 mg  by mouth at bedtime. , Disp: , Rfl:  .  prazosin (MINIPRESS) 1 MG capsule, TAKE 1 CAPSULE (1 MG TOTAL) BY MOUTH AT BEDTIME. ADD TO THE 4 MG YOU'RE ALREADY TAKING TO =5 MG., Disp: 90 capsule, Rfl: 0 .  prazosin (MINIPRESS) 2 MG capsule, TAKE 2 CAPSULES BY MOUTH AT BEDTIME., Disp: 180 capsule, Rfl: 0 .  propranolol (INDERAL) 40 MG tablet, TAKE 1 TABLET BY MOUTH 2 TIMES DAILY., Disp: 180 tablet, Rfl: 0 .  QUEtiapine (SEROQUEL) 200 MG tablet, Take 1.5 tablets (300 mg total) by  mouth at bedtime., Disp: 135 tablet, Rfl: 0 .  albuterol (PROVENTIL HFA;VENTOLIN HFA) 108 (90 Base) MCG/ACT inhaler, Inhale 2 puffs into the lungs every 4 (four) hours as needed for wheezing or shortness of breath. (Patient not taking: Reported on 03/15/2019), Disp: 1 Inhaler, Rfl: 1 .  azithromycin (ZITHROMAX) 250 MG tablet, Take 2 pills today. Take 1 pill days 2-5 (Patient not taking: Reported on 03/15/2019), Disp: 6 tablet, Rfl: 0 .  gabapentin (NEURONTIN) 300 MG capsule, Take 1 capsule (300 mg total) by mouth at bedtime., Disp: 90 capsule, Rfl: 0 .  trimethoprim-polymyxin b (POLYTRIM) ophthalmic solution, Place 2 drops into both eyes every 4 (four) hours. (Patient not taking: Reported on 03/21/2019), Disp: 10 mL, Rfl: 0 Medication Side Effects: none  Family Medical/ Social History: Changes? No  MENTAL HEALTH EXAM:  There were no vitals taken for this visit.There is no height or weight on file to calculate BMI.  General Appearance: Casual and Obese  Eye Contact:  Good  Speech:  Clear and Coherent  Volume:  Normal  Mood:  Euthymic  Affect:  Appropriate  Thought Process:  Goal Directed  Orientation:  Full (Time, Place, and Person)  Thought Content: Logical   Suicidal Thoughts:  No  Homicidal Thoughts:  No  Memory:  WNL  Judgement:  Good  Insight:  Good  Psychomotor Activity:  Normal  Concentration:  Concentration: Good  Recall:  Good  Fund of Knowledge: Good  Language: Good  Assets:  Desire for Improvement  ADL's:  Intact  Cognition: WNL  Prognosis:  Good    DIAGNOSES:    ICD-10-CM   1. Major depressive disorder, recurrent episode, moderate (HCC)  F33.1   2. Generalized anxiety disorder  F41.1   3. PTSD (post-traumatic stress disorder)  F43.10   4. Insomnia, unspecified type  G47.00     Receiving Psychotherapy: Yes    RECOMMENDATIONS:  We discussed the restless sleep that she get.  He feels like, and I agree, that some of what she is experiencing is normal think she has to  work through due to the intensive therapy she is having.  She would like to get more restful sleep however.  Since the gabapentin has been helpful when it is added in, we will have her take that nightly, not just as needed. Continue Klonopin 0.5 mg 4 times daily as needed. Start gabapentin 300 mg nightly, not as needed. Continue Lamictal 150 mg twice daily. Continue Deplin 15 mg daily. Continue prazosin 5 mg nightly. Continue propranolol 40 mg twice daily. Continue Seroquel 300 mg nightly. Continue psychotherapy  Donnal Moat, PA-C   This record has been created using [employer].  Chart creation errors have been sought, but may not always have been located and corrected. Such creation errors do not reflect on the standard of medical care.

## 2019-03-22 DIAGNOSIS — Z03818 Encounter for observation for suspected exposure to other biological agents ruled out: Secondary | ICD-10-CM | POA: Diagnosis not present

## 2019-03-22 DIAGNOSIS — F4310 Post-traumatic stress disorder, unspecified: Secondary | ICD-10-CM | POA: Diagnosis not present

## 2019-03-22 DIAGNOSIS — R7303 Prediabetes: Secondary | ICD-10-CM | POA: Diagnosis not present

## 2019-03-22 DIAGNOSIS — E781 Pure hyperglyceridemia: Secondary | ICD-10-CM | POA: Diagnosis not present

## 2019-03-23 LAB — HEMOGLOBIN A1C
Est. average glucose Bld gHb Est-mCnc: 120 mg/dL
Hgb A1c MFr Bld: 5.8 % — ABNORMAL HIGH (ref 4.8–5.6)

## 2019-03-23 LAB — LIPID PANEL WITH LDL/HDL RATIO
Cholesterol, Total: 157 mg/dL (ref 100–199)
HDL: 41 mg/dL (ref >39)
LDL Calculated: 70 mg/dL (ref 0–99)
LDl/HDL Ratio: 1.7 ratio (ref 0.0–3.2)
Triglycerides: 231 mg/dL — ABNORMAL HIGH (ref 0–149)
VLDL Cholesterol Cal: 46 mg/dL — ABNORMAL HIGH (ref 5–40)

## 2019-03-23 LAB — INSULIN, RANDOM: INSULIN: 36.2 u[IU]/mL — ABNORMAL HIGH (ref 2.6–24.9)

## 2019-03-29 DIAGNOSIS — F4310 Post-traumatic stress disorder, unspecified: Secondary | ICD-10-CM | POA: Diagnosis not present

## 2019-03-30 ENCOUNTER — Ambulatory Visit: Payer: 59 | Admitting: Obstetrics and Gynecology

## 2019-03-31 ENCOUNTER — Encounter: Payer: Self-pay | Admitting: Family Medicine

## 2019-04-04 DIAGNOSIS — M5136 Other intervertebral disc degeneration, lumbar region: Secondary | ICD-10-CM | POA: Diagnosis not present

## 2019-04-04 DIAGNOSIS — M5416 Radiculopathy, lumbar region: Secondary | ICD-10-CM | POA: Diagnosis not present

## 2019-04-05 ENCOUNTER — Encounter (INDEPENDENT_AMBULATORY_CARE_PROVIDER_SITE_OTHER): Payer: Self-pay | Admitting: Family Medicine

## 2019-04-05 ENCOUNTER — Other Ambulatory Visit: Payer: Self-pay

## 2019-04-05 ENCOUNTER — Ambulatory Visit (INDEPENDENT_AMBULATORY_CARE_PROVIDER_SITE_OTHER): Payer: 59 | Admitting: Family Medicine

## 2019-04-05 VITALS — BP 135/79 | HR 90 | Temp 98.1°F | Ht 65.0 in | Wt 284.0 lb

## 2019-04-05 DIAGNOSIS — Z6841 Body Mass Index (BMI) 40.0 and over, adult: Secondary | ICD-10-CM | POA: Diagnosis not present

## 2019-04-05 DIAGNOSIS — E781 Pure hyperglyceridemia: Secondary | ICD-10-CM | POA: Diagnosis not present

## 2019-04-05 DIAGNOSIS — Z9189 Other specified personal risk factors, not elsewhere classified: Secondary | ICD-10-CM

## 2019-04-05 DIAGNOSIS — R7303 Prediabetes: Secondary | ICD-10-CM | POA: Diagnosis not present

## 2019-04-05 DIAGNOSIS — F4310 Post-traumatic stress disorder, unspecified: Secondary | ICD-10-CM | POA: Diagnosis not present

## 2019-04-05 MED ORDER — METFORMIN HCL 1000 MG PO TABS: 1000.0000 mg | ORAL_TABLET | Freq: Every day | ORAL | 0 refills | Status: DC

## 2019-04-07 ENCOUNTER — Encounter (HOSPITAL_COMMUNITY): Payer: Self-pay | Admitting: Emergency Medicine

## 2019-04-07 ENCOUNTER — Emergency Department (HOSPITAL_COMMUNITY)
Admission: EM | Admit: 2019-04-07 | Discharge: 2019-04-07 | Disposition: A | Discharge status: Home / Self Care | Payer: 59 | Attending: Emergency Medicine | Admitting: Emergency Medicine

## 2019-04-07 ENCOUNTER — Other Ambulatory Visit: Payer: Self-pay | Admitting: Physician Assistant

## 2019-04-07 ENCOUNTER — Other Ambulatory Visit: Payer: Self-pay

## 2019-04-07 ENCOUNTER — Emergency Department (HOSPITAL_COMMUNITY): Payer: 59

## 2019-04-07 DIAGNOSIS — R109 Unspecified abdominal pain: Secondary | ICD-10-CM | POA: Insufficient documentation

## 2019-04-07 DIAGNOSIS — M545 Low back pain: Secondary | ICD-10-CM | POA: Diagnosis not present

## 2019-04-07 DIAGNOSIS — Z91040 Latex allergy status: Secondary | ICD-10-CM | POA: Insufficient documentation

## 2019-04-07 DIAGNOSIS — Z87891 Personal history of nicotine dependence: Secondary | ICD-10-CM | POA: Insufficient documentation

## 2019-04-07 DIAGNOSIS — R110 Nausea: Secondary | ICD-10-CM | POA: Diagnosis not present

## 2019-04-07 DIAGNOSIS — Z7984 Long term (current) use of oral hypoglycemic drugs: Secondary | ICD-10-CM | POA: Insufficient documentation

## 2019-04-07 DIAGNOSIS — Z79899 Other long term (current) drug therapy: Secondary | ICD-10-CM | POA: Diagnosis not present

## 2019-04-07 DIAGNOSIS — M546 Pain in thoracic spine: Secondary | ICD-10-CM | POA: Insufficient documentation

## 2019-04-07 LAB — CBC WITH DIFFERENTIAL/PLATELET
Abs Immature Granulocytes: 0.03 10*3/uL (ref 0.00–0.07)
Basophils Absolute: 0 10*3/uL (ref 0.0–0.1)
Basophils Relative: 0 %
Eosinophils Absolute: 0.2 10*3/uL (ref 0.0–0.5)
Eosinophils Relative: 2 %
HCT: 37.1 % (ref 36.0–46.0)
Hemoglobin: 11.7 g/dL — ABNORMAL LOW (ref 12.0–15.0)
Immature Granulocytes: 0 %
Lymphocytes Relative: 24 %
Lymphs Abs: 2 10*3/uL (ref 0.7–4.0)
MCH: 28.7 pg (ref 26.0–34.0)
MCHC: 31.5 g/dL (ref 30.0–36.0)
MCV: 91.2 fL (ref 80.0–100.0)
Monocytes Absolute: 0.8 10*3/uL (ref 0.1–1.0)
Monocytes Relative: 9 %
Neutro Abs: 5.4 10*3/uL (ref 1.7–7.7)
Neutrophils Relative %: 65 %
Platelets: 253 10*3/uL (ref 150–400)
RBC: 4.07 MIL/uL (ref 3.87–5.11)
RDW: 14.7 % (ref 11.5–15.5)
WBC: 8.3 10*3/uL (ref 4.0–10.5)
nRBC: 0 % (ref 0.0–0.2)

## 2019-04-07 LAB — URINALYSIS, ROUTINE W REFLEX MICROSCOPIC
Bilirubin Urine: NEGATIVE
Glucose, UA: NEGATIVE mg/dL
Hgb urine dipstick: NEGATIVE
Ketones, ur: NEGATIVE mg/dL
Leukocytes,Ua: NEGATIVE
Nitrite: NEGATIVE
Protein, ur: NEGATIVE mg/dL
Specific Gravity, Urine: 1.021 (ref 1.005–1.030)
pH: 5 (ref 5.0–8.0)

## 2019-04-07 LAB — COMPREHENSIVE METABOLIC PANEL
ALT: 19 U/L (ref 0–44)
AST: 19 U/L (ref 15–41)
Albumin: 3.4 g/dL — ABNORMAL LOW (ref 3.5–5.0)
Alkaline Phosphatase: 77 U/L (ref 38–126)
Anion gap: 10 (ref 5–15)
BUN: 14 mg/dL (ref 6–20)
CO2: 23 mmol/L (ref 22–32)
Calcium: 8.7 mg/dL — ABNORMAL LOW (ref 8.9–10.3)
Chloride: 102 mmol/L (ref 98–111)
Creatinine, Ser: 1 mg/dL (ref 0.44–1.00)
GFR calc Af Amer: >60 mL/min (ref >60)
GFR calc non Af Amer: >60 mL/min (ref >60)
Glucose, Bld: 109 mg/dL — ABNORMAL HIGH (ref 70–99)
Potassium: 4 mmol/L (ref 3.5–5.1)
Sodium: 135 mmol/L (ref 135–145)
Total Bilirubin: 0.2 mg/dL — ABNORMAL LOW (ref 0.3–1.2)
Total Protein: 7.3 g/dL (ref 6.5–8.1)

## 2019-04-07 LAB — LIPASE, BLOOD: Lipase: 43 U/L (ref 11–51)

## 2019-04-07 LAB — PREGNANCY, URINE: Preg Test, Ur: NEGATIVE

## 2019-04-07 MED ORDER — MORPHINE SULFATE (PF) 4 MG/ML IV SOLN
4.0000 mg | Freq: Once | INTRAVENOUS | Status: AC
  Administered 2019-04-07: 4 mg via INTRAVENOUS
  Filled 2019-04-07: qty 1

## 2019-04-07 MED ORDER — LIDOCAINE 5 % EX PTCH
1.0000 | MEDICATED_PATCH | CUTANEOUS | Status: DC
  Administered 2019-04-07: 1 via TRANSDERMAL
  Filled 2019-04-07: qty 1

## 2019-04-07 MED ORDER — METHOCARBAMOL 500 MG PO TABS
500.0000 mg | ORAL_TABLET | Freq: Once | ORAL | Status: AC
  Administered 2019-04-07: 500 mg via ORAL
  Filled 2019-04-07: qty 1

## 2019-04-07 MED ORDER — METHOCARBAMOL 500 MG PO TABS: 500.0000 mg | ORAL_TABLET | Freq: Two times a day (BID) | ORAL | 0 refills | Status: DC

## 2019-04-07 MED ORDER — MELOXICAM 7.5 MG PO TABS: 7.5000 mg | ORAL_TABLET | Freq: Every day | ORAL | 0 refills | Status: DC

## 2019-04-07 MED ORDER — KETOROLAC TROMETHAMINE 30 MG/ML IJ SOLN
30.0000 mg | Freq: Once | INTRAMUSCULAR | Status: AC
  Administered 2019-04-07: 30 mg via INTRAVENOUS
  Filled 2019-04-07: qty 1

## 2019-04-07 MED ORDER — ONDANSETRON HCL 4 MG/2ML IJ SOLN
4.0000 mg | Freq: Once | INTRAMUSCULAR | Status: AC
  Administered 2019-04-07: 4 mg via INTRAVENOUS
  Filled 2019-04-07: qty 2

## 2019-04-07 MED ORDER — LIDOCAINE 5 % EX PTCH: 1.0000 | MEDICATED_PATCH | CUTANEOUS | 0 refills | Status: DC

## 2019-04-07 MED FILL — METHOCARBAMOL 500 MG TABS: 500 | 10 days supply | Qty: 20 | Fill #0

## 2019-04-07 MED FILL — lamoTRIgine 150 MG TABS: 150 | 30 days supply | Qty: 60 | Fill #0

## 2019-04-07 MED FILL — LIDOCAINE PATCH 5%: 5 | 15 days supply | Qty: 15 | Fill #0

## 2019-04-07 MED FILL — MELOXICAM 7.5 MG TABLET: 7.5 | 20 days supply | Qty: 20 | Fill #0

## 2019-04-07 NOTE — ED Triage Notes (Signed)
Patient complaining of right left mid back pain. Patient states it started around 10 pm last night. Patient states she has used tiger balm and some tylenol for pain.

## 2019-04-07 NOTE — Discharge Instructions (Signed)
Take Mobic once daily for your pain.  You can alternate with Tylenol as prescribed over-the-counter, as needed for your pain.  Take Robaxin twice daily as needed for muscle pain and spasms.  Do not drive or operate machinery while taking this medication.  You can apply Lidoderm patch to the most painful area and replace every 12 hours.  Use ice and heat alternating 20 minutes on, 20 minutes off.  If your symptoms are not improving over the next few days, please follow-up with your doctor.  Please return emergency department if you develop any new or worsening symptoms.

## 2019-04-07 NOTE — ED Provider Notes (Signed)
[facility] DEPT Provider Note   CSN: [id number] Arrival date & time: 04/07/19  0458    History   Chief Complaint Chief Complaint  Patient presents with  . Back Pain    HPI Caroline Gonzales is a 43 y.o. female with a history of bipolar 1 disorder, PTSD, uterine fibroids, and anxiety who presents to the emergency department with a chief complaint of right flank pain.  The patient reports sudden onset of right flank pain at 10 PM.  She states the pain felt "as if it started it a small, pin point spot, then seemed to spread out."  She was concerned that she may have pulled a muscle so she took Tylenol and had her husband apply some Tiger balm to the area.  She reports that she attempted to go to sleep and had a difficult time getting comfortable and later began crying due to the intensity of the pain.  Pain is worse with positional changes.  She feels as if the pain is now radiating around her flank into her right upper abdomen.  She reports she is having some associated nausea secondary to the pain.  She denies fever, chills, shortness of breath, cough, chest pain, rash, vomiting, diarrhea, constipation, dysuria, hematuria, vaginal pain, bleeding, or discharge, or neck pain or stiffness.  She is concerned that she may have a kidney stone although she does not have a history of kidney stones.  No history of similar pain.  No recent falls, injuries, new activities or exercises.  Surgical history includes cholecystectomy.     The history is provided by the patient. No language interpreter was used.    Past Medical History:  Diagnosis Date  . Allergy    Zyrtec, Allegra.  . Anxiety    followed by Dr. Toy Cookey  . Back pain   . Bipolar 1 disorder ([place])   . Complication of anesthesia   . Depression   . Fibroid   . Gallbladder problem   . Gastroparesis 09/14/2012   gastric emptying study in 2014  . GERD (gastroesophageal reflux disease)   . Headache   .  Pneumonia    2013ish  . PONV (postoperative nausea and vomiting)   . Pre-diabetes   . PTSD (post-traumatic stress disorder)   . Sleep apnea    do not use a cpap  . Tinnitus   . Vitamin D deficiency   . Wears glasses     Patient Active Problem List   Diagnosis Date Noted  . PTSD (post-traumatic stress disorder) 07/21/2018  . GAD (generalized anxiety disorder) 07/21/2018  . Insomnia 07/21/2018  . MDD (major depressive disorder) 07/21/2018  . Nightmares 07/21/2018  . Insulin resistance 10/21/2017  . Other hyperlipidemia 10/07/2017  . Vitamin D deficiency 10/07/2017  . Bipolar depression ([place]) 04/27/2017  . Class 3 obesity with serious comorbidity and body mass index (BMI) of 40.0 to 44.9 in adult 04/27/2017  . Residual foreign body in soft tissue 11/12/2016  . Prediabetes 11/02/2016  . Morbid obesity ([place]) 10/15/2016  . Bipolar 1 disorder [facility])     Past Surgical History:  Procedure Laterality Date  . ANKLE ARTHROSCOPY Left 2011  . CESAREAN SECTION    . CHOLECYSTECTOMY    . DILATION AND CURETTAGE OF UTERUS    . FOREIGN BODY REMOVAL Left 11/18/2016   Procedure: REMOVAL FOREIGN BODY EXTREMITY LEFT FOOT;  Surgeon: Trula Slade, DPM;  Location: [place];  Service: Podiatry;  Laterality: Left;  . PILONIDAL CYST EXCISION  1990's  . RADIOLOGY WITH ANESTHESIA N/A 11/10/2018   Procedure: MRI WITH ANESTHESIA OF BRAIN AND ORBITS WITH AND WITHOUT CONTRAST;  Surgeon: Radiologist, Medication, MD;  Location: [place];  Service: Radiology;  Laterality: N/A;  . TENDON REPAIR Left 2011   Left Ankle  . WISDOM TOOTH EXTRACTION  19090's     OB History    Gravida  3   Para  1   Term  1   Preterm  0   AB  0   Living  0     SAB  0   TAB  0   Ectopic  0   Multiple  0   Live Births  0            Home Medications    Prior to Admission medications   Medication Sig Start Date End Date Taking? Authorizing Provider  albuterol (PROVENTIL HFA;VENTOLIN HFA) 108 (90 Base) MCG/ACT  inhaler Inhale 2 puffs into the lungs every 4 (four) hours as needed for wheezing or shortness of breath. Patient not taking: Reported on 04/05/2019 12/15/18   Letta Median K, DO  azithromycin (ZITHROMAX) 250 MG tablet Take 2 pills today. Take 1 pill days 2-5 Patient not taking: Reported on 04/05/2019 03/06/19   Waldon Merl, PA-C  Cholecalciferol (VITAMIN D3) 5000 units CAPS Take 5,000 Units by mouth at bedtime.    [provider]  clonazePAM (KLONOPIN) 0.5 MG tablet TAKE 1 TABLET BY MOUTH EVERY 6 HOURS AS NEEDED FOR ANXIETY 02/08/19   Hurst, Helene Kelp T, PA-C  fexofenadine (ALLEGRA) 180 MG tablet Take 180 mg by mouth at bedtime.    [provider]  gabapentin (NEURONTIN) 300 MG capsule Take 1 capsule (300 mg total) by mouth at bedtime. 03/21/19   Donnal Moat T, PA-C  ibuprofen (ADVIL,MOTRIN) 200 MG tablet Take 400 mg by mouth daily as needed for headache or moderate pain.    [provider]  L-Methylfolate-Algae (DEPLIN 15) 15-90.314 MG CAPS Take 1 capsule by mouth at bedtime.  05/25/18   [provider]  lamoTRIgine (LAMICTAL) 150 MG tablet TAKE 1 TABLET BY MOUTH TWICE DAILY 11/09/18   Donnal Moat T, PA-C  levonorgestrel (MIRENA) 20 MCG/24HR IUD 1 each by Intrauterine route once.     [provider]  meclizine (ANTIVERT) 25 MG tablet Take 1 tablet (25 mg total) by mouth 3 (three) times daily as needed for dizziness. 12/04/18   Ronnald Nian, DO  metFORMIN (GLUCOPHAGE) 1000 MG tablet Take 1 tablet (1,000 mg total) by mouth daily with breakfast. 04/05/19 07/04/19  Eber Jones, MD  Multiple Vitamin (MULTIVITAMIN) tablet Take 1 tablet by mouth at bedtime.     [provider]  pantoprazole (PROTONIX) 40 MG tablet Take 40 mg by mouth at bedtime.     [provider]  prazosin (MINIPRESS) 1 MG capsule TAKE 1 CAPSULE (1 MG TOTAL) BY MOUTH AT BEDTIME. ADD TO THE 4 MG YOU'RE ALREADY TAKING TO =5 MG. 03/14/19   Hurst, Helene Kelp T, PA-C   prazosin (MINIPRESS) 2 MG capsule TAKE 2 CAPSULES BY MOUTH AT BEDTIME. 03/14/19   Hurst, Teresa T, PA-C  propranolol (INDERAL) 40 MG tablet TAKE 1 TABLET BY MOUTH 2 TIMES DAILY. 03/14/19   Donnal Moat T, PA-C  QUEtiapine (SEROQUEL) 200 MG tablet Take 1.5 tablets (300 mg total) by mouth at bedtime. 03/14/19   Donnal Moat T, PA-C  trimethoprim-polymyxin b (POLYTRIM) ophthalmic solution Place 2 drops into both eyes every 4 (four) hours. Patient  not taking: Reported on 04/05/2019 02/03/19   Chevis Pretty, FNP    Family History Family History  Problem Relation Age of Onset  . Cancer Mother        squamous cell carcinoma  . Hyperlipidemia Mother   . Depression Mother   . Anxiety disorder Mother   . Obesity Mother   . Heart disease Father 38       cardiomegaly, CHF; steroid use.  [name] Hyperlipidemia Father   . Hypertension Father   . Mental retardation Father   . High blood pressure Father   . Depression Father   . Anxiety disorder Father   . Obesity Father   . Cancer Maternal Grandmother   . Diabetes Maternal Grandmother   . Heart disease Maternal Grandmother   . Hyperlipidemia Maternal Grandmother   . Hypertension Maternal Grandmother   . Stroke Maternal Grandmother   . Heart disease Paternal Grandmother   . Hypertension Paternal Grandmother   . Heart disease Paternal Grandfather   . Hyperlipidemia Paternal Grandfather   . Mental illness Paternal Grandfather   . Rheum arthritis Sister   . Breast cancer Maternal Aunt   . Thyroid cancer Paternal Aunt     Social History Social History   Tobacco Use  . Smoking status: Former Smoker    Years: 10.00    Types: Cigarettes    Quit date: 2003    Years since quitting: 17.5  . Smokeless tobacco: Never Used  Substance Use Topics  . Alcohol use: Yes    Alcohol/week: 0.0 standard drinks    Comment: rare  . Drug use: No     Allergies   Penicillins, Tamiflu [oseltamivir phosphate], Tamiflu [oseltamivir phosphate],  Cephalosporins, and Latex   Review of Systems Review of Systems  Constitutional: Negative for activity change, chills and fever.  Respiratory: Negative for cough, shortness of breath and wheezing.   Cardiovascular: Negative for chest pain, palpitations and leg swelling.  Gastrointestinal: Positive for abdominal pain and nausea. Negative for blood in stool, constipation, diarrhea and vomiting.  Genitourinary: Positive for flank pain. Negative for dysuria, hematuria, pelvic pain, urgency, vaginal bleeding, vaginal discharge and vaginal pain.  Musculoskeletal: Positive for back pain. Negative for arthralgias, myalgias, neck pain and neck stiffness.  Skin: Negative for rash.  Allergic/Immunologic: Negative for immunocompromised state.  Neurological: Negative for dizziness, weakness, numbness and headaches.  Psychiatric/Behavioral: Negative for confusion.     Physical Exam Updated Vital Signs BP 126/80 (BP Location: Right Arm)   Pulse 71   Temp 98.1 F (36.7 C) (Oral)   Resp 17   Ht 5\' 6"  (1.676 m)   Wt 127 kg   SpO2 100%   BMI 45.19 kg/m   Physical Exam Vitals signs and nursing note reviewed.  Constitutional:      General: She is not in acute distress.    Comments: Uncomfortable appearing  HENT:     Head: Normocephalic.  Eyes:     Conjunctiva/sclera: Conjunctivae normal.  Neck:     Musculoskeletal: Neck supple.  Cardiovascular:     Rate and Rhythm: Normal rate and regular rhythm.     Heart sounds: No murmur. No friction rub. No gallop.   Pulmonary:     Effort: Pulmonary effort is normal. No respiratory distress.     Breath sounds: No stridor. No wheezing, rhonchi or rales.  Chest:     Chest wall: No tenderness.  Abdominal:     General: There is no distension.     Palpations: Abdomen is  soft. There is no mass.     Tenderness: There is abdominal tenderness. There is right CVA tenderness. There is no left CVA tenderness, guarding or rebound.     Hernia: No hernia is  present.     Comments: Tender to palpation over the right CVA region.  She has mild discomfort around the right flank, following the path of the right ureter.  No focal tenderness palpation the right upper quadrant.  Negative Murphy sign.  Abdomen is obese, but soft and nondistended.  Normoactive bowel sounds in all 4 quadrants.  No tenderness over McBurney's point.  No tenderness to palpation over the bilateral lower abdomen.  Musculoskeletal:     Comments: No tenderness to the cervical, thoracic, or lumbar spinous processes or bilateral paraspinal muscles.  No overlying rash.  Skin:    General: Skin is warm.     Findings: No rash.  Neurological:     Mental Status: She is alert.  Psychiatric:        Behavior: Behavior normal.      ED Treatments / Results  Labs (all labs ordered are listed, but only abnormal results are displayed) Labs Reviewed  URINALYSIS, ROUTINE W REFLEX MICROSCOPIC - Abnormal; Notable for the following components:      Result Value   APPearance HAZY (*)    All other components within normal limits  CBC WITH DIFFERENTIAL/PLATELET - Abnormal; Notable for the following components:   Hemoglobin 11.7 (*)    All other components within normal limits  COMPREHENSIVE METABOLIC PANEL - Abnormal; Notable for the following components:   Glucose, Bld 109 (*)    Calcium 8.7 (*)    Albumin 3.4 (*)    Total Bilirubin 0.2 (*)    All other components within normal limits  PREGNANCY, URINE  LIPASE, BLOOD    EKG None  Radiology No results found.  Procedures Procedures (including critical care time)  Medications Ordered in ED Medications  lidocaine (LIDODERM) 5 % 1 patch (1 patch Transdermal Patch Applied 04/07/19 0734)  ondansetron (ZOFRAN) injection 4 mg (4 mg Intravenous Given 04/07/19 0610)  morphine 4 MG/ML injection 4 mg (4 mg Intravenous Given 04/07/19 0610)  ketorolac (TORADOL) 30 MG/ML injection 30 mg (30 mg Intravenous Given 04/07/19 0732)  methocarbamol  (ROBAXIN) tablet 500 mg (500 mg Oral Given 04/07/19 0734)     Initial Impression / Assessment and Plan / ED Course  I have reviewed the triage vital signs and the nursing notes.  Pertinent labs & imaging results that were available during my care of the patient were reviewed by me and considered in my medical decision making (see chart for details).  Clinical Course as of Apr 07 743  Fri Apr 07, 2019  0700 Patient recheck.  She reports that pain has been increasing since last reevaluation.  Shared decision making conversation where we reviewed lab results.  Will order an x-ray of the thoracic spine.  Will order Lidoderm patch, Toradol, and Robaxin, and reassess.   [MM]    Clinical Course User Index [MM] Brice Kossman A, PA-C       43 year old female with a history of bipolar 1 disorder, PTSD, uterine fibroids, and anxiety presenting with right flank pain, onset last night.  Pain radiates around to the right upper abdomen and she is nauseated.  No urinary complaints.  No known history of trauma or injury surgical history includes prior cholecystectomy.  She is hemodynamically stable on arrival.  On initial exam, patient appears uncomfortable.  Pain  is located over the right CVA as well as the right posterior inferior ribs.  No overlying rash.  Given history of present illness, will order urinalysis as symptoms could be related to nephrolithiasis.  Urinalysis is unremarkable for infection and no hemoglobinuria to suggest nephrolithiasis.  Basic labs have been ordered since she is endorsing pain rating around to the abdomen, which are grossly unremarkable.  On initial reevaluation, patient initially reported significant improvement in her pain after she was given morphine and Zofran.  However, on second evaluation she is appearing more uncomfortable. After shared decision-making conversation and I will order a x-ray of the thoracic spine although she is not having any midline tenderness.  We  will also order Lidoderm patch, Toradol, and Robaxin and plan for reassessment.  Since labs are reassuring, if imaging is unremarkable, think is reasonable to treat this as musculoskeletal pain.  I have a low suspicion for pneumonia as patient is having no URI symptoms and low suspicion for aortic dissection, ACS, pneumothorax, or PE.  Patient care transferred to PA Law at the end of my shift. Patient presentation, ED course, and plan of care discussed with review of all pertinent labs and imaging. Please see his/her note for further details regarding further ED course and disposition.   Final Clinical Impressions(s) / ED Diagnoses   Final diagnoses:  None    ED Discharge Orders    None       Joanne Gavel, PA-C 04/07/19 0744    Ripley Fraise, MD 04/09/19 1006

## 2019-04-07 NOTE — ED Provider Notes (Signed)
Signout from previous provider, Mia McDonald, PA-C, at shift change  See previous providers note for full H&P   Briefly, patient presents with R flank pain since last night 10P. Labs unremarkable, including urine.  No history of kidney stones.  Hx of cholecystectomy. Labs unremarkable. X-ray pending.  Suspect musculoskeletal cause.  Lidoderm, Toradol, Robaxin. If more comfortable, following xray can discharge home with Robaxin, NSAIDs.  8:19 AM On reassessment, patient's symptoms are improved after the above medications.  X-ray is negative for acute findings.  Will discharge home with Robaxin, Mobic, Lidoderm patches.  Ice and heat discussed.  Follow-up to PCP as needed.  Return precautions discussed.  Patient understands and agrees with plan.  Patient vital stable throughout ED course and discharged in satisfactory condition.    Frederica Kuster, PA-C 04/07/19 0820    Ripley Fraise, MD 04/09/19 1006

## 2019-04-10 NOTE — Progress Notes (Signed)
Office: [phone]  /  Fax: [phone]   HPI:   Chief Complaint: OBESITY Caroline Gonzales is here to discuss her progress with her obesity treatment plan. She is on the portion control better and make smarter food choices, such as increase vegetables and decrease simple carbohydrates and is following her eating plan approximately 90 % of the time. She states she is walking more at work, and walking for 20 minutes 5 times per week. Smt is feeling some relief knowing her son will be home until January home schooling. Her martial stress is better. She states work has been stressful with some hard cases. She has been eating healthier options for lunch at work. She is still having reflux at night, so she is often skipping dinner.  Her weight is 284 lb (128.8 kg) today and has gained 4 lbs since her last visit. She has lost 0 lbs since starting treatment with [name].  Pre-Diabetes [name] has a diagnosis of pre-diabetes based on her elevated Hgb A1c and was informed this puts her at greater risk of developing diabetes. Last Hgb A1c is elevated at 5.8 and insulin of 36.2. She is on metformin and Victoza, and continues to work on diet and exercise to decrease risk of diabetes. She denies nausea or hypoglycemia.  At risk for diabetes Caroline Gonzales is at higher than average risk for developing diabetes due to her obesity and pre-diabetes. She currently denies polyuria or polydipsia.  Hypertriglyceridemia Caroline Gonzales's last LDL was controlled at 70, and triglycerides are significantly elevated >2 years.  ASSESSMENT AND PLAN:  Prediabetes - Plan: metFORMIN (GLUCOPHAGE) 1000 MG tablet  Hypertriglyceridemia  At risk for diabetes mellitus  Class 3 severe obesity with serious comorbidity and body mass index (BMI) of 45.0 to 49.9 in adult, unspecified obesity type [facility])  PLAN:  Pre-Diabetes Caroline Gonzales will continue to work on weight loss, exercise, and decreasing simple carbohydrates in her diet to help decrease  the risk of diabetes. We dicussed metformin including benefits and risks. She was informed that eating too many simple carbohydrates or too many calories at one sitting increases the likelihood of GI side effects. Caroline Gonzales agrees to continue taking metformin 500 mg PO daily #90 with no refills. We will repeat labs in January 2021. Caroline Gonzales agrees to follow up with our clinic as needed as directed to monitor her progress.  Diabetes risk counseling Caroline Gonzales was given extended (15 minutes) diabetes prevention counseling today. She is 43 y.o. female and has risk factors for diabetes including obesity and pre-diabetes. We discussed intensive lifestyle modifications today with an emphasis on weight loss as well as increasing exercise and decreasing simple carbohydrates in her diet. We will repeat FLP in 2021. Caroline Gonzales agrees to follow up with our clinic as needed.   Hypertriglyceridemia We will repeat FLP in January 2021. Caroline Gonzales agrees to follow up with our clinic as needed.  Obesity Caroline Gonzales is currently in the action stage of change. As such, her goal is to continue with weight loss efforts  Caroline Gonzales is to plan to work on breakfast and lunch everyday. She has agreed to portion control better and make smarter food choices, such as increase vegetables and decrease simple carbohydrates  Caroline Gonzales has been instructed to work up to a goal of 150 minutes of combined cardio and strengthening exercise per week for weight loss and overall health benefits. We discussed the following Behavioral Modification Strategies today: increasing lean protein intake, increasing vegetables and work on meal planning and easy cooking plans, no skipping meals, keeping  healthy foods in the home, and planning for success   Caroline Gonzales has agreed to follow up with our clinic as needed. She was informed of the importance of frequent follow up visits to maximize her success with intensive lifestyle modifications for her multiple health  conditions.  ALLERGIES: Allergies  Allergen Reactions  . Penicillins Shortness Of Breath and Rash    Did it involve swelling of the face/tongue/throat, SOB, or low BP? Yes Did it involve sudden or severe rash/hives, skin peeling, or any reaction on the inside of your mouth or nose? Yes Did you need to seek medical attention at a hospital or doctor's office? Yes When did it last happen?childhood allergy If all above answers are "NO", may proceed with cephalosporin use.    . Tamiflu [Oseltamivir Phosphate] Anaphylaxis  . Tamiflu [Oseltamivir Phosphate] Anaphylaxis  . Cephalosporins Rash  . Latex Rash    MEDICATIONS: Current Outpatient Medications on File Prior to Visit  Medication Sig Dispense Refill  . Cholecalciferol (VITAMIN D3) 5000 units CAPS Take 5,000 Units by mouth at bedtime.    . clonazePAM (KLONOPIN) 0.5 MG tablet TAKE 1 TABLET BY MOUTH EVERY 6 HOURS AS NEEDED FOR ANXIETY 120 tablet 1  . fexofenadine (ALLEGRA) 180 MG tablet Take 180 mg by mouth at bedtime.    . gabapentin (NEURONTIN) 300 MG capsule Take 1 capsule (300 mg total) by mouth at bedtime. 90 capsule 0  . ibuprofen (ADVIL,MOTRIN) 200 MG tablet Take 400 mg by mouth daily as needed for headache or moderate pain.    [name] levonorgestrel (MIRENA) 20 MCG/24HR IUD 1 each by Intrauterine route once.     . meclizine (ANTIVERT) 25 MG tablet Take 1 tablet (25 mg total) by mouth 3 (three) times daily as needed for dizziness. 60 tablet 1  . Multiple Vitamin (MULTIVITAMIN) tablet Take 1 tablet by mouth at bedtime.     . pantoprazole (PROTONIX) 40 MG tablet Take 40 mg by mouth at bedtime.     . prazosin (MINIPRESS) 1 MG capsule TAKE 1 CAPSULE (1 MG TOTAL) BY MOUTH AT BEDTIME. ADD TO THE 4 MG YOU'RE ALREADY TAKING TO =5 MG. 90 capsule 0  . prazosin (MINIPRESS) 2 MG capsule TAKE 2 CAPSULES BY MOUTH AT BEDTIME. 180 capsule 0  . propranolol (INDERAL) 40 MG tablet TAKE 1 TABLET BY MOUTH 2 TIMES DAILY. 180 tablet 0  . QUEtiapine  (SEROQUEL) 200 MG tablet Take 1.5 tablets (300 mg total) by mouth at bedtime. 135 tablet 0  . albuterol (PROVENTIL HFA;VENTOLIN HFA) 108 (90 Base) MCG/ACT inhaler Inhale 2 puffs into the lungs every 4 (four) hours as needed for wheezing or shortness of breath. (Patient not taking: Reported on 04/05/2019) 1 Inhaler 1  . azithromycin (ZITHROMAX) 250 MG tablet Take 2 pills today. Take 1 pill days 2-5 (Patient not taking: Reported on 04/05/2019) 6 tablet 0  . L-Methylfolate-Algae (DEPLIN 15) 15-90.314 MG CAPS Take 1 capsule by mouth at bedtime.     [name] trimethoprim-polymyxin b (POLYTRIM) ophthalmic solution Place 2 drops into both eyes every 4 (four) hours. (Patient not taking: Reported on 04/05/2019) 10 mL 0   No current facility-administered medications on file prior to visit.     PAST MEDICAL HISTORY: Past Medical History:  Diagnosis Date  . Allergy    Zyrtec, Allegra.  . Anxiety    followed by Dr. Toy Cookey  . Back pain   . Bipolar 1 disorder ([place])   . Complication of anesthesia   . Depression   . Fibroid   .  Gallbladder problem   . Gastroparesis 09/14/2012   gastric emptying study in 2014  . GERD (gastroesophageal reflux disease)   . Headache   . Pneumonia    2013ish  . PONV (postoperative nausea and vomiting)   . Pre-diabetes   . PTSD (post-traumatic stress disorder)   . Sleep apnea    do not use a cpap  . Tinnitus   . Vitamin D deficiency   . Wears glasses     PAST SURGICAL HISTORY: Past Surgical History:  Procedure Laterality Date  . ANKLE ARTHROSCOPY Left 2011  . CESAREAN SECTION    . CHOLECYSTECTOMY    . DILATION AND CURETTAGE OF UTERUS    . FOREIGN BODY REMOVAL Left 11/18/2016   Procedure: REMOVAL FOREIGN BODY EXTREMITY LEFT FOOT;  Surgeon: Trula Slade, DPM;  Location: [facility];  Service: Podiatry;  Laterality: Left;  . PILONIDAL CYST EXCISION  1990's  . RADIOLOGY WITH ANESTHESIA N/A 11/10/2018   Procedure: MRI WITH ANESTHESIA OF BRAIN AND ORBITS WITH AND WITHOUT  CONTRAST;  Surgeon: Radiologist, Medication, MD;  Location: [facility];  Service: Radiology;  Laterality: N/A;  . TENDON REPAIR Left 2011   Left Ankle  . WISDOM TOOTH EXTRACTION  19090's    SOCIAL HISTORY: Social History   Tobacco Use  . Smoking status: Former Smoker    Years: 10.00    Types: Cigarettes    Quit date: 2003    Years since quitting: 17.5  . Smokeless tobacco: Never Used  Substance Use Topics  . Alcohol use: Yes    Alcohol/week: 0.0 standard drinks    Comment: rare  . Drug use: No    FAMILY HISTORY: Family History  Problem Relation Age of Onset  . Cancer Mother        squamous cell carcinoma  . Hyperlipidemia Mother   . Depression Mother   . Anxiety disorder Mother   . Obesity Mother   . Heart disease Father 63       cardiomegaly, CHF; steroid use.  [name] Hyperlipidemia Father   . Hypertension Father   . Mental retardation Father   . High blood pressure Father   . Depression Father   . Anxiety disorder Father   . Obesity Father   . Cancer Maternal Grandmother   . Diabetes Maternal Grandmother   . Heart disease Maternal Grandmother   . Hyperlipidemia Maternal Grandmother   . Hypertension Maternal Grandmother   . Stroke Maternal Grandmother   . Heart disease Paternal Grandmother   . Hypertension Paternal Grandmother   . Heart disease Paternal Grandfather   . Hyperlipidemia Paternal Grandfather   . Mental illness Paternal Grandfather   . Rheum arthritis Sister   . Breast cancer Maternal Aunt   . Thyroid cancer Paternal Aunt     ROS: Review of Systems  Constitutional: Negative for weight loss.  Gastrointestinal: Negative for nausea.  Genitourinary: Negative for frequency.  Endo/Heme/Allergies: Negative for polydipsia.       Negative hypoglycemia    PHYSICAL EXAM: Blood pressure 135/79, pulse 90, temperature 98.1 F (36.7 C), temperature source Oral, height 5\' 5"  (1.651 m), weight 284 lb (128.8 kg), SpO2 97 %. Body mass index is 47.26 kg/m.  Physical Exam Vitals signs reviewed.  Constitutional:      Appearance: Normal appearance. She is obese.  Cardiovascular:     Rate and Rhythm: Normal rate.     Pulses: Normal pulses.  Pulmonary:     Effort: Pulmonary effort is normal.  Breath sounds: Normal breath sounds.  Musculoskeletal: Normal range of motion.  Skin:    General: Skin is warm and dry.  Neurological:     Mental Status: She is alert and oriented to person, place, and time.  Psychiatric:        Mood and Affect: Mood normal.        Behavior: Behavior normal.     RECENT LABS AND TESTS: BMET    Component Value Date/Time   NA 135 04/07/2019 0606   NA 138 10/10/2018 0829   K 4.0 04/07/2019 0606   CL 102 04/07/2019 0606   CO2 23 04/07/2019 0606   GLUCOSE 109 (H) 04/07/2019 0606   BUN 14 04/07/2019 0606   BUN 12 10/10/2018 0829   CREATININE 1.00 04/07/2019 0606   CREATININE 0.95 06/27/2016 0933   CALCIUM 8.7 (L) 04/07/2019 0606   GFRNONAA >60 04/07/2019 0606   GFRAA >60 04/07/2019 0606   Lab Results  Component Value Date   HGBA1C 5.8 (H) 03/22/2019   HGBA1C 5.2 10/10/2018   HGBA1C 5.3 05/25/2018   HGBA1C 5.4 02/10/2018   HGBA1C 5.5 10/07/2017   Lab Results  Component Value Date   INSULIN 36.2 (H) 03/22/2019   INSULIN 21.4 10/10/2018   INSULIN 87.4 (H) 05/25/2018   INSULIN 20.3 02/10/2018   INSULIN 15.6 10/07/2017   CBC    Component Value Date/Time   WBC 8.3 04/07/2019 0606   RBC 4.07 04/07/2019 0606   HGB 11.7 (L) 04/07/2019 0606   HGB 11.2 06/22/2017 0904   HCT 37.1 04/07/2019 0606   HCT 34.8 06/22/2017 0904   PLT 253 04/07/2019 0606   PLT 332 11/12/2016 1651   MCV 91.2 04/07/2019 0606   MCV 86 06/22/2017 0904   MCH 28.7 04/07/2019 0606   MCHC 31.5 04/07/2019 0606   RDW 14.7 04/07/2019 0606   RDW 15.7 (H) 06/22/2017 0904   LYMPHSABS 2.0 04/07/2019 0606   LYMPHSABS 1.5 06/22/2017 0904   MONOABS 0.8 04/07/2019 0606   EOSABS 0.2 04/07/2019 0606   EOSABS 0.2 06/22/2017 0904    BASOSABS 0.0 04/07/2019 0606   BASOSABS 0.0 06/22/2017 0904   Iron/TIBC/Ferritin/ %Sat    Component Value Date/Time   IRON 53 08/31/2016 1644   TIBC 280 08/31/2016 1644   FERRITIN 36 08/31/2016 1644   IRONPCTSAT 19 08/31/2016 1644   Lipid Panel     Component Value Date/Time   CHOL 157 03/22/2019 1607   TRIG 231 (H) 03/22/2019 1607   HDL 41 03/22/2019 1607   CHOLHDL 3.5 02/10/2018 0749   CHOLHDL 4.1 12/01/2015 0914   VLDL 37 (H) 12/01/2015 0914   LDLCALC 70 03/22/2019 1607   Hepatic Function Panel     Component Value Date/Time   PROT 7.3 04/07/2019 0606   PROT 6.9 10/10/2018 0829   ALBUMIN 3.4 (L) 04/07/2019 0606   ALBUMIN 3.9 10/10/2018 0829   AST 19 04/07/2019 0606   ALT 19 04/07/2019 0606   ALKPHOS 77 04/07/2019 0606   BILITOT 0.2 (L) 04/07/2019 0606   BILITOT <0.2 10/10/2018 0829      Component Value Date/Time   TSH 1.620 08/31/2016 1644   TSH 1.83 12/01/2015 0914      OBESITY BEHAVIORAL INTERVENTION VISIT  Today's visit was # 26   Starting weight: 278 lbs Starting date: 08/31/2016 Today's weight : 284 lbs Today's date: 04/09/2019 Total lbs lost to date: 0    ASK: We discussed the diagnosis of obesity with Marjie Skiff Audia today and Reylene agreed to  give [name] permission to discuss obesity behavioral modification therapy today.  ASSESS: Sanskriti has the diagnosis of obesity and her BMI today is 45.21 Georgette is in the action stage of change   ADVISE: Monalisa was educated on the multiple health risks of obesity as well as the benefit of weight loss to improve her health. She was advised of the need for long term treatment and the importance of lifestyle modifications to improve her current health and to decrease her risk of future health problems.  AGREE: Multiple dietary modification options and treatment options were discussed and  Abagayle agreed to follow the recommendations documented in the above note.  ARRANGE: Albertine was educated on the  importance of frequent visits to treat obesity as outlined per CMS and USPSTF guidelines and agreed to schedule her next follow up appointment today.  I, Trixie Dredge, am acting as transcriptionist for Ilene Qua, MD  I have reviewed the above documentation for accuracy and completeness, and I agree with the above. - Ilene Qua, MD

## 2019-04-12 DIAGNOSIS — F4310 Post-traumatic stress disorder, unspecified: Secondary | ICD-10-CM | POA: Diagnosis not present

## 2019-04-13 ENCOUNTER — Other Ambulatory Visit: Payer: Self-pay | Admitting: Physician Assistant

## 2019-04-13 ENCOUNTER — Ambulatory Visit: Payer: Self-pay | Admitting: Adult Health

## 2019-04-14 MED FILL — clonazePAM 0.5 MG TABS: 0.5 | 30 days supply | Qty: 120 | Fill #0

## 2019-04-14 NOTE — Telephone Encounter (Signed)
Last fill 06/30

## 2019-04-24 ENCOUNTER — Ambulatory Visit: Payer: 59 | Admitting: Obstetrics and Gynecology

## 2019-04-24 ENCOUNTER — Encounter: Payer: Self-pay | Admitting: Obstetrics and Gynecology

## 2019-04-24 ENCOUNTER — Other Ambulatory Visit: Payer: Self-pay

## 2019-04-24 VITALS — BP 132/86 | HR 80 | Temp 98.7°F | Ht 64.57 in | Wt 288.0 lb

## 2019-04-24 DIAGNOSIS — Z01419 Encounter for gynecological examination (general) (routine) without abnormal findings: Secondary | ICD-10-CM | POA: Diagnosis not present

## 2019-04-24 DIAGNOSIS — Z30431 Encounter for routine checking of intrauterine contraceptive device: Secondary | ICD-10-CM | POA: Diagnosis not present

## 2019-04-24 NOTE — Progress Notes (Signed)
43 y.o. G46P1000 Married White or Caucasian Not Hispanic or Latino female here for annual exam.  She has a mirena IUD, inserted 03/24/18. Doing well, no bleeding. No dyspareunia.     No LMP recorded. (Menstrual status: IUD).          Sexually active: Yes.    The current method of family planning is IUD.    Exercising: Yes.    walking Smoker:  no  Health Maintenance: Pap:12-12-15 WNL NEG HR HPV History of abnormal Pap:no MMG:07/12/2018 Birads 1 negative BLT:JQZES TDaP:2017 Gardasil:Never   reports that she quit smoking about 17 years ago. Her smoking use included cigarettes. She quit after 10.00 years of use. She has never used smokeless tobacco. She reports current alcohol use. She reports that she does not use drugs. Rare ETOH. 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 She is working full time. 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  Past Medical History:  Diagnosis Date  . Allergy    Zyrtec, Allegra.  . Anxiety    followed by Dr. Toy Cookey  . Back pain   . Bipolar 1 disorder ([place])   . Complication of anesthesia   . Depression   . Fibroid   . Gallbladder problem   . Gastroparesis 09/14/2012   gastric emptying study in 2014  . GERD (gastroesophageal reflux disease)   . Headache   . Pneumonia    2013ish  . PONV (postoperative nausea and vomiting)   . Pre-diabetes   . PTSD (post-traumatic stress disorder)   . Sleep apnea    do not use a cpap  . Tinnitus   . Vitamin D deficiency   . Wears glasses     Past Surgical History:  Procedure Laterality Date  . ANKLE ARTHROSCOPY Left 2011  . CESAREAN SECTION    . CHOLECYSTECTOMY    . DILATION AND CURETTAGE OF UTERUS    . FOREIGN BODY REMOVAL Left 11/18/2016   Procedure: REMOVAL FOREIGN BODY EXTREMITY LEFT FOOT;  Surgeon: Trula Slade, DPM;  Location: [facility];  Service: Podiatry;  Laterality: Left;  . PILONIDAL CYST EXCISION  1990's  . RADIOLOGY WITH ANESTHESIA N/A 11/10/2018   Procedure: MRI WITH  ANESTHESIA OF BRAIN AND ORBITS WITH AND WITHOUT CONTRAST;  Surgeon: Radiologist, Medication, MD;  Location: [facility];  Service: Radiology;  Laterality: N/A;  . TENDON REPAIR Left 2011   Left Ankle  . WISDOM TOOTH EXTRACTION  19090's    Current Outpatient Medications  Medication Sig Dispense Refill  . Cholecalciferol (VITAMIN D3) 5000 units CAPS Take 5,000 Units by mouth at bedtime.    . clonazePAM (KLONOPIN) 0.5 MG tablet TAKE 1 TABLET BY MOUTH EVERY 6 HOURS AS NEEDED FOR ANXIETY 120 tablet 1  . fexofenadine (ALLEGRA) 180 MG tablet Take 180 mg by mouth at bedtime.    . gabapentin (NEURONTIN) 300 MG capsule Take 1 capsule (300 mg total) by mouth at bedtime. 90 capsule 0  . ibuprofen (ADVIL,MOTRIN) 200 MG tablet Take 400 mg by mouth daily as needed for headache or moderate pain.    [name] L-Methylfolate-Algae (DEPLIN 15) 15-90.314 MG CAPS Take 1 capsule by mouth at bedtime.     . lamoTRIgine (LAMICTAL) 150 MG tablet TAKE 1 TABLET BY MOUTH TWICE DAILY 180 tablet 0  . levonorgestrel (MIRENA) 20 MCG/24HR IUD 1 each by Intrauterine route once.     . meclizine (ANTIVERT) 25 MG tablet Take 1 tablet (25 mg total) by mouth 3 (three) times daily as needed  for dizziness. 60 tablet 1  . metFORMIN (GLUCOPHAGE) 1000 MG tablet Take 1 tablet (1,000 mg total) by mouth daily with breakfast. 90 tablet 0  . Multiple Vitamin (MULTIVITAMIN) tablet Take 1 tablet by mouth at bedtime.     . pantoprazole (PROTONIX) 40 MG tablet Take 40 mg by mouth at bedtime.     . prazosin (MINIPRESS) 1 MG capsule TAKE 1 CAPSULE (1 MG TOTAL) BY MOUTH AT BEDTIME. ADD TO THE 4 MG YOU'RE ALREADY TAKING TO =5 MG. 90 capsule 0  . prazosin (MINIPRESS) 2 MG capsule TAKE 2 CAPSULES BY MOUTH AT BEDTIME. 180 capsule 0  . propranolol (INDERAL) 40 MG tablet TAKE 1 TABLET BY MOUTH 2 TIMES DAILY. 180 tablet 0  . QUEtiapine (SEROQUEL) 200 MG tablet Take 1.5 tablets (300 mg total) by mouth at bedtime. 135 tablet 0   No current facility-administered  medications for this visit.     Family History  Problem Relation Age of Onset  . Cancer Mother        squamous cell carcinoma  . Hyperlipidemia Mother   . Depression Mother   . Anxiety disorder Mother   . Obesity Mother   . Heart disease Father 52       cardiomegaly, CHF; steroid use.  [name] Hyperlipidemia Father   . Hypertension Father   . Mental retardation Father   . High blood pressure Father   . Depression Father   . Anxiety disorder Father   . Obesity Father   . Cancer Maternal Grandmother   . Diabetes Maternal Grandmother   . Heart disease Maternal Grandmother   . Hyperlipidemia Maternal Grandmother   . Hypertension Maternal Grandmother   . Stroke Maternal Grandmother   . Heart disease Paternal Grandmother   . Hypertension Paternal Grandmother   . Heart disease Paternal Grandfather   . Hyperlipidemia Paternal Grandfather   . Mental illness Paternal Grandfather   . Rheum arthritis Sister   . Multiple sclerosis Sister   . Breast cancer Maternal Aunt   . Thyroid cancer Paternal Aunt     Review of Systems  Constitutional: Negative.   HENT: Negative.   Eyes: Negative.   Respiratory: Negative.   Cardiovascular: Negative.   Endocrine: Negative.   Genitourinary: Negative.   Musculoskeletal: Negative.   Skin: Negative.   Allergic/Immunologic: Negative.   Neurological: Negative.   Hematological: Negative.   Psychiatric/Behavioral: Negative.     Exam:   BP 132/86 (BP Location: Right Arm, Patient Position: Sitting, Cuff Size: Large)   Pulse 80   Temp 98.7 F (37.1 C)   Ht 5' 4.57" (1.64 m)   Wt 288 lb (130.6 kg)   BMI 48.57 kg/m   Weight change: @WEIGHTCHANGE @ Height:   Height: 5' 4.57" (164 cm)  Ht Readings from Last 3 Encounters:  04/24/19 5' 4.57" (1.64 m)  04/07/19 5\' 6"  (1.676 m)  04/05/19 5\' 5"  (1.651 m)    General appearance: alert, cooperative and appears stated age Head: Normocephalic, without obvious abnormality, atraumatic Neck: no adenopathy,  supple, symmetrical, trachea midline and thyroid normal to inspection and palpation Lungs: clear to auscultation bilaterally Cardiovascular: regular rate and rhythm Breasts: normal appearance, no masses or tenderness Abdomen: soft, non-tender; non distended,  no masses,  no organomegaly Extremities: extremities normal, atraumatic, no cyanosis or edema Skin: Skin color, texture, turgor normal. No rashes or lesions Lymph nodes: Cervical, supraclavicular, and axillary nodes normal. No abnormal inguinal nodes palpated Neurologic: Grossly normal   Pelvic: External genitalia:  no lesions  Urethra:  normal appearing urethra with no masses, tenderness or lesions              Bartholins and Skenes: normal                 Vagina: normal appearing vagina with normal color and discharge, no lesions              Cervix: no lesions and IUD string 3 cm               Bimanual Exam:  Uterus:  exam limited by BMI, no masses              Adnexa: no mass, fullness, tenderness               Rectovaginal: Confirms               Anus:  normal sphincter tone, no lesions  Chaperone was present for exam.  A:  Well Woman with normal exam  IUD check, doing well  P:   No Pap this year  Discussed breast self exam  Discussed calcium and vit D intake  Labs with primary  Mammogram in the fall

## 2019-04-24 NOTE — Patient Instructions (Signed)
EXERCISE AND DIET:  We recommended that you start or continue a regular exercise program for good health. Regular exercise means any activity that makes your heart beat faster and makes you sweat.  We recommend exercising at least 30 minutes per day at least 3 days a week, preferably 4 or 5.  We also recommend a diet low in fat and sugar.  Inactivity, poor dietary choices and obesity can cause diabetes, heart attack, stroke, and kidney damage, among others.    ALCOHOL AND SMOKING:  Women should limit their alcohol intake to no more than 7 drinks/beers/glasses of wine (combined, not each!) per week. Moderation of alcohol intake to this level decreases your risk of breast cancer and liver damage. And of course, no recreational drugs are part of a healthy lifestyle.  And absolutely no smoking or even second hand smoke. Most people know smoking can cause heart and lung diseases, but did you know it also contributes to weakening of your bones? Aging of your skin?  Yellowing of your teeth and nails?  CALCIUM AND VITAMIN D:  Adequate intake of calcium and Vitamin D are recommended.  The recommendations for exact amounts of these supplements seem to change often, but generally speaking 1,200 mg of calcium (between diet and supplement) and 800 units of Vitamin D per day seems prudent. Certain women may benefit from higher intake of Vitamin D.  If you are among these women, your doctor will have told you during your visit.    PAP SMEARS:  Pap smears, to check for cervical cancer or precancers,  have traditionally been done yearly, although recent scientific advances have shown that most women can have pap smears less often.  However, every woman still should have a physical exam from her gynecologist every year. It will include a breast check, inspection of the vulva and vagina to check for abnormal growths or skin changes, a visual exam of the cervix, and then an exam to evaluate the size and shape of the uterus and  ovaries.  And after 43 years of age, a rectal exam is indicated to check for rectal cancers. We will also provide age appropriate advice regarding health maintenance, like when you should have certain vaccines, screening for sexually transmitted diseases, bone density testing, colonoscopy, mammograms, etc.   MAMMOGRAMS:  All women over 40 years old should have a yearly mammogram. Many facilities now offer a "3D" mammogram, which may cost around $50 extra out of pocket. If possible,  we recommend you accept the option to have the 3D mammogram performed.  It both reduces the number of women who will be called back for extra views which then turn out to be normal, and it is better than the routine mammogram at detecting truly abnormal areas.    COLON CANCER SCREENING: Now recommend starting at age 45. At this time colonoscopy is not covered for routine screening until 50. There are take home tests that can be done between 45-49.   COLONOSCOPY:  Colonoscopy to screen for colon cancer is recommended for all women at age 50.  We know, you hate the idea of the prep.  We agree, BUT, having colon cancer and not knowing it is worse!!  Colon cancer so often starts as a polyp that can be seen and removed at colonscopy, which can quite literally save your life!  And if your first colonoscopy is normal and you have no family history of colon cancer, most women don't have to have it again for   10 years.  Once every ten years, you can do something that may end up saving your life, right?  We will be happy to help you get it scheduled when you are ready.  Be sure to check your insurance coverage so you understand how much it will cost.  It may be covered as a preventative service at no cost, but you should check your particular policy.      Breast Self-Awareness Breast self-awareness means being familiar with how your breasts look and feel. It involves checking your breasts regularly and reporting any changes to your  health care provider. Practicing breast self-awareness is important. A change in your breasts can be a sign of a serious medical problem. Being familiar with how your breasts look and feel allows you to find any problems early, when treatment is more likely to be successful. All women should practice breast self-awareness, including women who have had breast implants. How to do a breast self-exam One way to learn what is normal for your breasts and whether your breasts are changing is to do a breast self-exam. To do a breast self-exam: Look for Changes  1. Remove all the clothing above your waist. 2. Stand in front of a mirror in a room with good lighting. 3. Put your hands on your hips. 4. Push your hands firmly downward. 5. Compare your breasts in the mirror. Look for differences between them (asymmetry), such as: ? Differences in shape. ? Differences in size. ? Puckers, dips, and bumps in one breast and not the other. 6. Look at each breast for changes in your skin, such as: ? Redness. ? Scaly areas. 7. Look for changes in your nipples, such as: ? Discharge. ? Bleeding. ? Dimpling. ? Redness. ? A change in position. Feel for Changes Carefully feel your breasts for lumps and changes. It is best to do this while lying on your back on the floor and again while sitting or standing in the shower or tub with soapy water on your skin. Feel each breast in the following way:  Place the arm on the side of the breast you are examining above your head.  Feel your breast with the other hand.  Start in the nipple area and make  inch (2 cm) overlapping circles to feel your breast. Use the pads of your three middle fingers to do this. Apply light pressure, then medium pressure, then firm pressure. The light pressure will allow you to feel the tissue closest to the skin. The medium pressure will allow you to feel the tissue that is a little deeper. The firm pressure will allow you to feel the tissue  close to the ribs.  Continue the overlapping circles, moving downward over the breast until you feel your ribs below your breast.  Move one finger-width toward the center of the body. Continue to use the  inch (2 cm) overlapping circles to feel your breast as you move slowly up toward your collarbone.  Continue the up and down exam using all three pressures until you reach your armpit.  Write Down What You Find  Write down what is normal for each breast and any changes that you find. Keep a written record with breast changes or normal findings for each breast. By writing this information down, you do not need to depend only on memory for size, tenderness, or location. Write down where you are in your menstrual cycle, if you are still menstruating. If you are having trouble noticing differences   in your breasts, do not get discouraged. With time you will become more familiar with the variations in your breasts and more comfortable with the exam. How often should I examine my breasts? Examine your breasts every month. If you are breastfeeding, the best time to examine your breasts is after a feeding or after using a breast pump. If you menstruate, the best time to examine your breasts is 5-7 days after your period is over. During your period, your breasts are lumpier, and it may be more difficult to notice changes. When should I see my health care provider? See your health care provider if you notice:  A change in shape or size of your breasts or nipples.  A change in the skin of your breast or nipples, such as a reddened or scaly area.  Unusual discharge from your nipples.  A lump or thick area that was not there before.  Pain in your breasts.  Anything that concerns you.  

## 2019-04-26 DIAGNOSIS — M5416 Radiculopathy, lumbar region: Secondary | ICD-10-CM | POA: Diagnosis not present

## 2019-04-26 DIAGNOSIS — M5136 Other intervertebral disc degeneration, lumbar region: Secondary | ICD-10-CM | POA: Diagnosis not present

## 2019-04-26 DIAGNOSIS — Z6841 Body Mass Index (BMI) 40.0 and over, adult: Secondary | ICD-10-CM | POA: Diagnosis not present

## 2019-04-26 DIAGNOSIS — R030 Elevated blood-pressure reading, without diagnosis of hypertension: Secondary | ICD-10-CM | POA: Diagnosis not present

## 2019-04-26 DIAGNOSIS — F4310 Post-traumatic stress disorder, unspecified: Secondary | ICD-10-CM | POA: Diagnosis not present

## 2019-04-27 ENCOUNTER — Ambulatory Visit: Payer: 59 | Admitting: Physician Assistant

## 2019-04-27 ENCOUNTER — Other Ambulatory Visit: Payer: Self-pay | Admitting: Neurosurgery

## 2019-04-27 ENCOUNTER — Other Ambulatory Visit (HOSPITAL_COMMUNITY): Payer: Self-pay | Admitting: Neurosurgery

## 2019-04-27 DIAGNOSIS — M5136 Other intervertebral disc degeneration, lumbar region: Secondary | ICD-10-CM

## 2019-05-01 ENCOUNTER — Ambulatory Visit (HOSPITAL_COMMUNITY)
Admission: RE | Admit: 2019-05-01 | Discharge: 2019-05-01 | Disposition: A | Discharge status: Home / Self Care | Payer: 59 | Source: Ambulatory Visit | Attending: Neurosurgery | Admitting: Neurosurgery

## 2019-05-01 ENCOUNTER — Other Ambulatory Visit: Payer: Self-pay

## 2019-05-01 DIAGNOSIS — M5136 Other intervertebral disc degeneration, lumbar region: Secondary | ICD-10-CM | POA: Diagnosis not present

## 2019-05-01 DIAGNOSIS — M545 Low back pain: Secondary | ICD-10-CM | POA: Diagnosis not present

## 2019-05-03 DIAGNOSIS — F4310 Post-traumatic stress disorder, unspecified: Secondary | ICD-10-CM | POA: Diagnosis not present

## 2019-05-09 DIAGNOSIS — H5509 Other forms of nystagmus: Secondary | ICD-10-CM | POA: Diagnosis not present

## 2019-05-09 DIAGNOSIS — H2189 Other specified disorders of iris and ciliary body: Secondary | ICD-10-CM | POA: Diagnosis not present

## 2019-05-09 DIAGNOSIS — H43393 Other vitreous opacities, bilateral: Secondary | ICD-10-CM | POA: Diagnosis not present

## 2019-05-09 DIAGNOSIS — H40013 Open angle with borderline findings, low risk, bilateral: Secondary | ICD-10-CM | POA: Diagnosis not present

## 2019-05-10 DIAGNOSIS — F4310 Post-traumatic stress disorder, unspecified: Secondary | ICD-10-CM | POA: Diagnosis not present

## 2019-05-15 MED FILL — clonazePAM 0.5 MG TABS: 0.5 | 30 days supply | Qty: 120 | Fill #1

## 2019-05-15 MED FILL — lamoTRIgine 150 MG TABS: 150 | 30 days supply | Qty: 60 | Fill #1

## 2019-05-17 DIAGNOSIS — F4310 Post-traumatic stress disorder, unspecified: Secondary | ICD-10-CM | POA: Diagnosis not present

## 2019-05-24 DIAGNOSIS — F4310 Post-traumatic stress disorder, unspecified: Secondary | ICD-10-CM | POA: Diagnosis not present

## 2019-05-25 ENCOUNTER — Encounter (INDEPENDENT_AMBULATORY_CARE_PROVIDER_SITE_OTHER): Payer: Self-pay | Admitting: Family Medicine

## 2019-05-25 NOTE — Telephone Encounter (Signed)
Please advise 

## 2019-05-26 DIAGNOSIS — Z6841 Body Mass Index (BMI) 40.0 and over, adult: Secondary | ICD-10-CM | POA: Diagnosis not present

## 2019-05-26 DIAGNOSIS — R030 Elevated blood-pressure reading, without diagnosis of hypertension: Secondary | ICD-10-CM | POA: Diagnosis not present

## 2019-05-26 DIAGNOSIS — M5136 Other intervertebral disc degeneration, lumbar region: Secondary | ICD-10-CM | POA: Diagnosis not present

## 2019-05-26 DIAGNOSIS — M5416 Radiculopathy, lumbar region: Secondary | ICD-10-CM | POA: Diagnosis not present

## 2019-05-29 MED FILL — GABAPENTIN 300 MG CAPSULE: 300 | 30 days supply | Qty: 90 | Fill #0

## 2019-05-30 ENCOUNTER — Ambulatory Visit: Payer: 59 | Admitting: Physician Assistant

## 2019-05-31 ENCOUNTER — Encounter: Payer: Self-pay | Admitting: Physical Therapy

## 2019-05-31 ENCOUNTER — Other Ambulatory Visit: Payer: Self-pay

## 2019-05-31 ENCOUNTER — Ambulatory Visit: Payer: 59 | Attending: Neurosurgery | Admitting: Physical Therapy

## 2019-05-31 DIAGNOSIS — F4310 Post-traumatic stress disorder, unspecified: Secondary | ICD-10-CM | POA: Diagnosis not present

## 2019-05-31 DIAGNOSIS — G8929 Other chronic pain: Secondary | ICD-10-CM | POA: Diagnosis not present

## 2019-05-31 DIAGNOSIS — M5441 Lumbago with sciatica, right side: Secondary | ICD-10-CM | POA: Diagnosis not present

## 2019-05-31 DIAGNOSIS — R262 Difficulty in walking, not elsewhere classified: Secondary | ICD-10-CM | POA: Diagnosis not present

## 2019-06-01 NOTE — Therapy (Signed)
[place], [place], [address] Phone: [phone]   Fax:  [phone]  Physical Therapy Evaluation  Patient Details  Name: Caroline Gonzales MRN: [id number] Date of Birth: 1976-01-10 Referring Provider (PT): Leonie Green, NP   Encounter Date: 05/31/2019  PT End of Session - 05/31/19 1642    Visit Number  1    Number of Visits  13    Date for PT Re-Evaluation  07/14/19    Authorization Type  [facility] [employer]    PT Start Time  1640    PT Stop Time  1718    PT Time Calculation (min)  38 min    Activity Tolerance  Patient tolerated treatment well    Behavior During Therapy  [facility] for tasks assessed/performed       Past Medical History:  Diagnosis Date  . Allergy    Zyrtec, Allegra.  . Anxiety    followed by Dr. Toy Cookey  . Back pain   . Bipolar 1 disorder ([name])   . Complication of anesthesia   . Depression   . Fibroid   . Gallbladder problem   . Gastroparesis 09/14/2012   gastric emptying study in 2014  . GERD (gastroesophageal reflux disease)   . Headache   . Pneumonia    2013ish  . PONV (postoperative nausea and vomiting)   . Pre-diabetes   . PTSD (post-traumatic stress disorder)   . Sleep apnea    do not use a cpap  . Tinnitus   . Vitamin D deficiency   . Wears glasses     Past Surgical History:  Procedure Laterality Date  . ANKLE ARTHROSCOPY Left 2011  . CESAREAN SECTION    . CHOLECYSTECTOMY    . DILATION AND CURETTAGE OF UTERUS    . FOREIGN BODY REMOVAL Left 11/18/2016   Procedure: REMOVAL FOREIGN BODY EXTREMITY LEFT FOOT;  Surgeon: Trula Slade, DPM;  Location: [place];  Service: Podiatry;  Laterality: Left;  . PILONIDAL CYST EXCISION  1990's  . RADIOLOGY WITH ANESTHESIA N/A 11/10/2018   Procedure: MRI WITH ANESTHESIA OF BRAIN AND ORBITS WITH AND WITHOUT CONTRAST;  Surgeon: Radiologist, Medication, MD;  Location: [place];  Service: Radiology;  Laterality: N/A;  . TENDON REPAIR Left 2011   Left  Ankle  . WISDOM TOOTH EXTRACTION  19090's    There were no vitals filed for this visit.   Subjective Assessment - 05/31/19 1643    Subjective  Started having problems around 2013, 2014 saw a back specialist. Did a CT and a couple of injections through 2015. Was fine until about 2017-2018, 3rd injection about June this year. About 3-4 weeks later, had worse pain than ever before. Did another CT. The nerve pain wraps from Rt hip, across knee and to medial maleolus.    How long can you stand comfortably?  10 min, have to stop and bend over after about 30 min    Patient Stated Goals  fold laundry, walk to car from hospital, stand to do dishes, chores    Currently in Pain?  Yes    Pain Score  1    up to 8/10   Pain Location  Back    Pain Orientation  Lower;Right;Left    Pain Descriptors / Indicators  Sore;Burning;Shooting    Pain Radiating Towards  Rt LE    Aggravating Factors   standing/walking, steps-give out    Pain Relieving Factors  bend fwd  Objective measurements completed on examination: See above findings.              PT Education - 06/01/19 1338    Education Details  anatomy of condition, POC, HEP, exercise form/rationale    Person(s) Educated  Patient    Methods  Explanation;Demonstration;Tactile cues;Verbal cues;Handout    Comprehension  Verbalized understanding;Returned demonstration;Verbal cues required;Tactile cues required;Need further instruction          PT Long Term Goals - 06/01/19 1400      PT LONG TERM GOAL #1   Title  pt will be able to fold laundry without taking a break    Baseline  severe pain while standing at eval    Time  6    Period  Weeks    Status  New    Target Date  07/14/19      PT LONG TERM GOAL #2   Title  will tolerate ambulation from hospital to car without limitation by back pain    Baseline  severe pain at eval    Time  6    Period  Weeks    Status  New    Target Date  07/14/19      PT  LONG TERM GOAL #3   Title  centralization of peripheralized pain    Baseline  down to medial maleolus at eval    Time  6    Period  Weeks    Status  New    Target Date  07/14/19      PT LONG TERM GOAL #4   Title  proper hip hinge to support spine while washing dishes    Baseline  guarded with hip hinge and pain at eval    Time  6    Period  Weeks    Status  New    Target Date  07/14/19             Plan - 06/01/19 1340    Clinical Impression Statement  Pt presents to PT with complaints of LBP that has been on/off chronically and usually resolved with injections; however, she has now experienced further peripheralization with increased LBP bilaterally rather than just to Rt. Aggrivated by repeated extension but not resoleved by repeated flexion. concordant burning found in palpation to Rt piriformis and reduced some with manual therapy. Likely spasm of piriformis as result of long term LBP, will benefit from skilled PT to decrease pain and stability lumbopelvic region. Pt is very concerned that her weight is a major contributor to her pain. We discussed that this is going to contribute and if she chooses to persue weight loss surgery to let us know and we can help her prepare.    Personal Factors and Comorbidities  Comorbidity 1    Comorbidities  obesity, PTSD, anxiety, chronic pain    Examination-Activity Limitations  Bathing;Locomotion Level;Bed Mobility;Bend;Sit;Caring for Others;Sleep;Squat;Dressing;Stairs;Stand;Lift    Examination-Participation Restrictions  Meal Prep;Cleaning;Community Activity;Laundry    Stability/Clinical Decision Making  Evolving/Moderate complexity    Clinical Decision Making  Moderate    Rehab Potential  Good    PT Frequency  2x / week    PT Duration  6 weeks    PT Treatment/Interventions  ADLs/Self Care Home Management;Cryotherapy;Electrical Stimulation;Ultrasound;Traction;Moist Heat;Iontophoresis 4mg /ml Dexamethasone;Gait training;Stair  training;Functional mobility training;Therapeutic activities;Therapeutic exercise;Balance training;Patient/family education;Neuromuscular re-education;Manual techniques;Passive range of motion;Dry needling;Spinal Manipulations;Taping;Joint Manipulations    PT Next Visit Plan  consider traction, STM    PT Home Exercise Plan  figure 4, tennis ball  roll    Consulted and Agree with Plan of Care  Patient       Patient will benefit from skilled therapeutic intervention in order to improve the following deficits and impairments:  Abnormal gait, Difficulty walking, Increased muscle spasms, Pain, Decreased activity tolerance, Decreased endurance, Hypomobility, Impaired flexibility, Improper body mechanics, Decreased strength, Impaired sensation, Postural dysfunction  Visit Diagnosis: Chronic bilateral low back pain with right-sided sciatica - Plan: PT plan of care cert/re-cert  Difficulty in walking, not elsewhere classified - Plan: PT plan of care cert/re-cert     Problem List Patient Active Problem List   Diagnosis Date Noted  . PTSD (post-traumatic stress disorder) 07/21/2018  . GAD (generalized anxiety disorder) 07/21/2018  . Insomnia 07/21/2018  . MDD (major depressive disorder) 07/21/2018  . Nightmares 07/21/2018  . Insulin resistance 10/21/2017  . Other hyperlipidemia 10/07/2017  . Vitamin D deficiency 10/07/2017  . Bipolar depression ([place]) 04/27/2017  . Class 3 obesity with serious comorbidity and body mass index (BMI) of 40.0 to 44.9 in adult 04/27/2017  . Residual foreign body in soft tissue 11/12/2016  . Prediabetes 11/02/2016  . Morbid obesity ([place]) 10/15/2016  . Bipolar 1 disorder ([place])     Gautham Hewins C. Janayah Zavada PT, DPT 06/01/19 2:07 PM   [facility] [address] [place], [place], [address] Phone: [phone]   Fax:  [phone]  Name: Caroline Gonzales MRN: [id number] Date of Birth: March 21, 1976

## 2019-06-05 ENCOUNTER — Other Ambulatory Visit: Payer: Self-pay

## 2019-06-05 ENCOUNTER — Ambulatory Visit (INDEPENDENT_AMBULATORY_CARE_PROVIDER_SITE_OTHER): Payer: 59 | Admitting: Family Medicine

## 2019-06-05 ENCOUNTER — Encounter (INDEPENDENT_AMBULATORY_CARE_PROVIDER_SITE_OTHER): Payer: Self-pay | Admitting: Family Medicine

## 2019-06-05 VITALS — BP 135/78 | HR 91 | Temp 98.1°F | Ht 65.0 in | Wt 288.0 lb

## 2019-06-05 DIAGNOSIS — E559 Vitamin D deficiency, unspecified: Secondary | ICD-10-CM

## 2019-06-05 DIAGNOSIS — Z6841 Body Mass Index (BMI) 40.0 and over, adult: Secondary | ICD-10-CM | POA: Diagnosis not present

## 2019-06-05 DIAGNOSIS — R7303 Prediabetes: Secondary | ICD-10-CM

## 2019-06-06 ENCOUNTER — Ambulatory Visit: Payer: 59 | Admitting: Physical Therapy

## 2019-06-06 ENCOUNTER — Encounter: Payer: Self-pay | Admitting: Physical Therapy

## 2019-06-06 DIAGNOSIS — G8929 Other chronic pain: Secondary | ICD-10-CM | POA: Diagnosis not present

## 2019-06-06 DIAGNOSIS — R262 Difficulty in walking, not elsewhere classified: Secondary | ICD-10-CM

## 2019-06-06 DIAGNOSIS — M5441 Lumbago with sciatica, right side: Secondary | ICD-10-CM

## 2019-06-06 NOTE — Therapy (Signed)
[place], [place], [address] Phone: [phone]   Fax:  [phone]  Physical Therapy Treatment  Patient Details  Name: Caroline Gonzales MRN: [id number] Date of Birth: 10/05/1975 Referring Provider (PT): Leonie Green, NP   Encounter Date: 06/06/2019  PT End of Session - 06/06/19 1628    Visit Number  2    Number of Visits  13    Date for PT Re-Evaluation  07/14/19    Authorization Type  [facility] U[facility]    PT Start Time  1628    PT Stop Time  1713    PT Time Calculation (min)  45 min    Activity Tolerance  Patient tolerated treatment well    Behavior During Therapy  [facility] for tasks assessed/performed       Past Medical History:  Diagnosis Date  . Allergy    Zyrtec, Allegra.  . Anxiety    followed by Dr. Toy Cookey  . Back pain   . Bipolar 1 disorder ([place])   . Complication of anesthesia   . Depression   . Fibroid   . Gallbladder problem   . Gastroparesis 09/14/2012   gastric emptying study in 2014  . GERD (gastroesophageal reflux disease)   . Headache   . Pneumonia    2013ish  . PONV (postoperative nausea and vomiting)   . Pre-diabetes   . PTSD (post-traumatic stress disorder)   . Sleep apnea    do not use a cpap  . Tinnitus   . Vitamin D deficiency   . Wears glasses     Past Surgical History:  Procedure Laterality Date  . ANKLE ARTHROSCOPY Left 2011  . CESAREAN SECTION    . CHOLECYSTECTOMY    . DILATION AND CURETTAGE OF UTERUS    . FOREIGN BODY REMOVAL Left 11/18/2016   Procedure: REMOVAL FOREIGN BODY EXTREMITY LEFT FOOT;  Surgeon: Trula Slade, DPM;  Location: [place];  Service: Podiatry;  Laterality: Left;  . PILONIDAL CYST EXCISION  1990's  . RADIOLOGY WITH ANESTHESIA N/A 11/10/2018   Procedure: MRI WITH ANESTHESIA OF BRAIN AND ORBITS WITH AND WITHOUT CONTRAST;  Surgeon: Radiologist, Medication, MD;  Location: [place];  Service: Radiology;  Laterality: N/A;  . TENDON REPAIR Left 2011   Left  Ankle  . WISDOM TOOTH EXTRACTION  19090's    There were no vitals filed for this visit.  Subjective Assessment - 06/06/19 1718    Subjective  "I am having an increased spasm in the back and its going down my R leg really bad"    Currently in Pain?  Yes    Pain Score  8     Pain Orientation  Right;Left    Pain Descriptors / Indicators  Aching    Pain Type  Chronic pain    Pain Onset  More than a month ago    Pain Frequency  Intermittent                       [facility] Adult PT Treatment/Exercise - 06/06/19 1719      Self-Care   Self-Care  Posture    Posture  proper form utilzing log roll and moving from supin <> sit and protecting the back       Exercises   Exercises  Lumbar      Lumbar Exercises: Stretches   Prone on Elbows Stretch  --   2 x 20    Prone on Elbows Stretch Limitations  reported centralization with increased back pain    Press Ups  2 reps;20 reps      Lumbar Exercises: Supine   Ab Set  10 reps;5 seconds   in decompressed position     Modalities   Modalities  Electrical Stimulation      Electrical Stimulation   Electrical Stimulation Location  R low back    Electrical Stimulation Action  IFC    Electrical Stimulation Parameters  L11 x 15 min 100% scan, 5000 carrier signal    Electrical Stimulation Goals  Pain      Manual Therapy   Manual Therapy  Manual Traction;Soft tissue mobilization    Manual therapy comments  skilled palpation and monitoring of pt throughout TPDN    Soft tissue mobilization  IASTM along the R lumbar paraspinals    Manual Traction  trialed gentle bil LAD in decompression position   ~5-10# of pull pt reported some tightness in the back      Trigger Point Dry Needling - 06/06/19 0001    Consent Given?  Yes    Education Handout Provided  Yes    Muscles Treated Back/Hip  Erector spinae    Erector spinae Response  Palpable increased muscle length;Twitch response elicited   R lumbar           PT Education -  06/06/19 1643    Education Details  proper posture with bed mobility. Educated about TPDN and benefits of treatment.    Person(s) Educated  Patient    Methods  Explanation;Verbal cues    Comprehension  Verbalized understanding;Verbal cues required          PT Long Term Goals - 06/01/19 1400      PT LONG TERM GOAL #1   Title  pt will be able to fold laundry without taking a break    Baseline  severe pain while standing at eval    Time  6    Period  Weeks    Status  New    Target Date  07/14/19      PT LONG TERM GOAL #2   Title  will tolerate ambulation from hospital to car without limitation by back pain    Baseline  severe pain at eval    Time  6    Period  Weeks    Status  New    Target Date  07/14/19      PT LONG TERM GOAL #3   Title  centralization of peripheralized pain    Baseline  down to medial maleolus at eval    Time  6    Period  Weeks    Status  New    Target Date  07/14/19      PT LONG TERM GOAL #4   Title  proper hip hinge to support spine while washing dishes    Baseline  guarded with hip hinge and pain at eval    Time  6    Period  Weeks    Status  New    Target Date  07/14/19            Plan - 06/06/19 1629    Clinical Impression Statement  pt presents with increased pain in the back and is radiating down the RLE rated at a 8/10 pain today. focused on pain relief today and worked on prone extension which she did respond with centralization. Educated and pt provided consent for DN only in the RLE, pt noted increased soreness in  the back and down the leg so DN was halted. focused on core activation in decompression position with E-stim to calm down pain.    Examination-Activity Limitations  Bathing;Locomotion Level;Bed Mobility;Bend;Sit;Caring for Others;Sleep;Squat;Dressing;Stairs;Stand;Lift    Rehab Potential  Good    PT Treatment/Interventions  ADLs/Self Care Home Management;Cryotherapy;Electrical Stimulation;Ultrasound;Traction;Moist  Heat;Iontophoresis 4mg /ml Dexamethasone;Gait training;Stair training;Functional mobility training;Therapeutic activities;Therapeutic exercise;Balance training;Patient/family education;Neuromuscular re-education;Manual techniques;Passive range of motion;Dry needling;Spinal Manipulations;Taping;Joint Manipulations    PT Next Visit Plan  try prone with pillows under chest to promote extension and work on STW along R paraspinals, e-stim to calm down pain, core activation, how is she responding  to extension biase, pain education    PT Home Exercise Plan  figure 4, tennis ball roll, prone on elbows, prone press up, ab set    Consulted and Agree with Plan of Care  Patient       Patient will benefit from skilled therapeutic intervention in order to improve the following deficits and impairments:  Abnormal gait, Difficulty walking, Increased muscle spasms, Pain, Decreased activity tolerance, Decreased endurance, Hypomobility, Impaired flexibility, Improper body mechanics, Decreased strength, Impaired sensation, Postural dysfunction  Visit Diagnosis: Chronic bilateral low back pain with right-sided sciatica  Difficulty in walking, not elsewhere classified     Problem List Patient Active Problem List   Diagnosis Date Noted  . PTSD (post-traumatic stress disorder) 07/21/2018  . GAD (generalized anxiety disorder) 07/21/2018  . Insomnia 07/21/2018  . MDD (major depressive disorder) 07/21/2018  . Nightmares 07/21/2018  . Insulin resistance 10/21/2017  . Other hyperlipidemia 10/07/2017  . Vitamin D deficiency 10/07/2017  . Bipolar depression ([place]) 04/27/2017  . Class 3 obesity with serious comorbidity and body mass index (BMI) of 40.0 to 44.9 in adult 04/27/2017  . Residual foreign body in soft tissue 11/12/2016  . Prediabetes 11/02/2016  . Morbid obesity ([place]) 10/15/2016  . Bipolar 1 disorder ([facility])    Starr Lake PT, DPT, LAT, ATC  06/06/19  5:35 PM     [facility] [address] [place], [place], [address] Phone: [phone]   Fax:  [phone]  Name: Caroline Gonzales MRN: [id number] Date of Birth: 1976/06/18

## 2019-06-07 DIAGNOSIS — F4310 Post-traumatic stress disorder, unspecified: Secondary | ICD-10-CM | POA: Diagnosis not present

## 2019-06-07 NOTE — Progress Notes (Signed)
Office: [phone]  /  Fax: [phone]   HPI:   Chief Complaint: OBESITY Caroline Gonzales is here to discuss her progress with her obesity treatment plan. She is on the portion control better and make smarter food choices, such as increase vegetables and decrease simple carbohydrates and is following her eating plan approximately 0 % of the time. She states she is walking for 20 minutes 5 times per week. Caroline Gonzales has returned to our clinic after 2 month hiatus, to recommit to the program. She is looking for very poignant strict plan to help her get back on track. She has felt more short of breath with minimal exertion.  Her weight is 288 lb (130.6 kg) today and has gained 4 lbs since her last visit. She has lost 0 lbs since starting treatment with [name].  Vitamin D Deficiency Caroline Gonzales has a diagnosis of vitamin D deficiency. She is currently taking OTC Vit D. She notes fatigue and denies nausea, vomiting or muscle weakness.  Pre-Diabetes Caroline Gonzales has a diagnosis of pre-diabetes based on her elevated Hgb A1c and was informed this puts her at greater risk of developing diabetes. Last Hgb A1c was of 5.8 and insulin of 36.2. She denies GI side effects of metformin and continues to work on diet and exercise to decrease risk of diabetes. She denies hypoglycemia.  ASSESSMENT AND PLAN:  Vitamin D deficiency  Prediabetes  Class 3 severe obesity with serious comorbidity and body mass index (BMI) of 45.0 to 49.9 in adult, unspecified obesity type ([place])  PLAN:  Vitamin D Deficiency Caroline Gonzales was informed that low vitamin D levels contributes to fatigue and are associated with obesity, breast, and colon cancer. Caroline Gonzales agrees to continue taking OTC Vit D 5,000 IU daily and will follow up for routine testing of vitamin D, at least 2-3 times per year. She was informed of the risk of over-replacement of vitamin D and agrees to not increase her dose unless she discusses this with [name] first. Caroline Gonzales agrees to follow  up with our clinic in 2 weeks.  Pre-Diabetes Caroline Gonzales will continue to work on weight loss, exercise, and decreasing simple carbohydrates in her diet to help decrease the risk of diabetes. We dicussed metformin including benefits and risks. She was informed that eating too many simple carbohydrates or too many calories at one sitting increases the likelihood of GI side effects. Caroline Gonzales agrees to continue taking metformin, and we will repeat labs in 1 month. Caroline Gonzales agrees to follow up with our clinic in 2 weeks as directed to monitor her progress.  Caroline Gonzales spent > than 50% of the 15 minute visit on counseling as documented in the note.  Obesity Caroline Gonzales is currently in the action stage of change. As such, her goal is to get back to weightloss efforts  She has agreed to follow the Category 3 plan Caroline Gonzales has been instructed to work up to a goal of 150 minutes of combined cardio and strengthening exercise per week for weight loss and overall health benefits. We discussed the following Behavioral Modification Strategies today: increasing lean protein intake, increasing vegetables and work on meal planning and easy cooking plans, keeping healthy foods in the home, and planning for success   Caroline Gonzales has agreed to follow up with our clinic in 2 weeks. She was informed of the importance of frequent follow up visits to maximize her success with intensive lifestyle modifications for her multiple health conditions.  ALLERGIES: Allergies  Allergen Reactions   Penicillins Shortness Of Breath and Rash  Did it involve swelling of the face/tongue/throat, SOB, or low BP? Yes Did it involve sudden or severe rash/hives, skin peeling, or any reaction on the inside of your mouth or nose? Yes Did you need to seek medical attention at a hospital or doctor's office? Yes When did it last happen?childhood allergy If all above answers are NO, may proceed with cephalosporin use.     Tamiflu [Oseltamivir  Phosphate] Anaphylaxis   Tamiflu [Oseltamivir Phosphate] Anaphylaxis   Cephalosporins Rash   Latex Rash    MEDICATIONS: Current Outpatient Medications on File Prior to Visit  Medication Sig Dispense Refill   cetirizine (ZYRTEC) 10 MG tablet Take 10 mg by mouth daily.     Cholecalciferol (VITAMIN D3) 5000 units CAPS Take 5,000 Units by mouth at bedtime.     clonazePAM (KLONOPIN) 0.5 MG tablet TAKE 1 TABLET BY MOUTH EVERY 6 HOURS AS NEEDED FOR ANXIETY 120 tablet 1   gabapentin (NEURONTIN) 300 MG capsule Take 1 capsule (300 mg total) by mouth at bedtime. (Patient taking differently: Take 300 mg by mouth 3 (three) times daily. ) 90 capsule 0   ibuprofen (ADVIL,MOTRIN) 200 MG tablet Take 400 mg by mouth daily as needed for headache or moderate pain.     L-Methylfolate-Algae (DEPLIN 15) 15-90.314 MG CAPS Take 1 capsule by mouth at bedtime.      lamoTRIgine (LAMICTAL) 150 MG tablet TAKE 1 TABLET BY MOUTH TWICE DAILY 180 tablet 0   levonorgestrel (MIRENA) 20 MCG/24HR IUD 1 each by Intrauterine route once.      meclizine (ANTIVERT) 25 MG tablet Take 1 tablet (25 mg total) by mouth 3 (three) times daily as needed for dizziness. 60 tablet 1   metFORMIN (GLUCOPHAGE) 1000 MG tablet Take 1 tablet (1,000 mg total) by mouth daily with breakfast. 90 tablet 0   Multiple Vitamin (MULTIVITAMIN) tablet Take 1 tablet by mouth at bedtime.      pantoprazole (PROTONIX) 40 MG tablet Take 40 mg by mouth at bedtime.      prazosin (MINIPRESS) 1 MG capsule TAKE 1 CAPSULE (1 MG TOTAL) BY MOUTH AT BEDTIME. ADD TO THE 4 MG YOU'RE ALREADY TAKING TO =5 MG. 90 capsule 0   prazosin (MINIPRESS) 2 MG capsule TAKE 2 CAPSULES BY MOUTH AT BEDTIME. 180 capsule 0   propranolol (INDERAL) 40 MG tablet TAKE 1 TABLET BY MOUTH 2 TIMES DAILY. 180 tablet 0   QUEtiapine (SEROQUEL) 200 MG tablet Take 1.5 tablets (300 mg total) by mouth at bedtime. 135 tablet 0   fexofenadine (ALLEGRA) 180 MG tablet Take 180 mg by mouth at  bedtime.     No current facility-administered medications on file prior to visit.     PAST MEDICAL HISTORY: Past Medical History:  Diagnosis Date   Allergy    Zyrtec, Allegra.   Anxiety    followed by Dr. Toy Cookey   Back pain    Bipolar 1 disorder ([place])    Complication of anesthesia    Depression    Fibroid    Gallbladder problem    Gastroparesis 09/14/2012   gastric emptying study in 2014   GERD (gastroesophageal reflux disease)    Headache    Pneumonia    2013ish   PONV (postoperative nausea and vomiting)    Pre-diabetes    PTSD (post-traumatic stress disorder)    Sleep apnea    do not use a cpap   Tinnitus    Vitamin D deficiency    Wears glasses     PAST SURGICAL  HISTORY: Past Surgical History:  Procedure Laterality Date   ANKLE ARTHROSCOPY Left 2011   CESAREAN SECTION     CHOLECYSTECTOMY     DILATION AND CURETTAGE OF UTERUS     FOREIGN BODY REMOVAL Left 11/18/2016   Procedure: REMOVAL FOREIGN BODY EXTREMITY LEFT FOOT;  Surgeon: Trula Slade, DPM;  Location: [facility];  Service: Podiatry;  Laterality: Left;   PILONIDAL CYST EXCISION  1990's   RADIOLOGY WITH ANESTHESIA N/A 11/10/2018   Procedure: MRI WITH ANESTHESIA OF BRAIN AND ORBITS WITH AND WITHOUT CONTRAST;  Surgeon: Radiologist, Medication, MD;  Location: [facility];  Service: Radiology;  Laterality: N/A;   TENDON REPAIR Left 2011   Left Ankle   WISDOM TOOTH EXTRACTION  19090's    SOCIAL HISTORY: Social History   Tobacco Use   Smoking status: Former Smoker    Years: 10.00    Types: Cigarettes    Quit date: 2003    Years since quitting: 17.7   Smokeless tobacco: Never Used  Substance Use Topics   Alcohol use: Yes    Alcohol/week: 0.0 standard drinks    Comment: rarely   Drug use: No    FAMILY HISTORY: Family History  Problem Relation Age of Onset   Cancer Mother        squamous cell carcinoma   Hyperlipidemia Mother    Depression Mother    Anxiety disorder  Mother    Obesity Mother    Heart disease Father 64       cardiomegaly, CHF; steroid use.   Hyperlipidemia Father    Hypertension Father    Mental retardation Father    High blood pressure Father    Depression Father    Anxiety disorder Father    Obesity Father    Cancer Maternal Grandmother    Diabetes Maternal Grandmother    Heart disease Maternal Grandmother    Hyperlipidemia Maternal Grandmother    Hypertension Maternal Grandmother    Stroke Maternal Grandmother    Heart disease Paternal Grandmother    Hypertension Paternal Grandmother    Heart disease Paternal Grandfather    Hyperlipidemia Paternal Grandfather    Mental illness Paternal Grandfather    Rheum arthritis Sister    Multiple sclerosis Sister    Breast cancer Maternal Aunt    Thyroid cancer Paternal Aunt     ROS: Review of Systems  Constitutional: Positive for malaise/fatigue. Negative for weight loss.  Gastrointestinal: Negative for nausea and vomiting.  Musculoskeletal:       Negative muscle weakness  Endo/Heme/Allergies:       Negative hypoglycemia    PHYSICAL EXAM: Blood pressure 135/78, pulse 91, temperature 98.1 F (36.7 C), temperature source Oral, height 5\' 5"  (1.651 m), weight 288 lb (130.6 kg), SpO2 97 %. Body mass index is 47.93 kg/m. Physical Exam Vitals signs reviewed.  Constitutional:      Appearance: Normal appearance. She is obese.  Cardiovascular:     Rate and Rhythm: Normal rate.     Pulses: Normal pulses.  Pulmonary:     Effort: Pulmonary effort is normal.     Breath sounds: Normal breath sounds.  Musculoskeletal: Normal range of motion.  Skin:    General: Skin is warm and dry.  Neurological:     Mental Status: She is alert and oriented to person, place, and time.  Psychiatric:        Mood and Affect: Mood normal.        Behavior: Behavior normal.     RECENT LABS  AND TESTS: BMET    Component Value Date/Time   NA 135 04/07/2019 0606   NA 138  10/10/2018 0829   K 4.0 04/07/2019 0606   CL 102 04/07/2019 0606   CO2 23 04/07/2019 0606   GLUCOSE 109 (H) 04/07/2019 0606   BUN 14 04/07/2019 0606   BUN 12 10/10/2018 0829   CREATININE 1.00 04/07/2019 0606   CREATININE 0.95 06/27/2016 0933   CALCIUM 8.7 (L) 04/07/2019 0606   GFRNONAA >60 04/07/2019 0606   GFRAA >60 04/07/2019 0606   Lab Results  Component Value Date   HGBA1C 5.8 (H) 03/22/2019   HGBA1C 5.2 10/10/2018   HGBA1C 5.3 05/25/2018   HGBA1C 5.4 02/10/2018   HGBA1C 5.5 10/07/2017   Lab Results  Component Value Date   INSULIN 36.2 (H) 03/22/2019   INSULIN 21.4 10/10/2018   INSULIN 87.4 (H) 05/25/2018   INSULIN 20.3 02/10/2018   INSULIN 15.6 10/07/2017   CBC    Component Value Date/Time   WBC 8.3 04/07/2019 0606   RBC 4.07 04/07/2019 0606   HGB 11.7 (L) 04/07/2019 0606   HGB 11.2 06/22/2017 0904   HCT 37.1 04/07/2019 0606   HCT 34.8 06/22/2017 0904   PLT 253 04/07/2019 0606   PLT 332 11/12/2016 1651   MCV 91.2 04/07/2019 0606   MCV 86 06/22/2017 0904   MCH 28.7 04/07/2019 0606   MCHC 31.5 04/07/2019 0606   RDW 14.7 04/07/2019 0606   RDW 15.7 (H) 06/22/2017 0904   LYMPHSABS 2.0 04/07/2019 0606   LYMPHSABS 1.5 06/22/2017 0904   MONOABS 0.8 04/07/2019 0606   EOSABS 0.2 04/07/2019 0606   EOSABS 0.2 06/22/2017 0904   BASOSABS 0.0 04/07/2019 0606   BASOSABS 0.0 06/22/2017 0904   Iron/TIBC/Ferritin/ %Sat    Component Value Date/Time   IRON 53 08/31/2016 1644   TIBC 280 08/31/2016 1644   FERRITIN 36 08/31/2016 1644   IRONPCTSAT 19 08/31/2016 1644   Lipid Panel     Component Value Date/Time   CHOL 157 03/22/2019 1607   TRIG 231 (H) 03/22/2019 1607   HDL 41 03/22/2019 1607   CHOLHDL 3.5 02/10/2018 0749   CHOLHDL 4.1 12/01/2015 0914   VLDL 37 (H) 12/01/2015 0914   LDLCALC 70 03/22/2019 1607   Hepatic Function Panel     Component Value Date/Time   PROT 7.3 04/07/2019 0606   PROT 6.9 10/10/2018 0829   ALBUMIN 3.4 (L) 04/07/2019 0606   ALBUMIN  3.9 10/10/2018 0829   AST 19 04/07/2019 0606   ALT 19 04/07/2019 0606   ALKPHOS 77 04/07/2019 0606   BILITOT 0.2 (L) 04/07/2019 0606   BILITOT <0.2 10/10/2018 0829      Component Value Date/Time   TSH 1.620 08/31/2016 1644   TSH 1.83 12/01/2015 0914      OBESITY BEHAVIORAL INTERVENTION VISIT  Today's visit was # 6   Starting weight: 278 lbs Starting date: 08/31/16 Today's weight : 288 lbs Today's date: 06/05/2019 Total lbs lost to date: 0    ASK: We discussed the diagnosis of obesity with Caroline Gonzales today and Caroline Gonzales agreed to give [name] permission to discuss obesity behavioral modification therapy today.  ASSESS: Caroline Gonzales has the diagnosis of obesity and her BMI today is 38.93 Caroline Gonzales is in the action stage of change   ADVISE: Caroline Gonzales was educated on the multiple health risks of obesity as well as the benefit of weight loss to improve her health. She was advised of the need for long term treatment and the  importance of lifestyle modifications to improve her current health and to decrease her risk of future health problems.  AGREE: Multiple dietary modification options and treatment options were discussed and  Caroline Gonzales agreed to follow the recommendations documented in the above note.  ARRANGE: Caroline Gonzales was educated on the importance of frequent visits to treat obesity as outlined per CMS and USPSTF guidelines and agreed to schedule her next follow up appointment today.  Caroline Gonzales, Trixie Dredge, am acting as transcriptionist for Ilene Qua, MD  Caroline Gonzales have reviewed the above documentation for accuracy and completeness, and Caroline Gonzales agree with the above. - Ilene Qua, MD

## 2019-06-14 ENCOUNTER — Ambulatory Visit: Payer: 59 | Admitting: Physical Therapy

## 2019-06-14 DIAGNOSIS — F4310 Post-traumatic stress disorder, unspecified: Secondary | ICD-10-CM | POA: Diagnosis not present

## 2019-06-15 ENCOUNTER — Other Ambulatory Visit: Payer: Self-pay | Admitting: Physician Assistant

## 2019-06-15 MED FILL — lamoTRIgine 150 MG TABS: 150 | 30 days supply | Qty: 60 | Fill #2

## 2019-06-15 MED FILL — metFORMIN HCL 1000 MG TABS: 1000 | 90 days supply | Qty: 90 | Fill #0

## 2019-06-16 ENCOUNTER — Ambulatory Visit: Payer: 59 | Admitting: Physical Therapy

## 2019-06-16 ENCOUNTER — Telehealth: Payer: Self-pay | Admitting: Physician Assistant

## 2019-06-16 MED FILL — PRAZOSIN 1 MG CAPSULE: 1 | 90 days supply | Qty: 90 | Fill #0

## 2019-06-16 MED FILL — clonazePAM 0.5 MG TABS: 0.5 | 30 days supply | Qty: 120 | Fill #0

## 2019-06-16 MED FILL — QUETIAPINE FUMARATE 200 MG: 200 | 90 days supply | Qty: 135 | Fill #0

## 2019-06-16 MED FILL — PROPRANOLOL 40 MG TABLET: 40 | 90 days supply | Qty: 180 | Fill #0

## 2019-06-16 MED FILL — PRAZOSIN 2 MG CAPSULE: 2 | 90 days supply | Qty: 180 | Fill #0

## 2019-06-16 NOTE — Telephone Encounter (Signed)
Updated Rx to q 6 hours.

## 2019-06-16 NOTE — Telephone Encounter (Signed)
Pharmacy called and left a message stating that they need a clarification on the clonazapam 0.5  Mg that was called indirections said take one every 8 hours as needed. Pharmacy says that she usually takes it every 6 hours as needed. Please call the pharmacy at [phone]

## 2019-06-20 ENCOUNTER — Ambulatory Visit: Payer: 59 | Admitting: Physical Therapy

## 2019-06-21 ENCOUNTER — Other Ambulatory Visit (INDEPENDENT_AMBULATORY_CARE_PROVIDER_SITE_OTHER): Payer: Self-pay

## 2019-06-21 ENCOUNTER — Other Ambulatory Visit: Payer: Self-pay

## 2019-06-21 ENCOUNTER — Ambulatory Visit (INDEPENDENT_AMBULATORY_CARE_PROVIDER_SITE_OTHER): Payer: 59 | Admitting: Family Medicine

## 2019-06-21 ENCOUNTER — Encounter (INDEPENDENT_AMBULATORY_CARE_PROVIDER_SITE_OTHER): Payer: Self-pay | Admitting: Family Medicine

## 2019-06-21 VITALS — BP 105/69 | HR 84 | Temp 98.1°F | Ht 65.0 in | Wt 288.0 lb

## 2019-06-21 DIAGNOSIS — E7849 Other hyperlipidemia: Secondary | ICD-10-CM | POA: Diagnosis not present

## 2019-06-21 DIAGNOSIS — R7303 Prediabetes: Secondary | ICD-10-CM

## 2019-06-21 DIAGNOSIS — Z6841 Body Mass Index (BMI) 40.0 and over, adult: Secondary | ICD-10-CM | POA: Diagnosis not present

## 2019-06-21 DIAGNOSIS — F4310 Post-traumatic stress disorder, unspecified: Secondary | ICD-10-CM | POA: Diagnosis not present

## 2019-06-22 ENCOUNTER — Ambulatory Visit: Payer: 59 | Attending: Neurosurgery | Admitting: Physical Therapy

## 2019-06-22 DIAGNOSIS — G8929 Other chronic pain: Secondary | ICD-10-CM | POA: Insufficient documentation

## 2019-06-22 DIAGNOSIS — R262 Difficulty in walking, not elsewhere classified: Secondary | ICD-10-CM | POA: Insufficient documentation

## 2019-06-22 DIAGNOSIS — M5441 Lumbago with sciatica, right side: Secondary | ICD-10-CM | POA: Diagnosis not present

## 2019-06-22 NOTE — Therapy (Signed)
[place], [place], [address] Phone: [phone]   Fax:  [phone]  Physical Therapy Treatment  Patient Details  Name: Caroline Gonzales MRN: [id number] Date of Birth: May 13, 1976 Referring Provider (PT): Leonie Green, NP   Encounter Date: 06/22/2019  PT End of Session - 06/22/19 1631    Visit Number  3    Number of Visits  13    Date for PT Re-Evaluation  07/14/19    Authorization Type  [facility]    PT Start Time  1625    PT Stop Time  1708    PT Time Calculation (min)  43 min    Activity Tolerance  Patient tolerated treatment well    Behavior During Therapy  [facility] for tasks assessed/performed       Past Medical History:  Diagnosis Date  . Allergy    Zyrtec, Allegra.  . Anxiety    followed by Dr. Toy Cookey  . Back pain   . Bipolar 1 disorder ([place])   . Complication of anesthesia   . Depression   . Fibroid   . Gallbladder problem   . Gastroparesis 09/14/2012   gastric emptying study in 2014  . GERD (gastroesophageal reflux disease)   . Headache   . Pneumonia    2013ish  . PONV (postoperative nausea and vomiting)   . Pre-diabetes   . PTSD (post-traumatic stress disorder)   . Sleep apnea    do not use a cpap  . Tinnitus   . Vitamin D deficiency   . Wears glasses     Past Surgical History:  Procedure Laterality Date  . ANKLE ARTHROSCOPY Left 2011  . CESAREAN SECTION    . CHOLECYSTECTOMY    . DILATION AND CURETTAGE OF UTERUS    . FOREIGN BODY REMOVAL Left 11/18/2016   Procedure: REMOVAL FOREIGN BODY EXTREMITY LEFT FOOT;  Surgeon: Trula Slade, DPM;  Location: [facility];  Service: Podiatry;  Laterality: Left;  . PILONIDAL CYST EXCISION  1990's  . RADIOLOGY WITH ANESTHESIA N/A 11/10/2018   Procedure: MRI WITH ANESTHESIA OF BRAIN AND ORBITS WITH AND WITHOUT CONTRAST;  Surgeon: Radiologist, Medication, MD;  Location: [facility];  Service: Radiology;  Laterality: N/A;  . TENDON REPAIR Left 2011   Left  Ankle  . WISDOM TOOTH EXTRACTION  190[address]'s    There were no vitals filed for this visit.  Subjective Assessment - 06/22/19 1709    Subjective  " I am doing really much better today, maybe a 1/10 pain. After the last session it took 3 days before it really calmed down, i almost didn't come today"    Patient Stated Goals  fold laundry, walk to car from hospital, stand to do dishes, chores    Currently in Pain?  Yes    Pain Score  1     Pain Location  Hip    Pain Orientation  Right    Pain Descriptors / Indicators  Aching;Sore    Pain Type  Chronic pain    Aggravating Factors   stretching piriformis,    Pain Relieving Factors  bending forward         [facility] PT Assessment - 06/22/19 0001      Assessment   Medical Diagnosis  lumbar DDD    Referring Provider (PT)  Leonie Green, NP                   [facility] Adult PT Treatment/Exercise - 06/22/19 0001  Lumbar Exercises: Stretches   Piriformis Stretch  2 reps;30 seconds   gentle    Piriformis Stretch Limitations  reported increased soreness      Lumbar Exercises: Aerobic   Nustep  L5 x 5 min UE/LE      Lumbar Exercises: Standing   Other Standing Lumbar Exercises  standing pressing ball down with bil UE breathing out as she push down and in with she relaxed 1 x 10       Lumbar Exercises: Supine   Ab Set  10 reps;5 reps    Heel Slides  --    Bent Knee Raise  15 reps   alternating L/R x 2 sets   Other Supine Lumbar Exercises  pushing ball down with bil UE onto legs 1 x 10 holding 2-3 seconds      Manual Therapy   Manual Therapy  Neural Stretch    Manual therapy comments  MTPR along the R piriformis x 1     Manual Traction  LAD on the RLE grade II    Neural Stretch  Sciatic nerve flossing in sitting             PT Education - 06/22/19 1726    Education Details  updated HEP today    Person(s) Educated  Patient    Methods  Explanation;Verbal cues    Comprehension  Verbalized understanding;Verbal cues  required          PT Long Term Goals - 06/01/19 1400      PT LONG TERM GOAL #1   Title  pt will be able to fold laundry without taking a break    Baseline  severe pain while standing at eval    Time  6    Period  Weeks    Status  New    Target Date  07/14/19      PT LONG TERM GOAL #2   Title  will tolerate ambulation from hospital to car without limitation by back pain    Baseline  severe pain at eval    Time  6    Period  Weeks    Status  New    Target Date  07/14/19      PT LONG TERM GOAL #3   Title  centralization of peripheralized pain    Baseline  down to medial maleolus at eval    Time  6    Period  Weeks    Status  New    Target Date  07/14/19      PT LONG TERM GOAL #4   Title  proper hip hinge to support spine while washing dishes    Baseline  guarded with hip hinge and pain at eval    Time  6    Period  Weeks    Status  New    Target Date  07/14/19            Plan - 06/22/19 1716    Clinical Impression Statement  pt arrived today reporting significant improvement since 3 days after the last visit which she attributes is from the TPDN. focused session on gentle core strengthening and STW along the piriformis due to the area being sensitive and easily aggrivated. had originally planned to trial mechanical traction but due to sensitivity held off today.  end of session she reported no pain and declined modalities.    PT Treatment/Interventions  ADLs/Self Care Home Management;Cryotherapy;Electrical Stimulation;Ultrasound;Traction;Moist Heat;Iontophoresis 4mg /ml Dexamethasone;Gait training;Stair training;Functional mobility training;Therapeutic activities;Therapeutic exercise;Balance training;Patient/family  education;Neuromuscular re-education;Manual techniques;Passive range of motion;Dry needling;Spinal Manipulations;Taping;Joint Manipulations    PT Next Visit Plan  gentle core/ hip strengthening due to pain easily aggrivated. focus on flexion based exercises.     PT Home Exercise Plan  figure 4, tennis ball roll, prone on elbows, prone press up, ab set    Consulted and Agree with Plan of Care  Patient       Patient will benefit from skilled therapeutic intervention in order to improve the following deficits and impairments:  Abnormal gait, Difficulty walking, Increased muscle spasms, Pain, Decreased activity tolerance, Decreased endurance, Hypomobility, Impaired flexibility, Improper body mechanics, Decreased strength, Impaired sensation, Postural dysfunction  Visit Diagnosis: Chronic bilateral low back pain with right-sided sciatica  Difficulty in walking, not elsewhere classified     Problem List Patient Active Problem List   Diagnosis Date Noted  . PTSD (post-traumatic stress disorder) 07/21/2018  . GAD (generalized anxiety disorder) 07/21/2018  . Insomnia 07/21/2018  . MDD (major depressive disorder) 07/21/2018  . Nightmares 07/21/2018  . Insulin resistance 10/21/2017  . Other hyperlipidemia 10/07/2017  . Vitamin D deficiency 10/07/2017  . Bipolar depression ([place]) 04/27/2017  . Class 3 obesity with serious comorbidity and body mass index (BMI) of 40.0 to 44.9 in adult 04/27/2017  . Residual foreign body in soft tissue 11/12/2016  . Prediabetes 11/02/2016  . Morbid obesity ([place]) 10/15/2016  . Bipolar 1 disorder ([facility])    Starr Lake PT, DPT, LAT, ATC  06/22/19  5:27 PM      [facility] [address] [place], [place], [address] Phone: [phone]   Fax:  [phone]  Name: Caroline Gonzales MRN: [id number] Date of Birth: 12-May-1976

## 2019-06-22 NOTE — Progress Notes (Signed)
Office: [phone]  /  Fax: [phone]   HPI:   Chief Complaint: OBESITY Caroline Gonzales is here to discuss her progress with her obesity treatment plan. She is on the Category 3 plan and is following her eating plan approximately 50 % of the time. She states she is walking for 20 minutes 5 times per week. Caroline Gonzales voices the first week went really well but the last week her friend committed suicide and she ate indulgently secondary to emotional eating. She is seeing a Social worker today. She doesn't like lunch meat so she is substituting eggs for lunch meat. She is occasionally skipping lunch secondary to dislike of meat. Her weight is 288 lb (130.6 kg) today and has not lost weight since her last visit. She has lost 0 lbs since starting treatment with [name].  Pre-Diabetes Caroline Gonzales has a diagnosis of pre-diabetes based on her elevated Hgb A1c and was informed this puts her at greater risk of developing diabetes. She is on metformin and denies GI side effects. She is still experiencing significant carbohydrate cravings. She continues to work on diet and exercise to decrease risk of diabetes. She denies hypoglycemia.  Hyperlipidemia Caroline Gonzales has hyperlipidemia and has been trying to improve her cholesterol levels with intensive lifestyle modification including a low saturated fat diet, exercise and weight loss. Last LDL was of 70 and triglycerides of 231. She is not on statin and denies any chest pain, claudication or myalgias.  ASSESSMENT AND PLAN:  Prediabetes  Other hyperlipidemia  Class 3 severe obesity with serious comorbidity and body mass index (BMI) of 45.0 to 49.9 in adult, unspecified obesity type Caroline Gonzales)  PLAN:  Pre-Diabetes Caroline Gonzales will continue to work on weight loss, exercise, and decreasing simple carbohydrates in her diet to help decrease the risk of diabetes. We dicussed metformin including benefits and risks. She was informed that eating too many simple carbohydrates or too many  calories at one sitting increases the likelihood of GI side effects. Leathia agrees to continue taking metformin, and she agrees to follow up with our clinic in 2 weeks as directed to monitor her progress.  Hyperlipidemia Caroline Gonzales was informed of the American Heart Association Guidelines emphasizing intensive lifestyle modifications as the first line treatment for hyperlipidemia. We discussed many lifestyle modifications today in depth, and Caroline Gonzales will continue to work on decreasing saturated fats such as fatty red meat, butter and many fried foods. She will also increase vegetables and lean protein in her diet and continue to work on exercise and weight loss efforts. We will repeat labs in November.  I spent > than 50% of the 15 minute visit on counseling as documented in the note.  Obesity Caroline Gonzales is currently in the action stage of change. As such, her goal is to continue with weight loss efforts She has agreed to follow the Category 3 plan Caroline Gonzales has been instructed to work up to a goal of 150 minutes of combined cardio and strengthening exercise per week for weight loss and overall health benefits. We discussed the following Behavioral Modification Strategies today: increasing lean protein intake, increasing vegetables and work on meal planning and easy cooking plans, keeping healthy foods in the home, and planning for success   Caroline Gonzales has agreed to follow up with our clinic in 2 weeks. She was informed of the importance of frequent follow up visits to maximize her success with intensive lifestyle modifications for her multiple health conditions.  ALLERGIES: Allergies  Allergen Reactions   Penicillins Shortness Of Breath and Rash  Did it involve swelling of the face/tongue/throat, SOB, or low BP? Yes Did it involve sudden or severe rash/hives, skin peeling, or any reaction on the inside of your mouth or nose? Yes Did you need to seek medical attention at a Gonzales or doctor's  office? Yes When did it last happen?childhood allergy If all above answers are NO, may proceed with cephalosporin use.     Tamiflu [Oseltamivir Phosphate] Anaphylaxis   Tamiflu [Oseltamivir Phosphate] Anaphylaxis   Cephalosporins Rash   Latex Rash    MEDICATIONS: Current Outpatient Medications on File Prior to Visit  Medication Sig Dispense Refill   cetirizine (ZYRTEC) 10 MG tablet Take 10 mg by mouth daily.     Cholecalciferol (VITAMIN D3) 5000 units CAPS Take 5,000 Units by mouth at bedtime.     clonazePAM (KLONOPIN) 0.5 MG tablet TAKE 1 TABLET BY MOUTH EVERY 6 HOURS AS NEEDED FOR ANXIETY 120 tablet 1   gabapentin (NEURONTIN) 300 MG capsule Take 1 capsule (300 mg total) by mouth at bedtime. (Patient taking differently: Take 300 mg by mouth 3 (three) times daily. ) 90 capsule 0   ibuprofen (ADVIL,MOTRIN) 200 MG tablet Take 400 mg by mouth daily as needed for headache or moderate pain.     L-Methylfolate-Algae (DEPLIN 15) 15-90.314 MG CAPS Take 1 capsule by mouth at bedtime.      lamoTRIgine (LAMICTAL) 150 MG tablet TAKE 1 TABLET BY MOUTH TWICE DAILY 180 tablet 0   levonorgestrel (MIRENA) 20 MCG/24HR IUD 1 each by Intrauterine route once.      metFORMIN (GLUCOPHAGE) 1000 MG tablet Take 1 tablet (1,000 mg total) by mouth daily with breakfast. 90 tablet 0   Multiple Vitamin (MULTIVITAMIN) tablet Take 1 tablet by mouth at bedtime.      prazosin (MINIPRESS) 1 MG capsule TAKE 1 CAPSULE BY MOUTH AT BEDTIME. ADD TO THE 4 MG YOU'RE ALREADY TAKING TO =5 MG. 90 capsule 0   prazosin (MINIPRESS) 2 MG capsule TAKE 2 CAPSULES BY MOUTH AT BEDTIME. 180 capsule 0   propranolol (INDERAL) 40 MG tablet TAKE 1 TABLET BY MOUTH 2 TIMES DAILY. 180 tablet 0   QUEtiapine (SEROQUEL) 200 MG tablet TAKE ONE AND A HALF (1.5) TABLETS (300 MG TOTAL) BY MOUTH AT BEDTIME. 135 tablet 0   fexofenadine (ALLEGRA) 180 MG tablet Take 180 mg by mouth at bedtime.     meclizine (ANTIVERT) 25 MG tablet  Take 1 tablet (25 mg total) by mouth 3 (three) times daily as needed for dizziness. 60 tablet 1   pantoprazole (PROTONIX) 40 MG tablet Take 40 mg by mouth at bedtime.      No current facility-administered medications on file prior to visit.     PAST MEDICAL HISTORY: Past Medical History:  Diagnosis Date   Allergy    Zyrtec, Allegra.   Anxiety    followed by Dr. Toy Cookey   Back pain    Bipolar 1 disorder ([place])    Complication of anesthesia    Depression    Fibroid    Gallbladder problem    Gastroparesis 09/14/2012   gastric emptying study in 2014   GERD (gastroesophageal reflux disease)    Headache    Pneumonia    2013ish   PONV (postoperative nausea and vomiting)    Pre-diabetes    PTSD (post-traumatic stress disorder)    Sleep apnea    do not use a cpap   Tinnitus    Vitamin D deficiency    Wears glasses     PAST  SURGICAL HISTORY: Past Surgical History:  Procedure Laterality Date   ANKLE ARTHROSCOPY Left 2011   CESAREAN SECTION     CHOLECYSTECTOMY     DILATION AND CURETTAGE OF UTERUS     FOREIGN BODY REMOVAL Left 11/18/2016   Procedure: REMOVAL FOREIGN BODY EXTREMITY LEFT FOOT;  Surgeon: Trula Slade, DPM;  Location: [facility];  Service: Podiatry;  Laterality: Left;   PILONIDAL CYST EXCISION  1990's   RADIOLOGY WITH ANESTHESIA N/A 11/10/2018   Procedure: MRI WITH ANESTHESIA OF BRAIN AND ORBITS WITH AND WITHOUT CONTRAST;  Surgeon: Radiologist, Medication, MD;  Location: [facility];  Service: Radiology;  Laterality: N/A;   TENDON REPAIR Left 2011   Left Ankle   WISDOM TOOTH EXTRACTION  19090's    SOCIAL HISTORY: Social History   Tobacco Use   Smoking status: Former Smoker    Years: 10.00    Types: Cigarettes    Quit date: 2003    Years since quitting: 17.7   Smokeless tobacco: Never Used  Substance Use Topics   Alcohol use: Yes    Alcohol/week: 0.0 standard drinks    Comment: rarely   Drug use: No    FAMILY HISTORY: Family  History  Problem Relation Age of Onset   Cancer Mother        squamous cell carcinoma   Hyperlipidemia Mother    Depression Mother    Anxiety disorder Mother    Obesity Mother    Heart disease Father 43       cardiomegaly, CHF; steroid use.   Hyperlipidemia Father    Hypertension Father    Mental retardation Father    High blood pressure Father    Depression Father    Anxiety disorder Father    Obesity Father    Cancer Maternal Grandmother    Diabetes Maternal Grandmother    Heart disease Maternal Grandmother    Hyperlipidemia Maternal Grandmother    Hypertension Maternal Grandmother    Stroke Maternal Grandmother    Heart disease Paternal Grandmother    Hypertension Paternal Grandmother    Heart disease Paternal Grandfather    Hyperlipidemia Paternal Grandfather    Mental illness Paternal Grandfather    Rheum arthritis Sister    Multiple sclerosis Sister    Breast cancer Maternal Aunt    Thyroid cancer Paternal Aunt     ROS: Review of Systems  Constitutional: Negative for weight loss.  Cardiovascular: Negative for chest pain and claudication.  Musculoskeletal: Negative for myalgias.  Endo/Heme/Allergies:       Negative hypoglycemia    PHYSICAL EXAM: Blood pressure 105/69, pulse 84, temperature 98.1 F (36.7 C), temperature source Oral, height 5\' 5"  (1.651 m), weight 288 lb (130.6 kg), SpO2 98 %. Body mass index is 47.93 kg/m. Physical Exam Vitals signs reviewed.  Constitutional:      Appearance: Normal appearance. She is obese.  Cardiovascular:     Rate and Rhythm: Normal rate.     Pulses: Normal pulses.  Pulmonary:     Effort: Pulmonary effort is normal.     Breath sounds: Normal breath sounds.  Musculoskeletal: Normal range of motion.  Skin:    General: Skin is warm and dry.  Neurological:     Mental Status: She is alert and oriented to person, place, and time.  Psychiatric:        Mood and Affect: Mood normal.         Behavior: Behavior normal.     RECENT LABS AND TESTS: BMET  Component Value Date/Time   NA 135 04/07/2019 0606   NA 138 10/10/2018 0829   K 4.0 04/07/2019 0606   CL 102 04/07/2019 0606   CO2 23 04/07/2019 0606   GLUCOSE 109 (H) 04/07/2019 0606   BUN 14 04/07/2019 0606   BUN 12 10/10/2018 0829   CREATININE 1.00 04/07/2019 0606   CREATININE 0.95 06/27/2016 0933   CALCIUM 8.7 (L) 04/07/2019 0606   GFRNONAA >60 04/07/2019 0606   GFRAA >60 04/07/2019 0606   Lab Results  Component Value Date   HGBA1C 5.8 (H) 03/22/2019   HGBA1C 5.2 10/10/2018   HGBA1C 5.3 05/25/2018   HGBA1C 5.4 02/10/2018   HGBA1C 5.5 10/07/2017   Lab Results  Component Value Date   INSULIN 36.2 (H) 03/22/2019   INSULIN 21.4 10/10/2018   INSULIN 87.4 (H) 05/25/2018   INSULIN 20.3 02/10/2018   INSULIN 15.6 10/07/2017   CBC    Component Value Date/Time   WBC 8.3 04/07/2019 0606   RBC 4.07 04/07/2019 0606   HGB 11.7 (L) 04/07/2019 0606   HGB 11.2 06/22/2017 0904   HCT 37.1 04/07/2019 0606   HCT 34.8 06/22/2017 0904   PLT 253 04/07/2019 0606   PLT 332 11/12/2016 1651   MCV 91.2 04/07/2019 0606   MCV 86 06/22/2017 0904   MCH 28.7 04/07/2019 0606   MCHC 31.5 04/07/2019 0606   RDW 14.7 04/07/2019 0606   RDW 15.7 (H) 06/22/2017 0904   LYMPHSABS 2.0 04/07/2019 0606   LYMPHSABS 1.5 06/22/2017 0904   MONOABS 0.8 04/07/2019 0606   EOSABS 0.2 04/07/2019 0606   EOSABS 0.2 06/22/2017 0904   BASOSABS 0.0 04/07/2019 0606   BASOSABS 0.0 06/22/2017 0904   Iron/TIBC/Ferritin/ %Sat    Component Value Date/Time   IRON 53 08/31/2016 1644   TIBC 280 08/31/2016 1644   FERRITIN 36 08/31/2016 1644   IRONPCTSAT 19 08/31/2016 1644   Lipid Panel     Component Value Date/Time   CHOL 157 03/22/2019 1607   TRIG 231 (H) 03/22/2019 1607   HDL 41 03/22/2019 1607   CHOLHDL 3.5 02/10/2018 0749   CHOLHDL 4.1 12/01/2015 0914   VLDL 37 (H) 12/01/2015 0914   LDLCALC 70 03/22/2019 1607   Hepatic Function Panel      Component Value Date/Time   PROT 7.3 04/07/2019 0606   PROT 6.9 10/10/2018 0829   ALBUMIN 3.4 (L) 04/07/2019 0606   ALBUMIN 3.9 10/10/2018 0829   AST 19 04/07/2019 0606   ALT 19 04/07/2019 0606   ALKPHOS 77 04/07/2019 0606   BILITOT 0.2 (L) 04/07/2019 0606   BILITOT <0.2 10/10/2018 0829      Component Value Date/Time   TSH 1.620 08/31/2016 1644   TSH 1.83 12/01/2015 0914      OBESITY BEHAVIORAL INTERVENTION VISIT  Today's visit was # 39   Starting weight: 278 lbs Starting date: 08/31/16 Today's weight : 288 lbs Today's date: 06/21/2019 Total lbs lost to date: 0    ASK: We discussed the diagnosis of obesity with Caroline Gonzales today and Caroline Gonzales agreed to give [name] permission to discuss obesity behavioral modification therapy today.  ASSESS: Caroline Gonzales has the diagnosis of obesity and her BMI today is 12.93 Caroline Gonzales is in the action stage of change   ADVISE: Caroline Gonzales was educated on the multiple health risks of obesity as well as the benefit of weight loss to improve her health. She was advised of the need for long term treatment and the importance of lifestyle modifications to improve her  current health and to decrease her risk of future health problems.  AGREE: Multiple dietary modification options and treatment options were discussed and  Caroline Gonzales agreed to follow the recommendations documented in the above note.  ARRANGE: Caroline Gonzales was educated on the importance of frequent visits to treat obesity as outlined per CMS and USPSTF guidelines and agreed to schedule her next follow up appointment today.  I, Trixie Dredge, am acting as transcriptionist for Ilene Qua, MD  I have reviewed the above documentation for accuracy and completeness, and I agree with the above. - Ilene Qua, MD

## 2019-06-26 ENCOUNTER — Ambulatory Visit: Payer: 59 | Admitting: Physical Therapy

## 2019-06-26 ENCOUNTER — Ambulatory Visit: Payer: 59 | Admitting: Physician Assistant

## 2019-06-26 ENCOUNTER — Other Ambulatory Visit: Payer: Self-pay | Admitting: Physician Assistant

## 2019-06-28 ENCOUNTER — Ambulatory Visit: Payer: 59 | Admitting: Physical Therapy

## 2019-06-28 DIAGNOSIS — F4310 Post-traumatic stress disorder, unspecified: Secondary | ICD-10-CM | POA: Diagnosis not present

## 2019-07-04 ENCOUNTER — Other Ambulatory Visit: Payer: Self-pay

## 2019-07-04 ENCOUNTER — Encounter: Payer: Self-pay | Admitting: Physical Therapy

## 2019-07-04 ENCOUNTER — Ambulatory Visit: Payer: 59 | Admitting: Physical Therapy

## 2019-07-04 DIAGNOSIS — G8929 Other chronic pain: Secondary | ICD-10-CM

## 2019-07-04 DIAGNOSIS — M5441 Lumbago with sciatica, right side: Secondary | ICD-10-CM | POA: Diagnosis not present

## 2019-07-04 DIAGNOSIS — R262 Difficulty in walking, not elsewhere classified: Secondary | ICD-10-CM | POA: Diagnosis not present

## 2019-07-04 NOTE — Therapy (Addendum)
[place], [place], [address] Phone: [phone]   Fax:  [phone]  Physical Therapy Treatment / Discharge  Patient Details  Name: Caroline Gonzales MRN: [id number] Date of Birth: 1975-09-27 Referring Provider (PT): Leonie Green, NP   Encounter Date: 07/04/2019  PT End of Session - 07/04/19 1627    Visit Number  4    Number of Visits  13    Date for PT Re-Evaluation  07/14/19    Authorization Type  [facility]    PT Start Time  1628    PT Stop Time  1710    PT Time Calculation (min)  42 min    Activity Tolerance  Patient tolerated treatment well    Behavior During Therapy  [facility] for tasks assessed/performed       Past Medical History:  Diagnosis Date  . Allergy    Zyrtec, Allegra.  . Anxiety    followed by Dr. Toy Cookey  . Back pain   . Bipolar 1 disorder ([place])   . Complication of anesthesia   . Depression   . Fibroid   . Gallbladder problem   . Gastroparesis 09/14/2012   gastric emptying study in 2014  . GERD (gastroesophageal reflux disease)   . Headache   . Pneumonia    2013ish  . PONV (postoperative nausea and vomiting)   . Pre-diabetes   . PTSD (post-traumatic stress disorder)   . Sleep apnea    do not use a cpap  . Tinnitus   . Vitamin D deficiency   . Wears glasses     Past Surgical History:  Procedure Laterality Date  . ANKLE ARTHROSCOPY Left 2011  . CESAREAN SECTION    . CHOLECYSTECTOMY    . DILATION AND CURETTAGE OF UTERUS    . FOREIGN BODY REMOVAL Left 11/18/2016   Procedure: REMOVAL FOREIGN BODY EXTREMITY LEFT FOOT;  Surgeon: Trula Slade, DPM;  Location: [facility];  Service: Podiatry;  Laterality: Left;  . PILONIDAL CYST EXCISION  1990's  . RADIOLOGY WITH ANESTHESIA N/A 11/10/2018   Procedure: MRI WITH ANESTHESIA OF BRAIN AND ORBITS WITH AND WITHOUT CONTRAST;  Surgeon: Radiologist, Medication, MD;  Location: [facility];  Service: Radiology;  Laterality: N/A;  . TENDON REPAIR Left  2011   Left Ankle  . WISDOM TOOTH EXTRACTION  190[address]'s    There were no vitals filed for this visit.  Subjective Assessment - 07/04/19 1629    Subjective  'i am still doing pretty good, still some soreness in the hip but overall it is much better" a    Currently in Pain?  Yes    Pain Score  1     Pain Location  Hip    Pain Orientation  Right    Pain Descriptors / Indicators  Aching;Sore    Pain Type  Chronic pain    Pain Onset  More than a month ago    Pain Frequency  Intermittent         [facility] PT Assessment - 07/04/19 0001      Assessment   Medical Diagnosis  lumbar DDD    Referring Provider (PT)  Leonie Green, NP                   [facility] Adult PT Treatment/Exercise - 07/04/19 1638      Lumbar Exercises: Stretches   Piriformis Stretch  2 reps;30 seconds      Lumbar Exercises: Aerobic   Nustep  L5  x 5 min UE/LE      Knee/Hip Exercises: Standing   Hip Abduction  1 set;10 reps;Knee straight;Both;Stengthening    Abduction Limitations  with bil HHA on physioball to engage core activation    Hip Extension  2 sets;10 reps;Knee straight;Stengthening;Both   pt demonstrated increased fatigue   Extension Limitations  with bil HHA on physioball to engage core activation      Modalities   Modalities  Ultrasound      Ultrasound   Ultrasound Location  R piriformis    Ultrasound Parameters  1.0 mhz 1.5 w/cm2 x 8 min     Ultrasound Goals  Pain      Manual Therapy   Manual therapy comments  skilled palpation and monitoring of pt throughout TPDN    Soft tissue mobilization  IASTM along the piriformis on the R       Trigger Point Dry Needling - 07/04/19 0001    Consent Given?  Yes    Education Handout Provided  Previously provided    Muscles Treated Back/Hip  Piriformis    Piriformis Response  Twitch response elicited;Palpable increased muscle length                PT Long Term Goals - 06/01/19 1400      PT LONG TERM GOAL #1   Title  pt will be able  to fold laundry without taking a break    Baseline  severe pain while standing at eval    Time  6    Period  Weeks    Status  New    Target Date  07/14/19      PT LONG TERM GOAL #2   Title  will tolerate ambulation from hospital to car without limitation by back pain    Baseline  severe pain at eval    Time  6    Period  Weeks    Status  New    Target Date  07/14/19      PT LONG TERM GOAL #3   Title  centralization of peripheralized pain    Baseline  down to medial maleolus at eval    Time  6    Period  Weeks    Status  New    Target Date  07/14/19      PT LONG TERM GOAL #4   Title  proper hip hinge to support spine while washing dishes    Baseline  guarded with hip hinge and pain at eval    Time  6    Period  Weeks    Status  New    Target Date  07/14/19            Plan - 07/04/19 1717    Clinical Impression Statement  pt continues to report limited pain and improvement in overall function. She does note R posterior hip stightness with associated referred soreness down the back of the leg. Continued TPDN focusing on the R piriformis followed with IASTM techniques and [name] to promote blood flow. worked on standing hip strengthening which she does fatigue quickly with especially hip extension.    PT Next Visit Plan  gentle core/ hip strengthening in standing, step up/down, response to DN, focus on flexion based exercises.    Consulted and Agree with Plan of Care  Patient       Patient will benefit from skilled therapeutic intervention in order to improve the following deficits and impairments:  Abnormal gait, Difficulty walking, Increased muscle  spasms, Pain, Decreased activity tolerance, Decreased endurance, Hypomobility, Impaired flexibility, Improper body mechanics, Decreased strength, Impaired sensation, Postural dysfunction  Visit Diagnosis: Chronic bilateral low back pain with right-sided sciatica  Difficulty in walking, not elsewhere classified     Problem  List Patient Active Problem List   Diagnosis Date Noted  . PTSD (post-traumatic stress disorder) 07/21/2018  . GAD (generalized anxiety disorder) 07/21/2018  . Insomnia 07/21/2018  . MDD (major depressive disorder) 07/21/2018  . Nightmares 07/21/2018  . Insulin resistance 10/21/2017  . Other hyperlipidemia 10/07/2017  . Vitamin D deficiency 10/07/2017  . Bipolar depression ([place]) 04/27/2017  . Class 3 obesity with serious comorbidity and body mass index (BMI) of 40.0 to 44.9 in adult 04/27/2017  . Residual foreign body in soft tissue 11/12/2016  . Prediabetes 11/02/2016  . Morbid obesity ([place]) 10/15/2016  . Bipolar 1 disorder ([place])    Starr Lake PT, DPT, LAT, ATC  07/04/19  5:19 PM      [facility] [address] [place], [place], [address] Phone: [phone]   Fax:  [phone]  Name: Caroline Gonzales MRN: [id number] Date of Birth: 04-Nov-1975     PHYSICAL THERAPY DISCHARGE SUMMARY  Visits from Start of Care: 4  Current functional level related to goals / functional outcomes: See goals   Remaining deficits: Unknown due to pt not returning   Education / Equipment: HEP, theraband, posture  Plan: Patient agrees to discharge.  Patient goals were not met. Patient is being discharged due to not returning since the last visit.  ?????         Kristoffer Leamon PT, DPT, LAT, ATC  08/23/19  9:45 AM

## 2019-07-05 DIAGNOSIS — F4310 Post-traumatic stress disorder, unspecified: Secondary | ICD-10-CM | POA: Diagnosis not present

## 2019-07-06 ENCOUNTER — Ambulatory Visit: Payer: 59 | Admitting: Physical Therapy

## 2019-07-06 ENCOUNTER — Encounter (INDEPENDENT_AMBULATORY_CARE_PROVIDER_SITE_OTHER): Payer: Self-pay | Admitting: Bariatrics

## 2019-07-06 ENCOUNTER — Ambulatory Visit (INDEPENDENT_AMBULATORY_CARE_PROVIDER_SITE_OTHER): Payer: 59 | Admitting: Bariatrics

## 2019-07-06 ENCOUNTER — Other Ambulatory Visit: Payer: Self-pay

## 2019-07-06 VITALS — BP 109/73 | HR 83 | Temp 97.9°F | Ht 65.0 in | Wt 288.0 lb

## 2019-07-06 DIAGNOSIS — E559 Vitamin D deficiency, unspecified: Secondary | ICD-10-CM

## 2019-07-06 DIAGNOSIS — R7303 Prediabetes: Secondary | ICD-10-CM

## 2019-07-06 DIAGNOSIS — Z6841 Body Mass Index (BMI) 40.0 and over, adult: Secondary | ICD-10-CM

## 2019-07-06 NOTE — Progress Notes (Signed)
Office: [phone]  /  Fax: [phone]   HPI:   Chief Complaint: OBESITY Caroline Gonzales is here to discuss her progress with her obesity treatment plan. She is on the Category 3 plan and is following her eating plan approximately 20% of the time. She states she is walking 20 minutes 5 times per week and doing PT 30 minutes 2 times per week. Caroline Gonzales is a patient of Dr. Adair Patter. She states that she is digging into "some food issues." She reports having episodes of binging (is seeing a therapist). Her weight is 288 lb (130.6 kg) today and has not lost weight since her last visit. She has lost 0 lbs since starting treatment with [name].  Pre-Diabetes Caroline Gonzales has a diagnosis of prediabetes based on her elevated Hgb A1c and was informed this puts her at greater risk of developing diabetes. Last A1c 5.8 on 03/22/2019 with an insulin of 36.2. She is taking metformin currently and continues to work on diet and exercise to decrease risk of diabetes. She denies nausea or hypoglycemia. No polyphagia.  Vitamin D deficiency Caroline Gonzales has a diagnosis of Vitamin D deficiency. She is currently taking OTC Vit D and denies nausea, vomiting or muscle weakness.  ASSESSMENT AND PLAN:  Prediabetes  Vitamin D deficiency  Class 3 severe obesity with serious comorbidity and body mass index (BMI) of 45.0 to 49.9 in adult, unspecified obesity type [facility])  PLAN:  Pre-Diabetes Caroline Gonzales will continue to work on weight loss, exercise, and decreasing simple carbohydrates in her diet to help decrease the risk of diabetes. We dicussed metformin including benefits and risks. She was informed that eating too many simple carbohydrates or too many calories at one sitting increases the likelihood of GI side effects. Luara will continue her medication and follow-up as directed to monitor her progress.  Vitamin D Deficiency Caroline Gonzales was informed that low Vitamin D levels contributes to fatigue and are associated with obesity, breast,  and colon cancer. She agrees to continue taking Vit D and will follow-up for routine testing of Vitamin D, at least 2-3 times per year. She was informed of the risk of over-replacement of Vitamin D and agrees to not increase her dose unless she discusses this with [name] first. Caroline Gonzales agrees to follow-up with our clinic in 2-3 weeks.  I spent > than 50% of the 15 minute visit on counseling as documented in the note.  Obesity Caroline Gonzales is currently in the action stage of change. As such, her goal is to continue with weight loss efforts. She has agreed to follow the Category 3 plan. Caroline Gonzales will work on meal planning, intentional eating, and will start eating breakfast. Caroline Gonzales has been instructed to continue following up with PT for her back pain. We discussed the following Behavioral Modification Strategies today: increasing lean protein intake, decreasing simple carbohydrates, increasing vegetables, increase H20 intake, decrease eating out, no skipping meals, work on meal planning and easy cooking plans, keeping healthy foods in the home, and planning for success.  Caroline Gonzales has agreed to follow up with our clinic in 2-3 weeks. She was informed of the importance of frequent follow up visits to maximize her success with intensive lifestyle modifications for her multiple health conditions.  ALLERGIES: Allergies  Allergen Reactions   Penicillins Shortness Of Breath and Rash    Did it involve swelling of the face/tongue/throat, SOB, or low BP? Yes Did it involve sudden or severe rash/hives, skin peeling, or any reaction on the inside of your mouth or nose? Yes Did  you need to seek medical attention at a hospital or doctor's office? Yes When did it last happen?childhood allergy If all above answers are NO, may proceed with cephalosporin use.     Tamiflu [Oseltamivir Phosphate] Anaphylaxis   Tamiflu [Oseltamivir Phosphate] Anaphylaxis   Cephalosporins Rash   Latex Rash     MEDICATIONS: Current Outpatient Medications on File Prior to Visit  Medication Sig Dispense Refill   BIOTIN PO Take by mouth.     cetirizine (ZYRTEC) 10 MG tablet Take 10 mg by mouth daily.     Cholecalciferol (VITAMIN D3) 5000 units CAPS Take 5,000 Units by mouth at bedtime.     clonazePAM (KLONOPIN) 0.5 MG tablet TAKE 1 TABLET BY MOUTH EVERY 6 HOURS AS NEEDED FOR ANXIETY 120 tablet 1   gabapentin (NEURONTIN) 300 MG capsule Take 1 capsule (300 mg total) by mouth at bedtime. (Patient taking differently: Take 300 mg by mouth 3 (three) times daily. ) 90 capsule 0   ibuprofen (ADVIL,MOTRIN) 200 MG tablet Take 400 mg by mouth daily as needed for headache or moderate pain.     L-Methylfolate-Algae (DEPLIN 15) 15-90.314 MG CAPS Take 1 capsule by mouth at bedtime.      lamoTRIgine (LAMICTAL) 150 MG tablet TAKE 1 TABLET BY MOUTH TWICE DAILY 180 tablet 0   levonorgestrel (MIRENA) 20 MCG/24HR IUD 1 each by Intrauterine route once.      Multiple Vitamin (MULTIVITAMIN) tablet Take 1 tablet by mouth at bedtime.      prazosin (MINIPRESS) 1 MG capsule TAKE 1 CAPSULE BY MOUTH AT BEDTIME. ADD TO THE 4 MG YOU'RE ALREADY TAKING TO =5 MG. 90 capsule 0   prazosin (MINIPRESS) 2 MG capsule TAKE 2 CAPSULES BY MOUTH AT BEDTIME. 180 capsule 0   propranolol (INDERAL) 40 MG tablet TAKE 1 TABLET BY MOUTH 2 TIMES DAILY. 180 tablet 0   QUEtiapine (SEROQUEL) 200 MG tablet TAKE ONE AND A HALF (1.5) TABLETS (300 MG TOTAL) BY MOUTH AT BEDTIME. 135 tablet 0   fexofenadine (ALLEGRA) 180 MG tablet Take 180 mg by mouth at bedtime.     meclizine (ANTIVERT) 25 MG tablet Take 1 tablet (25 mg total) by mouth 3 (three) times daily as needed for dizziness. 60 tablet 1   metFORMIN (GLUCOPHAGE) 1000 MG tablet Take 1 tablet (1,000 mg total) by mouth daily with breakfast. 90 tablet 0   pantoprazole (PROTONIX) 40 MG tablet Take 40 mg by mouth at bedtime.      No current facility-administered medications on file prior to  visit.     PAST MEDICAL HISTORY: Past Medical History:  Diagnosis Date   Allergy    Zyrtec, Allegra.   Anxiety    followed by Dr. Toy Cookey   Back pain    Bipolar 1 disorder ([place])    Complication of anesthesia    Depression    Fibroid    Gallbladder problem    Gastroparesis 09/14/2012   gastric emptying study in 2014   GERD (gastroesophageal reflux disease)    Headache    Pneumonia    2013ish   PONV (postoperative nausea and vomiting)    Pre-diabetes    PTSD (post-traumatic stress disorder)    Sleep apnea    do not use a cpap   Tinnitus    Vitamin D deficiency    Wears glasses     PAST SURGICAL HISTORY: Past Surgical History:  Procedure Laterality Date   ANKLE ARTHROSCOPY Left 2011   CESAREAN SECTION     CHOLECYSTECTOMY  DILATION AND CURETTAGE OF UTERUS     FOREIGN BODY REMOVAL Left 11/18/2016   Procedure: REMOVAL FOREIGN BODY EXTREMITY LEFT FOOT;  Surgeon: Trula Slade, DPM;  Location: [place];  Service: Podiatry;  Laterality: Left;   PILONIDAL CYST EXCISION  1990's   RADIOLOGY WITH ANESTHESIA N/A 11/10/2018   Procedure: MRI WITH ANESTHESIA OF BRAIN AND ORBITS WITH AND WITHOUT CONTRAST;  Surgeon: Radiologist, Medication, MD;  Location: [place];  Service: Radiology;  Laterality: N/A;   TENDON REPAIR Left 2011   Left Ankle   WISDOM TOOTH EXTRACTION  19090's    SOCIAL HISTORY: Social History   Tobacco Use   Smoking status: Former Smoker    Years: 10.00    Types: Cigarettes    Quit date: 2003    Years since quitting: 17.8   Smokeless tobacco: Never Used  Substance Use Topics   Alcohol use: Yes    Alcohol/week: 0.0 standard drinks    Comment: rarely   Drug use: No    FAMILY HISTORY: Family History  Problem Relation Age of Onset   Cancer Mother        squamous cell carcinoma   Hyperlipidemia Mother    Depression Mother    Anxiety disorder Mother    Obesity Mother    Heart disease Father 72       cardiomegaly,  CHF; steroid use.   Hyperlipidemia Father    Hypertension Father    Mental retardation Father    High blood pressure Father    Depression Father    Anxiety disorder Father    Obesity Father    Cancer Maternal Grandmother    Diabetes Maternal Grandmother    Heart disease Maternal Grandmother    Hyperlipidemia Maternal Grandmother    Hypertension Maternal Grandmother    Stroke Maternal Grandmother    Heart disease Paternal Grandmother    Hypertension Paternal Grandmother    Heart disease Paternal Grandfather    Hyperlipidemia Paternal Grandfather    Mental illness Paternal Grandfather    Rheum arthritis Sister    Multiple sclerosis Sister    Breast cancer Maternal Aunt    Thyroid cancer Paternal Aunt    ROS: Review of Systems  Gastrointestinal: Negative for nausea and vomiting.  Musculoskeletal:       Negative for muscle weakness.  Endo/Heme/Allergies:       Negative for hypoglycemia. Negative for polyphagia.   PHYSICAL EXAM: Blood pressure 109/73, pulse 83, temperature 97.9 F (36.6 C), temperature source Oral, height 5\' 5"  (1.651 m), weight 288 lb (130.6 kg), SpO2 97 %. Body mass index is 47.93 kg/m. Physical Exam Vitals signs reviewed.  Constitutional:      Appearance: Normal appearance. She is obese.  Cardiovascular:     Rate and Rhythm: Normal rate.     Pulses: Normal pulses.  Pulmonary:     Effort: Pulmonary effort is normal.     Breath sounds: Normal breath sounds.  Musculoskeletal: Normal range of motion.  Skin:    General: Skin is warm and dry.  Neurological:     Mental Status: She is alert and oriented to person, place, and time.  Psychiatric:        Behavior: Behavior normal.   RECENT LABS AND TESTS: BMET    Component Value Date/Time   NA 135 04/07/2019 0606   NA 138 10/10/2018 0829   K 4.0 04/07/2019 0606   CL 102 04/07/2019 0606   CO2 23 04/07/2019 0606   GLUCOSE 109 (H) 04/07/2019 0606  BUN 14 04/07/2019 0606   BUN  12 10/10/2018 0829   CREATININE 1.00 04/07/2019 0606   CREATININE 0.95 06/27/2016 0933   CALCIUM 8.7 (L) 04/07/2019 0606   GFRNONAA >60 04/07/2019 0606   GFRAA >60 04/07/2019 0606   Lab Results  Component Value Date   HGBA1C 5.8 (H) 03/22/2019   HGBA1C 5.2 10/10/2018   HGBA1C 5.3 05/25/2018   HGBA1C 5.4 02/10/2018   HGBA1C 5.5 10/07/2017   Lab Results  Component Value Date   INSULIN 36.2 (H) 03/22/2019   INSULIN 21.4 10/10/2018   INSULIN 87.4 (H) 05/25/2018   INSULIN 20.3 02/10/2018   INSULIN 15.6 10/07/2017   CBC    Component Value Date/Time   WBC 8.3 04/07/2019 0606   RBC 4.07 04/07/2019 0606   HGB 11.7 (L) 04/07/2019 0606   HGB 11.2 06/22/2017 0904   HCT 37.1 04/07/2019 0606   HCT 34.8 06/22/2017 0904   PLT 253 04/07/2019 0606   PLT 332 11/12/2016 1651   MCV 91.2 04/07/2019 0606   MCV 86 06/22/2017 0904   MCH 28.7 04/07/2019 0606   MCHC 31.5 04/07/2019 0606   RDW 14.7 04/07/2019 0606   RDW 15.7 (H) 06/22/2017 0904   LYMPHSABS 2.0 04/07/2019 0606   LYMPHSABS 1.5 06/22/2017 0904   MONOABS 0.8 04/07/2019 0606   EOSABS 0.2 04/07/2019 0606   EOSABS 0.2 06/22/2017 0904   BASOSABS 0.0 04/07/2019 0606   BASOSABS 0.0 06/22/2017 0904   Iron/TIBC/Ferritin/ %Sat    Component Value Date/Time   IRON 53 08/31/2016 1644   TIBC 280 08/31/2016 1644   FERRITIN 36 08/31/2016 1644   IRONPCTSAT 19 08/31/2016 1644   Lipid Panel     Component Value Date/Time   CHOL 157 03/22/2019 1607   TRIG 231 (H) 03/22/2019 1607   HDL 41 03/22/2019 1607   CHOLHDL 3.5 02/10/2018 0749   CHOLHDL 4.1 12/01/2015 0914   VLDL 37 (H) 12/01/2015 0914   LDLCALC 70 03/22/2019 1607   Hepatic Function Panel     Component Value Date/Time   PROT 7.3 04/07/2019 0606   PROT 6.9 10/10/2018 0829   ALBUMIN 3.4 (L) 04/07/2019 0606   ALBUMIN 3.9 10/10/2018 0829   AST 19 04/07/2019 0606   ALT 19 04/07/2019 0606   ALKPHOS 77 04/07/2019 0606   BILITOT 0.2 (L) 04/07/2019 0606   BILITOT <0.2  10/10/2018 0829      Component Value Date/Time   TSH 1.620 08/31/2016 1644   TSH 1.83 12/01/2015 0914   Results for MONTEZ, KOETJE (MRN [id number]) as of 07/06/2019 12:36  Ref. Range 10/10/2018 08:29  Vitamin D, 25-Hydroxy Latest Ref Range: 30.0 - 100.0 ng/mL 55.5   OBESITY BEHAVIORAL INTERVENTION VISIT  Today's visit was #58  Starting weight: 278 lbs Starting date: 08/31/2016 Today's weight: 288 lbs  Today's date: 07/06/2019 Total lbs lost to date: 0    07/06/2019  Height 5\' 5"  (1.651 m)  Weight 288 lb (130.6 kg)  BMI (Calculated) 47.93  BLOOD PRESSURE - SYSTOLIC 0000000  BLOOD PRESSURE - DIASTOLIC 73   Body Fat % 123456 %  Total Body Water (lbs) 97.2 lbs   ASK: We discussed the diagnosis of obesity with Marjie Skiff Gasner today and Marylee agreed to give [name] permission to discuss obesity behavioral modification therapy today.  ASSESS: Laetitia has the diagnosis of obesity and her BMI today is 48.0. Thersa is in the action stage of change.   ADVISE: Luceal was educated on the multiple health risks of obesity as well  as the benefit of weight loss to improve her health. She was advised of the need for long term treatment and the importance of lifestyle modifications to improve her current health and to decrease her risk of future health problems.  AGREE: Multiple dietary modification options and treatment options were discussed and  Deaisha agreed to follow the recommendations documented in the above note.  ARRANGE: Cannie was educated on the importance of frequent visits to treat obesity as outlined per CMS and USPSTF guidelines and agreed to schedule her next follow up appointment today.  Migdalia Dk, am acting as Location manager for [employer], DO  I have reviewed the above documentation for accuracy and completeness, and I agree with the above. -Jearld Lesch, DO

## 2019-07-11 ENCOUNTER — Ambulatory Visit: Payer: 59 | Admitting: Physical Therapy

## 2019-07-11 MED FILL — lamoTRIgine 150 MG TABS: 150 | 90 days supply | Qty: 180 | Fill #0

## 2019-07-11 MED FILL — GABAPENTIN 300 MG CAPSULE: 300 | 30 days supply | Qty: 90 | Fill #1

## 2019-07-12 ENCOUNTER — Other Ambulatory Visit: Payer: Self-pay

## 2019-07-12 DIAGNOSIS — F4310 Post-traumatic stress disorder, unspecified: Secondary | ICD-10-CM | POA: Diagnosis not present

## 2019-07-12 MED ORDER — DEPLIN 15 15-90.314 MG PO CAPS: 1.0000 | ORAL_CAPSULE | Freq: Every day | ORAL | 3 refills | Status: DC

## 2019-07-13 ENCOUNTER — Ambulatory Visit: Payer: 59 | Admitting: Physical Therapy

## 2019-07-17 MED FILL — clonazePAM 0.5 MG TABS: 0.5 | 30 days supply | Qty: 120 | Fill #1

## 2019-07-19 DIAGNOSIS — F4310 Post-traumatic stress disorder, unspecified: Secondary | ICD-10-CM | POA: Diagnosis not present

## 2019-07-24 ENCOUNTER — Other Ambulatory Visit: Payer: Self-pay

## 2019-07-24 ENCOUNTER — Encounter: Payer: Self-pay | Admitting: Physician Assistant

## 2019-07-24 ENCOUNTER — Ambulatory Visit (INDEPENDENT_AMBULATORY_CARE_PROVIDER_SITE_OTHER): Payer: 59 | Admitting: Physician Assistant

## 2019-07-24 DIAGNOSIS — G4733 Obstructive sleep apnea (adult) (pediatric): Secondary | ICD-10-CM | POA: Diagnosis not present

## 2019-07-24 DIAGNOSIS — G4700 Insomnia, unspecified: Secondary | ICD-10-CM

## 2019-07-24 DIAGNOSIS — F4310 Post-traumatic stress disorder, unspecified: Secondary | ICD-10-CM | POA: Diagnosis not present

## 2019-07-24 DIAGNOSIS — F411 Generalized anxiety disorder: Secondary | ICD-10-CM

## 2019-07-24 DIAGNOSIS — F331 Major depressive disorder, recurrent, moderate: Secondary | ICD-10-CM | POA: Diagnosis not present

## 2019-07-24 NOTE — Progress Notes (Signed)
[facility]  Patient ID: Caroline Gonzales,  MRN: [id number]  PCP: Ronnald Nian, DO  Date of Evaluation: 07/24/2019 Time spent:15 minutes  Chief Complaint:  Chief Complaint    Anxiety; Depression; Insomnia; Follow-up      HISTORY/CURRENT STATUS: HPI For routine f/u.  It's been a rough few months.  A friend committed suicide, another friend died of a congenital heart defect, and they found out that her sister is much worse than they thought with the MS. She only has maybe 6 months or 2 years to live. Very sad.   When she wakes up just about everyday, it'll take her a few minutes to realize where she is. No night terrors.  She is doing some indepth work in therapy and thinks that may be a part of it, according to her counselor.  She does have anxiety still but the Klonopin is effective.  She is working full-time as well as going to school working on her masters.  She has had to use more FMLA recently for reasons noted above.  She does enjoy some things but she is tired a lot because of work and school.  Motivation is usually fair to good.  No suicidal or homicidal thoughts.  She continues to have problems with her back.  Is seeing a specialist for that and had dry needling done.  It did seem to help some.  They increased the gabapentin to 600 mg in the evening which has been very helpful.  Patient is unsure if they will continue that after the back pain settles down.  "It has helped me so much to relax and sleep well at night, I do not want to decrease the dose again."  Denies dizziness, syncope, seizures, numbness, tingling, tremor, tics, unsteady gait, slurred speech, confusion. Denies muscle or joint pain, stiffness, or dystonia.  Individual Medical History/ Review of Systems: Changes? :Yes See HPI  Allergies: Penicillins, Tamiflu [oseltamivir phosphate], Tamiflu [oseltamivir phosphate], Cephalosporins, and Latex  Current Medications:  Current Outpatient  Medications:  .  BIOTIN PO, Take by mouth., Disp: , Rfl:  .  cetirizine (ZYRTEC) 10 MG tablet, Take 10 mg by mouth daily., Disp: , Rfl:  .  Cholecalciferol (VITAMIN D3) 5000 units CAPS, Take 5,000 Units by mouth at bedtime., Disp: , Rfl:  .  clonazePAM (KLONOPIN) 0.5 MG tablet, TAKE 1 TABLET BY MOUTH EVERY 6 HOURS AS NEEDED FOR ANXIETY, Disp: 120 tablet, Rfl: 1 .  gabapentin (NEURONTIN) 300 MG capsule, Take 1 capsule (300 mg total) by mouth at bedtime. (Patient taking differently: Take 300 mg by mouth 3 (three) times daily. 1 q am prn, 2 at hs.), Disp: 90 capsule, Rfl: 0 .  ibuprofen (ADVIL,MOTRIN) 200 MG tablet, Take 400 mg by mouth daily as needed for headache or moderate pain., Disp: , Rfl:  .  L-Methylfolate-Algae (DEPLIN 15) 15-90.314 MG CAPS, Take 1 capsule by mouth daily., Disp: 90 capsule, Rfl: 3 .  lamoTRIgine (LAMICTAL) 150 MG tablet, TAKE 1 TABLET BY MOUTH TWICE DAILY, Disp: 180 tablet, Rfl: 0 .  levonorgestrel (MIRENA) 20 MCG/24HR IUD, 1 each by Intrauterine route once. , Disp: , Rfl:  .  metFORMIN (GLUCOPHAGE) 1000 MG tablet, Take 1 tablet (1,000 mg total) by mouth daily with breakfast., Disp: 90 tablet, Rfl: 0 .  Multiple Vitamin (MULTIVITAMIN) tablet, Take 1 tablet by mouth at bedtime. , Disp: , Rfl:  .  prazosin (MINIPRESS) 1 MG capsule, TAKE 1 CAPSULE BY MOUTH AT BEDTIME. ADD TO THE 4  MG YOU'RE ALREADY TAKING TO =5 MG., Disp: 90 capsule, Rfl: 0 .  prazosin (MINIPRESS) 2 MG capsule, TAKE 2 CAPSULES BY MOUTH AT BEDTIME., Disp: 180 capsule, Rfl: 0 .  propranolol (INDERAL) 40 MG tablet, TAKE 1 TABLET BY MOUTH 2 TIMES DAILY., Disp: 180 tablet, Rfl: 0 .  QUEtiapine (SEROQUEL) 200 MG tablet, TAKE ONE AND A HALF (1.5) TABLETS (300 MG TOTAL) BY MOUTH AT BEDTIME., Disp: 135 tablet, Rfl: 0 .  fexofenadine (ALLEGRA) 180 MG tablet, Take 180 mg by mouth at bedtime., Disp: , Rfl:  .  meclizine (ANTIVERT) 25 MG tablet, Take 1 tablet (25 mg total) by mouth 3 (three) times daily as needed for  dizziness., Disp: 60 tablet, Rfl: 1 .  pantoprazole (PROTONIX) 40 MG tablet, Take 40 mg by mouth at bedtime. , Disp: , Rfl:  Medication Side Effects: none  Family Medical/ Social History: Changes? 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   [place]:  There were no vitals taken for this visit.There is no height or weight on file to calculate BMI.  General Appearance: Casual, Neat, Well Groomed and Obese  Eye Contact:  Good  Speech:  Clear and Coherent  Volume:  Normal  Mood:  Sad  Affect:  Appropriate  Thought Process:  Goal Directed and Descriptions of Associations: Intact  Orientation:  Full (Time, Place, and Person)  Thought Content: Logical   Suicidal Thoughts:  No  Homicidal Thoughts:  No  Memory:  WNL  Judgement:  Good  Insight:  Good  Psychomotor Activity:  Normal  Concentration:  Concentration: Good  Recall:  Good  Fund of Knowledge: Good  Language: Good  Assets:  Desire for Improvement  ADL's:  Intact  Cognition: WNL  Prognosis:  Good    DIAGNOSES:    ICD-10-CM   1. PTSD (post-traumatic stress disorder)  F43.10   2. Major depressive disorder, recurrent episode, moderate (HCC)  F33.1   3. Generalized anxiety disorder  F41.1   4. Insomnia, unspecified type  G47.00   5. Obstructive sleep apnea  G47.33     Receiving Psychotherapy: Yes Heather Mask   RECOMMENDATIONS:  PDMP was reviewed. Continue Klonopin 0.5 mg p.o. 4 times daily as needed. Continue gabapentin 300 mg every morning and 600 mg nightly.  If needed, I will prescribe that for her. Continue Lamictal 150 mg 1 twice daily as needed. Continue Deplin 15 mg daily. Continue prazosin total of 5 mg nightly. Continue propranolol 40 mg 1 twice daily. Continue Seroquel 200 mg, 1.5 pills nightly. Continue therapy with Heather Mask. Return in 2 months.  Donnal Moat, PA-C

## 2019-07-26 DIAGNOSIS — F4310 Post-traumatic stress disorder, unspecified: Secondary | ICD-10-CM | POA: Diagnosis not present

## 2019-07-31 ENCOUNTER — Ambulatory Visit (INDEPENDENT_AMBULATORY_CARE_PROVIDER_SITE_OTHER): Payer: 59 | Admitting: Bariatrics

## 2019-07-31 ENCOUNTER — Other Ambulatory Visit: Payer: Self-pay

## 2019-07-31 ENCOUNTER — Encounter (INDEPENDENT_AMBULATORY_CARE_PROVIDER_SITE_OTHER): Payer: Self-pay | Admitting: Bariatrics

## 2019-07-31 VITALS — BP 120/82 | HR 92 | Temp 98.3°F | Ht 65.0 in | Wt 286.0 lb

## 2019-07-31 DIAGNOSIS — F3289 Other specified depressive episodes: Secondary | ICD-10-CM

## 2019-07-31 DIAGNOSIS — E8881 Metabolic syndrome: Secondary | ICD-10-CM

## 2019-07-31 DIAGNOSIS — Z6841 Body Mass Index (BMI) 40.0 and over, adult: Secondary | ICD-10-CM

## 2019-07-31 DIAGNOSIS — E559 Vitamin D deficiency, unspecified: Secondary | ICD-10-CM

## 2019-07-31 DIAGNOSIS — Z9189 Other specified personal risk factors, not elsewhere classified: Secondary | ICD-10-CM

## 2019-07-31 MED ORDER — TROKENDI XR 50 MG PO CP24: 50.0000 mg | ORAL_CAPSULE | Freq: Every day | ORAL | 0 refills | Status: DC

## 2019-07-31 MED FILL — TROKENDI XR 50 MG CAPSULE: 50 | 30 days supply | Qty: 30 | Fill #0

## 2019-08-01 NOTE — Progress Notes (Signed)
Office: [phone]  /  Fax: [phone]   HPI:   Chief Complaint: OBESITY Caroline Gonzales is here to discuss her progress with her obesity treatment plan. She is on the Category 3 plan and is following her eating plan approximately 10% of the time. She states she is walking 20 minutes 5 times per week. Caroline Gonzales is down 2 lbs but has struggled overall. Her schedule is overwhelming. Her weight is 286 lb (129.7 kg) today and has had a weight loss of 2 pounds over a period of 4 weeks since her last visit. She has lost 0 lbs since starting treatment with [name].  Insulin Resistance Caroline Gonzales has a diagnosis of insulin resistance based on her elevated fasting insulin level >5. Last A1c 5.8 on 03/22/2019. Although Caroline Gonzales's blood glucose readings are still under good control, insulin resistance puts her at greater risk of metabolic syndrome and diabetes. She is taking metformin currently and continues to work on diet and exercise to decrease risk of diabetes.  At risk for diabetes Caroline Gonzales is at higher than average risk for developing diabetes due to her obesity. She currently denies polyuria or polydipsia.  Vitamin D deficiency Caroline Gonzales has a diagnosis of Vitamin D deficiency. She is currently taking Vit D and denies nausea, vomiting or muscle weakness.  Depression with emotional eating behaviors Caroline Gonzales is struggling with emotional eating and using food for comfort to the extent that it is negatively impacting her health. She often snacks when she is not hungry. Caroline Gonzales sometimes feels she is out of control and then feels guilty that she made poor food choices. She has been working on behavior modification techniques to help reduce her emotional eating and has been somewhat successful. She reports positive stress, emotional. She shows no sign of suicidal or homicidal ideations.  Depression screen Caroline Gonzales 2/9 03/29/2018 02/02/2018 04/14/2017 11/12/2016 08/31/2016  Decreased Interest 0 0 0 0 1  Down, Depressed,  Hopeless 1 0 0 0 3  PHQ - 2 Score 1 0 0 0 4  Altered sleeping 3 - - - 1  Tired, decreased energy 1 - - - 3  Change in appetite 3 - - - 3  Feeling bad or failure about yourself  2 - - - 3  Trouble concentrating 0 - - - 0  Moving slowly or fidgety/restless 0 - - - 2  Suicidal thoughts 0 - - - 1  PHQ-9 Score 10 - - - 17   ASSESSMENT AND PLAN:  Insulin resistance  Vitamin D deficiency  Other depression - With emotional eating  - Plan: Topiramate ER (TROKENDI XR) 50 MG CP24  At risk for diabetes mellitus  Class 3 severe obesity with serious comorbidity and body mass index (BMI) of 45.0 to 49.9 in adult, unspecified obesity type (HCC)  PLAN:  Insulin Resistance Caroline Gonzales will continue to work on weight loss, exercise, and decreasing simple carbohydrates in her diet to help decrease the risk of diabetes. Caroline Gonzales dicussed metformin including benefits and risks. She was informed that eating too many simple carbohydrates or too many calories at one sitting increases the likelihood of GI side effects. Caroline Gonzales will continue metformin and follow-up as directed.  Diabetes risk counseling Caroline Gonzales was given extended (15 minutes) diabetes prevention counseling today. She is 43 y.o. female and has risk factors for diabetes including obesity. Caroline Gonzales discussed intensive lifestyle modifications today with an emphasis on weight loss as well as increasing exercise and decreasing simple carbohydrates in her diet.  Vitamin D Deficiency Caroline Gonzales was informed that  low Vitamin D levels contributes to fatigue and are associated with obesity, breast, and colon cancer. She agrees to continue taking Vit D and will follow-up for routine testing of Vitamin D, at least 2-3 times per year. She was informed of the risk of over-replacement of Vitamin D and agrees to not increase her dose unless she discusses this with [name] first. Caroline Gonzales agrees to follow-up with our clinic in 2-3 weeks.  Depression with Emotional Eating Behaviors Caroline Gonzales  discussed behavior modification techniques today to help Caroline Gonzales deal with her emotional eating and depression. Caroline Gonzales was given a prescription for Trokendi 50 mg 1 PO daily #30 with 0 refills. She agrees to follow-up with our clinic in 2-3 weeks.  Obesity Caroline Gonzales is currently in the action stage of change. As such, her goal is to continue with weight loss efforts. She has agreed to follow the Category 3 plan. Caroline Gonzales will work on meal planning and ways to make easier meals. She will increase her H20 intake. Caroline Gonzales has been instructed to work up to a goal of 150 minutes of combined cardio and strengthening exercise per week for weight loss and overall health benefits. Caroline Gonzales discussed the following Behavioral Modification Stratagies today: increasing lean protein intake, decreasing simple carbohydrates, increasing vegetables, increase H20 intake, decrease eating out, no skipping meals, work on meal planning and easy cooking plans, keeping healthy foods in the home, and planning for success.  Caroline Gonzales has agreed to follow-up with our clinic in 2-3 weeks. She was informed of the importance of frequent follow-up visits to maximize her success with intensive lifestyle modifications for her multiple health conditions.  ALLERGIES: Allergies  Allergen Reactions   Penicillins Shortness Of Breath and Rash    Did it involve swelling of the face/tongue/throat, SOB, or low BP? Yes Did it involve sudden or severe rash/hives, skin peeling, or any reaction on the inside of your mouth or nose? Yes Did you need to seek medical attention at a hospital or doctor's office? Yes When did it last happen?childhood allergy If all above answers are NO, may proceed with cephalosporin use.     Tamiflu [Oseltamivir Phosphate] Anaphylaxis   Tamiflu [Oseltamivir Phosphate] Anaphylaxis   Cephalosporins Rash   Latex Rash    MEDICATIONS: Current Outpatient Medications on File Prior to Visit  Medication Sig  Dispense Refill   BIOTIN PO Take by mouth.     cetirizine (ZYRTEC) 10 MG tablet Take 10 mg by mouth daily.     Cholecalciferol (VITAMIN D3) 5000 units CAPS Take 5,000 Units by mouth at bedtime.     clonazePAM (KLONOPIN) 0.5 MG tablet TAKE 1 TABLET BY MOUTH EVERY 6 HOURS AS NEEDED FOR ANXIETY 120 tablet 1   fexofenadine (ALLEGRA) 180 MG tablet Take 180 mg by mouth at bedtime.     gabapentin (NEURONTIN) 300 MG capsule Take 1 capsule (300 mg total) by mouth at bedtime. (Patient taking differently: Take 300 mg by mouth 3 (three) times daily. 1 q am prn, 2 at hs.) 90 capsule 0   ibuprofen (ADVIL,MOTRIN) 200 MG tablet Take 400 mg by mouth daily as needed for headache or moderate pain.     L-Methylfolate-Algae (DEPLIN 15) 15-90.314 MG CAPS Take 1 capsule by mouth daily. 90 capsule 3   lamoTRIgine (LAMICTAL) 150 MG tablet TAKE 1 TABLET BY MOUTH TWICE DAILY 180 tablet 0   levonorgestrel (MIRENA) 20 MCG/24HR IUD 1 each by Intrauterine route once.      meclizine (ANTIVERT) 25 MG tablet Take 1 tablet (25  mg total) by mouth 3 (three) times daily as needed for dizziness. 60 tablet 1   metFORMIN (GLUCOPHAGE) 1000 MG tablet Take 1 tablet (1,000 mg total) by mouth daily with breakfast. 90 tablet 0   Multiple Vitamin (MULTIVITAMIN) tablet Take 1 tablet by mouth at bedtime.      pantoprazole (PROTONIX) 40 MG tablet Take 40 mg by mouth at bedtime.      prazosin (MINIPRESS) 1 MG capsule TAKE 1 CAPSULE BY MOUTH AT BEDTIME. ADD TO THE 4 MG YOU'RE ALREADY TAKING TO =5 MG. 90 capsule 0   prazosin (MINIPRESS) 2 MG capsule TAKE 2 CAPSULES BY MOUTH AT BEDTIME. 180 capsule 0   propranolol (INDERAL) 40 MG tablet TAKE 1 TABLET BY MOUTH 2 TIMES DAILY. 180 tablet 0   QUEtiapine (SEROQUEL) 200 MG tablet TAKE ONE AND A HALF (1.5) TABLETS (300 MG TOTAL) BY MOUTH AT BEDTIME. 135 tablet 0   No current facility-administered medications on file prior to visit.     PAST MEDICAL HISTORY: Past Medical History:    Diagnosis Date   Allergy    Zyrtec, Allegra.   Anxiety    followed by Dr. Toy Cookey   Back pain    Bipolar 1 disorder ([place])    Complication of anesthesia    Depression    Fibroid    Gallbladder problem    Gastroparesis 09/14/2012   gastric emptying study in 2014   GERD (gastroesophageal reflux disease)    Headache    Pneumonia    2013ish   PONV (postoperative nausea and vomiting)    Pre-diabetes    PTSD (post-traumatic stress disorder)    Sleep apnea    do not use a cpap   Tinnitus    Vitamin D deficiency    Wears glasses     PAST SURGICAL HISTORY: Past Surgical History:  Procedure Laterality Date   ANKLE ARTHROSCOPY Left 2011   CESAREAN SECTION     CHOLECYSTECTOMY     DILATION AND CURETTAGE OF UTERUS     FOREIGN BODY REMOVAL Left 11/18/2016   Procedure: REMOVAL FOREIGN BODY EXTREMITY LEFT FOOT;  Surgeon: Trula Slade, DPM;  Location: [place];  Service: Podiatry;  Laterality: Left;   PILONIDAL CYST EXCISION  1990's   RADIOLOGY WITH ANESTHESIA N/A 11/10/2018   Procedure: MRI WITH ANESTHESIA OF BRAIN AND ORBITS WITH AND WITHOUT CONTRAST;  Surgeon: Radiologist, Medication, MD;  Location: [place];  Service: Radiology;  Laterality: N/A;   TENDON REPAIR Left 2011   Left Ankle   WISDOM TOOTH EXTRACTION  19090's    SOCIAL HISTORY: Social History   Tobacco Use   Smoking status: Former Smoker    Years: 10.00    Types: Cigarettes    Quit date: 2003    Years since quitting: 17.8   Smokeless tobacco: Never Used  Substance Use Topics   Alcohol use: Yes    Alcohol/week: 0.0 standard drinks    Comment: rarely   Drug use: No    FAMILY HISTORY: Family History  Problem Relation Age of Onset   Cancer Mother        squamous cell carcinoma   Hyperlipidemia Mother    Depression Mother    Anxiety disorder Mother    Obesity Mother    Heart disease Father 33       cardiomegaly, CHF; steroid use.   Hyperlipidemia Father    Hypertension  Father    Mental retardation Father    High blood pressure Father    Depression  Father    Anxiety disorder Father    Obesity Father    Cancer Maternal Grandmother    Diabetes Maternal Grandmother    Heart disease Maternal Grandmother    Hyperlipidemia Maternal Grandmother    Hypertension Maternal Grandmother    Stroke Maternal Grandmother    Heart disease Paternal Grandmother    Hypertension Paternal Grandmother    Heart disease Paternal Grandfather    Hyperlipidemia Paternal Grandfather    Mental illness Paternal Grandfather    Rheum arthritis Sister    Multiple sclerosis Sister    Breast cancer Maternal Aunt    Thyroid cancer Paternal Aunt    ROS: Review of Systems  Gastrointestinal: Negative for nausea and vomiting.  Musculoskeletal:       Negative for muscle weakness.  Psychiatric/Behavioral: Positive for depression (emotional eating). Negative for suicidal ideas.       Negative for homicidal ideas.   PHYSICAL EXAM: Blood pressure 120/82, pulse 92, temperature 98.3 F (36.8 C), height 5\' 5"  (1.651 m), weight 286 lb (129.7 kg), SpO2 97 %. Body mass index is 47.59 kg/m. Physical Exam Vitals signs reviewed.  Constitutional:      Appearance: Normal appearance. She is obese.  Cardiovascular:     Rate and Rhythm: Normal rate.     Pulses: Normal pulses.  Pulmonary:     Effort: Pulmonary effort is normal.     Breath sounds: Normal breath sounds.  Musculoskeletal: Normal range of motion.  Skin:    General: Skin is warm and dry.  Neurological:     Mental Status: She is alert and oriented to person, place, and time.  Psychiatric:        Behavior: Behavior normal.   RECENT LABS AND TESTS: BMET    Component Value Date/Time   NA 135 04/07/2019 0606   NA 138 10/10/2018 0829   K 4.0 04/07/2019 0606   CL 102 04/07/2019 0606   CO2 23 04/07/2019 0606   GLUCOSE 109 (H) 04/07/2019 0606   BUN 14 04/07/2019 0606   BUN 12 10/10/2018 0829   CREATININE  1.00 04/07/2019 0606   CREATININE 0.95 06/27/2016 0933   CALCIUM 8.7 (L) 04/07/2019 0606   GFRNONAA >60 04/07/2019 0606   GFRAA >60 04/07/2019 0606   Lab Results  Component Value Date   HGBA1C 5.8 (H) 03/22/2019   HGBA1C 5.2 10/10/2018   HGBA1C 5.3 05/25/2018   HGBA1C 5.4 02/10/2018   HGBA1C 5.5 10/07/2017   Lab Results  Component Value Date   INSULIN 36.2 (H) 03/22/2019   INSULIN 21.4 10/10/2018   INSULIN 87.4 (H) 05/25/2018   INSULIN 20.3 02/10/2018   INSULIN 15.6 10/07/2017   CBC    Component Value Date/Time   WBC 8.3 04/07/2019 0606   RBC 4.07 04/07/2019 0606   HGB 11.7 (L) 04/07/2019 0606   HGB 11.2 06/22/2017 0904   HCT 37.1 04/07/2019 0606   HCT 34.8 06/22/2017 0904   PLT 253 04/07/2019 0606   PLT 332 11/12/2016 1651   MCV 91.2 04/07/2019 0606   MCV 86 06/22/2017 0904   MCH 28.7 04/07/2019 0606   MCHC 31.5 04/07/2019 0606   RDW 14.7 04/07/2019 0606   RDW 15.7 (H) 06/22/2017 0904   LYMPHSABS 2.0 04/07/2019 0606   LYMPHSABS 1.5 06/22/2017 0904   MONOABS 0.8 04/07/2019 0606   EOSABS 0.2 04/07/2019 0606   EOSABS 0.2 06/22/2017 0904   BASOSABS 0.0 04/07/2019 0606   BASOSABS 0.0 06/22/2017 0904   Iron/TIBC/Ferritin/ %Sat    Component Value  Date/Time   IRON 53 08/31/2016 1644   TIBC 280 08/31/2016 1644   FERRITIN 36 08/31/2016 1644   IRONPCTSAT 19 08/31/2016 1644   Lipid Panel     Component Value Date/Time   CHOL 157 03/22/2019 1607   TRIG 231 (H) 03/22/2019 1607   HDL 41 03/22/2019 1607   CHOLHDL 3.5 02/10/2018 0749   CHOLHDL 4.1 12/01/2015 0914   VLDL 37 (H) 12/01/2015 0914   LDLCALC 70 03/22/2019 1607   Hepatic Function Panel     Component Value Date/Time   PROT 7.3 04/07/2019 0606   PROT 6.9 10/10/2018 0829   ALBUMIN 3.4 (L) 04/07/2019 0606   ALBUMIN 3.9 10/10/2018 0829   AST 19 04/07/2019 0606   ALT 19 04/07/2019 0606   ALKPHOS 77 04/07/2019 0606   BILITOT 0.2 (L) 04/07/2019 0606   BILITOT <0.2 10/10/2018 0829      Component Value  Date/Time   TSH 1.620 08/31/2016 1644   TSH 1.83 12/01/2015 0914   Results for SHARONLEE, KUNDU (MRN [id number]) as of 08/01/2019 11:32  Ref. Range 10/10/2018 08:29  Vitamin D, 25-Hydroxy Latest Ref Range: 30.0 - 100.0 ng/mL 55.5   OBESITY BEHAVIORAL INTERVENTION VISIT  Today's visit was #59  Starting weight: 278 lbs Starting date: 08/31/2016 Today's weight: 286 lbs  Today's date: 07/31/2019 Total lbs lost to date: 0    07/31/2019  Height 5\' 5"  (1.651 m)  Weight 286 lb (129.7 kg)  BMI (Calculated) 47.59  BLOOD PRESSURE - SYSTOLIC 123456  BLOOD PRESSURE - DIASTOLIC 82   Body Fat % 123456 %  Total Body Water (lbs) 95.5 lbs   ASK: Caroline Gonzales discussed the diagnosis of obesity with Marjie Skiff Ilyas today and Kashawn agreed to give [name] permission to discuss obesity behavioral modification therapy today.  ASSESS: Kanda has the diagnosis of obesity and her BMI today is 47.7. Omari is in the action stage of change.   ADVISE: James was educated on the multiple health risks of obesity as well as the benefit of weight loss to improve her health. She was advised of the need for long term treatment and the importance of lifestyle modifications to improve her current health and to decrease her risk of future health problems.  AGREE: Multiple dietary modification options and treatment options were discussed and  Nazirah agreed to follow the recommendations documented in the above note.  ARRANGE: Oliviana was educated on the importance of frequent visits to treat obesity as outlined per CMS and USPSTF guidelines and agreed to schedule her next follow up appointment today.  Migdalia Dk, am acting as Location manager for [employer], DO   I have reviewed the above documentation for accuracy and completeness, and I agree with the above. -Jearld Lesch, DO

## 2019-08-02 ENCOUNTER — Encounter (INDEPENDENT_AMBULATORY_CARE_PROVIDER_SITE_OTHER): Payer: Self-pay | Admitting: Bariatrics

## 2019-08-02 DIAGNOSIS — F4310 Post-traumatic stress disorder, unspecified: Secondary | ICD-10-CM | POA: Diagnosis not present

## 2019-08-09 DIAGNOSIS — F4310 Post-traumatic stress disorder, unspecified: Secondary | ICD-10-CM | POA: Diagnosis not present

## 2019-08-16 ENCOUNTER — Other Ambulatory Visit: Payer: Self-pay | Admitting: Physician Assistant

## 2019-08-16 DIAGNOSIS — F4310 Post-traumatic stress disorder, unspecified: Secondary | ICD-10-CM | POA: Diagnosis not present

## 2019-08-16 MED FILL — GABAPENTIN 300 MG CAPSULE: 300 | 30 days supply | Qty: 90 | Fill #2

## 2019-08-17 NOTE — Telephone Encounter (Signed)
Next apt 09/2019

## 2019-08-18 MED FILL — clonazePAM 0.5 MG TABS: 0.5 | 30 days supply | Qty: 120 | Fill #0

## 2019-08-22 ENCOUNTER — Encounter (INDEPENDENT_AMBULATORY_CARE_PROVIDER_SITE_OTHER): Payer: Self-pay | Admitting: Bariatrics

## 2019-08-22 ENCOUNTER — Ambulatory Visit (INDEPENDENT_AMBULATORY_CARE_PROVIDER_SITE_OTHER): Payer: 59 | Admitting: Bariatrics

## 2019-08-22 ENCOUNTER — Other Ambulatory Visit: Payer: Self-pay

## 2019-08-22 VITALS — BP 127/70 | HR 84 | Temp 97.8°F | Ht 65.0 in | Wt 285.0 lb

## 2019-08-22 DIAGNOSIS — Z6841 Body Mass Index (BMI) 40.0 and over, adult: Secondary | ICD-10-CM

## 2019-08-22 DIAGNOSIS — F3289 Other specified depressive episodes: Secondary | ICD-10-CM

## 2019-08-22 DIAGNOSIS — Z9189 Other specified personal risk factors, not elsewhere classified: Secondary | ICD-10-CM

## 2019-08-22 DIAGNOSIS — R7303 Prediabetes: Secondary | ICD-10-CM

## 2019-08-22 MED ORDER — TROKENDI XR 50 MG PO CP24: 50.0000 mg | ORAL_CAPSULE | Freq: Every day | ORAL | 0 refills | Status: DC

## 2019-08-22 NOTE — Progress Notes (Signed)
Office: [phone]  /  Fax: [phone]   HPI:   Chief Complaint: OBESITY Caroline Gonzales is here to discuss her progress with her obesity treatment plan. She is on the Category 3 plan and is following her eating plan approximately 40% of the time. She states she is walking 20 minutes 5 times per week. Caroline Gonzales is down 1 lb. She states she is drinking more water. Her weight is 285 lb (129.3 kg) today and has had a weight loss of 1 pound over a period of 3 weeks since her last visit. She has lost 0 lbs since starting treatment with [name].  Depression with emotional eating behaviors Caroline Gonzales is struggling with emotional eating and using food for comfort to the extent that it is negatively impacting her health. She often snacks when she is not hungry. Caroline Gonzales sometimes feels she is out of control and then feels guilty that she made poor food choices. She has been working on behavior modification techniques to help reduce her emotional eating and has been somewhat successful. Caroline Gonzales reports no sodas and has decreased temptations for snacks. She shows no sign of suicidal or homicidal ideations.  Depression screen [facility] 2/9 03/29/2018 02/02/2018 04/14/2017 11/12/2016 08/31/2016  Decreased Interest 0 0 0 0 1  Down, Depressed, Hopeless 1 0 0 0 3  PHQ - 2 Score 1 0 0 0 4  Altered sleeping 3 - - - 1  Tired, decreased energy 1 - - - 3  Change in appetite 3 - - - 3  Feeling bad or failure about yourself  2 - - - 3  Trouble concentrating 0 - - - 0  Moving slowly or fidgety/restless 0 - - - 2  Suicidal thoughts 0 - - - 1  PHQ-9 Score 10 - - - 17   Pre-Diabetes Caroline Gonzales has a diagnosis of prediabetes based on her elevated Hgb A1c and was informed this puts her at greater risk of developing diabetes. Last A1c 5.8 on 03/22/2019 with an insulin of 36.2. She denies nausea or hypoglycemia. No polyphagia.  At risk for diabetes Caroline Gonzales is at higher than average risk for developing diabetes due to her obesity.    ASSESSMENT AND PLAN:  Prediabetes  Other depression - With emotional eating  - Plan: Topiramate ER (TROKENDI XR) 50 MG CP24  At risk for diabetes mellitus  Class 3 severe obesity with serious comorbidity and body mass index (BMI) of 45.0 to 49.9 in adult, unspecified obesity type ([place])  PLAN:  Emotional Eating Behaviors (other depression) Behavior modification techniques were discussed today to help Caroline Gonzales deal with her emotional/non-hunger eating behaviors. Caroline Gonzales was given a prescription for Trokendi 50 mg 1 PO daily #30 with 0 refills. She agrees to follow-up with our clinic in 2-4 weeks.  Pre-Diabetes Caroline Gonzales will continue to work on weight loss, exercise, decreasing simple carbohydrates, and increase protein and healthy fats to help decrease the risk of diabetes.   Diabetes risk counseling (~15 min) Caroline Gonzales is a 43 y.o. female and has risk factors for diabetes including obesity. We discussed intensive lifestyle modifications today with an emphasis on weight loss as well as increasing exercise and decreasing simple carbohydrates in her diet.  Obesity Caroline Gonzales is currently in the action stage of change. As such, her goal is to continue with weight loss efforts. She has agreed to follow the Category 3 plan. Caroline Gonzales will work on meal planning, will try not to skip meals, and will not bring any candy into the home. Caroline Gonzales has been  instructed to work up to a goal of 150 minutes of combined cardio and strengthening exercise per week for weight loss and overall health benefits. We discussed the following Behavioral Modification Strategies today: increasing lean protein intake, decreasing simple carbohydrates, increasing vegetables, increase H20 intake, decrease eating out, no skipping meals, work on meal planning and easy cooking plans, and keeping healthy foods in the home.  Caroline Gonzales has agreed to follow-up with our clinic in 2-4 weeks. She was informed of the importance of frequent  follow-up visits to maximize her success with intensive lifestyle modifications for her multiple health conditions.  ALLERGIES: Allergies  Allergen Reactions  . Penicillins Shortness Of Breath and Rash    Did it involve swelling of the face/tongue/throat, SOB, or low BP? Yes Did it involve sudden or severe rash/hives, skin peeling, or any reaction on the inside of your mouth or nose? Yes Did you need to seek medical attention at a hospital or doctor's office? Yes When did it last happen?childhood allergy If all above answers are "NO", may proceed with cephalosporin use.    . Tamiflu [Oseltamivir Phosphate] Anaphylaxis  . Tamiflu [Oseltamivir Phosphate] Anaphylaxis  . Cephalosporins Rash  . Latex Rash    MEDICATIONS: Current Outpatient Medications on File Prior to Visit  Medication Sig Dispense Refill  . BIOTIN PO Take by mouth.    . cetirizine (ZYRTEC) 10 MG tablet Take 10 mg by mouth daily.    . Cholecalciferol (VITAMIN D3) 5000 units CAPS Take 5,000 Units by mouth at bedtime.    . clonazePAM (KLONOPIN) 0.5 MG tablet TAKE 1 TABLET BY MOUTH EVERY 6 HOURS AS NEEDED FOR ANXIETY. 120 tablet 1  . gabapentin (NEURONTIN) 300 MG capsule Take 1 capsule (300 mg total) by mouth at bedtime. (Patient taking differently: Take 300 mg by mouth 3 (three) times daily. 1 q am prn, 2 at hs.) 90 capsule 0  . ibuprofen (ADVIL,MOTRIN) 200 MG tablet Take 400 mg by mouth daily as needed for headache or moderate pain.    [name] L-Methylfolate-Algae (DEPLIN 15) 15-90.314 MG CAPS Take 1 capsule by mouth daily. 90 capsule 3  . lamoTRIgine (LAMICTAL) 150 MG tablet TAKE 1 TABLET BY MOUTH TWICE DAILY 180 tablet 0  . levonorgestrel (MIRENA) 20 MCG/24HR IUD 1 each by Intrauterine route once.     . Multiple Vitamin (MULTIVITAMIN) tablet Take 1 tablet by mouth at bedtime.     . prazosin (MINIPRESS) 1 MG capsule TAKE 1 CAPSULE BY MOUTH AT BEDTIME. ADD TO THE 4 MG YOU'RE ALREADY TAKING TO =5 MG. 90 capsule 0  .  prazosin (MINIPRESS) 2 MG capsule TAKE 2 CAPSULES BY MOUTH AT BEDTIME. 180 capsule 0  . propranolol (INDERAL) 40 MG tablet TAKE 1 TABLET BY MOUTH 2 TIMES DAILY. 180 tablet 0  . QUEtiapine (SEROQUEL) 200 MG tablet TAKE ONE AND A HALF (1.5) TABLETS (300 MG TOTAL) BY MOUTH AT BEDTIME. 135 tablet 0  . fexofenadine (ALLEGRA) 180 MG tablet Take 180 mg by mouth at bedtime.    . meclizine (ANTIVERT) 25 MG tablet Take 1 tablet (25 mg total) by mouth 3 (three) times daily as needed for dizziness. 60 tablet 1  . metFORMIN (GLUCOPHAGE) 1000 MG tablet Take 1 tablet (1,000 mg total) by mouth daily with breakfast. 90 tablet 0  . pantoprazole (PROTONIX) 40 MG tablet Take 40 mg by mouth at bedtime.      No current facility-administered medications on file prior to visit.     PAST MEDICAL HISTORY: Past Medical  History:  Diagnosis Date  . Allergy    Zyrtec, Allegra.  . Anxiety    followed by Dr. Toy Cookey  . Back pain   . Bipolar 1 disorder ([place])   . Complication of anesthesia   . Depression   . Fibroid   . Gallbladder problem   . Gastroparesis 09/14/2012   gastric emptying study in 2014  . GERD (gastroesophageal reflux disease)   . Headache   . Pneumonia    2013ish  . PONV (postoperative nausea and vomiting)   . Pre-diabetes   . PTSD (post-traumatic stress disorder)   . Sleep apnea    do not use a cpap  . Tinnitus   . Vitamin D deficiency   . Wears glasses     PAST SURGICAL HISTORY: Past Surgical History:  Procedure Laterality Date  . ANKLE ARTHROSCOPY Left 2011  . CESAREAN SECTION    . CHOLECYSTECTOMY    . DILATION AND CURETTAGE OF UTERUS    . FOREIGN BODY REMOVAL Left 11/18/2016   Procedure: REMOVAL FOREIGN BODY EXTREMITY LEFT FOOT;  Surgeon: Trula Slade, DPM;  Location: [facility];  Service: Podiatry;  Laterality: Left;  . PILONIDAL CYST EXCISION  1990's  . RADIOLOGY WITH ANESTHESIA N/A 11/10/2018   Procedure: MRI WITH ANESTHESIA OF BRAIN AND ORBITS WITH AND WITHOUT CONTRAST;  Surgeon:  Radiologist, Medication, MD;  Location: [facility];  Service: Radiology;  Laterality: N/A;  . TENDON REPAIR Left 2011   Left Ankle  . WISDOM TOOTH EXTRACTION  19090's    SOCIAL HISTORY: Social History   Tobacco Use  . Smoking status: Former Smoker    Years: 10.00    Types: Cigarettes    Quit date: 2003    Years since quitting: 17.9  . Smokeless tobacco: Never Used  Substance Use Topics  . Alcohol use: Yes    Alcohol/week: 0.0 standard drinks    Comment: rarely  . Drug use: No    FAMILY HISTORY: Family History  Problem Relation Age of Onset  . Cancer Mother        squamous cell carcinoma  . Hyperlipidemia Mother   . Depression Mother   . Anxiety disorder Mother   . Obesity Mother   . Heart disease Father 98       cardiomegaly, CHF; steroid use.  [name] Hyperlipidemia Father   . Hypertension Father   . Mental retardation Father   . High blood pressure Father   . Depression Father   . Anxiety disorder Father   . Obesity Father   . Cancer Maternal Grandmother   . Diabetes Maternal Grandmother   . Heart disease Maternal Grandmother   . Hyperlipidemia Maternal Grandmother   . Hypertension Maternal Grandmother   . Stroke Maternal Grandmother   . Heart disease Paternal Grandmother   . Hypertension Paternal Grandmother   . Heart disease Paternal Grandfather   . Hyperlipidemia Paternal Grandfather   . Mental illness Paternal Grandfather   . Rheum arthritis Sister   . Multiple sclerosis Sister   . Breast cancer Maternal Aunt   . Thyroid cancer Paternal Aunt    ROS: Review of Systems  Gastrointestinal: Negative for nausea.  Endo/Heme/Allergies:       Negative for hypoglycemia. Negative for polyphagia.  Psychiatric/Behavioral: Positive for depression (emotional eating). Negative for suicidal ideas.       Negative for homicidal ideas.   PHYSICAL EXAM: Blood pressure 127/70, pulse 84, temperature 97.8 F (36.6 C), height 5\' 5"  (1.651 m), weight 285 lb (129.3  kg), SpO2 99 %.  Body mass index is 47.43 kg/m. Physical Exam Vitals signs reviewed.  Constitutional:      Appearance: Normal appearance. She is obese.  Cardiovascular:     Rate and Rhythm: Normal rate.     Pulses: Normal pulses.  Pulmonary:     Effort: Pulmonary effort is normal.     Breath sounds: Normal breath sounds.  Musculoskeletal: Normal range of motion.  Skin:    General: Skin is warm and dry.  Neurological:     Mental Status: She is alert and oriented to person, place, and time.  Psychiatric:        Behavior: Behavior normal.   RECENT LABS AND TESTS: BMET    Component Value Date/Time   NA 135 04/07/2019 0606   NA 138 10/10/2018 0829   K 4.0 04/07/2019 0606   CL 102 04/07/2019 0606   CO2 23 04/07/2019 0606   GLUCOSE 109 (H) 04/07/2019 0606   BUN 14 04/07/2019 0606   BUN 12 10/10/2018 0829   CREATININE 1.00 04/07/2019 0606   CREATININE 0.95 06/27/2016 0933   CALCIUM 8.7 (L) 04/07/2019 0606   GFRNONAA >60 04/07/2019 0606   GFRAA >60 04/07/2019 0606   Lab Results  Component Value Date   HGBA1C 5.8 (H) 03/22/2019   HGBA1C 5.2 10/10/2018   HGBA1C 5.3 05/25/2018   HGBA1C 5.4 02/10/2018   HGBA1C 5.5 10/07/2017   Lab Results  Component Value Date   INSULIN 36.2 (H) 03/22/2019   INSULIN 21.4 10/10/2018   INSULIN 87.4 (H) 05/25/2018   INSULIN 20.3 02/10/2018   INSULIN 15.6 10/07/2017   CBC    Component Value Date/Time   WBC 8.3 04/07/2019 0606   RBC 4.07 04/07/2019 0606   HGB 11.7 (L) 04/07/2019 0606   HGB 11.2 06/22/2017 0904   HCT 37.1 04/07/2019 0606   HCT 34.8 06/22/2017 0904   PLT 253 04/07/2019 0606   PLT 332 11/12/2016 1651   MCV 91.2 04/07/2019 0606   MCV 86 06/22/2017 0904   MCH 28.7 04/07/2019 0606   MCHC 31.5 04/07/2019 0606   RDW 14.7 04/07/2019 0606   RDW 15.7 (H) 06/22/2017 0904   LYMPHSABS 2.0 04/07/2019 0606   LYMPHSABS 1.5 06/22/2017 0904   MONOABS 0.8 04/07/2019 0606   EOSABS 0.2 04/07/2019 0606   EOSABS 0.2 06/22/2017 0904   BASOSABS 0.0  04/07/2019 0606   BASOSABS 0.0 06/22/2017 0904   Iron/TIBC/Ferritin/ %Sat    Component Value Date/Time   IRON 53 08/31/2016 1644   TIBC 280 08/31/2016 1644   FERRITIN 36 08/31/2016 1644   IRONPCTSAT 19 08/31/2016 1644   Lipid Panel     Component Value Date/Time   CHOL 157 03/22/2019 1607   TRIG 231 (H) 03/22/2019 1607   HDL 41 03/22/2019 1607   CHOLHDL 3.5 02/10/2018 0749   CHOLHDL 4.1 12/01/2015 0914   VLDL 37 (H) 12/01/2015 0914   LDLCALC 70 03/22/2019 1607   Hepatic Function Panel     Component Value Date/Time   PROT 7.3 04/07/2019 0606   PROT 6.9 10/10/2018 0829   ALBUMIN 3.4 (L) 04/07/2019 0606   ALBUMIN 3.9 10/10/2018 0829   AST 19 04/07/2019 0606   ALT 19 04/07/2019 0606   ALKPHOS 77 04/07/2019 0606   BILITOT 0.2 (L) 04/07/2019 0606   BILITOT <0.2 10/10/2018 0829      Component Value Date/Time   TSH 1.620 08/31/2016 1644   TSH 1.83 12/01/2015 0914   Results for JAN, BRIST (MRN [id number])  as of 08/22/2019 14:13  Ref. Range 10/10/2018 08:29  Vitamin D, 25-Hydroxy Latest Ref Range: 30.0 - 100.0 ng/mL 55.5   OBESITY BEHAVIORAL INTERVENTION VISIT  Today's visit was #60  Starting weight: 278 lbs Starting date: 08/31/2016 Today's weight: 285 lbs  Today's date: 08/22/2019 Total lbs lost to date: 0     08/22/2019  Height 5\' 5"  (1.651 m)  Weight 285 lb (129.3 kg)  BMI (Calculated) 47.43  BLOOD PRESSURE - SYSTOLIC [id number]  BLOOD PRESSURE - DIASTOLIC 70   Body Fat % [id number] %  Total Body Water (lbs) 95.4 lbs   ASK: We discussed the diagnosis of obesity with Caroline Gonzales today and Antanasia agreed to give [name] permission to discuss obesity behavioral modification therapy today.  ASSESS: Caroline Gonzales has the diagnosis of obesity and her BMI today is 47.5. Caroline Gonzales is in the action stage of change.   ADVISE: Caroline Gonzales was educated on the multiple health risks of obesity as well as the benefit of weight loss to improve her health. She was advised of the need for  long term treatment and the importance of lifestyle modifications to improve her current health and to decrease her risk of future health problems.  AGREE: Multiple dietary modification options and treatment options were discussed and  Caroline Gonzales agreed to follow the recommendations documented in the above note.  ARRANGE: Caroline Gonzales was educated on the importance of frequent visits to treat obesity as outlined per CMS and USPSTF guidelines and agreed to schedule her next follow up appointment today.  Caroline Gonzales, am acting as Location manager for [employer], DO  I have reviewed the above documentation for accuracy and completeness, and I agree with the above. -Jearld Lesch, DO

## 2019-08-23 DIAGNOSIS — F4310 Post-traumatic stress disorder, unspecified: Secondary | ICD-10-CM | POA: Diagnosis not present

## 2019-08-24 MED FILL — TROKENDI XR 50 MG CAPSULE: 50 | 30 days supply | Qty: 30 | Fill #0

## 2019-08-30 DIAGNOSIS — F4310 Post-traumatic stress disorder, unspecified: Secondary | ICD-10-CM | POA: Diagnosis not present

## 2019-09-01 ENCOUNTER — Ambulatory Visit: Payer: Self-pay

## 2019-09-01 ENCOUNTER — Other Ambulatory Visit: Payer: Self-pay

## 2019-09-01 ENCOUNTER — Other Ambulatory Visit: Payer: Self-pay | Admitting: Family Medicine

## 2019-09-01 DIAGNOSIS — M79671 Pain in right foot: Secondary | ICD-10-CM

## 2019-09-01 DIAGNOSIS — M79672 Pain in left foot: Secondary | ICD-10-CM

## 2019-09-01 DIAGNOSIS — M25572 Pain in left ankle and joints of left foot: Secondary | ICD-10-CM

## 2019-09-01 DIAGNOSIS — M25571 Pain in right ankle and joints of right foot: Secondary | ICD-10-CM

## 2019-09-05 ENCOUNTER — Ambulatory Visit (INDEPENDENT_AMBULATORY_CARE_PROVIDER_SITE_OTHER): Payer: 59 | Admitting: Bariatrics

## 2019-09-05 ENCOUNTER — Encounter: Payer: Self-pay | Admitting: Family Medicine

## 2019-09-05 ENCOUNTER — Other Ambulatory Visit: Payer: Self-pay

## 2019-09-05 ENCOUNTER — Encounter (INDEPENDENT_AMBULATORY_CARE_PROVIDER_SITE_OTHER): Payer: Self-pay | Admitting: Bariatrics

## 2019-09-05 VITALS — BP 121/76 | HR 81 | Temp 97.5°F | Ht 65.0 in | Wt 286.0 lb

## 2019-09-05 DIAGNOSIS — Z9189 Other specified personal risk factors, not elsewhere classified: Secondary | ICD-10-CM

## 2019-09-05 DIAGNOSIS — F3289 Other specified depressive episodes: Secondary | ICD-10-CM

## 2019-09-05 DIAGNOSIS — R7303 Prediabetes: Secondary | ICD-10-CM

## 2019-09-05 MED ORDER — TROKENDI XR 50 MG PO CP24: 50.0000 mg | ORAL_CAPSULE | Freq: Every day | ORAL | 0 refills | Status: DC

## 2019-09-05 NOTE — Progress Notes (Signed)
Office: [phone]  /  Fax: [phone]   HPI:  Chief Complaint: OBESITY Caroline Gonzales is here to discuss her progress with her obesity treatment plan. She is on the Category 3 plan and states she is following her eating plan approximately 15% of the time. She states she is walking 20 minutes 5 times per week.  Caroline Gonzales is up 1 lb. She is taking Trokendi and is trying to drink adequate water. She struggles with adhering to the plan.  Today's visit was #61  Starting weight: 278 lbs Starting date: 08/31/2016 Today's weight: 286 lbs  Today's date: 09/05/2019 Total lbs lost to date: 0 Total lbs lost since last in-office visit: 0   Other Depression Caroline Gonzales has no suicidal or homicidal ideations.  Prediabetes Caroline Gonzales has a diagnosis of prediabetes with no polyphagia. Last A1c 5.8 on 03/22/2019 with an insulin of 36.2.  At risk for diabetes Caroline Gonzales is at higher than average risk for developing diabetes due to her obesity.   ASSESSMENT AND PLAN:  Prediabetes - Plan: Comprehensive Metabolic Panel (CMET), HgB A1c, Insulin, random, Vitamin D (25 hydroxy)  Other depression - With emotional eating  - Plan: Topiramate ER (TROKENDI XR) 50 MG CP24  At risk for diabetes mellitus  PLAN:  Other Depression We discussed CBT techniques. She was given a prescription for Trokendi 50 mg #30 with 0 refills and agrees to follow-up with our clinic in 2-3 weeks.  Pre-Diabetes Caroline Gonzales will continue to work on weight loss, exercise, and decreasing carbohydrates, and increasing healthy fats and protein to help decrease the risk of diabetes.   Diabetes risk counseling (~15 min) Caroline Gonzales is a 43 y.o. female and has risk factors for diabetes including obesity. We discussed intensive lifestyle modifications today with an emphasis on weight loss as well as increasing exercise and decreasing simple carbohydrates in her diet.  Obesity Caroline Gonzales is currently in the action stage of change. As such, her goal is to  continue with weight loss efforts. She has agreed to follow the Category 3 plan. Caroline Gonzales will eat protein with each meal. Caroline Gonzales has been instructed to work up to a goal of 150 minutes of combined cardio and strengthening exercise per week for weight loss and overall health benefits. We discussed the following Behavioral Modification Strategies today: increasing lean protein intake, decreasing simple carbohydrates, increasing vegetables, increase H20 intake, decrease eating out, no skipping meals, work on meal planning and easy cooking plans, keeping healthy foods in the home, and planning for success.  Caroline Gonzales has agreed to follow-up with our clinic in 2-3 weeks. She was informed of the importance of frequent follow-up visits to maximize her success with intensive lifestyle modifications for her multiple health conditions.  ALLERGIES: Allergies  Allergen Reactions  . Penicillins Shortness Of Breath and Rash    Did it involve swelling of the face/tongue/throat, SOB, or low BP? Yes Did it involve sudden or severe rash/hives, skin peeling, or any reaction on the inside of your mouth or nose? Yes Did you need to seek medical attention at a hospital or doctor's office? Yes When did it last happen?childhood allergy If all above answers are "NO", may proceed with cephalosporin use.    . Tamiflu [Oseltamivir Phosphate] Anaphylaxis  . Tamiflu [Oseltamivir Phosphate] Anaphylaxis  . Cephalosporins Rash  . Latex Rash    MEDICATIONS: Current Outpatient Medications on File Prior to Visit  Medication Sig Dispense Refill  . BIOTIN PO Take by mouth.    . cetirizine (ZYRTEC) 10 MG tablet Take 10  mg by mouth daily.    . Cholecalciferol (VITAMIN D3) 5000 units CAPS Take 5,000 Units by mouth at bedtime.    . clonazePAM (KLONOPIN) 0.5 MG tablet TAKE 1 TABLET BY MOUTH EVERY 6 HOURS AS NEEDED FOR ANXIETY. 120 tablet 1  . gabapentin (NEURONTIN) 300 MG capsule Take 1 capsule (300 mg total) by mouth at  bedtime. (Patient taking differently: Take 300 mg by mouth 3 (three) times daily. 1 q am prn, 2 at hs.) 90 capsule 0  . ibuprofen (ADVIL,MOTRIN) 200 MG tablet Take 400 mg by mouth daily as needed for headache or moderate pain.    [name] L-Methylfolate-Algae (DEPLIN 15) 15-90.314 MG CAPS Take 1 capsule by mouth daily. 90 capsule 3  . lamoTRIgine (LAMICTAL) 150 MG tablet TAKE 1 TABLET BY MOUTH TWICE DAILY 180 tablet 0  . levonorgestrel (MIRENA) 20 MCG/24HR IUD 1 each by Intrauterine route once.     . Multiple Vitamin (MULTIVITAMIN) tablet Take 1 tablet by mouth at bedtime.     . prazosin (MINIPRESS) 1 MG capsule TAKE 1 CAPSULE BY MOUTH AT BEDTIME. ADD TO THE 4 MG YOU'RE ALREADY TAKING TO =5 MG. 90 capsule 0  . prazosin (MINIPRESS) 2 MG capsule TAKE 2 CAPSULES BY MOUTH AT BEDTIME. 180 capsule 0  . propranolol (INDERAL) 40 MG tablet TAKE 1 TABLET BY MOUTH 2 TIMES DAILY. 180 tablet 0  . QUEtiapine (SEROQUEL) 200 MG tablet TAKE ONE AND A HALF (1.5) TABLETS (300 MG TOTAL) BY MOUTH AT BEDTIME. 135 tablet 0  . fexofenadine (ALLEGRA) 180 MG tablet Take 180 mg by mouth at bedtime.    . meclizine (ANTIVERT) 25 MG tablet Take 1 tablet (25 mg total) by mouth 3 (three) times daily as needed for dizziness. 60 tablet 1  . metFORMIN (GLUCOPHAGE) 1000 MG tablet Take 1 tablet (1,000 mg total) by mouth daily with breakfast. 90 tablet 0  . pantoprazole (PROTONIX) 40 MG tablet Take 40 mg by mouth at bedtime.      No current facility-administered medications on file prior to visit.    PAST MEDICAL HISTORY: Past Medical History:  Diagnosis Date  . Allergy    Zyrtec, Allegra.  . Anxiety    followed by Dr. Toy Cookey  . Back pain   . Bipolar 1 disorder ([place])   . Complication of anesthesia   . Depression   . Fibroid   . Gallbladder problem   . Gastroparesis 09/14/2012   gastric emptying study in 2014  . GERD (gastroesophageal reflux disease)   . Headache   . Pneumonia    2013ish  . PONV (postoperative nausea and  vomiting)   . Pre-diabetes   . PTSD (post-traumatic stress disorder)   . Sleep apnea    do not use a cpap  . Tinnitus   . Vitamin D deficiency   . Wears glasses     PAST SURGICAL HISTORY: Past Surgical History:  Procedure Laterality Date  . ANKLE ARTHROSCOPY Left 2011  . CESAREAN SECTION    . CHOLECYSTECTOMY    . DILATION AND CURETTAGE OF UTERUS    . FOREIGN BODY REMOVAL Left 11/18/2016   Procedure: REMOVAL FOREIGN BODY EXTREMITY LEFT FOOT;  Surgeon: Trula Slade, DPM;  Location: [facility];  Service: Podiatry;  Laterality: Left;  . PILONIDAL CYST EXCISION  1990's  . RADIOLOGY WITH ANESTHESIA N/A 11/10/2018   Procedure: MRI WITH ANESTHESIA OF BRAIN AND ORBITS WITH AND WITHOUT CONTRAST;  Surgeon: Radiologist, Medication, MD;  Location: [facility];  Service: Radiology;  Laterality: N/A;  . TENDON REPAIR Left 2011   Left Ankle  . WISDOM TOOTH EXTRACTION  19090's    SOCIAL HISTORY: Social History   Tobacco Use  . Smoking status: Former Smoker    Years: 10.00    Types: Cigarettes    Quit date: 2003    Years since quitting: 17.9  . Smokeless tobacco: Never Used  Substance Use Topics  . Alcohol use: Yes    Alcohol/week: 0.0 standard drinks    Comment: rarely  . Drug use: No    FAMILY HISTORY: Family History  Problem Relation Age of Onset  . Cancer Mother        squamous cell carcinoma  . Hyperlipidemia Mother   . Depression Mother   . Anxiety disorder Mother   . Obesity Mother   . Heart disease Father 58       cardiomegaly, CHF; steroid use.  [name] Hyperlipidemia Father   . Hypertension Father   . Mental retardation Father   . High blood pressure Father   . Depression Father   . Anxiety disorder Father   . Obesity Father   . Cancer Maternal Grandmother   . Diabetes Maternal Grandmother   . Heart disease Maternal Grandmother   . Hyperlipidemia Maternal Grandmother   . Hypertension Maternal Grandmother   . Stroke Maternal Grandmother   . Heart disease Paternal  Grandmother   . Hypertension Paternal Grandmother   . Heart disease Paternal Grandfather   . Hyperlipidemia Paternal Grandfather   . Mental illness Paternal Grandfather   . Rheum arthritis Sister   . Multiple sclerosis Sister   . Breast cancer Maternal Aunt   . Thyroid cancer Paternal Aunt    ROS: Review of Systems  Constitutional: Negative for weight loss.  Endo/Heme/Allergies:       Negative for polyphagia.  Psychiatric/Behavioral: Positive for depression. Negative for suicidal ideas.       Negative for homicidal ideas.   PHYSICAL EXAM: Blood pressure 121/76, pulse 81, temperature (!) 97.5 F (36.4 C), height 5\' 5"  (1.651 m), weight 286 lb (129.7 kg), SpO2 97 %. Body mass index is 47.59 kg/m. Physical Exam Vitals reviewed.  Constitutional:      Appearance: Normal appearance. She is obese.  Cardiovascular:     Rate and Rhythm: Normal rate.     Pulses: Normal pulses.  Pulmonary:     Effort: Pulmonary effort is normal.     Breath sounds: Normal breath sounds.  Musculoskeletal:        General: Normal range of motion.  Skin:    General: Skin is warm and dry.  Neurological:     Mental Status: She is alert and oriented to person, place, and time.  Psychiatric:        Behavior: Behavior normal.   RECENT LABS AND TESTS: BMET    Component Value Date/Time   NA 135 04/07/2019 0606   NA 138 10/10/2018 0829   K 4.0 04/07/2019 0606   CL 102 04/07/2019 0606   CO2 23 04/07/2019 0606   GLUCOSE 109 (H) 04/07/2019 0606   BUN 14 04/07/2019 0606   BUN 12 10/10/2018 0829   CREATININE 1.00 04/07/2019 0606   CREATININE 0.95 06/27/2016 0933   CALCIUM 8.7 (L) 04/07/2019 0606   GFRNONAA >60 04/07/2019 0606   GFRAA >60 04/07/2019 0606   Lab Results  Component Value Date   HGBA1C 5.8 (H) 03/22/2019   HGBA1C 5.2 10/10/2018   HGBA1C 5.3 05/25/2018   HGBA1C 5.4 02/10/2018  HGBA1C 5.5 10/07/2017   Lab Results  Component Value Date   INSULIN 36.2 (H) 03/22/2019   INSULIN 21.4  10/10/2018   INSULIN 87.4 (H) 05/25/2018   INSULIN 20.3 02/10/2018   INSULIN 15.6 10/07/2017   CBC    Component Value Date/Time   WBC 8.3 04/07/2019 0606   RBC 4.07 04/07/2019 0606   HGB 11.7 (L) 04/07/2019 0606   HGB 11.2 06/22/2017 0904   HCT 37.1 04/07/2019 0606   HCT 34.8 06/22/2017 0904   PLT 253 04/07/2019 0606   PLT 332 11/12/2016 1651   MCV 91.2 04/07/2019 0606   MCV 86 06/22/2017 0904   MCH 28.7 04/07/2019 0606   MCHC 31.5 04/07/2019 0606   RDW 14.7 04/07/2019 0606   RDW 15.7 (H) 06/22/2017 0904   LYMPHSABS 2.0 04/07/2019 0606   LYMPHSABS 1.5 06/22/2017 0904   MONOABS 0.8 04/07/2019 0606   EOSABS 0.2 04/07/2019 0606   EOSABS 0.2 06/22/2017 0904   BASOSABS 0.0 04/07/2019 0606   BASOSABS 0.0 06/22/2017 0904   Iron/TIBC/Ferritin/ %Sat    Component Value Date/Time   IRON 53 08/31/2016 1644   TIBC 280 08/31/2016 1644   FERRITIN 36 08/31/2016 1644   IRONPCTSAT 19 08/31/2016 1644   Lipid Panel     Component Value Date/Time   CHOL 157 03/22/2019 1607   TRIG 231 (H) 03/22/2019 1607   HDL 41 03/22/2019 1607   CHOLHDL 3.5 02/10/2018 0749   CHOLHDL 4.1 12/01/2015 0914   VLDL 37 (H) 12/01/2015 0914   LDLCALC 70 03/22/2019 1607   Hepatic Function Panel     Component Value Date/Time   PROT 7.3 04/07/2019 0606   PROT 6.9 10/10/2018 0829   ALBUMIN 3.4 (L) 04/07/2019 0606   ALBUMIN 3.9 10/10/2018 0829   AST 19 04/07/2019 0606   ALT 19 04/07/2019 0606   ALKPHOS 77 04/07/2019 0606   BILITOT 0.2 (L) 04/07/2019 0606   BILITOT <0.2 10/10/2018 0829      Component Value Date/Time   TSH 1.620 08/31/2016 1644   TSH 1.83 12/01/2015 0914    OBESITY BEHAVIORAL INTERVENTION VISIT DOCUMENTATION FOR INSURANCE (~15 minutes)  I, Michaelene Song, am acting as Location manager for [employer], DO  I have reviewed the above documentation for accuracy and completeness, and I agree with the above. Jearld Lesch, DO

## 2019-09-06 LAB — COMPREHENSIVE METABOLIC PANEL
ALT: 15 IU/L (ref 0–32)
AST: 16 IU/L (ref 0–40)
Albumin/Globulin Ratio: 1.3 (ref 1.2–2.2)
Albumin: 3.9 g/dL (ref 3.8–4.8)
Alkaline Phosphatase: 103 IU/L (ref 39–117)
BUN/Creatinine Ratio: 11 (ref 9–23)
BUN: 11 mg/dL (ref 6–24)
Bilirubin Total: 0.2 mg/dL (ref 0.0–1.2)
CO2: 24 mmol/L (ref 20–29)
Calcium: 9 mg/dL (ref 8.7–10.2)
Chloride: 102 mmol/L (ref 96–106)
Creatinine, Ser: 1.02 mg/dL — ABNORMAL HIGH (ref 0.57–1.00)
GFR calc Af Amer: 78 mL/min/{1.73_m2} (ref >59)
GFR calc non Af Amer: 68 mL/min/{1.73_m2} (ref >59)
Globulin, Total: 3.1 g/dL (ref 1.5–4.5)
Glucose: 91 mg/dL (ref 65–99)
Potassium: 4.4 mmol/L (ref 3.5–5.2)
Sodium: 137 mmol/L (ref 134–144)
Total Protein: 7 g/dL (ref 6.0–8.5)

## 2019-09-06 LAB — INSULIN, RANDOM: INSULIN: 25.4 u[IU]/mL — ABNORMAL HIGH (ref 2.6–24.9)

## 2019-09-06 LAB — HEMOGLOBIN A1C
Est. average glucose Bld gHb Est-mCnc: 117 mg/dL
Hgb A1c MFr Bld: 5.7 % — ABNORMAL HIGH (ref 4.8–5.6)

## 2019-09-06 LAB — VITAMIN D 25 HYDROXY (VIT D DEFICIENCY, FRACTURES): Vit D, 25-Hydroxy: 52 ng/mL (ref 30.0–100.0)

## 2019-09-18 ENCOUNTER — Other Ambulatory Visit: Payer: Self-pay | Admitting: Physician Assistant

## 2019-09-18 MED FILL — clonazePAM 0.5 MG TABS: 0.5 | 30 days supply | Qty: 120 | Fill #1

## 2019-09-19 MED FILL — PRAZOSIN 2 MG CAPSULE: 2 | 90 days supply | Qty: 180 | Fill #0

## 2019-09-19 MED FILL — PROPRANOLOL 40 MG TABLET: 40 | 90 days supply | Qty: 180 | Fill #0

## 2019-09-19 MED FILL — PRAZOSIN 1 MG CAPSULE: 1 | 90 days supply | Qty: 90 | Fill #0

## 2019-09-19 MED FILL — QUETIAPINE FUMARATE 200 MG: 200 | 90 days supply | Qty: 135 | Fill #0

## 2019-09-20 DIAGNOSIS — F4310 Post-traumatic stress disorder, unspecified: Secondary | ICD-10-CM | POA: Diagnosis not present

## 2019-09-25 ENCOUNTER — Ambulatory Visit (INDEPENDENT_AMBULATORY_CARE_PROVIDER_SITE_OTHER): Payer: 59 | Admitting: Physician Assistant

## 2019-09-25 ENCOUNTER — Other Ambulatory Visit: Payer: Self-pay

## 2019-09-25 ENCOUNTER — Encounter: Payer: Self-pay | Admitting: Physician Assistant

## 2019-09-25 VITALS — BP 129/83 | HR 91

## 2019-09-25 DIAGNOSIS — G4700 Insomnia, unspecified: Secondary | ICD-10-CM | POA: Diagnosis not present

## 2019-09-25 DIAGNOSIS — F331 Major depressive disorder, recurrent, moderate: Secondary | ICD-10-CM

## 2019-09-25 DIAGNOSIS — G4733 Obstructive sleep apnea (adult) (pediatric): Secondary | ICD-10-CM | POA: Diagnosis not present

## 2019-09-25 DIAGNOSIS — F4310 Post-traumatic stress disorder, unspecified: Secondary | ICD-10-CM | POA: Diagnosis not present

## 2019-09-25 DIAGNOSIS — F411 Generalized anxiety disorder: Secondary | ICD-10-CM

## 2019-09-25 NOTE — Progress Notes (Signed)
[facility] Med Check  Patient ID: Caroline Gonzales,  MRN: [id number]  PCP: Ronnald Nian, DO  Date of Evaluation: 09/25/2019 Time spent:25 minutes  Chief Complaint:  Chief Complaint    Follow-up       HISTORY/CURRENT STATUS: HPI for 51-month med check.  Patient states she is doing really well overall.  Since her last visit, she increased the prazosin up a little more.  We had discussed that so she felt like it would be safe to do.  She is currently at 6 mg and is tolerating it very well.  She denies any orthostatic symptoms.  She was having more disturbing dreams although not night terrors like in the past, but since increasing the dose, she is not really remembering any dreams at all.  She is able to sleep through most every night and feels rested when she wakes up.  Work is going well.  She is an Therapist, sports.  Starts back to school next week after having almost 2 months off for a break.  She had a good holiday season.  Anxiety is well controlled.  The Klonopin helps a lot.  The gabapentin is also helpful in the evening to help take the edge off from the day.  She is able to enjoy things.  Denies problems with energy or motivation.  She does not cry easily.  Her son is not being bullied at school and that has taken a huge load off of her.  He is doing well emotionally to.  He is 13.  She has been started on Trokendi by her weight loss doctor.  Since adding that, she does have times where she cannot think clearly that other than that, concentration is good.  She is able to function at work without problems.  She denies suicidal or homicidal thoughts.  Denies dizziness, syncope, seizures, numbness, tingling, tremor, tics, unsteady gait, slurred speech, confusion. Denies muscle or joint pain, stiffness, or dystonia.  Individual Medical History/ Review of Systems: Changes? :No    Past medications for mental health diagnoses include: Prozac, Paxil, Effexor, Norpramin, Wellbutrin, doxepin,  Elavil, Geodon, Abilify, Lexapro, Trileptal, Ambien, Lunesta, Sonata, trazodone, Restoril, Remeron, Seroquel, Klonopin, propranolol, Lamictal, Deplin, Sonata, Xanax made her feel horrible, Ativan didn't help  Allergies: Penicillins, Tamiflu [oseltamivir phosphate], Tamiflu [oseltamivir phosphate], Cephalosporins, and Latex  Current Medications:  Current Outpatient Medications:  .  BIOTIN PO, Take by mouth., Disp: , Rfl:  .  cetirizine (ZYRTEC) 10 MG tablet, Take 10 mg by mouth daily., Disp: , Rfl:  .  Cholecalciferol (VITAMIN D3) 5000 units CAPS, Take 5,000 Units by mouth at bedtime., Disp: , Rfl:  .  clonazePAM (KLONOPIN) 0.5 MG tablet, TAKE 1 TABLET BY MOUTH EVERY 6 HOURS AS NEEDED FOR ANXIETY., Disp: 120 tablet, Rfl: 1 .  gabapentin (NEURONTIN) 300 MG capsule, Take 1 capsule (300 mg total) by mouth at bedtime. (Patient taking differently: Take 600 mg by mouth at bedtime. ), Disp: 90 capsule, Rfl: 0 .  ibuprofen (ADVIL,MOTRIN) 200 MG tablet, Take 400 mg by mouth daily as needed for headache or moderate pain., Disp: , Rfl:  .  L-Methylfolate-Algae (DEPLIN 15) 15-90.314 MG CAPS, Take 1 capsule by mouth daily., Disp: 90 capsule, Rfl: 3 .  lamoTRIgine (LAMICTAL) 150 MG tablet, TAKE 1 TABLET BY MOUTH TWICE DAILY, Disp: 180 tablet, Rfl: 0 .  levonorgestrel (MIRENA) 20 MCG/24HR IUD, 1 each by Intrauterine route once. , Disp: , Rfl:  .  Multiple Vitamin (MULTIVITAMIN) tablet, Take 1 tablet by mouth  at bedtime. , Disp: , Rfl:  .  prazosin (MINIPRESS) 2 MG capsule, TAKE 2 CAPSULES BY MOUTH AT BEDTIME. (Patient taking differently: 6 mg at bedtime. ), Disp: 180 capsule, Rfl: 0 .  propranolol (INDERAL) 40 MG tablet, TAKE 1 TABLET BY MOUTH 2 TIMES DAILY., Disp: 180 tablet, Rfl: 0 .  QUEtiapine (SEROQUEL) 200 MG tablet, TAKE ONE AND A HALF (1.5) TABLETS (300 MG TOTAL) BY MOUTH AT BEDTIME., Disp: 135 tablet, Rfl: 0 .  Topiramate ER (TROKENDI XR) 50 MG CP24, Take 50 mg by mouth daily., Disp: 30 capsule, Rfl:  0 .  metFORMIN (GLUCOPHAGE) 1000 MG tablet, Take 1 tablet (1,000 mg total) by mouth daily with breakfast., Disp: 90 tablet, Rfl: 0 .  prazosin (MINIPRESS) 1 MG capsule, TAKE 1 CAPSULE BY MOUTH AT BEDTIME. ADD TO THE 4 MG YOU'RE ALREADY TAKING TO =5 MG., Disp: 90 capsule, Rfl: 0 Medication Side Effects: none  Family Medical/ Social History: Changes? No  MENTAL HEALTH EXAM:  Blood pressure 129/83, pulse 91.There is no height or weight on file to calculate BMI.  General Appearance: Casual, Neat, Well Groomed and Obese  Eye Contact:  Good  Speech:  Clear and Coherent  Volume:  Normal  Mood:  Euthymic  Affect:  Appropriate  Thought Process:  Goal Directed and Descriptions of Associations: Intact  Orientation:  Full (Time, Place, and Person)  Thought Content: Logical   Suicidal Thoughts:  No  Homicidal Thoughts:  No  Memory:  WNL  Judgement:  Good  Insight:  Good  Psychomotor Activity:  Normal  Concentration:  Concentration: Good  Recall:  Good  Fund of Knowledge: Good  Language: Good  Assets:  Desire for Improvement  ADL's:  Intact  Cognition: WNL  Prognosis:  Good    DIAGNOSES:    ICD-10-CM   1. PTSD (post-traumatic stress disorder)  F43.10   2. Major depressive disorder, recurrent episode, moderate (HCC)  F33.1   3. Generalized anxiety disorder  F41.1   4. Insomnia, unspecified type  G47.00   5. Obstructive sleep apnea  G47.33     Receiving Psychotherapy: Yes  Heather [facility]   RECOMMENDATIONS:  I spent 25 minutes with her. We discussed each diagnosis and medication that she is on to treat them.  She is very stable right now so we decided to make no changes in medications. PDMP was reviewed. Continue Klonopin 0.5 mg every 6 hours as needed. Continue gabapentin 600 mg q. evening. Continue Lamictal 150 mg twice daily. Continue prazosin 6 mg nightly.  (When she needs prazosin filled the next time, send in 2 mg, 3 p.o. nightly.) Continue propranolol 40 mg twice  daily. Continue Seroquel 200 mg, 1.5 pills nightly. Continue Lantus 15 mg, biotin, multivitamin, vitamin D3 5000 IUs daily. Continue therapy with Heather Mask. Return in 3 months.  Donnal Moat, PA-C

## 2019-09-27 ENCOUNTER — Ambulatory Visit (INDEPENDENT_AMBULATORY_CARE_PROVIDER_SITE_OTHER): Payer: 59 | Admitting: Bariatrics

## 2019-09-27 ENCOUNTER — Encounter (INDEPENDENT_AMBULATORY_CARE_PROVIDER_SITE_OTHER): Payer: Self-pay | Admitting: Bariatrics

## 2019-09-27 ENCOUNTER — Other Ambulatory Visit: Payer: Self-pay

## 2019-09-27 VITALS — BP 129/79 | HR 98 | Temp 98.2°F | Ht 65.0 in | Wt 288.0 lb

## 2019-09-27 DIAGNOSIS — F3289 Other specified depressive episodes: Secondary | ICD-10-CM | POA: Diagnosis not present

## 2019-09-27 DIAGNOSIS — Z9189 Other specified personal risk factors, not elsewhere classified: Secondary | ICD-10-CM

## 2019-09-27 DIAGNOSIS — Z6841 Body Mass Index (BMI) 40.0 and over, adult: Secondary | ICD-10-CM

## 2019-09-27 DIAGNOSIS — R7303 Prediabetes: Secondary | ICD-10-CM | POA: Diagnosis not present

## 2019-09-27 DIAGNOSIS — F4310 Post-traumatic stress disorder, unspecified: Secondary | ICD-10-CM | POA: Diagnosis not present

## 2019-09-27 MED ORDER — TROKENDI XR 100 MG PO CP24: 100.0000 mg | ORAL_CAPSULE | Freq: Every day | ORAL | 0 refills | Status: DC

## 2019-09-27 MED ORDER — METFORMIN HCL 1000 MG PO TABS: 1000.0000 mg | ORAL_TABLET | Freq: Every day | ORAL | 0 refills | Status: DC

## 2019-09-27 MED FILL — metFORMIN HCL 1000 MG TABS: 1000 | 30 days supply | Qty: 30 | Fill #0

## 2019-09-27 MED FILL — TROKENDI XR 100 MG CAPSULE: 100 | 30 days supply | Qty: 30 | Fill #0

## 2019-10-02 ENCOUNTER — Encounter (INDEPENDENT_AMBULATORY_CARE_PROVIDER_SITE_OTHER): Payer: Self-pay | Admitting: Bariatrics

## 2019-10-02 NOTE — Progress Notes (Signed)
Chief Complaint:   OBESITY Caroline Gonzales is here to discuss her progress with her obesity treatment plan along with follow-up of her obesity related diagnoses. Caroline Gonzales is on the Category 3 Plan and states she is following her eating plan approximately 0% of the time. Haydn states she is walking 20 minutes 5 times per week.  Today's visit was #: 14 Starting weight: 278 lbs Starting date: 08/31/2016 Today's weight: 288 lbs Today's date: 09/27/2019 Total lbs lost to date: 0 Total lbs lost since last in-office visit: 0  Interim History: [name] is up 2 lbs. She has been under more stress. She continues to take Trokendi 50 mg. She states she is not doing well with her eating.  Subjective:   Prediabetes. Caroline Gonzales has a diagnosis of prediabetes based on her elevated HgA1c and was informed this puts her at greater risk of developing diabetes. She continues to work on diet and exercise to decrease her risk of diabetes. She denies nausea or hypoglycemia. She does report some edema.  Lab Results  Component Value Date   HGBA1C 5.7 (H) 09/05/2019   Lab Results  Component Value Date   INSULIN 25.4 (H) 09/05/2019   INSULIN 36.2 (H) 03/22/2019   INSULIN 21.4 10/10/2018   INSULIN 87.4 (H) 05/25/2018   INSULIN 20.3 02/10/2018   Other depression - With emotional eating. Caroline Gonzales is struggling with emotional eating and using food for comfort to the extent that it is negatively impacting her health. She has been working on behavior modification techniques to help reduce her emotional eating and has been somewhat successful. She shows no sign of suicidal or homicidal ideations. Caroline Gonzales is taking Trokendi.  At risk for hypoglycemia. Caroline Gonzales is at increased risk for hypoglycemia due to changes in diet, diagnosis of diabetes, and/or insulin use. Tuesday is not currently taking insulin.   Assessment/Plan:   Prediabetes. [name] will continue to work on weight loss, exercise, and decreasing  simple carbohydrates to help decrease the risk of diabetes. Prescription was given for metFORMIN (GLUCOPHAGE) 1000 MG tablet 1 tablet by mouth daily with breakfast #30 with 0 refills.  Other depression - With emotional eating. Behavior modification techniques were discussed today to help [name] deal with her emotional/non-hunger eating behaviors.  Orders and follow up as documented in patient record. She will continue Topiramate ER (TROKENDI XR) 100 MG CP24 1 daily (increased dose from 50 mg) #30 with 0 refills.  At risk for hypoglycemia. Caroline Gonzales was given approximately 15 minutes of counseling today regarding prevention of hypoglycemia. She was advised of symptoms of hypoglycemia. Caroline Gonzales was instructed to eat regular meals.   Class 3 severe obesity with serious comorbidity and body mass index (BMI) of 45.0 to 49.9 in adult, unspecified obesity type ([place]).   [name] is currently in the action stage of change. As such, her goal is to continue with weight loss efforts. She has agreed to the Category 3 Plan.   She will work on meal planning, intentional eating, continue Trokendi, and continue to increase her water intake.  Exercise goals: Caroline Gonzales will continue walking.  Behavioral modification strategies: increasing lean protein intake, decreasing simple carbohydrates, increasing vegetables, increasing water intake, decreasing eating out, no skipping meals, meal planning and cooking strategies and keeping healthy foods in the home.  Caroline Gonzales has agreed to follow-up with our clinic in 2 weeks. She was informed of the importance of frequent follow-up visits to maximize her success with intensive lifestyle modifications for her multiple health conditions.  Objective:   Blood pressure 129/79, pulse 98, temperature 98.2 F (36.8 C), height 5\' 5"  (1.651 m), weight 288 lb (130.6 kg), SpO2 (!) 89%. Body mass index is 47.93 kg/m.  General: Cooperative, alert, well developed, in no acute  distress. HEENT: Conjunctivae and lids unremarkable. Cardiovascular: Regular rhythm.  Lungs: Normal work of breathing. Neurologic: No focal deficits.   Lab Results  Component Value Date   CREATININE 1.02 (H) 09/05/2019   BUN 11 09/05/2019   NA 137 09/05/2019   K 4.4 09/05/2019   CL 102 09/05/2019   CO2 24 09/05/2019   Lab Results  Component Value Date   ALT 15 09/05/2019   AST 16 09/05/2019   ALKPHOS 103 09/05/2019   BILITOT 0.2 09/05/2019   Lab Results  Component Value Date   HGBA1C 5.7 (H) 09/05/2019   HGBA1C 5.8 (H) 03/22/2019   HGBA1C 5.2 10/10/2018   HGBA1C 5.3 05/25/2018   HGBA1C 5.4 02/10/2018   Lab Results  Component Value Date   INSULIN 25.4 (H) 09/05/2019   INSULIN 36.2 (H) 03/22/2019   INSULIN 21.4 10/10/2018   INSULIN 87.4 (H) 05/25/2018   INSULIN 20.3 02/10/2018   Lab Results  Component Value Date   TSH 1.620 08/31/2016   Lab Results  Component Value Date   CHOL 157 03/22/2019   HDL 41 03/22/2019   LDLCALC 70 03/22/2019   TRIG 231 (H) 03/22/2019   CHOLHDL 3.5 02/10/2018   Lab Results  Component Value Date   WBC 8.3 04/07/2019   HGB 11.7 (L) 04/07/2019   HCT 37.1 04/07/2019   MCV 91.2 04/07/2019   PLT 253 04/07/2019   Lab Results  Component Value Date   IRON 53 08/31/2016   TIBC 280 08/31/2016   FERRITIN 36 08/31/2016   Attestation Statements:   Reviewed by clinician on day of visit: allergies, medications, problem list, medical history, surgical history, family history, social history, and previous encounter notes.  Migdalia Dk, am acting as Location manager for [employer], DO   I have reviewed the above documentation for accuracy and completeness, and I agree with the above. Jearld Lesch, DO

## 2019-10-03 ENCOUNTER — Telehealth: Payer: Self-pay | Admitting: Physician Assistant

## 2019-10-03 ENCOUNTER — Other Ambulatory Visit: Payer: Self-pay

## 2019-10-03 DIAGNOSIS — H524 Presbyopia: Secondary | ICD-10-CM | POA: Diagnosis not present

## 2019-10-03 DIAGNOSIS — H04123 Dry eye syndrome of bilateral lacrimal glands: Secondary | ICD-10-CM | POA: Diagnosis not present

## 2019-10-03 DIAGNOSIS — H40013 Open angle with borderline findings, low risk, bilateral: Secondary | ICD-10-CM | POA: Diagnosis not present

## 2019-10-03 DIAGNOSIS — H43393 Other vitreous opacities, bilateral: Secondary | ICD-10-CM | POA: Diagnosis not present

## 2019-10-03 MED ORDER — GABAPENTIN 300 MG PO CAPS: 600.0000 mg | ORAL_CAPSULE | Freq: Every day | ORAL | 1 refills | Status: DC

## 2019-10-03 MED FILL — GABAPENTIN 300 MG CAPSULE: 300 | 90 days supply | Qty: 180 | Fill #0

## 2019-10-03 MED FILL — LOTEPREDNOL ETABONATE 0.5 %: 0.5 | 15 days supply | Qty: 15 | Fill #0

## 2019-10-03 NOTE — Telephone Encounter (Signed)
Noted, you are correct on that FMLA, thank you  Rx for Gabapentin submitted to [facility]

## 2019-10-03 NOTE — Telephone Encounter (Signed)
Pt called to request Rx for Neurontin 300 mg to Pharmacy. States you agreed to handle this medication as well. Also, her FMLA paperwork will need to be updated in next 6 weeks. Please advise what Pt needs to do for this paperwork to be completed return call at [phone]

## 2019-10-03 NOTE — Telephone Encounter (Signed)
Ok to send the Gabapentin.  #180 w/ Rf1  I'm pretty sure the monthly FMLA was someone else.  For this, ask Sophiana to fax [name] the paperwork, letting us know if the # of days off should be the same in the coming year or are there changes that will need to be made.  Thanks

## 2019-10-04 DIAGNOSIS — F4310 Post-traumatic stress disorder, unspecified: Secondary | ICD-10-CM | POA: Diagnosis not present

## 2019-10-04 NOTE — Telephone Encounter (Signed)
Left detailed voicemail with information on FMLA, and to call back with any further questions or concerns.  Also her Rx for Gabapentin was submitted

## 2019-10-11 DIAGNOSIS — F4310 Post-traumatic stress disorder, unspecified: Secondary | ICD-10-CM | POA: Diagnosis not present

## 2019-10-17 ENCOUNTER — Ambulatory Visit (INDEPENDENT_AMBULATORY_CARE_PROVIDER_SITE_OTHER): Payer: 59 | Admitting: Family Medicine

## 2019-10-17 ENCOUNTER — Other Ambulatory Visit: Payer: Self-pay

## 2019-10-17 ENCOUNTER — Encounter (INDEPENDENT_AMBULATORY_CARE_PROVIDER_SITE_OTHER): Payer: Self-pay | Admitting: Family Medicine

## 2019-10-17 VITALS — BP 108/70 | HR 87 | Temp 98.4°F | Ht 65.0 in | Wt 281.0 lb

## 2019-10-17 DIAGNOSIS — Z9189 Other specified personal risk factors, not elsewhere classified: Secondary | ICD-10-CM | POA: Diagnosis not present

## 2019-10-17 DIAGNOSIS — R7303 Prediabetes: Secondary | ICD-10-CM

## 2019-10-17 DIAGNOSIS — E559 Vitamin D deficiency, unspecified: Secondary | ICD-10-CM | POA: Diagnosis not present

## 2019-10-17 DIAGNOSIS — Z6841 Body Mass Index (BMI) 40.0 and over, adult: Secondary | ICD-10-CM | POA: Diagnosis not present

## 2019-10-17 MED ORDER — TROKENDI XR 100 MG PO CP24: 100.0000 mg | ORAL_CAPSULE | Freq: Every day | ORAL | 0 refills | Status: DC

## 2019-10-18 ENCOUNTER — Telehealth: Payer: Self-pay | Admitting: Physician Assistant

## 2019-10-18 DIAGNOSIS — F4310 Post-traumatic stress disorder, unspecified: Secondary | ICD-10-CM | POA: Diagnosis not present

## 2019-10-18 NOTE — Telephone Encounter (Signed)
Pt called stating she was advised by her therapist to be seen ASAP. She stated a plan is underway to help deter ER visits for her suicidal thoughts. 2/23 is the first available. She is requesting a call back.

## 2019-10-18 NOTE — Telephone Encounter (Signed)
Caroline Gonzales, if you did not already, please go ahead and make that appointment for [name] on 11/07/2019.  She will look in my chart tomorrow to confirm.  Thank you

## 2019-10-18 NOTE — Progress Notes (Signed)
Chief Complaint:   OBESITY Caroline KLEPINGER is here to discuss her progress with her obesity treatment plan along with follow-up of her obesity related diagnoses. [name] is on the Category 3 Plan and states she is following her eating plan approximately 30% of the time. Tameron states she is walking 20 minutes 5 times per week.  Today's visit was #: 59 Starting weight: 278 lbs Starting date: 08/31/2016 Today's weight: 281 lbs Today's date: 10/17/2019 Total lbs lost to date: 0 Total lbs lost since last in-office visit: 7  Interim History: Caroline Gonzales went back to school and is voicing that she has time constraints. Caroline Gonzales is trying to eat protein and vegetables and decrease indulgences. She notices significant improvement in food obsession and binge eating type behaviors.  Subjective:   Prediabetes Caroline Gonzales has a diagnosis of prediabetes based on her elevated Hgb A1c and was informed this puts her at greater risk of developing diabetes. Her last A1c was 5.7 and last insulin level was 25.4 on 09/05/19. Caroline Gonzales is still indulging in carb rich foods frequently, but this is better controlled. She continues to work on diet and exercise to decrease her risk of diabetes. She denies nausea or hypoglycemia.  Lab Results  Component Value Date   HGBA1C 5.7 (H) 09/05/2019   Lab Results  Component Value Date   INSULIN 25.4 (H) 09/05/2019   INSULIN 36.2 (H) 03/22/2019   INSULIN 21.4 10/10/2018   INSULIN 87.4 (H) 05/25/2018   INSULIN 20.3 02/10/2018    Vitamin D deficiency Caroline Gonzales's Vitamin D level was 52.0 on 09/05/19. She is on 5,000 IU vit D supplement. She admits fatigue and denies nausea, vomiting or muscle weakness.  At risk for diabetes mellitus Caroline Gonzales is at higher than average risk for developing diabetes due to her obesity and prediabetes.   Assessment/Plan:   Prediabetes Erdene will continue to work on weight loss, exercise, and decreasing simple carbohydrates to help  decrease the risk of diabetes. Caroline Gonzales will continue metformin.  Vitamin D deficiency Low Vitamin D level contributes to fatigue and are associated with obesity, breast, and colon cancer. Kadian will continue to take Vitamin D @5 ,000 IU daily and we will repeat labs in early April. She will follow-up for routine testing of Vitamin D, at least 2-3 times per year to avoid over-replacement.  At risk for diabetes mellitus Caroline Gonzales was given approximately 15 minutes of diabetes education and counseling today. We discussed intensive lifestyle modifications today with an emphasis on weight loss as well as increasing exercise and decreasing simple carbohydrates in her diet. We also reviewed medication options with an emphasis on risk versus benefit of those discussed.   Repetitive spaced learning was employed today to elicit superior memory formation and behavioral change.  Class 3 severe obesity with serious comorbidity and body mass index (BMI) of 45.0 to 49.9 in adult, unspecified obesity type [facility]) Cassia is currently in the action stage of change. As such, her goal is to continue with weight loss efforts. She has agreed to practicing portion control and making smarter food choices, such as increasing vegetables and decreasing simple carbohydrates.   Exercise goals: Indyia will continue her current exercise regimen.  Behavioral modification strategies: increasing lean protein intake, increasing vegetables, meal planning and cooking strategies, keeping healthy foods in the home and planning for success.  Caroline Gonzales agrees to continue Trokendi 100 mg PO daily #30 with no refills.  Caroline Gonzales has agreed to follow-up with our clinic in 2 weeks. She was informed  of the importance of frequent follow-up visits to maximize her success with intensive lifestyle modifications for her multiple health conditions.   Objective:   Blood pressure 108/70, pulse 87, temperature 98.4 F (36.9 C), temperature source  Oral, height 5\' 5"  (1.651 m), weight 281 lb (127.5 kg), SpO2 97 %. Body mass index is 46.76 kg/m.  General: Cooperative, alert, well developed, in no acute distress. HEENT: Conjunctivae and lids unremarkable. Cardiovascular: Regular rhythm.  Lungs: Normal work of breathing. Neurologic: No focal deficits.   Lab Results  Component Value Date   CREATININE 1.02 (H) 09/05/2019   BUN 11 09/05/2019   NA 137 09/05/2019   K 4.4 09/05/2019   CL 102 09/05/2019   CO2 24 09/05/2019   Lab Results  Component Value Date   ALT 15 09/05/2019   AST 16 09/05/2019   ALKPHOS 103 09/05/2019   BILITOT 0.2 09/05/2019   Lab Results  Component Value Date   HGBA1C 5.7 (H) 09/05/2019   HGBA1C 5.8 (H) 03/22/2019   HGBA1C 5.2 10/10/2018   HGBA1C 5.3 05/25/2018   HGBA1C 5.4 02/10/2018   Lab Results  Component Value Date   INSULIN 25.4 (H) 09/05/2019   INSULIN 36.2 (H) 03/22/2019   INSULIN 21.4 10/10/2018   INSULIN 87.4 (H) 05/25/2018   INSULIN 20.3 02/10/2018   Lab Results  Component Value Date   TSH 1.620 08/31/2016   Lab Results  Component Value Date   CHOL 157 03/22/2019   HDL 41 03/22/2019   LDLCALC 70 03/22/2019   TRIG 231 (H) 03/22/2019   CHOLHDL 3.5 02/10/2018   Lab Results  Component Value Date   WBC 8.3 04/07/2019   HGB 11.7 (L) 04/07/2019   HCT 37.1 04/07/2019   MCV 91.2 04/07/2019   PLT 253 04/07/2019   Lab Results  Component Value Date   IRON 53 08/31/2016   TIBC 280 08/31/2016   FERRITIN 36 08/31/2016    Ref. Range 09/05/2019 09:11  Vitamin D, 25-Hydroxy Latest Ref Range: 30.0 - 100.0 ng/mL 52.0    Attestation Statements:   Reviewed by clinician on day of visit: allergies, medications, problem list, medical history, surgical history, family history, social history, and previous encounter notes.  I, Doreene Nest, am acting as transcriptionist for Eber Jones, MD.  I have reviewed the above documentation for accuracy and completeness, and I agree  with the above. - Ilene Qua, MD

## 2019-10-18 NOTE — Telephone Encounter (Signed)
I called pt.  "I've had a bad week.  I've been crying more and cut my forearm, nothing too deep and I'm keeping it clean.  I saw my therapist, Nira Conn today.  We have a plan and contract for safety. My husband is going to keep my meds where I don't know where they are. I'm going to stop school, but not sure the date. My husband is hiding the shot gun shells." Her therapist recommended she go to the ER but with all these plans in place, they agreed it's ok for her not to go, but she knows to call 911 or the suicide hotline or our office if she does need immediate help.  She contracts for safety with me as well.  She will see her therapist next Wednesday but will call her tomorrow to check in. She is going to take tomorrow and Friday off.  She has enough FMLA to do that without needing a note.  She will see me in approximately 3 weeks.

## 2019-10-23 MED FILL — TROKENDI XR 100 MG CAPSULE: 100 | 30 days supply | Qty: 30 | Fill #0

## 2019-10-24 DIAGNOSIS — F4310 Post-traumatic stress disorder, unspecified: Secondary | ICD-10-CM | POA: Diagnosis not present

## 2019-10-24 DIAGNOSIS — H04123 Dry eye syndrome of bilateral lacrimal glands: Secondary | ICD-10-CM | POA: Diagnosis not present

## 2019-10-25 DIAGNOSIS — F4310 Post-traumatic stress disorder, unspecified: Secondary | ICD-10-CM | POA: Diagnosis not present

## 2019-10-31 ENCOUNTER — Ambulatory Visit (INDEPENDENT_AMBULATORY_CARE_PROVIDER_SITE_OTHER): Payer: 59 | Admitting: Family Medicine

## 2019-10-31 ENCOUNTER — Encounter (INDEPENDENT_AMBULATORY_CARE_PROVIDER_SITE_OTHER): Payer: Self-pay | Admitting: Family Medicine

## 2019-10-31 ENCOUNTER — Other Ambulatory Visit: Payer: Self-pay

## 2019-10-31 ENCOUNTER — Other Ambulatory Visit: Payer: Self-pay | Admitting: Physician Assistant

## 2019-10-31 VITALS — BP 124/80 | HR 89 | Temp 98.3°F | Ht 65.0 in | Wt 282.0 lb

## 2019-10-31 DIAGNOSIS — F319 Bipolar disorder, unspecified: Secondary | ICD-10-CM | POA: Diagnosis not present

## 2019-10-31 DIAGNOSIS — R7303 Prediabetes: Secondary | ICD-10-CM

## 2019-10-31 DIAGNOSIS — Z6841 Body Mass Index (BMI) 40.0 and over, adult: Secondary | ICD-10-CM | POA: Diagnosis not present

## 2019-10-31 MED FILL — lamoTRIgine 150 MG TABS: 150 | 90 days supply | Qty: 180 | Fill #0

## 2019-10-31 MED FILL — clonazePAM 0.5 MG TABS: 0.5 | 30 days supply | Qty: 120 | Fill #0

## 2019-10-31 NOTE — Telephone Encounter (Signed)
Next apt 02/23

## 2019-10-31 NOTE — Telephone Encounter (Signed)
Pt called & needs refill on CLONAZEPAM 0.5 MG & LAMICTAL 150 MG. Please send to Pharmacy on file.

## 2019-11-01 DIAGNOSIS — F4310 Post-traumatic stress disorder, unspecified: Secondary | ICD-10-CM | POA: Diagnosis not present

## 2019-11-06 NOTE — Progress Notes (Signed)
Chief Complaint:   OBESITY Caroline Gonzales is here to discuss her progress with her obesity treatment plan along with follow-up of her obesity related diagnoses. Caroline Gonzales is practicing portion control and making smarter food choices, such as increasing vegetables and decreasing simple carbohydrates and states she is following her eating plan approximately 25% of the time. Caroline Gonzales states she is walking 20 minutes 5 times per week.  Today's visit was #: 68 Starting weight: 278 lbs Starting date: 08/31/2016 Today's weight: 282 lbs Today's date: 10/31/2019 Total lbs lost to date: 0 Total lbs lost since last in-office visit: 0  Interim History: Caroline Gonzales voices that she has had a difficult two weeks in terms of her mental health. 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 She has done some indulgent eating, but she realizes that this was the best option she had.  Subjective:   Prediabetes Caroline Gonzales has a diagnosis of prediabetes based on her elevated Hgb A1c and was informed this puts her at greater risk of developing diabetes. She is not on metformin or GLP1 anymore. Caroline Gonzales continues to work on diet and exercise to decrease her risk of diabetes. She admits to carb cravings and indulgences.  Lab Results  Component Value Date   HGBA1C 5.7 (H) 09/05/2019   Lab Results  Component Value Date   INSULIN 25.4 (H) 09/05/2019   INSULIN 36.2 (H) 03/22/2019   INSULIN 21.4 10/10/2018   INSULIN 87.4 (H) 05/25/2018   INSULIN 20.3 02/10/2018   Bipolar 1 disorder (HCC) Caroline Gonzales is working closely with the psychiatrist, Caroline Gonzales and her therapist. She is able to recognize and control ideations she had in the past few weeks. Caroline Gonzales has an appointment with the therapist and Caroline Gonzales this week.  Assessment/Plan:   Prediabetes Caroline Gonzales will continue to work on weight loss, exercise, and decreasing simple carbohydrates to help decrease the risk of  diabetes. We will follow up labs in early March.  Bipolar 1 disorder ([place]) We will follow up at the next appointment and plan for medication.  Class 3 severe obesity with serious comorbidity and body mass index (BMI) of 45.0 to 49.9 in adult, unspecified obesity type [name]) Caroline Gonzales is currently in the action stage of change. As such, her goal is to continue with weight loss efforts. She has agreed to practicing portion control and making smarter food choices, such as increasing vegetables and decreasing simple carbohydrates.   Exercise goals: No exercise has been prescribed at this time.  Behavioral modification strategies: increasing lean protein intake, increasing vegetables, meal planning and cooking strategies, keeping healthy foods in the home and planning for success.  Caroline Gonzales has agreed to follow-up with our clinic in 2 weeks. She was informed of the importance of frequent follow-up visits to maximize her success with intensive lifestyle modifications for her multiple health conditions.   Objective:   Blood pressure 124/80, pulse 89, temperature 98.3 F (36.8 C), temperature source Oral, height 5\' 5"  (1.651 m), weight 282 lb (127.9 kg), SpO2 97 %. Body mass index is 46.93 kg/m.  General: Cooperative, alert, well developed, in no acute distress. HEENT: Conjunctivae and lids unremarkable. Cardiovascular: Regular rhythm.  Lungs: Normal work of breathing. Neurologic: No focal deficits.   Lab Results  Component Value Date   CREATININE 1.02 (H) 09/05/2019   BUN 11 09/05/2019   NA 137 09/05/2019   K 4.4 09/05/2019   CL 102 09/05/2019   CO2 24 09/05/2019  Lab Results  Component Value Date   ALT 15 09/05/2019   AST 16 09/05/2019   ALKPHOS 103 09/05/2019   BILITOT 0.2 09/05/2019   Lab Results  Component Value Date   HGBA1C 5.7 (H) 09/05/2019   HGBA1C 5.8 (H) 03/22/2019   HGBA1C 5.2 10/10/2018   HGBA1C 5.3 05/25/2018   HGBA1C 5.4 02/10/2018   Lab Results  Component  Value Date   INSULIN 25.4 (H) 09/05/2019   INSULIN 36.2 (H) 03/22/2019   INSULIN 21.4 10/10/2018   INSULIN 87.4 (H) 05/25/2018   INSULIN 20.3 02/10/2018   Lab Results  Component Value Date   TSH 1.620 08/31/2016   Lab Results  Component Value Date   CHOL 157 03/22/2019   HDL 41 03/22/2019   LDLCALC 70 03/22/2019   TRIG 231 (H) 03/22/2019   CHOLHDL 3.5 02/10/2018   Lab Results  Component Value Date   WBC 8.3 04/07/2019   HGB 11.7 (L) 04/07/2019   HCT 37.1 04/07/2019   MCV 91.2 04/07/2019   PLT 253 04/07/2019   Lab Results  Component Value Date   IRON 53 08/31/2016   TIBC 280 08/31/2016   FERRITIN 36 08/31/2016  /  Ref. Range 09/05/2019 09:11  Vitamin D, 25-Hydroxy Latest Ref Range: 30.0 - 100.0 ng/mL 52.0    Attestation Statements:   Reviewed by clinician on day of visit: allergies, medications, problem list, medical history, surgical history, family history, social history, and previous encounter notes.  Time spent on visit including pre-visit chart review and post-visit care was 18 minutes.   I, Doreene Nest, am acting as transcriptionist for Eber Jones, MD.  I have reviewed the above documentation for accuracy and completeness, and I agree with the above. - Ilene Qua, MD

## 2019-11-07 ENCOUNTER — Encounter: Payer: Self-pay | Admitting: Physician Assistant

## 2019-11-07 ENCOUNTER — Ambulatory Visit (INDEPENDENT_AMBULATORY_CARE_PROVIDER_SITE_OTHER): Payer: 59 | Admitting: Physician Assistant

## 2019-11-07 ENCOUNTER — Other Ambulatory Visit: Payer: Self-pay

## 2019-11-07 DIAGNOSIS — F4310 Post-traumatic stress disorder, unspecified: Secondary | ICD-10-CM | POA: Diagnosis not present

## 2019-11-07 DIAGNOSIS — G4733 Obstructive sleep apnea (adult) (pediatric): Secondary | ICD-10-CM

## 2019-11-07 DIAGNOSIS — G4700 Insomnia, unspecified: Secondary | ICD-10-CM | POA: Diagnosis not present

## 2019-11-07 DIAGNOSIS — F331 Major depressive disorder, recurrent, moderate: Secondary | ICD-10-CM | POA: Diagnosis not present

## 2019-11-07 DIAGNOSIS — F411 Generalized anxiety disorder: Secondary | ICD-10-CM

## 2019-11-07 MED ORDER — PRAZOSIN HCL 2 MG PO CAPS: 6.0000 mg | ORAL_CAPSULE | Freq: Every day | ORAL | 0 refills | Status: DC

## 2019-11-07 NOTE — Progress Notes (Signed)
[facility]  Patient ID: Caroline Gonzales,  MRN: [id number]  PCP: Ronnald Nian, DO  Date of Evaluation: 11/07/2019 Time spent:20 minutes  Chief Complaint:  Chief Complaint    Anxiety; Depression      HISTORY/CURRENT STATUS: HPI for follow-up after a difficult time a few weeks ago.  See phone note to 12/02/2019.  Patient states she is feeling a lot better than she did then.  She has since withdrawn from NP school.  "Sometimes it makes me feel like a failure but at the same time I know it is okay, that now is not the time."  Not nearly as depressed or stressed.  She is able to enjoy things more, she and her son took some time one afternoon to go shopping and just have fun with each other.  Her energy and motivation level are better.  Anxiety is mostly controlled.  She sleeps well most of the time.  No nightmares.  No suicidal or homicidal thoughts.  Denies dizziness, syncope, seizures, numbness, tingling, tremor, tics, unsteady gait, slurred speech, confusion. Denies muscle or joint pain, stiffness, or dystonia.  Individual Medical History/ Review of Systems: Changes? :No    Past medications for mental health diagnoses include: Prozac, Paxil, Effexor, Norpramin, Wellbutrin, doxepin, Elavil, Geodon, Abilify, Lexapro, Trileptal, Ambien, Lunesta, Sonata, trazodone, Restoril, Remeron, Seroquel, Klonopin, propranolol, Lamictal, Deplin, Sonata, Xanax made her feel horrible, Ativan didn't help  Allergies: Penicillins, Tamiflu [oseltamivir phosphate], Tamiflu [oseltamivir phosphate], Cephalosporins, and Latex  Current Medications:  Current Outpatient Medications:  .  BIOTIN PO, Take by mouth., Disp: , Rfl:  .  Cholecalciferol (VITAMIN D3) 5000 units CAPS, Take 5,000 Units by mouth at bedtime., Disp: , Rfl:  .  clonazePAM (KLONOPIN) 0.5 MG tablet, TAKE 1 TABLET BY MOUTH EVERY 6 HOURS AS NEEDED FOR ANXIETY., Disp: 120 tablet, Rfl: 1 .  gabapentin (NEURONTIN) 300 MG capsule,  Take 2 capsules (600 mg total) by mouth at bedtime., Disp: 180 capsule, Rfl: 1 .  ibuprofen (ADVIL,MOTRIN) 200 MG tablet, Take 400 mg by mouth daily as needed for headache or moderate pain., Disp: , Rfl:  .  L-Methylfolate-Algae (DEPLIN 15) 15-90.314 MG CAPS, Take 1 capsule by mouth daily., Disp: 90 capsule, Rfl: 3 .  lamoTRIgine (LAMICTAL) 150 MG tablet, TAKE 1 TABLET BY MOUTH TWICE A DAY, Disp: 180 tablet, Rfl: 0 .  levonorgestrel (MIRENA) 20 MCG/24HR IUD, 1 each by Intrauterine route once. , Disp: , Rfl:  .  Multiple Vitamin (MULTIVITAMIN) tablet, Take 1 tablet by mouth at bedtime. , Disp: , Rfl:  .  prazosin (MINIPRESS) 2 MG capsule, Take 3 capsules (6 mg total) by mouth at bedtime., Disp: 270 capsule, Rfl: 0 .  propranolol (INDERAL) 40 MG tablet, TAKE 1 TABLET BY MOUTH 2 TIMES DAILY., Disp: 180 tablet, Rfl: 0 .  QUEtiapine (SEROQUEL) 200 MG tablet, TAKE ONE AND A HALF (1.5) TABLETS (300 MG TOTAL) BY MOUTH AT BEDTIME., Disp: 135 tablet, Rfl: 0 .  Topiramate ER (TROKENDI XR) 100 MG CP24, Take 100 mg by mouth daily., Disp: 30 capsule, Rfl: 0 .  cetirizine (ZYRTEC) 10 MG tablet, Take 10 mg by mouth daily., Disp: , Rfl:  .  metFORMIN (GLUCOPHAGE) 1000 MG tablet, Take 1 tablet (1,000 mg total) by mouth daily with breakfast., Disp: 30 tablet, Rfl: 0 Medication Side Effects: none  Family Medical/ Social History: Changes? Yes she withdrew from school  [place]:  There were no vitals taken for this visit.There is no height or weight  on file to calculate BMI.  General Appearance: Casual, Neat, Well Groomed and Obese  Eye Contact:  Good  Speech:  Clear and Coherent  Volume:  Normal  Mood:  Euthymic  Affect:  Appropriate  Thought Process:  Goal Directed and Descriptions of Associations: Intact  Orientation:  Full (Time, Place, and Person)  Thought Content: Logical   Suicidal Thoughts:  No  Homicidal Thoughts:  No  Memory:  WNL  Judgement:  Good  Insight:  Good  Psychomotor  Activity:  Normal  Concentration:  Concentration: Good  Recall:  Good  Fund of Knowledge: Good  Language: Good  Assets:  Desire for Improvement  ADL's:  Intact  Cognition: WNL  Prognosis:  Good    DIAGNOSES:    ICD-10-CM   1. Major depressive disorder, recurrent episode, moderate (HCC)  F33.1   2. PTSD (post-traumatic stress disorder)  F43.10   3. Generalized anxiety disorder  F41.1   4. Insomnia, unspecified type  G47.00   5. Obstructive sleep apnea  G47.33     Receiving Psychotherapy: Yes Heather Mask   RECOMMENDATIONS:  I spent 20 minutes with her. PDMP was reviewed. I am glad to see her doing so much better! When it is time to renew FMLA: 2 episodes per month 2 days per episode , 8 hours per day. 4 Appts per month, takes 2 hours each time, between coming here and seeing her counselor. Continue Klonopin 0.5 mg 1/2-1 p.o. every 6 hours as needed. Continue gabapentin 300 mg, 2 p.o. nightly. Continue Lamictal 150 mg, 1 p.o. twice daily. Continue Deplin 15 mg daily. Continue prazosin 2 mg, 3 p.o. nightly. Continue propranolol 40 mg, 1 p.o. twice daily. Continue Seroquel 200 mg, 1.5 pills nightly. Continue therapy. Return in 3 months.  Donnal Moat, PA-C

## 2019-11-08 DIAGNOSIS — F4310 Post-traumatic stress disorder, unspecified: Secondary | ICD-10-CM | POA: Diagnosis not present

## 2019-11-09 ENCOUNTER — Encounter (INDEPENDENT_AMBULATORY_CARE_PROVIDER_SITE_OTHER): Payer: Self-pay | Admitting: Family Medicine

## 2019-11-09 ENCOUNTER — Other Ambulatory Visit (INDEPENDENT_AMBULATORY_CARE_PROVIDER_SITE_OTHER): Payer: Self-pay | Admitting: Bariatrics

## 2019-11-09 NOTE — Telephone Encounter (Signed)
Please advise 

## 2019-11-10 ENCOUNTER — Encounter (INDEPENDENT_AMBULATORY_CARE_PROVIDER_SITE_OTHER): Payer: Self-pay | Admitting: Family Medicine

## 2019-11-10 DIAGNOSIS — Z0289 Encounter for other administrative examinations: Secondary | ICD-10-CM

## 2019-11-14 MED ORDER — TROKENDI XR 100 MG PO CP24: 100.0000 mg | ORAL_CAPSULE | Freq: Every day | ORAL | 0 refills | Status: DC

## 2019-11-15 DIAGNOSIS — F4310 Post-traumatic stress disorder, unspecified: Secondary | ICD-10-CM | POA: Diagnosis not present

## 2019-11-16 ENCOUNTER — Encounter (INDEPENDENT_AMBULATORY_CARE_PROVIDER_SITE_OTHER): Payer: Self-pay | Admitting: Family Medicine

## 2019-11-16 ENCOUNTER — Ambulatory Visit (INDEPENDENT_AMBULATORY_CARE_PROVIDER_SITE_OTHER): Payer: 59 | Admitting: Family Medicine

## 2019-11-16 ENCOUNTER — Other Ambulatory Visit: Payer: Self-pay

## 2019-11-16 VITALS — BP 121/76 | HR 87 | Temp 98.2°F | Ht 65.0 in | Wt 279.0 lb

## 2019-11-16 DIAGNOSIS — E781 Pure hyperglyceridemia: Secondary | ICD-10-CM

## 2019-11-16 DIAGNOSIS — R7303 Prediabetes: Secondary | ICD-10-CM

## 2019-11-16 DIAGNOSIS — Z6841 Body Mass Index (BMI) 40.0 and over, adult: Secondary | ICD-10-CM | POA: Diagnosis not present

## 2019-11-16 MED FILL — TROKENDI XR 100 MG CAPSULE: 100 | 30 days supply | Qty: 30 | Fill #0

## 2019-11-16 NOTE — Progress Notes (Signed)
Chief Complaint:   OBESITY Caroline Gonzales is here to discuss her progress with her obesity treatment plan along with follow-up of her obesity related diagnoses. Caroline Gonzales is on practicing portion control and making smarter food choices, such as increasing vegetables and decreasing simple carbohydrates and states she is following her eating plan approximately 80% of the time. Caroline Gonzales states she is walking for 20 minutes 6 times per week.  Today's visit was #: 28 Starting weight: 278 lbs Starting date: 08/31/2016 Today's weight: 279 lbs Today's date: 11/16/2019 Total lbs lost to date: 0 Total lbs lost since last in-office visit: 3 lbs  Interim History: Caroline Gonzales's last few weeks have been slightly more relaxing without school and working at the [employer]. She started [employer] yesterday.  Trokendi is helping her control indulgent eating.  She is doing [employer] sandwich, hashbrowns and unsweet tea.  Subjective:   1. Prediabetes Caroline Gonzales has a diagnosis of prediabetes based on her elevated HgA1c and was informed this puts her at greater risk of developing diabetes. She continues to work on diet and exercise to decrease her risk of diabetes. She denies nausea or hypoglycemia.  She is doing well on metformin.  She does not take it on days at the [facility].   Lab Results  Component Value Date   HGBA1C 5.7 (H) 09/05/2019   Lab Results  Component Value Date   INSULIN 25.4 (H) 09/05/2019   INSULIN 36.2 (H) 03/22/2019   INSULIN 21.4 10/10/2018   INSULIN 87.4 (H) 05/25/2018   INSULIN 20.3 02/10/2018   2. Hypertriglyceridemia Caroline Gonzales has hypertriglyceridemia and has been trying to improve her cholesterol levels with intensive lifestyle modification including a low saturated fat diet, exercise and weight loss. She denies any chest pain, claudication or myalgias.  She is not on any medications.  Lab Results  Component Value Date   ALT 15 09/05/2019   AST 16 09/05/2019     ALKPHOS 103 09/05/2019   BILITOT 0.2 09/05/2019   Lab Results  Component Value Date   CHOL 157 03/22/2019   HDL 41 03/22/2019   LDLCALC 70 03/22/2019   TRIG 231 (H) 03/22/2019   CHOLHDL 3.5 02/10/2018   Assessment/Plan:   1. Prediabetes Caroline Gonzales will continue to work on weight loss, exercise, and decreasing simple carbohydrates to help decrease the risk of diabetes.  Will repeat labs in April.  2. Hypertriglyceridemia Cardiovascular risk and specific lipid/LDL goals reviewed.  We discussed several lifestyle modifications today and Caroline Gonzales will continue to work on diet, exercise and weight loss efforts. Orders and follow up as documented in patient record.  Will repeat labs in April.  Counseling Intensive lifestyle modifications are the first line treatment for this issue. . Dietary changes: Increase soluble fiber. Decrease simple carbohydrates. . Exercise changes: Moderate to vigorous-intensity aerobic activity 150 minutes per week if tolerated. Lipid-lowering medications: see documented in medical record.  3. Class 3 severe obesity with serious comorbidity and body mass index (BMI) of 45.0 to 49.9 in adult, unspecified obesity type Caroline Gonzales) Caroline Gonzales is currently in the action stage of change. As such, her goal is to continue with weight loss efforts. She has agreed to practicing portion control and making smarter food choices, such as increasing vegetables and decreasing simple carbohydrates.   Exercise goals: As is.  Behavioral modification strategies: increasing lean protein intake, increasing vegetables, meal planning and cooking strategies, keeping healthy foods in the home and planning for success.  [name] has agreed to follow-up with our  clinic in 2-3 weeks. She was informed of the importance of frequent follow-up visits to maximize her success with intensive lifestyle modifications for her multiple health conditions.   Objective:   Blood pressure 121/76, pulse 87,  temperature 98.2 F (36.8 C), temperature source Oral, height 5\' 5"  (1.651 m), weight 279 lb (126.6 kg), SpO2 97 %. Body mass index is 46.43 kg/m.  General: Cooperative, alert, well developed, in no acute distress. HEENT: Conjunctivae and lids unremarkable. Cardiovascular: Regular rhythm.  Lungs: Normal work of breathing. Neurologic: No focal deficits.   Lab Results  Component Value Date   CREATININE 1.02 (H) 09/05/2019   BUN 11 09/05/2019   NA 137 09/05/2019   K 4.4 09/05/2019   CL 102 09/05/2019   CO2 24 09/05/2019   Lab Results  Component Value Date   ALT 15 09/05/2019   AST 16 09/05/2019   ALKPHOS 103 09/05/2019   BILITOT 0.2 09/05/2019   Lab Results  Component Value Date   HGBA1C 5.7 (H) 09/05/2019   HGBA1C 5.8 (H) 03/22/2019   HGBA1C 5.2 10/10/2018   HGBA1C 5.3 05/25/2018   HGBA1C 5.4 02/10/2018   Lab Results  Component Value Date   INSULIN 25.4 (H) 09/05/2019   INSULIN 36.2 (H) 03/22/2019   INSULIN 21.4 10/10/2018   INSULIN 87.4 (H) 05/25/2018   INSULIN 20.3 02/10/2018   Lab Results  Component Value Date   TSH 1.620 08/31/2016   Lab Results  Component Value Date   CHOL 157 03/22/2019   HDL 41 03/22/2019   LDLCALC 70 03/22/2019   TRIG 231 (H) 03/22/2019   CHOLHDL 3.5 02/10/2018   Lab Results  Component Value Date   WBC 8.3 04/07/2019   HGB 11.7 (L) 04/07/2019   HCT 37.1 04/07/2019   MCV 91.2 04/07/2019   PLT 253 04/07/2019   Lab Results  Component Value Date   IRON 53 08/31/2016   TIBC 280 08/31/2016   FERRITIN 36 08/31/2016   Attestation Statements:   Reviewed by clinician on day of visit: allergies, medications, problem list, medical history, surgical history, family history, social history, and previous encounter notes.  Time spent on visit including pre-visit chart review and post-visit care and charting was 16 minutes.   I, Water quality scientist, CMA, am acting as transcriptionist for Coralie Common, MD.  I have reviewed the above  documentation for accuracy and completeness, and I agree with the above. - Ilene Qua, MD

## 2019-11-22 DIAGNOSIS — F4310 Post-traumatic stress disorder, unspecified: Secondary | ICD-10-CM | POA: Diagnosis not present

## 2019-11-29 DIAGNOSIS — F4310 Post-traumatic stress disorder, unspecified: Secondary | ICD-10-CM | POA: Diagnosis not present

## 2019-11-30 ENCOUNTER — Encounter (INDEPENDENT_AMBULATORY_CARE_PROVIDER_SITE_OTHER): Payer: Self-pay | Admitting: Family Medicine

## 2019-11-30 ENCOUNTER — Other Ambulatory Visit: Payer: Self-pay

## 2019-11-30 ENCOUNTER — Ambulatory Visit (INDEPENDENT_AMBULATORY_CARE_PROVIDER_SITE_OTHER): Payer: 59 | Admitting: Family Medicine

## 2019-11-30 VITALS — BP 119/76 | HR 82 | Temp 98.3°F | Ht 65.0 in | Wt 278.0 lb

## 2019-11-30 DIAGNOSIS — Z6841 Body Mass Index (BMI) 40.0 and over, adult: Secondary | ICD-10-CM | POA: Diagnosis not present

## 2019-11-30 DIAGNOSIS — E559 Vitamin D deficiency, unspecified: Secondary | ICD-10-CM | POA: Diagnosis not present

## 2019-11-30 DIAGNOSIS — R7303 Prediabetes: Secondary | ICD-10-CM | POA: Diagnosis not present

## 2019-12-01 ENCOUNTER — Other Ambulatory Visit (INDEPENDENT_AMBULATORY_CARE_PROVIDER_SITE_OTHER): Payer: Self-pay | Admitting: Family Medicine

## 2019-12-01 ENCOUNTER — Other Ambulatory Visit: Payer: Self-pay | Admitting: Physician Assistant

## 2019-12-01 DIAGNOSIS — Z6841 Body Mass Index (BMI) 40.0 and over, adult: Secondary | ICD-10-CM

## 2019-12-01 MED FILL — TROKENDI XR 100 MG CAPSULE: 100 | 30 days supply | Qty: 30 | Fill #0

## 2019-12-01 MED FILL — PRAZOSIN 2 MG CAPSULE: 2 | 90 days supply | Qty: 270 | Fill #0

## 2019-12-01 MED FILL — clonazePAM 0.5 MG TABS: 0.5 | 30 days supply | Qty: 120 | Fill #1

## 2019-12-01 NOTE — Progress Notes (Signed)
Chief Complaint:   OBESITY Caroline Gonzales is here to discuss her progress with her obesity treatment plan along with follow-up of her obesity related diagnoses. Caroline Gonzales is on practicing portion control and making smarter food choices, such as increasing vegetables and decreasing simple carbohydrates and states she is following her eating plan approximately 85% of the time. Caroline Gonzales states she is walking for 20 minutes 5 times per week.  Today's visit was #: 65 Starting weight: 278 lbs Starting date: 08/31/2016 Today's weight: 278 lbs Today's date: 11/30/2019 Total lbs lost to date: 0 Total lbs lost since last in-office visit: 1  Interim History: Caroline Gonzales voices the last few weeks have been fairly on track with her goal that she set for herself. She is limiting carbohydrates with few indulgences. 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 She is doing well on [place] and she has been able to resist temptations. She isn't drinking water pretty much at all, all day.  Subjective:   1. Pre-diabetes Caroline Gonzales Hgb A1c was 5.7 and insulin of 25.4. she is not on metformin.  2. Vitamin D deficiency Caroline Gonzales last Vit D level was 52.0. She is on 5,000 IU daily OTC Vit D. She denies nausea, vomiting, or muscle weakness, but she notes fatigue.  Assessment/Plan:   1. Pre-diabetes Caroline Gonzales will continue to work on weight loss, exercise, and decreasing simple carbohydrates to help decrease the risk of diabetes. We will repeat labs in early May.  2. Vitamin D deficiency Low Vitamin D level contributes to fatigue and are associated with obesity, breast, and colon cancer. Caroline Gonzales agreed to continue taking OTC Vitamin D and will follow-up for routine testing of Vitamin D, at least 2-3 times per year to avoid over-replacement.  3. Class 3 severe obesity with serious comorbidity and body mass index (BMI) of 45.0 to 49.9 in adult, unspecified obesity type  [facility]) Caroline Gonzales is currently in the action stage of change. As such, her goal is to continue with weight loss efforts. She has agreed to practicing portion control and making smarter food choices, such as increasing vegetables and decreasing simple carbohydrates.   Caroline Gonzales goal is to increase water consumption and limit ice cream to sorbet cup.   Exercise goals: No exercise has been prescribed at this time.  Behavioral modification strategies: increasing lean protein intake, increasing vegetables, meal planning and cooking strategies, keeping healthy foods in the home and planning for success.  Caroline Gonzales has agreed to follow-up with our clinic in 2 to 3 weeks. She was informed of the importance of frequent follow-up visits to maximize her success with intensive lifestyle modifications for her multiple health conditions.   Objective:   Blood pressure 119/76, pulse 82, temperature 98.3 F (36.8 C), temperature source Oral, height 5\' 5"  (1.651 m), weight 278 lb (126.1 kg), SpO2 99 %. Body mass index is 46.26 kg/m.  General: Cooperative, alert, well developed, in no acute distress. HEENT: Conjunctivae and lids unremarkable. Cardiovascular: Regular rhythm.  Lungs: Normal work of breathing. Neurologic: No focal deficits.   Lab Results  Component Value Date   CREATININE 1.02 (H) 09/05/2019   BUN 11 09/05/2019   NA 137 09/05/2019   K 4.4 09/05/2019   CL 102 09/05/2019   CO2 24 09/05/2019   Lab Results  Component Value Date   ALT 15 09/05/2019   AST 16 09/05/2019   ALKPHOS 103 09/05/2019   BILITOT 0.2 09/05/2019   Lab Results  Component Value Date  HGBA1C 5.7 (H) 09/05/2019   HGBA1C 5.8 (H) 03/22/2019   HGBA1C 5.2 10/10/2018   HGBA1C 5.3 05/25/2018   HGBA1C 5.4 02/10/2018   Lab Results  Component Value Date   INSULIN 25.4 (H) 09/05/2019   INSULIN 36.2 (H) 03/22/2019   INSULIN 21.4 10/10/2018   INSULIN 87.4 (H) 05/25/2018   INSULIN 20.3 02/10/2018   Lab Results   Component Value Date   TSH 1.620 08/31/2016   Lab Results  Component Value Date   CHOL 157 03/22/2019   HDL 41 03/22/2019   LDLCALC 70 03/22/2019   TRIG 231 (H) 03/22/2019   CHOLHDL 3.5 02/10/2018   Lab Results  Component Value Date   WBC 8.3 04/07/2019   HGB 11.7 (L) 04/07/2019   HCT 37.1 04/07/2019   MCV 91.2 04/07/2019   PLT 253 04/07/2019   Lab Results  Component Value Date   IRON 53 08/31/2016   TIBC 280 08/31/2016   FERRITIN 36 08/31/2016   Attestation Statements:   Reviewed by clinician on day of visit: allergies, medications, problem list, medical history, surgical history, family history, social history, and previous encounter notes.  Time spent on visit including pre-visit chart review and post-visit care and charting was 15 minutes.    I, Trixie Dredge, am acting as transcriptionist for Ilene Qua, MD.  I have reviewed the above documentation for accuracy and completeness, and I agree with the above. - Ilene Qua, MD

## 2019-12-01 NOTE — Telephone Encounter (Signed)
Pt left message stating refill for Prazosin (minipress) should be 2 mg 3 at bedtime.Dose change

## 2019-12-05 DIAGNOSIS — H04123 Dry eye syndrome of bilateral lacrimal glands: Secondary | ICD-10-CM | POA: Diagnosis not present

## 2019-12-06 DIAGNOSIS — F4310 Post-traumatic stress disorder, unspecified: Secondary | ICD-10-CM | POA: Diagnosis not present

## 2019-12-13 DIAGNOSIS — F4310 Post-traumatic stress disorder, unspecified: Secondary | ICD-10-CM | POA: Diagnosis not present

## 2019-12-15 ENCOUNTER — Other Ambulatory Visit: Payer: Self-pay | Admitting: Physician Assistant

## 2019-12-18 MED FILL — QUETIAPINE FUMARATE 200 MG: 200 | 90 days supply | Qty: 135 | Fill #0

## 2019-12-19 MED FILL — RESTASIS 0.05% EYE EMULSION: 0.05 | 90 days supply | Qty: 180 | Fill #0

## 2019-12-20 ENCOUNTER — Other Ambulatory Visit: Payer: Self-pay | Admitting: Physician Assistant

## 2019-12-20 MED FILL — PROPRANOLOL 40 MG TABLET: 40 | 90 days supply | Qty: 180 | Fill #0

## 2019-12-25 ENCOUNTER — Ambulatory Visit: Payer: 59 | Admitting: Physician Assistant

## 2019-12-26 ENCOUNTER — Encounter (INDEPENDENT_AMBULATORY_CARE_PROVIDER_SITE_OTHER): Payer: Self-pay | Admitting: Family Medicine

## 2019-12-26 ENCOUNTER — Ambulatory Visit (INDEPENDENT_AMBULATORY_CARE_PROVIDER_SITE_OTHER): Payer: 59 | Admitting: Family Medicine

## 2019-12-26 ENCOUNTER — Other Ambulatory Visit: Payer: Self-pay

## 2019-12-26 VITALS — BP 108/69 | HR 82 | Temp 98.2°F | Ht 65.0 in | Wt 279.0 lb

## 2019-12-26 DIAGNOSIS — Z6841 Body Mass Index (BMI) 40.0 and over, adult: Secondary | ICD-10-CM | POA: Diagnosis not present

## 2019-12-26 DIAGNOSIS — E559 Vitamin D deficiency, unspecified: Secondary | ICD-10-CM | POA: Diagnosis not present

## 2019-12-26 DIAGNOSIS — Z9189 Other specified personal risk factors, not elsewhere classified: Secondary | ICD-10-CM

## 2019-12-26 DIAGNOSIS — R7303 Prediabetes: Secondary | ICD-10-CM | POA: Diagnosis not present

## 2019-12-26 MED ORDER — METFORMIN HCL 1000 MG PO TABS: 1000.0000 mg | ORAL_TABLET | Freq: Every day | ORAL | 0 refills | Status: DC

## 2019-12-26 MED ORDER — TROKENDI XR 100 MG PO CP24: 100.0000 mg | ORAL_CAPSULE | Freq: Every day | ORAL | 0 refills | Status: DC

## 2019-12-26 MED FILL — metFORMIN HCL 1000 MG TABS: 1000 | 30 days supply | Qty: 30 | Fill #0

## 2019-12-26 MED FILL — TROKENDI XR 100 MG CAPSULE: 100 | 30 days supply | Qty: 30 | Fill #0

## 2019-12-26 NOTE — Progress Notes (Signed)
Chief Complaint:   OBESITY Caroline Gonzales is here to discuss her progress with her obesity treatment plan along with follow-up of her obesity related diagnoses. Caroline Gonzales is on practicing portion control and making smarter food choices, such as increasing vegetables and decreasing simple carbohydrates and states she is following her eating plan approximately 75% of the time. Caroline Gonzales states she is walking for 20 minutes 5 times per week, and walked for 8 hours 1 day.  Today's visit was #: 57 Starting weight: 278 lbs Starting date: 08/30/2016 Today's weight: 279 lbs Today's date: 12/26/2019 Total lbs lost to date: 0 Total lbs lost since last in-office visit: 0  Interim History: Caroline Gonzales forgot half the time to drink all the water she wanted to. She does mention she started doing [employer], but she hasn't been getting the healthiest options. She does mention she hasn't had as much indulgent eating (not even having much cravings). 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  Subjective:   1. Pre-diabetes Caroline Gonzales's last A1c was 5.7 and insulin 25.4. She denies GI side effects of metformin.  2. Vitamin D deficiency Caroline Gonzales denies nausea, vomiting, or muscle weakness, but notes fatigue. She is on OTC Vit D 5,000 IU daily.  3. At risk for osteoporosis Caroline Gonzales is at higher risk of osteopenia and osteoporosis due to Vitamin D deficiency.   Assessment/Plan:   1. Pre-diabetes Cher will continue to work on weight loss, exercise, and decreasing simple carbohydrates to help decrease the risk of diabetes. We will refill metformin for 1 month.  - metFORMIN (GLUCOPHAGE) 1000 MG tablet; Take 1 tablet (1,000 mg total) by mouth daily with breakfast.  Dispense: 30 tablet; Refill: 0  2. Vitamin D deficiency Low Vitamin D level contributes to fatigue and are associated with obesity, breast, and colon cancer. Caroline Gonzales agreed to continue daily OTC Vit D supplementation. She will follow-up for routine testing of  Vitamin D, at least 2-3 times per year to avoid over-replacement.  3. At risk for osteoporosis Caroline Gonzales was given approximately 15 minutes of osteoporosis prevention counseling today. Caroline Gonzales is at risk for osteopenia and osteoporosis due to her Vitamin D deficiency. She was encouraged to take her Vitamin D and follow her higher calcium diet and increase strengthening exercise to help strengthen her bones and decrease her risk of osteopenia and osteoporosis.  Repetitive spaced learning was employed today to elicit superior memory formation and behavioral change.  4. Class 3 severe obesity with serious comorbidity and body mass index (BMI) of 45.0 to 49.9 in adult, unspecified obesity type Caroline Gonzales) Caroline Gonzales is currently in the action stage of change. As such, her goal is to continue with weight loss efforts. She has agreed to practicing portion control and making smarter food choices, such as increasing vegetables and decreasing simple carbohydrates.   We will refill Topamax 100 mg PO daily for 1 month.  Exercise goals: As is.  Behavioral modification strategies: increasing lean protein intake, increasing vegetables, meal planning and cooking strategies, keeping healthy foods in the home and planning for success.  Caroline Gonzales has agreed to follow-up with our clinic in 2 to 3 weeks. She was informed of the importance of frequent follow-up visits to maximize her success with intensive lifestyle modifications for her multiple health conditions.   Objective:   Blood pressure 108/69, pulse 82, temperature 98.2 F (36.8 C), temperature source Oral, height 5\' 5"  (1.651 m), weight 279 lb (126.6 kg), SpO2 98 %. Body mass index is 46.43 kg/m.  General: Cooperative, alert, well  developed, in no acute distress. HEENT: Conjunctivae and lids unremarkable. Cardiovascular: Regular rhythm.  Lungs: Normal work of breathing. Neurologic: No focal deficits.   Lab Results  Component Value Date   CREATININE 1.02 (H)  09/05/2019   BUN 11 09/05/2019   NA 137 09/05/2019   K 4.4 09/05/2019   CL 102 09/05/2019   CO2 24 09/05/2019   Lab Results  Component Value Date   ALT 15 09/05/2019   AST 16 09/05/2019   ALKPHOS 103 09/05/2019   BILITOT 0.2 09/05/2019   Lab Results  Component Value Date   HGBA1C 5.7 (H) 09/05/2019   HGBA1C 5.8 (H) 03/22/2019   HGBA1C 5.2 10/10/2018   HGBA1C 5.3 05/25/2018   HGBA1C 5.4 02/10/2018   Lab Results  Component Value Date   INSULIN 25.4 (H) 09/05/2019   INSULIN 36.2 (H) 03/22/2019   INSULIN 21.4 10/10/2018   INSULIN 87.4 (H) 05/25/2018   INSULIN 20.3 02/10/2018   Lab Results  Component Value Date   TSH 1.620 08/31/2016   Lab Results  Component Value Date   CHOL 157 03/22/2019   HDL 41 03/22/2019   LDLCALC 70 03/22/2019   TRIG 231 (H) 03/22/2019   CHOLHDL 3.5 02/10/2018   Lab Results  Component Value Date   WBC 8.3 04/07/2019   HGB 11.7 (L) 04/07/2019   HCT 37.1 04/07/2019   MCV 91.2 04/07/2019   PLT 253 04/07/2019   Lab Results  Component Value Date   IRON 53 08/31/2016   TIBC 280 08/31/2016   FERRITIN 36 08/31/2016   Attestation Statements:   Reviewed by clinician on day of visit: allergies, medications, problem list, medical history, surgical history, family history, social history, and previous encounter notes.   I, Trixie Dredge, am acting as transcriptionist for April Manson, MD.  I have reviewed the above documentation for accuracy and completeness, and I agree with the above. - Ilene Qua, MD

## 2019-12-27 DIAGNOSIS — F4310 Post-traumatic stress disorder, unspecified: Secondary | ICD-10-CM | POA: Diagnosis not present

## 2020-01-01 ENCOUNTER — Other Ambulatory Visit: Payer: Self-pay | Admitting: Physician Assistant

## 2020-01-01 MED FILL — GABAPENTIN 300 MG CAPSULE: 300 | 90 days supply | Qty: 180 | Fill #1

## 2020-01-01 MED FILL — clonazePAM 0.5 MG TABS: 0.5 | 30 days supply | Qty: 120 | Fill #0

## 2020-01-01 NOTE — Telephone Encounter (Signed)
Next apt 05/25

## 2020-01-03 DIAGNOSIS — F4310 Post-traumatic stress disorder, unspecified: Secondary | ICD-10-CM | POA: Diagnosis not present

## 2020-01-05 MED FILL — LOTEPREDNOL ETABONATE 0.5 %: 0.5 | 25 days supply | Qty: 5 | Fill #0

## 2020-01-10 DIAGNOSIS — F4310 Post-traumatic stress disorder, unspecified: Secondary | ICD-10-CM | POA: Diagnosis not present

## 2020-01-17 DIAGNOSIS — F4310 Post-traumatic stress disorder, unspecified: Secondary | ICD-10-CM | POA: Diagnosis not present

## 2020-01-18 ENCOUNTER — Encounter (INDEPENDENT_AMBULATORY_CARE_PROVIDER_SITE_OTHER): Payer: Self-pay | Admitting: Family Medicine

## 2020-01-18 ENCOUNTER — Other Ambulatory Visit: Payer: Self-pay

## 2020-01-18 ENCOUNTER — Ambulatory Visit (INDEPENDENT_AMBULATORY_CARE_PROVIDER_SITE_OTHER): Payer: 59 | Admitting: Family Medicine

## 2020-01-18 ENCOUNTER — Other Ambulatory Visit (INDEPENDENT_AMBULATORY_CARE_PROVIDER_SITE_OTHER): Payer: Self-pay | Admitting: Family Medicine

## 2020-01-18 VITALS — BP 109/76 | HR 89 | Temp 97.5°F | Ht 65.0 in | Wt 280.0 lb

## 2020-01-18 DIAGNOSIS — Z6841 Body Mass Index (BMI) 40.0 and over, adult: Secondary | ICD-10-CM

## 2020-01-18 DIAGNOSIS — Z9189 Other specified personal risk factors, not elsewhere classified: Secondary | ICD-10-CM | POA: Diagnosis not present

## 2020-01-18 DIAGNOSIS — R7303 Prediabetes: Secondary | ICD-10-CM | POA: Diagnosis not present

## 2020-01-18 DIAGNOSIS — E559 Vitamin D deficiency, unspecified: Secondary | ICD-10-CM | POA: Diagnosis not present

## 2020-01-18 MED ORDER — METFORMIN HCL 1000 MG PO TABS: 1000.0000 mg | ORAL_TABLET | Freq: Every day | ORAL | 5 refills | Status: DC

## 2020-01-18 MED ORDER — TROKENDI XR 100 MG PO CP24: 100.0000 mg | ORAL_CAPSULE | Freq: Every day | ORAL | 5 refills | Status: DC

## 2020-01-18 NOTE — Progress Notes (Signed)
Chief Complaint:   OBESITY Caroline Gonzales is here to discuss her progress with her obesity treatment plan along with follow-up of her obesity related diagnoses. [name] is on practicing portion control and making smarter food choices, such as increasing vegetables and decreasing simple carbohydrates and states she is following her eating plan approximately 50% of the time. [name] states she is walking for 20 minutes 5 times per week and 80 minutes 1 time per week.  Today's visit was #: 16 Starting weight: 278 lbs Starting date: 08/30/2016 Today's weight: 280 lbs Today's date: 01/18/2020 Total lbs lost to date: 0 Total lbs lost since last in-office visit: 0  Interim History: [name] says that the last few weeks have been much of the same.  She is feeling very defeated and guilty coming into the clinic due to not feeling she has made good nutritional choices.  She has lots of emotions with personal issues and familial stressors.  Subjective:   1. Prediabetes [name] has a diagnosis of prediabetes based on her elevated HgA1c and was informed this puts her at greater risk of developing diabetes. She continues to work on diet and exercise to decrease her risk of diabetes. She denies nausea or hypoglycemia.  She is taking metformin 1000 mg daily.  Lab Results  Component Value Date   HGBA1C 5.7 (H) 09/05/2019   Lab Results  Component Value Date   INSULIN 25.4 (H) 09/05/2019   INSULIN 36.2 (H) 03/22/2019   INSULIN 21.4 10/10/2018   INSULIN 87.4 (H) 05/25/2018   INSULIN 20.3 02/10/2018   2. Vitamin D deficiency [name]'s Vitamin D level was 52.0 on 09/05/2019. She is currently taking OTC vitamin D 5000 IU each day. She denies nausea, vomiting or muscle weakness.  She endorses fatigue.  3. At risk for diabetes mellitus [name] is at higher than average risk for developing diabetes due to her obesity.   Assessment/Plan:   1. Prediabetes [name] will continue to work on weight loss,  exercise, and decreasing simple carbohydrates to help decrease the risk of diabetes.  - metFORMIN (GLUCOPHAGE) 1000 MG tablet; Take 1 tablet (1,000 mg total) by mouth daily with breakfast.  Dispense: 30 tablet; Refill: 5  2. Vitamin D deficiency Low Vitamin D level contributes to fatigue and are associated with obesity, breast, and colon cancer. She agrees to continue to take prescription Vitamin D @50 ,000 IU every week and will follow-up for routine testing of Vitamin D, at least 2-3 times per year to avoid over-replacement.  3. At risk for diabetes mellitus Life was given approximately 15 minutes of diabetes education and counseling today. We discussed intensive lifestyle modifications today with an emphasis on weight loss as well as increasing exercise and decreasing simple carbohydrates in her diet. We also reviewed medication options with an emphasis on risk versus benefit of those discussed.   Repetitive spaced learning was employed today to elicit superior memory formation and behavioral change.  4. Class 3 severe obesity with serious comorbidity and body mass index (BMI) of 45.0 to 49.9 in adult, unspecified obesity type (HCC) - Topiramate ER (TROKENDI XR) 100 MG CP24; Take 100 mg by mouth daily.  Dispense: 30 capsule; Refill: 5  [name] is currently in the action stage of change. As such, her goal is to continue with weight loss efforts. She has agreed to practicing portion control and making smarter food choices, such as increasing vegetables and decreasing simple carbohydrates.   Exercise goals: No exercise has been prescribed at this time.  Behavioral modification strategies: emotional eating strategies, avoiding temptations and planning for success.  [name] has agreed to follow-up with our clinic as needed. She was informed of the importance of frequent follow-up visits to maximize her success with intensive lifestyle modifications for her multiple health conditions.    Objective:   Blood pressure 109/76, pulse 89, temperature (!) 97.5 F (36.4 C), temperature source Oral, height 5\' 5"  (1.651 m), weight 280 lb (127 kg), SpO2 98 %. Body mass index is 46.59 kg/m.  General: Cooperative, alert, well developed, in no acute distress. HEENT: Conjunctivae and lids unremarkable. Cardiovascular: Regular rhythm.  Lungs: Normal work of breathing. Neurologic: No focal deficits.   Lab Results  Component Value Date   CREATININE 1.02 (H) 09/05/2019   BUN 11 09/05/2019   NA 137 09/05/2019   K 4.4 09/05/2019   CL 102 09/05/2019   CO2 24 09/05/2019   Lab Results  Component Value Date   ALT 15 09/05/2019   AST 16 09/05/2019   ALKPHOS 103 09/05/2019   BILITOT 0.2 09/05/2019   Lab Results  Component Value Date   HGBA1C 5.7 (H) 09/05/2019   HGBA1C 5.8 (H) 03/22/2019   HGBA1C 5.2 10/10/2018   HGBA1C 5.3 05/25/2018   HGBA1C 5.4 02/10/2018   Lab Results  Component Value Date   INSULIN 25.4 (H) 09/05/2019   INSULIN 36.2 (H) 03/22/2019   INSULIN 21.4 10/10/2018   INSULIN 87.4 (H) 05/25/2018   INSULIN 20.3 02/10/2018   Lab Results  Component Value Date   TSH 1.620 08/31/2016   Lab Results  Component Value Date   CHOL 157 03/22/2019   HDL 41 03/22/2019   LDLCALC 70 03/22/2019   TRIG 231 (H) 03/22/2019   CHOLHDL 3.5 02/10/2018   Lab Results  Component Value Date   WBC 8.3 04/07/2019   HGB 11.7 (L) 04/07/2019   HCT 37.1 04/07/2019   MCV 91.2 04/07/2019   PLT 253 04/07/2019   Lab Results  Component Value Date   IRON 53 08/31/2016   TIBC 280 08/31/2016   FERRITIN 36 08/31/2016   Attestation Statements:   Reviewed by clinician on day of visit: allergies, medications, problem list, medical history, surgical history, family history, social history, and previous encounter notes.  I, Water quality scientist, CMA, am acting as transcriptionist for Coralie Common, MD.  I have reviewed the above documentation for accuracy and completeness, and I agree  with the above. - Jinny Blossom, MD

## 2020-01-19 MED FILL — metFORMIN HCL 1000 MG TABS: 1000 | 30 days supply | Qty: 30 | Fill #0

## 2020-01-19 MED FILL — TROKENDI XR 100 MG CAPSULE: 100 | 30 days supply | Qty: 30 | Fill #0

## 2020-01-24 DIAGNOSIS — F4310 Post-traumatic stress disorder, unspecified: Secondary | ICD-10-CM | POA: Diagnosis not present

## 2020-01-29 ENCOUNTER — Other Ambulatory Visit: Payer: Self-pay | Admitting: Physician Assistant

## 2020-01-29 MED FILL — clonazePAM 0.5 MG TABS: 0.5 | 30 days supply | Qty: 120 | Fill #1

## 2020-01-30 MED FILL — lamoTRIgine 150 MG TABS: 150 | 90 days supply | Qty: 180 | Fill #0

## 2020-01-31 DIAGNOSIS — F4310 Post-traumatic stress disorder, unspecified: Secondary | ICD-10-CM | POA: Diagnosis not present

## 2020-02-04 IMAGING — CR DG CHEST 2V
2 series · 2 of 2 positions shown · non-contrast
Comparison: 06/19/2016.

CLINICAL DATA: Mid LEFT-sided chest pain.  Nonsmoker.

EXAM:
CHEST - 2 VIEW

[w chest pa]
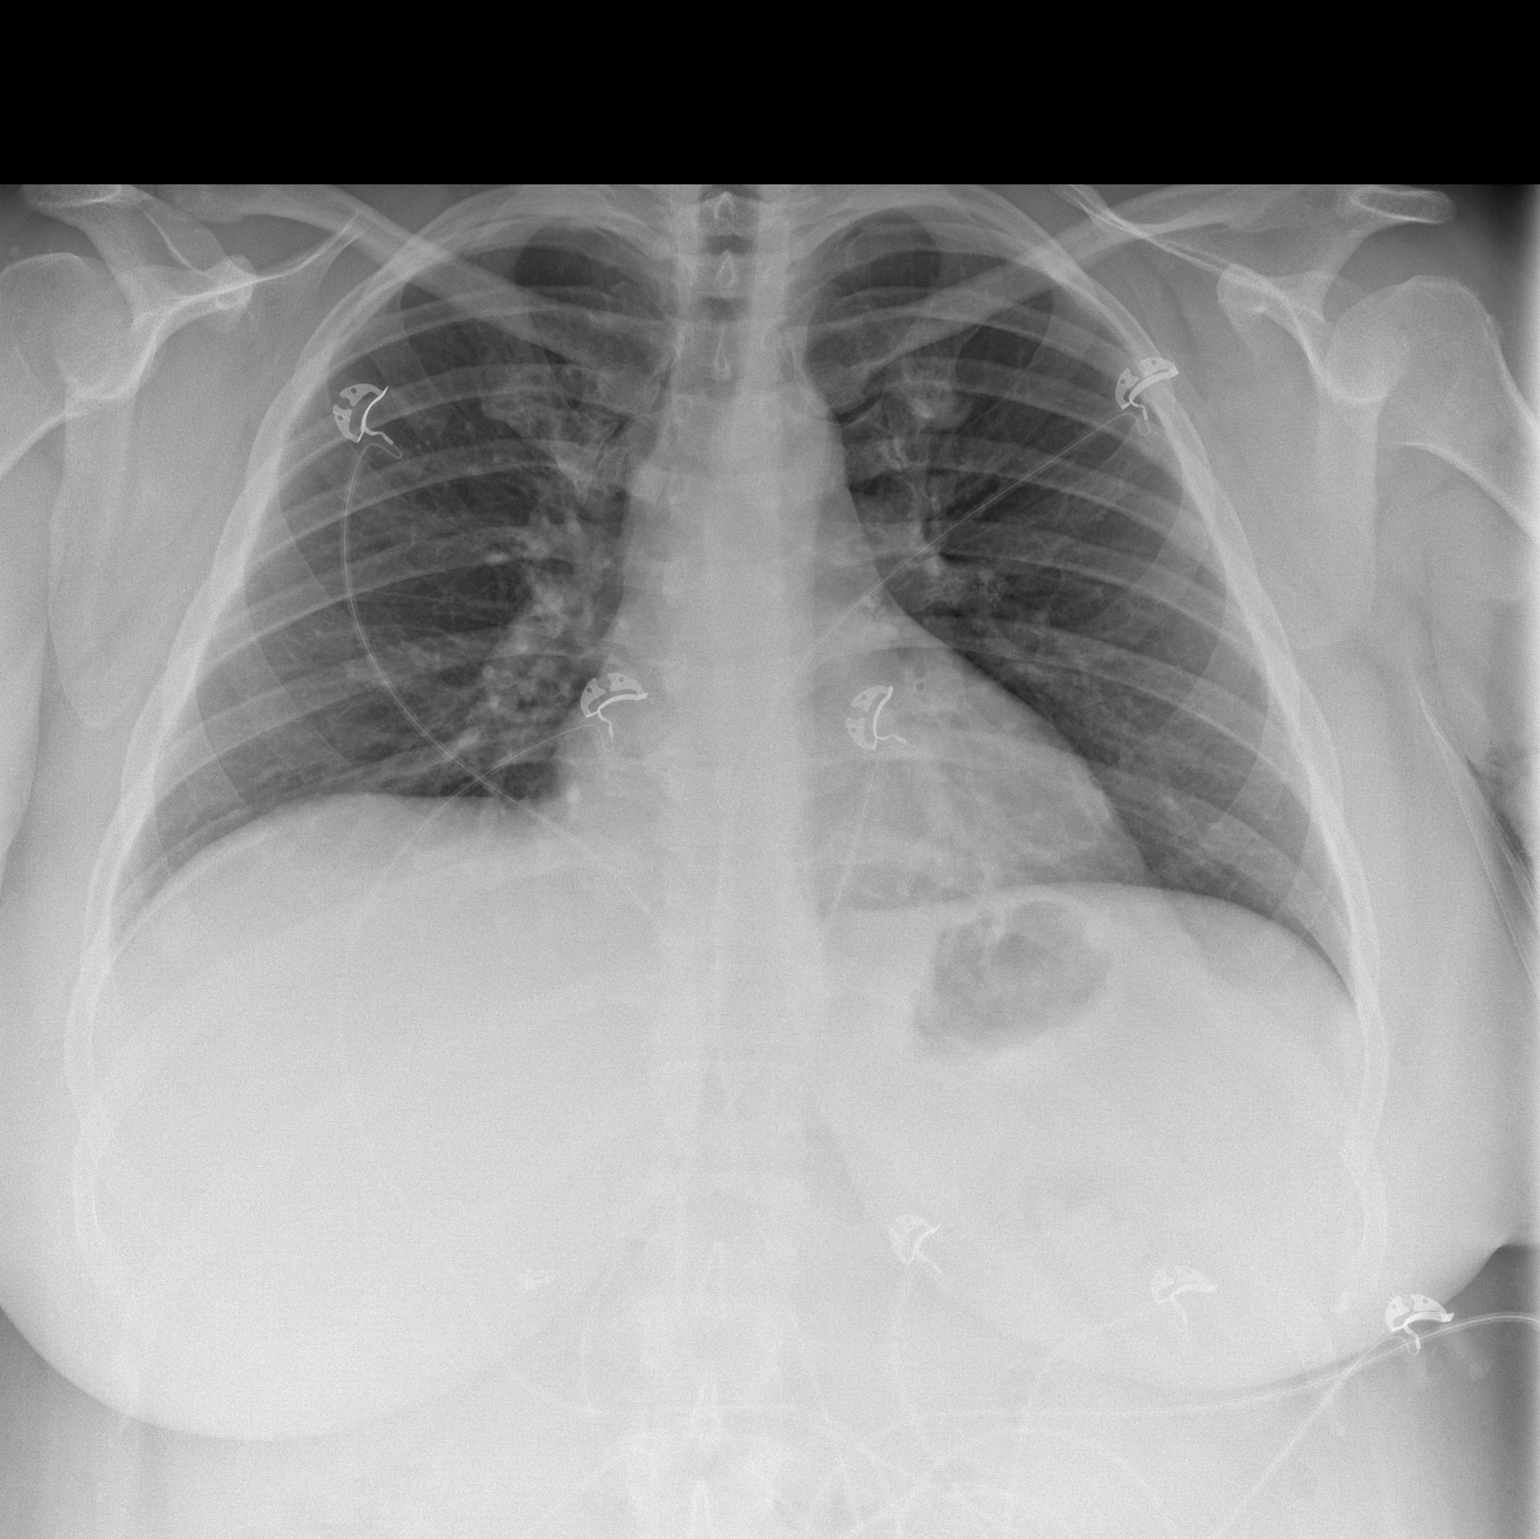

[w chest lat]
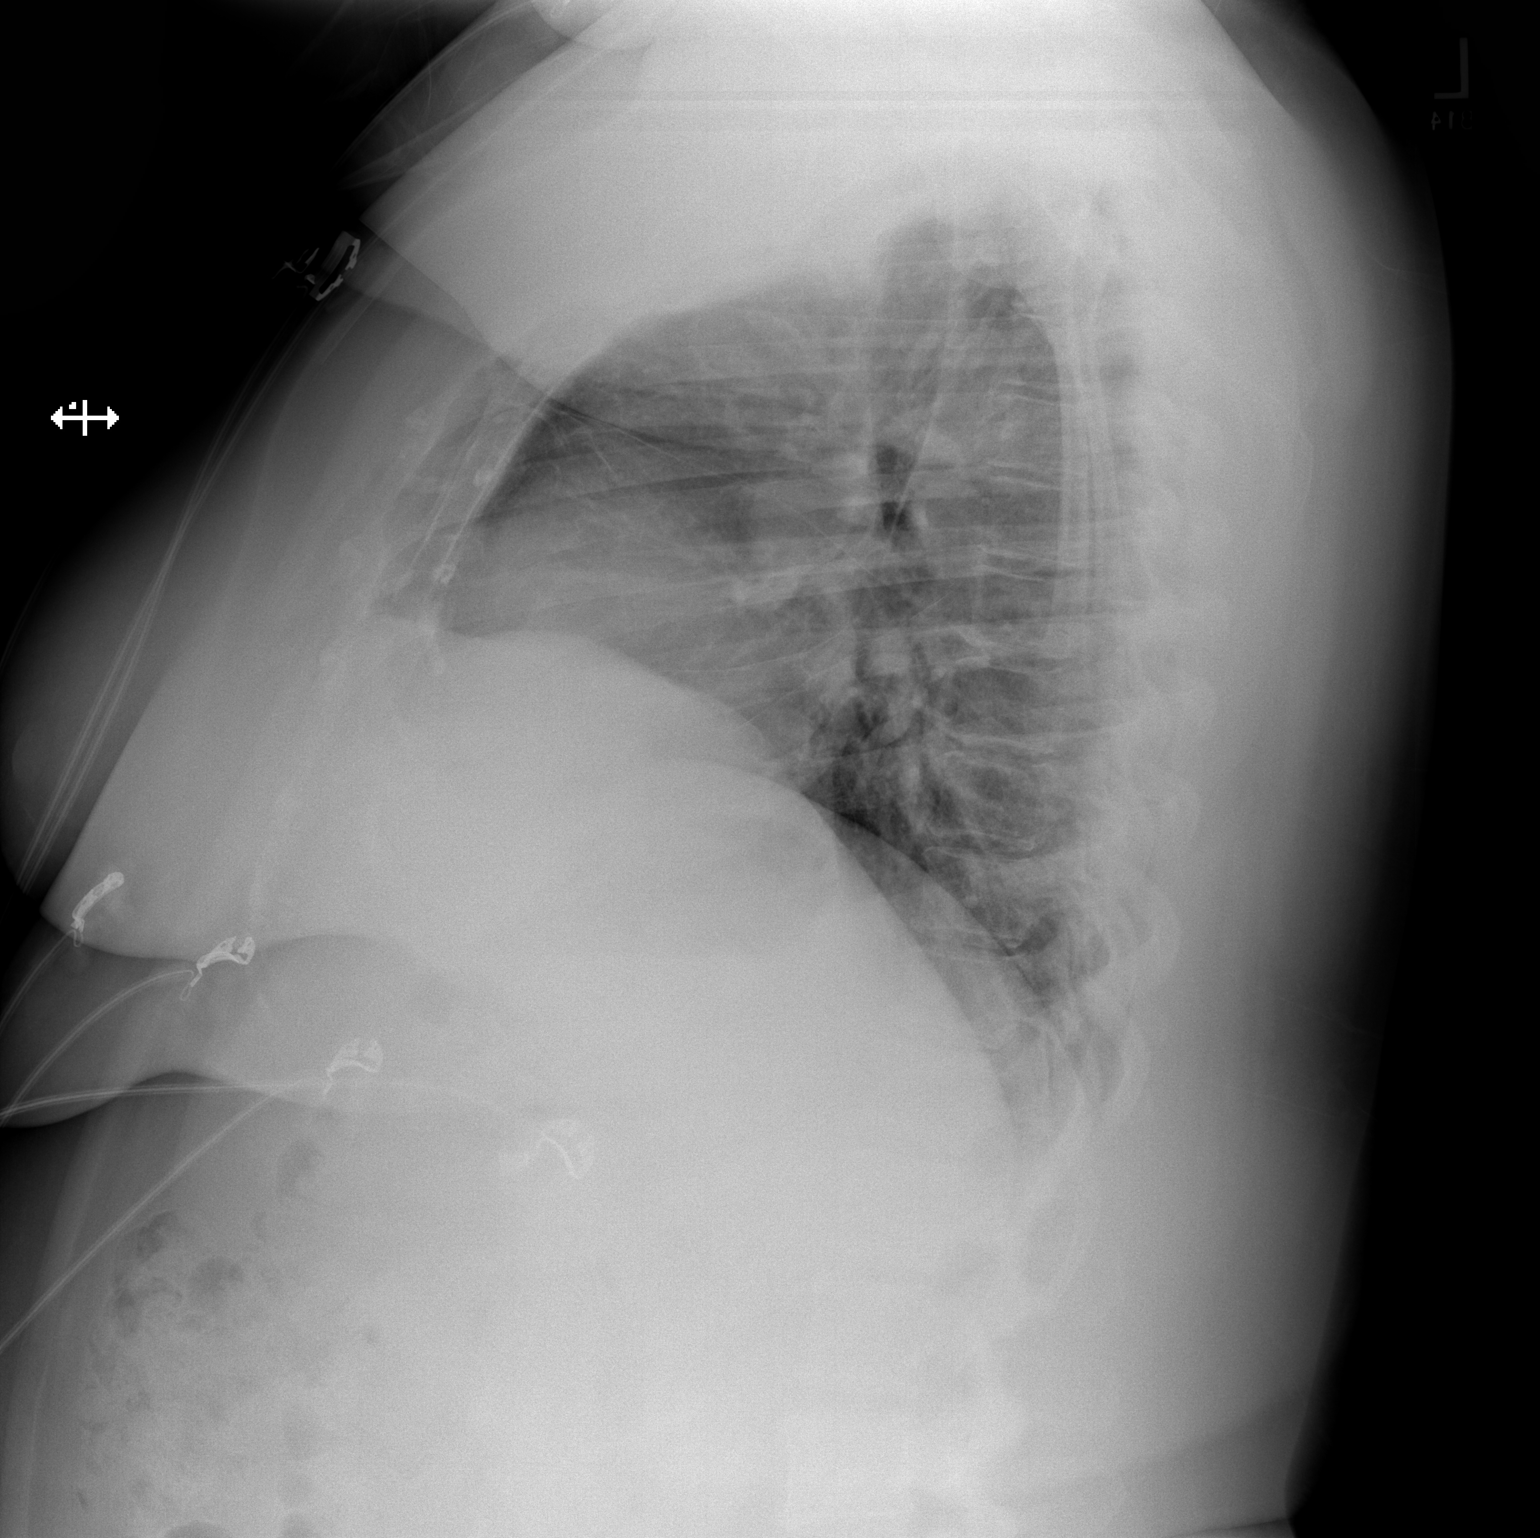

[2 of 2 positions shown; findings below may reference images not displayed]

FINDINGS: Normal heart size.  Clear lung fields.  No bony abnormality.
IMPRESSION: No active cardiopulmonary disease.  Stable appearance from priors.

## 2020-02-06 ENCOUNTER — Other Ambulatory Visit: Payer: Self-pay

## 2020-02-06 ENCOUNTER — Ambulatory Visit (INDEPENDENT_AMBULATORY_CARE_PROVIDER_SITE_OTHER): Payer: 59 | Admitting: Physician Assistant

## 2020-02-06 ENCOUNTER — Encounter: Payer: Self-pay | Admitting: Physician Assistant

## 2020-02-06 DIAGNOSIS — G4700 Insomnia, unspecified: Secondary | ICD-10-CM

## 2020-02-06 DIAGNOSIS — F515 Nightmare disorder: Secondary | ICD-10-CM

## 2020-02-06 DIAGNOSIS — F411 Generalized anxiety disorder: Secondary | ICD-10-CM

## 2020-02-06 DIAGNOSIS — F4310 Post-traumatic stress disorder, unspecified: Secondary | ICD-10-CM | POA: Diagnosis not present

## 2020-02-06 DIAGNOSIS — G4733 Obstructive sleep apnea (adult) (pediatric): Secondary | ICD-10-CM | POA: Diagnosis not present

## 2020-02-06 DIAGNOSIS — F331 Major depressive disorder, recurrent, moderate: Secondary | ICD-10-CM | POA: Diagnosis not present

## 2020-02-06 MED ORDER — QUETIAPINE FUMARATE 200 MG PO TABS: 400.0000 mg | ORAL_TABLET | Freq: Every day | ORAL | 0 refills | Status: DC

## 2020-02-06 MED ORDER — LAMOTRIGINE 200 MG PO TABS: 200.0000 mg | ORAL_TABLET | Freq: Two times a day (BID) | ORAL | 0 refills | Status: DC

## 2020-02-06 MED FILL — lamoTRIgine 200 MG TABS: 200 | 90 days supply | Qty: 180 | Fill #0

## 2020-02-06 NOTE — Progress Notes (Signed)
[facility]  Patient ID: Caroline Gonzales,  MRN: [id number]  PCP: Ronnald Nian, DO  Date of Evaluation: 02/06/2020 Time spent:30 minutes  Chief Complaint:  Chief Complaint    Anxiety; Depression; Insomnia      HISTORY/CURRENT STATUS: HPI For routine med check.  Has some anxiety b/c her step-daughter is coming to spend the summer with them. It'll be about 6 week.  There have been a lot of behavioral issues in the past with her acting out.  Caroline Gonzales's son states he does not think things will be the same this time.  She is definitely hoping there will be less stress.  The Klonopin does help.  Since her last visit, she has had a lot more nightmares.  Her sister was in a serious car wreck and landed in the water.  She fractured her sternum and had a lot of other injuries but is recovering well.  Caroline Gonzales has been having dreams about drowning, but they were occurring prior to her sisters wreck.  Because of that she increased the Seroquel up to 400 mg.  It has helped her sleep at least but she still has nightmares.  Not every night.  She also talks in her sleep.  Is tired a lot.  Does not have a lot of motivation.  She does not feel extremely depressed but she is afraid she may be headed that way.  She has been super busy with work and home life that she has not had much time to enjoy herself.  No suicidal or homicidal thoughts.  Denies dizziness, syncope, seizures, numbness, tingling, tremor, tics, unsteady gait, slurred speech, confusion. Denies muscle or joint pain, stiffness, or dystonia.  Individual Medical History/ Review of Systems: Changes? :No    Past medications for mental health diagnoses include: Prozac, Paxil, Effexor, Norpramin, Wellbutrin, doxepin, Elavil, Geodon, Abilify, Lexapro, Trileptal, Ambien, Lunesta, Sonata, trazodone, Restoril, Remeron, Seroquel, Klonopin, propranolol, Lamictal, Deplin, Sonata, Xanax made her feel horrible, Ativan didn't  help  Allergies: Penicillins, Tamiflu [oseltamivir phosphate], Tamiflu [oseltamivir phosphate], Cephalosporins, and Latex  Current Medications:  Current Outpatient Medications:  .  BIOTIN PO, Take by mouth., Disp: , Rfl:  .  Cholecalciferol (VITAMIN D3) 5000 units CAPS, Take 5,000 Units by mouth at bedtime., Disp: , Rfl:  .  clonazePAM (KLONOPIN) 0.5 MG tablet, TAKE 1 TABLET BY MOUTH EVERY 6 HOURS AS NEEDED FOR ANXIETY., Disp: 120 tablet, Rfl: 1 .  gabapentin (NEURONTIN) 300 MG capsule, Take 2 capsules (600 mg total) by mouth at bedtime., Disp: 180 capsule, Rfl: 1 .  ibuprofen (ADVIL,MOTRIN) 200 MG tablet, Take 400 mg by mouth daily as needed for headache or moderate pain., Disp: , Rfl:  .  L-Methylfolate-Algae (DEPLIN 15) 15-90.314 MG CAPS, Take 1 capsule by mouth daily., Disp: 90 capsule, Rfl: 3 .  levonorgestrel (MIRENA) 20 MCG/24HR IUD, 1 each by Intrauterine route once. , Disp: , Rfl:  .  metFORMIN (GLUCOPHAGE) 1000 MG tablet, Take 1 tablet (1,000 mg total) by mouth daily with breakfast., Disp: 30 tablet, Rfl: 5 .  Multiple Vitamin (MULTIVITAMIN) tablet, Take 1 tablet by mouth at bedtime. , Disp: , Rfl:  .  prazosin (MINIPRESS) 2 MG capsule, Take 3 capsules (6 mg total) by mouth at bedtime., Disp: 270 capsule, Rfl: 0 .  propranolol (INDERAL) 40 MG tablet, TAKE 1 TABLET BY MOUTH 2 TIMES DAILY., Disp: 180 tablet, Rfl: 0 .  QUEtiapine (SEROQUEL) 200 MG tablet, Take 2 tablets (400 mg total) by mouth at bedtime., Disp: 180  tablet, Rfl: 0 .  Topiramate ER (TROKENDI XR) 100 MG CP24, Take 100 mg by mouth daily., Disp: 30 capsule, Rfl: 5 .  lamoTRIgine (LAMICTAL) 200 MG tablet, Take 1 tablet (200 mg total) by mouth 2 (two) times daily., Disp: 180 tablet, Rfl: 0 Medication Side Effects: none  Family Medical/ Social History: Changes? No  MENTAL HEALTH EXAM:  There were no vitals taken for this visit.There is no height or weight on file to calculate BMI.  General Appearance: Casual, Neat, Well  Groomed and Obese  Eye Contact:  Good  Speech:  Clear and Coherent and Normal Rate  Volume:  Normal  Mood:  Euthymic  Affect:  Appropriate  Thought Process:  Goal Directed and Descriptions of Associations: Intact  Orientation:  Full (Time, Place, and Person)  Thought Content: Logical   Suicidal Thoughts:  No  Homicidal Thoughts:  No  Memory:  WNL  Judgement:  Good  Insight:  Good  Psychomotor Activity:  Normal  Concentration:  Concentration: Good  Recall:  Good  Fund of Knowledge: Good  Language: Good  Assets:  Desire for Improvement  ADL's:  Intact  Cognition: WNL  Prognosis:  Good    DIAGNOSES:    ICD-10-CM   1. PTSD (post-traumatic stress disorder)  F43.10   2. Major depressive disorder, recurrent episode, moderate (HCC)  F33.1   3. Generalized anxiety disorder  F41.1   4. Obstructive sleep apnea  G47.33   5. Insomnia, unspecified type  G47.00   6. Nightmares  F51.5     Receiving Psychotherapy: Yes Heather Mask   RECOMMENDATIONS:  PDMP was reviewed. I spent 20 minutes with her. Discussed not changing medications on her own without discussing with me.  But she did make the right decision.  I would have done exactly that and increasing the Seroquel.  I think we should increase the Lamictal to help more with depression and I think that will help other things to fall into place. Increase Lamictal to 200 mg, 1 p.o. twice daily. Continue Klonopin 0.5 mg, 1 p.o. every 6 hours as needed. Continue gabapentin 300 mg, 2 p.o. nightly. Continue Deplin 15 mg, 1 p.o. daily. Continue prazosin 2 mg, 3 p.o. nightly. Continue propranolol 40 mg, 1 p.o. twice daily. Continue Seroquel 200 mg, 2 p.o. nightly. Continue counseling. Return in approximately 4 weeks.  Donnal Moat, PA-C

## 2020-02-07 DIAGNOSIS — F4310 Post-traumatic stress disorder, unspecified: Secondary | ICD-10-CM | POA: Diagnosis not present

## 2020-02-14 DIAGNOSIS — F4310 Post-traumatic stress disorder, unspecified: Secondary | ICD-10-CM | POA: Diagnosis not present

## 2020-02-21 DIAGNOSIS — F4310 Post-traumatic stress disorder, unspecified: Secondary | ICD-10-CM | POA: Diagnosis not present

## 2020-02-26 MED FILL — QUETIAPINE FUMARATE 200 MG: 200 | 90 days supply | Qty: 180 | Fill #0

## 2020-02-28 ENCOUNTER — Other Ambulatory Visit: Payer: Self-pay

## 2020-02-28 DIAGNOSIS — F4310 Post-traumatic stress disorder, unspecified: Secondary | ICD-10-CM | POA: Diagnosis not present

## 2020-02-28 MED ORDER — PRAZOSIN HCL 2 MG PO CAPS: 6.0000 mg | ORAL_CAPSULE | Freq: Every day | ORAL | 0 refills | Status: DC

## 2020-02-28 MED FILL — PRAZOSIN 2 MG CAPSULE: 2 | 90 days supply | Qty: 270 | Fill #0

## 2020-02-29 ENCOUNTER — Other Ambulatory Visit: Payer: Self-pay | Admitting: Physician Assistant

## 2020-02-29 NOTE — Telephone Encounter (Signed)
Apt 06/24 °

## 2020-03-01 MED FILL — clonazePAM 0.5 MG TABS: 0.5 | 30 days supply | Qty: 120 | Fill #0

## 2020-03-06 DIAGNOSIS — F4310 Post-traumatic stress disorder, unspecified: Secondary | ICD-10-CM | POA: Diagnosis not present

## 2020-03-07 ENCOUNTER — Encounter: Payer: Self-pay | Admitting: Physician Assistant

## 2020-03-07 ENCOUNTER — Other Ambulatory Visit: Payer: Self-pay

## 2020-03-07 ENCOUNTER — Ambulatory Visit (INDEPENDENT_AMBULATORY_CARE_PROVIDER_SITE_OTHER): Payer: 59 | Admitting: Physician Assistant

## 2020-03-07 DIAGNOSIS — F515 Nightmare disorder: Secondary | ICD-10-CM | POA: Diagnosis not present

## 2020-03-07 DIAGNOSIS — G4700 Insomnia, unspecified: Secondary | ICD-10-CM | POA: Diagnosis not present

## 2020-03-07 DIAGNOSIS — G4733 Obstructive sleep apnea (adult) (pediatric): Secondary | ICD-10-CM | POA: Diagnosis not present

## 2020-03-07 DIAGNOSIS — F411 Generalized anxiety disorder: Secondary | ICD-10-CM

## 2020-03-07 DIAGNOSIS — F331 Major depressive disorder, recurrent, moderate: Secondary | ICD-10-CM | POA: Diagnosis not present

## 2020-03-07 DIAGNOSIS — F4310 Post-traumatic stress disorder, unspecified: Secondary | ICD-10-CM | POA: Diagnosis not present

## 2020-03-07 MED FILL — metFORMIN HCL 1000 MG TABS: 1000 | 30 days supply | Qty: 30 | Fill #1

## 2020-03-07 MED FILL — TROKENDI XR 100 MG CAPSULE: 100 | 30 days supply | Qty: 30 | Fill #1

## 2020-03-07 NOTE — Progress Notes (Signed)
[facility]  Patient ID: Caroline Gonzales,  MRN: [id number]  PCP: Ronnald Nian, DO  Date of Evaluation: 03/07/2020 Time spent:45 minutes  Chief Complaint:  Chief Complaint    Follow-up      HISTORY/CURRENT STATUS: HPI For routine med check.  Not doing well. Still having nightmares. Bloody and gory stuff. Maybe 2 nights a week and when she does have them, it's 3-4 per night.  Does not feel rested.  Does not feel like the prazosin is helping with the nightmares, or may be there less often but that is all.  Hasn't had as much trouble falling asleep, but that's the only good thing.  Is really frustrated and angry that 'I take all these meds and they're not even working.'  States she sometimes wonders whether she should get off of everything and start over.  She has a hard time enjoying things, energy and motivation are low, she does go to work and does not miss days.  Sometimes has a hard time focusing.  Occasionally she will think it would be better for her not to be here but no specific suicidal ideations with the plan.  Anxiety is still an issue and it is more generalized than panic attacks.  The Klonopin does help when as needed.  Patient denies increased energy with decreased need for sleep, no increased talkativeness, no racing thoughts, no impulsivity or risky behaviors, no increased spending, no increased libido, no grandiosity, no increased irritability or anger, and no hallucinations.  Denies dizziness, syncope, seizures, numbness, tingling, tremor, tics, unsteady gait, slurred speech, confusion. Denies muscle or joint pain, stiffness, or dystonia.  Individual Medical History/ Review of Systems: Changes? :No    Past medications for mental health diagnoses include: Prozac, Paxil, Effexor, Norpramin, Wellbutrin, doxepin, Elavil, Geodon, Abilify, Lexapro, Trileptal, Ambien, Lunesta, Sonata, trazodone, Restoril, Remeron, Seroquel, Klonopin, propranolol,  Lamictal, Deplin, Latuda, Xanax made her feel horrible, Ativan didn't help, prazosin  Allergies: Penicillins, Tamiflu [oseltamivir phosphate], Tamiflu [oseltamivir phosphate], Cephalosporins, and Latex  Current Medications:  Current Outpatient Medications:  .  BIOTIN PO, Take by mouth., Disp: , Rfl:  .  Cholecalciferol (VITAMIN D3) 5000 units CAPS, Take 5,000 Units by mouth at bedtime., Disp: , Rfl:  .  clonazePAM (KLONOPIN) 0.5 MG tablet, TAKE 1 TABLET BY MOUTH EVERY 6 HOURS AS NEEDED FOR ANXIETY., Disp: 120 tablet, Rfl: 1 .  gabapentin (NEURONTIN) 300 MG capsule, Take 2 capsules (600 mg total) by mouth at bedtime., Disp: 180 capsule, Rfl: 1 .  ibuprofen (ADVIL,MOTRIN) 200 MG tablet, Take 400 mg by mouth daily as needed for headache or moderate pain., Disp: , Rfl:  .  L-Methylfolate-Algae (DEPLIN 15) 15-90.314 MG CAPS, Take 1 capsule by mouth daily., Disp: 90 capsule, Rfl: 3 .  lamoTRIgine (LAMICTAL) 200 MG tablet, Take 1 tablet (200 mg total) by mouth 2 (two) times daily., Disp: 180 tablet, Rfl: 0 .  levonorgestrel (MIRENA) 20 MCG/24HR IUD, 1 each by Intrauterine route once. , Disp: , Rfl:  .  metFORMIN (GLUCOPHAGE) 1000 MG tablet, Take 1 tablet (1,000 mg total) by mouth daily with breakfast., Disp: 30 tablet, Rfl: 5 .  Multiple Vitamin (MULTIVITAMIN) tablet, Take 1 tablet by mouth at bedtime. , Disp: , Rfl:  .  prazosin (MINIPRESS) 2 MG capsule, Take 3 capsules (6 mg total) by mouth at bedtime., Disp: 270 capsule, Rfl: 0 .  propranolol (INDERAL) 40 MG tablet, TAKE 1 TABLET BY MOUTH 2 TIMES DAILY., Disp: 180 tablet, Rfl: 0 .  QUEtiapine (  SEROQUEL) 200 MG tablet, Take 2 tablets (400 mg total) by mouth at bedtime., Disp: 180 tablet, Rfl: 0 .  Topiramate ER (TROKENDI XR) 100 MG CP24, Take 100 mg by mouth daily., Disp: 30 capsule, Rfl: 5 Medication Side Effects: none  Family Medical/ Social History: Changes? No  MENTAL HEALTH EXAM:  There were no vitals taken for this visit.There is no height  or weight on file to calculate BMI.  General Appearance: Casual, Neat, Well Groomed and Obese  Eye Contact:  Good  Speech:  Clear and Coherent and Normal Rate  Volume:  Normal  Mood:  Depressed  Affect:  Depressed  Thought Process:  Goal Directed and Descriptions of Associations: Intact  Orientation:  Full (Time, Place, and Person)  Thought Content: Logical   Suicidal Thoughts:  No  Homicidal Thoughts:  No  Memory:  WNL  Judgement:  Good  Insight:  Good  Psychomotor Activity:  Normal  Concentration:  Concentration: Good and Attention Span: Good  Recall:  Good  Fund of Knowledge: Good  Language: Good  Assets:  Desire for Improvement  ADL's:  Intact  Cognition: WNL  Prognosis:  Good    DIAGNOSES:    ICD-10-CM   1. Major depressive disorder, recurrent episode, moderate (HCC)  F33.1   2. Generalized anxiety disorder  F41.1   3. Nightmares  F51.5   4. PTSD (post-traumatic stress disorder)  F43.10   5. Insomnia, unspecified type  G47.00   6. Obstructive sleep apnea  G47.33     Receiving Psychotherapy: Yes Heather Mask   RECOMMENDATIONS:  PDMP was reviewed. I provided 45 minutes of face-to-face care with her. Discussed gene site testing, pros and cons and we agreed to do the test.  We discussed several options.  Even though she has tried multiple SSRIs, Luvox may be a good choice because it can help with anxiety, depression, sleep which in turn can help with the nightmares.  She is willing to try it. We will await the results of the gene site testing before adding Luvox and if that drug is an appropriate choice then I can send in the prescription. Since the Lamictal does not seem to be helping at all, it will be okay to wean off.  She will take 200 mg, one half p.o. twice daily for 1 week, one fourth twice daily for 1 week and then one fourth daily for a week and then stop. Continue Klonopin 0.5 mg, 1 p.o. every 6 hours as needed anxiety. Continue gabapentin 300 mg, 2 p.o.  nightly. Continue prazosin 2 mg, 3 p.o. nightly. Continue propranolol 40 mg 1 p.o. twice daily. Continue Seroquel 200 mg, 2 p.o. nightly. Continue counseling with Heather Mask. Return in 4 weeks.  Donnal Moat, PA-C

## 2020-03-07 NOTE — Patient Instructions (Signed)
Wean off Lamictal 200 mg: take 1/2 bid for a week, then 1/4 bid for a week, then 1/4 qd for a week and then stop.

## 2020-03-18 ENCOUNTER — Other Ambulatory Visit: Payer: Self-pay | Admitting: Physician Assistant

## 2020-03-20 ENCOUNTER — Telehealth: Payer: Self-pay | Admitting: Physician Assistant

## 2020-03-20 DIAGNOSIS — F4310 Post-traumatic stress disorder, unspecified: Secondary | ICD-10-CM | POA: Diagnosis not present

## 2020-03-20 MED FILL — PROPRANOLOL 40 MG TABLET: 40 | 90 days supply | Qty: 180 | Fill #0

## 2020-03-20 NOTE — Telephone Encounter (Signed)
Pt called inquiring about the Gene Sight testing results. Requesting a call back.

## 2020-03-20 NOTE — Telephone Encounter (Signed)
Let her know I'll go over the results of the gene site testing more thoroughly at her appointment.  Basically what she and I had talked about was starting Luvox, and it is a drug that should be beneficial so I recommend that we start that.  Let me know the pharmacy and I will send it in.  The other important piece of info is that the folic acid conversion, MTHFR, that we discussed is completely normal.  So Deplin is not necessary.  She can to stop it.

## 2020-03-22 ENCOUNTER — Other Ambulatory Visit: Payer: Self-pay | Admitting: Physician Assistant

## 2020-03-22 MED ORDER — FLUVOXAMINE MALEATE 100 MG PO TABS
ORAL_TABLET | ORAL | 1 refills | Status: DC
Start: 2020-03-22 — End: 2020-05-17

## 2020-03-22 MED FILL — FLUVOXAMINE MALEATE 100 MG: 100 | 30 days supply | Qty: 30 | Fill #0

## 2020-03-22 NOTE — Telephone Encounter (Signed)
Rx was sent to [facility] outpatient pharmacy.

## 2020-03-22 NOTE — Telephone Encounter (Signed)
Pt left a 2nd msg with concerns about her Gene Sight testing results. Please advise.

## 2020-03-27 DIAGNOSIS — F4310 Post-traumatic stress disorder, unspecified: Secondary | ICD-10-CM | POA: Diagnosis not present

## 2020-03-28 ENCOUNTER — Ambulatory Visit: Payer: 59 | Admitting: Family

## 2020-03-28 ENCOUNTER — Encounter: Payer: Self-pay | Admitting: Family

## 2020-03-28 ENCOUNTER — Telehealth: Payer: Self-pay | Admitting: Physician Assistant

## 2020-03-28 ENCOUNTER — Other Ambulatory Visit: Payer: Self-pay | Admitting: Family

## 2020-03-28 ENCOUNTER — Other Ambulatory Visit: Payer: Self-pay

## 2020-03-28 VITALS — BP 128/74 | HR 79 | Temp 97.5°F | Ht 65.0 in | Wt 287.2 lb

## 2020-03-28 DIAGNOSIS — M5431 Sciatica, right side: Secondary | ICD-10-CM | POA: Diagnosis not present

## 2020-03-28 MED ORDER — GABAPENTIN 300 MG PO CAPS: 300.0000 mg | ORAL_CAPSULE | Freq: Three times a day (TID) | ORAL | 3 refills | Status: DC

## 2020-03-28 MED FILL — GABAPENTIN 300 MG CAPSULE: 300 | 30 days supply | Qty: 90 | Fill #0

## 2020-03-28 NOTE — Telephone Encounter (Signed)
Pt left message needing advice about some possible side effect.Started new med Luvox 7/9.  Return call [phone]

## 2020-03-28 NOTE — Patient Instructions (Signed)
Sciatica  Sciatica is pain, weakness, tingling, or loss of feeling (numbness) along the sciatic nerve. The sciatic nerve starts in the lower back and goes down the back of each leg. Sciatica usually goes away on its own or with treatment. Sometimes, sciatica may come back (recur). What are the causes? This condition happens when the sciatic nerve is pinched or has pressure put on it. This may be the result of:  A disk in between the bones of the spine bulging out too far (herniated disk).  Changes in the spinal disks that occur with aging.  A condition that affects a muscle in the butt.  Extra bone growth near the sciatic nerve.  A break (fracture) of the area between your hip bones (pelvis).  Pregnancy.  Tumor. This is rare. What increases the risk? You are more likely to develop this condition if you:  Play sports that put pressure or stress on the spine.  Have poor strength and ease of movement (flexibility).  Have had a back injury in the past.  Have had back surgery.  Sit for long periods of time.  Do activities that involve bending or lifting over and over again.  Are very overweight (obese). What are the signs or symptoms? Symptoms can vary from mild to very bad. They may include:  Any of these problems in the lower back, leg, hip, or butt: ? Mild tingling, loss of feeling, or dull aches. ? Burning sensations. ? Sharp pains.  Loss of feeling in the back of the calf or the sole of the foot.  Leg weakness.  Very bad back pain that makes it hard to move. These symptoms may get worse when you cough, sneeze, or laugh. They may also get worse when you sit or stand for long periods of time. How is this treated? This condition often gets better without any treatment. However, treatment may include:  Changing or cutting back on physical activity when you have pain.  Doing exercises and stretching.  Putting ice or heat on the affected area.  Medicines that  help: ? To relieve pain and swelling. ? To relax your muscles.  Shots (injections) of medicines that help to relieve pain, irritation, and swelling.  Surgery. Follow these instructions at home: Medicines  Take over-the-counter and prescription medicines only as told by your doctor.  Ask your doctor if the medicine prescribed to you: ? Requires you to avoid driving or using heavy machinery. ? Can cause trouble pooping (constipation). You may need to take these steps to prevent or treat trouble pooping:  Drink enough fluids to keep your pee (urine) pale yellow.  Take over-the-counter or prescription medicines.  Eat foods that are high in fiber. These include beans, whole grains, and fresh fruits and vegetables.  Limit foods that are high in fat and sugar. These include fried or sweet foods. Managing pain      If told, put ice on the affected area. ? Put ice in a plastic bag. ? Place a towel between your skin and the bag. ? Leave the ice on for 20 minutes, 2-3 times a day.  If told, put heat on the affected area. Use the heat source that your doctor tells you to use, such as a moist heat pack or a heating pad. ? Place a towel between your skin and the heat source. ? Leave the heat on for 20-30 minutes. ? Remove the heat if your skin turns bright red. This is very important if you are   unable to feel pain, heat, or cold. You may have a greater risk of getting burned. Activity   Return to your normal activities as told by your doctor. Ask your doctor what activities are safe for you.  Avoid activities that make your symptoms worse.  Take short rests during the day. ? When you rest for a long time, do some physical activity or stretching between periods of rest. ? Avoid sitting for a long time without moving. Get up and move around at least one time each hour.  Exercise and stretch regularly, as told by your doctor.  Do not lift anything that is heavier than 10 lb (4.5 kg)  while you have symptoms of sciatica. ? Avoid lifting heavy things even when you do not have symptoms. ? Avoid lifting heavy things over and over.  When you lift objects, always lift in a way that is safe for your body. To do this, you should: ? Bend your knees. ? Keep the object close to your body. ? Avoid twisting. General instructions  Stay at a healthy weight.  Wear comfortable shoes that support your feet. Avoid wearing high heels.  Avoid sleeping on a mattress that is too soft or too hard. You might have less pain if you sleep on a mattress that is firm enough to support your back.  Keep all follow-up visits as told by your doctor. This is important. Contact a doctor if:  You have pain that: ? Wakes you up when you are sleeping. ? Gets worse when you lie down. ? Is worse than the pain you have had in the past. ? Lasts longer than 4 weeks.  You lose weight without trying. Get help right away if:  You cannot control when you pee (urinate) or poop (have a bowel movement).  You have weakness in any of these areas and it gets worse: ? Lower back. ? The area between your hip bones. ? Butt. ? Legs.  You have redness or swelling of your back.  You have a burning feeling when you pee. Summary  Sciatica is pain, weakness, tingling, or loss of feeling (numbness) along the sciatic nerve.  This condition happens when the sciatic nerve is pinched or has pressure put on it.  Sciatica can cause pain, tingling, or loss of feeling (numbness) in the lower back, legs, hips, and butt.  Treatment often includes rest, exercise, medicines, and putting ice or heat on the affected area. This information is not intended to replace advice given to you by your health care provider. Make sure you discuss any questions you have with your health care provider. Document Revised: 09/19/2018 Document Reviewed: 09/19/2018 Elsevier Patient Education  2020 Elsevier Inc.  

## 2020-03-29 MED FILL — clonazePAM 0.5 MG TABS: 0.5 | 30 days supply | Qty: 120 | Fill #1

## 2020-03-29 NOTE — Telephone Encounter (Signed)
LM to call back to discuss.

## 2020-03-29 NOTE — Progress Notes (Signed)
Acute Office Visit  Subjective:    Patient ID: Caroline Gonzales, female    DOB: 1976/05/26, 44 y.o.   MRN: [id number]  Chief Complaint  Patient presents with  . Pain    right leg pain started gettin gworse Sat//pt said she can't go up on right leg but can go down//friday started Luvox 50 mg    HPI Patient is in today with c/o pain in her right hip and leg that goes down her thigh. The pain is worse walking up hills and better when she is sitting. She takes Neurontin at bedtime to help with "sleep." She reports she has also taken 600mg  Ibuprofen without much relief. Denies any injury. Has a history of sciatica in the past and reports being referred to PT for dry needling that helped for about 8 months. Pain 6/10, dull and achy.   Past Medical History:  Diagnosis Date  . Allergy    Zyrtec, Allegra.  . Anxiety    followed by Dr. Toy Cookey  . Back pain   . Bipolar 1 disorder ([place])   . Complication of anesthesia   . Depression   . Fibroid   . Gallbladder problem   . Gastroparesis 09/14/2012   gastric emptying study in 2014  . GERD (gastroesophageal reflux disease)   . Headache   . Pneumonia    2013ish  . PONV (postoperative nausea and vomiting)   . Pre-diabetes   . PTSD (post-traumatic stress disorder)   . Sleep apnea    do not use a cpap  . Tinnitus   . Vitamin D deficiency   . Wears glasses     Past Surgical History:  Procedure Laterality Date  . ANKLE ARTHROSCOPY Left 2011  . CESAREAN SECTION    . CHOLECYSTECTOMY    . DILATION AND CURETTAGE OF UTERUS    . FOREIGN BODY REMOVAL Left 11/18/2016   Procedure: REMOVAL FOREIGN BODY EXTREMITY LEFT FOOT;  Surgeon: Trula Slade, DPM;  Location: [facility];  Service: Podiatry;  Laterality: Left;  . PILONIDAL CYST EXCISION  1990's  . RADIOLOGY WITH ANESTHESIA N/A 11/10/2018   Procedure: MRI WITH ANESTHESIA OF BRAIN AND ORBITS WITH AND WITHOUT CONTRAST;  Surgeon: Radiologist, Medication, MD;  Location: [facility];  Service: Radiology;   Laterality: N/A;  . TENDON REPAIR Left 2011   Left Ankle  . WISDOM TOOTH EXTRACTION  19090's    Family History  Problem Relation Age of Onset  . Cancer Mother        squamous cell carcinoma  . Hyperlipidemia Mother   . Depression Mother   . Anxiety disorder Mother   . Obesity Mother   . Heart disease Father 43       cardiomegaly, CHF; steroid use.  [name] Hyperlipidemia Father   . Hypertension Father   . Mental retardation Father   . High blood pressure Father   . Depression Father   . Anxiety disorder Father   . Obesity Father   . Cancer Maternal Grandmother   . Diabetes Maternal Grandmother   . Heart disease Maternal Grandmother   . Hyperlipidemia Maternal Grandmother   . Hypertension Maternal Grandmother   . Stroke Maternal Grandmother   . Heart disease Paternal Grandmother   . Hypertension Paternal Grandmother   . Heart disease Paternal Grandfather   . Hyperlipidemia Paternal Grandfather   . Mental illness Paternal Grandfather   . Rheum arthritis Sister   . Multiple sclerosis Sister   . Breast cancer Maternal Aunt   .  Thyroid cancer Paternal Aunt     Social History   Socioeconomic History  . Marital status: Married    Spouse name: Not on file  . Number of children: Not on file  . Years of education: Not on file  . Highest education level: Not on file  Occupational History  . Occupation: Programmer, multimedia: [employer]  Tobacco Use  . Smoking status: Former Smoker    Years: 10.00    Types: Cigarettes    Quit date: 2003    Years since quitting: 18.5  . Smokeless tobacco: Never Used  Vaping Use  . Vaping Use: Never used  Substance and Sexual Activity  . Alcohol use: Yes    Alcohol/week: 0.0 standard drinks    Comment: rarely  . Drug use: No  . Sexual activity: Yes    Partners: Male    Birth control/protection: I.U.D.    Comment: Mirena  Other Topics Concern  . 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      Seatbelt: 100%; no texting while driving often       Social Determinants of Radio broadcast assistant Strain:   . Difficulty of Paying Living Expenses:   Food Insecurity:   . Worried About Charity fundraiser in the Last Year:   . Arboriculturist in the Last Year:   Transportation Needs:   . Film/video editor (Medical):   [name] Lack of Transportation (Non-Medical):   Physical Activity:   . Days of Exercise per Week:   . Minutes of Exercise per Session:   Stress:   . Feeling of Stress :   Social Connections:   . Frequency of Communication with Friends and Family:   . Frequency of Social Gatherings with Friends and Family:   . Attends Religious Services:   . Active Member of Clubs or Organizations:   . Attends Archivist Meetings:   [name] Marital Status:   Intimate Partner Violence:   . Fear of Current or Ex-Partner:   . Emotionally Abused:   [name] Physically Abused:   . Sexually Abused:     Outpatient Medications Prior to Visit  Medication Sig Dispense Refill  . BIOTIN PO Take by mouth.    . Cholecalciferol (VITAMIN D3) 5000 units CAPS Take 5,000 Units by mouth at bedtime.    . clonazePAM (KLONOPIN) 0.5 MG tablet TAKE 1 TABLET BY MOUTH EVERY 6 HOURS AS NEEDED FOR ANXIETY. 120 tablet 1  . fluvoxaMINE (LUVOX) 100 MG tablet One half p.o. nightly for 2 weeks, then increase to 1 p.o. nightly 30 tablet 1  . gabapentin (NEURONTIN) 300 MG capsule Take 2 capsules (600 mg total) by mouth at bedtime. 180 capsule 1  . ibuprofen (ADVIL,MOTRIN) 200 MG tablet Take 400 mg by mouth daily as needed for headache or moderate pain.    [name] levonorgestrel (MIRENA) 20 MCG/24HR IUD 1 each by Intrauterine route once.       . metFORMIN (GLUCOPHAGE) 1000 MG tablet Take 1 tablet (  1,000 mg total) by mouth daily with breakfast. 30 tablet 5  . Multiple Vitamin (MULTIVITAMIN) tablet Take 1 tablet by mouth at bedtime.     [name] NEXIUM 20 MG packet Take by mouth at bedtime.    . prazosin (MINIPRESS) 2 MG capsule Take 3 capsules (6 mg total) by mouth at bedtime. 270 capsule 0  . propranolol (INDERAL) 40 MG tablet TAKE 1 TABLET BY MOUTH 2 TIMES DAILY. 180 tablet 0  . QUEtiapine (SEROQUEL) 200 MG tablet Take 2 tablets (400 mg total) by mouth at bedtime. 180 tablet 0  . Topiramate ER (TROKENDI XR) 100 MG CP24 Take 100 mg by mouth daily. 30 capsule 5  . L-Methylfolate-Algae (DEPLIN 15) 15-90.314 MG CAPS Take 1 capsule by mouth daily. 90 capsule 3  . lamoTRIgine (LAMICTAL) 200 MG tablet Take 1 tablet (200 mg total) by mouth 2 (two) times daily. 180 tablet 0   No facility-administered medications prior to visit.    Allergies  Allergen Reactions  . Penicillins Shortness Of Breath and Rash    Did it involve swelling of the face/tongue/throat, SOB, or low BP? Yes Did it involve sudden or severe rash/hives, skin peeling, or any reaction on the inside of your mouth or nose? Yes Did you need to seek medical attention at a hospital or doctor's office? Yes When did it last happen?childhood allergy If all above answers are "NO", may proceed with cephalosporin use.    . Tamiflu [Oseltamivir Phosphate] Anaphylaxis  . Tamiflu [Oseltamivir Phosphate] Anaphylaxis  . Cephalosporins Rash  . Latex Rash    Review of Systems  Respiratory: Negative.   Cardiovascular: Negative.   Musculoskeletal: Positive for arthralgias. Negative for back pain and neck pain.       Right hip/buttocks pain that goes down the right leg  Neurological: Negative.  Negative for numbness.  Psychiatric/Behavioral: Negative.   All other systems reviewed and are negative.      Objective:    Physical Exam Constitutional:      Appearance: Normal  appearance. She is obese.  Cardiovascular:     Rate and Rhythm: Normal rate and regular rhythm.  Pulmonary:     Effort: Pulmonary effort is normal.     Breath sounds: Normal breath sounds.  Musculoskeletal:        General: Tenderness present. No swelling or deformity.     Cervical back: Normal range of motion and neck supple.     Right lower leg: No edema.     Comments: Pain to palpation of the right glut and down the right thigh. No pain with ROM. No swelling or abnormality.   Skin:    General: Skin is warm and dry.  Neurological:     General: No focal deficit present.     Mental Status: She is alert and oriented to person, place, and time.     Cranial Nerves: No cranial nerve deficit.     Motor: No weakness.     Deep Tendon Reflexes: Reflexes normal.  Psychiatric:        Mood and Affect: Mood normal.     BP 128/74   Pulse 79   Temp (!) 97.5 F (36.4 C) (Tympanic)   Ht 5\' 5"  (1.651 m)   Wt 287 lb 3.2 oz (130.3 kg)   SpO2 98%   BMI 47.79 kg/m  Wt Readings from Last 3 Encounters:  03/28/20 287 lb 3.2 oz (130.3 kg)  01/18/20 280 lb (127 kg)  12/26/19 279 lb (126.6 kg)  Health Maintenance Due  Topic Date Due  . Hepatitis C Screening  Never done  . COVID-19 Vaccine (1) Never done  . PAP SMEAR-Modifier  12/12/2018    There are no preventive care reminders to display for this patient.   Lab Results  Component Value Date   TSH 1.620 08/31/2016   Lab Results  Component Value Date   WBC 8.3 04/07/2019   HGB 11.7 (L) 04/07/2019   HCT 37.1 04/07/2019   MCV 91.2 04/07/2019   PLT 253 04/07/2019   Lab Results  Component Value Date   NA 137 09/05/2019   K 4.4 09/05/2019   CO2 24 09/05/2019   GLUCOSE 91 09/05/2019   BUN 11 09/05/2019   CREATININE 1.02 (H) 09/05/2019   BILITOT 0.2 09/05/2019   ALKPHOS 103 09/05/2019   AST 16 09/05/2019   ALT 15 09/05/2019   PROT 7.0 09/05/2019   ALBUMIN 3.9 09/05/2019   CALCIUM 9.0 09/05/2019   ANIONGAP 10 04/07/2019    Lab Results  Component Value Date   CHOL 157 03/22/2019   Lab Results  Component Value Date   HDL 41 03/22/2019   Lab Results  Component Value Date   LDLCALC 70 03/22/2019   Lab Results  Component Value Date   TRIG 231 (H) 03/22/2019   Lab Results  Component Value Date   CHOLHDL 3.5 02/10/2018   Lab Results  Component Value Date   HGBA1C 5.7 (H) 09/05/2019       Assessment & Plan:   Problem List Items Addressed This Visit    None    Visit Diagnoses    Sciatica of right side    -  Primary   Relevant Medications   gabapentin (NEURONTIN) 300 MG capsule   Other Relevant Orders   Ambulatory referral to Physical Therapy       Meds ordered this encounter  Medications  . gabapentin (NEURONTIN) 300 MG capsule    Sig: Take 1 capsule (300 mg total) by mouth 3 (three) times daily.    Dispense:  90 capsule    Refill:  3    1 in the morning, 2 at bedtine   Caroline Gonzales was seen today for pain.  Diagnoses and all orders for this visit:  Sciatica of right side -     Ambulatory referral to Physical Therapy  Other orders -     gabapentin (NEURONTIN) 300 MG capsule; Take 1 capsule (300 mg total) by mouth 3 (three) times daily.  Referred to PT for dry needling. Neurontin increased temporarily to 1 in the morning and 2 and bedtime when she is not working. Call with questions or concerns.   Kennyth Arnold, FNP

## 2020-04-02 MED FILL — metFORMIN HCL 1000 MG TABS: 1000 | 30 days supply | Qty: 30 | Fill #2

## 2020-04-02 MED FILL — TROKENDI XR 100 MG CAPSULE: 100 | 30 days supply | Qty: 30 | Fill #2

## 2020-04-03 DIAGNOSIS — F4310 Post-traumatic stress disorder, unspecified: Secondary | ICD-10-CM | POA: Diagnosis not present

## 2020-04-05 ENCOUNTER — Encounter: Payer: Self-pay | Admitting: Physician Assistant

## 2020-04-05 ENCOUNTER — Ambulatory Visit (INDEPENDENT_AMBULATORY_CARE_PROVIDER_SITE_OTHER): Payer: 59 | Admitting: Physician Assistant

## 2020-04-05 ENCOUNTER — Other Ambulatory Visit: Payer: Self-pay

## 2020-04-05 VITALS — BP 140/71 | HR 71

## 2020-04-05 DIAGNOSIS — F411 Generalized anxiety disorder: Secondary | ICD-10-CM | POA: Diagnosis not present

## 2020-04-05 DIAGNOSIS — F331 Major depressive disorder, recurrent, moderate: Secondary | ICD-10-CM | POA: Diagnosis not present

## 2020-04-05 DIAGNOSIS — F4310 Post-traumatic stress disorder, unspecified: Secondary | ICD-10-CM | POA: Diagnosis not present

## 2020-04-05 DIAGNOSIS — G4700 Insomnia, unspecified: Secondary | ICD-10-CM

## 2020-04-05 DIAGNOSIS — G4733 Obstructive sleep apnea (adult) (pediatric): Secondary | ICD-10-CM

## 2020-04-05 NOTE — Progress Notes (Signed)
[facility]  Patient ID: Caroline Gonzales,  MRN: [id number]  PCP: Ronnald Nian, DO  Date of Evaluation: 04/05/2020 Time spent:30 minutes  Chief Complaint:  Chief Complaint    Insomnia; Anxiety; Depression      HISTORY/CURRENT STATUS: HPI For routine med check.  After last visit, we obtained GeneSight testing results.  Due to those results, we started Luvox since her last OV.  The first few days she felt a little dizzy and "not so great" but the symptoms went away quickly.  She has been on 50 mg for the past 2 weeks and will start 100 mg tonight.  States she can already tell a difference.  She is sleeping well, having no nightmares now, energy and motivation are good, and overall she feels better.  No suicidal or homicidal thoughts.  This past weekend, she had to make an emergency trip to [place] where her dad was hospitalized and on a ventilator.  He was extubated and is not doing well but will have to go to a nursing home.  He has been sick for quite a while and she knows what is coming, but even through all of this stress, states that she has been able to handle things without falling apart.  "I cry but I feel like that is normal and then I am able to push through it."    Anxiety is controlled, feels that Klonopin is helping.  Gabapentin is helping the restlessness in her legs.  That had been increased by her PCP a few weeks ago because she had sciatica but that is better now and Jeanett decreased the gabapentin back down to 600 mg nightly and is doing fine.  Patient denies increased energy with decreased need for sleep, no increased talkativeness, no racing thoughts, no impulsivity or risky behaviors, no increased spending, no increased libido, no grandiosity, no increased irritability or anger, and no hallucinations.  She would like to start decreasing the Seroquel if appropriate.  Denies dizziness, syncope, seizures, numbness, tingling, tremor, tics, unsteady  gait, slurred speech, confusion. Denies muscle or joint pain, stiffness, or dystonia.  Individual Medical History/ Review of Systems: Changes? :No    Past medications for mental health diagnoses include: Prozac, Paxil, Effexor, Norpramin, Wellbutrin, doxepin, Elavil, Geodon, Abilify, Lexapro, Trileptal, Ambien, Lunesta, Sonata, trazodone, Restoril, Remeron, Seroquel, Klonopin, propranolol, Lamictal, Deplin, Latuda, Xanax made her feel horrible, Ativan didn't help, prazosin  Allergies: Penicillins, Tamiflu [oseltamivir phosphate], Tamiflu [oseltamivir phosphate], Cephalosporins, and Latex  Current Medications:  Current Outpatient Medications:  .  BIOTIN PO, Take by mouth., Disp: , Rfl:  .  Cholecalciferol (VITAMIN D3) 5000 units CAPS, Take 5,000 Units by mouth at bedtime., Disp: , Rfl:  .  clonazePAM (KLONOPIN) 0.5 MG tablet, TAKE 1 TABLET BY MOUTH EVERY 6 HOURS AS NEEDED FOR ANXIETY., Disp: 120 tablet, Rfl: 1 .  fluvoxaMINE (LUVOX) 100 MG tablet, One half p.o. nightly for 2 weeks, then increase to 1 p.o. nightly, Disp: 30 tablet, Rfl: 1 .  gabapentin (NEURONTIN) 300 MG capsule, Take 2 capsules (600 mg total) by mouth at bedtime., Disp: 180 capsule, Rfl: 1 .  gabapentin (NEURONTIN) 300 MG capsule, Take 1 capsule (300 mg total) by mouth 3 (three) times daily., Disp: 90 capsule, Rfl: 3 .  ibuprofen (ADVIL,MOTRIN) 200 MG tablet, Take 400 mg by mouth daily as needed for headache or moderate pain., Disp: , Rfl:  .  levonorgestrel (MIRENA) 20 MCG/24HR IUD, 1 each by Intrauterine route once. , Disp: , Rfl:  .  metFORMIN (GLUCOPHAGE) 1000 MG tablet, Take 1 tablet (1,000 mg total) by mouth daily with breakfast., Disp: 30 tablet, Rfl: 5 .  Multiple Vitamin (MULTIVITAMIN) tablet, Take 1 tablet by mouth at bedtime. , Disp: , Rfl:  .  NEXIUM 20 MG packet, Take by mouth at bedtime., Disp: , Rfl:  .  prazosin (MINIPRESS) 2 MG capsule, Take 3 capsules (6 mg total) by mouth at bedtime., Disp: 270 capsule, Rfl:  0 .  propranolol (INDERAL) 40 MG tablet, TAKE 1 TABLET BY MOUTH 2 TIMES DAILY., Disp: 180 tablet, Rfl: 0 .  QUEtiapine (SEROQUEL) 200 MG tablet, Take 2 tablets (400 mg total) by mouth at bedtime., Disp: 180 tablet, Rfl: 0 .  Topiramate ER (TROKENDI XR) 100 MG CP24, Take 100 mg by mouth daily., Disp: 30 capsule, Rfl: 5 Medication Side Effects: none  Family Medical/ Social History: Changes? See HPI  MENTAL HEALTH EXAM:  Blood pressure (!) 140/71, pulse 71.There is no height or weight on file to calculate BMI.  General Appearance: Casual, Neat, Well Groomed and Obese  Eye Contact:  Good  Speech:  Clear and Coherent and Normal Rate  Volume:  Normal  Mood:  Euthymic  Affect:  Appropriate  Thought Process:  Goal Directed and Descriptions of Associations: Intact  Orientation:  Full (Time, Place, and Person)  Thought Content: Logical   Suicidal Thoughts:  No  Homicidal Thoughts:  No  Memory:  WNL  Judgement:  Good  Insight:  Good  Psychomotor Activity:  Normal  Concentration:  Concentration: Good and Attention Span: Good  Recall:  Good  Fund of Knowledge: Good  Language: Good  Assets:  Desire for Improvement  ADL's:  Intact  Cognition: WNL  Prognosis:  Good    DIAGNOSES:    ICD-10-CM   1. PTSD (post-traumatic stress disorder)  F43.10   2. Major depressive disorder, recurrent episode, moderate (HCC)  F33.1   3. Generalized anxiety disorder  F41.1   4. Insomnia, unspecified type  G47.00   5. Obstructive sleep apnea  G47.33     Receiving Psychotherapy: Yes Heather Mask   RECOMMENDATIONS:  PDMP was reviewed. I provided 30 minutes of face-to-face time during this encounter. We discussed decreasing the Seroquel.  I think it is fine at this point, because we were using it for sleep/PTSD/nightmares, and she is doing much better.  Of course that may be due to the Seroquel.  We will gradually decrease and see how she does at the next visit. Decrease Seroquel 200 mg, to 1.5 pills  nightly for 2 weeks, then 1 p.o. nightly for 2 weeks, then 1/2 pill nightly until I see her again. Continue Klonopin 0.5 mg, 1 every 6 hours as needed. Continue Luvox, increasing to 100 mg p.o. nightly tonight. Continue gabapentin 300 mg, 2 p.o. nightly. Continue prazosin 2 mg, 3 p.o. nightly. Continue propranolol 40 mg, 1 p.o. twice daily. Continue Trokendi XR per another provider. Continue therapy with Heather Mask. Return in 6 weeks.  Donnal Moat, PA-C

## 2020-04-05 NOTE — Patient Instructions (Signed)
On the Seroquel 200 mg, decrease that to 1.5 pills every night for 2 weeks then take 1 pill every night for 2 weeks, then take 1/2 pill every night for 2 weeks or until we see each other the next time.

## 2020-04-10 DIAGNOSIS — F4310 Post-traumatic stress disorder, unspecified: Secondary | ICD-10-CM | POA: Diagnosis not present

## 2020-04-17 DIAGNOSIS — F4310 Post-traumatic stress disorder, unspecified: Secondary | ICD-10-CM | POA: Diagnosis not present

## 2020-04-24 DIAGNOSIS — F4310 Post-traumatic stress disorder, unspecified: Secondary | ICD-10-CM | POA: Diagnosis not present

## 2020-04-25 MED FILL — FLUVOXAMINE MALEATE 100 MG: 100 | 30 days supply | Qty: 30 | Fill #1

## 2020-05-01 DIAGNOSIS — F4310 Post-traumatic stress disorder, unspecified: Secondary | ICD-10-CM | POA: Diagnosis not present

## 2020-05-02 ENCOUNTER — Ambulatory Visit: Payer: Self-pay | Admitting: Obstetrics and Gynecology

## 2020-05-03 ENCOUNTER — Ambulatory Visit: Payer: Self-pay | Admitting: Obstetrics and Gynecology

## 2020-05-07 DIAGNOSIS — H524 Presbyopia: Secondary | ICD-10-CM | POA: Diagnosis not present

## 2020-05-07 DIAGNOSIS — H5509 Other forms of nystagmus: Secondary | ICD-10-CM | POA: Diagnosis not present

## 2020-05-07 DIAGNOSIS — H04123 Dry eye syndrome of bilateral lacrimal glands: Secondary | ICD-10-CM | POA: Diagnosis not present

## 2020-05-07 DIAGNOSIS — H40013 Open angle with borderline findings, low risk, bilateral: Secondary | ICD-10-CM | POA: Diagnosis not present

## 2020-05-08 DIAGNOSIS — F4310 Post-traumatic stress disorder, unspecified: Secondary | ICD-10-CM | POA: Diagnosis not present

## 2020-05-10 ENCOUNTER — Encounter: Payer: Self-pay | Admitting: Family Medicine

## 2020-05-10 ENCOUNTER — Other Ambulatory Visit: Payer: Self-pay | Admitting: Physician Assistant

## 2020-05-10 ENCOUNTER — Ambulatory Visit: Payer: Self-pay | Admitting: Obstetrics and Gynecology

## 2020-05-10 MED FILL — TROKENDI XR 100 MG CAPSULE: 100 | 30 days supply | Qty: 30 | Fill #3

## 2020-05-10 MED FILL — metFORMIN HCL 1000 MG TABS: 1000 | 30 days supply | Qty: 30 | Fill #3

## 2020-05-12 NOTE — Telephone Encounter (Signed)
Next apt 05/17/2020

## 2020-05-13 MED FILL — clonazePAM 0.5 MG TABS: 0.5 | 30 days supply | Qty: 120 | Fill #0

## 2020-05-15 DIAGNOSIS — F4310 Post-traumatic stress disorder, unspecified: Secondary | ICD-10-CM | POA: Diagnosis not present

## 2020-05-15 MED FILL — GABAPENTIN 300 MG CAPSULE: 300 | 30 days supply | Qty: 90 | Fill #1

## 2020-05-17 ENCOUNTER — Other Ambulatory Visit: Payer: Self-pay | Admitting: Physician Assistant

## 2020-05-17 ENCOUNTER — Encounter: Payer: Self-pay | Admitting: Physician Assistant

## 2020-05-17 ENCOUNTER — Telehealth (INDEPENDENT_AMBULATORY_CARE_PROVIDER_SITE_OTHER): Payer: 59 | Admitting: Physician Assistant

## 2020-05-17 ENCOUNTER — Telehealth: Payer: Self-pay | Admitting: Physician Assistant

## 2020-05-17 DIAGNOSIS — F515 Nightmare disorder: Secondary | ICD-10-CM | POA: Diagnosis not present

## 2020-05-17 DIAGNOSIS — F3341 Major depressive disorder, recurrent, in partial remission: Secondary | ICD-10-CM

## 2020-05-17 DIAGNOSIS — F411 Generalized anxiety disorder: Secondary | ICD-10-CM

## 2020-05-17 DIAGNOSIS — G4700 Insomnia, unspecified: Secondary | ICD-10-CM

## 2020-05-17 DIAGNOSIS — F4310 Post-traumatic stress disorder, unspecified: Secondary | ICD-10-CM

## 2020-05-17 MED ORDER — FLUVOXAMINE MALEATE 100 MG PO TABS: 100.0000 mg | ORAL_TABLET | Freq: Every day | ORAL | 1 refills | Status: DC

## 2020-05-17 MED ORDER — PRAZOSIN HCL 2 MG PO CAPS: 6.0000 mg | ORAL_CAPSULE | Freq: Every day | ORAL | 1 refills | Status: DC

## 2020-05-17 MED ORDER — QUETIAPINE FUMARATE 200 MG PO TABS: 100.0000 mg | ORAL_TABLET | Freq: Every day | ORAL | 0 refills | Status: DC

## 2020-05-17 NOTE — Telephone Encounter (Signed)
[name], Caroline Gonzales are scheduled for a virtual visit with your provider today.    Just as we do with appointments in the office, we must obtain your consent to participate.  Your consent will be active for this visit and any virtual visit you may have with one of our providers in the next 365 days.    If you have a MyChart account, I can also send a copy of this consent to you electronically.  All virtual visits are billed to your insurance company just like a traditional visit in the office.  As this is a virtual visit, video technology does not allow for your provider to perform a traditional examination.  This may limit your provider's ability to fully assess your condition.  If your provider identifies any concerns that need to be evaluated in person or the need to arrange testing such as labs, EKG, etc, we will make arrangements to do so.    Although advances in technology are sophisticated, we cannot ensure that it will always work on either your end or our end.  If the connection with a video visit is poor, we may have to switch to a telephone visit.  With either a video or telephone visit, we are not always able to ensure that we have a secure connection.   I need to obtain your verbal consent now.   Are you willing to proceed with your visit today?   Caroline Gonzales has provided verbal consent on 05/17/2020 for a virtual visit (video or telephone).   Donnal Moat, PA-C 05/17/2020  12:21 PM
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
## 2020-05-21 MED FILL — PRAZOSIN 2 MG CAPSULE: 2 | 90 days supply | Qty: 270 | Fill #0

## 2020-05-21 MED FILL — FLUVOXAMINE MALEATE 100 MG: 100 | 90 days supply | Qty: 90 | Fill #0

## 2020-05-22 DIAGNOSIS — F4310 Post-traumatic stress disorder, unspecified: Secondary | ICD-10-CM | POA: Diagnosis not present

## 2020-05-24 ENCOUNTER — Encounter: Payer: Self-pay | Admitting: Physician Assistant

## 2020-05-29 DIAGNOSIS — F4310 Post-traumatic stress disorder, unspecified: Secondary | ICD-10-CM | POA: Diagnosis not present

## 2020-06-05 DIAGNOSIS — F4310 Post-traumatic stress disorder, unspecified: Secondary | ICD-10-CM | POA: Diagnosis not present

## 2020-06-12 DIAGNOSIS — F4310 Post-traumatic stress disorder, unspecified: Secondary | ICD-10-CM | POA: Diagnosis not present

## 2020-06-14 ENCOUNTER — Other Ambulatory Visit: Payer: Self-pay | Admitting: Physician Assistant

## 2020-06-14 MED FILL — metFORMIN HCL 1000 MG TABS: 1000 | 30 days supply | Qty: 30 | Fill #4

## 2020-06-14 MED FILL — PROPRANOLOL 40 MG TABLET: 40 | 90 days supply | Qty: 180 | Fill #0

## 2020-06-14 MED FILL — GABAPENTIN 300 MG CAPSULE: 300 | 30 days supply | Qty: 90 | Fill #2

## 2020-06-14 MED FILL — clonazePAM 0.5 MG TABS: 0.5 | 30 days supply | Qty: 120 | Fill #1

## 2020-06-14 MED FILL — TROKENDI XR 100 MG CAPSULE: 100 | 30 days supply | Qty: 30 | Fill #4

## 2020-06-14 NOTE — Telephone Encounter (Signed)
Please review

## 2020-06-19 DIAGNOSIS — F4310 Post-traumatic stress disorder, unspecified: Secondary | ICD-10-CM | POA: Diagnosis not present

## 2020-07-09 ENCOUNTER — Encounter: Payer: Self-pay | Admitting: Family Medicine

## 2020-07-10 DIAGNOSIS — F4310 Post-traumatic stress disorder, unspecified: Secondary | ICD-10-CM | POA: Diagnosis not present

## 2020-07-17 ENCOUNTER — Telehealth: Payer: Self-pay

## 2020-07-17 ENCOUNTER — Encounter: Payer: Self-pay | Admitting: Obstetrics and Gynecology

## 2020-07-17 DIAGNOSIS — F4310 Post-traumatic stress disorder, unspecified: Secondary | ICD-10-CM | POA: Diagnosis not present

## 2020-07-17 DIAGNOSIS — Z30431 Encounter for routine checking of intrauterine contraceptive device: Secondary | ICD-10-CM

## 2020-07-17 NOTE — Telephone Encounter (Signed)
Yes, I agree with the plan

## 2020-07-17 NOTE — Telephone Encounter (Signed)
AEX 04/2019, next scheduled 10/2020 Mirena IUD placed 03/2018 Obesity  Former smoker H/o fibroid   Spoke with pt. Pt states having severe lower abd  cramps with nausea x 2 days. Pt states nausea is now gone. Denies any vaginal bleeding, clots, changes in medications, diet or food. Denies any vomiting, diarrhea, fever, chills. Pt rates pain as 7 on scale. Pt states taking 600 mg of Motrin every 8 hours as needed. Does help improve cramping. Pt states is worried about IUD placement, states has not and does not check strings and does not have monthly bleeding cycles with IUD in place.  Pt advised will set up PUS appt tomorrow and will review with Dr Talbert Nan for plan of care. Pt agreeable and verbalized understanding to date and time of appt. 11/4 at 3 pm, with OV consult to follow. Pt also advised severe pain and increased/worsening symptoms overnight before appt. Pt verbalized understanding.   Routing to Dr Talbert Nan,   [facility] to proceed as scheduled?  Cc: Hayley for precert, if approved. Orders placed

## 2020-07-17 NOTE — Telephone Encounter (Signed)
Encounter closed

## 2020-07-17 NOTE — Telephone Encounter (Signed)
Pt sent following mychart message:  Caroline Gonzales, Caroline Gonzales Clinical Pool I am having menstrual-type cramps not controlled by Motrin (I took 600 mg). I have an IUD, and I don't remember having this before. Yesterday morning, the pain was severe enough that I was nauseated (about a 9/10). Last night I used a heating pad, and it helped, but sitting up and walking at work seems to make it worse, and sometimes I have to bend over to ease the pain.   I am having no bleeding, and I don't remember cramps like this before I started contraception. I got my first IUD in 2014, and I was on OCP from about 1998 until then, except when I was trying to conceive and when I was pregnant in 2006/2007.   At what point do I get worried about the pain? Do I not need to worry because I'm not bleeding? Do I need to worry about uterine perf with my IUD? If you need to reach me quickly, my cell is [phone]; I have intermittant access to non-work internet. I also have a telehealth therapy visit from 3-4, so if you call during that time, I won't be able to answer. If I need to come in, I can cancel that appt, though, no problem, I just need to know asap.   Thank you,  Caroline Gonzales

## 2020-07-18 ENCOUNTER — Other Ambulatory Visit: Payer: Self-pay

## 2020-07-18 ENCOUNTER — Ambulatory Visit: Payer: 59 | Admitting: Obstetrics and Gynecology

## 2020-07-18 ENCOUNTER — Ambulatory Visit (INDEPENDENT_AMBULATORY_CARE_PROVIDER_SITE_OTHER): Payer: 59

## 2020-07-18 ENCOUNTER — Other Ambulatory Visit: Payer: Self-pay | Admitting: Obstetrics and Gynecology

## 2020-07-18 ENCOUNTER — Encounter: Payer: Self-pay | Admitting: Obstetrics and Gynecology

## 2020-07-18 VITALS — BP 110/64 | HR 68 | Ht 65.0 in | Wt 286.0 lb

## 2020-07-18 DIAGNOSIS — R102 Pelvic and perineal pain: Secondary | ICD-10-CM | POA: Diagnosis not present

## 2020-07-18 DIAGNOSIS — Z30431 Encounter for routine checking of intrauterine contraceptive device: Secondary | ICD-10-CM

## 2020-07-18 DIAGNOSIS — N83202 Unspecified ovarian cyst, left side: Secondary | ICD-10-CM

## 2020-07-18 MED ORDER — OXYCODONE HCL 5 MG PO CAPS: 5.0000 mg | ORAL_CAPSULE | ORAL | 0 refills | Status: DC | PRN

## 2020-07-18 NOTE — Progress Notes (Signed)
GYNECOLOGY  VISIT   HPI: 44 y.o.   Married White or Caucasian Not Hispanic or Latino  female   G3P1000 with No LMP recorded. (Menstrual status: IUD).   here for evaluation of lower abdominal/pelvic pain. She starting having pelvic pain on Tuesday, severe, almost went to the ER. Pain is cramping, intermittently sharp. She is taking motrin around the clock. Not sleeping well. Currently pain is a 6-7/10 in severity, down from a 9/10 in severity.  Some nausea, no bowel or bladder c/o.  No bleeding. Has a mirena IUD, doesn't typically have cycles.   GYNECOLOGIC HISTORY: No LMP recorded. (Menstrual status: IUD). Contraception:IUD Menopausal hormone therapy: none        OB History    Gravida  3   Para  1   Term  1   Preterm  0   AB  0   Living  0     SAB  0   TAB  0   Ectopic  0   Multiple  0   Live Births  0              Patient Active Problem List   Diagnosis Date Noted  . PTSD (post-traumatic stress disorder) 07/21/2018  . GAD (generalized anxiety disorder) 07/21/2018  . Insomnia 07/21/2018  . MDD (major depressive disorder) 07/21/2018  . Nightmares 07/21/2018  . Insulin resistance 10/21/2017  . Other hyperlipidemia 10/07/2017  . Vitamin D deficiency 10/07/2017  . Bipolar depression ([place]) 04/27/2017  . Class 3 obesity with serious comorbidity and body mass index (BMI) of 40.0 to 44.9 in adult 04/27/2017  . Residual foreign body in soft tissue 11/12/2016  . Prediabetes 11/02/2016  . Morbid obesity ([place]) 10/15/2016  . Bipolar 1 disorder ([place])     Past Medical History:  Diagnosis Date  . Allergy    Zyrtec, Allegra.  . Anxiety    followed by Dr. Toy Cookey  . Back pain   . Bipolar 1 disorder ([place])   . Complication of anesthesia   . Depression   . Fibroid   . Gallbladder problem   . Gastroparesis 09/14/2012   gastric emptying study in 2014  . GERD (gastroesophageal reflux disease)   . Headache   . Pneumonia    2013ish  . PONV (postoperative nausea and  vomiting)   . Pre-diabetes   . PTSD (post-traumatic stress disorder)   . Sleep apnea    do not use a cpap  . Tinnitus   . Vitamin D deficiency   . Wears glasses     Past Surgical History:  Procedure Laterality Date  . ANKLE ARTHROSCOPY Left 2011  . CESAREAN SECTION    . CHOLECYSTECTOMY    . DILATION AND CURETTAGE OF UTERUS    . FOREIGN BODY REMOVAL Left 11/18/2016   Procedure: REMOVAL FOREIGN BODY EXTREMITY LEFT FOOT;  Surgeon: Trula Slade, DPM;  Location: [facility];  Service: Podiatry;  Laterality: Left;  . PILONIDAL CYST EXCISION  1990's  . RADIOLOGY WITH ANESTHESIA N/A 11/10/2018   Procedure: MRI WITH ANESTHESIA OF BRAIN AND ORBITS WITH AND WITHOUT CONTRAST;  Surgeon: Radiologist, Medication, MD;  Location: [facility];  Service: Radiology;  Laterality: N/A;  . TENDON REPAIR Left 2011   Left Ankle  . WISDOM TOOTH EXTRACTION  19090's    Current Outpatient Medications  Medication Sig Dispense Refill  . Cholecalciferol (VITAMIN D3) 5000 units CAPS Take 5,000 Units by mouth at bedtime.    . clonazePAM (KLONOPIN) 0.5 MG tablet TAKE 1  TABLET BY MOUTH EVERY 6 HOURS AS NEEDED FOR ANXIETY. 120 tablet 1  . fluvoxaMINE (LUVOX) 100 MG tablet Take 1 tablet (100 mg total) by mouth at bedtime. 90 tablet 1  . gabapentin (NEURONTIN) 300 MG capsule Take 2 capsules (600 mg total) by mouth at bedtime. 180 capsule 1  . ibuprofen (ADVIL,MOTRIN) 200 MG tablet Take 400 mg by mouth daily as needed for headache or moderate pain.    [name] levonorgestrel (MIRENA) 20 MCG/24HR IUD 1 each by Intrauterine route once.     . Multiple Vitamin (MULTIVITAMIN) tablet Take 1 tablet by mouth at bedtime.     [name] NEXIUM 20 MG packet Take by mouth at bedtime.    . prazosin (MINIPRESS) 2 MG capsule Take 3 capsules (6 mg total) by mouth at bedtime. 270 capsule 1  . propranolol (INDERAL) 40 MG tablet TAKE 1 TABLET BY MOUTH 2 TIMES DAILY. 180 tablet 0  . QUEtiapine (SEROQUEL) 200 MG tablet Take 0.5 tablets (100 mg total) by mouth at  bedtime. 90 tablet 0  . Topiramate ER (TROKENDI XR) 100 MG CP24 Take 100 mg by mouth daily. 30 capsule 5  . metFORMIN (GLUCOPHAGE) 1000 MG tablet Take 1 tablet (1,000 mg total) by mouth daily with breakfast. 30 tablet 5   No current facility-administered medications for this visit.     ALLERGIES: Penicillins, Tamiflu [oseltamivir phosphate], Tamiflu [oseltamivir phosphate], Cephalosporins, and Latex  Family History  Problem Relation Age of Onset  . Cancer Mother        squamous cell carcinoma  . Hyperlipidemia Mother   . Depression Mother   . Anxiety disorder Mother   . Obesity Mother   . Heart disease Father 52       cardiomegaly, CHF; steroid use.  [name] Hyperlipidemia Father   . Hypertension Father   . Mental retardation Father   . High blood pressure Father   . Depression Father   . Anxiety disorder Father   . Obesity Father   . Cancer Maternal Grandmother   . Diabetes Maternal Grandmother   . Heart disease Maternal Grandmother   . Hyperlipidemia Maternal Grandmother   . Hypertension Maternal Grandmother   . Stroke Maternal Grandmother   . Heart disease Paternal Grandmother   . Hypertension Paternal Grandmother   . Heart disease Paternal Grandfather   . Hyperlipidemia Paternal Grandfather   . Mental illness Paternal Grandfather   . Rheum arthritis Sister   . Multiple sclerosis Sister   . Breast cancer Maternal Aunt   . Thyroid cancer Paternal Aunt     Social History   Socioeconomic History  . Marital status: Married    Spouse name: Not on file  . Number of children: Not on file  . Years of education: Not on file  . Highest education level: Not on file  Occupational History  . Occupation: Programmer, multimedia: [employer]  Tobacco Use  . Smoking status: Former Smoker    Years: 10.00    Types: Cigarettes    Quit date: 2003    Years since quitting: 18.8  . Smokeless tobacco: Never Used  Vaping Use  . Vaping Use: Never used  Substance and Sexual Activity  .  Alcohol use: Yes    Alcohol/week: 0.0 standard drinks    Comment: every few months  . Drug use: No  . Sexual activity: Yes    Partners: Male    Birth control/protection: I.U.D.    Comment: Mirena  Other Topics Concern  .  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      Seatbelt: 100%; no texting while driving often       Social Determinants of Health   Financial Resource Strain:   . Difficulty of Paying Living Expenses: Not on file  Food Insecurity:   . Worried About Charity fundraiser in the Last Year: Not on file  . Ran Out of Food in the Last Year: Not on file  Transportation Needs:   . Lack of Transportation (Medical): Not on file  . Lack of Transportation (Non-Medical): Not on file  Physical Activity:   . Days of Exercise per Week: Not on file  . Minutes of Exercise per Session: Not on file  Stress:   . Feeling of Stress : Not on file  Social Connections:   . Frequency of Communication with Friends and Family: Not on file  . Frequency of Social Gatherings with Friends and Family: Not on file  . Attends Religious Services: Not on file  . Active Member of Clubs or Organizations: Not on file  . Attends Archivist Meetings: Not on file  . Marital Status: Not on file  Intimate Partner Violence:   . Fear of Current or Ex-Partner: Not on file  . Emotionally Abused: Not on file  . Physically Abused: Not on file  . Sexually Abused: Not on file    Review of Systems  Gastrointestinal: Positive for nausea. Negative for abdominal pain.  All other systems reviewed and are negative.   PHYSICAL EXAMINATION:    BP 110/64   Pulse 68   Ht 5\' 5"   (1.651 m)   Wt 286 lb (129.7 kg)   SpO2 98%   BMI 47.59 kg/m     General appearance: alert, cooperative and appears stated age   Pelvic: External genitalia:  no lesions              Urethra:  normal appearing urethra with no masses, tenderness or lesions              Bartholins and Skenes: normal                 Vagina: normal appearing vagina with normal color and discharge, no lesions              Cervix: no cervical motion tenderness              Bimanual Exam:  Uterus:  normal size, contour, position, consistency, mobility, non-tender and anteverted              Adnexa: no masses, tender on the left               Chaperone was present for exam.  Pelvic ultrasound done, images reviewed with the patient. She has a 3 cm complex left ovarian cyst c/w a hemorrhagic CL, a 2 cm simple septated cyst in the left ovary, normal blood flow to her ovary. Normal uterus, IUD in place, normal right adnexa.   ASSESSMENT Pelvic pain, pain  not controlled with ibuprofen Complex left ovarian cyst c/w hemorrhagic CL. Discussed ovarian cyst, expected resolution and risk of torsion.    PLAN Oxycodone for prn use She will take ibuprofen and tylenol around the clock.  Rest, use heat, soak in the tub Call with increase in the pain or any other concerns

## 2020-07-18 NOTE — Patient Instructions (Signed)
Ovarian Cyst     An ovarian cyst is a fluid-filled sac that forms on an ovary. The ovaries are small organs that produce eggs in women. Various types of cysts can form on the ovaries. Some may cause symptoms and require treatment. Most ovarian cysts go away on their own, are not cancerous (are benign), and do not cause problems. Common types of ovarian cysts include:  Functional (follicle) cysts. ? Occur during the menstrual cycle, and usually go away with the next menstrual cycle if you do not get pregnant. ? Usually cause no symptoms.  Endometriomas. ? Are cysts that form from the tissue that lines the uterus (endometrium). ? Are sometimes called "chocolate cysts" because they become filled with blood that turns brown. ? Can cause pain in the lower abdomen during intercourse and during your period.  Cystadenoma cysts. ? Develop from cells on the outside surface of the ovary. ? Can get very large and cause lower abdomen pain and pain with intercourse. ? Can cause severe pain if they twist or break open (rupture).  Dermoid cysts. ? Are sometimes found in both ovaries. ? May contain different kinds of body tissue, such as skin, teeth, hair, or cartilage. ? Usually do not cause symptoms unless they get very big.  Theca lutein cysts. ? Occur when too much of a certain hormone (human chorionic gonadotropin) is produced and overstimulates the ovaries to produce an egg. ? Are most common after having procedures used to assist with the conception of a baby (in vitro fertilization). What are the causes? Ovarian cysts may be caused by:  Ovarian hyperstimulation syndrome. This is a condition that can develop from taking fertility medicines. It causes multiple large ovarian cysts to form.  Polycystic ovarian syndrome (PCOS). This is a common hormonal disorder that can cause ovarian cysts, as well as problems with your period or fertility. What increases the risk? The following factors may  make you more likely to develop ovarian cysts:  Being overweight or obese.  Taking fertility medicines.  Taking certain forms of hormonal birth control.  Smoking. What are the signs or symptoms? Many ovarian cysts do not cause symptoms. If symptoms are present, they may include:  Pelvic pain or pressure.  Pain in the lower abdomen.  Pain during sex.  Abdominal swelling.  Abnormal menstrual periods.  Increasing pain with menstrual periods. How is this diagnosed? These cysts are commonly found during a routine pelvic exam. You may have tests to find out more about the cyst, such as:  Ultrasound.  X-ray of the pelvis.  CT scan.  MRI.  Blood tests. How is this treated? Many ovarian cysts go away on their own without treatment. Your health care provider may want to check your cyst regularly for 2-3 months to see if it changes. If you are in menopause, it is especially important to have your cyst monitored closely because menopausal women have a higher rate of ovarian cancer. When treatment is needed, it may include:  Medicines to help relieve pain.  A procedure to drain the cyst (aspiration).  Surgery to remove the whole cyst.  Hormone treatment or birth control pills. These methods are sometimes used to help dissolve a cyst. Follow these instructions at home:  Take over-the-counter and prescription medicines only as told by your health care provider.  Do not drive or use heavy machinery while taking prescription pain medicine.  Get regular pelvic exams and Pap tests as often as told by your health care provider.    Return to your normal activities as told by your health care provider. Ask your health care provider what activities are safe for you.  Do not use any products that contain nicotine or tobacco, such as cigarettes and e-cigarettes. If you need help quitting, ask your health care provider.  Keep all follow-up visits as told by your health care provider.  This is important. Contact a health care provider if:  Your periods are late, irregular, or painful, or they stop.  You have pelvic pain that does not go away.  You have pressure on your bladder or trouble emptying your bladder completely.  You have pain during sex.  You have any of the following in your abdomen: ? A feeling of fullness. ? Pressure. ? Discomfort. ? Pain that does not go away. ? Swelling.  You feel generally ill.  You become constipated.  You lose your appetite.  You develop severe acne.  You start to have more body hair and facial hair.  You are gaining weight or losing weight without changing your exercise and eating habits.  You think you may be pregnant. Get help right away if:  You have abdominal pain that is severe or gets worse.  You cannot eat or drink without vomiting.  You suddenly develop a fever.  Your menstrual period is much heavier than usual. This information is not intended to replace advice given to you by your health care provider. Make sure you discuss any questions you have with your health care provider. Document Revised: 11/29/2017 Document Reviewed: 02/02/2016 Elsevier Patient Education  2020 Elsevier Inc.  

## 2020-07-22 ENCOUNTER — Other Ambulatory Visit: Payer: Self-pay | Admitting: Physician Assistant

## 2020-07-22 MED FILL — GABAPENTIN 300 MG CAPSULE: 300 | 30 days supply | Qty: 90 | Fill #3

## 2020-07-22 NOTE — Telephone Encounter (Signed)
Apt 12/03

## 2020-07-23 MED FILL — clonazePAM 0.5 MG TABS: 0.5 | 30 days supply | Qty: 120 | Fill #0

## 2020-07-31 DIAGNOSIS — F4310 Post-traumatic stress disorder, unspecified: Secondary | ICD-10-CM | POA: Diagnosis not present

## 2020-07-31 DIAGNOSIS — M25531 Pain in right wrist: Secondary | ICD-10-CM | POA: Diagnosis not present

## 2020-08-02 ENCOUNTER — Telehealth: Payer: 59 | Admitting: Physician Assistant

## 2020-08-02 ENCOUNTER — Other Ambulatory Visit: Payer: Self-pay | Admitting: Physician Assistant

## 2020-08-02 DIAGNOSIS — H109 Unspecified conjunctivitis: Secondary | ICD-10-CM | POA: Diagnosis not present

## 2020-08-02 MED ORDER — POLYMYXIN B-TRIMETHOPRIM 10000-0.1 UNIT/ML-% OP SOLN: OPHTHALMIC | 0 refills | Status: DC

## 2020-08-02 MED FILL — POLYMYXIN B/TMP EYE DROPS: 10000-0.1 | 5 days supply | Qty: 10 | Fill #0

## 2020-08-02 NOTE — Progress Notes (Signed)
E-Visit for Pink Eye   We are sorry that you are not feeling well.  Here is how we plan to help!  Based on what you have shared with me it looks like you have conjunctivitis.  Conjunctivitis is a common inflammatory or infectious condition of the eye that is often referred to as "pink eye".  In most cases it is contagious (viral or bacterial). However, not all conjunctivitis requires antibiotics (ex. Allergic).  We have made appropriate suggestions for you based upon your presentation.  I have prescribed Polytrim Ophthalmic drops 1-2 drops 4 times a day times 5 days  Pink eye can be highly contagious.  It is typically spread through direct contact with secretions, or contaminated objects or surfaces that one may have touched.  Strict handwashing is suggested with soap and water is urged.  If not available, use alcohol based had sanitizer.  Avoid unnecessary touching of the eye.  If you wear contact lenses, you will need to refrain from wearing them until you see no white discharge from the eye for at least 24 hours after being on medication.  You should see symptom improvement in 1-2 days after starting the medication regimen.  Call us if symptoms are not improved in 1-2 days.  Home Care:  Wash your hands often!  Do not wear your contacts until you complete your treatment plan.  Avoid sharing towels, bed linen, personal items with a person who has pink eye.  See attention for anyone in your home with similar symptoms.  Get Help Right Away If:  Your symptoms do not improve.  You develop blurred or loss of vision.  Your symptoms worsen (increased discharge, pain or redness)  Your e-visit answers were reviewed by a board certified advanced clinical practitioner to complete your personal care plan.  Depending on the condition, your plan could have included both over the counter or prescription medications.  If there is a problem please reply  once you have received a response from your  provider.  Your safety is important to us.  If you have drug allergies check your prescription carefully.    You can use MyChart to ask questions about today's visit, request a non-urgent call back, or ask for a work or school excuse for 24 hours related to this e-Visit. If it has been greater than 24 hours you will need to follow up with your provider, or enter a new e-Visit to address those concerns.   You will get an e-mail in the next two days asking about your experience.  I hope that your e-visit has been valuable and will speed your recovery. Thank you for using e-visits.     Greater than 5 minutes, yet less than 10 minutes of time have been spent researching, coordinating and implementing care for this patient today.   

## 2020-08-05 ENCOUNTER — Other Ambulatory Visit (HOSPITAL_COMMUNITY): Payer: Self-pay | Admitting: Surgery

## 2020-08-05 MED FILL — TOBRAMYCIN-DEXAMETH OPTH SU: 0.3-0.1 | 14 days supply | Qty: 10 | Fill #0

## 2020-08-07 DIAGNOSIS — F4310 Post-traumatic stress disorder, unspecified: Secondary | ICD-10-CM | POA: Diagnosis not present

## 2020-08-14 DIAGNOSIS — F4310 Post-traumatic stress disorder, unspecified: Secondary | ICD-10-CM | POA: Diagnosis not present

## 2020-08-16 ENCOUNTER — Ambulatory Visit: Payer: 59 | Admitting: Physician Assistant

## 2020-08-20 ENCOUNTER — Other Ambulatory Visit: Payer: Self-pay

## 2020-08-20 ENCOUNTER — Telehealth: Payer: Self-pay | Admitting: Physician Assistant

## 2020-08-20 NOTE — Telephone Encounter (Signed)
Pt lm requesting a refill on the Seroquel 100 mg. She is only needing a 1 mos supply. Next appt not scheduled until 1/20. This is a RS from 12/3 due to provider being out.

## 2020-08-21 ENCOUNTER — Other Ambulatory Visit: Payer: Self-pay | Admitting: Physician Assistant

## 2020-08-21 DIAGNOSIS — F4310 Post-traumatic stress disorder, unspecified: Secondary | ICD-10-CM | POA: Diagnosis not present

## 2020-08-21 MED ORDER — QUETIAPINE FUMARATE 200 MG PO TABS
100.0000 mg | ORAL_TABLET | Freq: Every day | ORAL | 0 refills | Status: DC
Start: 2020-08-21 — End: 2020-10-03

## 2020-08-21 MED FILL — FLUVOXAMINE MALEATE 100 MG: 100 | 90 days supply | Qty: 90 | Fill #1

## 2020-08-21 MED FILL — PRAZOSIN 2 MG CAPSULE: 2 | 90 days supply | Qty: 270 | Fill #1

## 2020-08-21 MED FILL — QUETIAPINE FUMARATE 200 MG: 200 | 60 days supply | Qty: 30 | Fill #0

## 2020-08-21 MED FILL — clonazePAM 0.5 MG TABS: 0.5 | 30 days supply | Qty: 120 | Fill #1

## 2020-08-21 NOTE — Telephone Encounter (Signed)
Patient normally gets 200 mg Seroquel and takes 1/2 tablet. Is there a reason she does this? Should I send that or change to 100 mg?

## 2020-08-21 NOTE — Telephone Encounter (Signed)
But stated 200 mg.  Sometimes we do have to go up.  Thanks.

## 2020-08-21 NOTE — Telephone Encounter (Signed)
Noted. Thanks. Rx sent to [facility]

## 2020-08-22 MED FILL — METFORMIN HCL 1000 MG TABS: 1000 | 30 days supply | Qty: 30 | Fill #5

## 2020-08-22 MED FILL — TROKENDI XR 100 MG CAPSULE: 100 | 30 days supply | Qty: 30 | Fill #5

## 2020-08-28 DIAGNOSIS — F4310 Post-traumatic stress disorder, unspecified: Secondary | ICD-10-CM | POA: Diagnosis not present

## 2020-09-03 ENCOUNTER — Other Ambulatory Visit: Payer: Self-pay | Admitting: Physician Assistant

## 2020-09-03 ENCOUNTER — Telehealth: Payer: Self-pay | Admitting: Physician Assistant

## 2020-09-03 MED ORDER — GABAPENTIN 300 MG PO CAPS: 600.0000 mg | ORAL_CAPSULE | Freq: Every day | ORAL | 1 refills | Status: DC

## 2020-09-03 MED FILL — GABAPENTIN 300 MG CAPSULE: 300 | 90 days supply | Qty: 180 | Fill #0

## 2020-09-03 NOTE — Telephone Encounter (Signed)
Pt requesting Rx for Gabapentin 300 mg caps 2 @ bedtime @ [facility] [phone] Apt 1/20. Pt contact # [phone]

## 2020-09-03 NOTE — Telephone Encounter (Signed)
Prescription was sent

## 2020-09-16 ENCOUNTER — Emergency Department (HOSPITAL_BASED_OUTPATIENT_CLINIC_OR_DEPARTMENT_OTHER)
Admission: EM | Admit: 2020-09-16 | Discharge: 2020-09-17 | Disposition: A | Discharge status: Home / Self Care | Payer: 59 | Attending: Emergency Medicine | Admitting: Emergency Medicine

## 2020-09-16 ENCOUNTER — Encounter (HOSPITAL_BASED_OUTPATIENT_CLINIC_OR_DEPARTMENT_OTHER): Payer: Self-pay

## 2020-09-16 ENCOUNTER — Other Ambulatory Visit: Payer: Self-pay

## 2020-09-16 DIAGNOSIS — E119 Type 2 diabetes mellitus without complications: Secondary | ICD-10-CM | POA: Diagnosis not present

## 2020-09-16 DIAGNOSIS — R1032 Left lower quadrant pain: Secondary | ICD-10-CM | POA: Insufficient documentation

## 2020-09-16 DIAGNOSIS — K219 Gastro-esophageal reflux disease without esophagitis: Secondary | ICD-10-CM | POA: Insufficient documentation

## 2020-09-16 DIAGNOSIS — R109 Unspecified abdominal pain: Secondary | ICD-10-CM | POA: Diagnosis not present

## 2020-09-16 DIAGNOSIS — R110 Nausea: Secondary | ICD-10-CM | POA: Insufficient documentation

## 2020-09-16 DIAGNOSIS — Z87891 Personal history of nicotine dependence: Secondary | ICD-10-CM | POA: Insufficient documentation

## 2020-09-16 DIAGNOSIS — Z91040 Latex allergy status: Secondary | ICD-10-CM | POA: Insufficient documentation

## 2020-09-16 DIAGNOSIS — Z7984 Long term (current) use of oral hypoglycemic drugs: Secondary | ICD-10-CM | POA: Insufficient documentation

## 2020-09-16 LAB — URINALYSIS, ROUTINE W REFLEX MICROSCOPIC
Bilirubin Urine: NEGATIVE
Glucose, UA: NEGATIVE mg/dL
Hgb urine dipstick: NEGATIVE
Ketones, ur: NEGATIVE mg/dL
Leukocytes,Ua: NEGATIVE
Nitrite: NEGATIVE
Protein, ur: NEGATIVE mg/dL
Specific Gravity, Urine: 1.01 (ref 1.005–1.030)
pH: 7.5 (ref 5.0–8.0)

## 2020-09-16 LAB — CBC
HCT: 41.1 % (ref 36.0–46.0)
Hemoglobin: 12.8 g/dL (ref 12.0–15.0)
MCH: 27.2 pg (ref 26.0–34.0)
MCHC: 31.1 g/dL (ref 30.0–36.0)
MCV: 87.4 fL (ref 80.0–100.0)
Platelets: 259 10*3/uL (ref 150–400)
RBC: 4.7 MIL/uL (ref 3.87–5.11)
RDW: 14.5 % (ref 11.5–15.5)
WBC: 8.7 10*3/uL (ref 4.0–10.5)
nRBC: 0 % (ref 0.0–0.2)

## 2020-09-16 LAB — COMPREHENSIVE METABOLIC PANEL
ALT: 16 U/L (ref 0–44)
AST: 24 U/L (ref 15–41)
Albumin: 3.8 g/dL (ref 3.5–5.0)
Alkaline Phosphatase: 80 U/L (ref 38–126)
Anion gap: 10 (ref 5–15)
BUN: 15 mg/dL (ref 6–20)
CO2: 23 mmol/L (ref 22–32)
Calcium: 8.8 mg/dL — ABNORMAL LOW (ref 8.9–10.3)
Chloride: 105 mmol/L (ref 98–111)
Creatinine, Ser: 0.91 mg/dL (ref 0.44–1.00)
GFR, Estimated: >60 mL/min (ref >60)
Glucose, Bld: 82 mg/dL (ref 70–99)
Potassium: 4.2 mmol/L (ref 3.5–5.1)
Sodium: 138 mmol/L (ref 135–145)
Total Bilirubin: <0.1 mg/dL — ABNORMAL LOW (ref 0.3–1.2)
Total Protein: 8.2 g/dL — ABNORMAL HIGH (ref 6.5–8.1)

## 2020-09-16 LAB — LIPASE, BLOOD: Lipase: 49 U/L (ref 11–51)

## 2020-09-16 LAB — PREGNANCY, URINE: Preg Test, Ur: NEGATIVE

## 2020-09-16 MED ORDER — MORPHINE SULFATE (PF) 4 MG/ML IV SOLN
4.0000 mg | Freq: Once | INTRAVENOUS | Status: AC
  Administered 2020-09-17: 4 mg via INTRAVENOUS
  Filled 2020-09-16: qty 1

## 2020-09-16 MED ORDER — ONDANSETRON HCL 4 MG/2ML IJ SOLN
4.0000 mg | Freq: Once | INTRAMUSCULAR | Status: AC
  Administered 2020-09-17: 4 mg via INTRAVENOUS
  Filled 2020-09-16: qty 2

## 2020-09-16 MED ORDER — SODIUM CHLORIDE 0.9 % IV BOLUS
1000.0000 mL | Freq: Once | INTRAVENOUS | Status: AC
  Administered 2020-09-17: 1000 mL via INTRAVENOUS

## 2020-09-16 NOTE — ED Triage Notes (Signed)
Pt arrives complaining of bilateral lower abdominal pain, worse on left side. Was seen in December and was told had and "ovarian cyst that was bleeding into itself". Was told to come back if pain occurred again.

## 2020-09-16 NOTE — ED Provider Notes (Signed)
[facility] EMERGENCY DEPARTMENT Provider Note   CSN: [id number] Arrival date & time: 09/16/20  1825     History Chief Complaint  Patient presents with  . Abdominal Pain    Caroline Gonzales is a 45 y.o. female.  Patient is a 45 year old female with past medical history of anxiety, bipolar, fibroids, and hemorrhagic ovarian cyst diagnosed in November. Patient presents today with left lower quadrant and suprapubic abdominal pain that started 3 days ago. Pain has worsened this morning. She describes a constant pain to the left lower quadrant with no associated diarrhea, constipation, vaginal bleeding, fevers. She has felt somewhat nauseated but has not vomited. This feels similar to what she experienced with her prior ovarian cyst.  The history is provided by the patient.  Abdominal Pain Pain location:  LLQ and suprapubic Pain quality: cramping   Pain radiates to:  Does not radiate Pain severity:  Moderate Onset quality:  Sudden Timing:  Constant Progression:  Worsening Relieved by:  Nothing Worsened by:  Movement and palpation      Past Medical History:  Diagnosis Date  . Allergy    Zyrtec, Allegra.  . Anxiety    followed by Dr. Toy Cookey  . Back pain   . Bipolar 1 disorder ([place])   . Complication of anesthesia   . Depression   . Fibroid   . Gallbladder problem   . Gastroparesis 09/14/2012   gastric emptying study in 2014  . GERD (gastroesophageal reflux disease)   . Headache   . Pneumonia    2013ish  . PONV (postoperative nausea and vomiting)   . Pre-diabetes   . PTSD (post-traumatic stress disorder)   . Sleep apnea    do not use a cpap  . Tinnitus   . Vitamin D deficiency   . Wears glasses     Patient Active Problem List   Diagnosis Date Noted  . PTSD (post-traumatic stress disorder) 07/21/2018  . GAD (generalized anxiety disorder) 07/21/2018  . Insomnia 07/21/2018  . MDD (major depressive disorder) 07/21/2018  . Nightmares 07/21/2018  . Insulin  resistance 10/21/2017  . Other hyperlipidemia 10/07/2017  . Vitamin D deficiency 10/07/2017  . Bipolar depression ([place]) 04/27/2017  . Class 3 obesity with serious comorbidity and body mass index (BMI) of 40.0 to 44.9 in adult 04/27/2017  . Residual foreign body in soft tissue 11/12/2016  . Prediabetes 11/02/2016  . Morbid obesity ([place]) 10/15/2016  . Bipolar 1 disorder [facility])     Past Surgical History:  Procedure Laterality Date  . ANKLE ARTHROSCOPY Left 2011  . CESAREAN SECTION    . CHOLECYSTECTOMY    . DILATION AND CURETTAGE OF UTERUS    . FOREIGN BODY REMOVAL Left 11/18/2016   Procedure: REMOVAL FOREIGN BODY EXTREMITY LEFT FOOT;  Surgeon: Trula Slade, DPM;  Location: [facility];  Service: Podiatry;  Laterality: Left;  . PILONIDAL CYST EXCISION  1990's  . RADIOLOGY WITH ANESTHESIA N/A 11/10/2018   Procedure: MRI WITH ANESTHESIA OF BRAIN AND ORBITS WITH AND WITHOUT CONTRAST;  Surgeon: Radiologist, Medication, MD;  Location: [facility];  Service: Radiology;  Laterality: N/A;  . TENDON REPAIR Left 2011   Left Ankle  . WISDOM TOOTH EXTRACTION  19090's     OB History    Gravida  3   Para  1   Term  1   Preterm  0   AB  0   Living  0     SAB  0   IAB  0  Ectopic  0   Multiple  0   Live Births  0           Family History  Problem Relation Age of Onset  . Cancer Mother        squamous cell carcinoma  . Hyperlipidemia Mother   . Depression Mother   . Anxiety disorder Mother   . Obesity Mother   . Heart disease Father 34       cardiomegaly, CHF; steroid use.  [name] Hyperlipidemia Father   . Hypertension Father   . Mental retardation Father   . High blood pressure Father   . Depression Father   . Anxiety disorder Father   . Obesity Father   . Cancer Maternal Grandmother   . Diabetes Maternal Grandmother   . Heart disease Maternal Grandmother   . Hyperlipidemia Maternal Grandmother   . Hypertension Maternal Grandmother   . Stroke Maternal Grandmother   . Heart  disease Paternal Grandmother   . Hypertension Paternal Grandmother   . Heart disease Paternal Grandfather   . Hyperlipidemia Paternal Grandfather   . Mental illness Paternal Grandfather   . Rheum arthritis Sister   . Multiple sclerosis Sister   . Breast cancer Maternal Aunt   . Thyroid cancer Paternal Aunt     Social History   Tobacco Use  . Smoking status: Former Smoker    Years: 10.00    Types: Cigarettes    Quit date: 2003    Years since quitting: 19.0  . Smokeless tobacco: Never Used  Vaping Use  . Vaping Use: Never used  Substance Use Topics  . Alcohol use: Yes    Alcohol/week: 0.0 standard drinks    Comment: every few months  . Drug use: No    Home Medications Prior to Admission medications   Medication Sig Start Date End Date Taking? Authorizing Provider  Cholecalciferol (VITAMIN D3) 5000 units CAPS Take 5,000 Units by mouth at bedtime.    [provider]  clonazePAM (KLONOPIN) 0.5 MG tablet TAKE 1 TABLET BY MOUTH EVERY 6 HOURS AS NEEDED FOR ANXIETY. 07/22/20   Melony Overly T, PA-C  fluvoxaMINE (LUVOX) 100 MG tablet Take 1 tablet (100 mg total) by mouth at bedtime. 05/17/20   Melony Overly T, PA-C  gabapentin (NEURONTIN) 300 MG capsule Take 2 capsules (600 mg total) by mouth at bedtime. 09/03/20   Melony Overly T, PA-C  ibuprofen (ADVIL,MOTRIN) 200 MG tablet Take 400 mg by mouth daily as needed for headache or moderate pain.    [provider]  levonorgestrel (MIRENA) 20 MCG/24HR IUD 1 each by Intrauterine route once.     [provider]  metFORMIN (GLUCOPHAGE) 1000 MG tablet Take 1 tablet (1,000 mg total) by mouth daily with breakfast. 01/18/20 07/16/20  Langston Reusing, MD  Multiple Vitamin (MULTIVITAMIN) tablet Take 1 tablet by mouth at bedtime.     [provider]  NEXIUM 20 MG packet Take by mouth at bedtime. 10/03/19   [provider]  oxycodone (OXY-IR) 5 MG capsule Take 1 capsule (5 mg total) by mouth every 4 (four)  hours as needed. 07/18/20   Romualdo Bolk, MD  prazosin (MINIPRESS) 2 MG capsule Take 3 capsules (6 mg total) by mouth at bedtime. 05/17/20   Melony Overly T, PA-C  propranolol (INDERAL) 40 MG tablet TAKE 1 TABLET BY MOUTH 2 TIMES DAILY. 06/14/20   Melony Overly T, PA-C  QUEtiapine (SEROQUEL) 200 MG tablet Take 0.5 tablets (100 mg total) by mouth  at bedtime. 08/21/20   Addison Lank, PA-C  Topiramate ER (TROKENDI XR) 100 MG CP24 Take 100 mg by mouth daily. 01/18/20   Laqueta Linden, MD  trimethoprim-polymyxin b [facility]) ophthalmic solution Apply 1-2 drops 4 times a day times 5 days 08/02/20   McVey, Gelene Mink, PA-C    Allergies    Penicillins, Tamiflu [oseltamivir phosphate], Tamiflu [oseltamivir phosphate], Cephalosporins, and Latex  Review of Systems   Review of Systems  Gastrointestinal: Positive for abdominal pain.  All other systems reviewed and are negative.   Physical Exam Updated Vital Signs BP 126/85 (BP Location: Right Arm)   Pulse 73   Temp 98.3 F (36.8 C) (Oral)   Resp 16   Ht 5\' 6"  (1.676 m)   Wt 124.7 kg   SpO2 100%   BMI 44.39 kg/m   Physical Exam Vitals and nursing note reviewed.  Constitutional:      General: She is not in acute distress.    Appearance: She is well-developed and well-nourished. She is not diaphoretic.  HENT:     Head: Normocephalic and atraumatic.  Cardiovascular:     Rate and Rhythm: Normal rate and regular rhythm.     Heart sounds: No murmur heard. No friction rub. No gallop.   Pulmonary:     Effort: Pulmonary effort is normal. No respiratory distress.     Breath sounds: Normal breath sounds. No wheezing.  Abdominal:     General: Bowel sounds are normal. There is no distension.     Palpations: Abdomen is soft.     Tenderness: There is abdominal tenderness in the suprapubic area and left lower quadrant. There is no right CVA tenderness, left CVA tenderness, guarding or rebound.  Musculoskeletal:        General:  Normal range of motion.     Cervical back: Normal range of motion and neck supple.  Skin:    General: Skin is warm and dry.  Neurological:     Mental Status: She is alert and oriented to person, place, and time.     ED Results / Procedures / Treatments   Labs (all labs ordered are listed, but only abnormal results are displayed) Labs Reviewed  COMPREHENSIVE METABOLIC PANEL - Abnormal; Notable for the following components:      Result Value   Calcium 8.8 (*)    Total Protein 8.2 (*)    Total Bilirubin <0.1 (*)    All other components within normal limits  LIPASE, BLOOD  CBC  URINALYSIS, ROUTINE W REFLEX MICROSCOPIC  PREGNANCY, URINE    EKG None  Radiology No results found.  Procedures Procedures (including critical care time)  Medications Ordered in ED Medications  morphine 4 MG/ML injection 4 mg (has no administration in time range)  sodium chloride 0.9 % bolus 1,000 mL (has no administration in time range)  ondansetron (ZOFRAN) injection 4 mg (has no administration in time range)    ED Course  I have reviewed the triage vital signs and the nursing notes.  Pertinent labs & imaging results that were available during my care of the patient were reviewed by me and considered in my medical decision making (see chart for details).    MDM Rules/Calculators/A&P  Patient is a 45 year old female presenting with complaints of abdominal pain.  She describes pain to the left lower quadrant and suprapubic region that feels similar to what she felt with a hemorrhagic ovarian cyst 2 months ago.  Patient's laboratory studies are reassuring and CT of  the abdomen shows no acute process.  Her pain has been ongoing for 3 days.  I doubt torsion.  Patient tells me she can follow-up with her GYN this morning.  I feel as though discharge with follow-up with them is appropriate.  Final Clinical Impression(s) / ED Diagnoses Final diagnoses:  None    Rx / DC Orders ED Discharge Orders     None       Veryl Speak, MD 09/17/20 [phone]

## 2020-09-17 ENCOUNTER — Other Ambulatory Visit: Payer: Self-pay

## 2020-09-17 ENCOUNTER — Emergency Department (HOSPITAL_BASED_OUTPATIENT_CLINIC_OR_DEPARTMENT_OTHER): Payer: 59

## 2020-09-17 ENCOUNTER — Telehealth: Payer: Self-pay

## 2020-09-17 DIAGNOSIS — E119 Type 2 diabetes mellitus without complications: Secondary | ICD-10-CM | POA: Diagnosis not present

## 2020-09-17 DIAGNOSIS — R1032 Left lower quadrant pain: Secondary | ICD-10-CM | POA: Diagnosis not present

## 2020-09-17 DIAGNOSIS — Z87891 Personal history of nicotine dependence: Secondary | ICD-10-CM | POA: Diagnosis not present

## 2020-09-17 DIAGNOSIS — K219 Gastro-esophageal reflux disease without esophagitis: Secondary | ICD-10-CM | POA: Diagnosis not present

## 2020-09-17 DIAGNOSIS — R109 Unspecified abdominal pain: Secondary | ICD-10-CM | POA: Diagnosis not present

## 2020-09-17 DIAGNOSIS — Z91040 Latex allergy status: Secondary | ICD-10-CM | POA: Diagnosis not present

## 2020-09-17 DIAGNOSIS — Z7984 Long term (current) use of oral hypoglycemic drugs: Secondary | ICD-10-CM | POA: Diagnosis not present

## 2020-09-17 DIAGNOSIS — R110 Nausea: Secondary | ICD-10-CM | POA: Diagnosis not present

## 2020-09-17 MED ORDER — IOHEXOL 300 MG/ML  SOLN
100.0000 mL | Freq: Once | INTRAMUSCULAR | Status: AC | PRN
  Administered 2020-09-17: 100 mL via INTRAVENOUS

## 2020-09-17 NOTE — Telephone Encounter (Signed)
Spoke with patient and informed her of Dr. Salli Quarry reply.  She knows someone from our appointment desk will be calling her back to arrange an u/s and visit.

## 2020-09-17 NOTE — Discharge Instructions (Addendum)
Take ibuprofen 600 mg every 6 hours as needed for pain.  Call your GYN this morning to make arrangements for follow-up.  Return to the ER for high fever, worsening pain, or other new and concerning symptoms.

## 2020-09-17 NOTE — Telephone Encounter (Signed)
Please let the patient know that I have reviewed the ER note and CT report. The CT reported multiple follicles in the left ovary. Ultrasound would be better able to access her ovaries. Please set her up for an ultrasound and visit with me this week. Thanks!

## 2020-09-17 NOTE — Telephone Encounter (Signed)
Patient was seen in the ER last night until 2:30am with abd pain that favored the left side, that reminded her of the ovarian cyst she had in November. She said ultrasound was not available but they did do a CT scan and that result is available in her imaging folder in Epic. Pain started this past Saturday. She does have IUD in place and questions could she be starting to ovulate since that last episode was 4 1/2 weeks ago. Pain level is a 3 today. She is resting. She said that ER told her to call here and follow up with her provider today.  Please advise. Thanks

## 2020-09-18 DIAGNOSIS — F4310 Post-traumatic stress disorder, unspecified: Secondary | ICD-10-CM | POA: Diagnosis not present

## 2020-09-19 ENCOUNTER — Ambulatory Visit (INDEPENDENT_AMBULATORY_CARE_PROVIDER_SITE_OTHER): Payer: 59

## 2020-09-19 ENCOUNTER — Ambulatory Visit: Payer: 59 | Admitting: Obstetrics and Gynecology

## 2020-09-19 ENCOUNTER — Other Ambulatory Visit: Payer: Self-pay

## 2020-09-19 ENCOUNTER — Other Ambulatory Visit: Payer: Self-pay | Admitting: Obstetrics and Gynecology

## 2020-09-19 ENCOUNTER — Encounter: Payer: Self-pay | Admitting: Obstetrics and Gynecology

## 2020-09-19 VITALS — BP 120/70 | HR 76 | Resp 18 | Ht 65.0 in | Wt 286.0 lb

## 2020-09-19 DIAGNOSIS — R1032 Left lower quadrant pain: Secondary | ICD-10-CM

## 2020-09-19 DIAGNOSIS — R102 Pelvic and perineal pain: Secondary | ICD-10-CM | POA: Diagnosis not present

## 2020-09-19 DIAGNOSIS — N941 Unspecified dyspareunia: Secondary | ICD-10-CM | POA: Diagnosis not present

## 2020-09-19 DIAGNOSIS — Z3009 Encounter for other general counseling and advice on contraception: Secondary | ICD-10-CM

## 2020-09-19 DIAGNOSIS — Z30432 Encounter for removal of intrauterine contraceptive device: Secondary | ICD-10-CM | POA: Diagnosis not present

## 2020-09-19 MED ORDER — LO LOESTRIN FE 1 MG-10 MCG / 10 MCG PO TABS: 1.0000 | ORAL_TABLET | Freq: Every day | ORAL | 0 refills | Status: DC

## 2020-09-19 MED FILL — LO LOESTRIN FE 1-10 TABLET: 1 MG-10 MCG | 28 days supply | Qty: 28 | Fill #0

## 2020-09-19 NOTE — Patient Instructions (Signed)
Oral Contraception Information Oral contraceptive pills (OCPs) are medicines taken to prevent pregnancy. OCPs are taken by mouth, and they work by:  Preventing the ovaries from releasing eggs.  Thickening mucus in the lower part of the uterus (cervix), which prevents sperm from entering the uterus.  Thinning the lining of the uterus (endometrium), which prevents a fertilized egg from attaching to the endometrium. OCPs are highly effective when taken exactly as prescribed. However, OCPs do not prevent STIs (sexually transmitted infections). Safe sex practices, such as using condoms while on an OCP, can help prevent STIs. Before starting OCPs Before you start taking OCPs, you may have a physical exam, blood test, and Pap test. However, you are not required to have a pelvic exam in order to be prescribed OCPs. Your health care provider will make sure you are a good candidate for oral contraception. OCPs are not a good option for certain women, including women who smoke and are older than 35 years, and women with a medical history of high blood pressure, deep vein thrombosis, pulmonary embolism, stroke, cardiovascular disease, or peripheral vascular disease. Discuss with your health care provider the possible side effects of the OCP you may be prescribed. When you start an OCP, be aware that it can take 2-3 months for your body to adjust to changes in hormone levels. Follow instructions from your health care provider about how to start taking your first cycle of OCPs. Depending on when you start the pill, you may need to use a backup form of birth control, such as condoms, during the first week. Make sure you know what steps to take if you ever forget to take the pill. Types of oral contraception  The most common types of birth control pills contain the hormones estrogen and progestin (synthetic progesterone) or progestin only. The combination pill This type of pill contains estrogen and progestin  hormones. Combination pills often come in packs of 21, 28, or 91 pills. For each pack, the last 7 pills may not contain hormones, which means you may stop taking the pills for 7 days. Menstrual bleeding occurs during the week that you do not take the pills or that you take the pills with no hormones in them. The minipill This type of pill contains the progestin hormone only. It comes in packs of 28 pills. All 28 pills contain the hormone. You take the pill every day. It is very important to take the pill at the same time each day. Advantages of oral contraceptive pills  Provides reliable and continuous contraception if taken as instructed.  May treat or decrease symptoms of: ? Menstrual period cramps. ? Irregular menstrual cycle or bleeding. ? Heavy menstrual flow. ? Abnormal uterine bleeding. ? Acne, depending on the type of pill. ? Polycystic ovarian syndrome. ? Endometriosis. ? Iron deficiency anemia. ? Premenstrual symptoms, including premenstrual dysphoric disorder.  May reduce the risk of endometrial and ovarian cancer.  Can be used as emergency contraception.  Prevents mislocated (ectopic) pregnancies and infections of the fallopian tubes. Things that can make oral contraceptive pills less effective OCPs can be less effective if:  You forget to take the pill at the same time every day. This is especially important when taking the minipill.  You have a stomach or intestinal disease that reduces your body's ability to absorb the pill.  You take OCPs with other medicines that make OCPs less effective, such as antibiotics, certain HIV medicines, and some seizure medicines.  You take expired OCPs.    You forget to restart the pill on day 7, if using the packs of 21 pills. Risks associated with oral contraceptive pills Oral contraceptive pills can sometimes cause side effects, such as:  Headache.  Depression.  Trouble sleeping.  Nausea and vomiting.  Breast  tenderness.  Irregular bleeding or spotting during the first several months.  Bloating or fluid retention.  Increase in blood pressure. Combination pills are also associated with a small increase in the risk of:  Blood clots.  Heart attack.  Stroke. Summary  Oral contraceptive pills are medicines taken by mouth to prevent pregnancy. They are highly effective when taken exactly as prescribed.  The most common types of birth control pills contain the hormones estrogen and progestin (synthetic progesterone) or progestin only.  Before you start taking the pill, you may have a physical exam, blood test, and Pap test. Your health care provider will make sure you are a good candidate for oral contraception.  The combination pill may come in a 21-day pack, a 28-day pack, or a 91-day pack. The minipill contains the progesterone hormone only and comes in packs of 28 pills.  Oral contraceptive pills can sometimes cause side effects, such as headache, nausea, breast tenderness, or irregular bleeding. This information is not intended to replace advice given to you by your health care provider. Make sure you discuss any questions you have with your health care provider. Document Revised: 08/13/2017 Document Reviewed: 11/24/2016 Elsevier Patient Education  2020 [employer]  

## 2020-09-19 NOTE — Progress Notes (Signed)
GYNECOLOGY  VISIT   HPI: 45 y.o.   Married White or Caucasian Not Hispanic or Latino  female   G3P1000 with No LMP recorded. (Menstrual status: IUD).   here for evaluation of pelvic pain. She was seen 2 months ago with pelvic pain and was noted to have a complex left ovarian cyst c/w a hemorrhagic CL.  She was seen in the ER 3 days ago with a 3 day h/o LLQ abdominal/pelvic pain. CT showed multiple left ovarian "follicles", no acute process was seen.  Pain is currently a 3/10 in severity, stabbing and intermittent (down from a 9/10 in severity). Nausea with the pain, no emesis, no diarrhea or constipation.  Since 11/21 she has had cyclic pain in the LLQ/left pelvis. The pain was very severe when she was seen in the ER on 09/16/20.  She has been pain after orgasm for the last few months. No STD concerns.   Mirena IUD placed in 7/19. No cycles, she has been having some spotting. She has spotted off and on since 11/21, has spotted for weeks at a time.   Prior to the IUD she was on Lo Loestrin. Didn't tolerate higher dose pills or the nuvaring. Had nausea.   GYNECOLOGIC HISTORY: No LMP recorded. (Menstrual status: IUD). Contraception:IUD Menopausal hormone therapy: none        OB History    Gravida  3   Para  1   Term  1   Preterm  0   AB  0   Living  0     SAB  0   IAB  0   Ectopic  0   Multiple  0   Live Births  0              Patient Active Problem List   Diagnosis Date Noted  . PTSD (post-traumatic stress disorder) 07/21/2018  . GAD (generalized anxiety disorder) 07/21/2018  . Insomnia 07/21/2018  . MDD (major depressive disorder) 07/21/2018  . Nightmares 07/21/2018  . Insulin resistance 10/21/2017  . Other hyperlipidemia 10/07/2017  . Vitamin D deficiency 10/07/2017  . Bipolar depression (HCC) 04/27/2017  . Class 3 obesity with serious comorbidity and body mass index (BMI) of 40.0 to 44.9 in adult 04/27/2017  . Residual foreign body in soft tissue  11/12/2016  . Prediabetes 11/02/2016  . Morbid obesity (HCC) 10/15/2016  . Bipolar 1 disorder (HCC)     Past Medical History:  Diagnosis Date  . Allergy    Zyrtec, Allegra.  . Anxiety    followed by Dr. Toni Arthurs  . Back pain   . Bipolar 1 disorder (HCC)   . Complication of anesthesia   . Depression   . Fibroid   . Gallbladder problem   . Gastroparesis 09/14/2012   gastric emptying study in 2014  . GERD (gastroesophageal reflux disease)   . Headache   . Pneumonia    2013ish  . PONV (postoperative nausea and vomiting)   . Pre-diabetes   . PTSD (post-traumatic stress disorder)   . Sleep apnea    do not use a cpap  . Tinnitus   . Vitamin D deficiency   . Wears glasses     Past Surgical History:  Procedure Laterality Date  . ANKLE ARTHROSCOPY Left 2011  . CESAREAN SECTION    . CHOLECYSTECTOMY    . DILATION AND CURETTAGE OF UTERUS    . FOREIGN BODY REMOVAL Left 11/18/2016   Procedure: REMOVAL FOREIGN BODY EXTREMITY LEFT FOOT;  Surgeon: Molli Hazard  Lynwood Dawley, DPM;  Location: [place];  Service: Podiatry;  Laterality: Left;  . PILONIDAL CYST EXCISION  1990's  . RADIOLOGY WITH ANESTHESIA N/A 11/10/2018   Procedure: MRI WITH ANESTHESIA OF BRAIN AND ORBITS WITH AND WITHOUT CONTRAST;  Surgeon: Radiologist, Medication, MD;  Location: [place];  Service: Radiology;  Laterality: N/A;  . TENDON REPAIR Left 2011   Left Ankle  . WISDOM TOOTH EXTRACTION  [id number]'s    Current Outpatient Medications  Medication Sig Dispense Refill  . Cholecalciferol (VITAMIN D3) 5000 units CAPS Take 5,000 Units by mouth at bedtime.    . clonazePAM (KLONOPIN) 0.5 MG tablet TAKE 1 TABLET BY MOUTH EVERY 6 HOURS AS NEEDED FOR ANXIETY. 120 tablet 1  . fluvoxaMINE (LUVOX) 100 MG tablet Take 1 tablet (100 mg total) by mouth at bedtime. 90 tablet 1  . gabapentin (NEURONTIN) 300 MG capsule Take 2 capsules (600 mg total) by mouth at bedtime. 180 capsule 1  . ibuprofen (ADVIL,MOTRIN) 200 MG tablet Take 400 mg by mouth daily as  needed for headache or moderate pain.    [name] levonorgestrel (MIRENA) 20 MCG/24HR IUD 1 each by Intrauterine route once.     . metFORMIN (GLUCOPHAGE) 1000 MG tablet Take 1 tablet (1,000 mg total) by mouth daily with breakfast. 30 tablet 5  . Multiple Vitamin (MULTIVITAMIN) tablet Take 1 tablet by mouth at bedtime.     [name] NEXIUM 20 MG packet Take by mouth at bedtime.    . prazosin (MINIPRESS) 2 MG capsule Take 3 capsules (6 mg total) by mouth at bedtime. 270 capsule 1  . propranolol (INDERAL) 40 MG tablet TAKE 1 TABLET BY MOUTH 2 TIMES DAILY. 180 tablet 0  . QUEtiapine (SEROQUEL) 200 MG tablet Take 0.5 tablets (100 mg total) by mouth at bedtime. 30 tablet 0  . Topiramate ER (TROKENDI XR) 100 MG CP24 Take 100 mg by mouth daily. 30 capsule 5  . oxycodone (OXY-IR) 5 MG capsule Take 1 capsule (5 mg total) by mouth every 4 (four) hours as needed. (Patient not taking: Reported on 09/19/2020) 15 capsule 0   No current facility-administered medications for this visit.   No HTN issues, above meds are for psychiatric problems.   ALLERGIES: Penicillins, Tamiflu [oseltamivir phosphate], Tamiflu [oseltamivir phosphate], Cephalosporins, and Latex  Family History  Problem Relation Age of Onset  . Cancer Mother        squamous cell carcinoma  . Hyperlipidemia Mother   . Depression Mother   . Anxiety disorder Mother   . Obesity Mother   . Heart disease Father 85       cardiomegaly, CHF; steroid use.  [name] Hyperlipidemia Father   . Hypertension Father   . Mental retardation Father   . High blood pressure Father   . Depression Father   . Anxiety disorder Father   . Obesity Father   . Cancer Maternal Grandmother   . Diabetes Maternal Grandmother   . Heart disease Maternal Grandmother   . Hyperlipidemia Maternal Grandmother   . Hypertension Maternal Grandmother   . Stroke Maternal Grandmother   . Heart disease Paternal Grandmother   . Hypertension Paternal Grandmother   . Heart disease Paternal Grandfather    . Hyperlipidemia Paternal Grandfather   . Mental illness Paternal Grandfather   . Rheum arthritis Sister   . Multiple sclerosis Sister   . Breast cancer Maternal Aunt   . Thyroid cancer Paternal Aunt     Social History   Socioeconomic History  . Marital status:  Married    Spouse name: Not on file  . Number of children: Not on file  . Years of education: Not on file  . Highest education level: Not on file  Occupational History  . Occupation: Programmer, multimedia: [employer]  Tobacco Use  . Smoking status: Former Smoker    Years: 10.00    Types: Cigarettes    Quit date: 2003    Years since quitting: 19.0  . Smokeless tobacco: Never Used  Vaping Use  . Vaping Use: Never used  Substance and Sexual Activity  . Alcohol use: Yes    Alcohol/week: 0.0 standard drinks    Comment: every few months  . Drug use: No  . Sexual activity: Yes    Partners: Male    Birth control/protection: I.U.D.    Comment: Mirena  Other Topics Concern  . 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      Seatbelt: 100%; no texting while driving often       Social Determinants of Radio broadcast assistant Strain: Not on file  Food Insecurity: Not on file  Transportation Needs: Not on file  Physical Activity: Not on file  Stress: Not on file  Social Connections: Not on file  Intimate Partner Violence: Not on file    Review of Systems  Constitutional: Negative.   HENT: Negative.   Eyes: Negative.   Respiratory: Negative.   Cardiovascular: Negative.   Gastrointestinal: Negative.   Genitourinary: Negative.   Musculoskeletal: Negative.   Skin: Negative.   Neurological:  Negative.   Endo/Heme/Allergies: Negative.   Psychiatric/Behavioral: Negative.     PHYSICAL EXAMINATION:    BP 120/70 (BP Location: Right Arm, Patient Position: Sitting, Cuff Size: Large)   Pulse 76   Resp 18   Ht 5\' 5"  (1.651 m)   Wt 286 lb (129.7 kg)   BMI 47.59 kg/m     General appearance: alert, cooperative and appears stated age Abdomen: soft, tender in the LLQ, no rebound, no guarding; non distended, no masses,  no organomegaly  Pelvic: External genitalia:  no lesions              Urethra:  normal appearing urethra with no masses, tenderness or lesions              Bartholins and Skenes: normal                 Vagina: normal appearing vagina with normal color and discharge, no lesions              Cervix: no lesions and IUD string 4 cm. IUD removed with ringed forceps              Bimanual Exam:  Uterus:  normal size, contour, position, consistency, mobility, non-tender and anteverted              Adnexa: no masses, tender on the left               Chaperone was present for exam.  Ultrasound images were reviewed with the patient. Pelvic ultrasound: Uterus 8.25 x 3.86 x 4.7 cm Endometrial stripe 2.03 mm, IUD in the endometrial cavity Left ovary 2.74 x 3.08 x 2.76 cm, 1.7 cm collapsed corpus luteum, one other follicle 1.5 cm Right ovary 3.33 x 1.95 x 2.09 cm  Impression: Normal anteverted uterus with thin endometrial stripe, IUD in place Normal adnexa bilaterally No free fluid.    1. Pelvic pain, cyclic for the last few months Ultrasound today without abnormal findings. Tender with palpation of the left adnexa.   2. General counseling and advice on female contraception Discussed pulling the IUD and starting OCP's, she desires to do this. No contraindications to OCP's, risks reviewed.  - Norethindrone-Ethinyl Estradiol-Fe Biphas (LO LOESTRIN FE) 1 MG-10 MCG / 10 MCG tablet; Take 1 tablet by mouth daily.  Dispense: 84 tablet; Refill: 0 F/U in 3 months (will change  her annual exam)  3. Encounter for IUD removal Given her cyclic pain and now pain after intercourse, will remove her IUD  4. Dyspareunia, female Normal ultrasound Will remove IUD, f/u in 3 months to see if symptoms improve.    In addition to removal of the IUD and review of the ultrasound, over 20 minutes was spent in total patient care in regards to management.

## 2020-09-20 ENCOUNTER — Encounter: Payer: Self-pay | Admitting: Obstetrics and Gynecology

## 2020-09-25 DIAGNOSIS — F4310 Post-traumatic stress disorder, unspecified: Secondary | ICD-10-CM | POA: Diagnosis not present

## 2020-09-26 DIAGNOSIS — F4310 Post-traumatic stress disorder, unspecified: Secondary | ICD-10-CM | POA: Diagnosis not present

## 2020-10-02 DIAGNOSIS — F4310 Post-traumatic stress disorder, unspecified: Secondary | ICD-10-CM | POA: Diagnosis not present

## 2020-10-03 ENCOUNTER — Encounter: Payer: Self-pay | Admitting: Physician Assistant

## 2020-10-03 ENCOUNTER — Ambulatory Visit (INDEPENDENT_AMBULATORY_CARE_PROVIDER_SITE_OTHER): Payer: 59 | Admitting: Physician Assistant

## 2020-10-03 ENCOUNTER — Other Ambulatory Visit: Payer: Self-pay

## 2020-10-03 ENCOUNTER — Other Ambulatory Visit: Payer: Self-pay | Admitting: Physician Assistant

## 2020-10-03 DIAGNOSIS — F411 Generalized anxiety disorder: Secondary | ICD-10-CM | POA: Diagnosis not present

## 2020-10-03 DIAGNOSIS — G4700 Insomnia, unspecified: Secondary | ICD-10-CM | POA: Diagnosis not present

## 2020-10-03 DIAGNOSIS — F4310 Post-traumatic stress disorder, unspecified: Secondary | ICD-10-CM | POA: Diagnosis not present

## 2020-10-03 DIAGNOSIS — F331 Major depressive disorder, recurrent, moderate: Secondary | ICD-10-CM

## 2020-10-03 DIAGNOSIS — G4733 Obstructive sleep apnea (adult) (pediatric): Secondary | ICD-10-CM | POA: Diagnosis not present

## 2020-10-03 MED ORDER — CLONAZEPAM 0.5 MG PO TABS: 0.5000 mg | ORAL_TABLET | Freq: Four times a day (QID) | ORAL | 5 refills | Status: DC | PRN

## 2020-10-03 MED ORDER — QUETIAPINE FUMARATE 200 MG PO TABS: 200.0000 mg | ORAL_TABLET | Freq: Every day | ORAL | 3 refills | Status: DC

## 2020-10-03 MED ORDER — PROPRANOLOL HCL 40 MG PO TABS: 40.0000 mg | ORAL_TABLET | Freq: Two times a day (BID) | ORAL | 3 refills | Status: DC

## 2020-10-03 MED FILL — PROPRANOLOL 40 MG TABLET: 40 | 90 days supply | Qty: 180 | Fill #0

## 2020-10-03 MED FILL — clonazePAM 0.5 MG TABS: 0.5 | 30 days supply | Qty: 120 | Fill #0
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
## 2020-10-04 MED FILL — QUETIAPINE FUMARATE 200 MG: 200 | 90 days supply | Qty: 90 | Fill #0

## 2020-10-09 DIAGNOSIS — F4310 Post-traumatic stress disorder, unspecified: Secondary | ICD-10-CM | POA: Diagnosis not present

## 2020-10-15 MED FILL — LO LOESTRIN FE 1-10 TABLET: 1 MG-10 MCG | 28 days supply | Qty: 28 | Fill #1

## 2020-10-16 DIAGNOSIS — F4310 Post-traumatic stress disorder, unspecified: Secondary | ICD-10-CM | POA: Diagnosis not present

## 2020-10-23 DIAGNOSIS — F4310 Post-traumatic stress disorder, unspecified: Secondary | ICD-10-CM | POA: Diagnosis not present

## 2020-10-30 DIAGNOSIS — F4310 Post-traumatic stress disorder, unspecified: Secondary | ICD-10-CM | POA: Diagnosis not present

## 2020-10-31 ENCOUNTER — Ambulatory Visit: Payer: Self-pay | Admitting: Obstetrics and Gynecology

## 2020-11-04 ENCOUNTER — Other Ambulatory Visit: Payer: Self-pay | Admitting: Family

## 2020-11-04 ENCOUNTER — Telehealth: Payer: 59 | Admitting: Family

## 2020-11-04 DIAGNOSIS — J019 Acute sinusitis, unspecified: Secondary | ICD-10-CM

## 2020-11-04 MED ORDER — DOXYCYCLINE HYCLATE 100 MG PO TABS: 100.0000 mg | ORAL_TABLET | Freq: Two times a day (BID) | ORAL | 0 refills | Status: DC

## 2020-11-04 MED ORDER — FLUCONAZOLE 150 MG PO TABS: 150.0000 mg | ORAL_TABLET | ORAL | 0 refills | Status: DC | PRN

## 2020-11-04 MED FILL — DOXYCYCLINE HYCLATE 100 MG: 100 | 10 days supply | Qty: 20 | Fill #0

## 2020-11-04 MED FILL — clonazePAM 0.5 MG TABS: 0.5 | 30 days supply | Qty: 120 | Fill #1

## 2020-11-04 MED FILL — FLUCONAZOLE 150 MG TABS: 150 | 9 days supply | Qty: 3 | Fill #0

## 2020-11-04 NOTE — Progress Notes (Signed)
We are sorry that you are not feeling well.  Here is how we plan to help!  Based on what you have shared with me it looks like you have sinusitis.  Sinusitis is inflammation and infection in the sinus cavities of the head.  Based on your presentation I believe you most likely have Acute Bacterial Sinusitis.  This is an infection caused by bacteria and is treated with antibiotics. I have prescribed Doxycycline 100mg  by mouth twice a day for 10 days.  I have also sent a diflucan. You may use an oral decongestant such as Mucinex D or if you have glaucoma or high blood pressure use plain Mucinex. Saline nasal spray help and can safely be used as often as needed for congestion.  If you develop worsening sinus pain, fever or notice severe headache and vision changes, or if symptoms are not better after completion of antibiotic, please schedule an appointment with a health care provider.    Sinus infections are not as easily transmitted as other respiratory infection, however we still recommend that you avoid close contact with loved ones, especially the very young and elderly.  Remember to wash your hands thoroughly throughout the day as this is the number one way to prevent the spread of infection!  Home Care:  Only take medications as instructed by your medical team.  Complete the entire course of an antibiotic.  Do not take these medications with alcohol.  A steam or ultrasonic humidifier can help congestion.  You can place a towel over your head and breathe in the steam from hot water coming from a faucet.  Avoid close contacts especially the very young and the elderly.  Cover your mouth when you cough or sneeze.  Always remember to wash your hands.  Get Help Right Away If:  You develop worsening fever or sinus pain.  You develop a severe head ache or visual changes.  Your symptoms persist after you have completed your treatment plan.  Make sure you  Understand these  instructions.  Will watch your condition.  Will get help right away if you are not doing well or get worse.  Your e-visit answers were reviewed by a board certified advanced clinical practitioner to complete your personal care plan.  Depending on the condition, your plan could have included both over the counter or prescription medications.  If there is a problem please reply  once you have received a response from your provider.  Your safety is important to [name].  If you have drug allergies check your prescription carefully.    You can use MyChart to ask questions about today's visit, request a non-urgent call back, or ask for a work or school excuse for 24 hours related to this e-Visit. If it has been greater than 24 hours you will need to follow up with your provider, or enter a new e-Visit to address those concerns.  You will get an e-mail in the next two days asking about your experience.  I hope that your e-visit has been valuable and will speed your recovery. Thank you for using e-visits.  Approximately 5 minutes was spent documenting and reviewing patient's chart.

## 2020-11-06 ENCOUNTER — Telehealth: Payer: Self-pay | Admitting: Physician Assistant

## 2020-11-06 DIAGNOSIS — F4310 Post-traumatic stress disorder, unspecified: Secondary | ICD-10-CM | POA: Diagnosis not present

## 2020-11-06 NOTE — Telephone Encounter (Signed)
Received fax from [employer] for completion of FMLA form. Placed on Traci's desk to complete.

## 2020-11-06 NOTE — Telephone Encounter (Signed)
Received fax from [facility] for completion of FMLA  form. Placed on Traci's desk.

## 2020-11-06 NOTE — Telephone Encounter (Signed)
Noted will review 

## 2020-11-07 NOTE — Telephone Encounter (Signed)
Received fax from [facility] for completion of FMLA  form. Placed on Traci's desk

## 2020-11-08 DIAGNOSIS — Z0289 Encounter for other administrative examinations: Secondary | ICD-10-CM

## 2020-11-08 NOTE — Telephone Encounter (Signed)
Forms completed and given to Helene Kelp to review and sign

## 2020-11-11 NOTE — Telephone Encounter (Signed)
Taken care of

## 2020-11-13 ENCOUNTER — Other Ambulatory Visit: Payer: Self-pay | Admitting: Physician Assistant

## 2020-11-13 DIAGNOSIS — F4310 Post-traumatic stress disorder, unspecified: Secondary | ICD-10-CM | POA: Diagnosis not present

## 2020-11-13 MED FILL — FLUVOXAMINE MALEATE 100 MG: 100 | 90 days supply | Qty: 90 | Fill #0

## 2020-11-13 MED FILL — PRAZOSIN 2 MG CAPSULE: 2 | 90 days supply | Qty: 270 | Fill #0

## 2020-11-13 MED FILL — LO LOESTRIN FE 1-10 TABLET: 1 MG-10 MCG | 28 days supply | Qty: 28 | Fill #2

## 2020-11-14 ENCOUNTER — Encounter (HOSPITAL_COMMUNITY): Payer: Self-pay

## 2020-11-14 ENCOUNTER — Ambulatory Visit (INDEPENDENT_AMBULATORY_CARE_PROVIDER_SITE_OTHER): Payer: 59

## 2020-11-14 ENCOUNTER — Ambulatory Visit (HOSPITAL_COMMUNITY)
Admission: EM | Admit: 2020-11-14 | Discharge: 2020-11-14 | Disposition: A | Discharge status: Home / Self Care | Payer: 59 | Attending: Emergency Medicine | Admitting: Emergency Medicine

## 2020-11-14 ENCOUNTER — Other Ambulatory Visit: Payer: Self-pay

## 2020-11-14 DIAGNOSIS — R0602 Shortness of breath: Secondary | ICD-10-CM

## 2020-11-14 DIAGNOSIS — R059 Cough, unspecified: Secondary | ICD-10-CM

## 2020-11-14 DIAGNOSIS — R070 Pain in throat: Secondary | ICD-10-CM | POA: Diagnosis not present

## 2020-11-14 DIAGNOSIS — J029 Acute pharyngitis, unspecified: Secondary | ICD-10-CM | POA: Diagnosis not present

## 2020-11-14 DIAGNOSIS — J0101 Acute recurrent maxillary sinusitis: Secondary | ICD-10-CM | POA: Diagnosis not present

## 2020-11-14 DIAGNOSIS — R509 Fever, unspecified: Secondary | ICD-10-CM | POA: Diagnosis not present

## 2020-11-14 MED ORDER — BENZONATATE 100 MG PO CAPS: 100.0000 mg | ORAL_CAPSULE | Freq: Three times a day (TID) | ORAL | 0 refills | Status: DC | PRN

## 2020-11-14 MED ORDER — AZITHROMYCIN 250 MG PO TABS: 250.0000 mg | ORAL_TABLET | Freq: Every day | ORAL | 0 refills | Status: DC

## 2020-11-14 NOTE — ED Triage Notes (Signed)
Pt presents with a sore throat, cough, loss of voice x 1 month.  Pt denies fever and states she is SOB.  She states she has been tested for COVID through [facility] and states she has taken several at home tests that have resulted negative.

## 2020-11-14 NOTE — Discharge Instructions (Signed)
Take the Zithromax and Tessalon Perles as directed.    Go to the emergency department if you have acute shortness of breath or other concerning symptoms.    Follow-up with your primary care provider next week.

## 2020-11-14 NOTE — ED Provider Notes (Signed)
[place]    CSN: [id number] Arrival date & time: 11/14/20  1625      History   Chief Complaint Chief Complaint  Patient presents with  . Sore Throat  . Cough  . Shortness of Breath    HPI Caroline Gonzales is a 45 y.o. female.   Patient presents with 1 month history of nonproductive cough, shortness of breath, sore throat, hoarse voice, sinus congestion, and postnasal drip.  She also reports some diarrhea since completing the doxycycline she was recently on.  She states she had a low-grade fever 2 weeks ago but none since.  She denies difficulty swallowing, rash, wheezing, vomiting, or other symptoms.  Patient had an E-visit on 11/04/2020; diagnosed with acute sinusitis; treated with doxycycline.  Her medical history includes prediabetes, GERD, back pain, bipolar 1 disorder, depression, anxiety, PTSD.  The history is provided by the patient and medical records.    Past Medical History:  Diagnosis Date  . Allergy    Zyrtec, Allegra.  . Anxiety    followed by Dr. Toy Cookey  . Back pain   . Bipolar 1 disorder ([place])   . Complication of anesthesia   . Depression   . Fibroid   . Gallbladder problem   . Gastroparesis 09/14/2012   gastric emptying study in 2014  . GERD (gastroesophageal reflux disease)   . Headache   . Pneumonia    2013ish  . PONV (postoperative nausea and vomiting)   . Pre-diabetes   . PTSD (post-traumatic stress disorder)   . Sleep apnea    do not use a cpap  . Tinnitus   . Vitamin D deficiency   . Wears glasses     Patient Active Problem List   Diagnosis Date Noted  . PTSD (post-traumatic stress disorder) 07/21/2018  . GAD (generalized anxiety disorder) 07/21/2018  . Insomnia 07/21/2018  . MDD (major depressive disorder) 07/21/2018  . Nightmares 07/21/2018  . Insulin resistance 10/21/2017  . Other hyperlipidemia 10/07/2017  . Vitamin D deficiency 10/07/2017  . Bipolar depression ([facility]) 04/27/2017  . Class 3 obesity with serious  comorbidity and body mass index (BMI) of 40.0 to 44.9 in adult 04/27/2017  . Residual foreign body in soft tissue 11/12/2016  . Prediabetes 11/02/2016  . Morbid obesity ([place]) 10/15/2016  . Bipolar 1 disorder [facility])     Past Surgical History:  Procedure Laterality Date  . ANKLE ARTHROSCOPY Left 2011  . CESAREAN SECTION    . CHOLECYSTECTOMY    . DILATION AND CURETTAGE OF UTERUS    . FOREIGN BODY REMOVAL Left 11/18/2016   Procedure: REMOVAL FOREIGN BODY EXTREMITY LEFT FOOT;  Surgeon: Trula Slade, DPM;  Location: [facility];  Service: Podiatry;  Laterality: Left;  . PILONIDAL CYST EXCISION  1990's  . RADIOLOGY WITH ANESTHESIA N/A 11/10/2018   Procedure: MRI WITH ANESTHESIA OF BRAIN AND ORBITS WITH AND WITHOUT CONTRAST;  Surgeon: Radiologist, Medication, MD;  Location: [facility];  Service: Radiology;  Laterality: N/A;  . TENDON REPAIR Left 2011   Left Ankle  . WISDOM TOOTH EXTRACTION  19090's    OB History    Gravida  3   Para  1   Term  1   Preterm  0   AB  0   Living  0     SAB  0   IAB  0   Ectopic  0   Multiple  0   Live Births  0  Home Medications    Prior to Admission medications   Medication Sig Start Date End Date Taking? Authorizing Provider  azithromycin (ZITHROMAX) 250 MG tablet Take 1 tablet (250 mg total) by mouth daily. Take first 2 tablets together, then 1 every day until finished. 11/14/20  Yes Sharion Balloon, NP  benzonatate (TESSALON) 100 MG capsule Take 1 capsule (100 mg total) by mouth 3 (three) times daily as needed for cough. 11/14/20  Yes Sharion Balloon, NP  Cholecalciferol (VITAMIN D3) 5000 units CAPS Take 5,000 Units by mouth at bedtime.    [provider]  clonazePAM (KLONOPIN) 0.5 MG tablet Take 1 tablet (0.5 mg total) by mouth every 6 (six) hours as needed. for anxiety 10/03/20   Donnal Moat T, PA-C  doxycycline (VIBRA-TABS) 100 MG tablet Take 1 tablet (100 mg total) by mouth 2 (two) times daily. 11/04/20   Sharion Balloon,  FNP  fluconazole (DIFLUCAN) 150 MG tablet Take 1 tablet (150 mg total) by mouth every three (3) days as needed. 11/04/20   Sharion Balloon, FNP  fluvoxaMINE (LUVOX) 100 MG tablet TAKE 1 TABLET (100 MG TOTAL) BY MOUTH AT BEDTIME. 11/13/20   Hurst, Helene Kelp T, PA-C  gabapentin (NEURONTIN) 300 MG capsule Take 2 capsules (600 mg total) by mouth at bedtime. 09/03/20   Donnal Moat T, PA-C  ibuprofen (ADVIL,MOTRIN) 200 MG tablet Take 400 mg by mouth daily as needed for headache or moderate pain.    [provider]  metFORMIN (GLUCOPHAGE) 1000 MG tablet Take 1 tablet (1,000 mg total) by mouth daily with breakfast. 01/18/20 07/16/20  Laqueta Linden, MD  Multiple Vitamin (MULTIVITAMIN) tablet Take 1 tablet by mouth at bedtime.     [provider]  NEXIUM 20 MG packet Take by mouth at bedtime. 10/03/19   [provider]  Norethindrone-Ethinyl Estradiol-Fe Biphas (LO LOESTRIN FE) 1 MG-10 MCG / 10 MCG tablet Take 1 tablet by mouth daily. 09/19/20   Salvadore Dom, MD  oxycodone (OXY-IR) 5 MG capsule Take 1 capsule (5 mg total) by mouth every 4 (four) hours as needed. Patient not taking: No sig reported 07/18/20   Salvadore Dom, MD  prazosin (MINIPRESS) 2 MG capsule TAKE 3 CAPSULES (6 MG TOTAL) BY MOUTH AT BEDTIME. 11/13/20   Hurst, Dorothea Glassman, PA-C  propranolol (INDERAL) 40 MG tablet Take 1 tablet (40 mg total) by mouth 2 (two) times daily. 10/03/20   Donnal Moat T, PA-C  QUEtiapine (SEROQUEL) 200 MG tablet Take 1 tablet (200 mg total) by mouth at bedtime. 10/03/20   Addison Lank, PA-C  Topiramate ER (TROKENDI XR) 100 MG CP24 Take 100 mg by mouth daily. Patient not taking: No sig reported 01/18/20   Laqueta Linden, MD    Family History Family History  Problem Relation Age of Onset  . Cancer Mother        squamous cell carcinoma  . Hyperlipidemia Mother   . Depression Mother   . Anxiety disorder Mother   . Obesity Mother   . Heart disease Father 61        cardiomegaly, CHF; steroid use.  [name] Hyperlipidemia Father   . Hypertension Father   . Mental retardation Father   . High blood pressure Father   . Depression Father   . Anxiety disorder Father   . Obesity Father   . Cancer Maternal Grandmother   . Diabetes Maternal Grandmother   . Heart disease Maternal Grandmother   . Hyperlipidemia Maternal Grandmother   .  Hypertension Maternal Grandmother   . Stroke Maternal Grandmother   . Heart disease Paternal Grandmother   . Hypertension Paternal Grandmother   . Heart disease Paternal Grandfather   . Hyperlipidemia Paternal Grandfather   . Mental illness Paternal Grandfather   . Rheum arthritis Sister   . Multiple sclerosis Sister   . Breast cancer Maternal Aunt   . Thyroid cancer Paternal Aunt     Social History Social History   Tobacco Use  . Smoking status: Former Smoker    Years: 10.00    Types: Cigarettes    Quit date: 2003    Years since quitting: 19.1  . Smokeless tobacco: Never Used  Vaping Use  . Vaping Use: Never used  Substance Use Topics  . Alcohol use: Yes    Alcohol/week: 0.0 standard drinks    Comment: every few months  . Drug use: No     Allergies   Penicillins, Tamiflu [oseltamivir phosphate], Tamiflu [oseltamivir phosphate], Cephalosporins, and Latex   Review of Systems Review of Systems  Constitutional: Negative for chills and fever.  HENT: Positive for congestion, postnasal drip, sore throat and voice change. Negative for ear pain and trouble swallowing.   Eyes: Negative for pain and visual disturbance.  Respiratory: Positive for cough and shortness of breath.   Cardiovascular: Negative for chest pain and palpitations.  Gastrointestinal: Negative for abdominal pain, diarrhea and vomiting.  Genitourinary: Negative for dysuria and hematuria.  Musculoskeletal: Negative for arthralgias and back pain.  Skin: Negative for color change and rash.  Neurological: Negative for seizures and syncope.  All  other systems reviewed and are negative.    Physical Exam Triage Vital Signs ED Triage Vitals  Enc Vitals Group     BP      Pulse      Resp      Temp      Temp src      SpO2      Weight      Height      Head Circumference      Peak Flow      Pain Score      Pain Loc      Pain Edu?      Excl. in [place]?    No data found.  Updated Vital Signs BP 122/67 (BP Location: Left Arm)   Pulse 73   Temp 97.9 F (36.6 C) (Temporal)   Resp 17   LMP  (LMP Unknown) Comment: Last one since 2014  SpO2 100%   Visual Acuity Right Eye Distance:   Left Eye Distance:   Bilateral Distance:    Right Eye Near:   Left Eye Near:    Bilateral Near:     Physical Exam Vitals and nursing note reviewed.  Constitutional:      General: She is not in acute distress.    Appearance: She is well-developed and well-nourished. She is obese. She is not ill-appearing.  HENT:     Head: Normocephalic and atraumatic.     Right Ear: Tympanic membrane normal.     Left Ear: Tympanic membrane normal.     Nose: Nose normal.     Mouth/Throat:     Mouth: Mucous membranes are moist.     Pharynx: Oropharynx is clear.  Eyes:     Conjunctiva/sclera: Conjunctivae normal.  Cardiovascular:     Rate and Rhythm: Normal rate and regular rhythm.     Heart sounds: Normal heart sounds.  Pulmonary:     Effort: Pulmonary  effort is normal. No respiratory distress.     Breath sounds: Normal breath sounds.  Abdominal:     Palpations: Abdomen is soft.     Tenderness: There is no abdominal tenderness.  Musculoskeletal:        General: No edema.     Cervical back: Neck supple.  Skin:    General: Skin is warm and dry.     Findings: No rash.  Neurological:     General: No focal deficit present.     Mental Status: She is alert and oriented to person, place, and time.     Gait: Gait normal.  Psychiatric:        Mood and Affect: Mood and affect and mood normal.        Behavior: Behavior normal.      UC Treatments /  Results  Labs (all labs ordered are listed, but only abnormal results are displayed) Labs Reviewed - No data to display  EKG   Radiology DG Chest 2 View  Result Date: 11/14/2020 CLINICAL DATA:  Pt presents with a sore throat, cough, loss of voice x 1 month. Pt denies fever and states she is SOB. She states she has been tested for COVID through [facility] and states she has taken several at home tests that have resulted negative. EXAM: CHEST - 2 VIEW COMPARISON:  Chest radiograph 06/19/2016 FINDINGS: Stable cardiomediastinal contours. The lungs are clear. No pneumothorax or pleural effusion. No acute finding in the visualized skeleton. The the IMPRESSION: No active cardiopulmonary disease. Electronically Signed   By: Audie Pinto M.D.   On: 11/14/2020 17:23    Procedures Procedures (including critical care time)  Medications Ordered in UC Medications - No data to display  Initial Impression / Assessment and Plan / UC Course  I have reviewed the triage vital signs and the nursing notes.  Pertinent labs & imaging results that were available during my care of the patient were reviewed by me and considered in my medical decision making (see chart for details).   Shortness of breath, cough, acute recurrent sinusitis.  Patient is allergic to several medications, including penicillin and cephalosporins.  She recently completed a course of doxycycline for sinusitis.  Treating today with Zithromax and Tessalon Perles.  Strict ED precautions discussed.  Instructed her to follow-up with her PCP next week.  She declines COVID test today as she has had several negative tests in the past few weeks; last negative test done last night.  She agrees to plan of care.   Final Clinical Impressions(s) / UC Diagnoses   Final diagnoses:  Shortness of breath  Acute recurrent maxillary sinusitis  Cough     Discharge Instructions     Take the Zithromax and Tessalon Perles as directed.    Go to the  emergency department if you have acute shortness of breath or other concerning symptoms.    Follow-up with your primary care provider next week.        ED Prescriptions    Medication Sig Dispense Auth. Provider   azithromycin (ZITHROMAX) 250 MG tablet Take 1 tablet (250 mg total) by mouth daily. Take first 2 tablets together, then 1 every day until finished. 6 tablet Sharion Balloon, NP   benzonatate (TESSALON) 100 MG capsule Take 1 capsule (100 mg total) by mouth 3 (three) times daily as needed for cough. 21 capsule Sharion Balloon, NP     PDMP not reviewed this encounter.   Sharion Balloon, NP 11/14/20 [phone]

## 2020-11-20 ENCOUNTER — Telehealth: Payer: Self-pay | Admitting: Physician Assistant

## 2020-11-20 DIAGNOSIS — F4310 Post-traumatic stress disorder, unspecified: Secondary | ICD-10-CM | POA: Diagnosis not present

## 2020-11-20 NOTE — Telephone Encounter (Signed)
error 

## 2020-11-21 ENCOUNTER — Ambulatory Visit (INDEPENDENT_AMBULATORY_CARE_PROVIDER_SITE_OTHER): Payer: 59

## 2020-11-21 ENCOUNTER — Encounter: Payer: Self-pay | Admitting: Family Medicine

## 2020-11-21 ENCOUNTER — Other Ambulatory Visit: Payer: Self-pay | Admitting: Family Medicine

## 2020-11-21 ENCOUNTER — Other Ambulatory Visit: Payer: Self-pay

## 2020-11-21 ENCOUNTER — Telehealth (INDEPENDENT_AMBULATORY_CARE_PROVIDER_SITE_OTHER): Payer: 59 | Admitting: Family Medicine

## 2020-11-21 VITALS — HR 72 | Temp 98.9°F | Ht 65.0 in

## 2020-11-21 DIAGNOSIS — J45998 Other asthma: Secondary | ICD-10-CM

## 2020-11-21 MED ORDER — METHYLPREDNISOLONE ACETATE 80 MG/ML IJ SUSP: 80.0000 mg | Freq: Once | INTRAMUSCULAR | Status: DC

## 2020-11-21 MED ORDER — METHYLPREDNISOLONE ACETATE 40 MG/ML IJ SUSP
80.0000 mg | Freq: Once | INTRAMUSCULAR | Status: AC
  Administered 2020-11-21: 80 mg via INTRAMUSCULAR

## 2020-11-21 NOTE — Progress Notes (Signed)
After obtaining consent, and per orders of Dr.Cirigliano, injection of DepoMedrol given right glutt by Lynda Rainwater. Patient instructed to remain in clinic for 20 minutes afterwards, and to report any adverse reaction to me immediately.

## 2020-11-21 NOTE — Progress Notes (Signed)
Virtual Visit via Video Note  I connected with Caroline Gonzales on 11/21/20 at 11:00 AM EST by a video enabled telemedicine application and verified that I am speaking with the correct person using two identifiers. Location patient: home Location provider: work Persons participating in the virtual visit: patient, provider  I discussed the limitations of evaluation and management by telemedicine and the availability of in person appointments. The patient expressed understanding and agreed to proceed.  Chief Complaint  Patient presents with  . Pain Management    HFU-low grade fever, congestion, fatigue.  Pt has been UC and chest X-ray and was cleared.  Pt still having symptoms x 1 month.   HPI: Caroline Gonzales is a 45 y.o. female  Pt had e-visit on 11/04/20 and was Rx'd doxycycline for acute sinusitis. She was then seen at [facility] on 11/14/20 and Rx'd azithromycin and tessalon. Normal CXR on 11/14/20.  Pt complains of "low grade fever" of 99.8 or 99.9 - states she normally runs 97. She is having nasal and facial congestion. She is coughing and at times this is productive with clear mucous. Some SOB w/ minimal exertion. She notes being hoarse at the end of the day.  She is taking mucinex 1200mg  daily. She is also using afrin. She takes zyrtec daily at night.   Past Medical History:  Diagnosis Date  . Allergy    Zyrtec, Allegra.  . Anxiety    followed by Dr. Toy Cookey  . Back pain   . Bipolar 1 disorder ([place])   . Complication of anesthesia   . Depression   . Fibroid   . Gallbladder problem   . Gastroparesis 09/14/2012   gastric emptying study in 2014  . GERD (gastroesophageal reflux disease)   . Headache   . Pneumonia    2013ish  . PONV (postoperative nausea and vomiting)   . Pre-diabetes   . PTSD (post-traumatic stress disorder)   . Sleep apnea    do not use a cpap  . Tinnitus   . Vitamin D deficiency   . Wears glasses     Past Surgical History:  Procedure Laterality Date  . ANKLE  ARTHROSCOPY Left 2011  . CESAREAN SECTION    . CHOLECYSTECTOMY    . DILATION AND CURETTAGE OF UTERUS    . FOREIGN BODY REMOVAL Left 11/18/2016   Procedure: REMOVAL FOREIGN BODY EXTREMITY LEFT FOOT;  Surgeon: Trula Slade, DPM;  Location: [facility];  Service: Podiatry;  Laterality: Left;  . PILONIDAL CYST EXCISION  1990's  . RADIOLOGY WITH ANESTHESIA N/A 11/10/2018   Procedure: MRI WITH ANESTHESIA OF BRAIN AND ORBITS WITH AND WITHOUT CONTRAST;  Surgeon: Radiologist, Medication, MD;  Location: [facility];  Service: Radiology;  Laterality: N/A;  . TENDON REPAIR Left 2011   Left Ankle  . WISDOM TOOTH EXTRACTION  19090's    Family History  Problem Relation Age of Onset  . Cancer Mother        squamous cell carcinoma  . Hyperlipidemia Mother   . Depression Mother   . Anxiety disorder Mother   . Obesity Mother   . Heart disease Father 62       cardiomegaly, CHF; steroid use.  [name] Hyperlipidemia Father   . Hypertension Father   . Mental retardation Father   . High blood pressure Father   . Depression Father   . Anxiety disorder Father   . Obesity Father   . Cancer Maternal Grandmother   . Diabetes Maternal Grandmother   .  Heart disease Maternal Grandmother   . Hyperlipidemia Maternal Grandmother   . Hypertension Maternal Grandmother   . Stroke Maternal Grandmother   . Heart disease Paternal Grandmother   . Hypertension Paternal Grandmother   . Heart disease Paternal Grandfather   . Hyperlipidemia Paternal Grandfather   . Mental illness Paternal Grandfather   . Rheum arthritis Sister   . Multiple sclerosis Sister   . Breast cancer Maternal Aunt   . Thyroid cancer Paternal Aunt     Social History   Tobacco Use  . Smoking status: Former Smoker    Years: 10.00    Types: Cigarettes    Quit date: 2003    Years since quitting: 19.2  . Smokeless tobacco: Never Used  Vaping Use  . Vaping Use: Never used  Substance Use Topics  . Alcohol use: Yes    Alcohol/week: 0.0 standard drinks     Comment: every few months  . Drug use: No     Current Outpatient Medications:  .  Cholecalciferol (VITAMIN D3) 5000 units CAPS, Take 5,000 Units by mouth at bedtime., Disp: , Rfl:  .  clonazePAM (KLONOPIN) 0.5 MG tablet, Take 1 tablet (0.5 mg total) by mouth every 6 (six) hours as needed. for anxiety, Disp: 120 tablet, Rfl: 5 .  fluvoxaMINE (LUVOX) 100 MG tablet, TAKE 1 TABLET (100 MG TOTAL) BY MOUTH AT BEDTIME., Disp: 90 tablet, Rfl: 1 .  gabapentin (NEURONTIN) 300 MG capsule, Take 2 capsules (600 mg total) by mouth at bedtime., Disp: 180 capsule, Rfl: 1 .  ibuprofen (ADVIL,MOTRIN) 200 MG tablet, Take 400 mg by mouth daily as needed for headache or moderate pain., Disp: , Rfl:  .  Multiple Vitamin (MULTIVITAMIN) tablet, Take 1 tablet by mouth at bedtime. , Disp: , Rfl:  .  NEXIUM 20 MG packet, Take by mouth at bedtime., Disp: , Rfl:  .  Norethindrone-Ethinyl Estradiol-Fe Biphas (LO LOESTRIN FE) 1 MG-10 MCG / 10 MCG tablet, Take 1 tablet by mouth daily., Disp: 84 tablet, Rfl: 0 .  prazosin (MINIPRESS) 2 MG capsule, TAKE 3 CAPSULES (6 MG TOTAL) BY MOUTH AT BEDTIME., Disp: 270 capsule, Rfl: 1 .  propranolol (INDERAL) 40 MG tablet, Take 1 tablet (40 mg total) by mouth 2 (two) times daily., Disp: 180 tablet, Rfl: 3 .  QUEtiapine (SEROQUEL) 200 MG tablet, Take 1 tablet (200 mg total) by mouth at bedtime. (Patient taking differently: Take 200 mg by mouth at bedtime. Patient take 1 tablet and 1/2 of tablet), Disp: 90 tablet, Rfl: 3 .  azithromycin (ZITHROMAX) 250 MG tablet, Take 1 tablet (250 mg total) by mouth daily. Take first 2 tablets together, then 1 every day until finished. (Patient not taking: Reported on 11/21/2020), Disp: 6 tablet, Rfl: 0 .  benzonatate (TESSALON) 100 MG capsule, Take 1 capsule (100 mg total) by mouth 3 (three) times daily as needed for cough. (Patient not taking: Reported on 11/21/2020), Disp: 21 capsule, Rfl: 0 .  doxycycline (VIBRA-TABS) 100 MG tablet, Take 1 tablet (100  mg total) by mouth 2 (two) times daily., Disp: 20 tablet, Rfl: 0 .  fluconazole (DIFLUCAN) 150 MG tablet, Take 1 tablet (150 mg total) by mouth every three (3) days as needed., Disp: 3 tablet, Rfl: 0 .  metFORMIN (GLUCOPHAGE) 1000 MG tablet, Take 1 tablet (1,000 mg total) by mouth daily with breakfast., Disp: 30 tablet, Rfl: 5 .  Topiramate ER (TROKENDI XR) 100 MG CP24, Take 100 mg by mouth daily. (Patient not taking: No sig reported), Disp:  30 capsule, Rfl: 5  Allergies  Allergen Reactions  . Penicillins Shortness Of Breath and Rash    Did it involve swelling of the face/tongue/throat, SOB, or low BP? Yes Did it involve sudden or severe rash/hives, skin peeling, or any reaction on the inside of your mouth or nose? Yes Did you need to seek medical attention at a hospital or doctor's office? Yes When did it last happen?childhood allergy If all above answers are "NO", may proceed with cephalosporin use.    . Tamiflu [Oseltamivir Phosphate] Anaphylaxis  . Tamiflu [Oseltamivir Phosphate] Anaphylaxis  . Cephalosporins Rash  . Latex Rash      ROS: See pertinent positives and negatives per HPI.   EXAM:  VITALS per patient if applicable: Pulse 72   Temp 98.9 F (37.2 C) (Tympanic)   Ht 5\' 5"  (1.651 m)   LMP 11/19/2020 Comment: Last one since 2014  BMI 47.59 kg/m    GENERAL: alert, oriented, appears well and in no acute distress  HEENT: atraumatic, conjunctiva clear, no obvious abnormalities on inspection of external nose and ears  NECK: normal movements of the head and neck  LUNGS: on inspection no signs of respiratory distress, breathing rate appears normal, no obvious gross SOB, gasping or wheezing, no conversational dyspnea  CV: no obvious cyanosis  PSYCH/NEURO: pleasant and cooperative, no obvious depression or anxiety, speech and thought processing grossly intact   ASSESSMENT AND PLAN: 1. Post-viral reactive airway disease - cont with supportive care - pt  declines oral steroid d/t concern about worsening anxiety/mental health symptoms which have increased recently unrelated to above symptoms - pt will come for RN visit this afternoon at 3:20pm for depo-medrol 80mg  IM x 1 - she will f/u in 5-7 days via phone or mychart if symptoms have not significantly improved    I discussed the assessment and treatment plan with the patient. The patient was provided an opportunity to ask questions and all were answered. The patient agreed with the plan and demonstrated an understanding of the instructions.   The patient was advised to call back or seek an in-person evaluation if the symptoms worsen or if the condition fails to improve as anticipated.   Letta Median, DO

## 2020-11-22 ENCOUNTER — Telehealth: Payer: Self-pay | Admitting: Physician Assistant

## 2020-11-22 DIAGNOSIS — F4310 Post-traumatic stress disorder, unspecified: Secondary | ICD-10-CM | POA: Diagnosis not present

## 2020-11-22 NOTE — Telephone Encounter (Signed)
On the Seroquel, is she taking 200 mg or 300?  We had talked about increasing the dose at the last visit.  If she is taking 300 have her go on up to 400 mg (she has 200 mg pills.)  If she is at 200 mg, go up to 300 mg.  Do this for 4 days or so and then she can go back to the current dose of Seroquel.  If she is not taking the Klonopin routinely, I recommend she do so.  She has enough to take 4 times daily as needed.  Take it around the clock for the next 2 to 3 days at least until the initial effects of the steroid shot wears off.

## 2020-11-22 NOTE — Telephone Encounter (Signed)
Pt has been informed.She said she has been taking 300 mg of Seroquel and the Klonopin TID.

## 2020-11-22 NOTE — Telephone Encounter (Signed)
Noted  

## 2020-11-22 NOTE — Telephone Encounter (Signed)
Pt called to report she had steroid shot yesterday and has thrown her for a loop meaning anxiety is very high. She did scrap herself on purpose. Getting off work, friend will be with her at friends house and has apt with therapist @ 2pm today. Also, has apt with you on Monday. Just checking to see if you had any advise  On this matter before apt. Contact # [phone]

## 2020-11-25 ENCOUNTER — Other Ambulatory Visit: Payer: Self-pay | Admitting: Physician Assistant

## 2020-11-25 ENCOUNTER — Other Ambulatory Visit: Payer: Self-pay

## 2020-11-25 ENCOUNTER — Encounter: Payer: Self-pay | Admitting: Physician Assistant

## 2020-11-25 ENCOUNTER — Ambulatory Visit (INDEPENDENT_AMBULATORY_CARE_PROVIDER_SITE_OTHER): Payer: 59 | Admitting: Physician Assistant

## 2020-11-25 VITALS — BP 130/75 | HR 83

## 2020-11-25 DIAGNOSIS — F515 Nightmare disorder: Secondary | ICD-10-CM | POA: Diagnosis not present

## 2020-11-25 DIAGNOSIS — F331 Major depressive disorder, recurrent, moderate: Secondary | ICD-10-CM

## 2020-11-25 DIAGNOSIS — F411 Generalized anxiety disorder: Secondary | ICD-10-CM

## 2020-11-25 DIAGNOSIS — F4310 Post-traumatic stress disorder, unspecified: Secondary | ICD-10-CM | POA: Diagnosis not present

## 2020-11-25 MED ORDER — QUETIAPINE FUMARATE 300 MG PO TABS: 300.0000 mg | ORAL_TABLET | Freq: Every day | ORAL | 1 refills | Status: DC

## 2020-11-25 MED ORDER — PRAZOSIN HCL 2 MG PO CAPS: 8.0000 mg | ORAL_CAPSULE | Freq: Every day | ORAL | 1 refills | Status: DC
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
## 2020-11-27 DIAGNOSIS — F4310 Post-traumatic stress disorder, unspecified: Secondary | ICD-10-CM | POA: Diagnosis not present

## 2020-11-29 ENCOUNTER — Other Ambulatory Visit (HOSPITAL_COMMUNITY): Payer: Self-pay | Admitting: Pharmacist

## 2020-12-04 DIAGNOSIS — F4310 Post-traumatic stress disorder, unspecified: Secondary | ICD-10-CM | POA: Diagnosis not present

## 2020-12-05 ENCOUNTER — Telehealth: Payer: Self-pay | Admitting: Physician Assistant

## 2020-12-05 NOTE — Telephone Encounter (Signed)
Pt Caroline Gonzales on VM reporting on Seroquel 300 mg, she is still having flashbacks. Asking if she should increase to 400 mg? Advise increase and may need new Rx sent. Contact # [phone]   Apt 5/20

## 2020-12-05 NOTE — Telephone Encounter (Signed)
Please review

## 2020-12-06 ENCOUNTER — Ambulatory Visit (HOSPITAL_COMMUNITY)
Admission: EM | Admit: 2020-12-06 | Discharge: 2020-12-06 | Disposition: A | Discharge status: Home / Self Care | Payer: 59 | Attending: Psychiatry | Admitting: Psychiatry

## 2020-12-06 ENCOUNTER — Other Ambulatory Visit: Payer: Self-pay

## 2020-12-06 ENCOUNTER — Other Ambulatory Visit (HOSPITAL_COMMUNITY): Payer: Self-pay | Admitting: Psychiatry

## 2020-12-06 DIAGNOSIS — F411 Generalized anxiety disorder: Secondary | ICD-10-CM | POA: Diagnosis not present

## 2020-12-06 DIAGNOSIS — F329 Major depressive disorder, single episode, unspecified: Secondary | ICD-10-CM | POA: Diagnosis not present

## 2020-12-06 DIAGNOSIS — F4310 Post-traumatic stress disorder, unspecified: Secondary | ICD-10-CM

## 2020-12-06 MED ORDER — QUETIAPINE FUMARATE 100 MG PO TABS: 100.0000 mg | ORAL_TABLET | Freq: Every day | ORAL | 0 refills | Status: DC

## 2020-12-06 MED FILL — QUETIAPINE FUMARATE 100 MG: 100 | 4 days supply | Qty: 4 | Fill #0

## 2020-12-06 NOTE — ED Notes (Signed)
Patient A&O x 4, ambulatory. Patient discharged in no acute distress. Patient denied SI/HI, A/VH upon discharge. Patient verbalized understanding of all discharge instructions explained by staff, to include follow up appointments and safety plan. Patient escorted to lobby via staff for transport to destination. Safety maintained.
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
## 2020-12-06 NOTE — Discharge Instructions (Addendum)

## 2020-12-06 NOTE — Telephone Encounter (Signed)
Left msg with information and to call back if needs any medication.

## 2020-12-06 NOTE — Telephone Encounter (Signed)
Yes she can go up to 400 mg or if she has a lot of the Seroquel 300 mg she can take 1.5 pills (450 mg).  Let me know if I need to send a prescription in.

## 2020-12-06 NOTE — [facility] Counselor (Signed)
This counselor spoke with patient's husband, Kathlyn Sacramento, regarding plan of care. He is in agreement and feels safe with her discharge plan. He is agreeable to bringing patient back to [facility] or the ED if symptoms worsen.
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
## 2020-12-09 ENCOUNTER — Other Ambulatory Visit: Payer: Self-pay | Admitting: Obstetrics and Gynecology

## 2020-12-09 ENCOUNTER — Encounter: Payer: Self-pay | Admitting: Obstetrics and Gynecology

## 2020-12-09 DIAGNOSIS — F4310 Post-traumatic stress disorder, unspecified: Secondary | ICD-10-CM | POA: Diagnosis not present

## 2020-12-09 DIAGNOSIS — Z3009 Encounter for other general counseling and advice on contraception: Secondary | ICD-10-CM

## 2020-12-09 MED FILL — clonazePAM 0.5 MG TABS: 0.5 | 30 days supply | Qty: 120 | Fill #2

## 2020-12-09 NOTE — Telephone Encounter (Signed)
Please let the patient know that I refilled her OCP's x 1 month and remind her to schedule her mammogram. Needs to have UTD mammograms to keep taking OCP's.

## 2020-12-09 NOTE — Telephone Encounter (Signed)
Medication refill request: Lo loestrin  Last AEX:  04-24-2019 JJ  Next AEX: 01-01-21 Last MMG (if hormonal medication request): 07-12-18 density B/BIRADS 1 negative Refill authorized: Today, please advise.   Medication pended for #28, 0RF. Please refill if appropriate.

## 2020-12-10 ENCOUNTER — Telehealth (HOSPITAL_COMMUNITY): Payer: Self-pay | Admitting: Psychiatry

## 2020-12-10 ENCOUNTER — Telehealth: Payer: Self-pay | Admitting: Physician Assistant

## 2020-12-10 ENCOUNTER — Other Ambulatory Visit: Payer: Self-pay | Admitting: Physician Assistant

## 2020-12-10 ENCOUNTER — Ambulatory Visit (INDEPENDENT_AMBULATORY_CARE_PROVIDER_SITE_OTHER): Payer: 59 | Admitting: Physician Assistant

## 2020-12-10 ENCOUNTER — Encounter: Payer: Self-pay | Admitting: Physician Assistant

## 2020-12-10 ENCOUNTER — Other Ambulatory Visit: Payer: Self-pay

## 2020-12-10 DIAGNOSIS — F4310 Post-traumatic stress disorder, unspecified: Secondary | ICD-10-CM | POA: Diagnosis not present

## 2020-12-10 DIAGNOSIS — F515 Nightmare disorder: Secondary | ICD-10-CM

## 2020-12-10 DIAGNOSIS — F332 Major depressive disorder, recurrent severe without psychotic features: Secondary | ICD-10-CM | POA: Diagnosis not present

## 2020-12-10 DIAGNOSIS — G4733 Obstructive sleep apnea (adult) (pediatric): Secondary | ICD-10-CM

## 2020-12-10 DIAGNOSIS — F411 Generalized anxiety disorder: Secondary | ICD-10-CM

## 2020-12-10 DIAGNOSIS — R4588 Nonsuicidal self-harm: Secondary | ICD-10-CM | POA: Diagnosis not present

## 2020-12-10 MED ORDER — FLUVOXAMINE MALEATE 50 MG PO TABS: 50.0000 mg | ORAL_TABLET | Freq: Every day | ORAL | 1 refills | Status: DC

## 2020-12-10 MED FILL — FLUVOXAMINE MALEATE 50 MG T: 50 | 90 days supply | Qty: 90 | Fill #0

## 2020-12-10 MED FILL — LO LOESTRIN FE 1-10 TABLET: 1 MG-10 MCG | 28 days supply | Qty: 28 | Fill #0

## 2020-12-10 NOTE — Telephone Encounter (Signed)
D:  Pt phoned to inquire about MH-IOP; states Nada Libman, PA referred her.  A:  Oriented pt.  Patient will start virtual MH-IOP on 12-16-20. Encouraged pt to verify her benefits.   R:  Pt receptive.
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
## 2020-12-10 NOTE — Telephone Encounter (Signed)
Received fax from [employer] regarding [name] Schermerhorn. They need completion of an FMLA form. Placed on Traci's desk.

## 2020-12-11 DIAGNOSIS — F4310 Post-traumatic stress disorder, unspecified: Secondary | ICD-10-CM | POA: Diagnosis not present

## 2020-12-13 NOTE — Telephone Encounter (Signed)
Detailed message left on mobile number advising patient of message as seen below from Dr. Talbert Nan. Advised patient to return call if any additional questions or concerns.

## 2020-12-14 ENCOUNTER — Other Ambulatory Visit (HOSPITAL_COMMUNITY): Payer: Self-pay

## 2020-12-17 ENCOUNTER — Other Ambulatory Visit: Payer: Self-pay

## 2020-12-17 ENCOUNTER — Other Ambulatory Visit (HOSPITAL_COMMUNITY): Payer: 59 | Attending: Psychiatry | Admitting: Psychiatry

## 2020-12-17 ENCOUNTER — Encounter (HOSPITAL_COMMUNITY): Payer: Self-pay | Admitting: Psychiatry

## 2020-12-17 DIAGNOSIS — Z87891 Personal history of nicotine dependence: Secondary | ICD-10-CM | POA: Diagnosis not present

## 2020-12-17 DIAGNOSIS — Z79899 Other long term (current) drug therapy: Secondary | ICD-10-CM | POA: Diagnosis not present

## 2020-12-17 DIAGNOSIS — F519 Sleep disorder not due to a substance or known physiological condition, unspecified: Secondary | ICD-10-CM | POA: Insufficient documentation

## 2020-12-17 DIAGNOSIS — Z880 Allergy status to penicillin: Secondary | ICD-10-CM | POA: Diagnosis not present

## 2020-12-17 DIAGNOSIS — Z793 Long term (current) use of hormonal contraceptives: Secondary | ICD-10-CM | POA: Diagnosis not present

## 2020-12-17 DIAGNOSIS — G4700 Insomnia, unspecified: Secondary | ICD-10-CM | POA: Insufficient documentation

## 2020-12-17 DIAGNOSIS — F332 Major depressive disorder, recurrent severe without psychotic features: Secondary | ICD-10-CM | POA: Diagnosis not present

## 2020-12-17 DIAGNOSIS — Z7984 Long term (current) use of oral hypoglycemic drugs: Secondary | ICD-10-CM | POA: Diagnosis not present

## 2020-12-17 DIAGNOSIS — F411 Generalized anxiety disorder: Secondary | ICD-10-CM | POA: Diagnosis not present

## 2020-12-17 DIAGNOSIS — Z881 Allergy status to other antibiotic agents status: Secondary | ICD-10-CM | POA: Diagnosis not present

## 2020-12-17 DIAGNOSIS — F4310 Post-traumatic stress disorder, unspecified: Secondary | ICD-10-CM | POA: Insufficient documentation

## 2020-12-17 NOTE — Progress Notes (Signed)
Virtual Visit via Video Note  I connected with Caroline Gonzales on 12/17/20 at  9:00 AM EDT by a video enabled telemedicine application and verified that I am speaking with the correct person using two identifiers.  At orientation to the IOP program, Case Manager discussed the limitations of evaluation and management by telemedicine and the availability of in person appointments. The patient expressed understanding and agreed to proceed with virtual visits throughout the duration of the program.   Location:  Patient: Patient Home Provider: Home Office   History of Present Illness: MDD and PTSD  Observations/Objective: Check In: Case Manager checked in with all participants to review discharge dates, insurance authorizations, work-related documents and needs from the treatment team regarding medications. Client stated needs and engaged in discussion. Case Manager introduced 1 new Client to the group, with group members starting the joining process.   Initial Therapeutic Activity: Counselor facilitated therapeutic processing with group members to assess mood and current functioning, prompting group members to share about application of skills, progress and challenges in treatment/personal lives. Topics covered: panic attacks, coping skills, grief work, childhood traumas, and medication management. Client engaged in discussions and shared application of skills. Client presents with severe depression and severe anxiety. Client denied any current SI/HI/psychosis.   Second Therapeutic Activity: Counselor prompted group members to compare and contrast their anxiety and depression. Counselor instructed group to write down symptoms, thoughts, feelings, behaviors, sensations, triggers and anything else connected with their anxiety and depression to see what was similar and different. Group members shared their results, discussing how they are impacted and themes in their reflections. Counselor prompted  group members to choose 2 or 3 symptoms to focus on improving or addressing throughout their group treatment process. Client engaged and participated well in the activity.    Check Out: Counselor prompted group members to choose a relaxation or self-care activity for the afternoon, as we covered heavy emotional topics in group. Counselor promoted self-compassion. Client endorsed safety plan to be followed to prevent safety issues.   Assessment and Plan: Clinician recommends that Client remain in IOP treatment to better manage mental health symptoms, stabilization and to address treatment plan goals. Clinician recommends adherence to crisis/safety plan, taking medications as prescribed, and following up with medical professionals if any issues arise.   Follow Up Instructions: Clinician will send Webex link for next session. The Client was advised to call back or seek an in-person evaluation if the symptoms worsen or if the condition fails to improve as anticipated.     I provided 180 minutes of non-face-to-face time during this encounter.     Lise Auer, LCSW
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
## 2020-12-18 ENCOUNTER — Telehealth: Payer: Self-pay

## 2020-12-18 ENCOUNTER — Telehealth (HOSPITAL_COMMUNITY): Payer: Self-pay | Admitting: Psychiatry

## 2020-12-18 ENCOUNTER — Other Ambulatory Visit: Payer: Self-pay

## 2020-12-18 ENCOUNTER — Encounter (HOSPITAL_COMMUNITY): Payer: Self-pay

## 2020-12-18 ENCOUNTER — Other Ambulatory Visit (HOSPITAL_COMMUNITY): Payer: 59 | Admitting: Psychiatry

## 2020-12-18 DIAGNOSIS — Z87891 Personal history of nicotine dependence: Secondary | ICD-10-CM | POA: Diagnosis not present

## 2020-12-18 DIAGNOSIS — Z880 Allergy status to penicillin: Secondary | ICD-10-CM | POA: Diagnosis not present

## 2020-12-18 DIAGNOSIS — F332 Major depressive disorder, recurrent severe without psychotic features: Secondary | ICD-10-CM | POA: Diagnosis not present

## 2020-12-18 DIAGNOSIS — Z79899 Other long term (current) drug therapy: Secondary | ICD-10-CM | POA: Diagnosis not present

## 2020-12-18 DIAGNOSIS — F411 Generalized anxiety disorder: Secondary | ICD-10-CM | POA: Diagnosis not present

## 2020-12-18 DIAGNOSIS — F519 Sleep disorder not due to a substance or known physiological condition, unspecified: Secondary | ICD-10-CM | POA: Diagnosis not present

## 2020-12-18 DIAGNOSIS — F4310 Post-traumatic stress disorder, unspecified: Secondary | ICD-10-CM | POA: Diagnosis not present

## 2020-12-18 DIAGNOSIS — G4700 Insomnia, unspecified: Secondary | ICD-10-CM | POA: Diagnosis not present

## 2020-12-18 DIAGNOSIS — Z881 Allergy status to other antibiotic agents status: Secondary | ICD-10-CM | POA: Diagnosis not present

## 2020-12-18 NOTE — Progress Notes (Signed)
Virtual Visit via Video Note  I connected with Caroline Gonzales on 12/18/20 at  9:00 AM EDT by a video enabled telemedicine application and verified that I am speaking with the correct person using two identifiers.  At orientation to the IOP program, Case Manager discussed the limitations of evaluation and management by telemedicine and the availability of in person appointments. The patient expressed understanding and agreed to proceed with virtual visits throughout the duration of the program.   Location:  Patient: Patient Home Provider: Home Office   History of Present Illness: MDD and PTSD  Observations/Objective: Check In: Case Manager checked in with all participants to review discharge dates, insurance authorizations, work-related documents and needs from the treatment team regarding medications. Client stated needs and engaged in discussion. Case Manager introduced 1 new Client to the group, with group members starting the joining process.   Initial Therapeutic Activity: Counselor introduced our guest speaker, Einar Grad, [facility] Pharmacist, who shared about psychiatric medications, side effects, treatment considerations and how to communicate with medical professionals. Group Members asked questions and shared medication concerns. Counselor prompted group members to reference a worksheet called, "Body Scan" to jot down questions and concerns about their physical health in preparation for their upcoming appointments with medical professionals. Client shared about medical issues that recently became associated with past traumatic events.  Counselor encouraged routine medical check-ups, preparing for appointments, following up with recommendations and seeking specialist if needed.   Second Therapeutic Activity: Counselor facilitated therapeutic processing with group members to assess mood and current functioning, prompting group members to share about application of skills, progress and  challenges in treatment/personal lives. Topics covered: panic attacks, coping skills, grief work, childhood traumas, and medication management. Client engaged in discussions and shared application of skills. Client presents with moderate depression and severe anxiety. Client denied any current SI/HI/psychosis.   Check Out: Counselor closed program by allowing time to celebrate a graduating group member. Counselor shared reflections on progress and allow space for group members to share well wishes and appreciates to the graduating client. Counselor prompted graduating client to share takeaways, reflect on progress and final thoughts for the group. Counselor prompted group members to choose a relaxation or self-care activity for the afternoon, as we covered heavy emotional topics in group. Counselor promoted self-compassion. Client endorsed safety plan to be followed to prevent safety issues.   Assessment and Plan: Clinician recommends that Client step up to PHP treatment to better manage mental health symptoms, stabilization and to address treatment plan goals. Clinician recommends adherence to crisis/safety plan, taking medications as prescribed, and following up with medical professionals if any issues arise.   Follow Up Instructions: Clinician will send Webex link for next session. The Client was advised to call back or seek an in-person evaluation if the symptoms worsen or if the condition fails to improve as anticipated.     I provided 180 minutes of non-face-to-face time during this encounter.     Lise Auer, LCSW
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
## 2020-12-18 NOTE — Telephone Encounter (Signed)
D:  Apparently, pt stated to Ricky Ala, NP that MH-IOP is too basic for her.  "I have learned and know all the skills that are being taught."  Tanika discussed PHP and or [place] with pt.  A:  Pt will transition to [facility] tomorrow.  Ludger Nutting will check in on patient 12-20-20 to see if she would like to continue or be referred for a [place] Consultation.  Case mgr informed Lorin Glass, LCSW, Donnal Moat, LCSW and [place], [facility].  R:  Pt receptive.

## 2020-12-18 NOTE — Telephone Encounter (Signed)
Contacted pt to get dates for her IOP to complete FMLA, pt reports they are taking her out of that and she is to begin Partial Inpatient treatment tomorrow. She is unclear how long that will last, so she will contact me with that information. Her start date of being out is 12/14/2020, Saturday. They also discussed with her the option of [place], which she had not heard of, she did look it up and read about it but was wondering if Helene Kelp could talk to her about it. She's unsure if that is something she really wants to pursue.

## 2020-12-19 ENCOUNTER — Other Ambulatory Visit: Payer: Self-pay

## 2020-12-19 ENCOUNTER — Other Ambulatory Visit (HOSPITAL_COMMUNITY): Payer: 59 | Admitting: Licensed Clinical Social Worker

## 2020-12-19 ENCOUNTER — Encounter (HOSPITAL_COMMUNITY): Payer: Self-pay

## 2020-12-19 ENCOUNTER — Other Ambulatory Visit (HOSPITAL_COMMUNITY): Payer: 59 | Admitting: Occupational Therapy

## 2020-12-19 ENCOUNTER — Other Ambulatory Visit (HOSPITAL_COMMUNITY): Payer: 59

## 2020-12-19 DIAGNOSIS — Z87891 Personal history of nicotine dependence: Secondary | ICD-10-CM | POA: Diagnosis not present

## 2020-12-19 DIAGNOSIS — Z881 Allergy status to other antibiotic agents status: Secondary | ICD-10-CM | POA: Diagnosis not present

## 2020-12-19 DIAGNOSIS — R41844 Frontal lobe and executive function deficit: Secondary | ICD-10-CM

## 2020-12-19 DIAGNOSIS — F411 Generalized anxiety disorder: Secondary | ICD-10-CM | POA: Diagnosis not present

## 2020-12-19 DIAGNOSIS — F4310 Post-traumatic stress disorder, unspecified: Secondary | ICD-10-CM

## 2020-12-19 DIAGNOSIS — Z880 Allergy status to penicillin: Secondary | ICD-10-CM | POA: Diagnosis not present

## 2020-12-19 DIAGNOSIS — F332 Major depressive disorder, recurrent severe without psychotic features: Secondary | ICD-10-CM | POA: Diagnosis not present

## 2020-12-19 DIAGNOSIS — F519 Sleep disorder not due to a substance or known physiological condition, unspecified: Secondary | ICD-10-CM | POA: Diagnosis not present

## 2020-12-19 DIAGNOSIS — Z79899 Other long term (current) drug therapy: Secondary | ICD-10-CM | POA: Diagnosis not present

## 2020-12-19 DIAGNOSIS — G4700 Insomnia, unspecified: Secondary | ICD-10-CM | POA: Diagnosis not present

## 2020-12-19 DIAGNOSIS — R4589 Other symptoms and signs involving emotional state: Secondary | ICD-10-CM

## 2020-12-19 NOTE — Therapy (Signed)
[place], [place], [address] Phone: [phone]   Fax:  [phone] Virtual Visit via Video Note  I connected with Caroline Gonzales on 12/19/20 at  11:00 AM EDT by a video enabled telemedicine application and verified that I am speaking with the correct person using two identifiers.  Location: Patient: Patient Home Provider: Clinic Office   I discussed the limitations of evaluation and management by telemedicine and the availability of in person appointments. The patient expressed understanding and agreed to proceed.   I discussed the assessment and treatment plan with the patient. The patient was provided an opportunity to ask questions and all were answered. The patient agreed with the plan and demonstrated an understanding of the instructions.   The patient was advised to call back or seek an in-person evaluation if the symptoms worsen or if the condition fails to improve as anticipated.  I provided 80 minutes of non-face-to-face time during this encounter. 20 minutes OT Evaluation 60 minutes OT Group Therapy  Caroline Gonzales, OT   Occupational Therapy Evaluation  Patient Details  Name: Caroline Gonzales MRN: [id number] Date of Birth: 10-18-1975 Referring Provider (OT): Ricky Ala   Encounter Date: 12/19/2020   OT End of Session - 12/19/20 1337    Visit Number 2    Number of Visits 20    Date for OT Re-Evaluation 01/16/21    Authorization Type [employer]    OT Start Time 1100   OT Evaluation [phone]   OT Stop Time 1200    OT Time Calculation (min) 60 min    Activity Tolerance Patient tolerated treatment well    Behavior During Therapy [facility] for tasks assessed/performed           Past Medical History:  Diagnosis Date  . Allergy    Zyrtec, Allegra.  . Anxiety    followed by Dr. Toy Cookey  . Back pain   . Bipolar 1 disorder ([place])   . Complication of anesthesia   .  Depression   . Fibroid   . Gallbladder problem   . Gastroparesis 09/14/2012   gastric emptying study in 2014  . GERD (gastroesophageal reflux disease)   . Headache   . Pneumonia    2013ish  . PONV (postoperative nausea and vomiting)   . Pre-diabetes   . PTSD (post-traumatic stress disorder)   . Sleep apnea    do not use a cpap  . Tinnitus   . Vitamin D deficiency   . Wears glasses     Past Surgical History:  Procedure Laterality Date  . ANKLE ARTHROSCOPY Left 2011  . CESAREAN SECTION    . CHOLECYSTECTOMY    . DILATION AND CURETTAGE OF UTERUS    . FOREIGN BODY REMOVAL Left 11/18/2016   Procedure: REMOVAL FOREIGN BODY EXTREMITY LEFT FOOT;  Surgeon: Caroline Gonzales, DPM;  Location: [facility];  Service: Podiatry;  Laterality: Left;  . PILONIDAL CYST EXCISION  1990's  . RADIOLOGY WITH ANESTHESIA N/A 11/10/2018   Procedure: MRI WITH ANESTHESIA OF BRAIN AND ORBITS WITH AND WITHOUT CONTRAST;  Surgeon: Radiologist, Medication, MD;  Location: [facility];  Service: Radiology;  Laterality: N/A;  . TENDON REPAIR Left 2011   Left Ankle  . WISDOM TOOTH EXTRACTION  19090's    There were no vitals filed for this visit.   Subjective Assessment - 12/19/20 1336    Currently in Pain? No/denies    Pain Score 0-No pain  [facility] OT Assessment - 12/19/20 0001      Assessment   Medical Diagnosis PTSD    Referring Provider (OT) Ricky Ala      Precautions   Precautions None      Balance Screen   Has the patient fallen in the past 6 months No    Has the patient had a decrease in activity level because of a fear of falling?  No    Is the patient reluctant to leave their home because of a fear of falling?  No             OT Education - 12/19/20 1336    Education Details Educated on OT within [facility] programming in addition to concept of sensory modulation and self-soothing as coping strategies through use of the eight senses    Person(s) Educated Patient    Methods Explanation;Handout     Comprehension Verbalized understanding            OT Short Term Goals - 12/19/20 1341      OT SHORT TERM GOAL #1   Title Pt will actively engage in OT group sessions throughout duration of PHP programming, in order to promote daily structure, social engagement, and opportunities to develop and utilize adaptive strategies to maximize functional performance in preparation for safe transition and integration back into school, work, and the community.    Time 4    Period Weeks    Status New    Target Date 01/16/21      OT SHORT TERM GOAL #2   Title Pt will identify a minimum of two self-soothing relaxation strategies she can utilize in the moment, to safely manage increased anxiety/PTSD flashbacks, without engaging in self-harm behaviors,  in preparation for safe community reintegration.    Time 4    Period Weeks    Status New    Target Date 01/16/21      OT SHORT TERM GOAL #3   Title Pt will identify 1-3 strategies to increase social participation, in order to promote healthy socialization and community reintegration, in preparation for discharge.    Time 4    Period Weeks    Status New    Target Date 01/16/21         Occupational Therapy Assessment 12/19/2020  Caroline Gonzales is a 45 y/o female with PMHx of PTSD, depression, and anxiety, who was referred and stepped down from the [facility] program. 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 Pt reports a desire to engage in [facility] programming, in order to manage identified stressors and to engage meaningfully in identified areas of occupation and ADL/iADLs. Upon approach, pt presents as anxious, reports this, and states she is unsure about group  programming, though open to giving it a try. Pt reports enjoying painting her nails, watching TV and movies, and pop culture. Precautions/Limitations: None noted/observed  Cognition: Appears intact   Visual Motor: Pt wears glasses, is observed wearing them during virtual session   Living Situation: Pt lives at home with her husband and 47 y/o son  School/Work: Pt works FT as a Therapist, sports in palliative care at [employer] - reports desire to go into different field of nursing, not patient facing, as  end of life care has become "too much"   ADL/iADL Performance: Pt reports difficulty with sleep, reports having nightmares and over eating.    Leisure Interests and Hobbies: Enjoys watching TV and movies, pop culture, and painting her nails  Social Support: Identifies support from her husband and son; reports she does not talk to her sister, difficult relationship with Mom, Dad is supportive, though all are in [place]. Husband and son are here with her in [place].   What do you do when you are very stressed, angry, upset, sad or anxious? Hurt myself (self-harm), Talk to someone, Sleep and Take medication   What helps when you are not feeling well? Deep breathing, Additional/Extra medication, Taking a shower or bath, Calling a friend or family member, Watching TV, Eating something and Pacing  What are some things that make it MORE difficult for you when you are already upset? Being alone/isolated, Noise (in general), Bedroom door being open, Being criticized, Yelling and Sensory (Items/Objects touching her)  Is there anything specific that you would like help with while you're in the partial hospitalization program? Self-Harm Urges and Nutrition   Assessment: Pt demonstrates behavior that inhibits/restricts participation in occupation and would benefit from skilled occupational therapy services to address current difficulties with symptom management, emotion regulation, socialization, stress management, time  management, job readiness, financial wellness, health and nutrition, sleep hygiene, ADL/iADL performance and leisure participation, in preparation for reintegration and return to community at discharge.   Plan: Pt will participate in skilled occupational therapy sessions (group and/or individual) in order to promote daily structure, social engagement, and opportunities to develop and utilize adaptive strategies to maximize functional performance in preparation for safe transition and integration back into school, work, and/or the community at discharge. OT sessions will occur 4-5 x per week for 2-4 weeks.   Caroline Gonzales, MOT, OTR/L  Group Session:  S: "My son has sensory processing disorder, so I know a lot about it and I think he might have gotten it from me, but I am not formally diagnosed"  O: Today's group session focused on topic of sensory modulation and self-soothing through use of the 8 senses. Discussion introduced the concept of sensory modulation and integration, focusing on how we can utilize our body and it's senses to self-soothe or cope, when we are experiencing an over or under-whelming sensation or feeling. Group members were introduced to a sensory diet checklist as a helpful tool/resource that can be utilized to identify what activities and strategies we prefer and do not prefer based upon our response to different stimulus. The concept of alerting vs calming activities was also introduced to understand how to counteract how we are feeling (Example: when we are feeling overwhelmed/stressed, engage in something calming. When we are feeling depressed/low energy, engage in something alerting). Group members engaged actively in discussion sharing their own personal sensory likes/dislikes.    A: Jazlyne was active in her participation of discussion and activity, sharing that her son has sensory processing disorder and she has a lot of background of what bothers him and how he  self-soothes. Pt shared that she thinks that she may have sensory issues related to her trauma and PTSD, however has not been formally diagnosed or worked through it. Pt was widely receptive to education and information received, expressing strong interest in material. Pt identified doodling and pacing help her to focus and self soothe through active movement.   P: Continue to attend PHP OT group sessions 5x week for  4 weeks to promote daily structure, social engagement, and opportunities to develop and utilize adaptive strategies to maximize functional performance in preparation for safe transition and integration back into school, work, and the community. Continue to address topic of sensory modulation in next OT group session.   Plan - 12/19/20 1338    Clinical Impression Statement Caroline Gonzales is a 45 y/o female with PMHx of PTSD, depression, and anxiety, who was referred and stepped down from the [facility] program. 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 Pt reports a desire to engage in [facility] programming, in order to manage identified stressors and to engage meaningfully in identified areas of occupation and ADL/iADLs.    OT Occupational Profile and History Problem Focused Assessment - Including review of records relating to presenting problem    Occupational performance deficits (Please refer to evaluation for details): ADL's;IADL's;Rest and Sleep;Education;Work;Leisure;Social Participation    Body Structure / Function / Physical Skills ADL    Cognitive Skills Attention;Emotional;Energy/Drive;Learn;Memory;Perception;Problem Solve;Safety Awareness;Temperament/Personality;Thought;Understand    Psychosocial  Skills Coping Strategies;Environmental  Adaptations;Habits;Interpersonal Interaction;Routines and Behaviors    Rehab Potential Good    Clinical Decision Making Limited treatment options, no task modification necessary    Comorbidities Affecting Occupational Performance: May have comorbidities impacting occupational performance    Modification or Assistance to Complete Evaluation  No modification of tasks or assist necessary to complete eval    OT Frequency 5x / week    OT Duration 4 weeks    OT Treatment/Interventions Self-care/ADL training;Patient/family education;Coping strategies training;Psychosocial skills training    Consulted and Agree with Plan of Care Patient           Patient will benefit from skilled therapeutic intervention in order to improve the following deficits and impairments:   Body Structure / Function / Physical Skills: ADL Cognitive Skills: Attention,Emotional,Energy/Drive,Learn,Memory,Perception,Problem Solve,Safety Awareness,Temperament/Personality,Thought,Understand Psychosocial Skills: Coping Strategies,Environmental  Adaptations,Habits,Interpersonal Interaction,Routines and Behaviors   Visit Diagnosis: Difficulty coping  Frontal lobe and executive function deficit  PTSD (post-traumatic stress disorder)    Problem List Patient Active Problem List   Diagnosis Date Noted  . PTSD (post-traumatic stress disorder) 07/21/2018  . GAD (generalized anxiety disorder) 07/21/2018  . Insomnia 07/21/2018  . MDD (major depressive disorder) 07/21/2018  . Nightmares 07/21/2018  . Insulin resistance 10/21/2017  . Other hyperlipidemia 10/07/2017  . Vitamin D deficiency 10/07/2017  . Class 3 obesity with serious comorbidity and body mass index (BMI) of 40.0 to 44.9 in adult 04/27/2017  . Residual foreign body in soft tissue 11/12/2016  . Prediabetes 11/02/2016  . Morbid obesity ([place]) 10/15/2016    12/19/2020  Caroline Gonzales, MOT, OTR/L  12/19/2020, 1:44 PM  [facility] HOSPITALIZATION PROGRAM [place], [place], [address] Phone: [phone]   Fax:  [phone]  Name: JEANNIFER DRAKEFORD MRN: [id number] Date of Birth: 1976/06/16

## 2020-12-20 ENCOUNTER — Other Ambulatory Visit (HOSPITAL_COMMUNITY): Payer: 59 | Admitting: Licensed Clinical Social Worker

## 2020-12-20 ENCOUNTER — Other Ambulatory Visit: Payer: Self-pay

## 2020-12-20 ENCOUNTER — Encounter (HOSPITAL_COMMUNITY): Payer: Self-pay | Admitting: Family

## 2020-12-20 ENCOUNTER — Other Ambulatory Visit (HOSPITAL_COMMUNITY): Payer: 59 | Admitting: Occupational Therapy

## 2020-12-20 ENCOUNTER — Ambulatory Visit (HOSPITAL_COMMUNITY): Payer: 59

## 2020-12-20 ENCOUNTER — Encounter (HOSPITAL_COMMUNITY): Payer: Self-pay

## 2020-12-20 DIAGNOSIS — R4589 Other symptoms and signs involving emotional state: Secondary | ICD-10-CM

## 2020-12-20 DIAGNOSIS — F4310 Post-traumatic stress disorder, unspecified: Secondary | ICD-10-CM

## 2020-12-20 DIAGNOSIS — Z87891 Personal history of nicotine dependence: Secondary | ICD-10-CM | POA: Diagnosis not present

## 2020-12-20 DIAGNOSIS — F519 Sleep disorder not due to a substance or known physiological condition, unspecified: Secondary | ICD-10-CM | POA: Diagnosis not present

## 2020-12-20 DIAGNOSIS — R41844 Frontal lobe and executive function deficit: Secondary | ICD-10-CM

## 2020-12-20 DIAGNOSIS — Z79899 Other long term (current) drug therapy: Secondary | ICD-10-CM | POA: Diagnosis not present

## 2020-12-20 DIAGNOSIS — Z881 Allergy status to other antibiotic agents status: Secondary | ICD-10-CM | POA: Diagnosis not present

## 2020-12-20 DIAGNOSIS — Z880 Allergy status to penicillin: Secondary | ICD-10-CM | POA: Diagnosis not present

## 2020-12-20 DIAGNOSIS — F332 Major depressive disorder, recurrent severe without psychotic features: Secondary | ICD-10-CM | POA: Diagnosis not present

## 2020-12-20 DIAGNOSIS — F411 Generalized anxiety disorder: Secondary | ICD-10-CM | POA: Diagnosis not present

## 2020-12-20 DIAGNOSIS — F322 Major depressive disorder, single episode, severe without psychotic features: Secondary | ICD-10-CM

## 2020-12-20 DIAGNOSIS — G4700 Insomnia, unspecified: Secondary | ICD-10-CM | POA: Diagnosis not present
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
## 2020-12-20 NOTE — Therapy (Signed)
[place], [place], [address] Phone: [phone]   Fax:  [phone] Virtual Visit via Video Note  I connected with Caroline Gonzales on 12/20/20 at  11:00 AM EDT by a video enabled telemedicine application and verified that I am speaking with the correct person using two identifiers.  Location: Patient: Patient Home Provider: Clinic Office   I discussed the limitations of evaluation and management by telemedicine and the availability of in person appointments. The patient expressed understanding and agreed to proceed.   I discussed the assessment and treatment plan with the patient. The patient was provided an opportunity to ask questions and all were answered. The patient agreed with the plan and demonstrated an understanding of the instructions.   The patient was advised to call back or seek an in-person evaluation if the symptoms worsen or if the condition fails to improve as anticipated.  I provided 60 minutes of non-face-to-face time during this encounter.   Ponciano Ort, OT   Occupational Therapy Treatment  Patient Details  Name: Caroline Gonzales MRN: [id number] Date of Birth: [birth year] Referring Provider (OT): Ricky Ala   Encounter Date: 12/20/2020   OT End of Session - 12/20/20 1436    Visit Number 3    Number of Visits 20    Date for OT Re-Evaluation 01/16/21    Authorization Type [employer]    OT Start Time 1105    OT Stop Time 1205    OT Time Calculation (min) 60 min    Activity Tolerance Patient tolerated treatment well    Behavior During Therapy [facility] for tasks assessed/performed           Past Medical History:  Diagnosis Date  . Allergy    Zyrtec, Allegra.  . Anxiety    followed by Dr. Toy Cookey  . Back pain   . Bipolar 1 disorder ([place])   . Complication of anesthesia   . Depression   . Fibroid   . Gallbladder problem   . Gastroparesis 09/14/2012    gastric emptying study in 2014  . GERD (gastroesophageal reflux disease)   . Headache   . Pneumonia    2013ish  . PONV (postoperative nausea and vomiting)   . Pre-diabetes   . PTSD (post-traumatic stress disorder)   . Sleep apnea    do not use a cpap  . Tinnitus   . Vitamin D deficiency   . Wears glasses     Past Surgical History:  Procedure Laterality Date  . ANKLE ARTHROSCOPY Left 2011  . CESAREAN SECTION    . CHOLECYSTECTOMY    . DILATION AND CURETTAGE OF UTERUS    . FOREIGN BODY REMOVAL Left 11/18/2016   Procedure: REMOVAL FOREIGN BODY EXTREMITY LEFT FOOT;  Surgeon: Trula Slade, DPM;  Location: [place];  Service: Podiatry;  Laterality: Left;  . PILONIDAL CYST EXCISION  1990's  . RADIOLOGY WITH ANESTHESIA N/A 11/10/2018   Procedure: MRI WITH ANESTHESIA OF BRAIN AND ORBITS WITH AND WITHOUT CONTRAST;  Surgeon: Radiologist, Medication, MD;  Location: [place] [facility];  Service: Radiology;  Laterality: N/A;  . TENDON REPAIR Left 2011   Left Ankle  . WISDOM TOOTH EXTRACTION  [address]90's    There were no vitals filed for this visit.   Subjective Assessment - 12/20/20 1435    Currently in Pain? No/denies    Pain Score 0-No pain           OT Education -  12/20/20 1436    Education Details Continued education on concept of sensory modulation and self-soothing as coping strategies through use of the eight senses    Person(s) Educated Patient    Methods Explanation;Handout    Comprehension Verbalized understanding            OT Short Term Goals - 12/20/20 1437      OT SHORT TERM GOAL #1   Status On-going      OT SHORT TERM GOAL #2   Status On-going      OT SHORT TERM GOAL #3   Status On-going         Group Session:  S: "I recently started getting hot flashes, but I have to sleep very particular with nothing on and my sheet free of wrinkles.  O: Today's group session continued discussion on topic of sensory modulation and self-soothing through use of the 8 senses.  Discussion introduced the concept of sensory modulation and integration, focusing on how we can utilize our body and it's senses to self-soothe or cope, when we are experiencing an over or under-whelming sensation or feeling. Group members were introduced to a sensory diet checklist as a helpful tool/resource that can be utilized to identify what activities and strategies we prefer and do not prefer based upon our response to different stimulus. The concept of alerting vs calming activities was also introduced to understand how to counteract how we are feeling (Example: when we are feeling overwhelmed/stressed, engage in something calming. When we are feeling depressed/low energy, engage in something alerting). Group members engaged actively in discussion sharing their own personal sensory likes/dislikes.    A: Caroline Gonzales was active in her participation of discussion and activity, sharing several sensory preferences and dislikes, expanding on what she identifies as calming vs alerting. She shared calming sensory activities for her include cuddling with her cat, painting her nails, using oil for her nails, sleeping with a box fan, watching movies and TV. She identified dislikes/triggering sensory experiences as going barefoot, sauces on her food, certain food textures, white foods, and sleeping with clothes on. Receptive to further education and was able to offer support and feedback to other peer in group with similar preferences and experiences.   P: Continue to attend PHP OT group sessions 5x week for 2 weeks to promote daily structure, social engagement, and opportunities to develop and utilize adaptive strategies to maximize functional performance in preparation for safe transition and integration back into school, work, and the community.   Plan - 12/20/20 1437    Occupational performance deficits (Please refer to evaluation for details): ADL's;IADL's;Rest and Sleep;Education;Work;Leisure;Social  Participation    Body Structure / Function / Physical Skills ADL    Cognitive Skills Attention;Emotional;Energy/Drive;Learn;Memory;Perception;Problem Solve;Safety Awareness;Temperament/Personality;Thought;Understand    Psychosocial Skills Coping Strategies;Environmental  Adaptations;Habits;Interpersonal Interaction;Routines and Behaviors           Patient will benefit from skilled therapeutic intervention in order to improve the following deficits and impairments:   Body Structure / Function / Physical Skills: ADL Cognitive Skills: Attention,Emotional,Energy/Drive,Learn,Memory,Perception,Problem Solve,Safety Awareness,Temperament/Personality,Thought,Understand Psychosocial Skills: Coping Strategies,Environmental  Adaptations,Habits,Interpersonal Interaction,Routines and Behaviors   Visit Diagnosis: Difficulty coping  Frontal lobe and executive function deficit  PTSD (post-traumatic stress disorder)    Problem List Patient Active Problem List   Diagnosis Date Noted  . PTSD (post-traumatic stress disorder) 07/21/2018  . GAD (generalized anxiety disorder) 07/21/2018  . Insomnia 07/21/2018  . MDD (major depressive disorder) 07/21/2018  . Nightmares 07/21/2018  . Insulin resistance 10/21/2017  .  Other hyperlipidemia 10/07/2017  . Vitamin D deficiency 10/07/2017  . Class 3 obesity with serious comorbidity and body mass index (BMI) of 40.0 to 44.9 in adult 04/27/2017  . Residual foreign body in soft tissue 11/12/2016  . Prediabetes 11/02/2016  . Morbid obesity ([place]) 10/15/2016    12/20/2020  Ponciano Ort, MOT, OTR/L  12/20/2020, 2:42 PM  [facility] HOSPITALIZATION PROGRAM [place], [place], [address] Phone: [phone]   Fax:  [phone]  Name: Caroline Gonzales MRN: [id number] Date of Birth: 16-Aug-1976

## 2020-12-20 NOTE — Telephone Encounter (Signed)
Left message on her voicemail that I will try to contact her again next week.

## 2020-12-20 NOTE — Telephone Encounter (Signed)
Pt left me a msg that she has just finished her 2nd day of IOP and it's going really well, she will continue that all of next week M-F and the following week. So that is her continuous leave. Then she will go back and have intermittent for starting [place], which she reports will have daily for 30 days.  Will update her FMLA and have Helene Kelp review on Monday.

## 2020-12-23 ENCOUNTER — Other Ambulatory Visit: Payer: Self-pay

## 2020-12-23 ENCOUNTER — Ambulatory Visit (HOSPITAL_COMMUNITY): Payer: 59

## 2020-12-23 ENCOUNTER — Other Ambulatory Visit (HOSPITAL_COMMUNITY): Payer: 59 | Admitting: Occupational Therapy

## 2020-12-23 ENCOUNTER — Encounter (HOSPITAL_COMMUNITY): Payer: Self-pay

## 2020-12-23 ENCOUNTER — Other Ambulatory Visit (HOSPITAL_COMMUNITY): Payer: 59 | Admitting: Licensed Clinical Social Worker

## 2020-12-23 DIAGNOSIS — F4310 Post-traumatic stress disorder, unspecified: Secondary | ICD-10-CM | POA: Diagnosis not present

## 2020-12-23 DIAGNOSIS — G4700 Insomnia, unspecified: Secondary | ICD-10-CM | POA: Diagnosis not present

## 2020-12-23 DIAGNOSIS — F519 Sleep disorder not due to a substance or known physiological condition, unspecified: Secondary | ICD-10-CM | POA: Diagnosis not present

## 2020-12-23 DIAGNOSIS — F332 Major depressive disorder, recurrent severe without psychotic features: Secondary | ICD-10-CM

## 2020-12-23 DIAGNOSIS — R41844 Frontal lobe and executive function deficit: Secondary | ICD-10-CM

## 2020-12-23 DIAGNOSIS — R4589 Other symptoms and signs involving emotional state: Secondary | ICD-10-CM

## 2020-12-23 DIAGNOSIS — Z881 Allergy status to other antibiotic agents status: Secondary | ICD-10-CM | POA: Diagnosis not present

## 2020-12-23 DIAGNOSIS — F411 Generalized anxiety disorder: Secondary | ICD-10-CM | POA: Diagnosis not present

## 2020-12-23 DIAGNOSIS — Z880 Allergy status to penicillin: Secondary | ICD-10-CM | POA: Diagnosis not present

## 2020-12-23 DIAGNOSIS — Z79899 Other long term (current) drug therapy: Secondary | ICD-10-CM | POA: Diagnosis not present

## 2020-12-23 DIAGNOSIS — Z87891 Personal history of nicotine dependence: Secondary | ICD-10-CM | POA: Diagnosis not present
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
## 2020-12-23 NOTE — Telephone Encounter (Signed)
I think she changed from IOP to PHP, I'm glad to hear she's doing better! Yes, whatever date she needs for continuous and then intermittent is fine.  Do you know if she still wants me to call her?

## 2020-12-23 NOTE — Therapy (Signed)
[place], [place], [address] Phone: [phone]   Fax:  [phone] Virtual Visit via Video Note  I connected with Caroline Gonzales on 12/23/20 at  12:00 PM EDT by a video enabled telemedicine application and verified that I am speaking with the correct person using two identifiers.  Location: Patient: Patient Home Provider: Clinic Office   I discussed the limitations of evaluation and management by telemedicine and the availability of in person appointments. The patient expressed understanding and agreed to proceed.   I discussed the assessment and treatment plan with the patient. The patient was provided an opportunity to ask questions and all were answered. The patient agreed with the plan and demonstrated an understanding of the instructions.   The patient was advised to call back or seek an in-person evaluation if the symptoms worsen or if the condition fails to improve as anticipated.  I provided 55 minutes of non-face-to-face time during this encounter.   Ponciano Ort, OT   Occupational Therapy Treatment  Patient Details  Name: Caroline Gonzales MRN: [id number] Date of Birth: 06-Oct-1975 Referring Provider (OT): Ricky Ala   Encounter Date: 12/23/2020   OT End of Session - 12/23/20 1315    Visit Number 4    Number of Visits 20    Date for OT Re-Evaluation 01/16/21    Authorization Type [employer] Employee [employer]    OT Start Time 1200    OT Stop Time 1255    OT Time Calculation (min) 55 min    Activity Tolerance Patient tolerated treatment well    Behavior During Therapy [facility] for tasks assessed/performed           Past Medical History:  Diagnosis Date  . Allergy    Zyrtec, Allegra.  . Anxiety    followed by Dr. Toy Cookey  . Back pain   . Bipolar 1 disorder ([place])   . Complication of anesthesia   . Depression   . Fibroid   . Gallbladder problem   . Gastroparesis 09/14/2012    gastric emptying study in 2014  . GERD (gastroesophageal reflux disease)   . Headache   . Pneumonia    2013ish  . PONV (postoperative nausea and vomiting)   . Pre-diabetes   . PTSD (post-traumatic stress disorder)   . Sleep apnea    do not use a cpap  . Tinnitus   . Vitamin D deficiency   . Wears glasses     Past Surgical History:  Procedure Laterality Date  . ANKLE ARTHROSCOPY Left 2011  . CESAREAN SECTION    . CHOLECYSTECTOMY    . DILATION AND CURETTAGE OF UTERUS    . FOREIGN BODY REMOVAL Left 11/18/2016   Procedure: REMOVAL FOREIGN BODY EXTREMITY LEFT FOOT;  Surgeon: Trula Slade, DPM;  Location: [place];  Service: Podiatry;  Laterality: Left;  . PILONIDAL CYST EXCISION  1990's  . RADIOLOGY WITH ANESTHESIA N/A 11/10/2018   Procedure: MRI WITH ANESTHESIA OF BRAIN AND ORBITS WITH AND WITHOUT CONTRAST;  Surgeon: Radiologist, Medication, MD;  Location: [place];  Service: Radiology;  Laterality: N/A;  . TENDON REPAIR Left 2011   Left Ankle  . WISDOM TOOTH EXTRACTION  [address]'s    There were no vitals filed for this visit.   Subjective Assessment - 12/23/20 1315    Currently in Pain? No/denies            OT Education - 12/23/20 1315  Education Details Educated on fight-or-flight response and use of relaxation strategies including deep breathing, guided imagery, and PMR    Person(s) Educated Patient    Methods Explanation;Handout    Comprehension Verbalized understanding            OT Short Term Goals - 12/20/20 1437      OT SHORT TERM GOAL #1   Status On-going      OT SHORT TERM GOAL #2   Status On-going      OT SHORT TERM GOAL #3   Status On-going         Group Session:  S: "I like to take a bath and use bath bombs, sometimes I play a game or read a book in there. My husband will cook dinner and bring it to me in the tub on really bad days."  O:  Group began with a warm-up activity and group members were encouraged to share and identify ways in which  they have practiced relaxation in the past, including any specific strategies or hobbies. Today's discussion focused on the topic of RELAXATION and patients reviewed and engaged in a variety of relaxation strategies techniques including deep breathing, guided imagery/meditation, and progressive muscle relaxation. After each strategy was reviewed, group members were invited to engage in an active practice of relaxation strategies identified. Review and background on the fight-or-flight response was also provided.   A: Caroline Gonzales was active in her participation of discussion and activity, sharing that most often, she likes to relax by taking a bath with a bath bomb and winding down for the night. She also shared ways in which breathing works for her, including picturing the number "99" and subsequently counting down from there. She also shared that she likes to picture static imagery of her self in a blue tube to relax. Pt was receptive to strategies offered during group by other peers, while also offering her own strategies and suggestions.   P: Continue to attend PHP OT group sessions 5x week for 2 weeks to promote daily structure, social engagement, and opportunities to develop and utilize adaptive strategies to maximize functional performance in preparation for safe transition and integration back into school, work, and the community. Plan to address topic of wellness in next OT group session.   Plan - 12/23/20 1315    Occupational performance deficits (Please refer to evaluation for details): ADL's;IADL's;Rest and Sleep;Education;Work;Leisure;Social Participation    Body Structure / Function / Physical Skills ADL    Cognitive Skills Attention;Emotional;Energy/Drive;Learn;Memory;Perception;Problem Solve;Safety Awareness;Temperament/Personality;Thought;Understand    Psychosocial Skills Coping Strategies;Environmental  Adaptations;Habits;Interpersonal Interaction;Routines and Behaviors           Patient  will benefit from skilled therapeutic intervention in order to improve the following deficits and impairments:   Body Structure / Function / Physical Skills: ADL Cognitive Skills: Attention,Emotional,Energy/Drive,Learn,Memory,Perception,Problem Solve,Safety Awareness,Temperament/Personality,Thought,Understand Psychosocial Skills: Coping Strategies,Environmental  Adaptations,Habits,Interpersonal Interaction,Routines and Behaviors   Visit Diagnosis: Difficulty coping  Frontal lobe and executive function deficit  PTSD (post-traumatic stress disorder)    Problem List Patient Active Problem List   Diagnosis Date Noted  . PTSD (post-traumatic stress disorder) 07/21/2018  . GAD (generalized anxiety disorder) 07/21/2018  . Insomnia 07/21/2018  . MDD (major depressive disorder) 07/21/2018  . Nightmares 07/21/2018  . Insulin resistance 10/21/2017  . Other hyperlipidemia 10/07/2017  . Vitamin D deficiency 10/07/2017  . Class 3 obesity with serious comorbidity and body mass index (BMI) of 40.0 to 44.9 in adult 04/27/2017  . Residual foreign body in soft tissue 11/12/2016  .  Prediabetes 11/02/2016  . Morbid obesity ([place]) 10/15/2016    12/23/2020  Ponciano Ort, MOT, OTR/L  12/23/2020, 1:16 PM  [facility] HOSPITALIZATION PROGRAM [place], [place], [address] Phone: [phone]   Fax:  [phone]  Name: Caroline Gonzales MRN: [id number] Date of Birth: 26-Jul-1976

## 2020-12-24 ENCOUNTER — Other Ambulatory Visit: Payer: Self-pay

## 2020-12-24 ENCOUNTER — Other Ambulatory Visit (HOSPITAL_COMMUNITY): Payer: 59 | Admitting: Occupational Therapy

## 2020-12-24 ENCOUNTER — Other Ambulatory Visit (HOSPITAL_COMMUNITY): Payer: 59 | Admitting: Licensed Clinical Social Worker

## 2020-12-24 ENCOUNTER — Encounter (HOSPITAL_COMMUNITY): Payer: Self-pay

## 2020-12-24 ENCOUNTER — Ambulatory Visit (HOSPITAL_COMMUNITY): Payer: 59

## 2020-12-24 DIAGNOSIS — Z79899 Other long term (current) drug therapy: Secondary | ICD-10-CM | POA: Diagnosis not present

## 2020-12-24 DIAGNOSIS — R41844 Frontal lobe and executive function deficit: Secondary | ICD-10-CM

## 2020-12-24 DIAGNOSIS — Z880 Allergy status to penicillin: Secondary | ICD-10-CM | POA: Diagnosis not present

## 2020-12-24 DIAGNOSIS — F519 Sleep disorder not due to a substance or known physiological condition, unspecified: Secondary | ICD-10-CM | POA: Diagnosis not present

## 2020-12-24 DIAGNOSIS — Z87891 Personal history of nicotine dependence: Secondary | ICD-10-CM | POA: Diagnosis not present

## 2020-12-24 DIAGNOSIS — F411 Generalized anxiety disorder: Secondary | ICD-10-CM | POA: Diagnosis not present

## 2020-12-24 DIAGNOSIS — F332 Major depressive disorder, recurrent severe without psychotic features: Secondary | ICD-10-CM | POA: Diagnosis not present

## 2020-12-24 DIAGNOSIS — F4310 Post-traumatic stress disorder, unspecified: Secondary | ICD-10-CM

## 2020-12-24 DIAGNOSIS — G4700 Insomnia, unspecified: Secondary | ICD-10-CM | POA: Diagnosis not present

## 2020-12-24 DIAGNOSIS — R4589 Other symptoms and signs involving emotional state: Secondary | ICD-10-CM

## 2020-12-24 DIAGNOSIS — Z881 Allergy status to other antibiotic agents status: Secondary | ICD-10-CM | POA: Diagnosis not present

## 2020-12-24 NOTE — Telephone Encounter (Signed)
I completed her FMLA with the dates she gave me. But then she will be doing TMS daily so I did not add that yet. Just her continuous for right now. Please review.

## 2020-12-24 NOTE — Therapy (Signed)
[place], [place], [address] Phone: [phone]   Fax:  [phone] Virtual Visit via Video Note  I connected with Caroline Gonzales on 12/24/20 at  11:00 AM EDT by a video enabled telemedicine application and verified that I am speaking with the correct person using two identifiers.  Location: Patient: Patient Home Provider: Clinic Office   I discussed the limitations of evaluation and management by telemedicine and the availability of in person appointments. The patient expressed understanding and agreed to proceed.  I discussed the assessment and treatment plan with the patient. The patient was provided an opportunity to ask questions and all were answered. The patient agreed with the plan and demonstrated an understanding of the instructions.   The patient was advised to call back or seek an in-person evaluation if the symptoms worsen or if the condition fails to improve as anticipated.  I provided 60 minutes of non-face-to-face time during this encounter.   Ponciano Ort, OT   Occupational Therapy Treatment  Patient Details  Name: Caroline Gonzales MRN: [id number] Date of Birth: [birth year] Referring Provider (OT): Ricky Ala   Encounter Date: 12/24/2020   OT End of Session - 12/24/20 1423    Visit Number 5    Number of Visits 20    Date for OT Re-Evaluation 01/16/21    Authorization Type [employer] Employee [employer] [facility] 1/1/-09/13/2021   Co Pay 20.00  Ind Deduct 300- amount met not availalbe   OOP 7900- amount met not available.    OT Start Time 1125    OT Stop Time 1225    OT Time Calculation (min) 60 min    Activity Tolerance Patient tolerated treatment well    Behavior During Therapy Anxious           Past Medical History:  Diagnosis Date  . Allergy    Zyrtec, Allegra.  . Anxiety    followed by Dr. Toy Cookey  . Back pain   . Bipolar 1 disorder ([place])   . Complication  of anesthesia   . Depression   . Fibroid   . Gallbladder problem   . Gastroparesis 09/14/2012   gastric emptying study in 2014  . GERD (gastroesophageal reflux disease)   . Headache   . Pneumonia    2013ish  . PONV (postoperative nausea and vomiting)   . Pre-diabetes   . PTSD (post-traumatic stress disorder)   . Sleep apnea    do not use a cpap  . Tinnitus   . Vitamin D deficiency   . Wears glasses     Past Surgical History:  Procedure Laterality Date  . ANKLE ARTHROSCOPY Left 2011  . CESAREAN SECTION    . CHOLECYSTECTOMY    . DILATION AND CURETTAGE OF UTERUS    . FOREIGN BODY REMOVAL Left 11/18/2016   Procedure: REMOVAL FOREIGN BODY EXTREMITY LEFT FOOT;  Surgeon: Trula Slade, DPM;  Location: [place];  Service: Podiatry;  Laterality: Left;  . PILONIDAL CYST EXCISION  1990's  . RADIOLOGY WITH ANESTHESIA N/A 11/10/2018   Procedure: MRI WITH ANESTHESIA OF BRAIN AND ORBITS WITH AND WITHOUT CONTRAST;  Surgeon: Radiologist, Medication, MD;  Location: [place];  Service: Radiology;  Laterality: N/A;  . TENDON REPAIR Left 2011   Left Ankle  . WISDOM TOOTH EXTRACTION  [address]'s    There were no vitals filed for this visit.   Subjective Assessment - 12/24/20 1421    Currently in  Pain? No/denies    Pain Score 0-No pain            OT Education - 12/24/20 1422    Education Details Educated on physical symptomology of stress and its effects on the body, along with positive stress management tips/strategies    Person(s) Educated Patient    Methods Explanation;Handout    Comprehension Verbalized understanding            OT Short Term Goals - 12/20/20 1437      OT SHORT TERM GOAL #1   Status On-going      OT SHORT TERM GOAL #2   Status On-going      OT SHORT TERM GOAL #3   Status On-going         Group Session:  S: "I know when I am done with all of this treatment, I am going to switch my positions. My job is too stressful and I cannot do it anymore."  O: Group began  with a check-in and review of previous OT session focused on sensory modulation and self-soothing with members sharing ways in which they coped over the previous afternoon. Today's discussion focused on the topic of stress management. Group members worked collaboratively to create a Environmental consultant identifying physical signs, behavioral signs, emotional/psychological, and cognitive signs of stress. Discussion then focused and encouraged group members to identify positive stress management strategies they could utilize in those moments to manage identified signs.   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 She was active in her discussion and gave examples of what stress looks like for her and shared that one thing that happens is that she becomes hypersensitive to textures and cannot stand the feeling of clothing on her body. As group progressed, pt left group abruptly, turning off her camera and sending a message to this writer that she felt "triggered" and was "crying uncontrollably". Other group clinician was pulled into virtual session to meet with patient and debrief/safety plan.   P: Continue to attend PHP OT group sessions 5x week for 2 weeks to promote daily structure, social engagement, and opportunities to develop and utilize adaptive strategies to maximize functional performance in preparation for safe transition and integration back into school, work, and the community. Plan to address topic of wellness in next OT group session.   Plan - 12/24/20 1424    Occupational performance deficits (Please refer to evaluation for details): ADL's;IADL's;Rest and Sleep;Education;Work;Leisure;Social Participation    Body Structure / Function / Physical Skills ADL    Cognitive Skills Attention;Emotional;Energy/Drive;Learn;Memory;Perception;Problem Solve;Safety  Awareness;Temperament/Personality;Thought;Understand    Psychosocial Skills Coping Strategies;Environmental  Adaptations;Habits;Interpersonal Interaction;Routines and Behaviors           Patient will benefit from skilled therapeutic intervention in order to improve the following deficits and impairments:   Body Structure / Function / Physical Skills: ADL Cognitive Skills: Attention,Emotional,Energy/Drive,Learn,Memory,Perception,Problem Solve,Safety Awareness,Temperament/Personality,Thought,Understand Psychosocial Skills: Coping Strategies,Environmental  Adaptations,Habits,Interpersonal Interaction,Routines and Behaviors   Visit Diagnosis: Difficulty coping  Frontal lobe and executive function deficit  PTSD (post-traumatic stress disorder)    Problem List Patient Active Problem List   Diagnosis Date Noted  . PTSD (post-traumatic stress disorder) 07/21/2018  . GAD (generalized anxiety disorder) 07/21/2018  . Insomnia 07/21/2018  . MDD (major depressive disorder) 07/21/2018  . Nightmares 07/21/2018  . Insulin resistance 10/21/2017  . Other hyperlipidemia 10/07/2017  . Vitamin D deficiency  10/07/2017  . Class 3 obesity with serious comorbidity and body mass index (BMI) of 40.0 to 44.9 in adult 04/27/2017  . Residual foreign body in soft tissue 11/12/2016  . Prediabetes 11/02/2016  . Morbid obesity ([place]) 10/15/2016    12/24/2020  Ponciano Ort, MOT, OTR/L  12/24/2020, 2:24 PM  [facility] PARTIAL HOSPITALIZATION PROGRAM [place], [place], [address] Phone: [phone]   Fax:  [phone]  Name: Caroline Gonzales MRN: [id number] Date of Birth: 1976/07/19

## 2020-12-25 ENCOUNTER — Other Ambulatory Visit (HOSPITAL_COMMUNITY): Payer: 59

## 2020-12-25 ENCOUNTER — Other Ambulatory Visit: Payer: Self-pay

## 2020-12-25 ENCOUNTER — Ambulatory Visit (HOSPITAL_COMMUNITY): Payer: 59

## 2020-12-26 ENCOUNTER — Other Ambulatory Visit (HOSPITAL_COMMUNITY): Payer: 59 | Admitting: Licensed Clinical Social Worker

## 2020-12-26 ENCOUNTER — Encounter (HOSPITAL_COMMUNITY): Payer: Self-pay

## 2020-12-26 ENCOUNTER — Other Ambulatory Visit: Payer: Self-pay

## 2020-12-26 ENCOUNTER — Ambulatory Visit (HOSPITAL_COMMUNITY): Payer: 59

## 2020-12-26 ENCOUNTER — Other Ambulatory Visit (HOSPITAL_COMMUNITY): Payer: 59 | Admitting: Occupational Therapy

## 2020-12-26 DIAGNOSIS — Z880 Allergy status to penicillin: Secondary | ICD-10-CM | POA: Diagnosis not present

## 2020-12-26 DIAGNOSIS — Z881 Allergy status to other antibiotic agents status: Secondary | ICD-10-CM | POA: Diagnosis not present

## 2020-12-26 DIAGNOSIS — F519 Sleep disorder not due to a substance or known physiological condition, unspecified: Secondary | ICD-10-CM | POA: Diagnosis not present

## 2020-12-26 DIAGNOSIS — R41844 Frontal lobe and executive function deficit: Secondary | ICD-10-CM

## 2020-12-26 DIAGNOSIS — F4310 Post-traumatic stress disorder, unspecified: Secondary | ICD-10-CM

## 2020-12-26 DIAGNOSIS — F332 Major depressive disorder, recurrent severe without psychotic features: Secondary | ICD-10-CM | POA: Diagnosis not present

## 2020-12-26 DIAGNOSIS — Z79899 Other long term (current) drug therapy: Secondary | ICD-10-CM | POA: Diagnosis not present

## 2020-12-26 DIAGNOSIS — G4700 Insomnia, unspecified: Secondary | ICD-10-CM

## 2020-12-26 DIAGNOSIS — R4589 Other symptoms and signs involving emotional state: Secondary | ICD-10-CM

## 2020-12-26 DIAGNOSIS — F411 Generalized anxiety disorder: Secondary | ICD-10-CM

## 2020-12-26 DIAGNOSIS — F322 Major depressive disorder, single episode, severe without psychotic features: Secondary | ICD-10-CM

## 2020-12-26 DIAGNOSIS — Z87891 Personal history of nicotine dependence: Secondary | ICD-10-CM | POA: Diagnosis not present

## 2020-12-26 NOTE — Therapy (Addendum)
Caroline Gonzales, [place], [address] Phone: [phone]   Fax:  [phone] Virtual Visit via Video Note  I connected with Caroline Gonzales on 12/26/20 at  11:00 AM EDT by a video enabled telemedicine application and verified that I am speaking with the correct person using two identifiers.  Location: Patient: Patient Home Provider: Clinic Office   I discussed the limitations of evaluation and management by telemedicine and the availability of in person appointments. The patient expressed understanding and agreed to proceed.   I discussed the assessment and treatment plan with the patient. The patient was provided an opportunity to ask questions and all were answered. The patient agreed with the plan and demonstrated an understanding of the instructions.   The patient was advised to call back or seek an in-person evaluation if the symptoms worsen or if the condition fails to improve as anticipated.  I provided 60 minutes of non-face-to-face time during this encounter.   Caroline Gonzales, OT   Occupational Therapy Treatment  Patient Details  Name: Caroline Gonzales MRN: [id number] Date of Birth: 1976/03/17 Referring Provider (OT): Ricky Ala   Encounter Date: 12/26/2020    Past Medical History:  Diagnosis Date  . Allergy    Zyrtec, Allegra.  . Anxiety    followed by Dr. Toy Cookey  . Back pain   . Bipolar 1 disorder ([place])   . Complication of anesthesia   . Depression   . Fibroid   . Gallbladder problem   . Gastroparesis 09/14/2012   gastric emptying study in 2014  . GERD (gastroesophageal reflux disease)   . Headache   . Pneumonia    2013ish  . PONV (postoperative nausea and vomiting)   . Pre-diabetes   . PTSD (post-traumatic stress disorder)   . Sleep apnea    do not use a cpap  . Tinnitus   . Vitamin D deficiency   . Wears glasses     Past Surgical History:  Procedure Laterality Date   . ANKLE ARTHROSCOPY Left 2011  . CESAREAN SECTION    . CHOLECYSTECTOMY    . DILATION AND CURETTAGE OF UTERUS    . FOREIGN BODY REMOVAL Left 11/18/2016   Procedure: REMOVAL FOREIGN BODY EXTREMITY LEFT FOOT;  Surgeon: Trula Slade, DPM;  Location: [facility];  Service: Podiatry;  Laterality: Left;  . PILONIDAL CYST EXCISION  1990's  . RADIOLOGY WITH ANESTHESIA N/A 11/10/2018   Procedure: MRI WITH ANESTHESIA OF BRAIN AND ORBITS WITH AND WITHOUT CONTRAST;  Surgeon: Radiologist, Medication, MD;  Location: [facility];  Service: Radiology;  Laterality: N/A;  . TENDON REPAIR Left 2011   Left Ankle  . WISDOM TOOTH EXTRACTION  [address]90's    There were no vitals filed for this visit.      OT Short Term Goals - 12/30/20 0845      OT SHORT TERM GOAL #1   Status Not Met      OT SHORT TERM GOAL #2   Status Not Met      OT SHORT TERM GOAL #3   Status Not Met         Group Session:  S: "I have binge eating disorder, so I don't know if this will be at all helpful, but I will just paint my nails or do something and get through it."  O: Today's group session focused on the topic of health and wellness as it relates to the impact on mental health.  Discussion focused on identifying the 5 F's to wellness including Food, Fitness, Fresh air, Fellowship, and Friendship with self and soul. Group members identified areas of wellness that they would like to improve upon and were educated and offered various resources. Discussion also focused on how the food we eat impacts our mental health, along with the benefits of engaging in physical activity/exercise and getting outside for fresh air. Discussion wrapped up with group members identifying one area of wellness they could improve upon and identified a strategy to do so.    A: Caroline Gonzales was active and independent in her participation of discussion and activity, despite sharing that she felt she might be triggered surrounding today's topic. She expressed concern over  having a binge eating disorder, however when reviewing this portion of topic, did not appear anxious or distressed and appeared to manage discussion effectively. She was actively engaged in all other portions, sharing that her areas of improvement when it came to wellness were fitness and friendship with herself. She shared that she likes to get her steps in by walking through the grocery store and lifting all the groceries into the house. She also identified getting into her Jeep with no windows or door in the spring time and go for a drive as a way to get fresh air. Pt engaged, active, and receptive to strategies and education provided durign session.   P: Continue to attend PHP OT group sessions 5x week for 2 weeks to promote daily structure, social engagement, and opportunities to develop and utilize adaptive strategies to maximize functional performance in preparation for safe transition and integration back into school, work, and the community.    OCCUPATIONAL THERAPY DISCHARGE SUMMARY  Visits from Start of Care: 6  Current functional level related to goals / functional outcomes: Pt has not met any of her OT goals within the [facility] program and has not returned to group/treatment since Thursday 12/26/2020. CSW made contact with patient and patient is requesting discharge at this time from [facility] and will follow up with daily therapy from her individual OP therapist. Pt will be discharged at this time at patient's request.   Remaining deficits: See above   Education / Equipment: See above   Plan: Patient agrees to discharge.  Patient goals were not met. Patient is being discharged due to the patient's request.  ?????         Patient will benefit from skilled therapeutic intervention in order to improve the following deficits and impairments:   Body Structure / Function / Physical Skills: ADL Cognitive Skills: Attention,Emotional,Energy/Drive,Learn,Memory,Perception,Problem Solve,Safety  Awareness,Temperament/Personality,Thought,Understand Psychosocial Skills: Coping Strategies,Environmental  Adaptations,Habits,Interpersonal Interaction,Routines and Behaviors   Visit Diagnosis: Difficulty coping  Frontal lobe and executive function deficit  PTSD (post-traumatic stress disorder)    Problem List Patient Active Problem List   Diagnosis Date Noted  . PTSD (post-traumatic stress disorder) 07/21/2018  . GAD (generalized anxiety disorder) 07/21/2018  . Insomnia 07/21/2018  . MDD (major depressive disorder) 07/21/2018  . Nightmares 07/21/2018  . Insulin resistance 10/21/2017  . Other hyperlipidemia 10/07/2017  . Vitamin D deficiency 10/07/2017  . Class 3 obesity with serious comorbidity and body mass index (BMI) of 40.0 to 44.9 in adult 04/27/2017  . Residual foreign body in soft tissue 11/12/2016  . Prediabetes 11/02/2016  . Morbid obesity ([place]) 10/15/2016    12/30/2020  Caroline Gonzales, MOT, OTR/L  12/30/2020, 8:45 AM  [facility]OSPITALIZ[facility] PROGRAM [place], [place], [address] Phone:  [phone]   Fax:  [phone]  Name: Caroline Gonzales MRN: [id number] Date of Birth: 1976-06-04

## 2020-12-27 ENCOUNTER — Encounter (HOSPITAL_COMMUNITY): Payer: Self-pay | Admitting: Family

## 2020-12-27 ENCOUNTER — Other Ambulatory Visit: Payer: Self-pay

## 2020-12-27 ENCOUNTER — Other Ambulatory Visit (HOSPITAL_COMMUNITY): Payer: 59

## 2020-12-27 ENCOUNTER — Ambulatory Visit (HOSPITAL_COMMUNITY): Payer: 59

## 2020-12-27 DIAGNOSIS — F4310 Post-traumatic stress disorder, unspecified: Secondary | ICD-10-CM | POA: Diagnosis not present

## 2020-12-27 NOTE — Progress Notes (Signed)
Virtual Visit via Video Note  I connected with Caroline Gonzales on 12/27/20 at  9:00 AM EDT by a video enabled telemedicine application and verified that I am speaking with the correct person using two identifiers.  Location: Patient: Office Provider: Office   I discussed the limitations of evaluation and management by telemedicine and the availability of in person appointments. The patient expressed understanding and agreed to proceed.   I discussed the assessment and treatment plan with the patient. The patient was provided an opportunity to ask questions and all were answered. The patient agreed with the plan and demonstrated an understanding of the instructions.   The patient was advised to call back or seek an in-person evaluation if the symptoms worsen or if the condition fails to improve as anticipated.  I provided 15 minutes of non-face-to-face time during this encounter.   Derrill Center, NP   [facility] MD/PA/NP OP Progress Note  12/27/2020 10:42 AM ADELAIDA REINDEL  MRN:  [id number]  Evaluation: Caroline Gonzales was seen and evaluated via WebEx.  She reports " had a rough weekend" states struggling with depression and panic attacks throughout the weekend. Markasia rates her depression 7 out of 10 with 10 being the worst.  Denying suicidal or homicidal ideation during this assessment.  Denies auditory or visual hallucination.  Cozette is currently prescribed Luvox, gabapentin, Klonopin and Seroquel.  She reports taking and tolerating medications well.  Reports multiple medication adjustments in the past. " Just cannot shake this depression" Melanine remains focused with follow-up for [place].  NP has spoke to attending psychiatrist Dwyane Dee for scheduling after completion of partial hospitalization programming.  Melaine reports a good appetite.  States she is resting " okay"  throughout the night.  " my sleep is not restful."  Discussed relaxation techniques prior to going to bed.  Patient  was receptive. Support ,encouragement and  reassurance was provided.  Visit Diagnosis:    ICD-10-CM   1. MDD (major depressive disorder), severe ([place])  F32.2   2. Generalized anxiety disorder  F41.1   3. Insomnia, unspecified type  G47.00     Past Psychiatric History:   Past Medical History:  Past Medical History:  Diagnosis Date  . Allergy    Zyrtec, Allegra.  . Anxiety    followed by Dr. Toy Cookey  . Back pain   . Bipolar 1 disorder ([place])   . Complication of anesthesia   . Depression   . Fibroid   . Gallbladder problem   . Gastroparesis 09/14/2012   gastric emptying study in 2014  . GERD (gastroesophageal reflux disease)   . Headache   . Pneumonia    2013ish  . PONV (postoperative nausea and vomiting)   . Pre-diabetes   . PTSD (post-traumatic stress disorder)   . Sleep apnea    do not use a cpap  . Tinnitus   . Vitamin D deficiency   . Wears glasses     Past Surgical History:  Procedure Laterality Date  . ANKLE ARTHROSCOPY Left 2011  . CESAREAN SECTION    . CHOLECYSTECTOMY    . DILATION AND CURETTAGE OF UTERUS    . FOREIGN BODY REMOVAL Left 11/18/2016   Procedure: REMOVAL FOREIGN BODY EXTREMITY LEFT FOOT;  Surgeon: Trula Slade, DPM;  Location: [facility];  Service: Podiatry;  Laterality: Left;  . PILONIDAL CYST EXCISION  1990's  . RADIOLOGY WITH ANESTHESIA N/A 11/10/2018   Procedure: MRI WITH ANESTHESIA OF BRAIN AND ORBITS WITH AND WITHOUT CONTRAST;  Surgeon: Radiologist, Medication, MD;  Location: [facility];  Service: Radiology;  Laterality: N/A;  . TENDON REPAIR Left 2011   Left Ankle  . WISDOM TOOTH EXTRACTION  19090's    Family Psychiatric History:   Family History:  Family History  Problem Relation Age of Onset  . Cancer Mother        squamous cell carcinoma  . Hyperlipidemia Mother   . Depression Mother   . Anxiety disorder Mother   . Obesity Mother   . Heart disease Father 63       cardiomegaly, CHF; steroid use.  [name] Hyperlipidemia Father   .  Hypertension Father   . Mental retardation Father   . High blood pressure Father   . Depression Father   . Anxiety disorder Father   . Obesity Father   . Cancer Maternal Grandmother   . Diabetes Maternal Grandmother   . Heart disease Maternal Grandmother   . Hyperlipidemia Maternal Grandmother   . Hypertension Maternal Grandmother   . Stroke Maternal Grandmother   . Heart disease Paternal Grandmother   . Hypertension Paternal Grandmother   . Heart disease Paternal Grandfather   . Hyperlipidemia Paternal Grandfather   . Mental illness Paternal Grandfather   . Rheum arthritis Sister   . Multiple sclerosis Sister   . Breast cancer Maternal Aunt   . Thyroid cancer Paternal Aunt     Social History:  Social History   Socioeconomic History  . Marital status: Married    Spouse name: Not on file  . Number of children: 2  . Years of education: Not on file  . Highest education level: Bachelor's degree (e.g., BA, AB, BS)  Occupational History  . Occupation: Programmer, multimedia:   Tobacco Use  . Smoking status: Former Smoker    Years: 10.00    Types: Cigarettes    Quit date: 2003    Years since quitting: 19.2  . Smokeless tobacco: Never Used  Vaping Use  . Vaping Use: Never used  Substance and Sexual Activity  . Alcohol use: Yes    Alcohol/week: 0.0 standard drinks    Comment: every few months  . Drug use: No  . Sexual activity: Yes    Partners: Male    Birth control/protection: I.U.D.    Comment: Mirena  Other Topics Concern  . 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      Seatbelt: 100%; no texting while driving often       Social  Determinants of Radio broadcast assistant Strain: Not on file  Food Insecurity: Not on file  Transportation Needs: Not on file  Physical Activity: Not on file  Stress: Not on file  Social Connections: Not on file    Allergies:  Allergies  Allergen Reactions  . Penicillins Shortness Of Breath and Rash    Did it involve swelling of the face/tongue/throat, SOB, or low BP? Yes Did it involve sudden or severe rash/hives, skin peeling, or any reaction on the inside of your mouth  or nose? Yes Did you need to seek medical attention at a hospital or doctor's office? Yes When did it last happen?childhood allergy If all above answers are "NO", may proceed with cephalosporin use.    . Tamiflu [Oseltamivir Phosphate] Anaphylaxis  . Tamiflu [Oseltamivir Phosphate] Anaphylaxis  . Cephalosporins Rash  . Latex Rash    Metabolic Disorder Labs: Lab Results  Component Value Date   HGBA1C 5.7 (H) 09/05/2019   MPG 114 12/01/2015   No results found for: PROLACTIN Lab Results  Component Value Date   CHOL 157 03/22/2019   TRIG 231 (H) 03/22/2019   HDL 41 03/22/2019   CHOLHDL 3.5 02/10/2018   VLDL 37 (H) 12/01/2015   LDLCALC 70 03/22/2019   [place]io 80 10/10/2018   Lab Results  Component Value Date   TSH 1.620 08/31/2016   TSH 1.83 12/01/2015    Therapeutic Level Labs: No results found for: LITHIUM No results found for: VALPROATE No components found for:  CBMZ  Current Medications: Current Outpatient Medications  Medication Sig Dispense Refill  . cetirizine (ZYRTEC) 10 MG tablet Take 10 mg by mouth daily.    . Cholecalciferol (VITAMIN D3) 5000 units CAPS Take 5,000 Units by mouth at bedtime.    . clonazePAM (KLONOPIN) 0.5 MG tablet TAKE 1 TABLET (0.5 MG TOTAL) BY MOUTH EVERY 6 (SIX) HOURS AS NEEDED FOR ANXIETY 120 tablet 5  . COVID-19 At Home Antigen Test KIT USE AS DIRECTED. 4 kit 0  . fluvoxaMINE (LUVOX) 100 MG tablet TAKE 1 TABLET (100 MG TOTAL) BY MOUTH AT BEDTIME. 90  tablet 1  . fluvoxaMINE (LUVOX) 50 MG tablet TAKE 1 TABLET (50 MG TOTAL) BY MOUTH AT BEDTIME. TAKE IN ADDITION TO THE 100 MG PILL TO EQUAL 150 MG 90 tablet 1  . gabapentin (NEURONTIN) 300 MG capsule TAKE 2 CAPSULES (600 MG TOTAL) BY MOUTH AT BEDTIME. 180 capsule 1  . ibuprofen (ADVIL,MOTRIN) 200 MG tablet Take 400 mg by mouth daily as needed for headache or moderate pain.    . metFORMIN (GLUCOPHAGE) 1000 MG tablet TAKE 1 TABLET (1,000 MG TOTAL) BY MOUTH DAILY WITH BREAKFAST. (Patient not taking: Reported on 12/23/2020) 30 tablet 5  . Multiple Vitamin (MULTIVITAMIN) tablet Take 1 tablet by mouth at bedtime.     [name] NEXIUM 20 MG packet Take by mouth at bedtime.    . Norethindrone-Ethinyl Estradiol-Fe Biphas (LO LOESTRIN FE) 1 MG-10 MCG / 10 MCG tablet TAKE 1 TABLET BY MOUTH DAILY. 28 tablet 0  . prazosin (MINIPRESS) 2 MG capsule Take 4 capsules (8 mg total) by mouth at bedtime. 360 capsule 1  . propranolol (INDERAL) 40 MG tablet TAKE 1 TABLET (40 MG TOTAL) BY MOUTH 2 (TWO) TIMES DAILY. 180 tablet 3  . QUEtiapine (SEROQUEL) 100 MG tablet TAKE 1 TABLET (100 MG TOTAL) BY MOUTH DAILY FOR 4 DAYS. (Patient taking differently: Take 400 mg by mouth daily.) 4 tablet 0  . QUEtiapine (SEROQUEL) 300 MG tablet TAKE 1 TABLET (300 MG TOTAL) BY MOUTH AT BEDTIME. 90 tablet 1  . tobramycin-dexamethasone (TOBRADEX) ophthalmic solution INSTILL 1 DROP INTO BOTH EYES FOUR TIMES A DAY FOR 1 WEEK, THEN DECREASE TO TWICE A DAY FOR 1 WEEK. (Patient not taking: Reported on 12/23/2020) 10 mL 0  . Topiramate ER (TROKENDI XR) 100 MG CP24 TAKE 1 CAPSULE BY MOUTH ONCE DAILY (Patient not taking: Reported on 12/23/2020) 30 capsule 5   No current facility-administered medications for this visit.     Musculoskeletal: Strength & Muscle Tone:  within normal limits Gait & Station: normal Patient leans: N/A  Psychiatric Specialty Exam: Review of Systems  There were no vitals taken for this visit.There is no height or weight on file to  calculate BMI.  General Appearance: Casual  Eye Contact:  Good  Speech:  Clear and Coherent  Volume:  Normal  Mood:  Anxious and Depressed  Affect:  Congruent  Thought Process:  Coherent  Orientation:  Full (Time, Place, and Person)  Thought Content: Logical   Suicidal Thoughts:  No  Homicidal Thoughts:  No  Memory:  Immediate;   Fair Recent;   Fair  Judgement:  Good  Insight:  Fair  Psychomotor Activity:  Normal  Concentration:  Concentration: Fair  Recall:  [employer] of Knowledge: Fair  Language: Good  Akathisia:  No  Handed:  Right  AIMS (if indicated):   Assets:  Communication Skills Resilience Social Support  ADL's:  Intact  Cognition: WNL  Sleep:  Fair   Screenings: GAD-7   Flowsheet Row Office Visit from 03/29/2018 in [facility]  Total GAD-7 Score 17    PHQ2-9   Flowsheet Row Counselor from 12/23/2020 in [facility] Counselor from 12/17/2020 in [facility] Office Visit from 03/29/2018 in [facility] Office Visit from 02/02/2018 in Primary Care at [facility] Visit from 04/14/2017 in Primary Care at [facility] Total Score '6 4 1 ' 0 0  PHQ-9 Total Score '26 21 10 ' -- --    Flowsheet Row Counselor from 12/23/2020 in [facility] Counselor from 12/17/2020 in [facility] ED from 12/06/2020 in [facility] Error: Q3, 4, or 5 should not be populated when Q2 is No No Risk No Risk       Assessment and Plan:  Patient to continue partial hospitalization program Continue medications as directed Keep follow-up appointment with [facility] assessment department  Treatment plan was reviewed and agreed upon by NP T. Rajanee Schuelke and patient Abagael Kramm need for continued group services    Derrill Center, NP 12/27/2020, 10:42 AM

## 2020-12-30 ENCOUNTER — Other Ambulatory Visit (HOSPITAL_COMMUNITY): Payer: 59

## 2020-12-30 ENCOUNTER — Telehealth: Payer: Self-pay | Admitting: Physician Assistant

## 2020-12-30 ENCOUNTER — Other Ambulatory Visit: Payer: Self-pay

## 2020-12-30 DIAGNOSIS — F4310 Post-traumatic stress disorder, unspecified: Secondary | ICD-10-CM | POA: Diagnosis not present

## 2020-12-30 DIAGNOSIS — Z0289 Encounter for other administrative examinations: Secondary | ICD-10-CM

## 2020-12-30 NOTE — Telephone Encounter (Signed)
Received Attending Physician's Statement form. Placed on Traci's desk 4/18
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
## 2020-12-31 ENCOUNTER — Ambulatory Visit (HOSPITAL_COMMUNITY): Payer: 59

## 2020-12-31 ENCOUNTER — Other Ambulatory Visit (HOSPITAL_COMMUNITY): Payer: 59

## 2020-12-31 DIAGNOSIS — F4310 Post-traumatic stress disorder, unspecified: Secondary | ICD-10-CM | POA: Diagnosis not present

## 2020-12-31 NOTE — Telephone Encounter (Signed)
Will close this note. If/when needs to change/add to [facility], will do it then.

## 2021-01-01 ENCOUNTER — Encounter: Payer: Self-pay | Admitting: Obstetrics and Gynecology

## 2021-01-01 ENCOUNTER — Other Ambulatory Visit: Payer: Self-pay

## 2021-01-01 ENCOUNTER — Other Ambulatory Visit (HOSPITAL_COMMUNITY): Payer: Self-pay

## 2021-01-01 ENCOUNTER — Other Ambulatory Visit (HOSPITAL_COMMUNITY)
Admission: RE | Admit: 2021-01-01 | Discharge: 2021-01-01 | Disposition: A | Discharge status: Home / Self Care | Payer: 59 | Source: Ambulatory Visit | Attending: Obstetrics and Gynecology | Admitting: Obstetrics and Gynecology

## 2021-01-01 ENCOUNTER — Other Ambulatory Visit: Payer: Self-pay | Admitting: Obstetrics and Gynecology

## 2021-01-01 ENCOUNTER — Ambulatory Visit (INDEPENDENT_AMBULATORY_CARE_PROVIDER_SITE_OTHER): Payer: 59 | Admitting: Obstetrics and Gynecology

## 2021-01-01 ENCOUNTER — Ambulatory Visit (HOSPITAL_COMMUNITY): Payer: 59

## 2021-01-01 VITALS — BP 136/82 | HR 111 | Ht 65.0 in | Wt 287.0 lb

## 2021-01-01 DIAGNOSIS — Z01419 Encounter for gynecological examination (general) (routine) without abnormal findings: Secondary | ICD-10-CM | POA: Diagnosis not present

## 2021-01-01 DIAGNOSIS — Z3009 Encounter for other general counseling and advice on contraception: Secondary | ICD-10-CM | POA: Diagnosis not present

## 2021-01-01 DIAGNOSIS — Z124 Encounter for screening for malignant neoplasm of cervix: Secondary | ICD-10-CM | POA: Insufficient documentation

## 2021-01-01 DIAGNOSIS — Z1211 Encounter for screening for malignant neoplasm of colon: Secondary | ICD-10-CM

## 2021-01-01 DIAGNOSIS — Z62810 Personal history of physical and sexual abuse in childhood: Secondary | ICD-10-CM | POA: Diagnosis not present

## 2021-01-01 DIAGNOSIS — Z3041 Encounter for surveillance of contraceptive pills: Secondary | ICD-10-CM

## 2021-01-01 DIAGNOSIS — Z6841 Body Mass Index (BMI) 40.0 and over, adult: Secondary | ICD-10-CM | POA: Diagnosis not present

## 2021-01-01 DIAGNOSIS — Z1231 Encounter for screening mammogram for malignant neoplasm of breast: Secondary | ICD-10-CM

## 2021-01-01 DIAGNOSIS — F4310 Post-traumatic stress disorder, unspecified: Secondary | ICD-10-CM | POA: Diagnosis not present

## 2021-01-01 DIAGNOSIS — T7422XA Child sexual abuse, confirmed, initial encounter: Secondary | ICD-10-CM

## 2021-01-01 MED ORDER — NORETHIN-ETH ESTRAD-FE BIPHAS 1 MG-10 MCG / 10 MCG PO TABS
1.0000 | ORAL_TABLET | Freq: Every day | ORAL | 3 refills | Status: DC
  Filled 2021-01-01: qty 28, 28d supply, fill #0

## 2021-01-01 NOTE — Patient Instructions (Signed)
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
## 2021-01-02 ENCOUNTER — Ambulatory Visit (HOSPITAL_COMMUNITY): Payer: 59

## 2021-01-02 ENCOUNTER — Telehealth: Payer: Self-pay | Admitting: Physician Assistant

## 2021-01-02 ENCOUNTER — Other Ambulatory Visit (HOSPITAL_COMMUNITY): Payer: 59

## 2021-01-02 DIAGNOSIS — F4310 Post-traumatic stress disorder, unspecified: Secondary | ICD-10-CM | POA: Diagnosis not present

## 2021-01-02 NOTE — Telephone Encounter (Signed)
Please review

## 2021-01-02 NOTE — Telephone Encounter (Signed)
In addition to that I received paperwork from [facility], [place] Attending Physician Progress Report but I don't believe she has been seen again since her leave started. She does have upcoming apt next week. They are requesting her back to work date and want it returned by today. I will not be able to complete, as I just received yesterday.

## 2021-01-02 NOTE — Telephone Encounter (Signed)
I won't know about RTW date until I see her. Let's tentatively say 01/13/2021 if you have to something. But that could change.

## 2021-01-02 NOTE — Telephone Encounter (Signed)
Next visit is 01/09/21. [name] is having a hysterectomy done by Dr. Sumner Boast and she suggested Caroline Gonzales get a letter stating that due to her PTSD, a hysterectomy could improve her mental health. Please fax to [phone].

## 2021-01-02 NOTE — Telephone Encounter (Signed)
I have sent an email to Dr. Talbert Nan, to clarify the patient's message about a hysterectomy.  We will wait to hear back from her.

## 2021-01-03 ENCOUNTER — Ambulatory Visit (HOSPITAL_COMMUNITY): Payer: 59

## 2021-01-03 ENCOUNTER — Other Ambulatory Visit (HOSPITAL_COMMUNITY): Payer: 59

## 2021-01-03 ENCOUNTER — Telehealth: Payer: Self-pay

## 2021-01-03 DIAGNOSIS — F4310 Post-traumatic stress disorder, unspecified: Secondary | ICD-10-CM | POA: Diagnosis not present

## 2021-01-03 LAB — CYTOLOGY - PAP
Adequacy: ABSENT
Comment: NEGATIVE
Diagnosis: NEGATIVE
High risk HPV: NEGATIVE

## 2021-01-03 NOTE — Telephone Encounter (Signed)
Noted  

## 2021-01-03 NOTE — Telephone Encounter (Signed)
I contacted [facility] to let them know pt has apt this week and if we wait till after her visit they will have more information to go on. The representative put a note in that it would be received after apt.

## 2021-01-03 NOTE — Telephone Encounter (Signed)
Spoke with patient regarding surgery benefits. Patient acknowledges understanding of information presented. Patient is aware that benefits presented are professional benefits only. Patient is aware that once surgery is scheduled, the hospital will call with separate benefits. See account note.  Routing to Glorianne Manchester, Therapist, sports, for surgery scheduling.  Patient has contacted her psychiatrist regarding notation of recommendations and stated that it will be coming via fax.
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
## 2021-01-03 NOTE — Telephone Encounter (Signed)
Call to patient. Per DPR, OK to leave message on voicemail.   Left voicemail requesting a return call to Gyan Cambre to review benefits for recommended surgery with Jill Jertson, MD. 

## 2021-01-05 MED FILL — Propranolol HCl Tab 40 MG: ORAL | 90 days supply | Qty: 180 | Fill #0 | Status: AC

## 2021-01-05 MED FILL — Clonazepam Tab 0.5 MG: ORAL | 30 days supply | Qty: 120 | Fill #0 | Status: AC

## 2021-01-06 ENCOUNTER — Other Ambulatory Visit: Payer: Self-pay

## 2021-01-06 ENCOUNTER — Ambulatory Visit
Admission: RE | Admit: 2021-01-06 | Discharge: 2021-01-06 | Disposition: A | Discharge status: Home / Self Care | Payer: 59 | Source: Ambulatory Visit | Attending: Obstetrics and Gynecology | Admitting: Obstetrics and Gynecology

## 2021-01-06 ENCOUNTER — Other Ambulatory Visit (HOSPITAL_COMMUNITY): Payer: Self-pay

## 2021-01-06 DIAGNOSIS — Z1231 Encounter for screening mammogram for malignant neoplasm of breast: Secondary | ICD-10-CM | POA: Diagnosis not present

## 2021-01-06 DIAGNOSIS — F4310 Post-traumatic stress disorder, unspecified: Secondary | ICD-10-CM | POA: Diagnosis not present

## 2021-01-08 DIAGNOSIS — F4310 Post-traumatic stress disorder, unspecified: Secondary | ICD-10-CM | POA: Diagnosis not present

## 2021-01-09 ENCOUNTER — Other Ambulatory Visit (HOSPITAL_COMMUNITY): Payer: Self-pay

## 2021-01-09 ENCOUNTER — Encounter: Payer: Self-pay | Admitting: Physician Assistant

## 2021-01-09 ENCOUNTER — Other Ambulatory Visit: Payer: Self-pay

## 2021-01-09 ENCOUNTER — Ambulatory Visit (INDEPENDENT_AMBULATORY_CARE_PROVIDER_SITE_OTHER): Payer: 59 | Admitting: Physician Assistant

## 2021-01-09 DIAGNOSIS — F411 Generalized anxiety disorder: Secondary | ICD-10-CM

## 2021-01-09 DIAGNOSIS — G4700 Insomnia, unspecified: Secondary | ICD-10-CM

## 2021-01-09 DIAGNOSIS — F515 Nightmare disorder: Secondary | ICD-10-CM

## 2021-01-09 DIAGNOSIS — F332 Major depressive disorder, recurrent severe without psychotic features: Secondary | ICD-10-CM

## 2021-01-09 DIAGNOSIS — F4310 Post-traumatic stress disorder, unspecified: Secondary | ICD-10-CM | POA: Diagnosis not present

## 2021-01-09 MED ORDER — QUETIAPINE FUMARATE 200 MG PO TABS
100.0000 mg | ORAL_TABLET | Freq: Every day | ORAL | 1 refills | Status: DC
  Filled 2021-01-09 – 2021-01-28 (×2): qty 90, 90d supply, fill #0
  Filled 2021-04-28: qty 90, 90d supply, fill #1
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
## 2021-01-13 ENCOUNTER — Telehealth: Payer: Self-pay | Admitting: Obstetrics and Gynecology

## 2021-01-13 NOTE — Telephone Encounter (Signed)
See telephone encounter dated 01/13/21.   Encounter closed.

## 2021-01-13 NOTE — Telephone Encounter (Signed)
Spoke with patient, reviewed surgery dates and Covid quarantine requirements. Patient request to proceed with surgery at [facility] on 02/04/21.  Advised patient I will return call once surgery date confirmed.  Patient agreeable.   Surgery request sent  Routing to [facility]
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
## 2021-01-14 ENCOUNTER — Other Ambulatory Visit: Payer: Self-pay

## 2021-01-14 ENCOUNTER — Ambulatory Visit (INDEPENDENT_AMBULATORY_CARE_PROVIDER_SITE_OTHER): Payer: 59 | Admitting: Psychiatry

## 2021-01-14 DIAGNOSIS — F411 Generalized anxiety disorder: Secondary | ICD-10-CM

## 2021-01-14 DIAGNOSIS — F4310 Post-traumatic stress disorder, unspecified: Secondary | ICD-10-CM | POA: Diagnosis not present

## 2021-01-14 DIAGNOSIS — F332 Major depressive disorder, recurrent severe without psychotic features: Secondary | ICD-10-CM
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
## 2021-01-14 NOTE — Telephone Encounter (Signed)
BMI 47 with Hx of sleep apnea.  Reviewed with PAT nurse at [facility], was advised patient will have anesthesia airway consult to be scheduled with PST lab appt.    Spoke with patient. Surgery date request confirmed.  Advised surgery is scheduled for 02/04/21, [place], 0730. Surgery instruction sheet and hospital brochure reviewed, printed copy will be mailed. Patient advised of Covid screening and quarantine requirements and agreeable.  Pre-op scheduled for 01/20/21.   Routing to provider. Encounter closed.   Cc: Hayley Carder

## 2021-01-15 DIAGNOSIS — Z0289 Encounter for other administrative examinations: Secondary | ICD-10-CM
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
## 2021-01-15 NOTE — Psych (Signed)
Virtual Visit via Video Note  I connected with Caroline Gonzales on 12/19/20 at  9:00 AM EDT by a video enabled telemedicine application and verified that I am speaking with the correct person using two identifiers.  Location: Patient: patient home Provider: clinical home office   I discussed the limitations of evaluation and management by telemedicine and the availability of in person appointments. The patient expressed understanding and agreed to proceed.  I discussed the assessment and treatment plan with the patient. The patient was provided an opportunity to ask questions and all were answered. The patient agreed with the plan and demonstrated an understanding of the instructions.   The patient was advised to call back or seek an in-person evaluation if the symptoms worsen or if the condition fails to improve as anticipated.  Cln and pt completed treatment plan and pt stated verbal alignment with plan. Pt gives verbal consent to treatment and group commitments.   I provided 10 minutes of non-face-to-face time during this encounter.   Lorin Glass, LCSW

## 2021-01-16 NOTE — Psych (Signed)
Virtual Visit via Video Note  I connected with Caroline Gonzales on 12/20/20 at  9:00 AM EDT by a video enabled telemedicine application and verified that I am speaking with the correct person using two identifiers.  Location: Patient: patient home Provider: clinical home office   I discussed the limitations of evaluation and management by telemedicine and the availability of in person appointments. The patient expressed understanding and agreed to proceed.  I discussed the assessment and treatment plan with the patient. The patient was provided an opportunity to ask questions and all were answered. The patient agreed with the plan and demonstrated an understanding of the instructions.   The patient was advised to call back or seek an in-person evaluation if the symptoms worsen or if the condition fails to improve as anticipated.  Pt was provided 240 minutes of non-face-to-face time during this encounter.   Lorin Glass, LCSW    [facility] PHP THERAPIST PROGRESS NOTE  Caroline Gonzales [id number]  Session Time: 9:00 - 10:00  Participation Level: Active  Behavioral Response: CasualAlertDepressed  Type of Therapy: Group Therapy  Treatment Goals addressed: Coping  Interventions: CBT, DBT, Supportive and Reframing  Summary: Clinician led check-in regarding current stressors and situation.Clinician utilized active listening and empathetic response and validated patient emotions. Clinician facilitated processing group on pertinent issues.   Therapist Response: Caroline Gonzales is a 45 y.o. female who presents with depression and trauma symptoms.  Patient arrived within time allowed and reports that she is feeling "okay." Patient rates hermood at [facility] a scale of 1-10 with 10 being great. Pt reports yesterday was busy doing "mom things." Pt states feeling exhausted by the effort. Pt states she slept well hour wise, however felt unrested when she woke up. Pt reports continued high  triggers from her menstrual period.  Pt able to process. Pt engaged in discussion.      Session Time: 10:00 - 11:00   Participation Level:Active  Behavioral Response:CasualAlertDepressed  Type of Therapy:GroupTherapy  Treatment Goals addressed: Coping  Interventions:CBT, DBT, Supportive and Reframing  Summary:Cln led discussion on family dynamics and the way in which they impact [name]. Group members shared struggles with their families and the way the patterns of behaviors have negatively impacted them. Cln provided space to process and validated pt's experiences.   Therapist Response:Pt engaged in discussion and shares feeling unsupported by much of her family. Pt able to process      Session Time: 11:00- 12:00  Participation Level:Active  Behavioral Response:CasualAlertDepressed  Type of Therapy: Group Therapy, OT  Treatment Goals addressed: Coping  Interventions:Psychosocial skills training, Supportive  Summary:Occupational Therapy group  Therapist Response:Patient engaged in group. See OT note.      Session Time: 12:00 -1:00  Participation Level:Active  Behavioral Response:CasualAlertDepressed  Type of Therapy:Group therapy  Treatment Goals addressed: Coping  Interventions:CBT; Solution focused; Supportive; Reframing  Summary:12:00 - 12:50: Cln led discussion on ways to manage stressors and feelings over the weekend. Group members  brainstormed things to do over the weekend for multiple levels of energy, access, and moods. Cln reviewed crisis services should they be needed and provided pt's with the text crisis line, mobile crisis, national suicide hotline, [facility] 24/7 line, and information on [facility] Urgent Care.   12:50 -1:00 Clinician led check-out. Clinician assessed for immediate needs, medication compliance and efficacy, and safety concerns  Therapist Response:12:50 - 1:00: Pt engaged in discussion and is able  to identify 3 ideas of what to do over the weekend to  keep their mind engaged.  12:50 - 1:00: At check-out, patientrates hermood at [facility] a scale of 1-10 with 10 being great.Pt reports afternoon plans of preparing for seeing a friend. Pt demonstrates some progress as evidenced by attempting skills when upset. Patient denies SI/HI at the end of group.    Suicidal/Homicidal: Nowithout intent/plan  Plan: Pt will continue in PHP while working to decrease depression and trauma symptoms, increase ability to manage symptoms in a healthy manner, and increase emotional regulation.   Diagnosis: MDD (major depressive disorder), severe ([place]) [F32.2]    1. MDD (major depressive disorder), severe ([place])   2. PTSD (post-traumatic stress disorder)       Lorin Glass, LCSW 01/16/2021

## 2021-01-17 DIAGNOSIS — F4310 Post-traumatic stress disorder, unspecified: Secondary | ICD-10-CM | POA: Diagnosis not present

## 2021-01-17 NOTE — Telephone Encounter (Signed)
Received STD form placed on Traci's desk 01/17/21

## 2021-01-20 ENCOUNTER — Other Ambulatory Visit (HOSPITAL_COMMUNITY): Payer: Self-pay

## 2021-01-20 ENCOUNTER — Encounter: Payer: Self-pay | Admitting: Obstetrics and Gynecology

## 2021-01-20 ENCOUNTER — Other Ambulatory Visit: Payer: Self-pay

## 2021-01-20 ENCOUNTER — Ambulatory Visit: Payer: 59 | Admitting: Obstetrics and Gynecology

## 2021-01-20 VITALS — BP 122/74 | HR 99 | Ht 65.0 in | Wt 294.0 lb

## 2021-01-20 DIAGNOSIS — Z87898 Personal history of other specified conditions: Secondary | ICD-10-CM | POA: Diagnosis not present

## 2021-01-20 DIAGNOSIS — R5383 Other fatigue: Secondary | ICD-10-CM

## 2021-01-20 DIAGNOSIS — Z6841 Body Mass Index (BMI) 40.0 and over, adult: Secondary | ICD-10-CM | POA: Diagnosis not present

## 2021-01-20 DIAGNOSIS — Z62810 Personal history of physical and sexual abuse in childhood: Secondary | ICD-10-CM

## 2021-01-20 DIAGNOSIS — F4310 Post-traumatic stress disorder, unspecified: Secondary | ICD-10-CM

## 2021-01-20 DIAGNOSIS — Z01818 Encounter for other preprocedural examination: Secondary | ICD-10-CM | POA: Diagnosis not present
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
## 2021-01-21 LAB — COMPREHENSIVE METABOLIC PANEL
AG Ratio: 1 (calc) (ref 1.0–2.5)
ALT: 14 U/L (ref 6–29)
AST: 15 U/L (ref 10–35)
Albumin: 3.6 g/dL (ref 3.6–5.1)
Alkaline phosphatase (APISO): 73 U/L (ref 31–125)
BUN: 13 mg/dL (ref 7–25)
CO2: 26 mmol/L (ref 20–32)
Calcium: 9.1 mg/dL (ref 8.6–10.2)
Chloride: 104 mmol/L (ref 98–110)
Creat: 0.92 mg/dL (ref 0.50–1.10)
Globulin: 3.5 g/dL (calc) (ref 1.9–3.7)
Glucose, Bld: 112 mg/dL — ABNORMAL HIGH (ref 65–99)
Potassium: 4.3 mmol/L (ref 3.5–5.3)
Sodium: 139 mmol/L (ref 135–146)
Total Bilirubin: 0.2 mg/dL (ref 0.2–1.2)
Total Protein: 7.1 g/dL (ref 6.1–8.1)

## 2021-01-21 LAB — LIPID PANEL
Cholesterol: 154 mg/dL (ref <200)
HDL: 44 mg/dL — ABNORMAL LOW (ref >=50)
LDL Cholesterol (Calc): 77 mg/dL (calc)
Non-HDL Cholesterol (Calc): 110 mg/dL (calc) (ref <130)
Total CHOL/HDL Ratio: 3.5 (calc) (ref <5.0)
Triglycerides: 238 mg/dL — ABNORMAL HIGH (ref <150)

## 2021-01-21 LAB — TSH: TSH: 1.7 mIU/L

## 2021-01-21 LAB — CBC
HCT: 35.3 % (ref 35.0–45.0)
Hemoglobin: 11.5 g/dL — ABNORMAL LOW (ref 11.7–15.5)
MCH: 27.5 pg (ref 27.0–33.0)
MCHC: 32.6 g/dL (ref 32.0–36.0)
MCV: 84.4 fL (ref 80.0–100.0)
MPV: 11.5 fL (ref 7.5–12.5)
Platelets: 261 10*3/uL (ref 140–400)
RBC: 4.18 10*6/uL (ref 3.80–5.10)
RDW: 14.3 % (ref 11.0–15.0)
WBC: 10 10*3/uL (ref 3.8–10.8)

## 2021-01-21 LAB — HEMOGLOBIN A1C
Hgb A1c MFr Bld: 6 % of total Hgb — ABNORMAL HIGH (ref <5.7)
Mean Plasma Glucose: 126 mg/dL
eAG (mmol/L): 7 mmol/L

## 2021-01-22 ENCOUNTER — Telehealth: Payer: Self-pay | Admitting: *Deleted

## 2021-01-22 DIAGNOSIS — F4310 Post-traumatic stress disorder, unspecified: Secondary | ICD-10-CM | POA: Diagnosis not present

## 2021-01-22 NOTE — Telephone Encounter (Addendum)
FMLA form filled out and scanned into system copy mailed to patient. Left patient a message to call me back pertaining to FMLA form.  Pt aware forms will be mailed to her

## 2021-01-24 NOTE — Telephone Encounter (Signed)
F/u questions received from [facility] as well as requesting office notes. Helene Kelp out of office until 5/16 will discuss f/up questions then.

## 2021-01-25 NOTE — Psych (Signed)
Virtual Visit via Video Note  I connected with Caroline Gonzales on 12/23/20 at  9:00 AM EDT by a video enabled telemedicine application and verified that I am speaking with the correct person using two identifiers.  Location: Patient: patient home Provider: clinical home office   I discussed the limitations of evaluation and management by telemedicine and the availability of in person appointments. The patient expressed understanding and agreed to proceed.  I discussed the assessment and treatment plan with the patient. The patient was provided an opportunity to ask questions and all were answered. The patient agreed with the plan and demonstrated an understanding of the instructions.   The patient was advised to call back or seek an in-person evaluation if the symptoms worsen or if the condition fails to improve as anticipated.  Pt was provided 240 minutes of non-face-to-face time during this encounter.   Lorin Glass, LCSW    [facility] PHP THERAPIST PROGRESS NOTE  Caroline Gonzales [id number]  Session Time: 9:00 - 10:00  Participation Level: Active  Behavioral Response: CasualAlertDepressed  Type of Therapy: Group Therapy  Treatment Goals addressed: Coping  Interventions: CBT, DBT, Supportive and Reframing  Summary: Clinician led check-in regarding current stressors and situation.Clinician utilized active listening and empathetic response and validated patient emotions. Clinician facilitated processing group on pertinent issues.   Therapist Response: Caroline Gonzales is a 45 y.o. female who presents with depression and trauma symptoms.  Patient arrived within time allowed and reports that she is feeling "angry." Patient rates hermood at a3on a scale of 1-10 with 10 being great. Pt reports struggling with nightmares and poor sleep, self-judgment for being in treatment, and feeling overwhelmed. Pt states the weekend was rough for her. Pt able to process. Pt engaged in  discussion.      Session Time: 10:00 - 11:00   Participation Level:Active  Behavioral Response:CasualAlertDepressed  Type of Therapy:GroupTherapy  Treatment Goals addressed: Coping  Interventions:CBT, DBT, Supportive and Reframing  Summary:Cln led processing group for pt's current struggles. Group members shared stressors and provided support and feedback. Cln brought in topics of boundaries, healthy relationships, and unhealthy thought processes to inform discussion.   Therapist Response:Pt able to process and provide support to group.     Session Time: 11:00- 12:00  Participation Level:Active  Behavioral Response:CasualAlertDepressed  Type of Therapy: Group Therapy, OT  Treatment Goals addressed: Coping  Interventions:Psychosocial skills training, Supportive  Summary:Occupational Therapy group  Therapist Response:Patient engaged in group. See OT note.      Session Time: 12:00 -1:00  Participation Level:Active  Behavioral Response:CasualAlertDepressed  Type of Therapy:Group therapy  Treatment Goals addressed: Coping  Interventions:CBT; Solution focused; Supportive; Reframing  Summary:12:00 - 12:50: Cln led discussion on rest. Cln discussed the need to rewrite social story of rest being earned or last on the list and assert that rest is productive. Group members discussed barriers to allowing themselves rest and the negative self-talk involved.  Cln worked with group to thought challenge and apply self-coaching strategies. 12:50 -1:00 Clinician led check-out. Clinician assessed for immediate needs, medication compliance and efficacy, and safety concerns  Therapist Response:12:50 - 1:00: Pt engaged in discussion and reports struggle with allowing themselves rest.  12:50 - 1:00: At check-out, patientrates hermood at a6.5on a scale of 1-10 with 10 being great.Pt reports afternoon plans of napping and  cleaning. Pt demonstrates some progress as evidenced by openness. Patient denies SI/HI at the end of group.    Suicidal/Homicidal: Nowithout intent/plan  Plan: Pt will  continue in PHP while working to decrease depression and trauma symptoms, increase ability to manage symptoms in a healthy manner, and increase emotional regulation.   Diagnosis: PTSD (post-traumatic stress disorder) [F43.10]    1. PTSD (post-traumatic stress disorder)   2. Severe recurrent major depression without psychotic features [facility])       Lorin Glass, LCSW 01/25/2021

## 2021-01-26 NOTE — Psych (Signed)
Virtual Visit via Video Note  I connected with Sevanna Ballengee Wiegand on 12/24/20 at  9:00 AM EDT by a video enabled telemedicine application and verified that I am speaking with the correct person using two identifiers.  Location: Patient: patient home Provider: clinical home office   I discussed the limitations of evaluation and management by telemedicine and the availability of in person appointments. The patient expressed understanding and agreed to proceed.  I discussed the assessment and treatment plan with the patient. The patient was provided an opportunity to ask questions and all were answered. The patient agreed with the plan and demonstrated an understanding of the instructions.   The patient was advised to call back or seek an in-person evaluation if the symptoms worsen or if the condition fails to improve as anticipated.  Pt was provided 240 minutes of non-face-to-face time during this encounter.   Lorin Glass, LCSW    [facility] PHP THERAPIST PROGRESS NOTE  KOSISOCHUKWU GOLDBERG [id number]  Session Time: 9:00 - 10:00  Participation Level: Active  Behavioral Response: CasualAlertDepressed  Type of Therapy: Group Therapy  Treatment Goals addressed: Coping  Interventions: CBT, DBT, Supportive and Reframing  Summary: Clinician led check-in regarding current stressors and situation.Clinician utilized active listening and empathetic response and validated patient emotions. Clinician facilitated processing group on pertinent issues.   Therapist Response: CLEDITH KAMIYA is a 45 y.o. female who presents with depression and trauma symptoms.  Patient arrived within time allowed and reports that she is feeling "hopeful." Patient rates hermood at [facility] a scale of 1-10 with 10 being great. Pt reports friends are coming to visit and she is worried because the plans were made before she was struggling. Pt states delegating some of the household tasks yesterday and resting. Pt able  to process. Pt engaged in discussion.      Session Time: 10:00 - 11:00   Participation Level:Active  Behavioral Response:CasualAlertDepressed  Type of Therapy:GroupTherapy  Treatment Goals addressed: Coping  Interventions:CBT, DBT, Supportive and Reframing  Summary:Cln led discussion on ways to manage our feelings. Cln encouraged pt's to rank their feeling in intensity 1-10 with 10 being most intense. If feeling is ranked 5+, pt's main job is to manage the feeling via coping skill. If the feeling is ranked 4 or below, coping or problem solving can be attempted. Cln brought in CBT thought challenging and DBT distress tolerance skills to aid discussion. Group members discussed how to apply and utilize this information.    Therapist Response:Pt engaged in discussion and reports understanding and previous struggle with wanting to problem solve when emotionaly escalated.      Session Time: 11:00- 12:00  Participation Level:Active  Behavioral Response:CasualAlertDepressed  Type of Therapy: Group Therapy, OT  Treatment Goals addressed: Coping  Interventions:Psychosocial skills training, Supportive  Summary:Occupational Therapy group  Therapist Response:Patient engaged in group. See OT note.      Session Time: 12:00 -1:00  Participation Level:Active  Behavioral Response:CasualAlertDepressed  Type of Therapy:Group therapy  Treatment Goals addressed: Coping  Interventions:CBT; Solution focused; Supportive; Reframing  Summary:12:00 - 12:50: Cln led discussion on communicating our struggles with our support team. Cln introduced the concept of giving disclosures to the people close to [name] regarding the way we act in specific situations or the way we process certain stimuli. Group members discussed ways to provide this "road map to our brain" and in what situations this may be helpful to them  12:50 -1:00 Clinician led check-out.  Clinician assessed for immediate needs,  medication compliance and efficacy, and safety concerns  Therapist Response:12:50 - 1:00: Pt engaged in discussion and is able to determine situations in which providing a disclosure can be helpful and practiced doing so.  12:50 - 1:00: Pt did not complete checkout due to feeling "overwhelmed" and cln met with pt 1-on-1. Pt states feeling triggered during group and is unable to say why. Pt states she feels frustrated that she is not processing her trauma and that she believes that is the only way she will get better. Cln reminded pt that establishing safety and ability to manage whatever comes up in a trauma session is important before processing trauma and that can be a goal for group. Pt reports having self-harm thoughts and denies SI. Pt states she feels safe and will call emergency services if things escalate.     Suicidal/Homicidal: Nowithout intent/plan  Plan: Pt will continue in PHP while working to decrease depression and trauma symptoms, increase ability to manage symptoms in a healthy manner, and increase emotional regulation.   Diagnosis: Severe recurrent major depression without psychotic features ([place]) [F33.2]    1. Severe recurrent major depression without psychotic features ([place])   2. PTSD (post-traumatic stress disorder)       Lorin Glass, LCSW 01/26/2021

## 2021-01-27 ENCOUNTER — Telehealth: Payer: Self-pay | Admitting: *Deleted

## 2021-01-27 ENCOUNTER — Encounter (HOSPITAL_BASED_OUTPATIENT_CLINIC_OR_DEPARTMENT_OTHER): Payer: Self-pay | Admitting: Obstetrics and Gynecology

## 2021-01-27 ENCOUNTER — Other Ambulatory Visit: Payer: Self-pay

## 2021-01-27 NOTE — Telephone Encounter (Signed)
Questions answered and Helene Kelp has signed. Left paper work in office box to be sent along with her office notes.

## 2021-01-27 NOTE — Progress Notes (Signed)
SPOKE WITH DR Mateo Flow HATCHETT MDA AND REVIEWED PT HISTORY AND BMI 45.19, PATIENT IS TO BE MOVED TO [facility] MAIN OR PER DR FRANKLIN  HATCHETT MDA, CALLED AND MADE JILL AWARE PER DR HATCHETT PT IS TO BE MOVED TO [facility] MAIN OR.

## 2021-01-27 NOTE — Progress Notes (Signed)
Spoke w/ via phone for pre-op interview---pt Lab needs dos----  Urine preg             Lab results------has lab appt 01-31-2021 1430 for cbc bmp T & S COVID test ----- covid test 01-31-2021 1300 Arrive at -------530 am 02-04-2021 NPO after MN NO Solid Food.  Clear liquids from MN until---430 am  Then npo Med rec completed Medications to take morning of surgery -----propranolol, certrizine, klonopin Diabetic medication -----n/a photo id and insurance card day of surgery Patient aware to have Driver (ride ) / caregiver  Spouse colby Scearce will drop pt off   for 24 hours after surgery  Patient Special Instructions ----- pt given extended/possible overnight stay instructions Pre-Op special Istructions -----n/a Patient verbalized understanding of instructions that were given at this phone interview. Patient denies shortness of breath, chest pain, fever, cough at this phone interview.

## 2021-01-27 NOTE — Telephone Encounter (Signed)
Noted  

## 2021-01-27 NOTE — Progress Notes (Signed)
YOU ARE SCHEDULED FOR A COVID TEST 01-31-2021 @100  PM. THIS TEST MUST BE DONE BEFORE SURGERY. GO TO  [place]. [place], [place], IT IS APPROXIMATELY 2 MINUTES PAST [employer] ON THE RIGHT AND REMAIN IN YOUR CAR, THIS IS A DRIVE UP TEST.     Your procedure is scheduled on 02-04-2021  Report to [facility].   Call this number if you have problems the morning of surgery  [phone].   OUR ADDRESS IS [address].  WE ARE LOCATED IN [facility].  PLEASE BRING YOUR INSURANCE CARD AND PHOTO ID DAY OF SURGERY.  ONLY ONE PERSON ALLOWED IN FACILITY WAITING AREA.                                     REMEMBER:  DO NOT EAT FOOD, CANDY GUM OR MINTS  AFTER MIDNIGHT . YOU MAY HAVE CLEAR LIQUIDS FROM MIDNIGHT UNTIL 430 AM. NO CLEAR LIQUIDS AFTER  430 AM. YOU MAY  BRUSH YOUR TEETH MORNING OF SURGERY AND RINSE YOUR MOUTH OUT, NO CHEWING GUM CANDY OR MINTS.    CLEAR LIQUID DIET   Foods Allowed                                                                     Foods Excluded  Coffee and tea, regular and decaf                             liquids that you cannot  Plain Jell-O any favor except red or purple                                           see through such as: Fruit ices (not with fruit pulp)                                     milk, soups, orange juice  Iced Popsicles                                    All solid food Carbonated beverages, regular and diet                                    Cranberry, grape and apple juices Sports drinks like Gatorade Lightly seasoned clear broth or consume(fat free) Sugar, honey syrup  Sample Menu Breakfast                                Lunch  Supper Cranberry juice                    Beef broth                            Chicken broth Jell-O                                     Grape juice                           Apple juice Coffee or tea                         Jell-O                                      Popsicle                                                Coffee or tea                        Coffee or tea  _____________________________________________________________________     TAKE THESE MEDICATIONS MORNING OF SURGERY WITH A SIP OF WATER:  PROPRANOLOL, CERTRIZINE, KLONOPIN.  ONE VISITOR IS ALLOWED IN WAITING ROOM ONLY DAY OF SURGERY.  NO VISITOR MAY SPEND THE NIGHT.  VISITOR ARE ALLOWED TO STAY UNTIL 800 PM.                                    DO NOT WEAR JEWERLY, MAKE UP. DO NOT WEAR LOTIONS, POWDERS, PERFUMES OR DEODORANT. DO NOT SHAVE FOR 24 HOURS PRIOR TO DAY OF SURGERY. MEN MAY SHAVE FACE AND NECK. CONTACTS, GLASSES, OR DENTURES MAY NOT BE WORN TO SURGERY.                                    [facility] IS NOT RESPONSIBLE  FOR ANY BELONGINGS.                                                                    [name]            - Preparing for Surgery Before surgery, you can play an important role.  Because skin is not sterile, your skin needs to be as free of germs as possible.  You can reduce the number of germs on your skin by washing with CHG (chlorahexidine gluconate) soap before surgery.  CHG is an antiseptic cleaner which kills germs and bonds with the skin to continue killing germs even after washing. Please DO NOT use if you have an allergy to CHG or antibacterial soaps.  If your skin becomes  reddened/irritated stop using the CHG and inform your nurse when you arrive at Short Stay. Do not shave (including legs and underarms) for at least 48 hours prior to the first CHG shower.  You may shave your face/neck. Please follow these instructions carefully:  1.  Shower with CHG Soap the night before surgery and the  morning of Surgery.  2.  If you choose to wash your hair, wash your hair first as usual with your  normal  shampoo.  3.  After you shampoo, rinse your hair and body thoroughly to remove the  shampoo.                             4.  Use CHG as you would any other liquid soap.  You can apply chg directly  to the skin and wash                      Gently with a scrungie or clean washcloth.  5.  Apply the CHG Soap to your body ONLY FROM THE NECK DOWN.   Do not use on face/ open                           Wound or open sores. Avoid contact with eyes, ears mouth and genitals (private parts).                       Wash face,  Genitals (private parts) with your normal soap.             6.  Wash thoroughly, paying special attention to the area where your surgery  will be performed.  7.  Thoroughly rinse your body with warm water from the neck down.  8.  DO NOT shower/wash with your normal soap after using and rinsing off  the CHG Soap.                9.  Pat yourself dry with a clean towel.            10.  Wear clean pajamas.            11.  Place clean sheets on your bed the night of your first shower and do not  sleep with pets. Day of Surgery : Do not apply any lotions/deodorants the morning of surgery.  Please wear clean clothes to the hospital/surgery center.  FAILURE TO FOLLOW THESE INSTRUCTIONS MAY RESULT IN THE CANCELLATION OF YOUR SURGERY PATIENT SIGNATURE_________________________________  NURSE SIGNATURE__________________________________  ________________________________________________________________________                                                        QUESTIONS Hansel Feinstein PRE OP NURSE PHONE [phone]

## 2021-01-27 NOTE — Telephone Encounter (Signed)
Call received from [place], [facility] PAT.  Was advised per anesthesia BMI 45.19, patient will need to be rescheduled in [facility] OR.  Ivin Booty states she has notified patient.

## 2021-01-28 ENCOUNTER — Other Ambulatory Visit (HOSPITAL_COMMUNITY): Payer: Self-pay

## 2021-01-28 NOTE — Telephone Encounter (Signed)
Spoke with Ivin Booty at [facility].  Was advised per anesthesia, patients with BMI 45 -50 will be evaluated by anesthesia and then determined if appropriate for [facility] or [facility] OR. Patient also has a history of sleep apnea. Anesthesia is advising surgery be moved to [facility] Main OR.

## 2021-01-28 NOTE — Telephone Encounter (Signed)
Spoke with Myriam Jacobson in [facility] scheduling.  Patient surgery rescheduled to 02/04/21, 0730 at [facility].   Patient notified.  Patient is agreeable to date and time.   Routing to provider for final review. Patient is agreeable to disposition. Will close encounter.  Cc: KimAlexis

## 2021-01-28 NOTE — Telephone Encounter (Signed)
Call returned to [facility] PAT. Left message to call Sharee Pimple, RN at [facility], [phone].  Reviewed with Dr. Talbert Nan on 5/16.  BMI less than 50, meets requirements for [facility]. Request to return call for additional information as to why case needs to be moved to main OR.

## 2021-01-29 DIAGNOSIS — F4310 Post-traumatic stress disorder, unspecified: Secondary | ICD-10-CM | POA: Diagnosis not present

## 2021-01-30 NOTE — Progress Notes (Signed)
Surgical Instructions    Your procedure is scheduled on 02/04/21.  Report to [facility] Main Entrance "A" at 05:30 A.M., then check in with the Admitting office.  Call this number if you have problems the morning of surgery:  [phone]   If you have any questions prior to your surgery date call [phone]: Open Monday-Friday 8am-4pm    Remember:  Do not eat after midnight the night before your surgery  You may drink clear liquids until 04:30am the morning of your surgery.   Clear liquids allowed are: Water, Non-Citrus Juices (without pulp), Carbonated Beverages, Clear Tea, Black Coffee Only, and Gatorade    Take these medicines the morning of surgery with A SIP OF WATER  Eye drops clonazePAM (KLONOPIN) propranolol (INDERAL)   As of today, STOP taking any Aspirin (unless otherwise instructed by your surgeon) Aleve, Naproxen, Ibuprofen, Motrin, Advil, Goody's, BC's, all herbal medications, fish oil, and all vitamins.                     Do not wear jewelry, make up, or nail polish            Do not wear lotions, powders, perfumes, or deodorant.            Do not shave 48 hours prior to surgery.              Do not bring valuables to the hospital.            [facility] is not responsible for any belongings or valuables.  Do NOT Smoke (Tobacco/Vaping) or drink Alcohol 24 hours prior to your procedure If you use a CPAP at night, you may bring all equipment for your overnight stay.   Contacts, glasses, dentures or bridgework may not be worn into surgery, please bring cases for these belongings   For patients admitted to the hospital, discharge time will be determined by your treatment team.   Patients discharged the day of surgery will not be allowed to drive home, and someone needs to stay with them for 24 hours.    Special instructions:    Oral Hygiene is also important to reduce your risk of infection.  Remember - BRUSH YOUR TEETH THE MORNING OF SURGERY WITH YOUR REGULAR  TOOTHPASTE   [place]- Preparing For Surgery  Before surgery, you can play an important role. Because skin is not sterile, your skin needs to be as free of germs as possible. You can reduce the number of germs on your skin by washing with CHG (chlorahexidine gluconate) Soap before surgery.  CHG is an antiseptic cleaner which kills germs and bonds with the skin to continue killing germs even after washing.     Please do not use if you have an allergy to CHG or antibacterial soaps. If your skin becomes reddened/irritated stop using the CHG.  Do not shave (including legs and underarms) for at least 48 hours prior to first CHG shower. It is OK to shave your face.  Please follow these instructions carefully.    1.  Shower the NIGHT BEFORE SURGERY and the MORNING OF SURGERY with CHG Soap.   If you chose to wash your hair, wash your hair first as usual with your normal shampoo. After you shampoo, rinse your hair and body thoroughly to remove the shampoo.  Then [employer] and genitals (private parts) with your normal soap and rinse thoroughly to remove soap.  2. After that Use CHG Soap as you  would any other liquid soap. You can apply CHG directly to the skin and wash gently with a scrungie or a clean washcloth.   3. Apply the CHG Soap to your body ONLY FROM THE NECK DOWN.  Do not use on open wounds or open sores. Avoid contact with your eyes, ears, mouth and genitals (private parts). Wash Face and genitals (private parts)  with your normal soap.   4. Wash thoroughly, paying special attention to the area where your surgery will be performed.  5. Thoroughly rinse your body with warm water from the neck down.  6. DO NOT shower/wash with your normal soap after using and rinsing off the CHG Soap.  7. Pat yourself dry with a CLEAN TOWEL.  8. Wear CLEAN PAJAMAS to bed the night before surgery  9. Place CLEAN SHEETS on your bed the night before your surgery  10. DO NOT SLEEP WITH PETS.   Day  of Surgery: Take a shower with CHG soap. Wear Clean/Comfortable clothing the morning of surgery Do not apply any deodorants/lotions.   Remember to brush your teeth WITH YOUR REGULAR TOOTHPASTE.   Please read over the following fact sheets that you were given.

## 2021-01-30 NOTE — Psych (Signed)
Virtual Visit via Video Note  I connected with Caroline Gonzales on 12/26/20 at  9:00 AM EDT by a video enabled telemedicine application and verified that I am speaking with the correct person using two identifiers.  Location: Patient: patient home Provider: clinical home office   I discussed the limitations of evaluation and management by telemedicine and the availability of in person appointments. The patient expressed understanding and agreed to proceed.  I discussed the assessment and treatment plan with the patient. The patient was provided an opportunity to ask questions and all were answered. The patient agreed with the plan and demonstrated an understanding of the instructions.   The patient was advised to call back or seek an in-person evaluation if the symptoms worsen or if the condition fails to improve as anticipated.  Pt was provided 240 minutes of non-face-to-face time during this encounter.   Lorin Glass, LCSW    [facility] PHP THERAPIST PROGRESS NOTE  Caroline Gonzales [id number]  Session Time: 9:00 - 10:00  Participation Level: Active  Behavioral Response: CasualAlertDepressed  Type of Therapy: Group Therapy  Treatment Goals addressed: Coping  Interventions: CBT, DBT, Supportive and Reframing  Summary: Clinician led check-in regarding current stressors and situation.Clinician utilized active listening and empathetic response and validated patient emotions. Clinician facilitated processing group on pertinent issues.   Therapist Response: Caroline Gonzales is a 45 y.o. female who presents with depression and trauma symptoms.  Patient arrived within time allowed and reports that she is feeling "hopeful." Patient rates hermood at a7.5on a scale of 1-10 with 10 being great. Pt reports friends visited yesterday and it felt good to be around support. Pt states being able to be present around them. Pt reports no self-harm thoughts since Tuesday. Pt able to process.  Pt engaged in discussion.      Session Time: 10:00 - 11:00   Participation Level:Active  Behavioral Response:CasualAlertDepressed  Type of Therapy:GroupTherapy  Treatment Goals addressed: Coping  Interventions:CBT, DBT, Supportive and Reframing  Summary:Cln led discussion on "work arounds" or finding intermediate solutions while we are in the process of working on a final solution. Group members shared situations in which they are struggling with an issue that they are unable to fix quickly. Cln encouraged pt's to consider ways to make things "doable" and not let "perfect" stand in the way.   Therapist Response:Pt engaged in discussion and is able to connect discussion to her family.      Session Time: 11:00- 12:00  Participation Level:Active  Behavioral Response:CasualAlertDepressed  Type of Therapy: Group Therapy, OT  Treatment Goals addressed: Coping  Interventions:Psychosocial skills training, Supportive  Summary:Occupational Therapy group  Therapist Response:Patient engaged in group. See OT note.      Session Time: 12:00 -1:00  Participation Level:Minimal  Behavioral Response:CasualAlertDepressed  Type of Therapy:Grouptherapy  Treatment Goals addressed: Coping  Interventions:CBT; Solution focused; Supportive; Reframing  Summary:12:00 - 12:50:Cln introduced DBT distress tolerance distraction skills. Cln provides context for distraction skills and why distraction is a foundational way to manage mood dysregulation. Group discussed how to apply distraction.  12:50 -1:00 Clinician led check-out. Clinician assessed for immediate needs, medication compliance and efficacy, and safety concerns  Therapist Response:12:50 - 1:00: Pt minimally engaged in discussion and reports understanding of how distraction can be applied to manage big feelings. 12:50 - 1:00: At check-out, patientrates hermood at 0.5on a  scale of 1-10 with 10 being great.Pt reports she got in her head during this group session and her thoughts turned  negative.  Cln met with pt 1-on-1 after group and pt expresses struggling with her trauma flashbacks and feeling that she needs individual attention and that group is not meeting her current needs. Pt is tearful and states self harm thoughts however is able to contract for safety. Pt reports she will be meeting with her therapist tomorrow and will seek her advice about the best plan moving forward.    Suicidal/Homicidal: Nowithout intent/plan  Plan: Pt will continue in PHP while working to decrease depression and trauma symptoms, increase ability to manage symptoms in a healthy manner, and increase emotional regulation.  Added 5/20: Pt will discharge from [facility] per pt's request. Provider has been informed and is in alignment. Pt will continue with her current OP therapist and has plans to meet with her 3x/week to do trauma work. Pt denies current SI/HI and self-harm. Pt has declined further help setting up follow-up care.   Diagnosis: MDD (major depressive disorder), severe ([place]) [F32.2]    1. MDD (major depressive disorder), severe ([place])   2. Generalized anxiety disorder   3. Insomnia, unspecified type       Lorin Glass, LCSW 01/30/2021

## 2021-01-31 ENCOUNTER — Encounter (HOSPITAL_COMMUNITY): Payer: Self-pay

## 2021-01-31 ENCOUNTER — Encounter (HOSPITAL_COMMUNITY): Payer: 59

## 2021-01-31 ENCOUNTER — Encounter (HOSPITAL_COMMUNITY)
Admission: RE | Admit: 2021-01-31 | Discharge: 2021-01-31 | Disposition: A | Discharge status: Home / Self Care | Payer: 59 | Source: Ambulatory Visit | Attending: Obstetrics and Gynecology | Admitting: Obstetrics and Gynecology

## 2021-01-31 ENCOUNTER — Encounter: Payer: Self-pay | Admitting: Physician Assistant

## 2021-01-31 ENCOUNTER — Ambulatory Visit (INDEPENDENT_AMBULATORY_CARE_PROVIDER_SITE_OTHER): Payer: 59 | Admitting: Physician Assistant

## 2021-01-31 ENCOUNTER — Encounter (HOSPITAL_COMMUNITY): Admission: RE | Admit: 2021-01-31 | Payer: 59 | Source: Ambulatory Visit

## 2021-01-31 ENCOUNTER — Other Ambulatory Visit: Payer: Self-pay

## 2021-01-31 ENCOUNTER — Encounter: Payer: Self-pay | Admitting: Obstetrics and Gynecology

## 2021-01-31 ENCOUNTER — Other Ambulatory Visit (HOSPITAL_COMMUNITY)
Admission: RE | Admit: 2021-01-31 | Discharge: 2021-01-31 | Disposition: A | Discharge status: Home / Self Care | Payer: 59 | Source: Ambulatory Visit | Attending: Obstetrics and Gynecology | Admitting: Obstetrics and Gynecology

## 2021-01-31 DIAGNOSIS — F411 Generalized anxiety disorder: Secondary | ICD-10-CM

## 2021-01-31 DIAGNOSIS — Z01818 Encounter for other preprocedural examination: Secondary | ICD-10-CM | POA: Diagnosis not present

## 2021-01-31 DIAGNOSIS — F3341 Major depressive disorder, recurrent, in partial remission: Secondary | ICD-10-CM

## 2021-01-31 DIAGNOSIS — F515 Nightmare disorder: Secondary | ICD-10-CM

## 2021-01-31 DIAGNOSIS — Z20822 Contact with and (suspected) exposure to covid-19: Secondary | ICD-10-CM | POA: Insufficient documentation

## 2021-01-31 DIAGNOSIS — F4310 Post-traumatic stress disorder, unspecified: Secondary | ICD-10-CM

## 2021-01-31 LAB — CBC
HCT: 36.9 % (ref 36.0–46.0)
Hemoglobin: 11.8 g/dL — ABNORMAL LOW (ref 12.0–15.0)
MCH: 27.5 pg (ref 26.0–34.0)
MCHC: 32 g/dL (ref 30.0–36.0)
MCV: 86 fL (ref 80.0–100.0)
Platelets: 255 10*3/uL (ref 150–400)
RBC: 4.29 MIL/uL (ref 3.87–5.11)
RDW: 14.6 % (ref 11.5–15.5)
WBC: 7.7 10*3/uL (ref 4.0–10.5)
nRBC: 0 % (ref 0.0–0.2)

## 2021-01-31 LAB — BASIC METABOLIC PANEL
Anion gap: 6 (ref 5–15)
BUN: 13 mg/dL (ref 6–20)
CO2: 27 mmol/L (ref 22–32)
Calcium: 8.9 mg/dL (ref 8.9–10.3)
Chloride: 105 mmol/L (ref 98–111)
Creatinine, Ser: 1.05 mg/dL — ABNORMAL HIGH (ref 0.44–1.00)
GFR, Estimated: >60 mL/min (ref >60)
Glucose, Bld: 121 mg/dL — ABNORMAL HIGH (ref 70–99)
Potassium: 3.9 mmol/L (ref 3.5–5.1)
Sodium: 138 mmol/L (ref 135–145)

## 2021-01-31 LAB — SARS CORONAVIRUS 2 (TAT 6-24 HRS): SARS Coronavirus 2: NEGATIVE

## 2021-01-31 LAB — TYPE AND SCREEN
ABO/RH(D): A POS
Antibody Screen: NEGATIVE
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
## 2021-01-31 NOTE — Progress Notes (Signed)
PCP - Letta Median Cardiologist - denies  PPM/ICD - denies   Chest x-ray - n/a EKG - n/a Stress Test -denies  ECHO - denies Cardiac Cath - denies  Sleep Study - yes CPAP - no cpap needed, diagnosed with Hypopnea  No diabetes  Patient instructed to hold all Aspirin, NSAID's, herbal medications, fish oil and vitamins 7 days prior to surgery.   ERAS Protcol -yes   COVID TEST- 01/31/21 at drive thru   Anesthesia review: no  Patient denies shortness of breath, fever, cough and chest pain at PAT appointment   All instructions explained to the patient, with a verbal understanding of the material. Patient agrees to go over the instructions while at home for a better understanding. Patient also instructed to self quarantine after being tested for COVID-19. The opportunity to ask questions was provided.

## 2021-02-03 ENCOUNTER — Other Ambulatory Visit (HOSPITAL_COMMUNITY): Payer: Self-pay

## 2021-02-03 ENCOUNTER — Other Ambulatory Visit: Payer: Self-pay | Admitting: Physician Assistant

## 2021-02-03 ENCOUNTER — Encounter: Payer: Self-pay | Admitting: Obstetrics and Gynecology

## 2021-02-03 DIAGNOSIS — F4310 Post-traumatic stress disorder, unspecified: Secondary | ICD-10-CM | POA: Diagnosis not present

## 2021-02-03 MED ORDER — PRAZOSIN HCL 2 MG PO CAPS
8.0000 mg | ORAL_CAPSULE | Freq: Every day | ORAL | 1 refills | Status: DC
  Filled 2021-02-03: qty 360, 90d supply, fill #0
  Filled 2021-05-04: qty 360, 90d supply, fill #1

## 2021-02-03 NOTE — Telephone Encounter (Signed)
Pt lm on vm requesting Rx for mini press 2 mg. Apt 8/16

## 2021-02-04 ENCOUNTER — Other Ambulatory Visit: Payer: Self-pay

## 2021-02-04 ENCOUNTER — Encounter (HOSPITAL_COMMUNITY)
Admission: RE | Disposition: A | Discharge status: Home / Self Care | Payer: Self-pay | Source: Ambulatory Visit | Attending: Obstetrics and Gynecology

## 2021-02-04 ENCOUNTER — Other Ambulatory Visit (HOSPITAL_COMMUNITY): Payer: Self-pay

## 2021-02-04 ENCOUNTER — Encounter (HOSPITAL_COMMUNITY): Payer: Self-pay | Admitting: Obstetrics and Gynecology

## 2021-02-04 ENCOUNTER — Ambulatory Visit (HOSPITAL_COMMUNITY): Payer: 59 | Admitting: Certified Registered Nurse Anesthetist

## 2021-02-04 ENCOUNTER — Ambulatory Visit (HOSPITAL_COMMUNITY): Payer: 59 | Admitting: Vascular Surgery

## 2021-02-04 ENCOUNTER — Ambulatory Visit (HOSPITAL_COMMUNITY)
Admission: RE | Admit: 2021-02-04 | Discharge: 2021-02-04 | Disposition: A | Discharge status: Home / Self Care | Payer: 59 | Source: Ambulatory Visit | Attending: Obstetrics and Gynecology | Admitting: Obstetrics and Gynecology

## 2021-02-04 DIAGNOSIS — E559 Vitamin D deficiency, unspecified: Secondary | ICD-10-CM | POA: Diagnosis not present

## 2021-02-04 DIAGNOSIS — Z9049 Acquired absence of other specified parts of digestive tract: Secondary | ICD-10-CM | POA: Insufficient documentation

## 2021-02-04 DIAGNOSIS — Z90710 Acquired absence of both cervix and uterus: Secondary | ICD-10-CM | POA: Diagnosis present

## 2021-02-04 DIAGNOSIS — Z62810 Personal history of physical and sexual abuse in childhood: Secondary | ICD-10-CM | POA: Insufficient documentation

## 2021-02-04 DIAGNOSIS — Z6841 Body Mass Index (BMI) 40.0 and over, adult: Secondary | ICD-10-CM | POA: Diagnosis not present

## 2021-02-04 DIAGNOSIS — N800 Endometriosis of uterus: Secondary | ICD-10-CM | POA: Diagnosis not present

## 2021-02-04 DIAGNOSIS — Z79899 Other long term (current) drug therapy: Secondary | ICD-10-CM | POA: Insufficient documentation

## 2021-02-04 DIAGNOSIS — D259 Leiomyoma of uterus, unspecified: Secondary | ICD-10-CM | POA: Insufficient documentation

## 2021-02-04 DIAGNOSIS — F4310 Post-traumatic stress disorder, unspecified: Secondary | ICD-10-CM | POA: Insufficient documentation

## 2021-02-04 DIAGNOSIS — F329 Major depressive disorder, single episode, unspecified: Secondary | ICD-10-CM | POA: Insufficient documentation

## 2021-02-04 DIAGNOSIS — G4700 Insomnia, unspecified: Secondary | ICD-10-CM | POA: Insufficient documentation

## 2021-02-04 DIAGNOSIS — Z87891 Personal history of nicotine dependence: Secondary | ICD-10-CM | POA: Insufficient documentation

## 2021-02-04 DIAGNOSIS — Z91040 Latex allergy status: Secondary | ICD-10-CM | POA: Insufficient documentation

## 2021-02-04 DIAGNOSIS — R7303 Prediabetes: Secondary | ICD-10-CM | POA: Diagnosis not present

## 2021-02-04 DIAGNOSIS — Z880 Allergy status to penicillin: Secondary | ICD-10-CM | POA: Insufficient documentation

## 2021-02-04 DIAGNOSIS — Z881 Allergy status to other antibiotic agents status: Secondary | ICD-10-CM | POA: Diagnosis not present

## 2021-02-04 DIAGNOSIS — E7849 Other hyperlipidemia: Secondary | ICD-10-CM | POA: Diagnosis not present

## 2021-02-04 DIAGNOSIS — Z888 Allergy status to other drugs, medicaments and biological substances status: Secondary | ICD-10-CM | POA: Insufficient documentation

## 2021-02-04 DIAGNOSIS — N736 Female pelvic peritoneal adhesions (postinfective): Secondary | ICD-10-CM | POA: Diagnosis not present

## 2021-02-04 HISTORY — PX: CYSTOSCOPY: SHX5120

## 2021-02-04 HISTORY — PX: TOTAL LAPAROSCOPIC HYSTERECTOMY WITH SALPINGECTOMY: SHX6742

## 2021-02-04 LAB — CBC
HCT: 37.3 % (ref 36.0–46.0)
Hemoglobin: 11.7 g/dL — ABNORMAL LOW (ref 12.0–15.0)
MCH: 27 pg (ref 26.0–34.0)
MCHC: 31.4 g/dL (ref 30.0–36.0)
MCV: 85.9 fL (ref 80.0–100.0)
Platelets: 244 10*3/uL (ref 150–400)
RBC: 4.34 MIL/uL (ref 3.87–5.11)
RDW: 14.2 % (ref 11.5–15.5)
WBC: 8.8 10*3/uL (ref 4.0–10.5)
nRBC: 0 % (ref 0.0–0.2)

## 2021-02-04 LAB — ABO/RH: ABO/RH(D): A POS

## 2021-02-04 LAB — POCT PREGNANCY, URINE: Preg Test, Ur: NEGATIVE

## 2021-02-04 SURGERY — HYSTERECTOMY, TOTAL, LAPAROSCOPIC, WITH SALPINGECTOMY
Anesthesia: General

## 2021-02-04 MED ORDER — HEMOSTATIC AGENTS (NO CHARGE) OPTIME
TOPICAL | Status: DC | PRN
  Administered 2021-02-04: 2 via TOPICAL

## 2021-02-04 MED ORDER — HYDROMORPHONE HCL 1 MG/ML IJ SOLN: 0.2000 mg | INTRAMUSCULAR | Status: DC | PRN

## 2021-02-04 MED ORDER — DOCUSATE SODIUM 100 MG PO CAPS: 100.0000 mg | ORAL_CAPSULE | Freq: Two times a day (BID) | ORAL | 0 refills | Status: DC

## 2021-02-04 MED ORDER — PANTOPRAZOLE SODIUM 40 MG PO TBEC
40.0000 mg | DELAYED_RELEASE_TABLET | Freq: Every day | ORAL | Status: DC
  Administered 2021-02-04: 40 mg via ORAL
  Filled 2021-02-04: qty 1

## 2021-02-04 MED ORDER — SODIUM CHLORIDE 0.9 % IR SOLN
Status: DC | PRN
  Administered 2021-02-04: 1000 mL

## 2021-02-04 MED ORDER — LIDOCAINE 2% (20 MG/ML) 5 ML SYRINGE
INTRAMUSCULAR | Status: DC | PRN
  Administered 2021-02-04: 100 mg via INTRAVENOUS

## 2021-02-04 MED ORDER — ONDANSETRON HCL 4 MG/2ML IJ SOLN
INTRAMUSCULAR | Status: DC | PRN
  Administered 2021-02-04: 4 mg via INTRAVENOUS

## 2021-02-04 MED ORDER — TRIAMCINOLONE ACETONIDE 55 MCG/ACT NA AERO
1.0000 | INHALATION_SPRAY | Freq: Every day | NASAL | Status: DC
  Filled 2021-02-04: qty 10.8

## 2021-02-04 MED ORDER — SODIUM CHLORIDE 0.9 % IV SOLN
INTRAVENOUS | Status: DC | PRN
  Administered 2021-02-04: 120 mL

## 2021-02-04 MED ORDER — ACETAMINOPHEN 500 MG PO TABS: 1000.0000 mg | ORAL_TABLET | Freq: Four times a day (QID) | ORAL | 0 refills | Status: DC

## 2021-02-04 MED ORDER — KETAMINE HCL 10 MG/ML IJ SOLN
INTRAMUSCULAR | Status: DC | PRN
  Administered 2021-02-04: 20 mg via INTRAVENOUS
  Administered 2021-02-04: 30 mg via INTRAVENOUS

## 2021-02-04 MED ORDER — FENTANYL CITRATE (PF) 100 MCG/2ML IJ SOLN
INTRAMUSCULAR | Status: AC
  Filled 2021-02-04: qty 2

## 2021-02-04 MED ORDER — ROCURONIUM BROMIDE 10 MG/ML (PF) SYRINGE
PREFILLED_SYRINGE | INTRAVENOUS | Status: AC
  Filled 2021-02-04: qty 20

## 2021-02-04 MED ORDER — 0.9 % SODIUM CHLORIDE (POUR BTL) OPTIME
TOPICAL | Status: DC | PRN
  Administered 2021-02-04: 1000 mL

## 2021-02-04 MED ORDER — BUPIVACAINE HCL (PF) 0.25 % IJ SOLN
INTRAMUSCULAR | Status: AC
  Filled 2021-02-04: qty 30

## 2021-02-04 MED ORDER — OXYCODONE HCL 5 MG PO TABS: 5.0000 mg | ORAL_TABLET | Freq: Once | ORAL | Status: DC | PRN

## 2021-02-04 MED ORDER — LACTATED RINGERS IV SOLN: INTRAVENOUS | Status: DC

## 2021-02-04 MED ORDER — DOCUSATE SODIUM 100 MG PO CAPS
100.0000 mg | ORAL_CAPSULE | Freq: Two times a day (BID) | ORAL | Status: DC
  Administered 2021-02-04: 100 mg via ORAL
  Filled 2021-02-04: qty 1

## 2021-02-04 MED ORDER — CHLORHEXIDINE GLUCONATE 0.12 % MT SOLN: 15.0000 mL | Freq: Once | OROMUCOSAL | Status: DC

## 2021-02-04 MED ORDER — LIDOCAINE 2% (20 MG/ML) 5 ML SYRINGE
INTRAMUSCULAR | Status: AC
  Filled 2021-02-04: qty 5

## 2021-02-04 MED ORDER — ROCURONIUM BROMIDE 10 MG/ML (PF) SYRINGE
PREFILLED_SYRINGE | INTRAVENOUS | Status: DC | PRN
  Administered 2021-02-04: 20 mg via INTRAVENOUS
  Administered 2021-02-04: 80 mg via INTRAVENOUS
  Administered 2021-02-04: 20 mg via INTRAVENOUS

## 2021-02-04 MED ORDER — DEXMEDETOMIDINE (PRECEDEX) IN NS 20 MCG/5ML (4 MCG/ML) IV SYRINGE
PREFILLED_SYRINGE | INTRAVENOUS | Status: DC | PRN
  Administered 2021-02-04 (×2): 8 ug via INTRAVENOUS
  Administered 2021-02-04: 4 ug via INTRAVENOUS

## 2021-02-04 MED ORDER — ENOXAPARIN SODIUM 40 MG/0.4ML IJ SOSY
40.0000 mg | PREFILLED_SYRINGE | INTRAMUSCULAR | Status: AC
  Administered 2021-02-04: 40 mg via SUBCUTANEOUS
  Filled 2021-02-04: qty 0.4

## 2021-02-04 MED ORDER — MENTHOL 3 MG MT LOZG: 1.0000 | LOZENGE | OROMUCOSAL | Status: DC | PRN

## 2021-02-04 MED ORDER — FLUVOXAMINE MALEATE 50 MG PO TABS: 50.0000 mg | ORAL_TABLET | Freq: Every day | ORAL | Status: DC

## 2021-02-04 MED ORDER — GENTAMICIN SULFATE 40 MG/ML IJ SOLN
5.0000 mg/kg | INTRAVENOUS | Status: AC
  Administered 2021-02-04: 670 mg via INTRAVENOUS
  Filled 2021-02-04: qty 16.75

## 2021-02-04 MED ORDER — QUETIAPINE FUMARATE 50 MG PO TABS: 300.0000 mg | ORAL_TABLET | Freq: Every day | ORAL | Status: DC

## 2021-02-04 MED ORDER — PROPOFOL 10 MG/ML IV BOLUS
INTRAVENOUS | Status: AC
  Filled 2021-02-04: qty 20

## 2021-02-04 MED ORDER — CLONAZEPAM 0.5 MG PO TABS: 0.5000 mg | ORAL_TABLET | Freq: Three times a day (TID) | ORAL | Status: DC | PRN

## 2021-02-04 MED ORDER — MIDAZOLAM HCL 2 MG/2ML IJ SOLN
INTRAMUSCULAR | Status: DC | PRN
  Administered 2021-02-04: 2 mg via INTRAVENOUS

## 2021-02-04 MED ORDER — ONDANSETRON HCL 4 MG PO TABS: 4.0000 mg | ORAL_TABLET | Freq: Four times a day (QID) | ORAL | Status: DC | PRN

## 2021-02-04 MED ORDER — DEXAMETHASONE SODIUM PHOSPHATE 10 MG/ML IJ SOLN
INTRAMUSCULAR | Status: AC
  Filled 2021-02-04: qty 1

## 2021-02-04 MED ORDER — FENTANYL CITRATE (PF) 250 MCG/5ML IJ SOLN
INTRAMUSCULAR | Status: AC
  Filled 2021-02-04: qty 5

## 2021-02-04 MED ORDER — POLYVINYL ALCOHOL 1.4 % OP SOLN
1.0000 [drp] | OPHTHALMIC | Status: DC | PRN
  Filled 2021-02-04: qty 15

## 2021-02-04 MED ORDER — QUETIAPINE FUMARATE 50 MG PO TABS: 100.0000 mg | ORAL_TABLET | Freq: Every day | ORAL | Status: DC

## 2021-02-04 MED ORDER — GABAPENTIN 300 MG PO CAPS
300.0000 mg | ORAL_CAPSULE | ORAL | Status: AC
  Administered 2021-02-04: 300 mg via ORAL
  Filled 2021-02-04: qty 1

## 2021-02-04 MED ORDER — CHLORHEXIDINE GLUCONATE 0.12 % MT SOLN
OROMUCOSAL | Status: AC
  Filled 2021-02-04: qty 15

## 2021-02-04 MED ORDER — LORATADINE 10 MG PO TABS
10.0000 mg | ORAL_TABLET | Freq: Every day | ORAL | Status: DC
  Administered 2021-02-04: 10 mg via ORAL
  Filled 2021-02-04: qty 1

## 2021-02-04 MED ORDER — CARBOXYMETHYLCELLUL-GLYCERIN 0.5-0.9 % OP SOLN: 1.0000 [drp] | Freq: Four times a day (QID) | OPHTHALMIC | Status: DC | PRN

## 2021-02-04 MED ORDER — IBUPROFEN 600 MG PO TABS: 600.0000 mg | ORAL_TABLET | Freq: Four times a day (QID) | ORAL | Status: DC

## 2021-02-04 MED ORDER — KETAMINE HCL 50 MG/5ML IJ SOSY
PREFILLED_SYRINGE | INTRAMUSCULAR | Status: AC
  Filled 2021-02-04: qty 5

## 2021-02-04 MED ORDER — GABAPENTIN 300 MG PO CAPS: 600.0000 mg | ORAL_CAPSULE | Freq: Every day | ORAL | Status: DC

## 2021-02-04 MED ORDER — SODIUM CHLORIDE (PF) 0.9 % IJ SOLN
INTRAMUSCULAR | Status: AC
  Filled 2021-02-04: qty 50

## 2021-02-04 MED ORDER — ONDANSETRON HCL 4 MG/2ML IJ SOLN: 4.0000 mg | Freq: Once | INTRAMUSCULAR | Status: DC | PRN

## 2021-02-04 MED ORDER — DEXAMETHASONE SODIUM PHOSPHATE 10 MG/ML IJ SOLN
INTRAMUSCULAR | Status: DC | PRN
  Administered 2021-02-04: 10 mg via INTRAVENOUS

## 2021-02-04 MED ORDER — FENTANYL CITRATE (PF) 250 MCG/5ML IJ SOLN
INTRAMUSCULAR | Status: DC | PRN
  Administered 2021-02-04: 25 ug via INTRAVENOUS
  Administered 2021-02-04 (×4): 50 ug via INTRAVENOUS
  Administered 2021-02-04: 25 ug via INTRAVENOUS

## 2021-02-04 MED ORDER — KETOROLAC TROMETHAMINE 30 MG/ML IJ SOLN
INTRAMUSCULAR | Status: DC | PRN
  Administered 2021-02-04: 30 mg via INTRAVENOUS

## 2021-02-04 MED ORDER — PROPOFOL 1000 MG/100ML IV EMUL
INTRAVENOUS | Status: AC
  Filled 2021-02-04: qty 300

## 2021-02-04 MED ORDER — OXYCODONE HCL 5 MG PO TABS
5.0000 mg | ORAL_TABLET | ORAL | Status: DC | PRN
  Administered 2021-02-04 (×2): 5 mg via ORAL
  Filled 2021-02-04 (×2): qty 1

## 2021-02-04 MED ORDER — BUPIVACAINE HCL (PF) 0.25 % IJ SOLN
INTRAMUSCULAR | Status: DC | PRN
  Administered 2021-02-04: 9 mL

## 2021-02-04 MED ORDER — AMISULPRIDE (ANTIEMETIC) 5 MG/2ML IV SOLN: 10.0000 mg | Freq: Once | INTRAVENOUS | Status: DC | PRN

## 2021-02-04 MED ORDER — QUETIAPINE FUMARATE 400 MG PO TABS: 400.0000 mg | ORAL_TABLET | Freq: Every day | ORAL | Status: DC

## 2021-02-04 MED ORDER — ROPIVACAINE HCL 5 MG/ML IJ SOLN
INTRAMUSCULAR | Status: AC
  Filled 2021-02-04: qty 30

## 2021-02-04 MED ORDER — SUGAMMADEX SODIUM 200 MG/2ML IV SOLN
INTRAVENOUS | Status: DC | PRN
  Administered 2021-02-04: 300 mg via INTRAVENOUS

## 2021-02-04 MED ORDER — SCOPOLAMINE 1 MG/3DAYS TD PT72
MEDICATED_PATCH | TRANSDERMAL | Status: DC | PRN
  Administered 2021-02-04: 1 via TRANSDERMAL

## 2021-02-04 MED ORDER — PROPOFOL 10 MG/ML IV BOLUS
INTRAVENOUS | Status: DC | PRN
  Administered 2021-02-04: 125 ug/kg/min via INTRAVENOUS

## 2021-02-04 MED ORDER — CLONAZEPAM 1 MG PO TABS: 1.5000 mg | ORAL_TABLET | Freq: Every day | ORAL | Status: DC

## 2021-02-04 MED ORDER — KETOROLAC TROMETHAMINE 30 MG/ML IJ SOLN
INTRAMUSCULAR | Status: AC
  Filled 2021-02-04: qty 1

## 2021-02-04 MED ORDER — ACETAMINOPHEN 500 MG PO TABS
1000.0000 mg | ORAL_TABLET | Freq: Four times a day (QID) | ORAL | Status: DC
  Administered 2021-02-04 (×2): 1000 mg via ORAL
  Filled 2021-02-04 (×2): qty 2

## 2021-02-04 MED ORDER — STERILE WATER FOR IRRIGATION IR SOLN
Status: DC | PRN
  Administered 2021-02-04: 1000 mL via INTRAVESICAL

## 2021-02-04 MED ORDER — IBUPROFEN 800 MG PO TABS: 800.0000 mg | ORAL_TABLET | Freq: Three times a day (TID) | ORAL | 0 refills | Status: DC | PRN

## 2021-02-04 MED ORDER — FENTANYL CITRATE (PF) 100 MCG/2ML IJ SOLN
25.0000 ug | INTRAMUSCULAR | Status: DC | PRN
  Administered 2021-02-04 (×2): 50 ug via INTRAVENOUS

## 2021-02-04 MED ORDER — CLONAZEPAM 0.5 MG PO TABS
0.5000 mg | ORAL_TABLET | Freq: Every day | ORAL | Status: DC
  Filled 2021-02-04: qty 1

## 2021-02-04 MED ORDER — PROPOFOL 500 MG/50ML IV EMUL
INTRAVENOUS | Status: DC | PRN
  Administered 2021-02-04: 200 mg via INTRAVENOUS

## 2021-02-04 MED ORDER — OXYCODONE HCL 5 MG PO TABS: 5.0000 mg | ORAL_TABLET | ORAL | 0 refills | Status: DC | PRN

## 2021-02-04 MED ORDER — ONDANSETRON HCL 4 MG/2ML IJ SOLN: 4.0000 mg | Freq: Four times a day (QID) | INTRAMUSCULAR | Status: DC | PRN

## 2021-02-04 MED ORDER — MIDAZOLAM HCL 2 MG/2ML IJ SOLN
INTRAMUSCULAR | Status: AC
  Filled 2021-02-04: qty 2

## 2021-02-04 MED ORDER — OXYCODONE HCL 5 MG/5ML PO SOLN: 5.0000 mg | Freq: Once | ORAL | Status: DC | PRN

## 2021-02-04 MED ORDER — FLUVOXAMINE MALEATE 50 MG PO TABS
150.0000 mg | ORAL_TABLET | Freq: Every day | ORAL | Status: DC
  Filled 2021-02-04: qty 1

## 2021-02-04 MED ORDER — FLUVOXAMINE MALEATE 100 MG PO TABS: 100.0000 mg | ORAL_TABLET | Freq: Every day | ORAL | Status: DC

## 2021-02-04 MED ORDER — KETOROLAC TROMETHAMINE 30 MG/ML IJ SOLN
30.0000 mg | Freq: Four times a day (QID) | INTRAMUSCULAR | Status: DC
  Administered 2021-02-04 (×2): 30 mg via INTRAVENOUS
  Filled 2021-02-04 (×2): qty 1

## 2021-02-04 MED ORDER — ACETAMINOPHEN 500 MG PO TABS
1000.0000 mg | ORAL_TABLET | ORAL | Status: AC
Start: 2021-02-04 — End: 2021-02-04
  Administered 2021-02-04: 1000 mg via ORAL
  Filled 2021-02-04: qty 2

## 2021-02-04 MED ORDER — POVIDONE-IODINE 10 % EX SWAB
2.0000 "application " | Freq: Once | CUTANEOUS | Status: AC
  Administered 2021-02-04: 2 via TOPICAL

## 2021-02-04 MED ORDER — CLINDAMYCIN PHOSPHATE 900 MG/50ML IV SOLN
900.0000 mg | INTRAVENOUS | Status: AC
  Administered 2021-02-04: 900 mg via INTRAVENOUS
  Filled 2021-02-04: qty 50

## 2021-02-04 MED ORDER — PRAZOSIN HCL 2 MG PO CAPS
8.0000 mg | ORAL_CAPSULE | Freq: Every day | ORAL | Status: DC
  Filled 2021-02-04: qty 4

## 2021-02-04 MED ORDER — ONDANSETRON HCL 4 MG/2ML IJ SOLN
INTRAMUSCULAR | Status: AC
  Filled 2021-02-04: qty 2

## 2021-02-04 MED ORDER — PROPRANOLOL HCL 20 MG PO TABS: 40.0000 mg | ORAL_TABLET | Freq: Two times a day (BID) | ORAL | Status: DC

## 2021-02-04 MED ORDER — KCL IN DEXTROSE-NACL 20-5-0.45 MEQ/L-%-% IV SOLN
INTRAVENOUS | Status: DC
  Filled 2021-02-04: qty 1000

## 2021-02-04 SURGICAL SUPPLY — 66 items
APPLICATOR ARISTA FLEXITIP XL (MISCELLANEOUS) IMPLANT
CABLE HIGH FREQUENCY MONO STRZ (ELECTRODE) IMPLANT
CANISTER SUCT 3000ML PPV (MISCELLANEOUS) ×3 IMPLANT
CELL SAVER LIPIGURD (MISCELLANEOUS) IMPLANT
COVER MAYO STAND STRL (DRAPES) ×3 IMPLANT
COVER WAND RF STERILE (DRAPES) ×3 IMPLANT
DECANTER SPIKE VIAL GLASS SM (MISCELLANEOUS) ×9 IMPLANT
DERMABOND ADVANCED (GAUZE/BANDAGES/DRESSINGS) ×1
DERMABOND ADVANCED .7 DNX12 (GAUZE/BANDAGES/DRESSINGS) ×2 IMPLANT
DURAPREP 26ML APPLICATOR (WOUND CARE) ×3 IMPLANT
EXTRT SYSTEM ALEXIS 14CM (MISCELLANEOUS)
EXTRT SYSTEM ALEXIS 17CM (MISCELLANEOUS)
GLOVE ECLIPSE 6.5 STRL STRAW (GLOVE) ×3 IMPLANT
GLOVE SURG UNDER POLY LF SZ7 (GLOVE) ×12 IMPLANT
GOWN STRL REUS W/ TWL LRG LVL3 (GOWN DISPOSABLE) ×8 IMPLANT
GOWN STRL REUS W/TWL LRG LVL3 (GOWN DISPOSABLE) ×4
HARMONIC RUM II 2.5CM SILVER (DISPOSABLE) ×3
HARMONIC RUM II 3.0CM SILVER (DISPOSABLE)
HARMONIC RUM II 3.5CM SILVER (DISPOSABLE)
HARMONIC RUM II 4.0CM SILVER (DISPOSABLE)
HEMOSTAT ARISTA ABSORB 3G PWDR (HEMOSTASIS) IMPLANT
HIBICLENS CHG 4% 4OZ BTL (MISCELLANEOUS) ×3 IMPLANT
KIT TURNOVER KIT B (KITS) ×3 IMPLANT
LIGASURE VESSEL 5MM BLUNT TIP (ELECTROSURGICAL) ×3 IMPLANT
NEEDLE INSUFFLATION 14GA 120MM (NEEDLE) ×3 IMPLANT
PACK LAPAROSCOPY BASIN (CUSTOM PROCEDURE TRAY) ×3 IMPLANT
PACK TRENDGUARD 450 HYBRID PRO (MISCELLANEOUS) IMPLANT
PACK TRENDGUARD 600 HYBRD PROC (MISCELLANEOUS) IMPLANT
POUCH LAPAROSCOPIC INSTRUMENT (MISCELLANEOUS) ×3 IMPLANT
POWDER SURGICEL 3.0 GRAM (HEMOSTASIS) ×6 IMPLANT
PROTECTOR NERVE ULNAR (MISCELLANEOUS) ×6 IMPLANT
SCALPEL HRMNC RUM II 2.5 SILVR (DISPOSABLE) ×2 IMPLANT
SCALPEL HRMNC RUM II 3.0 SILVR (DISPOSABLE) IMPLANT
SCALPEL HRMNC RUM II 3.5 SILVR (DISPOSABLE) IMPLANT
SCALPEL HRMNC RUM II 4.0 SILVR (DISPOSABLE) IMPLANT
SCISSORS LAP 5X35 DISP (ENDOMECHANICALS) IMPLANT
SEALER TISSUE X1 STRG JAW 37MM (SHEATH) ×3 IMPLANT
SET CYSTO W/LG BORE CLAMP LF (SET/KITS/TRAYS/PACK) ×3 IMPLANT
SET IRRIG TUBING LAPAROSCOPIC (IRRIGATION / IRRIGATOR) ×3 IMPLANT
SET TRI-LUMEN FLTR TB AIRSEAL (TUBING) ×3 IMPLANT
SHEARS 1100 HARMONIC 36 (ELECTROSURGICAL) ×6 IMPLANT
SHEARS HARMONIC ACE PLUS 36CM (ENDOMECHANICALS) ×6 IMPLANT
SUT VIC AB 0 CT1 27 (SUTURE) ×1
SUT VIC AB 0 CT1 27XBRD ANBCTR (SUTURE) ×2 IMPLANT
SUT VICRYL 0 UR6 27IN ABS (SUTURE) ×3 IMPLANT
SUT VICRYL 4-0 PS2 18IN ABS (SUTURE) ×3 IMPLANT
SUT VLOC 180 0 9IN  GS21 (SUTURE)
SUT VLOC 180 0 9IN GS21 (SUTURE) IMPLANT
SYR 50ML LL SCALE MARK (SYRINGE) ×6 IMPLANT
SYSTEM CONTND EXTRCTN KII BLLN (MISCELLANEOUS) IMPLANT
TIP ENDOSCOPIC SURGICEL (TIP) ×3 IMPLANT
TIP RUMI ORANGE 6.7MMX12CM (TIP) IMPLANT
TIP UTERINE 5.1X6CM LAV DISP (MISCELLANEOUS) IMPLANT
TIP UTERINE 6.7X10CM GRN DISP (MISCELLANEOUS) IMPLANT
TIP UTERINE 6.7X6CM WHT DISP (MISCELLANEOUS) IMPLANT
TIP UTERINE 6.7X8CM BLUE DISP (MISCELLANEOUS) ×3 IMPLANT
TOWEL GREEN STERILE FF (TOWEL DISPOSABLE) ×6 IMPLANT
TRAY FOLEY W/BAG SLVR 14FR (SET/KITS/TRAYS/PACK) ×3 IMPLANT
TRENDGUARD 450 HYBRID PRO PACK (MISCELLANEOUS)
TRENDGUARD 600 HYBRID PROC PK (MISCELLANEOUS)
TROCAR ADV FIXATION 5X100MM (TROCAR) ×3 IMPLANT
TROCAR PORT AIRSEAL 5X120 (TROCAR) ×3 IMPLANT
TROCAR XCEL NON BLADE 8MM B8LT (ENDOMECHANICALS) ×3 IMPLANT
TROCAR XCEL NON-BLD 5MMX100MML (ENDOMECHANICALS) ×3 IMPLANT
UNDERPAD 30X36 HEAVY ABSORB (UNDERPADS AND DIAPERS) ×3 IMPLANT
WARMER LAPAROSCOPE (MISCELLANEOUS) ×3 IMPLANT
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
## 2021-02-04 NOTE — Anesthesia Procedure Notes (Signed)
Procedure Name: Intubation Date/Time: 02/04/2021 7:42 AM Performed by: Dorthea Cove, CRNA Pre-anesthesia Checklist: Patient identified, Emergency Drugs available, Suction available and Patient being monitored Patient Re-evaluated:Patient Re-evaluated prior to induction Oxygen Delivery Method: Circle system utilized Preoxygenation: Pre-oxygenation with 100% oxygen Induction Type: IV induction Ventilation: Mask ventilation without difficulty Laryngoscope Size: Mac and 3 Grade View: Grade II Tube type: Oral Tube size: 7.0 mm Number of attempts: 1 Airway Equipment and Method: Stylet and Oral airway Placement Confirmation: ETT inserted through vocal cords under direct vision,  positive ETCO2 and breath sounds checked- equal and bilateral Secured at: 22 cm Tube secured with: Tape Dental Injury: Teeth and Oropharynx as per pre-operative assessment

## 2021-02-04 NOTE — Progress Notes (Signed)
Notified MD med rec needs done so I can print out AVS . Incoming nurse aware

## 2021-02-04 NOTE — Anesthesia Postprocedure Evaluation (Signed)
Anesthesia Post Note  Patient: Caroline Gonzales  Procedure(s) Performed: TOTAL LAPAROSCOPIC HYSTERECTOMY WITH SALPINGECTOMY (Bilateral ) CYSTOSCOPY (N/A )     Patient location during evaluation: PACU Anesthesia Type: General Level of consciousness: awake and alert Pain management: pain level controlled Vital Signs Assessment: post-procedure vital signs reviewed and stable Respiratory status: spontaneous breathing, nonlabored ventilation and respiratory function stable Cardiovascular status: blood pressure returned to baseline and stable Postop Assessment: no apparent nausea or vomiting Anesthetic complications: no   No complications documented.  Last Vitals:  Vitals:   02/04/21 1200 02/04/21 1234  BP: 114/83 106/68  Pulse: 65 64  Resp: (!) 6 18  Temp: (!) 36.1 C 36.9 C  SpO2: 95% 95%    Last Pain:  Vitals:   02/04/21 1234  TempSrc:   PainSc: 5                  Lidia Collum
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
## 2021-02-04 NOTE — Plan of Care (Addendum)
  Problem: Education: Goal: Knowledge of General Education information will improve Description: Including pain rating scale, medication(s)/side effects and non-pharmacologic comfort measures Outcome: Progressing  Blood pressure 128/74, pulse 73, temperature 97.8 F (36.6 C), temperature source Oral, resp. rate 18, height 5\' 6"  (1.676 m), weight 129.3 kg, last menstrual period 01/06/2021, SpO2 95 %. Pt alert and oriented, pain managed with oxycodene and tylenol, Pt educated on ambulation, fluid in take  And use if incentive spirometre. Encouraged to voice any complains or increased symptoms including bleeding, nausea pain and emotional distress . Emotional support provided to the pt. Husband at bedside

## 2021-02-04 NOTE — Anesthesia Preprocedure Evaluation (Signed)
Anesthesia Evaluation  Patient identified by MRN, date of birth, ID band Patient awake    Reviewed: Allergy & Precautions, NPO status , Patient's Chart, lab work & pertinent test results, reviewed documented beta blocker date and time   History of Anesthesia Complications (+) PONV and history of anesthetic complications  Airway Mallampati: II  TM Distance: >3 FB Neck ROM: Full    Dental  (+) Teeth Intact   Pulmonary sleep apnea , former smoker,    Pulmonary exam normal        Cardiovascular Pt. on home beta blockers Normal cardiovascular exam     Neuro/Psych Anxiety Depression negative neurological ROS     GI/Hepatic Neg liver ROS, GERD  ,  Endo/Other  Morbid obesity  Renal/GU negative Renal ROS  negative genitourinary   Musculoskeletal negative musculoskeletal ROS (+)   Abdominal   Peds  Hematology  (+) anemia , Hgb 11.8   Anesthesia Other Findings   Reproductive/Obstetrics negative OB ROS                             Anesthesia Physical Anesthesia Plan  ASA: III  Anesthesia Plan: General   Post-op Pain Management:    Induction: Intravenous  PONV Risk Score and Plan: 4 or greater and Ondansetron, Dexamethasone, Treatment may vary due to age or medical condition, Midazolam, Scopolamine patch - Pre-op, TIVA and Propofol infusion  Airway Management Planned: Oral ETT  Additional Equipment: None  Intra-op Plan:   Post-operative Plan: Extubation in OR  Informed Consent: I have reviewed the patients History and Physical, chart, labs and discussed the procedure including the risks, benefits and alternatives for the proposed anesthesia with the patient or authorized representative who has indicated his/her understanding and acceptance.     Dental advisory given  Plan Discussed with:   Anesthesia Plan Comments:         Anesthesia Quick Evaluation
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
## 2021-02-04 NOTE — Progress Notes (Signed)
Patient arrived from PACU via bed. VSS. NAD. Placed on 2L [place]. Bed is in the lowest and locked position with bed rails up times 3. Belongings and call bed within reach. Patient contacted spouse via cell phone. Cell phone, glasses, clothing at bedside. Patient due to void by 1715. Patient is aware of care plan and mobility expectation. Will hand off to primary RN.

## 2021-02-04 NOTE — Transfer of Care (Signed)
Immediate Anesthesia Transfer of Care Note  Patient: Caroline Gonzales  Procedure(s) Performed: TOTAL LAPAROSCOPIC HYSTERECTOMY WITH SALPINGECTOMY (Bilateral ) CYSTOSCOPY (N/A )  Patient Location: PACU  Anesthesia Type:General  Level of Consciousness: awake, alert  and oriented  Airway & Oxygen Therapy: Patient Spontanous Breathing and Patient connected to face mask oxygen  Post-op Assessment: Report given to RN and Post -op Vital signs reviewed and stable  Post vital signs: Reviewed and stable  Last Vitals:  Vitals Value Taken Time  BP 108/87 02/04/21 1114  Temp    Pulse 76 02/04/21 1117  Resp 14 02/04/21 1117  SpO2 95 % 02/04/21 1117  Vitals shown include unvalidated device data.  Last Pain:  Vitals:   02/04/21 0556  TempSrc:   PainSc: 0-No pain         Complications: No complications documented.

## 2021-02-05 ENCOUNTER — Other Ambulatory Visit (HOSPITAL_COMMUNITY): Payer: Self-pay

## 2021-02-05 ENCOUNTER — Encounter (HOSPITAL_COMMUNITY): Payer: Self-pay | Admitting: Obstetrics and Gynecology

## 2021-02-06 ENCOUNTER — Encounter: Payer: Self-pay | Admitting: Obstetrics and Gynecology

## 2021-02-06 ENCOUNTER — Telehealth: Payer: Self-pay | Admitting: Obstetrics and Gynecology

## 2021-02-06 ENCOUNTER — Telehealth (HOSPITAL_COMMUNITY): Payer: Self-pay

## 2021-02-06 LAB — SURGICAL PATHOLOGY

## 2021-02-06 NOTE — Telephone Encounter (Signed)
Dr.Jertson I didn't know if you had any other recommendations as well.

## 2021-02-06 NOTE — Telephone Encounter (Signed)
Called pt to discuss scheduling for [facility] therapy two weeks post op. Pt states that she will be changing jobs soon and is not sure if she'll be able to commit time to completing 9 weeks of [place]. Pt will call back once she has more information regarding her work hours and schedule to determine if she will continue with [facility].

## 2021-02-06 NOTE — Telephone Encounter (Signed)
mychart message sent

## 2021-02-10 MED FILL — Fluvoxamine Maleate Tab 100 MG: ORAL | 90 days supply | Qty: 90 | Fill #0 | Status: AC

## 2021-02-10 MED FILL — Clonazepam Tab 0.5 MG: ORAL | 30 days supply | Qty: 120 | Fill #1 | Status: AC

## 2021-02-11 ENCOUNTER — Other Ambulatory Visit (HOSPITAL_COMMUNITY): Payer: Self-pay

## 2021-02-12 DIAGNOSIS — F4310 Post-traumatic stress disorder, unspecified: Secondary | ICD-10-CM | POA: Diagnosis not present

## 2021-02-13 ENCOUNTER — Encounter: Payer: Self-pay | Admitting: Obstetrics and Gynecology

## 2021-02-13 ENCOUNTER — Ambulatory Visit: Payer: 59 | Admitting: Obstetrics and Gynecology

## 2021-02-18 ENCOUNTER — Other Ambulatory Visit: Payer: Self-pay | Admitting: Physician Assistant

## 2021-02-19 ENCOUNTER — Other Ambulatory Visit (HOSPITAL_COMMUNITY): Payer: Self-pay

## 2021-02-19 ENCOUNTER — Other Ambulatory Visit: Payer: Self-pay | Admitting: Physician Assistant

## 2021-02-19 DIAGNOSIS — F4310 Post-traumatic stress disorder, unspecified: Secondary | ICD-10-CM | POA: Diagnosis not present

## 2021-02-19 MED ORDER — GABAPENTIN 300 MG PO CAPS
ORAL_CAPSULE | ORAL | 1 refills | Status: DC
  Filled 2021-02-19: qty 180, 90d supply, fill #0

## 2021-02-19 MED FILL — Quetiapine Fumarate Tab 300 MG: ORAL | 90 days supply | Qty: 90 | Fill #0 | Status: AC

## 2021-02-20 ENCOUNTER — Other Ambulatory Visit (HOSPITAL_COMMUNITY): Payer: Self-pay

## 2021-02-21 ENCOUNTER — Telehealth: Payer: Self-pay | Admitting: Physician Assistant

## 2021-02-21 ENCOUNTER — Other Ambulatory Visit (HOSPITAL_COMMUNITY): Payer: Self-pay

## 2021-02-21 ENCOUNTER — Other Ambulatory Visit: Payer: Self-pay

## 2021-02-21 ENCOUNTER — Other Ambulatory Visit: Payer: Self-pay | Admitting: Physician Assistant

## 2021-02-21 ENCOUNTER — Encounter: Payer: Self-pay | Admitting: Obstetrics and Gynecology

## 2021-02-21 ENCOUNTER — Ambulatory Visit (INDEPENDENT_AMBULATORY_CARE_PROVIDER_SITE_OTHER): Payer: 59 | Admitting: Obstetrics and Gynecology

## 2021-02-21 VITALS — BP 130/76 | HR 90 | Ht 65.0 in | Wt 291.8 lb

## 2021-02-21 DIAGNOSIS — Z90710 Acquired absence of both cervix and uterus: Secondary | ICD-10-CM

## 2021-02-21 MED ORDER — GABAPENTIN 300 MG PO CAPS
600.0000 mg | ORAL_CAPSULE | Freq: Every day | ORAL | 0 refills | Status: DC
  Filled 2021-02-21: qty 270, 90d supply, fill #0

## 2021-02-21 MED ORDER — GABAPENTIN 300 MG PO CAPS
600.0000 mg | ORAL_CAPSULE | Freq: Every day | ORAL | 0 refills | Status: DC
Start: 2021-02-21 — End: 2021-02-21
  Filled 2021-02-21: qty 180, 90d supply, fill #0

## 2021-02-21 NOTE — Telephone Encounter (Signed)
Rx was already sent

## 2021-02-21 NOTE — Telephone Encounter (Signed)
Spoke to pharmacy.Sent new Rx

## 2021-02-21 NOTE — Telephone Encounter (Signed)
reviewed

## 2021-02-21 NOTE — Telephone Encounter (Signed)
Pt LM on VM pharmacy needs approval to fill Neurontin @ [facility]. Had refill just need to approve RF.  Pt  # [phone]

## 2021-02-21 NOTE — Telephone Encounter (Signed)
Please review

## 2021-02-21 NOTE — Progress Notes (Signed)
GYNECOLOGY  VISIT   HPI: 45 y.o.   Married White or Caucasian Not Hispanic or Latino  female   [phone] with No LMP recorded.   here 2.5 weeks post op s/p laparoscopic hysterectomy/BS/LOA/cystoscopy. Pathology was benign.   Pain is well controlled, only taking ibuprofen at night. No bleeding. Her mental health is so much better without the bleeding.  No bowel or bladder c/o.   GYNECOLOGIC HISTORY: No LMP recorded. Contraception:hysterectomy  Menopausal hormone therapy: none        OB History     Gravida  3   Para  1   Term  1   Preterm  0   AB  2   Living  1      SAB  0   IAB  2   Ectopic  0   Multiple  0   Live Births  1              Patient Active Problem List   Diagnosis Date Noted   Status post laparoscopic hysterectomy 02/04/2021   S/P laparoscopic hysterectomy 02/04/2021   PTSD (post-traumatic stress disorder) 07/21/2018   GAD (generalized anxiety disorder) 07/21/2018   Insomnia 07/21/2018   MDD (major depressive disorder) 07/21/2018   Nightmares 07/21/2018   Insulin resistance 10/21/2017   Other hyperlipidemia 10/07/2017   Vitamin D deficiency 10/07/2017   Class 3 obesity with serious comorbidity and body mass index (BMI) of 40.0 to 44.9 in adult 04/27/2017   Residual foreign body in soft tissue 11/12/2016   Prediabetes 11/02/2016   Morbid obesity ([place]) 10/15/2016    Past Medical History:  Diagnosis Date   Allergy    Zyrtec, Allegra.   Anxiety    followed by Dr. Toy Cookey   Back pain    Complication of anesthesia    Depression    Fibroid    Gastroparesis 09/14/2012   gastric emptying study in 2014   GERD (gastroesophageal reflux disease)    Headache    Pneumonia    2013ish   PONV (postoperative nausea and vomiting)     likes scopolamine patch and zofran /phenergan helps   Pre-diabetes    PTSD (post-traumatic stress disorder)    PTSD (post-traumatic stress disorder)    Sleep apnea 07/01/2018   cpap optional, pt close to not use cpap    Vitamin D deficiency    Wears glasses    Wears glasses     Past Surgical History:  Procedure Laterality Date   ANKLE ARTHROSCOPY Left 2011   CESAREAN SECTION  06/2006   x 1   CHOLECYSTECTOMY  07/2006   laparoscopic   CYSTOSCOPY N/A 02/04/2021   Procedure: CYSTOSCOPY;  Surgeon: Salvadore Dom, MD;  Location: [facility];  Service: Gynecology;  Laterality: N/A;   DILATION AND CURETTAGE OF UTERUS  1997   x 2   FOREIGN BODY REMOVAL Left 11/18/2016   Procedure: REMOVAL FOREIGN BODY EXTREMITY LEFT FOOT;  Surgeon: Trula Slade, DPM;  Location: [facility];  Service: Podiatry;  Laterality: Left;   PILONIDAL CYST EXCISION  1990's   RADIOLOGY WITH ANESTHESIA N/A 11/10/2018   Procedure: MRI WITH ANESTHESIA OF BRAIN AND ORBITS WITH AND WITHOUT CONTRAST;  Surgeon: Radiologist, Medication, MD;  Location: [facility];  Service: Radiology;  Laterality: N/A;   TENDON REPAIR Left 2011   Left Ankle   TOTAL LAPAROSCOPIC HYSTERECTOMY WITH SALPINGECTOMY Bilateral 02/04/2021   Procedure: TOTAL LAPAROSCOPIC HYSTERECTOMY WITH SALPINGECTOMY;  Surgeon: Salvadore Dom, MD;  Location: [facility];  Service:  Gynecology;  Laterality: Bilateral;   WISDOM TOOTH EXTRACTION  1990's    Current Outpatient Medications  Medication Sig Dispense Refill   acetaminophen (TYLENOL) 500 MG tablet Take 2 tablets (1,000 mg total) by mouth every 6 (six) hours. 30 tablet 0   Carboxymethylcellul-Glycerin (LUBRICATING EYE DROPS OP) Place 1 drop into both eyes daily as needed (dryness/ allergies).     cetirizine (ZYRTEC) 10 MG tablet Take 10 mg by mouth at bedtime.     Cholecalciferol (VITAMIN D3) 5000 units CAPS Take 5,000 Units by mouth at bedtime.     clonazePAM (KLONOPIN) 0.5 MG tablet TAKE 1 TABLET (0.5 MG TOTAL) BY MOUTH EVERY 6 (SIX) HOURS AS NEEDED FOR ANXIETY (Patient taking differently: Take 0.5-1.5 mg by mouth See admin instructions. Take 0.5 mg in the morning and 1.5 mg at bedtime) 120 tablet 5   docusate sodium (COLACE) 100 MG  capsule Take 1 capsule (100 mg total) by mouth 2 (two) times daily. 10 capsule 0   esomeprazole (NEXIUM) 20 MG capsule Take 20 mg by mouth at bedtime.     fluvoxaMINE (LUVOX) 100 MG tablet TAKE 1 TABLET (100 MG TOTAL) BY MOUTH AT BEDTIME. (Patient taking differently: Take 100 mg by mouth at bedtime. Take with 50 mg dose to equal 150 mg at bedtime) 90 tablet 1   fluvoxaMINE (LUVOX) 50 MG tablet TAKE 1 TABLET (50 MG TOTAL) BY MOUTH AT BEDTIME. TAKE IN ADDITION TO THE 100 MG PILL TO EQUAL 150 MG (Patient taking differently: Take 50 mg by mouth at bedtime. Take with 100 mg dose to equal 150 mg at bedtime) 90 tablet 1   gabapentin (NEURONTIN) 300 MG capsule Take 2 capsules (600 mg total) by mouth at bedtime. 180 capsule 0   ibuprofen (ADVIL) 800 MG tablet Take 1 tablet (800 mg total) by mouth every 8 (eight) hours as needed. 30 tablet 0   oxyCODONE (OXY IR/ROXICODONE) 5 MG immediate release tablet Take 1-2 tablets (5-10 mg total) by mouth every 4 (four) hours as needed for moderate pain. 30 tablet 0   prazosin (MINIPRESS) 2 MG capsule Take 4 capsules (8 mg total) by mouth at bedtime. 360 capsule 1   propranolol (INDERAL) 40 MG tablet TAKE 1 TABLET (40 MG TOTAL) BY MOUTH 2 (TWO) TIMES DAILY. (Patient taking differently: Take 40 mg by mouth 2 (two) times daily.) 180 tablet 3   QUEtiapine (SEROQUEL) 200 MG tablet Take 0.5-1 tablets (100-200 mg total) by mouth at bedtime as directed. (Patient taking differently: Take 100-200 mg by mouth at bedtime. Take with 300 mg dose to equal either 400 mg or 500 mg at bedtime) 90 tablet 1   QUEtiapine (SEROQUEL) 300 MG tablet Take 1 tablet (300 mg total) by mouth at bedtime. 90 tablet 1   QUEtiapine (SEROQUEL) 300 MG tablet Take 300 mg by mouth at bedtime.     triamcinolone (NASACORT) 55 MCG/ACT AERO nasal inhaler Place 1 spray into the nose at bedtime.     No current facility-administered medications for this visit.     ALLERGIES: Penicillins, Tamiflu [oseltamivir  phosphate], Cephalosporins, and Latex  Family History  Problem Relation Age of Onset   Cancer Mother        squamous cell carcinoma   Hyperlipidemia Mother    Depression Mother    Anxiety disorder Mother    Obesity Mother    Heart disease Father 65       cardiomegaly, CHF; steroid use.   Hyperlipidemia Father    Hypertension Father  Mental retardation Father    High blood pressure Father    Depression Father    Anxiety disorder Father    Obesity Father    Cancer Maternal Grandmother    Diabetes Maternal Grandmother    Heart disease Maternal Grandmother    Hyperlipidemia Maternal Grandmother    Hypertension Maternal Grandmother    Stroke Maternal Grandmother    Heart disease Paternal Grandmother    Hypertension Paternal Grandmother    Heart disease Paternal Grandfather    Hyperlipidemia Paternal Grandfather    Mental illness Paternal Grandfather    Rheum arthritis Sister    Multiple sclerosis Sister    Breast cancer Maternal Aunt    Thyroid cancer Paternal Aunt     Social History   Socioeconomic History   Marital status: Married    Spouse name: Not on file   Number of children: 2   Years of education: Not on file   Highest education level: Bachelor's degree (e.g., BA, AB, BS)  Occupational History   Occupation: Programmer, multimedia: [employer]  Tobacco Use   Smoking status: Former    Packs/day: 2.00    Years: 10.00    Pack years: 20.00    Types: Cigarettes    Quit date: 09/14/2000    Years since quitting: 20.4   Smokeless tobacco: Never  Vaping Use   Vaping Use: Never used  Substance and Sexual Activity   Alcohol use: Yes    Alcohol/week: 0.0 standard drinks    Comment: every  6 months   Drug use: No   Sexual activity: Yes    Partners: Male    Birth control/protection: I.U.D.    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      Seatbelt: 100%; no texting while driving often       Social Determinants of Radio broadcast assistant Strain: Not on file  Food Insecurity: Not on file  Transportation Needs: Not on file  Physical Activity: Not on file  Stress: Not on file  Social Connections: Not on file  Intimate Partner Violence: Not on file    Review of Systems  All other systems reviewed and are negative.  PHYSICAL EXAMINATION:    There were no vitals taken for this visit.    General appearance: alert, cooperative and appears stated age Abdomen: soft, non-tender; non distended, no masses,  no organomegaly. Incisions: healing well  1. Status post laparoscopic hysterectomy Doing well, routine f/u

## 2021-02-21 NOTE — Telephone Encounter (Signed)
Next visit is 04/29/21. [facility] called and said that Caroline Gonzales takes Gabapentin by mouth 2 at bedtime but occasionally takes 3 if needed. Pharmacy asks if it can be approved for 3 at bedtime instead? [phone]

## 2021-02-26 DIAGNOSIS — F4310 Post-traumatic stress disorder, unspecified: Secondary | ICD-10-CM | POA: Diagnosis not present

## 2021-03-02 MED FILL — Fluvoxamine Maleate Tab 50 MG: ORAL | 90 days supply | Qty: 90 | Fill #0 | Status: AC

## 2021-03-03 ENCOUNTER — Other Ambulatory Visit (HOSPITAL_COMMUNITY): Payer: Self-pay

## 2021-03-04 ENCOUNTER — Ambulatory Visit (INDEPENDENT_AMBULATORY_CARE_PROVIDER_SITE_OTHER): Payer: 59 | Admitting: Obstetrics and Gynecology

## 2021-03-04 ENCOUNTER — Other Ambulatory Visit: Payer: Self-pay

## 2021-03-04 ENCOUNTER — Encounter: Payer: Self-pay | Admitting: Obstetrics and Gynecology

## 2021-03-04 VITALS — BP 124/74 | HR 94

## 2021-03-04 DIAGNOSIS — F4310 Post-traumatic stress disorder, unspecified: Secondary | ICD-10-CM

## 2021-03-04 DIAGNOSIS — Z90710 Acquired absence of both cervix and uterus: Secondary | ICD-10-CM

## 2021-03-04 NOTE — Progress Notes (Signed)
GYNECOLOGY  VISIT   HPI: 45 y.o.   Married White or Caucasian Not Hispanic or Latino  female   [phone] with Patient's last menstrual period was 01/06/2021.   here for  post op check. She is one month s/p total Laparoscopic Hysterectomy with bilateral salpingectomies, extensive lysis of adhesions and cystoscopy. Her PTSD is so much better without the vaginal bleeding. She is feeling good. Normal bowel and bladder function, no vaginal bleeding. Minimal pain.   GYNECOLOGIC HISTORY: Patient's last menstrual period was 01/06/2021. Contraception:Hyst Menopausal hormone therapy: none        OB History     Gravida  3   Para  1   Term  1   Preterm  0   AB  2   Living  1      SAB  0   IAB  2   Ectopic  0   Multiple  0   Live Births  1              Patient Active Problem List   Diagnosis Date Noted   Status post laparoscopic hysterectomy 02/04/2021   S/P laparoscopic hysterectomy 02/04/2021   PTSD (post-traumatic stress disorder) 07/21/2018   GAD (generalized anxiety disorder) 07/21/2018   Insomnia 07/21/2018   MDD (major depressive disorder) 07/21/2018   Nightmares 07/21/2018   Insulin resistance 10/21/2017   Other hyperlipidemia 10/07/2017   Vitamin D deficiency 10/07/2017   Class 3 obesity with serious comorbidity and body mass index (BMI) of 40.0 to 44.9 in adult 04/27/2017   Residual foreign body in soft tissue 11/12/2016   Prediabetes 11/02/2016   Morbid obesity ([employer]) 10/15/2016    Past Medical History:  Diagnosis Date   Allergy    Zyrtec, Allegra.   Anxiety    followed by Dr. Toy Cookey   Back pain    Complication of anesthesia    Depression    Fibroid    Gastroparesis 09/14/2012   gastric emptying study in 2014   GERD (gastroesophageal reflux disease)    Headache    Pneumonia    2013ish   PONV (postoperative nausea and vomiting)     likes scopolamine patch and zofran /phenergan helps   Pre-diabetes    PTSD (post-traumatic stress disorder)     PTSD (post-traumatic stress disorder)    Sleep apnea 07/01/2018   cpap optional, pt close to not use cpap   Vitamin D deficiency    Wears glasses    Wears glasses     Past Surgical History:  Procedure Laterality Date   ANKLE ARTHROSCOPY Left 2011   CESAREAN SECTION  06/2006   x 1   CHOLECYSTECTOMY  07/2006   laparoscopic   CYSTOSCOPY N/A 02/04/2021   Procedure: CYSTOSCOPY;  Surgeon: Salvadore Dom, MD;  Location: [facility];  Service: Gynecology;  Laterality: N/A;   DILATION AND CURETTAGE OF UTERUS  1997   x 2   FOREIGN BODY REMOVAL Left 11/18/2016   Procedure: REMOVAL FOREIGN BODY EXTREMITY LEFT FOOT;  Surgeon: Trula Slade, DPM;  Location: [facility];  Service: Podiatry;  Laterality: Left;   PILONIDAL CYST EXCISION  1990's   RADIOLOGY WITH ANESTHESIA N/A 11/10/2018   Procedure: MRI WITH ANESTHESIA OF BRAIN AND ORBITS WITH AND WITHOUT CONTRAST;  Surgeon: Radiologist, Medication, MD;  Location: [facility];  Service: Radiology;  Laterality: N/A;   TENDON REPAIR Left 2011   Left Ankle   TOTAL LAPAROSCOPIC HYSTERECTOMY WITH SALPINGECTOMY Bilateral 02/04/2021   Procedure: TOTAL LAPAROSCOPIC HYSTERECTOMY WITH SALPINGECTOMY;  Surgeon: Salvadore Dom, MD;  Location: [facility];  Service: Gynecology;  Laterality: Bilateral;   WISDOM TOOTH EXTRACTION  1990's    Current Outpatient Medications  Medication Sig Dispense Refill   acetaminophen (TYLENOL) 500 MG tablet Take 2 tablets (1,000 mg total) by mouth every 6 (six) hours. 30 tablet 0   Carboxymethylcellul-Glycerin (LUBRICATING EYE DROPS OP) Place 1 drop into both eyes daily as needed (dryness/ allergies).     cetirizine (ZYRTEC) 10 MG tablet Take 10 mg by mouth at bedtime.     Cholecalciferol (VITAMIN D3) 5000 units CAPS Take 5,000 Units by mouth at bedtime.     clonazePAM (KLONOPIN) 0.5 MG tablet TAKE 1 TABLET (0.5 MG TOTAL) BY MOUTH EVERY 6 (SIX) HOURS AS NEEDED FOR ANXIETY (Patient taking differently: Take 0.5-1.5 mg by mouth See admin  instructions. Take 0.5 mg in the morning and 1.5 mg at bedtime) 120 tablet 5   docusate sodium (COLACE) 100 MG capsule Take 1 capsule (100 mg total) by mouth 2 (two) times daily. 10 capsule 0   esomeprazole (NEXIUM) 20 MG capsule Take 20 mg by mouth at bedtime.     fluvoxaMINE (LUVOX) 100 MG tablet TAKE 1 TABLET (100 MG TOTAL) BY MOUTH AT BEDTIME. (Patient taking differently: Take 100 mg by mouth at bedtime. Take with 50 mg dose to equal 150 mg at bedtime) 90 tablet 1   fluvoxaMINE (LUVOX) 50 MG tablet TAKE 1 TABLET (50 MG TOTAL) BY MOUTH AT BEDTIME. TAKE IN ADDITION TO THE 100 MG PILL TO EQUAL 150 MG (Patient taking differently: Take 50 mg by mouth at bedtime. Take with 100 mg dose to equal 150 mg at bedtime) 90 tablet 1   gabapentin (NEURONTIN) 300 MG capsule Take 2-3 capsules (600-900 mg total) by mouth at bedtime. 270 capsule 0   ibuprofen (ADVIL) 800 MG tablet Take 1 tablet (800 mg total) by mouth every 8 (eight) hours as needed. 30 tablet 0   prazosin (MINIPRESS) 2 MG capsule Take 4 capsules (8 mg total) by mouth at bedtime. 360 capsule 1   propranolol (INDERAL) 40 MG tablet TAKE 1 TABLET (40 MG TOTAL) BY MOUTH 2 (TWO) TIMES DAILY. (Patient taking differently: Take 40 mg by mouth 2 (two) times daily.) 180 tablet 3   QUEtiapine (SEROQUEL) 200 MG tablet Take 0.5-1 tablets (100-200 mg total) by mouth at bedtime as directed. (Patient taking differently: Take 100-200 mg by mouth at bedtime. Take with 300 mg dose to equal either 400 mg or 500 mg at bedtime) 90 tablet 1   QUEtiapine (SEROQUEL) 300 MG tablet Take 1 tablet (300 mg total) by mouth at bedtime. 90 tablet 1   triamcinolone (NASACORT) 55 MCG/ACT AERO nasal inhaler Place 1 spray into the nose at bedtime.     No current facility-administered medications for this visit.     ALLERGIES: Penicillins, Tamiflu [oseltamivir phosphate], Cephalosporins, and Latex  Family History  Problem Relation Age of Onset   Cancer Mother        squamous cell  carcinoma   Hyperlipidemia Mother    Depression Mother    Anxiety disorder Mother    Obesity Mother    Heart disease Father 10       cardiomegaly, CHF; steroid use.   Hyperlipidemia Father    Hypertension Father    Mental retardation Father    High blood pressure Father    Depression Father    Anxiety disorder Father    Obesity Father    Cancer Maternal Grandmother  Diabetes Maternal Grandmother    Heart disease Maternal Grandmother    Hyperlipidemia Maternal Grandmother    Hypertension Maternal Grandmother    Stroke Maternal Grandmother    Heart disease Paternal Grandmother    Hypertension Paternal Grandmother    Heart disease Paternal Grandfather    Hyperlipidemia Paternal Grandfather    Mental illness Paternal Grandfather    Rheum arthritis Sister    Multiple sclerosis Sister    Breast cancer Maternal Aunt    Thyroid cancer Paternal Aunt     Social History   Socioeconomic History   Marital status: Married    Spouse name: Not on file   Number of children: 2   Years of education: Not on file   Highest education level: Bachelor's degree (e.g., BA, AB, BS)  Occupational History   Occupation: Programmer, multimedia: [employer]  Tobacco Use   Smoking status: Former    Packs/day: 2.00    Years: 10.00    Pack years: 20.00    Types: Cigarettes    Quit date: 09/14/2000    Years since quitting: 20.4   Smokeless tobacco: Never  Vaping Use   Vaping Use: Never used  Substance and Sexual Activity   Alcohol use: Yes    Alcohol/week: 0.0 standard drinks    Comment: every  6 months   Drug use: No   Sexual activity: Yes    Partners: Male    Birth control/protection: I.U.D.    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      Seatbelt: 100%; no texting while driving often       Social Determinants of Radio broadcast assistant Strain: Not on file  Food Insecurity: Not on file  Transportation Needs: Not on file  Physical Activity: Not on file  Stress: Not on file  Social Connections: Not on file  Intimate Partner Violence: Not on file    ROS  PHYSICAL EXAMINATION:    BP 124/74 (BP Location: Right Arm, Patient Position: Sitting, Cuff Size: Large)   Pulse 94   LMP 01/06/2021   SpO2 98%     General appearance: alert, cooperative and appears stated age Abdomen: soft, non-tender; non distended, no masses,  no organomegaly Incisions: well healed  Pelvic: External genitalia:  no lesions              Urethra:  normal appearing urethra with no masses, tenderness or lesions              Bartholins and Skenes: normal                 Vagina: normal appearing vagina with normal color and discharge, no lesions  Vaginal cuff: healing well, not tender. Minimal brown d/c noted at vaginal apex.               Cervix: absent              Bimanual Exam:  Uterus:  uterus absent  Adnexa: no mass, fullness, tenderness               Chaperone, Gae Dry, was present for exam.  1. Status post laparoscopic hysterectomy Doing well F/U for annual exam next spring Continue pelvic rest until 12 weeks post op  2. PTSD (post-traumatic stress disorder) Doing much better!

## 2021-03-05 DIAGNOSIS — F4310 Post-traumatic stress disorder, unspecified: Secondary | ICD-10-CM | POA: Diagnosis not present

## 2021-03-10 DIAGNOSIS — F4310 Post-traumatic stress disorder, unspecified: Secondary | ICD-10-CM | POA: Diagnosis not present

## 2021-03-17 DIAGNOSIS — F4310 Post-traumatic stress disorder, unspecified: Secondary | ICD-10-CM | POA: Diagnosis not present

## 2021-03-25 ENCOUNTER — Other Ambulatory Visit (HOSPITAL_COMMUNITY): Payer: Self-pay

## 2021-03-25 ENCOUNTER — Other Ambulatory Visit: Payer: Self-pay | Admitting: Physician Assistant

## 2021-03-25 MED FILL — Clonazepam Tab 0.5 MG: ORAL | 30 days supply | Qty: 120 | Fill #2 | Status: AC

## 2021-03-26 ENCOUNTER — Other Ambulatory Visit (HOSPITAL_COMMUNITY): Payer: Self-pay

## 2021-03-26 ENCOUNTER — Other Ambulatory Visit: Payer: Self-pay | Admitting: Physician Assistant

## 2021-03-27 ENCOUNTER — Other Ambulatory Visit (HOSPITAL_COMMUNITY): Payer: Self-pay

## 2021-03-27 DIAGNOSIS — S9031XA Contusion of right foot, initial encounter: Secondary | ICD-10-CM | POA: Diagnosis not present

## 2021-03-27 MED ORDER — GABAPENTIN 300 MG PO CAPS
600.0000 mg | ORAL_CAPSULE | Freq: Every day | ORAL | 0 refills | Status: DC
  Filled 2021-03-27 – 2021-06-04 (×2): qty 270, 90d supply, fill #0

## 2021-04-04 DIAGNOSIS — F4310 Post-traumatic stress disorder, unspecified: Secondary | ICD-10-CM | POA: Diagnosis not present

## 2021-04-10 ENCOUNTER — Encounter: Payer: Self-pay | Admitting: Obstetrics and Gynecology

## 2021-04-10 DIAGNOSIS — F4310 Post-traumatic stress disorder, unspecified: Secondary | ICD-10-CM | POA: Diagnosis not present

## 2021-04-16 DIAGNOSIS — F4310 Post-traumatic stress disorder, unspecified: Secondary | ICD-10-CM | POA: Diagnosis not present

## 2021-04-23 DIAGNOSIS — F4310 Post-traumatic stress disorder, unspecified: Secondary | ICD-10-CM | POA: Diagnosis not present

## 2021-04-28 ENCOUNTER — Other Ambulatory Visit: Payer: Self-pay | Admitting: Physician Assistant

## 2021-04-29 ENCOUNTER — Ambulatory Visit: Payer: 59 | Admitting: Physician Assistant

## 2021-04-29 ENCOUNTER — Other Ambulatory Visit (HOSPITAL_COMMUNITY): Payer: Self-pay

## 2021-04-29 ENCOUNTER — Other Ambulatory Visit: Payer: Self-pay | Admitting: Physician Assistant

## 2021-04-29 NOTE — Telephone Encounter (Signed)
Last filled 7/14 appt on 8/23

## 2021-04-30 ENCOUNTER — Other Ambulatory Visit (HOSPITAL_COMMUNITY): Payer: Self-pay

## 2021-04-30 ENCOUNTER — Other Ambulatory Visit: Payer: Self-pay | Admitting: Physician Assistant

## 2021-05-01 ENCOUNTER — Other Ambulatory Visit (HOSPITAL_COMMUNITY): Payer: Self-pay

## 2021-05-01 MED ORDER — CLONAZEPAM 0.5 MG PO TABS
0.5000 mg | ORAL_TABLET | Freq: Four times a day (QID) | ORAL | 5 refills | Status: DC | PRN
  Filled 2021-05-01: qty 120, 30d supply, fill #0
  Filled 2021-06-04: qty 120, 30d supply, fill #1
  Filled 2021-07-10: qty 120, 30d supply, fill #0
  Filled 2021-08-17: qty 120, 30d supply, fill #1
  Filled 2021-09-30: qty 120, 30d supply, fill #2

## 2021-05-01 NOTE — Telephone Encounter (Signed)
Pt has called Im not sure if you saw the other refill request

## 2021-05-01 NOTE — Telephone Encounter (Signed)
Pt called checking on the status of her refill. She will be out tonight

## 2021-05-02 ENCOUNTER — Encounter: Payer: Self-pay | Admitting: Emergency Medicine

## 2021-05-02 ENCOUNTER — Ambulatory Visit
Admission: EM | Admit: 2021-05-02 | Discharge: 2021-05-02 | Disposition: A | Discharge status: Home / Self Care | Payer: 59 | Attending: Urgent Care | Admitting: Urgent Care

## 2021-05-02 ENCOUNTER — Ambulatory Visit (INDEPENDENT_AMBULATORY_CARE_PROVIDER_SITE_OTHER): Payer: 59

## 2021-05-02 ENCOUNTER — Other Ambulatory Visit: Payer: Self-pay

## 2021-05-02 ENCOUNTER — Other Ambulatory Visit (HOSPITAL_COMMUNITY): Payer: Self-pay

## 2021-05-02 DIAGNOSIS — M25572 Pain in left ankle and joints of left foot: Secondary | ICD-10-CM

## 2021-05-02 DIAGNOSIS — M25472 Effusion, left ankle: Secondary | ICD-10-CM

## 2021-05-02 DIAGNOSIS — M79672 Pain in left foot: Secondary | ICD-10-CM | POA: Diagnosis not present

## 2021-05-02 DIAGNOSIS — M7752 Other enthesopathy of left foot: Secondary | ICD-10-CM | POA: Diagnosis not present

## 2021-05-02 DIAGNOSIS — M7750 Other enthesopathy of unspecified foot: Secondary | ICD-10-CM | POA: Diagnosis not present

## 2021-05-02 DIAGNOSIS — M7989 Other specified soft tissue disorders: Secondary | ICD-10-CM | POA: Diagnosis not present

## 2021-05-02 MED ORDER — NAPROXEN 500 MG PO TABS: 500.0000 mg | ORAL_TABLET | Freq: Two times a day (BID) | ORAL | 0 refills | Status: DC

## 2021-05-02 NOTE — ED Triage Notes (Signed)
Left foot/ankle swelling without mechanical injury. Pain exacerbated with active ROM, decreased but still present with passive ROM. Mild swelling evident without bruising. No breaks in skin visible. Works as a Marine scientist at [employer], on her feet regularly.

## 2021-05-02 NOTE — ED Provider Notes (Signed)
Caroline Gonzales   MRN: [id number] DOB: December 15, 1975  Subjective:   Caroline Gonzales is a 45 y.o. female presenting for several day history of acute onset persistent and worsening left ankle pain/swelling of the foot and ankle.  Patient works as a Marine scientist and palliative care, does a lot of walking and standing.  Denies any particular fall, trauma, numbness or tingling, rashes, redness, hot sensation.  No history of gout.  No history of ankle injuries or fractures.  No current facility-administered medications for this encounter.  Current Outpatient Medications:    acetaminophen (TYLENOL) 500 MG tablet, Take 2 tablets (1,000 mg total) by mouth every 6 (six) hours., Disp: 30 tablet, Rfl: 0   Carboxymethylcellul-Glycerin (LUBRICATING EYE DROPS OP), Place 1 drop into both eyes daily as needed (dryness/ allergies)., Disp: , Rfl:    cetirizine (ZYRTEC) 10 MG tablet, Take 10 mg by mouth at bedtime., Disp: , Rfl:    Cholecalciferol (VITAMIN D3) 5000 units CAPS, Take 5,000 Units by mouth at bedtime., Disp: , Rfl:    clonazePAM (KLONOPIN) 0.5 MG tablet, Take 1 tablet (0.5 mg total) by mouth every 6 (six) hours as needed for anxiety., Disp: 120 tablet, Rfl: 5   docusate sodium (COLACE) 100 MG capsule, Take 1 capsule (100 mg total) by mouth 2 (two) times daily., Disp: 10 capsule, Rfl: 0   esomeprazole (NEXIUM) 20 MG capsule, Take 20 mg by mouth at bedtime., Disp: , Rfl:    fluvoxaMINE (LUVOX) 100 MG tablet, TAKE 1 TABLET (100 MG TOTAL) BY MOUTH AT BEDTIME. (Patient taking differently: Take 100 mg by mouth at bedtime. Take with 50 mg dose to equal 150 mg at bedtime), Disp: 90 tablet, Rfl: 1   fluvoxaMINE (LUVOX) 50 MG tablet, TAKE 1 TABLET (50 MG TOTAL) BY MOUTH AT BEDTIME. TAKE IN ADDITION TO THE 100 MG PILL TO EQUAL 150 MG (Patient taking differently: Take 50 mg by mouth at bedtime. Take with 100 mg dose to equal 150 mg at bedtime), Disp: 90 tablet, Rfl: 1   gabapentin (NEURONTIN) 300 MG capsule,  Take 2-3 capsules (600-900 mg total) by mouth at bedtime., Disp: 270 capsule, Rfl: 0   ibuprofen (ADVIL) 800 MG tablet, Take 1 tablet (800 mg total) by mouth every 8 (eight) hours as needed., Disp: 30 tablet, Rfl: 0   prazosin (MINIPRESS) 2 MG capsule, Take 4 capsules (8 mg total) by mouth at bedtime., Disp: 360 capsule, Rfl: 1   propranolol (INDERAL) 40 MG tablet, TAKE 1 TABLET (40 MG TOTAL) BY MOUTH 2 (TWO) TIMES DAILY. (Patient taking differently: Take 40 mg by mouth 2 (two) times daily.), Disp: 180 tablet, Rfl: 3   QUEtiapine (SEROQUEL) 200 MG tablet, Take 0.5-1 tablets (100-200 mg total) by mouth at bedtime as directed. (Patient taking differently: Take 100-200 mg by mouth at bedtime. Take with 300 mg dose to equal either 400 mg or 500 mg at bedtime), Disp: 90 tablet, Rfl: 1   QUEtiapine (SEROQUEL) 300 MG tablet, Take 1 tablet (300 mg total) by mouth at bedtime., Disp: 90 tablet, Rfl: 1   triamcinolone (NASACORT) 55 MCG/ACT AERO nasal inhaler, Place 1 spray into the nose at bedtime., Disp: , Rfl:    Allergies  Allergen Reactions   Penicillins Shortness Of Breath and Rash    Did it involve swelling of the face/tongue/throat, SOB, or low BP? Yes Did it involve sudden or severe rash/hives, skin peeling, or any reaction on the inside of your mouth or nose? Yes Did you need to  seek medical attention at a hospital or doctor's office? Yes When did it last happen?      childhood allergy If all above answers are "NO", may proceed with cephalosporin use.     Tamiflu [Oseltamivir Phosphate] Anaphylaxis   Cephalosporins Rash   Latex Rash    Past Medical History:  Diagnosis Date   Allergy    Zyrtec, Allegra.   Anxiety    followed by Dr. Toy Gonzales   Back pain    Complication of anesthesia    Depression    Fibroid    Gastroparesis 09/14/2012   gastric emptying study in 2014   GERD (gastroesophageal reflux disease)    Headache    Pneumonia    2013ish   PONV (postoperative nausea and vomiting)      likes scopolamine patch and zofran /phenergan helps   Pre-diabetes    PTSD (post-traumatic stress disorder)    PTSD (post-traumatic stress disorder)    Sleep apnea 07/01/2018   cpap optional, pt close to not use cpap   Vitamin D deficiency    Wears glasses    Wears glasses      Past Surgical History:  Procedure Laterality Date   ANKLE ARTHROSCOPY Left 2011   CESAREAN SECTION  06/2006   x 1   CHOLECYSTECTOMY  07/2006   laparoscopic   CYSTOSCOPY N/A 02/04/2021   Procedure: CYSTOSCOPY;  Surgeon: Caroline Dom, MD;  Location: [facility];  Service: Gynecology;  Laterality: N/A;   DILATION AND CURETTAGE OF UTERUS  1997   x 2   FOREIGN BODY REMOVAL Left 11/18/2016   Procedure: REMOVAL FOREIGN BODY EXTREMITY LEFT FOOT;  Surgeon: Caroline Gonzales, DPM;  Location: [facility];  Service: Podiatry;  Laterality: Left;   PILONIDAL CYST EXCISION  1990's   RADIOLOGY WITH ANESTHESIA N/A 11/10/2018   Procedure: MRI WITH ANESTHESIA OF BRAIN AND ORBITS WITH AND WITHOUT CONTRAST;  Surgeon: Radiologist, Medication, MD;  Location: [facility];  Service: Radiology;  Laterality: N/A;   TENDON REPAIR Left 2011   Left Ankle   TOTAL LAPAROSCOPIC HYSTERECTOMY WITH SALPINGECTOMY Bilateral 02/04/2021   Procedure: TOTAL LAPAROSCOPIC HYSTERECTOMY WITH SALPINGECTOMY;  Surgeon: Caroline Dom, MD;  Location: [facility];  Service: Gynecology;  Laterality: Bilateral;   WISDOM TOOTH EXTRACTION  42's    Family History  Problem Relation Age of Onset   Cancer Mother        squamous cell carcinoma   Hyperlipidemia Mother    Depression Mother    Anxiety disorder Mother    Obesity Mother    Gonzales disease Father 51       cardiomegaly, CHF; steroid use.   Hyperlipidemia Father    Hypertension Father    Mental retardation Father    High blood pressure Father    Depression Father    Anxiety disorder Father    Obesity Father    Cancer Maternal Grandmother    Diabetes Maternal Grandmother    Gonzales disease Maternal  Grandmother    Hyperlipidemia Maternal Grandmother    Hypertension Maternal Grandmother    Stroke Maternal Grandmother    Gonzales disease Paternal Grandmother    Hypertension Paternal Grandmother    Gonzales disease Paternal Grandfather    Hyperlipidemia Paternal Grandfather    Mental illness Paternal Grandfather    Rheum arthritis Sister    Multiple sclerosis Sister    Breast cancer Maternal Aunt    Thyroid cancer Paternal Aunt     Social History   Tobacco Use   Smoking  status: Former    Packs/day: 2.00    Years: 10.00    Pack years: 20.00    Types: Cigarettes    Quit date: 09/14/2000    Years since quitting: 20.6   Smokeless tobacco: Never  Vaping Use   Vaping Use: Never used  Substance Use Topics   Alcohol use: Yes    Alcohol/week: 0.0 standard drinks    Comment: every  6 months   Drug use: No    ROS   Objective:   Vitals: BP 107/64 (BP Location: Left Arm)   Pulse 96   Temp 98.1 F (36.7 C) (Oral)   Resp 16   LMP 01/06/2021   SpO2 95%   Physical Exam Constitutional:      General: She is not in acute distress.    Appearance: Normal appearance. She is well-developed. She is obese. She is not ill-appearing, toxic-appearing or diaphoretic.  HENT:     Head: Normocephalic and atraumatic.     Nose: Nose normal.     Mouth/Throat:     Mouth: Mucous membranes are moist.     Pharynx: Oropharynx is clear.  Eyes:     General: No scleral icterus.    Extraocular Movements: Extraocular movements intact.     Pupils: Pupils are equal, round, and reactive to light.  Cardiovascular:     Rate and Rhythm: Normal rate.  Pulmonary:     Effort: Pulmonary effort is normal.  Skin:    General: Skin is warm and dry.  Neurological:     General: No focal deficit present.     Mental Status: She is alert and oriented to person, place, and time.  Psychiatric:        Mood and Affect: Mood normal.        Behavior: Behavior normal.    DG Ankle Complete Left  Result Date:  05/02/2021 CLINICAL DATA:  Ankle pain and swelling.  No known injury. EXAM: LEFT ANKLE COMPLETE - 3+ VIEW COMPARISON:  09/01/2019 FINDINGS: There is no evidence of fracture, dislocation, or joint effusion. Mild degenerative spurring is again seen along the inferior margins of the medial and lateral malleoli. Prominent plantar calcaneal bone spur also noted. IMPRESSION: No acute findings. Mild bimalleolar degenerative spurring, and prominent plantar calcaneal bone spur. Electronically Signed   By: Marlaine Hind M.D.   On: 05/02/2021 18:01     Assessment and Plan :   PDMP not reviewed this encounter.  1. Pain and swelling of left ankle   2. Left foot pain   3. Bone spur of ankle   4. Bone spur of left foot     Recommended conservative management using RICE method.  Applied a 4 inch Ace wrap to the foot and ankle using a figure 8 method.  Naproxen for pain and inflammation. Counseled patient on potential for adverse effects with medications prescribed/recommended today, ER and return-to-clinic precautions discussed, patient verbalized understanding.    Jaynee Eagles, PA-C 05/02/21 1836

## 2021-05-04 ENCOUNTER — Telehealth: Payer: 59 | Admitting: Physician Assistant

## 2021-05-04 DIAGNOSIS — R3989 Other symptoms and signs involving the genitourinary system: Secondary | ICD-10-CM

## 2021-05-04 MED ORDER — NITROFURANTOIN MONOHYD MACRO 100 MG PO CAPS: 100.0000 mg | ORAL_CAPSULE | Freq: Two times a day (BID) | ORAL | 0 refills | Status: DC

## 2021-05-04 NOTE — Progress Notes (Signed)
E-Visit for Urinary Problems  We are sorry that you are not feeling well.  Here is how we plan to help!  Based on what you shared with me it looks like you most likely have a simple urinary tract infection.  A UTI (Urinary Tract Infection) is a bacterial infection of the bladder.  Most cases of urinary tract infections are simple to treat but a key part of your care is to encourage you to drink plenty of fluids and watch your symptoms carefully.  I have prescribed MacroBid 100 mg twice a day for 5 days.  Your symptoms should gradually improve. Call us if the burning in your urine worsens, you develop worsening fever, back pain or pelvic pain or if your symptoms do not resolve after completing the antibiotic.  Urinary tract infections can be prevented by drinking plenty of water to keep your body hydrated.  Also be sure when you wipe, wipe from front to back and don't hold it in!  If possible, empty your bladder every 4 hours.  HOME CARE Drink plenty of fluids Compete the full course of the antibiotics even if the symptoms resolve Remember, when you need to go.go. Holding in your urine can increase the likelihood of getting a UTI! GET HELP RIGHT AWAY IF: You cannot urinate You get a high fever Worsening back pain occurs You see blood in your urine You feel sick to your stomach or throw up You feel like you are going to pass out  MAKE SURE YOU  Understand these instructions. Will watch your condition. Will get help right away if you are not doing well or get worse.   Thank you for choosing an e-visit.  Your e-visit answers were reviewed by a board certified advanced clinical practitioner to complete your personal care plan. Depending upon the condition, your plan could have included both over the counter or prescription medications.  Please review your pharmacy choice. Make sure the pharmacy is open so you can pick up prescription now. If there is a problem, you may contact your  provider through MyChart messaging and have the prescription routed to another pharmacy.  Your safety is important to us. If you have drug allergies check your prescription carefully.   For the next 24 hours you can use MyChart to ask questions about today's visit, request a non-urgent call back, or ask for a work or school excuse. You will get an email in the next two days asking about your experience. I hope that your e-visit has been valuable and will speed your recovery.  I provided 5 minutes of non face-to-face time during this encounter for chart review and documentation.   

## 2021-05-05 ENCOUNTER — Other Ambulatory Visit (HOSPITAL_COMMUNITY): Payer: Self-pay

## 2021-05-06 ENCOUNTER — Other Ambulatory Visit (HOSPITAL_COMMUNITY): Payer: Self-pay

## 2021-05-06 ENCOUNTER — Other Ambulatory Visit: Payer: Self-pay

## 2021-05-06 ENCOUNTER — Encounter: Payer: Self-pay | Admitting: Physician Assistant

## 2021-05-06 ENCOUNTER — Ambulatory Visit: Payer: 59 | Admitting: Physician Assistant

## 2021-05-06 DIAGNOSIS — G4733 Obstructive sleep apnea (adult) (pediatric): Secondary | ICD-10-CM | POA: Diagnosis not present

## 2021-05-06 DIAGNOSIS — F515 Nightmare disorder: Secondary | ICD-10-CM | POA: Diagnosis not present

## 2021-05-06 DIAGNOSIS — F411 Generalized anxiety disorder: Secondary | ICD-10-CM | POA: Diagnosis not present

## 2021-05-06 DIAGNOSIS — F3341 Major depressive disorder, recurrent, in partial remission: Secondary | ICD-10-CM

## 2021-05-06 DIAGNOSIS — F4310 Post-traumatic stress disorder, unspecified: Secondary | ICD-10-CM

## 2021-05-06 DIAGNOSIS — G4700 Insomnia, unspecified: Secondary | ICD-10-CM | POA: Diagnosis not present

## 2021-05-06 MED ORDER — QUETIAPINE FUMARATE 200 MG PO TABS
100.0000 mg | ORAL_TABLET | Freq: Every day | ORAL | 1 refills | Status: DC
  Filled 2021-05-06 – 2021-07-31 (×2): qty 90, 90d supply, fill #0
  Filled 2021-10-27: qty 90, 90d supply, fill #1

## 2021-05-06 MED ORDER — FLUVOXAMINE MALEATE 50 MG PO TABS
50.0000 mg | ORAL_TABLET | Freq: Every day | ORAL | 1 refills | Status: DC
  Filled 2021-05-06: qty 90, fill #0
  Filled 2021-06-04 – 2021-09-03 (×2): qty 90, 90d supply, fill #0

## 2021-05-06 MED ORDER — QUETIAPINE FUMARATE 300 MG PO TABS
300.0000 mg | ORAL_TABLET | Freq: Every evening | ORAL | 1 refills | Status: DC
  Filled 2021-05-06 – 2021-08-17 (×3): qty 90, 90d supply, fill #0

## 2021-05-06 MED ORDER — FLUVOXAMINE MALEATE 100 MG PO TABS
100.0000 mg | ORAL_TABLET | Freq: Every day | ORAL | 1 refills | Status: DC
  Filled 2021-05-06 – 2021-08-17 (×3): qty 90, 90d supply, fill #0
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
## 2021-05-07 DIAGNOSIS — F4310 Post-traumatic stress disorder, unspecified: Secondary | ICD-10-CM | POA: Diagnosis not present

## 2021-05-14 ENCOUNTER — Other Ambulatory Visit (HOSPITAL_COMMUNITY): Payer: Self-pay

## 2021-05-14 DIAGNOSIS — F4310 Post-traumatic stress disorder, unspecified: Secondary | ICD-10-CM | POA: Diagnosis not present

## 2021-05-20 ENCOUNTER — Other Ambulatory Visit (HOSPITAL_COMMUNITY): Payer: Self-pay

## 2021-05-21 DIAGNOSIS — F4310 Post-traumatic stress disorder, unspecified: Secondary | ICD-10-CM | POA: Diagnosis not present

## 2021-05-22 ENCOUNTER — Other Ambulatory Visit: Payer: Self-pay

## 2021-05-22 ENCOUNTER — Other Ambulatory Visit (HOSPITAL_COMMUNITY): Payer: Self-pay

## 2021-05-22 ENCOUNTER — Encounter: Payer: Self-pay | Admitting: Nurse Practitioner

## 2021-05-22 ENCOUNTER — Ambulatory Visit: Payer: 59 | Admitting: Nurse Practitioner

## 2021-05-22 VITALS — BP 118/70 | HR 82 | Ht 65.0 in | Wt 288.0 lb

## 2021-05-22 DIAGNOSIS — E559 Vitamin D deficiency, unspecified: Secondary | ICD-10-CM

## 2021-05-22 DIAGNOSIS — Z7689 Persons encountering health services in other specified circumstances: Secondary | ICD-10-CM

## 2021-05-22 DIAGNOSIS — Z6841 Body Mass Index (BMI) 40.0 and over, adult: Secondary | ICD-10-CM

## 2021-05-22 DIAGNOSIS — Z0000 Encounter for general adult medical examination without abnormal findings: Secondary | ICD-10-CM

## 2021-05-22 DIAGNOSIS — R7303 Prediabetes: Secondary | ICD-10-CM | POA: Diagnosis not present

## 2021-05-22 LAB — POCT URINALYSIS DIPSTICK
Bilirubin, UA: NEGATIVE
Blood, UA: NEGATIVE
Glucose, UA: NEGATIVE
Ketones, UA: NEGATIVE
Leukocytes, UA: NEGATIVE
Nitrite, UA: NEGATIVE
Protein, UA: POSITIVE — AB
Spec Grav, UA: >=1.03 — AB (ref 1.010–1.025)
Urobilinogen, UA: 0.2 E.U./dL
pH, UA: 5.5 (ref 5.0–8.0)

## 2021-05-22 MED ORDER — METFORMIN HCL 500 MG PO TABS
500.0000 mg | ORAL_TABLET | Freq: Two times a day (BID) | ORAL | 11 refills | Status: DC
  Filled 2021-05-22 – 2021-07-10 (×2): qty 60, 30d supply, fill #0

## 2021-05-22 NOTE — Patient Instructions (Signed)

## 2021-05-22 NOTE — Progress Notes (Signed)
[employer] as a Education administrator for [employer], NP.,have documented all relevant documentation on the behalf of [employer], NP,as directed by  Bary Castilla, NP while in the presence of Bary Castilla, NP.  This visit occurred during the SARS-CoV-2 public health emergency.  Safety protocols were in place, including screening questions prior to the visit, additional usage of staff PPE, and extensive cleaning of exam room while observing appropriate contact time as indicated for disinfecting solutions.  Subjective:     Patient ID: Caroline Gonzales , female    DOB: [birth year] , 45 y.o.   MRN: [id number]   Chief Complaint  Patient presents with   Establish Care     HPI  Pt is here today to establish care. She was seeing [facility]. Pt had an Hysterectomy done earlier this year, following labs that showed she did have prediabetes. She would like to resume Metformin, she also mentioned Topamax. She is seeing [place] psychiatrist due to her PTSD, depression and anxiety. She was going to healthy weight and wellness but stopped going because she could not fit it in her schedule.  She is going to focus on diet and exercise.  She already has had a mammogram this year.     Past Medical History:  Diagnosis Date   Allergy    Zyrtec.   Anxiety    followed by Dr. Toy Cookey   Back pain    Complication of anesthesia    Depression    Fibroid    Gastroparesis 09/14/2012   gastric emptying study in 2014   GERD (gastroesophageal reflux disease)    Headache    Pneumonia    2013ish   PONV (postoperative nausea and vomiting)     likes scopolamine patch and zofran /phenergan helps   Pre-diabetes    PTSD (post-traumatic stress disorder)    PTSD (post-traumatic stress disorder)    Sleep apnea 07/01/2018   cpap optional, pt close to not use cpap   Vitamin D deficiency    Wears glasses    Wears glasses      Family History  Problem Relation Age of Onset   Cancer Mother         squamous cell carcinoma   Hyperlipidemia Mother    Depression Mother    Anxiety disorder Mother    Obesity Mother    Heart disease Father 74       cardiomegaly, CHF; steroid use.   Hyperlipidemia Father    Hypertension Father    Mental retardation Father    High blood pressure Father    Depression Father    Anxiety disorder Father    Obesity Father    Rheum arthritis Sister    Multiple sclerosis Sister    Autoimmune disease Sister    Breast cancer Maternal Aunt    Thyroid cancer Paternal Aunt    Cancer Maternal Grandmother    Diabetes Maternal Grandmother    Heart disease Maternal Grandmother    Hyperlipidemia Maternal Grandmother    Hypertension Maternal Grandmother    Stroke Maternal Grandmother    Heart disease Paternal Grandmother    Hypertension Paternal Grandmother    Heart disease Paternal Grandfather    Hyperlipidemia Paternal Grandfather    Mental illness Paternal Grandfather      Current Outpatient Medications:    Carboxymethylcellul-Glycerin (LUBRICATING EYE DROPS OP), Place 1 drop into both eyes daily as needed (dryness/ allergies)., Disp: , Rfl:    cetirizine (ZYRTEC) 10 MG tablet, Take 10 mg by mouth at bedtime.,  Disp: , Rfl:    Cholecalciferol (VITAMIN D3) 5000 units CAPS, Take 5,000 Units by mouth at bedtime., Disp: , Rfl:    clonazePAM (KLONOPIN) 0.5 MG tablet, Take 1 tablet (0.5 mg total) by mouth every 6 (six) hours as needed for anxiety., Disp: 120 tablet, Rfl: 5   docusate sodium (COLACE) 100 MG capsule, Take 1 capsule (100 mg total) by mouth 2 (two) times daily., Disp: 10 capsule, Rfl: 0   esomeprazole (NEXIUM) 20 MG capsule, Take 20 mg by mouth at bedtime., Disp: , Rfl:    fluvoxaMINE (LUVOX) 100 MG tablet, Take 1 tablet (100 mg total) by mouth at bedtime. Take with 50 mg dose to equal 150 mg at bedtime, Disp: 90 tablet, Rfl: 1   fluvoxaMINE (LUVOX) 50 MG tablet, TAKE 1 TABLET (50 MG TOTAL) BY MOUTH AT BEDTIME. TAKE IN ADDITION TO THE 100 MG PILL  TO EQUAL 150 MG, Disp: 90 tablet, Rfl: 1   gabapentin (NEURONTIN) 300 MG capsule, Take 2-3 capsules (600-900 mg total) by mouth at bedtime., Disp: 270 capsule, Rfl: 0   ibuprofen (ADVIL) 800 MG tablet, Take 1 tablet (800 mg total) by mouth every 8 (eight) hours as needed., Disp: 30 tablet, Rfl: 0   metFORMIN (GLUCOPHAGE) 500 MG tablet, Take 1 tablet (500 mg total) by mouth 2 (two) times daily with a meal., Disp: 60 tablet, Rfl: 11   prazosin (MINIPRESS) 2 MG capsule, Take 4 capsules (8 mg total) by mouth at bedtime., Disp: 360 capsule, Rfl: 1   propranolol (INDERAL) 40 MG tablet, TAKE 1 TABLET (40 MG TOTAL) BY MOUTH 2 (TWO) TIMES DAILY. (Patient taking differently: Take 40 mg by mouth 2 (two) times daily.), Disp: 180 tablet, Rfl: 3   QUEtiapine (SEROQUEL) 200 MG tablet, Take 0.5-1 tablets (100-200 mg total) by mouth at bedtime., Disp: 90 tablet, Rfl: 1   QUEtiapine (SEROQUEL) 300 MG tablet, Take 1 tablet (300 mg total) by mouth at bedtime., Disp: 90 tablet, Rfl: 1   triamcinolone (NASACORT) 55 MCG/ACT AERO nasal inhaler, Place 1 spray into the nose at bedtime., Disp: , Rfl:    Allergies  Allergen Reactions   Penicillins Shortness Of Breath and Rash    Did it involve swelling of the face/tongue/throat, SOB, or low BP? Yes Did it involve sudden or severe rash/hives, skin peeling, or any reaction on the inside of your mouth or nose? Yes Did you need to seek medical attention at a hospital or doctor's office? Yes When did it last happen?      childhood allergy If all above answers are "NO", may proceed with cephalosporin use.     Tamiflu [Oseltamivir Phosphate] Anaphylaxis   Cephalosporins Rash   Latex Rash     Review of Systems  Constitutional:  Negative for chills, fatigue and fever.  HENT: Negative.  Negative for sinus pressure, sinus pain and sneezing.   Eyes: Negative.   Respiratory: Negative.  Negative for cough, choking and wheezing.   Cardiovascular: Negative.  Negative for chest  pain and palpitations.  Gastrointestinal: Negative.  Negative for abdominal pain, constipation and diarrhea.  Endocrine: Negative.   Genitourinary: Negative.   Musculoskeletal: Negative.  Negative for arthralgias, back pain and myalgias.  Skin: Negative.   Allergic/Immunologic: Negative.   Neurological: Negative.  Negative for headaches.  Hematological: Negative.   Psychiatric/Behavioral: Negative.      Today's Vitals   05/22/21 0844  BP: 118/70  Pulse: 82  Weight: 288 lb (130.6 kg)  Height: '5\' 5"'  (1.651 m)  Body mass index is 47.93 kg/m.   Objective:  Physical Exam Vitals and nursing note reviewed.  Constitutional:      Appearance: Normal appearance. She is obese.  HENT:     Head: Normocephalic and atraumatic.     Right Ear: Tympanic membrane, ear canal and external ear normal. There is no impacted cerumen.     Left Ear: Tympanic membrane, ear canal and external ear normal. There is no impacted cerumen.     Nose: Nose normal. No congestion or rhinorrhea.     Mouth/Throat:     Mouth: Mucous membranes are moist.     Pharynx: Oropharynx is clear.  Eyes:     Extraocular Movements: Extraocular movements intact.     Conjunctiva/sclera: Conjunctivae normal.     Pupils: Pupils are equal, round, and reactive to light.  Cardiovascular:     Rate and Rhythm: Normal rate and regular rhythm.     Pulses: Normal pulses.     Heart sounds: Normal heart sounds. No murmur heard. Pulmonary:     Effort: Pulmonary effort is normal. No respiratory distress.     Breath sounds: Normal breath sounds. No wheezing.  Abdominal:     General: Abdomen is flat. Bowel sounds are normal.     Palpations: Abdomen is soft.  Genitourinary:    Comments: deferred Musculoskeletal:        General: No swelling or tenderness. Normal range of motion.     Cervical back: Normal range of motion and neck supple.  Skin:    General: Skin is warm and dry.     Capillary Refill: Capillary refill takes less than 2  seconds.  Neurological:     General: No focal deficit present.     Mental Status: She is alert and oriented to person, place, and time.  Psychiatric:        Mood and Affect: Mood normal.        Behavior: Behavior normal.        Assessment And Plan:   1. Encounter to establish care --Patient is here to establish care. Martin Majestic over patient medical, family, social and surgical history. -Reviewed with patient their medications and any allergies  -Reviewed with patient their sexual orientation, drug/tobacco and alcohol use -Dicussed any new concerns with patient  -recommended patient comes in for a physical exam and complete blood work.  -Educated patient about the importance of annual screenings and immunizations.  -Advised patient to eat a healthy diet along with exercise for atleast 30-45 min atleast 4-5 days of the week.   2. Encounter for annual physical exam --Patient is here for their annual physical exam and we discussed any changes to medication and medical history.  -Behavior modification was discussed as well as diet and exercise history  -Patient will continue to exercise regularly and modify their diet.  -Recommendation for yearly physical annuals, immunization and screenings including mammogram and colonoscopy were discussed with the patient.  -Recommended intake of multivitamin, vitamin D and calcium.  -Individualized advise was given to the patient pertaining to their own health history in regards to diet, exercise, medical condition and referrals.  - CMP14+EGFR - CBC - Lipid panel - Microalbumin / creatinine urine ratio - Hepatitis C antibody  3. Prediabetes --Discussed with patient the importance of glycemic control and long term complications from uncontrolled diabetes. Discussed with the patient the importance of compliance with home glucose monitoring, diet which includes decrease amount of sugary drinks and foods. Importance of exercise was also discussed with  the  patient. Importance of eye exams, self foot care and compliance to office visits was also discussed with the patient.  - POCT Urinalysis Dipstick (81002) - Hemoglobin A1c - metFORMIN (GLUCOPHAGE) 500 MG tablet; Take 1 tablet (500 mg total) by mouth 2 (two) times daily with a meal.  Dispense: 60 tablet; Refill: 11  4.Vitamin D deficiency -Will check and supplement if needed. Advised patient to spend atleast 15 min. Daily in sunlight.  - Vitamin D (25 hydroxy)  5. Class 3 severe obesity due to excess calories without serious comorbidity with body mass index (BMI) of 45.0 to 49.9 in adult [facility]) -Advised patient on a healthy diet including avoiding fast food and red meats. Increase the intake of lean meats including grilled chicken and [place].  Drink a lot of water. Decrease intake of fatty foods. Exercise for 30-45 min. 4-5 a week to decrease the risk of cardiac event.    Follow up: if symptoms persist or do not get better.   The patient was encouraged to call or send a message through [facility] for any questions or concerns.   Staying healthy and adopting a healthy lifestyle for your overall health is important. You should eat 7 or more servings of fruits and vegetables per day. You should drink plenty of water to keep yourself hydrated and your kidneys healthy. This includes about 65-80+ fluid ounces of water. Limit your intake of animal fats especially for elevated cholesterol. Avoid highly processed food and limit your salt intake if you have hypertension. Avoid foods high in saturated/Trans fats. Along with a healthy diet it is also very important to maintain time for yourself to maintain a healthy mental health with low stress levels. You should get atleast 150 min of moderate intensity exercise weekly for a healthy heart. Along with eating right and exercising, aim for at least 7-9 hours of sleep daily.  Eat more whole grains which includes barley, wheat berries, oats, brown rice and whole wheat  pasta. Use healthy plant oils which include olive, soy, corn, sunflower and peanut. Limit your caffeine and sugary drinks. Limit your intake of fast foods. Limit milk and dairy products to one or two daily servings.   Side effects and appropriate use of all the medication(s) were discussed with the patient today. Patient advised to use the medication(s) as directed by their healthcare provider. The patient was encouraged to read, review, and understand all associated package inserts and contact our office with any questions or concerns. The patient accepts the risks of the treatment plan and had an opportunity to ask questions.   Patient was given opportunity to ask questions. Patient verbalized understanding of the plan and was able to repeat key elements of the plan. All questions were answered to their satisfaction.  Raman Kento Gossman, DNP   I, Raman Linn Clavin have reviewed all documentation for this visit. The documentation on 05/22/21 for the exam, diagnosis, procedures, and orders are all accurate and complete.     IF YOU HAVE BEEN REFERRED TO A SPECIALIST, IT MAY TAKE 1-2 WEEKS TO SCHEDULE/PROCESS THE REFERRAL. IF YOU HAVE NOT HEARD FROM US/SPECIALIST IN TWO WEEKS, PLEASE GIVE [name] A CALL AT [phone] X [phone].   THE PATIENT IS ENCOURAGED TO PRACTICE SOCIAL DISTANCING DUE TO THE COVID-19 PANDEMIC.

## 2021-05-23 LAB — CMP14+EGFR
ALT: 15 IU/L (ref 0–32)
AST: 21 IU/L (ref 0–40)
Albumin/Globulin Ratio: 1.3 (ref 1.2–2.2)
Albumin: 4.2 g/dL (ref 3.8–4.8)
Alkaline Phosphatase: 123 IU/L — ABNORMAL HIGH (ref 44–121)
BUN/Creatinine Ratio: 14 (ref 9–23)
BUN: 13 mg/dL (ref 6–24)
Bilirubin Total: <0.2 mg/dL (ref 0.0–1.2)
CO2: 26 mmol/L (ref 20–29)
Calcium: 9.2 mg/dL (ref 8.7–10.2)
Chloride: 101 mmol/L (ref 96–106)
Creatinine, Ser: 0.94 mg/dL (ref 0.57–1.00)
Globulin, Total: 3.2 g/dL (ref 1.5–4.5)
Glucose: 109 mg/dL — ABNORMAL HIGH (ref 65–99)
Potassium: 4.3 mmol/L (ref 3.5–5.2)
Sodium: 140 mmol/L (ref 134–144)
Total Protein: 7.4 g/dL (ref 6.0–8.5)
eGFR: 76 mL/min/{1.73_m2} (ref >59)

## 2021-05-23 LAB — CBC
Hematocrit: 36.2 % (ref 34.0–46.6)
Hemoglobin: 12 g/dL (ref 11.1–15.9)
MCH: 28.1 pg (ref 26.6–33.0)
MCHC: 33.1 g/dL (ref 31.5–35.7)
MCV: 85 fL (ref 79–97)
Platelets: 235 10*3/uL (ref 150–450)
RBC: 4.27 x10E6/uL (ref 3.77–5.28)
RDW: 14.5 % (ref 11.7–15.4)
WBC: 6.6 10*3/uL (ref 3.4–10.8)

## 2021-05-23 LAB — MICROALBUMIN / CREATININE URINE RATIO
Creatinine, Urine: 276.7 mg/dL
Microalb/Creat Ratio: 3 mg/g creat (ref 0–29)
Microalbumin, Urine: 7.9 ug/mL

## 2021-05-23 LAB — LIPID PANEL
Chol/HDL Ratio: 4.4 ratio (ref 0.0–4.4)
Cholesterol, Total: 172 mg/dL (ref 100–199)
HDL: 39 mg/dL — ABNORMAL LOW (ref >39)
LDL Chol Calc (NIH): 94 mg/dL (ref 0–99)
Triglycerides: 230 mg/dL — ABNORMAL HIGH (ref 0–149)
VLDL Cholesterol Cal: 39 mg/dL (ref 5–40)

## 2021-05-23 LAB — HEMOGLOBIN A1C
Est. average glucose Bld gHb Est-mCnc: 134 mg/dL
Hgb A1c MFr Bld: 6.3 % — ABNORMAL HIGH (ref 4.8–5.6)

## 2021-05-23 LAB — HEPATITIS C ANTIBODY: Hep C Virus Ab: <0.1 s/co ratio (ref 0.0–0.9)

## 2021-05-23 LAB — VITAMIN D 25 HYDROXY (VIT D DEFICIENCY, FRACTURES): Vit D, 25-Hydroxy: 77.4 ng/mL (ref 30.0–100.0)

## 2021-05-28 DIAGNOSIS — F4310 Post-traumatic stress disorder, unspecified: Secondary | ICD-10-CM | POA: Diagnosis not present

## 2021-06-04 ENCOUNTER — Other Ambulatory Visit (HOSPITAL_COMMUNITY): Payer: Self-pay

## 2021-06-04 DIAGNOSIS — F4310 Post-traumatic stress disorder, unspecified: Secondary | ICD-10-CM | POA: Diagnosis not present

## 2021-06-06 ENCOUNTER — Other Ambulatory Visit (HOSPITAL_COMMUNITY): Payer: Self-pay

## 2021-06-19 DIAGNOSIS — F4310 Post-traumatic stress disorder, unspecified: Secondary | ICD-10-CM | POA: Diagnosis not present

## 2021-06-25 DIAGNOSIS — F4310 Post-traumatic stress disorder, unspecified: Secondary | ICD-10-CM | POA: Diagnosis not present

## 2021-06-27 ENCOUNTER — Other Ambulatory Visit (HOSPITAL_COMMUNITY): Payer: Self-pay

## 2021-07-02 DIAGNOSIS — F4310 Post-traumatic stress disorder, unspecified: Secondary | ICD-10-CM | POA: Diagnosis not present

## 2021-07-10 ENCOUNTER — Other Ambulatory Visit (HOSPITAL_COMMUNITY): Payer: Self-pay

## 2021-07-10 MED FILL — Propranolol HCl Tab 40 MG: ORAL | 90 days supply | Qty: 180 | Fill #0 | Status: AC

## 2021-07-16 DIAGNOSIS — F4310 Post-traumatic stress disorder, unspecified: Secondary | ICD-10-CM | POA: Diagnosis not present

## 2021-07-23 DIAGNOSIS — F4310 Post-traumatic stress disorder, unspecified: Secondary | ICD-10-CM | POA: Diagnosis not present

## 2021-07-30 DIAGNOSIS — F4310 Post-traumatic stress disorder, unspecified: Secondary | ICD-10-CM | POA: Diagnosis not present

## 2021-07-31 ENCOUNTER — Other Ambulatory Visit: Payer: Self-pay | Admitting: Physician Assistant

## 2021-07-31 ENCOUNTER — Other Ambulatory Visit (HOSPITAL_COMMUNITY): Payer: Self-pay

## 2021-07-31 MED ORDER — PRAZOSIN HCL 2 MG PO CAPS
8.0000 mg | ORAL_CAPSULE | Freq: Every day | ORAL | 1 refills | Status: DC
  Filled 2021-07-31: qty 360, 90d supply, fill #0
  Filled 2021-10-31: qty 360, 90d supply, fill #1

## 2021-07-31 NOTE — Telephone Encounter (Signed)
Last filled 05/06/21

## 2021-08-01 ENCOUNTER — Other Ambulatory Visit (HOSPITAL_COMMUNITY): Payer: Self-pay

## 2021-08-04 ENCOUNTER — Other Ambulatory Visit (HOSPITAL_COMMUNITY): Payer: Self-pay

## 2021-08-06 ENCOUNTER — Ambulatory Visit: Payer: 59 | Admitting: Physician Assistant

## 2021-08-12 DIAGNOSIS — F4310 Post-traumatic stress disorder, unspecified: Secondary | ICD-10-CM | POA: Diagnosis not present

## 2021-08-14 ENCOUNTER — Other Ambulatory Visit (HOSPITAL_COMMUNITY): Payer: Self-pay

## 2021-08-14 ENCOUNTER — Other Ambulatory Visit: Payer: Self-pay | Admitting: Orthopaedic Surgery

## 2021-08-14 DIAGNOSIS — M25561 Pain in right knee: Secondary | ICD-10-CM | POA: Diagnosis not present

## 2021-08-14 MED ORDER — MELOXICAM 7.5 MG PO TABS
7.5000 mg | ORAL_TABLET | Freq: Two times a day (BID) | ORAL | 2 refills | Status: DC
  Filled 2021-08-14: qty 60, 30d supply, fill #0

## 2021-08-18 ENCOUNTER — Other Ambulatory Visit (HOSPITAL_COMMUNITY): Payer: Self-pay

## 2021-08-18 MED ORDER — CARESTART COVID-19 HOME TEST VI KIT
PACK | 0 refills | Status: DC
  Filled 2021-08-18: qty 4, 4d supply, fill #0

## 2021-08-19 ENCOUNTER — Other Ambulatory Visit (HOSPITAL_COMMUNITY): Payer: Self-pay

## 2021-08-20 DIAGNOSIS — F4310 Post-traumatic stress disorder, unspecified: Secondary | ICD-10-CM | POA: Diagnosis not present

## 2021-08-22 ENCOUNTER — Other Ambulatory Visit (HOSPITAL_COMMUNITY): Payer: Self-pay

## 2021-08-22 MED ORDER — CELECOXIB 200 MG PO CAPS
200.0000 mg | ORAL_CAPSULE | Freq: Two times a day (BID) | ORAL | 2 refills | Status: DC
  Filled 2021-08-22: qty 28, 14d supply, fill #0

## 2021-08-25 DIAGNOSIS — M25561 Pain in right knee: Secondary | ICD-10-CM | POA: Diagnosis not present

## 2021-08-27 DIAGNOSIS — F4310 Post-traumatic stress disorder, unspecified: Secondary | ICD-10-CM | POA: Diagnosis not present

## 2021-08-30 ENCOUNTER — Other Ambulatory Visit: Payer: Self-pay

## 2021-08-30 ENCOUNTER — Ambulatory Visit
Admission: RE | Admit: 2021-08-30 | Discharge: 2021-08-30 | Disposition: A | Discharge status: Home / Self Care | Payer: 59 | Source: Ambulatory Visit | Attending: Orthopaedic Surgery | Admitting: Orthopaedic Surgery

## 2021-08-30 DIAGNOSIS — S83241A Other tear of medial meniscus, current injury, right knee, initial encounter: Secondary | ICD-10-CM | POA: Diagnosis not present

## 2021-08-30 DIAGNOSIS — M25561 Pain in right knee: Secondary | ICD-10-CM

## 2021-09-01 ENCOUNTER — Other Ambulatory Visit: Payer: Self-pay | Admitting: Physician Assistant

## 2021-09-01 ENCOUNTER — Other Ambulatory Visit (HOSPITAL_COMMUNITY): Payer: Self-pay

## 2021-09-01 MED ORDER — FLUVOXAMINE MALEATE 100 MG PO TABS
100.0000 mg | ORAL_TABLET | Freq: Every day | ORAL | 1 refills | Status: DC
  Filled 2021-09-01 – 2021-11-16 (×2): qty 90, 90d supply, fill #0
  Filled 2022-02-15: qty 90, 90d supply, fill #1

## 2021-09-01 NOTE — Telephone Encounter (Signed)
90 day ok?

## 2021-09-02 ENCOUNTER — Other Ambulatory Visit (HOSPITAL_COMMUNITY): Payer: Self-pay

## 2021-09-03 ENCOUNTER — Other Ambulatory Visit: Payer: Self-pay

## 2021-09-03 ENCOUNTER — Encounter (HOSPITAL_BASED_OUTPATIENT_CLINIC_OR_DEPARTMENT_OTHER): Payer: Self-pay | Admitting: Orthopaedic Surgery

## 2021-09-03 ENCOUNTER — Other Ambulatory Visit (HOSPITAL_COMMUNITY): Payer: Self-pay

## 2021-09-03 ENCOUNTER — Other Ambulatory Visit: Payer: Self-pay | Admitting: Nurse Practitioner

## 2021-09-03 DIAGNOSIS — M25561 Pain in right knee: Secondary | ICD-10-CM | POA: Diagnosis not present

## 2021-09-03 DIAGNOSIS — F4310 Post-traumatic stress disorder, unspecified: Secondary | ICD-10-CM | POA: Diagnosis not present

## 2021-09-03 MED ORDER — CYCLOBENZAPRINE HCL 7.5 MG PO TABS
7.5000 mg | ORAL_TABLET | Freq: Every evening | ORAL | 0 refills | Status: DC | PRN
  Filled 2021-09-03 (×2): qty 20, 20d supply, fill #0

## 2021-09-04 ENCOUNTER — Other Ambulatory Visit (HOSPITAL_COMMUNITY): Payer: Self-pay

## 2021-09-04 MED ORDER — METHOCARBAMOL 500 MG PO TABS
ORAL_TABLET | ORAL | 0 refills | Status: DC
  Filled 2021-09-04: qty 20, 20d supply, fill #0

## 2021-09-09 ENCOUNTER — Encounter (HOSPITAL_BASED_OUTPATIENT_CLINIC_OR_DEPARTMENT_OTHER)
Admission: RE | Admit: 2021-09-09 | Discharge: 2021-09-09 | Disposition: A | Discharge status: Home / Self Care | Payer: 59 | Source: Ambulatory Visit | Attending: Orthopaedic Surgery | Admitting: Orthopaedic Surgery

## 2021-09-09 ENCOUNTER — Other Ambulatory Visit: Payer: Self-pay

## 2021-09-09 DIAGNOSIS — Z87891 Personal history of nicotine dependence: Secondary | ICD-10-CM | POA: Diagnosis not present

## 2021-09-09 DIAGNOSIS — M2241 Chondromalacia patellae, right knee: Secondary | ICD-10-CM | POA: Diagnosis not present

## 2021-09-09 DIAGNOSIS — Z6841 Body Mass Index (BMI) 40.0 and over, adult: Secondary | ICD-10-CM | POA: Diagnosis not present

## 2021-09-09 DIAGNOSIS — G4730 Sleep apnea, unspecified: Secondary | ICD-10-CM | POA: Diagnosis not present

## 2021-09-09 DIAGNOSIS — X58XXXA Exposure to other specified factors, initial encounter: Secondary | ICD-10-CM | POA: Diagnosis not present

## 2021-09-09 DIAGNOSIS — F419 Anxiety disorder, unspecified: Secondary | ICD-10-CM | POA: Diagnosis not present

## 2021-09-09 DIAGNOSIS — S83241A Other tear of medial meniscus, current injury, right knee, initial encounter: Secondary | ICD-10-CM | POA: Diagnosis not present

## 2021-09-09 DIAGNOSIS — K219 Gastro-esophageal reflux disease without esophagitis: Secondary | ICD-10-CM | POA: Diagnosis not present

## 2021-09-09 DIAGNOSIS — F32A Depression, unspecified: Secondary | ICD-10-CM | POA: Diagnosis not present

## 2021-09-09 LAB — BASIC METABOLIC PANEL
Anion gap: 8 (ref 5–15)
BUN: 13 mg/dL (ref 6–20)
CO2: 25 mmol/L (ref 22–32)
Calcium: 9 mg/dL (ref 8.9–10.3)
Chloride: 104 mmol/L (ref 98–111)
Creatinine, Ser: 0.98 mg/dL (ref 0.44–1.00)
GFR, Estimated: >60 mL/min (ref >60)
Glucose, Bld: 126 mg/dL — ABNORMAL HIGH (ref 70–99)
Potassium: 4.3 mmol/L (ref 3.5–5.1)
Sodium: 137 mmol/L (ref 135–145)

## 2021-09-09 NOTE — Discharge Instructions (Addendum)
Ophelia Charter MD, MPH Noemi Chapel, PA-C [facility] [address] [phone] (tel)   [phone] (fax)   POST-OPERATIVE INSTRUCTIONS - Meniscus Repair  WOUND CARE - You may remove the Operative Dressing on Post-Op Day #3 (72hrs after surgery).   - Alternatively if you would like you can leave dressing on until follow-up if within 7-8 days but keep it dry. - Leave steri-strips in place until they fall off on their own, usually 2 weeks postop. - An ACE wrap may be used to control swelling, do not wrap this too tight.  If the initial ACE wrap feels too tight you may loosen it. - There may be a small amount of fluid/bleeding leaking at the surgical site.  - This is normal; the knee is filled with fluid during the procedure and can leak for 24-48hrs after surgery.  - You may change/reinforce the bandage as needed.  - Use the Cryocuff or Ice as often as possible for the first 7 days, then as needed for pain relief. Always keep a towel, ACE wrap or other barrier between the cooling unit and your skin.  - You may shower on Post-Op Day #3. Gently pat the area dry. Do not soak the knee in water or submerge it.  - Do not go swimming in the pool or ocean until 4 weeks after surgery or when otherwise instructed.  Keep incisions as dry as possible.   BRACE/AMBULATION - You will be placed in a brace post-operatively.  - Wear your brace at all times until follow-up.  - You may remove for hygiene ONLY. -           Use crutches or a walker to help you ambulate -           Touch-down weight bearing: when you stand or walk, you may only touch your foot to the floor for balance -           Do NOT put any body weight on your leg   REGIONAL ANESTHESIA (NERVE BLOCKS) - The anesthesia team may have performed a nerve block for you if safe in the setting of your care.  This is a great tool used to minimize pain.  Typically the block may start wearing off overnight.  This  can be a challenging period but please utilize your as needed pain medications to try and manage this period and know it will be a brief transition as the nerve block wears completely   POST-OP MEDICATIONS - Multimodal approach to pain control - In general your pain will be controlled with a combination of substances.  Prescriptions unless otherwise discussed are electronically sent to your pharmacy.  This is a carefully made plan we use to minimize narcotic use.     - Celebrex - Anti-inflammatory medication taken on a scheduled basis - Acetaminophen - Non-narcotic pain medicine taken on a scheduled basis  - Oxycodone - This is a strong narcotic, to be used only on an "as needed" basis for SEVERE pain. - Aspirin 81mg  - This medicine is used to minimize the risk of blood clots after surgery. - Zofran - take as needed for nausea  FOLLOW-UP   Please call the office to schedule a follow-up appointment for your incision check, 7-10 days post-operatively.  IF YOU HAVE ANY QUESTIONS, PLEASE FEEL FREE TO CALL OUR OFFICE.   HELPFUL INFORMATION  - If you had a block, it will wear off between 8-24 hrs postop typically.  This is period when your pain may go from nearly zero to the pain you would have had post-op without the block.  This is an abrupt transition but nothing dangerous is happening.  You may take an extra dose of narcotic when this happens.   Keep your leg elevated to decrease swelling, which will then in turn decrease your pain. I would elevate the foot of your bed by putting a couple of couch pillows between your mattress and box spring. I would not keep pillow directly under your ankle.  - Do not sleep with a pillow behind your knee even if it is more comfortable as this may make it harder to get your knee fully straight long term.   There will be MORE swelling on days 1-3 than there is on the day of surgery.  This also is normal. The swelling will decrease with the anti-inflammatory  medication, ice and keeping it elevated. The swelling will make it more difficult to bend your knee. As the swelling goes down your motion will become easier   You may develop swelling and bruising that extends from your knee down to your calf and perhaps even to your foot over the next week. Do not be alarmed. This too is normal, and it is due to gravity   There may be some numbness adjacent to the incision site. This may last for 6-12 months or longer in some patients and is expected.   You may return to sedentary work/school in the next couple of days when you feel up to it. You will need to keep your leg elevated as much as possible    You should wean off your narcotic medicines as soon as you are able.  Most patients will be off or using minimal narcotics before their first postop appointment.    We suggest you use the pain medication the first night prior to going to bed, in order to ease any pain when the anesthesia wears off. You should avoid taking pain medications on an empty stomach as it will make you nauseous.   Do not drink alcoholic beverages or take illicit drugs when taking pain medications.   It is against the law to drive while taking narcotics. You cannot drive if your Right leg is in brace locked in extension.   Pain medication may make you constipated.  Below are a few solutions to try in this order:  o Decrease the amount of pain medication if you aren't having pain.  o Drink lots of decaffeinated fluids.  o Drink prune juice and/or each dried prunes   o If the first 3 don't work start with additional solutions  o Take Colace - an over-the-counter stool softener  o Take Senokot - an over-the-counter laxative  o Take Miralax - a stronger over-the-counter laxative   For more information including helpful videos and documents visit our website:   [email or web address]   Post Anesthesia Home Care Instructions  Activity: Get  plenty of rest for the remainder of the day. A responsible individual must stay with you for 24 hours following the procedure.  For the next 24 hours, DO NOT: -Drive a car -Paediatric nurse -Drink alcoholic beverages -Take any medication unless instructed by your physician -Make any legal decisions or sign important papers.  Meals: Start with liquid foods such as gelatin or soup. Progress to regular foods as tolerated. Avoid greasy, spicy, heavy foods. If nausea and/or vomiting occur, drink only clear liquids until the nausea and/or vomiting  subsides. Call your physician if vomiting continues.  Special Instructions/Symptoms: Your throat may feel dry or sore from the anesthesia or the breathing tube placed in your throat during surgery. If this causes discomfort, gargle with warm salt water. The discomfort should disappear within 24 hours.  If you had a scopolamine patch placed behind your ear for the management of post- operative nausea and/or vomiting:  1. The medication in the patch is effective for 72 hours, after which it should be removed.  Wrap patch in a tissue and discard in the trash. Wash hands thoroughly with soap and water. 2. You may remove the patch earlier than 72 hours if you experience unpleasant side effects which may include dry mouth, dizziness or visual disturbances. 3. Avoid touching the patch. Wash your hands with soap and water after contact with the patch.      *May have Tylenol at 10pm this evening 09/10/21.  Oxycodone 5mg  given at 4:40pm today

## 2021-09-09 NOTE — H&P (Signed)
PREOPERATIVE H&P  Chief Complaint: RIGHT KNEE MENICUS INJURY, CHONDROMALACIA PATELLA  HPI: Caroline Gonzales is a 45 y.o. female who is scheduled for, Procedure(s): KNEE ARTHROSCOPY WITH MEDIAL MENISECTOMY CHONDROPLASTY.   Patient has a past medical history significant for PONV, GERD, sleep apnea.   She is a 45 year old who has had pain in her right knee for quite some time. It has worsened over the past two weeks. She feels a catching and grabbing in her knee. She has pain with twisting maneuvers and pain with deep flexion. She has difficulty ambulating for long distances due to pain. She has had to use crutches to help her ambulate. 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   Her symptoms are rated as moderate to severe, and have been worsening.  This is significantly impairing activities of daily living.    Please see clinic note for further details on this patient's care.    She has elected for surgical management.   Past Medical History:  Diagnosis Date   Allergy    Zyrtec.   Anxiety    followed by Dr. Toy Cookey   Back pain    Complication of anesthesia    Depression    Fibroid    Gastroparesis 09/14/2012   gastric emptying study in 2014   GERD (gastroesophageal reflux disease)    Headache    Pneumonia    2013ish   PONV (postoperative nausea and vomiting)     likes scopolamine patch and zofran /phenergan helps   Pre-diabetes    PTSD (post-traumatic stress disorder)    PTSD (post-traumatic stress disorder)    Sleep apnea 07/01/2018   cpap optional, pt close to not use cpap   Vitamin D deficiency    Wears glasses    Wears glasses    Past Surgical History:  Procedure Laterality Date   ANKLE ARTHROSCOPY Left 2011   CESAREAN SECTION  06/2006   x 1   CHOLECYSTECTOMY  07/2006   laparoscopic   CYSTOSCOPY N/A 02/04/2021   Procedure: CYSTOSCOPY;  Surgeon: Salvadore Dom, MD;  Location: [facility];  Service: Gynecology;  Laterality: N/A;   DILATION  AND CURETTAGE OF UTERUS  1997   x 2   FOREIGN BODY REMOVAL Left 11/18/2016   Procedure: REMOVAL FOREIGN BODY EXTREMITY LEFT FOOT;  Surgeon: Trula Slade, DPM;  Location: [facility];  Service: Podiatry;  Laterality: Left;   PILONIDAL CYST EXCISION  1990's   RADIOLOGY WITH ANESTHESIA N/A 11/10/2018   Procedure: MRI WITH ANESTHESIA OF BRAIN AND ORBITS WITH AND WITHOUT CONTRAST;  Surgeon: Radiologist, Medication, MD;  Location: [facility];  Service: Radiology;  Laterality: N/A;   TENDON REPAIR Left 2011   Left Ankle   TOTAL LAPAROSCOPIC HYSTERECTOMY WITH SALPINGECTOMY Bilateral 02/04/2021   Procedure: TOTAL LAPAROSCOPIC HYSTERECTOMY WITH SALPINGECTOMY;  Surgeon: Salvadore Dom, MD;  Location: [facility];  Service: Gynecology;  Laterality: Bilateral;   WISDOM TOOTH EXTRACTION  1990's   Social History   Socioeconomic History   Marital status: Married    Spouse name: Not on file   Number of children: 2   Years of education: Not on file   Highest education level: Bachelor's degree (e.g., BA, AB, BS)  Occupational History   Occupation: Programmer, multimedia: [employer]  Tobacco Use   Smoking status: Former    Packs/day: 2.00    Years: 10.00    Pack years: 20.00    Types: Cigarettes  Quit date: 09/14/2000    Years since quitting: 21.0   Smokeless tobacco: Never  Vaping Use   Vaping Use: Never used  Substance and Sexual Activity   Alcohol use: Not Currently   Drug use: No   Sexual activity: Yes    Partners: Male  Other Topics Concern   Not on file  Social History Narrative   Marital status:  Married in 02/2015      Children: 1 son (9); 1 stepdaughter (8)      Lives: with husband, son, stepdaughter joint      Employment: RN at [employer]; 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      Seatbelt: 100%; no texting while driving often       Social Determinants of Adult nurse Strain: Not on file  Food Insecurity: Not on file  Transportation Needs: Not on file  Physical Activity: Not on file  Stress: Not on file  Social Connections: Not on file   Family History  Problem Relation Age of Onset   Cancer Mother        squamous cell carcinoma   Hyperlipidemia Mother    Depression Mother    Anxiety disorder Mother    Obesity Mother    Heart disease Father 38       cardiomegaly, CHF; steroid use.   Hyperlipidemia Father    Hypertension Father    Mental retardation Father    High blood pressure Father    Depression Father    Anxiety disorder Father    Obesity Father    Rheum arthritis Sister    Multiple sclerosis Sister    Autoimmune disease Sister    Breast cancer Maternal Aunt    Thyroid cancer Paternal Aunt    Cancer Maternal Grandmother    Diabetes Maternal Grandmother    Heart disease Maternal Grandmother    Hyperlipidemia Maternal Grandmother    Hypertension Maternal Grandmother    Stroke Maternal Grandmother    Heart disease Paternal Grandmother    Hypertension Paternal Grandmother    Heart disease Paternal Grandfather    Hyperlipidemia Paternal Grandfather    Mental illness Paternal Grandfather    Allergies  Allergen Reactions   Penicillins Shortness Of Breath and Rash    Did it involve swelling of the face/tongue/throat, SOB, or low BP? Yes Did it involve sudden or severe rash/hives, skin peeling, or any reaction on the inside of your mouth or nose? Yes Did you need to seek medical attention at a hospital or doctor's office? Yes When did it last happen?      childhood allergy If all above answers are "NO", may proceed with cephalosporin use.     Tamiflu [Oseltamivir Phosphate] Anaphylaxis   Cephalosporins Rash   Latex Rash   Prior to Admission medications   Medication Sig Start Date End Date Taking? Authorizing Provider  celecoxib (CELEBREX) 200 MG capsule Take 1 capsule by mouth twice a day 08/22/21  Yes    cetirizine (ZYRTEC) 10 MG tablet Take 10 mg by mouth at bedtime.   Yes [provider]  clonazePAM (KLONOPIN) 0.5 MG tablet Take 1 tablet (0.5 mg total) by mouth every 6 (six) hours as needed for anxiety. 05/01/21  Yes Donnal Moat T, PA-C  esomeprazole (NEXIUM) 20 MG capsule Take 20  mg by mouth at bedtime.   Yes [provider]  fluvoxaMINE (LUVOX) 100 MG tablet Take 1 tablet (100 mg total) by mouth at bedtime. Take with 50 mg dose to equal 150 mg at bedtime 09/01/21  Yes Hurst, Teresa T, PA-C  fluvoxaMINE (LUVOX) 50 MG tablet TAKE 1 TABLET BY MOUTH AT BEDTIME. TAKE IN ADDITION TO THE 100 MG TABLET TO EQUAL 150 MG TOTAL 05/06/21  Yes Hurst, Teresa T, PA-C  gabapentin (NEURONTIN) 300 MG capsule Take 2-3 capsules (600-900 mg total) by mouth at bedtime. 03/27/21  Yes Hurst, Dorothea Glassman, PA-C  ibuprofen (ADVIL) 800 MG tablet Take 1 tablet (800 mg total) by mouth every 8 (eight) hours as needed. 02/04/21  Yes Salvadore Dom, MD  metFORMIN (GLUCOPHAGE) 500 MG tablet Take 1 tablet (500 mg total) by mouth 2 (two) times daily with a meal. Patient taking differently: Take 500 mg by mouth daily with breakfast. 05/22/21  Yes Ghumman, Ramandeep, NP  prazosin (MINIPRESS) 2 MG capsule Take 4 capsules (8 mg total) by mouth at bedtime. 07/31/21  Yes Hurst, Teresa T, PA-C  propranolol (INDERAL) 40 MG tablet TAKE 1 TABLET (40 MG TOTAL) BY MOUTH 2 (TWO) TIMES DAILY. Patient taking differently: Take 40 mg by mouth 2 (two) times daily. 10/03/20 10/10/21 Yes Hurst, Dorothea Glassman, PA-C  QUEtiapine (SEROQUEL) 200 MG tablet Take 1/2-1 tablet (100-200 mg total) by mouth at bedtime. 05/06/21  Yes Hurst, Helene Kelp T, PA-C  QUEtiapine (SEROQUEL) 300 MG tablet Take 1 tablet (300 mg total) by mouth at bedtime. 05/06/21  Yes Donnal Moat T, PA-C  triamcinolone (NASACORT) 55 MCG/ACT AERO nasal inhaler Place 1 spray into the nose at bedtime.   Yes [provider]  Carboxymethylcellul-Glycerin (LUBRICATING EYE DROPS OP)  Place 1 drop into both eyes daily as needed (dryness/ allergies).    [provider]  Cholecalciferol (VITAMIN D3) 5000 units CAPS Take 5,000 Units by mouth at bedtime.    [provider]  COVID-19 At Home Antigen Test [facility] COVID-19 HOME TEST) KIT Use as directed within package instructions 08/18/21   Edmon Crape, [facility]  cyclobenzaprine (FEXMID) 7.5 MG tablet Take 1 tablet by mouth at bedtime as needed. 09/03/21     docusate sodium (COLACE) 100 MG capsule Take 1 capsule (100 mg total) by mouth 2 (two) times daily. 02/04/21   Salvadore Dom, MD  meloxicam (MOBIC) 7.5 MG tablet Take 1 tablet (7.5 mg total) by mouth 2 (two) times daily. 08/14/21     methocarbamol (ROBAXIN) 500 MG tablet Take 1 tablet by mouth at bedtime as needed for muscle spasms 09/04/21       ROS: All other systems have been reviewed and were otherwise negative with the exception of those mentioned in the HPI and as above.  Physical Exam: General: Alert, no acute distress Cardiovascular: No pedal edema Respiratory: No cyanosis, no use of accessory musculature GI: No organomegaly, abdomen is soft and non-tender Skin: No lesions in the area of chief complaint Neurologic: Sensation intact distally Psychiatric: Patient is competent for consent with normal mood and affect Lymphatic: No axillary or cervical lymphadenopathy  MUSCULOSKELETAL:  Right knee: The range of motion of the right knee is 0-90 degrees. She is tender to palpation about the medial joint. Distal motor and sensory flexion intact. She has a positive McMurray's.   Imaging: MRI of the right knee demonstrates a medial meniscal root tear  Assessment: RIGHT KNEE MENICUS INJURY, CHONDROMALACIA PATELLA  Plan: Plan for Procedure(s): KNEE ARTHROSCOPY  WITH MEDIAL MENISECTOMY CHONDROPLASTY  The risks benefits and alternatives were discussed with the patient including but not limited to the risks of nonoperative treatment, versus  surgical intervention including infection, bleeding, nerve injury,  blood clots, cardiopulmonary complications, morbidity, mortality, among others, and they were willing to proceed.   The patient acknowledged the explanation, agreed to proceed with the plan and consent was signed.   Operative Plan: Right knee scope with medial meniscal root repair versus meniscectomy Discharge Medications: Standard DVT Prophylaxis: Aspirin Physical Therapy: Outpatient PT Special Discharge needs: +/- knee immobilizer. [place], PA-C  09/09/2021 9:43 AM

## 2021-09-09 NOTE — Progress Notes (Signed)

## 2021-09-10 ENCOUNTER — Other Ambulatory Visit: Payer: Self-pay

## 2021-09-10 ENCOUNTER — Encounter (HOSPITAL_BASED_OUTPATIENT_CLINIC_OR_DEPARTMENT_OTHER)
Admission: RE | Disposition: A | Discharge status: Home / Self Care | Payer: Self-pay | Source: Home / Self Care | Attending: Orthopaedic Surgery

## 2021-09-10 ENCOUNTER — Encounter (HOSPITAL_BASED_OUTPATIENT_CLINIC_OR_DEPARTMENT_OTHER): Payer: Self-pay | Admitting: Orthopaedic Surgery

## 2021-09-10 ENCOUNTER — Ambulatory Visit (HOSPITAL_BASED_OUTPATIENT_CLINIC_OR_DEPARTMENT_OTHER)
Admission: RE | Admit: 2021-09-10 | Discharge: 2021-09-10 | Disposition: A | Discharge status: Home / Self Care | Payer: 59 | Attending: Orthopaedic Surgery | Admitting: Orthopaedic Surgery

## 2021-09-10 ENCOUNTER — Other Ambulatory Visit (HOSPITAL_COMMUNITY): Payer: Self-pay

## 2021-09-10 ENCOUNTER — Ambulatory Visit (HOSPITAL_BASED_OUTPATIENT_CLINIC_OR_DEPARTMENT_OTHER): Payer: 59 | Admitting: Anesthesiology

## 2021-09-10 DIAGNOSIS — R7303 Prediabetes: Secondary | ICD-10-CM

## 2021-09-10 DIAGNOSIS — K219 Gastro-esophageal reflux disease without esophagitis: Secondary | ICD-10-CM | POA: Insufficient documentation

## 2021-09-10 DIAGNOSIS — Z87891 Personal history of nicotine dependence: Secondary | ICD-10-CM | POA: Insufficient documentation

## 2021-09-10 DIAGNOSIS — Z6841 Body Mass Index (BMI) 40.0 and over, adult: Secondary | ICD-10-CM | POA: Diagnosis not present

## 2021-09-10 DIAGNOSIS — F32A Depression, unspecified: Secondary | ICD-10-CM | POA: Insufficient documentation

## 2021-09-10 DIAGNOSIS — G4730 Sleep apnea, unspecified: Secondary | ICD-10-CM | POA: Insufficient documentation

## 2021-09-10 DIAGNOSIS — F419 Anxiety disorder, unspecified: Secondary | ICD-10-CM | POA: Insufficient documentation

## 2021-09-10 DIAGNOSIS — M2241 Chondromalacia patellae, right knee: Secondary | ICD-10-CM | POA: Diagnosis not present

## 2021-09-10 DIAGNOSIS — X58XXXA Exposure to other specified factors, initial encounter: Secondary | ICD-10-CM | POA: Insufficient documentation

## 2021-09-10 DIAGNOSIS — F418 Other specified anxiety disorders: Secondary | ICD-10-CM | POA: Diagnosis not present

## 2021-09-10 DIAGNOSIS — S83241A Other tear of medial meniscus, current injury, right knee, initial encounter: Secondary | ICD-10-CM | POA: Insufficient documentation

## 2021-09-10 HISTORY — PX: KNEE ARTHROSCOPY WITH MEDIAL MENISECTOMY: SHX5651

## 2021-09-10 SURGERY — ARTHROSCOPY, KNEE, WITH MEDIAL MENISCECTOMY
Anesthesia: General | Site: Knee | Laterality: Right

## 2021-09-10 MED ORDER — FENTANYL CITRATE (PF) 100 MCG/2ML IJ SOLN
25.0000 ug | INTRAMUSCULAR | Status: DC | PRN
  Administered 2021-09-10 (×3): 50 ug via INTRAVENOUS

## 2021-09-10 MED ORDER — ONDANSETRON HCL 4 MG/2ML IJ SOLN
INTRAMUSCULAR | Status: DC | PRN
  Administered 2021-09-10: 4 mg via INTRAVENOUS

## 2021-09-10 MED ORDER — PROPOFOL 10 MG/ML IV BOLUS
INTRAVENOUS | Status: DC | PRN
  Administered 2021-09-10: 200 mg via INTRAVENOUS

## 2021-09-10 MED ORDER — LIDOCAINE 2% (20 MG/ML) 5 ML SYRINGE
INTRAMUSCULAR | Status: DC | PRN
  Administered 2021-09-10: 60 mg via INTRAVENOUS

## 2021-09-10 MED ORDER — FENTANYL CITRATE (PF) 100 MCG/2ML IJ SOLN
INTRAMUSCULAR | Status: AC
  Filled 2021-09-10: qty 2

## 2021-09-10 MED ORDER — BUPIVACAINE HCL (PF) 0.25 % IJ SOLN
INTRAMUSCULAR | Status: AC
  Filled 2021-09-10: qty 30

## 2021-09-10 MED ORDER — AMISULPRIDE (ANTIEMETIC) 5 MG/2ML IV SOLN
INTRAVENOUS | Status: AC
  Filled 2021-09-10: qty 4

## 2021-09-10 MED ORDER — FENTANYL CITRATE (PF) 100 MCG/2ML IJ SOLN
INTRAMUSCULAR | Status: DC | PRN
  Administered 2021-09-10 (×2): 50 ug via INTRAVENOUS
  Administered 2021-09-10: 100 ug via INTRAVENOUS

## 2021-09-10 MED ORDER — ACETAMINOPHEN 10 MG/ML IV SOLN
INTRAVENOUS | Status: AC
  Filled 2021-09-10: qty 100

## 2021-09-10 MED ORDER — ACETAMINOPHEN 160 MG/5ML PO SOLN: 1000.0000 mg | Freq: Once | ORAL | Status: DC | PRN

## 2021-09-10 MED ORDER — ASPIRIN 81 MG PO CHEW
81.0000 mg | CHEWABLE_TABLET | Freq: Two times a day (BID) | ORAL | 0 refills | Status: AC
  Filled 2021-09-10: qty 84, 42d supply, fill #0

## 2021-09-10 MED ORDER — OXYCODONE HCL 5 MG PO TABS
ORAL_TABLET | ORAL | Status: AC
  Filled 2021-09-10: qty 1

## 2021-09-10 MED ORDER — OXYCODONE HCL 5 MG PO TABS
5.0000 mg | ORAL_TABLET | Freq: Four times a day (QID) | ORAL | 0 refills | Status: AC | PRN
Start: 2021-09-10 — End: 2021-09-14
  Filled 2021-09-10: qty 24, 4d supply, fill #0

## 2021-09-10 MED ORDER — OXYCODONE HCL 5 MG PO TABS
5.0000 mg | ORAL_TABLET | Freq: Once | ORAL | Status: AC | PRN
  Administered 2021-09-10: 17:00:00 5 mg via ORAL

## 2021-09-10 MED ORDER — DEXAMETHASONE SODIUM PHOSPHATE 10 MG/ML IJ SOLN
INTRAMUSCULAR | Status: AC
  Filled 2021-09-10: qty 1

## 2021-09-10 MED ORDER — MIDAZOLAM HCL 5 MG/5ML IJ SOLN
INTRAMUSCULAR | Status: DC | PRN
  Administered 2021-09-10: 2 mg via INTRAVENOUS

## 2021-09-10 MED ORDER — LACTATED RINGERS IV SOLN: INTRAVENOUS | Status: DC

## 2021-09-10 MED ORDER — CLINDAMYCIN PHOSPHATE 900 MG/50ML IV SOLN
INTRAVENOUS | Status: AC
  Filled 2021-09-10: qty 50

## 2021-09-10 MED ORDER — AMISULPRIDE (ANTIEMETIC) 5 MG/2ML IV SOLN
10.0000 mg | Freq: Once | INTRAVENOUS | Status: AC
  Administered 2021-09-10: 16:00:00 10 mg via INTRAVENOUS

## 2021-09-10 MED ORDER — ACETAMINOPHEN 10 MG/ML IV SOLN
1000.0000 mg | Freq: Once | INTRAVENOUS | Status: DC | PRN
  Administered 2021-09-10: 16:00:00 1000 mg via INTRAVENOUS

## 2021-09-10 MED ORDER — PROPOFOL 500 MG/50ML IV EMUL
INTRAVENOUS | Status: AC
  Filled 2021-09-10: qty 50

## 2021-09-10 MED ORDER — EPHEDRINE SULFATE 50 MG/ML IJ SOLN
INTRAMUSCULAR | Status: DC | PRN
  Administered 2021-09-10: 10 mg via INTRAVENOUS

## 2021-09-10 MED ORDER — ONDANSETRON HCL 4 MG PO TABS
4.0000 mg | ORAL_TABLET | Freq: Three times a day (TID) | ORAL | 0 refills | Status: AC | PRN
  Filled 2021-09-10: qty 10, 4d supply, fill #0

## 2021-09-10 MED ORDER — DEXAMETHASONE SODIUM PHOSPHATE 10 MG/ML IJ SOLN
INTRAMUSCULAR | Status: DC | PRN
  Administered 2021-09-10: 4 mg via INTRAVENOUS

## 2021-09-10 MED ORDER — ACETAMINOPHEN 500 MG PO TABS: 1000.0000 mg | ORAL_TABLET | Freq: Once | ORAL | Status: DC | PRN

## 2021-09-10 MED ORDER — LIDOCAINE 2% (20 MG/ML) 5 ML SYRINGE
INTRAMUSCULAR | Status: AC
  Filled 2021-09-10: qty 5

## 2021-09-10 MED ORDER — ACETAMINOPHEN 500 MG PO TABS
1000.0000 mg | ORAL_TABLET | Freq: Three times a day (TID) | ORAL | 0 refills | Status: AC
Start: 2021-09-10 — End: 2021-09-24
  Filled 2021-09-10: qty 84, 14d supply, fill #0

## 2021-09-10 MED ORDER — MIDAZOLAM HCL 2 MG/2ML IJ SOLN
INTRAMUSCULAR | Status: AC
  Filled 2021-09-10: qty 2

## 2021-09-10 MED ORDER — OXYCODONE HCL 5 MG/5ML PO SOLN: 5.0000 mg | Freq: Once | ORAL | Status: AC | PRN

## 2021-09-10 MED ORDER — BUPIVACAINE HCL (PF) 0.25 % IJ SOLN
INTRAMUSCULAR | Status: DC | PRN
  Administered 2021-09-10: 30 mL

## 2021-09-10 MED ORDER — CELECOXIB 200 MG PO CAPS
200.0000 mg | ORAL_CAPSULE | Freq: Two times a day (BID) | ORAL | 0 refills | Status: AC
  Filled 2021-09-10: qty 60, 30d supply, fill #0

## 2021-09-10 MED ORDER — CLINDAMYCIN PHOSPHATE 900 MG/50ML IV SOLN
900.0000 mg | INTRAVENOUS | Status: AC
  Administered 2021-09-10: 14:00:00 900 mg via INTRAVENOUS

## 2021-09-10 MED ORDER — SODIUM CHLORIDE 0.9 % IR SOLN
Status: DC | PRN
  Administered 2021-09-10: 3000 mL

## 2021-09-10 SURGICAL SUPPLY — 49 items
APL PRP STRL LF DISP 70% ISPRP (MISCELLANEOUS) ×2
BANDAGE ESMARK 6X9 LF (GAUZE/BANDAGES/DRESSINGS) IMPLANT
BLADE SHAVER BONE 5.0MM X 13CM (MISCELLANEOUS) ×1
BLADE SHAVER BONE 5.0X13 (MISCELLANEOUS) ×2 IMPLANT
BNDG CMPR 9X6 STRL LF SNTH (GAUZE/BANDAGES/DRESSINGS)
BNDG ELASTIC 6X5.8 VLCR STR LF (GAUZE/BANDAGES/DRESSINGS) ×3 IMPLANT
BNDG ESMARK 6X9 LF (GAUZE/BANDAGES/DRESSINGS)
BURR OVAL 8 FLU 4.0MM X 13CM (MISCELLANEOUS)
BURR OVAL 8 FLU 4.0X13 (MISCELLANEOUS) IMPLANT
CHLORAPREP W/TINT 26 (MISCELLANEOUS) ×4 IMPLANT
CLOSURE STERI-STRIP 1/2X4 (GAUZE/BANDAGES/DRESSINGS) ×1
CLSR STERI-STRIP ANTIMIC 1/2X4 (GAUZE/BANDAGES/DRESSINGS) ×3 IMPLANT
CUFF TOURN SGL QUICK 34 (TOURNIQUET CUFF)
CUFF TRNQT CYL 34X4.125X (TOURNIQUET CUFF) IMPLANT
CUTTER TENSIONER SUT 2-0 0 FBW (INSTRUMENTS) IMPLANT
DISSECTOR 4.0MMX13CM CVD (MISCELLANEOUS) ×4 IMPLANT
DRAPE ARTHROSCOPY W/POUCH 90 (DRAPES) ×4 IMPLANT
DRAPE IMP U-DRAPE 54X76 (DRAPES) IMPLANT
DRAPE U-SHAPE 47X51 STRL (DRAPES) ×4 IMPLANT
ELECT REM PT RETURN 9FT ADLT (ELECTROSURGICAL) ×4
ELECTRODE REM PT RTRN 9FT ADLT (ELECTROSURGICAL) ×1 IMPLANT
FIBERSTICK 2 (SUTURE) IMPLANT
GAUZE SPONGE 4X4 12PLY STRL (GAUZE/BANDAGES/DRESSINGS) IMPLANT
GLOVE SRG 8 PF TXTR STRL LF DI (GLOVE) ×2 IMPLANT
GLOVE SURG ENC MOIS LTX SZ6.5 (GLOVE) ×1 IMPLANT
GLOVE SURG LTX SZ8 (GLOVE) ×2 IMPLANT
GLOVE SURG POLYISO LF SZ6.5 (GLOVE) ×9 IMPLANT
GLOVE SURG SYN 8.0 (GLOVE) ×8 IMPLANT
GLOVE SURG SYN 8.0 PF PI (GLOVE) IMPLANT
GLOVE SURG UNDER POLY LF SZ6.5 (GLOVE) ×4 IMPLANT
GLOVE SURG UNDER POLY LF SZ8 (GLOVE) ×4
GOWN STRL REUS W/ TWL LRG LVL3 (GOWN DISPOSABLE) ×4 IMPLANT
GOWN STRL REUS W/TWL LRG LVL3 (GOWN DISPOSABLE) ×8
GOWN STRL REUS W/TWL XL LVL3 (GOWN DISPOSABLE) ×4 IMPLANT
IMMOBILIZER KNEE 24 THIGH 36 (MISCELLANEOUS) ×1 IMPLANT
IMMOBILIZER KNEE 24 UNIV (MISCELLANEOUS) ×4
KIT ROOT REPAIR MEINISCAL PEEK (Anchor) ×1 IMPLANT
KIT TURNOVER KIT B (KITS) ×4 IMPLANT
MANIFOLD NEPTUNE II (INSTRUMENTS) IMPLANT
MEINISCAL ROOT REPAIR KIT PEEK (Anchor) ×4 IMPLANT
NS IRRIG 1000ML POUR BTL (IV SOLUTION) IMPLANT
PACK ARTHROSCOPY DSU (CUSTOM PROCEDURE TRAY) ×4 IMPLANT
PAD COLD SHLDR WRAP-ON (PAD) ×3 IMPLANT
PADDING CAST COTTON 6X4 STRL (CAST SUPPLIES) IMPLANT
PORT APPOLLO RF 90DEGREE MULTI (SURGICAL WAND) IMPLANT
SLEEVE SCD COMPRESS KNEE MED (STOCKING) ×4 IMPLANT
SUT MNCRL AB 4-0 PS2 18 (SUTURE) ×4 IMPLANT
TOWEL GREEN STERILE FF (TOWEL DISPOSABLE) ×8 IMPLANT
TUBING ARTHROSCOPY IRRIG 16FT (MISCELLANEOUS) ×4 IMPLANT

## 2021-09-10 NOTE — Transfer of Care (Signed)
Immediate Anesthesia Transfer of Care Note  Patient: Caroline Gonzales  Procedure(s) Performed: KNEE ARTHROSCOPY WITH MEDIAL MENISCAL ROOT REPAIR (Right: Knee) CHONDROPLASTY (Right: Knee)  Patient Location: PACU  Anesthesia Type:General  Level of Consciousness: sedated  Airway & Oxygen Therapy: Patient Spontanous Breathing and Patient connected to face mask oxygen  Post-op Assessment: Report given to RN and Post -op Vital signs reviewed and stable  Post vital signs: Reviewed and stable  Last Vitals:  Vitals Value Taken Time  BP 145/92 09/10/21 1521  Temp    Pulse 72 09/10/21 1522  Resp 18 09/10/21 1522  SpO2 95 % 09/10/21 1522  Vitals shown include unvalidated device data.  Last Pain:  Vitals:   09/10/21 1144  TempSrc: Oral  PainSc: 2       Patients Stated Pain Goal: 5 (70/92/95 7473)  Complications: No notable events documented.

## 2021-09-10 NOTE — Anesthesia Preprocedure Evaluation (Addendum)
Anesthesia Evaluation  Patient identified by MRN, date of birth, ID band Patient awake    Reviewed: Allergy & Precautions, NPO status , Patient's Chart, lab work & pertinent test results, reviewed documented beta blocker date and time   History of Anesthesia Complications (+) PONV and history of anesthetic complications  Airway Mallampati: II  TM Distance: >3 FB Neck ROM: Full    Dental  (+) Teeth Intact, Dental Advisory Given   Pulmonary sleep apnea , former smoker,    breath sounds clear to auscultation       Cardiovascular negative cardio ROS   Rhythm:Regular     Neuro/Psych PSYCHIATRIC DISORDERS Anxiety Depression negative neurological ROS     GI/Hepatic Neg liver ROS, GERD  ,  Endo/Other  Morbid obesity  Renal/GU negative Renal ROS     Musculoskeletal RIGHT KNEE MENICUS INJURY, CHONDROMALACIA PATELLA   Abdominal   Peds  Hematology  (+) anemia , Hgb 11.8   Anesthesia Other Findings   Reproductive/Obstetrics negative OB ROS                             Anesthesia Physical Anesthesia Plan  ASA: 3  Anesthesia Plan: General   Post-op Pain Management: Toradol IV (intra-op) and Ofirmev IV (intra-op)   Induction:   PONV Risk Score and Plan: 4 or greater and Ondansetron, Dexamethasone, Propofol infusion, TIVA and Midazolam  Airway Management Planned: LMA  Additional Equipment: None  Intra-op Plan:   Post-operative Plan: Extubation in OR  Informed Consent: I have reviewed the patients History and Physical, chart, labs and discussed the procedure including the risks, benefits and alternatives for the proposed anesthesia with the patient or authorized representative who has indicated his/her understanding and acceptance.     Dental advisory given  Plan Discussed with: CRNA and Anesthesiologist  Anesthesia Plan Comments:        Anesthesia Quick Evaluation

## 2021-09-10 NOTE — Anesthesia Procedure Notes (Signed)
Procedure Name: LMA Insertion Date/Time: 09/10/2021 2:18 PM Performed by: Maryella Shivers, CRNA Pre-anesthesia Checklist: Patient identified, Emergency Drugs available, Suction available and Patient being monitored Patient Re-evaluated:Patient Re-evaluated prior to induction Oxygen Delivery Method: Circle system utilized Preoxygenation: Pre-oxygenation with 100% oxygen Induction Type: IV induction Ventilation: Mask ventilation without difficulty LMA: LMA inserted LMA Size: 4.0 Number of attempts: 1 Airway Equipment and Method: Bite block Placement Confirmation: positive ETCO2 Tube secured with: Tape Dental Injury: Teeth and Oropharynx as per pre-operative assessment

## 2021-09-10 NOTE — Interval H&P Note (Signed)
All questions answered, patient wants to proceed with procedure. ? ?

## 2021-09-10 NOTE — Anesthesia Postprocedure Evaluation (Signed)
Anesthesia Post Note  Patient: Caroline Gonzales  Procedure(s) Performed: KNEE ARTHROSCOPY WITH MEDIAL MENISCAL ROOT REPAIR (Right: Knee)     Patient location during evaluation: PACU Anesthesia Type: General Level of consciousness: awake and alert Pain management: pain level controlled Vital Signs Assessment: post-procedure vital signs reviewed and stable Respiratory status: spontaneous breathing, nonlabored ventilation and respiratory function stable Cardiovascular status: blood pressure returned to baseline and stable Postop Assessment: no apparent nausea or vomiting Anesthetic complications: no   No notable events documented.  Last Vitals:  Vitals:   09/10/21 1523 09/10/21 1530  BP:  140/69  Pulse: 72 69  Resp: 18 18  Temp:    SpO2: 95% 98%    Last Pain:  Vitals:   09/10/21 1522  TempSrc:   PainSc: Asleep                 Lynda Rainwater

## 2021-09-10 NOTE — Op Note (Signed)
Orthopaedic Surgery Operative Note (CSN: [id number])  Tirzah Fross [place]  1976-04-11 Date of Surgery: 09/10/2021   Diagnoses:  Right medial meniscal root tear  Procedure: Right medial root repair   Operative Finding Exam under anesthesia: full motion no limitation Suprapatellar pouch:normal Patellofemoral Compartment:normal Medial Compartment:distinct root tear, no other meniscal pathology, grade 1 chagnes anterior portion of MFC Lateral Compartment: normal Intercondylar Notch: normal  Successful completion of the planned procedure.  Good repair and clear root instability.  Though the patients BMI makes her higher risk for failure of the repair we felt that we could significantly lessen her chances at rapid progressive arthritis as without his repair she would have had essentially a functional medial complete meniscectomy  Post-operative plan: The patient will be NWB for 3 weeks with progressive WB after per meniscal root protocol.  The patient will be dc home.  DVT prophylaxis Aspirin 81 mg twice daily for 6 weeks.  Pain control with PRN pain medication preferring oral medicines.  Follow up plan will be scheduled in approximately 7 days for incision check.  Post-Op Diagnosis: Same Surgeons:Primary: Hiram Gash, MD Assistants:Caroline McBane PA-C Location: [facility] OR ROOM 2 Anesthesia: General with local Antibiotics: Ancef 2 g with local vancomycin powder 1 g at the surgical site Tourniquet time: * No tourniquets in log * Estimated Blood Loss: Minimal Complications: None Specimens: None Implants: Implant Name Type Inv. Item Serial No. Manufacturer Lot No. LRB No. Used Action  MEINISCAL ROOT REPAIR KIT PEEK - [id number] Anchor MEINISCAL ROOT REPAIR KIT PEEK  [employer] [id number] Right 1 Implanted    Indications for Surgery:   ANQUANETTE BAHNER is a 45 y.o. female with medial knee pain and MRI demonstrating medial root tear.  Benefits and risks of operative and nonoperative  management were discussed prior to surgery with patient/guardian(s) and informed consent form was completed.  Specific risks including infection, need for additional surgery, retear, stiffness, postop arthrosis   Procedure:   The patient was identified properly. Informed consent was obtained and the surgical site was marked. The patient was taken up to suite where general anesthesia was induced. The patient was placed in the supine position with a post against the surgical leg and a nonsterile tourniquet applied. The surgical leg was then prepped and draped usual sterile fashion.  A standard surgical timeout was performed.  2 standard anterior portals were made and diagnostic arthroscopy performed. Please note the findings as noted above.  We identified that there was a posterior medial meniscal root tear.  We identified that the meniscus would be appropriate for repair.  We trephinated the MCL to avoid damage to the cartilage and to provide better access to the medial compartment.  We are careful not to release the entire MCL.  Once we had appropriate visualization we proceeded with our repair.  We used a meniscal scorpion to pass 2 fiber link sutures in a luggage loop fashion obtaining purchase of the meniscus.  We then used a meniscal root guide from [employer] to place a 6 mm tunnel 10 mm deep at the anatomic location of the posterior medial meniscus root.  We shuttled our fiber link sutures through this tunnel ensuring no tissue bridge at the medial portal.  We then fixed the tension while visualizing through the arthroscope using a 4.75 peek swivel lock in the anteromedial tibia.  Good fixation was obtained.  Anatomic reduction was noted.  Incisions closed with absorbable suture. The patient was awoken from general anesthesia and taken to  the PACU in stable condition without complication.   Noemi Chapel, PA-C, present and scrubbed throughout the case, critical for completion in a timely fashion,  and for retraction, instrumentation, closure.

## 2021-09-11 ENCOUNTER — Encounter (HOSPITAL_BASED_OUTPATIENT_CLINIC_OR_DEPARTMENT_OTHER): Payer: Self-pay | Admitting: Orthopaedic Surgery

## 2021-09-18 ENCOUNTER — Encounter: Payer: Self-pay | Admitting: Physician Assistant

## 2021-09-18 ENCOUNTER — Telehealth (INDEPENDENT_AMBULATORY_CARE_PROVIDER_SITE_OTHER): Payer: 59 | Admitting: Physician Assistant

## 2021-09-18 DIAGNOSIS — F3341 Major depressive disorder, recurrent, in partial remission: Secondary | ICD-10-CM | POA: Diagnosis not present

## 2021-09-18 DIAGNOSIS — G4733 Obstructive sleep apnea (adult) (pediatric): Secondary | ICD-10-CM | POA: Diagnosis not present

## 2021-09-18 DIAGNOSIS — F411 Generalized anxiety disorder: Secondary | ICD-10-CM | POA: Diagnosis not present

## 2021-09-18 DIAGNOSIS — F4310 Post-traumatic stress disorder, unspecified: Secondary | ICD-10-CM | POA: Diagnosis not present

## 2021-09-18 DIAGNOSIS — G4700 Insomnia, unspecified: Secondary | ICD-10-CM

## 2021-09-18 NOTE — Progress Notes (Signed)
[facility]  Patient ID: Caroline Gonzales,  MRN: [id number]  PCP: Pcp, No  Date of Evaluation: 09/18/2021 Time spent:40 minutes  Chief Complaint:  Chief Complaint   Anxiety; Depression; Follow-up    Virtual Visit via Telehealth  I connected with patient by a video enabled telemedicine application with their informed consent, and verified patient privacy and that I am speaking with the correct person using two identifiers.  I am private, in my office and the patient is at home.  I discussed the limitations, risks, security and privacy concerns of performing an evaluation and management service by video and the availability of in person appointments. I also discussed with the patient that there may be a patient responsible charge related to this service. The patient expressed understanding and agreed to proceed.   I discussed the assessment and treatment plan with the patient. The patient was provided an opportunity to ask questions and all were answered. The patient agreed with the plan and demonstrated an understanding of the instructions.   The patient was advised to call back or seek an in-person evaluation if the symptoms worsen or if the condition fails to improve as anticipated.  I provided  40 minutes of non-face-to-face time during this encounter.   HISTORY/CURRENT STATUS: HPI for routine follow-up.  Had been doing well w/ mental health until she had surgery last week.  It has been a little bit of a struggle with having to accept help since she is bedbound except bathroom privileges only.  She does not feel that others want to help her sometimes but she also understands that part of that is her feeling that way, not necessarily that her family does not want to help her.  Before the surgery her mental health was really good and she was even considering asking to decrease the dose of 1 or more of her medications.  But she will have a long road ahead of her with therapy  so feels like it is not smart to make any changes at this point.   Patient denies loss of interest in usual activities and is able to enjoy things.  Denies decreased energy or motivation.  Appetite has not changed.  No extreme sadness, tearfulness, or feelings of hopelessness.  Denies any changes in concentration, making decisions or remembering things.  She was not missing any work because of her mental health.  Has been sleeping well and not having nightmares.  Her husband has told her she wakes him up yelling but she does not remember any of it.  That happens a couple of times a week.  She does talk in her sleep and she wonders if he may just be hearing her talking and when he wakes up out of a dead sleep he feels like she may be screaming.  Denies suicidal or homicidal thoughts.  She still has a lot of anxiety but not like it used to be.  The Klonopin is still helpful when she needs it.  She sometimes feels like she may have a panic attack if she does not take it.  She is still in therapy regularly which is always helpful.  Patient denies increased energy with decreased need for sleep, no increased talkativeness, no racing thoughts, no impulsivity or risky behaviors, no increased spending, no increased libido, no grandiosity, no increased irritability or anger, and no hallucinations.  Review of Systems  Constitutional: Negative.   HENT: Negative.    Eyes: Negative.   Respiratory: Negative.  Cardiovascular: Negative.   Gastrointestinal: Negative.   Genitourinary: Negative.   Musculoskeletal:        See HPI.  Skin: Negative.   Neurological: Negative.   Endo/Heme/Allergies: Negative.   Psychiatric/Behavioral:         See HPI.    Individual Medical History/ Review of Systems: Changes? :Yes   had torn meniscus repair last week, right knee. She was sitting in a chair, had foot planted and turned, which caused it.   Past medications for mental health diagnoses include: Prozac, Paxil,  Effexor, Norpramin, Wellbutrin, doxepin, Elavil, Geodon, Abilify, Lexapro, Trileptal, Ambien, Lunesta, Sonata, trazodone, Restoril, Remeron, Seroquel, Klonopin, propranolol, Lamictal, Deplin, Latuda, Xanax made her feel horrible, Ativan didn't help, prazosin    Allergies: Penicillins, Tamiflu [oseltamivir phosphate], Cephalosporins, and Latex  Current Medications:  Current Outpatient Medications:    acetaminophen (TYLENOL) 500 MG tablet, Take 2 tablets (1,000 mg total) by mouth every 8 (eight) hours, Disp: 84 tablet, Rfl: 0   aspirin (ASPIRIN CHILDRENS) 81 MG chewable tablet, Chew 1 tablet (81 mg total) by mouth 2 (two) times daily for 6 weeks for DVT prophylaxis after surgery, Disp: 84 tablet, Rfl: 0   Carboxymethylcellul-Glycerin (LUBRICATING EYE DROPS OP), Place 1 drop into both eyes daily as needed (dryness/ allergies)., Disp: , Rfl:    celecoxib (CELEBREX) 200 MG capsule, Take 1 capsule (200 mg total) by mouth 2 (two) times daily for 2 weeks. Then take as needed, Disp: 60 capsule, Rfl: 0   cetirizine (ZYRTEC) 10 MG tablet, Take 10 mg by mouth at bedtime., Disp: , Rfl:    Cholecalciferol (VITAMIN D3) 5000 units CAPS, Take 5,000 Units by mouth at bedtime., Disp: , Rfl:    clonazePAM (KLONOPIN) 0.5 MG tablet, Take 1 tablet (0.5 mg total) by mouth every 6 (six) hours as needed for anxiety., Disp: 120 tablet, Rfl: 5   COVID-19 At Home Antigen Test (CARESTART COVID-19 HOME TEST) KIT, Use as directed within package instructions, Disp: 4 each, Rfl: 0   docusate sodium (COLACE) 100 MG capsule, Take 1 capsule (100 mg total) by mouth 2 (two) times daily., Disp: 10 capsule, Rfl: 0   esomeprazole (NEXIUM) 20 MG capsule, Take 20 mg by mouth at bedtime., Disp: , Rfl:    fluvoxaMINE (LUVOX) 100 MG tablet, Take 1 tablet (100 mg total) by mouth at bedtime. Take with 50 mg dose to equal 150 mg at bedtime, Disp: 90 tablet, Rfl: 1   fluvoxaMINE (LUVOX) 50 MG tablet, TAKE 1 TABLET BY MOUTH AT BEDTIME. TAKE IN  ADDITION TO THE 100 MG TABLET TO EQUAL 150 MG TOTAL, Disp: 90 tablet, Rfl: 1   gabapentin (NEURONTIN) 300 MG capsule, Take 2-3 capsules (600-900 mg total) by mouth at bedtime., Disp: 270 capsule, Rfl: 0   metFORMIN (GLUCOPHAGE) 500 MG tablet, Take 1 tablet (500 mg total) by mouth 2 (two) times daily with a meal. (Patient taking differently: Take 500 mg by mouth daily with breakfast.), Disp: 60 tablet, Rfl: 11   prazosin (MINIPRESS) 2 MG capsule, Take 4 capsules (8 mg total) by mouth at bedtime., Disp: 360 capsule, Rfl: 1   propranolol (INDERAL) 40 MG tablet, TAKE 1 TABLET (40 MG TOTAL) BY MOUTH 2 (TWO) TIMES DAILY. (Patient taking differently: Take 40 mg by mouth 2 (two) times daily.), Disp: 180 tablet, Rfl: 3   QUEtiapine (SEROQUEL) 200 MG tablet, Take 1/2-1 tablet (100-200 mg total) by mouth at bedtime. (Patient taking differently: Take 200 mg by mouth at bedtime.), Disp: 90 tablet, Rfl: 1  QUEtiapine (SEROQUEL) 300 MG tablet, Take 1 tablet (300 mg total) by mouth at bedtime., Disp: 90 tablet, Rfl: 1   triamcinolone (NASACORT) 55 MCG/ACT AERO nasal inhaler, Place 1 spray into the nose at bedtime., Disp: , Rfl:    cyclobenzaprine (FEXMID) 7.5 MG tablet, Take 1 tablet by mouth at bedtime as needed. (Patient not taking: Reported on 09/18/2021), Disp: 20 tablet, Rfl: 0   methocarbamol (ROBAXIN) 500 MG tablet, Take 1 tablet by mouth at bedtime as needed for muscle spasms (Patient not taking: Reported on 09/18/2021), Disp: 20 tablet, Rfl: 0 Medication Side Effects: none  Family Medical/ Social History: Changes? Is now working in [place] at the [employer].    MENTAL HEALTH EXAM:  Last menstrual period 01/06/2021.There is no height or weight on file to calculate BMI.  General Appearance: Casual, Neat, Well Groomed and Obese  Eye Contact:  Good  Speech:  Clear and Coherent and Normal Rate  Volume:  Normal  Mood:  Euthymic  Affect:  Appropriate  Thought Process:  Goal Directed and  Descriptions of Associations: Circumstantial  Orientation:  Full (Time, Place, and Person)  Thought Content: Logical   Suicidal Thoughts:  No  Homicidal Thoughts:  No  Memory:  WNL  Judgement:  Good  Insight:  Good  Psychomotor Activity:  Normal  Concentration:  Concentration: Good and Attention Span: Good  Recall:  Good  Fund of Knowledge: Good  Language: Good  Assets:  Desire for Improvement  ADL's:  Intact  Cognition: WNL  Prognosis:  Good   09/09/2021 BMP glucose 126, all other values normal   DIAGNOSES:    ICD-10-CM   1. PTSD (post-traumatic stress disorder)  F43.10     2. Recurrent major depressive disorder, in partial remission ([place])  F33.41     3. Generalized anxiety disorder  F41.1     4. Insomnia, unspecified type  G47.00     5. Obstructive sleep apnea  G47.33         Receiving Psychotherapy: Yes Heather Mask   RECOMMENDATIONS:  PDMP was reviewed.  Last Klonopin filled 08/18/2021. I provided 40 minutes of non-face-to-face time during this encounter, including time spent before and after the visit in records review, medical decision making, counseling pertinent to today's visit, and charting.  She is doing well with her mental health so no changes will be made.  I agree that as she is recovering from the surgery it is not a good time to try to decrease doses. Continue Klonopin 0.5 mg, 1 p.o. 4 times daily as needed. Continue Luvox 150 mg nightly. Continue gabapentin 300 mg, 2-3 p.o. q. evening.  (She usually only takes 2.) Continue prazosin 2 mg, 4 nightly. Continue Seroquel 500 mg nightly. Continue counseling with Heather Mask. Return in 6 months.  Donnal Moat, PA-C

## 2021-09-19 DIAGNOSIS — S83241D Other tear of medial meniscus, current injury, right knee, subsequent encounter: Secondary | ICD-10-CM | POA: Diagnosis not present

## 2021-09-25 ENCOUNTER — Other Ambulatory Visit: Payer: Self-pay

## 2021-09-25 ENCOUNTER — Ambulatory Visit (HOSPITAL_BASED_OUTPATIENT_CLINIC_OR_DEPARTMENT_OTHER): Payer: 59 | Attending: Orthopaedic Surgery | Admitting: Physical Therapy

## 2021-09-25 ENCOUNTER — Encounter (HOSPITAL_BASED_OUTPATIENT_CLINIC_OR_DEPARTMENT_OTHER): Payer: Self-pay | Admitting: Physical Therapy

## 2021-09-25 DIAGNOSIS — M6281 Muscle weakness (generalized): Secondary | ICD-10-CM | POA: Insufficient documentation

## 2021-09-25 DIAGNOSIS — M25561 Pain in right knee: Secondary | ICD-10-CM | POA: Insufficient documentation

## 2021-09-25 DIAGNOSIS — M25661 Stiffness of right knee, not elsewhere classified: Secondary | ICD-10-CM | POA: Insufficient documentation

## 2021-09-25 DIAGNOSIS — Z9889 Other specified postprocedural states: Secondary | ICD-10-CM | POA: Diagnosis not present

## 2021-09-25 DIAGNOSIS — R262 Difficulty in walking, not elsewhere classified: Secondary | ICD-10-CM | POA: Diagnosis not present

## 2021-09-25 NOTE — Therapy (Signed)
OUTPATIENT PHYSICAL THERAPY LOWER EXTREMITY EVALUATION   Patient Name: DOXIE AUGENSTEIN MRN: [id number] DOB:[birth year], 46 y.o., female Today's Date: 09/25/2021   PT End of Session - 09/25/21 1238     Visit Number 1    Number of Visits 21    Date for PT Re-Evaluation 12/24/21    Authorization Type [place]    PT Start Time 1145    PT Stop Time 1230    PT Time Calculation (min) 45 min    Activity Tolerance Patient tolerated treatment well    Behavior During Therapy [facility] for tasks assessed/performed             Past Medical History:  Diagnosis Date   Allergy    Zyrtec.   Anxiety    followed by Dr. Toy Cookey   Back pain    Complication of anesthesia    Depression    Fibroid    Gastroparesis 09/14/2012   gastric emptying study in 2014   GERD (gastroesophageal reflux disease)    Headache    Pneumonia    2013ish   PONV (postoperative nausea and vomiting)     likes scopolamine patch and zofran /phenergan helps   Pre-diabetes    PTSD (post-traumatic stress disorder)    PTSD (post-traumatic stress disorder)    Sleep apnea 07/01/2018   cpap optional, pt close to not use cpap   Vitamin D deficiency    Wears glasses    Wears glasses    Past Surgical History:  Procedure Laterality Date   ANKLE ARTHROSCOPY Left 2011   CESAREAN SECTION  06/2006   x 1   CHOLECYSTECTOMY  07/2006   laparoscopic   CYSTOSCOPY N/A 02/04/2021   Procedure: CYSTOSCOPY;  Surgeon: Salvadore Dom, MD;  Location: [facility];  Service: Gynecology;  Laterality: N/A;   DILATION AND CURETTAGE OF UTERUS  1997   x 2   FOREIGN BODY REMOVAL Left 11/18/2016   Procedure: REMOVAL FOREIGN BODY EXTREMITY LEFT FOOT;  Surgeon: Trula Slade, DPM;  Location: [facility];  Service: Podiatry;  Laterality: Left;   KNEE ARTHROSCOPY WITH MEDIAL MENISECTOMY Right 09/10/2021   Procedure: KNEE ARTHROSCOPY WITH MEDIAL MENISCAL ROOT REPAIR;  Surgeon: Hiram Gash, MD;  Location: [facility];  Service:  Orthopedics;  Laterality: Right;   PILONIDAL CYST EXCISION  1990's   RADIOLOGY WITH ANESTHESIA N/A 11/10/2018   Procedure: MRI WITH ANESTHESIA OF BRAIN AND ORBITS WITH AND WITHOUT CONTRAST;  Surgeon: Radiologist, Medication, MD;  Location: [facility];  Service: Radiology;  Laterality: N/A;   TENDON REPAIR Left 2011   Left Ankle   TOTAL LAPAROSCOPIC HYSTERECTOMY WITH SALPINGECTOMY Bilateral 02/04/2021   Procedure: TOTAL LAPAROSCOPIC HYSTERECTOMY WITH SALPINGECTOMY;  Surgeon: Salvadore Dom, MD;  Location: [facility];  Service: Gynecology;  Laterality: Bilateral;   WISDOM TOOTH EXTRACTION  1990's   Patient Active Problem List   Diagnosis Date Noted   Status post laparoscopic hysterectomy 02/04/2021   S/P laparoscopic hysterectomy 02/04/2021   PTSD (post-traumatic stress disorder) 07/21/2018   GAD (generalized anxiety disorder) 07/21/2018   Insomnia 07/21/2018   MDD (major depressive disorder) 07/21/2018   Nightmares 07/21/2018   Insulin resistance 10/21/2017   Other hyperlipidemia 10/07/2017   Vitamin D deficiency 10/07/2017   Class 3 obesity with serious comorbidity and body mass index (BMI) of 40.0 to 44.9 in adult 04/27/2017   Residual foreign body in soft tissue 11/12/2016   Prediabetes 11/02/2016   Morbid obesity ([place]) 10/15/2016    PCP: Pcp, No  REFERRING PROVIDER: Hiram Gash, MD  REFERRING DIAG: Right knee arthroscopy with medial meniscal root repair on 12/28  THERAPY DIAG:  Pain in joint of right knee  Stiffness of right knee, not elsewhere classified  Muscle weakness (generalized)  Difficulty walking  ONSET DATE: 09/10/2021  SUBJECTIVE:   SUBJECTIVE STATEMENT: Pt stats the original injury happened on Jul 16, 2021 when she spun in her office chair and it started to hurt. Then stepping off the curb one day, she started having significant pain. Pt does use the cryo sleeve at night. She uses a shower bench and walker at home. Surgery was on 12/28. Pt states the "pain is  gone but it is just achey and sore." She is able to get into the shower once she has set up help from family. She has been trying to maintain WB precautions.   Incisions look clean and dry at this time without signs of infection. Pt does not swelling. She has tried to maintain ankles and is taking aspirin BID to avoid clotting.   PERTINENT HISTORY: No previous injury to R knee  PAIN:  Are you having pain? Yes NPRS scale: 2/10 Pain location: R medial knee, ant and posterior Pain orientation: Right  PAIN TYPE: aching, burning, and sore Pain description: intermittent  Aggravating factors: bending, standing on it too long  Relieving factors: meds,   PRECAUTIONS: Knee PROM 0-90 (0-6 weeks)  WEIGHT BEARING RESTRICTIONS Yes TTWB  per [facility] protocol (0-6 weeks)  FALLS:  Has patient fallen in last 6 months? 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 High-grade radial tear of the medial meniscal posterior horn adjacent to the root attachment site. 2. Small focal area of chondral fissuring along the medial trochlea inferiorly. 3. Small joint effusion  PATIENT SURVEYS:  FOTO 31 62 at D/C 19 pts MCII  COGNITION:  Overall cognitive status: Within functional limits for tasks assessed     SENSATION:  Light touch: Appears intact  POSTURE:  Stands in hyper extension in L knee; WFL  PALPATION: Incisions- clean, dry with steri strips in place. No drainage or erythema noted Mild edema in peri-patellar pouches   LE AROM/PROM:  A/PROM Right 09/25/2021 Left 09/25/2021  Knee flexion  Up to 90 per protocol WNL  Knee extension 2  5   (Blank rows = not  tested)  LE MMT: untested due to surgical precautions  FUNCTIONAL TESTS:  STS transfers: pt able to maintain WB precautions and safety with use of table and bilat axillary crutches  GAIT: Distance walked: 22ft Assistive device utilized: Crutches- sized appropriately Level of assistance: Modified independence Comments: TTWB on R LE- able to maintain precautions  2 steps: ascend and descend with good safety- edu given about sequencing and technique    TODAY'S TREATMENT:  Exercises Supine Quad Set - 2 x daily - 7 x weekly - 3 sets - 10 reps - 5 hold Active Straight Leg Raise with Quad Set - 2 x daily - 7 x weekly - 2 sets - 10 reps Sitting Heel Slide with Towel - 2 x daily - 7 x weekly - 1 sets - 10 reps - 5 hold  PATIENT EDUCATION:  Education details: protocol, precautions, safety, MOI, diagnosis, prognosis, anatomy, exercise progression, DOMS expectations, muscle firing,  envelope of function, HEP, POC  Person educated: Patient Education method: Explanation, Demonstration, Tactile cues, Verbal cues, and Handouts Education comprehension: verbalized understanding, returned demonstration, verbal cues required, and tactile cues required   HOME EXERCISE PROGRAM: Access Code: [email or web address] URL: [email or web address] Date: 09/25/2021 Prepared by: Daleen Bo  ASSESSMENT:  CLINICAL IMPRESSION: Patient is a 46 y.o. female who was seen today for physical therapy evaluation and treatment for post op R knee meniscal root repair and arthroscopy. Pt with ROM already at protocol limits. Pt with well controlled pain and ability to maintain precautions. Plan to see pt at reduced frequency after first 3 weeks until 6 weeks/2nd phase of rehab. Objective impairments include Abnormal gait, decreased activity tolerance, decreased balance, decreased endurance, decreased mobility, difficulty walking, decreased ROM, decreased strength, hypomobility, increased edema, increased fascial  restrictions, increased muscle spasms, impaired flexibility, improper body mechanics, postural dysfunction, obesity, and pain. These impairments are limiting patient from cleaning, community activity, driving, meal prep, occupation, laundry, yard work, shopping, and exercise . Personal factors including Fitness, Profession, Time since onset of injury/illness/exacerbation, and 1-2 comorbidities:    are also affecting patient's functional outcome. Patient will benefit from skilled PT to address above impairments and improve overall function.  REHAB POTENTIAL: Good  CLINICAL DECISION MAKING: Stable/uncomplicated  EVALUATION COMPLEXITY: Low   GOALS:  SHORT TERM GOALS:  STG Name Target Date Goal status  1 Pt will become independent with HEP in order to demonstrate synthesis of PT education.  Baseline:  11/06/2021 INITIAL  2 Pt will score at least 19 pt increase on FOTO to demonstrate functional improvement in MCII and pt perceived function.   Baseline:  11/06/2021 INITIAL  3 Pt will be able to demonstrate ability to walk with FWB in order to demonstrate functional improvement in LE function for self-care and house hold duties.  Baseline: 11/06/2021 INITIAL   LONG TERM GOALS:   LTG Name Target Date Goal status  1 Pt  will become independent with final HEP in order to demonstrate synthesis of PT education.   12/18/2021 INITIAL  2 Pt will score >/= 62 on FOTO to demonstrate improvement in perceived R LE function.  12/18/2021 INITIAL  3 Pt will be able to perform 5XSTS in under 12s  in order to demonstrate functional improvement above the cut off score for adults.    12/18/2021 INITIAL  4 Pt will be able to demonstrate/report ability to walk >15 mins without pain in order to demonstrate functional improvement and tolerance to exercise and community mobility.   12/18/2021 INITIAL   PLAN: PT FREQUENCY: 1-2x/week  PT DURATION: 12 weeks  PLANNED INTERVENTIONS: Therapeutic exercises, Therapeutic  activity, Neuro Muscular re-education, Balance training, Gait training, Patient/Family education, Joint mobilization, Stair training, Orthotic/Fit training, DME instructions, Aquatic Therapy, Dry Needling, Electrical stimulation, Spinal mobilization, Cryotherapy, Moist heat, Taping, Vasopneumatic device, Traction, Ultrasound, Ionotophoresis 4mg /ml Dexamethasone, and Manual therapy  PLAN FOR NEXT SESSION: review HEP, patellar mobs, check incisions, SLR in all planes  Daleen Bo PT, DPT 09/25/21 12:49 PM

## 2021-09-30 ENCOUNTER — Other Ambulatory Visit (HOSPITAL_COMMUNITY): Payer: Self-pay

## 2021-09-30 ENCOUNTER — Encounter (HOSPITAL_BASED_OUTPATIENT_CLINIC_OR_DEPARTMENT_OTHER): Payer: Self-pay | Admitting: Nurse Practitioner

## 2021-09-30 ENCOUNTER — Other Ambulatory Visit: Payer: Self-pay

## 2021-09-30 ENCOUNTER — Ambulatory Visit (HOSPITAL_BASED_OUTPATIENT_CLINIC_OR_DEPARTMENT_OTHER): Payer: 59 | Admitting: Nurse Practitioner

## 2021-09-30 VITALS — BP 116/78 | Ht 65.0 in | Wt 303.9 lb

## 2021-09-30 DIAGNOSIS — E7849 Other hyperlipidemia: Secondary | ICD-10-CM

## 2021-09-30 DIAGNOSIS — R7303 Prediabetes: Secondary | ICD-10-CM | POA: Diagnosis not present

## 2021-09-30 DIAGNOSIS — Z6841 Body Mass Index (BMI) 40.0 and over, adult: Secondary | ICD-10-CM | POA: Diagnosis not present

## 2021-09-30 DIAGNOSIS — E781 Pure hyperglyceridemia: Secondary | ICD-10-CM

## 2021-09-30 DIAGNOSIS — F3341 Major depressive disorder, recurrent, in partial remission: Secondary | ICD-10-CM | POA: Diagnosis not present

## 2021-09-30 LAB — HEMOGLOBIN A1C
Est. average glucose Bld gHb Est-mCnc: 131 mg/dL
Hgb A1c MFr Bld: 6.2 % — ABNORMAL HIGH (ref 4.8–5.6)

## 2021-09-30 MED ORDER — SEMAGLUTIDE (1 MG/DOSE) 4 MG/3ML ~~LOC~~ SOPN
1.0000 mg | PEN_INJECTOR | SUBCUTANEOUS | 3 refills | Status: DC
  Filled 2021-09-30: qty 3, 28d supply, fill #0
  Filled 2021-12-01: qty 9, 84d supply, fill #0
  Filled 2022-02-22: qty 9, 84d supply, fill #1

## 2021-09-30 MED ORDER — OZEMPIC (0.25 OR 0.5 MG/DOSE) 2 MG/1.5ML ~~LOC~~ SOPN
PEN_INJECTOR | SUBCUTANEOUS | 0 refills | Status: AC
  Filled 2021-09-30: qty 1.5, 42d supply, fill #0

## 2021-09-30 MED ORDER — FREESTYLE LIBRE 3 SENSOR MISC
1.0000 [IU] | 11 refills | Status: DC
  Filled 2021-09-30: qty 2, 28d supply, fill #0
  Filled 2021-10-27: qty 2, 28d supply, fill #1
  Filled 2021-12-01: qty 2, 28d supply, fill #2
  Filled 2021-12-26: qty 2, 28d supply, fill #3
  Filled 2022-01-21: qty 2, 28d supply, fill #4
  Filled 2022-02-13: qty 2, 28d supply, fill #5

## 2021-09-30 MED ORDER — SEMAGLUTIDE(0.25 OR 0.5MG/DOS) 2 MG/1.5ML ~~LOC~~ SOPN
0.5000 mg | PEN_INJECTOR | SUBCUTANEOUS | 0 refills | Status: DC
  Filled 2021-09-30 – 2021-11-06 (×2): qty 1.5, 28d supply, fill #0

## 2021-09-30 MED ORDER — ONDANSETRON 8 MG PO TBDP
8.0000 mg | ORAL_TABLET | Freq: Three times a day (TID) | ORAL | 3 refills | Status: DC | PRN
Start: 2021-09-30 — End: 2021-12-29
  Filled 2021-09-30: qty 20, 7d supply, fill #0
  Filled 2021-12-15: qty 20, 7d supply, fill #1

## 2021-09-30 NOTE — Assessment & Plan Note (Signed)
BMI today 50.57. Patient is eager to take control of her weight and initiate healthy lifestyle options for her overall health. Discussed medication options that may be helpful to patient in addition to diet and activity recommendations. A joint decision was made to trial GLP-1 medications as this would also benefit her cardiovascular health as well as insulin resistance and prediabetes. Education on medication and how to take provided today.  Education on side effects and how to avoid these. We will plan to follow-up in approximately 3 months for further evaluation.

## 2021-09-30 NOTE — Assessment & Plan Note (Signed)
History of elevated lipids and triglycerides on recent labs. Given the presence of other comorbidities joint decision was made to begin GLP-1 medication for overall cardiovascular and blood sugar management. We will plan to follow-up in 3 months with labs.

## 2021-09-30 NOTE — Progress Notes (Addendum)
[name], DNP, AGNP-c [facility] [address]   [place], [place] [address] [phone]  New patient visit   Patient: Caroline Gonzales   DOB: Mar 02, 1976   46 y.o. Female  MRN: [id number] Visit Date: 09/30/2021  Patient Care Team: , Coralee Pesa, NP as PCP - General (Nurse Practitioner)  Today's healthcare provider: [name], NP   Chief Complaint  Patient presents with   Establish Care   Obesity   Subjective    HPI  Caroline Gonzales is a 46 y.o. female who presents today as a new patient to establish care.    Samora recently had meniscal repair on her right knee and is doing well following the surgery.  She is currently still in a brace and is ambulating with a walker today.  She does have concerns today with increased weight gain.  She reports she began gaining weight about the age of 46 years old and is struggled throughout her entire teenage and adult life with weight management issues.  She has tried dietary and exercise changes however these have not been very effective.  In the past she did see healthy weight and wellness however found it challenging to return to the clinic as often as needed in order to continue to get her medications.  She did see some results with a combination of metformin and Topamax and felt that this worked well for her.  Most recently she was seen by primary care provider and found to have elevated lipids and prediabetes.  At that time she was started back on metformin.  She would like to discuss this today and receive recommendations on medication options that may be beneficial to help improve her health.  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  She also has contributing factors with use of Seroquel and high doses that may be affecting her ability to lose weight.  Past Medical  History:  Diagnosis Date   Allergy    Zyrtec.   Anxiety    followed by Dr. Toy Cookey   Back pain    Complication of anesthesia    Depression    Fibroid    Gastroparesis 09/14/2012   gastric emptying study in 2014   GERD (gastroesophageal reflux disease)    Headache    Pneumonia    2013ish   PONV (postoperative nausea and vomiting)     likes scopolamine patch and zofran /phenergan helps   Pre-diabetes    PTSD (post-traumatic stress disorder)    PTSD (post-traumatic stress disorder)    Sleep apnea 07/01/2018   cpap optional, pt close to not use cpap   Vitamin D deficiency    Wears glasses    Wears glasses    Past Surgical History:  Procedure Laterality Date   ABDOMINAL HYSTERECTOMY  01/2021   ANKLE ARTHROSCOPY Left 2011   CESAREAN SECTION  06/2006   x 1   CHOLECYSTECTOMY  07/2006   laparoscopic   CYSTOSCOPY N/A 02/04/2021   Procedure: CYSTOSCOPY;  Surgeon: Salvadore Dom, MD;  Location: [facility];  Service: Gynecology;  Laterality: N/A;   DILATION AND CURETTAGE OF UTERUS  1997   x 2   FOREIGN BODY REMOVAL Left 11/18/2016   Procedure: REMOVAL FOREIGN BODY EXTREMITY LEFT FOOT;  Surgeon: Trula Slade, DPM;  Location: [facility];  Service:  Podiatry;  Laterality: Left;   KNEE ARTHROSCOPY WITH MEDIAL MENISECTOMY Right 09/10/2021   Procedure: KNEE ARTHROSCOPY WITH MEDIAL MENISCAL ROOT REPAIR;  Surgeon: Hiram Gash, MD;  Location: [facility];  Service: Orthopedics;  Laterality: Right;   PILONIDAL CYST EXCISION  1990's   RADIOLOGY WITH ANESTHESIA N/A 11/10/2018   Procedure: MRI WITH ANESTHESIA OF BRAIN AND ORBITS WITH AND WITHOUT CONTRAST;  Surgeon: Radiologist, Medication, MD;  Location: [facility];  Service: Radiology;  Laterality: N/A;   TENDON REPAIR Left 2011   Left Ankle   TOTAL LAPAROSCOPIC HYSTERECTOMY WITH SALPINGECTOMY Bilateral 02/04/2021   Procedure: TOTAL LAPAROSCOPIC HYSTERECTOMY WITH SALPINGECTOMY;  Surgeon: Salvadore Dom, MD;  Location: [facility];   Service: Gynecology;  Laterality: Bilateral;   WISDOM TOOTH EXTRACTION  1990's   Family Status  Relation Name Status   Mother Janann Colonel Alive       60   Father Ollen Gross Alive       60   Sister Leigh Ann Alive   Mat Aunt  (Not Specified)   Ethlyn Daniels  (Not Specified)   MGM Irma Deceased   MGF  Deceased   PGM Margie Deceased   PGF Reuben Deceased   Family History  Problem Relation Age of Onset   Cancer Mother        squamous cell carcinoma   Hyperlipidemia Mother    Depression Mother    Anxiety disorder Mother    Obesity Mother    Heart disease Father        cardiomegaly, CHF; steroid use.   Hyperlipidemia Father    Hypertension Father    Mental retardation Father    High blood pressure Father    Depression Father    Anxiety disorder Father    Obesity Father    Rheum arthritis Sister    Multiple sclerosis Sister    Autoimmune disease Sister    Drug abuse Sister    Breast cancer Maternal Aunt    Thyroid cancer Paternal Aunt    Cancer Maternal Grandmother    Diabetes Maternal Grandmother    Heart disease Maternal Grandmother    Hyperlipidemia Maternal Grandmother    Hypertension Maternal Grandmother    Stroke Maternal Grandmother    Heart disease Paternal Grandmother    Hypertension Paternal Grandmother    Heart disease Paternal Grandfather    Hyperlipidemia Paternal Grandfather    Mental illness Paternal Grandfather    Social History   Socioeconomic History   Marital status: Married    Spouse name: Not on file   Number of children: 2   Years of education: Not on file   Highest education level: Bachelor's degree (e.g., BA, AB, BS)  Occupational History   Occupation: Programmer, multimedia: [employer]  Tobacco Use   Smoking status: Former    Packs/day: 2.00    Years: 10.00    Pack years: 20.00    Types: Cigarettes    Quit date: 09/14/2000    Years since quitting: 21.0   Smokeless tobacco: Never  Vaping Use   Vaping Use: Never used  Substance and Sexual Activity    Alcohol use: Not Currently   Drug use: No   Sexual activity: Yes    Partners: Male    Birth control/protection: Other-see comments    Comment: Post-hysterectomy  Other Topics Concern   Not on file  Social History Narrative   Marital status:  Married in 02/2015      Children: 1 son (9); 1  stepdaughter (8)      Lives: with husband, son, stepdaughter joint      Employment: RN at [employer]; 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      Seatbelt: 100%; no texting while driving often       Social Determinants of Radio broadcast assistant Strain: Not on file  Food Insecurity: Not on file  Transportation Needs: Not on file  Physical Activity: Not on file  Stress: Not on file  Social Connections: Not on file   Outpatient Medications Prior to Visit  Medication Sig   aspirin (ASPIRIN CHILDRENS) 81 MG chewable tablet Chew 1 tablet (81 mg total) by mouth 2 (two) times daily for 6 weeks for DVT prophylaxis after surgery   celecoxib (CELEBREX) 200 MG capsule Take 1 capsule (200 mg total) by mouth 2 (two) times daily for 2 weeks. Then take as needed   cetirizine (ZYRTEC) 10 MG tablet Take 10 mg by mouth at bedtime.   Cholecalciferol (VITAMIN D3) 5000 units CAPS Take 5,000 Units by mouth at bedtime.   clonazePAM (KLONOPIN) 0.5 MG tablet Take 1 tablet (0.5 mg total) by mouth every 6 (six) hours as needed for anxiety.   esomeprazole (NEXIUM) 20 MG capsule Take 20 mg by mouth at bedtime.   fluvoxaMINE (LUVOX) 100 MG tablet Take 1 tablet (100 mg total) by mouth at bedtime. Take with 50 mg dose to equal 150 mg at bedtime   fluvoxaMINE (LUVOX) 50 MG tablet TAKE 1 TABLET BY MOUTH AT BEDTIME. TAKE IN ADDITION TO THE 100 MG TABLET TO EQUAL 150 MG TOTAL   metFORMIN (GLUCOPHAGE) 500 MG tablet Take 1 tablet (500 mg total) by mouth 2 (two) times daily with a meal. (Patient taking  differently: Take 500 mg by mouth daily with breakfast.)   Multiple Vitamins-Minerals (HAIR SKIN AND NAILS FORMULA PO)    propranolol (INDERAL) 40 MG tablet TAKE 1 TABLET (40 MG TOTAL) BY MOUTH 2 (TWO) TIMES DAILY. (Patient taking differently: Take 40 mg by mouth 2 (two) times daily.)   QUEtiapine (SEROQUEL) 200 MG tablet Take 1/2-1 tablet (100-200 mg total) by mouth at bedtime. (Patient taking differently: Take 200 mg by mouth at bedtime.)   QUEtiapine (SEROQUEL) 300 MG tablet Take 1 tablet (300 mg total) by mouth at bedtime.   triamcinolone (NASACORT) 55 MCG/ACT AERO nasal inhaler Place 1 spray into the nose at bedtime.   Carboxymethylcellul-Glycerin (LUBRICATING EYE DROPS OP) Place 1 drop into both eyes daily as needed (dryness/ allergies).   gabapentin (NEURONTIN) 300 MG capsule Take 2-3 capsules (600-900 mg total) by mouth at bedtime. (Patient not taking: Reported on 09/30/2021)   methocarbamol (ROBAXIN) 500 MG tablet Take 1 tablet by mouth at bedtime as needed for muscle spasms (Patient not taking: Reported on 09/18/2021)   prazosin (MINIPRESS) 2 MG capsule Take 4 capsules (8 mg total) by mouth at bedtime.   [DISCONTINUED] COVID-19 At Home Antigen Test (CARESTART COVID-19 HOME TEST) KIT Use as directed within package instructions   [DISCONTINUED] cyclobenzaprine (FEXMID) 7.5 MG tablet Take 1 tablet by mouth at bedtime as needed. (Patient not taking: Reported on 09/18/2021)   [DISCONTINUED] docusate sodium (COLACE) 100 MG capsule Take 1 capsule (100 mg total) by mouth 2 (two) times daily.   No facility-administered medications prior to visit.  Allergies  Allergen Reactions   Penicillins Shortness Of Breath and Rash    Did it involve swelling of the face/tongue/throat, SOB, or low BP? Yes Did it involve sudden or severe rash/hives, skin peeling, or any reaction on the inside of your mouth or nose? Yes Did you need to seek medical attention at a hospital or doctor's office? Yes When did it last  happen?      childhood allergy If all above answers are "NO", may proceed with cephalosporin use.     Tamiflu [Oseltamivir Phosphate] Anaphylaxis   Cephalosporins Rash   Latex Rash    Immunization History  Administered Date(s) Administered   Influenza Split 06/15/2015, 06/08/2016   Influenza-Unspecified 06/07/2017, 06/03/2018, 06/09/2019, 06/07/2020   PFIZER Comirnaty(Gray Top)Covid-19 Tri-Sucrose Vaccine 02/23/2021   PFIZER(Purple Top)SARS-COV-2 Vaccination 09/06/2019, 09/25/2019, 05/15/2020   PPD Test 06/24/2018   Tdap 09/15/2015    Health Maintenance  Topic Date Due   Pneumococcal Vaccine 42-53 Years old (1 - PCV) Never done   COLONOSCOPY (Pts 45-90yr Insurance coverage will need to be confirmed)  Never done   PAP SMEAR-Modifier  01/02/2024   TETANUS/TDAP  09/14/2025   INFLUENZA VACCINE  Completed   COVID-19 Vaccine  Completed   Hepatitis C Screening  Completed   HIV Screening  Completed   HPV VACCINES  Aged Out    Patient Care Team: , SCoralee Pesa NP as PCP - General (Nurse Practitioner)  Review of Systems All review of systems negative except what is listed in the HPI   Objective    BP 116/78    Ht 5' 5" (1.651 m)    Wt (!) 303 lb 14.4 oz (137.8 kg)    LMP 01/06/2021    BMI 50.57 kg/m  Physical Exam Vitals and nursing note reviewed.  Constitutional:      Appearance: Normal appearance. She is obese.  HENT:     Head: Normocephalic.  Eyes:     Extraocular Movements: Extraocular movements intact.     Conjunctiva/sclera: Conjunctivae normal.     Pupils: Pupils are equal, round, and reactive to light.  Cardiovascular:     Rate and Rhythm: Normal rate and regular rhythm.     Pulses: Normal pulses.     Heart sounds: Normal heart sounds.  Pulmonary:     Effort: Pulmonary effort is normal.     Breath sounds: Normal breath sounds.  Abdominal:     General: Bowel sounds are normal. There is no distension.     Palpations: Abdomen is soft.     Tenderness: There is  no abdominal tenderness.  Musculoskeletal:     Comments: Brace present on right lower extremity due to recent meniscal surgery  Skin:    General: Skin is warm and dry.     Capillary Refill: Capillary refill takes less than 2 seconds.  Neurological:     General: No focal deficit present.     Mental Status: She is alert and oriented to person, place, and time.  Psychiatric:        Mood and Affect: Mood normal.        Behavior: Behavior normal.        Thought Content: Thought content normal.        Judgment: Judgment normal.    Depression Screen PHQ 2/9 Scores 05/22/2021 03/29/2018 02/02/2018 04/14/2017  PHQ - 2 Score 0 1 0 0  PHQ- 9 Score 0 10 - -  Some encounter information is confidential and restricted. Go to Review Flowsheets activity to  see all data.   No results found for any visits on 09/30/21.  Assessment & Plan      Problem List Items Addressed This Visit     Morbid obesity with body mass index (BMI) of 50.0 to 59.9 in adult ([place])    BMI today 50.57. Patient is eager to take control of her weight and initiate healthy lifestyle options for her overall health. Discussed medication options that may be helpful to patient in addition to diet and activity recommendations. A joint decision was made to trial GLP-1 medications as this would also benefit her cardiovascular health as well as insulin resistance and prediabetes. Education on medication and how to take provided today.  Education on side effects and how to avoid these. We will plan to follow-up in approximately 3 months for further evaluation.      Relevant Medications   Semaglutide,0.25 or 0.5MG/DOS, (OZEMPIC, 0.25 OR 0.5 MG/DOSE,) 2 MG/1.5ML SOPN   Semaglutide,0.25 or 0.5MG/DOS, 2 MG/1.5ML SOPN   Semaglutide, 1 MG/DOSE, 4 MG/3ML SOPN   Continuous Blood Gluc Sensor (FREESTYLE LIBRE 3 SENSOR) MISC   ondansetron (ZOFRAN-ODT) 8 MG disintegrating tablet   Pre-diabetes - Primary    Most recent A1c does show higher end of  normal for prediabetes. Patient is concerned that her insulin resistance has worsened related to recent surgery and stresses that she has been experiencing. She does have significant comorbidities associated with elevated blood sugars and insulin resistance including morbid obesity and hyperlipidemia. At this time I feel that a GLP-1 medication would be the best option for management to help support her elevated blood glucose readings, insulin resistance, BMI, and hyperlipidemia. Also plan for continuous blood glucose monitor to help provide positive feedback for diet and activity changes that could benefit her overall health. We will obtain an updated A1c today for baseline evaluation and plan to follow-up in 3 months or sooner if needed.      Relevant Medications   Semaglutide,0.25 or 0.5MG/DOS, (OZEMPIC, 0.25 OR 0.5 MG/DOSE,) 2 MG/1.5ML SOPN   Semaglutide,0.25 or 0.5MG/DOS, 2 MG/1.5ML SOPN   Semaglutide, 1 MG/DOSE, 4 MG/3ML SOPN   Continuous Blood Gluc Sensor (FREESTYLE LIBRE 3 SENSOR) MISC   ondansetron (ZOFRAN-ODT) 8 MG disintegrating tablet   Other Relevant Orders   Hemoglobin A1c   Hypertriglyceridemia    History of elevated lipids and triglycerides on recent labs. Given the presence of other comorbidities joint decision was made to begin GLP-1 medication for overall cardiovascular and blood sugar management. We will plan to follow-up in 3 months with labs.      Relevant Medications   Semaglutide,0.25 or 0.5MG/DOS, (OZEMPIC, 0.25 OR 0.5 MG/DOSE,) 2 MG/1.5ML SOPN   Semaglutide,0.25 or 0.5MG/DOS, 2 MG/1.5ML SOPN   Semaglutide, 1 MG/DOSE, 4 MG/3ML SOPN   Continuous Blood Gluc Sensor (FREESTYLE LIBRE 3 SENSOR) MISC   Recurrent major depressive disorder, in partial remission ([place])    Currently managed by psychiatry.  Patient is doing well on her medication regimen and has no concerns today. Will follow along and monitor as needed.        Return in about 3 months (around  12/29/2021) for Blood sugar, weight.      , Coralee Pesa, NP, DNP, AGNP-C [facility] at [facility]

## 2021-09-30 NOTE — Assessment & Plan Note (Signed)
Most recent A1c does show higher end of normal for prediabetes. Patient is concerned that her insulin resistance has worsened related to recent surgery and stresses that she has been experiencing. She does have significant comorbidities associated with elevated blood sugars and insulin resistance including morbid obesity and hyperlipidemia. At this time I feel that a GLP-1 medication would be the best option for management to help support her elevated blood glucose readings, insulin resistance, BMI, and hyperlipidemia. Also plan for continuous blood glucose monitor to help provide positive feedback for diet and activity changes that could benefit her overall health. We will obtain an updated A1c today for baseline evaluation and plan to follow-up in 3 months or sooner if needed.

## 2021-09-30 NOTE — Patient Instructions (Addendum)
Thank you for choosing Caroline Gonzales at [facility] for your Primary Care needs. I am excited for the opportunity to partner with you to meet your health care goals. It was a pleasure meeting you today!  Recommendations from today's visit: Lets plan to come back in 3 months and see how you are doing!  Dont hesitate to contact me with questions or concerns.  I am sending in the prescriptions for the Ozempic, [employer], and Zofran for you.   Information on diet, exercise, and health maintenance recommendations are listed below. This is information to help you be sure you are on track for optimal health and monitoring.   Please look over this and let us know if you have any questions or if you have completed any of the health maintenance outside of [facility] so that we can be sure your records are up to date.  ___________________________________________________________ About Me: I am an Adult-Geriatric Nurse Practitioner with a background in caring for patients for more than 20 years with a strong intensive care background. I provide primary care and sports medicine services to patients age 17 and older within this office. My education had a strong focus on caring for the older adult population, which I am passionate about. I am also the director of the APP Fellowship with [facility].   My desire is to provide you with the best service through preventive medicine and supportive care. I consider you a part of the medical team and value your input. I work diligently to ensure that you are heard and your needs are met in a safe and effective manner. I want you to feel comfortable with me as your provider and want you to know that your health concerns are important to me.  For your information, our office hours are: Monday, Tuesday, and Thursday 8:00 AM - 5:00 PM Wednesday and Friday 8:00 AM - 12:00 PM.   In my time away from the office I am teaching new APP's within the  system and am unavailable, but my partner, Dr. Burnard Bunting is in the office for emergent needs.   If you have questions or concerns, please call our office at [phone] or send [name] a MyChart message and we will respond as quickly as possible.  ____________________________________________________________ MyChart:  For all urgent or time sensitive needs we ask that you please call the office to avoid delays. Our number is [phone]. MyChart is not constantly monitored and due to the large volume of messages a day, replies may take up to 72 business hours.  MyChart Policy: MyChart allows for you to see your visit notes, after visit summary, provider recommendations, lab and tests results, make an appointment, request refills, and contact your provider or the office for non-urgent questions or concerns. Providers are seeing patients during normal business hours and do not have built in time to review MyChart messages.  We ask that you allow a minimum of 3 business days for responses to [employer]. For this reason, please do not send urgent requests through [place]. Please call the office at [phone]. New and ongoing conditions may require a visit. We have virtual and in person visit available for your convenience.  Complex MyChart concerns may require a visit. Your provider may request you schedule a virtual or in person visit to ensure we are providing the best care possible. MyChart messages sent after 11:00 AM on Friday will not be received by the provider until Monday morning.  Lab and Test Results: °You will receive your lab and test results on MyChart as soon as they are completed and results have been sent by the lab or testing facility. Due to this service, you will receive your results BEFORE your provider.  °I review lab and tests results each morning prior to seeing patients. Some results require collaboration with other providers to ensure you are receiving the most appropriate  care. For this reason, we ask that you please allow a minimum of 3-5 business days from the time the ALL results have been received for your provider to receive and review lab and test results and contact you about these.  °Most lab and test result comments from the provider will be sent through MyChart. Your provider may recommend changes to the plan of care, follow-up visits, repeat testing, ask questions, or request an office visit to discuss these results. You may reply directly to this message or call the office at [phone] to provide information for the provider or set up an appointment. °In some instances, you will be called with test results and recommendations. Please let us know if this is preferred and we will make note of this in your chart to provide this for you.    °If you have not heard a response to your lab or test results in 5 business days from all results returning to MyChart, please call the office to let us know. We ask that you please avoid calling prior to this time unless there is an emergent concern. Due to high call volumes, this can delay the resulting process. ° °After Hours: °For all non-emergency after hours needs, please call the office at [phone] and select the option to reach the on-call provider service. On-call services are shared between multiple [place] offices and therefore it will not be possible to speak directly with your provider. On-call providers may provide medical advice and recommendations, but are unable to provide refills for maintenance medications.  °For all emergency or urgent medical needs after normal business hours, we recommend that you seek care at the closest Urgent Care or Emergency Department to ensure appropriate treatment in a timely manner.  °[facility] has a 24 hour emergency room located on the ground floor for your convenience.  ° °Urgent Concerns During the Business Day °Providers are seeing patients from 8AM to  5PM with a busy schedule and are most often not able to respond to non-urgent calls until the end of the day or the next business day. °If you should have URGENT concerns during the day, please call and speak to the nurse or schedule a same day appointment so that we can address your concern without delay.  ° °Thank you, again, for choosing me as your health care partner. I appreciate your trust and look forward to learning more about you.  ° °SaraBeth Aithana Kushner, DNP, AGNP-c °___________________________________________________________ ° °Health Maintenance Recommendations °Screening Testing °Mammogram °Every 1 -2 years based on history and risk factors °Starting at age 40 °Pap Smear °Ages 21-39 every 3 years °Ages 30-65 every 5 years with HPV testing °More frequent testing may be required based on results and history °Colon Cancer Screening °Every 1-10 years based on test performed, risk factors, and history °Starting at age 45 °Bone Density Screening °Every 2-10 years based on history °Starting at age 65 for women °Recommendations for men differ based on medication usage, history, and risk factors °AAA Screening °One time ultrasound °Men 65-75 years old who have   every smoked °Lung Cancer Screening °Low Dose Lung CT every 12 months °Age 55-80 years with a 30 pack-year smoking history who still smoke or who have quit within the last 15 years ° °Screening Labs °Routine  Labs: Complete Blood Count (CBC), Complete Metabolic Panel (CMP), Cholesterol (Lipid Panel) °Every 6-12 months based on history and medications °May be recommended more frequently based on current conditions or previous results °Hemoglobin A1c Lab °Every 3-12 months based on history and previous results °Starting at age 45 or earlier with diagnosis of diabetes, high cholesterol, BMI >26, and/or risk factors °Frequent monitoring for patients with diabetes to ensure blood sugar control °Thyroid Panel (TSH w/ T3 & T4) °Every 6 months based on history,  symptoms, and risk factors °May be repeated more often if on medication °HIV °One time testing for all patients 13 and older °May be repeated more frequently for patients with increased risk factors or exposure °Hepatitis C °One time testing for all patients 18 and older °May be repeated more frequently for patients with increased risk factors or exposure °Gonorrhea, Chlamydia °Every 12 months for all sexually active persons 13-24 years °Additional monitoring may be recommended for those who are considered high risk or who have symptoms °PSA °Men 40-54 years old with risk factors °Additional screening may be recommended from age 55-69 based on risk factors, symptoms, and history ° °Vaccine Recommendations °Tetanus Booster °All adults every 10 years °Flu Vaccine °All patients 6 months and older every year °COVID Vaccine °All patients 12 years and older °Initial dosing with booster °May recommend additional booster based on age and health history °HPV Vaccine °2 doses all patients age 9-26 °Dosing may be considered for patients over 26 °Shingles Vaccine (Shingrix) °2 doses all adults 55 years and older °Pneumonia (Pneumovax 23) °All adults 65 years and older °May recommend earlier dosing based on health history °Pneumonia (Prevnar 13) °All adults 65 years and older °Dosed 1 year after Pneumovax 23 ° °Additional Screening, Testing, and Vaccinations may be recommended on an individualized basis based on family history, health history, risk factors, and/or exposure.  °__________________________________________________________ ° °Diet Recommendations for All Patients ° °I recommend that all patients maintain a diet low in saturated fats, carbohydrates, and cholesterol. While this can be challenging at first, it is not impossible and small changes can make big differences.  °Things to try: °Decreasing the amount of soda, sweet tea, and/or juice to one or less per day and replace with water °While water is always the first  choice, if you do not like water you may consider °adding a water additive without sugar to improve the taste °other sugar free drinks °Replace potatoes with a brightly colored vegetable at dinner °Use healthy oils, such as canola oil or olive oil, instead of butter or hard margarine °Limit your bread intake to two pieces or less a day °Replace regular pasta with low carb pasta options °Bake, broil, or grill foods instead of frying °Monitor portion sizes  °Eat smaller, more frequent meals throughout the day instead of large meals ° °An important thing to remember is, if you love foods that are not great for your health, you don't have to give them up completely. Instead, allow these foods to be a reward when you have done well. Allowing yourself to still have special treats every once in a while is a nice way to tell yourself thank you for working hard to keep yourself healthy.  ° °Also remember that every day is a new day.   If you have a bad day and "fall off the wagon", you can still climb right back up and keep moving along on your journey! ° °We have resources available to help you!  °Some websites that may be helpful include: °www.MyPlate.gov  °Www.VeryWellFit.com °_____________________________________________________________ ° °Activity Recommendations for All Patients ° °I recommend that all adults get at least 20 minutes of moderate physical activity that elevates your heart rate at least 5 days out of the week.  °Some examples include: °Walking or jogging at a pace that allows you to carry on a conversation °Cycling (stationary bike or outdoors) °Water aerobics °Yoga °Weight lifting °Dancing °If physical limitations prevent you from putting stress on your joints, exercise in a pool or seated in a chair are excellent options. ° °Do determine your MAXIMUM heart rate for activity: YOUR AGE - 220 = MAX HeartRate  ° °Remember! °Do not push yourself too hard.  °Start slowly and build up your pace, speed, weight,  time in exercise, etc.  °Allow your body to rest between exercise and get good sleep. °You will need more water than normal when you are exerting yourself. Do not wait until you are thirsty to drink. Drink with a purpose of getting in at least 8, 8 ounce glasses of water a day plus more depending on how much you exercise and sweat.  ° ° °If you begin to develop dizziness, chest pain, abdominal pain, jaw pain, shortness of breath, headache, vision changes, lightheadedness, or other concerning symptoms, stop the activity and allow your body to rest. If your symptoms are severe, seek emergency evaluation immediately. If your symptoms are concerning, but not severe, please let us know so that we can recommend further evaluation.  ° ° ° °

## 2021-09-30 NOTE — Assessment & Plan Note (Signed)
Currently managed by psychiatry.  Patient is doing well on her medication regimen and has no concerns today. Will follow along and monitor as needed.

## 2021-10-01 DIAGNOSIS — F4310 Post-traumatic stress disorder, unspecified: Secondary | ICD-10-CM | POA: Diagnosis not present

## 2021-10-03 ENCOUNTER — Other Ambulatory Visit: Payer: Self-pay | Admitting: Physician Assistant

## 2021-10-06 ENCOUNTER — Encounter (HOSPITAL_BASED_OUTPATIENT_CLINIC_OR_DEPARTMENT_OTHER): Payer: Self-pay | Admitting: Nurse Practitioner

## 2021-10-06 NOTE — Telephone Encounter (Signed)
LVM to RC. ? If patient is still taking as prescribed.

## 2021-10-07 ENCOUNTER — Encounter (HOSPITAL_BASED_OUTPATIENT_CLINIC_OR_DEPARTMENT_OTHER): Payer: Self-pay | Admitting: Physical Therapy

## 2021-10-07 ENCOUNTER — Other Ambulatory Visit (HOSPITAL_COMMUNITY): Payer: Self-pay

## 2021-10-07 ENCOUNTER — Other Ambulatory Visit: Payer: Self-pay

## 2021-10-07 ENCOUNTER — Ambulatory Visit (HOSPITAL_BASED_OUTPATIENT_CLINIC_OR_DEPARTMENT_OTHER): Payer: 59 | Admitting: Physical Therapy

## 2021-10-07 DIAGNOSIS — M6281 Muscle weakness (generalized): Secondary | ICD-10-CM

## 2021-10-07 DIAGNOSIS — M25661 Stiffness of right knee, not elsewhere classified: Secondary | ICD-10-CM | POA: Diagnosis not present

## 2021-10-07 DIAGNOSIS — R262 Difficulty in walking, not elsewhere classified: Secondary | ICD-10-CM | POA: Diagnosis not present

## 2021-10-07 DIAGNOSIS — Z9889 Other specified postprocedural states: Secondary | ICD-10-CM | POA: Diagnosis not present

## 2021-10-07 DIAGNOSIS — M25561 Pain in right knee: Secondary | ICD-10-CM

## 2021-10-07 MED ORDER — GABAPENTIN 300 MG PO CAPS
600.0000 mg | ORAL_CAPSULE | Freq: Every day | ORAL | 0 refills | Status: DC
  Filled 2021-10-07 – 2021-10-15 (×2): qty 270, 90d supply, fill #0

## 2021-10-07 NOTE — Therapy (Signed)
OUTPATIENT PHYSICAL THERAPY LOWER EXTREMITY TREATMENT   Patient Name: Caroline Gonzales MRN: [id number] DOB:[birth year], 46 y.o., female Today's Date: 10/07/2021   PT End of Session - 10/07/21 1206     Visit Number 2    Number of Visits 21    Date for PT Re-Evaluation 12/24/21    Authorization Type [place]    PT Start Time 1150    PT Stop Time 1220    PT Time Calculation (min) 30 min    Activity Tolerance Patient tolerated treatment well    Behavior During Therapy [facility] for tasks assessed/performed              Past Medical History:  Diagnosis Date   Allergy    Zyrtec.   Anxiety    followed by Dr. Toy Cookey   Back pain    Complication of anesthesia    Depression    Fibroid    Gastroparesis 09/14/2012   gastric emptying study in 2014   GERD (gastroesophageal reflux disease)    Headache    Pneumonia    2013ish   PONV (postoperative nausea and vomiting)     likes scopolamine patch and zofran /phenergan helps   Pre-diabetes    PTSD (post-traumatic stress disorder)    PTSD (post-traumatic stress disorder)    Sleep apnea 07/01/2018   cpap optional, pt close to not use cpap   Vitamin D deficiency    Wears glasses    Wears glasses    Past Surgical History:  Procedure Laterality Date   ABDOMINAL HYSTERECTOMY  01/2021   ANKLE ARTHROSCOPY Left 2011   CESAREAN SECTION  06/2006   x 1   CHOLECYSTECTOMY  07/2006   laparoscopic   CYSTOSCOPY N/A 02/04/2021   Procedure: CYSTOSCOPY;  Surgeon: Salvadore Dom, MD;  Location: [facility];  Service: Gynecology;  Laterality: N/A;   DILATION AND CURETTAGE OF UTERUS  1997   x 2   FOREIGN BODY REMOVAL Left 11/18/2016   Procedure: REMOVAL FOREIGN BODY EXTREMITY LEFT FOOT;  Surgeon: Trula Slade, DPM;  Location: [facility];  Service: Podiatry;  Laterality: Left;   KNEE ARTHROSCOPY WITH MEDIAL MENISECTOMY Right 09/10/2021   Procedure: KNEE ARTHROSCOPY WITH MEDIAL MENISCAL ROOT REPAIR;  Surgeon: Hiram Gash, MD;  Location: [facility];  Service: Orthopedics;  Laterality: Right;   PILONIDAL CYST EXCISION  1990's   RADIOLOGY WITH ANESTHESIA N/A 11/10/2018   Procedure: MRI WITH ANESTHESIA OF BRAIN AND ORBITS WITH AND WITHOUT CONTRAST;  Surgeon: Radiologist, Medication, MD;  Location: [facility];  Service: Radiology;  Laterality: N/A;   TENDON REPAIR Left 2011   Left Ankle   TOTAL LAPAROSCOPIC HYSTERECTOMY WITH SALPINGECTOMY Bilateral 02/04/2021   Procedure: TOTAL LAPAROSCOPIC HYSTERECTOMY WITH SALPINGECTOMY;  Surgeon: Salvadore Dom, MD;  Location: [facility];  Service: Gynecology;  Laterality: Bilateral;   WISDOM TOOTH EXTRACTION  1990's   Patient Active Problem List   Diagnosis Date Noted   Recurrent major depressive disorder, in partial remission ([place]) 09/30/2021   Status post laparoscopic hysterectomy 02/04/2021   S/P laparoscopic hysterectomy 02/04/2021   PTSD (post-traumatic stress disorder) 07/21/2018   GAD (generalized anxiety disorder) 07/21/2018   Insomnia 07/21/2018   MDD (major depressive disorder) 07/21/2018   Nightmares 07/21/2018   Insulin resistance 10/21/2017   Hypertriglyceridemia 10/07/2017   Vitamin D deficiency 10/07/2017   Residual foreign body in soft tissue 11/12/2016   Pre-diabetes 11/02/2016   Morbid obesity with body mass index (BMI) of 50.0 to 59.9 in adult (  [place]) 10/15/2016    PCP: [name], NP  REFERRING PROVIDER: Hiram Gash, MD  REFERRING DIAG: Right knee arthroscopy with medial meniscal root repair on 12/28  THERAPY DIAG:  Pain in joint of right knee  Stiffness of right knee, not elsewhere classified  Muscle weakness (generalized)  Difficulty walking  ONSET DATE: 09/10/2021  SUBJECTIVE:   SUBJECTIVE STATEMENT: Pt states she has had more soreness into the medial knee with being back at work. Otherwise, no pain with HEP.   PERTINENT HISTORY: No previous injury to R knee  PAIN:  Are you having pain? 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 High-grade radial tear of the medial meniscal posterior horn adjacent to the root attachment site. 2. Small focal area of chondral fissuring along the medial trochlea inferiorly. 3. Small joint effusion  PATIENT SURVEYS:  FOTO 31 62 at D/C 19 pts MCII  TODAY'S TREATMENT:  Patellar mobilizations all directions grade III- demo to pt how to perform at home  Exercises Supine Quad Set - 2 x daily - 7 x weekly - 3 sets - 10 reps - 5 hold Active Straight Leg Raise with Quad Set 2 sets - 10 reps Sitting Heel Slide with Towel - 21 sets - 10 reps - 5 hold SLR ABD 3x10 SLR Ext prone 3x10 Calf stretch 30s 3x   PATIENT EDUCATION:  Education details: protocol, precautions,  anatomy, exercise progression, DOMS expectations, muscle firing,  envelope of function, HEP, POC  Person educated: Patient Education method: Explanation, Demonstration, Tactile cues, Verbal cues, and Handouts Education comprehension: verbalized understanding, returned demonstration, verbal cues required, and tactile cues required   HOME EXERCISE PROGRAM:  Access Code: [email or web address] URL: [email or web address] Date: 10/07/2021 Prepared by: Daleen Bo  Exercises Supine Quad Set - 2 x daily - 7 x weekly - 3 sets - 10 reps - 5 hold Active Straight Leg Raise with  Quad Set - 2 x daily - 7 x weekly - 2 sets - 10 reps Prone Hip Extension - 2 x daily - 7 x weekly - 2 sets - 10 reps Modified Sidelying Hip Abduction - 2 x daily - 7 x weekly - 2 sets - 10 reps Seated Calf Stretch with Strap - 2 x daily - 7 x weekly - 1 sets - 3 reps - 30 hold    ASSESSMENT:  CLINICAL IMPRESSION:  Pt with good tolerance to addition of new HEP. Pt is at protocol limits already for knee flexion and able to reach full TKE without pain. Pt does have medial knee discomfort with sustained quad contraction that appears consistent with post-surgical pain, after review of pt provided op note. Pt to continue at 1x/week frequency until 2nd phase of rehab protocol. Pt advised about acceptable levels of pain with HEP and to hold on particular exercises if self modifications do not reduce irritation. Incisions are closed  and dry with no signs of erythema or excessive edema.   Objective impairments include Abnormal gait, decreased activity tolerance, decreased balance, decreased endurance, decreased mobility, difficulty walking, decreased ROM, decreased strength, hypomobility, increased edema, increased fascial restrictions, increased muscle spasms, impaired flexibility, improper body mechanics, postural dysfunction, obesity, and pain. These impairments are limiting patient from cleaning, community activity, driving, meal prep, occupation, laundry, yard work, shopping, and exercise . Personal factors including Fitness, Profession, Time since onset of injury/illness/exacerbation, and 1-2 comorbidities:    are also affecting patient's functional outcome. Patient will benefit from skilled PT to address above impairments and improve overall function.  REHAB POTENTIAL: Good  CLINICAL DECISION MAKING: Stable/uncomplicated  EVALUATION COMPLEXITY: Low   GOALS:  SHORT TERM GOALS:  STG Name Target Date Goal status  1 Pt will become independent with HEP in order to demonstrate synthesis of PT  education.  Baseline:  11/18/2021 INITIAL  2 Pt will score at least 19 pt increase on FOTO to demonstrate functional improvement in MCII and pt perceived function.   Baseline:  11/18/2021 INITIAL  3 Pt will be able to demonstrate ability to walk with FWB in order to demonstrate functional improvement in LE function for self-care and house hold duties.  Baseline: 11/18/2021 INITIAL   LONG TERM GOALS:   LTG Name Target Date Goal status  1 Pt  will become independent with final HEP in order to demonstrate synthesis of PT education.   12/30/2021 INITIAL  2 Pt will score >/= 62 on FOTO to demonstrate improvement in perceived R LE function.  12/30/2021 INITIAL  3 Pt will be able to perform 5XSTS in under 12s  in order to demonstrate functional improvement above the cut off score for adults.    12/30/2021 INITIAL  4 Pt will be able to demonstrate/report ability to walk >15 mins without pain in order to demonstrate functional improvement and tolerance to exercise and community mobility.   12/30/2021 INITIAL   PLAN: PT FREQUENCY: 1-2x/week  PT DURATION: 12 weeks  PLANNED INTERVENTIONS: Therapeutic exercises, Therapeutic activity, Neuro Muscular re-education, Balance training, Gait training, Patient/Family education, Joint mobilization, Stair training, Orthotic/Fit training, DME instructions, Aquatic Therapy, Dry Needling, Electrical stimulation, Spinal mobilization, Cryotherapy, Moist heat, Taping, Vasopneumatic device, Traction, Ultrasound, Ionotophoresis 4mg /ml Dexamethasone, and Manual therapy  PLAN FOR NEXT SESSION: review HEP, patellar mobs, SLR review, clamshell, SAQ/LAQ no weight  Daleen Bo PT, DPT 10/07/21 12:48 PM

## 2021-10-15 ENCOUNTER — Other Ambulatory Visit: Payer: Self-pay

## 2021-10-15 ENCOUNTER — Ambulatory Visit (HOSPITAL_BASED_OUTPATIENT_CLINIC_OR_DEPARTMENT_OTHER): Payer: 59 | Attending: Orthopaedic Surgery | Admitting: Physical Therapy

## 2021-10-15 ENCOUNTER — Other Ambulatory Visit (HOSPITAL_COMMUNITY): Payer: Self-pay

## 2021-10-15 DIAGNOSIS — M25561 Pain in right knee: Secondary | ICD-10-CM | POA: Diagnosis not present

## 2021-10-15 DIAGNOSIS — F4310 Post-traumatic stress disorder, unspecified: Secondary | ICD-10-CM | POA: Diagnosis not present

## 2021-10-15 DIAGNOSIS — R262 Difficulty in walking, not elsewhere classified: Secondary | ICD-10-CM | POA: Diagnosis not present

## 2021-10-15 DIAGNOSIS — M6281 Muscle weakness (generalized): Secondary | ICD-10-CM | POA: Diagnosis not present

## 2021-10-15 DIAGNOSIS — M25661 Stiffness of right knee, not elsewhere classified: Secondary | ICD-10-CM | POA: Diagnosis not present

## 2021-10-15 NOTE — Therapy (Signed)
OUTPATIENT PHYSICAL THERAPY LOWER EXTREMITY TREATMENT   Patient Name: Caroline Gonzales MRN: [id number] DOB:05-27-1976, 46 y.o., female Today's Date: 10/15/2021      Past Medical History:  Diagnosis Date   Allergy    Zyrtec.   Anxiety    followed by Dr. Toy Cookey   Back pain    Complication of anesthesia    Depression    Fibroid    Gastroparesis 09/14/2012   gastric emptying study in 2014   GERD (gastroesophageal reflux disease)    Headache    Pneumonia    2013ish   PONV (postoperative nausea and vomiting)     likes scopolamine patch and zofran /phenergan helps   Pre-diabetes    PTSD (post-traumatic stress disorder)    PTSD (post-traumatic stress disorder)    Sleep apnea 07/01/2018   cpap optional, pt close to not use cpap   Vitamin D deficiency    Wears glasses    Wears glasses    Past Surgical History:  Procedure Laterality Date   ABDOMINAL HYSTERECTOMY  01/2021   ANKLE ARTHROSCOPY Left 2011   CESAREAN SECTION  06/2006   x 1   CHOLECYSTECTOMY  07/2006   laparoscopic   CYSTOSCOPY N/A 02/04/2021   Procedure: CYSTOSCOPY;  Surgeon: Salvadore Dom, MD;  Location: [place];  Service: Gynecology;  Laterality: N/A;   DILATION AND CURETTAGE OF UTERUS  1997   x 2   FOREIGN BODY REMOVAL Left 11/18/2016   Procedure: REMOVAL FOREIGN BODY EXTREMITY LEFT FOOT;  Surgeon: Trula Slade, DPM;  Location: [place];  Service: Podiatry;  Laterality: Left;   KNEE ARTHROSCOPY WITH MEDIAL MENISECTOMY Right 09/10/2021   Procedure: KNEE ARTHROSCOPY WITH MEDIAL MENISCAL ROOT REPAIR;  Surgeon: Hiram Gash, MD;  Location: [place];  Service: Orthopedics;  Laterality: Right;   PILONIDAL CYST EXCISION  1990's   RADIOLOGY WITH ANESTHESIA N/A 11/10/2018   Procedure: MRI WITH ANESTHESIA OF BRAIN AND ORBITS WITH AND WITHOUT CONTRAST;  Surgeon: Radiologist, Medication, MD;  Location: [place];  Service: Radiology;  Laterality: N/A;   TENDON REPAIR Left 2011   Left Ankle    TOTAL LAPAROSCOPIC HYSTERECTOMY WITH SALPINGECTOMY Bilateral 02/04/2021   Procedure: TOTAL LAPAROSCOPIC HYSTERECTOMY WITH SALPINGECTOMY;  Surgeon: Salvadore Dom, MD;  Location: [place];  Service: Gynecology;  Laterality: Bilateral;   WISDOM TOOTH EXTRACTION  1990's   Patient Active Problem List   Diagnosis Date Noted   Recurrent major depressive disorder, in partial remission ([place]) 09/30/2021   Status post laparoscopic hysterectomy 02/04/2021   S/P laparoscopic hysterectomy 02/04/2021   PTSD (post-traumatic stress disorder) 07/21/2018   GAD (generalized anxiety disorder) 07/21/2018   Insomnia 07/21/2018   MDD (major depressive disorder) 07/21/2018   Nightmares 07/21/2018   Insulin resistance 10/21/2017   Hypertriglyceridemia 10/07/2017   Vitamin D deficiency 10/07/2017   Residual foreign body in soft tissue 11/12/2016   Pre-diabetes 11/02/2016   Morbid obesity with body mass index (BMI) of 50.0 to 59.9 in adult ([place]) 10/15/2016    PCP: [name], NP  REFERRING PROVIDER: No ref. provider found  REFERRING DIAG: Right knee arthroscopy with medial meniscal root repair on 12/28  THERAPY DIAG:  No diagnosis found.  ONSET DATE: 09/10/2021  SUBJECTIVE:   SUBJECTIVE STATEMENT: Pt states that after the last visit, she had a spike in her pain. It lasted that afternoon. It is much better now. She has not worked on her exercises since that appointment.    PERTINENT HISTORY: No previous injury  to R knee  PAIN:  Are you having pain? 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 High-grade radial tear of the medial meniscal posterior horn adjacent to the root attachment site. 2. Small focal area of chondral fissuring along the medial trochlea inferiorly. 3. Small joint effusion  PATIENT SURVEYS:  FOTO 31 62 at D/C 19 pts MCII  TODAY'S TREATMENT: 2/2  Quad set 3x10  SLR 3x10  Heel slide with towel 3x10   PROM into flexion and extension; patellar mobilizations   Seated clamshell: Patient felt that the side lying hip abduction may have caused her some pain the last time  Standing:  Weight shift forward and back x20  Side to side with care not to go over mid line of the knee x20  Both in walker     Eval   Patellar mobilizations all directions grade III- demo to pt how to perform at home  Exercises Supine Quad Set - 2 x daily - 7 x weekly - 3 sets - 10 reps - 5 hold Active Straight Leg Raise with Quad Set 2 sets - 10 reps Sitting Heel Slide with Towel - 21 sets - 10 reps - 5 hold SLR ABD 3x10 SLR Ext prone 3x10 Calf stretch 30s 3x   PATIENT EDUCATION:  Education details: protocol, precautions,  anatomy, exercise progression, DOMS expectations, muscle firing,  envelope of function, HEP, POC  Person educated: Patient Education method: Explanation, Demonstration, Tactile cues, Verbal cues, and Handouts Education comprehension: verbalized understanding, returned demonstration, verbal cues required, and tactile cues required   HOME EXERCISE PROGRAM:  Access Code: [email or web address] URL: [email or web address] Date: 10/07/2021 Prepared by: Daleen Bo  Exercises Supine Quad Set - 2 x daily - 7 x weekly - 3 sets - 10 reps - 5 hold Active Straight Leg Raise with Quad Set -  2 x daily - 7 x weekly - 2 sets - 10 reps Prone Hip Extension - 2 x daily - 7 x weekly - 2 sets - 10 reps Modified Sidelying Hip Abduction - 2 x daily - 7 x weekly - 2 sets - 10 reps Seated Calf Stretch with Strap - 2 x daily - 7 x weekly - 1 sets - 3 reps - 30 hold    ASSESSMENT:  CLINICAL IMPRESSION: Patient is making progress. She had ROM to the protocol limit with manual therapy Her knee flexion was measured at 0-96 without end range pain. She had no pain with exercises this visit, although she had no pain last visit either until after. She did report some discomfort with SL hip abduction. She was given hip abduction with the band in sitting instead. We also reviewed weight shifting in her walker. She tolerated well. We will continue to advance as tolerated.  Objective impairments include Abnormal gait, decreased activity tolerance, decreased balance, decreased endurance, decreased mobility, difficulty walking, decreased ROM, decreased  strength, hypomobility, increased edema, increased fascial restrictions, increased muscle spasms, impaired flexibility, improper body mechanics, postural dysfunction, obesity, and pain. These impairments are limiting patient from cleaning, community activity, driving, meal prep, occupation, laundry, yard work, shopping, and exercise . Personal factors including Fitness, Profession, Time since onset of injury/illness/exacerbation, and 1-2 comorbidities:    are also affecting patient's functional outcome. Patient will benefit from skilled PT to address above impairments and improve overall function.  REHAB POTENTIAL: Good  CLINICAL DECISION MAKING: Stable/uncomplicated  EVALUATION COMPLEXITY: Low   GOALS:  SHORT TERM GOALS:  STG Name Target Date Goal status  1 Pt will become independent with HEP in order to demonstrate synthesis of PT education.  Baseline:  11/26/2021 INITIAL  2 Pt will score at least 19 pt increase on FOTO to demonstrate functional  improvement in MCII and pt perceived function.   Baseline:  11/26/2021 INITIAL  3 Pt will be able to demonstrate ability to walk with FWB in order to demonstrate functional improvement in LE function for self-care and house hold duties.  Baseline: 11/26/2021 INITIAL   LONG TERM GOALS:   LTG Name Target Date Goal status  1 Pt  will become independent with final HEP in order to demonstrate synthesis of PT education.   01/07/2022 INITIAL  2 Pt will score >/= 62 on FOTO to demonstrate improvement in perceived R LE function.  01/07/2022 INITIAL  3 Pt will be able to perform 5XSTS in under 12s  in order to demonstrate functional improvement above the cut off score for adults.    01/07/2022 INITIAL  4 Pt will be able to demonstrate/report ability to walk >15 mins without pain in order to demonstrate functional improvement and tolerance to exercise and community mobility.   01/07/2022 INITIAL   PLAN: PT FREQUENCY: 1-2x/week  PT DURATION: 12 weeks  PLANNED INTERVENTIONS: Therapeutic exercises, Therapeutic activity, Neuro Muscular re-education, Balance training, Gait training, Patient/Family education, Joint mobilization, Stair training, Orthotic/Fit training, DME instructions, Aquatic Therapy, Dry Needling, Electrical stimulation, Spinal mobilization, Cryotherapy, Moist heat, Taping, Vasopneumatic device, Traction, Ultrasound, Ionotophoresis 4mg /ml Dexamethasone, and Manual therapy  PLAN FOR NEXT SESSION: review HEP, patellar mobs, SLR review, clamshell, SAQ/LAQ no weight  Burnis Medin PT DPT  10/15/21 4:28 PM

## 2021-10-16 ENCOUNTER — Encounter (HOSPITAL_BASED_OUTPATIENT_CLINIC_OR_DEPARTMENT_OTHER): Payer: Self-pay | Admitting: Physical Therapy

## 2021-10-17 ENCOUNTER — Encounter (HOSPITAL_BASED_OUTPATIENT_CLINIC_OR_DEPARTMENT_OTHER): Payer: Self-pay | Admitting: Physical Therapy

## 2021-10-20 ENCOUNTER — Encounter (HOSPITAL_BASED_OUTPATIENT_CLINIC_OR_DEPARTMENT_OTHER): Payer: Self-pay | Admitting: Physical Therapy

## 2021-10-20 ENCOUNTER — Ambulatory Visit (HOSPITAL_BASED_OUTPATIENT_CLINIC_OR_DEPARTMENT_OTHER): Payer: 59 | Attending: Orthopaedic Surgery | Admitting: Physical Therapy

## 2021-10-20 ENCOUNTER — Other Ambulatory Visit: Payer: Self-pay

## 2021-10-20 DIAGNOSIS — M6281 Muscle weakness (generalized): Secondary | ICD-10-CM | POA: Diagnosis not present

## 2021-10-20 DIAGNOSIS — M25661 Stiffness of right knee, not elsewhere classified: Secondary | ICD-10-CM | POA: Diagnosis not present

## 2021-10-20 DIAGNOSIS — M25561 Pain in right knee: Secondary | ICD-10-CM | POA: Diagnosis not present

## 2021-10-20 DIAGNOSIS — R262 Difficulty in walking, not elsewhere classified: Secondary | ICD-10-CM | POA: Diagnosis not present

## 2021-10-20 NOTE — Therapy (Signed)
OUTPATIENT PHYSICAL THERAPY LOWER EXTREMITY TREATMENT   Patient Name: Caroline Gonzales MRN: [id number] DOB:11-12-1975, 46 y.o., female Today's Date: 10/20/2021   PT End of Session - 10/20/21 1521     Visit Number 4    Number of Visits 21    Date for PT Re-Evaluation 12/24/21    Authorization Type [employer]    PT Start Time 1519    PT Stop Time 1600    PT Time Calculation (min) 41 min    Activity Tolerance Patient tolerated treatment well    Behavior During Therapy [facility] for tasks assessed/performed               Past Medical History:  Diagnosis Date   Allergy    Zyrtec.   Anxiety    followed by Dr. Toy Cookey   Back pain    Complication of anesthesia    Depression    Fibroid    Gastroparesis 09/14/2012   gastric emptying study in 2014   GERD (gastroesophageal reflux disease)    Headache    Pneumonia    2013ish   PONV (postoperative nausea and vomiting)     likes scopolamine patch and zofran /phenergan helps   Pre-diabetes    PTSD (post-traumatic stress disorder)    PTSD (post-traumatic stress disorder)    Sleep apnea 07/01/2018   cpap optional, pt close to not use cpap   Vitamin D deficiency    Wears glasses    Wears glasses    Past Surgical History:  Procedure Laterality Date   ABDOMINAL HYSTERECTOMY  01/2021   ANKLE ARTHROSCOPY Left 2011   CESAREAN SECTION  06/2006   x 1   CHOLECYSTECTOMY  07/2006   laparoscopic   CYSTOSCOPY N/A 02/04/2021   Procedure: CYSTOSCOPY;  Surgeon: Salvadore Dom, MD;  Location: [place];  Service: Gynecology;  Laterality: N/A;   DILATION AND CURETTAGE OF UTERUS  1997   x 2   FOREIGN BODY REMOVAL Left 11/18/2016   Procedure: REMOVAL FOREIGN BODY EXTREMITY LEFT FOOT;  Surgeon: Trula Slade, DPM;  Location: [place];  Service: Podiatry;  Laterality: Left;   KNEE ARTHROSCOPY WITH MEDIAL MENISECTOMY Right 09/10/2021   Procedure: KNEE ARTHROSCOPY WITH MEDIAL MENISCAL ROOT REPAIR;  Surgeon: Hiram Gash, MD;  Location: [place];  Service: Orthopedics;  Laterality: Right;   PILONIDAL CYST EXCISION  1990's   RADIOLOGY WITH ANESTHESIA N/A 11/10/2018   Procedure: MRI WITH ANESTHESIA OF BRAIN AND ORBITS WITH AND WITHOUT CONTRAST;  Surgeon: Radiologist, Medication, MD;  Location: [place];  Service: Radiology;  Laterality: N/A;   TENDON REPAIR Left 2011   Left Ankle   TOTAL LAPAROSCOPIC HYSTERECTOMY WITH SALPINGECTOMY Bilateral 02/04/2021   Procedure: TOTAL LAPAROSCOPIC HYSTERECTOMY WITH SALPINGECTOMY;  Surgeon: Salvadore Dom, MD;  Location: [place];  Service: Gynecology;  Laterality: Bilateral;   WISDOM TOOTH EXTRACTION  1990's   Patient Active Problem List   Diagnosis Date Noted   Recurrent major depressive disorder, in partial remission ([place]) 09/30/2021   Status post laparoscopic hysterectomy 02/04/2021   S/P laparoscopic hysterectomy 02/04/2021   PTSD (post-traumatic stress disorder) 07/21/2018   GAD (generalized anxiety disorder) 07/21/2018   Insomnia 07/21/2018   MDD (major depressive disorder) 07/21/2018   Nightmares 07/21/2018   Insulin resistance 10/21/2017   Hypertriglyceridemia 10/07/2017   Vitamin D deficiency 10/07/2017   Residual foreign body in soft tissue 11/12/2016   Pre-diabetes 11/02/2016   Morbid obesity with body mass index (BMI) of 50.0 to 59.9 in  adult ([place]) 10/15/2016    PCP: [name], NP  REFERRING PROVIDER: Hiram Gash, MD  REFERRING DIAG: Right knee arthroscopy with medial meniscal root repair on 12/28  THERAPY DIAG:  Pain in joint of right knee  Stiffness of right knee, not elsewhere classified  Muscle weakness (generalized)  Difficulty walking  ONSET DATE: 09/10/2021  SUBJECTIVE:   SUBJECTIVE STATEMENT: Patient reports her knee has felt great. She has been working on her weight shifting in standing. Sh ehas been icing it when needed.   PERTINENT HISTORY: No previous injury to R knee  PAIN:  Are you having pain? 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 High-grade radial tear of the medial meniscal posterior horn adjacent to the root attachment site. 2. Small focal area of chondral fissuring along the medial trochlea inferiorly. 3. Small joint effusion  PATIENT SURVEYS:  FOTO 31 62 at D/C 19 pts MCII Objective   0-104 TODAY'S TREATMENT:  2/6 Quad set 3x10  SAQ 3x10  SLR 3x10  Heel slide with towel 3x10   PROM into flexion and extension; patellar mobilizations   Seated clamshell: Patient felt that the side lying hip abduction may have caused her some pain the last time  Ankle eversion/ inversion and PF green band x20   Standing:  Weight shift forward and back x20  Side to side with care not to go over mid line of the knee x20  Both in walker    2/2  Quad set 3x10  SLR 3x10  Heel slide with towel 3x10   PROM into flexion and extension; patellar mobilizations   Seated clamshell: Patient felt that the side lying hip abduction may have caused her some pain the last time  Standing:  Weight shift forward and back x20  Side to side with care not to go over mid line of the knee x20  Both in walker     Eval   Patellar mobilizations all directions grade III- demo to pt how to perform at home  Exercises Supine Quad Set - 2 x daily - 7 x  weekly - 3 sets - 10 reps - 5 hold Active Straight Leg Raise with Quad Set 2 sets - 10 reps Sitting Heel Slide with Towel - 21 sets - 10 reps - 5 hold SLR ABD 3x10 SLR Ext prone 3x10 Calf stretch 30s 3x   PATIENT EDUCATION:  Education details: protocol, precautions,  anatomy, exercise progression, DOMS expectations, muscle firing,  envelope of function, HEP, POC  Person educated: Patient Education method: Explanation, Demonstration, Tactile cues, Verbal cues, and Handouts Education comprehension: verbalized understanding, returned demonstration, verbal cues required, and tactile cues required   HOME EXERCISE PROGRAM:  Access Code: [email or web address] URL: [email or web address] Date: 10/07/2021 Prepared by: Daleen Bo  Exercises Supine Quad Set - 2 x daily - 7 x weekly - 3 sets - 10 reps -  5 hold Active Straight Leg Raise with Quad Set - 2 x daily - 7 x weekly - 2 sets - 10 reps Prone Hip Extension - 2 x daily - 7 x weekly - 2 sets - 10 reps Modified Sidelying Hip Abduction - 2 x daily - 7 x weekly - 2 sets - 10 reps Seated Calf Stretch with Strap - 2 x daily - 7 x weekly - 1 sets - 3 reps - 30 hold    ASSESSMENT:  CLINICAL IMPRESSION: Patient is making good progress. Her range was measured at 0-104. Therapy reviewed ambulation with increased weight bearing with the walker. We also reviewed proper fit with the cane and proper technique. She had no sign of buckling and no increase in pain with gait training. She was advised that she can start practicing at home as long as she dosen't get too sore. We added in SAQ to her exercises program. She also reports some baseiline inversion ankle rolling. She was given eversion strengthening at home   Objective impairments include Abnormal gait, decreased activity tolerance, decreased balance, decreased endurance, decreased mobility, difficulty walking, decreased ROM, decreased strength, hypomobility, increased edema, increased fascial  restrictions, increased muscle spasms, impaired flexibility, improper body mechanics, postural dysfunction, obesity, and pain. These impairments are limiting patient from cleaning, community activity, driving, meal prep, occupation, laundry, yard work, shopping, and exercise . Personal factors including Fitness, Profession, Time since onset of injury/illness/exacerbation, and 1-2 comorbidities:    are also affecting patient's functional outcome. Patient will benefit from skilled PT to address above impairments and improve overall function.  REHAB POTENTIAL: Good  CLINICAL DECISION MAKING: Stable/uncomplicated  EVALUATION COMPLEXITY: Low   GOALS:  SHORT TERM GOALS:  STG Name Target Date Goal status 2/6  1 Pt will become independent with HEP in order to demonstrate synthesis of PT education.  Baseline:  12/01/2021 Performing at home  Achieved   2 Pt will score at least 19 pt increase on FOTO to demonstrate functional improvement in MCII and pt perceived function.   Baseline:  12/01/2021 Not tested   3 Pt will be able to demonstrate ability to walk with FWB in order to demonstrate functional improvement in LE function for self-care and house hold duties.  Baseline: 12/01/2021 Progressing with her weight bearing    LONG TERM GOALS:   LTG Name Target Date Goal status  1 Pt  will become independent with final HEP in order to demonstrate synthesis of PT education.   01/12/2022 INITIAL  2 Pt will score >/= 62 on FOTO to demonstrate improvement in perceived R LE function.  01/12/2022 INITIAL  3 Pt will be able to perform 5XSTS in under 12s  in order to demonstrate functional improvement above the cut off score for adults.    01/12/2022 INITIAL  4 Pt will be able to demonstrate/report ability to walk >15 mins without pain in order to demonstrate functional improvement and tolerance to exercise and community mobility.   01/12/2022 INITIAL   PLAN: PT FREQUENCY: 1-2x/week  PT DURATION: 12  weeks  PLANNED INTERVENTIONS: Therapeutic exercises, Therapeutic activity, Neuro Muscular re-education, Balance training, Gait training, Patient/Family education, Joint mobilization, Stair training, Orthotic/Fit training, DME instructions, Aquatic Therapy, Dry Needling, Electrical stimulation, Spinal mobilization, Cryotherapy, Moist heat, Taping, Vasopneumatic device, Traction, Ultrasound, Ionotophoresis 4mg /ml Dexamethasone, and Manual therapy  PLAN FOR NEXT SESSION: review HEP, patellar mobs, SLR review, clamshell, SAQ/LAQ no weight  Burnis Medin PT DPT  10/20/21 3:23 PM

## 2021-10-21 ENCOUNTER — Encounter (HOSPITAL_BASED_OUTPATIENT_CLINIC_OR_DEPARTMENT_OTHER): Payer: Self-pay | Admitting: Physical Therapy

## 2021-10-22 ENCOUNTER — Encounter (HOSPITAL_BASED_OUTPATIENT_CLINIC_OR_DEPARTMENT_OTHER): Payer: Self-pay | Admitting: Physical Therapy

## 2021-10-22 DIAGNOSIS — F4310 Post-traumatic stress disorder, unspecified: Secondary | ICD-10-CM | POA: Diagnosis not present

## 2021-10-27 ENCOUNTER — Other Ambulatory Visit (HOSPITAL_COMMUNITY): Payer: Self-pay

## 2021-10-28 ENCOUNTER — Encounter (HOSPITAL_BASED_OUTPATIENT_CLINIC_OR_DEPARTMENT_OTHER): Payer: Self-pay | Admitting: Physical Therapy

## 2021-10-29 ENCOUNTER — Other Ambulatory Visit (HOSPITAL_COMMUNITY): Payer: Self-pay

## 2021-10-29 ENCOUNTER — Encounter (HOSPITAL_BASED_OUTPATIENT_CLINIC_OR_DEPARTMENT_OTHER): Payer: Self-pay | Admitting: Nurse Practitioner

## 2021-10-29 DIAGNOSIS — F4310 Post-traumatic stress disorder, unspecified: Secondary | ICD-10-CM | POA: Diagnosis not present

## 2021-10-29 MED ORDER — NYSTATIN 100000 UNIT/ML MT SUSP
5.0000 mL | Freq: Three times a day (TID) | OROMUCOSAL | 0 refills | Status: DC
  Filled 2021-10-29: qty 180, 12d supply, fill #0

## 2021-10-30 ENCOUNTER — Other Ambulatory Visit: Payer: Self-pay

## 2021-10-30 ENCOUNTER — Encounter (HOSPITAL_BASED_OUTPATIENT_CLINIC_OR_DEPARTMENT_OTHER): Payer: Self-pay | Admitting: Physical Therapy

## 2021-10-30 ENCOUNTER — Ambulatory Visit (HOSPITAL_BASED_OUTPATIENT_CLINIC_OR_DEPARTMENT_OTHER): Payer: 59 | Admitting: Physical Therapy

## 2021-10-30 DIAGNOSIS — M25661 Stiffness of right knee, not elsewhere classified: Secondary | ICD-10-CM | POA: Diagnosis not present

## 2021-10-30 DIAGNOSIS — M6281 Muscle weakness (generalized): Secondary | ICD-10-CM | POA: Diagnosis not present

## 2021-10-30 DIAGNOSIS — R262 Difficulty in walking, not elsewhere classified: Secondary | ICD-10-CM

## 2021-10-30 DIAGNOSIS — M25561 Pain in right knee: Secondary | ICD-10-CM

## 2021-10-30 NOTE — Therapy (Signed)
OUTPATIENT PHYSICAL THERAPY LOWER EXTREMITY TREATMENT   Patient Name: Caroline Gonzales MRN: [id number] DOB:03/19/1976, 46 y.o., female Today's Date: 10/30/2021   PT End of Session - 10/30/21 1545     Visit Number 5    Number of Visits 21    Date for PT Re-Evaluation 12/24/21    Authorization Type [place]    PT Start Time 1547    PT Stop Time 1625    PT Time Calculation (min) 38 min    Activity Tolerance Patient tolerated treatment well    Behavior During Therapy WFL for tasks assessed/performed                Past Medical History:  Diagnosis Date   Allergy    Zyrtec.   Anxiety    followed by Dr. Toy Cookey   Back pain    Complication of anesthesia    Depression    Fibroid    Gastroparesis 09/14/2012   gastric emptying study in 2014   GERD (gastroesophageal reflux disease)    Headache    Pneumonia    2013ish   PONV (postoperative nausea and vomiting)     likes scopolamine patch and zofran /phenergan helps   Pre-diabetes    PTSD (post-traumatic stress disorder)    PTSD (post-traumatic stress disorder)    Sleep apnea 07/01/2018   cpap optional, pt close to not use cpap   Vitamin D deficiency    Wears glasses    Wears glasses    Past Surgical History:  Procedure Laterality Date   ABDOMINAL HYSTERECTOMY  01/2021   ANKLE ARTHROSCOPY Left 2011   CESAREAN SECTION  06/2006   x 1   CHOLECYSTECTOMY  07/2006   laparoscopic   CYSTOSCOPY N/A 02/04/2021   Procedure: CYSTOSCOPY;  Surgeon: Salvadore Dom, MD;  Location: [place];  Service: Gynecology;  Laterality: N/A;   DILATION AND CURETTAGE OF UTERUS  1997   x 2   FOREIGN BODY REMOVAL Left 11/18/2016   Procedure: REMOVAL FOREIGN BODY EXTREMITY LEFT FOOT;  Surgeon: Trula Slade, DPM;  Location: [place];  Service: Podiatry;  Laterality: Left;   KNEE ARTHROSCOPY WITH MEDIAL MENISECTOMY Right 09/10/2021   Procedure: KNEE ARTHROSCOPY WITH MEDIAL MENISCAL ROOT REPAIR;  Surgeon: Hiram Gash, MD;  Location:  [place];  Service: Orthopedics;  Laterality: Right;   PILONIDAL CYST EXCISION  1990's   RADIOLOGY WITH ANESTHESIA N/A 11/10/2018   Procedure: MRI WITH ANESTHESIA OF BRAIN AND ORBITS WITH AND WITHOUT CONTRAST;  Surgeon: Radiologist, Medication, MD;  Location: [place];  Service: Radiology;  Laterality: N/A;   TENDON REPAIR Left 2011   Left Ankle   TOTAL LAPAROSCOPIC HYSTERECTOMY WITH SALPINGECTOMY Bilateral 02/04/2021   Procedure: TOTAL LAPAROSCOPIC HYSTERECTOMY WITH SALPINGECTOMY;  Surgeon: Salvadore Dom, MD;  Location: [place];  Service: Gynecology;  Laterality: Bilateral;   WISDOM TOOTH EXTRACTION  1990's   Patient Active Problem List   Diagnosis Date Noted   Recurrent major depressive disorder, in partial remission ([place]) 09/30/2021   Status post laparoscopic hysterectomy 02/04/2021   S/P laparoscopic hysterectomy 02/04/2021   PTSD (post-traumatic stress disorder) 07/21/2018   GAD (generalized anxiety disorder) 07/21/2018   Insomnia 07/21/2018   MDD (major depressive disorder) 07/21/2018   Nightmares 07/21/2018   Insulin resistance 10/21/2017   Hypertriglyceridemia 10/07/2017   Vitamin D deficiency 10/07/2017   Residual foreign body in soft tissue 11/12/2016   Pre-diabetes 11/02/2016   Morbid obesity with body mass index (BMI) of 50.0 to 59.9  in adult [facility]) 10/15/2016    PCP: [name], NP  REFERRING PROVIDER: Hiram Gash, MD  REFERRING DIAG: Right knee arthroscopy with medial meniscal root repair on 12/28  THERAPY DIAG:  Pain in joint of right knee  Stiffness of right knee, not elsewhere classified  Muscle weakness (generalized)  Difficulty walking  ONSET DATE: 09/10/2021  SUBJECTIVE:   SUBJECTIVE STATEMENT: Pt states her knee is feeling much better. She is walking more often and is to bear weight and walk some without the cane.   PERTINENT HISTORY: No previous injury to R knee  PAIN:  Are you having pain? 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 High-grade radial tear of the medial meniscal posterior horn adjacent to the root attachment site. 2. Small focal area of chondral fissuring along the medial trochlea inferiorly. 3. Small joint effusion  PATIENT SURVEYS:  FOTO 31 62 at D/C 19 pts MCII Objective   2 hyper ext 116 flexion Post mob 120   TODAY'S TREATMENT:  2/16  R knee flexion joint mob grade III-IV 4 rounds of 30s each  LAQ RTB 3x10 STS 2x10 from table height Stair step flexion stretch 10s 10x TKE RTB 3x10 Standing HR 20x Heel toe rocking 2x10  HEP frequency and modifications  2/6 Quad set 3x10  SAQ 3x10  SLR 3x10  Heel slide with towel 3x10   PROM into flexion and extension; patellar mobilizations   Seated clamshell: Patient felt that the side lying hip abduction may have caused her some pain the last time  Ankle eversion/ inversion and PF green band x20   Standing:  Weight shift forward and back x20  Side to side with care not to go over mid line of the knee x20  Both in walker    2/2  Quad set 3x10  SLR 3x10  Heel slide with towel 3x10   PROM into flexion and extension; patellar mobilizations   Seated clamshell: Patient felt that the side lying hip abduction may have caused her some pain the last time  Standing:   Weight shift forward and back x20  Side to side with care not to go over mid line of the knee x20  Both in walker    PATIENT EDUCATION:  Education details: protocol, AD usage/ brace usage, anatomy, exercise progression, DOMS expectations, muscle firing,  envelope of function, HEP, POC  Person educated: Patient Education method: Explanation, Demonstration, Tactile cues, Verbal cues, and Handouts Education comprehension: verbalized understanding, returned demonstration, verbal cues required, and tactile cues required   HOME EXERCISE PROGRAM:  Access Code: [email or web address] URL: [email or web address] Date: 10/07/2021 Prepared by: Daleen Bo  Exercises Supine Quad Set - 2 x daily - 7 x weekly - 3 sets - 10 reps - 5 hold Active Straight Leg Raise with Quad Set - 2 x daily - 7 x weekly - 2 sets -  10 reps Prone Hip Extension - 2 x daily - 7 x weekly - 2 sets - 10 reps Modified Sidelying Hip Abduction - 2 x daily - 7 x weekly - 2 sets - 10 reps Seated Calf Stretch with Strap - 2 x daily - 7 x weekly - 1 sets - 3 reps - 30 hold    ASSESSMENT:  CLINICAL IMPRESSION: Pt able to progress to next phase of rehab at this time. Pt able to begin like CKC exercise and progressive WB on the R LE. Pt is 7 weeks at this time and demonstrates good quadriceps control at this time with level surface gait without AD and brace. Pt advised to keep AD for uneven surface or long distance activities- home and indoor environments can be without SPC. Pt HEP updated at this time. Plan to assess stairs and progress SL stability at next session. Pt with quad endurance deficits and fatigue with sustained repetition and stance time.    Objective impairments include Abnormal gait, decreased activity tolerance, decreased balance, decreased endurance, decreased mobility, difficulty walking, decreased ROM, decreased strength, hypomobility, increased edema, increased fascial restrictions, increased muscle spasms,  impaired flexibility, improper body mechanics, postural dysfunction, obesity, and pain. These impairments are limiting patient from cleaning, community activity, driving, meal prep, occupation, laundry, yard work, shopping, and exercise . Personal factors including Fitness, Profession, Time since onset of injury/illness/exacerbation, and 1-2 comorbidities:    are also affecting patient's functional outcome. Patient will benefit from skilled PT to address above impairments and improve overall function.  REHAB POTENTIAL: Good  CLINICAL DECISION MAKING: Stable/uncomplicated  EVALUATION COMPLEXITY: Low   GOALS:  SHORT TERM GOALS:  STG Name Target Date Goal status 2/6  1 Pt will become independent with HEP in order to demonstrate synthesis of PT education.  Baseline:  12/11/2021 Performing at home  Achieved   2 Pt will score at least 19 pt increase on FOTO to demonstrate functional improvement in MCII and pt perceived function.   Baseline:  12/11/2021 Not tested   3 Pt will be able to demonstrate ability to walk with FWB in order to demonstrate functional improvement in LE function for self-care and house hold duties.  Baseline: 12/11/2021 Progressing with her weight bearing    LONG TERM GOALS:   LTG Name Target Date Goal status  1 Pt  will become independent with final HEP in order to demonstrate synthesis of PT education.   01/22/2022 INITIAL  2 Pt will score >/= 62 on FOTO to demonstrate improvement in perceived R LE function.  01/22/2022 INITIAL  3 Pt will be able to perform 5XSTS in under 12s  in order to demonstrate functional improvement above the cut off score for adults.    01/22/2022 INITIAL  4 Pt will be able to demonstrate/report ability to walk >15 mins without pain in order to demonstrate functional improvement and tolerance to exercise and community mobility.   01/22/2022 INITIAL   PLAN: PT FREQUENCY: 1-2x/week  PT DURATION: 12 weeks  PLANNED INTERVENTIONS: Therapeutic  exercises, Therapeutic activity, Neuro Muscular re-education, Balance training, Gait training, Patient/Family education, Joint mobilization, Stair training, Orthotic/Fit training, DME instructions, Aquatic Therapy, Dry Needling, Electrical stimulation, Spinal mobilization, Cryotherapy, Moist heat, Taping, Vasopneumatic device, Traction, Ultrasound, Ionotophoresis 4mg /ml Dexamethasone, and Manual therapy  PLAN FOR NEXT SESSION: review HEP, small step up, tandem balance, LAQ with weight, sidestepping  Daleen Bo PT, DPT 10/30/21 4:35 PM

## 2021-10-31 ENCOUNTER — Other Ambulatory Visit (HOSPITAL_COMMUNITY): Payer: Self-pay

## 2021-11-03 ENCOUNTER — Other Ambulatory Visit (HOSPITAL_COMMUNITY): Payer: Self-pay

## 2021-11-04 ENCOUNTER — Encounter (HOSPITAL_BASED_OUTPATIENT_CLINIC_OR_DEPARTMENT_OTHER): Payer: Self-pay | Admitting: Physical Therapy

## 2021-11-04 ENCOUNTER — Ambulatory Visit (HOSPITAL_BASED_OUTPATIENT_CLINIC_OR_DEPARTMENT_OTHER): Payer: 59 | Admitting: Physical Therapy

## 2021-11-04 ENCOUNTER — Other Ambulatory Visit: Payer: Self-pay

## 2021-11-04 DIAGNOSIS — M25561 Pain in right knee: Secondary | ICD-10-CM

## 2021-11-04 DIAGNOSIS — M6281 Muscle weakness (generalized): Secondary | ICD-10-CM | POA: Diagnosis not present

## 2021-11-04 DIAGNOSIS — R262 Difficulty in walking, not elsewhere classified: Secondary | ICD-10-CM

## 2021-11-04 DIAGNOSIS — M25661 Stiffness of right knee, not elsewhere classified: Secondary | ICD-10-CM

## 2021-11-04 NOTE — Therapy (Signed)
OUTPATIENT PHYSICAL THERAPY LOWER EXTREMITY TREATMENT   Patient Name: Caroline Gonzales MRN: [id number] DOB:[birth year], 46 y.o., female Today's Date: 11/04/2021   PT End of Session - 11/04/21 1522     Visit Number 6    Number of Visits 21    Date for PT Re-Evaluation 12/24/21    Authorization Type [place]    PT Start Time 1515    PT Stop Time 1554    PT Time Calculation (min) 39 min    Activity Tolerance Patient tolerated treatment well    Behavior During Therapy [facility] for tasks assessed/performed                 Past Medical History:  Diagnosis Date   Allergy    Zyrtec.   Anxiety    followed by Dr. Toy Gonzales   Back pain    Complication of anesthesia    Depression    Fibroid    Gastroparesis 09/14/2012   gastric emptying study in 2014   GERD (gastroesophageal reflux disease)    Headache    Pneumonia    2013ish   PONV (postoperative nausea and vomiting)     likes scopolamine patch and zofran /phenergan helps   Pre-diabetes    PTSD (post-traumatic stress disorder)    PTSD (post-traumatic stress disorder)    Sleep apnea 07/01/2018   cpap optional, pt close to not use cpap   Vitamin D deficiency    Wears glasses    Wears glasses    Past Surgical History:  Procedure Laterality Date   ABDOMINAL HYSTERECTOMY  01/2021   ANKLE ARTHROSCOPY Left 2011   CESAREAN SECTION  06/2006   x 1   CHOLECYSTECTOMY  07/2006   laparoscopic   CYSTOSCOPY N/A 02/04/2021   Procedure: CYSTOSCOPY;  Surgeon: Caroline Dom, MD;  Location: [place];  Service: Gynecology;  Laterality: N/A;   DILATION AND CURETTAGE OF UTERUS  1997   x 2   FOREIGN BODY REMOVAL Left 11/18/2016   Procedure: REMOVAL FOREIGN BODY EXTREMITY LEFT FOOT;  Surgeon: Caroline Gonzales, DPM;  Location: [place];  Service: Podiatry;  Laterality: Left;   KNEE ARTHROSCOPY WITH MEDIAL MENISECTOMY Right 09/10/2021   Procedure: KNEE ARTHROSCOPY WITH MEDIAL MENISCAL ROOT REPAIR;  Surgeon: Caroline Gash, MD;  Location:  [place];  Service: Orthopedics;  Laterality: Right;   PILONIDAL CYST EXCISION  1990's   RADIOLOGY WITH ANESTHESIA N/A 11/10/2018   Procedure: MRI WITH ANESTHESIA OF BRAIN AND ORBITS WITH AND WITHOUT CONTRAST;  Surgeon: Radiologist, Medication, MD;  Location: [place];  Service: Radiology;  Laterality: N/A;   TENDON REPAIR Left 2011   Left Ankle   TOTAL LAPAROSCOPIC HYSTERECTOMY WITH SALPINGECTOMY Bilateral 02/04/2021   Procedure: TOTAL LAPAROSCOPIC HYSTERECTOMY WITH SALPINGECTOMY;  Surgeon: Caroline Dom, MD;  Location: [place];  Service: Gynecology;  Laterality: Bilateral;   WISDOM TOOTH EXTRACTION  1990's   Patient Active Problem List   Diagnosis Date Noted   Recurrent major depressive disorder, in partial remission ([place]) 09/30/2021   Status post laparoscopic hysterectomy 02/04/2021   S/P laparoscopic hysterectomy 02/04/2021   PTSD (post-traumatic stress disorder) 07/21/2018   GAD (generalized anxiety disorder) 07/21/2018   Insomnia 07/21/2018   MDD (major depressive disorder) 07/21/2018   Nightmares 07/21/2018   Insulin resistance 10/21/2017   Hypertriglyceridemia 10/07/2017   Vitamin D deficiency 10/07/2017   Residual foreign body in soft tissue 11/12/2016   Pre-diabetes 11/02/2016   Morbid obesity with body mass index (BMI) of 50.0 to  59.9 in adult [facility]) 10/15/2016    PCP: Caroline Render, NP  REFERRING PROVIDER: Hiram Gash, MD  REFERRING DIAG: Right knee arthroscopy with medial meniscal root repair on 12/28  THERAPY DIAG:  Pain in joint of right knee  Stiffness of right knee, not elsewhere classified  Muscle weakness (generalized)  Difficulty walking  ONSET DATE: 09/10/2021  SUBJECTIVE:   SUBJECTIVE STATEMENT: Pt states she has few days of walking that caused the R knee to very sore. "It doesn't feel as stable." It just feels sore today.  PERTINENT HISTORY: No previous injury to R knee  PAIN:  Are you having pain? 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 High-grade radial tear of the medial meniscal posterior horn adjacent to the root attachment site. 2. Small focal area of chondral fissuring along the medial trochlea inferiorly. 3. Small joint effusion  PATIENT SURVEYS:  FOTO 31 62 at D/C 19 pts MCII Objective   2 hyper ext 115 flexion Post mob 12  TODAY'S TREATMENT:  2/21  R knee flexion joint mob grade III-IV 4 rounds of 30s each STM R quad  LAQ 3lbs 3x10 Prone quad stretch 30s 3x Stair flexion stretch 10s 10x Standing HR 20x Gastroc stretch 30s 3x Heel toe rocking 2x10  HEP frequency and modifications  2/16  R knee flexion joint mob grade III-IV 4 rounds of 30s each  LAQ RTB 3x10 STS 2x10 from table height Stair step flexion stretch 10s 10x TKE RTB 3x10 Standing HR 20x Heel toe rocking 2x10  HEP frequency and modifications  2/6 Quad set 3x10  SAQ 3x10  SLR 3x10  Heel slide with towel 3x10   PROM into flexion and extension; patellar mobilizations   Seated clamshell: Patient felt that the side lying hip abduction may have caused her some pain the last time  Ankle eversion/ inversion and PF green band x20   Standing:  Weight shift forward and back x20  Side to side with care not to go over mid line of the knee x20  Both  in walker    2/2  Quad set 3x10  SLR 3x10  Heel slide with towel 3x10   PROM into flexion and extension; patellar mobilizations   Seated clamshell: Patient felt that the side lying hip abduction may have caused her some pain the last time  Standing:  Weight shift forward and back x20  Side to side with care not to go over mid line of the knee x20  Both in walker    PATIENT EDUCATION:  Education details: protocol, AD usage/ brace usage, anatomy, exercise progression, DOMS expectations, muscle firing,  envelope of function, HEP, POC  Person educated: Patient Education method: Explanation, Demonstration, Tactile cues, Verbal cues, and Handouts Education comprehension: verbalized understanding, returned demonstration, verbal cues required, and tactile cues required   HOME EXERCISE PROGRAM:  Access Code: [email or web address]  URL: [email or web address] Date: 10/07/2021 Prepared by: Daleen Bo  Exercises Supine Quad Set - 2 x daily - 7 x weekly - 3 sets - 10 reps - 5 hold Active Straight Leg Raise with Quad Set - 2 x daily - 7 x weekly - 2 sets - 10 reps Prone Hip Extension - 2 x daily - 7 x weekly - 2 sets - 10 reps Modified Sidelying Hip Abduction - 2 x daily - 7 x weekly - 2 sets - 10 reps Seated Calf Stretch with Strap - 2 x daily - 7 x weekly - 1 sets - 3 reps - 30 hold    ASSESSMENT:  CLINICAL IMPRESSION: Pt with increased R quad and calf soreness at today's session that is consistent with report of signficantly increased foot contacts since last session. Pt presents with expected R knee stiffness and hypertonicity that was partially relieved with manual therapy. Pt was able to demonstrate normal gait with SPC by end of session. Pt advised to manage volume of steps taken in a day in order to reduce overuse type pain. Plan to reassess stairs and introduce SL stability exercise as tolerated at next session.    Objective impairments include Abnormal gait, decreased  activity tolerance, decreased balance, decreased endurance, decreased mobility, difficulty walking, decreased ROM, decreased strength, hypomobility, increased edema, increased fascial restrictions, increased muscle spasms, impaired flexibility, improper body mechanics, postural dysfunction, obesity, and pain. These impairments are limiting patient from cleaning, community activity, driving, meal prep, occupation, laundry, yard work, shopping, and exercise . Personal factors including Fitness, Profession, Time since onset of injury/illness/exacerbation, and 1-2 comorbidities:    are also affecting patient's functional outcome. Patient will benefit from skilled PT to address above impairments and improve overall function.  REHAB POTENTIAL: Good  CLINICAL DECISION MAKING: Stable/uncomplicated  EVALUATION COMPLEXITY: Low   GOALS:  SHORT TERM GOALS:  STG Name Target Date Goal status 2/6  1 Pt will become independent with HEP in order to demonstrate synthesis of PT education.  Baseline:  12/16/2021 Performing at home  Achieved   2 Pt will score at least 19 pt increase on FOTO to demonstrate functional improvement in MCII and pt perceived function.   Baseline:  12/16/2021 Not tested   3 Pt will be able to demonstrate ability to walk with FWB in order to demonstrate functional improvement in LE function for self-care and house hold duties.  Baseline: 12/16/2021 Progressing with her weight bearing    LONG TERM GOALS:   LTG Name Target Date Goal status  1 Pt  will become independent with final HEP in order to demonstrate synthesis of PT education.   01/27/2022 INITIAL  2 Pt will score >/= 62 on FOTO to demonstrate improvement in perceived R LE function.  01/27/2022 INITIAL  3 Pt will be able to perform 5XSTS in under 12s  in order to demonstrate functional improvement above the cut off score for adults.    01/27/2022 INITIAL  4 Pt will be able to demonstrate/report ability to walk >15 mins without  pain in order to demonstrate functional improvement and tolerance to exercise and community mobility.   01/27/2022 INITIAL   PLAN: PT FREQUENCY: 1-2x/week  PT DURATION: 12 weeks  PLANNED INTERVENTIONS: Therapeutic exercises, Therapeutic activity, Neuro Muscular re-education, Balance training, Gait training, Patient/Family education, Joint mobilization, Stair training, Orthotic/Fit training, DME instructions, Aquatic Therapy, Dry Needling, Electrical stimulation, Spinal mobilization, Cryotherapy, Moist heat, Taping, Vasopneumatic device, Traction, Ultrasound, Ionotophoresis 4mg /ml Dexamethasone, and Manual therapy  PLAN FOR NEXT  SESSION(s): review HEP, small step up, tandem balance, LAQ with weight, sidestepping; SLS as tolerated  Daleen Bo PT, DPT 11/04/21 3:57 PM

## 2021-11-05 DIAGNOSIS — F4310 Post-traumatic stress disorder, unspecified: Secondary | ICD-10-CM | POA: Diagnosis not present

## 2021-11-06 ENCOUNTER — Other Ambulatory Visit: Payer: Self-pay | Admitting: Physician Assistant

## 2021-11-06 ENCOUNTER — Other Ambulatory Visit (HOSPITAL_COMMUNITY): Payer: Self-pay

## 2021-11-06 MED ORDER — CLONAZEPAM 0.5 MG PO TABS
0.5000 mg | ORAL_TABLET | Freq: Four times a day (QID) | ORAL | 5 refills | Status: DC | PRN
  Filled 2021-11-06: qty 120, 30d supply, fill #0
  Filled 2021-12-14: qty 120, 30d supply, fill #1
  Filled 2022-01-18: qty 120, 30d supply, fill #2
  Filled 2022-02-26: qty 120, 30d supply, fill #3
  Filled 2022-04-16: qty 120, 30d supply, fill #4

## 2021-11-11 ENCOUNTER — Ambulatory Visit (HOSPITAL_BASED_OUTPATIENT_CLINIC_OR_DEPARTMENT_OTHER): Payer: 59 | Admitting: Physical Therapy

## 2021-11-11 ENCOUNTER — Emergency Department (HOSPITAL_COMMUNITY): Payer: 59

## 2021-11-11 ENCOUNTER — Encounter (HOSPITAL_COMMUNITY): Payer: Self-pay | Admitting: Emergency Medicine

## 2021-11-11 ENCOUNTER — Emergency Department (HOSPITAL_COMMUNITY)
Admission: EM | Admit: 2021-11-11 | Discharge: 2021-11-11 | Disposition: A | Discharge status: Home / Self Care | Payer: 59 | Attending: Emergency Medicine | Admitting: Emergency Medicine

## 2021-11-11 DIAGNOSIS — R110 Nausea: Secondary | ICD-10-CM | POA: Diagnosis not present

## 2021-11-11 DIAGNOSIS — Z79899 Other long term (current) drug therapy: Secondary | ICD-10-CM | POA: Diagnosis not present

## 2021-11-11 DIAGNOSIS — R079 Chest pain, unspecified: Secondary | ICD-10-CM | POA: Diagnosis not present

## 2021-11-11 DIAGNOSIS — Z794 Long term (current) use of insulin: Secondary | ICD-10-CM | POA: Insufficient documentation

## 2021-11-11 DIAGNOSIS — Z96659 Presence of unspecified artificial knee joint: Secondary | ICD-10-CM | POA: Insufficient documentation

## 2021-11-11 DIAGNOSIS — E119 Type 2 diabetes mellitus without complications: Secondary | ICD-10-CM | POA: Insufficient documentation

## 2021-11-11 DIAGNOSIS — R0789 Other chest pain: Secondary | ICD-10-CM | POA: Diagnosis not present

## 2021-11-11 DIAGNOSIS — R072 Precordial pain: Secondary | ICD-10-CM | POA: Insufficient documentation

## 2021-11-11 DIAGNOSIS — R7401 Elevation of levels of liver transaminase levels: Secondary | ICD-10-CM | POA: Insufficient documentation

## 2021-11-11 DIAGNOSIS — Z91040 Latex allergy status: Secondary | ICD-10-CM | POA: Diagnosis not present

## 2021-11-11 DIAGNOSIS — R0602 Shortness of breath: Secondary | ICD-10-CM | POA: Diagnosis not present

## 2021-11-11 LAB — URINALYSIS, ROUTINE W REFLEX MICROSCOPIC
Bilirubin Urine: NEGATIVE
Glucose, UA: NEGATIVE mg/dL
Hgb urine dipstick: NEGATIVE
Ketones, ur: NEGATIVE mg/dL
Leukocytes,Ua: NEGATIVE
Nitrite: POSITIVE — AB
Protein, ur: NEGATIVE mg/dL
Specific Gravity, Urine: 1.025 (ref 1.005–1.030)
pH: 6 (ref 5.0–8.0)

## 2021-11-11 LAB — COMPREHENSIVE METABOLIC PANEL
ALT: 35 U/L (ref 0–44)
AST: 42 U/L — ABNORMAL HIGH (ref 15–41)
Albumin: 3.8 g/dL (ref 3.5–5.0)
Alkaline Phosphatase: 108 U/L (ref 38–126)
Anion gap: 8 (ref 5–15)
BUN: 15 mg/dL (ref 6–20)
CO2: 25 mmol/L (ref 22–32)
Calcium: 8.8 mg/dL — ABNORMAL LOW (ref 8.9–10.3)
Chloride: 102 mmol/L (ref 98–111)
Creatinine, Ser: 1 mg/dL (ref 0.44–1.00)
GFR, Estimated: >60 mL/min (ref >60)
Glucose, Bld: 103 mg/dL — ABNORMAL HIGH (ref 70–99)
Potassium: 4.2 mmol/L (ref 3.5–5.1)
Sodium: 135 mmol/L (ref 135–145)
Total Bilirubin: 0.4 mg/dL (ref 0.3–1.2)
Total Protein: 8.1 g/dL (ref 6.5–8.1)

## 2021-11-11 LAB — CBC
HCT: 38.3 % (ref 36.0–46.0)
Hemoglobin: 12.5 g/dL (ref 12.0–15.0)
MCH: 28.3 pg (ref 26.0–34.0)
MCHC: 32.6 g/dL (ref 30.0–36.0)
MCV: 86.8 fL (ref 80.0–100.0)
Platelets: 291 10*3/uL (ref 150–400)
RBC: 4.41 MIL/uL (ref 3.87–5.11)
RDW: 14.4 % (ref 11.5–15.5)
WBC: 7.3 10*3/uL (ref 4.0–10.5)
nRBC: 0 % (ref 0.0–0.2)

## 2021-11-11 LAB — TROPONIN I (HIGH SENSITIVITY)
Troponin I (High Sensitivity): <2 ng/L (ref <18)
Troponin I (High Sensitivity): <2 ng/L (ref <18)

## 2021-11-11 LAB — LIPASE, BLOOD: Lipase: 50 U/L (ref 11–51)

## 2021-11-11 MED ORDER — ALUM & MAG HYDROXIDE-SIMETH 200-200-20 MG/5ML PO SUSP
15.0000 mL | Freq: Once | ORAL | Status: AC
  Administered 2021-11-11: 15 mL via ORAL
  Filled 2021-11-11: qty 30

## 2021-11-11 NOTE — ED Provider Notes (Signed)
[facility] DEPT Provider Note   CSN: [id number] Arrival date & time: 11/11/21  1006     History  Chief Complaint  Patient presents with   Chest Pain    Caroline Gonzales is a 46 y.o. female.  The history is provided by the patient, medical records and a significant other.  Chest Pain  Patient with medical history notable for PTSD, anxiety, prediabetes, BMI greater than 35, presents today due to chest pain.  Chest pain is substernal, feels sharp.  Does not radiate elsewhere although this morning she did have a tingling to her left upper extremity that resolved after a few minutes.  Chest pain currently is intermittent, last for 5 minutes when it occurs.  It is worse when she lays down flat, better with leaning forward.  Associated with nausea and intermittent shortness of breath, not worse with inspiration.  No vomiting, no lower extremity swelling.  No previous Mis or blood clot.  Patient had knee replacement in December, no complications since then.  Denies lower extremity swelling, not on oral birth control, no recent travel.  Patient did start Ozempic for prediabetes 6 weeks ago, taking as prescribed and no other complications.    Home Medications Prior to Admission medications   Medication Sig Start Date End Date Taking? Authorizing Provider  Carboxymethylcellul-Glycerin (LUBRICATING EYE DROPS OP) Place 1 drop into both eyes daily as needed (dryness/ allergies).    [provider]  cetirizine (ZYRTEC) 10 MG tablet Take 10 mg by mouth at bedtime.    [provider]  Cholecalciferol (VITAMIN D3) 5000 units CAPS Take 5,000 Units by mouth at bedtime.    [provider]  clonazePAM (KLONOPIN) 0.5 MG tablet Take 1 tablet (0.5 mg total) by mouth every 6 (six) hours as needed for anxiety. 11/06/21   Donnal Moat T, PA-C  Continuous Blood Gluc Sensor (FREESTYLE LIBRE 3 SENSOR) MISC Place 1 sensor on the skin every 14 days. Use to  check glucose continuously 09/30/21   Early, Coralee Pesa, NP  esomeprazole (NEXIUM) 20 MG capsule Take 20 mg by mouth at bedtime.    [provider]  fluvoxaMINE (LUVOX) 100 MG tablet Take 1 tablet (100 mg total) by mouth at bedtime. Take with 50 mg dose to equal 150 mg at bedtime 09/01/21   Donnal Moat T, PA-C  fluvoxaMINE (LUVOX) 50 MG tablet TAKE 1 TABLET BY MOUTH AT BEDTIME. TAKE IN ADDITION TO THE 100 MG TABLET TO EQUAL 150 MG TOTAL 05/06/21   Donnal Moat T, PA-C  gabapentin (NEURONTIN) 300 MG capsule Take 2-3 capsules (600-900 mg total) by mouth at bedtime. 10/07/21   Donnal Moat T, PA-C  magic mouthwash (nystatin, lidocaine, diphenhydrAMINE, alum & mag hydroxide) suspension Swish and spit 5 mLs 3 (three) times daily. 10/29/21   [name], NP  metFORMIN (GLUCOPHAGE) 500 MG tablet Take 1 tablet (500 mg total) by mouth 2 (two) times daily with a meal. Patient taking differently: Take 500 mg by mouth daily with breakfast. 05/22/21   Bary Castilla, NP  methocarbamol (ROBAXIN) 500 MG tablet Take 1 tablet by mouth at bedtime as needed for muscle spasms Patient not taking: Reported on 09/18/2021 09/04/21     Multiple Vitamins-Minerals (HAIR SKIN AND NAILS FORMULA PO)  04/14/21   [provider]  ondansetron (ZOFRAN-ODT) 8 MG disintegrating tablet Dissolve 1 tablet by mouth every 8 hours as needed for nausea. 09/30/21   [name], NP  prazosin (MINIPRESS) 2 MG capsule  Take 4 capsules (8 mg total) by mouth at bedtime. 07/31/21   Donnal Moat T, PA-C  propranolol (INDERAL) 40 MG tablet TAKE 1 TABLET (40 MG TOTAL) BY MOUTH 2 (TWO) TIMES DAILY. Patient taking differently: Take 40 mg by mouth 2 (two) times daily. 10/03/20 10/10/21  Donnal Moat T, PA-C  QUEtiapine (SEROQUEL) 200 MG tablet Take 1/2-1 tablet (100-200 mg total) by mouth at bedtime. Patient taking differently: Take 200 mg by mouth at bedtime. 05/06/21   Donnal Moat T, PA-C  QUEtiapine (SEROQUEL) 300 MG tablet Take 1  tablet (300 mg total) by mouth at bedtime. 05/06/21   Addison Lank, PA-C  Semaglutide, 1 MG/DOSE, 4 MG/3ML SOPN Inject 1 mg as directed once a week. (third pen) 09/30/21   Early, Coralee Pesa, NP  Semaglutide,0.25 or 0.5MG /DOS, (OZEMPIC, 0.25 OR 0.5 MG/DOSE,) 2 MG/1.5ML SOPN Inject 0.25 mg into the skin once a week for 28 days, then 0.5 mg once a week for 14 days. 09/30/21 11/11/21  [name], NP  Semaglutide,0.25 or 0.5MG /DOS, 2 MG/1.5ML SOPN Inject 0.5 mg into the skin once a week.(second pen) 09/30/21   Early, Coralee Pesa, NP  triamcinolone (NASACORT) 55 MCG/ACT AERO nasal inhaler Place 1 spray into the nose at bedtime.    [provider]      Allergies    Penicillins, Tamiflu [oseltamivir phosphate], Cephalosporins, and Latex    Review of Systems   Review of Systems  Cardiovascular:  Positive for chest pain.   Physical Exam Updated Vital Signs BP 112/65 (BP Location: Left Wrist)    Pulse 77    Temp 97.7 F (36.5 C) (Oral)    Resp 18    Ht 5\' 5"  (1.651 m)    Wt 128.4 kg    LMP 01/06/2021    SpO2 99%    BMI 47.09 kg/m  Physical Exam Vitals and nursing note reviewed. Exam conducted with a chaperone present.  Constitutional:      Appearance: Normal appearance. She is obese.  HENT:     Head: Normocephalic and atraumatic.  Eyes:     General: No scleral icterus.       Right eye: No discharge.        Left eye: No discharge.     Extraocular Movements: Extraocular movements intact.     Pupils: Pupils are equal, round, and reactive to light.  Cardiovascular:     Rate and Rhythm: Normal rate and regular rhythm.     Pulses: Normal pulses.     Heart sounds: Normal heart sounds. No murmur heard.   No friction rub. No gallop.  Pulmonary:     Effort: Pulmonary effort is normal. No respiratory distress.     Breath sounds: Normal breath sounds.  Chest:     Chest wall: Tenderness present.     Comments: Chest wall tenderness reproducible Abdominal:     General: Abdomen is flat. Bowel sounds  are normal. There is no distension.     Palpations: Abdomen is soft.     Tenderness: There is no abdominal tenderness.  Musculoskeletal:     Right lower leg: No edema.     Left lower leg: No edema.  Skin:    General: Skin is warm and dry.     Coloration: Skin is not jaundiced.  Neurological:     Mental Status: She is alert. Mental status is at baseline.     Coordination: Coordination normal.    ED Results / Procedures / Treatments   Labs (  all labs ordered are listed, but only abnormal results are displayed) Labs Reviewed  URINALYSIS, ROUTINE W REFLEX MICROSCOPIC - Abnormal; Notable for the following components:      Result Value   APPearance HAZY (*)    Nitrite POSITIVE (*)    Bacteria, UA FEW (*)    All other components within normal limits  COMPREHENSIVE METABOLIC PANEL - Abnormal; Notable for the following components:   Glucose, Bld 103 (*)    Calcium 8.8 (*)    AST 42 (*)    All other components within normal limits  CBC  LIPASE, BLOOD  TROPONIN I (HIGH SENSITIVITY)  TROPONIN I (HIGH SENSITIVITY)    EKG None  Radiology DG Chest 2 View  Result Date: 11/11/2021 CLINICAL DATA:  Chest pain EXAM: CHEST - 2 VIEW COMPARISON:  11/14/2020 FINDINGS: The heart size and mediastinal contours are within normal limits. Both lungs are clear. The visualized skeletal structures are unremarkable. IMPRESSION: No active cardiopulmonary disease. Electronically Signed   By: Franchot Gallo M.D.   On: 11/11/2021 10:59    Procedures Procedures    Medications Ordered in ED Medications  alum & mag hydroxide-simeth (MAALOX/MYLANTA) 200-200-20 MG/5ML suspension 15 mL (15 mLs Oral Given 11/11/21 1404)    ED Course/ Medical Decision Making/ A&P                           Medical Decision Making Amount and/or Complexity of Data Reviewed Labs: ordered. Radiology: ordered.  Risk OTC drugs.   This patient presents to the ED for concern of chest pain, this involves an extensive number of  treatment options, and is a complaint that carries with it a high risk of complications and morbidity.  The differential diagnosis includes ACS, pericarditis, pneumonia, GERD, esophageal rupture, dissection  Patients presentation is complicated by their history of treated diabetes with Ozempic, also recent history of surgery roughly 2 months prior.   Additional history obtained:   Independent historian: Patient spouse who is at bedside  I reviewed record from PCP.  Recently started on Ozempic, taking as prescribed without complications indicated.  Additionally saw patient in surgery in December, not currently on any anticoagulation.     Lab Tests:  I ordered, viewed, and personally interpreted labs.  The pertinent results include: CBC without any signs of leukocytosis or anemia.  Lipase within normal limits, CMP due to without any AKI, slight elevation of AST of 42 but not contributable.  UA shows nitrate positive but no leukocytosis.  Additionally no UTI symptoms.  Negative delta Trope.    Imaging Studies ordered:  I directly visualized the chest x-ray, which showed no acute process  I agree with the radiologist interpretation    ECG/Cardiac monitoring:   Per my interpretation, EKG shows sinus rhythm with a heart rate of 92  The patient was maintained on a cardiac monitor.  Visualized monitor strip which showed sinus rhythm with a heart rate of 77 per my interpretation.    Medicines ordered and prescription drug management:  I ordered medication including: Maalox  I have reviewed the patients home medicines and have made adjustments as needed   Test Considered: Although I did consider ordering a dimer versus CTA for evaluation for possible PE, ultimately patient's symptoms do not appear consistent with PE.  Pain is not pleuritic, has been greater than 2 months since the surgery and she is ambulatory.  We engaged in shared decision-making, patient also was in agreement with  abstaining from additional work-up at this time.  She is not tachycardic, not hypoxic.  Heart score 2.    Reevaluation:  After the interventions noted above, I reevaluated the patient and found patient improved.    Problems addressed / ED Course: 46 year old presenting due to chest pain.  Reproducible, she is not hypoxic or tachypneic or tachycardic.  Considered PE but ultimately feel less likely given pain is not pleuritic and is indicated by work-up above.  ACS considered but given negative delta Trope and no ischemic findings on EKG seems unlikely.  Considered pancreatitis given recent start of Ozempic, lipase is low and patient's pain is more substernal than epigastric.  Does not appear consistent with esophageal rupture or dissection.  Patient's pain is improved, intermittent.     Disposition:   After consideration of the diagnostic results and the patients response to treatment, I feel that the patent would benefit from outpatient follow-up with PCP.            Final Clinical Impression(s) / ED Diagnoses Final diagnoses:  Chest pain, unspecified type    Rx / DC Orders ED Discharge Orders     None         Sherrill Raring, PA-C 11/11/21 1519    Godfrey Pick, MD 11/12/21 [phone]

## 2021-11-11 NOTE — ED Triage Notes (Signed)
Per pt, states epigastric pain radiating to left shoulder-started around 0730-

## 2021-11-11 NOTE — Discharge Instructions (Addendum)
Take Tylenol and Motrin as needed for pain.  Follow-up with your primary in the next few days.  If symptoms worsen return back to the ED for additional evaluation.

## 2021-11-11 NOTE — ED Provider Triage Note (Signed)
Emergency Medicine Provider Triage Evaluation Note  Caroline Gonzales , a 46 y.o. female  was evaluated in triage.  Pt complains of chest pain. Started this AM. Constant, radiates to left arm.  Review of Systems  Positive: CP, N, V Negative:   Physical Exam  BP (!) 161/95 (BP Location: Left Arm)    Pulse 93    Temp 97.7 F (36.5 C) (Oral)    Resp 18    Ht 5\' 5"  (1.651 m)    Wt 128.4 kg    LMP 01/06/2021    SpO2 92%    BMI 47.09 kg/m  Gen:   Awake, no distress   Resp:  Normal effort  MSK:   Moves extremities without difficulty  Other:    Medical Decision Making  Medically screening exam initiated at 10:52 AM.  Appropriate orders placed.  Caroline Gonzales was informed that the remainder of the evaluation will be completed by another provider, this initial triage assessment does not replace that evaluation, and the importance of remaining in the ED until their evaluation is complete.  Cp workup   Caroline Gonzales, [place] 11/11/21 1054

## 2021-11-12 DIAGNOSIS — F4310 Post-traumatic stress disorder, unspecified: Secondary | ICD-10-CM | POA: Diagnosis not present

## 2021-11-13 ENCOUNTER — Other Ambulatory Visit (HOSPITAL_COMMUNITY): Payer: Self-pay

## 2021-11-13 ENCOUNTER — Ambulatory Visit (HOSPITAL_BASED_OUTPATIENT_CLINIC_OR_DEPARTMENT_OTHER): Payer: 59 | Admitting: Nurse Practitioner

## 2021-11-13 ENCOUNTER — Encounter (HOSPITAL_BASED_OUTPATIENT_CLINIC_OR_DEPARTMENT_OTHER): Payer: Self-pay | Admitting: Nurse Practitioner

## 2021-11-13 ENCOUNTER — Other Ambulatory Visit: Payer: Self-pay

## 2021-11-13 VITALS — BP 118/64 | HR 72 | Resp 18 | Ht 65.0 in | Wt 279.0 lb

## 2021-11-13 DIAGNOSIS — M940 Chondrocostal junction syndrome [Tietze]: Secondary | ICD-10-CM

## 2021-11-13 MED ORDER — CYCLOBENZAPRINE HCL 10 MG PO TABS
10.0000 mg | ORAL_TABLET | Freq: Three times a day (TID) | ORAL | 2 refills | Status: DC | PRN
  Filled 2021-11-13: qty 30, 10d supply, fill #0

## 2021-11-13 MED ORDER — MELOXICAM 15 MG PO TABS
15.0000 mg | ORAL_TABLET | Freq: Every day | ORAL | 3 refills | Status: DC
  Filled 2021-11-13: qty 30, 30d supply, fill #0

## 2021-11-13 NOTE — Progress Notes (Signed)
Acute Office Visit  Subjective:    Patient ID: Caroline Gonzales, female    DOB: 1976/07/04, 46 y.o.   MRN: [id number]  Chief Complaint  Patient presents with   Follow-up    Patient presents today to follow up from ED visit. She feels that this is muscular. She recently switched from a walker to a cane and feels this is the problem.    HPI Patient is in today for follow-up from ED visit on Monday of this week for chest pain.  Ed reports shows reproducible CP worse with laying back and better with leaning forward. No ACS s/s present. EKG and lab work negative.   Today Caroline Gonzales reports that her pain is still present but different in nature at this time. She endorses continued pain in the chest wall, but tells me that it feels to be MSK related across her chest, upper back, shoulders, and ribs. She reports that she has been walking with the assistance of a walker for the past several weeks. She recently transitioned to a cane and shortly thereafter noticed the pain.  She denies shortness of breath, palpitations, sweating, dizziness, or nausea at this time.  She has not had any new or worsening symptoms.  She tells me that she tried antacid medication and this did not help with symptoms at all.     Past Surgical History:  Procedure Laterality Date   ABDOMINAL HYSTERECTOMY  01/2021   ANKLE ARTHROSCOPY Left 2011   CESAREAN SECTION  06/2006   x 1   CHOLECYSTECTOMY  07/2006   laparoscopic   CYSTOSCOPY N/A 02/04/2021   Procedure: CYSTOSCOPY;  Surgeon: Salvadore Dom, MD;  Location: [facility];  Service: Gynecology;  Laterality: N/A;   DILATION AND CURETTAGE OF UTERUS  1997   x 2   FOREIGN BODY REMOVAL Left 11/18/2016   Procedure: REMOVAL FOREIGN BODY EXTREMITY LEFT FOOT;  Surgeon: Trula Slade, DPM;  Location: [facility];  Service: Podiatry;  Laterality: Left;   KNEE ARTHROSCOPY WITH MEDIAL MENISECTOMY Right 09/10/2021   Procedure: KNEE ARTHROSCOPY WITH MEDIAL MENISCAL ROOT REPAIR;   Surgeon: Hiram Gash, MD;  Location: [facility];  Service: Orthopedics;  Laterality: Right;   PILONIDAL CYST EXCISION  1990's   RADIOLOGY WITH ANESTHESIA N/A 11/10/2018   Procedure: MRI WITH ANESTHESIA OF BRAIN AND ORBITS WITH AND WITHOUT CONTRAST;  Surgeon: Radiologist, Medication, MD;  Location: [facility];  Service: Radiology;  Laterality: N/A;   TENDON REPAIR Left 2011   Left Ankle   TOTAL LAPAROSCOPIC HYSTERECTOMY WITH SALPINGECTOMY Bilateral 02/04/2021   Procedure: TOTAL LAPAROSCOPIC HYSTERECTOMY WITH SALPINGECTOMY;  Surgeon: Salvadore Dom, MD;  Location: [facility];  Service: Gynecology;  Laterality: Bilateral;   WISDOM TOOTH EXTRACTION  1990's    Outpatient Medications Prior to Visit  Medication Sig Dispense Refill   Carboxymethylcellul-Glycerin (LUBRICATING EYE DROPS OP) Place 1 drop into both eyes daily as needed (dryness/ allergies).     cetirizine (ZYRTEC) 10 MG tablet Take 10 mg by mouth at bedtime.     Cholecalciferol (VITAMIN D3) 5000 units CAPS Take 5,000 Units by mouth at bedtime.     clonazePAM (KLONOPIN) 0.5 MG tablet Take 1 tablet (0.5 mg total) by mouth every 6 (six) hours as needed for anxiety. 120 tablet 5   Continuous Blood Gluc Sensor (FREESTYLE LIBRE 3 SENSOR) MISC Place 1 sensor on the skin every 14 days. Use to check glucose continuously 2 each 11   esomeprazole (NEXIUM) 20 MG capsule Take  20 mg by mouth at bedtime.     fluvoxaMINE (LUVOX) 100 MG tablet Take 1 tablet (100 mg total) by mouth at bedtime. Take with 50 mg dose to equal 150 mg at bedtime 90 tablet 1   fluvoxaMINE (LUVOX) 50 MG tablet TAKE 1 TABLET BY MOUTH AT BEDTIME. TAKE IN ADDITION TO THE 100 MG TABLET TO EQUAL 150 MG TOTAL 90 tablet 1   gabapentin (NEURONTIN) 300 MG capsule Take 2-3 capsules (600-900 mg total) by mouth at bedtime. 270 capsule 0   magic mouthwash (nystatin, lidocaine, diphenhydrAMINE, alum & mag hydroxide) suspension Swish and spit 5 mLs 3 (three) times daily. 180 mL 0    metFORMIN (GLUCOPHAGE) 500 MG tablet Take 1 tablet (500 mg total) by mouth 2 (two) times daily with a meal. (Patient taking differently: Take 500 mg by mouth daily with breakfast.) 60 tablet 11   Multiple Vitamins-Minerals (HAIR SKIN AND NAILS FORMULA PO)      ondansetron (ZOFRAN-ODT) 8 MG disintegrating tablet Dissolve 1 tablet by mouth every 8 hours as needed for nausea. 20 tablet 3   prazosin (MINIPRESS) 2 MG capsule Take 4 capsules (8 mg total) by mouth at bedtime. 360 capsule 1   propranolol (INDERAL) 40 MG tablet TAKE 1 TABLET (40 MG TOTAL) BY MOUTH 2 (TWO) TIMES DAILY. (Patient taking differently: Take 40 mg by mouth 2 (two) times daily.) 180 tablet 3   QUEtiapine (SEROQUEL) 200 MG tablet Take 1/2-1 tablet (100-200 mg total) by mouth at bedtime. (Patient taking differently: Take 200 mg by mouth at bedtime.) 90 tablet 1   QUEtiapine (SEROQUEL) 300 MG tablet Take 1 tablet (300 mg total) by mouth at bedtime. 90 tablet 1   Semaglutide, 1 MG/DOSE, 4 MG/3ML SOPN Inject 1 mg as directed once a week. (third pen) 3 mL 3   Semaglutide,0.25 or 0.5MG/DOS, 2 MG/1.5ML SOPN Inject 0.5 mg into the skin once a week.(second pen) 1.5 mL 0   triamcinolone (NASACORT) 55 MCG/ACT AERO nasal inhaler Place 1 spray into the nose at bedtime.     methocarbamol (ROBAXIN) 500 MG tablet Take 1 tablet by mouth at bedtime as needed for muscle spasms (Patient not taking: Reported on 09/18/2021) 20 tablet 0   No facility-administered medications prior to visit.    Allergies  Allergen Reactions   Penicillins Shortness Of Breath and Rash    Did it involve swelling of the face/tongue/throat, SOB, or low BP? Yes Did it involve sudden or severe rash/hives, skin peeling, or any reaction on the inside of your mouth or nose? Yes Did you need to seek medical attention at a hospital or doctor's office? Yes When did it last happen?      childhood allergy If all above answers are "NO", may proceed with cephalosporin use.     Tamiflu  [Oseltamivir Phosphate] Anaphylaxis   Cephalosporins Rash   Latex Rash    Review of Systems All review of systems negative except what is listed in the HPI     Objective:    Physical Exam Nursing note reviewed.  Constitutional:      Appearance: She is obese.  Eyes:     Extraocular Movements: Extraocular movements intact.     Conjunctiva/sclera: Conjunctivae normal.     Pupils: Pupils are equal, round, and reactive to light.  Neck:     Vascular: No carotid bruit.  Cardiovascular:     Rate and Rhythm: Normal rate and regular rhythm.     Pulses: Normal pulses.  Heart sounds: Normal heart sounds. No murmur heard. Pulmonary:     Effort: Pulmonary effort is normal.     Breath sounds: Normal breath sounds. No wheezing.  Chest:     Chest wall: Tenderness present.  Abdominal:     General: There is no distension.     Palpations: Abdomen is soft.     Tenderness: There is no abdominal tenderness. There is no guarding.  Musculoskeletal:        General: Tenderness present.     Cervical back: Normal range of motion.     Right lower leg: No edema.     Left lower leg: No edema.     Comments: Spasms noted in cervical and thoracic spinal musculature with tenderness with deep palpation. Chest wall palpation elicits discrete tenderness present at multiple points on anterior and posterior chest wall. ROM intact.   Lymphadenopathy:     Cervical: No cervical adenopathy.  Skin:    Capillary Refill: Capillary refill takes less than 2 seconds.  Neurological:     General: No focal deficit present.     Mental Status: She is alert and oriented to person, place, and time.     Gait: Gait abnormal.  Psychiatric:        Mood and Affect: Mood normal.        Behavior: Behavior normal.        Thought Content: Thought content normal.        Judgment: Judgment normal.    Ht _0  (1.651 m)    Wt 279 lb (126.6 kg)    LMP 01/06/2021    BMI 46.43 kg/m  Wt Readings from Last 3 Encounters:  11/13/21  279 lb (126.6 kg)  11/11/21 283 lb (128.4 kg)  09/30/21 (!) 303 lb 14.4 oz (137.8 kg)    Health Maintenance Due  Topic Date Due   COLONOSCOPY (Pts 45-68yr Insurance coverage will need to be confirmed)  Never done    There are no preventive care reminders to display for this patient.   Lab Results  Component Value Date   TSH 1.70 01/20/2021   Lab Results  Component Value Date   WBC 7.3 11/11/2021   HGB 12.5 11/11/2021   HCT 38.3 11/11/2021   MCV 86.8 11/11/2021   PLT 291 11/11/2021   Lab Results  Component Value Date   NA 135 11/11/2021   K 4.2 11/11/2021   CO2 25 11/11/2021   GLUCOSE 103 (H) 11/11/2021   BUN 15 11/11/2021   CREATININE 1.00 11/11/2021   BILITOT 0.4 11/11/2021   ALKPHOS 108 11/11/2021   AST 42 (H) 11/11/2021   ALT 35 11/11/2021   PROT 8.1 11/11/2021   ALBUMIN 3.8 11/11/2021   CALCIUM 8.8 (L) 11/11/2021   ANIONGAP 8 11/11/2021   EGFR 76 05/22/2021   Lab Results  Component Value Date   CHOL 172 05/22/2021   Lab Results  Component Value Date   HDL 39 (L) 05/22/2021   Lab Results  Component Value Date   LDLCALC 94 05/22/2021   Lab Results  Component Value Date   TRIG 230 (H) 05/22/2021   Lab Results  Component Value Date   CHOLHDL 4.4 05/22/2021   Lab Results  Component Value Date   HGBA1C 6.2 (H) 09/30/2021       Assessment & Plan:   Problem List Items Addressed This Visit     Acute costochondritis - Primary    Reproducible chest wall tenderness noted on anterior and posterior surface of  the body with no signs of edema or trauma present. No alarm symptoms present today. Lungs clear to auscultation. Review of ED testing today.  Given history of recent change in upper body exertion and posture aligned with lack of symptoms of ACS and recent ED work-up with limited relief with antacid treatment, strongly suspect MSK nature to include costochondritis.  Recommend flexeril at bedtime with NSAIDs during the day to help with  inflammation and spasms.  Handout provided with stretching instructions.  Patient will follow-up if symptoms worsen or fail to improve.       Relevant Medications   cyclobenzaprine (FLEXERIL) 10 MG tablet   meloxicam (MOBIC) 15 MG tablet     Meds ordered this encounter  Medications   cyclobenzaprine (FLEXERIL) 10 MG tablet    Sig: Take 1 tablet by mouth 3 times daily as needed for muscle spasms.    Dispense:  30 tablet    Refill:  2   meloxicam (MOBIC) 15 MG tablet    Sig: Take 1 tablet by mouth daily.    Dispense:  30 tablet    Refill:  3     [name], NP

## 2021-11-13 NOTE — Patient Instructions (Signed)
We will try the stretches and the flexeril at bedtime with meloxicam during the day to see if this helps. If this isn't better in the next few days let me know and we can always try steroids if needed.  ?

## 2021-11-14 DIAGNOSIS — M940 Chondrocostal junction syndrome [Tietze]: Secondary | ICD-10-CM | POA: Insufficient documentation

## 2021-11-14 HISTORY — DX: Chondrocostal junction syndrome (tietze): M94.0

## 2021-11-14 NOTE — Assessment & Plan Note (Signed)
Reproducible chest wall tenderness noted on anterior and posterior surface of the body with no signs of edema or trauma present. No alarm symptoms present today. Lungs clear to auscultation. Review of ED testing today.  ?Given history of recent change in upper body exertion and posture aligned with lack of symptoms of ACS and recent ED work-up with limited relief with antacid treatment, strongly suspect MSK nature to include costochondritis.  ?Recommend flexeril at bedtime with NSAIDs during the day to help with inflammation and spasms.  ?Handout provided with stretching instructions.  ?Patient will follow-up if symptoms worsen or fail to improve.  ?

## 2021-11-16 ENCOUNTER — Other Ambulatory Visit: Payer: Self-pay | Admitting: Physician Assistant

## 2021-11-17 ENCOUNTER — Other Ambulatory Visit (HOSPITAL_COMMUNITY): Payer: Self-pay

## 2021-11-18 ENCOUNTER — Other Ambulatory Visit: Payer: Self-pay

## 2021-11-18 ENCOUNTER — Encounter (HOSPITAL_BASED_OUTPATIENT_CLINIC_OR_DEPARTMENT_OTHER): Payer: Self-pay | Admitting: Physical Therapy

## 2021-11-18 ENCOUNTER — Other Ambulatory Visit (HOSPITAL_COMMUNITY): Payer: Self-pay

## 2021-11-18 ENCOUNTER — Ambulatory Visit (HOSPITAL_BASED_OUTPATIENT_CLINIC_OR_DEPARTMENT_OTHER): Payer: 59 | Attending: Orthopaedic Surgery | Admitting: Physical Therapy

## 2021-11-18 DIAGNOSIS — M25561 Pain in right knee: Secondary | ICD-10-CM | POA: Diagnosis not present

## 2021-11-18 DIAGNOSIS — M6281 Muscle weakness (generalized): Secondary | ICD-10-CM | POA: Insufficient documentation

## 2021-11-18 DIAGNOSIS — R262 Difficulty in walking, not elsewhere classified: Secondary | ICD-10-CM | POA: Insufficient documentation

## 2021-11-18 DIAGNOSIS — M25661 Stiffness of right knee, not elsewhere classified: Secondary | ICD-10-CM | POA: Insufficient documentation

## 2021-11-18 MED ORDER — QUETIAPINE FUMARATE 300 MG PO TABS
300.0000 mg | ORAL_TABLET | Freq: Every evening | ORAL | 1 refills | Status: DC
  Filled 2021-11-18: qty 90, 90d supply, fill #0
  Filled 2022-02-15: qty 90, 90d supply, fill #1

## 2021-11-18 NOTE — Therapy (Signed)
OUTPATIENT PHYSICAL THERAPY PROGRESS NOTE   Patient Name: Caroline Gonzales MRN: [id number] DOB:Sep 08, 1976, 46 y.o., female Today's Date: 11/18/2021   PT End of Session - 11/18/21 1520     Visit Number 7    Number of Visits 21    Date for PT Re-Evaluation 12/24/21    Authorization Type [place]    PT Start Time 1517    PT Stop Time 1555    PT Time Calculation (min) 38 min    Activity Tolerance Patient tolerated treatment well    Behavior During Therapy WFL for tasks assessed/performed                  Past Medical History:  Diagnosis Date   Allergy    Zyrtec.   Anxiety    followed by Dr. Toy Cookey   Back pain    Complication of anesthesia    Depression    Fibroid    Gastroparesis 09/14/2012   gastric emptying study in 2014   GERD (gastroesophageal reflux disease)    Headache    Pneumonia    2013ish   PONV (postoperative nausea and vomiting)     likes scopolamine patch and zofran /phenergan helps   Pre-diabetes    PTSD (post-traumatic stress disorder)    PTSD (post-traumatic stress disorder)    Sleep apnea 07/01/2018   cpap optional, pt close to not use cpap   Vitamin D deficiency    Wears glasses    Wears glasses    Past Surgical History:  Procedure Laterality Date   ABDOMINAL HYSTERECTOMY  01/2021   ANKLE ARTHROSCOPY Left 2011   CESAREAN SECTION  06/2006   x 1   CHOLECYSTECTOMY  07/2006   laparoscopic   CYSTOSCOPY N/A 02/04/2021   Procedure: CYSTOSCOPY;  Surgeon: Salvadore Dom, MD;  Location: [facility];  Service: Gynecology;  Laterality: N/A;   DILATION AND CURETTAGE OF UTERUS  1997   x 2   FOREIGN BODY REMOVAL Left 11/18/2016   Procedure: REMOVAL FOREIGN BODY EXTREMITY LEFT FOOT;  Surgeon: Trula Slade, DPM;  Location: [facility];  Service: Podiatry;  Laterality: Left;   KNEE ARTHROSCOPY WITH MEDIAL MENISECTOMY Right 09/10/2021   Procedure: KNEE ARTHROSCOPY WITH MEDIAL MENISCAL ROOT REPAIR;  Surgeon: Hiram Gash, MD;  Location: [facility];  Service: Orthopedics;  Laterality: Right;   PILONIDAL CYST EXCISION  1990's   RADIOLOGY WITH ANESTHESIA N/A 11/10/2018   Procedure: MRI WITH ANESTHESIA OF BRAIN AND ORBITS WITH AND WITHOUT CONTRAST;  Surgeon: Radiologist, Medication, MD;  Location: [facility];  Service: Radiology;  Laterality: N/A;   TENDON REPAIR Left 2011   Left Ankle   TOTAL LAPAROSCOPIC HYSTERECTOMY WITH SALPINGECTOMY Bilateral 02/04/2021   Procedure: TOTAL LAPAROSCOPIC HYSTERECTOMY WITH SALPINGECTOMY;  Surgeon: Salvadore Dom, MD;  Location: [facility];  Service: Gynecology;  Laterality: Bilateral;   WISDOM TOOTH EXTRACTION  1990's   Patient Active Problem List   Diagnosis Date Noted   Acute costochondritis 11/14/2021   Recurrent major depressive disorder, in partial remission ([place]) 09/30/2021   Status post laparoscopic hysterectomy 02/04/2021   S/P laparoscopic hysterectomy 02/04/2021   PTSD (post-traumatic stress disorder) 07/21/2018   GAD (generalized anxiety disorder) 07/21/2018   Insomnia 07/21/2018   MDD (major depressive disorder) 07/21/2018   Nightmares 07/21/2018   Insulin resistance 10/21/2017   Hypertriglyceridemia 10/07/2017   Vitamin D deficiency 10/07/2017   Residual foreign body in soft tissue 11/12/2016   Pre-diabetes 11/02/2016   Morbid obesity with body mass  index (BMI) of 50.0 to 59.9 in adult ([place]) 10/15/2016    PCP: [name], NP  REFERRING PROVIDER: Hiram Gash, MD  REFERRING DIAG: Right knee arthroscopy with medial meniscal root repair on 12/28  THERAPY DIAG:  Pain in joint of right knee  Stiffness of right knee, not elsewhere classified  Muscle weakness (generalized)  Difficulty walking  ONSET DATE: 09/10/2021  SUBJECTIVE:   SUBJECTIVE STATEMENT: Pt states she has few days of walking that caused the R knee to very sore. "It doesn't feel as stable." It just feels sore today.  PERTINENT HISTORY: No previous injury to R knee  PAIN:  Are you having  pain? 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 High-grade radial tear of the medial meniscal posterior horn adjacent to the root attachment site. 2. Small focal area of chondral fissuring along the medial trochlea inferiorly. 3. Small joint effusion  PATIENT SURVEYS:  FOTO 31 62 at D/C 19 pts MCII Objective   3 hyper ext 120 flexion  L hip ABD 51.7 lbs L knee flexion 46.1 lbs L knee ext  60.1   R hip ABD 57.4 lbs L knee flexion 47.8 lbs  L knee ext  53.3 lbs    TODAY'S TREATMENT:  3/7  Nustep L1 5 min  Self massage quad with roller- test retest- less pain with squatting after  STS with 2" block under R 2x10 Fwd lunging 10x Lateral lunging 10x Prone quad stretch 30s 2x SLS 30x 4x (unable to tolerate due to anterior knee soreness)  2/21  R knee flexion joint mob grade III-IV 4 rounds of 30s each STM R quad  LAQ 3lbs 3x10 Prone quad stretch 30s 3x Stair flexion stretch 10s 10x Standing HR 20x Gastroc stretch 30s 3x Heel toe rocking 2x10  HEP frequency and modifications  2/16  R knee flexion joint mob grade III-IV 4 rounds of 30s each  LAQ RTB 3x10 STS 2x10 from table height Stair step flexion stretch 10s 10x TKE RTB 3x10 Standing HR 20x Heel toe rocking 2x10  HEP frequency and  modifications  2/6 Quad set 3x10  SAQ 3x10  SLR 3x10  Heel slide with towel 3x10   PROM into flexion and extension; patellar mobilizations   Seated clamshell: Patient felt that the side lying hip abduction may have caused her some pain the last time  Ankle eversion/ inversion and PF green band x20   Standing:  Weight shift forward and back x20  Side to side with care not to go over mid line of the knee x20  Both in walker    2/2  Quad set 3x10  SLR 3x10  Heel slide with towel 3x10   PROM into flexion and extension; patellar mobilizations   Seated clamshell: Patient felt that the side lying hip abduction may have caused her some pain the last time  Standing:  Weight shift forward and back x20  Side to side with care not to go over mid line of the knee x20  Both in walker    PATIENT EDUCATION:  Education details: protocol, AD usage/ brace usage, anatomy, exercise progression, DOMS expectations, muscle firing,  envelope of function, HEP, POC  Person educated: Patient Education method: Explanation, Demonstration, Tactile cues, Verbal cues, and Handouts Education comprehension: verbalized understanding, returned demonstration, verbal cues required, and tactile cues required   HOME EXERCISE PROGRAM:  Access Code: [email or web address] URL: [email or web address] Date: 10/07/2021 Prepared by: Daleen Bo  Exercises Supine Quad Set - 2 x daily - 7 x weekly - 3 sets - 10 reps - 5 hold Active Straight Leg Raise with Quad Set - 2 x daily - 7 x weekly - 2 sets - 10 reps Prone Hip Extension - 2 x daily - 7 x weekly - 2 sets - 10 reps Modified Sidelying Hip Abduction - 2 x daily - 7 x weekly - 2 sets - 10 reps Seated Calf Stretch with Strap - 2 x daily - 7 x weekly - 1 sets - 3 reps - 30 hold    ASSESSMENT:  CLINICAL IMPRESSION: Pt demonstrates significant improvement in R knee ROM and strength is nearly symmetrical with L LE. However, pt had signficant strength and  endurance deficits in CKC. Pt with increased anterior pain with SL loading today that is consistent with quad strength deficits. Pt with difficulty during SL stability due to excessive quad hypertonicity. Pt HEP updated and pt advised on self recovery techniques. Pt able to ambulate at this point with [facility] though still requires it for longer distances. Plan to continue with strength and stability at future sessions.    Objective impairments include Abnormal gait, decreased activity tolerance, decreased balance, decreased endurance, decreased mobility, difficulty walking, decreased ROM, decreased strength, hypomobility, increased edema, increased fascial restrictions, increased muscle spasms, impaired flexibility, improper body mechanics, postural dysfunction, obesity, and pain. These impairments are limiting patient from cleaning, community activity, driving, meal prep, occupation, laundry, yard work, shopping, and exercise . Personal factors including Fitness, Profession, Time since onset of injury/illness/exacerbation, and 1-2 comorbidities:    are also affecting patient's functional outcome. Patient will benefit from skilled PT to address above impairments and improve overall function.  REHAB POTENTIAL: Good  CLINICAL DECISION MAKING: Stable/uncomplicated  EVALUATION COMPLEXITY: Low   GOALS:  SHORT TERM GOALS:  STG Name Target Date Goal status 2/6  1 Pt will become independent with HEP in order to demonstrate synthesis of PT education.  Baseline:  12/30/2021 Performing at home  Achieved   2 Pt will score at least 19 pt increase on FOTO to demonstrate functional improvement in MCII and pt perceived function.   Baseline:  12/30/2021 Not tested   3 Pt will be able to demonstrate ability to walk with FWB in order to demonstrate functional improvement in LE function for self-care and house hold duties.  Baseline: 12/30/2021 Progressing with her weight bearing    LONG TERM GOALS:   LTG Name  Target Date Goal status  1 Pt  will become independent with final HEP in order to demonstrate synthesis of PT education.   02/10/2022 ongoing  2 Pt will score >/= 62 on FOTO to demonstrate improvement in perceived R LE function.  02/10/2022 Not tested  3 Pt will be able to perform 5XSTS in under 12s  in order to demonstrate functional improvement above the cut off score for adults.    02/10/2022 Partially  met  4 Pt will be able to demonstrate/report ability to walk >15 mins without pain in order to demonstrate functional improvement and tolerance to exercise and community mobility.   02/10/2022 Partially met   PLAN: PT FREQUENCY: 1-2x/week  PT DURATION: 12 weeks  PLANNED INTERVENTIONS: Therapeutic exercises, Therapeutic activity, Neuro Muscular re-education, Balance training, Gait training, Patient/Family education, Joint mobilization, Stair training, Orthotic/Fit training, DME instructions, Aquatic Therapy, Dry Needling, Electrical stimulation, Spinal mobilization, Cryotherapy, Moist heat, Taping, Vasopneumatic device, Traction, Ultrasound, Ionotophoresis 35m/ml Dexamethasone, and Manual therapy  PLAN FOR NEXT SESSION(s): review HEP, small step up,sidestepping; SLS as tolerated, step up  ADaleen BoPT, DPT 11/18/21 4:00 PM

## 2021-11-19 ENCOUNTER — Other Ambulatory Visit (HOSPITAL_COMMUNITY): Payer: Self-pay

## 2021-11-20 DIAGNOSIS — F4310 Post-traumatic stress disorder, unspecified: Secondary | ICD-10-CM | POA: Diagnosis not present

## 2021-11-26 DIAGNOSIS — F4310 Post-traumatic stress disorder, unspecified: Secondary | ICD-10-CM | POA: Diagnosis not present

## 2021-11-30 ENCOUNTER — Other Ambulatory Visit: Payer: Self-pay | Admitting: Physician Assistant

## 2021-12-01 ENCOUNTER — Other Ambulatory Visit (HOSPITAL_COMMUNITY): Payer: Self-pay

## 2021-12-01 MED ORDER — FLUVOXAMINE MALEATE 50 MG PO TABS
50.0000 mg | ORAL_TABLET | Freq: Every day | ORAL | 1 refills | Status: DC
Start: 2021-12-01 — End: 2022-03-23
  Filled 2021-12-01: qty 90, 90d supply, fill #0
  Filled 2022-02-26: qty 90, 90d supply, fill #1

## 2021-12-02 DIAGNOSIS — F4310 Post-traumatic stress disorder, unspecified: Secondary | ICD-10-CM | POA: Diagnosis not present

## 2021-12-09 DIAGNOSIS — F4310 Post-traumatic stress disorder, unspecified: Secondary | ICD-10-CM | POA: Diagnosis not present

## 2021-12-12 DIAGNOSIS — S83241D Other tear of medial meniscus, current injury, right knee, subsequent encounter: Secondary | ICD-10-CM | POA: Diagnosis not present

## 2021-12-14 ENCOUNTER — Other Ambulatory Visit: Payer: Self-pay | Admitting: Physician Assistant

## 2021-12-15 ENCOUNTER — Other Ambulatory Visit (HOSPITAL_COMMUNITY): Payer: Self-pay

## 2021-12-16 NOTE — Telephone Encounter (Signed)
LVM to [facility]. On med list but not mentioned in notes. ? If she is still taking.  ?

## 2021-12-18 ENCOUNTER — Other Ambulatory Visit (HOSPITAL_COMMUNITY): Payer: Self-pay

## 2021-12-18 MED ORDER — PROPRANOLOL HCL 40 MG PO TABS
40.0000 mg | ORAL_TABLET | Freq: Two times a day (BID) | ORAL | 0 refills | Status: DC
  Filled 2021-12-18 – 2021-12-26 (×2): qty 180, 90d supply, fill #0

## 2021-12-18 NOTE — Telephone Encounter (Signed)
Patient says she is just taking medication at night, doesn't need it during the day now.  ?

## 2021-12-26 ENCOUNTER — Other Ambulatory Visit (HOSPITAL_COMMUNITY): Payer: Self-pay

## 2021-12-29 ENCOUNTER — Ambulatory Visit (HOSPITAL_BASED_OUTPATIENT_CLINIC_OR_DEPARTMENT_OTHER): Payer: 59 | Admitting: Nurse Practitioner

## 2021-12-29 ENCOUNTER — Other Ambulatory Visit (HOSPITAL_COMMUNITY): Payer: Self-pay

## 2021-12-29 ENCOUNTER — Encounter (HOSPITAL_BASED_OUTPATIENT_CLINIC_OR_DEPARTMENT_OTHER): Payer: Self-pay | Admitting: Nurse Practitioner

## 2021-12-29 DIAGNOSIS — E559 Vitamin D deficiency, unspecified: Secondary | ICD-10-CM | POA: Diagnosis not present

## 2021-12-29 DIAGNOSIS — R030 Elevated blood-pressure reading, without diagnosis of hypertension: Secondary | ICD-10-CM | POA: Insufficient documentation

## 2021-12-29 DIAGNOSIS — E781 Pure hyperglyceridemia: Secondary | ICD-10-CM

## 2021-12-29 DIAGNOSIS — Z6841 Body Mass Index (BMI) 40.0 and over, adult: Secondary | ICD-10-CM

## 2021-12-29 DIAGNOSIS — D2230 Melanocytic nevi of unspecified part of face: Secondary | ICD-10-CM | POA: Diagnosis not present

## 2021-12-29 DIAGNOSIS — R7303 Prediabetes: Secondary | ICD-10-CM

## 2021-12-29 HISTORY — DX: Elevated blood-pressure reading, without diagnosis of hypertension: R03.0

## 2021-12-29 MED ORDER — ONDANSETRON 8 MG PO TBDP
8.0000 mg | ORAL_TABLET | Freq: Three times a day (TID) | ORAL | 6 refills | Status: DC | PRN
  Filled 2021-12-29: qty 30, 10d supply, fill #0
  Filled 2022-02-22: qty 30, 10d supply, fill #1
  Filled 2022-04-16: qty 30, 10d supply, fill #2
  Filled 2022-05-15: qty 30, 10d supply, fill #3
  Filled 2022-06-15: qty 30, 10d supply, fill #4
  Filled 2022-07-15: qty 30, 10d supply, fill #5
  Filled 2022-08-13: qty 30, 10d supply, fill #6

## 2021-12-29 NOTE — Assessment & Plan Note (Signed)
Labs today.  ?Doing fantastic with semaglutide and CGM to control blood sugar levels.  ?Dietary and exercise habits appropriate.  ?No alarm sx present.  ?Will plan to continue current treatment.  ?F/U in 6 months or sooner if needed.  ?

## 2021-12-29 NOTE — Patient Instructions (Addendum)
It was a pleasure seeing you today. I hope your time spent with [name] was pleasant and helpful. Please let us know if there is anything we can do to improve the service you receive.  ? ?Today we discussed concerns with: ? ?Morbid obesity with body mass index (BMI) of 50.0 to 59.9 in adult [facility]) ?Pre-diabetes ?Hypertriglyceridemia ?Vitamin D deficiency ?Elevated blood pressure reading without diagnosis of hypertension ? ?We will get labs today to monitor your progress.  ?You are doing FANTASTIC with your weight loss efforts- Keep up the GREAT work! I am so proud of you.  ?Be sure you are limiting your saturated fat and carbohydrates and working to get at least 20-30 minutes of purposeful walking/exercise in every day. This will be key to continued weight loss and improved overall health.  ?Saturated fat: no more than 13g a day ?Carbohydrates: no more than 150g a day ?Fiber: work to build up to 20-30g a day ? ?The following orders have been placed for you today: ? ?No orders of the defined types were placed in this encounter. ? ? ? ?Important Office Information ?Lab Results ?If labs were ordered, please note that you will see results through [facility] as soon as they come available from [place] [facility].  ?It takes up to 5 business days for the results to be routed to me and for me to review them once all of the lab results have come through from [facility]. I will make recommendations based on your results and send these through [place] or someone from the office will call you to discuss. If your labs are abnormal, we may contact you to schedule a visit to discuss the results and make recommendations.  ?If you have not heard from [name] within 5 business days or you have waited longer than a week and your lab results have not come through on [place], please feel free to call the office or send a message through [place] to follow-up on these labs.  ? ?Referrals ?If referrals were placed today, the office where the referral was sent will  contact you either by phone or through [place] to set up scheduling. Please note that it can take up to a week for the referral office to contact you. If you do not hear from them in a week, please contact the referral office directly to inquire about scheduling.  ? ?Condition Treated ?If your condition worsens or you begin to have new symptoms, please schedule a follow-up appointment for further evaluation. If you are not sure if an appointment is needed, you may call the office to leave a message for the nurse and someone will contact you with recommendations.  ?If you have an urgent or life threatening emergency, please do not call the office, but seek emergency evaluation by calling 911 or going to the nearest emergency room for evaluation.  ? ?MyChart and Phone Calls ?Please do not use MyChart for urgent messages. It may take up to 3 business days for MyChart messages to be read by staff and if they are unable to handle the request, an additional 3 business days for them to be routed to me and for my response.  ?Messages sent to the provider through [place] do not come directly to the provider, please allow time for these messages to be routed and for me to respond.  ?We get a large volume of MyChart messages daily and these are responded to in the order received.  ? ?For urgent messages, please call the office  at [phone] and speak with the front office staff or leave a message on the line of my assistant for guidance.  ?We are seeing patients from the hours of 8:00 am through 5:00 pm and calls directly to the nurse may not be answered immediately due to seeing patients, but your call will be returned as soon as possible.  ?Phone  messages received after 4:00 PM Monday through Thursday may not be returned until the following business day. Phone messages received after 11:00 AM on Friday may not be returned until Monday.  ? ?After Hours ?We share on call hours with providers from other offices. If you have  an urgent need after hours that cannot wait until the next business day, please contact the on call provider by calling the office number. A nurse will speak with you and contact the provider if needed for recommendations.  ?If you have an urgent or life threatening emergency after hours, please do not call the on call provider, but seek emergency evaluation by calling 911 or going to the nearest emergency room for evaluation.  ? ?Paperwork ?All paperwork requires a minimum of 5 days to complete and return to you or the designated personnel. Please keep this in mind when bringing in forms or sending requests for paperwork completion to the office.  ?  ?

## 2021-12-29 NOTE — Assessment & Plan Note (Signed)
Nevus on left cheek under eyelid. Patient reports it is changing slowly in size and coloration. Second nevus on the right cheek. Growing in size. No alarm symptoms present at this time. Will send referral to dermatology for skin check evaluation.  ?

## 2021-12-29 NOTE — Assessment & Plan Note (Signed)
Blood pressure stable in office today. No concerns or alarm sx present.  ?

## 2021-12-29 NOTE — Progress Notes (Signed)
?[name], DNP, AGNP-c ?[facility] ?[address] ?[address] ?[place], [place] [address] ?[phone] Office [phone] Fax ? ?ESTABLISHED PATIENT- Chronic Health and/or Follow-Up Visit ? ?Blood pressure 133/72, pulse 86, height '5\' 5"'$  (1.651 m), weight 275 lb 3.2 oz (124.8 kg), last menstrual period 01/06/2021, SpO2 99 %. ? ?Weight Check ? ? ?HPI ? ?Caroline Gonzales  is a 46 y.o. year old female presenting today for evaluation and management of the following: ?Blood Pressure ?Caroline Gonzales has had transient elevated blood pressure readings since the end of December.  ?Her most recent blood pressure in the office was controlled, but it had been quite elevated on a visit to the ED prior to that- suspect this was due to pain ?She reports: no symptoms ?She denies:ha, cp, palpitations, shob, edema ?Weight Management ?In January semaglutide was added to metformin for weight, blood glucose, and hyperlipidemia management.  ?Her last A1c was 6.2% ?Her last weight was 303lbs with a BMI of 50.57% ?Today she tells me is doing great and down 27lbs so far ?Having some issues with constipation, but managing this with over the counter medications ?She does occasionally have drops in her BG in the night and the glucometer is helping to manage this.  ? ?ROS ?All ROS negative with exception of what is listed in HPI ? ?PHYSICAL EXAM ?Physical Exam ?Vitals and nursing note reviewed.  ?Constitutional:   ?   Appearance: Normal appearance.  ?HENT:  ?   Head: Normocephalic.  ?Eyes:  ?   Extraocular Movements: Extraocular movements intact.  ?   Conjunctiva/sclera: Conjunctivae normal.  ?   Pupils: Pupils are equal, round, and reactive to light.  ?Cardiovascular:  ?   Rate and Rhythm: Normal rate and regular rhythm.  ?   Pulses: Normal pulses.  ?   Heart sounds: Normal heart sounds.  ?Pulmonary:  ?   Effort: Pulmonary effort is normal.  ?   Breath sounds: Normal breath sounds.  ?Abdominal:  ?    General: Bowel sounds are normal.  ?   Palpations: Abdomen is soft.  ?Musculoskeletal:  ?   Cervical back: Normal range of motion.  ?   Right lower leg: No edema.  ?   Left lower leg: No edema.  ?Lymphadenopathy:  ?   Cervical: No cervical adenopathy.  ?Skin: ?   General: Skin is warm and dry.  ?   Capillary Refill: Capillary refill takes less than 2 seconds.  ?Neurological:  ?   General: No focal deficit present.  ?   Mental Status: She is alert and oriented to person, place, and time.  ?Psychiatric:     ?   Mood and Affect: Mood normal.     ?   Behavior: Behavior normal.     ?   Thought Content: Thought content normal.     ?   Judgment: Judgment normal.  ? ? ?ASSESSMENT & PLAN ?Caroline Gonzales was seen today for weight check. ? ?Diagnoses and all orders for this visit: ? ?Morbid obesity with body mass index (BMI) of 50.0 to 59.9 in adult [facility]) ?-     Lipid panel ?-     Hemoglobin A1c ?-     VITAMIN D 25 Hydroxy (Vit-D Deficiency, Fractures) ?-     ondansetron (ZOFRAN-ODT) 8 MG disintegrating tablet; Dissolve 1 tablet by mouth every 8 hours as needed for nausea. ? ?Pre-diabetes ?-     Lipid panel ?-     Hemoglobin A1c ?-  VITAMIN D 25 Hydroxy (Vit-D Deficiency, Fractures) ?-     ondansetron (ZOFRAN-ODT) 8 MG disintegrating tablet; Dissolve 1 tablet by mouth every 8 hours as needed for nausea. ? ?Hypertriglyceridemia ?-     Lipid panel ?-     Hemoglobin A1c ?-     VITAMIN D 25 Hydroxy (Vit-D Deficiency, Fractures) ? ?Vitamin D deficiency ?-     Lipid panel ?-     Hemoglobin A1c ?-     VITAMIN D 25 Hydroxy (Vit-D Deficiency, Fractures) ? ?Elevated blood pressure reading without diagnosis of hypertension ?-     Lipid panel ?-     Hemoglobin A1c ?-     VITAMIN D 25 Hydroxy (Vit-D Deficiency, Fractures) ? ?Change in facial mole ?-     Ambulatory referral to Dermatology ? ? ? ?FOLLOW-UP ?Return in about 6 months (around 06/30/2022) for Weight and labs. ? ?Time: 32 minutes, >50% spent counseling, care coordination, chart  review, and documentation.  ? ?[name], DNP, AGNP-c ?

## 2021-12-29 NOTE — Assessment & Plan Note (Signed)
Weight loss of 27 lbs with increased activity and strict dietary monitoring. Tolerating semaglutide very well and CGM is helpful to educate on foods that are better for her blood sugar. A few night time levels dropping below 70- discussed importance of eating protein and carbohydrate when this occurs to prevent rebound hypoglycemia.  ?Will continue medication at this time. Labs today.  ?BMI down to 45.8%.  ?She is doing fantastic! Patient praised for her efforts and progress.  ?

## 2021-12-30 DIAGNOSIS — F4310 Post-traumatic stress disorder, unspecified: Secondary | ICD-10-CM | POA: Diagnosis not present

## 2021-12-31 LAB — HEMOGLOBIN A1C
Est. average glucose Bld gHb Est-mCnc: 111 mg/dL
Hgb A1c MFr Bld: 5.5 % (ref 4.8–5.6)

## 2021-12-31 LAB — LIPID PANEL
Chol/HDL Ratio: 4.2 ratio (ref 0.0–4.4)
Cholesterol, Total: 146 mg/dL (ref 100–199)
HDL: 35 mg/dL — ABNORMAL LOW (ref >39)
LDL Chol Calc (NIH): 66 mg/dL (ref 0–99)
Triglycerides: 279 mg/dL — ABNORMAL HIGH (ref 0–149)
VLDL Cholesterol Cal: 45 mg/dL — ABNORMAL HIGH (ref 5–40)

## 2021-12-31 LAB — VITAMIN D 25 HYDROXY (VIT D DEFICIENCY, FRACTURES): Vit D, 25-Hydroxy: 92 ng/mL (ref 30.0–100.0)

## 2022-01-01 ENCOUNTER — Telehealth: Payer: Self-pay | Admitting: Dermatology

## 2022-01-01 NOTE — Telephone Encounter (Signed)
Please ask referrer, Caroline Gonzales), to see if they can get her in elsewhere before December ?

## 2022-01-01 NOTE — Telephone Encounter (Signed)
Notes documented and referral routed back to referring office. 

## 2022-01-12 DIAGNOSIS — Z1283 Encounter for screening for malignant neoplasm of skin: Secondary | ICD-10-CM | POA: Diagnosis not present

## 2022-01-12 DIAGNOSIS — D2239 Melanocytic nevi of other parts of face: Secondary | ICD-10-CM | POA: Diagnosis not present

## 2022-01-12 DIAGNOSIS — D225 Melanocytic nevi of trunk: Secondary | ICD-10-CM | POA: Diagnosis not present

## 2022-01-12 DIAGNOSIS — D485 Neoplasm of uncertain behavior of skin: Secondary | ICD-10-CM | POA: Diagnosis not present

## 2022-01-12 DIAGNOSIS — D2262 Melanocytic nevi of left upper limb, including shoulder: Secondary | ICD-10-CM | POA: Diagnosis not present

## 2022-01-13 DIAGNOSIS — F4310 Post-traumatic stress disorder, unspecified: Secondary | ICD-10-CM | POA: Diagnosis not present

## 2022-01-18 ENCOUNTER — Other Ambulatory Visit: Payer: Self-pay | Admitting: Physician Assistant

## 2022-01-19 ENCOUNTER — Other Ambulatory Visit (HOSPITAL_COMMUNITY): Payer: Self-pay

## 2022-01-19 MED ORDER — GABAPENTIN 300 MG PO CAPS
600.0000 mg | ORAL_CAPSULE | Freq: Every day | ORAL | 0 refills | Status: DC
  Filled 2022-01-19: qty 270, 90d supply, fill #0

## 2022-01-19 MED ORDER — QUETIAPINE FUMARATE 200 MG PO TABS
100.0000 mg | ORAL_TABLET | Freq: Every day | ORAL | 1 refills | Status: DC
  Filled 2022-01-19: qty 90, 90d supply, fill #0

## 2022-01-21 ENCOUNTER — Other Ambulatory Visit (HOSPITAL_COMMUNITY): Payer: Self-pay

## 2022-01-22 DIAGNOSIS — F4310 Post-traumatic stress disorder, unspecified: Secondary | ICD-10-CM | POA: Diagnosis not present

## 2022-01-27 DIAGNOSIS — F4310 Post-traumatic stress disorder, unspecified: Secondary | ICD-10-CM | POA: Diagnosis not present

## 2022-01-29 ENCOUNTER — Other Ambulatory Visit (HOSPITAL_COMMUNITY): Payer: Self-pay

## 2022-01-29 ENCOUNTER — Other Ambulatory Visit: Payer: Self-pay | Admitting: Physician Assistant

## 2022-01-29 MED ORDER — PRAZOSIN HCL 2 MG PO CAPS
8.0000 mg | ORAL_CAPSULE | Freq: Every day | ORAL | 1 refills | Status: DC
  Filled 2022-01-29: qty 103, 25d supply, fill #0
  Filled 2022-01-30: qty 257, 65d supply, fill #0
  Filled 2022-04-27: qty 153, 38d supply, fill #1
  Filled 2022-04-28: qty 207, 52d supply, fill #1

## 2022-01-30 ENCOUNTER — Other Ambulatory Visit (HOSPITAL_COMMUNITY): Payer: Self-pay

## 2022-02-03 DIAGNOSIS — F4310 Post-traumatic stress disorder, unspecified: Secondary | ICD-10-CM | POA: Diagnosis not present

## 2022-02-06 ENCOUNTER — Other Ambulatory Visit: Payer: Self-pay | Admitting: Obstetrics and Gynecology

## 2022-02-06 DIAGNOSIS — Z1231 Encounter for screening mammogram for malignant neoplasm of breast: Secondary | ICD-10-CM

## 2022-02-10 ENCOUNTER — Encounter (HOSPITAL_BASED_OUTPATIENT_CLINIC_OR_DEPARTMENT_OTHER): Payer: Self-pay | Admitting: Nurse Practitioner

## 2022-02-10 DIAGNOSIS — F4310 Post-traumatic stress disorder, unspecified: Secondary | ICD-10-CM | POA: Diagnosis not present

## 2022-02-12 ENCOUNTER — Ambulatory Visit
Admission: RE | Admit: 2022-02-12 | Discharge: 2022-02-12 | Disposition: A | Discharge status: Home / Self Care | Payer: 59 | Source: Ambulatory Visit | Attending: Obstetrics and Gynecology | Admitting: Obstetrics and Gynecology

## 2022-02-12 DIAGNOSIS — Z1231 Encounter for screening mammogram for malignant neoplasm of breast: Secondary | ICD-10-CM | POA: Diagnosis not present

## 2022-02-13 ENCOUNTER — Other Ambulatory Visit (HOSPITAL_COMMUNITY): Payer: Self-pay

## 2022-02-16 ENCOUNTER — Other Ambulatory Visit (HOSPITAL_COMMUNITY): Payer: Self-pay

## 2022-02-17 DIAGNOSIS — F4310 Post-traumatic stress disorder, unspecified: Secondary | ICD-10-CM | POA: Diagnosis not present

## 2022-02-23 ENCOUNTER — Other Ambulatory Visit (HOSPITAL_COMMUNITY): Payer: Self-pay

## 2022-02-26 ENCOUNTER — Other Ambulatory Visit (HOSPITAL_COMMUNITY): Payer: Self-pay

## 2022-02-27 ENCOUNTER — Other Ambulatory Visit (HOSPITAL_COMMUNITY): Payer: Self-pay

## 2022-02-28 ENCOUNTER — Encounter (HOSPITAL_BASED_OUTPATIENT_CLINIC_OR_DEPARTMENT_OTHER): Payer: Self-pay | Admitting: Nurse Practitioner

## 2022-02-28 ENCOUNTER — Other Ambulatory Visit (HOSPITAL_COMMUNITY): Payer: Self-pay

## 2022-02-28 ENCOUNTER — Other Ambulatory Visit (HOSPITAL_BASED_OUTPATIENT_CLINIC_OR_DEPARTMENT_OTHER): Payer: Self-pay | Admitting: Nurse Practitioner

## 2022-02-28 DIAGNOSIS — Z6841 Body Mass Index (BMI) 40.0 and over, adult: Secondary | ICD-10-CM

## 2022-02-28 DIAGNOSIS — R7303 Prediabetes: Secondary | ICD-10-CM

## 2022-02-28 DIAGNOSIS — E781 Pure hyperglyceridemia: Secondary | ICD-10-CM

## 2022-02-28 DIAGNOSIS — E7849 Other hyperlipidemia: Secondary | ICD-10-CM

## 2022-02-28 MED ORDER — SEMAGLUTIDE (2 MG/DOSE) 8 MG/3ML ~~LOC~~ SOPN
2.0000 mg | PEN_INJECTOR | SUBCUTANEOUS | 6 refills | Status: DC
  Filled 2022-02-28: qty 3, 28d supply, fill #0

## 2022-02-28 MED ORDER — SEMAGLUTIDE (2 MG/DOSE) 8 MG/3ML ~~LOC~~ SOPN
2.0000 mg | PEN_INJECTOR | SUBCUTANEOUS | 6 refills | Status: DC
  Filled 2022-02-28: qty 3, fill #0

## 2022-03-02 ENCOUNTER — Other Ambulatory Visit (HOSPITAL_COMMUNITY): Payer: Self-pay

## 2022-03-03 DIAGNOSIS — F4310 Post-traumatic stress disorder, unspecified: Secondary | ICD-10-CM | POA: Diagnosis not present

## 2022-03-10 DIAGNOSIS — F4310 Post-traumatic stress disorder, unspecified: Secondary | ICD-10-CM | POA: Diagnosis not present

## 2022-03-14 ENCOUNTER — Encounter (HOSPITAL_BASED_OUTPATIENT_CLINIC_OR_DEPARTMENT_OTHER): Payer: Self-pay

## 2022-03-14 ENCOUNTER — Telehealth: Payer: 59 | Admitting: Nurse Practitioner

## 2022-03-14 ENCOUNTER — Emergency Department (HOSPITAL_BASED_OUTPATIENT_CLINIC_OR_DEPARTMENT_OTHER)
Admission: EM | Admit: 2022-03-14 | Discharge: 2022-03-14 | Disposition: A | Discharge status: Home / Self Care | Payer: 59 | Attending: Emergency Medicine | Admitting: Emergency Medicine

## 2022-03-14 ENCOUNTER — Other Ambulatory Visit: Payer: Self-pay

## 2022-03-14 DIAGNOSIS — Z91040 Latex allergy status: Secondary | ICD-10-CM | POA: Insufficient documentation

## 2022-03-14 DIAGNOSIS — Z209 Contact with and (suspected) exposure to unspecified communicable disease: Secondary | ICD-10-CM

## 2022-03-14 DIAGNOSIS — Z203 Contact with and (suspected) exposure to rabies: Secondary | ICD-10-CM | POA: Insufficient documentation

## 2022-03-14 DIAGNOSIS — Z794 Long term (current) use of insulin: Secondary | ICD-10-CM | POA: Insufficient documentation

## 2022-03-14 DIAGNOSIS — Z23 Encounter for immunization: Secondary | ICD-10-CM | POA: Insufficient documentation

## 2022-03-14 MED ORDER — RABIES VACCINE, PCEC IM SUSR
1.0000 mL | Freq: Once | INTRAMUSCULAR | Status: AC
  Administered 2022-03-14: 1 mL via INTRAMUSCULAR
  Filled 2022-03-14: qty 1

## 2022-03-14 NOTE — Progress Notes (Signed)
Virtual Visit Consent   Caroline Gonzales, you are scheduled for a virtual visit with a [facility] provider today. Just as with appointments in the office, your consent must be obtained to participate. Your consent will be active for this visit and any virtual visit you may have with one of our providers in the next 365 days. If you have a MyChart account, a copy of this consent can be sent to you electronically.  As this is a virtual visit, video technology does not allow for your provider to perform a traditional examination. This may limit your provider's ability to fully assess your condition. If your provider identifies any concerns that need to be evaluated in person or the need to arrange testing (such as labs, EKG, etc.), we will make arrangements to do so. Although advances in technology are sophisticated, we cannot ensure that it will always work on either your end or our end. If the connection with a video visit is poor, the visit may have to be switched to a telephone visit. With either a video or telephone visit, we are not always able to ensure that we have a secure connection.  By engaging in this virtual visit, you consent to the provision of healthcare and authorize for your insurance to be billed (if applicable) for the services provided during this visit. Depending on your insurance coverage, you may receive a charge related to this service.  I need to obtain your verbal consent now. Are you willing to proceed with your visit today? Eileen Kangas Honeyman has provided verbal consent on 03/14/2022 for a virtual visit (video or telephone). Apolonio Schneiders, FNP  Date: 03/14/2022 12:31 PM  Virtual Visit via Video Note   I, Apolonio Schneiders, connected with  Caroline Gonzales  ([id number], 1975-10-17) on 03/14/22 at 12:45 PM EDT by a video-enabled telemedicine application and verified that I am speaking with the correct person using two identifiers.  Location: Patient: Virtual Visit Location Patient:  Home Provider: Virtual Visit Location Provider: Home Office   I discussed the limitations of evaluation and management by telemedicine and the availability of in person appointments. The patient expressed understanding and agreed to proceed.    History of Present Illness: Caroline Gonzales is a 46 y.o. who identifies as a female who was assigned female at birth, and is being seen today after exposure to a bat.   The bat was found in her home and was there for an unknown amount of time.  The patient took the bat outside with gloves on.   No known bite/scratch    Problems:  Patient Active Problem List   Diagnosis Date Noted   Elevated blood pressure reading without diagnosis of hypertension 12/29/2021   Change in facial mole 12/29/2021   Acute costochondritis 11/14/2021   Recurrent major depressive disorder, in partial remission ([place]) 09/30/2021   Status post laparoscopic hysterectomy 02/04/2021   S/P laparoscopic hysterectomy 02/04/2021   PTSD (post-traumatic stress disorder) 07/21/2018   GAD (generalized anxiety disorder) 07/21/2018   Insomnia 07/21/2018   MDD (major depressive disorder) 07/21/2018   Nightmares 07/21/2018   Insulin resistance 10/21/2017   Hypertriglyceridemia 10/07/2017   Vitamin D deficiency 10/07/2017   Residual foreign body in soft tissue 11/12/2016   Pre-diabetes 11/02/2016   Morbid obesity with body mass index (BMI) of 50.0 to 59.9 in adult ([place]) 10/15/2016    Allergies:  Allergies  Allergen Reactions   Penicillins Shortness Of Breath and Rash    Did it involve  swelling of the face/tongue/throat, SOB, or low BP? Yes Did it involve sudden or severe rash/hives, skin peeling, or any reaction on the inside of your mouth or nose? Yes Did you need to seek medical attention at a hospital or doctor's office? Yes When did it last happen?      childhood allergy If all above answers are "NO", may proceed with cephalosporin use.     Tamiflu [Oseltamivir  Phosphate] Anaphylaxis   Cephalosporins Rash   Latex Rash   Medications:  Current Outpatient Medications:    Carboxymethylcellul-Glycerin (LUBRICATING EYE DROPS OP), Place 1 drop into both eyes daily as needed (dryness/ allergies)., Disp: , Rfl:    cetirizine (ZYRTEC) 10 MG tablet, Take 10 mg by mouth at bedtime., Disp: , Rfl:    Cholecalciferol (VITAMIN D3) 5000 units CAPS, Take 5,000 Units by mouth at bedtime., Disp: , Rfl:    clonazePAM (KLONOPIN) 0.5 MG tablet, Take 1 tablet (0.5 mg total) by mouth every 6 (six) hours as needed for anxiety., Disp: 120 tablet, Rfl: 5   Continuous Blood Gluc Sensor (FREESTYLE LIBRE 3 SENSOR) MISC, Place 1 sensor on the skin every 14 days. Use to check glucose continuously, Disp: 2 each, Rfl: 11   esomeprazole (NEXIUM) 20 MG capsule, Take 20 mg by mouth at bedtime., Disp: , Rfl:    fluvoxaMINE (LUVOX) 100 MG tablet, Take 1 tablet by mouth at bedtime. Take with 50 mg dose to equal 150 mg at bedtime, Disp: 90 tablet, Rfl: 1   fluvoxaMINE (LUVOX) 50 MG tablet, TAKE 1 TABLET BY MOUTH AT BEDTIME. TAKE IN ADDITION TO THE 100 MG TABLET TO EQUAL 150 MG TOTAL, Disp: 90 tablet, Rfl: 1   gabapentin (NEURONTIN) 300 MG capsule, Take 2-3 capsules (600-900 mg total) by mouth at bedtime., Disp: 270 capsule, Rfl: 0   Multiple Vitamins-Minerals (HAIR SKIN AND NAILS FORMULA PO), , Disp: , Rfl:    ondansetron (ZOFRAN-ODT) 8 MG disintegrating tablet, Dissolve 1 tablet by mouth every 8 hours as needed for nausea., Disp: 30 tablet, Rfl: 6   prazosin (MINIPRESS) 2 MG capsule, Take 4 capsules by mouth at bedtime., Disp: 360 capsule, Rfl: 1   propranolol (INDERAL) 40 MG tablet, Take 1 tablet (40 mg total) by mouth 2 (two) times daily., Disp: 180 tablet, Rfl: 0   QUEtiapine (SEROQUEL) 200 MG tablet, Take 1/2-1 tablet (100-200 mg total) by mouth at bedtime., Disp: 90 tablet, Rfl: 1   QUEtiapine (SEROQUEL) 300 MG tablet, Take 1 tablet by mouth at bedtime., Disp: 90 tablet, Rfl: 1    Semaglutide, 2 MG/DOSE, 8 MG/3ML SOPN, Inject 2 mg as directed once a week., Disp: 3 mL, Rfl: 6   triamcinolone (NASACORT) 55 MCG/ACT AERO nasal inhaler, Place 1 spray into the nose at bedtime., Disp: , Rfl:   Observations/Objective: Patient is well-developed, well-nourished in no acute distress.  Resting comfortably  at home.  Head is normocephalic, atraumatic.  No labored breathing.  Speech is clear and coherent with logical content.  Patient is alert and oriented at baseline.    Assessment and Plan: 1. Exposure to bat without known bite Per CDC recommendation for exposure to a bat in the home Rabies vaccination is advised.  Since it is a holiday weekend and Health Departments are closed currently she will seek vaccination at the ED  Patient is understanding and agreeable to plan      Follow Up Instructions: I discussed the assessment and treatment plan with the patient. The patient was provided an opportunity  to ask questions and all were answered. The patient agreed with the plan and demonstrated an understanding of the instructions.  A copy of instructions were sent to the patient via MyChart unless otherwise noted below.   The patient was advised to call back or seek an in-person evaluation if the symptoms worsen or if the condition fails to improve as anticipated.  Time:  I spent 7 minutes with the patient via telehealth technology discussing the above problems/concerns.    Apolonio Schneiders, FNP

## 2022-03-14 NOTE — ED Notes (Signed)
Reviewed AVS/discharge instruction with patient. Time allotted for and all questions answered. Patient is agreeable for d/c and escorted to ed exit by staff.  

## 2022-03-14 NOTE — ED Provider Notes (Signed)
[facility] EMERGENCY DEPT Provider Note   CSN: [id number] Arrival date & time: 03/14/22  1311     History  Chief Complaint  Patient presents with   Animal Exposure    ALSACE DOWD is a 46 y.o. female.  46 year old female who presents after possible rabies exposure.  Patient states that she found a bat in her kitchen this morning.  Denies being directly exposed to it.  Currently does not have any symptoms       Home Medications Prior to Admission medications   Medication Sig Start Date End Date Taking? Authorizing Provider  Carboxymethylcellul-Glycerin (LUBRICATING EYE DROPS OP) Place 1 drop into both eyes daily as needed (dryness/ allergies).    [provider]  cetirizine (ZYRTEC) 10 MG tablet Take 10 mg by mouth at bedtime.    [provider]  Cholecalciferol (VITAMIN D3) 5000 units CAPS Take 5,000 Units by mouth at bedtime.    [provider]  clonazePAM (KLONOPIN) 0.5 MG tablet Take 1 tablet (0.5 mg total) by mouth every 6 (six) hours as needed for anxiety. 11/06/21   Donnal Moat T, PA-C  Continuous Blood Gluc Sensor (FREESTYLE LIBRE 3 SENSOR) MISC Place 1 sensor on the skin every 14 days. Use to check glucose continuously 09/30/21   Early, Coralee Pesa, NP  esomeprazole (NEXIUM) 20 MG capsule Take 20 mg by mouth at bedtime.    [provider]  fluvoxaMINE (LUVOX) 100 MG tablet Take 1 tablet by mouth at bedtime. Take with 50 mg dose to equal 150 mg at bedtime 09/01/21   Donnal Moat T, PA-C  fluvoxaMINE (LUVOX) 50 MG tablet TAKE 1 TABLET BY MOUTH AT BEDTIME. TAKE IN ADDITION TO THE 100 MG TABLET TO EQUAL 150 MG TOTAL 12/01/21   Donnal Moat T, PA-C  gabapentin (NEURONTIN) 300 MG capsule Take 2-3 capsules (600-900 mg total) by mouth at bedtime. 01/19/22   Addison Lank, PA-C  Multiple Vitamins-Minerals (HAIR SKIN AND NAILS FORMULA PO)  04/14/21   [provider]  ondansetron (ZOFRAN-ODT) 8 MG disintegrating tablet  Dissolve 1 tablet by mouth every 8 hours as needed for nausea. 12/29/21   [name], NP  prazosin (MINIPRESS) 2 MG capsule Take 4 capsules by mouth at bedtime. 01/29/22   Addison Lank, PA-C  propranolol (INDERAL) 40 MG tablet Take 1 tablet (40 mg total) by mouth 2 (two) times daily. 12/18/21   Donnal Moat T, PA-C  QUEtiapine (SEROQUEL) 200 MG tablet Take 1/2-1 tablet (100-200 mg total) by mouth at bedtime. 01/19/22   Donnal Moat T, PA-C  QUEtiapine (SEROQUEL) 300 MG tablet Take 1 tablet by mouth at bedtime. 11/18/21   Addison Lank, PA-C  Semaglutide, 2 MG/DOSE, 8 MG/3ML SOPN Inject 2 mg as directed once a week. 02/28/22   [name], NP  triamcinolone (NASACORT) 55 MCG/ACT AERO nasal inhaler Place 1 spray into the nose at bedtime.    [provider]      Allergies    Penicillins, Tamiflu [oseltamivir phosphate], Cephalosporins, and Latex    Review of Systems   Review of Systems  All other systems reviewed and are negative.   Physical Exam Updated Vital Signs BP 139/80 (BP Location: Right Arm)   Pulse 89   Temp 98.4 F (36.9 C)   Resp 14   Ht 1.651 m ('5\' 5"'$ )   Wt 124.8 kg   LMP 01/06/2021   SpO2 97%   BMI 45.78 kg/m  Physical Exam Vitals and nursing  note reviewed.  Constitutional:      General: She is not in acute distress.    Appearance: Normal appearance. She is well-developed. She is not toxic-appearing.  HENT:     Head: Normocephalic and atraumatic.  Eyes:     General: Lids are normal.     Conjunctiva/sclera: Conjunctivae normal.     Pupils: Pupils are equal, round, and reactive to light.  Neck:     Thyroid: No thyroid mass.     Trachea: No tracheal deviation.  Cardiovascular:     Rate and Rhythm: Normal rate and regular rhythm.     Heart sounds: Normal heart sounds. No murmur heard.    No gallop.  Pulmonary:     Effort: Pulmonary effort is normal. No respiratory distress.     Breath sounds: Normal breath sounds. No stridor. No decreased breath  sounds, wheezing, rhonchi or rales.  Abdominal:     General: There is no distension.     Palpations: Abdomen is soft.     Tenderness: There is no abdominal tenderness. There is no rebound.  Musculoskeletal:        General: No tenderness. Normal range of motion.     Cervical back: Normal range of motion and neck supple.  Skin:    General: Skin is warm and dry.     Findings: No abrasion or rash.  Neurological:     Mental Status: She is alert and oriented to person, place, and time. Mental status is at baseline.     GCS: GCS eye subscore is 4. GCS verbal subscore is 5. GCS motor subscore is 6.     Cranial Nerves: No cranial nerve deficit.     Sensory: No sensory deficit.     Motor: Motor function is intact.  Psychiatric:        Attention and Perception: Attention normal.        Speech: Speech normal.        Behavior: Behavior normal.     ED Results / Procedures / Treatments   Labs (all labs ordered are listed, but only abnormal results are displayed) Labs Reviewed - No data to display  EKG None  Radiology No results found.  Procedures Procedures    Medications Ordered in ED Medications  rabies vaccine (RABAVERT) injection 1 mL (has no administration in time range)    ED Course/ Medical Decision Making/ A&P                           Medical Decision Making Risk Prescription drug management.   Patient to be started on rabies vaccine series given first dose here.  Return precautions to be given        Final Clinical Impression(s) / ED Diagnoses Final diagnoses:  None    Rx / DC Orders ED Discharge Orders     None         Lacretia Leigh, MD 03/14/22 1546

## 2022-03-14 NOTE — ED Triage Notes (Signed)
Patient here POV from Home.  Endorses being at Home when she noted a Bat in her House at 1200 today. Patient placed on Examination Gloves and removed Bat from Area.   No Known Trauma or Open Exposure.   NAD noted during Triage. A&Ox4. Gcs 15. Ambulatory.

## 2022-03-17 ENCOUNTER — Ambulatory Visit (HOSPITAL_COMMUNITY)
Admission: EM | Admit: 2022-03-17 | Discharge: 2022-03-17 | Disposition: A | Discharge status: Home / Self Care | Payer: 59 | Attending: Internal Medicine | Admitting: Internal Medicine

## 2022-03-17 ENCOUNTER — Encounter (HOSPITAL_COMMUNITY): Payer: Self-pay

## 2022-03-17 DIAGNOSIS — Z203 Contact with and (suspected) exposure to rabies: Secondary | ICD-10-CM | POA: Diagnosis not present

## 2022-03-17 MED ORDER — RABIES VACCINE, PCEC IM SUSR
1.0000 mL | Freq: Once | INTRAMUSCULAR | Status: AC
Start: 2022-03-17 — End: 2022-03-17
  Administered 2022-03-17: 1 mL via INTRAMUSCULAR

## 2022-03-17 MED ORDER — RABIES VACCINE, PCEC IM SUSR
INTRAMUSCULAR | Status: AC
  Filled 2022-03-17: qty 1

## 2022-03-17 NOTE — ED Triage Notes (Signed)
Pt here for 3 day rabies vaccine.

## 2022-03-18 DIAGNOSIS — F4310 Post-traumatic stress disorder, unspecified: Secondary | ICD-10-CM | POA: Diagnosis not present

## 2022-03-21 ENCOUNTER — Ambulatory Visit (HOSPITAL_COMMUNITY)
Admission: EM | Admit: 2022-03-21 | Discharge: 2022-03-21 | Disposition: A | Discharge status: Home / Self Care | Payer: 59 | Attending: Internal Medicine | Admitting: Internal Medicine

## 2022-03-21 DIAGNOSIS — Z203 Contact with and (suspected) exposure to rabies: Secondary | ICD-10-CM

## 2022-03-21 MED ORDER — RABIES VACCINE, PCEC IM SUSR
INTRAMUSCULAR | Status: AC
  Filled 2022-03-21: qty 1

## 2022-03-21 MED ORDER — RABIES VACCINE, PCEC IM SUSR
1.0000 mL | Freq: Once | INTRAMUSCULAR | Status: AC
  Administered 2022-03-21: 1 mL via INTRAMUSCULAR

## 2022-03-21 NOTE — ED Notes (Signed)
Patient presenting for 3rd rabies vaccine. Administered into the right deltoid. Tolerated well.

## 2022-03-23 ENCOUNTER — Ambulatory Visit (INDEPENDENT_AMBULATORY_CARE_PROVIDER_SITE_OTHER): Payer: 59 | Admitting: Physician Assistant

## 2022-03-23 ENCOUNTER — Other Ambulatory Visit (HOSPITAL_COMMUNITY): Payer: Self-pay

## 2022-03-23 ENCOUNTER — Encounter: Payer: Self-pay | Admitting: Physician Assistant

## 2022-03-23 DIAGNOSIS — F411 Generalized anxiety disorder: Secondary | ICD-10-CM

## 2022-03-23 DIAGNOSIS — F4310 Post-traumatic stress disorder, unspecified: Secondary | ICD-10-CM | POA: Diagnosis not present

## 2022-03-23 DIAGNOSIS — F3341 Major depressive disorder, recurrent, in partial remission: Secondary | ICD-10-CM | POA: Diagnosis not present

## 2022-03-23 DIAGNOSIS — G4700 Insomnia, unspecified: Secondary | ICD-10-CM

## 2022-03-23 MED ORDER — QUETIAPINE FUMARATE 200 MG PO TABS
100.0000 mg | ORAL_TABLET | Freq: Every day | ORAL | 1 refills | Status: DC
  Filled 2022-03-23 – 2022-04-27 (×2): qty 90, 90d supply, fill #0
  Filled 2022-07-21: qty 90, 90d supply, fill #1

## 2022-03-23 MED ORDER — GABAPENTIN 300 MG PO CAPS
600.0000 mg | ORAL_CAPSULE | Freq: Every day | ORAL | 1 refills | Status: DC
  Filled 2022-03-23 – 2022-04-16 (×2): qty 270, 90d supply, fill #0
  Filled 2022-07-15: qty 270, 90d supply, fill #1

## 2022-03-23 MED ORDER — FLUVOXAMINE MALEATE 50 MG PO TABS
50.0000 mg | ORAL_TABLET | Freq: Every day | ORAL | 1 refills | Status: DC
  Filled 2022-03-23 – 2022-05-15 (×2): qty 90, 90d supply, fill #0
  Filled 2022-08-24: qty 90, 90d supply, fill #1

## 2022-03-23 MED ORDER — QUETIAPINE FUMARATE 300 MG PO TABS
300.0000 mg | ORAL_TABLET | Freq: Every evening | ORAL | 1 refills | Status: DC
  Filled 2022-03-23 – 2022-05-14 (×3): qty 90, 90d supply, fill #0
  Filled 2022-08-07: qty 90, 90d supply, fill #1

## 2022-03-23 MED ORDER — FLUVOXAMINE MALEATE 100 MG PO TABS
100.0000 mg | ORAL_TABLET | Freq: Every day | ORAL | 1 refills | Status: DC
  Filled 2022-03-23 – 2022-05-14 (×2): qty 90, 90d supply, fill #0
  Filled 2022-08-07: qty 90, 90d supply, fill #1

## 2022-03-23 NOTE — Progress Notes (Signed)
[facility] Check  Patient ID: MOMOKA STRINGFIELD,  MRN: [id number]  PCP: [name], NP  Date of Evaluation: 03/23/2022 Time spent:30 minutes  Chief Complaint:  Chief Complaint   Anxiety; Depression; Insomnia; Follow-up     HISTORY/CURRENT STATUS: HPI for routine 45-monthfollow-up.  MSharayahstates she is doing really well.  She has lost around 40 pounds, taking Ozempic and seeing someone at a weight loss clinic.  That has been going really well.  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  She likes it very much.  She is more able to enjoy things than she has been in a long time.  Energy is low, thinks partly because she takes several different medications that cause drowsiness.  She is motivated to do things that are needed though.  She does not sleep all the time or want to stay isolated.  Appetite is normal.  Personal hygiene is normal.  Does not cry easily.  No thoughts of self-harm.  No suicidal or homicidal thoughts.  Does have anxiety and still needs the Klonopin but not really having panic attacks, more of a sense of unease like something bad is going to happen when she does have to take it.  She is sleeping well most of the time, no nightmares that she remembers.  Her husband has reported a few that he has had to wake her up from but she does not remember the dreams.  No increased energy with decreased need for sleep, no increased talkativeness, no racing thoughts, no impulsivity or risky behaviors, no increased spending, no increased libido, no grandiosity, no increased irritability or anger, no paranoia, and no hallucinations.  Denies dizziness, syncope, seizures, numbness, tingling, tremor, tics, unsteady gait, slurred speech, confusion. Denies muscle or joint pain, stiffness, or dystonia. Denies unexplained weight loss, frequent infections, or sores that heal slowly.  No  polyphagia, polydipsia, or polyuria. Denies visual changes or paresthesias.    Individual Medical History/ Review of Systems: Changes? :No     Past medications for mental health diagnoses include: Prozac, Paxil, Effexor, Norpramin, Wellbutrin, doxepin, Elavil, Geodon, Abilify, Lexapro, Trileptal, Ambien, Lunesta, Sonata, trazodone, Restoril, Remeron, Seroquel, Klonopin, propranolol, Lamictal, Deplin, Latuda, Xanax made her feel horrible, Ativan didn't help, prazosin    Allergies: Penicillins, Tamiflu [oseltamivir phosphate], Cephalosporins, and Latex  Current Medications:  Current Outpatient Medications:    Carboxymethylcellul-Glycerin (LUBRICATING EYE DROPS OP), Place 1 drop into both eyes daily as needed (dryness/ allergies)., Disp: , Rfl:    cetirizine (ZYRTEC) 10 MG tablet, Take 10 mg by mouth at bedtime., Disp: , Rfl:    Cholecalciferol (VITAMIN D3) 5000 units CAPS, Take 5,000 Units by mouth at bedtime., Disp: , Rfl:    clonazePAM (KLONOPIN) 0.5 MG tablet, Take 1 tablet (0.5 mg total) by mouth every 6 (six) hours as needed for anxiety., Disp: 120 tablet, Rfl: 5   esomeprazole (NEXIUM) 20 MG capsule, Take 20 mg by mouth at bedtime., Disp: , Rfl:    Multiple Vitamins-Minerals (HAIR SKIN AND NAILS FORMULA PO), , Disp: , Rfl:    ondansetron (ZOFRAN-ODT) 8 MG disintegrating tablet, Dissolve 1 tablet by mouth every 8 hours as needed for nausea., Disp: 30 tablet, Rfl: 6   prazosin (MINIPRESS) 2 MG capsule, Take 4 capsules by mouth at bedtime., Disp: 360 capsule, Rfl: 1   propranolol (INDERAL) 40 MG tablet, Take 1 tablet (40 mg total) by mouth  2 (two) times daily., Disp: 180 tablet, Rfl: 0   Semaglutide, 2 MG/DOSE, 8 MG/3ML SOPN, Inject 2 mg as directed once a week., Disp: 3 mL, Rfl: 6   triamcinolone (NASACORT) 55 MCG/ACT AERO nasal inhaler, Place 1 spray into the nose at bedtime., Disp: , Rfl:    Continuous Blood Gluc Sensor (FREESTYLE LIBRE 3 SENSOR) MISC, Place 1 sensor on the skin every 14  days. Use to check glucose continuously, Disp: 2 each, Rfl: 11   fluvoxaMINE (LUVOX) 100 MG tablet, Take 1 tablet by mouth at bedtime. Take with 50 mg dose to equal 150 mg at bedtime, Disp: 90 tablet, Rfl: 1   fluvoxaMINE (LUVOX) 50 MG tablet, TAKE 1 TABLET BY MOUTH AT BEDTIME. TAKE IN ADDITION TO THE 100 MG TABLET TO EQUAL 150 MG TOTAL, Disp: 90 tablet, Rfl: 1   gabapentin (NEURONTIN) 300 MG capsule, Take 2-3 capsules (600-900 mg total) by mouth at bedtime., Disp: 270 capsule, Rfl: 1   QUEtiapine (SEROQUEL) 200 MG tablet, Take 1/2-1 tablet (100-200 mg total) by mouth at bedtime., Disp: 90 tablet, Rfl: 1   QUEtiapine (SEROQUEL) 300 MG tablet, Take 1 tablet by mouth at bedtime., Disp: 90 tablet, Rfl: 1 Medication Side Effects: none  Family Medical/ Social History: Changes?  See HPI.  MENTAL HEALTH EXAM:  Last menstrual period 01/06/2021.There is no height or weight on file to calculate BMI.  General Appearance: Casual, Neat, Well Groomed and Obese  Eye Contact:  Good  Speech:  Clear and Coherent and Normal Rate  Volume:  Normal  Mood:  Euthymic  Affect:  Appropriate  Thought Process:  Goal Directed and Descriptions of Associations: Circumstantial  Orientation:  Full (Time, Place, and Person)  Thought Content: Logical   Suicidal Thoughts:  No  Homicidal Thoughts:  No  Memory:  WNL  Judgement:  Good  Insight:  Good  Psychomotor Activity:  Normal  Concentration:  Concentration: Good and Attention Span: Good  Recall:  Good  Fund of Knowledge: Good  Language: Good  Assets:  Desire for Improvement Financial Resources/Insurance Housing Transportation Vocational/Educational  ADL's:  Intact  Cognition: WNL  Prognosis:  Good   12/29/2021 Lipids total cholesterol 146, triglycerides 279, HDL 35, LDL 66 Hemoglobin A1c 5.5 Vitamin D 92  DIAGNOSES:    ICD-10-CM   1. PTSD (post-traumatic stress disorder)  F43.10     2. Recurrent major depressive disorder, in partial remission ([place])   F33.41     3. Generalized anxiety disorder  F41.1     4. Insomnia, unspecified type  G47.00      Receiving Psychotherapy: Yes Heather [facility]   RECOMMENDATIONS:  PDMP was reviewed.  Last Klonopin filled 02/27/2022.   I provided 30 minutes of face to face time during this encounter, including time spent before and after the visit in records review, medical decision making, counseling pertinent to today's visit, and charting.   I am glad to see her doing so well!  No change in medications at this time.  We discussed the increased lipids.  She is working on that with the weight loss clinic.  She is eating more meats and less carbs so she feels that is the problem.  At some point she really wants to decrease or get off of some of the medications because of the fatigue she feels.  But we agree this is not a good time due to the weight loss journey, relatively new job, and teenage son.  We can discuss this next spring at  the earliest, unless something changes.  She understands and agrees.  Continue Klonopin 0.5 mg, 1 p.o. 4 times daily as needed. Continue Luvox 150 mg nightly. Continue gabapentin 300 mg, 2-3 p.o. q. evening.  (She usually only takes 2.) Continue prazosin 2 mg, 4 nightly. Continue propranolol 40 mg, 1 p.o. every morning. Continue Seroquel 500 mg nightly. Continue counseling with Heather Mask. Return in 6 months.  Donnal Moat, PA-C

## 2022-03-24 DIAGNOSIS — F4310 Post-traumatic stress disorder, unspecified: Secondary | ICD-10-CM | POA: Diagnosis not present

## 2022-03-26 ENCOUNTER — Other Ambulatory Visit (HOSPITAL_BASED_OUTPATIENT_CLINIC_OR_DEPARTMENT_OTHER): Payer: Self-pay | Admitting: Nurse Practitioner

## 2022-03-26 DIAGNOSIS — E781 Pure hyperglyceridemia: Secondary | ICD-10-CM

## 2022-03-26 DIAGNOSIS — E7849 Other hyperlipidemia: Secondary | ICD-10-CM

## 2022-03-26 DIAGNOSIS — E8881 Metabolic syndrome: Secondary | ICD-10-CM

## 2022-03-26 DIAGNOSIS — R030 Elevated blood-pressure reading, without diagnosis of hypertension: Secondary | ICD-10-CM

## 2022-03-26 DIAGNOSIS — R7303 Prediabetes: Secondary | ICD-10-CM

## 2022-03-26 MED ORDER — WEGOVY 2.4 MG/0.75ML ~~LOC~~ SOAJ
2.4000 mg | SUBCUTANEOUS | 3 refills | Status: DC
  Filled 2022-03-26 – 2022-04-02 (×5): qty 3, 28d supply, fill #0
  Filled 2022-04-27: qty 3, 28d supply, fill #1
  Filled 2022-05-25: qty 3, 28d supply, fill #2
  Filled 2022-06-19: qty 3, 28d supply, fill #3
  Filled 2022-07-15: qty 3, 28d supply, fill #4
  Filled 2022-08-13: qty 3, 28d supply, fill #5
  Filled 2022-09-09: qty 3, 28d supply, fill #6
  Filled 2022-10-13: qty 3, 28d supply, fill #7
  Filled 2022-11-07: qty 3, 28d supply, fill #8
  Filled 2022-12-05 – 2022-12-07 (×3): qty 3, 28d supply, fill #9
  Filled 2023-01-04: qty 3, 28d supply, fill #10

## 2022-03-27 ENCOUNTER — Ambulatory Visit: Payer: 59 | Admitting: Obstetrics and Gynecology

## 2022-03-27 ENCOUNTER — Other Ambulatory Visit (HOSPITAL_COMMUNITY): Payer: Self-pay

## 2022-03-28 ENCOUNTER — Encounter (HOSPITAL_COMMUNITY): Payer: Self-pay

## 2022-03-28 ENCOUNTER — Ambulatory Visit (HOSPITAL_COMMUNITY)
Admission: EM | Admit: 2022-03-28 | Discharge: 2022-03-28 | Disposition: A | Discharge status: Home / Self Care | Payer: 59 | Attending: Internal Medicine | Admitting: Internal Medicine

## 2022-03-28 DIAGNOSIS — Z203 Contact with and (suspected) exposure to rabies: Secondary | ICD-10-CM | POA: Diagnosis not present

## 2022-03-28 MED ORDER — RABIES VACCINE, PCEC IM SUSR
1.0000 mL | Freq: Once | INTRAMUSCULAR | Status: AC
  Administered 2022-03-28: 1 mL via INTRAMUSCULAR

## 2022-03-28 MED ORDER — RABIES VACCINE, PCEC IM SUSR
INTRAMUSCULAR | Status: AC
  Filled 2022-03-28: qty 1

## 2022-03-28 NOTE — ED Triage Notes (Signed)
Pt here to get 4th and last rabies injection. Denies any complications.

## 2022-03-30 ENCOUNTER — Other Ambulatory Visit (HOSPITAL_COMMUNITY): Payer: Self-pay

## 2022-03-31 ENCOUNTER — Other Ambulatory Visit (HOSPITAL_COMMUNITY): Payer: Self-pay

## 2022-03-31 ENCOUNTER — Telehealth (HOSPITAL_BASED_OUTPATIENT_CLINIC_OR_DEPARTMENT_OTHER): Payer: Self-pay

## 2022-03-31 DIAGNOSIS — F4310 Post-traumatic stress disorder, unspecified: Secondary | ICD-10-CM | POA: Diagnosis not present

## 2022-03-31 NOTE — Telephone Encounter (Signed)
PA for [facility] 2.4 MG processed and faxed. Awaiting response

## 2022-04-02 ENCOUNTER — Other Ambulatory Visit (HOSPITAL_COMMUNITY): Payer: Self-pay

## 2022-04-07 DIAGNOSIS — F4310 Post-traumatic stress disorder, unspecified: Secondary | ICD-10-CM | POA: Diagnosis not present

## 2022-04-07 NOTE — Telephone Encounter (Signed)
PA approved.

## 2022-04-16 ENCOUNTER — Other Ambulatory Visit (HOSPITAL_COMMUNITY): Payer: Self-pay

## 2022-04-22 ENCOUNTER — Encounter (INDEPENDENT_AMBULATORY_CARE_PROVIDER_SITE_OTHER): Payer: Self-pay

## 2022-04-24 DIAGNOSIS — F4310 Post-traumatic stress disorder, unspecified: Secondary | ICD-10-CM | POA: Diagnosis not present

## 2022-04-27 ENCOUNTER — Other Ambulatory Visit (HOSPITAL_COMMUNITY): Payer: Self-pay

## 2022-04-28 ENCOUNTER — Other Ambulatory Visit (HOSPITAL_COMMUNITY): Payer: Self-pay

## 2022-04-30 DIAGNOSIS — F4310 Post-traumatic stress disorder, unspecified: Secondary | ICD-10-CM | POA: Diagnosis not present

## 2022-04-30 NOTE — Progress Notes (Signed)
46 y.o. G3P1021 Married White or Caucasian Not Hispanic or Latino female here for annual exam.  She is having pain with sex, hurts at the introitus posteriorly. Not helped with lubricant.  She is also having night sweats and low libido.  H/O TLH/BS in 5/22.  Has some constipation from her medication, helped with colace.  No bladder c/o.     Patient's last menstrual period was 01/06/2021.          Sexually active: Yes.    The current method of family planning is status post hysterectomy.    Exercising: Yes.    The patient has a physically strenuous job, but has no regular exercise apart from work.  Smoker:  no  Health Maintenance: Pap:  01/01/21 WNL Hr HPV Neg,  12-12-15 WNL NEG HR HPV History of abnormal Pap:  no MMG:  02/13/22 density B Bi-rads 1 neg  BMD:   none  Colonoscopy: never  TDaP:  2017 Gardasil: n/a   reports that she quit smoking about 21 years ago. Her smoking use included cigarettes. She has a 20.00 pack-year smoking history. She has never used smokeless tobacco. She reports that she does not currently use alcohol. She reports that she does not use drugs. Son is 91.   Past Medical History:  Diagnosis Date   Allergy    Zyrtec.   Anxiety    followed by Dr. Toy Cookey   Back pain    Complication of anesthesia    Depression    Fibroid    Gastroparesis 09/14/2012   gastric emptying study in 2014   GERD (gastroesophageal reflux disease)    Headache    Pneumonia    2013ish   PONV (postoperative nausea and vomiting)     likes scopolamine patch and zofran /phenergan helps   Pre-diabetes    PTSD (post-traumatic stress disorder)    PTSD (post-traumatic stress disorder)    Sleep apnea 07/01/2018   cpap optional, pt close to not use cpap   Vitamin D deficiency    Wears glasses    Wears glasses     Past Surgical History:  Procedure Laterality Date   ABDOMINAL HYSTERECTOMY  01/2021   ANKLE ARTHROSCOPY Left 2011   CESAREAN SECTION  06/2006   x 1   CHOLECYSTECTOMY   07/2006   laparoscopic   CYSTOSCOPY N/A 02/04/2021   Procedure: CYSTOSCOPY;  Surgeon: Salvadore Dom, MD;  Location: [facility];  Service: Gynecology;  Laterality: N/A;   DILATION AND CURETTAGE OF UTERUS  1997   x 2   FOREIGN BODY REMOVAL Left 11/18/2016   Procedure: REMOVAL FOREIGN BODY EXTREMITY LEFT FOOT;  Surgeon: Trula Slade, DPM;  Location: [facility];  Service: Podiatry;  Laterality: Left;   KNEE ARTHROSCOPY WITH MEDIAL MENISECTOMY Right 09/10/2021   Procedure: KNEE ARTHROSCOPY WITH MEDIAL MENISCAL ROOT REPAIR;  Surgeon: Hiram Gash, MD;  Location: [facility];  Service: Orthopedics;  Laterality: Right;   PILONIDAL CYST EXCISION  1990's   RADIOLOGY WITH ANESTHESIA N/A 11/10/2018   Procedure: MRI WITH ANESTHESIA OF BRAIN AND ORBITS WITH AND WITHOUT CONTRAST;  Surgeon: Radiologist, Medication, MD;  Location: [facility];  Service: Radiology;  Laterality: N/A;   TENDON REPAIR Left 2011   Left Ankle   TOTAL LAPAROSCOPIC HYSTERECTOMY WITH SALPINGECTOMY Bilateral 02/04/2021   Procedure: TOTAL LAPAROSCOPIC HYSTERECTOMY WITH SALPINGECTOMY;  Surgeon: Salvadore Dom, MD;  Location: [facility];  Service: Gynecology;  Laterality: Bilateral;   WISDOM TOOTH EXTRACTION  1990's  Current Outpatient Medications  Medication Sig Dispense Refill   Carboxymethylcellul-Glycerin (LUBRICATING EYE DROPS OP) Place 1 drop into both eyes daily as needed (dryness/ allergies).     cetirizine (ZYRTEC) 10 MG tablet Take 10 mg by mouth at bedtime.     Cholecalciferol (VITAMIN D3) 5000 units CAPS Take 5,000 Units by mouth at bedtime.     clonazePAM (KLONOPIN) 0.5 MG tablet Take 1 tablet (0.5 mg total) by mouth every 6 (six) hours as needed for anxiety. 120 tablet 5   docusate sodium (COLACE) 100 MG capsule Take 100 mg by mouth 2 (two) times daily.     esomeprazole (NEXIUM) 20 MG capsule Take 20 mg by mouth at bedtime.     fluvoxaMINE (LUVOX) 100 MG tablet Take 1 tablet by mouth at bedtime. Take with  50 mg dose to equal 150 mg at bedtime 90 tablet 1   fluvoxaMINE (LUVOX) 50 MG tablet TAKE 1 TABLET BY MOUTH AT BEDTIME. TAKE IN ADDITION TO THE 100 MG TABLET TO EQUAL 150 MG TOTAL 90 tablet 1   gabapentin (NEURONTIN) 300 MG capsule Take 2-3 capsules (600-900 mg total) by mouth at bedtime. 270 capsule 1   ondansetron (ZOFRAN-ODT) 8 MG disintegrating tablet Dissolve 1 tablet by mouth every 8 hours as needed for nausea. 30 tablet 6   prazosin (MINIPRESS) 2 MG capsule Take 4 capsules by mouth at bedtime. 360 capsule 1   propranolol (INDERAL) 40 MG tablet Take 1 tablet (40 mg total) by mouth 2 (two) times daily. 180 tablet 0   QUEtiapine (SEROQUEL) 200 MG tablet Take 1/2-1 tablet (100-200 mg total) by mouth at bedtime. 90 tablet 1   QUEtiapine (SEROQUEL) 300 MG tablet Take 1 tablet by mouth at bedtime. 90 tablet 1   Semaglutide-Weight Management (WEGOVY) 2.4 MG/0.75ML SOAJ Inject 2.4 mg into the skin once a week. 9 mL 3   triamcinolone (NASACORT) 55 MCG/ACT AERO nasal inhaler Place 1 spray into the nose at bedtime.     Continuous Blood Gluc Sensor (FREESTYLE LIBRE 3 SENSOR) MISC Place 1 sensor on the skin every 14 days. Use to check glucose continuously 2 each 11   No current facility-administered medications for this visit.    Family History  Problem Relation Age of Onset   Cancer Mother        squamous cell carcinoma   Hyperlipidemia Mother    Depression Mother    Anxiety disorder Mother    Obesity Mother    Heart disease Father        cardiomegaly, CHF; steroid use.   Hyperlipidemia Father    Hypertension Father    Mental retardation Father    High blood pressure Father    Depression Father    Anxiety disorder Father    Obesity Father    Rheum arthritis Sister    Multiple sclerosis Sister    Autoimmune disease Sister    Drug abuse Sister    Breast cancer Maternal Aunt    Thyroid cancer Paternal Aunt    Cancer Maternal Grandmother    Diabetes Maternal Grandmother    Heart disease  Maternal Grandmother    Hyperlipidemia Maternal Grandmother    Hypertension Maternal Grandmother    Stroke Maternal Grandmother    Heart disease Paternal Grandmother    Hypertension Paternal Grandmother    Heart disease Paternal Grandfather    Hyperlipidemia Paternal Grandfather    Mental illness Paternal Grandfather     Review of Systems  Exam:   BP 102/62  Pulse 77   Wt 265 lb (120.2 kg)   LMP 01/06/2021   SpO2 100%   BMI 44.10 kg/m   Weight change: '@WEIGHTCHANGE'$ @ Height:      Ht Readings from Last 3 Encounters:  03/14/22 '5\' 5"'$  (1.651 m)  12/29/21 '5\' 5"'$  (1.651 m)  11/13/21 '5\' 5"'$  (1.651 m)    General appearance: alert, cooperative and appears stated age Head: Normocephalic, without obvious abnormality, atraumatic Neck: no adenopathy, supple, symmetrical, trachea midline and thyroid normal to inspection and palpation Lungs: clear to auscultation bilaterally Cardiovascular: regular rate and rhythm Breasts: normal appearance, no masses or tenderness Abdomen: soft, non-tender; non distended,  no masses,  no organomegaly Extremities: extremities normal, atraumatic, no cyanosis or edema Skin: Skin color, texture, turgor normal. No rashes or lesions Lymph nodes: Cervical, supraclavicular, and axillary nodes normal. No abnormal inguinal nodes palpated Neurologic: Grossly normal   Pelvic: External genitalia:  no lesions, the posterior fourchette tissue is thin              Urethra:  normal appearing urethra with no masses, tenderness or lesions              Bartholins and Skenes: normal                 Vagina: normal appearing vagina with normal color and discharge, no lesions              Cervix: absent               Bimanual Exam:  Uterus:  uterus absent              Adnexa: no mass, fullness, tenderness               Rectovaginal: Confirms               Anus:  normal sphincter tone, no lesions  Gae Dry, CMA chaperoned for the exam.  1. Well woman exam Discussed  breast self exam Discussed calcium and vit D intake Mammogram UTD Labs with primary No pap needed  2. Night sweats - Follicle stimulating hormone  3. Dyspareunia, female Discussed Saria controlling the rate and depth of penetration, using lots of lubrication (samples of uberlube given). Her tissue appears thin in the area of discomfort, will try topical estrogen cream - estradiol (ESTRACE) 0.1 MG/GM vaginal cream; Place a pea sized amount topically q day for one week, then change to twice weekly  Dispense: 42.5 g; Refill: 1  4. Low libido Discussed multiple possible causes, her antidepressant is known to cause a decrease in libido. Reading suggestions given  5. Colon cancer screening - Ambulatory referral to Gastroenterology

## 2022-05-07 ENCOUNTER — Ambulatory Visit (INDEPENDENT_AMBULATORY_CARE_PROVIDER_SITE_OTHER): Payer: 59 | Admitting: Obstetrics and Gynecology

## 2022-05-07 ENCOUNTER — Encounter: Payer: Self-pay | Admitting: Obstetrics and Gynecology

## 2022-05-07 VITALS — BP 102/62 | HR 77 | Wt 265.0 lb

## 2022-05-07 DIAGNOSIS — R6882 Decreased libido: Secondary | ICD-10-CM | POA: Diagnosis not present

## 2022-05-07 DIAGNOSIS — N941 Unspecified dyspareunia: Secondary | ICD-10-CM | POA: Diagnosis not present

## 2022-05-07 DIAGNOSIS — R61 Generalized hyperhidrosis: Secondary | ICD-10-CM | POA: Diagnosis not present

## 2022-05-07 DIAGNOSIS — Z01419 Encounter for gynecological examination (general) (routine) without abnormal findings: Secondary | ICD-10-CM

## 2022-05-07 DIAGNOSIS — Z1211 Encounter for screening for malignant neoplasm of colon: Secondary | ICD-10-CM

## 2022-05-07 MED ORDER — ESTRADIOL 0.1 MG/GM VA CREA
TOPICAL_CREAM | VAGINAL | 1 refills | Status: DC
  Filled 2022-05-07: qty 42.5, 30d supply, fill #0

## 2022-05-07 NOTE — Patient Instructions (Signed)

## 2022-05-08 ENCOUNTER — Other Ambulatory Visit (HOSPITAL_COMMUNITY): Payer: Self-pay

## 2022-05-08 LAB — FOLLICLE STIMULATING HORMONE: FSH: 40.1 m[IU]/mL

## 2022-05-12 DIAGNOSIS — F4310 Post-traumatic stress disorder, unspecified: Secondary | ICD-10-CM | POA: Diagnosis not present

## 2022-05-15 ENCOUNTER — Other Ambulatory Visit (HOSPITAL_COMMUNITY): Payer: Self-pay

## 2022-05-19 ENCOUNTER — Other Ambulatory Visit (HOSPITAL_COMMUNITY): Payer: Self-pay

## 2022-05-19 DIAGNOSIS — F4310 Post-traumatic stress disorder, unspecified: Secondary | ICD-10-CM | POA: Diagnosis not present

## 2022-05-22 ENCOUNTER — Other Ambulatory Visit: Payer: Self-pay | Admitting: Physician Assistant

## 2022-05-22 ENCOUNTER — Other Ambulatory Visit (HOSPITAL_COMMUNITY): Payer: Self-pay

## 2022-05-22 MED ORDER — PROPRANOLOL HCL 40 MG PO TABS
40.0000 mg | ORAL_TABLET | Freq: Two times a day (BID) | ORAL | 0 refills | Status: DC
  Filled 2022-05-22: qty 180, 90d supply, fill #0

## 2022-05-22 MED ORDER — CLONAZEPAM 0.5 MG PO TABS
0.5000 mg | ORAL_TABLET | Freq: Four times a day (QID) | ORAL | 5 refills | Status: DC | PRN
  Filled 2022-05-22: qty 120, 30d supply, fill #0
  Filled 2022-06-29: qty 120, 30d supply, fill #1
  Filled 2022-07-27: qty 120, 30d supply, fill #2
  Filled 2022-08-24: qty 120, 30d supply, fill #3

## 2022-05-25 ENCOUNTER — Other Ambulatory Visit (HOSPITAL_COMMUNITY): Payer: Self-pay

## 2022-05-26 ENCOUNTER — Other Ambulatory Visit (HOSPITAL_COMMUNITY): Payer: Self-pay

## 2022-05-26 DIAGNOSIS — F4310 Post-traumatic stress disorder, unspecified: Secondary | ICD-10-CM | POA: Diagnosis not present

## 2022-05-27 ENCOUNTER — Other Ambulatory Visit (HOSPITAL_COMMUNITY): Payer: Self-pay

## 2022-06-09 DIAGNOSIS — F4310 Post-traumatic stress disorder, unspecified: Secondary | ICD-10-CM | POA: Diagnosis not present

## 2022-06-16 ENCOUNTER — Other Ambulatory Visit (HOSPITAL_COMMUNITY): Payer: Self-pay

## 2022-06-19 ENCOUNTER — Other Ambulatory Visit (HOSPITAL_COMMUNITY): Payer: Self-pay

## 2022-06-23 DIAGNOSIS — F4310 Post-traumatic stress disorder, unspecified: Secondary | ICD-10-CM | POA: Diagnosis not present

## 2022-06-29 ENCOUNTER — Other Ambulatory Visit (HOSPITAL_COMMUNITY): Payer: Self-pay

## 2022-06-29 ENCOUNTER — Telehealth: Payer: Self-pay | Admitting: Physician Assistant

## 2022-06-29 NOTE — Telephone Encounter (Signed)
Next visit is 09/23/22. Caroline Gonzales called and said her Prazosin 8 mg was increased to 10 mg and the nightmares continue. Could someone please call her at [phone]. Does she need to adjust the dosing?

## 2022-06-29 NOTE — Telephone Encounter (Signed)
I don't see documentation of it being increased to 10 mg.  I must be overlooking it.  Is her blood pressure running okay?  If she having symptoms of orthostatic hypotension?  If she is tolerating the 10 mg okay then she can increase to 11 mg nightly.  If she is not tolerating it well, I recommend increasing the Seroquel.

## 2022-06-29 NOTE — Telephone Encounter (Signed)
Noted  

## 2022-06-29 NOTE — Telephone Encounter (Signed)
Please review

## 2022-06-29 NOTE — Telephone Encounter (Signed)
LVM to rtc 

## 2022-06-29 NOTE — Telephone Encounter (Signed)
Spoke to pt.She is asking to increase to 10 mg she is still on 8 mg.I told her yes since you approved to increase to 11 mg but she will take the 10 mg.She said she has not been taking her BP but has no symptoms of hypotension.Im not sure if there is a 10 mg pill but she wants a new rx sent for when she runs out of what she has

## 2022-06-30 DIAGNOSIS — F4310 Post-traumatic stress disorder, unspecified: Secondary | ICD-10-CM | POA: Diagnosis not present

## 2022-07-02 ENCOUNTER — Encounter (HOSPITAL_BASED_OUTPATIENT_CLINIC_OR_DEPARTMENT_OTHER): Payer: Self-pay | Admitting: Nurse Practitioner

## 2022-07-02 ENCOUNTER — Ambulatory Visit (INDEPENDENT_AMBULATORY_CARE_PROVIDER_SITE_OTHER): Payer: 59 | Admitting: Nurse Practitioner

## 2022-07-02 VITALS — BP 110/61 | HR 89 | Ht 65.0 in | Wt 261.5 lb

## 2022-07-02 DIAGNOSIS — Z6841 Body Mass Index (BMI) 40.0 and over, adult: Secondary | ICD-10-CM

## 2022-07-02 DIAGNOSIS — R030 Elevated blood-pressure reading, without diagnosis of hypertension: Secondary | ICD-10-CM

## 2022-07-02 DIAGNOSIS — E88819 Insulin resistance, unspecified: Secondary | ICD-10-CM | POA: Diagnosis not present

## 2022-07-02 DIAGNOSIS — Z1211 Encounter for screening for malignant neoplasm of colon: Secondary | ICD-10-CM

## 2022-07-02 DIAGNOSIS — R7303 Prediabetes: Secondary | ICD-10-CM

## 2022-07-02 HISTORY — DX: Encounter for screening for malignant neoplasm of colon: Z12.11

## 2022-07-02 NOTE — Progress Notes (Signed)
[name], DNP, AGNP-c [facility] [address] [place], [place] [address] [phone] Office [phone] Fax  ESTABLISHED PATIENT- Chronic Health and/or Follow-Up Visit  Blood pressure 110/61, pulse 89, height '5\' 5"'$  (1.651 m), weight 261 lb 8 oz (118.6 kg), last menstrual period 01/06/2021, SpO2 98 %.    Caroline Gonzales is a 46 y.o. year old female presenting today for evaluation and management of the following: Follow-up (Patient presents today for follow up weight and labs. Caroline Gonzales is not working she feels like she is stuck. )  Weight Management [name] expresses concerns today that the Caroline Gonzales is not working as well as it has in the past. Since starting in January of this year she has lost over 40 pounds and was initially doing very well on the medication. Over the last few months she reports that she has been unable to loose any additional weight and she is not sure if the medication is still working for her. She does still endorse satiety up until the day before her next injection is due. She tells me she has been under a considerable amount of stress over the past several months and she is unsure if that is contributing to the stagnation.    All ROS negative with exception of what is listed above.   PHYSICAL EXAM Physical Exam Vitals and nursing note reviewed.  Constitutional:      General: She is not in acute distress.    Appearance: Normal appearance.  HENT:     Head: Normocephalic.  Eyes:     Extraocular Movements: Extraocular movements intact.     Conjunctiva/sclera: Conjunctivae normal.     Pupils: Pupils are equal, round, and reactive to light.  Neck:     Vascular: No carotid bruit.  Cardiovascular:     Rate and Rhythm: Normal rate and regular rhythm.     Pulses: Normal pulses.     Heart sounds: Normal heart sounds. No murmur heard. Pulmonary:     Effort: Pulmonary effort is normal.     Breath sounds: Normal  breath sounds. No wheezing.  Abdominal:     General: Bowel sounds are normal. There is no distension.     Palpations: Abdomen is soft.     Tenderness: There is no abdominal tenderness. There is no guarding.  Musculoskeletal:        General: Normal range of motion.     Cervical back: Normal range of motion and neck supple.     Right lower leg: No edema.     Left lower leg: No edema.  Lymphadenopathy:     Cervical: No cervical adenopathy.  Skin:    General: Skin is warm and dry.     Capillary Refill: Capillary refill takes less than 2 seconds.  Neurological:     General: No focal deficit present.     Mental Status: She is alert and oriented to person, place, and time.  Psychiatric:        Mood and Affect: Mood normal.        Behavior: Behavior normal.        Thought Content: Thought content normal.        Judgment: Judgment normal.     PLAN Problem List Items Addressed This Visit     Morbid obesity with body mass index (BMI) of 50.0 to 59.9 in adult ([place]) - Primary    Stagnation in weight loss. Discussion with patient reveals that she is under considerable stress  at this time. Patient reassured that stress can cause weight loss to be more difficult. At this time, I feel it is best for Caroline Gonzales to focus on her mental health and safety and then we will address weight management once she has achieved a better balance in her life. She is in agreement with this plan. We will follow-up in 6 months or sooner if needed.       Relevant Orders   Hemoglobin A1c (Completed)   VITAMIN D 25 Hydroxy (Vit-D Deficiency, Fractures) (Completed)   CBC With Diff/Platelet (Completed)   Comprehensive metabolic panel (Completed)   Pre-diabetes    Chronic. Stagnation with weight loss due to increased stress. Encouraged her to work through her stressors at this time and we will focus on other aspects of health in the future. Recommend mindful monitoring of her diet to avoid significant set backs during  this time.       Relevant Orders   Hemoglobin A1c (Completed)   VITAMIN D 25 Hydroxy (Vit-D Deficiency, Fractures) (Completed)   CBC With Diff/Platelet (Completed)   Comprehensive metabolic panel (Completed)   Insulin resistance    See prediabetes      Relevant Orders   Hemoglobin A1c (Completed)   VITAMIN D 25 Hydroxy (Vit-D Deficiency, Fractures) (Completed)   CBC With Diff/Platelet (Completed)   Comprehensive metabolic panel (Completed)   Elevated blood pressure reading without diagnosis of hypertension    Blood pressure stable today. No concerns at this time.       Relevant Orders   Hemoglobin A1c (Completed)   VITAMIN D 25 Hydroxy (Vit-D Deficiency, Fractures) (Completed)   CBC With Diff/Platelet (Completed)   Comprehensive metabolic panel (Completed)   Screening for colon cancer   Relevant Orders   Ambulatory referral to Gastroenterology    Return in about 6 months (around 01/01/2023).   [name], DNP, AGNP-c 07/02/2022  1:13 PM

## 2022-07-02 NOTE — Patient Instructions (Addendum)
I want you to focus on you. You are doing a great job!! I am proud of you.   If you need anything at all, please call us.     Marlena,   I want to thank you for trusting me with your primary care services for nearly the last two years at [facility]. It is been an [employer] minerals to serve so many wonderful people in the community in a new and innovative facility in [place]. I was recently presented with a new opportunity that will allow me to continue to serve as a primary care provider within [facility] at a different location. After much contemplation, I have made the decision to make this transition. I never dreamed of leaving [facility] and will leave a piece of my heart in this amazing facility. The new office provides an opportunity to continue to provide the level of care my patients have always experienced with improved work-life balance for myself.    Dr. Burnard Bunting will be taking over patients who would like to stay at [facility] and he will be happy to continue your care. I know many of you utilize numerous services in the building and the convenience of the location has been a large draw to the facility, therefore staying in one location offers a great benefit to you. If you plan to stay here, the office staff will be happy to help you with this transition when you schedule a follow-up or your next appointment. They are actively searching for a replacement for me and will hopefully have a new provider in place by the first of the year.  Of course, I will be thrilled to continue to provide care for any patients wishing to follow me. I will be accepting current and new patients at the new location.   Starting August 03, 2022 I will be seeing patients at [facility] located at [address] [place], [place] [address].  If you would like to be placed on the schedule at the new location, please feel free to call [phone] and they will be happy to get you on the schedule at your  convenience.   I look forward to continue to serve my community and hopefully see many familiar faces I love. If you are unable to transition, I completely understand. Thank you all for your understanding, transition is never easy, but I am hopeful you will find the transition to be positive for your care.   Sincerely,  [name], DNP, AGNP-c

## 2022-07-03 LAB — CBC WITH DIFF/PLATELET
Basophils Absolute: 0 10*3/uL (ref 0.0–0.2)
Basos: 1 %
EOS (ABSOLUTE): 0.3 10*3/uL (ref 0.0–0.4)
Eos: 4 %
Hematocrit: 38.3 % (ref 34.0–46.6)
Hemoglobin: 12.5 g/dL (ref 11.1–15.9)
Immature Grans (Abs): 0 10*3/uL (ref 0.0–0.1)
Immature Granulocytes: 0 %
Lymphocytes Absolute: 2 10*3/uL (ref 0.7–3.1)
Lymphs: 31 %
MCH: 27.8 pg (ref 26.6–33.0)
MCHC: 32.6 g/dL (ref 31.5–35.7)
MCV: 85 fL (ref 79–97)
Monocytes Absolute: 0.6 10*3/uL (ref 0.1–0.9)
Monocytes: 9 %
Neutrophils Absolute: 3.6 10*3/uL (ref 1.4–7.0)
Neutrophils: 55 %
Platelets: 301 10*3/uL (ref 150–450)
RBC: 4.49 x10E6/uL (ref 3.77–5.28)
RDW: 14.6 % (ref 11.7–15.4)
WBC: 6.5 10*3/uL (ref 3.4–10.8)

## 2022-07-03 LAB — COMPREHENSIVE METABOLIC PANEL
ALT: 19 IU/L (ref 0–32)
AST: 19 IU/L (ref 0–40)
Albumin/Globulin Ratio: 1.1 — ABNORMAL LOW (ref 1.2–2.2)
Albumin: 4 g/dL (ref 3.9–4.9)
Alkaline Phosphatase: 129 IU/L — ABNORMAL HIGH (ref 44–121)
BUN/Creatinine Ratio: 11 (ref 9–23)
BUN: 12 mg/dL (ref 6–24)
Bilirubin Total: 0.2 mg/dL (ref 0.0–1.2)
CO2: 23 mmol/L (ref 20–29)
Calcium: 9.1 mg/dL (ref 8.7–10.2)
Chloride: 102 mmol/L (ref 96–106)
Creatinine, Ser: 1.06 mg/dL — ABNORMAL HIGH (ref 0.57–1.00)
Globulin, Total: 3.7 g/dL (ref 1.5–4.5)
Glucose: 93 mg/dL (ref 70–99)
Potassium: 4.7 mmol/L (ref 3.5–5.2)
Sodium: 142 mmol/L (ref 134–144)
Total Protein: 7.7 g/dL (ref 6.0–8.5)
eGFR: 66 mL/min/{1.73_m2} (ref >59)

## 2022-07-03 LAB — VITAMIN D 25 HYDROXY (VIT D DEFICIENCY, FRACTURES): Vit D, 25-Hydroxy: 64.1 ng/mL (ref 30.0–100.0)

## 2022-07-03 LAB — HEMOGLOBIN A1C
Est. average glucose Bld gHb Est-mCnc: 111 mg/dL
Hgb A1c MFr Bld: 5.5 % (ref 4.8–5.6)

## 2022-07-07 DIAGNOSIS — F4310 Post-traumatic stress disorder, unspecified: Secondary | ICD-10-CM | POA: Diagnosis not present

## 2022-07-13 DIAGNOSIS — L732 Hidradenitis suppurativa: Secondary | ICD-10-CM | POA: Diagnosis not present

## 2022-07-13 DIAGNOSIS — Z1283 Encounter for screening for malignant neoplasm of skin: Secondary | ICD-10-CM | POA: Diagnosis not present

## 2022-07-13 DIAGNOSIS — L820 Inflamed seborrheic keratosis: Secondary | ICD-10-CM | POA: Diagnosis not present

## 2022-07-13 DIAGNOSIS — D225 Melanocytic nevi of trunk: Secondary | ICD-10-CM | POA: Diagnosis not present

## 2022-07-14 DIAGNOSIS — F4310 Post-traumatic stress disorder, unspecified: Secondary | ICD-10-CM | POA: Diagnosis not present

## 2022-07-15 ENCOUNTER — Other Ambulatory Visit: Payer: Self-pay | Admitting: Physician Assistant

## 2022-07-15 ENCOUNTER — Other Ambulatory Visit (HOSPITAL_COMMUNITY): Payer: Self-pay

## 2022-07-15 MED ORDER — PRAZOSIN HCL 5 MG PO CAPS
10.0000 mg | ORAL_CAPSULE | Freq: Every day | ORAL | 0 refills | Status: DC
  Filled 2022-07-15: qty 60, 30d supply, fill #0

## 2022-07-15 MED ORDER — CLINDAMYCIN PHOSPHATE 1 % EX SOLN
1.0000 | Freq: Two times a day (BID) | CUTANEOUS | 3 refills | Status: DC | PRN
  Filled 2022-07-15: qty 60, 30d supply, fill #0

## 2022-07-15 NOTE — Telephone Encounter (Signed)
Pt called and said that she is taking 10 mg of this medicine. Please send in a script for that dose. Helene Kelp told her to increase it to 10 mg and so she is out early

## 2022-07-16 ENCOUNTER — Other Ambulatory Visit (HOSPITAL_COMMUNITY): Payer: Self-pay

## 2022-07-16 ENCOUNTER — Encounter (HOSPITAL_BASED_OUTPATIENT_CLINIC_OR_DEPARTMENT_OTHER): Payer: Self-pay | Admitting: Nurse Practitioner

## 2022-07-16 NOTE — Assessment & Plan Note (Signed)
Chronic. Stagnation with weight loss due to increased stress. Encouraged her to work through her stressors at this time and we will focus on other aspects of health in the future. Recommend mindful monitoring of her diet to avoid significant set backs during this time.

## 2022-07-16 NOTE — Assessment & Plan Note (Signed)
Blood pressure stable today. No concerns at this time.

## 2022-07-16 NOTE — Assessment & Plan Note (Signed)
Stagnation in weight loss. Discussion with patient reveals that she is under considerable stress at this time. Patient reassured that stress can cause weight loss to be more difficult. At this time, I feel it is best for Caroline Gonzales to focus on her mental health and safety and then we will address weight management once she has achieved a better balance in her life. She is in agreement with this plan. We will follow-up in 6 months or sooner if needed.

## 2022-07-16 NOTE — Assessment & Plan Note (Signed)
See prediabetes

## 2022-07-16 NOTE — Addendum Note (Signed)
Addended by: Khyson Sebesta, Clarise Cruz E on: 07/16/2022 07:33 PM   Modules accepted: Orders

## 2022-07-20 ENCOUNTER — Telehealth: Payer: 59 | Admitting: Physician Assistant

## 2022-07-20 DIAGNOSIS — B001 Herpesviral vesicular dermatitis: Secondary | ICD-10-CM

## 2022-07-20 MED ORDER — VALACYCLOVIR HCL 1 G PO TABS
2000.0000 mg | ORAL_TABLET | Freq: Two times a day (BID) | ORAL | 0 refills | Status: AC
  Filled 2022-07-20: qty 4, 1d supply, fill #0

## 2022-07-20 NOTE — Progress Notes (Signed)
We are sorry that you are not feeling well.  Here is how we plan to help!  Based on what you have shared with me it does look like you have a viral infection.    Most cold sores or fever blisters are small fluid filled blisters around the mouth caused by herpes simplex virus.  The most common strain of the virus causing cold sores is herpes simplex virus 1.  It can be spread by skin contact, sharing eating utensils, or even sharing towels.  Cold sores are contagious to other people until dry. (Approximately 5-7 days).  Wash your hands. You can spread the virus to your eyes through handling your contact lenses after touching the lesions.  Most people experience pain at the sight or tingling sensations in their lips that may begin before the ulcers erupt.  Herpes simplex is treatable but not curable.  It may lie dormant for a long time and then reappear due to stress or prolonged sun exposure.  Many patients have success in treating their cold sores with an over the counter topical called Abreva.  You may apply the cream up to 5 times daily (maximum 10 days) until healing occurs.  If you would like to use an oral antiviral medication to speed the healing of your cold sore, I have sent a prescription to your local pharmacy Valacyclovir 2 gm take one by mouth twice a day for 1 day    HOME CARE:  Wash your hands frequently. Do not pick at or rub the sore. Don't open the blisters. Avoid kissing other people during this time. Avoid sharing drinking glasses, eating utensils, or razors. Do not handle contact lenses unless you have thoroughly washed your hands with soap and warm water! Avoid oral sex during this time.  Herpes from sores on your mouth can spread to your partner's genital area. Avoid contact with anyone who has eczema or a weakened immune system. Cold sores are often triggered by exposure to intense sunlight, use a lip balm containing a sunscreen (SPF 30 or higher).  GET HELP RIGHT AWAY  IF:  Blisters look infected. Blisters occur near or in the eye. Symptoms last longer than 10 days. Your symptoms become worse.  MAKE SURE YOU:  Understand these instructions. Will watch your condition. Will get help right away if you are not doing well or get worse.    Your e-visit answers were reviewed by a board certified advanced clinical practitioner to complete your personal care plan.  Depending upon the condition, your plan could have  Included both over the counter or prescription medications.    Please review your pharmacy choice.  Be sure that the pharmacy you have chosen is open so that you can pick up your prescription now.  If there is a problem you can message your provider in MyChart to have the prescription routed to another pharmacy.    Your safety is important to us.  If you have drug allergies check our prescription carefully.  For the next 24 hours you can use MyChart to ask questions about today's visit, request a non-urgent call back, or ask for a work or school excuse from your e-visit provider.  You will get an email in the next two days asking about your experience.  I hope that your e-visit has been valuable and will speed your recovery.  I have spent 5 minutes in review of e-visit questionnaire, review and updating patient chart, medical decision making and response to patient.     Remell Giaimo M Ziyon [place], PA-C  

## 2022-07-21 ENCOUNTER — Other Ambulatory Visit (HOSPITAL_COMMUNITY): Payer: Self-pay

## 2022-07-22 ENCOUNTER — Other Ambulatory Visit (HOSPITAL_COMMUNITY): Payer: Self-pay

## 2022-07-24 ENCOUNTER — Encounter (HOSPITAL_BASED_OUTPATIENT_CLINIC_OR_DEPARTMENT_OTHER): Payer: Self-pay | Admitting: Emergency Medicine

## 2022-07-24 ENCOUNTER — Other Ambulatory Visit: Payer: Self-pay

## 2022-07-24 ENCOUNTER — Ambulatory Visit (INDEPENDENT_AMBULATORY_CARE_PROVIDER_SITE_OTHER): Payer: 59

## 2022-07-24 ENCOUNTER — Ambulatory Visit
Admission: EM | Admit: 2022-07-24 | Discharge: 2022-07-24 | Disposition: A | Discharge status: Home / Self Care | Payer: 59 | Attending: Urgent Care | Admitting: Urgent Care

## 2022-07-24 ENCOUNTER — Encounter: Payer: Self-pay | Admitting: Emergency Medicine

## 2022-07-24 DIAGNOSIS — R0602 Shortness of breath: Secondary | ICD-10-CM

## 2022-07-24 DIAGNOSIS — I493 Ventricular premature depolarization: Secondary | ICD-10-CM | POA: Insufficient documentation

## 2022-07-24 DIAGNOSIS — F419 Anxiety disorder, unspecified: Secondary | ICD-10-CM

## 2022-07-24 DIAGNOSIS — Z91040 Latex allergy status: Secondary | ICD-10-CM | POA: Diagnosis not present

## 2022-07-24 DIAGNOSIS — Z794 Long term (current) use of insulin: Secondary | ICD-10-CM | POA: Insufficient documentation

## 2022-07-24 DIAGNOSIS — R002 Palpitations: Secondary | ICD-10-CM | POA: Diagnosis not present

## 2022-07-24 LAB — BASIC METABOLIC PANEL
Anion gap: 8 (ref 5–15)
BUN: 15 mg/dL (ref 6–20)
CO2: 27 mmol/L (ref 22–32)
Calcium: 9.5 mg/dL (ref 8.9–10.3)
Chloride: 101 mmol/L (ref 98–111)
Creatinine, Ser: 0.98 mg/dL (ref 0.44–1.00)
GFR, Estimated: >60 mL/min (ref >60)
Glucose, Bld: 113 mg/dL — ABNORMAL HIGH (ref 70–99)
Potassium: 4 mmol/L (ref 3.5–5.1)
Sodium: 136 mmol/L (ref 135–145)

## 2022-07-24 LAB — CBC
HCT: 37.1 % (ref 36.0–46.0)
Hemoglobin: 12 g/dL (ref 12.0–15.0)
MCH: 27.8 pg (ref 26.0–34.0)
MCHC: 32.3 g/dL (ref 30.0–36.0)
MCV: 86.1 fL (ref 80.0–100.0)
Platelets: 324 10*3/uL (ref 150–400)
RBC: 4.31 MIL/uL (ref 3.87–5.11)
RDW: 15 % (ref 11.5–15.5)
WBC: 8.5 10*3/uL (ref 4.0–10.5)
nRBC: 0 % (ref 0.0–0.2)

## 2022-07-24 LAB — TROPONIN I (HIGH SENSITIVITY): Troponin I (High Sensitivity): <2 ng/L (ref <18)

## 2022-07-24 NOTE — ED Notes (Signed)
Pt called out for RN in lobby. States she feel anxious and nervous with CP worsening rn.

## 2022-07-24 NOTE — ED Provider Notes (Signed)
[facility] - URGENT [facility]ARE [facility]  Note:  This document was prepared using Systems analyst and may include unintentional dictation errors.  MRN: [id number] DOB: Jul 12, 1976  Subjective:   Caroline Gonzales is a 46 y.o. female presenting for 1 day history of acute onset persistent and worsening heart fluttering, chest discomfort and difficulty getting a full breath, shortness of breath.  Patient checked her Apple Watch and was advised that she was having PVCs.  She normally takes propranolol daily which is primarily used for anxiety.  She has not taken her dose today but she did take it last night.  Unfortunately in the past couple of hours the symptoms have returned and have worsened dramatically.  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  No fever, diaphoresis, neck or jaw pain, left arm pain, abdominal pain, vomiting.  No history of cardiac arrhythmias.  No history of heart disease.  She is not a diabetic patient.  Has a history of sleep apnea.  Has previously had good follow-up and regular checkups.  Blood work has always been fairly unremarkable.  No smoking, no drug use, no alcohol, no vaping.  Patient does not hydrate as much with plain water.  She does drink caffeine but tries to monitor it closely so as to not affect her anxiety.  No current facility-administered medications for this encounter.  Current Outpatient Medications:    Carboxymethylcellul-Glycerin (LUBRICATING EYE DROPS OP), Place 1 drop into both eyes daily as needed (dryness/ allergies)., Disp: , Rfl:    cetirizine (ZYRTEC) 10 MG tablet, Take 10 mg by mouth at bedtime., Disp: , Rfl:    Cholecalciferol (VITAMIN D3) 5000 units CAPS, Take 5,000 Units by mouth at bedtime., Disp: , Rfl:    clindamycin (CLEOCIN T) 1 % external solution, Apply 1 Application topically 2 (two) times daily as needed., Disp: 60 mL, Rfl: 3   clonazePAM (KLONOPIN) 0.5 MG  tablet, Take 1 tablet (0.5 mg total) by mouth every 6 (six) hours as needed for anxiety., Disp: 120 tablet, Rfl: 5   docusate sodium (COLACE) 100 MG capsule, Take 100 mg by mouth 2 (two) times daily., Disp: , Rfl:    esomeprazole (NEXIUM) 20 MG capsule, Take 20 mg by mouth at bedtime., Disp: , Rfl:    estradiol (ESTRACE) 0.1 MG/GM vaginal cream, Place a pea sized amount topically every day for one week, then change to twice weekly, Disp: 42.5 g, Rfl: 1   fluvoxaMINE (LUVOX) 100 MG tablet, Take 1 tablet by mouth at bedtime. Take with 50 mg dose to equal 150 mg at bedtime, Disp: 90 tablet, Rfl: 1   fluvoxaMINE (LUVOX) 50 MG tablet, TAKE 1 TABLET BY MOUTH AT BEDTIME. TAKE IN ADDITION TO THE 100 MG TABLET TO EQUAL 150 MG TOTAL, Disp: 90 tablet, Rfl: 1   gabapentin (NEURONTIN) 300 MG capsule, Take 2-3 capsules (600-900 mg total) by mouth at bedtime., Disp: 270 capsule, Rfl: 1   ondansetron (ZOFRAN-ODT) 8 MG disintegrating tablet, Dissolve 1 tablet by mouth every 8 hours as needed for nausea., Disp: 30 tablet, Rfl: 6   prazosin (MINIPRESS) 2 MG capsule, Take 4 capsules by mouth at bedtime., Disp: 360 capsule, Rfl: 1   prazosin (MINIPRESS) 5 MG capsule, Take 2 capsules (10 mg total) by mouth at bedtime., Disp: 60 capsule, Rfl: 0   propranolol (INDERAL) 40 MG tablet, Take 1 tablet (40 mg total) by mouth 2 (two)  times daily., Disp: 180 tablet, Rfl: 0   QUEtiapine (SEROQUEL) 200 MG tablet, Take 1/2-1 tablet (100-200 mg total) by mouth at bedtime., Disp: 90 tablet, Rfl: 1   QUEtiapine (SEROQUEL) 300 MG tablet, Take 1 tablet by mouth at bedtime., Disp: 90 tablet, Rfl: 1   Semaglutide-Weight Management (WEGOVY) 2.4 MG/0.75ML SOAJ, Inject 2.4 mg into the skin once a week., Disp: 9 mL, Rfl: 3   triamcinolone (NASACORT) 55 MCG/ACT AERO nasal inhaler, Place 1 spray into the nose at bedtime., Disp: , Rfl:    Allergies  Allergen Reactions   Penicillins Shortness Of Breath and Rash    Did it involve swelling of the  face/tongue/throat, SOB, or low BP? Yes Did it involve sudden or severe rash/hives, skin peeling, or any reaction on the inside of your mouth or nose? Yes Did you need to seek medical attention at a hospital or doctor's office? Yes When did it last happen?      childhood allergy If all above answers are "NO", may proceed with cephalosporin use.     Tamiflu [Oseltamivir Phosphate] Anaphylaxis   Cephalosporins Rash   Latex Rash    Past Medical History:  Diagnosis Date   Allergy    Zyrtec.   Anxiety    followed by Dr. Toy Cookey   Back pain    Complication of anesthesia    Depression    Fibroid    Gastroparesis 09/14/2012   gastric emptying study in 2014   GERD (gastroesophageal reflux disease)    Headache    Pneumonia    2013ish   PONV (postoperative nausea and vomiting)     likes scopolamine patch and zofran /phenergan helps   Pre-diabetes    PTSD (post-traumatic stress disorder)    PTSD (post-traumatic stress disorder)    Sleep apnea 07/01/2018   cpap optional, pt close to not use cpap   Vitamin D deficiency    Wears glasses    Wears glasses      Past Surgical History:  Procedure Laterality Date   ABDOMINAL HYSTERECTOMY  01/2021   ANKLE ARTHROSCOPY Left 2011   CESAREAN SECTION  06/2006   x 1   CHOLECYSTECTOMY  07/2006   laparoscopic   CYSTOSCOPY N/A 02/04/2021   Procedure: CYSTOSCOPY;  Surgeon: Salvadore Dom, MD;  Location: [facility];  Service: Gynecology;  Laterality: N/A;   DILATION AND CURETTAGE OF UTERUS  1997   x 2   FOREIGN BODY REMOVAL Left 11/18/2016   Procedure: REMOVAL FOREIGN BODY EXTREMITY LEFT FOOT;  Surgeon: Trula Slade, DPM;  Location: [facility];  Service: Podiatry;  Laterality: Left;   KNEE ARTHROSCOPY WITH MEDIAL MENISECTOMY Right 09/10/2021   Procedure: KNEE ARTHROSCOPY WITH MEDIAL MENISCAL ROOT REPAIR;  Surgeon: Hiram Gash, MD;  Location: [facility];  Service: Orthopedics;  Laterality: Right;   PILONIDAL CYST EXCISION   1990's   RADIOLOGY WITH ANESTHESIA N/A 11/10/2018   Procedure: MRI WITH ANESTHESIA OF BRAIN AND ORBITS WITH AND WITHOUT CONTRAST;  Surgeon: Radiologist, Medication, MD;  Location: [facility];  Service: Radiology;  Laterality: N/A;   TENDON REPAIR Left 2011   Left Ankle   TOTAL LAPAROSCOPIC HYSTERECTOMY WITH SALPINGECTOMY Bilateral 02/04/2021   Procedure: TOTAL LAPAROSCOPIC HYSTERECTOMY WITH SALPINGECTOMY;  Surgeon: Salvadore Dom, MD;  Location: [facility];  Service: Gynecology;  Laterality: Bilateral;   WISDOM TOOTH EXTRACTION  1990's    Family History  Problem Relation Age of Onset   Cancer Mother  squamous cell carcinoma   Hyperlipidemia Mother    Depression Mother    Anxiety disorder Mother    Obesity Mother    Heart disease Father        cardiomegaly, CHF; steroid use.   Hyperlipidemia Father    Hypertension Father    Mental retardation Father    High blood pressure Father    Depression Father    Anxiety disorder Father    Obesity Father    Rheum arthritis Sister    Multiple sclerosis Sister    Autoimmune disease Sister    Drug abuse Sister    Breast cancer Maternal Aunt    Thyroid cancer Paternal Aunt    Cancer Maternal Grandmother    Diabetes Maternal Grandmother    Heart disease Maternal Grandmother    Hyperlipidemia Maternal Grandmother    Hypertension Maternal Grandmother    Stroke Maternal Grandmother    Heart disease Paternal Grandmother    Hypertension Paternal Grandmother    Heart disease Paternal Grandfather    Hyperlipidemia Paternal Grandfather    Mental illness Paternal Grandfather     Social History   Tobacco Use   Smoking status: Former    Packs/day: 2.00    Years: 10.00    Total pack years: 20.00    Types: Cigarettes    Quit date: 09/14/2000    Years since quitting: 21.8   Smokeless tobacco: Never  Vaping Use   Vaping Use: Never used  Substance Use Topics   Alcohol use: Not Currently   Drug use: No    ROS   Objective:    Vitals: BP 138/86 (BP Location: Right Arm)   Pulse 79   Temp 97.9 F (36.6 C) (Oral)   Resp 16   LMP 01/06/2021   SpO2 99%   Physical Exam Constitutional:      General: She is not in acute distress.    Appearance: Normal appearance. She is well-developed. She is not ill-appearing, toxic-appearing or diaphoretic.  HENT:     Head: Normocephalic and atraumatic.     Nose: Nose normal.     Mouth/Throat:     Mouth: Mucous membranes are moist.  Eyes:     General: No scleral icterus.       Right eye: No discharge.        Left eye: No discharge.     Extraocular Movements: Extraocular movements intact.  Cardiovascular:     Rate and Rhythm: Normal rate and regular rhythm.     Heart sounds: Normal heart sounds. No murmur heard.    No friction rub. No gallop.  Pulmonary:     Effort: Pulmonary effort is normal. No respiratory distress.     Breath sounds: No stridor. No wheezing, rhonchi or rales.  Chest:     Chest wall: No tenderness.  Skin:    General: Skin is warm and dry.  Neurological:     General: No focal deficit present.     Mental Status: She is alert and oriented to person, place, and time.  Psychiatric:        Mood and Affect: Mood normal.        Behavior: Behavior normal.     ED ECG REPORT   Date: 07/24/2022  EKG Time: 7:14 PM  Rate: 90bpm  Rhythm: normal sinus rhythm and premature ventricular contractions (PVC),  normal sinus rhythm  Axis: normal  Intervals:none  ST&T Change: T wave flattening in lead III  Narrative Interpretation: Sinus rhythm at 90 bpm with premature  ventricular contractions and nonspecific T wave changes above.  Very comparable to previous EKG.  DG Chest 2 View  Result Date: 07/24/2022 CLINICAL DATA:  Shortness of breath EXAM: CHEST - 2 VIEW COMPARISON:  11/11/2021 FINDINGS: The heart size and mediastinal contours are within normal limits. Both lungs are clear. The visualized skeletal structures are unremarkable. IMPRESSION: No active  cardiopulmonary disease. Electronically Signed   By: Donavan Foil M.D.   On: 07/24/2022 18:33     Assessment and Plan :   PDMP not reviewed this encounter.  1. Premature ventricular contractions   2. Shortness of breath   3. Anxiety     Chest x-ray negative.  We will pursue basic blood work.  Recommended she push fluids tonight and avoid any caffeinated products for the weekend.  I advised that she increase her propranolol dosing to twice daily.  I placed a referral to cardiology as she may not have easy access to her PCP since she is changing practices. Counseled patient on potential for adverse effects with medications prescribed/recommended today, maintain strict ER and return-to-clinic precautions as discussed, patient verbalized understanding.    Jaynee Eagles, [place] 07/24/22 [id number]

## 2022-07-24 NOTE — ED Notes (Signed)
RT out to waiting area for increased SOB and chest pain. RT assessed pt and pulled to triage. Respiratory status stable w/no distress noted, pt mildly tachypneic at this time. Charge RN notified of pt condition change.

## 2022-07-24 NOTE — ED Triage Notes (Addendum)
States around an hour ago she started feeling a fluttering sensation in her chest, which makes he feel like she can't get a deep breath. Reports nausea. Denies fever, chest pain, hx of same. Hx of anxiety, states this does not feel like a panic attack

## 2022-07-24 NOTE — ED Triage Notes (Signed)
"  I just dont feel right" chest pain sob Seen at [facility] today. Increase PVC Took propanolol '40mg'$  in office (took evening dose early, normally take for anxiety) Cxr done at [facility]

## 2022-07-25 ENCOUNTER — Emergency Department (HOSPITAL_BASED_OUTPATIENT_CLINIC_OR_DEPARTMENT_OTHER)
Admission: EM | Admit: 2022-07-25 | Discharge: 2022-07-25 | Disposition: A | Discharge status: Home / Self Care | Payer: 59 | Attending: Emergency Medicine | Admitting: Emergency Medicine

## 2022-07-25 DIAGNOSIS — Z91040 Latex allergy status: Secondary | ICD-10-CM | POA: Diagnosis not present

## 2022-07-25 DIAGNOSIS — I493 Ventricular premature depolarization: Secondary | ICD-10-CM

## 2022-07-25 DIAGNOSIS — R002 Palpitations: Secondary | ICD-10-CM

## 2022-07-25 DIAGNOSIS — Z794 Long term (current) use of insulin: Secondary | ICD-10-CM | POA: Diagnosis not present

## 2022-07-25 LAB — COMPREHENSIVE METABOLIC PANEL
ALT: 21 IU/L (ref 0–32)
AST: 21 IU/L (ref 0–40)
Albumin/Globulin Ratio: 1.1 — ABNORMAL LOW (ref 1.2–2.2)
Albumin: 4 g/dL (ref 3.9–4.9)
Alkaline Phosphatase: 125 IU/L — ABNORMAL HIGH (ref 44–121)
BUN/Creatinine Ratio: 13 (ref 9–23)
BUN: 13 mg/dL (ref 6–24)
Bilirubin Total: <0.2 mg/dL (ref 0.0–1.2)
CO2: 23 mmol/L (ref 20–29)
Calcium: 9.4 mg/dL (ref 8.7–10.2)
Chloride: 102 mmol/L (ref 96–106)
Creatinine, Ser: 0.97 mg/dL (ref 0.57–1.00)
Globulin, Total: 3.6 g/dL (ref 1.5–4.5)
Glucose: 119 mg/dL — ABNORMAL HIGH (ref 70–99)
Potassium: 4.2 mmol/L (ref 3.5–5.2)
Sodium: 139 mmol/L (ref 134–144)
Total Protein: 7.6 g/dL (ref 6.0–8.5)
eGFR: 73 mL/min/{1.73_m2} (ref >59)

## 2022-07-25 LAB — MAGNESIUM: Magnesium: 2 mg/dL (ref 1.7–2.4)

## 2022-07-25 LAB — TSH: TSH: 1.34 u[IU]/mL (ref 0.450–4.500)

## 2022-07-25 LAB — TROPONIN I (HIGH SENSITIVITY): Troponin I (High Sensitivity): 2 ng/L (ref <18)

## 2022-07-25 MED ORDER — SODIUM CHLORIDE 0.9 % IV BOLUS
1000.0000 mL | Freq: Once | INTRAVENOUS | Status: AC
  Administered 2022-07-25: 1000 mL via INTRAVENOUS

## 2022-07-25 NOTE — ED Provider Notes (Signed)
[facility] EMERGENCY DEPT Provider Note   CSN: [id number] Arrival date & time: 07/24/22  1945     History  Chief Complaint  Patient presents with   Chest Pain    Caroline Gonzales is a 46 y.o. female.  HPI     This is a 46 year old female who presents with palpitations and lightheadedness.  Patient reports that she was at work and she began to feel "weird."  She had some palpitations and noted that her heart seemed to be skipping beats.  She had some associated shortness of breath and lightheadedness.  Reports some chest discomfort.  She states that she takes propranolol nightly for anxiety and PTSD.  She did not feel that her symptoms were consistent with an anxiety attack.  She was seen in urgent care.  She took her propranolol early but had sustained symptoms that was encouraged to come to the emergency department.  Denies new medications, increases in caffeine, stimulant use.  Home Medications Prior to Admission medications   Medication Sig Start Date End Date Taking? Authorizing Provider  Carboxymethylcellul-Glycerin (LUBRICATING EYE DROPS OP) Place 1 drop into both eyes daily as needed (dryness/ allergies).    [provider]  cetirizine (ZYRTEC) 10 MG tablet Take 10 mg by mouth at bedtime.    [provider]  Cholecalciferol (VITAMIN D3) 5000 units CAPS Take 5,000 Units by mouth at bedtime.    [provider]  clindamycin (CLEOCIN T) 1 % external solution Apply 1 Application topically 2 (two) times daily as needed. 07/13/22     clonazePAM (KLONOPIN) 0.5 MG tablet Take 1 tablet (0.5 mg total) by mouth every 6 (six) hours as needed for anxiety. 05/22/22   Donnal Moat T, PA-C  docusate sodium (COLACE) 100 MG capsule Take 100 mg by mouth 2 (two) times daily.    [provider]  esomeprazole (NEXIUM) 20 MG capsule Take 20 mg by mouth at bedtime.    [provider]  estradiol (ESTRACE) 0.1 MG/GM vaginal cream Place a pea  sized amount topically every day for one week, then change to twice weekly 05/07/22   Salvadore Dom, MD  fluvoxaMINE (LUVOX) 100 MG tablet Take 1 tablet by mouth at bedtime. Take with 50 mg dose to equal 150 mg at bedtime 03/23/22   Donnal Moat T, PA-C  fluvoxaMINE (LUVOX) 50 MG tablet TAKE 1 TABLET BY MOUTH AT BEDTIME. TAKE IN ADDITION TO THE 100 MG TABLET TO EQUAL 150 MG TOTAL 03/23/22   Hurst, Helene Kelp T, PA-C  gabapentin (NEURONTIN) 300 MG capsule Take 2-3 capsules (600-900 mg total) by mouth at bedtime. 03/23/22   Donnal Moat T, PA-C  ondansetron (ZOFRAN-ODT) 8 MG disintegrating tablet Dissolve 1 tablet by mouth every 8 hours as needed for nausea. 12/29/21   [name], NP  prazosin (MINIPRESS) 2 MG capsule Take 4 capsules by mouth at bedtime. 01/29/22   Donnal Moat T, PA-C  prazosin (MINIPRESS) 5 MG capsule Take 2 capsules (10 mg total) by mouth at bedtime. 07/15/22   Addison Lank, PA-C  propranolol (INDERAL) 40 MG tablet Take 1 tablet (40 mg total) by mouth 2 (two) times daily. 05/22/22   Donnal Moat T, PA-C  QUEtiapine (SEROQUEL) 200 MG tablet Take 1/2-1 tablet (100-200 mg total) by mouth at bedtime. 03/23/22   Donnal Moat T, PA-C  QUEtiapine (SEROQUEL) 300 MG tablet Take 1 tablet by mouth at bedtime. 03/23/22   Addison Lank, PA-C  Semaglutide-Weight Management (WEGOVY) 2.4 MG/0.75ML Darden Palmer  Inject 2.4 mg into the skin once a week. 03/26/22   [name], NP  triamcinolone (NASACORT) 55 MCG/ACT AERO nasal inhaler Place 1 spray into the nose at bedtime.    [provider]      Allergies    Penicillins, Tamiflu [oseltamivir phosphate], Cephalosporins, and Latex    Review of Systems   Review of Systems  Respiratory:  Positive for chest tightness and shortness of breath.   Cardiovascular:  Positive for palpitations. Negative for chest pain.  All other systems reviewed and are negative.   Physical Exam Updated Vital Signs BP 117/62   Pulse 78   Temp 98.3 F (36.8  C) (Oral)   Resp 19   Ht 1.651 m ('5\' 5"'$ )   Wt 117.9 kg   LMP 01/06/2021   SpO2 99%   BMI 43.27 kg/m  Physical Exam Vitals and nursing note reviewed.  Constitutional:      Appearance: She is well-developed. She is obese. She is not ill-appearing.  HENT:     Head: Normocephalic and atraumatic.  Eyes:     Pupils: Pupils are equal, round, and reactive to light.  Cardiovascular:     Rate and Rhythm: Normal rate and regular rhythm.     Heart sounds: Normal heart sounds.  Pulmonary:     Effort: Pulmonary effort is normal. No respiratory distress.     Breath sounds: No wheezing.  Abdominal:     General: Bowel sounds are normal.     Palpations: Abdomen is soft.  Musculoskeletal:     Cervical back: Neck supple.  Skin:    General: Skin is warm and dry.  Neurological:     Mental Status: She is alert and oriented to person, place, and time.  Psychiatric:        Mood and Affect: Mood normal.     ED Results / Procedures / Treatments   Labs (all labs ordered are listed, but only abnormal results are displayed) Labs Reviewed  BASIC METABOLIC PANEL - Abnormal; Notable for the following components:      Result Value   Glucose, Bld 113 (*)    All other components within normal limits  CBC  MAGNESIUM  TROPONIN I (HIGH SENSITIVITY)  TROPONIN I (HIGH SENSITIVITY)    EKG EKG Interpretation  Date/Time:  Friday July 24 2022 21:39:53 EST Ventricular Rate:  85 PR Interval:  154 QRS Duration: 84 QT Interval:  354 QTC Calculation: 421 R Axis:   72 Text Interpretation: Sinus rhythm with frequent Premature ventricular complexes with junctional escape complexes Otherwise normal ECG When compared with ECG of 24-Jul-2022 19:51, Sinus rhythm is now with junctional escape complexes Confirmed by Thayer Jew [phone]) on 07/25/2022 1:27:07 AM  Radiology DG Chest 2 View  Result Date: 07/24/2022 CLINICAL DATA:  Shortness of breath EXAM: CHEST - 2 VIEW COMPARISON:  11/11/2021  FINDINGS: The heart size and mediastinal contours are within normal limits. Both lungs are clear. The visualized skeletal structures are unremarkable. IMPRESSION: No active cardiopulmonary disease. Electronically Signed   By: Donavan Foil M.D.   On: 07/24/2022 18:33    Procedures Procedures    Medications Ordered in ED Medications  sodium chloride 0.9 % bolus 1,000 mL ( Intravenous Stopped 07/25/22 0302)    ED Course/ Medical Decision Making/ A&P                           Medical Decision Making Amount and/or Complexity of Data Reviewed  Labs: ordered.   This patient presents to the ED for concern of palpitations, chest pain, this involves an extensive number of treatment options, and is a complaint that carries with it a high risk of complications and morbidity.  I considered the following differential and admission for this acute, potentially life threatening condition.  The differential diagnosis includes ACS, arrhythmia, metabolic derangement  MDM:    This is a 46 year old female who presents with palpitations.  Reports associated chest pain, shortness of breath, lightheadedness.  She is nontoxic and vital signs are reassuring.  EKG shows frequent PVCs.  She is apparently symptomatic.  She did take her propranolol tonight.  By the time I evaluated the patient, her rate was normal and she did not have frequent PVCs at that time.  She did have some improvement of her symptoms but reported persistent lightheadedness.  EKG showed no evidence of arrhythmia.  Lab work including metabolic panel is reassuring.  Magnesium is normal.  Troponin x2 is negative.  Doubt primary ACS.  Chest x-ray reviewed from urgent care and reassuring.  Patient had improvement after fluid resuscitation.  Recommend cardiology follow-up for Holter monitoring.  Continue propranolol.  (Labs, imaging, consults)  Labs: I Ordered, and personally interpreted labs.  The pertinent results include: CBC, BMP, magnesium,  troponin  Imaging Studies ordered: I ordered imaging studies including outside chest x-ray I independently visualized and interpreted imaging. I agree with the radiologist interpretation  Additional history obtained from chart review.  External records from outside source obtained and reviewed including urgent care note  Cardiac Monitoring: The patient was maintained on a cardiac monitor.  I personally viewed and interpreted the cardiac monitored which showed an underlying rhythm of: Sinus rhythm with PVC  Reevaluation: After the interventions noted above, I reevaluated the patient and found that they have :improved  Social Determinants of Health:  lives independently  Disposition: Discharge  Co morbidities that complicate the patient evaluation  Past Medical History:  Diagnosis Date   Allergy    Zyrtec.   Anxiety    followed by Dr. Toy Cookey   Back pain    Complication of anesthesia    Depression    Fibroid    Gastroparesis 09/14/2012   gastric emptying study in 2014   GERD (gastroesophageal reflux disease)    Headache    Pneumonia    2013ish   PONV (postoperative nausea and vomiting)     likes scopolamine patch and zofran /phenergan helps   Pre-diabetes    PTSD (post-traumatic stress disorder)    PTSD (post-traumatic stress disorder)    Sleep apnea 07/01/2018   cpap optional, pt close to not use cpap   Vitamin D deficiency    Wears glasses    Wears glasses      Medicines Meds ordered this encounter  Medications   sodium chloride 0.9 % bolus 1,000 mL    I have reviewed the patients home medicines and have made adjustments as needed  Problem List / ED Course: Problem List Items Addressed This Visit   None Visit Diagnoses     Palpitations    -  Primary   Relevant Orders   Ambulatory referral to Cardiology   PVC (premature ventricular contraction)       Relevant Orders   Ambulatory referral to Cardiology                   Final Clinical  Impression(s) / ED Diagnoses Final diagnoses:  Palpitations  PVC (premature ventricular contraction)  Rx / DC Orders ED Discharge Orders          Ordered    Ambulatory referral to Cardiology        07/25/22 0347              Merryl Hacker, MD 07/25/22 [phone]

## 2022-07-25 NOTE — Discharge Instructions (Signed)
You were seen today for palpitations.  You have evidence of PVCs.  Continue your propranolol.  Follow-up closely with cardiology for Holter monitoring.

## 2022-07-27 ENCOUNTER — Other Ambulatory Visit (HOSPITAL_COMMUNITY): Payer: Self-pay

## 2022-07-28 DIAGNOSIS — F4310 Post-traumatic stress disorder, unspecified: Secondary | ICD-10-CM | POA: Diagnosis not present

## 2022-07-29 ENCOUNTER — Other Ambulatory Visit (HOSPITAL_COMMUNITY): Payer: Self-pay

## 2022-08-05 ENCOUNTER — Encounter: Payer: Self-pay | Admitting: Internal Medicine

## 2022-08-05 ENCOUNTER — Ambulatory Visit: Payer: 59 | Attending: Internal Medicine | Admitting: Internal Medicine

## 2022-08-05 ENCOUNTER — Ambulatory Visit (INDEPENDENT_AMBULATORY_CARE_PROVIDER_SITE_OTHER): Payer: 59

## 2022-08-05 VITALS — BP 116/70 | HR 88 | Ht 65.0 in | Wt 261.0 lb

## 2022-08-05 DIAGNOSIS — I493 Ventricular premature depolarization: Secondary | ICD-10-CM

## 2022-08-05 NOTE — Patient Instructions (Signed)
Medication Instructions:  Continue same medications   Lab Work: None ordered   Testing/Procedures: Echo  3 day Heart Monitor  ( Zio)    Follow-Up: At [facility] you and your health needs are our priority.  As part of our continuing mission to provide you with exceptional heart care, we have created designated Provider Care Teams.  These Care Teams include your primary Cardiologist (physician) and Advanced Practice Providers (APPs -  Physician Assistants and Nurse Practitioners) who all work together to provide you with the care you need, when you need it.  We recommend signing up for the patient portal called "MyChart".  Sign up information is provided on this After Visit Summary.  MyChart is used to connect with patients for Virtual Visits (Telemedicine).  Patients are able to view lab/test results, encounter notes, upcoming appointments, etc.  Non-urgent messages can be sent to your provider as well.   To learn more about what you can do with MyChart, go to [email or web address].    Your next appointment:  6 months    Call in Feb to schedule May appointment     The format for your next appointment: Office    Provider: [place]

## 2022-08-05 NOTE — Progress Notes (Unsigned)
Enrolled for [employer] to mail a ZIO XT long term holter monitor to the patients address on file.  

## 2022-08-05 NOTE — Progress Notes (Signed)
Cardiology Office Note:    Date:  08/05/2022   ID:  Caroline Gonzales, DOB 02-11-1976, MRN [id number]  PCP:  [name], NP   [place] Providers Cardiologist:  None     Referring MD: Jaynee Eagles, PA-C   No chief complaint on file. PVCs  History of Present Illness:    Caroline Gonzales is a 46 y.o. female with a hx of anxiety, PTSD referral from the ED for palpitations. Found to have PVCs. She was told to increase her propanolol dose. ECG today does not show PVCs.    07/24/2022 TSH is normal  Hgb 12 mg/dL   Past Medical History:  Diagnosis Date   Allergy    Zyrtec.   Anxiety    followed by Dr. Toy Cookey   Back pain    Complication of anesthesia    Depression    Fibroid    Gastroparesis 09/14/2012   gastric emptying study in 2014   GERD (gastroesophageal reflux disease)    Headache    Pneumonia    2013ish   PONV (postoperative nausea and vomiting)     likes scopolamine patch and zofran /phenergan helps   Pre-diabetes    PTSD (post-traumatic stress disorder)    PTSD (post-traumatic stress disorder)    Sleep apnea 07/01/2018   cpap optional, pt close to not use cpap   Vitamin D deficiency    Wears glasses    Wears glasses     Past Surgical History:  Procedure Laterality Date   ABDOMINAL HYSTERECTOMY  01/2021   ANKLE ARTHROSCOPY Left 2011   CESAREAN SECTION  06/2006   x 1   CHOLECYSTECTOMY  07/2006   laparoscopic   CYSTOSCOPY N/A 02/04/2021   Procedure: CYSTOSCOPY;  Surgeon: Salvadore Dom, MD;  Location: [place];  Service: Gynecology;  Laterality: N/A;   DILATION AND CURETTAGE OF UTERUS  1997   x 2   FOREIGN BODY REMOVAL Left 11/18/2016   Procedure: REMOVAL FOREIGN BODY EXTREMITY LEFT FOOT;  Surgeon: Trula Slade, DPM;  Location: [place];  Service: Podiatry;  Laterality: Left;   KNEE ARTHROSCOPY WITH MEDIAL MENISECTOMY Right 09/10/2021   Procedure: KNEE ARTHROSCOPY WITH MEDIAL MENISCAL ROOT REPAIR;  Surgeon: Hiram Gash, MD;   Location: [place];  Service: Orthopedics;  Laterality: Right;   PILONIDAL CYST EXCISION  1990's   RADIOLOGY WITH ANESTHESIA N/A 11/10/2018   Procedure: MRI WITH ANESTHESIA OF BRAIN AND ORBITS WITH AND WITHOUT CONTRAST;  Surgeon: Radiologist, Medication, MD;  Location: [place];  Service: Radiology;  Laterality: N/A;   TENDON REPAIR Left 2011   Left Ankle   TOTAL LAPAROSCOPIC HYSTERECTOMY WITH SALPINGECTOMY Bilateral 02/04/2021   Procedure: TOTAL LAPAROSCOPIC HYSTERECTOMY WITH SALPINGECTOMY;  Surgeon: Salvadore Dom, MD;  Location: [place];  Service: Gynecology;  Laterality: Bilateral;   WISDOM TOOTH EXTRACTION  1990's    Current Medications: Current Meds  Medication Sig   Carboxymethylcellul-Glycerin (LUBRICATING EYE DROPS OP) Place 1 drop into both eyes daily as needed (dryness/ allergies).   cetirizine (ZYRTEC) 10 MG tablet Take 10 mg by mouth at bedtime.   Cholecalciferol (VITAMIN D3) 5000 units CAPS Take 5,000 Units by mouth at bedtime.   clindamycin (CLEOCIN T) 1 % external solution Apply 1 Application topically 2 (two) times daily as needed.   clonazePAM (KLONOPIN) 0.5 MG tablet Take 1 tablet (0.5 mg total) by mouth every 6 (six) hours as needed for anxiety.   docusate sodium (COLACE) 100 MG capsule Take 100  mg by mouth 2 (two) times daily.   esomeprazole (NEXIUM) 20 MG capsule Take 20 mg by mouth at bedtime.   fluvoxaMINE (LUVOX) 100 MG tablet Take 1 tablet by mouth at bedtime. Take with 50 mg dose to equal 150 mg at bedtime   fluvoxaMINE (LUVOX) 50 MG tablet TAKE 1 TABLET BY MOUTH AT BEDTIME. TAKE IN ADDITION TO THE 100 MG TABLET TO EQUAL 150 MG TOTAL   gabapentin (NEURONTIN) 300 MG capsule Take 2-3 capsules (600-900 mg total) by mouth at bedtime.   ondansetron (ZOFRAN-ODT) 8 MG disintegrating tablet Dissolve 1 tablet by mouth every 8 hours as needed for nausea.   prazosin (MINIPRESS) 2 MG capsule Take 4 capsules by mouth at bedtime.   prazosin (MINIPRESS) 5 MG  capsule Take 2 capsules (10 mg total) by mouth at bedtime.   propranolol (INDERAL) 40 MG tablet Take 1 tablet (40 mg total) by mouth 2 (two) times daily.   QUEtiapine (SEROQUEL) 200 MG tablet Take 1/2-1 tablet (100-200 mg total) by mouth at bedtime.   QUEtiapine (SEROQUEL) 300 MG tablet Take 1 tablet by mouth at bedtime.   Semaglutide-Weight Management (WEGOVY) 2.4 MG/0.75ML SOAJ Inject 2.4 mg into the skin once a week.   triamcinolone (NASACORT) 55 MCG/ACT AERO nasal inhaler Place 1 spray into the nose at bedtime.     Allergies:   Penicillins, Tamiflu [oseltamivir phosphate], Cephalosporins, and Latex   Social History   Socioeconomic History   Marital status: Married    Spouse name: Not on file   Number of children: 2   Years of education: Not on file   Highest education level: Bachelor's degree (e.g., BA, AB, BS)  Occupational History   Occupation: Programmer, multimedia: [employer]  Tobacco Use   Smoking status: Former    Packs/day: 2.00    Years: 10.00    Total pack years: 20.00    Types: Cigarettes    Quit date: 09/14/2000    Years since quitting: 21.9   Smokeless tobacco: Never  Vaping Use   Vaping Use: Never used  Substance and Sexual Activity   Alcohol use: Not Currently   Drug use: No   Sexual activity: Yes    Partners: Male    Birth control/protection: Other-see comments    Comment: Post-hysterectomy  Other Topics Concern   Not on file  Social History Narrative   Marital status:  Married in 02/2015      Children: 1 son (9); 1 stepdaughter (8)      Lives: with husband, son, stepdaughter joint      Employment: RN at [employer]; 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      Seatbelt: 100%; no texting while driving often       Social Determinants of Radio broadcast assistant Strain: Not on file  Food Insecurity: Not on file  Transportation  Needs: Not on file  Physical Activity: Not on file  Stress: Not on file  Social Connections: Not on file     Family History: The patient's family history includes Anxiety disorder in her father and mother; Autoimmune disease in her sister; Breast cancer in her maternal aunt; Cancer in her maternal grandmother and mother; Depression in her father and mother; Diabetes in her maternal grandmother; Drug abuse in her sister; Heart  disease in her father, maternal grandmother, paternal grandfather, and paternal grandmother; High blood pressure in her father; Hyperlipidemia in her father, maternal grandmother, mother, and paternal grandfather; Hypertension in her father, maternal grandmother, and paternal grandmother; Mental illness in her paternal grandfather; Mental retardation in her father; Multiple sclerosis in her sister; Obesity in her father and mother; Rheum arthritis in her sister; Stroke in her maternal grandmother; Thyroid cancer in her paternal aunt.  Father- cardiomyopathy; drug use Mother- no issues  ROS:   Please see the history of present illness.     All other systems reviewed and are negative.  EKGs/Labs/Other Studies Reviewed:    The following studies were reviewed today:   EKG:  EKG is  ordered today.  The ekg ordered today demonstrates   08/04/2022- NSR, Qtc 454 ms  Recent Labs: 07/24/2022: ALT 21; BUN 15; Creatinine, Ser 0.98; Hemoglobin 12.0; Platelets 324; Potassium 4.0; Sodium 136; TSH 1.340 07/25/2022: Magnesium 2.0   Recent Lipid Panel    Component Value Date/Time   CHOL 146 12/29/2021 1057   TRIG 279 (H) 12/29/2021 1057   HDL 35 (L) 12/29/2021 1057   CHOLHDL 4.2 12/29/2021 1057   CHOLHDL 3.5 01/20/2021 1033   VLDL 37 (H) 12/01/2015 0914   LDLCALC 66 12/29/2021 1057   LDLCALC 77 01/20/2021 1033     Risk Assessment/Calculations:         Physical Exam:    VS:  Vitals:   08/05/22 0841  BP: 116/70  Pulse: 88  SpO2: 98%     BP 116/70   Pulse 88    Ht '5\' 5"'$  (1.651 m)   Wt 261 lb (118.4 kg)   LMP 01/06/2021   SpO2 98%   BMI 43.43 kg/m     Wt Readings from Last 3 Encounters:  08/05/22 261 lb (118.4 kg)  07/24/22 260 lb (117.9 kg)  07/02/22 261 lb 8 oz (118.6 kg)     GEN:  Well nourished, well developed in no acute distress HEENT: Normal NECK: No JVD; No carotid bruits LYMPHATICS: No lymphadenopathy CARDIAC: RRR, no murmurs, rubs, gallops RESPIRATORY:  Clear to auscultation without rales, wheezing or rhonchi  ABDOMEN: Soft, non-tender, non-distended MUSCULOSKELETAL:  No edema; No deformity  SKIN: Warm and dry NEUROLOGIC:  Alert and oriented x 3 PSYCHIATRIC:  Normal affect   ASSESSMENT:    PVCs: will get zio and TTE. She is on propanolol 40 mg BID. PVCs have improved. If no structural heart disease and PVC burden is low, she can continue her propanolol. She has low risk and no signs of ischemia. PLAN:    In order of problems listed above:  TTE 3 day ziopatch Follow up 6 months           Medication Adjustments/Labs and Tests Ordered: Current medicines are reviewed at length with the patient today.  Concerns regarding medicines are outlined above.  Orders Placed This Encounter  Procedures   LONG TERM MONITOR (3-14 DAYS)   EKG 12-Lead   ECHOCARDIOGRAM COMPLETE   No orders of the defined types were placed in this encounter.   Patient Instructions  Medication Instructions:  Continue same medications   Lab Work: None ordered   Testing/Procedures: Echo  3 day Heart Monitor  ( Zio)    Follow-Up: At [facility], you and your health needs are our priority.  As part of our continuing mission to provide you with exceptional heart care, we have created designated Provider Care Teams.  These Care Teams include your  primary Cardiologist (physician) and Advanced Practice Providers (APPs -  Physician Assistants and Nurse Practitioners) who all work together to provide you with the care you need, when  you need it.  We recommend signing up for the patient portal called "MyChart".  Sign up information is provided on this After Visit Summary.  MyChart is used to connect with patients for Virtual Visits (Telemedicine).  Patients are able to view lab/test results, encounter notes, upcoming appointments, etc.  Non-urgent messages can be sent to your provider as well.   To learn more about what you can do with MyChart, go to [email or web address].    Your next appointment:  6 months    Call in Feb to schedule May appointment     The format for your next appointment: Office    Provider: Dr.Maliya Marich    Important Information About Sugar         Signed, Janina Mayo, MD  08/05/2022 9:47 AM    [place]

## 2022-08-08 ENCOUNTER — Other Ambulatory Visit (HOSPITAL_COMMUNITY): Payer: Self-pay

## 2022-08-10 DIAGNOSIS — I493 Ventricular premature depolarization: Secondary | ICD-10-CM

## 2022-08-10 DIAGNOSIS — D2239 Melanocytic nevi of other parts of face: Secondary | ICD-10-CM | POA: Diagnosis not present

## 2022-08-10 DIAGNOSIS — L720 Epidermal cyst: Secondary | ICD-10-CM | POA: Diagnosis not present

## 2022-08-11 DIAGNOSIS — F4310 Post-traumatic stress disorder, unspecified: Secondary | ICD-10-CM | POA: Diagnosis not present

## 2022-08-13 ENCOUNTER — Other Ambulatory Visit: Payer: Self-pay | Admitting: Physician Assistant

## 2022-08-13 ENCOUNTER — Other Ambulatory Visit (HOSPITAL_COMMUNITY): Payer: Self-pay

## 2022-08-13 MED ORDER — PRAZOSIN HCL 5 MG PO CAPS
10.0000 mg | ORAL_CAPSULE | Freq: Every day | ORAL | 1 refills | Status: DC
  Filled 2022-08-13: qty 60, 30d supply, fill #0
  Filled 2022-09-21: qty 60, 30d supply, fill #1

## 2022-08-14 ENCOUNTER — Other Ambulatory Visit (HOSPITAL_COMMUNITY): Payer: Self-pay

## 2022-08-17 ENCOUNTER — Other Ambulatory Visit (HOSPITAL_COMMUNITY): Payer: Self-pay

## 2022-08-18 ENCOUNTER — Other Ambulatory Visit (HOSPITAL_COMMUNITY): Payer: Self-pay

## 2022-08-18 DIAGNOSIS — F4310 Post-traumatic stress disorder, unspecified: Secondary | ICD-10-CM | POA: Diagnosis not present

## 2022-08-22 DIAGNOSIS — I493 Ventricular premature depolarization: Secondary | ICD-10-CM | POA: Diagnosis not present

## 2022-08-24 ENCOUNTER — Other Ambulatory Visit: Payer: Self-pay | Admitting: Physician Assistant

## 2022-08-24 ENCOUNTER — Other Ambulatory Visit (HOSPITAL_COMMUNITY): Payer: Self-pay

## 2022-08-24 MED ORDER — PROPRANOLOL HCL 40 MG PO TABS
40.0000 mg | ORAL_TABLET | Freq: Two times a day (BID) | ORAL | 0 refills | Status: DC
  Filled 2022-08-24: qty 180, 90d supply, fill #0

## 2022-08-25 ENCOUNTER — Other Ambulatory Visit (HOSPITAL_COMMUNITY): Payer: Self-pay

## 2022-08-25 DIAGNOSIS — F4310 Post-traumatic stress disorder, unspecified: Secondary | ICD-10-CM | POA: Diagnosis not present

## 2022-08-28 ENCOUNTER — Ambulatory Visit (HOSPITAL_COMMUNITY): Payer: 59 | Attending: Cardiology

## 2022-08-28 DIAGNOSIS — I493 Ventricular premature depolarization: Secondary | ICD-10-CM | POA: Diagnosis not present

## 2022-08-28 DIAGNOSIS — R9431 Abnormal electrocardiogram [ECG] [EKG]: Secondary | ICD-10-CM

## 2022-08-28 LAB — ECHOCARDIOGRAM COMPLETE
Area-P 1/2: 4.39 cm2
S' Lateral: 2.7 cm

## 2022-09-01 ENCOUNTER — Encounter: Payer: Self-pay | Admitting: Internal Medicine

## 2022-09-01 DIAGNOSIS — F4310 Post-traumatic stress disorder, unspecified: Secondary | ICD-10-CM | POA: Diagnosis not present

## 2022-09-10 ENCOUNTER — Other Ambulatory Visit (HOSPITAL_COMMUNITY): Payer: Self-pay

## 2022-09-21 ENCOUNTER — Other Ambulatory Visit (HOSPITAL_COMMUNITY): Payer: Self-pay

## 2022-09-22 DIAGNOSIS — F4310 Post-traumatic stress disorder, unspecified: Secondary | ICD-10-CM | POA: Diagnosis not present

## 2022-09-23 ENCOUNTER — Other Ambulatory Visit (HOSPITAL_COMMUNITY): Payer: Self-pay

## 2022-09-23 ENCOUNTER — Ambulatory Visit (INDEPENDENT_AMBULATORY_CARE_PROVIDER_SITE_OTHER): Payer: 59 | Admitting: Physician Assistant

## 2022-09-23 ENCOUNTER — Other Ambulatory Visit: Payer: Self-pay

## 2022-09-23 ENCOUNTER — Encounter: Payer: Self-pay | Admitting: Physician Assistant

## 2022-09-23 ENCOUNTER — Encounter: Payer: Self-pay | Admitting: Internal Medicine

## 2022-09-23 DIAGNOSIS — F515 Nightmare disorder: Secondary | ICD-10-CM | POA: Diagnosis not present

## 2022-09-23 DIAGNOSIS — G4733 Obstructive sleep apnea (adult) (pediatric): Secondary | ICD-10-CM | POA: Diagnosis not present

## 2022-09-23 DIAGNOSIS — F3341 Major depressive disorder, recurrent, in partial remission: Secondary | ICD-10-CM

## 2022-09-23 DIAGNOSIS — F4310 Post-traumatic stress disorder, unspecified: Secondary | ICD-10-CM

## 2022-09-23 DIAGNOSIS — F411 Generalized anxiety disorder: Secondary | ICD-10-CM | POA: Diagnosis not present

## 2022-09-23 MED ORDER — FLUVOXAMINE MALEATE 50 MG PO TABS
50.0000 mg | ORAL_TABLET | Freq: Every day | ORAL | 3 refills | Status: DC
  Filled 2022-09-23 – 2022-11-19 (×2): qty 90, 90d supply, fill #0
  Filled 2023-02-16: qty 90, 90d supply, fill #1
  Filled 2023-05-19: qty 90, 90d supply, fill #2
  Filled 2023-08-09: qty 90, 90d supply, fill #3

## 2022-09-23 MED ORDER — CLONAZEPAM 0.5 MG PO TABS
0.5000 mg | ORAL_TABLET | Freq: Four times a day (QID) | ORAL | 5 refills | Status: DC | PRN
  Filled 2022-09-23 – 2022-10-13 (×2): qty 120, 30d supply, fill #0
  Filled 2022-11-11: qty 120, 30d supply, fill #1
  Filled 2022-12-17: qty 120, 30d supply, fill #2
  Filled 2023-01-18: qty 120, 30d supply, fill #3
  Filled 2023-03-11: qty 120, 30d supply, fill #4

## 2022-09-23 MED ORDER — FLUVOXAMINE MALEATE 100 MG PO TABS
100.0000 mg | ORAL_TABLET | Freq: Every day | ORAL | 3 refills | Status: DC
  Filled 2022-09-23 – 2022-11-07 (×2): qty 90, 90d supply, fill #0
  Filled 2023-02-03: qty 90, 90d supply, fill #1
  Filled 2023-05-03: qty 90, 90d supply, fill #2
  Filled 2023-08-09: qty 90, 90d supply, fill #3

## 2022-09-23 MED ORDER — QUETIAPINE FUMARATE 300 MG PO TABS
300.0000 mg | ORAL_TABLET | Freq: Every evening | ORAL | 3 refills | Status: DC
  Filled 2022-09-23 – 2022-11-07 (×2): qty 90, 90d supply, fill #0
  Filled 2023-02-03: qty 90, 90d supply, fill #1
  Filled 2023-05-03: qty 90, 90d supply, fill #2

## 2022-09-23 MED ORDER — GABAPENTIN 300 MG PO CAPS
900.0000 mg | ORAL_CAPSULE | Freq: Every day | ORAL | 3 refills | Status: DC
  Filled 2022-09-23 – 2022-10-21 (×3): qty 270, 90d supply, fill #0
  Filled 2023-01-14: qty 270, 90d supply, fill #1
  Filled 2023-04-16: qty 270, 90d supply, fill #2
  Filled 2023-07-14: qty 270, 90d supply, fill #3

## 2022-09-23 MED ORDER — QUETIAPINE FUMARATE 200 MG PO TABS
100.0000 mg | ORAL_TABLET | Freq: Every day | ORAL | 3 refills | Status: DC
  Filled 2022-09-23 – 2022-10-21 (×3): qty 90, 90d supply, fill #0
  Filled 2023-01-18: qty 90, 90d supply, fill #1
  Filled 2023-04-19: qty 90, 90d supply, fill #2

## 2022-09-23 MED ORDER — PRAZOSIN HCL 5 MG PO CAPS
10.0000 mg | ORAL_CAPSULE | Freq: Every day | ORAL | 3 refills | Status: DC
  Filled 2022-09-23: qty 166, 83d supply, fill #0
  Filled 2022-09-23: qty 180, 90d supply, fill #0
  Filled 2022-09-23: qty 14, 7d supply, fill #0
  Filled 2022-12-17: qty 180, 90d supply, fill #1
  Filled 2023-03-15: qty 180, 90d supply, fill #2
  Filled 2023-06-15: qty 180, 90d supply, fill #3

## 2022-09-23 NOTE — Progress Notes (Signed)
[facility] Check  Patient ID: Caroline Gonzales,  MRN: [id number]  PCP: [name], NP  Date of Evaluation: 09/23/2022 Time spent:30 minutes  Chief Complaint:  Chief Complaint   Anxiety; Depression; Follow-up    HISTORY/CURRENT STATUS: HPI for routine 7-monthfollow-up.  Doing well for the most part. Has had heart issues, (see below and records on chart) but other than that, is fine. Doesn't sleep well, and is very regimented out the time she takes her medications, the time she goes to bed every night, when she is on her phone she has a blue light filled her, not drinking caffeine at all.  She has always been a light sleeper.  Her husband tosses and turns a lot so that is part of the problem as well.  She is not having nightmares.  Is on prazosin 10 mg now and that has been very effective.  Patient is able to enjoy things.  Energy and motivation are good.  Work is going well, really likes this job.  No extreme sadness, tearfulness, or feelings of hopelessness.  ADLs and personal hygiene are normal.   Denies any changes in concentration, making decisions, or remembering things.  Appetite has not changed.  Weight is stable.  Still on Wegovy, wt has plateaued over past 6 months.  Denies suicidal or homicidal thoughts.  Anxiety is well controlled. Takes the Klonopin every evening. If she doesn't take it, can't relax to go to sleep. Not having PA but has a hard time quieting her mind.   Denies dizziness, syncope, seizures, numbness, tingling, tremor, tics, unsteady gait, slurred speech, confusion. Denies muscle or joint pain, stiffness, or dystonia. Denies unexplained weight loss, frequent infections, or sores that heal slowly.  No polyphagia, polydipsia, or polyuria. Denies visual changes or paresthesias.   Individual Medical History/ Review of Systems: Changes? :Yes    Has had PVCs, work up by cardiology. Had no QT interval issues  Past medications for mental health diagnoses  include: Prozac, Paxil, Effexor, Norpramin, Wellbutrin, doxepin, Elavil, Geodon, Abilify, Lexapro, Trileptal, Ambien, Lunesta, Sonata, trazodone, Restoril, Remeron, Seroquel, Klonopin, propranolol, Lamictal, Deplin, Latuda, Xanax made her feel horrible, Ativan didn't help, prazosin    Allergies: Bacitracin, Penicillins, Tamiflu [oseltamivir phosphate], Cephalosporins, and Latex  Current Medications:  Current Outpatient Medications:    Biotin 5 MG TABS, Take by mouth., Disp: , Rfl:    Carboxymethylcellul-Glycerin (LUBRICATING EYE DROPS OP), Place 1 drop into both eyes daily as needed (dryness/ allergies)., Disp: , Rfl:    cetirizine (ZYRTEC) 10 MG tablet, Take 10 mg by mouth at bedtime., Disp: , Rfl:    Cholecalciferol (VITAMIN D3) 5000 units CAPS, Take 5,000 Units by mouth at bedtime., Disp: , Rfl:    docusate sodium (COLACE) 100 MG capsule, Take 100 mg by mouth 2 (two) times daily., Disp: , Rfl:    ondansetron (ZOFRAN-ODT) 8 MG disintegrating tablet, Dissolve 1 tablet by mouth every 8 hours as needed for nausea., Disp: 30 tablet, Rfl: 6   propranolol (INDERAL) 40 MG tablet, Take 1 tablet (40 mg total) by mouth 2 (two) times daily., Disp: 180 tablet, Rfl: 0   Semaglutide-Weight Management (WEGOVY) 2.4 MG/0.75ML SOAJ, Inject 2.4 mg into the skin once a week., Disp: 9 mL, Rfl: 3   triamcinolone (NASACORT) 55 MCG/ACT AERO nasal inhaler, Place 1 spray into the nose at bedtime., Disp: , Rfl:    UNABLE TO FIND, Magtein qhs, Disp: , Rfl:    clindamycin (CLEOCIN T) 1 % external solution, Apply 1  Application topically 2 (two) times daily as needed. (Patient not taking: Reported on 09/23/2022), Disp: 60 mL, Rfl: 3   clonazePAM (KLONOPIN) 0.5 MG tablet, Take 1 tablet (0.5 mg total) by mouth every 6 (six) hours as needed for anxiety., Disp: 120 tablet, Rfl: 5   esomeprazole (NEXIUM) 20 MG capsule, Take 20 mg by mouth at bedtime. (Patient not taking: Reported on 09/23/2022), Disp: , Rfl:    fluvoxaMINE (LUVOX)  100 MG tablet, Take 1 tablet by mouth at bedtime. Take with 50 mg dose to equal 150 mg at bedtime, Disp: 90 tablet, Rfl: 3   fluvoxaMINE (LUVOX) 50 MG tablet, TAKE 1 TABLET BY MOUTH AT BEDTIME. TAKE IN ADDITION TO THE 100 MG TABLET TO EQUAL 150 MG TOTAL, Disp: 90 tablet, Rfl: 3   gabapentin (NEURONTIN) 300 MG capsule, Take 3 capsules (900 mg total) by mouth at bedtime., Disp: 270 capsule, Rfl: 3   prazosin (MINIPRESS) 5 MG capsule, Take 2 capsules (10 mg total) by mouth at bedtime., Disp: 180 capsule, Rfl: 3   QUEtiapine (SEROQUEL) 200 MG tablet, Take 1/2-1 tablet (100-200 mg total) by mouth at bedtime., Disp: 90 tablet, Rfl: 3   QUEtiapine (SEROQUEL) 300 MG tablet, Take 1 tablet by mouth at bedtime., Disp: 90 tablet, Rfl: 3 Medication Side Effects: none  Family Medical/ Social History: Changes?  Son came out to his dad.  MENTAL HEALTH EXAM:  Last menstrual period 01/06/2021.There is no height or weight on file to calculate BMI.  General Appearance: Casual, Neat, Well Groomed and Obese  Eye Contact:  Good  Speech:  Clear and Coherent and Normal Rate  Volume:  Normal  Mood:  Euthymic  Affect:  Appropriate  Thought Process:  Goal Directed and Descriptions of Associations: Circumstantial  Orientation:  Full (Time, Place, and Person)  Thought Content: Logical   Suicidal Thoughts:  No  Homicidal Thoughts:  No  Memory:  WNL  Judgement:  Good  Insight:  Good  Psychomotor Activity:  Normal  Concentration:  Concentration: Good and Attention Span: Good  Recall:  Good  Fund of Knowledge: Good  Language: Good  Assets:  Desire for Improvement Financial Resources/Insurance Housing Resilience Transportation Vocational/Educational  ADL's:  Intact  Cognition: WNL  Prognosis:  Good   Labs 07/24/2022 CBC with differential normal BMP glucose 113 otherwise normal TSH normal  07/02/2022 hemoglobin A1c 5.5  12/29/2021 Lipid panel triglycerides 279, HDL 35, LDL 66  DIAGNOSES:     ICD-10-CM   1. PTSD (post-traumatic stress disorder)  F43.10     2. Recurrent major depressive disorder, in partial remission ([place])  F33.41     3. Generalized anxiety disorder  F41.1     4. Obstructive sleep apnea  G47.33     5. Nightmares  F51.5       Receiving Psychotherapy: Yes  Heather Mask  RECOMMENDATIONS:  PDMP was reviewed.  Last Klonopin filled 08/24/2022. I provided 30 minutes of face to face time during this encounter, including time spent before and after the visit in records review, medical decision making, counseling pertinent to today's visit, and charting.   Sleep hygiene discussed, Amber colored glasses discussed, recommend melatonin, explained benefits, risks and side effects.  Lower dose is more effective than higher dose.  Continue Klonopin 0.5 mg, 1 p.o. 4 times daily as needed. Continue Luvox 150 mg nightly. Continue gabapentin 300 mg, 3 p.o. q. evening. Continue prazosin 5 mg, 2 po qhs.  Continue propranolol 40 mg, 1 p.o. bid.  Continue Seroquel 500  mg nightly. Continue counseling with Heather Mask. Return in 6 months.  Donnal Moat, PA-C

## 2022-09-25 ENCOUNTER — Other Ambulatory Visit (HOSPITAL_BASED_OUTPATIENT_CLINIC_OR_DEPARTMENT_OTHER): Payer: Self-pay | Admitting: Nurse Practitioner

## 2022-09-25 DIAGNOSIS — R7303 Prediabetes: Secondary | ICD-10-CM

## 2022-09-30 ENCOUNTER — Other Ambulatory Visit: Payer: Self-pay

## 2022-10-06 DIAGNOSIS — F4310 Post-traumatic stress disorder, unspecified: Secondary | ICD-10-CM | POA: Diagnosis not present

## 2022-10-13 ENCOUNTER — Other Ambulatory Visit (HOSPITAL_COMMUNITY): Payer: Self-pay

## 2022-10-13 ENCOUNTER — Other Ambulatory Visit (HOSPITAL_BASED_OUTPATIENT_CLINIC_OR_DEPARTMENT_OTHER): Payer: Self-pay | Admitting: Nurse Practitioner

## 2022-10-13 ENCOUNTER — Encounter: Payer: Self-pay | Admitting: Nurse Practitioner

## 2022-10-13 DIAGNOSIS — R7303 Prediabetes: Secondary | ICD-10-CM

## 2022-10-14 ENCOUNTER — Other Ambulatory Visit: Payer: Self-pay

## 2022-10-14 ENCOUNTER — Other Ambulatory Visit (HOSPITAL_COMMUNITY): Payer: Self-pay

## 2022-10-14 MED ORDER — ONDANSETRON 8 MG PO TBDP
8.0000 mg | ORAL_TABLET | Freq: Three times a day (TID) | ORAL | 2 refills | Status: DC | PRN
  Filled 2022-10-14: qty 30, 10d supply, fill #0

## 2022-10-15 ENCOUNTER — Other Ambulatory Visit (HOSPITAL_COMMUNITY): Payer: Self-pay

## 2022-10-15 ENCOUNTER — Other Ambulatory Visit: Payer: Self-pay

## 2022-10-15 MED ORDER — ONDANSETRON 8 MG PO TBDP
8.0000 mg | ORAL_TABLET | Freq: Three times a day (TID) | ORAL | 11 refills | Status: DC | PRN
  Filled 2022-10-15: qty 30, 10d supply, fill #0
  Filled 2022-11-11: qty 30, 10d supply, fill #1
  Filled 2022-12-17: qty 30, 10d supply, fill #2
  Filled 2023-01-18: qty 30, 10d supply, fill #3
  Filled 2023-02-16: qty 30, 10d supply, fill #4
  Filled 2023-03-15: qty 30, 10d supply, fill #5
  Filled 2023-04-16: qty 30, 10d supply, fill #6
  Filled 2023-05-19: qty 30, 10d supply, fill #7
  Filled 2023-06-01: qty 30, 10d supply, fill #8
  Filled 2023-09-27: qty 30, 10d supply, fill #9

## 2022-10-16 ENCOUNTER — Ambulatory Visit (AMBULATORY_SURGERY_CENTER): Payer: 59

## 2022-10-16 ENCOUNTER — Other Ambulatory Visit (HOSPITAL_COMMUNITY): Payer: Self-pay

## 2022-10-16 VITALS — Ht 65.0 in | Wt 259.0 lb

## 2022-10-16 DIAGNOSIS — Z1211 Encounter for screening for malignant neoplasm of colon: Secondary | ICD-10-CM

## 2022-10-16 MED ORDER — NA SULFATE-K SULFATE-MG SULF 17.5-3.13-1.6 GM/177ML PO SOLN
1.0000 | Freq: Once | ORAL | 0 refills | Status: AC
  Filled 2022-10-16: qty 354, 1d supply, fill #0

## 2022-10-16 NOTE — Progress Notes (Signed)
No egg or soy allergy known to patient  PONV with sedation  Patient denies ever being told they had issues or difficulty with intubation  No FH of Malignant Hyperthermia Pt is not on diet pills Pt is not on  home 02  Pt is not on blood thinners  Pt denies issues with constipation  No A fib or A flutter Have any cardiac testing pending-- no but states she does have intermittent PVS on propanolol  Pt instructed to use Singlecare.com or GoodRx for a price reduction on prep   Patient's chart reviewed by Osvaldo Angst CNRA prior to previsit and patient appropriate for the [place].  Previsit completed and red dot placed by patient's name on their procedure day (on provider's schedule).

## 2022-10-17 ENCOUNTER — Other Ambulatory Visit (HOSPITAL_COMMUNITY): Payer: Self-pay

## 2022-10-20 ENCOUNTER — Other Ambulatory Visit: Payer: Self-pay

## 2022-10-20 DIAGNOSIS — F4310 Post-traumatic stress disorder, unspecified: Secondary | ICD-10-CM | POA: Diagnosis not present

## 2022-10-21 ENCOUNTER — Other Ambulatory Visit (HOSPITAL_COMMUNITY): Payer: Self-pay

## 2022-10-21 ENCOUNTER — Other Ambulatory Visit: Payer: Self-pay

## 2022-11-03 ENCOUNTER — Encounter: Payer: Self-pay | Admitting: Internal Medicine

## 2022-11-03 DIAGNOSIS — F4310 Post-traumatic stress disorder, unspecified: Secondary | ICD-10-CM | POA: Diagnosis not present

## 2022-11-09 ENCOUNTER — Other Ambulatory Visit: Payer: Self-pay

## 2022-11-09 ENCOUNTER — Other Ambulatory Visit (HOSPITAL_COMMUNITY): Payer: Self-pay

## 2022-11-12 ENCOUNTER — Other Ambulatory Visit (HOSPITAL_COMMUNITY): Payer: Self-pay

## 2022-11-12 ENCOUNTER — Encounter: Payer: Self-pay | Admitting: Nurse Practitioner

## 2022-11-12 ENCOUNTER — Telehealth: Payer: Self-pay | Admitting: *Deleted

## 2022-11-12 ENCOUNTER — Other Ambulatory Visit: Payer: Self-pay

## 2022-11-12 NOTE — Telephone Encounter (Signed)
Patient is requesting to switch provider from Dr. Phineas Inches at [facility] to Dr. Harrell Gave at [facility].  Patient does have follow up visit scheduled for June 2024.   Upon Dr. Harl Bowie and Dr. Harrell Gave approval of the transfer, I will call the patient back and cancel follow with Dr. Harl Bowie and get her scheduled with Dr. Harrell Gave.

## 2022-11-15 ENCOUNTER — Encounter: Payer: Self-pay | Admitting: Certified Registered Nurse Anesthetist

## 2022-11-16 ENCOUNTER — Encounter: Payer: Self-pay | Admitting: Internal Medicine

## 2022-11-16 ENCOUNTER — Ambulatory Visit (AMBULATORY_SURGERY_CENTER): Payer: Self-pay | Admitting: Internal Medicine

## 2022-11-16 VITALS — BP 105/66 | HR 73 | Temp 98.7°F | Resp 15 | Ht 65.0 in | Wt 260.1 lb

## 2022-11-16 DIAGNOSIS — D120 Benign neoplasm of cecum: Secondary | ICD-10-CM

## 2022-11-16 DIAGNOSIS — Z1211 Encounter for screening for malignant neoplasm of colon: Secondary | ICD-10-CM | POA: Diagnosis not present

## 2022-11-16 DIAGNOSIS — F4310 Post-traumatic stress disorder, unspecified: Secondary | ICD-10-CM | POA: Diagnosis not present

## 2022-11-16 DIAGNOSIS — K219 Gastro-esophageal reflux disease without esophagitis: Secondary | ICD-10-CM | POA: Diagnosis not present

## 2022-11-16 DIAGNOSIS — D123 Benign neoplasm of transverse colon: Secondary | ICD-10-CM

## 2022-11-16 MED ORDER — SODIUM CHLORIDE 0.9 % IV SOLN: 500.0000 mL | Freq: Once | INTRAVENOUS | Status: DC

## 2022-11-16 NOTE — Op Note (Signed)
[place] Patient Name: Caroline Gonzales Procedure Date: 11/16/2022 11:01 AM MRN: [id number] Endoscopist: Jerene Bears , MD, [id number] Age: 47 Referring MD:  Date of Birth: 1976/06/01 Gender: Female Account #: [id number] Procedure:                Colonoscopy Indications:              Screening for colorectal malignant neoplasm, This                            is the patient's first colonoscopy Medicines:                Monitored Anesthesia Care Procedure:                Pre-Anesthesia Assessment:                           - Prior to the procedure, a History and Physical                            was performed, and patient medications and                            allergies were reviewed. The patient's tolerance of                            previous anesthesia was also reviewed. The risks                            and benefits of the procedure and the sedation                            options and risks were discussed with the patient.                            All questions were answered, and informed consent                            was obtained. Prior Anticoagulants: The patient has                            taken no anticoagulant or antiplatelet agents. ASA                            Grade Assessment: II - A patient with mild systemic                            disease. After reviewing the risks and benefits,                            the patient was deemed in satisfactory condition to                            undergo the procedure.  After obtaining informed consent, the colonoscope                            was passed under direct vision. Throughout the                            procedure, the patient's blood pressure, pulse, and                            oxygen saturations were monitored continuously. The                            Olympus [id number] Colonoscope was                            introduced through the  anus and advanced to the                            cecum, identified by appendiceal orifice and                            ileocecal valve. The colonoscopy was performed                            without difficulty. The patient tolerated the                            procedure well. The quality of the bowel                            preparation was good. The ileocecal valve,                            appendiceal orifice, and rectum were photographed. Scope In: 11:13:34 AM Scope Out: 11:29:14 AM Scope Withdrawal Time: 0 hours 13 minutes 58 seconds  Total Procedure Duration: 0 hours 15 minutes 40 seconds  Findings:                 The digital rectal exam was normal.                           A 5 mm polyp was found in the cecum. The polyp was                            sessile. The polyp was removed with a cold snare.                            Resection and retrieval were complete.                           A 3 mm polyp was found in the transverse colon. The                            polyp was sessile. The polyp  was removed with a                            cold snare. Resection and retrieval were complete.                           Multiple medium-mouthed and small-mouthed                            diverticula were found in the proximal sigmoid                            colon, descending colon and hepatic flexure.                           The exam was otherwise without abnormality on                            direct and retroflexion views. Complications:            No immediate complications. Estimated Blood Loss:     Estimated blood loss: none. Impression:               - One 5 mm polyp in the cecum, removed with a cold                            snare. Resected and retrieved.                           - One 3 mm polyp in the transverse colon, removed                            with a cold snare. Resected and retrieved.                           - Mild diverticulosis in the  proximal sigmoid                            colon, in the descending colon and at the hepatic                            flexure.                           - The examination was otherwise normal on direct                            and retroflexion views. Recommendation:           - Patient has a contact number available for                            emergencies. The signs and symptoms of potential                            delayed complications were discussed with  the                            patient. Return to normal activities tomorrow.                            Written discharge instructions were provided to the                            patient.                           - Resume previous diet.                           - Continue present medications.                           - Await pathology results.                           - Repeat colonoscopy is recommended. The                            colonoscopy date will be determined after pathology                            results from today's exam become available for                            review. Jerene Bears, MD 11/16/2022 11:31:39 AM This report has been signed electronically.

## 2022-11-16 NOTE — Patient Instructions (Addendum)
Resume previous medications.  Await results for final recommendations.  Handouts on findings given to patient.  (Polyps and diverticulosis)                           - Resume previous diet.                           - Continue present medications.                           - Await pathology results.                           - Repeat colonoscopy is recommended. The                            colonoscopy date will be determined after pathology                            results from today's exam become available for                            review.   YOU HAD AN ENDOSCOPIC PROCEDURE TODAY AT [facility]:   Refer to the procedure report that was given to you for any specific questions about what was found during the examination.  If the procedure report does not answer your questions, please call your gastroenterologist to clarify.  If you requested that your care partner not be given the details of your procedure findings, then the procedure report has been included in a sealed envelope for you to review at your convenience later.  YOU SHOULD EXPECT: Some feelings of bloating in the abdomen. Passage of more gas than usual.  Walking can help get rid of the air that was put into your GI tract during the procedure and reduce the bloating. If you had a lower endoscopy (such as a colonoscopy or flexible sigmoidoscopy) you may notice spotting of blood in your stool or on the toilet paper. If you underwent a bowel prep for your procedure, you may not have a normal bowel movement for a few days.  Please Note:  You might notice some irritation and congestion in your nose or some drainage.  This is from the oxygen used during your procedure.  There is no need for concern and it should clear up in a day or so.  SYMPTOMS TO REPORT IMMEDIATELY:  Following lower endoscopy (colonoscopy or flexible sigmoidoscopy):  Excessive amounts of blood in the stool  Significant tenderness or worsening of  abdominal pains  Swelling of the abdomen that is new, acute  Fever of 100F or higher   For urgent or emergent issues, a gastroenterologist can be reached at any hour by calling [phone]. Do not use MyChart messaging for urgent concerns.    DIET:  We do recommend a small meal at first, but then you may proceed to your regular diet.  Drink plenty of fluids but you should avoid alcoholic beverages for 24 hours.  ACTIVITY:  You should plan to take it easy for the rest of today and you should NOT DRIVE or use heavy machinery until tomorrow (because of the sedation medicines used during the  test).    FOLLOW UP: Our staff will call the number listed on your records the next business day following your procedure.  We will call around 7:15- 8:00 am to check on you and address any questions or concerns that you may have regarding the information given to you following your procedure. If we do not reach you, we will leave a message.     If any biopsies were taken you will be contacted by phone or by letter within the next 1-3 weeks.  Please call us at [phone] if you have not heard about the biopsies in 3 weeks.    SIGNATURES/CONFIDENTIALITY: You and/or your care partner have signed paperwork which will be entered into your electronic medical record.  These signatures attest to the fact that that the information above on your After Visit Summary has been reviewed and is understood.  Full responsibility of the confidentiality of this discharge information lies with you and/or your care-partner.

## 2022-11-16 NOTE — Progress Notes (Signed)
VS completed by DT.  Pt's states no medical or surgical changes since previsit or office visit.  

## 2022-11-16 NOTE — Progress Notes (Signed)
GASTROENTEROLOGY PROCEDURE H&P NOTE   Primary Care Physician: [name], NP    Reason for Procedure:  Colon cancer screening  Plan:    Colonoscopy  Patient is appropriate for endoscopic procedure(s) in the ambulatory ([place]) setting.  The nature of the procedure, as well as the risks, benefits, and alternatives were carefully and thoroughly reviewed with the patient. Ample time for discussion and questions allowed. The patient understood, was satisfied, and agreed to proceed.     HPI: Caroline Gonzales is a 47 y.o. female who presents for screening colonoscopy.  Medical history as below.  Tolerated the prep.  No recent chest pain or shortness of breath.  No abdominal pain today.  Past Medical History:  Diagnosis Date   Allergy    Zyrtec.   Anxiety    followed by Dr. Toy Cookey   Back pain    Complication of anesthesia    PONV   Depression    Fibroid    Gastroparesis 09/14/2012   gastric emptying study in 2014   GERD (gastroesophageal reflux disease)    Headache    Hypertriglyceridemia 10/07/2017   Pneumonia    2013ish   PONV (postoperative nausea and vomiting)     likes scopolamine patch and zofran /phenergan helps   Pre-diabetes    PTSD (post-traumatic stress disorder)    PTSD (post-traumatic stress disorder)    Screening for colon cancer 07/02/2022   Sleep apnea 07/01/2018   cpap optional, pt close to not use cpap   Vitamin D deficiency    Wears glasses    Wears glasses     Past Surgical History:  Procedure Laterality Date   ABDOMINAL HYSTERECTOMY  01/2021   ANKLE ARTHROSCOPY Left 2011   CESAREAN SECTION  06/2006   x 1   CHOLECYSTECTOMY  07/2006   laparoscopic   CYSTOSCOPY N/A 02/04/2021   Procedure: CYSTOSCOPY;  Surgeon: Salvadore Dom, MD;  Location: [facility];  Service: Gynecology;  Laterality: N/A;   DILATION AND CURETTAGE OF UTERUS  1997   x 2   FOREIGN BODY REMOVAL Left 11/18/2016   Procedure: REMOVAL FOREIGN BODY EXTREMITY LEFT FOOT;   Surgeon: Trula Slade, DPM;  Location: [facility];  Service: Podiatry;  Laterality: Left;   KNEE ARTHROSCOPY WITH MEDIAL MENISECTOMY Right 09/10/2021   Procedure: KNEE ARTHROSCOPY WITH MEDIAL MENISCAL ROOT REPAIR;  Surgeon: Hiram Gash, MD;  Location: [facility];  Service: Orthopedics;  Laterality: Right;   PILONIDAL CYST EXCISION  1990's   RADIOLOGY WITH ANESTHESIA N/A 11/10/2018   Procedure: MRI WITH ANESTHESIA OF BRAIN AND ORBITS WITH AND WITHOUT CONTRAST;  Surgeon: Radiologist, Medication, MD;  Location: [facility];  Service: Radiology;  Laterality: N/A;   TENDON REPAIR Left 2011   Left Ankle   TOTAL LAPAROSCOPIC HYSTERECTOMY WITH SALPINGECTOMY Bilateral 02/04/2021   Procedure: TOTAL LAPAROSCOPIC HYSTERECTOMY WITH SALPINGECTOMY;  Surgeon: Salvadore Dom, MD;  Location: [facility];  Service: Gynecology;  Laterality: Bilateral;   UPPER GASTROINTESTINAL ENDOSCOPY     WISDOM TOOTH EXTRACTION  1990's    Prior to Admission medications   Medication Sig Start Date End Date Taking? Authorizing Provider  Biotin 5 MG TABS Take by mouth.   Yes [provider]  cetirizine (ZYRTEC) 10 MG tablet Take 10 mg by mouth as needed for allergies.   Yes [provider]  Cholecalciferol (VITAMIN D3) 5000 units CAPS Take 5,000 Units by mouth at bedtime.   Yes [provider]  clonazePAM (KLONOPIN) 0.5 MG  tablet Take 1 tablet (0.5 mg total) by mouth every 6 (six) hours as needed for anxiety. Patient taking differently: Take 0.5 mg by mouth every 6 (six) hours as needed for anxiety. 3 daily at bedtime 09/23/22  Yes Hurst, Teresa T, PA-C  docusate sodium (COLACE) 100 MG capsule Take 100 mg by mouth 2 (two) times daily.   Yes [provider]  esomeprazole (NEXIUM) 20 MG capsule Take 20 mg by mouth at bedtime.   Yes [provider]  fluvoxaMINE (LUVOX) 100 MG tablet Take 1 tablet by mouth at bedtime. Take with 50 mg dose to equal 150 mg at bedtime 09/23/22  Yes  Hurst, Teresa T, PA-C  fluvoxaMINE (LUVOX) 50 MG tablet TAKE 1 TABLET BY MOUTH AT BEDTIME. TAKE IN ADDITION TO THE 100 MG TABLET TO EQUAL 150 MG TOTAL 09/23/22  Yes Hurst, Teresa T, PA-C  gabapentin (NEURONTIN) 300 MG capsule Take 3 capsules (900 mg total) by mouth at bedtime. 09/23/22  Yes Hurst, Helene Kelp T, PA-C  ondansetron (ZOFRAN-ODT) 8 MG disintegrating tablet Dissolve 1 tablet by mouth every 8 hours as needed for nausea. 10/15/22  Yes Early, Coralee Pesa, NP  prazosin (MINIPRESS) 5 MG capsule Take 2 capsules (10 mg total) by mouth at bedtime. 09/23/22  Yes Addison Lank, PA-C  propranolol (INDERAL) 40 MG tablet Take 40 mg twice a day May take a extra 40 mg in pm if needed 09/30/22  Yes Branch, Royetta Crochet, MD  QUEtiapine (SEROQUEL) 200 MG tablet Take 0.5-1 tablets (100-200 mg total) by mouth at bedtime. 09/23/22  Yes Hurst, Helene Kelp T, PA-C  QUEtiapine (SEROQUEL) 300 MG tablet Take 1 tablet by mouth at bedtime. 09/23/22  Yes Hurst, Teresa T, PA-C  triamcinolone (NASACORT) 55 MCG/ACT AERO nasal inhaler Place 1 spray into the nose at bedtime.   Yes [provider]  UNABLE TO FIND Magtein qhs   Yes [provider]  Carboxymethylcellul-Glycerin (LUBRICATING EYE DROPS OP) Place 1 drop into both eyes daily as needed (dryness/ allergies).    [provider]  clindamycin (CLEOCIN T) 1 % external solution Apply 1 Application topically 2 (two) times daily as needed. Patient not taking: Reported on 09/23/2022 07/13/22     Semaglutide-Weight Management (WEGOVY) 2.4 MG/0.75ML SOAJ Inject 2.4 mg into the skin once a week. 03/26/22   [name], NP    Current Outpatient Medications  Medication Sig Dispense Refill   Biotin 5 MG TABS Take by mouth.     cetirizine (ZYRTEC) 10 MG tablet Take 10 mg by mouth as needed for allergies.     Cholecalciferol (VITAMIN D3) 5000 units CAPS Take 5,000 Units by mouth at bedtime.     clonazePAM (KLONOPIN) 0.5 MG tablet Take 1 tablet (0.5 mg total) by mouth every 6  (six) hours as needed for anxiety. (Patient taking differently: Take 0.5 mg by mouth every 6 (six) hours as needed for anxiety. 3 daily at bedtime) 120 tablet 5   docusate sodium (COLACE) 100 MG capsule Take 100 mg by mouth 2 (two) times daily.     esomeprazole (NEXIUM) 20 MG capsule Take 20 mg by mouth at bedtime.     fluvoxaMINE (LUVOX) 100 MG tablet Take 1 tablet by mouth at bedtime. Take with 50 mg dose to equal 150 mg at bedtime 90 tablet 3   fluvoxaMINE (LUVOX) 50 MG tablet TAKE 1 TABLET BY MOUTH AT BEDTIME. TAKE IN ADDITION TO THE 100 MG TABLET TO EQUAL 150 MG TOTAL 90 tablet 3  gabapentin (NEURONTIN) 300 MG capsule Take 3 capsules (900 mg total) by mouth at bedtime. 270 capsule 3   ondansetron (ZOFRAN-ODT) 8 MG disintegrating tablet Dissolve 1 tablet by mouth every 8 hours as needed for nausea. 30 tablet 11   prazosin (MINIPRESS) 5 MG capsule Take 2 capsules (10 mg total) by mouth at bedtime. 180 capsule 3   propranolol (INDERAL) 40 MG tablet Take 40 mg twice a day May take a extra 40 mg in pm if needed     QUEtiapine (SEROQUEL) 200 MG tablet Take 0.5-1 tablets (100-200 mg total) by mouth at bedtime. 90 tablet 3   QUEtiapine (SEROQUEL) 300 MG tablet Take 1 tablet by mouth at bedtime. 90 tablet 3   triamcinolone (NASACORT) 55 MCG/ACT AERO nasal inhaler Place 1 spray into the nose at bedtime.     UNABLE TO FIND Magtein qhs     Carboxymethylcellul-Glycerin (LUBRICATING EYE DROPS OP) Place 1 drop into both eyes daily as needed (dryness/ allergies).     clindamycin (CLEOCIN T) 1 % external solution Apply 1 Application topically 2 (two) times daily as needed. (Patient not taking: Reported on 09/23/2022) 60 mL 3   Semaglutide-Weight Management (WEGOVY) 2.4 MG/0.75ML SOAJ Inject 2.4 mg into the skin once a week. 9 mL 3   Current Facility-Administered Medications  Medication Dose Route Frequency Provider Last Rate Last Admin   0.9 %  sodium chloride infusion  500 mL Intravenous Once Kamari Buch, Lajuan Lines,  MD        Allergies as of 11/16/2022 - Review Complete 11/16/2022  Allergen Reaction Noted   Bacitracin Rash 09/23/2022   Penicillins Shortness Of Breath and Rash 12/01/2015   Tamiflu [oseltamivir phosphate] Anaphylaxis 12/01/2015   Cephalosporins Rash 12/01/2015   Latex Rash 12/12/2015    Family History  Problem Relation Age of Onset   Cancer Mother        squamous cell carcinoma   Hyperlipidemia Mother    Depression Mother    Anxiety disorder Mother    Obesity Mother    Heart disease Father        cardiomegaly, CHF; steroid use.   Hyperlipidemia Father    Hypertension Father    Mental retardation Father    High blood pressure Father    Depression Father    Anxiety disorder Father    Obesity Father    Rheum arthritis Sister    Multiple sclerosis Sister    Autoimmune disease Sister    Drug abuse Sister    Breast cancer Maternal Aunt    Thyroid cancer Paternal Aunt    Esophageal cancer Paternal Uncle    Cancer Maternal Grandmother    Diabetes Maternal Grandmother    Heart disease Maternal Grandmother    Hyperlipidemia Maternal Grandmother    Hypertension Maternal Grandmother    Stroke Maternal Grandmother    Heart disease Paternal Grandmother    Hypertension Paternal Grandmother    Heart disease Paternal Grandfather    Hyperlipidemia Paternal Grandfather    Mental illness Paternal Grandfather    Colon cancer Neg Hx    Colon polyps Neg Hx    Rectal cancer Neg Hx    Stomach cancer Neg Hx     Social History   Socioeconomic History   Marital status: Married    Spouse name: Not on file   Number of children: 2   Years of education: Not on file   Highest education level: Bachelor's degree (e.g., BA, AB, BS)  Occupational History   Occupation: Therapist, sports  Employer: [employer]  Tobacco Use   Smoking status: Former    Packs/day: 2.00    Years: 10.00    Total pack years: 20.00    Types: Cigarettes    Quit date: 09/14/2000    Years since quitting: 22.1   Smokeless  tobacco: Never  Vaping Use   Vaping Use: Never used  Substance and Sexual Activity   Alcohol use: Not Currently   Drug use: No   Sexual activity: Yes    Partners: Male    Birth control/protection: Other-see comments    Comment: Post-hysterectomy  Other Topics Concern   Not on file  Social History Narrative   Marital status:  Married in 02/2015      Children: 1 son (9); 1 stepdaughter (8)      Lives: with husband, son, stepdaughter joint      Employment: RN at [employer]; 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      Seatbelt: 100%; no texting while driving often       Social Determinants of Radio broadcast assistant Strain: Not on file  Food Insecurity: Not on file  Transportation Needs: Not on file  Physical Activity: Not on file  Stress: Not on file  Social Connections: Not on file  Intimate Partner Violence: Not on file    Physical Exam: Vital signs in last 24 hours: '@BP'$  125/76   Pulse 84   Temp 98.7 F (37.1 C) (Temporal)   Ht '5\' 5"'$  (1.651 m)   Wt 260 lb 1.6 oz (118 kg)   LMP 01/06/2021   SpO2 98%   BMI 43.28 kg/m  GEN: NAD EYE: Sclerae anicteric ENT: MMM CV: Non-tachycardic Pulm: CTA b/l GI: Soft, NT/ND NEURO:  Alert & Oriented x 3   Zenovia Jarred, MD [facility]  11/16/2022 10:58 AM

## 2022-11-16 NOTE — Progress Notes (Signed)
Called to room to assist during endoscopic procedure.  Patient ID and intended procedure confirmed with present staff. Received instructions for my participation in the procedure from the performing physician.  

## 2022-11-16 NOTE — Progress Notes (Signed)
Report given to PACU, vss 

## 2022-11-16 NOTE — Progress Notes (Signed)
Pt states that she feels like she needs to pass air but refuses until she gets up to the restroom.

## 2022-11-17 ENCOUNTER — Telehealth: Payer: Self-pay | Admitting: *Deleted

## 2022-11-17 DIAGNOSIS — F4310 Post-traumatic stress disorder, unspecified: Secondary | ICD-10-CM | POA: Diagnosis not present

## 2022-11-17 NOTE — Telephone Encounter (Signed)
  Follow up Call-     11/16/2022   10:03 AM  Call back number  Post procedure Call Back phone  # [phone]  Permission to leave phone message Yes     Patient questions:  Do you have a fever, pain , or abdominal swelling? No. Pain Score  0 *  Have you tolerated food without any problems? Yes.    Have you been able to return to your normal activities? Yes.    Do you have any questions about your discharge instructions: Diet   No. Medications  No. Follow up visit  No.  Do you have questions or concerns about your Care? No.  Actions: * If pain score is 4 or above: No action needed, pain <4.

## 2022-11-19 ENCOUNTER — Other Ambulatory Visit (HOSPITAL_COMMUNITY): Payer: Self-pay

## 2022-11-19 ENCOUNTER — Encounter: Payer: Self-pay | Admitting: Internal Medicine

## 2022-11-19 ENCOUNTER — Other Ambulatory Visit: Payer: Self-pay

## 2022-11-24 DIAGNOSIS — F4310 Post-traumatic stress disorder, unspecified: Secondary | ICD-10-CM | POA: Diagnosis not present

## 2022-11-26 ENCOUNTER — Encounter: Payer: Self-pay | Admitting: Surgery

## 2022-11-26 DIAGNOSIS — K219 Gastro-esophageal reflux disease without esophagitis: Secondary | ICD-10-CM | POA: Diagnosis not present

## 2022-11-27 ENCOUNTER — Other Ambulatory Visit (HOSPITAL_COMMUNITY): Payer: Self-pay | Admitting: Surgery

## 2022-12-01 DIAGNOSIS — F4310 Post-traumatic stress disorder, unspecified: Secondary | ICD-10-CM | POA: Diagnosis not present

## 2022-12-02 ENCOUNTER — Other Ambulatory Visit: Payer: Self-pay

## 2022-12-05 ENCOUNTER — Other Ambulatory Visit (HOSPITAL_COMMUNITY): Payer: Self-pay

## 2022-12-05 ENCOUNTER — Other Ambulatory Visit: Payer: Self-pay

## 2022-12-07 ENCOUNTER — Other Ambulatory Visit: Payer: Self-pay

## 2022-12-07 ENCOUNTER — Other Ambulatory Visit (HOSPITAL_COMMUNITY): Payer: Self-pay

## 2022-12-07 ENCOUNTER — Encounter (HOSPITAL_COMMUNITY): Payer: Self-pay

## 2022-12-09 ENCOUNTER — Other Ambulatory Visit (HOSPITAL_COMMUNITY): Payer: Self-pay

## 2022-12-14 ENCOUNTER — Ambulatory Visit (HOSPITAL_COMMUNITY)
Admission: RE | Admit: 2022-12-14 | Discharge: 2022-12-14 | Disposition: A | Discharge status: Home / Self Care | Payer: 59 | Source: Ambulatory Visit | Attending: Surgery | Admitting: Surgery

## 2022-12-14 ENCOUNTER — Other Ambulatory Visit (HOSPITAL_COMMUNITY): Payer: Self-pay | Admitting: Surgery

## 2022-12-14 DIAGNOSIS — Z01818 Encounter for other preprocedural examination: Secondary | ICD-10-CM | POA: Diagnosis not present

## 2022-12-14 IMAGING — DX DG CHEST 2V
2 series · 2 of 2 positions shown · non-contrast
Comparison: Chest radiograph 06/19/2016

CLINICAL DATA: Pt presents with a sore throat, cough, loss of voice
x 1 month. Pt denies fever and states she is SOB. She states she has
been tested for COVID through [facility] and states she has taken several
at home tests that have resulted negative.

EXAM:
CHEST - 2 VIEW

[chest pa]
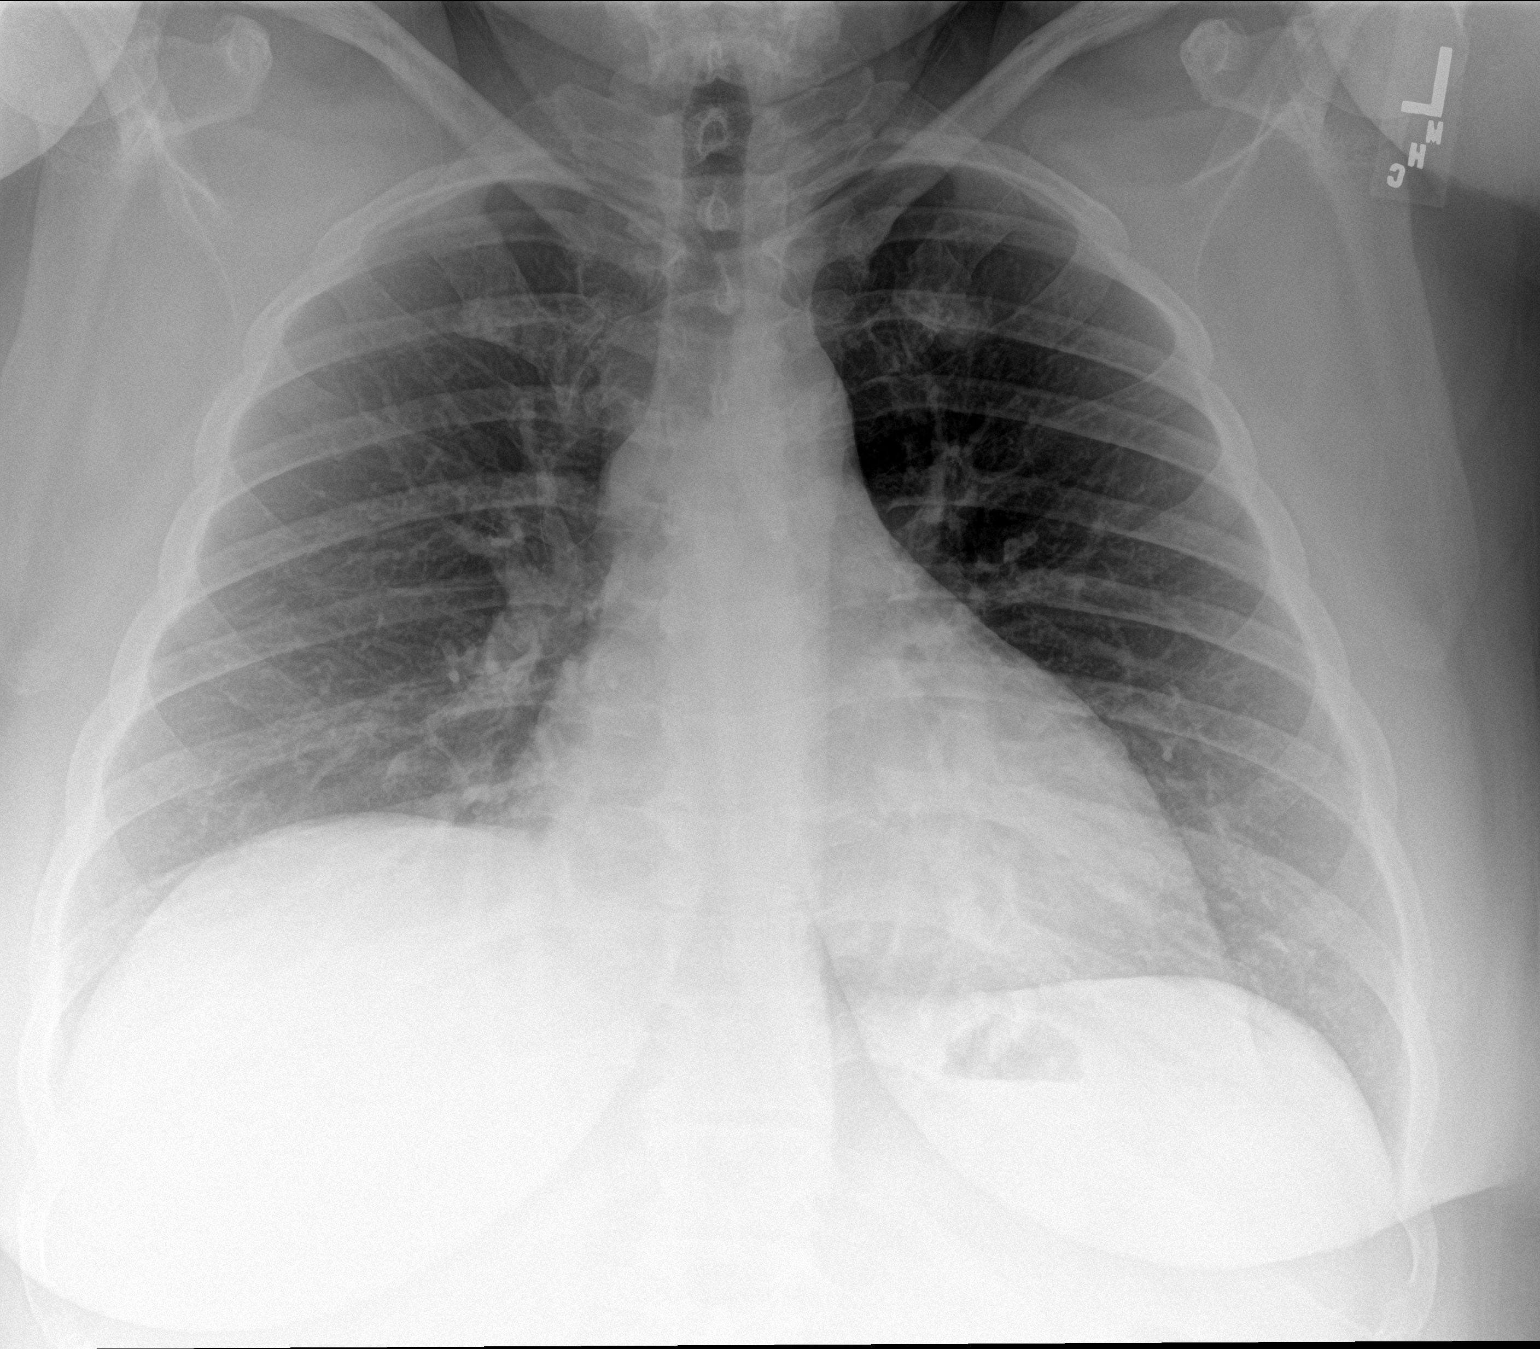

[chest lat]
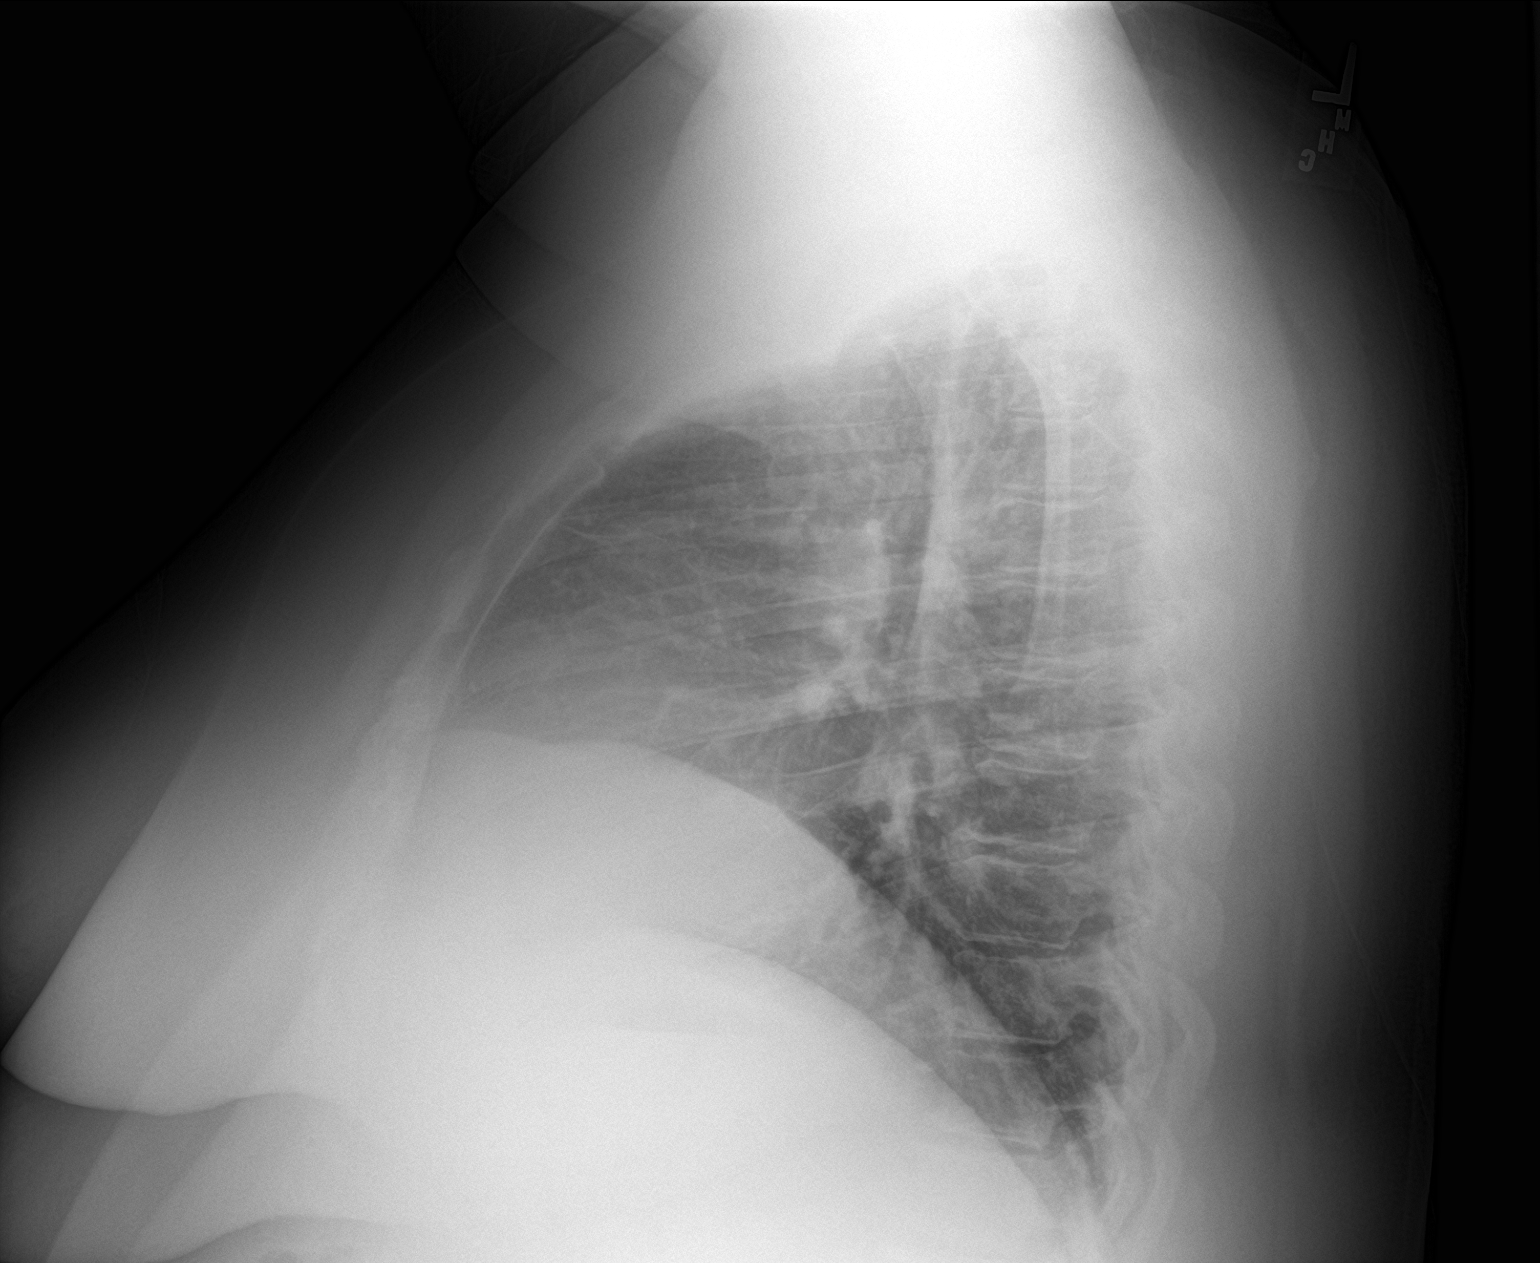

[2 of 2 positions shown; findings below may reference images not displayed]

FINDINGS: Stable cardiomediastinal contours. The lungs are clear. No
pneumothorax or pleural effusion. No acute finding in the visualized
skeleton. The the
IMPRESSION: No active cardiopulmonary disease.

## 2022-12-17 ENCOUNTER — Encounter: Payer: Self-pay | Admitting: Nurse Practitioner

## 2022-12-17 ENCOUNTER — Other Ambulatory Visit: Payer: Self-pay

## 2022-12-17 ENCOUNTER — Ambulatory Visit: Payer: 59 | Admitting: Nurse Practitioner

## 2022-12-17 VITALS — BP 126/84 | HR 72 | Wt 262.8 lb

## 2022-12-17 DIAGNOSIS — R030 Elevated blood-pressure reading, without diagnosis of hypertension: Secondary | ICD-10-CM

## 2022-12-17 DIAGNOSIS — R0789 Other chest pain: Secondary | ICD-10-CM | POA: Diagnosis not present

## 2022-12-17 DIAGNOSIS — K219 Gastro-esophageal reflux disease without esophagitis: Secondary | ICD-10-CM

## 2022-12-17 DIAGNOSIS — R7303 Prediabetes: Secondary | ICD-10-CM

## 2022-12-17 DIAGNOSIS — R899 Unspecified abnormal finding in specimens from other organs, systems and tissues: Secondary | ICD-10-CM

## 2022-12-17 DIAGNOSIS — Z6841 Body Mass Index (BMI) 40.0 and over, adult: Secondary | ICD-10-CM | POA: Diagnosis not present

## 2022-12-17 NOTE — Progress Notes (Signed)
Shawna Clamp, DNP, AGNP-c [facility]  [address] [place], [place] [address] [phone]  ESTABLISHED PATIENT- Chronic Health and/or Follow-Up Visit  Blood pressure 126/84, pulse 72, weight 262 lb 12.8 oz (119.2 kg), last menstrual period 01/06/2021.    Caroline Gonzales is a 47 y.o. year old female presenting today for evaluation and management of chronic conditions.   Caroline Gonzales is currently being evaluated for Roux-en-Y gastric bypass surgery and is under the care of Dr. Fredricka Bonine at [facility]. She has diligently completed the majority of the pre-operative assessments, which include a psychological evaluation, an esophagogram, various laboratory tests, an H. pylori test, and a colonoscopy. The patient expresses a mix of excitement and apprehension regarding the upcoming surgery. She has proactively engaged with her therapist and psychiatrist to review her current medication regimen in preparation for the procedure.  She has a notable family medical history that includes esophageal cancer, Barrett's esophagus, and gastroesophageal reflux disease. The patient's personal health objectives are to improve her physical fitness to the point where she can ascend three flights of stairs without difficulty, shop for clothing in standard retail stores, and extend the distance she can comfortably walk.  Recent laboratory findings have indicated that the patient has abnormal cholesterol levels and a low Total Iron-Binding Capacity (TIBC). She is presently taking Wegovy and intends to continue this medication up until the time of her surgery. The patient has been actively researching and considering various protein supplements, as well as strategies to enhance her protein consumption following the surgery. She demonstrates an understanding of the necessity to adhere to a vitamin regimen and ensure adequate nutrient intake post-operatively.  Additionally, the patient describes  experiencing a sensation of tightness in her breathing and challenges with taking deep breaths. She reports having had a cold the previous week and has also recently completed an esophagram. However, she denies experiencing any significant pain or tenderness in the chest region. All ROS negative with exception of what is listed above.   PHYSICAL EXAM Physical Exam Vitals and nursing note reviewed.  Constitutional:      General: She is not in acute distress.    Appearance: Normal appearance. She is obese.  HENT:     Head: Normocephalic.  Eyes:     Extraocular Movements: Extraocular movements intact.     Conjunctiva/sclera: Conjunctivae normal.     Pupils: Pupils are equal, round, and reactive to light.  Neck:     Vascular: No carotid bruit.  Cardiovascular:     Rate and Rhythm: Normal rate and regular rhythm.     Pulses: Normal pulses.     Heart sounds: Normal heart sounds. No murmur heard. Pulmonary:     Effort: Pulmonary effort is normal.     Breath sounds: No wheezing.     Comments: Pleural rub auscultated on right.  Chest:     Chest wall: Tenderness present.  Abdominal:     General: Bowel sounds are normal. There is no distension.     Palpations: Abdomen is soft.     Tenderness: There is no abdominal tenderness. There is no guarding.  Musculoskeletal:        General: Normal range of motion.     Cervical back: Normal range of motion and neck supple.     Right lower leg: No edema.     Left lower leg: No edema.  Lymphadenopathy:     Cervical: No cervical adenopathy.  Skin:    General: Skin is warm and dry.  Capillary Refill: Capillary refill takes less than 2 seconds.  Neurological:     General: No focal deficit present.     Mental Status: She is alert and oriented to person, place, and time.  Psychiatric:        Mood and Affect: Mood normal.        Behavior: Behavior normal.        Thought Content: Thought content normal.        Judgment: Judgment normal.      PLAN Problem List Items Addressed This Visit     Morbid obesity with body mass index (BMI) of 50.0 to 59.9 in adult - Primary    The patient is actively engaged in completing the necessary pre-operative steps for bariatric surgery under the care of Dr. Fredricka Bonine at [facility]. She has successfully finished her psychological evaluation, esophagogram, laboratory tests, H. pylori testing, and colonoscopy. A consultation with a nutritionist is on the agenda for the following week. The surgical procedure is tentatively scheduled for late July or Rahmir Beever August. I fully support her decision to proceed with this surgical intervention and feel that this will increase her overall health now and in the future.  Plan: - Support the patient in fulfilling all pre-operative requirements and ensure follow-up with Dr. Fredricka Bonine for ongoing management.      Pre-diabetes    Continue with Wegovy until surgery.       Gastroesophageal reflux disease    The patient has a chronic history of reflux and nocturnal aspiration. A Roux-en-Y surgical intervention is proposed. Recommend close following of dietary plan to prevent exacerbation of symptoms post-operatively.  Plan: - Maintain the current regimen of medications and arrange a follow-up with Dr. Fredricka Bonine to discuss the surgical approach.      Abnormal laboratory test    Recent laboratory findings indicate the patient has low levels of TIBC and albumin. Plan: - Advise the patient to enhance dietary protein consumption and contemplate the addition of protein supplements such as Ensure or protein powder. Post-surgical re-assessment of lab values is advised.      Chest wall pain    The patient presents with concerns of difficulty in taking deep breaths and muscle soreness on one side of the chest. A rub is auscultated on examination today, which may indicate pleurisy. Plan: - Administer ibuprofen 400 mg every 8 hours for the next few days to control  inflammation. Should symptoms persist or exacerbate, consider diagnostic imaging and further assessment.      Elevated blood pressure reading without diagnosis of hypertension    Return in about 6 months (around 06/18/2023).   Shawna Clamp, DNP, AGNP-c 12/17/2022  8:45 AM

## 2022-12-17 NOTE — Patient Instructions (Addendum)
Take 400mg  Ibuprofen every 8 hour for the next couple of days.   Do not hesitate to contact me if you have any concerns or questions! I am SO excited for you.

## 2022-12-18 ENCOUNTER — Other Ambulatory Visit: Payer: Self-pay

## 2022-12-18 ENCOUNTER — Other Ambulatory Visit (HOSPITAL_COMMUNITY): Payer: Self-pay

## 2022-12-22 ENCOUNTER — Encounter: Payer: 59 | Attending: Surgery | Admitting: Dietician

## 2022-12-22 ENCOUNTER — Encounter: Payer: Self-pay | Admitting: Dietician

## 2022-12-22 VITALS — Ht 65.0 in | Wt 266.5 lb

## 2022-12-22 DIAGNOSIS — F4310 Post-traumatic stress disorder, unspecified: Secondary | ICD-10-CM | POA: Diagnosis not present

## 2022-12-22 DIAGNOSIS — E669 Obesity, unspecified: Secondary | ICD-10-CM | POA: Insufficient documentation

## 2022-12-22 NOTE — Progress Notes (Signed)
Nutrition Assessment for Bariatric Surgery: Pre-Surgery Behavioral and Nutrition Intervention Program   Medical Nutrition Therapy  Appt Start Time: 11:15    End Time: 12:14  Patient was seen on 12/22/2022 for Pre-Operative Nutrition Assessment. Purpose of todays visit  enhance perioperative outcomes along with a healthy weight maintenance   Referral stated Supervised Weight Loss (SWL) visits needed: 0  Pt completed visits.   Pt has cleared nutrition requirements.   Planned surgery: RYGB Pt expectation of surgery: to be about 160-180, to be able to buy clothes in a regular store, and climb stairs; Pt states it is more about health than looks for her.   NUTRITION ASSESSMENT   Anthropometrics  Start weight at [facility]: 266.5 lbs (date: 12/22/2022)  Height: 65 in BMI: 44.35 kg/m2     Clinical   Pharmacotherapy: History of weight loss medication used: Wegovy (4 left)  Medical hx: GERD, hypercholesterolemia, obesity Medications: seroquel, luvox, vit D3, prazosin, nexium, zofran, propranolol, dlonopin, biotin, multivitamin colace, wegovy. Labs: triglycerides 357, HDL 33, VLDL 55 Notable signs/symptoms: none noted Any previous deficiencies? No  Evaluation of Nutritional Deficiencies: Micronutrient Nutrition Focused Physical Exam: Hair: No issues observed Eyes: No issues observed Mouth: No issues observed Neck: No issues observed Nails: No issues observed Skin: No issues observed  Lifestyle & Dietary Hx  Pt states she is planning to do surgery at the end of summer.  Pt states with FVWAQL, she stalled out, stating she lost about 40 lbs and have not lost any more.  Pt states she has really bad reflux, stating she tries not to eat after 6 pm. Pt states she gave up carbonated drinks a while ago. Pt states with the wegovy she states she does not turn to sweets any more.  Current Physical Activity Recommendations state 150 minutes per week of moderate to vigorous movement including  Cardio and 1-2 days of resistance activities as well as flexibility/balance activities:  Pts current physical activity: walking at work, with 0% recommendation reached   Sleep Hygiene: duration and quality: horable; with 8.5 a night, stating she wakes 5-7 times a night.  Current Patient Perceived Stress Level as stated by pt on a scale of 1-10:  2-3       Stress Management Techniques: breathing, medication, walk away, get outside  According to the Dietary Guidelines for Americans Recommendation: equivalent 1.5-2 cups fruits per day, equivalent 2-3 cups vegetables per day and at least half all grains whole  Fruit servings per day (on average): 0-2, meeting 50% recommendation  Non-starchy vegetable servings per day (on average): 0, meeting 0% recommendation  Whole Grains per day (on average): 2-3  Number of meals missed/skipped per week out of 21: 5-7  24-Hr Dietary Recall First Meal: drive thru egg mc muffin un sweet tea Snack:  Second Meal: grapes, 2 vegetables, meat or instant mac and cheese Snack:  Third Meal: meat (chicken or sausage or hot dog or  Snack: ice cream (mug) Beverages: un sweet tea, water  Alcoholic beverages per week: 0 (once/twice a year)   Estimated Energy Needs Calories: 1500   NUTRITION DIAGNOSIS  Overweight/obesity ([place]-3.3) related to past poor dietary habits and physical inactivity as evidenced by patient w/ planned RYGB surgery following dietary guidelines for continued weight loss.    NUTRITION INTERVENTION  Nutrition counseling (C-1) and education (E-2) to facilitate bariatric surgery goals.  Educated pt on micronutrient deficiencies post-surgery and behavioral/dietary strategies to start in order to mitigate that risk   Behavioral and Dietary Interventions Pre-Op  Goals Reviewed with the Patient Nutrition: Healthy Eating Behaviors Switch to non-caloric, non-carbonated and non-caffeinated beverages such as  water, unsweetened tea, Crystal Light and  zero calorie beverages (aim for 64 oz. per day) Cut out grazing between meals or at night  Find a protein shake you like Eat every 3-5 hours        Eliminate distractions while eating (TV, computer, reading, driving, texting) Take 90-24 minutes to eat a meal  Decrease high sugar foods/decrease high fat/fried foods Eliminate alcoholic beverages Increase protein intake (eggs, fish, chicken, yogurt) before surgery Eat non starchy vegetables 2 times a day 7 days a week Eat complex carbohydrates such as whole grains and fruits   Behavioral Modification: Physical Activity Increase my usual daily activity (use stairs, park farther, etc.) Engage in _______________________  activity  _______ minutes ______ times per week  Other:    _________________________________________________________________     Problem Solving I will think about my usual eating patterns and how to tweak them How can my friends and family support me Barriers to starting my changes Learn and understand appetite verses hunger   Healthy Coping Allow for ___________ activities per week to help me manage stress Reframe negative thoughts I will keep a picture of someone or something that is my inspiration & look at it daily   Monitoring  Weigh myself once a week  Measure my progress by monitoring how my clothes fit Keep a food record of what I eat and drink for the next ________ (time period) Take pictures of what I eat and drink for the next ________ (time period) Use an app to count steps/day for the next_______ (time period) Measure my progress such as increased energy and more restful sleep Monitor your acid reflux and bowel habits, are they getting better?   *Goals that are bolded indicate the pt would like to start working towards these  Handouts Provided Include  Bariatric Surgery handouts (Nutrition Visits, Pre Surgery Behavioral Change Goals, Protein Shakes Brands to Choose From, Vitamins & Mineral  Supplementation)  Learning Style & Readiness for Change Teaching method utilized: Visual, Auditory, and hands on  Demonstrated degree of understanding via: Teach Back  Readiness Level: preparation Barriers to learning/adherence to lifestyle change: nothing identified  RD's Notes for Next Visit     MONITORING & EVALUATION Dietary intake, weekly physical activity, body weight, and preoperative behavioral change goals   Next Steps  Pt has completed visits. No further supervised visits required/recommended. Patient is to follow up at [facility] for pre-op class >2 weeks prior to scheduled surgery.

## 2022-12-24 ENCOUNTER — Encounter: Payer: Self-pay | Admitting: Nurse Practitioner

## 2022-12-24 DIAGNOSIS — K219 Gastro-esophageal reflux disease without esophagitis: Secondary | ICD-10-CM | POA: Insufficient documentation

## 2022-12-24 DIAGNOSIS — R899 Unspecified abnormal finding in specimens from other organs, systems and tissues: Secondary | ICD-10-CM | POA: Insufficient documentation

## 2022-12-24 DIAGNOSIS — R0789 Other chest pain: Secondary | ICD-10-CM | POA: Insufficient documentation

## 2022-12-24 NOTE — Assessment & Plan Note (Signed)
The patient has a chronic history of reflux and nocturnal aspiration. A Roux-en-Y surgical intervention is proposed. Recommend close following of dietary plan to prevent exacerbation of symptoms post-operatively.  Plan: - Maintain the current regimen of medications and arrange a follow-up with Dr. Fredricka Bonine to discuss the surgical approach.

## 2022-12-24 NOTE — Assessment & Plan Note (Signed)
Continue with Wegovy until surgery.

## 2022-12-24 NOTE — Assessment & Plan Note (Signed)
Recent laboratory findings indicate the patient has low levels of TIBC and albumin. Plan: - Advise the patient to enhance dietary protein consumption and contemplate the addition of protein supplements such as Ensure or protein powder. Post-surgical re-assessment of lab values is advised.

## 2022-12-24 NOTE — Assessment & Plan Note (Signed)
The patient is actively engaged in completing the necessary pre-operative steps for bariatric surgery under the care of Dr. Fredricka Bonine at [facility]. She has successfully finished her psychological evaluation, esophagogram, laboratory tests, H. pylori testing, and colonoscopy. A consultation with a nutritionist is on the agenda for the following week. The surgical procedure is tentatively scheduled for late July or Caroline Gonzales August. I fully support her decision to proceed with this surgical intervention and feel that this will increase her overall health now and in the future.  Plan: - Support the patient in fulfilling all pre-operative requirements and ensure follow-up with Dr. Fredricka Bonine for ongoing management.

## 2022-12-24 NOTE — Assessment & Plan Note (Signed)
The patient presents with concerns of difficulty in taking deep breaths and muscle soreness on one side of the chest. A rub is auscultated on examination today, which may indicate pleurisy. Plan: - Administer ibuprofen 400 mg every 8 hours for the next few days to control inflammation. Should symptoms persist or exacerbate, consider diagnostic imaging and further assessment.

## 2022-12-29 ENCOUNTER — Encounter: Payer: Self-pay | Admitting: Nurse Practitioner

## 2022-12-29 DIAGNOSIS — R0789 Other chest pain: Secondary | ICD-10-CM

## 2022-12-29 DIAGNOSIS — F4310 Post-traumatic stress disorder, unspecified: Secondary | ICD-10-CM | POA: Diagnosis not present

## 2022-12-29 DIAGNOSIS — R0989 Other specified symptoms and signs involving the circulatory and respiratory systems: Secondary | ICD-10-CM

## 2022-12-30 ENCOUNTER — Ambulatory Visit (HOSPITAL_COMMUNITY)
Admission: RE | Admit: 2022-12-30 | Discharge: 2022-12-30 | Disposition: A | Discharge status: Home / Self Care | Payer: 59 | Source: Ambulatory Visit | Attending: Nurse Practitioner | Admitting: Nurse Practitioner

## 2022-12-30 DIAGNOSIS — R0989 Other specified symptoms and signs involving the circulatory and respiratory systems: Secondary | ICD-10-CM | POA: Insufficient documentation

## 2022-12-30 DIAGNOSIS — R0789 Other chest pain: Secondary | ICD-10-CM | POA: Insufficient documentation

## 2022-12-30 DIAGNOSIS — R079 Chest pain, unspecified: Secondary | ICD-10-CM | POA: Diagnosis not present

## 2023-01-05 DIAGNOSIS — F4310 Post-traumatic stress disorder, unspecified: Secondary | ICD-10-CM | POA: Diagnosis not present

## 2023-01-09 ENCOUNTER — Other Ambulatory Visit (HOSPITAL_COMMUNITY): Payer: Self-pay

## 2023-01-12 DIAGNOSIS — F4310 Post-traumatic stress disorder, unspecified: Secondary | ICD-10-CM | POA: Diagnosis not present

## 2023-01-18 ENCOUNTER — Other Ambulatory Visit: Payer: Self-pay

## 2023-01-18 ENCOUNTER — Telehealth: Payer: Self-pay | Admitting: Internal Medicine

## 2023-01-18 NOTE — Telephone Encounter (Signed)
New Message:      This patient would like to switch from Dr Carolan Clines to Dr Jodelle Red. Is this ok with you both?t

## 2023-01-18 NOTE — Telephone Encounter (Signed)
OK by me 

## 2023-01-19 DIAGNOSIS — F4310 Post-traumatic stress disorder, unspecified: Secondary | ICD-10-CM | POA: Diagnosis not present

## 2023-02-02 DIAGNOSIS — F4310 Post-traumatic stress disorder, unspecified: Secondary | ICD-10-CM | POA: Diagnosis not present

## 2023-02-15 ENCOUNTER — Ambulatory Visit: Payer: 59 | Admitting: Internal Medicine

## 2023-02-16 ENCOUNTER — Other Ambulatory Visit: Payer: Self-pay | Admitting: Nurse Practitioner

## 2023-02-16 DIAGNOSIS — F4310 Post-traumatic stress disorder, unspecified: Secondary | ICD-10-CM | POA: Diagnosis not present

## 2023-02-16 NOTE — Telephone Encounter (Signed)
Pt. Asking for refill on this and I did not see where you had filled it before. Last apt 12/17/22.

## 2023-02-17 MED ORDER — PROPRANOLOL HCL 40 MG PO TABS
ORAL_TABLET | ORAL | 3 refills | Status: DC
  Filled 2023-02-17: qty 270, 90d supply, fill #0
  Filled 2023-08-09: qty 270, 90d supply, fill #1
  Filled 2024-01-28: qty 270, 90d supply, fill #2

## 2023-02-18 ENCOUNTER — Other Ambulatory Visit (HOSPITAL_COMMUNITY): Payer: Self-pay

## 2023-02-18 DIAGNOSIS — D485 Neoplasm of uncertain behavior of skin: Secondary | ICD-10-CM | POA: Diagnosis not present

## 2023-02-18 DIAGNOSIS — D225 Melanocytic nevi of trunk: Secondary | ICD-10-CM | POA: Diagnosis not present

## 2023-02-18 DIAGNOSIS — Z1283 Encounter for screening for malignant neoplasm of skin: Secondary | ICD-10-CM | POA: Diagnosis not present

## 2023-02-23 DIAGNOSIS — F4310 Post-traumatic stress disorder, unspecified: Secondary | ICD-10-CM | POA: Diagnosis not present

## 2023-02-28 ENCOUNTER — Encounter: Payer: Self-pay | Admitting: Nurse Practitioner

## 2023-02-28 DIAGNOSIS — E781 Pure hyperglyceridemia: Secondary | ICD-10-CM

## 2023-02-28 DIAGNOSIS — R7303 Prediabetes: Secondary | ICD-10-CM

## 2023-02-28 DIAGNOSIS — M654 Radial styloid tenosynovitis [de Quervain]: Secondary | ICD-10-CM | POA: Diagnosis not present

## 2023-03-02 DIAGNOSIS — F4310 Post-traumatic stress disorder, unspecified: Secondary | ICD-10-CM | POA: Diagnosis not present

## 2023-03-03 ENCOUNTER — Other Ambulatory Visit (HOSPITAL_COMMUNITY): Payer: Self-pay

## 2023-03-03 MED ORDER — METFORMIN HCL ER 500 MG PO TB24
ORAL_TABLET | ORAL | 1 refills | Status: DC
Start: 2023-03-03 — End: 2023-04-13
  Filled 2023-03-03: qty 60, 33d supply, fill #0

## 2023-03-09 DIAGNOSIS — F4310 Post-traumatic stress disorder, unspecified: Secondary | ICD-10-CM | POA: Diagnosis not present

## 2023-03-12 ENCOUNTER — Other Ambulatory Visit: Payer: Self-pay

## 2023-03-15 ENCOUNTER — Other Ambulatory Visit: Payer: Self-pay

## 2023-03-15 ENCOUNTER — Other Ambulatory Visit (HOSPITAL_COMMUNITY): Payer: Self-pay

## 2023-03-15 ENCOUNTER — Encounter: Payer: Self-pay | Admitting: Skilled Nursing Facility1

## 2023-03-15 ENCOUNTER — Encounter: Payer: 59 | Attending: Surgery | Admitting: Skilled Nursing Facility1

## 2023-03-15 DIAGNOSIS — Z6841 Body Mass Index (BMI) 40.0 and over, adult: Secondary | ICD-10-CM | POA: Diagnosis not present

## 2023-03-15 NOTE — Progress Notes (Signed)
Pre-Operative Nutrition Class:    Patient was seen on 03/15/2023 for Pre-Operative Bariatric Surgery Education at [facility]    Surgery date: 04/13/2023 Surgery type: RYGB Start weight at [facility]: 266.5 Weight today: 267  Samples given per MNT protocol. Patient educated on appropriate usage:  procare Multivitamin Lot # [id number] Exp:06/26   Procare Calcium  Lot # [id number] Exp: 09/06/2023  The following the learning objectives were met by the patient during this course: Identify Pre-Op Dietary Goals and will begin 2 weeks pre-operatively Identify appropriate sources of fluids and proteins  State protein recommendations and appropriate sources pre and post-operatively Identify Post-Operative Dietary Goals and will follow for 2 weeks post-operatively Identify appropriate multivitamin and calcium sources Describe the need for physical activity post-operatively and will follow MD recommendations State when to call healthcare provider regarding medication questions or post-operative complications When having a diagnosis of diabetes understanding hypoglycemia symptoms and the inclusion of 1 complex carbohydrate per meal  Handouts given during class include: Pre-Op Bariatric Surgery Diet Handout Protein Shake Handout Post-Op Bariatric Surgery Nutrition Handout BELT Program Information Flyer Support Group Information Flyer [facility] Bariatric Supplements Price List  Follow-Up Plan: Patient will follow-up at [facility] 2 weeks post operatively for diet advancement per MD.

## 2023-03-16 ENCOUNTER — Ambulatory Visit: Payer: Self-pay | Admitting: Surgery

## 2023-03-16 DIAGNOSIS — F4310 Post-traumatic stress disorder, unspecified: Secondary | ICD-10-CM | POA: Diagnosis not present

## 2023-03-16 DIAGNOSIS — K219 Gastro-esophageal reflux disease without esophagitis: Secondary | ICD-10-CM | POA: Diagnosis not present

## 2023-03-16 NOTE — H&P (View-Only) (Signed)
Betel Bertola [id number]  Referring Provider: Self CC: obesity  History of Present Illness:  Returns for follow up having completed the bariatric pathway; no barriers identified.  Cleared by dietician and psychologist. Has completed preop nutrition class. TIBC mildly low, TG elevated along with VLDL but labs otherwise unremarkable. Upper GI was negative. She is extremely well-prepared at this point.  She has already gone over her medications and will be discussing whether she needs to do any alternative formularies with her psychiatrist.  Has her PAT appointment set up.  Has a few questions to discuss today.   Initial visit 11/26/22: 47 year old woman with history of anxiety, back pain, postop nausea vomiting, depression, fibroids, gastroparesis, GERD, headache, hypertriglyceridemia, pneumonia, prediabetes, PTSD, sleep apnea not currently using CPAP, vitamin D deficiency who presents for consultation regarding surgical treatment of morbid obesity. She has struggled with this for many years, and has actually been considering surgical treatment for about 10 years. 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 She has previously tried [place] and was able to lose about 40 pounds last year however her weight loss plateaued after this. Her insurance is now no longer approving this medication. Has also tried weight watchers, various diets, exercise programs, [facility], all with minimal and transient success. She does endorse significant reflux, she takes daily medication and does occasionally have breakthrough symptoms including nighttime regurgitation/aspiration. In her chart history of gastroparesis is listed, but the patient does not have any symptoms of this.  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 Quit smoking many years ago. She has a 16 year old son. She does have a significant history of past trauma and is well-informed and connected with a trauma therapist and psychiatrist whom she follows closely with.  CT abdomen pelvis 09/17/2020, no intra-abdominal pathology, noticed tiny fat-containing umbilical hernia Labs from November 2023 included unremarkable CMP and CBC, hemoglobin A1c of 5.5, TSH of 1.34 Chest x-ray - 07/24/2022 EKG-08/05/2022-normal sinus rhythm, was seen by Dr. Wyline Mood after ER visit for palpitations and found to have PVCs Echo 08/28/2022-normal Colonoscopy 11/16/2022, Dr. Staci Righter adenoma in the cecum and transverse colon  Review of Systems: A complete review of systems was obtained from the patient. I have reviewed this information and discussed as appropriate with the patient. See HPI as well for other ROS.  Medical History: Past Medical History: Diagnosis Date Anxiety Arthritis GERD (gastroesophageal reflux disease)  There is no problem list on file for this patient.  Past Surgical History: Procedure Laterality Date ankle surgery CHOLECYSTECTOMY foot surgery HYSTERECTOMY right knee surgery C-section  Allergies Allergen Reactions Tamiflu [Oseltamivir] Anaphylaxis Bacitracin Unknown Blisters Cephalosporins Rash Latex Rash Penicillin Hives and Swelling  Current Outpatient Medications on File Prior to Visit Medication Sig Dispense Refill biotin 5 mg Tab Take by mouth cetirizine (ZYRTEC) 10 MG tablet Take 10 mg by mouth as needed cholecalciferol (VITAMIN D3) 5,000 unit capsule Take by mouth docusate (COLACE) 100 MG capsule Take 100 mg by mouth 2 (two) times daily esomeprazole (NEXIUM) 20 MG DR capsule Take 20 mg by mouth at bedtime fluvoxaMINE (LUVOX) 100 MG tablet Take by  mouth gabapentin (NEURONTIN) 300 MG capsule Take by mouth prazosin (MINIPRESS) 5 MG capsule Take by mouth propranoloL (INDERAL) 40  MG tablet Take 40 mg twice a day May take a extra 40 mg in pm if needed QUEtiapine (SEROQUEL) 200 MG tablet Take by mouth sodium, potassium, and magnesium (SUPREP) oral solution WEGOVY 2.4 mg/0.75 mL pen injector Inject subcutaneously  No current facility-administered medications on file prior to visit. Also reports Klonopin 1.5 mg nightly and 0.5 mg as needed, multivitamin, Zofran 8 mg every 8 hours as needed, and magtein 4 caps nightly  Family History Problem Relation Age of Onset Skin cancer Mother Obesity Mother Skin cancer Father High blood pressure (Hypertension) Father Hyperlipidemia (Elevated cholesterol) Father Coronary Artery Disease (Blocked arteries around heart) Father Also endorses a history of Barrett's esophagus in her sibling and uncle, and another family member who had esophageal cancer  Social History  Tobacco Use Smoking Status Former Types: Cigarettes Smokeless Tobacco Never   Social History  Socioeconomic History Marital status: Married Tobacco Use Smoking status: Former Types: Cigarettes Smokeless tobacco: Never Vaping Use Vaping Use: Never used Substance and Sexual Activity Alcohol use: Not Currently Drug use: Never  Objective:  Vitals: 11/26/22 0932 BP: 116/77 Pulse: 80 Temp: 36.6 C (97.9 F) SpO2: 99% Weight: (!) 120.2 kg (265 lb) Height: 165.1 cm (5\' 5" ) PainSc: 0-No pain  Body mass index is 44.1 kg/m.  Alert and well-appearing Unlabored respirations  Assessment and Plan: Diagnoses and all orders for this visit:  Morbid obesity (CMS-HCC) - Lipid Panel - [facility] - H. pylori Breath Test - [facility] - Iron and Total Iron Binding Capacity (TIBC); Future - Vitamin B12 - [facility] - Folate; Future - Prothrombin Time (INR); Future - Vitamin D, 25-Hydroxy - [facility] - Ambulatory Referral to  Nutrition - Ambulatory Referral to Adult Behavioral Health - X-ray upper GI  Gastroesophageal reflux disease, unspecified whether esophagitis present   She remains a good candidate for Roux-en-Y gastric bypass. Given her history of reflux and family history of Barrett's/esophageal cancer we discussed that the 10% risk of worsening reflux after sleeve may be prohibitive for her. We have previously discussed the surgery including relevant anatomy using diagram to demonstrate, technical aspects, the risks of bleeding, infection, pain, scarring, injury to intra-abdominal structures, staple line anastomotic leak, stricture or abscess, chronic abdominal pain or nausea, ulcer, internal hernia, malabsorption/malnutrition and vitamin deficiency, DVT/PE, pneumonia, heart attack, stroke, death, failure to reach weight loss goals and weight regain, hernia. Also went over risk of exacerbation or worsening of mental health issues and the need for continued close follow-up with her psychiatry and psychology team, which she is well-prepared to do. Reviewed the typical peri-, and postoperative course. Discussed the importance of lifelong behavioral changes to combat the chronic and relapsing disease which is obesity. Questions were welcomed and answered to her satisfaction. Proceed with laparoscopic Roux-en-Y gastric bypass.  CHELSEA Carlye Grippe, MD

## 2023-03-16 NOTE — H&P (Signed)
Betel Bertola [id number]  Referring Provider: Self CC: obesity  History of Present Illness:  Returns for follow up having completed the bariatric pathway; no barriers identified.  Cleared by dietician and psychologist. Has completed preop nutrition class. TIBC mildly low, TG elevated along with VLDL but labs otherwise unremarkable. Upper GI was negative. She is extremely well-prepared at this point.  She has already gone over her medications and will be discussing whether she needs to do any alternative formularies with her psychiatrist.  Has her PAT appointment set up.  Has a few questions to discuss today.   Initial visit 11/26/22: 47 year old woman with history of anxiety, back pain, postop nausea vomiting, depression, fibroids, gastroparesis, GERD, headache, hypertriglyceridemia, pneumonia, prediabetes, PTSD, sleep apnea not currently using CPAP, vitamin D deficiency who presents for consultation regarding surgical treatment of morbid obesity. She has struggled with this for many years, and has actually been considering surgical treatment for about 10 years. 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 She has previously tried [place] and was able to lose about 40 pounds last year however her weight loss plateaued after this. Her insurance is now no longer approving this medication. Has also tried weight watchers, various diets, exercise programs, [facility], all with minimal and transient success. She does endorse significant reflux, she takes daily medication and does occasionally have breakthrough symptoms including nighttime regurgitation/aspiration. In her chart history of gastroparesis is listed, but the patient does not have any symptoms of this.  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 Quit smoking many years ago. She has a 16 year old son. She does have a significant history of past trauma and is well-informed and connected with a trauma therapist and psychiatrist whom she follows closely with.  CT abdomen pelvis 09/17/2020, no intra-abdominal pathology, noticed tiny fat-containing umbilical hernia Labs from November 2023 included unremarkable CMP and CBC, hemoglobin A1c of 5.5, TSH of 1.34 Chest x-ray - 07/24/2022 EKG-08/05/2022-normal sinus rhythm, was seen by Dr. Wyline Mood after ER visit for palpitations and found to have PVCs Echo 08/28/2022-normal Colonoscopy 11/16/2022, Dr. Staci Righter adenoma in the cecum and transverse colon  Review of Systems: A complete review of systems was obtained from the patient. I have reviewed this information and discussed as appropriate with the patient. See HPI as well for other ROS.  Medical History: Past Medical History: Diagnosis Date Anxiety Arthritis GERD (gastroesophageal reflux disease)  There is no problem list on file for this patient.  Past Surgical History: Procedure Laterality Date ankle surgery CHOLECYSTECTOMY foot surgery HYSTERECTOMY right knee surgery C-section  Allergies Allergen Reactions Tamiflu [Oseltamivir] Anaphylaxis Bacitracin Unknown Blisters Cephalosporins Rash Latex Rash Penicillin Hives and Swelling  Current Outpatient Medications on File Prior to Visit Medication Sig Dispense Refill biotin 5 mg Tab Take by mouth cetirizine (ZYRTEC) 10 MG tablet Take 10 mg by mouth as needed cholecalciferol (VITAMIN D3) 5,000 unit capsule Take by mouth docusate (COLACE) 100 MG capsule Take 100 mg by mouth 2 (two) times daily esomeprazole (NEXIUM) 20 MG DR capsule Take 20 mg by mouth at bedtime fluvoxaMINE (LUVOX) 100 MG tablet Take by  mouth gabapentin (NEURONTIN) 300 MG capsule Take by mouth prazosin (MINIPRESS) 5 MG capsule Take by mouth propranoloL (INDERAL) 40  MG tablet Take 40 mg twice a day May take a extra 40 mg in pm if needed QUEtiapine (SEROQUEL) 200 MG tablet Take by mouth sodium, potassium, and magnesium (SUPREP) oral solution WEGOVY 2.4 mg/0.75 mL pen injector Inject subcutaneously  No current facility-administered medications on file prior to visit. Also reports Klonopin 1.5 mg nightly and 0.5 mg as needed, multivitamin, Zofran 8 mg every 8 hours as needed, and magtein 4 caps nightly  Family History Problem Relation Age of Onset Skin cancer Mother Obesity Mother Skin cancer Father High blood pressure (Hypertension) Father Hyperlipidemia (Elevated cholesterol) Father Coronary Artery Disease (Blocked arteries around heart) Father Also endorses a history of Barrett's esophagus in her sibling and uncle, and another family member who had esophageal cancer  Social History  Tobacco Use Smoking Status Former Types: Cigarettes Smokeless Tobacco Never   Social History  Socioeconomic History Marital status: Married Tobacco Use Smoking status: Former Types: Cigarettes Smokeless tobacco: Never Vaping Use Vaping Use: Never used Substance and Sexual Activity Alcohol use: Not Currently Drug use: Never  Objective:  Vitals: 11/26/22 0932 BP: 116/77 Pulse: 80 Temp: 36.6 C (97.9 F) SpO2: 99% Weight: (!) 120.2 kg (265 lb) Height: 165.1 cm (5\' 5" ) PainSc: 0-No pain  Body mass index is 44.1 kg/m.  Alert and well-appearing Unlabored respirations  Assessment and Plan: Diagnoses and all orders for this visit:  Morbid obesity (CMS-HCC) - Lipid Panel - [facility] - H. pylori Breath Test - [facility] - Iron and Total Iron Binding Capacity (TIBC); Future - Vitamin B12 - [facility] - Folate; Future - Prothrombin Time (INR); Future - Vitamin D, 25-Hydroxy - [facility] - Ambulatory Referral to  Nutrition - Ambulatory Referral to Adult Behavioral Health - X-ray upper GI  Gastroesophageal reflux disease, unspecified whether esophagitis present   She remains a good candidate for Roux-en-Y gastric bypass. Given her history of reflux and family history of Barrett's/esophageal cancer we discussed that the 10% risk of worsening reflux after sleeve may be prohibitive for her. We have previously discussed the surgery including relevant anatomy using diagram to demonstrate, technical aspects, the risks of bleeding, infection, pain, scarring, injury to intra-abdominal structures, staple line anastomotic leak, stricture or abscess, chronic abdominal pain or nausea, ulcer, internal hernia, malabsorption/malnutrition and vitamin deficiency, DVT/PE, pneumonia, heart attack, stroke, death, failure to reach weight loss goals and weight regain, hernia. Also went over risk of exacerbation or worsening of mental health issues and the need for continued close follow-up with her psychiatry and psychology team, which she is well-prepared to do. Reviewed the typical peri-, and postoperative course. Discussed the importance of lifelong behavioral changes to combat the chronic and relapsing disease which is obesity. Questions were welcomed and answered to her satisfaction. Proceed with laparoscopic Roux-en-Y gastric bypass.  [place]ult[place] Merry Carlye Grippe, MD

## 2023-03-24 ENCOUNTER — Ambulatory Visit: Payer: 59 | Admitting: Physician Assistant

## 2023-03-24 ENCOUNTER — Encounter: Payer: Self-pay | Admitting: Physician Assistant

## 2023-03-24 ENCOUNTER — Other Ambulatory Visit: Payer: Self-pay | Admitting: Oncology

## 2023-03-24 ENCOUNTER — Other Ambulatory Visit (HOSPITAL_COMMUNITY): Payer: Self-pay

## 2023-03-24 VITALS — BP 133/82 | HR 84

## 2023-03-24 DIAGNOSIS — G4733 Obstructive sleep apnea (adult) (pediatric): Secondary | ICD-10-CM

## 2023-03-24 DIAGNOSIS — Z006 Encounter for examination for normal comparison and control in clinical research program: Secondary | ICD-10-CM

## 2023-03-24 DIAGNOSIS — F4310 Post-traumatic stress disorder, unspecified: Secondary | ICD-10-CM | POA: Diagnosis not present

## 2023-03-24 DIAGNOSIS — F3341 Major depressive disorder, recurrent, in partial remission: Secondary | ICD-10-CM | POA: Diagnosis not present

## 2023-03-24 DIAGNOSIS — F411 Generalized anxiety disorder: Secondary | ICD-10-CM | POA: Diagnosis not present

## 2023-03-24 DIAGNOSIS — E6609 Other obesity due to excess calories: Secondary | ICD-10-CM | POA: Diagnosis not present

## 2023-03-24 DIAGNOSIS — G4700 Insomnia, unspecified: Secondary | ICD-10-CM

## 2023-03-24 MED ORDER — CLONAZEPAM 0.5 MG PO TABS
0.5000 mg | ORAL_TABLET | Freq: Four times a day (QID) | ORAL | 5 refills | Status: DC | PRN
  Filled 2023-03-24 – 2023-04-19 (×2): qty 120, 30d supply, fill #0
  Filled 2023-06-01: qty 120, 30d supply, fill #1
  Filled 2023-06-15 – 2023-07-12 (×2): qty 120, 30d supply, fill #2
  Filled 2023-08-09: qty 120, 30d supply, fill #3
  Filled 2023-09-15: qty 120, 30d supply, fill #4

## 2023-03-24 NOTE — Progress Notes (Signed)
[facility]  Patient ID: GWENETTE WELLONS,  MRN: [id number]  PCP: Tollie Eth, NP  Date of Evaluation: 03/24/2023 Time spent:30 minutes  Chief Complaint:  Chief Complaint   Anxiety; Depression; Follow-up    HISTORY/CURRENT STATUS: HPI for routine 57-month follow-up.  Will have gastric bypass in a couple of weeks. Needs to discuss mental health meds, how she needs to take because she will not be able to swallow pills for quite a while.  The surgeon has told her that she can crush them and put in a teaspoon of applesauce or something.  Latoiya wants to know if that is okay.  She is doing really well as far as her mental health goes.  Feels like her medications are working well. Patient is able to enjoy things.  Energy and motivation are good.  Work is going well.   No extreme sadness, tearfulness, or feelings of hopelessness.  Sleeps well most of the time. No nightmares reported.  ADLs and personal hygiene are normal.   Denies any changes in concentration, making decisions, or remembering things.  Appetite has not changed.  Weight is stable.  There has been some family stress recently but she is able to cope, using her skills learned in therapy and by previous life experiences.  Does take the Klonopin as needed and it is effective.  Denies suicidal or homicidal thoughts.  Patient denies increased energy with decreased need for sleep, increased talkativeness, racing thoughts, impulsivity or risky behaviors, increased spending, increased libido, grandiosity, increased irritability or anger, paranoia, or hallucinations.  Denies dizziness, syncope, seizures, numbness, tingling, tremor, tics, unsteady gait, slurred speech, confusion. Denies muscle or joint pain, stiffness, or dystonia. Denies unexplained weight loss, frequent infections, or sores that heal slowly.  No polyphagia, polydipsia, or polyuria. Denies visual changes or paresthesias.   Individual Medical History/ Review of  Systems: Changes? :Yes    will have gastric bypass in 2 weeks.  Past medications for mental health diagnoses include: Prozac, Paxil, Effexor, Norpramin, Wellbutrin, doxepin, Elavil, Geodon, Abilify, Lexapro, Trileptal, Ambien, Lunesta, Sonata, trazodone, Restoril, Remeron, Seroquel, Klonopin, propranolol, Lamictal, Deplin, Latuda, Xanax made her feel horrible, Ativan didn't help, prazosin    Allergies: Bacitracin, Penicillins, Tamiflu [oseltamivir phosphate], Cephalosporins, and Latex  Current Medications:  Current Outpatient Medications:    Biotin 5 MG TABS, Take by mouth., Disp: , Rfl:    cetirizine (ZYRTEC) 10 MG tablet, Take 10 mg by mouth as needed for allergies., Disp: , Rfl:    Cholecalciferol (VITAMIN D3) 5000 units CAPS, Take 5,000 Units by mouth at bedtime., Disp: , Rfl:    esomeprazole (NEXIUM) 20 MG capsule, Take 20 mg by mouth at bedtime., Disp: , Rfl:    fluvoxaMINE (LUVOX) 100 MG tablet, Take 1 tablet by mouth at bedtime. Take with 50 mg dose to equal 150 mg at bedtime, Disp: 90 tablet, Rfl: 3   fluvoxaMINE (LUVOX) 50 MG tablet, TAKE 1 TABLET BY MOUTH AT BEDTIME. TAKE IN ADDITION TO THE 100 MG TABLET TO EQUAL 150 MG TOTAL, Disp: 90 tablet, Rfl: 3   gabapentin (NEURONTIN) 300 MG capsule, Take 3 capsules (900 mg total) by mouth at bedtime., Disp: 270 capsule, Rfl: 3   Multiple Vitamin (MULTIVITAMIN) capsule, Take 1 capsule by mouth daily., Disp: , Rfl:    ondansetron (ZOFRAN-ODT) 8 MG disintegrating tablet, Dissolve 1 tablet by mouth every 8 hours as needed for nausea., Disp: 30 tablet, Rfl: 11   prazosin (MINIPRESS) 5 MG capsule, Take 2 capsules (10 mg  total) by mouth at bedtime., Disp: 180 capsule, Rfl: 3   propranolol (INDERAL) 40 MG tablet, Take 1 tablet by mouth twice a day --May take an extra tablet in evening if needed, Disp: 270 tablet, Rfl: 3   QUEtiapine (SEROQUEL) 200 MG tablet, Take 0.5-1 tablets (100-200 mg total) by mouth at bedtime., Disp: 90 tablet, Rfl: 3    QUEtiapine (SEROQUEL) 300 MG tablet, Take 1 tablet by mouth at bedtime., Disp: 90 tablet, Rfl: 3   triamcinolone (NASACORT) 55 MCG/ACT AERO nasal inhaler, Place 1 spray into the nose at bedtime., Disp: , Rfl:    UNABLE TO FIND, Magtein qhs, Disp: , Rfl:    clonazePAM (KLONOPIN) 0.5 MG tablet, Take 1 tablet (0.5 mg total) by mouth every 6 (six) hours as needed for anxiety. 3 daily at bedtime, Disp: 120 tablet, Rfl: 5   metFORMIN (GLUCOPHAGE-XR) 500 MG 24 hr tablet, Take 1 tablet by mouth with supper daily for 7 days then increase to 2 tablets by mouth with supper daily . (Patient not taking: Reported on 03/24/2023), Disp: 60 tablet, Rfl: 1   Semaglutide-Weight Management (WEGOVY) 2.4 MG/0.75ML SOAJ, Inject 2.4 mg into the skin once a week. (Patient not taking: Reported on 03/24/2023), Disp: 9 mL, Rfl: 3 Medication Side Effects: none  Family Medical/ Social History: Changes?  Son came out to his dad.  MENTAL HEALTH EXAM:  Blood pressure 133/82, pulse 84, last menstrual period 01/06/2021.There is no height or weight on file to calculate BMI.  General Appearance: Casual, Well Groomed, and Obese  Eye Contact:  Good  Speech:  Clear and Coherent and Normal Rate  Volume:  Normal  Mood:  Euthymic  Affect:  Appropriate  Thought Process:  Goal Directed and Descriptions of Associations: Circumstantial  Orientation:  Full (Time, Place, and Person)  Thought Content: Logical   Suicidal Thoughts:  No  Homicidal Thoughts:  No  Memory:  WNL  Judgement:  Good  Insight:  Good  Psychomotor Activity:  Normal  Concentration:  Concentration: Good and Attention Span: Good  Recall:  Good  Fund of Knowledge: Good  Language: Good  Assets:  Desire for Improvement Financial Resources/Insurance Housing Resilience Transportation Vocational/Educational  ADL's:  Intact  Cognition: WNL  Prognosis:  Good   Labs under Care Everywhere 12/14/2022 Total cholesterol 149, triglycerides 357, HDL 33, LDL 61. Vitamin D  level 67.3 No CMP on chart. Will have preop labs later.  DIAGNOSES:    ICD-10-CM   1. Recurrent major depressive disorder, in partial remission (HCC)  F33.41     2. Generalized anxiety disorder  F41.1     3. Obstructive sleep apnea  G47.33     4. PTSD (post-traumatic stress disorder)  F43.10     5. Insomnia, unspecified type  G47.00     6. Obesity due to excess calories, unspecified classification, unspecified whether serious comorbidity present  E66.09       Receiving Psychotherapy: Yes  Heather Mask  RECOMMENDATIONS:  PDMP was reviewed.  Last Klonopin filled 03/12/2023. I provided 30 minutes of face to face time during this encounter, including time spent before and after the visit in records review, medical decision making, counseling pertinent to today's visit, and charting.   We discussed her upcoming gastric bypass.  It is completely fine for her to crush the medications that I prescribe.   We agree to leave all of her medications the same for now.  However if we need to, we can change dosing of Seroquel down to  400 mg if she is allowed only 1 pill (when she is able to take a pill without needing it to be crushed) but for now neither of [name] want to make any changes because she is doing so well.  Note to surgeon-when she is inpatient, please give the Klonopin 0.5 mg, 3 po at bedtime routinely. Thank you.   Continue Klonopin 0.5 mg, 1 p.o. 4 times daily as needed. (She takes 3 at bedtime routinely, and 1 every day prn.  Continue Luvox 150 mg nightly. Continue gabapentin 300 mg, 3 p.o. q. evening. Continue prazosin 5 mg, 2 po qhs.  Continue propranolol 40 mg, 1 p.o. bid.  Continue Seroquel 500 mg nightly. Continue counseling with Heather Mask. Return in 2 months.  Melony Overly, PA-C

## 2023-03-25 DIAGNOSIS — F4310 Post-traumatic stress disorder, unspecified: Secondary | ICD-10-CM | POA: Diagnosis not present

## 2023-03-30 NOTE — Progress Notes (Signed)
COVID Vaccine Completed: yes  Date of COVID positive in last 90 days:  PCP - Huntley Dec early, NP Cardiologist -  Carolan Clines, MD LOV 08/05/22  Chest x-ray - 01/02/23 Epic EKG - 07/16/22 Epic Stress Test -  ECHO - 08/28/22 Epic Cardiac Cath -  Pacemaker/ICD device last checked: Spinal Cord Stimulator:  Bowel Prep -   Sleep Study -  CPAP -   Fasting Blood Sugar - preDM Checks Blood Sugar _____ times a day  Last dose of GLP1 agonist-  [place] GLP1 instructions:     Last dose of SGLT-2 inhibitors-  N/A SGLT-2 instructions: N/A   Blood Thinner Instructions:  Time Aspirin Instructions: Last Dose:  Activity level:  Can go up a flight of stairs and perform activities of daily living without stopping and without symptoms of chest pain or shortness of breath.  Able to exercise without symptoms  Unable to go up a flight of stairs without symptoms of     Anesthesia review:   Patient denies shortness of breath, fever, cough and chest pain at PAT appointment  Patient verbalized understanding of instructions that were given to them at the PAT appointment. Patient was also instructed that they will need to review over the PAT instructions again at home before surgery.

## 2023-03-30 NOTE — Patient Instructions (Signed)
SURGICAL WAITING ROOM VISITATION  Patients having surgery or a procedure may have no more than 2 support people in the waiting area - these visitors may rotate.    Children under the age of 50 must have an adult with them who is not the patient.  Due to an increase in RSV and influenza rates and associated hospitalizations, children ages 35 and under may not visit patients in [facility] hospitals.  If the patient needs to stay at the hospital during part of their recovery, the visitor guidelines for inpatient rooms apply. Pre-op nurse will coordinate an appropriate time for 1 support person to accompany patient in pre-op.  This support person may not rotate.    Please refer to the [facility] website for the visitor guidelines for Inpatients (after your surgery is over and you are in a regular room).    Your procedure is scheduled on: 04/13/23   Report to [facility] Main Entrance    Report to admitting at 5:15 AM   Call this number if you have problems the morning of surgery [phone]   MORNING OF SURGERY DRINK:   DRINK 1 G2 drink BEFORE YOU LEAVE HOME, DRINK ALL OF THE  G2 DRINK AT ONE TIME.   NO SOLID FOOD AFTER 600 PM THE NIGHT BEFORE YOUR SURGERY. YOU MAY DRINK CLEAR FLUIDS. THE G2 DRINK YOU DRINK BEFORE YOU LEAVE HOME WILL BE THE LAST FLUIDS YOU DRINK BEFORE SURGERY.  PAIN IS EXPECTED AFTER SURGERY AND WILL NOT BE COMPLETELY ELIMINATED. AMBULATION AND TYLENOL WILL HELP REDUCE INCISIONAL AND GAS PAIN. MOVEMENT IS KEY!  YOU ARE EXPECTED TO BE OUT OF BED WITHIN 4 HOURS OF ADMISSION TO YOUR PATIENT ROOM.  SITTING IN THE RECLINER THROUGHOUT THE DAY IS IMPORTANT FOR DRINKING FLUIDS AND MOVING GAS THROUGHOUT THE GI TRACT.  COMPRESSION STOCKINGS SHOULD BE WORN [facility] STAY UNLESS YOU ARE WALKING.   INCENTIVE SPIROMETER SHOULD BE USED EVERY HOUR WHILE AWAKE TO DECREASE POST-OPERATIVE COMPLICATIONS SUCH AS PNEUMONIA.  WHEN DISCHARGED HOME, IT IS IMPORTANT TO  CONTINUE TO WALK EVERY HOUR AND USE THE INCENTIVE SPIROMETER EVERY HOUR.    After Midnight you may have the following liquids until 4:30 AM DAY OF SURGERY  Water Non-Citrus Juices (without pulp, NO RED-Apple, White grape, White cranberry) Black Coffee (NO MILK/CREAM OR CREAMERS, sugar ok)  Clear Tea (NO MILK/CREAM OR CREAMERS, sugar ok) regular and decaf                             Plain Jell-O (NO RED)                                           Fruit ices (not with fruit pulp, NO RED)                                     Popsicles (NO RED)                                                               Sports drinks like  Gatorade (NO RED)                 The day of surgery:  Drink ONE (1) Pre-Surgery G2 at 4:30 AM the morning of surgery. Drink in one sitting. Do not sip.  This drink was given to you during your hospital  pre-op appointment visit. Nothing else to drink after completing the  Pre-Surgery G2.          If you have questions, please contact your surgeon's office.   FOLLOW BOWEL PREP AND ANY ADDITIONAL PRE OP INSTRUCTIONS YOU RECEIVED FROM YOUR SURGEON'S OFFICE!!!     Oral Hygiene is also important to reduce your risk of infection.                                    Remember - BRUSH YOUR TEETH THE MORNING OF SURGERY WITH YOUR REGULAR TOOTHPASTE  DENTURES WILL BE REMOVED PRIOR TO SURGERY PLEASE DO NOT APPLY "Poly grip" OR ADHESIVES!!!   Take these medicines the morning of surgery with A SIP OF WATER: Clonazepam, Zofran, Propranolol                               You may not have any metal on your body including hair pins, jewelry, and body piercing             Do not wear make-up, lotions, powders, perfume, or deodorant  Do not wear nail polish including gel and S&S, artificial/acrylic nails, or any other type of covering on natural nails including finger and toenails. If you have artificial nails, gel coating, etc. that needs to be removed by a nail salon please have this  removed prior to surgery or surgery may need to be canceled/ delayed if the surgeon/ anesthesia feels like they are unable to be safely monitored.   Do not shave  48 hours prior to surgery.    Do not bring valuables to the hospital. [place] IS NOT             RESPONSIBLE   FOR VALUABLES.   Contacts, glasses, dentures or bridgework may not be worn into surgery.   Bring small overnight bag day of surgery.   DO NOT BRING YOUR HOME MEDICATIONS TO THE HOSPITAL. PHARMACY WILL DISPENSE MEDICATIONS LISTED ON YOUR MEDICATION LIST TO YOU DURING YOUR ADMISSION IN THE HOSPITAL!              Please read over the following fact sheets you were given: IF YOU HAVE QUESTIONS ABOUT YOUR PRE-OP INSTRUCTIONS PLEASE CALL [name]    If you received a COVID test during your pre-op visit  it is requested that you wear a mask when out in public, stay away from anyone that may not be feeling well and notify your surgeon if you develop symptoms. If you test positive for Covid or have been in contact with anyone that has tested positive in the last 10 days please notify you surgeon.    [name] - Preparing for Surgery Before surgery, you can play an important role.  Because skin is not sterile, your skin needs to be as free of germs as possible.  You can reduce the number of germs on your skin by washing with CHG (chlorahexidine gluconate) soap before surgery.  CHG is an antiseptic cleaner which kills germs and bonds with the  skin to continue killing germs even after washing. Please DO NOT use if you have an allergy to CHG or antibacterial soaps.  If your skin becomes reddened/irritated stop using the CHG and inform your nurse when you arrive at Short Stay. Do not shave (including legs and underarms) for at least 48 hours prior to the first CHG shower.  You may shave your face/neck.  Please follow these instructions carefully:  1.  Shower with CHG Soap the night before surgery and the  morning of  surgery.  2.  If you choose to wash your hair, wash your hair first as usual with your normal  shampoo.  3.  After you shampoo, rinse your hair and body thoroughly to remove the shampoo.                             4.  Use CHG as you would any other liquid soap.  You can apply chg directly to the skin and wash.  Gently with a scrungie or clean washcloth.  5.  Apply the CHG Soap to your body ONLY FROM THE NECK DOWN.   Do   not use on face/ open                           Wound or open sores. Avoid contact with eyes, ears mouth and   genitals (private parts).                       Wash face,  Genitals (private parts) with your normal soap.             6.  Wash thoroughly, paying special attention to the area where your    surgery  will be performed.  7.  Thoroughly rinse your body with warm water from the neck down.  8.  DO NOT shower/wash with your normal soap after using and rinsing off the CHG Soap.                9.  Pat yourself dry with a clean towel.            10.  Wear clean pajamas.            11.  Place clean sheets on your bed the night of your first shower and do not  sleep with pets. Day of Surgery : Do not apply any lotions/deodorants the morning of surgery.  Please wear clean clothes to the hospital/surgery center.  FAILURE TO FOLLOW THESE INSTRUCTIONS MAY RESULT IN THE CANCELLATION OF YOUR SURGERY  PATIENT SIGNATURE_________________________________  NURSE SIGNATURE__________________________________  ________________________________________________________________________

## 2023-03-31 ENCOUNTER — Other Ambulatory Visit: Payer: Self-pay

## 2023-03-31 ENCOUNTER — Encounter (HOSPITAL_COMMUNITY): Payer: Self-pay

## 2023-03-31 ENCOUNTER — Encounter (HOSPITAL_COMMUNITY)
Admission: RE | Admit: 2023-03-31 | Discharge: 2023-03-31 | Disposition: A | Discharge status: Home / Self Care | Payer: 59 | Source: Ambulatory Visit | Attending: Surgery | Admitting: Surgery

## 2023-03-31 DIAGNOSIS — Z01812 Encounter for preprocedural laboratory examination: Secondary | ICD-10-CM | POA: Insufficient documentation

## 2023-03-31 LAB — COMPREHENSIVE METABOLIC PANEL
ALT: 22 U/L (ref 0–44)
AST: 26 U/L (ref 15–41)
Albumin: 3.6 g/dL (ref 3.5–5.0)
Alkaline Phosphatase: 80 U/L (ref 38–126)
Anion gap: 8 (ref 5–15)
BUN: 21 mg/dL — ABNORMAL HIGH (ref 6–20)
CO2: 27 mmol/L (ref 22–32)
Calcium: 9.1 mg/dL (ref 8.9–10.3)
Chloride: 102 mmol/L (ref 98–111)
Creatinine, Ser: 0.98 mg/dL (ref 0.44–1.00)
GFR, Estimated: >60 mL/min (ref >60)
Glucose, Bld: 102 mg/dL — ABNORMAL HIGH (ref 70–99)
Potassium: 4 mmol/L (ref 3.5–5.1)
Sodium: 137 mmol/L (ref 135–145)
Total Bilirubin: 0.4 mg/dL (ref 0.3–1.2)
Total Protein: 7.7 g/dL (ref 6.5–8.1)

## 2023-03-31 LAB — CBC WITH DIFFERENTIAL/PLATELET
Abs Immature Granulocytes: 0.03 10*3/uL (ref 0.00–0.07)
Basophils Absolute: 0 10*3/uL (ref 0.0–0.1)
Basophils Relative: 1 %
Eosinophils Absolute: 0.1 10*3/uL (ref 0.0–0.5)
Eosinophils Relative: 2 %
HCT: 37.4 % (ref 36.0–46.0)
Hemoglobin: 12.2 g/dL (ref 12.0–15.0)
Immature Granulocytes: 0 %
Lymphocytes Relative: 25 %
Lymphs Abs: 1.8 10*3/uL (ref 0.7–4.0)
MCH: 28.7 pg (ref 26.0–34.0)
MCHC: 32.6 g/dL (ref 30.0–36.0)
MCV: 88 fL (ref 80.0–100.0)
Monocytes Absolute: 0.6 10*3/uL (ref 0.1–1.0)
Monocytes Relative: 8 %
Neutro Abs: 4.6 10*3/uL (ref 1.7–7.7)
Neutrophils Relative %: 64 %
Platelets: 268 10*3/uL (ref 150–400)
RBC: 4.25 MIL/uL (ref 3.87–5.11)
RDW: 14.6 % (ref 11.5–15.5)
WBC: 7.3 10*3/uL (ref 4.0–10.5)
nRBC: 0 % (ref 0.0–0.2)

## 2023-03-31 LAB — TYPE AND SCREEN
ABO/RH(D): A POS
Antibody Screen: NEGATIVE

## 2023-04-02 DIAGNOSIS — F4310 Post-traumatic stress disorder, unspecified: Secondary | ICD-10-CM | POA: Diagnosis not present

## 2023-04-06 DIAGNOSIS — F4310 Post-traumatic stress disorder, unspecified: Secondary | ICD-10-CM | POA: Diagnosis not present

## 2023-04-09 ENCOUNTER — Other Ambulatory Visit (HOSPITAL_COMMUNITY)
Admission: RE | Admit: 2023-04-09 | Discharge: 2023-04-09 | Disposition: A | Discharge status: Home / Self Care | Payer: 59 | Source: Ambulatory Visit | Attending: Oncology | Admitting: Oncology

## 2023-04-09 DIAGNOSIS — Z006 Encounter for examination for normal comparison and control in clinical research program: Secondary | ICD-10-CM | POA: Insufficient documentation

## 2023-04-12 MED ORDER — GENTAMICIN SULFATE 40 MG/ML IJ SOLN
5.0000 mg/kg | INTRAVENOUS | Status: AC
  Administered 2023-04-13: 410 mg via INTRAVENOUS
  Filled 2023-04-12: qty 10.25

## 2023-04-13 ENCOUNTER — Other Ambulatory Visit: Payer: Self-pay

## 2023-04-13 ENCOUNTER — Inpatient Hospital Stay (HOSPITAL_COMMUNITY): Payer: 59 | Admitting: Physician Assistant

## 2023-04-13 ENCOUNTER — Encounter (HOSPITAL_COMMUNITY): Payer: Self-pay | Admitting: Surgery

## 2023-04-13 ENCOUNTER — Inpatient Hospital Stay (HOSPITAL_COMMUNITY)
Admission: RE | Admit: 2023-04-13 | Discharge: 2023-04-14 | DRG: 621 | Disposition: A | Discharge status: Home / Self Care | Payer: 59 | Attending: Surgery | Admitting: Surgery

## 2023-04-13 ENCOUNTER — Encounter (HOSPITAL_COMMUNITY)
Admission: RE | Disposition: A | Discharge status: Home / Self Care | Payer: Self-pay | Source: Home / Self Care | Attending: Surgery

## 2023-04-13 ENCOUNTER — Inpatient Hospital Stay (HOSPITAL_COMMUNITY): Payer: 59 | Admitting: Certified Registered Nurse Anesthetist

## 2023-04-13 DIAGNOSIS — Z90710 Acquired absence of both cervix and uterus: Secondary | ICD-10-CM

## 2023-04-13 DIAGNOSIS — R7303 Prediabetes: Secondary | ICD-10-CM | POA: Diagnosis present

## 2023-04-13 DIAGNOSIS — Z87891 Personal history of nicotine dependence: Secondary | ICD-10-CM

## 2023-04-13 DIAGNOSIS — Z800 Family history of malignant neoplasm of digestive organs: Secondary | ICD-10-CM

## 2023-04-13 DIAGNOSIS — F419 Anxiety disorder, unspecified: Secondary | ICD-10-CM | POA: Diagnosis not present

## 2023-04-13 DIAGNOSIS — E781 Pure hyperglyceridemia: Secondary | ICD-10-CM | POA: Diagnosis present

## 2023-04-13 DIAGNOSIS — G4730 Sleep apnea, unspecified: Secondary | ICD-10-CM | POA: Diagnosis not present

## 2023-04-13 DIAGNOSIS — Z6841 Body Mass Index (BMI) 40.0 and over, adult: Secondary | ICD-10-CM | POA: Diagnosis not present

## 2023-04-13 DIAGNOSIS — Z79899 Other long term (current) drug therapy: Secondary | ICD-10-CM

## 2023-04-13 DIAGNOSIS — Z86010 Personal history of colonic polyps: Secondary | ICD-10-CM

## 2023-04-13 DIAGNOSIS — K219 Gastro-esophageal reflux disease without esophagitis: Secondary | ICD-10-CM | POA: Diagnosis not present

## 2023-04-13 DIAGNOSIS — Z888 Allergy status to other drugs, medicaments and biological substances status: Secondary | ICD-10-CM

## 2023-04-13 DIAGNOSIS — F4310 Post-traumatic stress disorder, unspecified: Secondary | ICD-10-CM | POA: Diagnosis not present

## 2023-04-13 DIAGNOSIS — E559 Vitamin D deficiency, unspecified: Secondary | ICD-10-CM | POA: Diagnosis not present

## 2023-04-13 DIAGNOSIS — Z808 Family history of malignant neoplasm of other organs or systems: Secondary | ICD-10-CM

## 2023-04-13 DIAGNOSIS — I10 Essential (primary) hypertension: Secondary | ICD-10-CM | POA: Diagnosis not present

## 2023-04-13 DIAGNOSIS — Z91040 Latex allergy status: Secondary | ICD-10-CM

## 2023-04-13 DIAGNOSIS — Z9049 Acquired absence of other specified parts of digestive tract: Secondary | ICD-10-CM

## 2023-04-13 DIAGNOSIS — Z8249 Family history of ischemic heart disease and other diseases of the circulatory system: Secondary | ICD-10-CM | POA: Diagnosis not present

## 2023-04-13 DIAGNOSIS — Z880 Allergy status to penicillin: Secondary | ICD-10-CM | POA: Diagnosis not present

## 2023-04-13 HISTORY — PX: GASTRIC ROUX-EN-Y: SHX5262

## 2023-04-13 SURGERY — LAPAROSCOPIC ROUX-EN-Y GASTRIC BYPASS WITH UPPER ENDOSCOPY
Anesthesia: General

## 2023-04-13 MED ORDER — HYDROMORPHONE HCL 1 MG/ML IJ SOLN
0.2500 mg | INTRAMUSCULAR | Status: DC | PRN
  Administered 2023-04-13: 0.5 mg via INTRAVENOUS

## 2023-04-13 MED ORDER — ACETAMINOPHEN 500 MG PO TABS
1000.0000 mg | ORAL_TABLET | Freq: Three times a day (TID) | ORAL | Status: DC
  Administered 2023-04-13 – 2023-04-14 (×4): 1000 mg via ORAL
  Filled 2023-04-13 (×4): qty 2

## 2023-04-13 MED ORDER — OXYCODONE HCL 5 MG PO TABS: 5.0000 mg | ORAL_TABLET | Freq: Once | ORAL | Status: DC | PRN

## 2023-04-13 MED ORDER — DEXAMETHASONE SODIUM PHOSPHATE 10 MG/ML IJ SOLN
INTRAMUSCULAR | Status: AC
  Filled 2023-04-13: qty 1

## 2023-04-13 MED ORDER — HEPARIN SODIUM (PORCINE) 5000 UNIT/ML IJ SOLN
5000.0000 [IU] | Freq: Three times a day (TID) | INTRAMUSCULAR | Status: DC
  Administered 2023-04-13 – 2023-04-14 (×3): 5000 [IU] via SUBCUTANEOUS
  Filled 2023-04-13 (×3): qty 1

## 2023-04-13 MED ORDER — PHENOL 1.4 % MT LIQD
1.0000 | OROMUCOSAL | Status: DC | PRN
  Administered 2023-04-13: 1 via OROMUCOSAL

## 2023-04-13 MED ORDER — PRAZOSIN HCL 5 MG PO CAPS
10.0000 mg | ORAL_CAPSULE | Freq: Every day | ORAL | Status: DC
  Filled 2023-04-13 (×2): qty 2

## 2023-04-13 MED ORDER — HEPARIN SODIUM (PORCINE) 5000 UNIT/ML IJ SOLN
5000.0000 [IU] | INTRAMUSCULAR | Status: AC
  Administered 2023-04-13: 5000 [IU] via SUBCUTANEOUS
  Filled 2023-04-13: qty 1

## 2023-04-13 MED ORDER — SIMETHICONE 80 MG PO CHEW
80.0000 mg | CHEWABLE_TABLET | Freq: Four times a day (QID) | ORAL | Status: DC | PRN
  Administered 2023-04-14: 80 mg via ORAL
  Filled 2023-04-13: qty 1

## 2023-04-13 MED ORDER — DROPERIDOL 2.5 MG/ML IJ SOLN
INTRAMUSCULAR | Status: DC | PRN
  Administered 2023-04-13: 1.25 mg via INTRAVENOUS

## 2023-04-13 MED ORDER — QUETIAPINE FUMARATE 300 MG PO TABS: 300.0000 mg | ORAL_TABLET | Freq: Every day | ORAL | Status: DC

## 2023-04-13 MED ORDER — FLUVOXAMINE MALEATE 50 MG PO TABS: 100.0000 mg | ORAL_TABLET | Freq: Every day | ORAL | Status: DC

## 2023-04-13 MED ORDER — PROPRANOLOL HCL 40 MG PO TABS: 40.0000 mg | ORAL_TABLET | ORAL | Status: DC

## 2023-04-13 MED ORDER — BUPIVACAINE LIPOSOME 1.3 % IJ SUSP: 20.0000 mL | Freq: Once | INTRAMUSCULAR | Status: DC

## 2023-04-13 MED ORDER — 0.9 % SODIUM CHLORIDE (POUR BTL) OPTIME
TOPICAL | Status: DC | PRN
  Administered 2023-04-13: 1000 mL

## 2023-04-13 MED ORDER — ONDANSETRON HCL 4 MG/2ML IJ SOLN
4.0000 mg | INTRAMUSCULAR | Status: DC | PRN
  Administered 2023-04-14: 4 mg via INTRAVENOUS
  Filled 2023-04-13: qty 2

## 2023-04-13 MED ORDER — BUPIVACAINE-EPINEPHRINE 0.25% -1:200000 IJ SOLN
INTRAMUSCULAR | Status: AC
  Filled 2023-04-13: qty 1

## 2023-04-13 MED ORDER — ACETAMINOPHEN 160 MG/5ML PO SOLN: 1000.0000 mg | Freq: Three times a day (TID) | ORAL | Status: DC

## 2023-04-13 MED ORDER — KETAMINE HCL 10 MG/ML IJ SOLN
INTRAMUSCULAR | Status: DC | PRN
  Administered 2023-04-13: 20 mg via INTRAVENOUS
  Administered 2023-04-13 (×3): 10 mg via INTRAVENOUS

## 2023-04-13 MED ORDER — CLONAZEPAM 0.5 MG PO TABS: 0.5000 mg | ORAL_TABLET | ORAL | Status: DC

## 2023-04-13 MED ORDER — QUETIAPINE FUMARATE 200 MG PO TABS: 200.0000 mg | ORAL_TABLET | Freq: Every day | ORAL | Status: DC

## 2023-04-13 MED ORDER — ACETAMINOPHEN 500 MG PO TABS
1000.0000 mg | ORAL_TABLET | ORAL | Status: AC
  Administered 2023-04-13: 1000 mg via ORAL
  Filled 2023-04-13: qty 2

## 2023-04-13 MED ORDER — MIDAZOLAM HCL 5 MG/5ML IJ SOLN
INTRAMUSCULAR | Status: DC | PRN
  Administered 2023-04-13: 2 mg via INTRAVENOUS

## 2023-04-13 MED ORDER — CHLORHEXIDINE GLUCONATE 0.12 % MT SOLN
15.0000 mL | Freq: Once | OROMUCOSAL | Status: AC
  Administered 2023-04-13: 15 mL via OROMUCOSAL

## 2023-04-13 MED ORDER — KETOROLAC TROMETHAMINE 30 MG/ML IJ SOLN: 30.0000 mg | Freq: Once | INTRAMUSCULAR | Status: DC | PRN

## 2023-04-13 MED ORDER — CLONAZEPAM 1 MG PO TABS
1.5000 mg | ORAL_TABLET | Freq: Every day | ORAL | Status: DC
  Administered 2023-04-13: 1.5 mg via ORAL
  Filled 2023-04-13: qty 1

## 2023-04-13 MED ORDER — SCOPOLAMINE 1 MG/3DAYS TD PT72
1.0000 | MEDICATED_PATCH | TRANSDERMAL | Status: DC
  Administered 2023-04-13: 1.5 mg via TRANSDERMAL
  Filled 2023-04-13: qty 1

## 2023-04-13 MED ORDER — PROPOFOL 10 MG/ML IV BOLUS
INTRAVENOUS | Status: DC | PRN
Start: 2023-04-13 — End: 2023-04-13
  Administered 2023-04-13: 50 ug/kg/min via INTRAVENOUS
  Administered 2023-04-13: 200 mg via INTRAVENOUS

## 2023-04-13 MED ORDER — EPHEDRINE 5 MG/ML INJ
INTRAVENOUS | Status: AC
  Filled 2023-04-13: qty 10

## 2023-04-13 MED ORDER — DEXAMETHASONE SODIUM PHOSPHATE 10 MG/ML IJ SOLN
INTRAMUSCULAR | Status: DC | PRN
  Administered 2023-04-13: 10 mg via INTRAVENOUS

## 2023-04-13 MED ORDER — LIDOCAINE HCL (PF) 2 % IJ SOLN
INTRAMUSCULAR | Status: AC
  Filled 2023-04-13: qty 5

## 2023-04-13 MED ORDER — ALBUMIN HUMAN 5 % IV SOLN
INTRAVENOUS | Status: DC | PRN
Start: 2023-04-13 — End: 2023-04-13

## 2023-04-13 MED ORDER — TRAMADOL HCL 50 MG PO TABS: 50.0000 mg | ORAL_TABLET | Freq: Four times a day (QID) | ORAL | Status: DC | PRN

## 2023-04-13 MED ORDER — PROPOFOL 10 MG/ML IV BOLUS
INTRAVENOUS | Status: AC
  Filled 2023-04-13: qty 20

## 2023-04-13 MED ORDER — HYDRALAZINE HCL 20 MG/ML IJ SOLN: 10.0000 mg | INTRAMUSCULAR | Status: DC | PRN

## 2023-04-13 MED ORDER — OXYCODONE HCL 5 MG/5ML PO SOLN: 5.0000 mg | Freq: Once | ORAL | Status: DC | PRN

## 2023-04-13 MED ORDER — GABAPENTIN 300 MG PO CAPS
300.0000 mg | ORAL_CAPSULE | ORAL | Status: AC
  Administered 2023-04-13: 300 mg via ORAL
  Filled 2023-04-13: qty 1

## 2023-04-13 MED ORDER — ROCURONIUM BROMIDE 10 MG/ML (PF) SYRINGE
PREFILLED_SYRINGE | INTRAVENOUS | Status: AC
  Filled 2023-04-13: qty 10

## 2023-04-13 MED ORDER — HYDROMORPHONE HCL 1 MG/ML IJ SOLN: 0.5000 mg | INTRAMUSCULAR | Status: DC | PRN

## 2023-04-13 MED ORDER — ONDANSETRON HCL 4 MG/2ML IJ SOLN
INTRAMUSCULAR | Status: AC
  Filled 2023-04-13: qty 2

## 2023-04-13 MED ORDER — ROCURONIUM BROMIDE 10 MG/ML (PF) SYRINGE
PREFILLED_SYRINGE | INTRAVENOUS | Status: DC | PRN
  Administered 2023-04-13: 30 mg via INTRAVENOUS
  Administered 2023-04-13: 80 mg via INTRAVENOUS

## 2023-04-13 MED ORDER — METOPROLOL TARTRATE 5 MG/5ML IV SOLN: 5.0000 mg | Freq: Four times a day (QID) | INTRAVENOUS | Status: DC | PRN

## 2023-04-13 MED ORDER — CHLORHEXIDINE GLUCONATE 4 % EX SOLN: 60.0000 mL | Freq: Once | CUTANEOUS | Status: DC

## 2023-04-13 MED ORDER — METOCLOPRAMIDE HCL 5 MG/ML IJ SOLN
10.0000 mg | Freq: Four times a day (QID) | INTRAMUSCULAR | Status: DC
  Administered 2023-04-13 – 2023-04-14 (×4): 10 mg via INTRAVENOUS
  Filled 2023-04-13 (×5): qty 2

## 2023-04-13 MED ORDER — ONDANSETRON HCL 4 MG/2ML IJ SOLN
INTRAMUSCULAR | Status: DC | PRN
  Administered 2023-04-13: 4 mg via INTRAVENOUS

## 2023-04-13 MED ORDER — HYDROMORPHONE HCL 1 MG/ML IJ SOLN
INTRAMUSCULAR | Status: AC
  Administered 2023-04-13: 0.5 mg via INTRAVENOUS
  Filled 2023-04-13: qty 2

## 2023-04-13 MED ORDER — FENTANYL CITRATE (PF) 250 MCG/5ML IJ SOLN
INTRAMUSCULAR | Status: AC
  Filled 2023-04-13: qty 5

## 2023-04-13 MED ORDER — LIDOCAINE 2% (20 MG/ML) 5 ML SYRINGE
INTRAMUSCULAR | Status: DC | PRN
  Administered 2023-04-13: 1.5 mg/kg/h via INTRAVENOUS
  Administered 2023-04-13: 100 mg via INTRAVENOUS

## 2023-04-13 MED ORDER — DROPERIDOL 2.5 MG/ML IJ SOLN
INTRAMUSCULAR | Status: AC
  Filled 2023-04-13: qty 2

## 2023-04-13 MED ORDER — GLYCOPYRROLATE 0.2 MG/ML IJ SOLN
INTRAMUSCULAR | Status: AC
  Filled 2023-04-13: qty 1

## 2023-04-13 MED ORDER — GABAPENTIN 300 MG PO CAPS
900.0000 mg | ORAL_CAPSULE | Freq: Every day | ORAL | Status: DC
  Administered 2023-04-13: 900 mg via ORAL
  Filled 2023-04-13: qty 3

## 2023-04-13 MED ORDER — FENTANYL CITRATE (PF) 250 MCG/5ML IJ SOLN
INTRAMUSCULAR | Status: DC | PRN
  Administered 2023-04-13: 100 ug via INTRAVENOUS

## 2023-04-13 MED ORDER — FLUVOXAMINE MALEATE 50 MG PO TABS: 50.0000 mg | ORAL_TABLET | Freq: Every day | ORAL | Status: DC

## 2023-04-13 MED ORDER — PROPOFOL 1000 MG/100ML IV EMUL
INTRAVENOUS | Status: AC
  Filled 2023-04-13: qty 100

## 2023-04-13 MED ORDER — BUPIVACAINE-EPINEPHRINE (PF) 0.25% -1:200000 IJ SOLN
INTRAMUSCULAR | Status: DC | PRN
  Administered 2023-04-13: 50 mL

## 2023-04-13 MED ORDER — PROPRANOLOL HCL 10 MG PO TABS
40.0000 mg | ORAL_TABLET | Freq: Every day | ORAL | Status: DC
  Filled 2023-04-13: qty 4

## 2023-04-13 MED ORDER — METHOCARBAMOL 500 MG IVPB - SIMPLE MED: 500.0000 mg | Freq: Four times a day (QID) | INTRAVENOUS | Status: DC | PRN

## 2023-04-13 MED ORDER — STERILE WATER FOR IRRIGATION IR SOLN
Status: DC | PRN
  Administered 2023-04-13: 1000 mL
  Administered 2023-04-13: 500 mL

## 2023-04-13 MED ORDER — ALBUMIN HUMAN 5 % IV SOLN
INTRAVENOUS | Status: AC
  Filled 2023-04-13: qty 250

## 2023-04-13 MED ORDER — SUGAMMADEX SODIUM 200 MG/2ML IV SOLN
INTRAVENOUS | Status: DC | PRN
  Administered 2023-04-13: 400 mg via INTRAVENOUS

## 2023-04-13 MED ORDER — PHENYLEPHRINE HCL-NACL 20-0.9 MG/250ML-% IV SOLN
INTRAVENOUS | Status: DC | PRN
  Administered 2023-04-13: 35 ug/min via INTRAVENOUS

## 2023-04-13 MED ORDER — QUETIAPINE FUMARATE 100 MG PO TABS
500.0000 mg | ORAL_TABLET | Freq: Every day | ORAL | Status: DC
  Administered 2023-04-13: 500 mg via ORAL
  Filled 2023-04-13 (×3): qty 5

## 2023-04-13 MED ORDER — LIDOCAINE HCL 2 % IJ SOLN
INTRAMUSCULAR | Status: AC
  Filled 2023-04-13: qty 20

## 2023-04-13 MED ORDER — MIDAZOLAM HCL 2 MG/2ML IJ SOLN
INTRAMUSCULAR | Status: AC
  Filled 2023-04-13: qty 2

## 2023-04-13 MED ORDER — ONDANSETRON HCL 4 MG/2ML IJ SOLN: 4.0000 mg | Freq: Once | INTRAMUSCULAR | Status: DC | PRN

## 2023-04-13 MED ORDER — OXYCODONE HCL 5 MG/5ML PO SOLN: 5.0000 mg | Freq: Four times a day (QID) | ORAL | Status: DC | PRN

## 2023-04-13 MED ORDER — FIBRIN SEALANT 2 ML SINGLE DOSE KIT
2.0000 mL | PACK | Freq: Once | CUTANEOUS | Status: AC
  Administered 2023-04-13: 2 mL via TOPICAL
  Filled 2023-04-13: qty 2

## 2023-04-13 MED ORDER — SODIUM CHLORIDE 0.9 % IV SOLN: INTRAVENOUS | Status: DC

## 2023-04-13 MED ORDER — BUPIVACAINE LIPOSOME 1.3 % IJ SUSP
INTRAMUSCULAR | Status: AC
  Filled 2023-04-13: qty 20

## 2023-04-13 MED ORDER — KETAMINE HCL 50 MG/5ML IJ SOSY
PREFILLED_SYRINGE | INTRAMUSCULAR | Status: AC
  Filled 2023-04-13: qty 5

## 2023-04-13 MED ORDER — CLINDAMYCIN PHOSPHATE 900 MG/50ML IV SOLN
900.0000 mg | INTRAVENOUS | Status: AC
  Administered 2023-04-13: 900 mg via INTRAVENOUS
  Filled 2023-04-13: qty 50

## 2023-04-13 MED ORDER — GLYCOPYRROLATE 0.2 MG/ML IJ SOLN
INTRAMUSCULAR | Status: DC | PRN
Start: 2023-04-13 — End: 2023-04-13
  Administered 2023-04-13: .2 mg via INTRAVENOUS

## 2023-04-13 MED ORDER — DOCUSATE SODIUM 100 MG PO CAPS
100.0000 mg | ORAL_CAPSULE | Freq: Two times a day (BID) | ORAL | Status: DC
  Administered 2023-04-13 – 2023-04-14 (×2): 100 mg via ORAL
  Filled 2023-04-13 (×2): qty 1

## 2023-04-13 MED ORDER — APREPITANT 40 MG PO CAPS
40.0000 mg | ORAL_CAPSULE | ORAL | Status: AC
  Administered 2023-04-13: 40 mg via ORAL
  Filled 2023-04-13: qty 1

## 2023-04-13 MED ORDER — LACTATED RINGERS IV SOLN: INTRAVENOUS | Status: DC

## 2023-04-13 MED ORDER — ORAL CARE MOUTH RINSE: 15.0000 mL | Freq: Once | OROMUCOSAL | Status: AC

## 2023-04-13 MED ORDER — CLONAZEPAM 0.5 MG PO TABS: 0.5000 mg | ORAL_TABLET | Freq: Two times a day (BID) | ORAL | Status: DC | PRN

## 2023-04-13 MED ORDER — FLUVOXAMINE MALEATE 50 MG PO TABS
150.0000 mg | ORAL_TABLET | Freq: Every day | ORAL | Status: DC
  Administered 2023-04-13: 150 mg via ORAL
  Filled 2023-04-13 (×2): qty 3

## 2023-04-13 MED ORDER — ENSURE MAX PROTEIN PO LIQD
2.0000 [oz_av] | ORAL | Status: DC
  Administered 2023-04-14 (×5): 2 [oz_av] via ORAL

## 2023-04-13 MED ORDER — PROPRANOLOL HCL 10 MG PO TABS: 40.0000 mg | ORAL_TABLET | Freq: Every day | ORAL | Status: DC | PRN

## 2023-04-13 MED ORDER — "VISTASEAL 4 ML SINGLE DOSE KIT "
4.0000 mL | PACK | Freq: Once | CUTANEOUS | Status: AC
  Administered 2023-04-13: 4 mL via TOPICAL
  Filled 2023-04-13: qty 4

## 2023-04-13 MED ORDER — PANTOPRAZOLE SODIUM 40 MG IV SOLR
40.0000 mg | Freq: Every day | INTRAVENOUS | Status: DC
  Administered 2023-04-13: 40 mg via INTRAVENOUS
  Filled 2023-04-13: qty 10

## 2023-04-13 MED ORDER — LACTATED RINGERS IR SOLN
Status: DC | PRN
  Administered 2023-04-13: 1000 mL

## 2023-04-13 MED ORDER — EPHEDRINE SULFATE-NACL 50-0.9 MG/10ML-% IV SOSY
PREFILLED_SYRINGE | INTRAVENOUS | Status: DC | PRN
Start: 2023-04-13 — End: 2023-04-13
  Administered 2023-04-13 (×4): 10 mg via INTRAVENOUS
  Administered 2023-04-13: 5 mg via INTRAVENOUS

## 2023-04-13 SURGICAL SUPPLY — 87 items
ANTIFOG SOL W/FOAM PAD STRL (MISCELLANEOUS) ×1
APL LAPSCP 35 DL APL RGD (MISCELLANEOUS) ×1
APL PRP STRL LF DISP 70% ISPRP (MISCELLANEOUS) ×2
APL SKNCLS STERI-STRIP NONHPOA (GAUZE/BANDAGES/DRESSINGS) ×1
APL SWBSTK 6 STRL LF DISP (MISCELLANEOUS)
APPLICATOR COTTON TIP 6 STRL (MISCELLANEOUS) IMPLANT
APPLICATOR COTTON TIP 6IN STRL (MISCELLANEOUS)
APPLICATOR VISTASEAL 35 (MISCELLANEOUS) ×1 IMPLANT
APPLIER CLIP ROT 13.4 12 LRG (CLIP)
APR CLP LRG 13.4X12 ROT 20 MLT (CLIP)
BAG COUNTER SPONGE SURGICOUNT (BAG) IMPLANT
BAG SPNG CNTER NS LX DISP (BAG)
BENZOIN TINCTURE PRP APPL 2/3 (GAUZE/BANDAGES/DRESSINGS) ×1 IMPLANT
BLADE SURG SZ11 CARB STEEL (BLADE) ×1 IMPLANT
BNDG ADH 1X3 SHEER STRL LF (GAUZE/BANDAGES/DRESSINGS) ×6 IMPLANT
BNDG ADH THN 3X1 STRL LF (GAUZE/BANDAGES/DRESSINGS) ×6
CABLE HIGH FREQUENCY MONO STRZ (ELECTRODE) IMPLANT
CHLORAPREP W/TINT 26 (MISCELLANEOUS) ×2 IMPLANT
CLIP APPLIE ROT 13.4 12 LRG (CLIP) IMPLANT
CLIP SUT LAPRA TY ABSORB (SUTURE) ×2 IMPLANT
COVER SURGICAL LIGHT HANDLE (MISCELLANEOUS) ×1 IMPLANT
DEVICE SUT QUICK LOAD TK 5 (SUTURE) IMPLANT
DEVICE SUT TI-KNOT TK 5X26 (SUTURE) IMPLANT
DEVICE SUTURE ENDOST 10MM (ENDOMECHANICALS) ×1 IMPLANT
DRAIN PENROSE 0.25X18 (DRAIN) ×1 IMPLANT
DRAIN PENROSE LF 8X20.3CM SIL (WOUND CARE) IMPLANT
ELECT REM PT RETURN 15FT ADLT (MISCELLANEOUS) ×1 IMPLANT
GAUZE 4X4 16PLY ~~LOC~~+RFID DBL (SPONGE) ×1 IMPLANT
GAUZE SPONGE 4X4 12PLY STRL (GAUZE/BANDAGES/DRESSINGS) IMPLANT
GLOVE BIO SURGEON STRL SZ 6 (GLOVE) ×1 IMPLANT
GLOVE INDICATOR 6.5 STRL GRN (GLOVE) ×1 IMPLANT
GOWN STRL REUS W/ TWL LRG LVL3 (GOWN DISPOSABLE) ×1 IMPLANT
GOWN STRL REUS W/ TWL XL LVL3 (GOWN DISPOSABLE) IMPLANT
GOWN STRL REUS W/TWL LRG LVL3 (GOWN DISPOSABLE) ×1
GOWN STRL REUS W/TWL XL LVL3 (GOWN DISPOSABLE)
IRRIG SUCT STRYKERFLOW 2 WTIP (MISCELLANEOUS) ×1
IRRIGATION SUCT STRKRFLW 2 WTP (MISCELLANEOUS) ×1 IMPLANT
KIT BASIN OR (CUSTOM PROCEDURE TRAY) ×1 IMPLANT
KIT GASTRIC LAVAGE 34FR ADT (SET/KITS/TRAYS/PACK) ×1 IMPLANT
KIT TURNOVER KIT A (KITS) IMPLANT
MARKER SKIN DUAL TIP RULER LAB (MISCELLANEOUS) ×1 IMPLANT
MAT PREVALON FULL STRYKER (MISCELLANEOUS) ×1 IMPLANT
NDL SPNL 22GX3.5 QUINCKE BK (NEEDLE) ×1 IMPLANT
NEEDLE SPNL 22GX3.5 QUINCKE BK (NEEDLE) ×1 IMPLANT
PACK CARDIOVASCULAR III (CUSTOM PROCEDURE TRAY) ×1 IMPLANT
PENCIL SMOKE EVACUATOR (MISCELLANEOUS) IMPLANT
RELOAD ENDO STITCH 2.0 (ENDOMECHANICALS) ×9
RELOAD STAPLE 60 2.6 WHT THN (STAPLE) ×2 IMPLANT
RELOAD STAPLE 60 3.6 BLU REG (STAPLE) ×2 IMPLANT
RELOAD STAPLE 60 3.8 GOLD REG (STAPLE) IMPLANT
RELOAD STAPLE 60 4.1 GRN THCK (STAPLE) IMPLANT
RELOAD STAPLER BLUE 60MM (STAPLE) ×5 IMPLANT
RELOAD STAPLER GOLD 60MM (STAPLE) ×1 IMPLANT
RELOAD STAPLER GREEN 60MM (STAPLE) IMPLANT
RELOAD STAPLER WHITE 60MM (STAPLE) ×2 IMPLANT
RELOAD SUT SNGL STCH ABSRB 2-0 (ENDOMECHANICALS) ×4 IMPLANT
RELOAD SUT SNGL STCH BLK 2-0 (ENDOMECHANICALS) ×4 IMPLANT
SCISSORS LAP 5X45 EPIX DISP (ENDOMECHANICALS) ×1 IMPLANT
SET TUBE SMOKE EVAC HIGH FLOW (TUBING) ×1 IMPLANT
SHEARS HARMONIC ACE PLUS 45CM (MISCELLANEOUS) ×1 IMPLANT
SLEEVE ADV FIXATION 12X100MM (TROCAR) IMPLANT
SLEEVE ADV FIXATION 5X100MM (TROCAR) ×2 IMPLANT
SOLUTION ANTFG W/FOAM PAD STRL (MISCELLANEOUS) ×1 IMPLANT
STAPLER ECHELON BIOABSB 60 FLE (MISCELLANEOUS) IMPLANT
STAPLER ECHELON LONG 60 440 (INSTRUMENTS) ×1 IMPLANT
STAPLER RELOAD BLUE 60MM (STAPLE) ×5
STAPLER RELOAD GOLD 60MM (STAPLE) ×1
STAPLER RELOAD GREEN 60MM (STAPLE)
STAPLER RELOAD WHITE 60MM (STAPLE) ×2
STRIP CLOSURE SKIN 1/2X4 (GAUZE/BANDAGES/DRESSINGS) ×1 IMPLANT
SUT MNCRL AB 4-0 PS2 18 (SUTURE) ×1 IMPLANT
SUT RELOAD ENDO STITCH 2 48X1 (ENDOMECHANICALS) ×4
SUT RELOAD ENDO STITCH 2.0 (ENDOMECHANICALS) ×5
SUT SURGIDAC NAB ES-9 0 48 120 (SUTURE) IMPLANT
SUT VIC AB 2-0 SH 27 (SUTURE) ×1
SUT VIC AB 2-0 SH 27X BRD (SUTURE) ×1 IMPLANT
SUTURE RELOAD END STTCH 2 48X1 (ENDOMECHANICALS) ×4 IMPLANT
SUTURE RELOAD ENDO STITCH 2.0 (ENDOMECHANICALS) ×5 IMPLANT
SYR 20ML ECCENTRIC (SYRINGE) ×1 IMPLANT
SYR 20ML LL LF (SYRINGE) ×1 IMPLANT
TOWEL OR 17X26 10 PK STRL BLUE (TOWEL DISPOSABLE) ×1 IMPLANT
TOWEL OR NON WOVEN STRL DISP B (DISPOSABLE) ×1 IMPLANT
TRAY FOLEY MTR SLVR 16FR STAT (SET/KITS/TRAYS/PACK) ×1 IMPLANT
TROCAR ADV FIXATION 12X100MM (TROCAR) ×1 IMPLANT
TROCAR ADV FIXATION 5X100MM (TROCAR) ×1 IMPLANT
TROCAR XCEL NON-BLD 5MMX100MML (ENDOMECHANICALS) ×1 IMPLANT
TUBING CONNECTING 10 (TUBING) IMPLANT

## 2023-04-13 NOTE — Plan of Care (Signed)
  Problem: Education: Goal: Knowledge of the prescribed self-care regimen will improve Outcome: Progressing   Problem: Education: Goal: Knowledge of General Education information will improve Description: Including pain rating scale, medication(s)/side effects and non-pharmacologic comfort measures Outcome: Progressing

## 2023-04-13 NOTE — Progress Notes (Signed)

## 2023-04-13 NOTE — Progress Notes (Signed)
PHARMACY CONSULT FOR:  Risk Assessment for Post-Discharge VTE Following Bariatric Surgery  Post-Discharge VTE Risk Assessment: This patient's probability of 30-day post-discharge VTE is increased due to the factors marked:  Sleeve gastrectomy   Liver disorder (transplant, cirrhosis, or nonalcoholic steatohepatitis)   Hx of VTE   Hemorrhage requiring transfusion   GI perforation, leak, or obstruction   ====================================================    Female    Age >/=60 years    BMI >/=50 kg/m2    CHF    Dyspnea at Rest    Paraplegia   X Non-gastric-band surgery    Operation Time >/=3 hr    Return to OR     Length of Stay >/= 3 d   Hypercoagulable condition   Significant venous stasis    Predicted probability of 30-day post-discharge VTE: 0.16%  Other patient-specific factors to consider: n/a   Recommendation for Discharge: No pharmacologic prophylaxis post-discharge Follow daily and recalculate estimated 30d VTE risk if returns to OR or LOS reaches 3 days.   Caroline Gonzales is a 47 y.o. female who underwent laparoscopic Roux-en-Y gastric bypass on 04/13/23   Case start: 0805 Case end: 0956   Allergies  Allergen Reactions   Bacitracin Rash   Penicillins Shortness Of Breath and Rash   Tamiflu [Oseltamivir Phosphate] Anaphylaxis   Cephalosporins Rash   Latex Rash    Patient Measurements: Height: 5\' 5"  (165.1 cm) Weight: 118.4 kg (261 lb) IBW/kg (Calculated) : 57 Body mass index is 43.43 kg/m.  No results for input(s): "WBC", "HGB", "HCT", "PLT", "APTT", "CREATININE", "LABCREA", "CREAT24HRUR", "MG", "PHOS", "ALBUMIN", "PROT", "AST", "ALT", "ALKPHOS", "BILITOT", "BILIDIR", "IBILI" in the last 72 hours. Estimated Creatinine Clearance: 91.4 mL/min (by C-G formula based on SCr of 0.98 mg/dL).  Past Medical History:  Diagnosis Date   Acute costochondritis 11/14/2021   Allergy    Zyrtec.   Anxiety    followed by Dr. Toni Arthurs   Back pain    Complication of  anesthesia    PONV   Depression    Fibroid    Gastroparesis 09/14/2012   gastric emptying study in 2014   GERD (gastroesophageal reflux disease)    Headache    Hypertriglyceridemia 10/07/2017   Nightmares 07/21/2018   Pneumonia    2013ish   PONV (postoperative nausea and vomiting)     likes scopolamine patch and zofran /phenergan helps   Pre-diabetes    PTSD (post-traumatic stress disorder)    Residual foreign body in soft tissue 11/12/2016   Screening for colon cancer 07/02/2022   Sleep apnea 07/01/2018   cpap optional, pt close to not use cpap   Vitamin D deficiency    Wears glasses     Medications Prior to Admission  Medication Sig Dispense Refill Last Dose   Biotin 5 MG TABS Take by mouth.   04/12/2023   cetirizine (ZYRTEC) 10 MG tablet Take 10 mg by mouth at bedtime.   04/12/2023   Cholecalciferol (VITAMIN D3) 5000 units CAPS Take 5,000 Units by mouth at bedtime.   04/12/2023   clonazePAM (KLONOPIN) 0.5 MG tablet Take 1 tablet (0.5 mg total) by mouth every 6 (six) hours as needed for anxiety. 3 daily at bedtime (Patient taking differently: Take 0.5-1.5 mg by mouth See admin instructions. Take 1.5 mg (3 tablets) by mouth at bedtime along with 0.5 mg (1 tablet) twice daily as needed.) 120 tablet 5 04/13/2023 at 0415   esomeprazole (NEXIUM) 20 MG capsule Take 20 mg by mouth at bedtime.   04/12/2023  fluvoxaMINE (LUVOX) 100 MG tablet Take 1 tablet by mouth at bedtime. Take with 50 mg dose to equal 150 mg at bedtime 90 tablet 3 04/12/2023   fluvoxaMINE (LUVOX) 50 MG tablet TAKE 1 TABLET BY MOUTH AT BEDTIME. TAKE IN ADDITION TO THE 100 MG TABLET TO EQUAL 150 MG TOTAL 90 tablet 3 04/12/2023   gabapentin (NEURONTIN) 300 MG capsule Take 3 capsules (900 mg total) by mouth at bedtime. 270 capsule 3 04/12/2023   Multiple Vitamin (MULTIVITAMIN) capsule Take 1 capsule by mouth daily.   04/12/2023   ondansetron (ZOFRAN-ODT) 8 MG disintegrating tablet Dissolve 1 tablet by mouth every 8 hours as  needed for nausea. 30 tablet 11 04/13/2023 at 0415   prazosin (MINIPRESS) 5 MG capsule Take 2 capsules (10 mg total) by mouth at bedtime. 180 capsule 3 04/12/2023   propranolol (INDERAL) 40 MG tablet Take 1 tablet by mouth twice a day --May take an extra tablet in evening if needed (Patient taking differently: Take 40 mg by mouth See admin instructions. Take 40 mg (1 tablet) by nightly in addition to 40 mg (1 tablet) as needed during the day.) 270 tablet 3 04/13/2023 at 0515   QUEtiapine (SEROQUEL) 200 MG tablet Take 0.5-1 tablets (100-200 mg total) by mouth at bedtime. (Patient taking differently: Take 200 mg by mouth at bedtime.) 90 tablet 3 04/12/2023   QUEtiapine (SEROQUEL) 300 MG tablet Take 1 tablet by mouth at bedtime. 90 tablet 3 04/12/2023   triamcinolone (NASACORT) 55 MCG/ACT AERO nasal inhaler Place 1 spray into the nose at bedtime.   04/12/2023   UNABLE TO FIND Take 1 tablet by mouth at bedtime. Magtein   04/12/2023     Bernadene Person, PharmD, BCPS [phone] 04/13/2023, 1:51 PM

## 2023-04-13 NOTE — Transfer of Care (Signed)
Immediate Anesthesia Transfer of Care Note  Patient: Caroline Gonzales  Procedure(s) Performed: LAPAROSCOPIC ROUX-EN-Y GASTRIC BYPASS WITH UPPER ENDOSCOPY  Patient Location: PACU  Anesthesia Type:General  Level of Consciousness: drowsy  Airway & Oxygen Therapy: Patient Spontanous Breathing and Patient connected to face mask oxygen  Post-op Assessment: Report given to RN and Post -op Vital signs reviewed and stable  Post vital signs: Reviewed and stable  Last Vitals:  Vitals Value Taken Time  BP 100/51 04/13/23 1012  Temp    Pulse 65 04/13/23 1015  Resp 23 04/13/23 1015  SpO2 95 % 04/13/23 1015  Vitals shown include unfiled device data.  Last Pain:  Vitals:   04/13/23 0606  TempSrc: Oral  PainSc:       Patients Stated Pain Goal: 3 (04/13/23 0557)  Complications: No notable events documented.

## 2023-04-13 NOTE — Anesthesia Preprocedure Evaluation (Signed)
Anesthesia Evaluation  Patient identified by MRN, date of birth, ID band Patient awake    Reviewed: Allergy & Precautions, H&P , NPO status , Patient's Chart, lab work & pertinent test results  History of Anesthesia Complications (+) PONV and history of anesthetic complications  Airway Mallampati: II  TM Distance: <3 FB Neck ROM: Full    Dental no notable dental hx.    Pulmonary sleep apnea , former smoker   Pulmonary exam normal breath sounds clear to auscultation       Cardiovascular negative cardio ROS Normal cardiovascular exam Rhythm:Regular Rate:Normal     Neuro/Psych negative neurological ROS  negative psych ROS   GI/Hepatic Neg liver ROS,GERD  ,,  Endo/Other    Morbid obesity  Renal/GU negative Renal ROS  negative genitourinary   Musculoskeletal negative musculoskeletal ROS (+)    Abdominal   Peds negative pediatric ROS (+)  Hematology negative hematology ROS (+)   Anesthesia Other Findings   Reproductive/Obstetrics negative OB ROS                             Anesthesia Physical Anesthesia Plan  ASA: 3  Anesthesia Plan: General   Post-op Pain Management: Tylenol PO (pre-op)*   Induction: Intravenous  PONV Risk Score and Plan: 4 or greater and Ondansetron, Dexamethasone, Midazolam, Scopolamine patch - Pre-op and Treatment may vary due to age or medical condition  Airway Management Planned: Oral ETT  Additional Equipment:   Intra-op Plan:   Post-operative Plan: Extubation in OR  Informed Consent: I have reviewed the patients History and Physical, chart, labs and discussed the procedure including the risks, benefits and alternatives for the proposed anesthesia with the patient or authorized representative who has indicated his/her understanding and acceptance.     Dental advisory given  Plan Discussed with: CRNA and Surgeon  Anesthesia Plan Comments:         Anesthesia Quick Evaluation

## 2023-04-13 NOTE — Anesthesia Procedure Notes (Signed)
Procedure Name: Intubation Date/Time: 04/13/2023 7:45 AM  Performed by: Orest Dikes, CRNAPre-anesthesia Checklist: Patient identified, Emergency Drugs available, Suction available and Patient being monitored Patient Re-evaluated:Patient Re-evaluated prior to induction Oxygen Delivery Method: Circle system utilized Preoxygenation: Pre-oxygenation with 100% oxygen Induction Type: IV induction Ventilation: Mask ventilation without difficulty Laryngoscope Size: Mac and 4 Grade View: Grade I Tube type: Oral Tube size: 7.0 mm Number of attempts: 1 Airway Equipment and Method: Stylet Placement Confirmation: ETT inserted through vocal cords under direct vision, positive ETCO2 and breath sounds checked- equal and bilateral Secured at: 21 cm Tube secured with: Tape Dental Injury: Teeth and Oropharynx as per pre-operative assessment

## 2023-04-13 NOTE — Anesthesia Postprocedure Evaluation (Signed)
Anesthesia Post Note  Patient: Caroline Gonzales  Procedure(s) Performed: LAPAROSCOPIC ROUX-EN-Y GASTRIC BYPASS WITH UPPER ENDOSCOPY     Patient location during evaluation: PACU Anesthesia Type: General Level of consciousness: awake and alert Pain management: pain level controlled Vital Signs Assessment: post-procedure vital signs reviewed and stable Respiratory status: spontaneous breathing, nonlabored ventilation, respiratory function stable and patient connected to nasal cannula oxygen Cardiovascular status: blood pressure returned to baseline and stable Postop Assessment: no apparent nausea or vomiting Anesthetic complications: no  No notable events documented.  Last Vitals:  Vitals:   04/13/23 1145 04/13/23 1200  BP: 99/75 102/61  Pulse: 65 68  Resp: 16 14  Temp:    SpO2: 93% 96%    Last Pain:  Vitals:   04/13/23 1200  TempSrc:   PainSc: 3                  Kathryne Ramella S

## 2023-04-13 NOTE — Op Note (Signed)
Operative Note  Caroline Gonzales  [id number]  [id number]  04/13/2023   Surgeon: Phylliss Blakes MD   Assistant: Feliciana Rossetti MD   Procedure performed: laparoscopic Roux-en-Y gastric bypass (antecolic, antegastric), upper endoscopy   Preop diagnosis: Morbid obesity Body mass index is 43.43 kg/m. Post-op diagnosis/intraop findings: same   Specimens: none Retained items: none  EBL: minimal cc Complications: none   Description of procedure: After confiming informed consent and administration of prophylactic heparin in holding, the patient was taken to the operating room and placed supine on operating room table where general endotracheal anesthesia was initiated, preoperative antibiotics were administered, SCDs applied, and a formal timeout was performed. The abdomen was prepped and draped in usual sterile fashion. Peritoneal access was gained using a Visiport technique in the left upper quadrant and insufflation to 15 mmHg ensued without issue. Gross inspection revealed no evidence of injury. Under direct visualization the remaining trochars were inserted. A laparoscopic assisted bilateral taps block was performed using Exparel mixed with Marcaine.  The omentum was reflected cephalad and the ligament of Treitz identified. The small bowel was followed to a point 50 cm distal to ligament of Treitz at which location the bowel was divided with a white load linear cutting stapler. A Penrose was sutured to the Roux side of the staple line for future identification. The bowel was measured another 100 cm distal to this and and the site for the jejunojejunostomy was aligned with the end of the biliopancreatic limb. Enterotomies were made with the Harmonic scalpel and the anastomosis was created with the 60 mm white load linear cutting stapler. The common enterotomy was closed with running 3-0 Vicryl starting on either end and tying centrally. The mesenteric defect was closed with running silk suture  secured with Lapra-Ty's. The anastomosis was inspected and appeared widely patent, hemostatic with no gaps in the suture line. Vistaseal was injected over the anastomosis. We then divided the omentum using the harmonic scalpel.  The patient was then placed in steep reverse Trendelenburg. The liver retractor was inserted through a subxiphoid incision and secured for fixed retraction of the left lobe. The Harmonic scalpel was used to enter the perigastric plane and the lesser sac at a point 5 cm distal to the GE junction on the lesser curve. The angle of His was gently bluntly dissected and the target shape of the pouch visualized to exclude any residual fundus. After confirming that all tubes have been removed from the stomach, the gastric pouch was created with serial fires of the linear cutting stapler. As we approached the angle of His, the Ewald tube was inserted to confirm no impingement on the GE junction.  The Roux limb with its attached Penrose drain was then identified and brought up to meet the gastric pouch ensuring no twist in the small bowel mesentery. The staple line of the small bowel is directed to the patient's left side. A running 3-0 Vicryl was used to create a posterior suture line for our anastomosis between the gastric pouch and the small bowel. Gastrotomy and enterotomy was made with the Harmonic scalpel and a blue load linear cutting stapler was used to create a gastrojejunal anastomosis approximately 2.5cm wide. The common enterotomy was closed with running 3-0 Vicryl starting at either end and tying centrally. At this juncture the Ewald tube was passed through the gastrojejunal anastomosis. An anterior second layer of running 3-0 Vicryl was used to complete the gastrojejunal anastomosis. The ewald tube was removed without difficulty. The Delphi  space was closed with a figure-of-eight silk suture.  At this point the assistant performed an upper endoscopy with the Roux limb gently clamped  with a bowel clamp. Irrigation is instilled in the upper abdomen for a leak test. Please see his separate operative note- the anastomosis is noted to be patent and hemostatic without any leak or bubbles present. Additional vistaseal was sprayed over the gastrojejunal anastomosis. The endoscope was removed and the abdomen once again surveyed. The liver retractor was removed under direct visualization. The abdomen was then desufflated and all remaining trochars removed. The skin incisions were closed with subcuticular 4-0 Monocryl; benzoin, Steri-Strips and Band-Aids were applied The patient was then awakened, extubated and taken to PACU in stable condition.     All counts were correct at the completion of the case.

## 2023-04-13 NOTE — Interval H&P Note (Signed)
History and Physical Interval Note:  04/13/2023 7:08 AM  Caroline Gonzales  has presented today for surgery, with the diagnosis of MORBID OBESITY.  The various methods of treatment have been discussed with the patient and family. After consideration of risks, benefits and other options for treatment, the patient has consented to  Procedure(s): LAPAROSCOPIC ROUX-EN-Y GASTRIC BYPASS WITH UPPER ENDOSCOPY (N/A) as a surgical intervention.  The patient's history has been reviewed, patient examined, no change in status, stable for surgery.  I have reviewed the patient's chart and labs.  Questions were answered to the patient's satisfaction.     Malaak Stach Lollie Sails

## 2023-04-13 NOTE — Op Note (Signed)
Preoperative diagnosis: Roux-en-Y gastric bypass  Postoperative diagnosis: Same   Procedure: Upper endoscopy   Surgeon: Feliciana Rossetti, M.D.  Anesthesia: Gen.   Indications for procedure: This patient was undergoing a Roux-en-Y gastric bypass.   Description of procedure: The endoscopy was placed in the mouth and into the oropharynx and under endoscopic vision it was advanced to the esophagogastric junction.  The stomach was insufflated and no bleeding or bubbles were seen.  The GEJ was identified at 37 cm from the teeth. The anastomosis was seen at 44 cm. No bleeding or leaks were detected. The scope was withdrawn without difficulty.    Feliciana Rossetti, M.D. General, Bariatric, & Minimally Invasive Surgery [facility]

## 2023-04-14 ENCOUNTER — Encounter (HOSPITAL_COMMUNITY): Payer: Self-pay | Admitting: Surgery

## 2023-04-14 ENCOUNTER — Other Ambulatory Visit (HOSPITAL_COMMUNITY): Payer: Self-pay

## 2023-04-14 MED ORDER — PANTOPRAZOLE SODIUM 40 MG PO TBEC
40.0000 mg | DELAYED_RELEASE_TABLET | Freq: Every day | ORAL | 0 refills | Status: DC
  Filled 2023-04-14: qty 90, 90d supply, fill #0

## 2023-04-14 MED ORDER — POTASSIUM CHLORIDE 10 MEQ/100ML IV SOLN
10.0000 meq | INTRAVENOUS | Status: AC
  Administered 2023-04-14 (×4): 10 meq via INTRAVENOUS
  Filled 2023-04-14: qty 100

## 2023-04-14 MED ORDER — TRAMADOL HCL 50 MG PO TABS
50.0000 mg | ORAL_TABLET | Freq: Four times a day (QID) | ORAL | 0 refills | Status: DC | PRN
  Filled 2023-04-14: qty 10, 3d supply, fill #0

## 2023-04-14 MED ORDER — ACETAMINOPHEN 500 MG PO TABS: 1000.0000 mg | ORAL_TABLET | Freq: Three times a day (TID) | ORAL | Status: AC

## 2023-04-14 NOTE — Discharge Instructions (Signed)

## 2023-04-14 NOTE — Progress Notes (Signed)
   04/14/23 0839  TOC Brief Assessment  Insurance and Status Reviewed  Patient has primary care physician Yes  Home environment has been reviewed Resides with spouse  Prior level of function: Independent at baseline  Social Determinants of Health Reivew SDOH reviewed no interventions necessary  Readmission risk has been reviewed Yes  Transition of care needs no transition of care needs at this time

## 2023-04-14 NOTE — Plan of Care (Signed)
  Problem: Education: Goal: Ability to state signs and symptoms to report to health care provider will improve Outcome: Progressing Goal: Knowledge of the prescribed self-care regimen will improve Outcome: Progressing

## 2023-04-14 NOTE — Progress Notes (Signed)
Discharge instructions discussed with patient, verbalized agreement and understanding 

## 2023-04-14 NOTE — Discharge Summary (Signed)
Physician Discharge Summary  Caroline Gonzales NFA:[id number] DOB: 13-Aug-1976 DOA: 04/13/2023  PCP: Tollie Eth, NP  Admit date: 04/13/2023 Discharge date: 04/14/2023   Recommendations for Outpatient Follow-up:     Follow-up Information     Berna Bue, MD. Go on 05/05/2023.   Specialty: General Surgery Why: Please arrive 15 minutes prior to your appointment at 9:20am Contact information: [address] [place] [address] [phone]         Berna Bue, MD Follow up on 06/03/2023.   Specialty: General Surgery Why: Please arrive 15 minutes prior to your appointment at 9:20a Contact information: [address] [place] [address] [phone]                Discharge Diagnoses:  Principal Problem:   Morbid obesity [facility])   Surgical Procedure: Laparoscopic Roux-en-Y gastric bypass, upper endoscopy  Discharge Condition: Good Disposition: Home  Diet recommendation: Postoperative gastric bypass diet  Filed Weights   04/13/23 0557  Weight: 118.4 kg     Hospital Course:  The patient was admitted for a planned laparoscopic Roux-en-Y gastric bypass. Please see operative note. Preoperatively the patient was given 5000 units of subcutaneous heparin for DVT prophylaxis. ERAS protocol was used. Postoperative prophylactic heparin dosing was started on the evening of postoperative day 0.  The patient was started on ice chips and water on the evening of POD 0 which they tolerated. On postoperative day 1. The patient's diet was advanced to protein shakes which they also tolerated. The patient was ambulating without difficulty. Their vital signs are stable without fever or tachycardia. Their hemoglobin had remained stable. The patient had received discharge instructions and counseling. They were deemed stable for discharge.  BP 111/73 (BP Location: Left Arm)   Pulse (!) 58   Temp 98.1 F (36.7 C) (Oral)   Resp 17   Ht 5\' 5"   (1.651 m)   Wt 118.4 kg   LMP 01/06/2021   SpO2 97%   BMI 43.43 kg/m   Gen: alert, NAD, non-toxic appearing Pulm: unlabored resp CV: regular rate and rhythm Abd: soft, nontender, nondistended. No cellulitis. No incisional hernia Ext: no edema, no calf tenderness Skin: no rash, no jaundice  Discharge Instructions   Allergies as of 04/14/2023       Reactions   Bacitracin Rash   Penicillins Shortness Of Breath, Rash   Tamiflu [oseltamivir Phosphate] Anaphylaxis   Cephalosporins Rash   Latex Rash        Medication List     STOP taking these medications    esomeprazole 20 MG capsule Commonly known as: NEXIUM       TAKE these medications    acetaminophen 500 MG tablet Commonly known as: TYLENOL Take 2 tablets (1,000 mg total) by mouth every 8 (eight) hours for 5 days.   Biotin 5 MG Tabs Take by mouth.   cetirizine 10 MG tablet Commonly known as: ZYRTEC Take 10 mg by mouth at bedtime.   clonazePAM 0.5 MG tablet Commonly known as: KLONOPIN Take 1 tablet (0.5 mg total) by mouth every 6 (six) hours as needed for anxiety. 3 daily at bedtime What changed:  how much to take when to take this additional instructions Notes to patient: Schedule and attend appointment no later than one month post-op with your physician for appropriate management of any psych medications following surgery.   fluvoxaMINE 50 MG tablet Commonly known as: LUVOX TAKE 1 TABLET BY MOUTH AT BEDTIME.  TAKE IN ADDITION TO THE 100 MG TABLET TO EQUAL 150 MG TOTAL Notes to patient: Schedule and attend appointment no later than one month post-op with your physician for appropriate management of any psych medications following surgery.   fluvoxaMINE 100 MG tablet Commonly known as: LUVOX Take 1 tablet by mouth at bedtime. Take with 50 mg dose to equal 150 mg at bedtime Notes to patient: Schedule and attend appointment no later than one month post-op with your physician for appropriate management of  any psych medications following surgery.   gabapentin 300 MG capsule Commonly known as: NEURONTIN Take 3 capsules (900 mg total) by mouth at bedtime.   multivitamin capsule Take 1 capsule by mouth daily.   ondansetron 8 MG disintegrating tablet Commonly known as: ZOFRAN-ODT Dissolve 1 tablet by mouth every 8 hours as needed for nausea.   pantoprazole 40 MG tablet Commonly known as: PROTONIX Take 1 tablet (40 mg total) by mouth daily. Notes to patient: Take this medication daily regardless of reflux symptoms   prazosin 5 MG capsule Commonly known as: MINIPRESS Take 2 capsules (10 mg total) by mouth at bedtime. Notes to patient: Monitor Blood Pressure Daily and keep a log for primary care physician.  Your physician may need to make changes to your medications with rapid weight loss.      propranolol 40 MG tablet Commonly known as: INDERAL Take 1 tablet by mouth twice a day --May take an extra tablet in evening if needed What changed:  how much to take how to take this when to take this additional instructions Notes to patient: Monitor Blood Pressure Daily and keep a log for primary care physician.  Your physician may need to make changes to your medications with rapid weight loss.      QUEtiapine 300 MG tablet Commonly known as: SEROQUEL Take 1 tablet by mouth at bedtime. What changed: Another medication with the same name was changed. Make sure you understand how and when to take each. Notes to patient: Schedule and attend appointment no later than one month post-op with your physician for appropriate management of any psych medications following surgery.   QUEtiapine 200 MG tablet Commonly known as: SEROquel Take 0.5-1 tablets (100-200 mg total) by mouth at bedtime. What changed: how much to take Notes to patient: Schedule and attend appointment no later than one month post-op with your physician for appropriate management of any psych medications following surgery.    traMADol 50 MG tablet Commonly known as: ULTRAM Take 1 tablet (50 mg total) by mouth every 6 (six) hours as needed (pain).   triamcinolone 55 MCG/ACT Aero nasal inhaler Commonly known as: NASACORT Place 1 spray into the nose at bedtime.   UNABLE TO FIND Take 1 tablet by mouth at bedtime. Magtein   Vitamin D3 125 MCG (5000 UT) Caps Take 5,000 Units by mouth at bedtime.        Follow-up Information     Berna Bue, MD. Go on 05/05/2023.   Specialty: General Surgery Why: Please arrive 15 minutes prior to your appointment at 9:20am Contact information: [address] [place] [address] [phone]         Berna Bue, MD Follow up on 06/03/2023.   Specialty: General Surgery Why: Please arrive 15 minutes prior to your appointment at 9:20a Contact information: [address] [place] [address] [phone]  The results of significant diagnostics from this hospitalization (including imaging, microbiology, ancillary and laboratory) are listed below for reference.    Significant Diagnostic Studies: No results found.  Labs: Basic Metabolic Panel: Recent Labs  Lab 04/14/23 0503  NA 139  K 3.6  CL 103  CO2 27  GLUCOSE 107*  BUN 17  CREATININE 1.01*  CALCIUM 8.9  MG 2.2   Liver Function Tests: Recent Labs  Lab 04/14/23 0503  AST 36  ALT 38  ALKPHOS 79  BILITOT 0.4  PROT 7.8  ALBUMIN 3.8    CBC: Recent Labs  Lab 04/14/23 0503  WBC 12.9*  HGB 11.5*  HCT 36.3  MCV 89.6  PLT 307    CBG: No results for input(s): "GLUCAP" in the last 168 hours.  Principal Problem:   Morbid obesity (HCC)     Signed:  Berna Bue MD [facility] [phone] 04/14/2023, 12:23 PM

## 2023-04-14 NOTE — Progress Notes (Signed)
Patient alert and oriented, pain is controlled. Patient is tolerating fluids, advanced to protein shake today, patient is tolerating well. Reviewed Gastric sleeve/bypass discharge instructions with patient and patient is able to articulate understanding. Provided information on BELT program, Support Group, BSTOP-D, and [facility] outpatient pharmacy. Communicated general update of patient status to surgeon. All questions answered. 24hr fluid recall is 750 mL per hydration protocol, bariatric nurse coordinator to make follow-up phone call within one week.

## 2023-04-15 ENCOUNTER — Telehealth: Payer: Self-pay

## 2023-04-15 NOTE — Transitions of Care (Post Inpatient/ED Visit) (Signed)
   04/15/2023  Name: Caroline Gonzales MRN: [id number] DOB: 05-03-1976  Today's TOC FU Call Status: Today's TOC FU Call Status:: Unsuccessful Call (1st Attempt) Unsuccessful Call (1st Attempt) Date: 04/15/23  Attempted to reach the patient regarding the most recent Inpatient/ED visit.  Follow Up Plan: Additional outreach attempts will be made to reach the patient to complete the Transitions of Care (Post Inpatient/ED visit) call.   Signature   Woodfin Ganja LPN [facility] Nurse Health Advisor Direct Dial [phone]

## 2023-04-15 NOTE — Transitions of Care (Post Inpatient/ED Visit) (Signed)
   04/15/2023  Name: Caroline Gonzales MRN: [id number] DOB: [birth year]  Today's TOC FU Call Status: Today's TOC FU Call Status:: Successful TOC FU Call Completed Unsuccessful Call (1st Attempt) Date: 04/15/23 Unsuccessful Call (2nd Attempt) Date: 04/15/23 [facility] FU Call Complete Date: 04/15/23  Attempted to reach the patient regarding the most recent Inpatient/ED visit.  Follow Up Plan: No further outreach attempts will be made at this time. We have been unable to contact the patient.  Signature   Woodfin Ganja LPN [facility] Nurse Health Advisor Direct Dial [phone]

## 2023-04-17 ENCOUNTER — Other Ambulatory Visit (HOSPITAL_COMMUNITY): Payer: Self-pay

## 2023-04-19 ENCOUNTER — Other Ambulatory Visit (HOSPITAL_COMMUNITY): Payer: Self-pay

## 2023-04-20 ENCOUNTER — Other Ambulatory Visit (HOSPITAL_COMMUNITY): Payer: Self-pay

## 2023-04-20 ENCOUNTER — Other Ambulatory Visit: Payer: Self-pay

## 2023-04-21 ENCOUNTER — Telehealth (HOSPITAL_COMMUNITY): Payer: Self-pay | Admitting: *Deleted

## 2023-04-21 NOTE — Telephone Encounter (Signed)
1. Tell me about your pain and pain management?     Pt denies any pain.     2. Let's talk about fluid intake. How much total fluid are you taking in?   Pt states that s/he is getting in at least 60oz of fluid including protein shakes, bottled water, and soups  Pt instructed to assess status and suggestions daily utilizing Hydration Action Plan on discharge folder and to call [facility] if in the "red zone".     3. How much protein have you taken in the last day?     Pt states she is meeting the goal of 60g of protein each day with the protein shakes     4. Tell me what your incisions look like?   "Incisions look fine". Pt denies a fever, chills. Pt states incisions are not swollen, open, or draining. Pt encouraged to call [facility] if incisions change.     5. Have you been passing gas? BM?   Pt states that they are having BMs.   Pt states that they have had a BM. Pt instructed to take either Miralax or MoM as instructed per "Gastric Bypass/Sleeve Discharge Home Care Instructions". Pt to call surgeon's office if not able to have BM with medication.     6. If a problem or question were to arise who would you call? Do you know contact numbers for [facility], [facility], and [facility]?   Pt knows to call [facility] for surgical, [facility] for nutrition, and [facility] for non-urgent questions or concerns. Pt denies dehydration symptoms. Pt can describe s/sx of dehydration.   7. How has the walking going?   Pt states s/he is walking around and able to be active without difficulty.    8. How are your vitamins and calcium going? How are you taking them?     Pt states that s/he is taking his/her supplements and vitamins without difficulty.

## 2023-04-26 ENCOUNTER — Ambulatory Visit (HOSPITAL_BASED_OUTPATIENT_CLINIC_OR_DEPARTMENT_OTHER): Payer: 59 | Admitting: Cardiology

## 2023-04-26 ENCOUNTER — Encounter (HOSPITAL_BASED_OUTPATIENT_CLINIC_OR_DEPARTMENT_OTHER): Payer: Self-pay | Admitting: Cardiology

## 2023-04-26 VITALS — BP 128/78 | HR 76 | Ht 65.0 in | Wt 251.5 lb

## 2023-04-26 DIAGNOSIS — I493 Ventricular premature depolarization: Secondary | ICD-10-CM | POA: Diagnosis not present

## 2023-04-26 DIAGNOSIS — R002 Palpitations: Secondary | ICD-10-CM

## 2023-04-26 DIAGNOSIS — Z83430 Family history of elevated lipoprotein(a): Secondary | ICD-10-CM | POA: Insufficient documentation

## 2023-04-26 NOTE — Patient Instructions (Signed)
 Medication Instructions:  Your physician recommends that you continue on your current medications as directed. Please refer to the Current Medication list given to you today.  *If you need a refill on your cardiac medications before your next appointment, please call your pharmacy*   Follow-Up: At [facility], you and your health needs are our priority.  As part of our continuing mission to provide you with exceptional heart care, we have created designated Provider Care Teams.  These Care Teams include your primary Cardiologist (physician) and Advanced Practice Providers (APPs -  Physician Assistants and Nurse Practitioners) who all work together to provide you with the care you need, when you need it.  We recommend signing up for the patient portal called "MyChart".  Sign up information is provided on this After Visit Summary.  MyChart is used to connect with patients for Virtual Visits (Telemedicine).  Patients are able to view lab/test results, encounter notes, upcoming appointments, etc.  Non-urgent messages can be sent to your provider as well.   To learn more about what you can do with MyChart, go to [email or web address].    Your next appointment:   1 year with Dr. Cristal Deer

## 2023-04-26 NOTE — Progress Notes (Signed)
Cardiology Office Note:  .   Date:  04/26/2023  ID:  [name], DOB [birth year], MRN [id number] PCP: Tollie Eth, NP  [facility] Providers Cardiologist:  None {  History of Present Illness: Caroline Gonzales   ABRIELLE Gonzales is a 47 y.o. female with PMH palpitations and PVCs.  Pertinent CV history: Previously seen by Dr. Carolan Clines. Had monitor and echo in 2023 for PVCs, both tests unremarkable. She is s/p Roux-en-Y gastric bypass 03/2023.  Today: Is two weeks post gastric bypass. Doing well. Has not had significant PVCs/palpitations since January of this year. No clear triggers at that time. Lasted for days. Has not had any issues since that time.  Discussed QT prolonging meds, making sure she gets enough potassium and magnesium. QT is normal today.  ROS: Denies chest pain, shortness of breath at rest or with normal exertion. No PND, orthopnea, LE edema or unexpected weight gain. No syncope or palpitations. ROS otherwise negative except as noted.   Studies Reviewed: Caroline Gonzales    EKG:  EKG Interpretation Date/Time:  Monday April 26 2023 16:04:48 EDT Ventricular Rate:  76 PR Interval:  154 QRS Duration:  90 QT Interval:  390 QTC Calculation: 438 R Axis:   47  Text Interpretation: Normal sinus rhythm Normal ECG When compared with ECG of 24-Jul-2022 21:39, Premature ventricular complexes are no longer Present Sinus rhythm is no longer with junctional escape complexes Confirmed by Jodelle Red [phone]) on 04/26/2023 4:15:09 PM    Physical Exam:   VS:  BP 128/78 (BP Location: Left Arm, Patient Position: Sitting, Cuff Size: Large)   Pulse 76   Ht 5\' 5"  (1.651 m)   Wt 251 lb 8 oz (114.1 kg)   LMP 01/06/2021   BMI 41.85 kg/m    Wt Readings from Last 3 Encounters:  04/26/23 251 lb 8 oz (114.1 kg)  04/13/23 261 lb (118.4 kg)  03/31/23 266 lb (120.7 kg)    GEN: Well nourished, well developed in no acute distress HEENT: Normal, moist mucous membranes NECK: No JVD CARDIAC:  regular rhythm, normal S1 and S2, no rubs or gallops. No murmur. VASCULAR: Radial and DP pulses 2+ bilaterally. No carotid bruits RESPIRATORY:  Clear to auscultation without rales, wheezing or rhonchi  ABDOMEN: Soft, non-tender, non-distended MUSCULOSKELETAL:  Ambulates independently SKIN: Warm and dry, no edema NEUROLOGIC:  Alert and oriented x 3. No focal neuro deficits noted. PSYCHIATRIC:  Normal affect    ASSESSMENT AND PLAN: .   Palpitations PVCs -doing very well recently. Has not had recent symptoms -has apple watch, will catch strips and send to me if symptoms recur -if symptoms recur, check electrolytes and ECG (monitor Qtc)  Morbid obesity s/p gastric bypass 03/2023 -doing very well post op  Mother has elevated lp(a) -check at follow up  CV risk counseling and prevention -recommend heart healthy/Mediterranean diet, with whole grains, fruits, vegetable, fish, lean meats, nuts, and olive oil. Limit salt. -recommend moderate walking, 3-5 times/week for 30-50 minutes each session. Aim for at least 150 minutes.week. Goal should be pace of 3 miles/hours, or walking 1.5 miles in 30 minutes -recommend avoidance of tobacco products. Avoid excess alcohol. -ASCVD risk score: The 10-year ASCVD risk score (Arnett DK, et al., 2019) is: 1.7%   Values used to calculate the score:     Age: 41 years     Sex: Female     Is Non-Hispanic African American: No     Diabetic: No     Tobacco smoker:  No     Systolic Blood Pressure: 128 mmHg     Is BP treated: Yes     HDL Cholesterol: 33 mg/dL     Total Cholesterol: 149 mg/dL    Dispo: 1 year or sooner as needed  Signed, Jodelle Red, MD   Jodelle Red, MD, PhD, [facility] [place]  [facility]  [place]  Heart & Vascular at [facility] at [facility] [place] [address], [address] [place], [place] [address] [phone]

## 2023-04-27 ENCOUNTER — Encounter: Payer: Self-pay | Admitting: Dietician

## 2023-04-27 ENCOUNTER — Encounter: Payer: 59 | Attending: Surgery | Admitting: Dietician

## 2023-04-27 VITALS — Ht 65.0 in | Wt 246.3 lb

## 2023-04-27 DIAGNOSIS — E669 Obesity, unspecified: Secondary | ICD-10-CM | POA: Insufficient documentation

## 2023-04-27 DIAGNOSIS — F4310 Post-traumatic stress disorder, unspecified: Secondary | ICD-10-CM | POA: Diagnosis not present

## 2023-04-27 NOTE — Progress Notes (Signed)
2 Week Post-Operative Nutrition Class   Patient was seen on 04/27/2023 for Post-Operative Nutrition education at the [facility].    Surgery date: 04/13/2023 Surgery type: RYGB  Anthropometrics  Start weight at [facility]: 266.5 lbs (date: 12/22/2022)  Height: 65 in Weight today: 246.3 lb   Clinical   Pharmacotherapy: History of weight loss medication used: Wegovy (4 left)  Medical hx: GERD, hypercholesterolemia, obesity Medications: seroquel, luvox, vit D3, prazosin, nexium, zofran, propranolol, dlonopin, biotin, multivitamin colace, wegovy. Labs: triglycerides 357, HDL 33, VLDL 55 Notable signs/symptoms: none noted Any previous deficiencies? No Bowel Habits: Every day to every other day no complaints   Body Composition Scale 04/27/2023  Current Body Weight 246.3  Total Body Fat % 44.4  Visceral Fat 15  Fat-Free Mass % 55.5   Total Body Water % 42.2  Muscle-Mass lbs 32.4  BMI 40.6  Body Fat Displacement          Torso  lbs 67.8         Left Leg  lbs 13.5         Right Leg  lbs 13.5         Left Arm  lbs 6.7         Right Arm  lbs 6.7    The following the learning objectives were met by the patient during this course: Identifies Soft Prepped Plan Advancement Guide  Identifies Soft, High Proteins (Phase 1), beginning 2 weeks post-operatively to 3 weeks post-operatively Identifies Additional Soft High Proteins, soft non-starchy vegetables, fruits and starches (Phase 2), beginning 3 weeks post-operatively to 3 months post-operatively Identifies appropriate sources of fluids, proteins, vegetables, fruits and starches Identifies appropriate fat sources and healthy verses unhealthy fat types   States protein, vegetable, fruit and starch recommendations and appropriate sources post-operatively Identifies the need for appropriate texture modifications, mastication, and bite sizes when consuming solids Identifies appropriate fat consumption and  sources Identifies appropriate multivitamin and calcium sources post-operatively Describes the need for physical activity post-operatively and will follow MD recommendations States when to call healthcare provider regarding medication questions or post-operative complications   Handouts given during class include: Soft Prepped Plan Advancement Guide   Follow-Up Plan: Patient will follow-up at [facility] in 10 weeks for 3 month post-op nutrition visit for diet advancement per MD.

## 2023-05-06 ENCOUNTER — Telehealth: Payer: Self-pay | Admitting: Dietician

## 2023-05-06 NOTE — Telephone Encounter (Signed)
RD called pt to verify fluid intake once starting soft, solid proteins 2 week post-bariatric surgery.   Daily Fluid intake:  Daily Protein intake:  Bowel Habits:   Concerns/issues:    Left Voice Message 

## 2023-05-18 DIAGNOSIS — F4310 Post-traumatic stress disorder, unspecified: Secondary | ICD-10-CM | POA: Diagnosis not present

## 2023-05-19 ENCOUNTER — Other Ambulatory Visit (HOSPITAL_COMMUNITY): Payer: Self-pay

## 2023-05-25 ENCOUNTER — Ambulatory Visit (INDEPENDENT_AMBULATORY_CARE_PROVIDER_SITE_OTHER): Payer: 59 | Admitting: Physician Assistant

## 2023-05-26 ENCOUNTER — Ambulatory Visit (INDEPENDENT_AMBULATORY_CARE_PROVIDER_SITE_OTHER): Payer: 59 | Admitting: Physician Assistant

## 2023-05-26 ENCOUNTER — Encounter: Payer: Self-pay | Admitting: Physician Assistant

## 2023-05-26 DIAGNOSIS — F4310 Post-traumatic stress disorder, unspecified: Secondary | ICD-10-CM

## 2023-05-26 DIAGNOSIS — F3341 Major depressive disorder, recurrent, in partial remission: Secondary | ICD-10-CM

## 2023-05-26 DIAGNOSIS — G4733 Obstructive sleep apnea (adult) (pediatric): Secondary | ICD-10-CM | POA: Diagnosis not present

## 2023-05-26 DIAGNOSIS — G4700 Insomnia, unspecified: Secondary | ICD-10-CM

## 2023-05-26 DIAGNOSIS — F411 Generalized anxiety disorder: Secondary | ICD-10-CM | POA: Diagnosis not present

## 2023-05-26 MED ORDER — GABAPENTIN 300 MG PO CAPS: 600.0000 mg | ORAL_CAPSULE | Freq: Every day | ORAL | Status: DC

## 2023-05-26 NOTE — Patient Instructions (Signed)
On the gabapentin 300 mg start taking 2 pills every night instead of 3. In 2 weeks start taking 400 mg of the Seroquel every night. Leave everything else the same.

## 2023-05-26 NOTE — Progress Notes (Signed)
[facility]  Patient ID: Caroline Gonzales,  MRN: [id number]  PCP: Tollie Eth, NP  Date of Evaluation: 05/26/2023 Time spent:30 minutes  Chief Complaint:  Chief Complaint   Follow-up    HISTORY/CURRENT STATUS: HPI for routine follow-up.  Caroline Gonzales states she is doing really well.  She had bariatric surgery on 04/13/2023 and has lost over 35 pounds already.  She is feeling really well.  Is still getting used to the new way of eating.  Other than that she is had no problems after having the surgery.  She is wondering if we could decrease the Seroquel or the gabapentin, she thinks they may be too strong now that she has lost weight and will continue to lose more.  She still has anxiety, mostly at night.  Not having panic attacks but gets overwhelmed and is unable to fall asleep without the Klonopin.  She has a hard time getting her mind to turn off so she can relax to go to sleep.  No mid nocturnal awakening.  No nightmares.  No flashbacks reported.  Patient is able to enjoy things.  Energy and motivation are good.  Work is going well.   No extreme sadness, tearfulness, or feelings of hopelessness.  ADLs and personal hygiene are normal.   Denies any changes in concentration, making decisions, or remembering things.  Denies suicidal or homicidal thoughts.  Patient denies increased energy with decreased need for sleep, increased talkativeness, racing thoughts, impulsivity or risky behaviors, increased spending, increased libido, grandiosity, increased irritability or anger, paranoia, or hallucinations.  Denies dizziness, syncope, seizures, numbness, tingling, tremor, tics, unsteady gait, slurred speech, confusion. Denies muscle or joint pain, stiffness, or dystonia. Denies unexplained weight loss, frequent infections, or sores that heal slowly.  No polyphagia, polydipsia, or polyuria. Denies visual changes or paresthesias.   Individual Medical History/ Review of Systems: Changes?  :Yes    had bariatric surgery 04/13/2023  Past medications for mental health diagnoses include: Prozac, Paxil, Effexor, Norpramin, Wellbutrin, doxepin, Elavil, Geodon, Abilify, Lexapro, Trileptal, Ambien, Lunesta, Sonata, trazodone, Restoril, Remeron, Seroquel, Klonopin, propranolol, Lamictal, Deplin, Latuda, Xanax made her feel horrible, Ativan didn't help, prazosin    Allergies: Bacitracin, Nsaids, Penicillins, Tamiflu [oseltamivir phosphate], Cephalosporins, and Latex  Current Medications:  Current Outpatient Medications:    Biotin 5 MG TABS, Take by mouth., Disp: , Rfl:    cetirizine (ZYRTEC) 10 MG tablet, Take 10 mg by mouth at bedtime., Disp: , Rfl:    Cholecalciferol (VITAMIN D3) 5000 units CAPS, Take 5,000 Units by mouth at bedtime., Disp: , Rfl:    clonazePAM (KLONOPIN) 0.5 MG tablet, Take 1 tablet (0.5 mg) by mouth every 6 hours as needed for anxiety.  (up to 4 tablets daily), Disp: 120 tablet, Rfl: 5   fluvoxaMINE (LUVOX) 100 MG tablet, Take 1 tablet by mouth at bedtime. Take with 50 mg dose to equal 150 mg at bedtime, Disp: 90 tablet, Rfl: 3   fluvoxaMINE (LUVOX) 50 MG tablet, TAKE 1 TABLET BY MOUTH AT BEDTIME. TAKE IN ADDITION TO THE 100 MG TABLET TO EQUAL 150 MG TOTAL, Disp: 90 tablet, Rfl: 3   Multiple Vitamin (MULTIVITAMIN) capsule, Take 1 capsule by mouth daily. bariatric, Disp: , Rfl:    ondansetron (ZOFRAN-ODT) 8 MG disintegrating tablet, Dissolve 1 tablet by mouth every 8 hours as needed for nausea., Disp: 30 tablet, Rfl: 11   pantoprazole (PROTONIX) 40 MG tablet, Take 1 tablet (40 mg total) by mouth daily., Disp: 90 tablet, Rfl: 0  prazosin (MINIPRESS) 5 MG capsule, Take 2 capsules (10 mg total) by mouth at bedtime., Disp: 180 capsule, Rfl: 3   propranolol (INDERAL) 40 MG tablet, Take 1 tablet by mouth twice a day --May take an extra tablet in evening if needed (Patient taking differently: Take 40 mg by mouth See admin instructions. Take 40 mg (1 tablet) by nightly in  addition to 40 mg (1 tablet) as needed during the day.), Disp: 270 tablet, Rfl: 3   QUEtiapine (SEROQUEL) 200 MG tablet, Take 0.5-1 tablets (100-200 mg total) by mouth at bedtime. (Patient taking differently: Take 200 mg by mouth at bedtime.), Disp: 90 tablet, Rfl: 3   QUEtiapine (SEROQUEL) 300 MG tablet, Take 1 tablet by mouth at bedtime., Disp: 90 tablet, Rfl: 3   triamcinolone (NASACORT) 55 MCG/ACT AERO nasal inhaler, Place 1 spray into the nose at bedtime., Disp: , Rfl:    UNABLE TO FIND, Take 1 tablet by mouth at bedtime. Magtein, Disp: , Rfl:    gabapentin (NEURONTIN) 300 MG capsule, Take 2 capsules (600 mg total) by mouth at bedtime., Disp: , Rfl:  Medication Side Effects: none  Family Medical/ Social History: Changes?   Son has anxiety, depression, OCD, gender ID  MENTAL HEALTH EXAM:  Last menstrual period 01/06/2021.There is no height or weight on file to calculate BMI.  General Appearance: Casual, Well Groomed, and Obese  Eye Contact:  Good  Speech:  Clear and Coherent and Normal Rate  Volume:  Normal  Mood:  Euthymic  Affect:  Congruent  Thought Process:  Goal Directed and Descriptions of Associations: Circumstantial  Orientation:  Full (Time, Place, and Person)  Thought Content: Logical   Suicidal Thoughts:  No  Homicidal Thoughts:  No  Memory:  WNL  Judgement:  Good  Insight:  Good  Psychomotor Activity:  Normal  Concentration:  Concentration: Good and Attention Span: Good  Recall:  Good  Fund of Knowledge: Good  Language: Good  Assets:  Communication Skills Desire for Improvement Financial Resources/Insurance Housing Resilience Transportation Vocational/Educational  ADL's:  Intact  Cognition: WNL  Prognosis:  Good   PCP follows labs, see records from hospitalization  DIAGNOSES:    ICD-10-CM   1. PTSD (post-traumatic stress disorder)  F43.10     2. Recurrent major depressive disorder, in partial remission (HCC)  F33.41     3. Generalized anxiety  disorder  F41.1     4. Insomnia, unspecified type  G47.00     5. Obstructive sleep apnea  G47.33      Receiving Psychotherapy: Yes  Caroline Gonzales  RECOMMENDATIONS:  PDMP was reviewed.  Last Klonopin filled 04/20/2023. I provided 30 minutes of face to face time during this encounter, including time spent before and after the visit in records review, medical decision making, counseling pertinent to today's visit, and charting.   I am glad to see her doing well after the surgery!  Encouraged her to keep up the good work.  It is fine to decrease the gabapentin or Seroquel but I do not recommend doing it at exactly the same time.  She understands that if something goes awry we will not know which change is the cause.    Change the doses as follows: On the gabapentin 300 mg start taking 2 pills every night instead of 3. In 2 weeks start taking 400 mg of the Seroquel every night.  (The medication list does not reflect this in case we needed to keep it at the same dose, which we  will discuss at her next visit.)  Continue Klonopin 0.5 mg, 1 p.o. 4 times daily as needed. (She takes 3 at bedtime routinely, and 1 every day prn.  Continue Luvox 150 mg nightly. Continue prazosin 5 mg, 2 po qhs.  Continue propranolol 40 mg, 1 p.o. bid.  Continue counseling with Caroline Gonzales. Return in 4 weeks.  Melony Overly, PA-C

## 2023-06-02 ENCOUNTER — Other Ambulatory Visit: Payer: Self-pay

## 2023-06-02 ENCOUNTER — Other Ambulatory Visit (HOSPITAL_COMMUNITY): Payer: Self-pay

## 2023-06-02 DIAGNOSIS — F4310 Post-traumatic stress disorder, unspecified: Secondary | ICD-10-CM | POA: Diagnosis not present

## 2023-06-07 DIAGNOSIS — F4310 Post-traumatic stress disorder, unspecified: Secondary | ICD-10-CM | POA: Diagnosis not present

## 2023-06-14 DIAGNOSIS — F4310 Post-traumatic stress disorder, unspecified: Secondary | ICD-10-CM | POA: Diagnosis not present

## 2023-06-15 ENCOUNTER — Other Ambulatory Visit: Payer: Self-pay

## 2023-06-15 ENCOUNTER — Other Ambulatory Visit (HOSPITAL_COMMUNITY): Payer: Self-pay

## 2023-06-17 ENCOUNTER — Other Ambulatory Visit (HOSPITAL_COMMUNITY): Payer: Self-pay

## 2023-06-21 ENCOUNTER — Encounter: Payer: Self-pay | Admitting: Nurse Practitioner

## 2023-06-22 ENCOUNTER — Other Ambulatory Visit: Payer: Self-pay

## 2023-06-22 DIAGNOSIS — H9193 Unspecified hearing loss, bilateral: Secondary | ICD-10-CM

## 2023-06-25 ENCOUNTER — Ambulatory Visit (INDEPENDENT_AMBULATORY_CARE_PROVIDER_SITE_OTHER): Payer: 59 | Admitting: Physician Assistant

## 2023-06-25 ENCOUNTER — Encounter: Payer: Self-pay | Admitting: Physician Assistant

## 2023-06-25 ENCOUNTER — Other Ambulatory Visit (HOSPITAL_COMMUNITY): Payer: Self-pay

## 2023-06-25 DIAGNOSIS — F3341 Major depressive disorder, recurrent, in partial remission: Secondary | ICD-10-CM

## 2023-06-25 DIAGNOSIS — G4733 Obstructive sleep apnea (adult) (pediatric): Secondary | ICD-10-CM

## 2023-06-25 DIAGNOSIS — F515 Nightmare disorder: Secondary | ICD-10-CM

## 2023-06-25 DIAGNOSIS — F4310 Post-traumatic stress disorder, unspecified: Secondary | ICD-10-CM | POA: Diagnosis not present

## 2023-06-25 DIAGNOSIS — G4700 Insomnia, unspecified: Secondary | ICD-10-CM

## 2023-06-25 DIAGNOSIS — F411 Generalized anxiety disorder: Secondary | ICD-10-CM | POA: Diagnosis not present

## 2023-06-25 MED ORDER — QUETIAPINE FUMARATE 200 MG PO TABS
400.0000 mg | ORAL_TABLET | Freq: Every day | ORAL | 1 refills | Status: DC
  Filled 2023-06-25 – 2023-07-01 (×2): qty 180, 90d supply, fill #0
  Filled 2023-09-27: qty 180, 90d supply, fill #1

## 2023-06-25 NOTE — Progress Notes (Signed)
[facility]  Patient ID: REIN REINING,  MRN: [id number]  PCP: Tollie Eth, NP  Date of Evaluation: 06/25/2023 Time spent:20 minutes  Chief Complaint:  Chief Complaint   Follow-up    HISTORY/CURRENT STATUS: HPI for routine follow-up.  She didn't tolerate the med changes we made at [facility]. Had worsening anx and felt more down. Feels stable with things the way they are. Work is going fine. Busy but good.  No extreme sadness, tearfulness, or feelings of hopelessness.  Prazosin and Seroquel are working well for sleep and nightmare control. ADLs and personal hygiene are normal.   Denies any changes in concentration, making decisions, or remembering things.  Appetite has not changed.  Weight is stable. No PA. Klonopin helps when she gets extremely overwhelmed.  Denies suicidal or homicidal thoughts.  Patient denies increased energy with decreased need for sleep, increased talkativeness, racing thoughts, impulsivity or risky behaviors, increased spending, increased libido, grandiosity, increased irritability or anger, paranoia, or hallucinations.  Denies dizziness, syncope, seizures, numbness, tingling, tremor, tics, unsteady gait, slurred speech, confusion. Denies muscle or joint pain, stiffness, or dystonia. Denies unexplained weight loss, frequent infections, or sores that heal slowly.  No polyphagia, polydipsia, or polyuria. Denies visual changes or paresthesias.   Individual Medical History/ Review of Systems: Changes? :Yes     Past medications for mental health diagnoses include: Prozac, Paxil, Effexor, Norpramin, Wellbutrin, doxepin, Elavil, Geodon, Abilify, Lexapro, Trileptal, Ambien, Lunesta, Sonata, trazodone, Restoril, Remeron, Seroquel, Klonopin, propranolol, Lamictal, Deplin, Latuda, Xanax made her feel horrible, Ativan didn't help, prazosin   Allergies: Bacitracin, Nsaids, Penicillins, Tamiflu [oseltamivir phosphate], Cephalosporins, and Latex  Current  Medications:  Current Outpatient Medications:    Biotin 5 MG TABS, Take by mouth., Disp: , Rfl:    cetirizine (ZYRTEC) 10 MG tablet, Take 10 mg by mouth at bedtime., Disp: , Rfl:    Cholecalciferol (VITAMIN D3) 5000 units CAPS, Take 5,000 Units by mouth at bedtime., Disp: , Rfl:    clonazePAM (KLONOPIN) 0.5 MG tablet, Take 1 tablet (0.5 mg) by mouth every 6 hours as needed for anxiety.  (up to 4 tablets daily), Disp: 120 tablet, Rfl: 5   fluvoxaMINE (LUVOX) 100 MG tablet, Take 1 tablet by mouth at bedtime. Take with 50 mg dose to equal 150 mg at bedtime, Disp: 90 tablet, Rfl: 3   fluvoxaMINE (LUVOX) 50 MG tablet, TAKE 1 TABLET BY MOUTH AT BEDTIME. TAKE IN ADDITION TO THE 100 MG TABLET TO EQUAL 150 MG TOTAL, Disp: 90 tablet, Rfl: 3   gabapentin (NEURONTIN) 300 MG capsule, Take 2 capsules (600 mg total) by mouth at bedtime. (Patient taking differently: Take 900 mg by mouth at bedtime.), Disp: , Rfl:    Multiple Vitamin (MULTIVITAMIN) capsule, Take 1 capsule by mouth daily. bariatric, Disp: , Rfl:    ondansetron (ZOFRAN-ODT) 8 MG disintegrating tablet, Dissolve 1 tablet by mouth every 8 hours as needed for nausea., Disp: 30 tablet, Rfl: 11   pantoprazole (PROTONIX) 40 MG tablet, Take 1 tablet (40 mg total) by mouth daily., Disp: 90 tablet, Rfl: 0   prazosin (MINIPRESS) 5 MG capsule, Take 2 capsules (10 mg total) by mouth at bedtime., Disp: 180 capsule, Rfl: 3   propranolol (INDERAL) 40 MG tablet, Take 1 tablet by mouth twice a day --May take an extra tablet in evening if needed (Patient taking differently: Take 40 mg by mouth See admin instructions. Take 40 mg (1 tablet) by nightly in addition to 40 mg (1 tablet) as needed during  the day.), Disp: 270 tablet, Rfl: 3   triamcinolone (NASACORT) 55 MCG/ACT AERO nasal inhaler, Place 1 spray into the nose at bedtime., Disp: , Rfl:    UNABLE TO FIND, Take 1 tablet by mouth at bedtime. Magtein, Disp: , Rfl:    pantoprazole (PROTONIX) 40 MG tablet, Take 1 tablet  (40 mg total) by mouth once daily, Disp: 90 tablet, Rfl: 0   QUEtiapine (SEROQUEL) 200 MG tablet, Take 2 tablets (400 mg total) by mouth at bedtime., Disp: 180 tablet, Rfl: 1 Medication Side Effects: none  Family Medical/ Social History: Changes?   Son has anxiety, depression, OCD, gender ID  MENTAL HEALTH EXAM:  Last menstrual period 01/06/2021.There is no height or weight on file to calculate BMI.  General Appearance: Casual, Well Groomed, and Obese  Eye Contact:  Good  Speech:  Clear and Coherent and Normal Rate  Volume:  Normal  Mood:  Euthymic  Affect:  Congruent  Thought Process:  Goal Directed and Descriptions of Associations: Circumstantial  Orientation:  Full (Time, Place, and Person)  Thought Content: Logical   Suicidal Thoughts:  No  Homicidal Thoughts:  No  Memory:  WNL  Judgement:  Good  Insight:  Good  Psychomotor Activity:  Normal  Concentration:  Concentration: Good and Attention Span: Good  Recall:  Good  Fund of Knowledge: Good  Language: Good  Assets:  Communication Skills Desire for Improvement Financial Resources/Insurance Housing Physical Health Resilience Transportation Vocational/Educational  ADL's:  Intact  Cognition: WNL  Prognosis:  Good   PCP follows labs  DIAGNOSES:    ICD-10-CM   1. Recurrent major depressive disorder, in partial remission (HCC)  F33.41     2. PTSD (post-traumatic stress disorder)  F43.10     3. Generalized anxiety disorder  F41.1     4. Insomnia, unspecified type  G47.00     5. Obstructive sleep apnea  G47.33     6. Nightmares  F51.5       Receiving Psychotherapy: Yes  Heather Mask  RECOMMENDATIONS:  PDMP was reviewed.  Last Klonopin filled 06/02/2023.   I provided 20 minutes of face to face time during this encounter, including time spent before and after the visit in records review, medical decision making, counseling pertinent to today's visit, and charting.   For the most part, she's doing well.  Recommend no changes at this time.   Continue Klonopin 0.5 mg, 1 p.o. 4 times daily as needed. (She takes 3 at bedtime routinely, and 1 every day prn.  Continue Luvox 150 mg nightly. Continue Gabapentin 300 mg, 3 at bedtime. Continue prazosin 5 mg, 2 po qhs.  Continue propranolol 40 mg, 1 p.o. bid.  Continue Seroquel 200 mg, 2 at bedtime.  Continue counseling with Heather Mask. Return in 6-8 weeks.  Melony Overly, PA-C

## 2023-07-01 ENCOUNTER — Other Ambulatory Visit (HOSPITAL_COMMUNITY): Payer: Self-pay

## 2023-07-05 DIAGNOSIS — F4310 Post-traumatic stress disorder, unspecified: Secondary | ICD-10-CM | POA: Diagnosis not present

## 2023-07-06 ENCOUNTER — Other Ambulatory Visit (HOSPITAL_COMMUNITY): Payer: Self-pay

## 2023-07-09 ENCOUNTER — Other Ambulatory Visit (HOSPITAL_COMMUNITY): Payer: Self-pay

## 2023-07-09 MED ORDER — PANTOPRAZOLE SODIUM 40 MG PO TBEC
DELAYED_RELEASE_TABLET | ORAL | 0 refills | Status: DC
  Filled 2023-07-09: qty 90, 90d supply, fill #0

## 2023-07-12 ENCOUNTER — Other Ambulatory Visit (HOSPITAL_COMMUNITY): Payer: Self-pay

## 2023-07-14 ENCOUNTER — Other Ambulatory Visit: Payer: Self-pay | Admitting: Physician Assistant

## 2023-07-14 ENCOUNTER — Other Ambulatory Visit (HOSPITAL_COMMUNITY): Payer: Self-pay

## 2023-07-14 MED ORDER — GABAPENTIN 300 MG PO CAPS
900.0000 mg | ORAL_CAPSULE | Freq: Every day | ORAL | 1 refills | Status: DC
  Filled 2023-07-14 – 2023-10-13 (×2): qty 270, 90d supply, fill #0
  Filled 2024-01-07: qty 270, 90d supply, fill #1

## 2023-07-15 ENCOUNTER — Encounter: Payer: 59 | Attending: Surgery | Admitting: Dietician

## 2023-07-15 ENCOUNTER — Other Ambulatory Visit (HOSPITAL_COMMUNITY): Payer: Self-pay

## 2023-07-15 ENCOUNTER — Encounter: Payer: Self-pay | Admitting: Dietician

## 2023-07-15 VITALS — Ht 65.0 in | Wt 223.0 lb

## 2023-07-15 DIAGNOSIS — E669 Obesity, unspecified: Secondary | ICD-10-CM | POA: Insufficient documentation

## 2023-07-15 NOTE — Progress Notes (Signed)
Bariatric Nutrition Follow-Up Visit Medical Nutrition Therapy  Appt Start Time: 1:58   End Time: 2:27  Surgery date: 04/13/2023 Surgery type: RYGB  NUTRITION ASSESSMENT  Anthropometrics  Start weight at [facility]: 266.5 lbs (date: 12/22/2022)  Height: 65 in Weight today: 223.0 lb   Clinical   Pharmacotherapy: History of weight loss medication used: Wegovy (4 left)  Medical hx: GERD, hypercholesterolemia, obesity, vit D deficiency Medications: seroquel, luvox, vit D3, prazosin, nexium, zofran, propranolol, dlonopin, biotin, multivitamin colace, wegovy. Labs: triglycerides 357, HDL 33, VLDL 55 Notable signs/symptoms: none noted Any previous deficiencies? No Bowel Habits: Every day to every other day no complaints   Body Composition Scale 04/27/2023 07/15/2023  Current Body Weight 246.3 223.0  Total Body Fat % 44.4 41.8  Visceral Fat 15 13  Fat-Free Mass % 55.5 58.1   Total Body Water % 42.2 43.5  Muscle-Mass lbs 32.4 32.1  BMI 40.6 36.8  Body Fat Displacement           Torso  lbs 67.8 57.7         Left Leg  lbs 13.5 11.5         Right Leg  lbs 13.5 11.5         Left Arm  lbs 6.7 5.7         Right Arm  lbs 6.7 5.7     Lifestyle & Dietary Hx  Pt states she feels nauseous when she gets to full (past satisfaction). Pt states one time she had a emesis episode when she drank water too soon after eating.  Estimated daily fluid intake: 65-70 oz Estimated daily protein intake: average of 60 g Supplements: multivitamin and calcium Current average weekly physical activity: ADL (couple flights of stairs and active at work)   24-Hr Dietary Recall First Meal: cheese and/or egg Snack: 8-10 wheat thins  Second Meal: [place] or chicken in pouch Snack: purple grapes or nuts Third Meal: chicken Snack: freeze dried bananas or frozen purple grapes, yasso bar Beverages: weak herbal peppermint tea, water  Post-Op Goals/ Signs/ Symptoms Using straws: no Drinking while eating:  no Chewing/swallowing difficulties: no Changes in vision: no Changes to mood/headaches: no Hair loss/changes to skin/nails: no Difficulty focusing/concentrating: no Sweating: no Limb weakness: no Dizziness/lightheadedness: no Palpitations: no  Carbonated/caffeinated beverages: no N/V/D/C/Gas: some nausea when eating one more bit to fullness Abdominal pain: no Dumping syndrome: no    NUTRITION DIAGNOSIS  Overweight/obesity ([place]-3.3) related to past poor dietary habits and physical inactivity as evidenced by completed bariatric surgery and following dietary guidelines for continued weight loss and healthy nutrition status.     NUTRITION INTERVENTION Nutrition counseling (C-1) and education (E-2) to facilitate bariatric surgery goals, including: Diet advancement to the standard prep plan The importance of consuming adequate calories as well as certain nutrients daily due to the body's need for essential vitamins, minerals, and fats The importance of daily physical activity and to reach a goal of at least 150 minutes of moderate to vigorous physical activity weekly (or as directed by their physician) due to benefits such as increased musculature and improved lab values The importance of intuitive eating specifically learning hunger-satiety cues and understanding the importance of learning a new body: The importance of mindful eating to avoid grazing behaviors   Goals Increase physical activity; aim for 15 minutes 5 days a week; walk or stairs during break at work.  Handouts Provided Include  Standard Prep Plan Advancement Guide  Learning Style & Readiness for Change Teaching method  utilized: Special educational needs teacher  Demonstrated degree of understanding via: Teach Back  Readiness Level: action Barriers to learning/adherence to lifestyle change: nothing identified  RD's Notes for Next Visit Assess adherence to pt chosen goals  MONITORING & EVALUATION Dietary intake, weekly physical  activity, body weight.  Next Steps Patient is to follow-up in 3 months for 6 month post-op follow-up.

## 2023-07-19 DIAGNOSIS — F4310 Post-traumatic stress disorder, unspecified: Secondary | ICD-10-CM | POA: Diagnosis not present

## 2023-07-21 ENCOUNTER — Other Ambulatory Visit: Payer: Self-pay | Admitting: Nurse Practitioner

## 2023-07-21 DIAGNOSIS — Z1231 Encounter for screening mammogram for malignant neoplasm of breast: Secondary | ICD-10-CM

## 2023-07-27 DIAGNOSIS — F4310 Post-traumatic stress disorder, unspecified: Secondary | ICD-10-CM | POA: Diagnosis not present

## 2023-07-28 ENCOUNTER — Ambulatory Visit: Payer: 59 | Attending: Nurse Practitioner | Admitting: Audiologist

## 2023-07-28 DIAGNOSIS — H9193 Unspecified hearing loss, bilateral: Secondary | ICD-10-CM | POA: Diagnosis not present

## 2023-07-28 NOTE — Procedures (Signed)
  Outpatient Audiology and [facility] [address] [place], [place]  [address] [phone]  AUDIOLOGICAL  EVALUATION  NAME: Caroline Gonzales     DOB:   05/19/1976      MRN: [id number]                                                                                     DATE: 07/28/2023     REFERENT: Tollie Eth, NP STATUS: Outpatient DIAGNOSIS: Sensorineural Hearing Loss Bilateral    History: [name] was seen for an audiological evaluation due to her family's perception she is not hearing well. [name] is saying 'what' more than she used to. She has occasional ringing in each ear. She denies any pain or pressure. She has no concerns for her hearing but wants to get it checked. No history of occupational noise. No medical warning signs for hearing loss.    Evaluation:  Otoscopy showed a clear view of the tympanic membranes, bilaterally Tympanometry results were consistent with normal middle ear function Audiometric testing was completed using Conventional Audiometry techniques with insert earphones and TDH headphones. Test results are consistent with mild sensorineural hearing loss bilaterally. Speech Recognition Thresholds were obtained at 25dB HL in the right ear and at 25dB HL in the left ear. Word Recognition Testing was completed at 65dB HL and [name] scored 100% in each ear with  masking.    Results:  The test results were reviewed with [name]. She has a mild hearing loss in each ear at high pitches. She may not hear people clearly from a distance. The mild degree of loss does not make her a hearing aid candidate.   Recommendations: 1.   Recommend face to face communication and retest hearing every few years to monitor for progression.   23 minutes spent testing and counseling on results.   If you have any questions please feel free to contact me at [phone].  Ammie Ferrier Au.D.  Audiologist   07/28/2023  3:03 PM  Cc: Tollie Eth, NP

## 2023-07-30 DIAGNOSIS — Z9884 Bariatric surgery status: Secondary | ICD-10-CM | POA: Diagnosis not present

## 2023-08-03 ENCOUNTER — Other Ambulatory Visit: Payer: Self-pay

## 2023-08-03 DIAGNOSIS — F4310 Post-traumatic stress disorder, unspecified: Secondary | ICD-10-CM | POA: Diagnosis not present

## 2023-08-06 ENCOUNTER — Encounter: Payer: Self-pay | Admitting: Physician Assistant

## 2023-08-06 ENCOUNTER — Ambulatory Visit (INDEPENDENT_AMBULATORY_CARE_PROVIDER_SITE_OTHER): Payer: 59 | Admitting: Physician Assistant

## 2023-08-06 DIAGNOSIS — G4733 Obstructive sleep apnea (adult) (pediatric): Secondary | ICD-10-CM

## 2023-08-06 DIAGNOSIS — F3341 Major depressive disorder, recurrent, in partial remission: Secondary | ICD-10-CM

## 2023-08-06 DIAGNOSIS — F411 Generalized anxiety disorder: Secondary | ICD-10-CM | POA: Diagnosis not present

## 2023-08-06 DIAGNOSIS — F4310 Post-traumatic stress disorder, unspecified: Secondary | ICD-10-CM

## 2023-08-06 DIAGNOSIS — G4700 Insomnia, unspecified: Secondary | ICD-10-CM

## 2023-08-06 NOTE — Progress Notes (Unsigned)
[facility]  Patient ID: DENYLA TAM,  MRN: [id number]  PCP: Tollie Eth, NP  Date of Evaluation: 08/06/2023 Time spent:20 minutes  Chief Complaint:  Chief Complaint   Follow-up    HISTORY/CURRENT STATUS: HPI for routine follow-up.  Cried a few times this past week, was triggered though.  The results of the election has been very disturbing.  She still has anxiety, gets overwhelmed frequently, takes the Klonopin which is beneficial.  Not having panic attacks.  She was not able to tolerate a lower dose of gabapentin or the Seroquel, so increase the dose per my directions.  Patient is able to enjoy things.  Energy and motivation are good.  Work is going well.   No feelings of hopelessness.  She usually sleeps well and is not having nightmares.  Uses her CPAP.  ADLs and personal hygiene are normal.   Denies any changes in concentration, making decisions, or remembering things.  Her appetite is stable.  She has lost approximately 100 pounds since having bariatric surgery.  Denies suicidal or homicidal thoughts.  Patient denies increased energy with decreased need for sleep, increased talkativeness, racing thoughts, impulsivity or risky behaviors, increased spending, increased libido, grandiosity, increased irritability or anger, paranoia, or hallucinations.  Denies dizziness, syncope, seizures, numbness, tingling, tremor, tics, unsteady gait, slurred speech, confusion. Denies muscle or joint pain, stiffness, or dystonia. Denies unexplained weight loss, frequent infections, or sores that heal slowly.  No polyphagia, polydipsia, or polyuria. Denies visual changes or paresthesias.   Individual Medical History/ Review of Systems: Changes? :No     Past medications for mental health diagnoses include: Prozac, Paxil, Effexor, Norpramin, Wellbutrin, doxepin, Elavil, Geodon, Abilify, Lexapro, Trileptal, Ambien, Lunesta, Sonata, trazodone, Restoril, Remeron, Seroquel, Klonopin,  propranolol, Lamictal, Deplin, Latuda, Xanax made her feel horrible, Ativan didn't help, prazosin   Allergies: Bacitracin, Nsaids, Penicillins, Tamiflu [oseltamivir phosphate], Cephalosporins, and Latex  Current Medications:  Current Outpatient Medications:    Biotin 5 MG TABS, Take by mouth., Disp: , Rfl:    cetirizine (ZYRTEC) 10 MG tablet, Take 10 mg by mouth at bedtime., Disp: , Rfl:    Cholecalciferol (VITAMIN D3) 5000 units CAPS, Take 5,000 Units by mouth at bedtime., Disp: , Rfl:    clonazePAM (KLONOPIN) 0.5 MG tablet, Take 1 tablet (0.5 mg) by mouth every 6 hours as needed for anxiety.  (up to 4 tablets daily), Disp: 120 tablet, Rfl: 5   fluvoxaMINE (LUVOX) 100 MG tablet, Take 1 tablet by mouth at bedtime. Take with 50 mg dose to equal 150 mg at bedtime, Disp: 90 tablet, Rfl: 3   fluvoxaMINE (LUVOX) 50 MG tablet, TAKE 1 TABLET BY MOUTH AT BEDTIME. TAKE IN ADDITION TO THE 100 MG TABLET TO EQUAL 150 MG TOTAL, Disp: 90 tablet, Rfl: 3   gabapentin (NEURONTIN) 300 MG capsule, Take 3 capsules (900 mg total) by mouth at bedtime., Disp: 270 capsule, Rfl: 1   Multiple Vitamin (MULTIVITAMIN) capsule, Take 1 capsule by mouth daily. bariatric, Disp: , Rfl:    ondansetron (ZOFRAN-ODT) 8 MG disintegrating tablet, Dissolve 1 tablet by mouth every 8 hours as needed for nausea., Disp: 30 tablet, Rfl: 11   pantoprazole (PROTONIX) 40 MG tablet, Take 1 tablet (40 mg total) by mouth daily., Disp: 90 tablet, Rfl: 0   pantoprazole (PROTONIX) 40 MG tablet, Take 1 tablet (40 mg total) by mouth once daily, Disp: 90 tablet, Rfl: 0   prazosin (MINIPRESS) 5 MG capsule, Take 2 capsules (10 mg total) by mouth at  bedtime., Disp: 180 capsule, Rfl: 3   propranolol (INDERAL) 40 MG tablet, Take 1 tablet by mouth twice a day --May take an extra tablet in evening if needed (Patient taking differently: Take 40 mg by mouth See admin instructions. Take 40 mg (1 tablet) by nightly in addition to 40 mg (1 tablet) as needed during  the day.), Disp: 270 tablet, Rfl: 3   QUEtiapine (SEROQUEL) 200 MG tablet, Take 2 tablets (400 mg total) by mouth at bedtime., Disp: 180 tablet, Rfl: 1   triamcinolone (NASACORT) 55 MCG/ACT AERO nasal inhaler, Place 1 spray into the nose at bedtime., Disp: , Rfl:    UNABLE TO FIND, Take 1 tablet by mouth at bedtime. Magtein, Disp: , Rfl:  Medication Side Effects: none  Family Medical/ Social History: Changes?   Son has a boyfriend now.   MENTAL HEALTH EXAM:  Last menstrual period 01/06/2021.There is no height or weight on file to calculate BMI.  General Appearance: Casual, Well Groomed, and Obese  Eye Contact:  Good  Speech:  Clear and Coherent and Normal Rate  Volume:  Normal  Mood:  Euthymic  Affect:  Congruent  Thought Process:  Goal Directed and Descriptions of Associations: Circumstantial  Orientation:  Full (Time, Place, and Person)  Thought Content: Logical   Suicidal Thoughts:  No  Homicidal Thoughts:  No  Memory:  WNL  Judgement:  Good  Insight:  Good  Psychomotor Activity:  Normal  Concentration:  Concentration: Good and Attention Span: Good  Recall:  Good  Fund of Knowledge: Good  Language: Good  Assets:  Communication Skills Desire for Improvement Financial Resources/Insurance Housing Resilience Transportation Vocational/Educational  ADL's:  Intact  Cognition: WNL  Prognosis:  Good   PCP follows labs  DIAGNOSES:    ICD-10-CM   1. Recurrent major depressive disorder, in partial remission (HCC)  F33.41     2. PTSD (post-traumatic stress disorder)  F43.10     3. Generalized anxiety disorder  F41.1     4. Insomnia, unspecified type  G47.00     5. Obstructive sleep apnea  G47.33      Receiving Psychotherapy: Yes  Heather Mask  RECOMMENDATIONS:  PDMP was reviewed.  Last Klonopin filled to 07/12/2023.  The gabapentin filled 07/14/2023.   I provided 20 minutes of face to face time during this encounter, including time spent before and after the visit in  records review, medical decision making, counseling pertinent to today's visit, and charting.   As far as her mental health medications go she is stable.  We agree that it is not a good time to make any medication changes.  Our goal would be to decrease doses or even eliminate some of her medications if at all possible in the future.  Continue Klonopin 0.5 mg, 1 p.o. 4 times daily as needed. (She takes 3 at bedtime routinely, and 1 every day prn.  Continue Luvox 150 mg nightly. Continue Gabapentin 300 mg, 3 at bedtime. Continue prazosin 5 mg, 2 po qhs.  Continue propranolol 40 mg, 1 p.o. bid.  Continue Seroquel 200 mg, 2 at bedtime.  Continue counseling with Heather Mask. Return in 2 months.  Melony Overly, PA-C

## 2023-08-09 ENCOUNTER — Ambulatory Visit: Payer: 59 | Admitting: Dermatology

## 2023-08-09 ENCOUNTER — Encounter: Payer: Self-pay | Admitting: Dermatology

## 2023-08-09 ENCOUNTER — Other Ambulatory Visit: Payer: Self-pay

## 2023-08-09 DIAGNOSIS — Z808 Family history of malignant neoplasm of other organs or systems: Secondary | ICD-10-CM | POA: Diagnosis not present

## 2023-08-09 DIAGNOSIS — L700 Acne vulgaris: Secondary | ICD-10-CM | POA: Diagnosis not present

## 2023-08-09 DIAGNOSIS — L578 Other skin changes due to chronic exposure to nonionizing radiation: Secondary | ICD-10-CM | POA: Diagnosis not present

## 2023-08-09 DIAGNOSIS — Z1283 Encounter for screening for malignant neoplasm of skin: Secondary | ICD-10-CM

## 2023-08-09 DIAGNOSIS — L821 Other seborrheic keratosis: Secondary | ICD-10-CM

## 2023-08-09 DIAGNOSIS — D2372 Other benign neoplasm of skin of left lower limb, including hip: Secondary | ICD-10-CM | POA: Diagnosis not present

## 2023-08-09 DIAGNOSIS — L814 Other melanin hyperpigmentation: Secondary | ICD-10-CM

## 2023-08-09 DIAGNOSIS — W908XXA Exposure to other nonionizing radiation, initial encounter: Secondary | ICD-10-CM

## 2023-08-09 DIAGNOSIS — D1801 Hemangioma of skin and subcutaneous tissue: Secondary | ICD-10-CM | POA: Diagnosis not present

## 2023-08-09 DIAGNOSIS — D229 Melanocytic nevi, unspecified: Secondary | ICD-10-CM

## 2023-08-09 NOTE — Patient Instructions (Signed)

## 2023-08-09 NOTE — Progress Notes (Signed)
   New Patient Visit   Subjective  Caroline Gonzales is a 47 y.o. female who presents for the following: New Pt - TBSE  Patient states she has moles located at the scattered that she would like to have examined. Patient reports she has previously been treated for these areas by dermatology. Patient reports Hx of bx. Patient reports family history of skin cancer(s)(mother - SCC, maternal grandmother - SCC).  The patient has spots, moles and lesions to be evaluated, some may be new or changing and the patient may have concern these could be cancer.   The following portions of the chart were reviewed this encounter and updated as appropriate: medications, allergies, medical history  Review of Systems:  No other skin or systemic complaints except as noted in HPI or Assessment and Plan.  Objective  Well appearing patient in no apparent distress; mood and affect are within normal limits.    A focused examination was performed of the following areas: TBSE   Relevant exam findings are noted in the Assessment and Plan.   Assessment & Plan   LENTIGINES, SEBORRHEIC KERATOSES, HEMANGIOMAS - Benign normal skin lesions - Benign-appearing - Call for any changes  BENIGN MELANOCYTIC NEVI - Tan-brown and/or pink-flesh-colored symmetric macules and papules - Benign appearing on exam today - Observation - Call clinic for new or changing moles - Recommend daily use of broad spectrum spf 30+ sunscreen to sun-exposed areas.   MILD ACTINIC DAMAGE - Chronic condition, secondary to cumulative UV/sun exposure - diffuse scaly erythematous macules with underlying dyspigmentation - Recommend daily broad spectrum sunscreen SPF 30+ to sun-exposed areas, reapply every 2 hours as needed.  - Staying in the shade or wearing long sleeves, sun glasses (UVA+UVB protection) and wide brim hats (4-inch brim around the entire circumference of the hat) are also recommended for sun protection.  - Call for new or  changing lesions.  DERMATOFIBROMA Exam: Firm pink/brown papulenodule with dimple sign located on left shin Treatment Plan: A dermatofibroma is a benign growth possibly related to trauma, such as an insect bite, cut from shaving, or inflamed acne-type bump.  Treatment options to remove include shave or excision with resulting scar and risk of recurrence.  Since benign-appearing and not bothersome, will observe for now.   SKIN CANCER SCREENING PERFORMED TODAY  OPEN COMEDONES Exam: blackhead on BL axillae  Treatment Plan: - Recommended "The Ordinary" Brand Glycolic acid toner    No follow-ups on file.    Documentation: I have reviewed the above documentation for accuracy and completeness, and I agree with the above.   I, Shirron Marcha Solders, CMA, am acting as scribe for [employer], DO.   Langston Reusing, DO

## 2023-08-10 DIAGNOSIS — F4310 Post-traumatic stress disorder, unspecified: Secondary | ICD-10-CM | POA: Diagnosis not present

## 2023-08-13 ENCOUNTER — Ambulatory Visit (INDEPENDENT_AMBULATORY_CARE_PROVIDER_SITE_OTHER): Payer: PRIVATE HEALTH INSURANCE

## 2023-08-13 ENCOUNTER — Ambulatory Visit (HOSPITAL_COMMUNITY)
Admission: EM | Admit: 2023-08-13 | Discharge: 2023-08-13 | Disposition: A | Discharge status: Home / Self Care | Payer: PRIVATE HEALTH INSURANCE | Attending: Emergency Medicine | Admitting: Emergency Medicine

## 2023-08-13 ENCOUNTER — Encounter (HOSPITAL_COMMUNITY): Payer: Self-pay | Admitting: Emergency Medicine

## 2023-08-13 DIAGNOSIS — S80911A Unspecified superficial injury of right knee, initial encounter: Secondary | ICD-10-CM

## 2023-08-13 DIAGNOSIS — W19XXXA Unspecified fall, initial encounter: Secondary | ICD-10-CM | POA: Diagnosis not present

## 2023-08-13 DIAGNOSIS — S8991XA Unspecified injury of right lower leg, initial encounter: Secondary | ICD-10-CM | POA: Diagnosis not present

## 2023-08-13 MED ORDER — CYCLOBENZAPRINE HCL 10 MG PO TABS: 10.0000 mg | ORAL_TABLET | Freq: Two times a day (BID) | ORAL | 0 refills | Status: DC | PRN

## 2023-08-13 NOTE — ED Triage Notes (Addendum)
Pt presents after falling on ice at work. She c/o right knee pain and left hip pain.  She is filing workers comp.  She had gastric bypass last summer so she cannot take medication like ibuprofen.

## 2023-08-13 NOTE — Discharge Instructions (Addendum)
The xray of your knee is normal - there is no acute injury!  You can take the muscle relaxer (Flexeril) twice daily. If the medication makes you drowsy, take only at bed time.  Please use tylenol as needed for pain control  Topical tiger balm can be used as well  Please follow up with your orthopedic specialist. The Employee Occupational Health & Wellness information is also provided.

## 2023-08-13 NOTE — ED Provider Notes (Signed)
[facility]    CSN: [id number] Arrival date & time: 08/13/23  1210     History   Chief Complaint Chief Complaint  Patient presents with   Knee Injury   Hip Pain    HPI Caroline Gonzales is a 47 y.o. female.  Slipped on ice this morning and fell Thinks she landed on the right knee No head injury or LOC She is having pain in right knee and left hip No weakness in extremities. Denies neck or back pain  History of right knee meniscus surgery    Had gastric bypass surgery in July. Cannot take any NSAIDs or steroids.  Fall occurred walking into work. She filed a safety zone. Her boss instructed her to be evaluated   Past Medical History:  Diagnosis Date   Acute costochondritis 11/14/2021   Allergy    Zyrtec.   Anxiety    followed by Dr. Toni Arthurs   Back pain    Complication of anesthesia    PONV   Depression    Fibroid    Gastroparesis 09/14/2012   gastric emptying study in 2014   GERD (gastroesophageal reflux disease)    Headache    Hypertriglyceridemia 10/07/2017   Nightmares 07/21/2018   Pneumonia    2013ish   PONV (postoperative nausea and vomiting)     likes scopolamine patch and zofran /phenergan helps   Pre-diabetes    PTSD (post-traumatic stress disorder)    Residual foreign body in soft tissue 11/12/2016   Screening for colon cancer 07/02/2022   Sleep apnea 07/01/2018   cpap optional, pt close to not use cpap   Vitamin D deficiency    Wears glasses     Patient Active Problem List   Diagnosis Date Noted   Family history of elevated lipoprotein (a) 04/26/2023   PVC's (premature ventricular contractions) 04/26/2023   Morbid obesity (HCC) 04/13/2023   Gastroesophageal reflux disease 12/24/2022   Abnormal laboratory test 12/24/2022   Chest wall pain 12/24/2022   Elevated blood pressure reading without diagnosis of hypertension 12/29/2021   Change in facial mole 12/29/2021   S/P laparoscopic hysterectomy 02/04/2021   PTSD (post-traumatic  stress disorder) 07/21/2018   GAD (generalized anxiety disorder) 07/21/2018   Insomnia 07/21/2018   Recurrent major depressive disorder, in partial remission (HCC) 07/21/2018   Insulin resistance 10/21/2017   Hypertriglyceridemia 10/07/2017   Vitamin D deficiency 10/07/2017   Pre-diabetes 11/02/2016   Morbid obesity with body mass index (BMI) of 50.0 to 59.9 in adult [facility]) 10/15/2016    Past Surgical History:  Procedure Laterality Date   ABDOMINAL HYSTERECTOMY  01/2021   ANKLE ARTHROSCOPY Left 2011   CESAREAN SECTION  06/2006   x 1   CHOLECYSTECTOMY  07/2006   laparoscopic   CYSTOSCOPY N/A 02/04/2021   Procedure: CYSTOSCOPY;  Surgeon: Romualdo Bolk, MD;  Location: [facility] OR;  Service: Gynecology;  Laterality: N/A;   DILATION AND CURETTAGE OF UTERUS  1997   x 2   FOREIGN BODY REMOVAL Left 11/18/2016   Procedure: REMOVAL FOREIGN BODY EXTREMITY LEFT FOOT;  Surgeon: Vivi Barrack, DPM;  Location: [facility] OR;  Service: Podiatry;  Laterality: Left;   GASTRIC ROUX-EN-Y N/A 04/13/2023   Procedure: LAPAROSCOPIC ROUX-EN-Y GASTRIC BYPASS WITH UPPER ENDOSCOPY;  Surgeon: Berna Bue, MD;  Location: [facility];  Service: General;  Laterality: N/A;   KNEE ARTHROSCOPY WITH MEDIAL MENISECTOMY Right 09/10/2021   Procedure: KNEE ARTHROSCOPY WITH MEDIAL MENISCAL ROOT REPAIR;  Surgeon: Bjorn Pippin, MD;  Location:  [facility];  Service: Orthopedics;  Laterality: Right;   PILONIDAL CYST EXCISION  1990's   RADIOLOGY WITH ANESTHESIA N/A 11/10/2018   Procedure: MRI WITH ANESTHESIA OF BRAIN AND ORBITS WITH AND WITHOUT CONTRAST;  Surgeon: Radiologist, Medication, MD;  Location: [facility] OR;  Service: Radiology;  Laterality: N/A;   TENDON REPAIR Left 2011   Left Ankle   TOTAL LAPAROSCOPIC HYSTERECTOMY WITH SALPINGECTOMY Bilateral 02/04/2021   Procedure: TOTAL LAPAROSCOPIC HYSTERECTOMY WITH SALPINGECTOMY;  Surgeon: Romualdo Bolk, MD;  Location: [facility] OR;  Service: Gynecology;  Laterality:  Bilateral;   UPPER GASTROINTESTINAL ENDOSCOPY     WISDOM TOOTH EXTRACTION  1990's    OB History     Gravida  3   Para  1   Term  1   Preterm  0   AB  2   Living  1      SAB  0   IAB  2   Ectopic  0   Multiple  0   Live Births  1            Home Medications    Prior to Admission medications   Medication Sig Start Date End Date Taking? Authorizing Provider  cyclobenzaprine (FLEXERIL) 10 MG tablet Take 1 tablet (10 mg total) by mouth 2 (two) times daily as needed for muscle spasms. 08/13/23  Yes Yun Gutierrez, Lurena Joiner, PA-C  Biotin 5 MG TABS Take by mouth.    [provider]  cetirizine (ZYRTEC) 10 MG tablet Take 10 mg by mouth at bedtime.    [provider]  Cholecalciferol (VITAMIN D3) 5000 units CAPS Take 5,000 Units by mouth at bedtime.    [provider]  clonazePAM (KLONOPIN) 0.5 MG tablet Take 1 tablet (0.5 mg) by mouth every 6 hours as needed for anxiety.  (up to 4 tablets daily) 03/24/23   Melony Overly T, PA-C  fluvoxaMINE (LUVOX) 100 MG tablet Take 1 tablet by mouth at bedtime. Take with 50 mg dose to equal 150 mg at bedtime 09/23/22   Melony Overly T, PA-C  fluvoxaMINE (LUVOX) 50 MG tablet TAKE 1 TABLET BY MOUTH AT BEDTIME. TAKE IN ADDITION TO THE 100 MG TABLET TO EQUAL 150 MG TOTAL 09/23/22   Hurst, Rosey Bath T, PA-C  gabapentin (NEURONTIN) 300 MG capsule Take 3 capsules (900 mg total) by mouth at bedtime. 07/14/23   Melony Overly T, PA-C  Multiple Vitamin (MULTIVITAMIN) capsule Take 1 capsule by mouth daily. bariatric    [provider]  ondansetron (ZOFRAN-ODT) 8 MG disintegrating tablet Dissolve 1 tablet by mouth every 8 hours as needed for nausea. 10/15/22   Tollie Eth, NP  pantoprazole (PROTONIX) 40 MG tablet Take 1 tablet (40 mg total) by mouth daily. 04/14/23   Berna Bue, MD  pantoprazole (PROTONIX) 40 MG tablet Take 1 tablet (40 mg total) by mouth once daily 07/09/23     prazosin (MINIPRESS) 5 MG capsule Take 2  capsules (10 mg total) by mouth at bedtime. 09/23/22   Cherie Ouch, PA-C  propranolol (INDERAL) 40 MG tablet Take 1 tablet by mouth twice a day --May take an extra tablet in evening if needed Patient taking differently: Take 40 mg by mouth See admin instructions. Take 40 mg (1 tablet) by nightly in addition to 40 mg (1 tablet) as needed during the day. 02/17/23   Tollie Eth, NP  QUEtiapine (SEROQUEL) 200 MG tablet Take 2 tablets (400 mg total) by mouth at bedtime. 06/25/23   Hurst,  Glade Nurse, PA-C  triamcinolone (NASACORT) 55 MCG/ACT AERO nasal inhaler Place 1 spray into the nose at bedtime.    [provider]  UNABLE TO FIND Take 1 tablet by mouth at bedtime. Magtein    [provider]    Family History Family History  Problem Relation Age of Onset   Squamous cell carcinoma Mother    Cancer Mother        squamous cell carcinoma   Hyperlipidemia Mother    Depression Mother    Anxiety disorder Mother    Obesity Mother    Heart disease Father 24       cardiomegaly, CHF; steroid use.   Hyperlipidemia Father    Hypertension Father    Mental retardation Father    High blood pressure Father    Depression Father    Anxiety disorder Father    Obesity Father    Rheum arthritis Sister    Multiple sclerosis Sister    Autoimmune disease Sister    Drug abuse Sister    Breast cancer Maternal Aunt    Thyroid cancer Paternal Aunt    Esophageal cancer Paternal Uncle    Squamous cell carcinoma Maternal Grandmother    Cancer Maternal Grandmother    Diabetes Maternal Grandmother    Heart disease Maternal Grandmother    Hyperlipidemia Maternal Grandmother    Hypertension Maternal Grandmother    Stroke Maternal Grandmother    Heart disease Paternal Grandmother    Hypertension Paternal Grandmother    Heart disease Paternal Grandfather    Hyperlipidemia Paternal Grandfather    Mental illness Paternal Grandfather    Colon cancer Neg Hx    Colon polyps Neg Hx    Rectal  cancer Neg Hx    Stomach cancer Neg Hx     Social History Social History   Tobacco Use   Smoking status: Former    Current packs/day: 0.00    Average packs/day: 2.0 packs/day for 10.0 years (20.0 ttl pk-yrs)    Types: Cigarettes    Start date: 09/14/1990    Quit date: 09/14/2000    Years since quitting: 22.9   Smokeless tobacco: Never  Vaping Use   Vaping status: Never Used  Substance Use Topics   Alcohol use: Not Currently   Drug use: No     Allergies   Bacitracin, Nsaids, Penicillins, Tamiflu [oseltamivir phosphate], Cephalosporins, and Latex   Review of Systems Review of Systems As per HPI  Physical Exam Triage Vital Signs ED Triage Vitals  Encounter Vitals Group     BP 08/13/23 1337 104/73     Systolic BP Percentile --      Diastolic BP Percentile --      Pulse Rate 08/13/23 1337 65     Resp 08/13/23 1337 16     Temp 08/13/23 1337 98 F (36.7 C)     Temp Source 08/13/23 1337 Oral     SpO2 08/13/23 1337 96 %     Weight --      Height --      Head Circumference --      Peak Flow --      Pain Score 08/13/23 1336 1     Pain Loc --      Pain Education --      Exclude from Growth Chart --    No data found.  Updated Vital Signs BP 104/73 (BP Location: Right Arm)   Pulse 65   Temp 98 F (36.7 C) (Oral)   Resp  16   LMP 01/06/2021   SpO2 96%    Physical Exam Vitals and nursing note reviewed.  Constitutional:      General: She is not in acute distress. HENT:     Head: Atraumatic.     Mouth/Throat:     Pharynx: Oropharynx is clear.  Eyes:     Conjunctiva/sclera: Conjunctivae normal.     Pupils: Pupils are equal, round, and reactive to light.  Cardiovascular:     Rate and Rhythm: Normal rate and regular rhythm.     Pulses: Normal pulses.     Heart sounds: Normal heart sounds.  Pulmonary:     Effort: Pulmonary effort is normal.     Breath sounds: Normal breath sounds.  Musculoskeletal:        General: Normal range of motion.     Cervical back:  Normal range of motion. No rigidity or tenderness.     Left hip: No tenderness or bony tenderness.     Right knee: No swelling or deformity. Tenderness present. Normal pulse.     Comments: Tender anterior knee. Good ROM. No obvious deformity or swelling, no abrasion or bruising. Ankle ROM intact. No bony tenderness at hip greater trochanter. Distal sensation intact. Strong DP pulse. Cap refill < 2 seconds. No low back pain with palpation, spine without bony tenderness    Skin:    General: Skin is warm and dry.     Capillary Refill: Capillary refill takes less than 2 seconds.  Neurological:     Mental Status: She is alert and oriented to person, place, and time.     Gait: Gait normal.     Comments: Strength intact lower extremities, sensation normal     UC Treatments / Results  Labs (all labs ordered are listed, but only abnormal results are displayed) Labs Reviewed - No data to display  EKG  Radiology DG Knee Complete 4 Views Right  Result Date: 08/13/2023 CLINICAL DATA:  Pain after fall EXAM: RIGHT KNEE - COMPLETE 4 VIEW COMPARISON:  None Available. FINDINGS: No fracture or dislocation. Preserved joint spaces and bone mineralization. No joint effusion on lateral view. Mild hyperostosis. IMPRESSION: No acute osseous abnormality. Electronically Signed   By: Karen Kays M.D.   On: 08/13/2023 15:17    Procedures Procedures  Medications Ordered in UC Medications - No data to display  Initial Impression / Assessment and Plan / UC Course  I have reviewed the triage vital signs and the nursing notes.  Pertinent labs & imaging results that were available during my care of the patient were reviewed by me and considered in my medical decision making (see chart for details).  Right knee xray without acute abnormality Discussed with patient likely soft tissue injury of knee and hip area. Symptomatic care with tylenol, flexeril, topical therapies. She does have an orthopedic specialist she  can follow with if knee pain persists. Provided information for occupational health & wellness. Patient agreeable to plan  Final Clinical Impressions(s) / UC Diagnoses   Final diagnoses:  Injury of right knee, initial encounter  Fall, initial encounter     Discharge Instructions      The xray of your knee is normal - there is no acute injury!  You can take the muscle relaxer (Flexeril) twice daily. If the medication makes you drowsy, take only at bed time.  Please use tylenol as needed for pain control  Topical tiger balm can be used as well  Please follow up with your orthopedic  specialist. The Employee Occupational Health & Wellness information is also provided.      ED Prescriptions     Medication Sig Dispense Auth. Provider   cyclobenzaprine (FLEXERIL) 10 MG tablet Take 1 tablet (10 mg total) by mouth 2 (two) times daily as needed for muscle spasms. 30 tablet Higinio Grow, Lurena Joiner, PA-C      PDMP not reviewed this encounter.   Marlow Baars, [place] 08/13/23 1539

## 2023-08-14 ENCOUNTER — Ambulatory Visit (HOSPITAL_COMMUNITY): Payer: Self-pay

## 2023-08-27 ENCOUNTER — Ambulatory Visit
Admission: RE | Admit: 2023-08-27 | Discharge: 2023-08-27 | Disposition: A | Discharge status: Home / Self Care | Payer: 59 | Source: Ambulatory Visit | Attending: Nurse Practitioner

## 2023-08-27 DIAGNOSIS — Z1231 Encounter for screening mammogram for malignant neoplasm of breast: Secondary | ICD-10-CM | POA: Diagnosis not present

## 2023-09-02 ENCOUNTER — Encounter (HOSPITAL_BASED_OUTPATIENT_CLINIC_OR_DEPARTMENT_OTHER): Payer: Self-pay

## 2023-09-15 ENCOUNTER — Other Ambulatory Visit: Payer: Self-pay | Admitting: Physician Assistant

## 2023-09-16 ENCOUNTER — Other Ambulatory Visit: Payer: Self-pay | Admitting: Physician Assistant

## 2023-09-16 ENCOUNTER — Other Ambulatory Visit: Payer: Self-pay

## 2023-09-16 ENCOUNTER — Other Ambulatory Visit (HOSPITAL_COMMUNITY): Payer: Self-pay

## 2023-09-16 MED ORDER — PRAZOSIN HCL 5 MG PO CAPS
10.0000 mg | ORAL_CAPSULE | Freq: Every day | ORAL | 3 refills | Status: DC
  Filled 2023-09-16: qty 180, 90d supply, fill #0
  Filled 2023-12-08: qty 180, 90d supply, fill #1
  Filled 2024-03-09: qty 180, 90d supply, fill #2
  Filled 2024-06-07: qty 180, 90d supply, fill #3

## 2023-09-16 NOTE — Therapy (Signed)
 OUTPATIENT PHYSICAL THERAPY LOWER EXTREMITY EVALUATION   Patient Name: Caroline Gonzales MRN: [id number] DOB:[birth year], 48 y.o., female Today's Date: 09/17/2023  END OF SESSION:  PT End of Session - 09/17/23 0823     Visit Number 1    Number of Visits 8    Date for PT Re-Evaluation 11/15/23    Authorization Type WC    Authorization - Visit Number 1    Authorization - Number of Visits 8    PT Start Time 0745    PT Stop Time 0830    PT Time Calculation (min) 45 min    Activity Tolerance Patient tolerated treatment well    Behavior During Therapy [facility] for tasks assessed/performed             Past Medical History:  Diagnosis Date   Acute costochondritis 11/14/2021   Allergy    Zyrtec .   Anxiety    followed by Dr. Melba   Back pain    Complication of anesthesia    PONV   Depression    Fibroid    Gastroparesis 09/14/2012   gastric emptying study in 2014   GERD (gastroesophageal reflux disease)    Headache    Hypertriglyceridemia 10/07/2017   Nightmares 07/21/2018   Pneumonia    2013ish   PONV (postoperative nausea and vomiting)     likes scopolamine  patch and zofran  /phenergan  helps   Pre-diabetes    PTSD (post-traumatic stress disorder)    Residual foreign body in soft tissue 11/12/2016   Screening for colon cancer 07/02/2022   Sleep apnea 07/01/2018   cpap optional, pt close to not use cpap   Vitamin D  deficiency    Wears glasses    Past Surgical History:  Procedure Laterality Date   ABDOMINAL HYSTERECTOMY  01/2021   ANKLE ARTHROSCOPY Left 2011   CESAREAN SECTION  06/2006   x 1   CHOLECYSTECTOMY  07/2006   laparoscopic   CYSTOSCOPY N/A 02/04/2021   Procedure: CYSTOSCOPY;  Surgeon: Jannis Kate Norris, MD;  Location: [facility] OR;  Service: Gynecology;  Laterality: N/A;   DILATION AND CURETTAGE OF UTERUS  1997   x 2   FOREIGN BODY REMOVAL Left 11/18/2016   Procedure: REMOVAL FOREIGN BODY EXTREMITY LEFT FOOT;  Surgeon: Donnice JONELLE Fees, DPM;  Location[facility]  OR;  Service: Podiatry;  Laterality: Left;   GASTRIC ROUX-EN-Y N/A 04/13/2023   Procedure: LAPAROSCOPIC ROUX-EN-Y GASTRIC BYPASS WITH UPPER ENDOSCOPY;  Surgeon: Signe Mitzie LABOR, MD;  Location: [facility];  Service: General;  Laterality: N/A;   KNEE ARTHROSCOPY WITH MEDIAL MENISECTOMY Right 09/10/2021   Procedure: KNEE ARTHROSCOPY WITH MEDIAL MENISCAL ROOT REPAIR;  Surgeon: Cristy Bonner DASEN, MD;  Location: [facility];  Service: Orthopedics;  Laterality: Right;   PILONIDAL CYST EXCISION  1990's   RADIOLOGY WITH ANESTHESIA N/A 11/10/2018   Procedure: MRI WITH ANESTHESIA OF BRAIN AND ORBITS WITH AND WITHOUT CONTRAST;  Surgeon: Radiologist, Medication, MD;  Location: [facility];  Service: Radiology;  Laterality: N/A;   TENDON REPAIR Left 2011   Left Ankle   TOTAL LAPAROSCOPIC HYSTERECTOMY WITH SALPINGECTOMY Bilateral 02/04/2021   Procedure: TOTAL LAPAROSCOPIC HYSTERECTOMY WITH SALPINGECTOMY;  Surgeon: Jannis Kate Norris, MD;  Location: [facility] OR;  Service: Gynecology;  Laterality: Bilateral;   UPPER GASTROINTESTINAL ENDOSCOPY     WISDOM TOOTH EXTRACTION  1990's   Patient Active Problem List   Diagnosis Date Noted   Family history of elevated lipoprotein (a) 04/26/2023   PVC's (premature ventricular contractions) 04/26/2023   Morbid  obesity (HCC) 04/13/2023   Gastroesophageal reflux disease 12/24/2022   Abnormal laboratory test 12/24/2022   Chest wall pain 12/24/2022   Elevated blood pressure reading without diagnosis of hypertension 12/29/2021   Change in facial mole 12/29/2021   S/P laparoscopic hysterectomy 02/04/2021   PTSD (post-traumatic stress disorder) 07/21/2018   GAD (generalized anxiety disorder) 07/21/2018   Insomnia 07/21/2018   Recurrent major depressive disorder, in partial remission (HCC) 07/21/2018   Insulin  resistance 10/21/2017   Hypertriglyceridemia 10/07/2017   Vitamin D  deficiency 10/07/2017   Pre-diabetes 11/02/2016   Morbid obesity with body mass index (BMI) of  50.0 to 59.9 in adult [facility]) 10/15/2016    PCP:  Oris Camie FORBES LOISE   REFERRING PROVIDER: Yvone Rush, MD   REFERRING DIAG: injury ; LEFT HIP +  RIGHT KNEE INJURY  THERAPY DIAG:  Muscle weakness (generalized)  Pain in joint of right knee  Rationale for Evaluation and Treatment: Rehabilitation  ONSET DATE: 08/13/23  SUBJECTIVE:   SUBJECTIVE STATEMENT: R knee pain since fall 1 month ago, symptoms localized to anterior knee/patellar tendon  PERTINENT HISTORY: Per referral:  PAIN:  Are you having pain? Yes: NPRS scale: 5/10 Pain location: R knee Pain description: ache Aggravating factors: squatting, kneeling, stairs(descending) Relieving factors: cessation of activity  PRECAUTIONS: None  RED FLAGS: None   WEIGHT BEARING RESTRICTIONS: No  FALLS:  Has patient fallen in last 6 months? Yes. Number of falls 1  OCCUPATION: research  PLOF: Independent  PATIENT GOALS: To manage my knee symptoms  NEXT MD VISIT: 10/03/22/5  OBJECTIVE:  Note: Objective measures were completed at Evaluation unless otherwise noted.  DIAGNOSTIC FINDINGS: See history  PATIENT SURVEYS:  FOTO 67(72 predicted)  EDEMA:  None observed  MUSCLE LENGTH: Hamstrings: Right 90 deg; Left 90 deg R ITB: Unremarkable  POSTURE: No Significant postural limitations  PALPATION: TTP R patellar tendon and underlying fat pad  LOWER EXTREMITY ROM: WNL  Active ROM Right eval Left eval  Hip flexion    Hip extension    Hip abduction    Hip adduction    Hip internal rotation    Hip external rotation    Knee flexion    Knee extension    Ankle dorsiflexion    Ankle plantarflexion    Ankle inversion    Ankle eversion     (Blank rows = not tested)  LOWER EXTREMITY MMT:  MMT Right eval Left eval  Hip flexion    Hip extension    Hip abduction    Hip adduction    Hip internal rotation    Hip external rotation    Knee flexion    Knee extension 5-   Ankle dorsiflexion    Ankle  plantarflexion    Ankle inversion    Ankle eversion     (Blank rows = not tested)  LOWER EXTREMITY SPECIAL TESTS:  Knee special tests: Anterior drawer test: negative, McMurray's test: negative, Patellafemoral apprehension test: negative, Lateral pull sign: negative, Patellafemoral grind test: negative, and Patella tap test (ballotable patella): negative  FUNCTIONAL TESTS:  30 seconds chair stand test TBD  GAIT: Distance walked: 48ftx2 Assistive device utilized: None Level of assistance: Complete Independence Comments: unremarkable  TREATMENT DATE:  [facility] Adult PT Treatment:                                                DATE: 09/17/23 Therapeutic Exercise: Demonstrated heel slides, eccentric FAQ's and QS's   Modalities: US  R patellar tendon/fat pad, 50%, 1 MHz 8 min    PATIENT EDUCATION:  Education details: Discussed eval findings, rehab rationale and POC and patient is in agreement  Person educated: Patient Education method: Explanation Education comprehension: verbalized understanding and needs further education  HOME EXERCISE PROGRAM: Access Code: [email or web address] URL: [email or web address] Date: 09/17/2023 Prepared by: Reyes Kohut  Exercises - Long Sitting Quad Set  - 1 x daily - 5 x weekly - 3 sets - 10 reps - Seated Long Arc Quad  - 1 x daily - 5 x weekly - 3 sets - 10 reps - Supine Heel Slide  - 1 x daily - 5 x weekly - 3 sets - 10 reps  ASSESSMENT:  CLINICAL IMPRESSION: Patient is a 48 y.o. female who was seen today for physical therapy evaluation and treatment for R knee pain. Patient presents with full AROM in R knee, mild extensor weakness and TTP across patellar tendon and surrounding fat pad.  Ligamentously stable throughout.  Patient is a good candidate for OPPT  OBJECTIVE IMPAIRMENTS: decreased activity tolerance,  decreased knowledge of condition, decreased strength, and pain.   ACTIVITY LIMITATIONS: carrying, bending, sitting, squatting, and stairs  PERSONAL FACTORS: Age and Fitness are also affecting patient's functional outcome.   REHAB POTENTIAL: Good  CLINICAL DECISION MAKING: Stable/uncomplicated  EVALUATION COMPLEXITY: Low   GOALS: Goals reviewed with patient? No  SHORT TERM GOALS=LONG TERM GOALS: Target date: 10/15/2023   Patient to demonstrate independence in HEP  Baseline: Access Code: [email or web address] Goal status: INITIAL  2.  Patient will acknowledge 2/10 pain at least once during episode of care   Baseline: 5/10 at worst Goal status: INITIAL  3.  Patient will score at least 72% on FOTO to signify clinically meaningful improvement in functional abilities.   Baseline: 67 Goal status: INITIAL  4.  Assess and establish 30s chair stand test goal Baseline: TBD Goal status: INITIAL  5.  Increase R knee extensor strength to 5/5 Baseline: 5-/5 Goal status: INITIAL   PLAN:  PT FREQUENCY: 2x/week  PT DURATION: 4 weeks  PLANNED INTERVENTIONS: 97164- PT Re-evaluation, 97110-Therapeutic exercises, 97530- Therapeutic activity, 97112- Neuromuscular re-education, 97535- Self Care, [id number]- Manual therapy, 97116- Gait training, Taping, Dry Needling, Joint mobilization, Cryotherapy, and Moist heat  PLAN FOR NEXT SESSION: HEP review and update, manual techniques as appropriate, aerobic tasks, ROM and flexibility activities, strengthening and PREs, TPDN, gait and balance training as needed     Reyes CHRISTELLA Kohut, PT 09/17/2023, 9:27 AM

## 2023-09-16 NOTE — Telephone Encounter (Signed)
 Caroline Gonzales,  What does this mean-  Prescription request comments from Glace, Gonzales G: P  - IVR/NO REF/CALL DR/2of2

## 2023-09-17 ENCOUNTER — Other Ambulatory Visit: Payer: Self-pay

## 2023-09-17 ENCOUNTER — Ambulatory Visit: Payer: PRIVATE HEALTH INSURANCE | Attending: Orthopedic Surgery

## 2023-09-17 DIAGNOSIS — M6281 Muscle weakness (generalized): Secondary | ICD-10-CM | POA: Insufficient documentation

## 2023-09-17 DIAGNOSIS — M25561 Pain in right knee: Secondary | ICD-10-CM | POA: Insufficient documentation

## 2023-09-21 DIAGNOSIS — F4310 Post-traumatic stress disorder, unspecified: Secondary | ICD-10-CM | POA: Diagnosis not present

## 2023-09-21 NOTE — Therapy (Signed)
 OUTPATIENT PHYSICAL THERAPY TREATMENT   Patient Name: NAKEYA ADINOLFI MRN: [id number] DOB:12-22-1975, 48 y.o., female Today's Date: 09/22/2023  END OF SESSION:  PT End of Session - 09/22/23 0841     Visit Number 2    Number of Visits 8    Date for PT Re-Evaluation 11/15/23    Authorization Type WC    Authorization - Visit Number 2    Authorization - Number of Visits 8    PT Start Time 0845    PT Stop Time 0925    PT Time Calculation (min) 40 min    Activity Tolerance Patient tolerated treatment well    Behavior During Therapy [facility] for tasks assessed/performed              Past Medical History:  Diagnosis Date   Acute costochondritis 11/14/2021   Allergy    Zyrtec .   Anxiety    followed by Dr. Melba   Back pain    Complication of anesthesia    PONV   Depression    Fibroid    Gastroparesis 09/14/2012   gastric emptying study in 2014   GERD (gastroesophageal reflux disease)    Headache    Hypertriglyceridemia 10/07/2017   Nightmares 07/21/2018   Pneumonia    2013ish   PONV (postoperative nausea and vomiting)     likes scopolamine  patch and zofran  /phenergan  helps   Pre-diabetes    PTSD (post-traumatic stress disorder)    Residual foreign body in soft tissue 11/12/2016   Screening for colon cancer 07/02/2022   Sleep apnea 07/01/2018   cpap optional, pt close to not use cpap   Vitamin D  deficiency    Wears glasses    Past Surgical History:  Procedure Laterality Date   ABDOMINAL HYSTERECTOMY  01/2021   ANKLE ARTHROSCOPY Left 2011   CESAREAN SECTION  06/2006   x 1   CHOLECYSTECTOMY  07/2006   laparoscopic   CYSTOSCOPY N/A 02/04/2021   Procedure: CYSTOSCOPY;  Surgeon: Jannis Kate Norris, MD;  Location: [facility] OR;  Service: Gynecology;  Laterality: N/A;   DILATION AND CURETTAGE OF UTERUS  1997   x 2   FOREIGN BODY REMOVAL Left 11/18/2016   Procedure: REMOVAL FOREIGN BODY EXTREMITY LEFT FOOT;  Surgeon: Donnice JONELLE Fees, DPM;  Location: [facility] OR;  Service:  Podiatry;  Laterality: Left;   GASTRIC ROUX-EN-Y N/A 04/13/2023   Procedure: LAPAROSCOPIC ROUX-EN-Y GASTRIC BYPASS WITH UPPER ENDOSCOPY;  Surgeon: Signe Mitzie LABOR, MD;  Location: [facility] ORS;  Service: General;  Laterality: N/A;   KNEE ARTHROSCOPY WITH MEDIAL MENISECTOMY Right 09/10/2021   Procedure: KNEE ARTHROSCOPY WITH MEDIAL MENISCAL ROOT REPAIR;  Surgeon: Cristy Bonner DASEN, MD;  Location: [facility];  Service: Orthopedics;  Laterality: Right;   PILONIDAL CYST EXCISION  1990's   RADIOLOGY WITH ANESTHESIA N/A 11/10/2018   Procedure: MRI WITH ANESTHESIA OF BRAIN AND ORBITS WITH AND WITHOUT CONTRAST;  Surgeon: Radiologist, Medication, MD;  Location: [facility] OR;  Service: Radiology;  Laterality: N/A;   TENDON REPAIR Left 2011   Left Ankle   TOTAL LAPAROSCOPIC HYSTERECTOMY WITH SALPINGECTOMY Bilateral 02/04/2021   Procedure: TOTAL LAPAROSCOPIC HYSTERECTOMY WITH SALPINGECTOMY;  Surgeon: Jannis Kate Norris, MD;  Location: [facility] OR;  Service: Gynecology;  Laterality: Bilateral;   UPPER GASTROINTESTINAL ENDOSCOPY     WISDOM TOOTH EXTRACTION  1990's   Patient Active Problem List   Diagnosis Date Noted   Family history of elevated lipoprotein (a) 04/26/2023   PVC's (premature ventricular contractions) 04/26/2023   Morbid obesity (  HCC) 04/13/2023   Gastroesophageal reflux disease 12/24/2022   Abnormal laboratory test 12/24/2022   Chest wall pain 12/24/2022   Elevated blood pressure reading without diagnosis of hypertension 12/29/2021   Change in facial mole 12/29/2021   S/P laparoscopic hysterectomy 02/04/2021   PTSD (post-traumatic stress disorder) 07/21/2018   GAD (generalized anxiety disorder) 07/21/2018   Insomnia 07/21/2018   Recurrent major depressive disorder, in partial remission (HCC) 07/21/2018   Insulin  resistance 10/21/2017   Hypertriglyceridemia 10/07/2017   Vitamin D  deficiency 10/07/2017   Pre-diabetes 11/02/2016   Morbid obesity with body mass index (BMI) of 50.0 to 59.9 in  adult (HCC) 10/15/2016    PCP:  Oris Camie FORBES LOISE   REFERRING PROVIDER: Yvone Rush, MD   REFERRING DIAG: injury ; LEFT HIP +  RIGHT KNEE INJURY  THERAPY DIAG:  Muscle weakness (generalized)  Pain in joint of right knee  Stiffness of right knee, not elsewhere classified  Difficulty walking  Rationale for Evaluation and Treatment: Rehabilitation  ONSET DATE: 08/13/23  SUBJECTIVE:   SUBJECTIVE STATEMENT: Pt presents to PT with no current reports of pain in R knee. Has been compliant with HEP with no adverse effect.   PERTINENT HISTORY: Per referral:  PAIN:  Are you having pain?  Yes: NPRS scale: 5/10 Pain location: R knee Pain description: ache Aggravating factors: squatting, kneeling, stairs(descending) Relieving factors: cessation of activity  PRECAUTIONS: None  RED FLAGS: None   WEIGHT BEARING RESTRICTIONS: No  FALLS:  Has patient fallen in last 6 months? Yes. Number of falls 1  OCCUPATION: research  PLOF: Independent  PATIENT GOALS: To manage my knee symptoms  NEXT MD VISIT: 10/03/22/5  OBJECTIVE:  Note: Objective measures were completed at Evaluation unless otherwise noted.  DIAGNOSTIC FINDINGS: See history  PATIENT SURVEYS:  FOTO 67(72 predicted)  EDEMA:  None observed  MUSCLE LENGTH: Hamstrings: Right 90 deg; Left 90 deg R ITB: Unremarkable  POSTURE: No Significant postural limitations  PALPATION: TTP R patellar tendon and underlying fat pad  LOWER EXTREMITY ROM: WNL  Active ROM Right eval Left eval  Hip flexion    Hip extension    Hip abduction    Hip adduction    Hip internal rotation    Hip external rotation    Knee flexion    Knee extension    Ankle dorsiflexion    Ankle plantarflexion    Ankle inversion    Ankle eversion     (Blank rows = not tested)  LOWER EXTREMITY MMT:  MMT Right eval Left eval  Hip flexion    Hip extension    Hip abduction    Hip adduction    Hip internal rotation    Hip external  rotation    Knee flexion    Knee extension 5-   Ankle dorsiflexion    Ankle plantarflexion    Ankle inversion    Ankle eversion     (Blank rows = not tested)  LOWER EXTREMITY SPECIAL TESTS:  Knee special tests: Anterior drawer test: negative, McMurray's test: negative, Patellafemoral apprehension test: negative, Lateral pull sign: negative, Patellafemoral grind test: negative, and Patella tap test (ballotable patella): negative  FUNCTIONAL TESTS:  30 seconds chair stand test TBD  GAIT: Distance walked: 53ftx2 Assistive device utilized: None Level of assistance: Complete Independence Comments: unremarkable  TREATMENT DATE:  [facility] Adult PT Treatment:                                                DATE: 09/22/23 Therapeutic Exercise: NuStep lvl  Supine QS x 10 - 5 hold Supine SLR 2x10 R Bridge 2x10 S/L clamshell 2x15 RTB LAQ 2x10 4# R McConnell tape with lateral glide to R knee Trigger Point Dry Needling:  Initial Treatment: Pt instructed on Dry Needling rational, procedures, and possible side effects. Pt instructed to expect mild to moderate muscle soreness later in the day and/or into the next day.  Pt instructed to continue prescribed HEP.  Patient Verbal Consent Given: Yes Education Handout Provided: No   Muscles Treated: R quad (lateral) Electrical Stimulation Performed: No Treatment Response/Outcome: twitch response     [facility] Adult PT Treatment:                                                DATE: 09/17/23 Therapeutic Exercise: Demonstrated heel slides, eccentric FAQ's and QS's  Modalities: US  R patellar tendon/fat pad, 50%, 1 MHz 8 min  PATIENT EDUCATION:  Education details: Discussed eval findings, rehab rationale and POC and patient is in agreement  Person educated: Patient Education method: Explanation Education comprehension:  verbalized understanding and needs further education  HOME EXERCISE PROGRAM: Access Code: [email or web address] URL: [email or web address] Date: 09/22/2023 Prepared by: Alm Kingdom  Exercises - Long Sitting Quad Set  - 1 x daily - 5 x weekly - 3 sets - 10 reps - Seated Long Arc Quad  - 1 x daily - 5 x weekly - 3 sets - 10 reps - Supine Heel Slide  - 1 x daily - 5 x weekly - 3 sets - 10 reps - Clamshell with Resistance  - 1 x daily - 7 x weekly - 2 sets - 15 reps - red band hold  ASSESSMENT:  CLINICAL IMPRESSION: Pt was able to complete all prescribed exercises and responded well to TPDN with no adverse effect. Therapy today also focused on quad and proximal hip strengthening for decreasing pain and improving comfort. Pt is progressing well, will continue per POC.   EVAL: Patient is a 48 y.o. female who was seen today for physical therapy evaluation and treatment for R knee pain. Patient presents with full AROM in R knee, mild extensor weakness and TTP across patellar tendon and surrounding fat pad.  Ligamentously stable throughout.  Patient is a good candidate for OPPT   OBJECTIVE IMPAIRMENTS: decreased activity tolerance, decreased knowledge of condition, decreased strength, and pain.   ACTIVITY LIMITATIONS: carrying, bending, sitting, squatting, and stairs  PERSONAL FACTORS: Age and Fitness are also affecting patient's functional outcome.   REHAB POTENTIAL: Good  CLINICAL DECISION MAKING: Stable/uncomplicated  EVALUATION COMPLEXITY: Low   GOALS: Goals reviewed with patient? No  SHORT TERM GOALS=LONG TERM GOALS: Target date: 10/15/2023   Patient to demonstrate independence in HEP  Baseline: Access Code: [email or web address] Goal status: INITIAL  2.  Patient will acknowledge 2/10 pain at least once during episode of care   Baseline: 5/10 at worst Goal status: INITIAL  3.  Patient will score at least 72% on FOTO to signify clinically meaningful improvement in functional abilities.  Baseline: 67 Goal status: INITIAL  4.  Assess and establish 30s chair stand test goal Baseline: TBD Goal status: INITIAL  5.  Increase R knee extensor strength to 5/5 Baseline: 5-/5 Goal status: INITIAL   PLAN:  PT FREQUENCY: 2x/week  PT DURATION: 4 weeks  PLANNED INTERVENTIONS: 97164- PT Re-evaluation, 97110-Therapeutic exercises, 97530- Therapeutic activity, 97112- Neuromuscular re-education, 97535- Self Care, [id number]- Manual therapy, 97116- Gait training, Taping, Dry Needling, Joint mobilization, Cryotherapy, and Moist heat  PLAN FOR NEXT SESSION: HEP review and update, manual techniques as appropriate, aerobic tasks, ROM and flexibility activities, strengthening and PREs, TPDN, gait and balance training as needed     Alm JAYSON Kingdom, PT 09/22/2023, 12:09 PM

## 2023-09-22 ENCOUNTER — Ambulatory Visit: Payer: Self-pay | Attending: Nurse Practitioner

## 2023-09-22 DIAGNOSIS — R262 Difficulty in walking, not elsewhere classified: Secondary | ICD-10-CM | POA: Insufficient documentation

## 2023-09-22 DIAGNOSIS — M6281 Muscle weakness (generalized): Secondary | ICD-10-CM | POA: Diagnosis present

## 2023-09-22 DIAGNOSIS — M25661 Stiffness of right knee, not elsewhere classified: Secondary | ICD-10-CM | POA: Diagnosis present

## 2023-09-22 DIAGNOSIS — M25561 Pain in right knee: Secondary | ICD-10-CM | POA: Insufficient documentation

## 2023-09-24 ENCOUNTER — Other Ambulatory Visit (HOSPITAL_COMMUNITY): Payer: Self-pay

## 2023-09-24 ENCOUNTER — Ambulatory Visit (INDEPENDENT_AMBULATORY_CARE_PROVIDER_SITE_OTHER): Payer: Commercial Managed Care - PPO | Admitting: Physician Assistant

## 2023-09-24 ENCOUNTER — Encounter: Payer: Self-pay | Admitting: Physician Assistant

## 2023-09-24 DIAGNOSIS — F4310 Post-traumatic stress disorder, unspecified: Secondary | ICD-10-CM

## 2023-09-24 DIAGNOSIS — F411 Generalized anxiety disorder: Secondary | ICD-10-CM | POA: Diagnosis not present

## 2023-09-24 DIAGNOSIS — F515 Nightmare disorder: Secondary | ICD-10-CM | POA: Diagnosis not present

## 2023-09-24 DIAGNOSIS — G4700 Insomnia, unspecified: Secondary | ICD-10-CM

## 2023-09-24 DIAGNOSIS — F3342 Major depressive disorder, recurrent, in full remission: Secondary | ICD-10-CM

## 2023-09-24 MED ORDER — CLONAZEPAM 0.5 MG PO TABS
0.5000 mg | ORAL_TABLET | Freq: Four times a day (QID) | ORAL | 5 refills | Status: DC | PRN
  Filled 2023-09-24 – 2023-11-02 (×2): qty 120, 30d supply, fill #0
  Filled 2023-12-08: qty 120, 30d supply, fill #1
  Filled 2024-01-16: qty 120, 30d supply, fill #2
  Filled 2024-02-16: qty 120, 30d supply, fill #3
  Filled 2024-03-15: qty 120, 30d supply, fill #4

## 2023-09-24 MED ORDER — FLUVOXAMINE MALEATE 100 MG PO TABS
100.0000 mg | ORAL_TABLET | Freq: Every day | ORAL | 3 refills | Status: DC
  Filled 2023-09-24 – 2023-11-04 (×2): qty 90, 90d supply, fill #0
  Filled 2024-01-30: qty 90, 90d supply, fill #1
  Filled 2024-05-03: qty 90, 90d supply, fill #2
  Filled 2024-07-31: qty 90, 90d supply, fill #3

## 2023-09-24 MED ORDER — FLUVOXAMINE MALEATE 50 MG PO TABS
50.0000 mg | ORAL_TABLET | Freq: Every day | ORAL | 3 refills | Status: DC
  Filled 2023-09-24 – 2023-11-10 (×2): qty 90, 90d supply, fill #0
  Filled 2024-02-08: qty 90, 90d supply, fill #1
  Filled 2024-05-03: qty 90, 90d supply, fill #2
  Filled 2024-08-14: qty 90, 90d supply, fill #3

## 2023-09-24 NOTE — Progress Notes (Signed)
 [facility]  Patient ID: Caroline Gonzales,  MRN: [id number]  PCP: Caroline Camie BRAVO, NP  Date of Evaluation: 09/24/2023 Time spent:20 minutes  Chief Complaint:  Chief Complaint   Anxiety; Depression; Insomnia; Follow-up    HISTORY/CURRENT STATUS: HPI for routine follow-up.  Caroline Gonzales is doing well.  Her current meds are effective.  She is sleeping well and night terrors are controlled.  She does get anxious at times still but usually triggered.  Not having panic attacks so much now as generalized anxiety.  The Klonopin  is effective.  Patient is able to enjoy things.  Energy and motivation are good.  Work is going well.   No extreme sadness, tearfulness, or feelings of hopelessness.  ADLs and personal hygiene are normal.   Denies any changes in concentration, making decisions, or remembering things.  She is still gradually losing weight after the bariatric surgery last year.  Denies suicidal or homicidal thoughts.  Patient denies increased energy with decreased need for sleep, increased talkativeness, racing thoughts, impulsivity or risky behaviors, increased spending, increased libido, grandiosity, increased irritability or anger, paranoia, or hallucinations.  Denies dizziness, syncope, seizures, numbness, tingling, tremor, tics, unsteady gait, slurred speech, confusion. Denies muscle or joint pain, stiffness, or dystonia. Denies unexplained weight loss, frequent infections, or sores that heal slowly.  No polyphagia, polydipsia, or polyuria. Denies visual changes or paresthesias.   Individual Medical History/ Review of Systems: Changes? :No     Past medications for mental health diagnoses include: Prozac, Paxil, Effexor, Norpramin, Wellbutrin, doxepin, Elavil, Geodon, Abilify, Lexapro, Trileptal, Ambien, Lunesta, Sonata, trazodone, Restoril, Remeron, Seroquel , Klonopin , propranolol , Lamictal , Deplin, Latuda, Xanax  made her feel horrible, Ativan  didn't help, prazosin    Allergies:  Bacitracin, Nsaids, Penicillins, Tamiflu [oseltamivir phosphate], Cephalosporins, and Latex  Current Medications:  Current Outpatient Medications:    Biotin 5 MG TABS, Take by mouth., Disp: , Rfl:    Cholecalciferol (VITAMIN D3) 5000 units CAPS, Take 5,000 Units by mouth at bedtime., Disp: , Rfl:    gabapentin  (NEURONTIN ) 300 MG capsule, Take 3 capsules (900 mg total) by mouth at bedtime., Disp: 270 capsule, Rfl: 1   Multiple Vitamin (MULTIVITAMIN) capsule, Take 1 capsule by mouth daily. bariatric, Disp: , Rfl:    ondansetron  (ZOFRAN -ODT) 8 MG disintegrating tablet, Dissolve 1 tablet by mouth every 8 hours as needed for nausea., Disp: 30 tablet, Rfl: 11   pantoprazole  (PROTONIX ) 40 MG tablet, Take 1 tablet (40 mg total) by mouth daily., Disp: 90 tablet, Rfl: 0   pantoprazole  (PROTONIX ) 40 MG tablet, Take 1 tablet (40 mg total) by mouth once daily, Disp: 90 tablet, Rfl: 0   prazosin  (MINIPRESS ) 5 MG capsule, Take 2 capsules (10 mg total) by mouth at bedtime., Disp: 180 capsule, Rfl: 3   propranolol  (INDERAL ) 40 MG tablet, Take 1 tablet by mouth twice a day --May take an extra tablet in evening if needed (Patient taking differently: Take 40 mg by mouth See admin instructions. Take 40 mg (1 tablet) by nightly in addition to 40 mg (1 tablet) as needed during the day.), Disp: 270 tablet, Rfl: 3   QUEtiapine  (SEROQUEL ) 200 MG tablet, Take 2 tablets (400 mg total) by mouth at bedtime., Disp: 180 tablet, Rfl: 1   triamcinolone  (NASACORT ) 55 MCG/ACT AERO nasal inhaler, Place 1 spray into the nose at bedtime., Disp: , Rfl:    UNABLE TO FIND, Take 1 tablet by mouth at bedtime. Magtein, Disp: , Rfl:    cetirizine  (ZYRTEC ) 10 MG tablet, Take 10 mg by  mouth at bedtime. (Patient not taking: Reported on 09/24/2023), Disp: , Rfl:    clonazePAM  (KLONOPIN ) 0.5 MG tablet, Take 1 tablet (0.5 mg) by mouth every 6 hours as needed for anxiety.  (up to 4 tablets daily), Disp: 120 tablet, Rfl: 5   fluvoxaMINE  (LUVOX ) 100 MG  tablet, Take 1 tablet by mouth at bedtime. Take with 50 mg dose to equal 150 mg at bedtime, Disp: 90 tablet, Rfl: 3   fluvoxaMINE  (LUVOX ) 50 MG tablet, TAKE 1 TABLET BY MOUTH AT BEDTIME. TAKE IN ADDITION TO THE 100 MG TABLET TO EQUAL 150 MG TOTAL, Disp: 90 tablet, Rfl: 3 Medication Side Effects: none  Family Medical/ Social History: Changes?   Son has a boyfriend now.   MENTAL HEALTH EXAM:  Last menstrual period 01/06/2021.There is no height or weight on file to calculate BMI.  General Appearance: Casual and Well Groomed  Eye Contact:  Good  Speech:  Clear and Coherent and Normal Rate  Volume:  Normal  Mood:  Euthymic  Affect:  Congruent  Thought Process:  Goal Directed and Descriptions of Associations: Circumstantial  Orientation:  Full (Time, Place, and Person)  Thought Content: Logical   Suicidal Thoughts:  No  Homicidal Thoughts:  No  Memory:  WNL  Judgement:  Good  Insight:  Good  Psychomotor Activity:  Normal  Concentration:  Concentration: Good and Attention Span: Good  Recall:  Good  Fund of Knowledge: Good  Language: Good  Assets:  Communication Skills Desire for Improvement Financial Resources/Insurance Housing Resilience Transportation Vocational/Educational  ADL's:  Intact  Cognition: WNL  Prognosis:  Good   PCP follows labs  DIAGNOSES:    ICD-10-CM   1. Recurrent major depression in full remission (HCC)  F33.42     2. PTSD (post-traumatic stress disorder)  F43.10     3. Generalized anxiety disorder  F41.1     4. Nightmares  F51.5     5. Insomnia, unspecified type  G47.00       Receiving Psychotherapy: Yes  Heather Mask  RECOMMENDATIONS:  PDMP was reviewed.  Last Klonopin  filled 09/16/2023. I provided 20 minutes of face to face time during this encounter, including time spent before and after the visit in records review, medical decision making, counseling pertinent to today's visit, and charting.   I am glad to see her doing so well.  No changes  are necessary.  Continue Klonopin  0.5 mg, 1 p.o. 4 times daily as needed. (She takes 3 at bedtime routinely, and 1 every day prn.  Continue Luvox  150 mg nightly. Continue Gabapentin  300 mg, 3 at bedtime. Continue prazosin  5 mg, 2 po qhs.  Continue propranolol  40 mg, 1 p.o. bid.  Continue Seroquel  200 mg, 2 at bedtime.  Continue counseling with Heather Mask. Return in 6 months.  Verneita Cooks, PA-C

## 2023-09-27 ENCOUNTER — Ambulatory Visit: Payer: PRIVATE HEALTH INSURANCE | Attending: Orthopedic Surgery

## 2023-09-27 DIAGNOSIS — M6281 Muscle weakness (generalized): Secondary | ICD-10-CM | POA: Insufficient documentation

## 2023-09-27 DIAGNOSIS — M25661 Stiffness of right knee, not elsewhere classified: Secondary | ICD-10-CM | POA: Diagnosis present

## 2023-09-27 DIAGNOSIS — M25561 Pain in right knee: Secondary | ICD-10-CM | POA: Insufficient documentation

## 2023-09-27 NOTE — Therapy (Signed)
 OUTPATIENT PHYSICAL THERAPY TREATMENT   Patient Name: Caroline Gonzales MRN: [id number] DOB:October 09, 1975, 48 y.o., female Today's Date: 09/27/2023  END OF SESSION:  PT End of Session - 09/27/23 1434     Visit Number 3    Number of Visits 8    Date for PT Re-Evaluation 11/15/23    Authorization Type WC    Authorization - Visit Number 3    Authorization - Number of Visits 8    PT Start Time 1445    PT Stop Time 1525    PT Time Calculation (min) 40 min    Activity Tolerance Patient tolerated treatment well    Behavior During Therapy [facility] for tasks assessed/performed               Past Medical History:  Diagnosis Date   Acute costochondritis 11/14/2021   Allergy    Zyrtec .   Anxiety    followed by Dr. Melba   Back pain    Complication of anesthesia    PONV   Depression    Fibroid    Gastroparesis 09/14/2012   gastric emptying study in 2014   GERD (gastroesophageal reflux disease)    Headache    Hypertriglyceridemia 10/07/2017   Nightmares 07/21/2018   Pneumonia    2013ish   PONV (postoperative nausea and vomiting)     likes scopolamine  patch and zofran  /phenergan  helps   Pre-diabetes    PTSD (post-traumatic stress disorder)    Residual foreign body in soft tissue 11/12/2016   Screening for colon cancer 07/02/2022   Sleep apnea 07/01/2018   cpap optional, pt close to not use cpap   Vitamin D  deficiency    Wears glasses    Past Surgical History:  Procedure Laterality Date   ABDOMINAL HYSTERECTOMY  01/2021   ANKLE ARTHROSCOPY Left 2011   CESAREAN SECTION  06/2006   x 1   CHOLECYSTECTOMY  07/2006   laparoscopic   CYSTOSCOPY N/A 02/04/2021   Procedure: CYSTOSCOPY;  Surgeon: Jannis Kate Norris, MD;  Location: [facility] OR;  Service: Gynecology;  Laterality: N/A;   DILATION AND CURETTAGE OF UTERUS  1997   x 2   FOREIGN BODY REMOVAL Left 11/18/2016   Procedure: REMOVAL FOREIGN BODY EXTREMITY LEFT FOOT;  Surgeon: Donnice JONELLE Fees, DPM;  Location: [facility] OR;   Service: Podiatry;  Laterality: Left;   GASTRIC ROUX-EN-Y N/A 04/13/2023   Procedure: LAPAROSCOPIC ROUX-EN-Y GASTRIC BYPASS WITH UPPER ENDOSCOPY;  Surgeon: Signe Mitzie LABOR, MD;  Location: [facility] ORS;  Service: General;  Laterality: N/A;   KNEE ARTHROSCOPY WITH MEDIAL MENISECTOMY Right 09/10/2021   Procedure: KNEE ARTHROSCOPY WITH MEDIAL MENISCAL ROOT REPAIR;  Surgeon: Cristy Bonner DASEN, MD;  Location: [facility];  Service: Orthopedics;  Laterality: Right;   PILONIDAL CYST EXCISION  1990's   RADIOLOGY WITH ANESTHESIA N/A 11/10/2018   Procedure: MRI WITH ANESTHESIA OF BRAIN AND ORBITS WITH AND WITHOUT CONTRAST;  Surgeon: Radiologist, Medication, MD;  Location: [facility] OR;  Service: Radiology;  Laterality: N/A;   TENDON REPAIR Left 2011   Left Ankle   TOTAL LAPAROSCOPIC HYSTERECTOMY WITH SALPINGECTOMY Bilateral 02/04/2021   Procedure: TOTAL LAPAROSCOPIC HYSTERECTOMY WITH SALPINGECTOMY;  Surgeon: Jannis Kate Norris, MD;  Location: [facility] OR;  Service: Gynecology;  Laterality: Bilateral;   UPPER GASTROINTESTINAL ENDOSCOPY     WISDOM TOOTH EXTRACTION  1990's   Patient Active Problem List   Diagnosis Date Noted   Family history of elevated lipoprotein (a) 04/26/2023   PVC's (premature ventricular contractions) 04/26/2023   Morbid  obesity (HCC) 04/13/2023   Gastroesophageal reflux disease 12/24/2022   Abnormal laboratory test 12/24/2022   Chest wall pain 12/24/2022   Elevated blood pressure reading without diagnosis of hypertension 12/29/2021   Change in facial mole 12/29/2021   S/P laparoscopic hysterectomy 02/04/2021   PTSD (post-traumatic stress disorder) 07/21/2018   GAD (generalized anxiety disorder) 07/21/2018   Insomnia 07/21/2018   Recurrent major depressive disorder, in partial remission (HCC) 07/21/2018   Insulin  resistance 10/21/2017   Hypertriglyceridemia 10/07/2017   Vitamin D  deficiency 10/07/2017   Pre-diabetes 11/02/2016   Morbid obesity with body mass index (BMI) of 50.0  to 59.9 in adult (HCC) 10/15/2016    PCP:  Oris Camie FORBES LOISE   REFERRING PROVIDER: Yvone Rush, MD   REFERRING DIAG: injury ; LEFT HIP +  RIGHT KNEE INJURY  THERAPY DIAG:  Muscle weakness (generalized)  Pain in joint of right knee  Rationale for Evaluation and Treatment: Rehabilitation  ONSET DATE: 08/13/23  SUBJECTIVE:   SUBJECTIVE STATEMENT: Pt presents to PT with no current reports of pain or discomfort. She notes that she has been doing well and doesn't feel like she needs TPDN today.   PERTINENT HISTORY: Per referral:  PAIN:  Are you having pain?  Yes: NPRS scale: 5/10 Pain location: R knee Pain description: ache Aggravating factors: squatting, kneeling, stairs(descending) Relieving factors: cessation of activity  PRECAUTIONS: None  RED FLAGS: None   WEIGHT BEARING RESTRICTIONS: No  FALLS:  Has patient fallen in last 6 months? Yes. Number of falls 1  OCCUPATION: research  PLOF: Independent  PATIENT GOALS: To manage my knee symptoms  NEXT MD VISIT: 10/03/22/5  OBJECTIVE:  Note: Objective measures were completed at Evaluation unless otherwise noted.  DIAGNOSTIC FINDINGS: See history  PATIENT SURVEYS:  FOTO 67(72 predicted)  EDEMA:  None observed  MUSCLE LENGTH: Hamstrings: Right 90 deg; Left 90 deg R ITB: Unremarkable  POSTURE: No Significant postural limitations  PALPATION: TTP R patellar tendon and underlying fat pad  LOWER EXTREMITY ROM: WNL  Active ROM Right eval Left eval  Hip flexion    Hip extension    Hip abduction    Hip adduction    Hip internal rotation    Hip external rotation    Knee flexion    Knee extension    Ankle dorsiflexion    Ankle plantarflexion    Ankle inversion    Ankle eversion     (Blank rows = not tested)  LOWER EXTREMITY MMT:  MMT Right eval Left eval  Hip flexion    Hip extension    Hip abduction    Hip adduction    Hip internal rotation    Hip external rotation    Knee flexion     Knee extension 5-   Ankle dorsiflexion    Ankle plantarflexion    Ankle inversion    Ankle eversion     (Blank rows = not tested)  LOWER EXTREMITY SPECIAL TESTS:  Knee special tests: Anterior drawer test: negative, McMurray's test: negative, Patellafemoral apprehension test: negative, Lateral pull sign: negative, Patellafemoral grind test: negative, and Patella tap test (ballotable patella): negative  FUNCTIONAL TESTS:  30 seconds chair stand test TBD  GAIT: Distance walked: 26ftx2 Assistive device utilized: None Level of assistance: Complete Independence Comments: unremarkable  TREATMENT DATE:  [facility] Adult PT Treatment:                                                DATE: 09/27/23 Therapeutic Exercise: NuStep lvl 5 UE/LE x 4 min while taking subjective Supine QS x 10 - 5 hold Supine SLR 2x10 2# Modified thomas stretch x 60 R SAQ 2x15 each Bridge 2x10 with RTB S/L clamshell 2x15 RTB LAQ 2x10 2# Lateral walk YTB x 3 laps  Mini squat 2x10 - B UE  [facility] Adult PT Treatment:                                                DATE: 09/22/23 Therapeutic Exercise: NuStep lvl  Supine QS x 10 - 5 hold Supine SLR 2x10 R Bridge 2x10 S/L clamshell 2x15 RTB LAQ 2x10 4# R McConnell tape with lateral glide to R knee Trigger Point Dry Needling:  Initial Treatment: Pt instructed on Dry Needling rational, procedures, and possible side effects. Pt instructed to expect mild to moderate muscle soreness later in the day and/or into the next day.  Pt instructed to continue prescribed HEP.  Patient Verbal Consent Given: Yes Education Handout Provided: No   Muscles Treated: R quad (lateral) Electrical Stimulation Performed: No Treatment Response/Outcome: twitch response     [facility] Adult PT Treatment:                                                DATE:  09/17/23 Therapeutic Exercise: Demonstrated heel slides, eccentric FAQ's and QS's  Modalities: US  R patellar tendon/fat pad, 50%, 1 MHz 8 min  PATIENT EDUCATION:  Education details: Discussed eval findings, rehab rationale and POC and patient is in agreement  Person educated: Patient Education method: Explanation Education comprehension: verbalized understanding and needs further education  HOME EXERCISE PROGRAM: Access Code: [email or web address] URL: [email or web address] Date: 09/22/2023 Prepared by: Alm Kingdom  Exercises - Long Sitting Quad Set  - 1 x daily - 5 x weekly - 3 sets - 10 reps - Seated Long Arc Quad  - 1 x daily - 5 x weekly - 3 sets - 10 reps - Supine Heel Slide  - 1 x daily - 5 x weekly - 3 sets - 10 reps - Clamshell with Resistance  - 1 x daily - 7 x weekly - 2 sets - 15 reps - red band hold  ASSESSMENT:  CLINICAL IMPRESSION: Pt was able to complete all prescribed exercises with no adverse effect. Therapy today progressed quad loading and general LE strengthening. Did note some soreness in distal R knee post session. Pt continues to benefit from skilled services, will continue per POC.   EVAL: Patient is a 48 y.o. female who was seen today for physical therapy evaluation and treatment for R knee pain. Patient presents with full AROM in R knee, mild extensor weakness and TTP across patellar tendon and surrounding fat pad.  Ligamentously stable throughout.  Patient is a good candidate for OPPT   OBJECTIVE IMPAIRMENTS: decreased activity tolerance, decreased knowledge of condition, decreased strength, and pain.   ACTIVITY LIMITATIONS:  carrying, bending, sitting, squatting, and stairs  PERSONAL FACTORS: Age and Fitness are also affecting patient's functional outcome.   REHAB POTENTIAL: Good  CLINICAL DECISION MAKING: Stable/uncomplicated  EVALUATION COMPLEXITY: Low   GOALS: Goals reviewed with patient? No  SHORT TERM GOALS=LONG TERM GOALS: Target date:  10/15/2023   Patient to demonstrate independence in HEP  Baseline: Access Code: [email or web address] Goal status: INITIAL  2.  Patient will acknowledge 2/10 pain at least once during episode of care   Baseline: 5/10 at worst Goal status: INITIAL  3.  Patient will score at least 72% on FOTO to signify clinically meaningful improvement in functional abilities.   Baseline: 67 Goal status: INITIAL  4.  Assess and establish 30s chair stand test goal Baseline: TBD Goal status: INITIAL  5.  Increase R knee extensor strength to 5/5 Baseline: 5-/5 Goal status: INITIAL   PLAN:  PT FREQUENCY: 2x/week  PT DURATION: 4 weeks  PLANNED INTERVENTIONS: 97164- PT Re-evaluation, 97110-Therapeutic exercises, 97530- Therapeutic activity, 97112- Neuromuscular re-education, 97535- Self Care, [id number]- Manual therapy, 97116- Gait training, Taping, Dry Needling, Joint mobilization, Cryotherapy, and Moist heat  PLAN FOR NEXT SESSION: HEP review and update, manual techniques as appropriate, aerobic tasks, ROM and flexibility activities, strengthening and PREs, TPDN, gait and balance training as needed     Alm JAYSON Kingdom, PT 09/27/2023, 3:29 PM

## 2023-09-28 ENCOUNTER — Other Ambulatory Visit (HOSPITAL_COMMUNITY): Payer: Self-pay

## 2023-09-29 ENCOUNTER — Other Ambulatory Visit (HOSPITAL_COMMUNITY): Payer: Self-pay

## 2023-09-29 ENCOUNTER — Ambulatory Visit: Payer: 59

## 2023-09-29 ENCOUNTER — Ambulatory Visit: Payer: Self-pay

## 2023-09-29 DIAGNOSIS — M6281 Muscle weakness (generalized): Secondary | ICD-10-CM | POA: Diagnosis not present

## 2023-09-29 DIAGNOSIS — M25661 Stiffness of right knee, not elsewhere classified: Secondary | ICD-10-CM

## 2023-09-29 DIAGNOSIS — M25561 Pain in right knee: Secondary | ICD-10-CM

## 2023-09-29 NOTE — Therapy (Signed)
 OUTPATIENT PHYSICAL THERAPY TREATMENT   Patient Name: Caroline Gonzales MRN: [id number] DOB:21-Nov-1975, 48 y.o., female Today's Date: 09/29/2023  END OF SESSION:  PT End of Session - 09/29/23 0920     Visit Number 4    Number of Visits 8    Date for PT Re-Evaluation 11/15/23    Authorization Type WC    Authorization - Visit Number 4    Authorization - Number of Visits 8    PT Start Time 0930    PT Stop Time 1012    PT Time Calculation (min) 42 min    Activity Tolerance Patient tolerated treatment well    Behavior During Therapy [facility] for tasks assessed/performed                Past Medical History:  Diagnosis Date   Acute costochondritis 11/14/2021   Allergy    Zyrtec .   Anxiety    followed by Dr. Abel Abelson   Back pain    Complication of anesthesia    PONV   Depression    Fibroid    Gastroparesis 09/14/2012   gastric emptying study in 2014   GERD (gastroesophageal reflux disease)    Headache    Hypertriglyceridemia 10/07/2017   Nightmares 07/21/2018   Pneumonia    2013ish   PONV (postoperative nausea and vomiting)     likes scopolamine  patch and zofran  /phenergan  helps   Pre-diabetes    PTSD (post-traumatic stress disorder)    Residual foreign body in soft tissue 11/12/2016   Screening for colon cancer 07/02/2022   Sleep apnea 07/01/2018   cpap optional, pt close to not use cpap   Vitamin D  deficiency    Wears glasses    Past Surgical History:  Procedure Laterality Date   ABDOMINAL HYSTERECTOMY  01/2021   ANKLE ARTHROSCOPY Left 2011   CESAREAN SECTION  06/2006   x 1   CHOLECYSTECTOMY  07/2006   laparoscopic   CYSTOSCOPY N/A 02/04/2021   Procedure: CYSTOSCOPY;  Surgeon: Wanita Gutta, MD;  Location: [facility] OR;  Service: Gynecology;  Laterality: N/A;   DILATION AND CURETTAGE OF UTERUS  1997   x 2   FOREIGN BODY REMOVAL Left 11/18/2016   Procedure: REMOVAL FOREIGN BODY EXTREMITY LEFT FOOT;  Surgeon: Charity Conch, DPM;  Location: [facility] OR;   Service: Podiatry;  Laterality: Left;   GASTRIC ROUX-EN-Y N/A 04/13/2023   Procedure: LAPAROSCOPIC ROUX-EN-Y GASTRIC BYPASS WITH UPPER ENDOSCOPY;  Surgeon: Adalberto Acton, MD;  Location: [facility];  Service: General;  Laterality: N/A;   KNEE ARTHROSCOPY WITH MEDIAL MENISECTOMY Right 09/10/2021   Procedure: KNEE ARTHROSCOPY WITH MEDIAL MENISCAL ROOT REPAIR;  Surgeon: Micheline Ahr, MD;  Location: [facility];  Service: Orthopedics;  Laterality: Right;   PILONIDAL CYST EXCISION  1990's   RADIOLOGY WITH ANESTHESIA N/A 11/10/2018   Procedure: MRI WITH ANESTHESIA OF BRAIN AND ORBITS WITH AND WITHOUT CONTRAST;  Surgeon: Radiologist, Medication, MD;  Location: [facility] OR;  Service: Radiology;  Laterality: N/A;   TENDON REPAIR Left 2011   Left Ankle   TOTAL LAPAROSCOPIC HYSTERECTOMY WITH SALPINGECTOMY Bilateral 02/04/2021   Procedure: TOTAL LAPAROSCOPIC HYSTERECTOMY WITH SALPINGECTOMY;  Surgeon: Wanita Gutta, MD;  Location: [facility] OR;  Service: Gynecology;  Laterality: Bilateral;   UPPER GASTROINTESTINAL ENDOSCOPY     WISDOM TOOTH EXTRACTION  1990's   Patient Active Problem List   Diagnosis Date Noted   Family history of elevated lipoprotein (a) 04/26/2023   PVC's (premature ventricular contractions) 04/26/2023  Morbid obesity (HCC) 04/13/2023   Gastroesophageal reflux disease 12/24/2022   Abnormal laboratory test 12/24/2022   Chest wall pain 12/24/2022   Elevated blood pressure reading without diagnosis of hypertension 12/29/2021   Change in facial mole 12/29/2021   S/P laparoscopic hysterectomy 02/04/2021   PTSD (post-traumatic stress disorder) 07/21/2018   GAD (generalized anxiety disorder) 07/21/2018   Insomnia 07/21/2018   Recurrent major depressive disorder, in partial remission (HCC) 07/21/2018   Insulin  resistance 10/21/2017   Hypertriglyceridemia 10/07/2017   Vitamin D  deficiency 10/07/2017   Pre-diabetes 11/02/2016   Morbid obesity with body mass index (BMI) of 50.0  to 59.9 in adult (HCC) 10/15/2016    PCP: Clemente Cushing   REFERRING PROVIDER: Neil Balls, MD   REFERRING DIAG: injury ; LEFT HIP +  RIGHT KNEE INJURY  THERAPY DIAG:  Muscle weakness (generalized)  Pain in joint of right knee  Stiffness of right knee, not elsewhere classified  Rationale for Evaluation and Treatment: Rehabilitation  ONSET DATE: 08/13/23  SUBJECTIVE:   SUBJECTIVE STATEMENT: Pt presents to PT with reports of increased muscle soreness in quad, but denies knee pain. Feels soreness is post exercise related.   PERTINENT HISTORY: Per referral:  PAIN:  Are you having pain?  Yes: NPRS scale: 5/10 Pain location: R knee Pain description: ache Aggravating factors: squatting, kneeling, stairs(descending) Relieving factors: cessation of activity  PRECAUTIONS: None  RED FLAGS: None   WEIGHT BEARING RESTRICTIONS: No  FALLS:  Has patient fallen in last 6 months? Yes. Number of falls 1  OCCUPATION: research  PLOF: Independent  PATIENT GOALS: To manage my knee symptoms  NEXT MD VISIT: 10/03/22/5  OBJECTIVE:  Note: Objective measures were completed at Evaluation unless otherwise noted.  DIAGNOSTIC FINDINGS: See history  PATIENT SURVEYS:  FOTO 67(72 predicted)  EDEMA:  None observed  MUSCLE LENGTH: Hamstrings: Right 90 deg; Left 90 deg R ITB: Unremarkable  POSTURE: No Significant postural limitations  PALPATION: TTP R patellar tendon and underlying fat pad  LOWER EXTREMITY ROM: WNL  Active ROM Right eval Left eval  Hip flexion    Hip extension    Hip abduction    Hip adduction    Hip internal rotation    Hip external rotation    Knee flexion    Knee extension    Ankle dorsiflexion    Ankle plantarflexion    Ankle inversion    Ankle eversion     (Blank rows = not tested)  LOWER EXTREMITY MMT:  MMT Right eval Left eval  Hip flexion    Hip extension    Hip abduction    Hip adduction    Hip internal rotation    Hip  external rotation    Knee flexion    Knee extension 5-   Ankle dorsiflexion    Ankle plantarflexion    Ankle inversion    Ankle eversion     (Blank rows = not tested)  LOWER EXTREMITY SPECIAL TESTS:  Knee special tests: Anterior drawer test: negative, McMurray's test: negative, Patellafemoral apprehension test: negative, Lateral pull sign: negative, Patellafemoral grind test: negative, and Patella tap test (ballotable patella): negative  FUNCTIONAL TESTS:  30 seconds chair stand test TBD  GAIT: Distance walked: 4ftx2 Assistive device utilized: None Level of assistance: Complete Independence Comments: unremarkable  TREATMENT DATE:  [facility] Adult PT Treatment:                                                DATE: 09/29/23 Therapeutic Exercise: NuStep lvl 5 UE/LE x 4 min while taking subjective Prone quad stretch with strap 2x30" Supine QS x 10 - 5" hold Supine clamshell 2x15 GTB Bridge with ball 2x10 Modified thomas stretch x 60" each LAQ 2x10 each Trigger Point Dry Needling:  Initial Treatment: Pt instructed on Dry Needling rational, procedures, and possible side effects. Pt instructed to expect mild to moderate muscle soreness later in the day and/or into the next day.  Pt instructed to continue prescribed HEP.  Patient Verbal Consent Given: Yes Education Handout Provided: No  Muscles Treated: bilateral quads Electrical Stimulation Performed: Yes: x 8 min micro (high freq, low intensity) for pain modulation  Treatment Response/Outcome: twitch response and decrease in muscle tone  [facility] Adult PT Treatment:                                                DATE: 09/27/23 Therapeutic Exercise: NuStep lvl 5 UE/LE x 4 min while taking subjective Supine QS x 10 - 5" hold Supine SLR 2x10 2# Modified thomas stretch x 60" R SAQ 2x15 each Bridge 2x10 with  RTB S/L clamshell 2x15 RTB LAQ 2x10 2# Lateral walk YTB x 3 laps  Mini squat 2x10 - B UE  [facility] Adult PT Treatment:                                                DATE: 09/22/23 Therapeutic Exercise: NuStep lvl  Supine QS x 10 - 5" hold Supine SLR 2x10 R Bridge 2x10 S/L clamshell 2x15 RTB LAQ 2x10 4# R McConnell tape with lateral glide to R knee Trigger Point Dry Needling:  Initial Treatment: Pt instructed on Dry Needling rational, procedures, and possible side effects. Pt instructed to expect mild to moderate muscle soreness later in the day and/or into the next day.  Pt instructed to continue prescribed HEP.  Patient Verbal Consent Given: Yes Education Handout Provided: No   Muscles Treated: R quad (lateral) Electrical Stimulation Performed: No Treatment Response/Outcome: twitch response     [facility] Adult PT Treatment:                                                DATE: 09/17/23 Therapeutic Exercise: Demonstrated heel slides, eccentric FAQ's and QS's  Modalities: US  R patellar tendon/fat pad, 50%, 1 MHz 8 min  PATIENT EDUCATION:  Education details: Discussed eval findings, rehab rationale and POC and patient is in agreement  Person educated: Patient Education method: Explanation Education comprehension: verbalized understanding and needs further education  HOME EXERCISE PROGRAM: Access Code: [email or web address] URL: [email or web address] Date: 09/22/2023 Prepared by: Loral Roch  Exercises - Long Sitting Quad Set  - 1 x daily - 5 x weekly - 3 sets - 10 reps - Seated Long Arc  Quad  - 1 x daily - 5 x weekly - 3 sets - 10 reps - Supine Heel Slide  - 1 x daily - 5 x weekly - 3 sets - 10 reps - Clamshell with Resistance  - 1 x daily - 7 x weekly - 2 sets - 15 reps - red band hold  ASSESSMENT:  CLINICAL IMPRESSION: Pt was able to complete all prescribed exercises with no adverse effect, did have continued soreness post session. Overall she is progressing well with  therapy, noting decrease in pain of R knee and improving function with work. PT will continue per current POC and progress as able.   EVAL: Patient is a 48 y.o. female who was seen today for physical therapy evaluation and treatment for R knee pain. Patient presents with full AROM in R knee, mild extensor weakness and TTP across patellar tendon and surrounding fat pad.  Ligamentously stable throughout.  Patient is a good candidate for OPPT   OBJECTIVE IMPAIRMENTS: decreased activity tolerance, decreased knowledge of condition, decreased strength, and pain.   ACTIVITY LIMITATIONS: carrying, bending, sitting, squatting, and stairs  PERSONAL FACTORS: Age and Fitness are also affecting patient's functional outcome.   REHAB POTENTIAL: Good  CLINICAL DECISION MAKING: Stable/uncomplicated  EVALUATION COMPLEXITY: Low   GOALS: Goals reviewed with patient? No  SHORT TERM GOALS=LONG TERM GOALS: Target date: 10/15/2023   Patient to demonstrate independence in HEP  Baseline: Access Code: [email or web address] Goal status: INITIAL  2.  Patient will acknowledge 2/10 pain at least once during episode of care   Baseline: 5/10 at worst Goal status: INITIAL  3.  Patient will score at least 72% on FOTO to signify clinically meaningful improvement in functional abilities.   Baseline: 67 Goal status: INITIAL  4.  Assess and establish 30s chair stand test goal Baseline: TBD Goal status: INITIAL  5.  Increase R knee extensor strength to 5/5 Baseline: 5-/5 Goal status: INITIAL   PLAN:  PT FREQUENCY: 2x/week  PT DURATION: 4 weeks  PLANNED INTERVENTIONS: 97164- PT Re-evaluation, 97110-Therapeutic exercises, 97530- Therapeutic activity, 97112- Neuromuscular re-education, 97535- Self Care, [id number]- Manual therapy, 97116- Gait training, Taping, Dry Needling, Joint mobilization, Cryotherapy, and Moist heat  PLAN FOR NEXT SESSION: HEP review and update, manual techniques as appropriate, aerobic tasks, ROM and  flexibility activities, strengthening and PREs, TPDN, gait and balance training as needed     Ivor Mars, PT 09/29/2023, 10:45 AM

## 2023-10-01 ENCOUNTER — Ambulatory Visit: Payer: PRIVATE HEALTH INSURANCE

## 2023-10-01 DIAGNOSIS — M6281 Muscle weakness (generalized): Secondary | ICD-10-CM

## 2023-10-01 DIAGNOSIS — M25561 Pain in right knee: Secondary | ICD-10-CM

## 2023-10-01 NOTE — Therapy (Signed)
OUTPATIENT PHYSICAL THERAPY TREATMENT   Patient Name: Caroline Gonzales MRN: [id number] DOB:1976/05/04, 48 y.o., female Today's Date: 10/01/2023  END OF SESSION:  PT End of Session - 10/01/23 0835     Visit Number 5    Number of Visits 8    Date for PT Re-Evaluation 11/15/23    Authorization Type WC    Authorization - Visit Number 5    Authorization - Number of Visits 8    PT Start Time 0845    PT Stop Time 0925    PT Time Calculation (min) 40 min    Activity Tolerance Patient tolerated treatment well    Behavior During Therapy [facility] for tasks assessed/performed                 Past Medical History:  Diagnosis Date   Acute costochondritis 11/14/2021   Allergy    Zyrtec.   Anxiety    followed by Dr. Toni Arthurs   Back pain    Complication of anesthesia    PONV   Depression    Fibroid    Gastroparesis 09/14/2012   gastric emptying study in 2014   GERD (gastroesophageal reflux disease)    Headache    Hypertriglyceridemia 10/07/2017   Nightmares 07/21/2018   Pneumonia    2013ish   PONV (postoperative nausea and vomiting)     likes scopolamine patch and zofran /phenergan helps   Pre-diabetes    PTSD (post-traumatic stress disorder)    Residual foreign body in soft tissue 11/12/2016   Screening for colon cancer 07/02/2022   Sleep apnea 07/01/2018   cpap optional, pt close to not use cpap   Vitamin D deficiency    Wears glasses    Past Surgical History:  Procedure Laterality Date   ABDOMINAL HYSTERECTOMY  01/2021   ANKLE ARTHROSCOPY Left 2011   CESAREAN SECTION  06/2006   x 1   CHOLECYSTECTOMY  07/2006   laparoscopic   CYSTOSCOPY N/A 02/04/2021   Procedure: CYSTOSCOPY;  Surgeon: Romualdo Bolk, MD;  Location: [facility] OR;  Service: Gynecology;  Laterality: N/A;   DILATION AND CURETTAGE OF UTERUS  1997   x 2   FOREIGN BODY REMOVAL Left 11/18/2016   Procedure: REMOVAL FOREIGN BODY EXTREMITY LEFT FOOT;  Surgeon: Vivi Barrack, DPM;  Location: [facility] OR;   Service: Podiatry;  Laterality: Left;   GASTRIC ROUX-EN-Y N/A 04/13/2023   Procedure: LAPAROSCOPIC ROUX-EN-Y GASTRIC BYPASS WITH UPPER ENDOSCOPY;  Surgeon: Berna Bue, MD;  Location: [facility] ORS;  Service: General;  Laterality: N/A;   KNEE ARTHROSCOPY WITH MEDIAL MENISECTOMY Right 09/10/2021   Procedure: KNEE ARTHROSCOPY WITH MEDIAL MENISCAL ROOT REPAIR;  Surgeon: Bjorn Pippin, MD;  Location: [facility];  Service: Orthopedics;  Laterality: Right;   PILONIDAL CYST EXCISION  1990's   RADIOLOGY WITH ANESTHESIA N/A 11/10/2018   Procedure: MRI WITH ANESTHESIA OF BRAIN AND ORBITS WITH AND WITHOUT CONTRAST;  Surgeon: Radiologist, Medication, MD;  Location: [facility] OR;  Service: Radiology;  Laterality: N/A;   TENDON REPAIR Left 2011   Left Ankle   TOTAL LAPAROSCOPIC HYSTERECTOMY WITH SALPINGECTOMY Bilateral 02/04/2021   Procedure: TOTAL LAPAROSCOPIC HYSTERECTOMY WITH SALPINGECTOMY;  Surgeon: Romualdo Bolk, MD;  Location: [facility] OR;  Service: Gynecology;  Laterality: Bilateral;   UPPER GASTROINTESTINAL ENDOSCOPY     WISDOM TOOTH EXTRACTION  1990's   Patient Active Problem List   Diagnosis Date Noted   Family history of elevated lipoprotein (a) 04/26/2023   PVC's (premature ventricular contractions) 04/26/2023  Morbid obesity (HCC) 04/13/2023   Gastroesophageal reflux disease 12/24/2022   Abnormal laboratory test 12/24/2022   Chest wall pain 12/24/2022   Elevated blood pressure reading without diagnosis of hypertension 12/29/2021   Change in facial mole 12/29/2021   S/P laparoscopic hysterectomy 02/04/2021   PTSD (post-traumatic stress disorder) 07/21/2018   GAD (generalized anxiety disorder) 07/21/2018   Insomnia 07/21/2018   Recurrent major depressive disorder, in partial remission (HCC) 07/21/2018   Insulin resistance 10/21/2017   Hypertriglyceridemia 10/07/2017   Vitamin D deficiency 10/07/2017   Pre-diabetes 11/02/2016   Morbid obesity with body mass index (BMI) of 50.0  to 59.9 in adult [facility]) 10/15/2016    PCP: Stacey Drain   REFERRING PROVIDER: Jodi Geralds, MD   REFERRING DIAG: injury ; LEFT HIP +  RIGHT KNEE INJURY  THERAPY DIAG:  Muscle weakness (generalized)  Pain in joint of right knee  Rationale for Evaluation and Treatment: Rehabilitation  ONSET DATE: 08/13/23  SUBJECTIVE:   SUBJECTIVE STATEMENT: Pt presents to PT with reports of continued muscle soreness but her knee is doing well. Has continued HEP compliance.   PERTINENT HISTORY: Per referral:  PAIN:  Are you having pain?  Yes: NPRS scale: 5/10 Pain location: R knee Pain description: ache Aggravating factors: squatting, kneeling, stairs(descending) Relieving factors: cessation of activity  PRECAUTIONS: None  RED FLAGS: None   WEIGHT BEARING RESTRICTIONS: No  FALLS:  Has patient fallen in last 6 months? Yes. Number of falls 1  OCCUPATION: research  PLOF: Independent  PATIENT GOALS: To manage my knee symptoms  NEXT MD VISIT: 10/03/22/5  OBJECTIVE:  Note: Objective measures were completed at Evaluation unless otherwise noted.  DIAGNOSTIC FINDINGS: See history  PATIENT SURVEYS:  FOTO 67(72 predicted)  EDEMA:  None observed  MUSCLE LENGTH: Hamstrings: Right 90 deg; Left 90 deg R ITB: Unremarkable  POSTURE: No Significant postural limitations  PALPATION: TTP R patellar tendon and underlying fat pad  LOWER EXTREMITY ROM: WNL  Active ROM Right eval Left eval  Hip flexion    Hip extension    Hip abduction    Hip adduction    Hip internal rotation    Hip external rotation    Knee flexion    Knee extension    Ankle dorsiflexion    Ankle plantarflexion    Ankle inversion    Ankle eversion     (Blank rows = not tested)  LOWER EXTREMITY MMT:  MMT Right eval Left eval  Hip flexion    Hip extension    Hip abduction    Hip adduction    Hip internal rotation    Hip external rotation    Knee flexion    Knee extension 5-   Ankle  dorsiflexion    Ankle plantarflexion    Ankle inversion    Ankle eversion     (Blank rows = not tested)  LOWER EXTREMITY SPECIAL TESTS:  Knee special tests: Anterior drawer test: negative, McMurray's test: negative, Patellafemoral apprehension test: negative, Lateral pull sign: negative, Patellafemoral grind test: negative, and Patella tap test (ballotable patella): negative  FUNCTIONAL TESTS:  30 seconds chair stand test TBD  GAIT: Distance walked: 1ftx2 Assistive device utilized: None Level of assistance: Complete Independence Comments: unremarkable  TREATMENT DATE:  [facility] Adult PT Treatment:                                                DATE: 10/01/23 Therapeutic Exercise: NuStep lvl 5 UE/LE x 5 min while taking subjective Prone quad stretch with strap 2x30" Supine QS x 10 - 5" hold Supine SLR 2x10 each Supine clamshell 2x15 blue band Bridge with blue band 3x10 Modified thomas stretch x 60" each FAQ with ball 2x15 STS x 10   [facility] Adult PT Treatment:                                                DATE: 09/29/23 Therapeutic Exercise: NuStep lvl 5 UE/LE x 4 min while taking subjective Prone quad stretch with strap 2x30" Supine QS x 10 - 5" hold Supine clamshell 2x15 GTB Bridge with ball 2x10 Modified thomas stretch x 60" each LAQ 2x10 each Trigger Point Dry Needling:  Initial Treatment: Pt instructed on Dry Needling rational, procedures, and possible side effects. Pt instructed to expect mild to moderate muscle soreness later in the day and/or into the next day.  Pt instructed to continue prescribed HEP.  Patient Verbal Consent Given: Yes Education Handout Provided: No  Muscles Treated: bilateral quads Electrical Stimulation Performed: Yes: x 8 min micro (high freq, low intensity) for pain modulation  Treatment Response/Outcome: twitch  response and decrease in muscle tone  [facility] Adult PT Treatment:                                                DATE: 09/27/23 Therapeutic Exercise: NuStep lvl 5 UE/LE x 4 min while taking subjective Supine QS x 10 - 5" hold Supine SLR 2x10 2# Modified thomas stretch x 60" R SAQ 2x15 each Bridge 2x10 with RTB S/L clamshell 2x15 RTB LAQ 2x10 2# Lateral walk YTB x 3 laps  Mini squat 2x10 - B UE  [facility] Adult PT Treatment:                                                DATE: 09/22/23 Therapeutic Exercise: NuStep lvl  Supine QS x 10 - 5" hold Supine SLR 2x10 R Bridge 2x10 S/L clamshell 2x15 RTB LAQ 2x10 4# R McConnell tape with lateral glide to R knee Trigger Point Dry Needling:  Initial Treatment: Pt instructed on Dry Needling rational, procedures, and possible side effects. Pt instructed to expect mild to moderate muscle soreness later in the day and/or into the next day.  Pt instructed to continue prescribed HEP.  Patient Verbal Consent Given: Yes Education Handout Provided: No   Muscles Treated: R quad (lateral) Electrical Stimulation Performed: No Treatment Response/Outcome: twitch response     [facility] Adult PT Treatment:  DATE: 09/17/23 Therapeutic Exercise: Demonstrated heel slides, eccentric FAQ's and QS's  Modalities: [name] R patellar tendon/fat pad, 50%, 1 MHz 8 min  PATIENT EDUCATION:  Education details: Discussed eval findings, rehab rationale and POC and patient is in agreement  Person educated: Patient Education method: Explanation Education comprehension: verbalized understanding and needs further education  HOME EXERCISE PROGRAM: Access Code: [email or web address] URL: [email or web address] Date: 09/22/2023 Prepared by: Edwinna Areola  Exercises - Long Sitting Quad Set  - 1 x daily - 5 x weekly - 3 sets - 10 reps - Seated Long Arc Quad  - 1 x daily - 5 x weekly - 3 sets - 10 reps - Supine Heel Slide  - 1 x daily - 5  x weekly - 3 sets - 10 reps - Clamshell with Resistance  - 1 x daily - 7 x weekly - 2 sets - 15 reps - red band hold  ASSESSMENT:  CLINICAL IMPRESSION: Pt was once again able to complete all prescribed exercises with no adverse effect. Therapy today continued focus on quad and proximal hip strengthening with decreased intensity due to continued muscle soreness. Overall she is doing well and denies pain in R knee. Pt sees MD on 10/04/23. Will continue with therapy as tolerated per POC.   EVAL: Patient is a 48 y.o. female who was seen today for physical therapy evaluation and treatment for R knee pain. Patient presents with full AROM in R knee, mild extensor weakness and TTP across patellar tendon and surrounding fat pad.  Ligamentously stable throughout.  Patient is a good candidate for OPPT   OBJECTIVE IMPAIRMENTS: decreased activity tolerance, decreased knowledge of condition, decreased strength, and pain.   ACTIVITY LIMITATIONS: carrying, bending, sitting, squatting, and stairs  PERSONAL FACTORS: Age and Fitness are also affecting patient's functional outcome.   REHAB POTENTIAL: Good  CLINICAL DECISION MAKING: Stable/uncomplicated  EVALUATION COMPLEXITY: Low   GOALS: Goals reviewed with patient? No  SHORT TERM GOALS=LONG TERM GOALS: Target date: 10/15/2023   Patient to demonstrate independence in HEP  Baseline: Access Code: [email or web address] Goal status: INITIAL  2.  Patient will acknowledge 2/10 pain at least once during episode of care   Baseline: 5/10 at worst Goal status: INITIAL  3.  Patient will score at least 72% on FOTO to signify clinically meaningful improvement in functional abilities.   Baseline: 67 Goal status: INITIAL  4.  Assess and establish 30s chair stand test goal Baseline: TBD Goal status: INITIAL  5.  Increase R knee extensor strength to 5/5 Baseline: 5-/5 Goal status: INITIAL   PLAN:  PT FREQUENCY: 2x/week  PT DURATION: 4 weeks  PLANNED  INTERVENTIONS: 97164- PT Re-evaluation, 97110-Therapeutic exercises, 97530- Therapeutic activity, 97112- Neuromuscular re-education, 97535- Self Care, 40102- Manual therapy, 97116- Gait training, Taping, Dry Needling, Joint mobilization, Cryotherapy, and Moist heat  PLAN FOR NEXT SESSION: HEP review and update, manual techniques as appropriate, aerobic tasks, ROM and flexibility activities, strengthening and PREs, TPDN, gait and balance training as needed     Eloy End, PT 10/01/2023, 9:27 AM

## 2023-10-05 ENCOUNTER — Other Ambulatory Visit (HOSPITAL_COMMUNITY): Payer: Self-pay

## 2023-10-05 MED ORDER — PANTOPRAZOLE SODIUM 40 MG PO TBEC
40.0000 mg | DELAYED_RELEASE_TABLET | Freq: Every day | ORAL | 0 refills | Status: DC
  Filled 2023-10-05: qty 90, 90d supply, fill #0

## 2023-10-06 ENCOUNTER — Other Ambulatory Visit (HOSPITAL_COMMUNITY): Payer: Self-pay

## 2023-10-07 ENCOUNTER — Ambulatory Visit: Payer: PRIVATE HEALTH INSURANCE | Admitting: Physical Therapy

## 2023-10-07 ENCOUNTER — Encounter: Payer: Self-pay | Admitting: Physical Therapy

## 2023-10-07 DIAGNOSIS — M6281 Muscle weakness (generalized): Secondary | ICD-10-CM | POA: Diagnosis not present

## 2023-10-07 DIAGNOSIS — M25561 Pain in right knee: Secondary | ICD-10-CM

## 2023-10-07 NOTE — Therapy (Signed)
OUTPATIENT PHYSICAL THERAPY TREATMENT   Patient Name: Caroline Gonzales MRN: [id number] DOB:[birth year], 48 y.o., female Today's Date: 10/07/2023  END OF SESSION:  PT End of Session - 10/07/23 1305     Visit Number 6    Number of Visits 8    Date for PT Re-Evaluation 11/15/23    Authorization Type WC    Authorization - Visit Number 6    Authorization - Number of Visits 8    PT Start Time 1311    PT Stop Time 1349    PT Time Calculation (min) 38 min                 Past Medical History:  Diagnosis Date   Acute costochondritis 11/14/2021   Allergy    Zyrtec.   Anxiety    followed by Dr. Toni Arthurs   Back pain    Complication of anesthesia    PONV   Depression    Fibroid    Gastroparesis 09/14/2012   gastric emptying study in 2014   GERD (gastroesophageal reflux disease)    Headache    Hypertriglyceridemia 10/07/2017   Nightmares 07/21/2018   Pneumonia    2013ish   PONV (postoperative nausea and vomiting)     likes scopolamine patch and zofran /phenergan helps   Pre-diabetes    PTSD (post-traumatic stress disorder)    Residual foreign body in soft tissue 11/12/2016   Screening for colon cancer 07/02/2022   Sleep apnea 07/01/2018   cpap optional, pt close to not use cpap   Vitamin D deficiency    Wears glasses    Past Surgical History:  Procedure Laterality Date   ABDOMINAL HYSTERECTOMY  01/2021   ANKLE ARTHROSCOPY Left 2011   CESAREAN SECTION  06/2006   x 1   CHOLECYSTECTOMY  07/2006   laparoscopic   CYSTOSCOPY N/A 02/04/2021   Procedure: CYSTOSCOPY;  Surgeon: Romualdo Bolk, MD;  Location: [facility] OR;  Service: Gynecology;  Laterality: N/A;   DILATION AND CURETTAGE OF UTERUS  1997   x 2   FOREIGN BODY REMOVAL Left 11/18/2016   Procedure: REMOVAL FOREIGN BODY EXTREMITY LEFT FOOT;  Surgeon: Vivi Barrack, DPM;  Location: [facility] OR;  Service: Podiatry;  Laterality: Left;   GASTRIC ROUX-EN-Y N/A 04/13/2023   Procedure: LAPAROSCOPIC ROUX-EN-Y GASTRIC  BYPASS WITH UPPER ENDOSCOPY;  Surgeon: Berna Bue, MD;  Location: [facility];  Service: General;  Laterality: N/A;   KNEE ARTHROSCOPY WITH MEDIAL MENISECTOMY Right 09/10/2021   Procedure: KNEE ARTHROSCOPY WITH MEDIAL MENISCAL ROOT REPAIR;  Surgeon: Bjorn Pippin, MD;  Location: [facility];  Service: Orthopedics;  Laterality: Right;   PILONIDAL CYST EXCISION  1990's   RADIOLOGY WITH ANESTHESIA N/A 11/10/2018   Procedure: MRI WITH ANESTHESIA OF BRAIN AND ORBITS WITH AND WITHOUT CONTRAST;  Surgeon: Radiologist, Medication, MD;  Location: [facility] OR;  Service: Radiology;  Laterality: N/A;   TENDON REPAIR Left 2011   Left Ankle   TOTAL LAPAROSCOPIC HYSTERECTOMY WITH SALPINGECTOMY Bilateral 02/04/2021   Procedure: TOTAL LAPAROSCOPIC HYSTERECTOMY WITH SALPINGECTOMY;  Surgeon: Romualdo Bolk, MD;  Location: [facility] OR;  Service: Gynecology;  Laterality: Bilateral;   UPPER GASTROINTESTINAL ENDOSCOPY     WISDOM TOOTH EXTRACTION  1990's   Patient Active Problem List   Diagnosis Date Noted   Family history of elevated lipoprotein (a) 04/26/2023   PVC's (premature ventricular contractions) 04/26/2023   Morbid obesity (HCC) 04/13/2023   Gastroesophageal reflux disease 12/24/2022   Abnormal laboratory test 12/24/2022  Chest wall pain 12/24/2022   Elevated blood pressure reading without diagnosis of hypertension 12/29/2021   Change in facial mole 12/29/2021   S/P laparoscopic hysterectomy 02/04/2021   PTSD (post-traumatic stress disorder) 07/21/2018   GAD (generalized anxiety disorder) 07/21/2018   Insomnia 07/21/2018   Recurrent major depressive disorder, in partial remission (HCC) 07/21/2018   Insulin resistance 10/21/2017   Hypertriglyceridemia 10/07/2017   Vitamin D deficiency 10/07/2017   Pre-diabetes 11/02/2016   Morbid obesity with body mass index (BMI) of 50.0 to 59.9 in adult [facility]) 10/15/2016    PCP: Stacey Drain   REFERRING PROVIDER: Jodi Geralds, MD   REFERRING  DIAG: injury ; LEFT HIP +  RIGHT KNEE INJURY  THERAPY DIAG:  Muscle weakness (generalized)  Pain in joint of right knee  Rationale for Evaluation and Treatment: Rehabilitation  ONSET DATE: 08/13/23  SUBJECTIVE:   SUBJECTIVE STATEMENT: I have been released from MD to RTW full duty and started back 2 days ago. 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 The swelling is pretty much gone. I have pain 1-2/10 from kneeling yesterday to clean and get under the bed. I feel like I am back to baseline. My casemanager and MD want be to complete my last 2 WC visits and come back one last time FEB 13th to make sure I am doing good with RTW.    PERTINENT HISTORY: Per referral:  PAIN:  Are you having pain?  Yes: NPRS scale: 1-2/10 Pain location: R knee Pain description: ache Aggravating factors: squatting, kneeling, stairs(descending) Relieving factors: cessation of activity  PRECAUTIONS: None  RED FLAGS: None   WEIGHT BEARING RESTRICTIONS: No  FALLS:  Has patient fallen in last 6 months? Yes. Number of falls 1  OCCUPATION: research  PLOF: Independent  PATIENT GOALS: To manage my knee symptoms  NEXT MD VISIT: 10/03/22/5,  Feb 13th   OBJECTIVE:  Note: Objective measures were completed at Evaluation unless otherwise noted.  DIAGNOSTIC FINDINGS: See history  PATIENT SURVEYS:  FOTO 67(72 predicted) 10/07/23: FOTO 98 %   EDEMA:  None observed  MUSCLE LENGTH: Hamstrings: Right 90 deg; Left 90 deg R ITB: Unremarkable  POSTURE: No Significant postural limitations  PALPATION: TTP R patellar tendon and underlying fat pad  LOWER EXTREMITY ROM: WNL  Active ROM Right eval Left eval  Hip flexion    Hip extension    Hip abduction    Hip adduction    Hip internal rotation    Hip external rotation    Knee flexion    Knee extension    Ankle dorsiflexion    Ankle plantarflexion    Ankle inversion    Ankle eversion      (Blank rows = not tested)  LOWER EXTREMITY MMT:  MMT Right eval Left eval  Hip flexion    Hip extension    Hip abduction    Hip adduction    Hip internal rotation    Hip external rotation    Knee flexion    Knee extension 5-   Ankle dorsiflexion    Ankle plantarflexion    Ankle inversion    Ankle eversion     (Blank rows = not tested)  LOWER EXTREMITY SPECIAL TESTS:  Knee special tests: Anterior drawer test: negative, McMurray's test: negative, Patellafemoral apprehension test: negative, Lateral pull sign: negative, Patellafemoral grind test: negative, and Patella tap test (ballotable patella): negative  FUNCTIONAL TESTS:  30 seconds chair stand test TBD  GAIT: Distance walked: 41ftx2 Assistive device utilized: None Level of assistance: Complete Independence Comments: unremarkable                                                                                                                                TREATMENT DATE:  [facility] Adult PT Treatment:                                                DATE: 10/07/23 Therapeutic Exercise: Nustep L5 UE/LE x 5 minutes  Prone quad stretch with strap 2 x 30 sec each  LAQ 4# 10 x 2  Bridge with ball squeeze  Right SLR 2# to fatigue Right hip flexor stretch Side hip abduction 2# to fatigue     [facility] Adult PT Treatment:                                                DATE: 10/01/23 Therapeutic Exercise: NuStep lvl 5 UE/LE x 5 min while taking subjective Prone quad stretch with strap 2x30" Supine QS x 10 - 5" hold Supine SLR 2x10 each Supine clamshell 2x15 blue band Bridge with blue band 3x10 Modified thomas stretch x 60" each FAQ with ball 2x15 STS x 10   [facility] Adult PT Treatment:                                                DATE: 09/29/23 Therapeutic Exercise: NuStep lvl 5 UE/LE x 4 min while taking subjective Prone quad stretch with strap 2x30" Supine QS x 10 - 5" hold Supine clamshell 2x15 GTB Bridge with ball  2x10 Modified thomas stretch x 60" each LAQ 2x10 each Trigger Point Dry Needling:  Initial Treatment: Pt instructed on Dry Needling rational, procedures, and possible side effects. Pt instructed to expect mild to moderate muscle soreness later in the day and/or into the next day.  Pt instructed to continue prescribed HEP.  Patient Verbal Consent Given: Yes Education Handout Provided: No  Muscles Treated: bilateral quads Electrical Stimulation Performed: Yes: x 8 min micro (high freq, low intensity) for pain modulation  Treatment Response/Outcome: twitch response and decrease in muscle tone  [facility] Adult PT Treatment:                                                DATE: 09/27/23 Therapeutic Exercise: NuStep lvl 5  UE/LE x 4 min while taking subjective Supine QS x 10 - 5" hold Supine SLR 2x10 2# Modified thomas stretch x 60" R SAQ 2x15 each Bridge 2x10 with RTB S/L clamshell 2x15 RTB LAQ 2x10 2# Lateral walk YTB x 3 laps  Mini squat 2x10 - B UE  [facility] Adult PT Treatment:                                                DATE: 09/22/23 Therapeutic Exercise: NuStep lvl  Supine QS x 10 - 5" hold Supine SLR 2x10 R Bridge 2x10 S/L clamshell 2x15 RTB LAQ 2x10 4# R McConnell tape with lateral glide to R knee Trigger Point Dry Needling:  Initial Treatment: Pt instructed on Dry Needling rational, procedures, and possible side effects. Pt instructed to expect mild to moderate muscle soreness later in the day and/or into the next day.  Pt instructed to continue prescribed HEP.  Patient Verbal Consent Given: Yes Education Handout Provided: No   Muscles Treated: R quad (lateral) Electrical Stimulation Performed: No Treatment Response/Outcome: twitch response     [facility] Adult PT Treatment:                                                DATE: 09/17/23 Therapeutic Exercise: Demonstrated heel slides, eccentric FAQ's and QS's  Modalities: [name] R patellar tendon/fat pad, 50%, 1 MHz 8 min  PATIENT  EDUCATION:  Education details: Discussed eval findings, rehab rationale and POC and patient is in agreement  Person educated: Patient Education method: Explanation Education comprehension: verbalized understanding and needs further education  HOME EXERCISE PROGRAM: Access Code: [email or web address] URL: [email or web address] Date: 09/22/2023 Prepared by: Edwinna Areola  Exercises - Long Sitting Quad Set  - 1 x daily - 5 x weekly - 3 sets - 10 reps - Seated Long Arc Quad  - 1 x daily - 5 x weekly - 3 sets - 10 reps - Supine Heel Slide  - 1 x daily - 5 x weekly - 3 sets - 10 reps - Clamshell with Resistance  - 1 x daily - 7 x weekly - 2 sets - 15 reps - red band hold  ASSESSMENT:  CLINICAL IMPRESSION: Pt saw MD who released her to RTW full duty. 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 She believes she is back to baseline. Her FOTO score has significantly improved, meeting LTG # 3. Her pain is averaging 1-2/10, meeting LTG #2. She will f/u with MD once more on Feb 13 to ensure she is doing well with RTW. He recommended she finish her last 2 scheduled PT appointments. Will see her for one more visit to finalize HEP and assess response to RTW.   EVAL: Patient is a 48 y.o. female who was seen today for physical therapy evaluation and treatment for R knee pain. Patient presents with full AROM in R knee, mild extensor weakness and TTP across patellar tendon and surrounding fat pad.  Ligamentously stable throughout.  Patient is a good candidate for OPPT   OBJECTIVE IMPAIRMENTS: decreased activity tolerance, decreased knowledge of condition, decreased strength, and pain.   ACTIVITY LIMITATIONS: carrying, bending, sitting, squatting, and stairs  PERSONAL FACTORS: Age and  Fitness are also affecting patient's functional outcome.   REHAB POTENTIAL: Good  CLINICAL DECISION MAKING: Stable/uncomplicated  EVALUATION COMPLEXITY: Low   GOALS: Goals reviewed with  patient? No  SHORT TERM GOALS=LONG TERM GOALS: Target date: 10/15/2023   Patient to demonstrate independence in HEP  Baseline: Access Code: [email or web address] Goal status: INITIAL  2.  Patient will acknowledge 2/10 pain at least once during episode of care   Baseline: 5/10 at worst 10/07/23:1-2/10  Goal status: MET  3.  Patient will score at least 72% on FOTO to signify clinically meaningful improvement in functional abilities.   Baseline: 67 10/07/23: 98%  Goal status: MET  4.  Assess and establish 30s chair stand test goal Baseline: TBD 10/07/23: 20 reps  Goal status: ONGOING   5.  Increase R knee extensor strength to 5/5 Baseline: 5-/5 Goal status: INITIAL   PLAN:  PT FREQUENCY: 2x/week  PT DURATION: 4 weeks  PLANNED INTERVENTIONS: 97164- PT Re-evaluation, 97110-Therapeutic exercises, 97530- Therapeutic activity, 97112- Neuromuscular re-education, 97535- Self Care, 81191- Manual therapy, 97116- Gait training, Taping, Dry Needling, Joint mobilization, Cryotherapy, and Moist heat  PLAN FOR NEXT SESSION: HEP review and update, manual techniques as appropriate, aerobic tasks, ROM and flexibility activities, strengthening and PREs, TPDN, gait and balance training as needed     Jannette Spanner, PTA 10/07/23 1:51 PM Phone: [phone] Fax: [phone]

## 2023-10-13 ENCOUNTER — Other Ambulatory Visit (HOSPITAL_COMMUNITY): Payer: Self-pay

## 2023-10-13 ENCOUNTER — Encounter: Payer: Self-pay | Admitting: Physical Therapy

## 2023-10-13 ENCOUNTER — Other Ambulatory Visit: Payer: Self-pay

## 2023-10-13 ENCOUNTER — Ambulatory Visit: Payer: PRIVATE HEALTH INSURANCE | Attending: Orthopedic Surgery | Admitting: Physical Therapy

## 2023-10-13 DIAGNOSIS — M25561 Pain in right knee: Secondary | ICD-10-CM | POA: Insufficient documentation

## 2023-10-13 DIAGNOSIS — M25661 Stiffness of right knee, not elsewhere classified: Secondary | ICD-10-CM | POA: Diagnosis not present

## 2023-10-13 DIAGNOSIS — R262 Difficulty in walking, not elsewhere classified: Secondary | ICD-10-CM | POA: Insufficient documentation

## 2023-10-13 DIAGNOSIS — M6281 Muscle weakness (generalized): Secondary | ICD-10-CM | POA: Insufficient documentation

## 2023-10-13 NOTE — Patient Instructions (Signed)
Access Code: [email or web address] URL: [email or web address] Date: 10/13/2023 Prepared by: Rosana Hoes  Exercises - Long Sitting Quad Set  - 1 x daily - 5 x weekly - 3 sets - 10 reps - Seated Long Arc Quad  - 1 x daily - 5 x weekly - 3 sets - 10 reps - Supine Heel Slide  - 1 x daily - 5 x weekly - 3 sets - 10 reps - Clamshell with Resistance  - 1 x daily - 7 x weekly - 2 sets - 15 reps - red band hold - Wall Squat  - 3 sets - 10 reps - Side Stepping with Resistance at Thighs and Counter Support  - 3 sets - 20 reps

## 2023-10-13 NOTE — Therapy (Signed)
OUTPATIENT PHYSICAL THERAPY TREATMENT   Patient Name: Caroline Gonzales MRN: [id number] DOB:[birth year], 48 y.o., female Today's Date: 10/13/2023  END OF SESSION:  PT End of Session - 10/13/23 1204     Visit Number 7    Number of Visits 8    Date for PT Re-Evaluation 11/15/23    Authorization Type WC    Authorization - Visit Number 7    Authorization - Number of Visits 8    PT Start Time 1100    PT Stop Time 1140    PT Time Calculation (min) 40 min    Activity Tolerance Patient tolerated treatment well    Behavior During Therapy [facility] for tasks assessed/performed                  Past Medical History:  Diagnosis Date   Acute costochondritis 11/14/2021   Allergy    Zyrtec.   Anxiety    followed by Dr. Toni Arthurs   Back pain    Complication of anesthesia    PONV   Depression    Fibroid    Gastroparesis 09/14/2012   gastric emptying study in 2014   GERD (gastroesophageal reflux disease)    Headache    Hypertriglyceridemia 10/07/2017   Nightmares 07/21/2018   Pneumonia    2013ish   PONV (postoperative nausea and vomiting)     likes scopolamine patch and zofran /phenergan helps   Pre-diabetes    PTSD (post-traumatic stress disorder)    Residual foreign body in soft tissue 11/12/2016   Screening for colon cancer 07/02/2022   Sleep apnea 07/01/2018   cpap optional, pt close to not use cpap   Vitamin D deficiency    Wears glasses    Past Surgical History:  Procedure Laterality Date   ABDOMINAL HYSTERECTOMY  01/2021   ANKLE ARTHROSCOPY Left 2011   CESAREAN SECTION  06/2006   x 1   CHOLECYSTECTOMY  07/2006   laparoscopic   CYSTOSCOPY N/A 02/04/2021   Procedure: CYSTOSCOPY;  Surgeon: Romualdo Bolk, MD;  Location: [facility] OR;  Service: Gynecology;  Laterality: N/A;   DILATION AND CURETTAGE OF UTERUS  1997   x 2   FOREIGN BODY REMOVAL Left 11/18/2016   Procedure: REMOVAL FOREIGN BODY EXTREMITY LEFT FOOT;  Surgeon: Vivi Barrack, DPM;  Location: [facility] OR;   Service: Podiatry;  Laterality: Left;   GASTRIC ROUX-EN-Y N/A 04/13/2023   Procedure: LAPAROSCOPIC ROUX-EN-Y GASTRIC BYPASS WITH UPPER ENDOSCOPY;  Surgeon: Berna Bue, MD;  Location: [facility];  Service: General;  Laterality: N/A;   KNEE ARTHROSCOPY WITH MEDIAL MENISECTOMY Right 09/10/2021   Procedure: KNEE ARTHROSCOPY WITH MEDIAL MENISCAL ROOT REPAIR;  Surgeon: Bjorn Pippin, MD;  Location: [facility];  Service: Orthopedics;  Laterality: Right;   PILONIDAL CYST EXCISION  1990's   RADIOLOGY WITH ANESTHESIA N/A 11/10/2018   Procedure: MRI WITH ANESTHESIA OF BRAIN AND ORBITS WITH AND WITHOUT CONTRAST;  Surgeon: Radiologist, Medication, MD;  Location: [facility] OR;  Service: Radiology;  Laterality: N/A;   TENDON REPAIR Left 2011   Left Ankle   TOTAL LAPAROSCOPIC HYSTERECTOMY WITH SALPINGECTOMY Bilateral 02/04/2021   Procedure: TOTAL LAPAROSCOPIC HYSTERECTOMY WITH SALPINGECTOMY;  Surgeon: Romualdo Bolk, MD;  Location: [facility] OR;  Service: Gynecology;  Laterality: Bilateral;   UPPER GASTROINTESTINAL ENDOSCOPY     WISDOM TOOTH EXTRACTION  1990's   Patient Active Problem List   Diagnosis Date Noted   Family history of elevated lipoprotein (a) 04/26/2023   PVC's (premature ventricular contractions) 04/26/2023  Morbid obesity (HCC) 04/13/2023   Gastroesophageal reflux disease 12/24/2022   Abnormal laboratory test 12/24/2022   Chest wall pain 12/24/2022   Elevated blood pressure reading without diagnosis of hypertension 12/29/2021   Change in facial mole 12/29/2021   S/P laparoscopic hysterectomy 02/04/2021   PTSD (post-traumatic stress disorder) 07/21/2018   GAD (generalized anxiety disorder) 07/21/2018   Insomnia 07/21/2018   Recurrent major depressive disorder, in partial remission (HCC) 07/21/2018   Insulin resistance 10/21/2017   Hypertriglyceridemia 10/07/2017   Vitamin D deficiency 10/07/2017   Pre-diabetes 11/02/2016   Morbid obesity with body mass index (BMI) of 50.0  to 59.9 in adult [facility]) 10/15/2016    PCP: Stacey Drain   REFERRING PROVIDER: Jodi Geralds, MD   REFERRING DIAG: injury ; LEFT HIP +  RIGHT KNEE INJURY  THERAPY DIAG:  Muscle weakness (generalized)  Pain in joint of right knee  Stiffness of right knee, not elsewhere classified  Difficulty walking  Rationale for Evaluation and Treatment: Rehabilitation  ONSET DATE: 08/13/23  SUBJECTIVE:   SUBJECTIVE STATEMENT: Patient reports she has been kneeling and going up/down stairs more at work and at home, so she is having a little more pain.  PERTINENT HISTORY: Per referral:   PAIN:  Are you having pain?  Yes: NPRS scale: 2/10 Pain location: Right knee Pain description: ache Aggravating factors: squatting, kneeling, stairs(descending) Relieving factors: cessation of activity  PRECAUTIONS: None  RED FLAGS: None   WEIGHT BEARING RESTRICTIONS: No  FALLS:  Has patient fallen in last 6 months? Yes. Number of falls 1  OCCUPATION: research  PLOF: Independent  PATIENT GOALS: To manage my knee symptoms   OBJECTIVE:  Note: Objective measures were completed at Evaluation unless otherwise noted. PATIENT SURVEYS:  FOTO 67(72 predicted) 10/07/23: FOTO 98 %  EDEMA:  None observed  MUSCLE LENGTH: Hamstrings: Right 90 deg; Left 90 deg R ITB: Unremarkable  POSTURE: No Significant postural limitations  PALPATION: TTP R patellar tendon and underlying fat pad  LOWER EXTREMITY ROM: WNL  Active ROM Right eval Left eval  Hip flexion    Hip extension    Hip abduction    Hip adduction    Hip internal rotation    Hip external rotation    Knee flexion    Knee extension    Ankle dorsiflexion    Ankle plantarflexion    Ankle inversion    Ankle eversion     (Blank rows = not tested)  LOWER EXTREMITY MMT:  MMT Right eval Left eval  Hip flexion    Hip extension    Hip abduction    Hip adduction    Hip internal rotation    Hip external rotation    Knee  flexion    Knee extension 5-   Ankle dorsiflexion    Ankle plantarflexion    Ankle inversion    Ankle eversion     (Blank rows = not tested)  LOWER EXTREMITY SPECIAL TESTS:  Knee special tests: Anterior drawer test: negative, McMurray's test: negative, Patellafemoral apprehension test: negative, Lateral pull sign: negative, Patellafemoral grind test: negative, and Patella tap test (ballotable patella): negative  FUNCTIONAL TESTS:  30 seconds chair stand test TBD  GAIT: Distance walked: 47ftx2 Assistive device utilized: None Level of assistance: Complete Independence Comments: unremarkable  TREATMENT DATE:  [facility] Adult PT Treatment:                                                DATE: 10/13/23 Therapeutic Exercise: Nustep L5 UE/LE x 5 minutes while taking subjective SLR x 10 right Bridge x 10 Sidelying hip abduction 2 x 10 right Squat to table tap x 10, holding 15# at chest 2 x 10 Lateral heel tap 2" x 10, 4" 2 x 10 Wall squat x 10 Trial split squat at counter but discontinued due to knee discomfort Side stepping at counter with green at knees 2 x 5 lengths down/back Side step-up-and-over 6" 2 x 10   [facility] Adult PT Treatment:                                                DATE: 10/07/23 Therapeutic Exercise: Nustep L5 UE/LE x 5 minutes  Prone quad stretch with strap 2 x 30 sec each  LAQ 4# 10 x 2  Bridge with ball squeeze  Right SLR 2# to fatigue Right hip flexor stretch Side hip abduction 2# to fatigue   [facility] Adult PT Treatment:                                                DATE: 10/01/23 Therapeutic Exercise: NuStep lvl 5 UE/LE x 5 min while taking subjective Prone quad stretch with strap 2x30" Supine QS x 10 - 5" hold Supine SLR 2x10 each Supine clamshell 2x15 blue band Bridge with blue band 3x10 Modified thomas stretch x 60" each FAQ with  ball 2x15 STS x 10   [facility] Adult PT Treatment:                                                DATE: 09/29/23 Therapeutic Exercise: NuStep lvl 5 UE/LE x 4 min while taking subjective Prone quad stretch with strap 2x30" Supine QS x 10 - 5" hold Supine clamshell 2x15 GTB Bridge with ball 2x10 Modified thomas stretch x 60" each LAQ 2x10 each Trigger Point Dry Needling:  Initial Treatment: Pt instructed on Dry Needling rational, procedures, and possible side effects. Pt instructed to expect mild to moderate muscle soreness later in the day and/or into the next day.  Pt instructed to continue prescribed HEP.  Patient Verbal Consent Given: Yes Education Handout Provided: No  Muscles Treated: bilateral quads Electrical Stimulation Performed: Yes: x 8 min micro (high freq, low intensity) for pain modulation  Treatment Response/Outcome: twitch response and decrease in muscle tone  [facility] Adult PT Treatment:                                                DATE: 09/27/23 Therapeutic Exercise: NuStep lvl 5 UE/LE x 4 min while taking subjective Supine QS x 10 - 5" hold  Supine SLR 2x10 2# Modified thomas stretch x 60" R SAQ 2x15 each Bridge 2x10 with RTB S/L clamshell 2x15 RTB LAQ 2x10 2# Lateral walk YTB x 3 laps  Mini squat 2x10 - B UE  [facility] Adult PT Treatment:                                                DATE: 09/22/23 Therapeutic Exercise: NuStep lvl  Supine QS x 10 - 5" hold Supine SLR 2x10 R Bridge 2x10 S/L clamshell 2x15 RTB LAQ 2x10 4# R McConnell tape with lateral glide to R knee Trigger Point Dry Needling:  Initial Treatment: Pt instructed on Dry Needling rational, procedures, and possible side effects. Pt instructed to expect mild to moderate muscle soreness later in the day and/or into the next day.  Pt instructed to continue prescribed HEP.  Patient Verbal Consent Given: Yes Education Handout Provided: No   Muscles Treated: R quad (lateral) Electrical Stimulation  Performed: No Treatment Response/Outcome: twitch response   PATIENT EDUCATION:  Education details: Discussed eval findings, rehab rationale and POC and patient is in agreement  Person educated: Patient Education method: Explanation Education comprehension: verbalized understanding and needs further education  HOME EXERCISE PROGRAM: Access Code: [email or web address]   ASSESSMENT:  CLINICAL IMPRESSION: Patient tolerated therapy well with no adverse effects. Therapy focused on continued progressing of knee and hip strengthening and finalizing her HEP. She was able to tolerate increased weight and resistance with her exercises and squats. She did report some right knee pain with squats to table when adding weight, but no pain with wall squats or step-up exercises due to reduced knee flexion when in closed chain so she was encouraged to gradually increase depth of squatting or lunging as tolerated. Updated her HEP to progress knee and hip strengthening for home. Patient has achieved all her LTGs and is pleased with current functional status so will be formally discharged from PT.   EVAL: Patient is a 48 y.o. female who was seen today for physical therapy evaluation and treatment for R knee pain. Patient presents with full AROM in R knee, mild extensor weakness and TTP across patellar tendon and surrounding fat pad.  Ligamentously stable throughout.  Patient is a good candidate for OPPT   OBJECTIVE IMPAIRMENTS: decreased activity tolerance, decreased knowledge of condition, decreased strength, and pain.   ACTIVITY LIMITATIONS: carrying, bending, sitting, squatting, and stairs  PERSONAL FACTORS: Age and Fitness are also affecting patient's functional outcome.   REHAB POTENTIAL: Good  CLINICAL DECISION MAKING: Stable/uncomplicated  EVALUATION COMPLEXITY: Low   GOALS: Goals reviewed with patient? No  SHORT TERM GOALS=LONG TERM GOALS: Target date: 10/15/2023   Patient to demonstrate independence in  HEP  Baseline: Access Code: [email or web address] 10/13/2023: independent Goal status: MET  2.  Patient will acknowledge 2/10 pain at least once during episode of care   Baseline: 5/10 at worst 10/07/23:1-2/10  Goal status: MET  3.  Patient will score at least 72% on FOTO to signify clinically meaningful improvement in functional abilities.   Baseline: 67 10/07/23: 98%  Goal status: MET  4.  Assess and establish 30s chair stand test goal Baseline: TBD 10/07/23: 20 reps  Goal status: MET  5.  Increase R knee extensor strength to 5/5 Baseline: 5-/5 10/13/2023: 5/5 Goal status: MET   PLAN:  PT FREQUENCY: 2x/week  PT DURATION: 4 weeks  PLANNED INTERVENTIONS: 97164- PT Re-evaluation, 97110-Therapeutic exercises, 97530- Therapeutic activity, 97112- Neuromuscular re-education, 97535- Self Care, 40981- Manual therapy, 97116- Gait training, Taping, Dry Needling, Joint mobilization, Cryotherapy, and Moist heat  PLAN FOR NEXT SESSION: HEP review and update, manual techniques as appropriate, aerobic tasks, ROM and flexibility activities, strengthening and PREs, TPDN, gait and balance training as needed     Rosana Hoes, PT, DPT, LAT, ATC 10/13/23  12:21 PM Phone: [phone] Fax: [phone]   PHYSICAL THERAPY DISCHARGE SUMMARY  Visits from Start of Care: 7  Current functional level related to goals / functional outcomes: See above   Remaining deficits: See above   Education / Equipment: HEP   Patient agrees to discharge. Patient goals were met. Patient is being discharged due to meeting the stated rehab goals.

## 2023-10-15 ENCOUNTER — Other Ambulatory Visit (HOSPITAL_COMMUNITY): Payer: Self-pay

## 2023-10-26 ENCOUNTER — Encounter: Payer: Commercial Managed Care - PPO | Attending: Surgery | Admitting: Skilled Nursing Facility1

## 2023-10-26 VITALS — Wt 210.1 lb

## 2023-10-26 DIAGNOSIS — E669 Obesity, unspecified: Secondary | ICD-10-CM | POA: Diagnosis not present

## 2023-10-27 ENCOUNTER — Encounter: Payer: Self-pay | Admitting: Skilled Nursing Facility1

## 2023-10-27 NOTE — Progress Notes (Signed)
Follow-up visit:  Post-Operative RYGB Surgery  Medical Nutrition Therapy:  Appt start time: 6:00pm end time:  7:00pm  Primary concerns today: Post-operative Bariatric Surgery Nutrition Management 6 Month Post-Op Class  Sx date: 04/13/2023  Anthropometrics  Start weight at [facility]: 266.5 lbs (date: 12/22/2022)  Height: 65 in Weight today: 228.5   Clinical   Pharmacotherapy: History of weight loss medication used: [id number]   Medical hx: GERD, hypercholesterolemia, obesity, vit D deficiency Medications: seroquel, luvox, vit D3, prazosin, nexium, zofran, propranolol, dlonopin, biotin, multivitamin colace, wegovy. Labs: triglycerides 357, HDL 33, VLDL 55 Notable signs/symptoms: none noted Any previous deficiencies? No Bowel Habits: Every day to every other day no complaints   Body Composition Scale 10/27/2023  Weight  lbs 210.1  Total Body Fat  % 40.3     Visceral Fat 12  Fat-Free Mass  % 59.6     Total Body Water  % 44.3     Muscle-Mass  lbs 31.9  BMI 34.6  Body Fat Displacement ---        Torso  lbs 52.4        Left Leg  lbs 10.4        Right Leg  lbs 10.4        Left Arm  lbs 5.2        Right Arm  lbs 5.2     Information Reviewed/ Discussed During Appointment: -Review of composition scale numbers -Fluid requirements (64-100 ounces) -Protein requirements (60-80g) -Strategies for tolerating diet -Advancement of diet to include Starchy vegetables -Barriers to inclusion of new foods -Inclusion of appropriate multivitamin and calcium supplements  -Exercise recommendations   Fluid intake: adequate   Medications: See List Supplementation: appropriate    Using straws: no Drinking while eating: no Having you been chewing well: yes Chewing/swallowing difficulties: no Changes in vision: no Changes to mood/headaches: no Hair loss/Cahnges to skin/Changes to nails: no Any difficulty focusing or concentrating: no Sweating: no Dizziness/Lightheaded: no Palpitations: no   Carbonated beverages: no N/V/D/C/GAS: no Abdominal Pain: no Dumping syndrome: no  Recent physical activity:  ADL's  Progress Towards Goal(s):  In Progress Teaching method utilized: Visual & Auditory  Demonstrated degree of understanding via: Teach Back  Readiness Level: Action Barriers to learning/adherence to lifestyle change: none identified  Handouts given during visit include: Phase V diet Progression  Goals Sheet The Benefits of Exercise are endless..... Support Group Topics   Teaching Method Utilized:  Visual Auditory Hands on  Demonstrated degree of understanding via:  Teach Back   Monitoring/Evaluation:  Dietary intake, exercise, and body weight. Follow up in 3 months for 9 month post-op visit.

## 2023-11-02 ENCOUNTER — Other Ambulatory Visit (HOSPITAL_COMMUNITY): Payer: Self-pay

## 2023-11-02 ENCOUNTER — Other Ambulatory Visit: Payer: Self-pay

## 2023-11-02 ENCOUNTER — Ambulatory Visit (INDEPENDENT_AMBULATORY_CARE_PROVIDER_SITE_OTHER): Payer: Commercial Managed Care - PPO | Admitting: Obstetrics and Gynecology

## 2023-11-02 ENCOUNTER — Encounter: Payer: Self-pay | Admitting: Obstetrics and Gynecology

## 2023-11-02 ENCOUNTER — Other Ambulatory Visit: Payer: Self-pay | Admitting: Nurse Practitioner

## 2023-11-02 VITALS — BP 104/60 | HR 85

## 2023-11-02 DIAGNOSIS — Z1231 Encounter for screening mammogram for malignant neoplasm of breast: Secondary | ICD-10-CM

## 2023-11-02 DIAGNOSIS — Z1331 Encounter for screening for depression: Secondary | ICD-10-CM | POA: Diagnosis not present

## 2023-11-02 DIAGNOSIS — Z6841 Body Mass Index (BMI) 40.0 and over, adult: Secondary | ICD-10-CM

## 2023-11-02 DIAGNOSIS — E785 Hyperlipidemia, unspecified: Secondary | ICD-10-CM | POA: Diagnosis not present

## 2023-11-02 DIAGNOSIS — N951 Menopausal and female climacteric states: Secondary | ICD-10-CM

## 2023-11-02 DIAGNOSIS — R7303 Prediabetes: Secondary | ICD-10-CM | POA: Diagnosis not present

## 2023-11-02 DIAGNOSIS — Z01419 Encounter for gynecological examination (general) (routine) without abnormal findings: Secondary | ICD-10-CM

## 2023-11-02 DIAGNOSIS — E559 Vitamin D deficiency, unspecified: Secondary | ICD-10-CM | POA: Diagnosis not present

## 2023-11-02 DIAGNOSIS — E2839 Other primary ovarian failure: Secondary | ICD-10-CM

## 2023-11-02 MED ORDER — ONDANSETRON 8 MG PO TBDP
8.0000 mg | ORAL_TABLET | Freq: Three times a day (TID) | ORAL | 1 refills | Status: DC | PRN
  Filled 2023-11-02: qty 30, 10d supply, fill #0
  Filled 2023-12-08: qty 30, 10d supply, fill #1

## 2023-11-02 MED ORDER — INTRAROSA 6.5 MG VA INST: 1.0000 | VAGINAL_INSERT | Freq: Every evening | VAGINAL | 12 refills | Status: AC | PRN

## 2023-11-02 MED ORDER — ESTRADIOL 0.025 MG/24HR TD PTTW
1.0000 | MEDICATED_PATCH | TRANSDERMAL | 12 refills | Status: DC
  Filled 2023-11-02: qty 8, 28d supply, fill #0
  Filled 2023-11-24: qty 8, 28d supply, fill #1
  Filled 2023-12-21: qty 8, 28d supply, fill #2
  Filled 2024-01-20: qty 8, 28d supply, fill #3
  Filled 2024-02-16: qty 8, 28d supply, fill #4
  Filled 2024-03-09: qty 8, 28d supply, fill #5
  Filled 2024-04-09: qty 8, 28d supply, fill #6
  Filled 2024-05-03: qty 8, 28d supply, fill #7
  Filled 2024-05-31: qty 8, 28d supply, fill #8
  Filled 2024-06-23: qty 8, 28d supply, fill #9
  Filled 2024-07-31: qty 8, 28d supply, fill #10
  Filled 2024-08-21: qty 8, 28d supply, fill #11
  Filled 2024-09-17: qty 8, 28d supply, fill #12
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
## 2023-11-03 ENCOUNTER — Encounter: Payer: Self-pay | Admitting: Obstetrics and Gynecology

## 2023-11-03 LAB — ESTRADIOL: Estradiol: 17 pg/mL

## 2023-11-03 LAB — VITAMIN D 25 HYDROXY (VIT D DEFICIENCY, FRACTURES): Vit D, 25-Hydroxy: 84 ng/mL (ref 30–100)

## 2023-11-03 LAB — FOLLICLE STIMULATING HORMONE: FSH: 69.6 m[IU]/mL

## 2023-11-04 ENCOUNTER — Other Ambulatory Visit: Payer: Self-pay

## 2023-11-09 DIAGNOSIS — F4310 Post-traumatic stress disorder, unspecified: Secondary | ICD-10-CM | POA: Diagnosis not present

## 2023-11-10 ENCOUNTER — Other Ambulatory Visit (HOSPITAL_COMMUNITY): Payer: Self-pay

## 2023-11-10 ENCOUNTER — Other Ambulatory Visit: Payer: Self-pay

## 2023-11-11 ENCOUNTER — Encounter: Payer: Self-pay | Admitting: Obstetrics and Gynecology

## 2023-11-11 ENCOUNTER — Ambulatory Visit (HOSPITAL_BASED_OUTPATIENT_CLINIC_OR_DEPARTMENT_OTHER)
Admission: RE | Admit: 2023-11-11 | Discharge: 2023-11-11 | Disposition: A | Discharge status: Home / Self Care | Payer: Commercial Managed Care - PPO | Source: Ambulatory Visit | Attending: Obstetrics and Gynecology | Admitting: Obstetrics and Gynecology

## 2023-11-11 DIAGNOSIS — Z01419 Encounter for gynecological examination (general) (routine) without abnormal findings: Secondary | ICD-10-CM | POA: Insufficient documentation

## 2023-11-11 DIAGNOSIS — M85851 Other specified disorders of bone density and structure, right thigh: Secondary | ICD-10-CM | POA: Diagnosis not present

## 2023-11-11 DIAGNOSIS — E2839 Other primary ovarian failure: Secondary | ICD-10-CM | POA: Diagnosis not present

## 2023-11-18 DIAGNOSIS — F4310 Post-traumatic stress disorder, unspecified: Secondary | ICD-10-CM | POA: Diagnosis not present

## 2023-11-19 DIAGNOSIS — Z9884 Bariatric surgery status: Secondary | ICD-10-CM | POA: Diagnosis not present

## 2023-11-23 ENCOUNTER — Other Ambulatory Visit (HOSPITAL_COMMUNITY): Payer: Self-pay | Admitting: Orthopedic Surgery

## 2023-11-23 ENCOUNTER — Other Ambulatory Visit (HOSPITAL_COMMUNITY): Payer: Self-pay

## 2023-11-23 DIAGNOSIS — F4310 Post-traumatic stress disorder, unspecified: Secondary | ICD-10-CM | POA: Diagnosis not present

## 2023-11-23 DIAGNOSIS — M25561 Pain in right knee: Secondary | ICD-10-CM

## 2023-11-23 DIAGNOSIS — Z9884 Bariatric surgery status: Secondary | ICD-10-CM | POA: Diagnosis not present

## 2023-11-27 DIAGNOSIS — R0602 Shortness of breath: Secondary | ICD-10-CM | POA: Diagnosis not present

## 2023-11-27 DIAGNOSIS — R079 Chest pain, unspecified: Secondary | ICD-10-CM | POA: Diagnosis not present

## 2023-11-27 DIAGNOSIS — R0789 Other chest pain: Secondary | ICD-10-CM | POA: Diagnosis not present

## 2023-11-27 DIAGNOSIS — F419 Anxiety disorder, unspecified: Secondary | ICD-10-CM | POA: Diagnosis not present

## 2023-11-27 DIAGNOSIS — R42 Dizziness and giddiness: Secondary | ICD-10-CM | POA: Diagnosis not present

## 2023-11-29 ENCOUNTER — Encounter (HOSPITAL_BASED_OUTPATIENT_CLINIC_OR_DEPARTMENT_OTHER): Payer: Self-pay

## 2023-11-29 ENCOUNTER — Ambulatory Visit (HOSPITAL_COMMUNITY)
Admission: RE | Admit: 2023-11-29 | Discharge: 2023-11-29 | Disposition: A | Discharge status: Home / Self Care | Payer: Self-pay | Source: Ambulatory Visit | Attending: Orthopedic Surgery | Admitting: Orthopedic Surgery

## 2023-11-29 DIAGNOSIS — M25461 Effusion, right knee: Secondary | ICD-10-CM | POA: Diagnosis not present

## 2023-11-29 DIAGNOSIS — M1711 Unilateral primary osteoarthritis, right knee: Secondary | ICD-10-CM | POA: Diagnosis not present

## 2023-11-29 DIAGNOSIS — M25561 Pain in right knee: Secondary | ICD-10-CM | POA: Insufficient documentation

## 2023-12-07 ENCOUNTER — Encounter: Payer: Self-pay | Admitting: Nurse Practitioner

## 2023-12-08 ENCOUNTER — Other Ambulatory Visit: Payer: Self-pay

## 2023-12-08 ENCOUNTER — Other Ambulatory Visit (HOSPITAL_COMMUNITY): Payer: Self-pay

## 2023-12-08 ENCOUNTER — Telehealth (HOSPITAL_BASED_OUTPATIENT_CLINIC_OR_DEPARTMENT_OTHER): Payer: Self-pay | Admitting: Cardiology

## 2023-12-08 NOTE — Telephone Encounter (Signed)
   Pre-operative Risk Assessment    Patient Name: Caroline Gonzales  DOB: 21-Feb-1976 MRN: [id number]   Date of last office visit:  Date of next office visit:    Request for Surgical Clearance    Procedure:   a right knee Arthroscopy  Date of Surgery:  12-29-23                                 Surgeon:   Dr Jodi Geralds Surgeon's Group or Practice Name:   Phone number:  [phone] Fax number:  [phone]   Type of Clearance Requested:  Medical and Medicine- unclear about medicine     Type of Anesthesia:  General    Additional requests/questions:    Signed, Laurence Ferrari   12/08/2023, 11:47 AM

## 2023-12-08 NOTE — Telephone Encounter (Signed)
   Name: Caroline Gonzales  DOB: [birth year]  MRN: [id number]  Primary Cardiologist: Jodelle Red, MD   Preoperative team, please contact this patient and set up a phone call appointment for further preoperative risk assessment. Please obtain consent and complete medication review. Thank you for your help.  I confirm that guidance regarding antiplatelet and oral anticoagulation therapy has been completed and, if necessary, noted below.  Patient not currently taking any anticoagulants or  antiplatelet therapy.  I also confirmed the patient resides in the state of [place]. As per [facility] Medical Board telemedicine laws, the patient must reside in the state in which the provider is licensed.   Ronney Asters, NP 12/08/2023, 12:01 PM [facility]

## 2023-12-08 NOTE — Telephone Encounter (Signed)
 Lvm for pt to call office to schedule pre op tele visit.

## 2023-12-09 ENCOUNTER — Other Ambulatory Visit (HOSPITAL_COMMUNITY): Payer: Self-pay

## 2023-12-09 DIAGNOSIS — F4310 Post-traumatic stress disorder, unspecified: Secondary | ICD-10-CM | POA: Diagnosis not present

## 2023-12-11 IMAGING — CR DG CHEST 2V
2 series · 2 of 2 positions shown · non-contrast
Comparison: 11/14/2020

CLINICAL DATA: Chest pain

EXAM:
CHEST - 2 VIEW

[w chest pa]
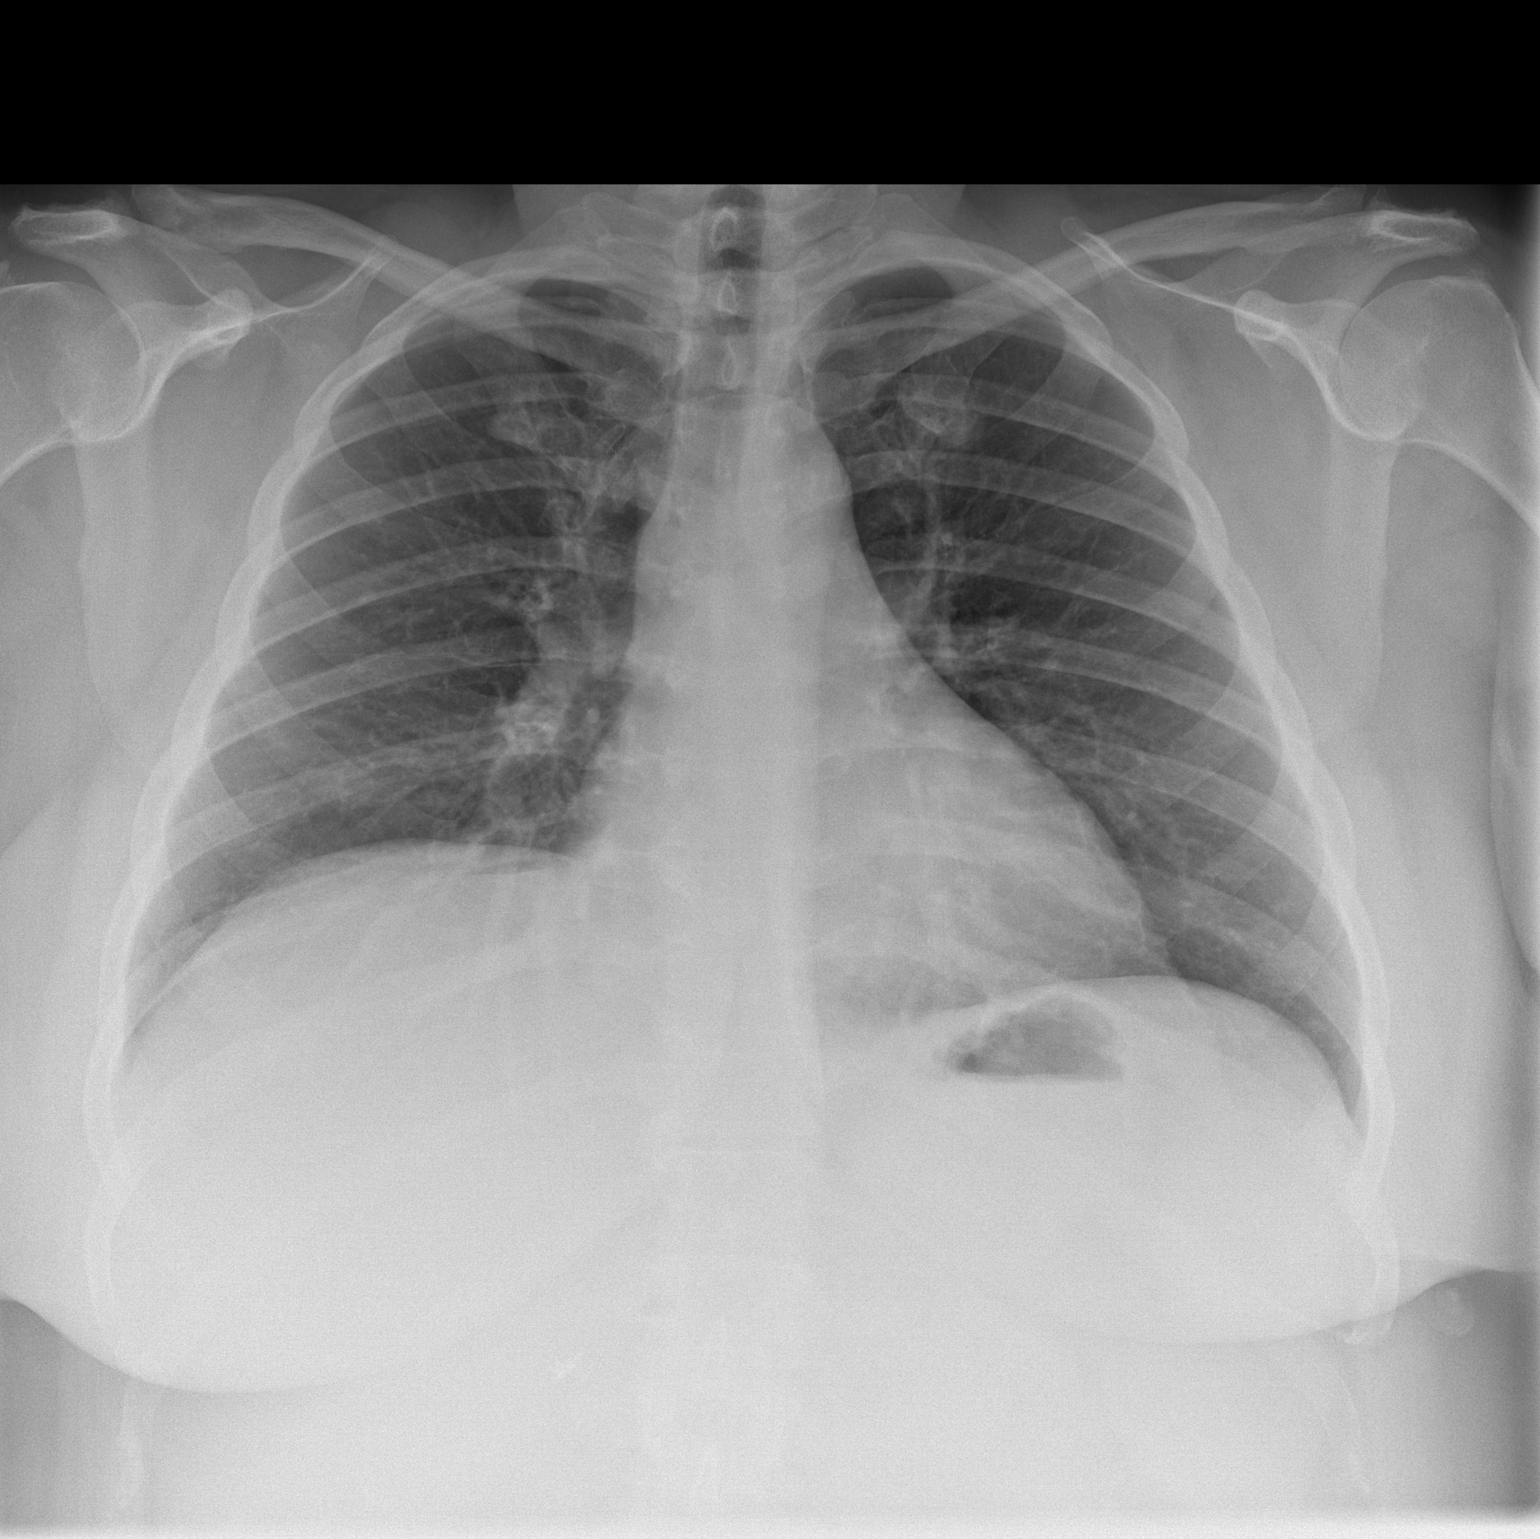

[w chest lat]
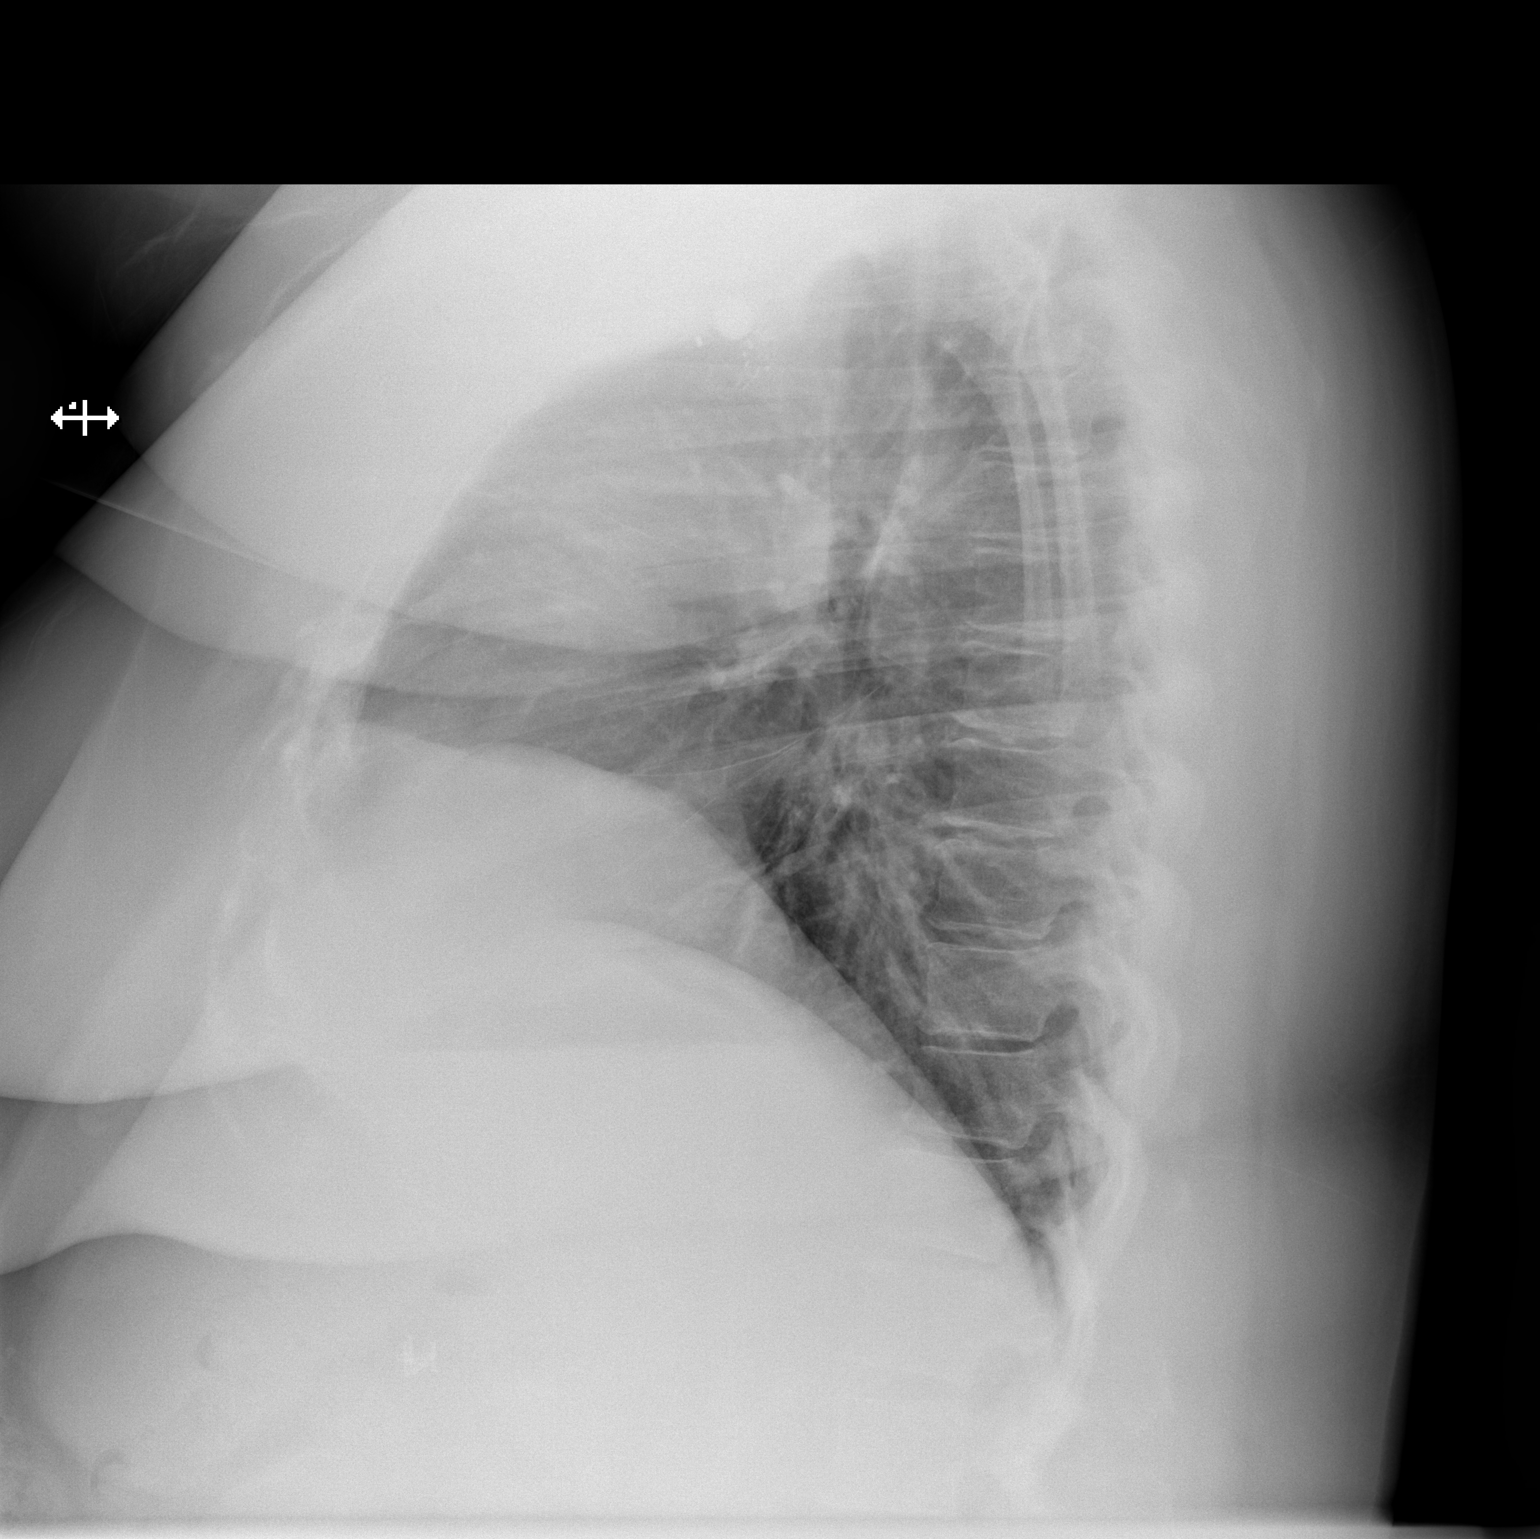

[2 of 2 positions shown; findings below may reference images not displayed]

FINDINGS: The heart size and mediastinal contours are within normal limits.
Both lungs are clear. The visualized skeletal structures are
unremarkable.
IMPRESSION: No active cardiopulmonary disease.

## 2023-12-14 DIAGNOSIS — F4310 Post-traumatic stress disorder, unspecified: Secondary | ICD-10-CM | POA: Diagnosis not present

## 2023-12-14 NOTE — Telephone Encounter (Signed)
2nd attempt to reach the pt to set up tele pre op appt.  

## 2023-12-15 ENCOUNTER — Telehealth: Payer: Self-pay | Admitting: *Deleted

## 2023-12-15 NOTE — Telephone Encounter (Signed)
 S/w the pt and she has been scheduled tele preop appt 12/20/23. Med rec and consent are done.       Patient Consent for Virtual Visit        Caroline Gonzales has provided verbal consent on 12/15/2023 for a virtual visit (video or telephone).   CONSENT FOR VIRTUAL VISIT FOR:  Caroline Gonzales  By participating in this virtual visit I agree to the following:  I hereby voluntarily request, consent and authorize [facility] and its employed or contracted physicians, physician assistants, nurse practitioners or other licensed health care professionals (the Practitioner), to provide me with telemedicine health care services (the "Services") as deemed necessary by the treating Practitioner. I acknowledge and consent to receive the Services by the Practitioner via telemedicine. I understand that the telemedicine visit will involve communicating with the Practitioner through live audiovisual communication technology and the disclosure of certain medical information by electronic transmission. I acknowledge that I have been given the opportunity to request an in-person assessment or other available alternative prior to the telemedicine visit and am voluntarily participating in the telemedicine visit.  I understand that I have the right to withhold or withdraw my consent to the use of telemedicine in the course of my care at any time, without affecting my right to future care or treatment, and that the Practitioner or I may terminate the telemedicine visit at any time. I understand that I have the right to inspect all information obtained and/or recorded in the course of the telemedicine visit and may receive copies of available information for a reasonable fee.  I understand that some of the potential risks of receiving the Services via telemedicine include:  Delay or interruption in medical evaluation due to technological equipment failure or disruption; Information transmitted may not be  sufficient (e.g. poor resolution of images) to allow for appropriate medical decision making by the Practitioner; and/or  In rare instances, security protocols could fail, causing a breach of personal health information.  Furthermore, I acknowledge that it is my responsibility to provide information about my medical history, conditions and care that is complete and accurate to the best of my ability. I acknowledge that Practitioner's advice, recommendations, and/or decision may be based on factors not within their control, such as incomplete or inaccurate data provided by me or distortions of diagnostic images or specimens that may result from electronic transmissions. I understand that the practice of medicine is not an exact science and that Practitioner makes no warranties or guarantees regarding treatment outcomes. I acknowledge that a copy of this consent can be made available to me via my patient portal [facility] MyChart), or I can request a printed copy by calling the office of [facility].    I understand that my insurance will be billed for this visit.   I have read or had this consent read to me. I understand the contents of this consent, which adequately explains the benefits and risks of the Services being provided via telemedicine.  I have been provided ample opportunity to ask questions regarding this consent and the Services and have had my questions answered to my satisfaction. I give my informed consent for the services to be provided through the use of telemedicine in my medical care

## 2023-12-15 NOTE — Telephone Encounter (Signed)
 Pt returning call to nurse. She asked that you call her back on her work number [phone]

## 2023-12-15 NOTE — Telephone Encounter (Signed)
 S/w the pt and she has been scheduled tele preop appt 12/20/23. Med rec and consent are done.

## 2023-12-20 ENCOUNTER — Ambulatory Visit: Attending: Cardiovascular Disease | Admitting: Emergency Medicine

## 2023-12-20 DIAGNOSIS — Z01810 Encounter for preprocedural cardiovascular examination: Secondary | ICD-10-CM

## 2023-12-20 NOTE — Progress Notes (Addendum)
 Virtual Visit via Telephone Note   Because of Caroline Gonzales co-morbid illnesses, she is at least at moderate risk for complications without adequate follow up.  This format is felt to be most appropriate for this patient at this time.  Due to technical limitations with video connection (technology), today's appointment will be conducted as an audio only telehealth visit, and [name] verbally agreed to proceed in this manner.   All issues noted in this document were discussed and addressed.  No physical exam could be performed with this format.  Evaluation Performed:  Preoperative cardiovascular risk assessment _____________   Date:  12/20/2023   Patient ID:  Caroline Gonzales, DOB 08-11-1976, MRN [id number] Patient Location:  Home Provider location:   Office  Primary Care Provider:  Tollie Eth, NP Primary Cardiologist:  Jodelle Red, MD  Chief Complaint / Patient Profile   48 y.o. y/o female with a h/o palpitations, PVCs, obesity who is pending right knee arthroscopy and presents today for telephonic preoperative cardiovascular risk assessment.  History of Present Illness    Caroline Gonzales is a 48 y.o. female who presents via audio/video conferencing for a telehealth visit today.  Pt was last seen in cardiology clinic on 04/26/2023 by Dr. Cristal Deer.  At that time [name] was doing well.  The patient is now pending procedure as outlined above. Since her last visit, she  denies chest pain, shortness of breath, lower extremity edema, fatigue, palpitations, melena, hematuria, hemoptysis, diaphoresis, weakness, presyncope, syncope, orthopnea, and PND.  Past Medical History    Past Medical History:  Diagnosis Date   Acute costochondritis 11/14/2021   Allergy    Zyrtec.   Anxiety    followed by Dr. Toni Arthurs   Back pain    Complication of anesthesia    PONV   Depression    Fibroid    Gastroparesis 09/14/2012   gastric emptying study in 2014    GERD (gastroesophageal reflux disease)    Headache    Hypertriglyceridemia 10/07/2017   Nightmares 07/21/2018   Pneumonia    2013ish   PONV (postoperative nausea and vomiting)     likes scopolamine patch and zofran /phenergan helps   Pre-diabetes    PTSD (post-traumatic stress disorder)    Residual foreign body in soft tissue 11/12/2016   Screening for colon cancer 07/02/2022   Sleep apnea 07/01/2018   cpap optional, pt close to not use cpap   Vitamin D deficiency    Wears glasses    Past Surgical History:  Procedure Laterality Date   ABDOMINAL HYSTERECTOMY  01/2021   ANKLE ARTHROSCOPY Left 2011   CESAREAN SECTION  06/2006   x 1   CHOLECYSTECTOMY  07/2006   laparoscopic   CYSTOSCOPY N/A 02/04/2021   Procedure: CYSTOSCOPY;  Surgeon: Romualdo Bolk, MD;  Location: [facility] OR;  Service: Gynecology;  Laterality: N/A;   DILATION AND CURETTAGE OF UTERUS  1997   x 2   FOREIGN BODY REMOVAL Left 11/18/2016   Procedure: REMOVAL FOREIGN BODY EXTREMITY LEFT FOOT;  Surgeon: Vivi Barrack, DPM;  Location: [facility] OR;  Service: Podiatry;  Laterality: Left;   GASTRIC ROUX-EN-Y N/A 04/13/2023   Procedure: LAPAROSCOPIC ROUX-EN-Y GASTRIC BYPASS WITH UPPER ENDOSCOPY;  Surgeon: Berna Bue, MD;  Location: [facility];  Service: General;  Laterality: N/A;   KNEE ARTHROSCOPY WITH MEDIAL MENISECTOMY Right 09/10/2021   Procedure: KNEE ARTHROSCOPY WITH MEDIAL MENISCAL ROOT REPAIR;  Surgeon: Bjorn Pippin, MD;  Location: [facility];  Service: Orthopedics;  Laterality: Right;   PILONIDAL CYST EXCISION  1990's   RADIOLOGY WITH ANESTHESIA N/A 11/10/2018   Procedure: MRI WITH ANESTHESIA OF BRAIN AND ORBITS WITH AND WITHOUT CONTRAST;  Surgeon: Radiologist, Medication, MD;  Location: [facility] OR;  Service: Radiology;  Laterality: N/A;   TENDON REPAIR Left 2011   Left Ankle   TOTAL LAPAROSCOPIC HYSTERECTOMY WITH SALPINGECTOMY Bilateral 02/04/2021   Procedure: TOTAL LAPAROSCOPIC HYSTERECTOMY WITH  SALPINGECTOMY;  Surgeon: Romualdo Bolk, MD;  Location[facility] OR;  Service: Gynecology;  Laterality: Bilateral;   UPPER GASTROINTESTINAL ENDOSCOPY     WISDOM TOOTH EXTRACTION  1990's    Allergies  Allergies  Allergen Reactions   Bacitracin Rash   Nsaids    Penicillins Shortness Of Breath and Rash   Tamiflu [Oseltamivir Phosphate] Anaphylaxis   Cephalosporins Rash   Latex Rash    Home Medications    Prior to Admission medications   Medication Sig Start Date End Date Taking? Authorizing Provider  Biotin 5 MG TABS Take by mouth.    [provider]  cetirizine (ZYRTEC) 10 MG tablet Take 10 mg by mouth at bedtime.    [provider]  Cholecalciferol (VITAMIN D3) 5000 units CAPS Take 5,000 Units by mouth at bedtime.    [provider]  clonazePAM (KLONOPIN) 0.5 MG tablet Take 1 tablet (0.5 mg) by mouth every 6 hours as needed for anxiety.  (up to 4 tablets daily) 09/24/23   Claybon Jabs, Rosey Bath T, PA-C  estradiol (VIVELLE-DOT) 0.025 MG/24HR Place 1 patch onto the skin 2 (two) times a week. 11/04/23   Earley Favor, MD  fluvoxaMINE (LUVOX) 100 MG tablet Take 1 tablet by mouth at bedtime. Take with 50 mg dose to equal 150 mg at bedtime 09/24/23   Melony Overly T, PA-C  fluvoxaMINE (LUVOX) 50 MG tablet TAKE 1 TABLET BY MOUTH AT BEDTIME. TAKE IN ADDITION TO THE 100 MG TABLET TO EQUAL 150 MG TOTAL 09/24/23   Hurst, Rosey Bath T, PA-C  gabapentin (NEURONTIN) 300 MG capsule Take 3 capsules (900 mg total) by mouth at bedtime. 07/14/23   Melony Overly T, PA-C  Multiple Vitamin (MULTIVITAMIN) capsule Take 1 capsule by mouth daily. bariatric    [provider]  ondansetron (ZOFRAN-ODT) 8 MG disintegrating tablet Dissolve 1 tablet by mouth every 8 hours as needed for nausea. 11/02/23   Tollie Eth, NP  pantoprazole (PROTONIX) 40 MG tablet Take 1 tablet (40 mg total) by mouth daily. 10/05/23     Prasterone (INTRAROSA) 6.5 MG INST Place 1 suppository vaginally at bedtime as  needed. 11/02/23   Earley Favor, MD  prazosin (MINIPRESS) 5 MG capsule Take 2 capsules (10 mg total) by mouth at bedtime. 09/16/23   Cherie Ouch, PA-C  propranolol (INDERAL) 40 MG tablet Take 1 tablet by mouth twice a day --May take an extra tablet in evening if needed Patient taking differently: Take 40 mg by mouth See admin instructions. Take 40 mg (1 tablet) by nightly in addition to 40 mg (1 tablet) as needed during the day. 02/17/23   Tollie Eth, NP  QUEtiapine (SEROQUEL) 200 MG tablet Take 2 tablets (400 mg total) by mouth at bedtime. 06/25/23   Melony Overly T, PA-C  triamcinolone (NASACORT) 55 MCG/ACT AERO nasal inhaler Place 1 spray into the nose at bedtime.    [provider]  UNABLE TO FIND Take 1 tablet by mouth at bedtime. [place]te[place]    [provider]  Physical Exam    Vital Signs:  Judyann G Dorian does not have vital signs available for review today.  Given telephonic nature of communication, physical exam is limited. AAOx3. NAD. Normal affect.  Speech and respirations are unlabored.  Accessory Clinical Findings    None  Assessment & Plan    1.  Preoperative Cardiovascular Risk Assessment: According to the Revised Cardiac Risk Index (RCRI), her Perioperative Risk of Major Cardiac Event is (%): 0.4. Her Functional Capacity in METs is: 7.68 according to the Duke Activity Status Index (DASI). Therefore, based on ACC/AHA guidelines, patient would be at acceptable risk for the planned procedure without further cardiovascular testing.  The patient was advised that if she develops new symptoms prior to surgery to contact our office to arrange for a follow-up visit, and she verbalized understanding.  Patient not currently taking any anticoagulants or antiplatelet therapy.   A copy of this note will be routed to requesting surgeon.  Time:   Today, I have spent 6 minutes with the patient with telehealth technology discussing medical history, symptoms,  and management plan.     Denyce Robert, NP  12/20/2023, 9:25 AM

## 2023-12-21 ENCOUNTER — Other Ambulatory Visit: Payer: Self-pay | Admitting: Physician Assistant

## 2023-12-21 ENCOUNTER — Other Ambulatory Visit (HOSPITAL_COMMUNITY): Payer: Self-pay

## 2023-12-21 ENCOUNTER — Other Ambulatory Visit: Payer: Self-pay

## 2023-12-21 DIAGNOSIS — F4310 Post-traumatic stress disorder, unspecified: Secondary | ICD-10-CM | POA: Diagnosis not present

## 2023-12-21 MED ORDER — PANTOPRAZOLE SODIUM 40 MG PO TBEC
DELAYED_RELEASE_TABLET | ORAL | 0 refills | Status: DC
  Filled 2023-12-21: qty 90, 90d supply, fill #0

## 2023-12-22 ENCOUNTER — Other Ambulatory Visit (HOSPITAL_COMMUNITY): Payer: Self-pay

## 2023-12-22 ENCOUNTER — Other Ambulatory Visit: Payer: Self-pay

## 2023-12-22 MED ORDER — QUETIAPINE FUMARATE 200 MG PO TABS
400.0000 mg | ORAL_TABLET | Freq: Every day | ORAL | 0 refills | Status: DC
  Filled 2023-12-22: qty 180, 90d supply, fill #0

## 2023-12-29 ENCOUNTER — Other Ambulatory Visit (HOSPITAL_COMMUNITY): Payer: Self-pay

## 2023-12-29 MED ORDER — HYDROCODONE-ACETAMINOPHEN 5-325 MG PO TABS
1.0000 | ORAL_TABLET | Freq: Four times a day (QID) | ORAL | 0 refills | Status: DC | PRN
  Filled 2023-12-29: qty 20, 5d supply, fill #0

## 2024-01-04 ENCOUNTER — Other Ambulatory Visit (HOSPITAL_COMMUNITY): Payer: Self-pay

## 2024-01-04 MED ORDER — TIZANIDINE HCL 2 MG PO TABS
2.0000 mg | ORAL_TABLET | Freq: Three times a day (TID) | ORAL | 0 refills | Status: DC
  Filled 2024-01-04: qty 20, 7d supply, fill #0

## 2024-01-05 ENCOUNTER — Ambulatory Visit: Payer: Self-pay | Attending: Orthopedic Surgery | Admitting: Physical Therapy

## 2024-01-05 ENCOUNTER — Other Ambulatory Visit: Payer: Self-pay

## 2024-01-05 ENCOUNTER — Encounter: Payer: Self-pay | Admitting: Physical Therapy

## 2024-01-05 DIAGNOSIS — R2689 Other abnormalities of gait and mobility: Secondary | ICD-10-CM | POA: Diagnosis not present

## 2024-01-05 DIAGNOSIS — M6281 Muscle weakness (generalized): Secondary | ICD-10-CM | POA: Insufficient documentation

## 2024-01-05 DIAGNOSIS — M25561 Pain in right knee: Secondary | ICD-10-CM | POA: Diagnosis not present

## 2024-01-05 DIAGNOSIS — R600 Localized edema: Secondary | ICD-10-CM | POA: Diagnosis not present

## 2024-01-05 NOTE — Therapy (Signed)
 OUTPATIENT PHYSICAL THERAPY LOWER EXTREMITY EVALUATION   Patient Name: Caroline Gonzales MRN: [id number] DOB:March 31, 1976, 48 y.o., female Today's Date: 01/05/2024  END OF SESSION:  PT End of Session - 01/05/24 1024     Visit Number 1    Number of Visits 9    Date for PT Re-Evaluation 03/15/24    Authorization Type WC    Authorization - Visit Number 1    Authorization - Number of Visits 9    PT Start Time 1019    PT Stop Time 1108    PT Time Calculation (min) 49 min    Activity Tolerance Patient tolerated treatment well    Behavior During Therapy [facility] for tasks assessed/performed             Past Medical History:  Diagnosis Date   Acute costochondritis 11/14/2021   Allergy    Zyrtec .   Anxiety    followed by Dr. Abel Abelson   Back pain    Complication of anesthesia    PONV   Depression    Fibroid    Gastroparesis 09/14/2012   gastric emptying study in 2014   GERD (gastroesophageal reflux disease)    Headache    Hypertriglyceridemia 10/07/2017   Nightmares 07/21/2018   Pneumonia    2013ish   PONV (postoperative nausea and vomiting)     likes scopolamine  patch and zofran  /phenergan  helps   Pre-diabetes    PTSD (post-traumatic stress disorder)    Residual foreign body in soft tissue 11/12/2016   Screening for colon cancer 07/02/2022   Sleep apnea 07/01/2018   cpap optional, pt close to not use cpap   Vitamin D  deficiency    Wears glasses    Past Surgical History:  Procedure Laterality Date   ABDOMINAL HYSTERECTOMY  01/2021   ANKLE ARTHROSCOPY Left 2011   CESAREAN SECTION  06/2006   x 1   CHOLECYSTECTOMY  07/2006   laparoscopic   CYSTOSCOPY N/A 02/04/2021   Procedure: CYSTOSCOPY;  Surgeon: Wanita Gutta, MD;  Location: [facility] OR;  Service: Gynecology;  Laterality: N/A;   DILATION AND CURETTAGE OF UTERUS  1997   x 2   FOREIGN BODY REMOVAL Left 11/18/2016   Procedure: REMOVAL FOREIGN BODY EXTREMITY LEFT FOOT;  Surgeon: Charity Conch, DPM;  Location: [facility]  OR;  Service: Podiatry;  Laterality: Left;   GASTRIC ROUX-EN-Y N/A 04/13/2023   Procedure: LAPAROSCOPIC ROUX-EN-Y GASTRIC BYPASS WITH UPPER ENDOSCOPY;  Surgeon: Adalberto Acton, MD;  Location: [facility];  Service: General;  Laterality: N/A;   KNEE ARTHROSCOPY WITH MEDIAL MENISECTOMY Right 09/10/2021   Procedure: KNEE ARTHROSCOPY WITH MEDIAL MENISCAL ROOT REPAIR;  Surgeon: Micheline Ahr, MD;  Location: [facility];  Service: Orthopedics;  Laterality: Right;   PILONIDAL CYST EXCISION  1990's   RADIOLOGY WITH ANESTHESIA N/A 11/10/2018   Procedure: MRI WITH ANESTHESIA OF BRAIN AND ORBITS WITH AND WITHOUT CONTRAST;  Surgeon: Radiologist, Medication, MD;  Location: [facility] OR;  Service: Radiology;  Laterality: N/A;   TENDON REPAIR Left 2011   Left Ankle   TOTAL LAPAROSCOPIC HYSTERECTOMY WITH SALPINGECTOMY Bilateral 02/04/2021   Procedure: TOTAL LAPAROSCOPIC HYSTERECTOMY WITH SALPINGECTOMY;  Surgeon: Wanita Gutta, MD;  Location: [facility] OR;  Service: Gynecology;  Laterality: Bilateral;   UPPER GASTROINTESTINAL ENDOSCOPY     WISDOM TOOTH EXTRACTION  1990's   Patient Active Problem List   Diagnosis Date Noted   Family history of elevated lipoprotein (a) 04/26/2023   PVC's (premature ventricular contractions) 04/26/2023   Morbid  obesity (HCC) 04/13/2023   Gastroesophageal reflux disease 12/24/2022   Abnormal laboratory test 12/24/2022   Chest wall pain 12/24/2022   Elevated blood pressure reading without diagnosis of hypertension 12/29/2021   Change in facial mole 12/29/2021   S/P laparoscopic hysterectomy 02/04/2021   PTSD (post-traumatic stress disorder) 07/21/2018   GAD (generalized anxiety disorder) 07/21/2018   Insomnia 07/21/2018   Recurrent major depressive disorder, in partial remission (HCC) 07/21/2018   Insulin  resistance 10/21/2017   Hypertriglyceridemia 10/07/2017   Vitamin D  deficiency 10/07/2017   Pre-diabetes 11/02/2016   Morbid obesity with body mass index (BMI) of  50.0 to 59.9 in adult [facility]) 10/15/2016    PCP: Annella Kief, NP  REFERRING PROVIDER: Neil Balls, MD   REFERRING DIAG: S/P R knee Scope  THERAPY DIAG:  Right knee pain, unspecified chronicity  Muscle weakness (generalized)  Other abnormalities of gait and mobility  Localized edema  Rationale for Evaluation and Treatment: Rehabilitation  ONSET DATE: 12/29/2023  SUBJECTIVE:   SUBJECTIVE STATEMENT: Patient reports overall everything is going well. She reports pain in the knee is 1-2 in sitting, and th eback is about a 3-4/10 which started the day of surgery which she notes is likely related positioining. She reports since the surgery it is improving. Denies any popping/ clicking, she notes the knee has buckled a few times but no falls.   PERTINENT HISTORY: See PMHx PAIN:  Are you having pain? Yes: NPRS scale: 1-2/10 current, At worst 6-7/10 Pain location: inside of the knee Pain description: Stabbing, slicing Aggravating factors: House work, walking too much, bending the knee Relieving factors: Stop doing the activity   PRECAUTIONS: None  RED FLAGS: None   WEIGHT BEARING RESTRICTIONS: No  FALLS:  Has patient fallen in last 6 months? 1 fall that caused this in November  LIVING ENVIRONMENT: Lives with: lives with their family Lives in: House/apartment Stairs: Yes: External: 2 steps; none Has following equipment at home: Single point cane, Walker - 2 wheeled, Crutches, and shower chair  OCCUPATION: RN in oncology   PLOF: Independent  PATIENT GOALS: to be able to do house work, and job (lifting machines/ wraps). Get rid of the pain.   OBJECTIVE:  Note: Objective measures were completed at Evaluation unless otherwise noted.  DIAGNOSTIC FINDINGS:  11/29/23 MRI R knee WO contrast IMPRESSION: 1. Postsurgical changes from interval medial meniscus posterior horn repair near the root. New small oblique horizontal tear of the posterior horn medial to the site of  repair. Increased extrusion of the body. 2. Early medial and patellofemoral compartment osteoarthritis.  PATIENT SURVEYS:  LEFS 35/80  COGNITION: Overall cognitive status: Within functional limits for tasks assessed     SENSATION: Not tested   POSTURE: rounded shoulders and forward head  PALPATION: TTP along the medial aspect of the patella and medial tibiofemoral joint line.   LOWER EXTREMITY ROM:  Active ROM Right eval Left eval  Hip flexion    Hip extension    Hip abduction    Hip adduction    Hip internal rotation    Hip external rotation    Knee flexion 125 P! 132  Knee extension 3   Ankle dorsiflexion    Ankle plantarflexion    Ankle inversion    Ankle eversion     (Blank rows = not tested)  LOWER EXTREMITY MMT:  MMT Right eval Left eval  Hip flexion 4-/5 4/5  Hip extension    Hip abduction 4/5 4/5  Hip adduction  Hip internal rotation    Hip external rotation    Knee flexion 4/5 P! 5/5  Knee extension 4/5 P! 5/5  Ankle dorsiflexion    Ankle plantarflexion    Ankle inversion    Ankle eversion     (Blank rows = not tested, P!= pain)  FUNCTIONAL TESTS:    GAIT: Distance walked: from waiting area to tx room Assistive device utilized: None Level of assistance: Complete Independence Comments: foot flat gait pattern with antalgic gait pattern                                                                                                                                 TREATMENT DATE:  [facility] Adult PT Treatment:                                                DATE: 01/05/2024 MTPR along the R VMO IASTM along the patellar/ quad tendion and VMO LAQ 1 x 12, with tibial IR to promote vastus lateralis activation Sit to stand 2 x 10, demonstration for nose over toes Standing calf stretch x 30 sec Heel strike/ toe off gait training/ education Provided initial HEP    PATIENT EDUCATION:  Education details: evaluation findings, POC, goals, HEP with  proper form/ rationale Person educated: Patient Education method: Explanation, Demonstration, Verbal cues, and Handouts Education comprehension: verbalized understanding  HOME EXERCISE PROGRAM: Access Code: [email or web address] URL: [email or web address] Date: 01/05/2024 Prepared by: Laron Plummer  Exercises - Seated Long Arc Quad  - 1 x daily - 7 x weekly - 2 sets - 10 - 15 reps - Sit to Stand with Pelvic Floor Contraction  - 1 x daily - 7 x weekly - 2 sets - 10 reps - Gastroc Stretch on Wall  - 1 x daily - 7 x weekly - 2 sets - 2 reps - 30 - 60 seconds hold - Seated Hamstring Stretch  - 1 x daily - 7 x weekly - 2 sets - 2 reps - 30 - 60 seconds  hold - Seated Hip Abduction  - 1 x daily - 7 x weekly - 2 sets - 10 - 15 reps  ASSESSMENT:  CLINICAL IMPRESSION: Patient is a 48 y.o. F who was seen today for physical therapy evaluation and treatment for s/p   knee arthroscopy on 12/29/2023. She has functional knee ROM with total arc at 3-125 degrees with pain noted at end ranges, and pain noted with MMT, and weakness noted compared bil which is expected. She has an antalgic gait pattern, and notes pain at the medial aspect of the knee/ patella. Additionally reports R low back pain which started the day of surgery and noted could be related to the positioning in surgery. She responded well with exercises in session requiring verbal cues/ demonstration for proper  form/ technique. She would benefit from physical therapy to decrease R knee pain, maximize RLE strengthf or gait and balance, and improve her overall function by addressing the deficits listed.   OBJECTIVE IMPAIRMENTS: Abnormal gait, decreased activity tolerance, decreased balance, decreased strength, increased edema, obesity, and pain.   ACTIVITY LIMITATIONS: lifting, bending, sitting, standing, squatting, stairs, and locomotion level  PARTICIPATION LIMITATIONS: shopping, community activity, occupation, and yard work  PERSONAL  FACTORS: Time since onset of injury/illness/exacerbation and 1-2 comorbidities: hx of anxiety/ depression, multiple knee surgries  are also affecting patient's functional outcome.   REHAB POTENTIAL: Excellent  CLINICAL DECISION MAKING: Evolving/moderate complexity  EVALUATION COMPLEXITY: Moderate   GOALS: Goals reviewed with patient? Yes  SHORT TERM GOALS: Target date: 02/02/2024  Pt to be IND with initial HEP for therapeutic progression Baseline: Goal status: INITIAL  2.  Pt to verbalize/ demo efficient heel strike/ toe off pattern to reduce strain/ tension and maximize energy conservation Baseline:  Goal status: INITIAL  3.  Pt to LEFS by >/= 10 points to demo improving function Baseline:  Goal status: INITIAL  4.  Increase Quad strength to >/= 4/ 5 with no pain during testing for strength improvement Baseline:  Goal status: INITIAL   LONG TERM GOALS: Target date: 03/01/2024  Increase RLE strength to >/= 4+/5 to promote knee/ patellar stability with WB activities Baseline:  Goal status: INITIAL  2.  Pt to be able to sit, stand or walk for >/= 60 min with no report of limitations andmax pain to </= 2/10 for functional endurance required for work related tasks.  Baseline:  Goal status: INITIAL  3.  Pt to be able to lift/ lower >/= 30# from the floor with </= 2/10 max pain for job specific requirements, and personal goals of performing house work with no limitations Baseline:  Goal status: INITIAL  4.  Pt to improve LEFS score to >/= 55/80 to demonstrate improvement in function Baseline:  Goal status: INITIAL  5.  Pt to be IND with all HEP and is able to maintain and progress current LOF IND Baseline:  Goal status: INITIAL    PLAN:  PT FREQUENCY: 1-2x/week  PT DURATION: 8 weeks  PLANNED INTERVENTIONS: 97110-Therapeutic exercises, 97530- Therapeutic activity, [id number]- Neuromuscular re-education, 97535- Self Care, 16109- Manual therapy, [id number]- Gait training,  97016- Vasopneumatic device, [id number]- Ultrasound, 60454- Ionotophoresis 4mg /ml Dexamethasone , Patient/Family education, Taping, Dry Needling, Joint mobilization, Cryotherapy, and Moist heat  PLAN FOR NEXT SESSION: Review/ update HEP PRN. Patellar mob, gross knee/ hip strength. Gait training, vaso for pain / swelling PRN. Limited tolerance with laying supine due to R low back pain.  Send notes to [facility] Case manager.   Brenton Joines PT, DPT, LAT, ATC  01/05/24  11:54 AM

## 2024-01-06 DIAGNOSIS — F4310 Post-traumatic stress disorder, unspecified: Secondary | ICD-10-CM | POA: Diagnosis not present

## 2024-01-10 ENCOUNTER — Ambulatory Visit: Attending: Nurse Practitioner | Admitting: Physical Therapy

## 2024-01-10 DIAGNOSIS — M25561 Pain in right knee: Secondary | ICD-10-CM | POA: Diagnosis present

## 2024-01-10 DIAGNOSIS — R2689 Other abnormalities of gait and mobility: Secondary | ICD-10-CM | POA: Diagnosis present

## 2024-01-10 DIAGNOSIS — M6281 Muscle weakness (generalized): Secondary | ICD-10-CM | POA: Insufficient documentation

## 2024-01-10 NOTE — Therapy (Signed)
 OUTPATIENT PHYSICAL THERAPY LOWER EXTREMITY EVALUATION   Patient Name: Caroline Gonzales MRN: [id number] DOB:02-14-1976, 48 y.o., female Today's Date: 01/10/2024  END OF SESSION:  PT End of Session - 01/10/24 1043     Visit Number 2    Number of Visits 9    Date for PT Re-Evaluation 03/15/24    Authorization Type WC    Authorization - Visit Number 2    Authorization - Number of Visits 9    PT Start Time 1018    PT Stop Time 1102    PT Time Calculation (min) 44 min              Past Medical History:  Diagnosis Date   Acute costochondritis 11/14/2021   Allergy    Zyrtec .   Anxiety    followed by Dr. Abel Abelson   Back pain    Complication of anesthesia    PONV   Depression    Fibroid    Gastroparesis 09/14/2012   gastric emptying study in 2014   GERD (gastroesophageal reflux disease)    Headache    Hypertriglyceridemia 10/07/2017   Nightmares 07/21/2018   Pneumonia    2013ish   PONV (postoperative nausea and vomiting)     likes scopolamine  patch and zofran  /phenergan  helps   Pre-diabetes    PTSD (post-traumatic stress disorder)    Residual foreign body in soft tissue 11/12/2016   Screening for colon cancer 07/02/2022   Sleep apnea 07/01/2018   cpap optional, pt close to not use cpap   Vitamin D  deficiency    Wears glasses    Past Surgical History:  Procedure Laterality Date   ABDOMINAL HYSTERECTOMY  01/2021   ANKLE ARTHROSCOPY Left 2011   CESAREAN SECTION  06/2006   x 1   CHOLECYSTECTOMY  07/2006   laparoscopic   CYSTOSCOPY N/A 02/04/2021   Procedure: CYSTOSCOPY;  Surgeon: Wanita Gutta, MD;  Location: [facility] OR;  Service: Gynecology;  Laterality: N/A;   DILATION AND CURETTAGE OF UTERUS  1997   x 2   FOREIGN BODY REMOVAL Left 11/18/2016   Procedure: REMOVAL FOREIGN BODY EXTREMITY LEFT FOOT;  Surgeon: Charity Conch, DPM;  Location: [facility] OR;  Service: Podiatry;  Laterality: Left;   GASTRIC ROUX-EN-Y N/A 04/13/2023   Procedure: LAPAROSCOPIC  ROUX-EN-Y GASTRIC BYPASS WITH UPPER ENDOSCOPY;  Surgeon: Adalberto Acton, MD;  Location: [facility] ORS;  Service: General;  Laterality: N/A;   KNEE ARTHROSCOPY WITH MEDIAL MENISECTOMY Right 09/10/2021   Procedure: KNEE ARTHROSCOPY WITH MEDIAL MENISCAL ROOT REPAIR;  Surgeon: Micheline Ahr, MD;  Location: [facility];  Service: Orthopedics;  Laterality: Right;   PILONIDAL CYST EXCISION  1990's   RADIOLOGY WITH ANESTHESIA N/A 11/10/2018   Procedure: MRI WITH ANESTHESIA OF BRAIN AND ORBITS WITH AND WITHOUT CONTRAST;  Surgeon: Radiologist, Medication, MD;  Location: [facility] OR;  Service: Radiology;  Laterality: N/A;   TENDON REPAIR Left 2011   Left Ankle   TOTAL LAPAROSCOPIC HYSTERECTOMY WITH SALPINGECTOMY Bilateral 02/04/2021   Procedure: TOTAL LAPAROSCOPIC HYSTERECTOMY WITH SALPINGECTOMY;  Surgeon: Wanita Gutta, MD;  Location: [facility] OR;  Service: Gynecology;  Laterality: Bilateral;   UPPER GASTROINTESTINAL ENDOSCOPY     WISDOM TOOTH EXTRACTION  1990's   Patient Active Problem List   Diagnosis Date Noted   Family history of elevated lipoprotein (a) 04/26/2023   PVC's (premature ventricular contractions) 04/26/2023   Morbid obesity (HCC) 04/13/2023   Gastroesophageal reflux disease 12/24/2022   Abnormal laboratory test 12/24/2022   Chest  wall pain 12/24/2022   Elevated blood pressure reading without diagnosis of hypertension 12/29/2021   Change in facial mole 12/29/2021   S/P laparoscopic hysterectomy 02/04/2021   PTSD (post-traumatic stress disorder) 07/21/2018   GAD (generalized anxiety disorder) 07/21/2018   Insomnia 07/21/2018   Recurrent major depressive disorder, in partial remission (HCC) 07/21/2018   Insulin  resistance 10/21/2017   Hypertriglyceridemia 10/07/2017   Vitamin D  deficiency 10/07/2017   Pre-diabetes 11/02/2016   Morbid obesity with body mass index (BMI) of 50.0 to 59.9 in adult [facility]) 10/15/2016    PCP: Annella Kief, NP  REFERRING PROVIDER: Neil Balls,  MD   REFERRING DIAG: S/P R knee Scope  THERAPY DIAG:  Right knee pain, unspecified chronicity  Muscle weakness (generalized)  Other abnormalities of gait and mobility  Rationale for Evaluation and Treatment: Rehabilitation  ONSET DATE: 12/29/2023  SUBJECTIVE:   SUBJECTIVE STATEMENT: 01/10/2024"Exercises are helping , but I am doing them and it helps but I still feel a little sore."  PERTINENT HISTORY: See PMHx PAIN:  Are you having pain? Yes: NPRS scale: 1-2/10 current, Pain location: inside of the knee Pain description: Stabbing, slicing Aggravating factors: House work, walking too much, bending the knee Relieving factors: Stop doing the activity   PRECAUTIONS: None  RED FLAGS: None   WEIGHT BEARING RESTRICTIONS: No  FALLS:  Has patient fallen in last 6 months? 1 fall that caused this in November  LIVING ENVIRONMENT: Lives with: lives with their family Lives in: House/apartment Stairs: Yes: External: 2 steps; none Has following equipment at home: Single point cane, Walker - 2 wheeled, Crutches, and shower chair  OCCUPATION: RN in oncology   PLOF: Independent  PATIENT GOALS: to be able to do house work, and job (lifting machines/ wraps). Get rid of the pain.   OBJECTIVE:  Note: Objective measures were completed at Evaluation unless otherwise noted.  DIAGNOSTIC FINDINGS:  11/29/23 MRI R knee WO contrast IMPRESSION: 1. Postsurgical changes from interval medial meniscus posterior horn repair near the root. New small oblique horizontal tear of the posterior horn medial to the site of repair. Increased extrusion of the body. 2. Early medial and patellofemoral compartment osteoarthritis.  PATIENT SURVEYS:  LEFS 35/80  COGNITION: Overall cognitive status: Within functional limits for tasks assessed     SENSATION: Not tested   POSTURE: rounded shoulders and forward head  PALPATION: TTP along the medial aspect of the patella and medial tibiofemoral  joint line.   LOWER EXTREMITY ROM:  Active ROM Right eval Left eval  Hip flexion    Hip extension    Hip abduction    Hip adduction    Hip internal rotation    Hip external rotation    Knee flexion 125 P! 132  Knee extension 3   Ankle dorsiflexion    Ankle plantarflexion    Ankle inversion    Ankle eversion     (Blank rows = not tested)  LOWER EXTREMITY MMT:  MMT Right eval Left eval  Hip flexion 4-/5 4/5  Hip extension    Hip abduction 4/5 4/5  Hip adduction    Hip internal rotation    Hip external rotation    Knee flexion 4/5 P! 5/5  Knee extension 4/5 P! 5/5  Ankle dorsiflexion    Ankle plantarflexion    Ankle inversion    Ankle eversion     (Blank rows = not tested, P!= pain)  FUNCTIONAL TESTS:    GAIT: Distance walked: from waiting area to tx room  Assistive device utilized: None Level of assistance: Complete Independence Comments: foot flat gait pattern with antalgic gait pattern                                                                                                                                 TREATMENT :  [facility] Adult PT Treatment:                                                DATE: 01/10/24 MTPR along the R rectus femoris IASTM along quad and patellar tendon and x friction massage Patellar mobs in all directions SAQ with quad set x 12 LAQ 1 x 10 Standing hip abduction 2 x 12 bil with RTB Standing TKE pushing ball against wall x 6 reps Updated HEP.    [facility] Adult PT Treatment:                                                DATE: 01/05/2024 MTPR along the R VMO IASTM along the patellar/ quad tendion and VMO LAQ 1 x 12, with tibial IR to promote vastus lateralis activation Sit to stand 2 x 10, demonstration for nose over toes Standing calf stretch x 30 sec Heel strike/ toe off gait training/ education Provided initial HEP    PATIENT EDUCATION:  Education details: evaluation findings, POC, goals, HEP with proper form/  rationale Person educated: Patient Education method: Explanation, Demonstration, Verbal cues, and Handouts Education comprehension: verbalized understanding  HOME EXERCISE PROGRAM: Access Code: [email or web address] URL: [email or web address] Date: 01/10/2024 Prepared by: Laron Plummer  Exercises - Seated Long Arc Quad  - 1 x daily - 7 x weekly - 2 sets - 10 - 15 reps - Sit to Stand with Pelvic Floor Contraction  - 1 x daily - 7 x weekly - 2 sets - 10 reps - Gastroc Stretch on Wall  - 1 x daily - 7 x weekly - 2 sets - 2 reps - 30 - 60 seconds hold - Seated Hamstring Stretch  - 1 x daily - 7 x weekly - 2 sets - 2 reps - 30 - 60 seconds  hold - Seated Hip Abduction  - 1 x daily - 7 x weekly - 2 sets - 10 - 15 reps - Standing Hip Abduction with Resistance at Ankles and Counter Support  - 1 x daily - 7 x weekly - 2 sets - 10 reps - Standing Terminal Knee Extension at Wall with Ball  - 1 x daily - 7 x weekly - 2 sets - 10 reps - 5-10 sec hold  ASSESSMENT:  CLINICAL IMPRESSION: 4/28/2025Mrs Crear arrives to PT noting improvement since the last session in  R knee. Still has some pain/ stiffness in the quad / patellar tendon. She responded well to MTPR techniques and IASTM. Continued working on gross RLE strength which she does well but does fatigue quickly. Updated HEP today.   Evaluation Patient is a 48 y.o. F who was seen today for physical therapy evaluation and treatment for s/p   knee arthroscopy on 12/29/2023. She has functional knee ROM with total arc at 3-125 degrees with pain noted at end ranges, and pain noted with MMT, and weakness noted compared bil which is expected. She has an antalgic gait pattern, and notes pain at the medial aspect of the knee/ patella. Additionally reports R low back pain which started the day of surgery and noted could be related to the positioning in surgery. She responded well with exercises in session requiring verbal cues/ demonstration for proper form/  technique. She would benefit from physical therapy to decrease R knee pain, maximize RLE strengthf or gait and balance, and improve her overall function by addressing the deficits listed.   OBJECTIVE IMPAIRMENTS: Abnormal gait, decreased activity tolerance, decreased balance, decreased strength, increased edema, obesity, and pain.   ACTIVITY LIMITATIONS: lifting, bending, sitting, standing, squatting, stairs, and locomotion level  PARTICIPATION LIMITATIONS: shopping, community activity, occupation, and yard work  PERSONAL FACTORS: Time since onset of injury/illness/exacerbation and 1-2 comorbidities: hx of anxiety/ depression, multiple knee surgries  are also affecting patient's functional outcome.   REHAB POTENTIAL: Excellent  CLINICAL DECISION MAKING: Evolving/moderate complexity  EVALUATION COMPLEXITY: Moderate   GOALS: Goals reviewed with patient? Yes  SHORT TERM GOALS: Target date: 02/02/2024  Pt to be IND with initial HEP for therapeutic progression Baseline: Goal status: INITIAL  2.  Pt to verbalize/ demo efficient heel strike/ toe off pattern to reduce strain/ tension and maximize energy conservation Baseline:  Goal status: INITIAL  3.  Pt to LEFS by >/= 10 points to demo improving function Baseline:  Goal status: INITIAL  4.  Increase Quad strength to >/= 4/ 5 with no pain during testing for strength improvement Baseline:  Goal status: INITIAL   LONG TERM GOALS: Target date: 03/01/2024  Increase RLE strength to >/= 4+/5 to promote knee/ patellar stability with WB activities Baseline:  Goal status: INITIAL  2.  Pt to be able to sit, stand or walk for >/= 60 min with no report of limitations andmax pain to </= 2/10 for functional endurance required for work related tasks.  Baseline:  Goal status: INITIAL  3.  Pt to be able to lift/ lower >/= 30# from the floor with </= 2/10 max pain for job specific requirements, and personal goals of performing house work with  no limitations Baseline:  Goal status: INITIAL  4.  Pt to improve LEFS score to >/= 55/80 to demonstrate improvement in function Baseline:  Goal status: INITIAL  5.  Pt to be IND with all HEP and is able to maintain and progress current LOF IND Baseline:  Goal status: INITIAL    PLAN:  PT FREQUENCY: 1-2x/week  PT DURATION: 8 weeks  PLANNED INTERVENTIONS: 97110-Therapeutic exercises, 97530- Therapeutic activity, [id number]- Neuromuscular re-education, 97535- Self Care, 16109- Manual therapy, [id number]- Gait training, 97016- Vasopneumatic device, [id number]- Ultrasound, 60454- Ionotophoresis 4mg /ml Dexamethasone , Patient/Family education, Taping, Dry Needling, Joint mobilization, Cryotherapy, and Moist heat  PLAN FOR NEXT SESSION: Review/ update HEP PRN. Patellar mob, gross knee/ hip strength. Gait training, vaso for pain / swelling PRN. Limited tolerance with laying supine due to R low back pain.  Send  notes to [facility] Case manager.   Tyric Rodeheaver PT, DPT, LAT, ATC  01/10/24  12:06 PM

## 2024-01-11 DIAGNOSIS — F4310 Post-traumatic stress disorder, unspecified: Secondary | ICD-10-CM | POA: Diagnosis not present

## 2024-01-14 ENCOUNTER — Ambulatory Visit: Admitting: Physical Therapy

## 2024-01-17 ENCOUNTER — Ambulatory Visit: Attending: Orthopedic Surgery | Admitting: Physical Therapy

## 2024-01-17 ENCOUNTER — Encounter: Payer: Self-pay | Admitting: Physical Therapy

## 2024-01-17 ENCOUNTER — Other Ambulatory Visit: Payer: Self-pay

## 2024-01-17 DIAGNOSIS — M6281 Muscle weakness (generalized): Secondary | ICD-10-CM | POA: Insufficient documentation

## 2024-01-17 DIAGNOSIS — M25561 Pain in right knee: Secondary | ICD-10-CM | POA: Diagnosis present

## 2024-01-17 NOTE — Therapy (Signed)
 OUTPATIENT PHYSICAL THERAPY LOWER EXTREMITY TREATMENT   Patient Name: Caroline Gonzales MRN: [id number] DOB:1976/02/18, 48 y.o., female Today's Date: 01/17/2024  END OF SESSION:  PT End of Session - 01/17/24 1105     Visit Number 3    Number of Visits 9    Date for PT Re-Evaluation 03/15/24    Authorization Type WC    Authorization - Visit Number 3    Authorization - Number of Visits 9    PT Start Time 1102    PT Stop Time 1140    PT Time Calculation (min) 38 min              Past Medical History:  Diagnosis Date   Acute costochondritis 11/14/2021   Allergy    Zyrtec .   Anxiety    followed by Dr. Abel Abelson   Back pain    Complication of anesthesia    PONV   Depression    Fibroid    Gastroparesis 09/14/2012   gastric emptying study in 2014   GERD (gastroesophageal reflux disease)    Headache    Hypertriglyceridemia 10/07/2017   Nightmares 07/21/2018   Pneumonia    2013ish   PONV (postoperative nausea and vomiting)     likes scopolamine  patch and zofran  /phenergan  helps   Pre-diabetes    PTSD (post-traumatic stress disorder)    Residual foreign body in soft tissue 11/12/2016   Screening for colon cancer 07/02/2022   Sleep apnea 07/01/2018   cpap optional, pt close to not use cpap   Vitamin D  deficiency    Wears glasses    Past Surgical History:  Procedure Laterality Date   ABDOMINAL HYSTERECTOMY  01/2021   ANKLE ARTHROSCOPY Left 2011   CESAREAN SECTION  06/2006   x 1   CHOLECYSTECTOMY  07/2006   laparoscopic   CYSTOSCOPY N/A 02/04/2021   Procedure: CYSTOSCOPY;  Surgeon: Wanita Gutta, MD;  Location: [facility] OR;  Service: Gynecology;  Laterality: N/A;   DILATION AND CURETTAGE OF UTERUS  1997   x 2   FOREIGN BODY REMOVAL Left 11/18/2016   Procedure: REMOVAL FOREIGN BODY EXTREMITY LEFT FOOT;  Surgeon: Charity Conch, DPM;  Location: [facility] OR;  Service: Podiatry;  Laterality: Left;   GASTRIC ROUX-EN-Y N/A 04/13/2023   Procedure: LAPAROSCOPIC ROUX-EN-Y  GASTRIC BYPASS WITH UPPER ENDOSCOPY;  Surgeon: Adalberto Acton, MD;  Location: [facility] ORS;  Service: General;  Laterality: N/A;   KNEE ARTHROSCOPY WITH MEDIAL MENISECTOMY Right 09/10/2021   Procedure: KNEE ARTHROSCOPY WITH MEDIAL MENISCAL ROOT REPAIR;  Surgeon: Micheline Ahr, MD;  Location: [facility];  Service: Orthopedics;  Laterality: Right;   PILONIDAL CYST EXCISION  1990's   RADIOLOGY WITH ANESTHESIA N/A 11/10/2018   Procedure: MRI WITH ANESTHESIA OF BRAIN AND ORBITS WITH AND WITHOUT CONTRAST;  Surgeon: Radiologist, Medication, MD;  Location: [facility] OR;  Service: Radiology;  Laterality: N/A;   TENDON REPAIR Left 2011   Left Ankle   TOTAL LAPAROSCOPIC HYSTERECTOMY WITH SALPINGECTOMY Bilateral 02/04/2021   Procedure: TOTAL LAPAROSCOPIC HYSTERECTOMY WITH SALPINGECTOMY;  Surgeon: Wanita Gutta, MD;  Location: [facility] OR;  Service: Gynecology;  Laterality: Bilateral;   UPPER GASTROINTESTINAL ENDOSCOPY     WISDOM TOOTH EXTRACTION  1990's   Patient Active Problem List   Diagnosis Date Noted   Family history of elevated lipoprotein (a) 04/26/2023   PVC's (premature ventricular contractions) 04/26/2023   Morbid obesity (HCC) 04/13/2023   Gastroesophageal reflux disease 12/24/2022   Abnormal laboratory test 12/24/2022   Chest  wall pain 12/24/2022   Elevated blood pressure reading without diagnosis of hypertension 12/29/2021   Change in facial mole 12/29/2021   S/P laparoscopic hysterectomy 02/04/2021   PTSD (post-traumatic stress disorder) 07/21/2018   GAD (generalized anxiety disorder) 07/21/2018   Insomnia 07/21/2018   Recurrent major depressive disorder, in partial remission (HCC) 07/21/2018   Insulin  resistance 10/21/2017   Hypertriglyceridemia 10/07/2017   Vitamin D  deficiency 10/07/2017   Pre-diabetes 11/02/2016   Morbid obesity with body mass index (BMI) of 50.0 to 59.9 in adult [facility]) 10/15/2016    PCP: Annella Kief, NP  REFERRING PROVIDER: Neil Balls, MD    REFERRING DIAG: S/P R knee Scope  THERAPY DIAG:  Right knee pain, unspecified chronicity  Muscle weakness (generalized)  Rationale for Evaluation and Treatment: Rehabilitation  ONSET DATE: 12/29/2023  SUBJECTIVE:   SUBJECTIVE STATEMENT: 01/17/2024 It was getting a lot better. But I snagged my foot on a rug and I might have twisted it again.   PERTINENT HISTORY: See PMHx PAIN:  Are you having pain? Yes: NPRS scale: 3/10 current, Pain location: inside of the knee Pain description: Stabbing, slicing Aggravating factors: House work, walking too much, bending the knee Relieving factors: Stop doing the activity   PRECAUTIONS: None  RED FLAGS: None   WEIGHT BEARING RESTRICTIONS: No  FALLS:  Has patient fallen in last 6 months? 1 fall that caused this in November  LIVING ENVIRONMENT: Lives with: lives with their family Lives in: House/apartment Stairs: Yes: External: 2 steps; none Has following equipment at home: Single point cane, Walker - 2 wheeled, Crutches, and shower chair  OCCUPATION: RN in oncology   PLOF: Independent  PATIENT GOALS: to be able to do house work, and job (lifting machines/ wraps). Get rid of the pain.   OBJECTIVE:  Note: Objective measures were completed at Evaluation unless otherwise noted.  DIAGNOSTIC FINDINGS:  11/29/23 MRI R knee WO contrast IMPRESSION: 1. Postsurgical changes from interval medial meniscus posterior horn repair near the root. New small oblique horizontal tear of the posterior horn medial to the site of repair. Increased extrusion of the body. 2. Early medial and patellofemoral compartment osteoarthritis.  PATIENT SURVEYS:  LEFS 35/80  COGNITION: Overall cognitive status: Within functional limits for tasks assessed     SENSATION: Not tested   POSTURE: rounded shoulders and forward head  PALPATION: TTP along the medial aspect of the patella and medial tibiofemoral joint line.   LOWER EXTREMITY ROM:  Active  ROM Right eval Left eval Right 01/17/24  Hip flexion     Hip extension     Hip abduction     Hip adduction     Hip internal rotation     Hip external rotation     Knee flexion 125 P! 132 130  Knee extension 3  0  Ankle dorsiflexion     Ankle plantarflexion     Ankle inversion     Ankle eversion      (Blank rows = not tested)  LOWER EXTREMITY MMT:  MMT Right eval Left eval  Hip flexion 4-/5 4/5  Hip extension    Hip abduction 4/5 4/5  Hip adduction    Hip internal rotation    Hip external rotation    Knee flexion 4/5 P! 5/5  Knee extension 4/5 P! 5/5  Ankle dorsiflexion    Ankle plantarflexion    Ankle inversion    Ankle eversion     (Blank rows = not tested, P!= pain)  FUNCTIONAL TESTS:  GAIT: Distance walked: from waiting area to tx room Assistive device utilized: None Level of assistance: Complete Independence Comments: foot flat gait pattern with antalgic gait pattern                                                                                                                                 TREATMENT :  [facility] Adult PT Treatment:                                                DATE: 01/17/24 Rec bike L1 x 3 minutes- increased soreness so discontinued SAQ x 15 right QS into towel 5 sec x 15, right SLR x 15 right Hip abduction side lying x 15 right Hip flexor stretch with strap EOM +HEP Supine h/s stretch with strap KT tape applied to inhibit patella tendon  Declined iontophoresis due to Gastric Bypass restrictions     [facility] Adult PT Treatment:                                                DATE: 01/10/24 MTPR along the R rectus femoris IASTM along quad and patellar tendon and x friction massage Patellar mobs in all directions SAQ with quad set x 12 LAQ 1 x 10 Standing hip abduction 2 x 12 bil with RTB Standing TKE pushing ball against wall x 6 reps Updated HEP.    [facility] Adult PT Treatment:                                                DATE:  01/05/2024 MTPR along the R VMO IASTM along the patellar/ quad tendion and VMO LAQ 1 x 12, with tibial IR to promote vastus lateralis activation Sit to stand 2 x 10, demonstration for nose over toes Standing calf stretch x 30 sec Heel strike/ toe off gait training/ education Provided initial HEP    PATIENT EDUCATION:  Education details: evaluation findings, POC, goals, HEP with proper form/ rationale Person educated: Patient Education method: Explanation, Demonstration, Verbal cues, and Handouts Education comprehension: verbalized understanding  HOME EXERCISE PROGRAM: Access Code: [email or web address] URL: [email or web address] Date: 01/10/2024 Prepared by: Laron Plummer  Exercises - Seated Long Arc Quad  - 1 x daily - 7 x weekly - 2 sets - 10 - 15 reps - Sit to Stand with Pelvic Floor Contraction  - 1 x daily - 7 x weekly - 2 sets - 10 reps - Gastroc Stretch on Wall  - 1 x daily - 7 x weekly - 2 sets -  2 reps - 30 - 60 seconds hold - Seated Hamstring Stretch  - 1 x daily - 7 x weekly - 2 sets - 2 reps - 30 - 60 seconds  hold - Seated Hip Abduction  - 1 x daily - 7 x weekly - 2 sets - 10 - 15 reps - Standing Hip Abduction with Resistance at Ankles and Counter Support  - 1 x daily - 7 x weekly - 2 sets - 10 reps - Standing Terminal Knee Extension at Wall with Ball  - 1 x daily - 7 x weekly - 2 sets - 10 reps - 5-10 sec hold  ASSESSMENT:  CLINICAL IMPRESSION: 01/17/2024 Caroline Gonzales arrives to PT reporting increased pain after catching her foot on a rug and her knee turning inward (valgus strain). She now has increased medial knee pain. Still has some pain/ stiffness in the quad / patellar tendon. She responded well trial of KT tape to address patella tendon tenderness and medial knee pain. Afterward, she reported decreased pain and felt more supported. Will continued with 1 x per week at this time to give her a chance to return to baseline. She was encouraged to continued HEP and  complete only exercises that do not increase pain. She was having difficulty fully extending knee at start of session but it did improve after quad activation exercises. Updated HEP with hip flexor/quad stretch.  She declined iontophoresis due to her restrictions regarding steroids s/p bypass surgery.   Evaluation Patient is a 48 y.o. F who was seen today for physical therapy evaluation and treatment for s/p   knee arthroscopy on 12/29/2023. She has functional knee ROM with total arc at 3-125 degrees with pain noted at end ranges, and pain noted with MMT, and weakness noted compared bil which is expected. She has an antalgic gait pattern, and notes pain at the medial aspect of the knee/ patella. Additionally reports R low back pain which started the day of surgery and noted could be related to the positioning in surgery. She responded well with exercises in session requiring verbal cues/ demonstration for proper form/ technique. She would benefit from physical therapy to decrease R knee pain, maximize RLE strengthf or gait and balance, and improve her overall function by addressing the deficits listed.   OBJECTIVE IMPAIRMENTS: Abnormal gait, decreased activity tolerance, decreased balance, decreased strength, increased edema, obesity, and pain.   ACTIVITY LIMITATIONS: lifting, bending, sitting, standing, squatting, stairs, and locomotion level  PARTICIPATION LIMITATIONS: shopping, community activity, occupation, and yard work  PERSONAL FACTORS: Time since onset of injury/illness/exacerbation and 1-2 comorbidities: hx of anxiety/ depression, multiple knee surgries  are also affecting patient's functional outcome.   REHAB POTENTIAL: Excellent  CLINICAL DECISION MAKING: Evolving/moderate complexity  EVALUATION COMPLEXITY: Moderate   GOALS: Goals reviewed with patient? Yes  SHORT TERM GOALS: Target date: 02/02/2024  Pt to be IND with initial HEP for therapeutic progression Baseline: Goal status:  INITIAL  2.  Pt to verbalize/ demo efficient heel strike/ toe off pattern to reduce strain/ tension and maximize energy conservation Baseline:  Goal status: INITIAL  3.  Pt to LEFS by >/= 10 points to demo improving function Baseline:  Goal status: INITIAL  4.  Increase Quad strength to >/= 4/ 5 with no pain during testing for strength improvement Baseline:  Goal status: INITIAL   LONG TERM GOALS: Target date: 03/01/2024  Increase RLE strength to >/= 4+/5 to promote knee/ patellar stability with WB activities Baseline:  Goal status: INITIAL  2.  Pt to be able to sit, stand or walk for >/= 60 min with no report of limitations andmax pain to </= 2/10 for functional endurance required for work related tasks.  Baseline:  Goal status: INITIAL  3.  Pt to be able to lift/ lower >/= 30# from the floor with </= 2/10 max pain for job specific requirements, and personal goals of performing house work with no limitations Baseline:  Goal status: INITIAL  4.  Pt to improve LEFS score to >/= 55/80 to demonstrate improvement in function Baseline:  Goal status: INITIAL  5.  Pt to be IND with all HEP and is able to maintain and progress current LOF IND Baseline:  Goal status: INITIAL    PLAN:  PT FREQUENCY: 1-2x/week  PT DURATION: 8 weeks  PLANNED INTERVENTIONS: 97110-Therapeutic exercises, 97530- Therapeutic activity, [id number]- Neuromuscular re-education, 97535- Self Care, 16109- Manual therapy, [id number]- Gait training, 97016- Vasopneumatic device, [id number]- Ultrasound, 60454- Ionotophoresis 4mg /ml Dexamethasone , Patient/Family education, Taping, Dry Needling, Joint mobilization, Cryotherapy, and Moist heat  PLAN FOR NEXT SESSION: Review/ update HEP PRN. Patellar mob, gross knee/ hip strength. Gait training, vaso for pain / swelling PRN. Limited tolerance with laying supine due to R low back pain.  Send notes to [facility] Case manager. NO Conway Dennis, PTA 01/17/24 11:41 AM Phone:  [phone] Fax: [phone]

## 2024-01-19 ENCOUNTER — Ambulatory Visit: Admitting: Physical Therapy

## 2024-01-24 ENCOUNTER — Encounter: Payer: Commercial Managed Care - PPO | Attending: Surgery | Admitting: Skilled Nursing Facility1

## 2024-01-24 ENCOUNTER — Encounter: Payer: Self-pay | Admitting: Skilled Nursing Facility1

## 2024-01-24 ENCOUNTER — Ambulatory Visit: Admitting: Physical Therapy

## 2024-01-24 VITALS — Ht 65.0 in | Wt 209.3 lb

## 2024-01-24 DIAGNOSIS — E669 Obesity, unspecified: Secondary | ICD-10-CM | POA: Insufficient documentation

## 2024-01-24 NOTE — Progress Notes (Signed)
 Bariatric Nutrition Follow-Up Visit Medical Nutrition Therapy  Appt Start Time: 1:58   End Time: 2:32  Surgery date: 04/13/2023 Surgery type: RYGB  NUTRITION ASSESSMENT  Anthropometrics  Start weight at [facility]: 266.5 lbs (date: 12/22/2022)  Height: 65 in Weight today: 209   Clinical   Pharmacotherapy: History of weight loss medication used: Wegovy    Medical hx: GERD, hypercholesterolemia, obesity, vit D deficiency Medications: see list Labs:  Notable signs/symptoms: none noted Any previous deficiencies? No Bowel Habits: Every day to every other day no complaints   Body Composition Scale 04/27/2023 07/15/2023 01/24/2024  Current Body Weight 246.3 223.0 209  Total Body Fat % 44.4 41.8 40.2  Visceral Fat 15 13 12   Fat-Free Mass % 55.5 58.1 59.7   Total Body Water  % 42.2 43.5 44.3  Muscle-Mass lbs 32.4 32.1 31.9  BMI 40.6 36.8 34.5  Body Fat Displacement            Torso  lbs 67.8 57.7 52.1         Left Leg  lbs 13.5 11.5 10.4         Right Leg  lbs 13.5 11.5 10.4         Left Arm  lbs 6.7 5.7 5.2         Right Arm  lbs 6.7 5.7 5.2     Lifestyle & Dietary Hx   Pt states she had a knee surgery from an injury and needed time for that to heal so had not been as active as she would have liked.  Pt states she has been doing PT.  Pt states her energy level has not been great.  Pt state she does recognize her non appetite cravings.  Pt states she does recognizes she does ea too fast.  Pt states if she eats 3 chips ahoy she will get dumping.  Pt states she does do therapy weekly which is going well.   Estimated daily fluid intake: 65-70 oz Estimated daily protein intake: average of 60 g Supplements: multivitamin and calcium Current average weekly physical activity: ADL's  24-Hr Dietary Recall First Meal: protein oatmeal + coconut sugar + berries + banana + nuts Snack: cheese Second Meal: salad or carb balance tortilla + Tukey + cheese + mustard  Snack: cheese Third  Meal: cereal or chicken + corn or broccoli or cauliflower   Snack: yasso bar Beverages: iced herbal peppermint tea, water   Post-Op Goals/ Signs/ Symptoms Using straws: no Drinking while eating: no Chewing/swallowing difficulties: no Changes in vision: no Changes to mood/headaches: no Hair loss/changes to skin/nails: no Difficulty focusing/concentrating: no Sweating: no Limb weakness: no Dizziness/lightheadedness: no Palpitations: no  Carbonated/caffeinated beverages: no N/V/D/C/Gas: some nausea when eating one more bit to fullness Abdominal pain: no Dumping syndrome: no    NUTRITION DIAGNOSIS  Overweight/obesity ([place]-3.3) related to past poor dietary habits and physical inactivity as evidenced by completed bariatric surgery and following dietary guidelines for continued weight loss and healthy nutrition status.     NUTRITION INTERVENTION Nutrition counseling (C-1) and education (E-2) to facilitate bariatric surgery goals, including: Creation of balanced and diverse meals to increase the intake of nutrient-rich foods that provide essential vitamins, minerals, fiber, and phytonutrients  Variety of Fruits and Vegetables:  Aim for a colorful array of fruits and vegetables to ensure a wide range of nutrients. Include a mix of leafy greens, berries, citrus fruits, cruciferous vegetables, and more. Whole Grains: Choose whole grains over refined grains. Examples include brown rice, quinoa, oats, whole wheat,  and barley. Lean Proteins: Include lean sources of protein, such as poultry, fish, tofu, legumes, beans, lentils, and low-fat dairy products. Limit red and processed meats. Healthy Fats: Incorporate sources of healthy fats, including avocados, nuts, seeds, and olive oil. Limit saturated and trans fats found in fried and processed foods. Dairy or Dairy Alternatives: Choose low-fat or fat-free dairy products, or plant-based alternatives like almond or soy milk. Portion  Control: Be mindful of portion sizes to avoid overeating. Pay attention to hunger and satisfaction cues. Limit Added Sugars: Minimize the consumption of sugary beverages, snacks, and desserts. Check food labels for added sugars and opt for natural sources of sweetness such as whole fruits. Hydration: Drink plenty of water  throughout the day. Limit sugary drinks and excessive caffeine intake. Moderate Sodium Intake: Reduce the consumption of high-sodium foods. Use herbs and spices for flavor instead of excessive salt. Meal Planning and Preparation: Plan and prepare meals ahead of time to make healthier choices more convenient. Include a mix of food groups in each meal. Limit Processed Foods: Minimize the intake of highly processed and packaged foods that are often high in added sugars, salt, and unhealthy fats. Regular Physical Activity: Combine a healthy diet with regular physical activity for overall well-being. Aim for at least 150 minutes of moderate-intensity aerobic exercise per week, along with strength training. Moderation and Balance: Enjoy treats and indulgent foods in moderation, emphasizing balance rather than strict restriction.  Goals Continue your work on your relationship with food, you are doing amazing! Use the mindful meals sheet and should I eat sheet to assist in unpacking your relationship with food All foods can be apart of the food plan  Handouts Previously Provided Include  Standard Prep Plan Advancement Guide  Learning Style & Readiness for Change Teaching method utilized: Visual & Auditory  Demonstrated degree of understanding via: Teach Back  Readiness Level: action Barriers to learning/adherence to lifestyle change: emotional eating   RD's Notes for Next Visit Assess adherence to pt chosen goals  MONITORING & EVALUATION Dietary intake, weekly physical activity, body weight.  Next Steps Patient is to follow-up in 2-3 months

## 2024-01-25 DIAGNOSIS — F4310 Post-traumatic stress disorder, unspecified: Secondary | ICD-10-CM | POA: Diagnosis not present

## 2024-01-26 ENCOUNTER — Encounter: Admitting: Physical Therapy

## 2024-01-27 ENCOUNTER — Telehealth (HOSPITAL_BASED_OUTPATIENT_CLINIC_OR_DEPARTMENT_OTHER): Payer: Self-pay | Admitting: Cardiology

## 2024-01-27 NOTE — Telephone Encounter (Signed)
  Per MyChart scheduling message:  Patient c/o Palpitations: STAT if patient c/o lightheadedness, shortness of breath, or chest pain  How long have you had palpitations/irregular HR/ Afib? Are you having the symptoms now?   Are you currently experiencing lightheadedness, SOB or CP?   Do you have a history of afib (atrial fibrillation) or irregular heart rhythm?   Have you checked your BP or HR? (document readings if available):   Are you experiencing any other symptoms?    1. Initially about 18 mos ago, and I wore a monitor for 3 days. I was good until In March 2025 when I had an episode that brought me to the ED n [place]. I was having a lot of PVCs but had converted to NSR by the time I got to Ed (I had taken clonazepam  and 40mg  propranolol ). I was also deserting to 80s with ambulation. I am currently having intermittent PVCs.  2. I am not symptomatic at this moment. It comes and goes during the day.  3. I do have that history and have seen Dr Veryl Gottron before.  4. I checked my BP, and I run 90-120 sbp and 50-75 dbp. Resting heart rate is 50 (I get symptomatic when it's below 50) to 70, but sometimes by resting hr jumps to 90, and I get symptomatic then too.  5. I am having intermittent SOB, CP, fatigue, and chest tightness.   Patient attached a copy of ECG and I will forward patient schedule message to triage to see if you can open the attachment.

## 2024-01-28 ENCOUNTER — Other Ambulatory Visit: Payer: Self-pay

## 2024-01-28 ENCOUNTER — Ambulatory Visit (HOSPITAL_BASED_OUTPATIENT_CLINIC_OR_DEPARTMENT_OTHER): Admitting: Family

## 2024-01-28 ENCOUNTER — Encounter (HOSPITAL_BASED_OUTPATIENT_CLINIC_OR_DEPARTMENT_OTHER): Payer: Self-pay | Admitting: Family

## 2024-01-28 ENCOUNTER — Other Ambulatory Visit (HOSPITAL_BASED_OUTPATIENT_CLINIC_OR_DEPARTMENT_OTHER)

## 2024-01-28 VITALS — BP 104/66 | HR 61 | Ht 65.0 in | Wt 212.0 lb

## 2024-01-28 DIAGNOSIS — R002 Palpitations: Secondary | ICD-10-CM

## 2024-01-28 DIAGNOSIS — R0789 Other chest pain: Secondary | ICD-10-CM

## 2024-01-28 DIAGNOSIS — I493 Ventricular premature depolarization: Secondary | ICD-10-CM | POA: Diagnosis not present

## 2024-01-28 NOTE — Patient Instructions (Addendum)
 Medication Instructions:   Move Propranolol  to 7:30 AM and ~3:30 PM  *If you need a refill on your cardiac medications before your next appointment, please call your pharmacy*  Lab Work: Your physician recommends that you return for lab work today  Testing/Procedures: Your physician has recommended that you wear a Zio monitor.   This monitor is a medical device that records the heart's electrical activity. Doctors most often use these monitors to diagnose arrhythmias. Arrhythmias are problems with the speed or rhythm of the heartbeat. The monitor is a small device applied to your chest. You can wear one while you do your normal daily activities. While wearing this monitor if you have any symptoms to push the button and record what you felt. Once you have worn this monitor for the period of time provider prescribed (Usually 14 days), you will return the monitor device in the postage paid box. Once it is returned they will download the data collected and provide us  with a report which the provider will then review and we will call you with those results. Important tips:  Avoid showering during the first 24 hours of wearing the monitor. Avoid excessive sweating to help maximize wear time. Do not submerge the device, no hot tubs, and no swimming pools. Keep any lotions or oils away from the patch. After 24 hours you may shower with the patch on. Take brief showers with your back facing the shower head.  Do not remove patch once it has been placed because that will interrupt data and decrease adhesive wear time. Push the button when you have any symptoms and write down what you were feeling. Once you have completed wearing your monitor, remove and place into box which has postage paid and place in your outgoing mailbox.  If for some reason you have misplaced your box then call our office and we can provide another box and/or mail it off for you.   Follow-Up:  Your next appointment:   6-8  weeks  Provider:   Sheryle Donning, MD, Slater Duncan, NP, or Neomi Banks, NP

## 2024-01-28 NOTE — Progress Notes (Signed)
 Cardiology Office Note:  .   Date:  01/29/2024  ID:  [name], DOB Jul 19, 1976, MRN [id number] PCP: Annella Kief, NP  [facility] Providers Cardiologist:  Sheryle Donning, MD    History of Present Illness: Caroline Gonzales   Caroline Gonzales is a 48 y.o. female palpitations, PVC, aortic atherosclerosis, weight loss surgery  Presents today for palpitations. Notes Episode in [place] of having palpitations, pressure, in her left chest down her arm and had converted by the time she went to ED in [place] (was on a trip). She had elevated D-dimer with oxygen desaturation down to 80s with walking with CTA reportedly unremarkable. Underwent knee surgery a month ago, recovering well. Over the last week more episdoes of while sitting still will feel like she has been kicked in the chest, sensation of not being able to get a deep breath, then PVC's. Presentsly taking Propanolol 40mg  bid with home vitals 80-110/50-70 no lightheadedness, dizziness. Recent labs 11/23/23 TSH 1.17. she avoids alcohol  and caffeine.    ROS: Please see the history of present illness.    All other systems reviewed and are negative.   Studies Reviewed: Caroline Gonzales   EKG Interpretation Date/Time:  Friday Jan 28 2024 15:03:15 EDT Ventricular Rate:  61 PR Interval:  168 QRS Duration:  84 QT Interval:  396 QTC Calculation: 398 R Axis:   32  Text Interpretation: Normal sinus rhythm  No acute ST/T wave changes Confirmed by Neomi Banks ([id number]) on 01/28/2024 3:16:13 PM    Cardiac Studies & Procedures   ______________________________________________________________________________________________     ECHOCARDIOGRAM  ECHOCARDIOGRAM COMPLETE 08/28/2022  Narrative ECHOCARDIOGRAM REPORT    Patient Name:   Caroline Gonzales Date of Exam: 08/28/2022 Medical Rec #:  [id number]         Height:       65.0 in Accession #:    [id number]        Weight:       261.0 lb Date of Birth:  [birth year]         BSA:          2.215  m Patient Age:    46 years          BP:           116/70 mmHg Patient Gender: F                 HR:           79 bpm. Exam Location:  [address]  Procedure: 2D Echo, Cardiac Doppler and Color Doppler  Indications:    R94.31 Abnormal EKG  History:        Patient has no prior history of Echocardiogram examinations. Arrythmias:PVC; Risk Factors:Hypertension, Dyslipidemia and Sleep Apnea. Palpitations. Pre-diabetes. Morbid obesity.  Sonographer:    Mylinda Asa RCS Referring Phys: [phone] MARY E BRANCH  IMPRESSIONS   1. Left ventricular ejection fraction, by estimation, is 60 to 65%. The left ventricle has normal function. The left ventricle has no regional wall motion abnormalities. Left ventricular diastolic parameters were normal. 2. Right ventricular systolic function is normal. The right ventricular size is normal. 3. The mitral valve is normal in structure. Trivial mitral valve regurgitation. No evidence of mitral stenosis. 4. The aortic valve is normal in structure. Aortic valve regurgitation is not visualized. No aortic stenosis is present. 5. The inferior vena cava is normal in size with greater than 50% respiratory variability, suggesting right atrial pressure of 3 mmHg.  FINDINGS Left Ventricle:  Left ventricular ejection fraction, by estimation, is 60 to 65%. The left ventricle has normal function. The left ventricle has no regional wall motion abnormalities. The left ventricular internal cavity size was normal in size. There is no left ventricular hypertrophy. Left ventricular diastolic parameters were normal. Normal left ventricular filling pressure.  Right Ventricle: The right ventricular size is normal. No increase in right ventricular wall thickness. Right ventricular systolic function is normal.  Left Atrium: Left atrial size was normal in size.  Right Atrium: Right atrial size was normal in size.  Pericardium: There is no evidence of pericardial  effusion.  Mitral Valve: The mitral valve is normal in structure. Trivial mitral valve regurgitation. No evidence of mitral valve stenosis.  Tricuspid Valve: The tricuspid valve is normal in structure. Tricuspid valve regurgitation is trivial. No evidence of tricuspid stenosis.  Aortic Valve: The aortic valve is normal in structure. Aortic valve regurgitation is not visualized. No aortic stenosis is present.  Pulmonic Valve: The pulmonic valve was normal in structure. Pulmonic valve regurgitation is not visualized. No evidence of pulmonic stenosis.  Aorta: The aortic root is normal in size and structure.  Venous: The inferior vena cava is normal in size with greater than 50% respiratory variability, suggesting right atrial pressure of 3 mmHg.  IAS/Shunts: No atrial level shunt detected by color flow Doppler.   LEFT VENTRICLE PLAX 2D LVIDd:         4.40 cm   Diastology LVIDs:         2.70 cm   LV e' medial:    9.25 cm/s LV PW:         1.00 cm   LV E/e' medial:  9.2 LV IVS:        1.00 cm   LV e' lateral:   16.30 cm/s LVOT diam:     1.90 cm   LV E/e' lateral: 5.2 LV SV:         66 LV SV Index:   30 LVOT Area:     2.84 cm   RIGHT VENTRICLE RV Basal diam:  2.10 cm RV Mid diam:    1.90 cm RV S prime:     13.10 cm/s TAPSE (M-mode): 2.0 cm  LEFT ATRIUM             Index        RIGHT ATRIUM           Index LA diam:        4.10 cm 1.85 cm/m   RA Area:     11.30 cm LA Vol (A2C):   32.9 ml 14.85 ml/m  RA Volume:   19.70 ml  8.89 ml/m LA Vol (A4C):   48.1 ml 21.71 ml/m LA Biplane Vol: 40.6 ml 18.33 ml/m AORTIC VALVE LVOT Vmax:   109.00 cm/s LVOT Vmean:  71.100 cm/s LVOT VTI:    0.232 m  AORTA Ao Root diam: 2.90 cm Ao Asc diam:  2.90 cm  MITRAL VALVE MV Area (PHT): 4.39 cm    SHUNTS MV Decel Time: 173 msec    Systemic VTI:  0.23 m MV E velocity: 84.90 cm/s  Systemic Diam: 1.90 cm MV A velocity: 65.50 cm/s MV E/A ratio:  1.30  Gaylyn Keas MD Electronically signed  by Gaylyn Keas MD Signature Date/Time: 08/28/2022/1:45:10 PM    Final    MONITORS  LONG TERM MONITOR (3-14 DAYS) 08/24/2022  Narrative 58 triggered events. For sinus rhythm, infrequent PVCs, and rare SVE. No significant tachyarrhythmia or  bradyarrhythmia. No atrial fibrillation or flutter.   Patch Wear Time:  2 days and 23 hours (2023-11-27T20:09:39-0500 to 2023-11-30T20:07:27-0500)  Patient had a min HR of 74 bpm, max HR of 130 bpm, and avg HR of 91 bpm. Predominant underlying rhythm was Sinus Rhythm. Isolated SVEs were rare (<1.0%), SVE Couplets were rare (<1.0%), and no SVE Triplets were present. Isolated VEs were occasional (2.5%, 9860), VE Couplets were rare (<1.0%, 1), and no VE Triplets were present. Ventricular Bigeminy and Trigeminy were present.       ______________________________________________________________________________________________      Risk Assessment/Calculations:             Physical Exam:   VS:  BP 104/66 (BP Location: Left Arm, Patient Position: Sitting, Cuff Size: Large)   Pulse 61   Ht 5\' 5"  (1.651 m)   Wt 212 lb (96.2 kg)   LMP 01/06/2021   BMI 35.28 kg/m    Wt Readings from Last 3 Encounters:  01/28/24 212 lb (96.2 kg)  01/24/24 209 lb 4.8 oz (94.9 kg)  10/27/23 210 lb 1.6 oz (95.3 kg)    GEN: Well nourished, well developed in no acute distress NECK: No JVD; No carotid bruits CARDIAC: RRR, no murmurs, rubs, gallops RESPIRATORY:  Clear to auscultation without rales, wheezing or rhonchi  ABDOMEN: Soft, non-tender, non-distended EXTREMITIES:  No edema; No deformity   ASSESSMENT AND PLAN: .    PVC / Chest pain - Recent episodes of feeling she got kicked in the chest shortly followed by feeling she can't get a deep breath and PVC. No caffeine, alcohol . Most episodes around 4-5pm. Will adjusting timing of Propranolol  BID to 7:30A and 3:30P. 14 day ZIO placed in clinic. Recent  TSH in March unremarkable. Sed rate, CRP, BMET, magnesium  today. Low suspicion for anginal equivalent as occurs at rest.        Dispo: follow up in 6-8 weeks  Signed, Clearnce Curia, NP

## 2024-01-29 ENCOUNTER — Ambulatory Visit (HOSPITAL_BASED_OUTPATIENT_CLINIC_OR_DEPARTMENT_OTHER): Payer: Self-pay | Admitting: Family

## 2024-01-29 ENCOUNTER — Encounter (HOSPITAL_BASED_OUTPATIENT_CLINIC_OR_DEPARTMENT_OTHER): Payer: Self-pay | Admitting: Family

## 2024-01-29 DIAGNOSIS — I493 Ventricular premature depolarization: Secondary | ICD-10-CM

## 2024-01-29 DIAGNOSIS — R002 Palpitations: Secondary | ICD-10-CM

## 2024-01-29 LAB — BASIC METABOLIC PANEL WITH GFR
BUN/Creatinine Ratio: 15 (ref 9–23)
BUN: 14 mg/dL (ref 6–24)
CO2: 26 mmol/L (ref 20–29)
Calcium: 9.1 mg/dL (ref 8.7–10.2)
Chloride: 104 mmol/L (ref 96–106)
Creatinine, Ser: 0.96 mg/dL (ref 0.57–1.00)
Glucose: 78 mg/dL (ref 70–99)
Potassium: 4.3 mmol/L (ref 3.5–5.2)
Sodium: 142 mmol/L (ref 134–144)
eGFR: 73 mL/min/{1.73_m2} (ref >59)

## 2024-01-29 LAB — MAGNESIUM: Magnesium: 2.4 mg/dL — ABNORMAL HIGH (ref 1.6–2.3)

## 2024-01-29 LAB — HIGH SENSITIVITY CRP: CRP, High Sensitivity: 4.93 mg/L — ABNORMAL HIGH (ref 0.00–3.00)

## 2024-01-29 LAB — SEDIMENTATION RATE: Sed Rate: 31 mm/h (ref 0–32)

## 2024-01-31 ENCOUNTER — Ambulatory Visit: Admitting: Physical Therapy

## 2024-01-31 ENCOUNTER — Encounter: Payer: Self-pay | Admitting: Physical Therapy

## 2024-01-31 ENCOUNTER — Encounter: Payer: Self-pay | Admitting: Nurse Practitioner

## 2024-01-31 DIAGNOSIS — M25561 Pain in right knee: Secondary | ICD-10-CM | POA: Diagnosis not present

## 2024-01-31 DIAGNOSIS — R030 Elevated blood-pressure reading, without diagnosis of hypertension: Secondary | ICD-10-CM

## 2024-01-31 DIAGNOSIS — E781 Pure hyperglyceridemia: Secondary | ICD-10-CM

## 2024-01-31 DIAGNOSIS — M6281 Muscle weakness (generalized): Secondary | ICD-10-CM

## 2024-01-31 DIAGNOSIS — R7303 Prediabetes: Secondary | ICD-10-CM

## 2024-01-31 DIAGNOSIS — E88819 Insulin resistance, unspecified: Secondary | ICD-10-CM

## 2024-01-31 NOTE — Therapy (Signed)
 OUTPATIENT PHYSICAL THERAPY LOWER EXTREMITY TREATMENT   Patient Name: Caroline Gonzales MRN: [id number] DOB:1976-02-04, 49 y.o., female Today's Date: 01/31/2024  END OF SESSION:  PT End of Session - 01/31/24 1046     Visit Number 4    Number of Visits 9    Date for PT Re-Evaluation 03/15/24    Authorization Type WC    Authorization - Visit Number 4    Authorization - Number of Visits 9    PT Start Time 1100    PT Stop Time 1145    PT Time Calculation (min) 45 min              Past Medical History:  Diagnosis Date   Acute costochondritis 11/14/2021   Allergy    Zyrtec .   Anxiety    followed by Dr. Abel Abelson   Back pain    Complication of anesthesia    PONV   Depression    Fibroid    Gastroparesis 09/14/2012   gastric emptying study in 2014   GERD (gastroesophageal reflux disease)    Headache    Hypertriglyceridemia 10/07/2017   Nightmares 07/21/2018   Pneumonia    2013ish   PONV (postoperative nausea and vomiting)     likes scopolamine  patch and zofran  /phenergan  helps   Pre-diabetes    PTSD (post-traumatic stress disorder)    Residual foreign body in soft tissue 11/12/2016   Screening for colon cancer 07/02/2022   Sleep apnea 07/01/2018   cpap optional, pt close to not use cpap   Vitamin D  deficiency    Wears glasses    Past Surgical History:  Procedure Laterality Date   ABDOMINAL HYSTERECTOMY  01/2021   ANKLE ARTHROSCOPY Left 2011   CESAREAN SECTION  06/2006   x 1   CHOLECYSTECTOMY  07/2006   laparoscopic   CYSTOSCOPY N/A 02/04/2021   Procedure: CYSTOSCOPY;  Surgeon: Wanita Gutta, MD;  Location: [facility] OR;  Service: Gynecology;  Laterality: N/A;   DILATION AND CURETTAGE OF UTERUS  1997   x 2   FOREIGN BODY REMOVAL Left 11/18/2016   Procedure: REMOVAL FOREIGN BODY EXTREMITY LEFT FOOT;  Surgeon: Charity Conch, DPM;  Location: [facility] OR;  Service: Podiatry;  Laterality: Left;   GASTRIC ROUX-EN-Y N/A 04/13/2023   Procedure: LAPAROSCOPIC ROUX-EN-Y  GASTRIC BYPASS WITH UPPER ENDOSCOPY;  Surgeon: Adalberto Acton, MD;  Location: [facility] ORS;  Service: General;  Laterality: N/A;   KNEE ARTHROSCOPY WITH MEDIAL MENISECTOMY Right 09/10/2021   Procedure: KNEE ARTHROSCOPY WITH MEDIAL MENISCAL ROOT REPAIR;  Surgeon: Micheline Ahr, MD;  Location: [facility];  Service: Orthopedics;  Laterality: Right;   PILONIDAL CYST EXCISION  1990's   RADIOLOGY WITH ANESTHESIA N/A 11/10/2018   Procedure: MRI WITH ANESTHESIA OF BRAIN AND ORBITS WITH AND WITHOUT CONTRAST;  Surgeon: Radiologist, Medication, MD;  Location: [facility] OR;  Service: Radiology;  Laterality: N/A;   TENDON REPAIR Left 2011   Left Ankle   TOTAL LAPAROSCOPIC HYSTERECTOMY WITH SALPINGECTOMY Bilateral 02/04/2021   Procedure: TOTAL LAPAROSCOPIC HYSTERECTOMY WITH SALPINGECTOMY;  Surgeon: Wanita Gutta, MD;  Location: [facility] OR;  Service: Gynecology;  Laterality: Bilateral;   UPPER GASTROINTESTINAL ENDOSCOPY     WISDOM TOOTH EXTRACTION  1990's   Patient Active Problem List   Diagnosis Date Noted   Family history of elevated lipoprotein (a) 04/26/2023   PVC's (premature ventricular contractions) 04/26/2023   Morbid obesity (HCC) 04/13/2023   Gastroesophageal reflux disease 12/24/2022   Abnormal laboratory test 12/24/2022   Chest  wall pain 12/24/2022   Elevated blood pressure reading without diagnosis of hypertension 12/29/2021   Change in facial mole 12/29/2021   S/P laparoscopic hysterectomy 02/04/2021   PTSD (post-traumatic stress disorder) 07/21/2018   GAD (generalized anxiety disorder) 07/21/2018   Insomnia 07/21/2018   Recurrent major depressive disorder, in partial remission (HCC) 07/21/2018   Insulin  resistance 10/21/2017   Hypertriglyceridemia 10/07/2017   Vitamin D  deficiency 10/07/2017   Pre-diabetes 11/02/2016   Morbid obesity with body mass index (BMI) of 50.0 to 59.9 in adult (HCC) 10/15/2016    PCP: Annella Kief, NP  REFERRING PROVIDER: Neil Balls, MD    REFERRING DIAG: S/P R knee Scope  THERAPY DIAG:  Right knee pain, unspecified chronicity  Muscle weakness (generalized)  Rationale for Evaluation and Treatment: Rehabilitation  ONSET DATE: 12/29/2023  SUBJECTIVE:   SUBJECTIVE STATEMENT: 01/31/2024 I saw the doctor last week. I am having to take more pain pills. I have better mobility but more pain. Unable to climb stairs with RLE.   PERTINENT HISTORY: See PMHx PAIN:  Are you having pain? Yes: NPRS scale: 0/10 current, Pain location: inside of the knee Pain description: Stabbing, slicing Aggravating factors: House work, walking too much, bending the knee Relieving factors: Stop doing the activity   PRECAUTIONS: None  RED FLAGS: None   WEIGHT BEARING RESTRICTIONS: No  FALLS:  Has patient fallen in last 6 months? 1 fall that caused this in November  LIVING ENVIRONMENT: Lives with: lives with their family Lives in: House/apartment Stairs: Yes: External: 2 steps; none Has following equipment at home: Single point cane, Walker - 2 wheeled, Crutches, and shower chair  OCCUPATION: RN in oncology   PLOF: Independent  PATIENT GOALS: to be able to do house work, and job (lifting machines/ wraps). Get rid of the pain.   OBJECTIVE:  Note: Objective measures were completed at Evaluation unless otherwise noted.  DIAGNOSTIC FINDINGS:  11/29/23 MRI R knee WO contrast IMPRESSION: 1. Postsurgical changes from interval medial meniscus posterior horn repair near the root. New small oblique horizontal tear of the posterior horn medial to the site of repair. Increased extrusion of the body. 2. Early medial and patellofemoral compartment osteoarthritis.  PATIENT SURVEYS:  LEFS 35/80  COGNITION: Overall cognitive status: Within functional limits for tasks assessed     SENSATION: Not tested   POSTURE: rounded shoulders and forward head  PALPATION: TTP along the medial aspect of the patella and medial tibiofemoral joint  line.   LOWER EXTREMITY ROM:  Active ROM Right eval Left eval Right 01/17/24  Hip flexion     Hip extension     Hip abduction     Hip adduction     Hip internal rotation     Hip external rotation     Knee flexion 125 P! 132 130  Knee extension 3  0  Ankle dorsiflexion     Ankle plantarflexion     Ankle inversion     Ankle eversion      (Blank rows = not tested)  LOWER EXTREMITY MMT:  MMT Right eval Left eval Right 01/31/24  Hip flexion 4-/5 4/5   Hip extension     Hip abduction 4/5 4/5   Hip adduction     Hip internal rotation     Hip external rotation     Knee flexion 4/5 P! 5/5   Knee extension 4/5 P! 5/5 4+ no pain  Ankle dorsiflexion     Ankle plantarflexion     Ankle inversion  Ankle eversion      (Blank rows = not tested, P!= pain)  FUNCTIONAL TESTS:    GAIT: Distance walked: from waiting area to tx room Assistive device utilized: None Level of assistance: Complete Independence Comments: foot flat gait pattern with antalgic gait pattern                                                                                                                                 TREATMENT :  [facility] Adult PT Treatment:                                                DATE: 01/31/24   Manual Therapy: Cross friction to patella tendon  Therapeutic Activity: QS into towel -pain Hip flexor stretch EOM with strap pull Prone quad stretch  with strap pull Qs into towel- no pain SLR with initial QS- challenging  Side lying Hip abdct Side lying clam  STS x 5 no pain, Attempted RLE back, increased pain.  Updated HEP / discussed open chain aerobic exercise for now.     [facility] Adult PT Treatment:                                                DATE: 01/17/24 Rec bike L1 x 3 minutes- increased soreness so discontinued SAQ x 15 right QS into towel 5 sec x 15, right SLR x 15 right Hip abduction side lying x 15 right Hip flexor stretch with strap EOM +HEP Supine h/s stretch  with strap KT tape applied to inhibit patella tendon  Declined iontophoresis due to Gastric Bypass restrictions     [facility] Adult PT Treatment:                                                DATE: 01/10/24 MTPR along the R rectus femoris IASTM along quad and patellar tendon and x friction massage Patellar mobs in all directions SAQ with quad set x 12 LAQ 1 x 10 Standing hip abduction 2 x 12 bil with RTB Standing TKE pushing ball against wall x 6 reps Updated HEP.    [facility] Adult PT Treatment:                                                DATE: 01/05/2024 MTPR along the R VMO IASTM along the patellar/ quad tendion and VMO LAQ 1 x 12,  with tibial IR to promote vastus lateralis activation Sit to stand 2 x 10, demonstration for nose over toes Standing calf stretch x 30 sec Heel strike/ toe off gait training/ education Provided initial HEP    PATIENT EDUCATION:  Education details: evaluation findings, POC, goals, HEP with proper form/ rationale Person educated: Patient Education method: Explanation, Demonstration, Verbal cues, and Handouts Education comprehension: verbalized understanding  HOME EXERCISE PROGRAM: Access Code: [email or web address] URL: [email or web address] Date: 01/10/2024 Prepared by: Laron Plummer  Exercises - Seated Long Arc Quad  - 1 x daily - 7 x weekly - 2 sets - 10 - 15 reps - Sit to Stand with Pelvic Floor Contraction  - 1 x daily - 7 x weekly - 2 sets - 10 reps - Gastroc Stretch on Wall  - 1 x daily - 7 x weekly - 2 sets - 2 reps - 30 - 60 seconds hold - Seated Hamstring Stretch  - 1 x daily - 7 x weekly - 2 sets - 2 reps - 30 - 60 seconds  hold - Seated Hip Abduction  - 1 x daily - 7 x weekly - 2 sets - 10 - 15 reps - Standing Hip Abduction with Resistance at Ankles and Counter Support  - 1 x daily - 7 x weekly - 2 sets - 10 reps (HOLD for NW)  - Standing Terminal Knee Extension at Wall with Ball  - 1 x daily - 7 x weekly - 2 sets - 10 reps - 5-10  sec hold  ASSESSMENT:  CLINICAL IMPRESSION: 01/31/2024 Mrs Mollica arrives to PT in knee brace. Saw MD who recommended 4 more weeks light duty. Returns to MD June 10th. She reports knee pain has continued to be elevated since catching her foot on a rug 2 weeks ago. MD did not seem concerned about that aspect. She has min medial knee pain however continues to have patella tendon pain. Has not completed much of HEP so this was reviewed and importance of quad stretch and avoidance of painful activities. Pt unable to use NSAIDs. She did begin session with pain during Qs. After quad stretching, she was able to complete without pain. She did find it challenging to complete SLR with full knee extension, although not painful. She will call back to schedule her remaining appts.    Evaluation Patient is a 48 y.o. F who was seen today for physical therapy evaluation and treatment for s/p   knee arthroscopy on 12/29/2023. She has functional knee ROM with total arc at 3-125 degrees with pain noted at end ranges, and pain noted with MMT, and weakness noted compared bil which is expected. She has an antalgic gait pattern, and notes pain at the medial aspect of the knee/ patella. Additionally reports R low back pain which started the day of surgery and noted could be related to the positioning in surgery. She responded well with exercises in session requiring verbal cues/ demonstration for proper form/ technique. She would benefit from physical therapy to decrease R knee pain, maximize RLE strengthf or gait and balance, and improve her overall function by addressing the deficits listed.   OBJECTIVE IMPAIRMENTS: Abnormal gait, decreased activity tolerance, decreased balance, decreased strength, increased edema, obesity, and pain.   ACTIVITY LIMITATIONS: lifting, bending, sitting, standing, squatting, stairs, and locomotion level  PARTICIPATION LIMITATIONS: shopping, community activity, occupation, and yard  work  PERSONAL FACTORS: Time since onset of injury/illness/exacerbation and 1-2 comorbidities: hx of anxiety/ depression, multiple knee surgries are also affecting patient's  functional outcome.   REHAB POTENTIAL: Excellent  CLINICAL DECISION MAKING: Evolving/moderate complexity  EVALUATION COMPLEXITY: Moderate   GOALS: Goals reviewed with patient? Yes  SHORT TERM GOALS: Target date: 02/02/2024  Pt to be IND with initial HEP for therapeutic progression Baseline: 01/31/24: performs some of HEP Goal status: ONGOING  2.  Pt to verbalize/ demo efficient heel strike/ toe off pattern to reduce strain/ tension and maximize energy conservation Baseline:  01/31/24: Min antalgic pattern  Goal status: ONGOING   3.  Pt to LEFS by >/= 10 points to demo improving function Baseline:  Goal status: INITIAL  4.  Increase Quad strength to >/= 4/ 5 with no pain during testing for strength improvement Baseline:  01/28/24: 4+/5 Goal status: MET   LONG TERM GOALS: Target date: 03/01/2024  Increase RLE strength to >/= 4+/5 to promote knee/ patellar stability with WB activities Baseline:  Goal status: INITIAL  2.  Pt to be able to sit, stand or walk for >/= 60 min with no report of limitations andmax pain to </= 2/10 for functional endurance required for work related tasks.  Baseline:  Goal status: INITIAL  3.  Pt to be able to lift/ lower >/= 30# from the floor with </= 2/10 max pain for job specific requirements, and personal goals of performing house work with no limitations Baseline:  Goal status: INITIAL  4.  Pt to improve LEFS score to >/= 55/80 to demonstrate improvement in function Baseline:  Goal status: INITIAL  5.  Pt to be IND with all HEP and is able to maintain and progress current LOF IND Baseline:  Goal status: INITIAL    PLAN:  PT FREQUENCY: 1-2x/week  PT DURATION: 8 weeks  PLANNED INTERVENTIONS: 97110-Therapeutic exercises, 97530- Therapeutic activity, [id number]-  Neuromuscular re-education, 97535- Self Care, 16109- Manual therapy, [id number]- Gait training, 97016- Vasopneumatic device, [id number]- Ultrasound, 60454- Ionotophoresis 4mg /ml Dexamethasone , Patient/Family education, Taping, Dry Needling, Joint mobilization, Cryotherapy, and Moist heat  PLAN FOR NEXT SESSION: Review/ update HEP PRN. Patellar mob, gross knee/ hip strength. Gait training, vaso for pain / swelling PRN. Limited tolerance with laying supine due to R low back pain.  Send notes to [facility] Case manager. NO Conway Dennis, PTA 01/31/24 11:47 AM Phone: [phone] Fax: [phone]

## 2024-02-01 DIAGNOSIS — F4310 Post-traumatic stress disorder, unspecified: Secondary | ICD-10-CM | POA: Diagnosis not present

## 2024-02-02 ENCOUNTER — Encounter: Admitting: Physical Therapy

## 2024-02-08 DIAGNOSIS — F4310 Post-traumatic stress disorder, unspecified: Secondary | ICD-10-CM | POA: Diagnosis not present

## 2024-02-11 ENCOUNTER — Ambulatory Visit: Attending: Family

## 2024-02-11 DIAGNOSIS — R002 Palpitations: Secondary | ICD-10-CM

## 2024-02-11 DIAGNOSIS — I493 Ventricular premature depolarization: Secondary | ICD-10-CM

## 2024-02-11 NOTE — Progress Notes (Unsigned)
 Enrolled patient for a 14 day Zio XT monitor to be mailed to patients home  Caroline Gonzales to read

## 2024-02-11 NOTE — Telephone Encounter (Signed)
 Order another or wait until we get results?

## 2024-02-11 NOTE — Addendum Note (Signed)
 Addended by: Guss Legacy on: 02/11/2024 04:54 PM   Modules accepted: Orders

## 2024-02-15 DIAGNOSIS — F4310 Post-traumatic stress disorder, unspecified: Secondary | ICD-10-CM | POA: Diagnosis not present

## 2024-02-16 ENCOUNTER — Other Ambulatory Visit: Payer: Self-pay

## 2024-02-17 DIAGNOSIS — R002 Palpitations: Secondary | ICD-10-CM | POA: Diagnosis not present

## 2024-02-17 DIAGNOSIS — I493 Ventricular premature depolarization: Secondary | ICD-10-CM | POA: Diagnosis not present

## 2024-02-17 NOTE — Therapy (Signed)
 OUTPATIENT PHYSICAL THERAPY LOWER EXTREMITY TREATMENT   Patient Name: Caroline Gonzales MRN: [id number] DOB:Jan 24, 1976, 48 y.o., female Today's Date: 02/18/2024  END OF SESSION:  PT End of Session - 02/18/24 0746     Visit Number 5    Number of Visits 9    Date for PT Re-Evaluation 03/15/24    Authorization Type WC    Authorization Time Period Auth for 2x week until 02/22/24 per appt notes    PT Start Time 0747    PT Stop Time 0829    PT Time Calculation (min) 42 min               Past Medical History:  Diagnosis Date   Acute costochondritis 11/14/2021   Allergy    Zyrtec .   Anxiety    followed by Dr. Abel Abelson   Back pain    Complication of anesthesia    PONV   Depression    Fibroid    Gastroparesis 09/14/2012   gastric emptying study in 2014   GERD (gastroesophageal reflux disease)    Headache    Hypertriglyceridemia 10/07/2017   Nightmares 07/21/2018   Pneumonia    2013ish   PONV (postoperative nausea and vomiting)     likes scopolamine  patch and zofran  /phenergan  helps   Pre-diabetes    PTSD (post-traumatic stress disorder)    Residual foreign body in soft tissue 11/12/2016   Screening for colon cancer 07/02/2022   Sleep apnea 07/01/2018   cpap optional, pt close to not use cpap   Vitamin D  deficiency    Wears glasses    Past Surgical History:  Procedure Laterality Date   ABDOMINAL HYSTERECTOMY  01/2021   ANKLE ARTHROSCOPY Left 2011   CESAREAN SECTION  06/2006   x 1   CHOLECYSTECTOMY  07/2006   laparoscopic   CYSTOSCOPY N/A 02/04/2021   Procedure: CYSTOSCOPY;  Surgeon: Wanita Gutta, MD;  Location: [facility] OR;  Service: Gynecology;  Laterality: N/A;   DILATION AND CURETTAGE OF UTERUS  1997   x 2   FOREIGN BODY REMOVAL Left 11/18/2016   Procedure: REMOVAL FOREIGN BODY EXTREMITY LEFT FOOT;  Surgeon: Charity Conch, DPM;  Location: [facility] OR;  Service: Podiatry;  Laterality: Left;   GASTRIC ROUX-EN-Y N/A 04/13/2023   Procedure: LAPAROSCOPIC  ROUX-EN-Y GASTRIC BYPASS WITH UPPER ENDOSCOPY;  Surgeon: Adalberto Acton, MD;  Location: [facility] ORS;  Service: General;  Laterality: N/A;   KNEE ARTHROSCOPY WITH MEDIAL MENISECTOMY Right 09/10/2021   Procedure: KNEE ARTHROSCOPY WITH MEDIAL MENISCAL ROOT REPAIR;  Surgeon: Micheline Ahr, MD;  Location: [facility];  Service: Orthopedics;  Laterality: Right;   PILONIDAL CYST EXCISION  1990's   RADIOLOGY WITH ANESTHESIA N/A 11/10/2018   Procedure: MRI WITH ANESTHESIA OF BRAIN AND ORBITS WITH AND WITHOUT CONTRAST;  Surgeon: Radiologist, Medication, MD;  Location: [facility] OR;  Service: Radiology;  Laterality: N/A;   TENDON REPAIR Left 2011   Left Ankle   TOTAL LAPAROSCOPIC HYSTERECTOMY WITH SALPINGECTOMY Bilateral 02/04/2021   Procedure: TOTAL LAPAROSCOPIC HYSTERECTOMY WITH SALPINGECTOMY;  Surgeon: Wanita Gutta, MD;  Location: [facility] OR;  Service: Gynecology;  Laterality: Bilateral;   UPPER GASTROINTESTINAL ENDOSCOPY     WISDOM TOOTH EXTRACTION  1990's   Patient Active Problem List   Diagnosis Date Noted   Family history of elevated lipoprotein (a) 04/26/2023   PVC's (premature ventricular contractions) 04/26/2023   Morbid obesity (HCC) 04/13/2023   Gastroesophageal reflux disease 12/24/2022   Abnormal laboratory test 12/24/2022   Chest wall  pain 12/24/2022   Elevated blood pressure reading without diagnosis of hypertension 12/29/2021   Change in facial mole 12/29/2021   S/P laparoscopic hysterectomy 02/04/2021   PTSD (post-traumatic stress disorder) 07/21/2018   GAD (generalized anxiety disorder) 07/21/2018   Insomnia 07/21/2018   Recurrent major depressive disorder, in partial remission (HCC) 07/21/2018   Insulin  resistance 10/21/2017   Hypertriglyceridemia 10/07/2017   Vitamin D  deficiency 10/07/2017   Pre-diabetes 11/02/2016   Morbid obesity with body mass index (BMI) of 50.0 to 59.9 in adult [facility]) 10/15/2016    PCP: Annella Kief, NP  REFERRING PROVIDER: Neil Balls,  MD   REFERRING DIAG: S/P R knee Scope  THERAPY DIAG:  Right knee pain, unspecified chronicity  Muscle weakness (generalized)  Other abnormalities of gait and mobility  Rationale for Evaluation and Treatment: Rehabilitation  ONSET DATE: 12/29/2023  SUBJECTIVE:   SUBJECTIVE STATEMENT: 02/18/2024 states she has been doing okay. Went to a gentle yoga class last week. Still having soreness, walking is tender, can't kneel on it. Seated duty at work, still using brace at times. Having difficulty squatting. Does note she is feeling much better than last couple visits.     PERTINENT HISTORY: See PMHx PAIN:  Are you having pain? Yes: NPRS scale: 0/10 current, Pain location: inside of the knee Pain description: Stabbing, slicing Aggravating factors: House work, walking too much, bending the knee Relieving factors: Stop doing the activity   PRECAUTIONS: None  RED FLAGS: None   WEIGHT BEARING RESTRICTIONS: No  FALLS:  Has patient fallen in last 6 months? 1 fall that caused this in November  LIVING ENVIRONMENT: Lives with: lives with their family Lives in: House/apartment Stairs: Yes: External: 2 steps; none Has following equipment at home: Single point cane, Walker - 2 wheeled, Crutches, and shower chair  OCCUPATION: RN in oncology   PLOF: Independent  PATIENT GOALS: to be able to do house work, and job (lifting machines/ wraps). Get rid of the pain.   OBJECTIVE:  Note: Objective measures were completed at Evaluation unless otherwise noted.  DIAGNOSTIC FINDINGS:  11/29/23 MRI R knee WO contrast IMPRESSION: 1. Postsurgical changes from interval medial meniscus posterior horn repair near the root. New small oblique horizontal tear of the posterior horn medial to the site of repair. Increased extrusion of the body. 2. Early medial and patellofemoral compartment osteoarthritis.  PATIENT SURVEYS:  LEFS 35/80  COGNITION: Overall cognitive status: Within functional limits  for tasks assessed     SENSATION: Not tested   POSTURE: rounded shoulders and forward head  PALPATION: TTP along the medial aspect of the patella and medial tibiofemoral joint line.   LOWER EXTREMITY ROM:  Active ROM Right eval Left eval Right 01/17/24  Hip flexion     Hip extension     Hip abduction     Hip adduction     Hip internal rotation     Hip external rotation     Knee flexion 125 P! 132 130  Knee extension 3  0  Ankle dorsiflexion     Ankle plantarflexion     Ankle inversion     Ankle eversion      (Blank rows = not tested)  LOWER EXTREMITY MMT:  MMT Right eval Left eval Right 01/31/24  Hip flexion 4-/5 4/5   Hip extension     Hip abduction 4/5 4/5   Hip adduction     Hip internal rotation     Hip external rotation     Knee flexion  4/5 P! 5/5   Knee extension 4/5 P! 5/5 4+ no pain  Ankle dorsiflexion     Ankle plantarflexion     Ankle inversion     Ankle eversion      (Blank rows = not tested, P!= pain)  FUNCTIONAL TESTS:    GAIT: Distance walked: from waiting area to tx room Assistive device utilized: None Level of assistance: Complete Independence Comments: foot flat gait pattern with antalgic gait pattern                                                                                                                                 TREATMENT :  [facility] Adult PT Treatment:                                                DATE: 02/17/24  Neuromuscular re-ed: Quad set x12  Quad set + SLR x10 SLS + 3 way tap (RLE stance) 2x8 laps  KT taping quad/patellar tendon + education on care/monitoring  Therapeutic Activity: STS x8 from lowest mat cues for trunk mechanics and reduced ant tib translation Staggered STS RLE bias x8 4 inch step up 3x5 cues for trunk mechanics and pacing Education on mechanics, activity modification, and relevant anatomy/physiology as pertains to functional mobility    HEP update + education/handout    [facility] Adult PT  Treatment:                                                DATE: 01/31/24   Manual Therapy: Cross friction to patella tendon  Therapeutic Activity: QS into towel -pain Hip flexor stretch EOM with strap pull Prone quad stretch  with strap pull Qs into towel- no pain SLR with initial QS- challenging  Side lying Hip abdct Side lying clam  STS x 5 no pain, Attempted RLE back, increased pain.  Updated HEP / discussed open chain aerobic exercise for now.     [facility] Adult PT Treatment:                                                DATE: 01/17/24 Rec bike L1 x 3 minutes- increased soreness so discontinued SAQ x 15 right QS into towel 5 sec x 15, right SLR x 15 right Hip abduction side lying x 15 right Hip flexor stretch with strap EOM +HEP Supine h/s stretch with strap KT tape applied to inhibit patella tendon  Declined iontophoresis due to Gastric Bypass restrictions     [facility] Adult PT Treatment:  DATE: 01/10/24 MTPR along the R rectus femoris IASTM along quad and patellar tendon and x friction massage Patellar mobs in all directions SAQ with quad set x 12 LAQ 1 x 10 Standing hip abduction 2 x 12 bil with RTB Standing TKE pushing ball against wall x 6 reps Updated HEP.    [facility] Adult PT Treatment:                                                DATE: 01/05/2024 MTPR along the R VMO IASTM along the patellar/ quad tendion and VMO LAQ 1 x 12, with tibial IR to promote vastus lateralis activation Sit to stand 2 x 10, demonstration for nose over toes Standing calf stretch x 30 sec Heel strike/ toe off gait training/ education Provided initial HEP    PATIENT EDUCATION:  Education details: rationale for interventions, HEP  Person educated: Patient Education method: Explanation, Demonstration, Tactile cues, Verbal cues Education comprehension: verbalized understanding, returned demonstration, verbal cues required, tactile cues required,  and needs further education     HOME EXERCISE PROGRAM: Access Code: [email or web address] URL: [email or web address] Date: 02/18/2024 Prepared by: Mayme Spearman  Exercises - Gastroc Stretch on Wall  - 1 x daily - 7 x weekly - 2 sets - 2 reps - 30 - 60 seconds hold - Seated Hamstring Stretch  - 1 x daily - 7 x weekly - 2 sets - 2 reps - 30 - 60 seconds  hold - Supine Quadriceps Stretch with Strap on Table  - 1 x daily - 7 x weekly - 3 reps - 30 hold - Standing Terminal Knee Extension at Wall with Ball  - 1 x daily - 7 x weekly - 2 sets - 10 reps - 5-10 sec hold - Sidelying Hip Abduction  - 1 x daily - 7 x weekly - 2 sets - 10 reps - Active straight leg raise  - 1 x daily - 7 x weekly - 2 sets - 10 reps - Sit to Stand  - 1 x daily - 7 x weekly - 2 sets - 8 reps - Forward Step Up  - 1 x daily - 7 x weekly - 2 sets - 5 reps  ASSESSMENT:  CLINICAL IMPRESSION: 02/18/2024 Pt arrives w/o resting pain, reports some improvement compared to last sessions. Familiar exercises are non-painful and she reports significantly less exertion with them. Does note that she would like to work on Ecologist, she tolerates this well with fatigue and notes improved tolerance with cues for trunk mechanics. No adverse events, pt tolerates session well without increase in pain. Recommend continuing along current POC in order to address relevant deficits and improve functional tolerance. Pt departs today's session in no acute distress, all voiced questions/concerns addressed appropriately from PT perspective.      Evaluation Patient is a 48 y.o. F who was seen today for physical therapy evaluation and treatment for s/p   knee arthroscopy on 12/29/2023. She has functional knee ROM with total arc at 3-125 degrees with pain noted at end ranges, and pain noted with MMT, and weakness noted compared bil which is expected. She has an antalgic gait pattern, and notes pain at the medial aspect of the knee/ patella.  Additionally reports R low back pain which started the day of surgery and noted could be related to the positioning in surgery.  She responded well with exercises in session requiring verbal cues/ demonstration for proper form/ technique. She would benefit from physical therapy to decrease R knee pain, maximize RLE strengthf or gait and balance, and improve her overall function by addressing the deficits listed.   OBJECTIVE IMPAIRMENTS: Abnormal gait, decreased activity tolerance, decreased balance, decreased strength, increased edema, obesity, and pain.   ACTIVITY LIMITATIONS: lifting, bending, sitting, standing, squatting, stairs, and locomotion level  PARTICIPATION LIMITATIONS: shopping, community activity, occupation, and yard work  PERSONAL FACTORS: Time since onset of injury/illness/exacerbation and 1-2 comorbidities: hx of anxiety/ depression, multiple knee surgries are also affecting patient's functional outcome.   REHAB POTENTIAL: Excellent  CLINICAL DECISION MAKING: Evolving/moderate complexity  EVALUATION COMPLEXITY: Moderate   GOALS: Goals reviewed with patient? Yes  SHORT TERM GOALS: Target date: 02/02/2024  Pt to be IND with initial HEP for therapeutic progression Baseline: 01/31/24: performs some of HEP Goal status: ONGOING  2.  Pt to verbalize/ demo efficient heel strike/ toe off pattern to reduce strain/ tension and maximize energy conservation Baseline:  01/31/24: Min antalgic pattern  Goal status: ONGOING   3.  Pt to LEFS by >/= 10 points to demo improving function Baseline:  Goal status: INITIAL  4.  Increase Quad strength to >/= 4/ 5 with no pain during testing for strength improvement Baseline:  01/28/24: 4+/5 Goal status: MET   LONG TERM GOALS: Target date: 03/01/2024  Increase RLE strength to >/= 4+/5 to promote knee/ patellar stability with WB activities Baseline:  Goal status: INITIAL  2.  Pt to be able to sit, stand or walk for >/= 60 min with no  report of limitations andmax pain to </= 2/10 for functional endurance required for work related tasks.  Baseline:  Goal status: INITIAL  3.  Pt to be able to lift/ lower >/= 30# from the floor with </= 2/10 max pain for job specific requirements, and personal goals of performing house work with no limitations Baseline:  Goal status: INITIAL  4.  Pt to improve LEFS score to >/= 55/80 to demonstrate improvement in function Baseline:  Goal status: INITIAL  5.  Pt to be IND with all HEP and is able to maintain and progress current LOF IND Baseline:  Goal status: INITIAL    PLAN:  PT FREQUENCY: 1-2x/week  PT DURATION: 8 weeks  PLANNED INTERVENTIONS: 97110-Therapeutic exercises, 97530- Therapeutic activity, [id number]- Neuromuscular re-education, 97535- Self Care, [id number]- Manual therapy, [id number]- Gait training, 97016- Vasopneumatic device, [id number]- Ultrasound, 67591- Ionotophoresis 4mg /ml Dexamethasone , Patient/Family education, Taping, Dry Needling, Joint mobilization, Cryotherapy, and Moist heat  PLAN FOR NEXT SESSION: Review/ update HEP PRN. Patellar mob, gross knee/ hip strength. Gait training, vaso for pain / swelling PRN. Limited tolerance with laying supine due to R low back pain.  Send notes to [facility] Case manager. NO IONTO   Lovett Ruck PT, DPT 02/18/2024 11:39 AM

## 2024-02-18 ENCOUNTER — Ambulatory Visit: Attending: Orthopedic Surgery | Admitting: Physical Therapy

## 2024-02-18 ENCOUNTER — Encounter: Payer: Self-pay | Admitting: Physical Therapy

## 2024-02-18 DIAGNOSIS — M6281 Muscle weakness (generalized): Secondary | ICD-10-CM | POA: Insufficient documentation

## 2024-02-18 DIAGNOSIS — R600 Localized edema: Secondary | ICD-10-CM | POA: Diagnosis present

## 2024-02-18 DIAGNOSIS — M25561 Pain in right knee: Secondary | ICD-10-CM | POA: Insufficient documentation

## 2024-02-18 DIAGNOSIS — R2689 Other abnormalities of gait and mobility: Secondary | ICD-10-CM | POA: Diagnosis present

## 2024-02-21 ENCOUNTER — Ambulatory Visit: Admitting: Physical Therapy

## 2024-02-21 DIAGNOSIS — M6281 Muscle weakness (generalized): Secondary | ICD-10-CM

## 2024-02-21 DIAGNOSIS — M25561 Pain in right knee: Secondary | ICD-10-CM

## 2024-02-21 DIAGNOSIS — R600 Localized edema: Secondary | ICD-10-CM

## 2024-02-21 DIAGNOSIS — R2689 Other abnormalities of gait and mobility: Secondary | ICD-10-CM

## 2024-02-21 NOTE — Therapy (Signed)
 OUTPATIENT PHYSICAL THERAPY LOWER EXTREMITY TREATMENT   Patient Name: Caroline Gonzales MRN: [id number] DOB:11/02/1975, 48 y.o., female Today's Date: 02/21/2024  END OF SESSION:  PT End of Session - 02/21/24 1420     Visit Number 6    Number of Visits 9    Date for PT Re-Evaluation 03/15/24    Authorization Type WC    Authorization Time Period Auth for 2x week until 02/22/24 per appt notes    Authorization - Visit Number 5    Authorization - Number of Visits 9    PT Start Time 1419    PT Stop Time 1502    PT Time Calculation (min) 43 min    Activity Tolerance Patient tolerated treatment well    Behavior During Therapy [facility] for tasks assessed/performed                Past Medical History:  Diagnosis Date   Acute costochondritis 11/14/2021   Allergy    Zyrtec .   Anxiety    followed by Dr. Abel Abelson   Back pain    Complication of anesthesia    PONV   Depression    Fibroid    Gastroparesis 09/14/2012   gastric emptying study in 2014   GERD (gastroesophageal reflux disease)    Headache    Hypertriglyceridemia 10/07/2017   Nightmares 07/21/2018   Pneumonia    2013ish   PONV (postoperative nausea and vomiting)     likes scopolamine  patch and zofran  /phenergan  helps   Pre-diabetes    PTSD (post-traumatic stress disorder)    Residual foreign body in soft tissue 11/12/2016   Screening for colon cancer 07/02/2022   Sleep apnea 07/01/2018   cpap optional, pt close to not use cpap   Vitamin D  deficiency    Wears glasses    Past Surgical History:  Procedure Laterality Date   ABDOMINAL HYSTERECTOMY  01/2021   ANKLE ARTHROSCOPY Left 2011   CESAREAN SECTION  06/2006   x 1   CHOLECYSTECTOMY  07/2006   laparoscopic   CYSTOSCOPY N/A 02/04/2021   Procedure: CYSTOSCOPY;  Surgeon: Wanita Gutta, MD;  Location: [facility] OR;  Service: Gynecology;  Laterality: N/A;   DILATION AND CURETTAGE OF UTERUS  1997   x 2   FOREIGN BODY REMOVAL Left 11/18/2016   Procedure: REMOVAL  FOREIGN BODY EXTREMITY LEFT FOOT;  Surgeon: Charity Conch, DPM;  Location: [facility] OR;  Service: Podiatry;  Laterality: Left;   GASTRIC ROUX-EN-Y N/A 04/13/2023   Procedure: LAPAROSCOPIC ROUX-EN-Y GASTRIC BYPASS WITH UPPER ENDOSCOPY;  Surgeon: Adalberto Acton, MD;  Location: [facility] ORS;  Service: General;  Laterality: N/A;   KNEE ARTHROSCOPY WITH MEDIAL MENISECTOMY Right 09/10/2021   Procedure: KNEE ARTHROSCOPY WITH MEDIAL MENISCAL ROOT REPAIR;  Surgeon: Micheline Ahr, MD;  Location: [facility];  Service: Orthopedics;  Laterality: Right;   PILONIDAL CYST EXCISION  1990's   RADIOLOGY WITH ANESTHESIA N/A 11/10/2018   Procedure: MRI WITH ANESTHESIA OF BRAIN AND ORBITS WITH AND WITHOUT CONTRAST;  Surgeon: Radiologist, Medication, MD;  Location: [facility] OR;  Service: Radiology;  Laterality: N/A;   TENDON REPAIR Left 2011   Left Ankle   TOTAL LAPAROSCOPIC HYSTERECTOMY WITH SALPINGECTOMY Bilateral 02/04/2021   Procedure: TOTAL LAPAROSCOPIC HYSTERECTOMY WITH SALPINGECTOMY;  Surgeon: Wanita Gutta, MD;  Location: [facility] OR;  Service: Gynecology;  Laterality: Bilateral;   UPPER GASTROINTESTINAL ENDOSCOPY     WISDOM TOOTH EXTRACTION  1990's   Patient Active Problem List   Diagnosis Date Noted  Family history of elevated lipoprotein (a) 04/26/2023   PVC's (premature ventricular contractions) 04/26/2023   Morbid obesity (HCC) 04/13/2023   Gastroesophageal reflux disease 12/24/2022   Abnormal laboratory test 12/24/2022   Chest wall pain 12/24/2022   Elevated blood pressure reading without diagnosis of hypertension 12/29/2021   Change in facial mole 12/29/2021   S/P laparoscopic hysterectomy 02/04/2021   PTSD (post-traumatic stress disorder) 07/21/2018   GAD (generalized anxiety disorder) 07/21/2018   Insomnia 07/21/2018   Recurrent major depressive disorder, in partial remission (HCC) 07/21/2018   Insulin  resistance 10/21/2017   Hypertriglyceridemia 10/07/2017   Vitamin D  deficiency  10/07/2017   Pre-diabetes 11/02/2016   Morbid obesity with body mass index (BMI) of 50.0 to 59.9 in adult [facility]) 10/15/2016    PCP: Annella Kief, NP  REFERRING PROVIDER: Neil Balls, MD   REFERRING DIAG: S/P R knee Scope  THERAPY DIAG:  Right knee pain, unspecified chronicity  Muscle weakness (generalized)  Other abnormalities of gait and mobility  Localized edema  Rationale for Evaluation and Treatment: Rehabilitation  ONSET DATE: 12/29/2023  SUBJECTIVE:   SUBJECTIVE STATEMENT: 02/21/2024 "The knee is doing much better, I see the ortho MD to see if they will release me to walk. I do have trouble with stairs and squatting. The last session the tape feels pretty good"    PERTINENT HISTORY: See PMHx PAIN:  Are you having pain? Yes: NPRS scale: 0/10 current, Pain location: inside of the knee Pain description: Stabbing, slicing Aggravating factors: House work, walking too much, bending the knee Relieving factors: Stop doing the activity   PRECAUTIONS: None  RED FLAGS: None   WEIGHT BEARING RESTRICTIONS: No  FALLS:  Has patient fallen in last 6 months? 1 fall that caused this in November  LIVING ENVIRONMENT: Lives with: lives with their family Lives in: House/apartment Stairs: Yes: External: 2 steps; none Has following equipment at home: Single point cane, Walker - 2 wheeled, Crutches, and shower chair  OCCUPATION: RN in oncology   PLOF: Independent  PATIENT GOALS: to be able to do house work, and job (lifting machines/ wraps). Get rid of the pain.   OBJECTIVE:  Note: Objective measures were completed at Evaluation unless otherwise noted.  DIAGNOSTIC FINDINGS:  11/29/23 MRI R knee WO contrast IMPRESSION: 1. Postsurgical changes from interval medial meniscus posterior horn repair near the root. New small oblique horizontal tear of the posterior horn medial to the site of repair. Increased extrusion of the body. 2. Early medial and patellofemoral  compartment osteoarthritis.  PATIENT SURVEYS:  LEFS 35/80 02/21/24  54/80  COGNITION: Overall cognitive status: Within functional limits for tasks assessed     SENSATION: Not tested   POSTURE: rounded shoulders and forward head  PALPATION: TTP along the medial aspect of the patella and medial tibiofemoral joint line.   LOWER EXTREMITY ROM:  Active ROM Right eval Left eval Right 01/17/24  Hip flexion     Hip extension     Hip abduction     Hip adduction     Hip internal rotation     Hip external rotation     Knee flexion 125 P! 132 130  Knee extension 3  0  Ankle dorsiflexion     Ankle plantarflexion     Ankle inversion     Ankle eversion      (Blank rows = not tested)  LOWER EXTREMITY MMT:  MMT Right eval Left eval Right 01/31/24  Hip flexion 4-/5 4/5   Hip extension  Hip abduction 4/5 4/5   Hip adduction     Hip internal rotation     Hip external rotation     Knee flexion 4/5 P! 5/5   Knee extension 4/5 P! 5/5 4+ no pain  Ankle dorsiflexion     Ankle plantarflexion     Ankle inversion     Ankle eversion      (Blank rows = not tested, P!= pain)  FUNCTIONAL TESTS:    GAIT: Distance walked: from waiting area to tx room Assistive device utilized: None Level of assistance: Complete Independence Comments: foot flat gait pattern with antalgic gait pattern                                                                                                                                 TREATMENT :  [facility] Adult PT Treatment:                                                DATE: 02/21/24 Stair assessment on 6 inche steps IASTM along proximal patellar tendon Pre-wrap cho-pat strap applied Low load long duration superior patellar tendon stretch  Knee extension con bil/ ecc RLE only 10 # 2 x 12 Step ups on hip machine RLE 1 x 12 Leg press con bil/ ecc RLE 1 x 12 with 40#, 1 x 12 60# Reassessed steps, reduction in soreness noted. Reviewed techniques  [facility]  Adult PT Treatment:                                                DATE: 02/17/24  Neuromuscular re-ed: Quad set x12  Quad set + SLR x10 SLS + 3 way tap (RLE stance) 2x8 laps  KT taping quad/patellar tendon + education on care/monitoring  Therapeutic Activity: STS x8 from lowest mat cues for trunk mechanics and reduced ant tib translation Staggered STS RLE bias x8 4 inch step up 3x5 cues for trunk mechanics and pacing Education on mechanics, activity modification, and relevant anatomy/physiology as pertains to functional mobility    HEP update + education/handout    [facility] Adult PT Treatment:                                                DATE: 01/31/24  Manual Therapy: Cross friction to patella tendon  Therapeutic Activity: QS into towel -pain Hip flexor stretch EOM with strap pull Prone quad stretch  with strap pull Qs into towel- no pain SLR with initial QS- challenging  Side lying Hip abdct Side lying clam  STS x 5 no pain, Attempted RLE back, increased pain.  Updated HEP / discussed open chain aerobic exercise for now.     PATIENT EDUCATION:  Education details: rationale for interventions, HEP  Person educated: Patient Education method: Explanation, Demonstration, Tactile cues, Verbal cues Education comprehension: verbalized understanding, returned demonstration, verbal cues required, tactile cues required, and needs further education     HOME EXERCISE PROGRAM: Access Code: [email or web address] URL: [email or web address] Date: 02/18/2024 Prepared by: Mayme Spearman  Exercises - Gastroc Stretch on Wall  - 1 x daily - 7 x weekly - 2 sets - 2 reps - 30 - 60 seconds hold - Seated Hamstring Stretch  - 1 x daily - 7 x weekly - 2 sets - 2 reps - 30 - 60 seconds  hold - Supine Quadriceps Stretch with Strap on Table  - 1 x daily - 7 x weekly - 3 reps - 30 hold - Standing Terminal Knee Extension at Wall with Ball  - 1 x daily - 7 x weekly - 2 sets - 10 reps - 5-10 sec  hold - Sidelying Hip Abduction  - 1 x daily - 7 x weekly - 2 sets - 10 reps - Active straight leg raise  - 1 x daily - 7 x weekly - 2 sets - 10 reps - Sit to Stand  - 1 x daily - 7 x weekly - 2 sets - 8 reps - Forward Step Up  - 1 x daily - 7 x weekly - 2 sets - 5 reps  ASSESSMENT:  CLINICAL IMPRESSION: 02/21/2024 Caroline Gonzales arrives to PT today noting improvement in her R knee and is able to do more with it. She does continued to demonstrate challenges squatting and stairs. Focused STW along patellar tendon and trialed make shift cho-pat strap which she noted some relief of tension with stairs. Continued remainder of session focusing on strengthening which she did well with but does fatigue quickly. End of session she noted feeling good in the patellar tendon and re-trialed steps which she noted some improvement but continued soreness with decsending steps when leading with the LLE. She is making good progress with physical therapy, improved her LEFS by 19 points which is clinically significant, she would benefit from continued physical therapy to decrease R knee pain, promote gross RLE strength to maximize stability and improve endurance to assist with her work related tasks.      Evaluation Patient is a 48 y.o. F who was seen today for physical therapy evaluation and treatment for s/p   knee arthroscopy on 12/29/2023. She has functional knee ROM with total arc at 3-125 degrees with pain noted at end ranges, and pain noted with MMT, and weakness noted compared bil which is expected. She has an antalgic gait pattern, and notes pain at the medial aspect of the knee/ patella. Additionally reports R low back pain which started the day of surgery and noted could be related to the positioning in surgery. She responded well with exercises in session requiring verbal cues/ demonstration for proper form/ technique. She would benefit from physical therapy to decrease R knee pain, maximize RLE strengthf or gait  and balance, and improve her overall function by addressing the deficits listed.   OBJECTIVE IMPAIRMENTS: Abnormal gait, decreased activity tolerance, decreased balance, decreased strength, increased edema, obesity, and pain.   ACTIVITY LIMITATIONS: lifting, bending, sitting, standing, squatting, stairs, and locomotion level  PARTICIPATION LIMITATIONS: shopping, community activity, occupation, and yard work  PERSONAL FACTORS: Time since  onset of injury/illness/exacerbation and 1-2 comorbidities: hx of anxiety/ depression, multiple knee surgries are also affecting patient's functional outcome.   REHAB POTENTIAL: Excellent  CLINICAL DECISION MAKING: Evolving/moderate complexity  EVALUATION COMPLEXITY: Moderate   GOALS: Goals reviewed with patient? Yes  SHORT TERM GOALS: Target date: 02/02/2024  Pt to be IND with initial HEP for therapeutic progression Baseline: 01/31/24: performs some of HEP Goal status: MET 02/21/24  2.  Pt to verbalize/ demo efficient heel strike/ toe off pattern to reduce strain/ tension and maximize energy conservation Baseline:  01/31/24: Min antalgic pattern  Goal status: MET 02/21/24   3.  Pt to LEFS by >/= 10 points to demo improving function Baseline:  Goal status: MET 02/21/24  4.  Increase Quad strength to >/= 4/ 5 with no pain during testing for strength improvement Baseline:  01/28/24: 4+/5 Goal status: MET   LONG TERM GOALS: Target date: 03/01/2024  Increase RLE strength to >/= 4+/5 to promote knee/ patellar stability with WB activities Baseline:  Goal status: INITIAL  2.  Pt to be able to sit, stand or walk for >/= 60 min with no report of limitations andmax pain to </= 2/10 for functional endurance required for work related tasks.  Baseline:  Goal status: INITIAL  3.  Pt to be able to lift/ lower >/= 30# from the floor with </= 2/10 max pain for job specific requirements, and personal goals of performing house work with no limitations Baseline:   Goal status: INITIAL  4.  Pt to improve LEFS score to >/= 55/80 to demonstrate improvement in function Baseline:  Goal status: INITIAL  5.  Pt to be IND with all HEP and is able to maintain and progress current LOF IND Baseline:  Goal status: INITIAL    PLAN:  PT FREQUENCY: 1-2x/week  PT DURATION: 8 weeks  PLANNED INTERVENTIONS: 97110-Therapeutic exercises, 97530- Therapeutic activity, [id number]- Neuromuscular re-education, 97535- Self Care, 13086- Manual therapy, [id number]- Gait training, 97016- Vasopneumatic device, [id number]- Ultrasound, [id number]- Ionotophoresis 4mg /ml Dexamethasone , Patient/Family education, Taping, Dry Needling, Joint mobilization, Cryotherapy, and Moist heat  PLAN FOR NEXT SESSION: Review/ update HEP PRN. Patellar mob, gross knee/ hip strength. Gait training, vaso for pain / swelling PRN. Limited tolerance with laying supine due to R low back pain.  Send notes to [facility] Case manager. NO IONTO   Kyheem Bathgate PT, DPT, LAT, ATC  02/21/24  3:17 PM

## 2024-02-22 DIAGNOSIS — F4310 Post-traumatic stress disorder, unspecified: Secondary | ICD-10-CM | POA: Diagnosis not present

## 2024-02-27 DIAGNOSIS — I493 Ventricular premature depolarization: Secondary | ICD-10-CM | POA: Diagnosis not present

## 2024-02-27 DIAGNOSIS — R002 Palpitations: Secondary | ICD-10-CM

## 2024-02-29 DIAGNOSIS — F4310 Post-traumatic stress disorder, unspecified: Secondary | ICD-10-CM | POA: Diagnosis not present

## 2024-03-02 ENCOUNTER — Encounter: Payer: Self-pay | Admitting: Nurse Practitioner

## 2024-03-02 ENCOUNTER — Ambulatory Visit: Admitting: Nurse Practitioner

## 2024-03-02 ENCOUNTER — Other Ambulatory Visit (HOSPITAL_COMMUNITY): Payer: Self-pay

## 2024-03-02 VITALS — BP 118/72 | HR 64 | Wt 212.0 lb

## 2024-03-02 DIAGNOSIS — R899 Unspecified abnormal finding in specimens from other organs, systems and tissues: Secondary | ICD-10-CM | POA: Diagnosis not present

## 2024-03-02 DIAGNOSIS — R7303 Prediabetes: Secondary | ICD-10-CM | POA: Diagnosis not present

## 2024-03-02 DIAGNOSIS — Z8639 Personal history of other endocrine, nutritional and metabolic disease: Secondary | ICD-10-CM | POA: Diagnosis not present

## 2024-03-02 DIAGNOSIS — Z6841 Body Mass Index (BMI) 40.0 and over, adult: Secondary | ICD-10-CM | POA: Diagnosis not present

## 2024-03-02 DIAGNOSIS — R030 Elevated blood-pressure reading, without diagnosis of hypertension: Secondary | ICD-10-CM | POA: Diagnosis not present

## 2024-03-02 DIAGNOSIS — F3341 Major depressive disorder, recurrent, in partial remission: Secondary | ICD-10-CM

## 2024-03-02 DIAGNOSIS — Z0000 Encounter for general adult medical examination without abnormal findings: Secondary | ICD-10-CM | POA: Diagnosis not present

## 2024-03-02 MED ORDER — METFORMIN HCL 500 MG PO TABS
500.0000 mg | ORAL_TABLET | Freq: Every day | ORAL | 1 refills | Status: AC
  Filled 2024-03-02: qty 90, 90d supply, fill #0
  Filled 2024-06-07: qty 90, 90d supply, fill #1

## 2024-03-02 MED ORDER — FREESTYLE LIBRE 3 PLUS SENSOR MISC
3 refills | Status: AC
  Filled 2024-03-02: qty 2, 30d supply, fill #0
  Filled 2024-03-28: qty 2, 30d supply, fill #1
  Filled 2024-04-27: qty 2, 30d supply, fill #2
  Filled 2024-06-07: qty 2, 30d supply, fill #3

## 2024-03-02 NOTE — Assessment & Plan Note (Signed)
 Nocturnal hypoglycemia with blood sugar levels previously recorded in the 40s on CGM multiple times. She was on a sample CGM prior to her bariatric surgery and has concerns that she may still be experiencing lows now that she is not eating as much.   Interested in using CGM again to monitor blood sugar levels more effectively and reduce risks associated with hypoglycemic episodes.  - Provide Freestyle CGM samples for monitoring - Order A1c test to assess glycemic control

## 2024-03-02 NOTE — Assessment & Plan Note (Signed)
 Reports significant improvement in situations in life that were causing distress. She reports things are going well and looking up. She does not feel that medication adjustment is needed. Will continue to monitor.

## 2024-03-02 NOTE — Progress Notes (Signed)
 Caroline Fennel, DNP, AGNP-c [facility]  [address] [place], [place] [address] [phone]  ESTABLISHED PATIENT- Chronic Health and/or Follow-Up Visit  Blood pressure 118/72, pulse 64, weight 212 lb (96.2 kg), last menstrual period 01/06/2021.    Caroline Gonzales is a 48 y.o. year old female presenting today for evaluation and management of chronic conditions.   History of Present Illness Caroline Gonzales is a 48 year old female who presents for follow-up after knee surgery and to discuss weight management and palpitations.  She is approximately 8-10 weeks post-meniscus repair following a fall at work after Thanksgiving. Since the surgery, she has been on seated duty and has recently resumed some physical activities, including a warm water  stretching class and gentle yoga. She reports doing much better mentally. No leg swelling is noted.  She has a history of palpitations, which were monitored with a Zeo monitor. After experiencing palpitations post-surgery, she visited the ER in March. Her cardiologist adjusted her propranolol  dosage from bedtime to three times a day. She wore a Zeo monitor for two weeks, twice, and is awaiting further follow-up. Her resting heart rate has dropped from previous levels of 90-100 bpm to 60-70 bpm. She experiences palpitations and shortness of breath, but no significant events were captured on the monitor.  She struggles with distinguishing hunger from cravings since her surgery. She previously used a continuous glucose monitor (CGM) to help identify true hunger based on glucose levels. She has been stuck at a weight of 208-210 pounds since December. No recent A1c test has been done. She has experienced low blood sugar levels at night in the past, with glucose dropping into the 40s.  She underwent bariatric surgery and has experienced significant weight loss, dropping from size 24-26 to size 14 jeans. She reports improved physical fitness  and is committed to regular exercise, including yoga and water  walking. She has adjusted her diet, avoiding caffeine, fizzy drinks, and alcohol , and focuses on high-protein foods.  She is on hormone replacement therapy with an estradiol  patch and reports no leg swelling, though she experiences some hand swelling in hot weather. She has also reduced her magnesium threonate dose due to elevated magnesium levels. All ROS negative with exception of what is listed above.   PHYSICAL EXAM Physical Exam Vitals and nursing note reviewed.  Constitutional:      Appearance: Normal appearance.  HENT:     Head: Normocephalic.   Eyes:     Pupils: Pupils are equal, round, and reactive to light.    Cardiovascular:     Rate and Rhythm: Normal rate and regular rhythm.     Pulses: Normal pulses.     Heart sounds: Normal heart sounds.  Pulmonary:     Effort: Pulmonary effort is normal.     Breath sounds: Normal breath sounds.   Musculoskeletal:        General: Normal range of motion.     Cervical back: Normal range of motion.     Right lower leg: No edema.     Left lower leg: No edema.   Skin:    General: Skin is warm.   Neurological:     General: No focal deficit present.     Mental Status: She is alert and oriented to person, place, and time.   Psychiatric:        Mood and Affect: Mood normal.      PLAN Problem List Items Addressed This Visit     Morbid obesity with body  mass index (BMI) of 50.0 to 59.9 in adult [facility])   Post-bariatric surgery with significant weight loss, improved physical activity, and satisfaction with the surgery. Adheres to dietary recommendations and reports positive lifestyle changes, including clothing size reduction and increased physical activity. - Continue dietary and lifestyle modifications - Monitor nutritional intake and supplement as needed      Relevant Medications   metFORMIN  (GLUCOPHAGE ) 500 MG tablet   Pre-diabetes   Significant weight loss  post operatively with suspected improvement in glycemic control. Labs pending today. Will start metformin  for ongoing management of blood sugar and weight control.       Relevant Medications   metFORMIN  (GLUCOPHAGE ) 500 MG tablet   Other Relevant Orders   Hemoglobin A1c   CBC with Differential/Platelet   Recurrent major depressive disorder, in partial remission (HCC)   Reports significant improvement in situations in life that were causing distress. She reports things are going well and looking up. She does not feel that medication adjustment is needed. Will continue to monitor.       RESOLVED: Elevated blood pressure reading without diagnosis of hypertension   Blood pressure fantastic today! No concerns.       History of nocturnal hypoglycemia - Primary   Nocturnal hypoglycemia with blood sugar levels previously recorded in the 40s on CGM multiple times. She was on a sample CGM prior to her bariatric surgery and has concerns that she may still be experiencing lows now that she is not eating as much.   Interested in using CGM again to monitor blood sugar levels more effectively and reduce risks associated with hypoglycemic episodes.  - Provide Freestyle CGM samples for monitoring - Order A1c test to assess glycemic control      Relevant Medications   Continuous Glucose Sensor (FREESTYLE LIBRE 3 PLUS SENSOR) MISC   Other Relevant Orders   Hemoglobin A1c   CBC with Differential/Platelet   Encounter for annual physical exam   CPE completed today. Review of HM activities and recommendations discussed and provided on AVS. Anticipatory guidance, diet, and exercise recommendations provided. Medications, allergies, and hx reviewed and updated as necessary. Orders placed as listed below.  Plan: - Labs ordered. Will make changes as necessary based on results.  - I will review these results and send recommendations via MyChart or a telephone call.  - F/U with CPE in 1 year or sooner for  acute/chronic health needs as directed.        Morbid obesity (HCC)   Relevant Medications   metFORMIN  (GLUCOPHAGE ) 500 MG tablet   Abnormal laboratory test   Relevant Orders   Iron, TIBC and Ferritin Panel    Return in about 1 year (around 03/02/2025) for CPE.  Caroline Fennel, DNP, AGNP-c

## 2024-03-02 NOTE — Assessment & Plan Note (Signed)
 Significant weight loss post operatively with suspected improvement in glycemic control. Labs pending today. Will start metformin  for ongoing management of blood sugar and weight control.

## 2024-03-02 NOTE — Assessment & Plan Note (Signed)

## 2024-03-02 NOTE — Assessment & Plan Note (Signed)
 Post-bariatric surgery with significant weight loss, improved physical activity, and satisfaction with the surgery. Adheres to dietary recommendations and reports positive lifestyle changes, including clothing size reduction and increased physical activity. - Continue dietary and lifestyle modifications - Monitor nutritional intake and supplement as needed

## 2024-03-02 NOTE — Patient Instructions (Signed)
 I am SO PROUD OF YOU!!!!!!!!!!!!!  Keep up the fantastic work!

## 2024-03-02 NOTE — Assessment & Plan Note (Signed)
 Blood pressure fantastic today! No concerns.

## 2024-03-03 ENCOUNTER — Other Ambulatory Visit: Payer: Self-pay

## 2024-03-03 ENCOUNTER — Other Ambulatory Visit (HOSPITAL_COMMUNITY): Payer: Self-pay

## 2024-03-03 LAB — CBC WITH DIFFERENTIAL/PLATELET
Basophils Absolute: 0 10*3/uL (ref 0.0–0.2)
Basos: 1 %
EOS (ABSOLUTE): 0.6 10*3/uL — ABNORMAL HIGH (ref 0.0–0.4)
Eos: 9 %
Hematocrit: 40.3 % (ref 34.0–46.6)
Hemoglobin: 13.2 g/dL (ref 11.1–15.9)
Immature Grans (Abs): 0 10*3/uL (ref 0.0–0.1)
Immature Granulocytes: 0 %
Lymphocytes Absolute: 2 10*3/uL (ref 0.7–3.1)
Lymphs: 32 %
MCH: 30.8 pg (ref 26.6–33.0)
MCHC: 32.8 g/dL (ref 31.5–35.7)
MCV: 94 fL (ref 79–97)
Monocytes Absolute: 0.7 10*3/uL (ref 0.1–0.9)
Monocytes: 11 %
Neutrophils Absolute: 3.1 10*3/uL (ref 1.4–7.0)
Neutrophils: 47 %
Platelets: 253 10*3/uL (ref 150–450)
RBC: 4.28 x10E6/uL (ref 3.77–5.28)
RDW: 12.8 % (ref 11.7–15.4)
WBC: 6.4 10*3/uL (ref 3.4–10.8)

## 2024-03-03 LAB — IRON,TIBC AND FERRITIN PANEL
Ferritin: 81 ng/mL (ref 15–150)
Iron Saturation: 21 % (ref 15–55)
Iron: 55 ug/dL (ref 27–159)
Total Iron Binding Capacity: 264 ug/dL (ref 250–450)
UIBC: 209 ug/dL (ref 131–425)

## 2024-03-03 LAB — HEMOGLOBIN A1C
Est. average glucose Bld gHb Est-mCnc: 103 mg/dL
Hgb A1c MFr Bld: 5.2 % (ref 4.8–5.6)

## 2024-03-06 ENCOUNTER — Encounter: Payer: Self-pay | Admitting: Physical Therapy

## 2024-03-06 ENCOUNTER — Ambulatory Visit: Admitting: Physical Therapy

## 2024-03-06 DIAGNOSIS — M6281 Muscle weakness (generalized): Secondary | ICD-10-CM

## 2024-03-06 DIAGNOSIS — M25561 Pain in right knee: Secondary | ICD-10-CM | POA: Diagnosis not present

## 2024-03-06 DIAGNOSIS — R2689 Other abnormalities of gait and mobility: Secondary | ICD-10-CM

## 2024-03-06 DIAGNOSIS — R600 Localized edema: Secondary | ICD-10-CM

## 2024-03-06 NOTE — Patient Instructions (Signed)

## 2024-03-06 NOTE — Therapy (Signed)
 OUTPATIENT PHYSICAL THERAPY LOWER EXTREMITY TREATMENT/ RE-CERTIFICATION   Patient Name: Caroline Gonzales MRN: [id number] DOB:[birth year], 48 y.o., female Today's Date: 03/06/2024  END OF SESSION:  PT End of Session - 03/06/24 1055     Visit Number 7    Number of Visits 10    Date for PT Re-Evaluation 04/17/24    Authorization Type WC    Authorization Time Period Auth for 2x week until 02/22/24 per appt notes    Authorization - Visit Number 7    Authorization - Number of Visits 10    PT Start Time 1056    Activity Tolerance Patient tolerated treatment well    Behavior During Therapy [facility] for tasks assessed/performed              Past Medical History:  Diagnosis Date   Acute costochondritis 11/14/2021   Allergy    Zyrtec .   Anxiety    followed by Dr. Melba   Back pain    Complication of anesthesia    PONV   Depression    Elevated blood pressure reading without diagnosis of hypertension 12/29/2021   Fibroid    Gastroparesis 09/14/2012   gastric emptying study in 2014   GERD (gastroesophageal reflux disease)    Headache    Hypertriglyceridemia 10/07/2017   Nightmares 07/21/2018   Pneumonia    2013ish   PONV (postoperative nausea and vomiting)     likes scopolamine  patch and zofran  /phenergan  helps   Pre-diabetes    PTSD (post-traumatic stress disorder)    Residual foreign body in soft tissue 11/12/2016   Screening for colon cancer 07/02/2022   Sleep apnea 07/01/2018   cpap optional, pt close to not use cpap   Vitamin D  deficiency    Wears glasses    Past Surgical History:  Procedure Laterality Date   ABDOMINAL HYSTERECTOMY  01/2021   ANKLE ARTHROSCOPY Left 2011   CESAREAN SECTION  06/2006   x 1   CHOLECYSTECTOMY  07/2006   laparoscopic   CYSTOSCOPY N/A 02/04/2021   Procedure: CYSTOSCOPY;  Surgeon: Jannis Kate Norris, MD;  Location: [facility] OR;  Service: Gynecology;  Laterality: N/A;   DILATION AND CURETTAGE OF UTERUS  1997   x 2   FOREIGN BODY REMOVAL  Left 11/18/2016   Procedure: REMOVAL FOREIGN BODY EXTREMITY LEFT FOOT;  Surgeon: Donnice JONELLE Fees, DPM;  Location: [facility] OR;  Service: Podiatry;  Laterality: Left;   GASTRIC ROUX-EN-Y N/A 04/13/2023   Procedure: LAPAROSCOPIC ROUX-EN-Y GASTRIC BYPASS WITH UPPER ENDOSCOPY;  Surgeon: Signe Mitzie LABOR, MD;  Location: [facility] ORS;  Service: General;  Laterality: N/A;   KNEE ARTHROSCOPY WITH MEDIAL MENISECTOMY Right 09/10/2021   Procedure: KNEE ARTHROSCOPY WITH MEDIAL MENISCAL ROOT REPAIR;  Surgeon: Cristy Bonner DASEN, MD;  Location: [facility];  Service: Orthopedics;  Laterality: Right;   PILONIDAL CYST EXCISION  1990's   RADIOLOGY WITH ANESTHESIA N/A 11/10/2018   Procedure: MRI WITH ANESTHESIA OF BRAIN AND ORBITS WITH AND WITHOUT CONTRAST;  Surgeon: Radiologist, Medication, MD;  Location: [facility] OR;  Service: Radiology;  Laterality: N/A;   TENDON REPAIR Left 2011   Left Ankle   TOTAL LAPAROSCOPIC HYSTERECTOMY WITH SALPINGECTOMY Bilateral 02/04/2021   Procedure: TOTAL LAPAROSCOPIC HYSTERECTOMY WITH SALPINGECTOMY;  Surgeon: Jannis Kate Norris, MD;  Location[facility] OR;  Service: Gynecology;  Laterality: Bilateral;   UPPER GASTROINTESTINAL ENDOSCOPY     WISDOM TOOTH EXTRACTION  1990's   Patient Active Problem List   Diagnosis Date Noted   History of nocturnal hypoglycemia 03/02/2024  Encounter for annual physical exam 03/02/2024   Family history of elevated lipoprotein (a) 04/26/2023   PVC's (premature ventricular contractions) 04/26/2023   Morbid obesity (HCC) 04/13/2023   Gastroesophageal reflux disease 12/24/2022   Abnormal laboratory test 12/24/2022   Chest wall pain 12/24/2022   Change in facial mole 12/29/2021   S/P laparoscopic hysterectomy 02/04/2021   PTSD (post-traumatic stress disorder) 07/21/2018   GAD (generalized anxiety disorder) 07/21/2018   Insomnia 07/21/2018   Recurrent major depressive disorder, in partial remission (HCC) 07/21/2018   Insulin  resistance 10/21/2017    Hypertriglyceridemia 10/07/2017   Vitamin D  deficiency 10/07/2017   Pre-diabetes 11/02/2016   Morbid obesity with body mass index (BMI) of 50.0 to 59.9 in adult (HCC) 10/15/2016    PCP: Oris Camie BRAVO, NP  REFERRING PROVIDER: Yvone Rush, MD   REFERRING DIAG: S/P R knee Scope  THERAPY DIAG:  Right knee pain, unspecified chronicity  Muscle weakness (generalized)  Other abnormalities of gait and mobility  Localized edema  Rationale for Evaluation and Treatment: Rehabilitation  ONSET DATE: 12/29/2023  SUBJECTIVE:   SUBJECTIVE STATEMENT: 03/06/2024  I feel like I am doing better in general, squatting or deep knee bending.     PERTINENT HISTORY: See PMHx PAIN:  Are you having pain? Yes: NPRS scale: 1/10 Pain location: inside of the knee Pain description: Stabbing, slicing Aggravating factors: House work, walking too much, bending the knee Relieving factors: Stop doing the activity   PRECAUTIONS: None  RED FLAGS: None   WEIGHT BEARING RESTRICTIONS: No  FALLS:  Has patient fallen in last 6 months? 1 fall that caused this in November  LIVING ENVIRONMENT: Lives with: lives with their family Lives in: House/apartment Stairs: Yes: External: 2 steps; none Has following equipment at home: Single point cane, Walker - 2 wheeled, Crutches, and shower chair  OCCUPATION: RN in oncology   PLOF: Independent  PATIENT GOALS: to be able to do house work, and job (lifting machines/ wraps). Get rid of the pain.   OBJECTIVE:  Note: Objective measures were completed at Evaluation unless otherwise noted.  DIAGNOSTIC FINDINGS:  11/29/23 MRI R knee WO contrast IMPRESSION: 1. Postsurgical changes from interval medial meniscus posterior horn repair near the root. New small oblique horizontal tear of the posterior horn medial to the site of repair. Increased extrusion of the body. 2. Early medial and patellofemoral compartment osteoarthritis.  PATIENT SURVEYS:  LEFS  35/80 02/21/24  54/80  COGNITION: Overall cognitive status: Within functional limits for tasks assessed     SENSATION: Not tested   POSTURE: rounded shoulders and forward head  PALPATION: TTP along the medial aspect of the patella and medial tibiofemoral joint line.   LOWER EXTREMITY ROM:  Active ROM Right eval Left eval Right 01/17/24  Hip flexion     Hip extension     Hip abduction     Hip adduction     Hip internal rotation     Hip external rotation     Knee flexion 125 P! 132 130  Knee extension 3  0  Ankle dorsiflexion     Ankle plantarflexion     Ankle inversion     Ankle eversion      (Blank rows = not tested)  LOWER EXTREMITY MMT:  MMT Right eval Left eval Right 01/31/24 Right 03/06/24  Hip flexion 4-/5 4/5    Hip extension      Hip abduction 4/5 4/5    Hip adduction      Hip internal rotation  Hip external rotation      Knee flexion 4/5 P! 5/5  4+/5  Knee extension 4/5 P! 5/5 4+ no pain 4+/5  Ankle dorsiflexion      Ankle plantarflexion      Ankle inversion      Ankle eversion       (Blank rows = not tested, P!= pain)  FUNCTIONAL TESTS:    GAIT: Distance walked: from waiting area to tx room Assistive device utilized: None Level of assistance: Complete Independence Comments: foot flat gait pattern with antalgic gait pattern                                                                                                                                 TREATMENT :  [facility] Adult PT Treatment:                                                DATE: 03/06/24 IASTM along the rectus femoris/ patellar tendon Nu-step L 5 x 5 min UE/LE Gait training x 180 ft Reviewed progress and plan moving forward  Trigger Point Dry Needling  Initial Treatment: Pt instructed on Dry Needling rational, procedures, and possible side effects. Pt instructed to expect mild to moderate muscle soreness later in the day and/or into the next day.  Pt instructed in methods to  reduce muscle soreness. Pt instructed to continue prescribed HEP. Patient was educated on signs and symptoms of infection and other risk factors and advised to seek medical attention should they occur.  Patient verbalized understanding of these instructions and education.   Patient Verbal Consent Given: Yes Education Handout Provided: Yes Muscles Treated: R quad  Electrical Stimulation Performed: Yes, Parameters: CPS level 20 x 6 min adjusting intensity PRN Treatment Response/Outcome: twitch response lengthening    [facility] Adult PT Treatment:                                                DATE: 02/21/24 Stair assessment on 6 inche steps IASTM along proximal patellar tendon Pre-wrap cho-pat strap applied Low load long duration superior patellar tendon stretch  Knee extension con bil/ ecc RLE only 10 # 2 x 12 Step ups on hip machine RLE 1 x 12 Leg press con bil/ ecc RLE 1 x 12 with 40#, 1 x 12 60# Reassessed steps, reduction in soreness noted. Reviewed techniques  [facility] Adult PT Treatment:                                                DATE:  02/17/24  Neuromuscular re-ed: Quad set x12  Quad set + SLR x10 SLS + 3 way tap (RLE stance) 2x8 laps  KT taping quad/patellar tendon + education on care/monitoring  Therapeutic Activity: STS x8 from lowest mat cues for trunk mechanics and reduced ant tib translation Staggered STS RLE bias x8 4 inch step up 3x5 cues for trunk mechanics and pacing Education on mechanics, activity modification, and relevant anatomy/physiology as pertains to functional mobility    HEP update + education/handout     PATIENT EDUCATION:  Education details: rationale for interventions, HEP  Person educated: Patient Education method: Explanation, Demonstration, Tactile cues, Verbal cues Education comprehension: verbalized understanding, returned demonstration, verbal cues required, tactile cues required, and needs further education     HOME EXERCISE PROGRAM: Access  Code: [email or web address] URL: [email or web address] Date: 02/18/2024 Prepared by: Alm Jenny  Exercises - Gastroc Stretch on Wall  - 1 x daily - 7 x weekly - 2 sets - 2 reps - 30 - 60 seconds hold - Seated Hamstring Stretch  - 1 x daily - 7 x weekly - 2 sets - 2 reps - 30 - 60 seconds  hold - Supine Quadriceps Stretch with Strap on Table  - 1 x daily - 7 x weekly - 3 reps - 30 hold - Standing Terminal Knee Extension at Wall with Ball  - 1 x daily - 7 x weekly - 2 sets - 10 reps - 5-10 sec hold - Sidelying Hip Abduction  - 1 x daily - 7 x weekly - 2 sets - 10 reps - Active straight leg raise  - 1 x daily - 7 x weekly - 2 sets - 10 reps - Sit to Stand  - 1 x daily - 7 x weekly - 2 sets - 8 reps - Forward Step Up  - 1 x daily - 7 x weekly - 2 sets - 5 reps  ASSESSMENT:  CLINICAL IMPRESSION: 03/06/2024 Caroline Gonzales arrives to PT noting conitnued improvement with knee ROM and strength. She states her biggest challenge is squatting with report of medial knee pain. Educated was provided and consent given for TPDN focusing on the R quad (combined with e-stim) and patellar tendon followed with IASTM techniques. She noted soreness in the R quad following TPDN. Continued working on muscle activation to help retrain the quad to work and promote blood flow. End of session she reported soreness in the quad from the TPDN but noted the knee felt better. Caroline Gonzales continues to make good progress with physical therapy progressing with goals and would benefit from utilizing the remainder of her visits that were authorized for 4 more visits to work on functional job specific strengthening and work toward discharge.     Evaluation Patient is a 48 y.o. F who was seen today for physical therapy evaluation and treatment for s/p   knee arthroscopy on 12/29/2023. She has functional knee ROM with total arc at 3-125 degrees with pain noted at end ranges, and pain noted with MMT, and weakness noted compared bil which is  expected. She has an antalgic gait pattern, and notes pain at the medial aspect of the knee/ patella. Additionally reports R low back pain which started the day of surgery and noted could be related to the positioning in surgery. She responded well with exercises in session requiring verbal cues/ demonstration for proper form/ technique. She would benefit from physical therapy to decrease R knee pain, maximize RLE strengthf or gait and balance, and improve her overall  function by addressing the deficits listed.   OBJECTIVE IMPAIRMENTS: Abnormal gait, decreased activity tolerance, decreased balance, decreased strength, increased edema, obesity, and pain.   ACTIVITY LIMITATIONS: lifting, bending, sitting, standing, squatting, stairs, and locomotion level  PARTICIPATION LIMITATIONS: shopping, community activity, occupation, and yard work  PERSONAL FACTORS: Time since onset of injury/illness/exacerbation and 1-2 comorbidities: hx of anxiety/ depression, multiple knee surgries are also affecting patient's functional outcome.   REHAB POTENTIAL: Excellent  CLINICAL DECISION MAKING: Evolving/moderate complexity  EVALUATION COMPLEXITY: Moderate   GOALS: Goals reviewed with patient? Yes  SHORT TERM GOALS: Target date: 02/02/2024  Pt to be IND with initial HEP for therapeutic progression Baseline: 01/31/24: performs some of HEP Goal status: MET 02/21/24  2.  Pt to verbalize/ demo efficient heel strike/ toe off pattern to reduce strain/ tension and maximize energy conservation Baseline:  01/31/24: Min antalgic pattern  Goal status: MET 02/21/24   3.  Pt to LEFS by >/= 10 points to demo improving function Baseline:  Goal status: MET 02/21/24  4.  Increase Quad strength to >/= 4/ 5 with no pain during testing for strength improvement Baseline:  01/28/24: 4+/5 Goal status: MET   LONG TERM GOALS: Target date: 04/03/2024   Increase RLE strength to >/= 4+/5 to promote knee/ patellar stability with WB  activities Baseline:  Goal status: MET  2.  Pt to be able to sit, stand or walk for >/= 60 min with no report of limitations andmax pain to </= 2/10 for functional endurance required for work related tasks.  Baseline:  Goal status: partially met  3.  Pt to be able to lift/ lower >/= 30# from the floor with </= 2/10 max pain for job specific requirements, and personal goals of performing house work with no limitations Baseline:  Goal status: ongoing  4.  Pt to improve LEFS score to >/= 55/80 to demonstrate improvement in function Baseline:  Goal status: ongoing   5.  Pt to be IND with all HEP and is able to maintain and progress current LOF IND Baseline:  Goal status: ongoing     PLAN:  PT FREQUENCY: 1-2x/week  PT DURATION: 8 weeks  PLANNED INTERVENTIONS: 97110-Therapeutic exercises, 97530- Therapeutic activity, [id number]- Neuromuscular re-education, 97535- Self Care, [id number]- Manual therapy, [id number]- Gait training, [id number]- Vasopneumatic device, [id number]- Ultrasound, 02966- Ionotophoresis 4mg /ml Dexamethasone , Patient/Family education, Taping, Dry Needling, Joint mobilization, Cryotherapy, and Moist heat  PLAN FOR NEXT SESSION: Review/ update HEP PRN. Patellar mob, gross knee/ hip strength. Gait training, vaso for pain / swelling PRN. Limited tolerance with laying supine due to R low back pain.  Send notes to [facility] Case manager. NO IONTO   Mosie Angus PT, DPT, LAT, ATC  03/06/24  12:01 PM

## 2024-03-07 ENCOUNTER — Ambulatory Visit: Payer: Self-pay | Admitting: Nurse Practitioner

## 2024-03-10 DIAGNOSIS — I493 Ventricular premature depolarization: Secondary | ICD-10-CM | POA: Diagnosis not present

## 2024-03-10 DIAGNOSIS — R002 Palpitations: Secondary | ICD-10-CM | POA: Diagnosis not present

## 2024-03-13 ENCOUNTER — Encounter: Payer: Self-pay | Admitting: Physical Therapy

## 2024-03-13 ENCOUNTER — Ambulatory Visit: Admitting: Physical Therapy

## 2024-03-13 DIAGNOSIS — M25561 Pain in right knee: Secondary | ICD-10-CM

## 2024-03-13 DIAGNOSIS — M6281 Muscle weakness (generalized): Secondary | ICD-10-CM

## 2024-03-13 NOTE — Therapy (Signed)
 OUTPATIENT PHYSICAL THERAPY LOWER EXTREMITY TREATMENT   Patient Name: Caroline Gonzales MRN: [id number] DOB:1976/06/09, 48 y.o., female Today's Date: 03/13/2024  END OF SESSION:  PT End of Session - 03/13/24 1453     Visit Number 8    Number of Visits 10    Date for PT Re-Evaluation 04/03/24    Authorization Type WC    Authorization Time Period Auth for 2x week until 02/22/24 per appt notes    Authorization - Visit Number 8    Authorization - Number of Visits 10    PT Start Time 0250    PT Stop Time 0330    PT Time Calculation (min) 40 min              Past Medical History:  Diagnosis Date   Acute costochondritis 11/14/2021   Allergy    Zyrtec .   Anxiety    followed by Dr. Melba   Back pain    Complication of anesthesia    PONV   Depression    Elevated blood pressure reading without diagnosis of hypertension 12/29/2021   Fibroid    Gastroparesis 09/14/2012   gastric emptying study in 2014   GERD (gastroesophageal reflux disease)    Headache    Hypertriglyceridemia 10/07/2017   Nightmares 07/21/2018   Pneumonia    2013ish   PONV (postoperative nausea and vomiting)     likes scopolamine  patch and zofran  /phenergan  helps   Pre-diabetes    PTSD (post-traumatic stress disorder)    Residual foreign body in soft tissue 11/12/2016   Screening for colon cancer 07/02/2022   Sleep apnea 07/01/2018   cpap optional, pt close to not use cpap   Vitamin D  deficiency    Wears glasses    Past Surgical History:  Procedure Laterality Date   ABDOMINAL HYSTERECTOMY  01/2021   ANKLE ARTHROSCOPY Left 2011   CESAREAN SECTION  06/2006   x 1   CHOLECYSTECTOMY  07/2006   laparoscopic   CYSTOSCOPY N/A 02/04/2021   Procedure: CYSTOSCOPY;  Surgeon: Jannis Kate Norris, MD;  Location: [facility] OR;  Service: Gynecology;  Laterality: N/A;   DILATION AND CURETTAGE OF UTERUS  1997   x 2   FOREIGN BODY REMOVAL Left 11/18/2016   Procedure: REMOVAL FOREIGN BODY EXTREMITY LEFT FOOT;   Surgeon: Donnice JONELLE Fees, DPM;  Location: [facility] OR;  Service: Podiatry;  Laterality: Left;   GASTRIC ROUX-EN-Y N/A 04/13/2023   Procedure: LAPAROSCOPIC ROUX-EN-Y GASTRIC BYPASS WITH UPPER ENDOSCOPY;  Surgeon: Signe Mitzie LABOR, MD;  Location: [facility] ORS;  Service: General;  Laterality: N/A;   KNEE ARTHROSCOPY WITH MEDIAL MENISECTOMY Right 09/10/2021   Procedure: KNEE ARTHROSCOPY WITH MEDIAL MENISCAL ROOT REPAIR;  Surgeon: Cristy Bonner DASEN, MD;  Location: [facility];  Service: Orthopedics;  Laterality: Right;   PILONIDAL CYST EXCISION  1990's   RADIOLOGY WITH ANESTHESIA N/A 11/10/2018   Procedure: MRI WITH ANESTHESIA OF BRAIN AND ORBITS WITH AND WITHOUT CONTRAST;  Surgeon: Radiologist, Medication, MD;  Location: [facility] OR;  Service: Radiology;  Laterality: N/A;   TENDON REPAIR Left 2011   Left Ankle   TOTAL LAPAROSCOPIC HYSTERECTOMY WITH SALPINGECTOMY Bilateral 02/04/2021   Procedure: TOTAL LAPAROSCOPIC HYSTERECTOMY WITH SALPINGECTOMY;  Surgeon: Jannis Kate Norris, MD;  Location: [facility] OR;  Service: Gynecology;  Laterality: Bilateral;   UPPER GASTROINTESTINAL ENDOSCOPY     WISDOM TOOTH EXTRACTION  1990's   Patient Active Problem List   Diagnosis Date Noted   History of nocturnal hypoglycemia 03/02/2024   Encounter for  annual physical exam 03/02/2024   Family history of elevated lipoprotein (a) 04/26/2023   PVC's (premature ventricular contractions) 04/26/2023   Morbid obesity (HCC) 04/13/2023   Gastroesophageal reflux disease 12/24/2022   Abnormal laboratory test 12/24/2022   Chest wall pain 12/24/2022   Change in facial mole 12/29/2021   S/P laparoscopic hysterectomy 02/04/2021   PTSD (post-traumatic stress disorder) 07/21/2018   GAD (generalized anxiety disorder) 07/21/2018   Insomnia 07/21/2018   Recurrent major depressive disorder, in partial remission (HCC) 07/21/2018   Insulin  resistance 10/21/2017   Hypertriglyceridemia 10/07/2017   Vitamin D  deficiency 10/07/2017    Pre-diabetes 11/02/2016   Morbid obesity with body mass index (BMI) of 50.0 to 59.9 in adult (HCC) 10/15/2016    PCP: Oris Camie BRAVO, NP  REFERRING PROVIDER: Yvone Rush, MD   REFERRING DIAG: S/P R knee Scope  THERAPY DIAG:  Right knee pain, unspecified chronicity  Muscle weakness (generalized)  Rationale for Evaluation and Treatment: Rehabilitation  ONSET DATE: 12/29/2023  SUBJECTIVE:   SUBJECTIVE STATEMENT: 03/13/2024  I feel like I am doing better in general, squatting or deep knee bending.     PERTINENT HISTORY: See PMHx PAIN:  Are you having pain? Yes: NPRS scale: 0/10 Pain location: inside of the knee Pain description: Stabbing, slicing Aggravating factors: House work, walking too much, bending the knee Relieving factors: Stop doing the activity   PRECAUTIONS: None  RED FLAGS: None   WEIGHT BEARING RESTRICTIONS: No  FALLS:  Has patient fallen in last 6 months? 1 fall that caused this in November  LIVING ENVIRONMENT: Lives with: lives with their family Lives in: House/apartment Stairs: Yes: External: 2 steps; none Has following equipment at home: Single point cane, Walker - 2 wheeled, Crutches, and shower chair  OCCUPATION: RN in oncology   PLOF: Independent  PATIENT GOALS: to be able to do house work, and job (lifting machines/ wraps). Get rid of the pain.   OBJECTIVE:  Note: Objective measures were completed at Evaluation unless otherwise noted.  DIAGNOSTIC FINDINGS:  11/29/23 MRI R knee WO contrast IMPRESSION: 1. Postsurgical changes from interval medial meniscus posterior horn repair near the root. New small oblique horizontal tear of the posterior horn medial to the site of repair. Increased extrusion of the body. 2. Early medial and patellofemoral compartment osteoarthritis.  PATIENT SURVEYS:  LEFS 35/80 02/21/24  54/80  COGNITION: Overall cognitive status: Within functional limits for tasks assessed     SENSATION: Not  tested   POSTURE: rounded shoulders and forward head  PALPATION: TTP along the medial aspect of the patella and medial tibiofemoral joint line.   LOWER EXTREMITY ROM:  Active ROM Right eval Left eval Right 01/17/24  Hip flexion     Hip extension     Hip abduction     Hip adduction     Hip internal rotation     Hip external rotation     Knee flexion 125 P! 132 130  Knee extension 3  0  Ankle dorsiflexion     Ankle plantarflexion     Ankle inversion     Ankle eversion      (Blank rows = not tested)  LOWER EXTREMITY MMT:  MMT Right eval Left eval Right 01/31/24 Right 03/06/24  Hip flexion 4-/5 4/5    Hip extension      Hip abduction 4/5 4/5    Hip adduction      Hip internal rotation      Hip external rotation  Knee flexion 4/5 P! 5/5  4+/5  Knee extension 4/5 P! 5/5 4+ no pain 4+/5  Ankle dorsiflexion      Ankle plantarflexion      Ankle inversion      Ankle eversion       (Blank rows = not tested, P!= pain)  FUNCTIONAL TESTS:    GAIT: Distance walked: from waiting area to tx room Assistive device utilized: None Level of assistance: Complete Independence Comments: foot flat gait pattern with antalgic gait pattern                                                                                                                                 TREATMENT :  [facility] Adult PT Treatment:                                                DATE: 03/13/24 Therapeutic Exercise: Prone quad stretch with strap x 60 sec  Supine hip flexor stretch  Therapeutic Activity: STS x 10 from lowest mat cues for trunk mechanics and reduced ant tib translation Staggered STS RLE back x 10 8 inch step up , runners - needs 1 UE touch  4 inch lateral step down, increased pain so disc.  STS x10 STS RLE back x 10 STS 15# Squat tap 15# x 9- increased right knee pain so disc  QS into towel  SLR 10 x 2  SL hip hinge at counter reaching to floor, I UE support x 5 each way - thigh pain,  so disc.      [facility] Adult PT Treatment:                                                DATE: 03/06/24 IASTM along the rectus femoris/ patellar tendon Nu-step L 5 x 5 min UE/LE Gait training x 180 ft Reviewed progress and plan moving forward  Trigger Point Dry Needling  Initial Treatment: Pt instructed on Dry Needling rational, procedures, and possible side effects. Pt instructed to expect mild to moderate muscle soreness later in the day and/or into the next day.  Pt instructed in methods to reduce muscle soreness. Pt instructed to continue prescribed HEP. Patient was educated on signs and symptoms of infection and other risk factors and advised to seek medical attention should they occur.  Patient verbalized understanding of these instructions and education.   Patient Verbal Consent Given: Yes Education Handout Provided: Yes Muscles Treated: R quad  Electrical Stimulation Performed: Yes, Parameters: CPS level 20 x 6 min adjusting intensity PRN Treatment Response/Outcome: twitch response lengthening    [facility] Adult PT Treatment:  DATE: 02/21/24 Stair assessment on 6 inche steps IASTM along proximal patellar tendon Pre-wrap cho-pat strap applied Low load long duration superior patellar tendon stretch  Knee extension con bil/ ecc RLE only 10 # 2 x 12 Step ups on hip machine RLE 1 x 12 Leg press con bil/ ecc RLE 1 x 12 with 40#, 1 x 12 60# Reassessed steps, reduction in soreness noted. Reviewed techniques  [facility] Adult PT Treatment:                                                DATE: 02/17/24  Neuromuscular re-ed: Quad set x12  Quad set + SLR x10 SLS + 3 way tap (RLE stance) 2x8 laps  KT taping quad/patellar tendon + education on care/monitoring  Therapeutic Activity: STS x8 from lowest mat cues for trunk mechanics and reduced ant tib translation Staggered STS RLE bias x8 4 inch step up 3x5 cues for trunk mechanics and  pacing Education on mechanics, activity modification, and relevant anatomy/physiology as pertains to functional mobility    HEP update + education/handout     PATIENT EDUCATION:  Education details: rationale for interventions, HEP  Person educated: Patient Education method: Explanation, Demonstration, Tactile cues, Verbal cues Education comprehension: verbalized understanding, returned demonstration, verbal cues required, tactile cues required, and needs further education     HOME EXERCISE PROGRAM: Access Code: [email or web address] URL: [email or web address] Date: 02/18/2024 Prepared by: Alm Jenny  Exercises - Gastroc Stretch on Wall  - 1 x daily - 7 x weekly - 2 sets - 2 reps - 30 - 60 seconds hold - Seated Hamstring Stretch  - 1 x daily - 7 x weekly - 2 sets - 2 reps - 30 - 60 seconds  hold - Supine Quadriceps Stretch with Strap on Table  - 1 x daily - 7 x weekly - 3 reps - 30 hold - Standing Terminal Knee Extension at Wall with Ball  - 1 x daily - 7 x weekly - 2 sets - 10 reps - 5-10 sec hold - Sidelying Hip Abduction  - 1 x daily - 7 x weekly - 2 sets - 10 reps - Active straight leg raise  - 1 x daily - 7 x weekly - 2 sets - 10 reps - Sit to Stand  - 1 x daily - 7 x weekly - 2 sets - 8 reps - Forward Step Up  - 1 x daily - 7 x weekly - 2 sets - 5 reps  ASSESSMENT:  CLINICAL IMPRESSION: 03/13/2024 Mrs Jerrell arrives to PT noting conitnued improvement with knee strength with less pain. Notes she was able to complete household chores and stairs without increased pain yesterday. TPDN caused increased soreness for 2 days however she feels improvement since that session. Today we worked on progressing functional strength and pt was able to complete STS with added 15#, attempted squat taps with 15# and pt experienced knee pain after 8 reps so discontinued. Worked on SLS and single leg supported hip hinge. After several reps she experienced right thigh pain so discontinued. Pt  encouraged to continue painfree HEP and avoid aggravating knee. Ended session with quad and hip flexor stretches. Will assess response next session and progress as tolerated.  Continued PT POC: work on functional job specific strengthening and work toward discharge.     Evaluation Patient is a 48 y.o.  F who was seen today for physical therapy evaluation and treatment for s/p   knee arthroscopy on 12/29/2023. She has functional knee ROM with total arc at 3-125 degrees with pain noted at end ranges, and pain noted with MMT, and weakness noted compared bil which is expected. She has an antalgic gait pattern, and notes pain at the medial aspect of the knee/ patella. Additionally reports R low back pain which started the day of surgery and noted could be related to the positioning in surgery. She responded well with exercises in session requiring verbal cues/ demonstration for proper form/ technique. She would benefit from physical therapy to decrease R knee pain, maximize RLE strengthf or gait and balance, and improve her overall function by addressing the deficits listed.   OBJECTIVE IMPAIRMENTS: Abnormal gait, decreased activity tolerance, decreased balance, decreased strength, increased edema, obesity, and pain.   ACTIVITY LIMITATIONS: lifting, bending, sitting, standing, squatting, stairs, and locomotion level  PARTICIPATION LIMITATIONS: shopping, community activity, occupation, and yard work  PERSONAL FACTORS: Time since onset of injury/illness/exacerbation and 1-2 comorbidities: hx of anxiety/ depression, multiple knee surgries are also affecting patient's functional outcome.   REHAB POTENTIAL: Excellent  CLINICAL DECISION MAKING: Evolving/moderate complexity  EVALUATION COMPLEXITY: Moderate   GOALS: Goals reviewed with patient? Yes  SHORT TERM GOALS: Target date: 02/02/2024  Pt to be IND with initial HEP for therapeutic progression Baseline: 01/31/24: performs some of HEP Goal status:  MET 02/21/24  2.  Pt to verbalize/ demo efficient heel strike/ toe off pattern to reduce strain/ tension and maximize energy conservation Baseline:  01/31/24: Min antalgic pattern  Goal status: MET 02/21/24   3.  Pt to LEFS by >/= 10 points to demo improving function Baseline:  Goal status: MET 02/21/24  4.  Increase Quad strength to >/= 4/ 5 with no pain during testing for strength improvement Baseline:  01/28/24: 4+/5 Goal status: MET   LONG TERM GOALS: Target date: 04/03/2024   Increase RLE strength to >/= 4+/5 to promote knee/ patellar stability with WB activities Baseline:  Goal status: MET  2.  Pt to be able to sit, stand or walk for >/= 60 min with no report of limitations andmax pain to </= 2/10 for functional endurance required for work related tasks.  Baseline:  Goal status: partially met  3.  Pt to be able to lift/ lower >/= 30# from the floor with </= 2/10 max pain for job specific requirements, and personal goals of performing house work with no limitations Baseline:  Goal status: ongoing  4.  Pt to improve LEFS score to >/= 55/80 to demonstrate improvement in function Baseline:  Goal status: ongoing   5.  Pt to be IND with all HEP and is able to maintain and progress current LOF IND Baseline:  Goal status: ongoing     PLAN:  PT FREQUENCY: 1-2x/week  PT DURATION: 8 weeks  PLANNED INTERVENTIONS: 97110-Therapeutic exercises, 97530- Therapeutic activity, [id number]- Neuromuscular re-education, 97535- Self Care, [id number]- Manual therapy, [id number]- Gait training, [id number]- Vasopneumatic device, [id number]- Ultrasound, 02966- Ionotophoresis 4mg /ml Dexamethasone , Patient/Family education, Taping, Dry Needling, Joint mobilization, Cryotherapy, and Moist heat  PLAN FOR NEXT SESSION: Review/ update HEP PRN. Patellar mob, gross knee/ hip strength. Gait training, vaso for pain / swelling PRN. Limited tolerance with laying supine due to R low back pain.  Send notes to [facility] Case manager. NO  CIRO Harlene Persons, [place] 03/13/24 3:39 PM Phone: [phone] Fax: [phone]

## 2024-03-15 ENCOUNTER — Other Ambulatory Visit: Payer: Self-pay

## 2024-03-15 ENCOUNTER — Other Ambulatory Visit (HOSPITAL_COMMUNITY): Payer: Self-pay

## 2024-03-16 ENCOUNTER — Other Ambulatory Visit (HOSPITAL_COMMUNITY): Payer: Self-pay

## 2024-03-16 MED ORDER — PANTOPRAZOLE SODIUM 40 MG PO TBEC
40.0000 mg | DELAYED_RELEASE_TABLET | Freq: Every day | ORAL | 0 refills | Status: DC
  Filled 2024-03-16: qty 90, 90d supply, fill #0

## 2024-03-20 ENCOUNTER — Other Ambulatory Visit: Payer: Self-pay | Admitting: Physician Assistant

## 2024-03-20 ENCOUNTER — Ambulatory Visit: Payer: Self-pay | Attending: Nurse Practitioner | Admitting: Physical Therapy

## 2024-03-20 ENCOUNTER — Encounter: Payer: Self-pay | Admitting: Physical Therapy

## 2024-03-20 ENCOUNTER — Ambulatory Visit: Admitting: Nurse Practitioner

## 2024-03-20 DIAGNOSIS — M6281 Muscle weakness (generalized): Secondary | ICD-10-CM | POA: Insufficient documentation

## 2024-03-20 DIAGNOSIS — M25561 Pain in right knee: Secondary | ICD-10-CM | POA: Diagnosis present

## 2024-03-20 NOTE — Therapy (Addendum)
 OUTPATIENT PHYSICAL THERAPY LOWER EXTREMITY TREATMENT PHYSICAL THERAPY DISCHARGE SUMMARY  Visits from Start of Care: 9  Current functional level related to goals / functional outcomes: See goals   Remaining deficits: Current status unknown due to pt not returning.    Education / Equipment: HEP, theraband, posture   Patient agrees to discharge. Patient goals were partially met. Patient is being discharged due to not returning since the last visit.  Joneen Fresh PT, DPT, LAT, ATC  05/24/24  2:47 PM        Patient Name: Caroline Gonzales MRN: [id number] DOB:1975-11-21, 48 y.o., female Today's Date: 03/20/2024  END OF SESSION:  PT End of Session - 03/20/24 1318     Visit Number 9    Number of Visits 10    Date for PT Re-Evaluation 04/03/24    Authorization Type WC    Authorization Time Period Auth for 2x week until 02/22/24 per appt notes    Authorization - Visit Number 9    Authorization - Number of Visits 10    PT Start Time 1315    PT Stop Time 1355    PT Time Calculation (min) 40 min              Past Medical History:  Diagnosis Date   Acute costochondritis 11/14/2021   Allergy    Zyrtec .   Anxiety    followed by Dr. Melba   Back pain    Complication of anesthesia    PONV   Depression    Elevated blood pressure reading without diagnosis of hypertension 12/29/2021   Fibroid    Gastroparesis 09/14/2012   gastric emptying study in 2014   GERD (gastroesophageal reflux disease)    Headache    Hypertriglyceridemia 10/07/2017   Nightmares 07/21/2018   Pneumonia    2013ish   PONV (postoperative nausea and vomiting)     likes scopolamine  patch and zofran  /phenergan  helps   Pre-diabetes    PTSD (post-traumatic stress disorder)    Residual foreign body in soft tissue 11/12/2016   Screening for colon cancer 07/02/2022   Sleep apnea 07/01/2018   cpap optional, pt close to not use cpap   Vitamin D  deficiency    Wears glasses    Past Surgical  History:  Procedure Laterality Date   ABDOMINAL HYSTERECTOMY  01/2021   ANKLE ARTHROSCOPY Left 2011   CESAREAN SECTION  06/2006   x 1   CHOLECYSTECTOMY  07/2006   laparoscopic   CYSTOSCOPY N/A 02/04/2021   Procedure: CYSTOSCOPY;  Surgeon: Jannis Kate Norris, MD;  Location: [facility] OR;  Service: Gynecology;  Laterality: N/A;   DILATION AND CURETTAGE OF UTERUS  1997   x 2   FOREIGN BODY REMOVAL Left 11/18/2016   Procedure: REMOVAL FOREIGN BODY EXTREMITY LEFT FOOT;  Surgeon: Donnice JONELLE Fees, DPM;  Location: [facility] OR;  Service: Podiatry;  Laterality: Left;   GASTRIC ROUX-EN-Y N/A 04/13/2023   Procedure: LAPAROSCOPIC ROUX-EN-Y GASTRIC BYPASS WITH UPPER ENDOSCOPY;  Surgeon: Signe Mitzie LABOR, MD;  Location: [facility] ORS;  Service: General;  Laterality: N/A;   KNEE ARTHROSCOPY WITH MEDIAL MENISECTOMY Right 09/10/2021   Procedure: KNEE ARTHROSCOPY WITH MEDIAL MENISCAL ROOT REPAIR;  Surgeon: Cristy Bonner DASEN, MD;  Location: [facility];  Service: Orthopedics;  Laterality: Right;   PILONIDAL CYST EXCISION  1990's   RADIOLOGY WITH ANESTHESIA N/A 11/10/2018   Procedure: MRI WITH ANESTHESIA OF BRAIN AND ORBITS WITH AND WITHOUT CONTRAST;  Surgeon: Radiologist, Medication, MD;  Location: [facility] OR;  Service: Radiology;  Laterality: N/A;   TENDON REPAIR Left 2011   Left Ankle   TOTAL LAPAROSCOPIC HYSTERECTOMY WITH SALPINGECTOMY Bilateral 02/04/2021   Procedure: TOTAL LAPAROSCOPIC HYSTERECTOMY WITH SALPINGECTOMY;  Surgeon: Jannis Kate Norris, MD;  Location: [facility] OR;  Service: Gynecology;  Laterality: Bilateral;   UPPER GASTROINTESTINAL ENDOSCOPY     WISDOM TOOTH EXTRACTION  1990's   Patient Active Problem List   Diagnosis Date Noted   History of nocturnal hypoglycemia 03/02/2024   Encounter for annual physical exam 03/02/2024   Family history of elevated lipoprotein (a) 04/26/2023   PVC's (premature ventricular contractions) 04/26/2023   Morbid obesity (HCC) 04/13/2023   Gastroesophageal reflux  disease 12/24/2022   Abnormal laboratory test 12/24/2022   Chest wall pain 12/24/2022   Change in facial mole 12/29/2021   S/P laparoscopic hysterectomy 02/04/2021   PTSD (post-traumatic stress disorder) 07/21/2018   GAD (generalized anxiety disorder) 07/21/2018   Insomnia 07/21/2018   Recurrent major depressive disorder, in partial remission (HCC) 07/21/2018   Insulin  resistance 10/21/2017   Hypertriglyceridemia 10/07/2017   Vitamin D  deficiency 10/07/2017   Pre-diabetes 11/02/2016   Morbid obesity with body mass index (BMI) of 50.0 to 59.9 in adult (HCC) 10/15/2016    PCP: Oris Camie BRAVO, NP  REFERRING PROVIDER: Yvone Rush, MD   REFERRING DIAG: S/P R knee Scope  THERAPY DIAG:  Right knee pain, unspecified chronicity  Muscle weakness (generalized)  Rationale for Evaluation and Treatment: Rehabilitation  ONSET DATE: 12/29/2023  SUBJECTIVE:   SUBJECTIVE STATEMENT:  03/20/24: Been able to go up and down the stairs. Had some difficulty with deep squat in yoga.     PERTINENT HISTORY: See PMHx PAIN:  Are you having pain? Yes: NPRS scale: 0/10 Pain location: inside of the knee Pain description: Stabbing, slicing Aggravating factors: House work, walking too much, bending the knee Relieving factors: Stop doing the activity   PRECAUTIONS: None  RED FLAGS: None   WEIGHT BEARING RESTRICTIONS: No  FALLS:  Has patient fallen in last 6 months? 1 fall that caused this in November  LIVING ENVIRONMENT: Lives with: lives with their family Lives in: House/apartment Stairs: Yes: External: 2 steps; none Has following equipment at home: Single point cane, Walker - 2 wheeled, Crutches, and shower chair  OCCUPATION: RN in oncology   PLOF: Independent  PATIENT GOALS: to be able to do house work, and job (lifting machines/ wraps). Get rid of the pain.   OBJECTIVE:  Note: Objective measures were completed at Evaluation unless otherwise noted.  DIAGNOSTIC FINDINGS:   11/29/23 MRI R knee WO contrast IMPRESSION: 1. Postsurgical changes from interval medial meniscus posterior horn repair near the root. New small oblique horizontal tear of the posterior horn medial to the site of repair. Increased extrusion of the body. 2. Early medial and patellofemoral compartment osteoarthritis.  PATIENT SURVEYS:  LEFS 35/80 02/21/24  54/80  COGNITION: Overall cognitive status: Within functional limits for tasks assessed     SENSATION: Not tested   POSTURE: rounded shoulders and forward head  PALPATION: TTP along the medial aspect of the patella and medial tibiofemoral joint line.   LOWER EXTREMITY ROM:  Active ROM Right eval Left eval Right 01/17/24  Hip flexion     Hip extension     Hip abduction     Hip adduction     Hip internal rotation     Hip external rotation     Knee flexion 125 P! 132 130  Knee extension 3  0  Ankle dorsiflexion  Ankle plantarflexion     Ankle inversion     Ankle eversion      (Blank rows = not tested)  LOWER EXTREMITY MMT:  MMT Right eval Left eval Right 01/31/24 Right 03/06/24  Hip flexion 4-/5 4/5    Hip extension      Hip abduction 4/5 4/5    Hip adduction      Hip internal rotation      Hip external rotation      Knee flexion 4/5 P! 5/5  4+/5  Knee extension 4/5 P! 5/5 4+ no pain 4+/5  Ankle dorsiflexion      Ankle plantarflexion      Ankle inversion      Ankle eversion       (Blank rows = not tested, P!= pain)  FUNCTIONAL TESTS:    GAIT: Distance walked: from waiting area to tx room Assistive device utilized: None Level of assistance: Complete Independence Comments: foot flat gait pattern with antalgic gait pattern                                                                                                                                 TREATMENT :  [facility] Adult PT Treatment:                                                DATE: 03/20/24  Neuromuscular re-ed: SLS with ABC drawing using  exercise ball - multiple LOB Static SLS trials 5-10 sec  Therapeutic Activity: Sled push and pull 30# 2 bouts in hallway  8 inch step up , runners - light touch x 12, no touch x 4  4 inch front step down taps without UE 2 x 10  Squat tap 10# x 10- min increased pain  SL hip hinge at counter reaching to cone, 1 UE support 10 x 2 each way - set flexed knee SLR 10 x 2      [facility] Adult PT Treatment:                                                DATE: 03/13/24 Therapeutic Exercise: Prone quad stretch with strap x 60 sec  Supine hip flexor stretch  Therapeutic Activity: STS x 10 from lowest mat cues for trunk mechanics and reduced ant tib translation Staggered STS RLE back x 10 8 inch step up , runners - needs 1 UE touch  4 inch lateral step down, increased pain so disc.  STS x10 STS RLE back x 10 STS 15# Squat tap 15# x 9- increased right knee pain so disc  QS into towel  SLR 10 x 2  SL hip hinge at  counter reaching to floor, I UE support x 5 each way - thigh pain, so disc.      [facility] Adult PT Treatment:                                                DATE: 03/06/24 IASTM along the rectus femoris/ patellar tendon Nu-step L 5 x 5 min UE/LE Gait training x 180 ft Reviewed progress and plan moving forward  Trigger Point Dry Needling  Initial Treatment: Pt instructed on Dry Needling rational, procedures, and possible side effects. Pt instructed to expect mild to moderate muscle soreness later in the day and/or into the next day.  Pt instructed in methods to reduce muscle soreness. Pt instructed to continue prescribed HEP. Patient was educated on signs and symptoms of infection and other risk factors and advised to seek medical attention should they occur.  Patient verbalized understanding of these instructions and education.   Patient Verbal Consent Given: Yes Education Handout Provided: Yes Muscles Treated: R quad  Electrical Stimulation Performed: Yes, Parameters: CPS level  20 x 6 min adjusting intensity PRN Treatment Response/Outcome: twitch response lengthening    [facility] Adult PT Treatment:                                                DATE: 02/21/24 Stair assessment on 6 inche steps IASTM along proximal patellar tendon Pre-wrap cho-pat strap applied Low load long duration superior patellar tendon stretch  Knee extension con bil/ ecc RLE only 10 # 2 x 12 Step ups on hip machine RLE 1 x 12 Leg press con bil/ ecc RLE 1 x 12 with 40#, 1 x 12 60# Reassessed steps, reduction in soreness noted. Reviewed techniques  [facility] Adult PT Treatment:                                                DATE: 02/17/24  Neuromuscular re-ed: Quad set x12  Quad set + SLR x10 SLS + 3 way tap (RLE stance) 2x8 laps  KT taping quad/patellar tendon + education on care/monitoring  Therapeutic Activity: STS x8 from lowest mat cues for trunk mechanics and reduced ant tib translation Staggered STS RLE bias x8 4 inch step up 3x5 cues for trunk mechanics and pacing Education on mechanics, activity modification, and relevant anatomy/physiology as pertains to functional mobility    HEP update + education/handout     PATIENT EDUCATION:  Education details: rationale for interventions, HEP  Person educated: Patient Education method: Explanation, Demonstration, Tactile cues, Verbal cues Education comprehension: verbalized understanding, returned demonstration, verbal cues required, tactile cues required, and needs further education     HOME EXERCISE PROGRAM: Access Code: [email or web address] URL: [email or web address] Date: 02/18/2024 Prepared by: Alm Jenny  Exercises - Gastroc Stretch on Wall  - 1 x daily - 7 x weekly - 2 sets - 2 reps - 30 - 60 seconds hold - Seated Hamstring Stretch  - 1 x daily - 7 x weekly - 2 sets - 2 reps - 30 - 60 seconds  hold - Supine Quadriceps Stretch with Strap on Table  -  1 x daily - 7 x weekly - 3 reps - 30 hold - Standing Terminal Knee  Extension at Wall with Ball  - 1 x daily - 7 x weekly - 2 sets - 10 reps - 5-10 sec hold - Sidelying Hip Abduction  - 1 x daily - 7 x weekly - 2 sets - 10 reps - Active straight leg raise  - 1 x daily - 7 x weekly - 2 sets - 10 reps - Sit to Stand  - 1 x daily - 7 x weekly - 2 sets - 8 reps - Forward Step Up  - 1 x daily - 7 x weekly - 2 sets - 5 reps  ASSESSMENT:  CLINICAL IMPRESSION: 03/20/2024 Caroline Gonzales arrives to PT noting conitnued improvement with daily activity and work duties. No issues with stairs. She did have some pain with deep squats in yoga and some flexed knee hip stretches. Today continued with functional strength and eccentric quad. Did well with step downs. Had increased pain after 10 reps of 10# KB squats. Worked on single leg stability and required light touch to maintain balance with dynamic movement.  Continue PT POC: work on functional job specific strengthening and work toward discharge on next visit.     Evaluation Patient is a 48 y.o. F who was seen today for physical therapy evaluation and treatment for s/p   knee arthroscopy on 12/29/2023. She has functional knee ROM with total arc at 3-125 degrees with pain noted at end ranges, and pain noted with MMT, and weakness noted compared bil which is expected. She has an antalgic gait pattern, and notes pain at the medial aspect of the knee/ patella. Additionally reports R low back pain which started the day of surgery and noted could be related to the positioning in surgery. She responded well with exercises in session requiring verbal cues/ demonstration for proper form/ technique. She would benefit from physical therapy to decrease R knee pain, maximize RLE strengthf or gait and balance, and improve her overall function by addressing the deficits listed.   OBJECTIVE IMPAIRMENTS: Abnormal gait, decreased activity tolerance, decreased balance, decreased strength, increased edema, obesity, and pain.   ACTIVITY LIMITATIONS:  lifting, bending, sitting, standing, squatting, stairs, and locomotion level  PARTICIPATION LIMITATIONS: shopping, community activity, occupation, and yard work  PERSONAL FACTORS: Time since onset of injury/illness/exacerbation and 1-2 comorbidities: hx of anxiety/ depression, multiple knee surgries are also affecting patient's functional outcome.   REHAB POTENTIAL: Excellent  CLINICAL DECISION MAKING: Evolving/moderate complexity  EVALUATION COMPLEXITY: Moderate   GOALS: Goals reviewed with patient? Yes  SHORT TERM GOALS: Target date: 02/02/2024  Pt to be IND with initial HEP for therapeutic progression Baseline: 01/31/24: performs some of HEP Goal status: MET 02/21/24  2.  Pt to verbalize/ demo efficient heel strike/ toe off pattern to reduce strain/ tension and maximize energy conservation Baseline:  01/31/24: Min antalgic pattern  Goal status: MET 02/21/24   3.  Pt to LEFS by >/= 10 points to demo improving function Baseline:  Goal status: MET 02/21/24  4.  Increase Quad strength to >/= 4/ 5 with no pain during testing for strength improvement Baseline:  01/28/24: 4+/5 Goal status: MET   LONG TERM GOALS: Target date: 04/03/2024   Increase RLE strength to >/= 4+/5 to promote knee/ patellar stability with WB activities Baseline:  Goal status: MET  2.  Pt to be able to sit, stand or walk for >/= 60 min with no report of limitations  and max pain to </= 2/10 for functional endurance required for work related tasks.  Baseline:  03/20/24: no issue with work, shopping, stairs  Goal status: MET   3.  Pt to be able to lift/ lower >/= 30# from the floor with </= 2/10 max pain for job specific requirements, and personal goals of performing house work with no limitations Baseline:  03/20/24: pain with 10#- 15#  Goal status: ongoing  4.  Pt to improve LEFS score to >/= 55/80 to demonstrate improvement in function Baseline: 35/80 02/21/24: 54/80 Goal status: ongoing   5.  Pt to be IND  with all HEP and is able to maintain and progress current LOF IND Baseline:  Goal status: ongoing     PLAN:  PT FREQUENCY: 1-2x/week  PT DURATION: 8 weeks  PLANNED INTERVENTIONS: 97110-Therapeutic exercises, 97530- Therapeutic activity, [id number]- Neuromuscular re-education, 97535- Self Care, 02859- Manual therapy, [id number]- Gait training, 97016- Vasopneumatic device, [id number]- Ultrasound, 02966- Ionotophoresis 4mg /ml Dexamethasone , Patient/Family education, Taping, Dry Needling, Joint mobilization, Cryotherapy, and Moist heat  PLAN FOR NEXT SESSION: Review/ update HEP PRN. Patellar mob, gross knee/ hip strength. Gait training, vaso for pain / swelling PRN. Limited tolerance with laying supine due to R low back pain.  Send notes to [facility] Case manager. NO CIRO Harlene Persons, [place] 03/20/24 3:57 PM Phone: [phone] Fax: [phone]

## 2024-03-21 ENCOUNTER — Other Ambulatory Visit (HOSPITAL_COMMUNITY): Payer: Self-pay

## 2024-03-21 MED ORDER — QUETIAPINE FUMARATE 200 MG PO TABS
400.0000 mg | ORAL_TABLET | Freq: Every day | ORAL | 0 refills | Status: DC
  Filled 2024-03-21: qty 180, 90d supply, fill #0

## 2024-03-27 ENCOUNTER — Other Ambulatory Visit (HOSPITAL_COMMUNITY): Payer: Self-pay

## 2024-03-27 ENCOUNTER — Encounter: Payer: Self-pay | Admitting: Skilled Nursing Facility1

## 2024-03-27 ENCOUNTER — Encounter: Payer: Self-pay | Admitting: Physician Assistant

## 2024-03-27 ENCOUNTER — Encounter: Attending: Nurse Practitioner | Admitting: Skilled Nursing Facility1

## 2024-03-27 ENCOUNTER — Ambulatory Visit (INDEPENDENT_AMBULATORY_CARE_PROVIDER_SITE_OTHER): Admitting: Family

## 2024-03-27 ENCOUNTER — Other Ambulatory Visit (HOSPITAL_BASED_OUTPATIENT_CLINIC_OR_DEPARTMENT_OTHER): Payer: Self-pay

## 2024-03-27 ENCOUNTER — Ambulatory Visit: Payer: Commercial Managed Care - PPO | Admitting: Physician Assistant

## 2024-03-27 ENCOUNTER — Encounter (HOSPITAL_BASED_OUTPATIENT_CLINIC_OR_DEPARTMENT_OTHER): Payer: Self-pay | Admitting: Family

## 2024-03-27 VITALS — BP 97/66 | HR 72 | Ht 65.0 in | Wt 210.0 lb

## 2024-03-27 DIAGNOSIS — G4733 Obstructive sleep apnea (adult) (pediatric): Secondary | ICD-10-CM | POA: Diagnosis not present

## 2024-03-27 DIAGNOSIS — I493 Ventricular premature depolarization: Secondary | ICD-10-CM

## 2024-03-27 DIAGNOSIS — F3341 Major depressive disorder, recurrent, in partial remission: Secondary | ICD-10-CM

## 2024-03-27 DIAGNOSIS — E669 Obesity, unspecified: Secondary | ICD-10-CM | POA: Diagnosis not present

## 2024-03-27 DIAGNOSIS — R002 Palpitations: Secondary | ICD-10-CM

## 2024-03-27 DIAGNOSIS — F411 Generalized anxiety disorder: Secondary | ICD-10-CM | POA: Diagnosis not present

## 2024-03-27 DIAGNOSIS — F4310 Post-traumatic stress disorder, unspecified: Secondary | ICD-10-CM

## 2024-03-27 DIAGNOSIS — Z83430 Family history of elevated lipoprotein(a): Secondary | ICD-10-CM | POA: Diagnosis not present

## 2024-03-27 DIAGNOSIS — G4700 Insomnia, unspecified: Secondary | ICD-10-CM

## 2024-03-27 DIAGNOSIS — F515 Nightmare disorder: Secondary | ICD-10-CM

## 2024-03-27 MED ORDER — PROPRANOLOL HCL 40 MG PO TABS
40.0000 mg | ORAL_TABLET | Freq: Three times a day (TID) | ORAL | 3 refills | Status: AC
  Filled 2024-03-27: qty 270, fill #0
  Filled 2024-03-28 – 2024-04-09 (×3): qty 270, 90d supply, fill #0
  Filled 2024-09-17: qty 270, 90d supply, fill #1

## 2024-03-27 MED ORDER — GABAPENTIN 300 MG PO CAPS
900.0000 mg | ORAL_CAPSULE | Freq: Every day | ORAL | 1 refills | Status: DC
  Filled 2024-03-27: qty 270, 90d supply, fill #0
  Filled 2024-07-13: qty 270, 90d supply, fill #1

## 2024-03-27 NOTE — Progress Notes (Signed)
 [facility]  Patient ID: Caroline Gonzales,  MRN: [id number]  PCP: Oris Camie BRAVO, NP  Date of Evaluation: 03/27/2024 Time spent:20 minutes  Chief Complaint:  Chief Complaint   Anxiety; Depression; Insomnia; Follow-up    HISTORY/CURRENT STATUS: HPI for routine follow-up.  See ROS and social history.  If it was not for these issues she would be doing well.  Patient is able to enjoy things.  Energy and motivation are good.  Work is going well.  Sleeps well most of the time. ADLs and personal hygiene are normal.   Denies any changes in concentration, making decisions, or remembering things.  Appetite has not changed.  Weight is stable.  She was recently restarted on metformin  to help prevent carb cravings.  She has plateaued on weight loss after she had bariatric surgery a year ago.  No mania, delirium, or psychosis.  Denies suicidal or homicidal thoughts.  Denies dizziness, syncope, seizures, numbness, tingling, tremor, tics, unsteady gait, slurred speech, confusion. Denies muscle or joint pain, stiffness, or dystonia.  Individual Medical History/ Review of Systems: Changes? :Yes    saw cardiology this morning. Propranolol  was increased this morning. Has PVCs.  She has discussed the Seroquel  with the cardiologist and they do not feel like it needs to be decreased or stopped.  Past medications for mental health diagnoses include: Prozac, Paxil, Effexor, Norpramin, Wellbutrin, doxepin, Elavil, Geodon, Abilify, Lexapro, Trileptal, Ambien, Lunesta, Sonata, trazodone, Restoril, Remeron, Seroquel , Klonopin , propranolol , Lamictal , Deplin, Latuda, Xanax  made her feel horrible, Ativan  didn't help, prazosin    Allergies: Bacitracin, Nsaids, Penicillins, Tamiflu [oseltamivir phosphate], Cephalosporins, and Latex  Current Medications:  Current Outpatient Medications:    Biotin 5 MG TABS, Take by mouth., Disp: , Rfl:    cetirizine  (ZYRTEC ) 10 MG tablet, Take 10 mg by mouth at bedtime.,  Disp: , Rfl:    Cholecalciferol (VITAMIN D3) 5000 units CAPS, Take 5,000 Units by mouth at bedtime., Disp: , Rfl:    clonazePAM  (KLONOPIN ) 0.5 MG tablet, Take 1 tablet (0.5 mg) by mouth every 6 hours as needed for anxiety.  (up to 4 tablets daily), Disp: 120 tablet, Rfl: 5   Continuous Glucose Sensor (FREESTYLE LIBRE 3 PLUS SENSOR) MISC, Change sensor every 15 days., Disp: 2 each, Rfl: 3   estradiol  (VIVELLE -DOT) 0.025 MG/24HR, Place 1 patch onto the skin 2 (two) times a week., Disp: 8 patch, Rfl: 12   fluvoxaMINE  (LUVOX ) 100 MG tablet, Take 1 tablet by mouth at bedtime. Take with 50 mg dose to equal 150 mg at bedtime, Disp: 90 tablet, Rfl: 3   fluvoxaMINE  (LUVOX ) 50 MG tablet, TAKE 1 TABLET BY MOUTH AT BEDTIME. TAKE IN ADDITION TO THE 100 MG TABLET TO EQUAL 150 MG TOTAL, Disp: 90 tablet, Rfl: 3   metFORMIN  (GLUCOPHAGE ) 500 MG tablet, Take 1 tablet (500 mg total) by mouth daily with a meal., Disp: 90 tablet, Rfl: 1   Multiple Vitamin (MULTIVITAMIN) capsule, Take 1 capsule by mouth daily. bariatric, Disp: , Rfl:    ondansetron  (ZOFRAN -ODT) 8 MG disintegrating tablet, Dissolve 1 tablet by mouth every 8 hours as needed for nausea., Disp: 30 tablet, Rfl: 1   pantoprazole  (PROTONIX ) 40 MG tablet, Take 1 tablet (40 mg total) by mouth daily., Disp: 90 tablet, Rfl: 0   Prasterone  (INTRAROSA ) 6.5 MG INST, Place 1 suppository vaginally at bedtime as needed., Disp: 30 each, Rfl: 12   prazosin  (MINIPRESS ) 5 MG capsule, Take 2 capsules (10 mg total) by mouth at bedtime., Disp: 180 capsule, Rfl:  3   propranolol  (INDERAL ) 40 MG tablet, Take 1 tablet (40 mg total) by mouth 3 (three) times daily., Disp: 270 tablet, Rfl: 3   QUEtiapine  (SEROQUEL ) 200 MG tablet, Take 2 tablets (400 mg total) by mouth at bedtime., Disp: 180 tablet, Rfl: 0   triamcinolone  (NASACORT ) 55 MCG/ACT AERO nasal inhaler, Place 1 spray into the nose at bedtime., Disp: , Rfl:    UNABLE TO FIND, Take 1 tablet by mouth at bedtime. Magtein, Disp: ,  Rfl:    gabapentin  (NEURONTIN ) 300 MG capsule, Take 3 capsules (900 mg total) by mouth at bedtime., Disp: 270 capsule, Rfl: 1 Medication Side Effects: none  Family Medical/ Social History: Changes?   Has a friend has stage IV breast cancer.   MENTAL HEALTH EXAM:  Last menstrual period 01/06/2021.There is no height or weight on file to calculate BMI.  General Appearance: Casual and Well Groomed  Eye Contact:  Good  Speech:  Clear and Coherent and Normal Rate  Volume:  Normal  Mood:  Euthymic  Affect:  Congruent  Thought Process:  Goal Directed and Descriptions of Associations: Circumstantial  Orientation:  Full (Time, Place, and Person)  Thought Content: Logical   Suicidal Thoughts:  No  Homicidal Thoughts:  No  Memory:  WNL  Judgement:  Good  Insight:  Good  Psychomotor Activity:  Normal  Concentration:  Concentration: Good and Attention Span: Good  Recall:  Good  Fund of Knowledge: Good  Language: Good  Assets:  Communication Skills Desire for Improvement Financial Resources/Insurance Housing Resilience Social Support Transportation Vocational/Educational  ADL's:  Intact  Cognition: WNL  Prognosis:  Good   PCP follows labs  DIAGNOSES:    ICD-10-CM   1. PTSD (post-traumatic stress disorder)  F43.10     2. Generalized anxiety disorder  F41.1     3. Insomnia, unspecified type  G47.00     4. Recurrent major depressive disorder, in partial remission (HCC)  F33.41     5. Nightmares  F51.5     6. Obstructive sleep apnea  G47.33       Receiving Psychotherapy: Yes  Heather Mask  RECOMMENDATIONS:  PDMP was reviewed.  Last Klonopin  filled 03/15/2024.  Gabapentin  filled 01/10/2024. I provided approximately  20 minutes of face to face time during this encounter, including time spent before and after the visit in records review, medical decision making, counseling pertinent to today's visit, and charting.   She is doing well as far as her mental health medications go  so no changes are needed.  Cardiology is not concerned about the Seroquel . We have tried to decrease the dose or even stop it in the past but she did not tolerate it well so there are no concerns about her being on it at this time.  Of course if that changes we can wean her off.  Continue Klonopin  0.5 mg, 1 p.o. 4 times daily as needed. (She takes 3 at bedtime routinely, and 1 every day prn.  Continue Luvox  150 mg nightly. Continue Gabapentin  300 mg, 3 at bedtime. Continue prazosin  5 mg, 2 po qhs.  Continue propranolol  40 mg, 1 p.o. tid.  Continue Seroquel  200 mg, 2 at bedtime.  Continue counseling with Heather Mask. Return in 6 months.  Verneita Cooks, PA-C

## 2024-03-27 NOTE — Patient Instructions (Addendum)
 Medication Instructions:  Your physician recommends that you continue on your current medications as directed. Please refer to the Current Medication list given to you today.  *If you need a refill on your cardiac medications before your next appointment, please call your pharmacy*  Lab Work: At [facility]: Fasting Lipid panel If you have labs (blood work) drawn today and your tests are completely normal, you will receive your results only by: MyChart Message (if you have MyChart) OR A paper copy in the mail If you have any lab test that is abnormal or we need to change your treatment, we will call you to review the results.  Testing/Procedures: Your physician has requested that you have an echocardiogram. Echocardiography is a painless test that uses sound waves to create images of your heart. It provides your doctor with information about the size and shape of your heart and how well your heart's chambers and valves are working. This procedure takes approximately one hour. There are no restrictions for this procedure. Please do NOT wear cologne, perfume, aftershave, or lotions (deodorant is allowed). Please arrive 15 minutes prior to your appointment time.  Please note: We ask at that you not bring children with you during ultrasound (echo/ vascular) testing. Due to room size and safety concerns, children are not allowed in the ultrasound rooms during exams. Our front office staff cannot provide observation of children in our lobby area while testing is being conducted. An adult accompanying a patient to their appointment will only be allowed in the ultrasound room at the discretion of the ultrasound technician under special circumstances. We apologize for any inconvenience.  Your physician has requested that you have a coronary calcium score performed. This is not covered by insurance and will be an out-of-pocket cost of approximately $99.   Follow-Up: At [facility], you and your health needs are our priority.  As part of our continuing mission to provide you with exceptional heart care, our providers are all part of one team.  This team includes your primary Cardiologist (physician) and Advanced Practice Providers or APPs (Physician Assistants and Nurse Practitioners) who all work together to provide you with the care you need, when you need it.  Your next appointment:   1 year(s)  Provider:   Shelda Bruckner, MD or Reche Finder, NP   We recommend signing up for the patient portal called MyChart.  Sign up information is provided on this After Visit Summary.  MyChart is used to connect with patients for Virtual Visits (Telemedicine).  Patients are able to view lab/test results, encounter notes, upcoming appointments, etc.  Non-urgent messages can be sent to your provider as well.   To learn more about what you can do with MyChart, go to [email or web address].

## 2024-03-27 NOTE — Progress Notes (Signed)
 Bariatric Nutrition Follow-Up Visit Medical Nutrition Therapy  Appt Start Time: 11:32   End Time: 11:50  Surgery date: 04/13/2023 Surgery type: RYGB  NUTRITION ASSESSMENT  Anthropometrics  Start weight at [facility]: 266.5 lbs (date: 12/22/2022)  Height: 65 in Weight today: pt declined    Clinical   Pharmacotherapy: History of weight loss medication used: Wegovy    Medical hx: GERD, hypercholesterolemia, obesity, vit D deficiency Medications: see list Labs:  Notable signs/symptoms: none noted Any previous deficiencies? No Bowel Habits: Every day to every other day no complaints   Body Composition Scale 04/27/2023 07/15/2023 01/24/2024  Current Body Weight 246.3 223.0 209  Total Body Fat % 44.4 41.8 40.2  Visceral Fat 15 13 12   Fat-Free Mass % 55.5 58.1 59.7   Total Body Water  % 42.2 43.5 44.3  Muscle-Mass lbs 32.4 32.1 31.9  BMI 40.6 36.8 34.5  Body Fat Displacement            Torso  lbs 67.8 57.7 52.1         Left Leg  lbs 13.5 11.5 10.4         Right Leg  lbs 13.5 11.5 10.4         Left Arm  lbs 6.7 5.7 5.2         Right Arm  lbs 6.7 5.7 5.2     Lifestyle & Dietary Hx  Pt states she is still doing Pt and recently joined [employer] doing classes yoga recently.  Pt states she has been taking the stairs more often as well. Pt states her son has been doing well recently.  Pt states her 1 year is coming and has not lost weight since January so that is on her mind.  Pt state she recently started metformin  to try and help with her appetite and cravings stating it is helping.  Pt sates she uses her CGM to help her understand cravings.  Pt states she realized she was having hot flashes which turned out to be low blood sugars.  Pt states she notices when she eats too fast she will get nauseous.   Estimated daily fluid intake: 65-70 oz Estimated daily protein intake: average of 60 g Supplements: multivitamin and calcium Current average weekly physical activity: ADL's, started  classes at [employer]   24-Hr Dietary Recall: eats from cafeteria at [employer] long First Meal: 2 SCRAMBLED EGG + OATMEAL + nuts Snack: small tortilla + cheese Second Meal: salad + chicken or leftovers  Snack: triscuit or rice cake + chicken packet Third Meal: jambalya some rice Snack: 2 oreos  Beverages: iced herbal peppermint tea, water   Post-Op Goals/ Signs/ Symptoms Using straws: no Drinking while eating: no Chewing/swallowing difficulties: no Changes in vision: no Changes to mood/headaches: no Hair loss/changes to skin/nails: no Difficulty focusing/concentrating: no Sweating: no Limb weakness: no Dizziness/lightheadedness: no Palpitations: no  Carbonated/caffeinated beverages: no N/V/D/C/Gas: some nausea when eating one more bit to fullness Abdominal pain: no Dumping syndrome: no    NUTRITION DIAGNOSIS  Overweight/obesity ([place]-3.3) related to past poor dietary habits and physical inactivity as evidenced by completed bariatric surgery and following dietary guidelines for continued weight loss and healthy nutrition status.     NUTRITION INTERVENTION Nutrition counseling (C-1) and education (E-2) to facilitate bariatric surgery goals, including: Body Acceptance Accepting your body as it is today is a crucial first step before starting any weight loss journey. Embracing body acceptance fosters kindness and patience, helping you build a healthier, more sustainable relationship with food and yourself.  Instead of focusing solely on the scale, tune into how your body feels and prioritize nourishing it with balanced meals that support your energy and wellbeing. Set goals that celebrate strength, stamina, and overall health rather than just weight. Remember, health is more than a number--it includes mental, emotional, and physical wellness. Treat yourself with compassion and celebrate every step of progress along the way.  Goals Continue to eat often throughout the day to avoid  hypoglycemia and reduction in emotional eating   Handouts Previously Provided Include  Standard Prep Plan Advancement Guide  Learning Style & Readiness for Change Teaching method utilized: Visual & Auditory  Demonstrated degree of understanding via: Teach Back  Readiness Level: action Barriers to learning/adherence to lifestyle change: emotional eating   RD's Notes for Next Visit Assess adherence to pt chosen goals  MONITORING & EVALUATION Dietary intake, weekly physical activity, body weight.  Next Steps Patient is to follow-up in 2-3 months

## 2024-03-27 NOTE — Progress Notes (Signed)
 Cardiology Office Note   Date:  03/27/2024  ID:  Caroline Gonzales, DOB 30-Dec-1975, MRN [id number] PCP: Oris Camie BRAVO, NP  [facility] Providers Cardiologist:  Shelda Bruckner, MD Cardiology APP:  Vannie Reche RAMAN, NP     History of Present Illness Caroline Gonzales is a 48 y.o. female with hx of  palpitations, PVC, aortic atherosclerosis, weight loss surgery  Discussed the use of AI scribe software for clinical note transcription with the patient, who gave verbal consent to proceed.  History of Present Illness Last seen 01/28/24 with sensation of feeling she was kicked in the chest followed by PVC. Propranolol  timing adjusted to 7:30A and 3:30P. Sed rate,  bmet, mag unremarkable. CRP 4.93. 14 day ZIO (worn as two parts) predominantly NSR, two runs of SVT longest 8 seconds which was asymptomatic, average HR 63 and 65 bpm, PVC/PAC <1%.  Presents today for follow up. 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 She experiences palpitations that have improved with propranolol , taken three times daily.  She reports increased tiredness, possibly due to propranolol .  She underwent an echocardiogram in December 2023. She has not checked her cholesterol since April 2023. She identifies stress and caffeine as potential triggers for her palpitations and finds it challenging to stay hydrated on non-work days.   Reports no recurrent chest pain, pressure, discomfort.  Does note concerns related to her family history of cardiovascular disease and interested in screening.  ROS: Please see the history of present illness.    All other systems reviewed and are negative.   Studies Reviewed      Cardiac Studies & Procedures   ______________________________________________________________________________________________     ECHOCARDIOGRAM  ECHOCARDIOGRAM COMPLETE 08/28/2022  Narrative ECHOCARDIOGRAM REPORT    Patient Name:   Caroline Gonzales Date of Exam: 08/28/2022 Medical Rec #:   [id number]         Height:       65.0 in Accession #:    [id number]        Weight:       261.0 lb Date of Birth:  10/01/1975         BSA:          2.215 m Patient Age:    46 years          BP:           116/70 mmHg Patient Gender: F                 HR:           79 bpm. Exam Location:  [address]  Procedure: 2D Echo, Cardiac Doppler and Color Doppler  Indications:    R94.31 Abnormal EKG  History:        Patient has no prior history of Echocardiogram examinations. Arrythmias:PVC; Risk Factors:Hypertension, Dyslipidemia and Sleep Apnea. Palpitations. Pre-diabetes. Morbid obesity.  Sonographer:    Jon Hacker RCS Referring Phys: [phone] Caroline Gonzales  IMPRESSIONS   1. Left ventricular ejection fraction, by estimation, is 60 to 65%. The left ventricle has normal function. The left ventricle has no regional wall motion abnormalities. Left ventricular diastolic parameters were normal. 2. Right ventricular systolic function is normal. The right ventricular size is normal. 3. The mitral valve is normal in structure. Trivial mitral valve regurgitation. No evidence of mitral stenosis. 4. The aortic valve is normal in structure. Aortic valve regurgitation is not visualized. No aortic stenosis is present. 5. The inferior vena cava is normal in size with  greater than 50% respiratory variability, suggesting right atrial pressure of 3 mmHg.  FINDINGS Left Ventricle: Left ventricular ejection fraction, by estimation, is 60 to 65%. The left ventricle has normal function. The left ventricle has no regional wall motion abnormalities. The left ventricular internal cavity size was normal in size. There is no left ventricular hypertrophy. Left ventricular diastolic parameters were normal. Normal left ventricular filling pressure.  Right Ventricle: The right ventricular size is normal. No increase in right ventricular wall thickness. Right ventricular systolic function is normal.  Left Atrium: Left  atrial size was normal in size.  Right Atrium: Right atrial size was normal in size.  Pericardium: There is no evidence of pericardial effusion.  Mitral Valve: The mitral valve is normal in structure. Trivial mitral valve regurgitation. No evidence of mitral valve stenosis.  Tricuspid Valve: The tricuspid valve is normal in structure. Tricuspid valve regurgitation is trivial. No evidence of tricuspid stenosis.  Aortic Valve: The aortic valve is normal in structure. Aortic valve regurgitation is not visualized. No aortic stenosis is present.  Pulmonic Valve: The pulmonic valve was normal in structure. Pulmonic valve regurgitation is not visualized. No evidence of pulmonic stenosis.  Aorta: The aortic root is normal in size and structure.  Venous: The inferior vena cava is normal in size with greater than 50% respiratory variability, suggesting right atrial pressure of 3 mmHg.  IAS/Shunts: No atrial level shunt detected by color flow Doppler.   LEFT VENTRICLE PLAX 2D LVIDd:         4.40 cm   Diastology LVIDs:         2.70 cm   LV e' medial:    9.25 cm/s LV PW:         1.00 cm   LV E/e' medial:  9.2 LV IVS:        1.00 cm   LV e' lateral:   16.30 cm/s LVOT diam:     1.90 cm   LV E/e' lateral: 5.2 LV SV:         66 LV SV Index:   30 LVOT Area:     2.84 cm   RIGHT VENTRICLE RV Basal diam:  2.10 cm RV Mid diam:    1.90 cm RV S prime:     13.10 cm/s TAPSE (M-mode): 2.0 cm  LEFT ATRIUM             Index        RIGHT ATRIUM           Index LA diam:        4.10 cm 1.85 cm/m   RA Area:     11.30 cm LA Vol (A2C):   32.9 ml 14.85 ml/m  RA Volume:   19.70 ml  8.89 ml/m LA Vol (A4C):   48.1 ml 21.71 ml/m LA Biplane Vol: 40.6 ml 18.33 ml/m AORTIC VALVE LVOT Vmax:   109.00 cm/s LVOT Vmean:  71.100 cm/s LVOT VTI:    0.232 m  AORTA Ao Root diam: 2.90 cm Ao Asc diam:  2.90 cm  MITRAL VALVE MV Area (PHT): 4.39 cm    SHUNTS MV Decel Time: 173 msec    Systemic VTI:  0.23 m MV  E velocity: 84.90 cm/s  Systemic Diam: 1.90 cm MV A velocity: 65.50 cm/s MV E/A ratio:  1.30  Wilbert Bihari MD Electronically signed by Wilbert Bihari MD Signature Date/Time: 08/28/2022/1:45:10 PM    Final    MONITORS  LONG TERM MONITOR (3-14 DAYS) 02/17/2024  Narrative Patch Wear Time:  13 days and 15 hours (2025-05-16T15:46:19-0400 to 2025-05-30T07:26:01-399)  Patient had a min HR of 48 bpm, max HR of 133 bpm, and avg HR of 65 bpm. Predominant underlying rhythm was Sinus Rhythm. 2 Supraventricular Tachycardia runs occurred, the run with the fastest interval lasting 16 beats with a max rate of 133 bpm (avg 124 bpm); the run with the fastest interval was also the longest. No VT, atrial fibrillation, high degree block, or pauses noted. Isolated atrial and ventricular ectopy was rare (<1%). There were 22 triggered events, which were sinus rhythm. No high risk arrhythmias detected.       ______________________________________________________________________________________________      Risk Assessment/Calculations           Physical Exam VS:  BP 97/66 (BP Location: Left Arm, Patient Position: Sitting, Cuff Size: Normal)   Pulse 72   Ht 5' 5 (1.651 m)   Wt 210 lb (95.3 kg)   LMP 01/06/2021   SpO2 96%   BMI 34.95 kg/m        Wt Readings from Last 3 Encounters:  03/27/24 210 lb (95.3 kg)  03/02/24 212 lb (96.2 kg)  01/28/24 212 lb (96.2 kg)    GEN: Well nourished, well developed in no acute distress NECK: No JVD; No carotid bruits CARDIAC: RRR, no murmurs, rubs, gallops RESPIRATORY:  Clear to auscultation without rales, wheezing or rhonchi  ABDOMEN: Soft, non-tender, non-distended EXTREMITIES:  No edema; No deformity   ASSESSMENT AND PLAN  PVC / Palpitaions - recent monitor unremarkable. Continue Propranolol  20mg  TID as reports symptoms improvement. Continues to avoid caffeine, etoh. Plan for echocardiogram to rule out any structural abnormalities.  Family history of  cardiovascular disease - plan for coronary calcium score. Update lipid panel, LFTs.          Dispo: follow up in 1 year  Signed, Reche GORMAN Finder, NP

## 2024-03-28 ENCOUNTER — Other Ambulatory Visit (HOSPITAL_BASED_OUTPATIENT_CLINIC_OR_DEPARTMENT_OTHER): Payer: Self-pay

## 2024-03-28 ENCOUNTER — Other Ambulatory Visit: Payer: Self-pay

## 2024-03-28 ENCOUNTER — Other Ambulatory Visit (HOSPITAL_COMMUNITY): Payer: Self-pay

## 2024-03-29 ENCOUNTER — Other Ambulatory Visit (HOSPITAL_COMMUNITY): Payer: Self-pay

## 2024-03-30 ENCOUNTER — Ambulatory Visit: Admitting: Physical Therapy

## 2024-03-31 ENCOUNTER — Other Ambulatory Visit: Payer: Self-pay

## 2024-03-31 DIAGNOSIS — M25511 Pain in right shoulder: Secondary | ICD-10-CM | POA: Diagnosis not present

## 2024-04-04 DIAGNOSIS — F4310 Post-traumatic stress disorder, unspecified: Secondary | ICD-10-CM | POA: Diagnosis not present

## 2024-04-05 DIAGNOSIS — Z83430 Family history of elevated lipoprotein(a): Secondary | ICD-10-CM | POA: Diagnosis not present

## 2024-04-06 ENCOUNTER — Ambulatory Visit (HOSPITAL_BASED_OUTPATIENT_CLINIC_OR_DEPARTMENT_OTHER): Payer: Self-pay | Admitting: Family

## 2024-04-06 DIAGNOSIS — E782 Mixed hyperlipidemia: Secondary | ICD-10-CM

## 2024-04-06 LAB — LIPID PANEL
Chol/HDL Ratio: 3.5 ratio (ref 0.0–4.4)
Cholesterol, Total: 177 mg/dL (ref 100–199)
HDL: 50 mg/dL (ref >39)
LDL Chol Calc (NIH): 98 mg/dL (ref 0–99)
Triglycerides: 170 mg/dL — ABNORMAL HIGH (ref 0–149)
VLDL Cholesterol Cal: 29 mg/dL (ref 5–40)

## 2024-04-06 LAB — HEPATIC FUNCTION PANEL
ALT: 18 IU/L (ref 0–32)
AST: 20 IU/L (ref 0–40)
Albumin: 4.1 g/dL (ref 3.9–4.9)
Alkaline Phosphatase: 126 IU/L — ABNORMAL HIGH (ref 44–121)
Bilirubin Total: 0.3 mg/dL (ref 0.0–1.2)
Bilirubin, Direct: 0.1 mg/dL (ref 0.00–0.40)
Total Protein: 7.4 g/dL (ref 6.0–8.5)

## 2024-04-07 ENCOUNTER — Other Ambulatory Visit (HOSPITAL_COMMUNITY): Payer: Self-pay

## 2024-04-07 MED ORDER — ROSUVASTATIN CALCIUM 5 MG PO TABS
5.0000 mg | ORAL_TABLET | Freq: Every day | ORAL | 1 refills | Status: DC
  Filled 2024-04-07: qty 90, 90d supply, fill #0
  Filled 2024-07-01: qty 90, 90d supply, fill #1

## 2024-04-10 ENCOUNTER — Other Ambulatory Visit: Payer: Self-pay

## 2024-04-10 ENCOUNTER — Other Ambulatory Visit (HOSPITAL_COMMUNITY): Payer: Self-pay

## 2024-04-11 ENCOUNTER — Ambulatory Visit (HOSPITAL_BASED_OUTPATIENT_CLINIC_OR_DEPARTMENT_OTHER)
Admission: RE | Admit: 2024-04-11 | Discharge: 2024-04-11 | Disposition: A | Discharge status: Home / Self Care | Payer: Self-pay | Source: Ambulatory Visit | Attending: Family | Admitting: Family

## 2024-04-11 DIAGNOSIS — Z83430 Family history of elevated lipoprotein(a): Secondary | ICD-10-CM | POA: Insufficient documentation

## 2024-04-12 DIAGNOSIS — M25511 Pain in right shoulder: Secondary | ICD-10-CM | POA: Diagnosis not present

## 2024-04-17 ENCOUNTER — Ambulatory Visit (HOSPITAL_BASED_OUTPATIENT_CLINIC_OR_DEPARTMENT_OTHER): Payer: Self-pay | Admitting: Family

## 2024-04-17 DIAGNOSIS — R002 Palpitations: Secondary | ICD-10-CM | POA: Diagnosis not present

## 2024-04-17 DIAGNOSIS — I493 Ventricular premature depolarization: Secondary | ICD-10-CM | POA: Diagnosis not present

## 2024-04-18 DIAGNOSIS — F4310 Post-traumatic stress disorder, unspecified: Secondary | ICD-10-CM | POA: Diagnosis not present

## 2024-04-20 ENCOUNTER — Other Ambulatory Visit: Payer: Self-pay | Admitting: Physician Assistant

## 2024-04-21 ENCOUNTER — Other Ambulatory Visit (HOSPITAL_COMMUNITY): Payer: Self-pay

## 2024-04-21 MED ORDER — CLONAZEPAM 0.5 MG PO TABS
0.5000 mg | ORAL_TABLET | Freq: Four times a day (QID) | ORAL | 4 refills | Status: AC | PRN
  Filled 2024-04-21: qty 120, 30d supply, fill #0
  Filled 2024-05-31: qty 120, 30d supply, fill #1
  Filled 2024-07-13: qty 120, 30d supply, fill #2
  Filled 2024-08-14: qty 120, 30d supply, fill #3
  Filled 2024-09-16 – 2024-09-22 (×2): qty 120, 30d supply, fill #4

## 2024-04-25 DIAGNOSIS — F4310 Post-traumatic stress disorder, unspecified: Secondary | ICD-10-CM | POA: Diagnosis not present

## 2024-04-26 ENCOUNTER — Ambulatory Visit (HOSPITAL_BASED_OUTPATIENT_CLINIC_OR_DEPARTMENT_OTHER)

## 2024-04-26 DIAGNOSIS — I493 Ventricular premature depolarization: Secondary | ICD-10-CM

## 2024-04-26 LAB — ECHOCARDIOGRAM COMPLETE
Area-P 1/2: 3.37 cm2
S' Lateral: 2.28 cm

## 2024-04-27 ENCOUNTER — Other Ambulatory Visit: Payer: Self-pay

## 2024-05-04 DIAGNOSIS — M25511 Pain in right shoulder: Secondary | ICD-10-CM | POA: Diagnosis not present

## 2024-05-09 DIAGNOSIS — F4310 Post-traumatic stress disorder, unspecified: Secondary | ICD-10-CM | POA: Diagnosis not present

## 2024-05-11 ENCOUNTER — Other Ambulatory Visit (HOSPITAL_COMMUNITY): Payer: Self-pay

## 2024-05-11 DIAGNOSIS — M75111 Incomplete rotator cuff tear or rupture of right shoulder, not specified as traumatic: Secondary | ICD-10-CM | POA: Diagnosis not present

## 2024-05-11 MED ORDER — HYDROCODONE-ACETAMINOPHEN 5-325 MG PO TABS
1.0000 | ORAL_TABLET | ORAL | 0 refills | Status: DC | PRN
  Filled 2024-05-11: qty 30, 5d supply, fill #0

## 2024-05-17 DIAGNOSIS — M75121 Complete rotator cuff tear or rupture of right shoulder, not specified as traumatic: Secondary | ICD-10-CM | POA: Diagnosis not present

## 2024-05-17 DIAGNOSIS — M25511 Pain in right shoulder: Secondary | ICD-10-CM | POA: Diagnosis not present

## 2024-05-19 ENCOUNTER — Other Ambulatory Visit (HOSPITAL_COMMUNITY): Payer: Self-pay

## 2024-05-19 DIAGNOSIS — F4310 Post-traumatic stress disorder, unspecified: Secondary | ICD-10-CM | POA: Diagnosis not present

## 2024-05-19 MED ORDER — CYCLOBENZAPRINE HCL 5 MG PO TABS
5.0000 mg | ORAL_TABLET | Freq: Three times a day (TID) | ORAL | 1 refills | Status: AC | PRN
  Filled 2024-05-19: qty 40, 14d supply, fill #0
  Filled 2024-06-07: qty 40, 14d supply, fill #1

## 2024-05-19 MED ORDER — HYDROCODONE-ACETAMINOPHEN 5-325 MG PO TABS
1.0000 | ORAL_TABLET | ORAL | 0 refills | Status: DC | PRN
  Filled 2024-05-19: qty 30, 5d supply, fill #0

## 2024-05-23 DIAGNOSIS — F4310 Post-traumatic stress disorder, unspecified: Secondary | ICD-10-CM | POA: Diagnosis not present

## 2024-05-30 DIAGNOSIS — F4310 Post-traumatic stress disorder, unspecified: Secondary | ICD-10-CM | POA: Diagnosis not present

## 2024-05-31 ENCOUNTER — Other Ambulatory Visit: Payer: Self-pay

## 2024-06-06 DIAGNOSIS — F4310 Post-traumatic stress disorder, unspecified: Secondary | ICD-10-CM | POA: Diagnosis not present

## 2024-06-07 ENCOUNTER — Other Ambulatory Visit (HOSPITAL_COMMUNITY): Payer: Self-pay

## 2024-06-07 ENCOUNTER — Other Ambulatory Visit: Payer: Self-pay

## 2024-06-07 MED ORDER — PANTOPRAZOLE SODIUM 40 MG PO TBEC
40.0000 mg | DELAYED_RELEASE_TABLET | Freq: Every day | ORAL | 0 refills | Status: DC
  Filled 2024-06-07 – 2024-06-23 (×2): qty 90, 90d supply, fill #0

## 2024-06-08 ENCOUNTER — Other Ambulatory Visit (HOSPITAL_COMMUNITY): Payer: Self-pay

## 2024-06-08 MED ORDER — HYDROCODONE-ACETAMINOPHEN 5-325 MG PO TABS
1.0000 | ORAL_TABLET | ORAL | 0 refills | Status: DC
  Filled 2024-06-08: qty 30, 5d supply, fill #0

## 2024-06-09 ENCOUNTER — Encounter: Payer: Self-pay | Admitting: Physical Therapy

## 2024-06-09 ENCOUNTER — Encounter (HOSPITAL_BASED_OUTPATIENT_CLINIC_OR_DEPARTMENT_OTHER): Payer: Self-pay | Admitting: Orthopedic Surgery

## 2024-06-09 ENCOUNTER — Other Ambulatory Visit: Payer: Self-pay

## 2024-06-09 ENCOUNTER — Other Ambulatory Visit (HOSPITAL_COMMUNITY): Payer: Self-pay

## 2024-06-09 NOTE — Progress Notes (Signed)
   06/09/24 1023  PAT Phone Screen  Is the patient taking a GLP-1 receptor agonist? No  Do You Have Diabetes? No  Do You Have Hypertension? No  Have You Ever Been to the ER for Asthma? No  Have You Taken Oral Steroids in the Past 3 Months? No  Do you Take Phenteramine or any Other Diet Drugs? No  Recent  Lab Work, EKG, CXR? Yes  Where was this test performed? EKG 01/28/24 [facility]  Do you have a history of heart problems? No  Cardiologist Name OV 03/27/24 w/ Chrisandra Finder NP for palpitations/ PVCs- negative work up, f/u one year  Have you ever had tests on your heart? Yes  What cardiac tests were performed? Echo;Other (comment)  What date/year were cardiac tests completed? Calcium  score 0 04/10/24, Echo EF 60-65% 04/26/24,  Zio normal study 03/10/24  Results viewable: Care Everywhere  Any Recent Hospitalizations? No  Height 5' 5 (1.651 m)  Weight 95.7 kg  Pat Appointment Scheduled No  Reason for No Appointment Not Needed

## 2024-06-10 ENCOUNTER — Other Ambulatory Visit (HOSPITAL_COMMUNITY): Payer: Self-pay

## 2024-06-13 DIAGNOSIS — F4310 Post-traumatic stress disorder, unspecified: Secondary | ICD-10-CM | POA: Diagnosis not present

## 2024-06-13 NOTE — Progress Notes (Signed)

## 2024-06-15 NOTE — Anesthesia Preprocedure Evaluation (Signed)
 Anesthesia Evaluation  Patient identified by MRN, date of birth, ID band Patient awake    Reviewed: Allergy & Precautions, NPO status , Patient's Chart, lab work & pertinent test results  History of Anesthesia Complications (+) PONV and history of anesthetic complications  Airway Mallampati: II  TM Distance: >3 FB Neck ROM: Full    Dental  (+) Dental Advisory Given, Teeth Intact   Pulmonary sleep apnea , former smoker   Pulmonary exam normal        Cardiovascular negative cardio ROS Normal cardiovascular exam     Neuro/Psych  Headaches PSYCHIATRIC DISORDERS Anxiety Depression       GI/Hepatic Neg liver ROS,GERD  Medicated and Controlled,, S/p gastric bypass    Endo/Other   Obesity Pre-DM   Renal/GU negative Renal ROS     Musculoskeletal negative musculoskeletal ROS (+)    Abdominal   Peds  Hematology negative hematology ROS (+)   Anesthesia Other Findings   Reproductive/Obstetrics                              Anesthesia Physical Anesthesia Plan  ASA: 2  Anesthesia Plan: General   Post-op Pain Management: Regional block* and Tylenol  PO (pre-op)*   Induction: Intravenous  PONV Risk Score and Plan: 4 or greater and Treatment may vary due to age or medical condition, Ondansetron , Midazolam , Scopolamine  patch - Pre-op and Dexamethasone   Airway Management Planned: Oral ETT  Additional Equipment: None  Intra-op Plan:   Post-operative Plan: Extubation in OR  Informed Consent: I have reviewed the patients History and Physical, chart, labs and discussed the procedure including the risks, benefits and alternatives for the proposed anesthesia with the patient or authorized representative who has indicated his/her understanding and acceptance.     Dental advisory given  Plan Discussed with: CRNA and Anesthesiologist  Anesthesia Plan Comments:          Anesthesia Quick  Evaluation

## 2024-06-16 ENCOUNTER — Ambulatory Visit (HOSPITAL_BASED_OUTPATIENT_CLINIC_OR_DEPARTMENT_OTHER): Payer: Self-pay | Admitting: Anesthesiology

## 2024-06-16 ENCOUNTER — Ambulatory Visit (HOSPITAL_BASED_OUTPATIENT_CLINIC_OR_DEPARTMENT_OTHER)
Admission: RE | Admit: 2024-06-16 | Discharge: 2024-06-16 | Disposition: A | Discharge status: Home / Self Care | Attending: Orthopedic Surgery | Admitting: Orthopedic Surgery

## 2024-06-16 ENCOUNTER — Encounter (HOSPITAL_BASED_OUTPATIENT_CLINIC_OR_DEPARTMENT_OTHER): Payer: Self-pay | Admitting: Orthopedic Surgery

## 2024-06-16 ENCOUNTER — Other Ambulatory Visit (HOSPITAL_COMMUNITY): Payer: Self-pay

## 2024-06-16 ENCOUNTER — Encounter (HOSPITAL_BASED_OUTPATIENT_CLINIC_OR_DEPARTMENT_OTHER): Admission: RE | Disposition: A | Payer: Self-pay | Source: Home / Self Care | Attending: Orthopedic Surgery

## 2024-06-16 ENCOUNTER — Other Ambulatory Visit: Payer: Self-pay

## 2024-06-16 DIAGNOSIS — M67911 Unspecified disorder of synovium and tendon, right shoulder: Secondary | ICD-10-CM | POA: Diagnosis not present

## 2024-06-16 DIAGNOSIS — X58XXXA Exposure to other specified factors, initial encounter: Secondary | ICD-10-CM | POA: Insufficient documentation

## 2024-06-16 DIAGNOSIS — F32A Depression, unspecified: Secondary | ICD-10-CM | POA: Diagnosis not present

## 2024-06-16 DIAGNOSIS — K219 Gastro-esophageal reflux disease without esophagitis: Secondary | ICD-10-CM | POA: Insufficient documentation

## 2024-06-16 DIAGNOSIS — M75121 Complete rotator cuff tear or rupture of right shoulder, not specified as traumatic: Secondary | ICD-10-CM | POA: Diagnosis not present

## 2024-06-16 DIAGNOSIS — G4730 Sleep apnea, unspecified: Secondary | ICD-10-CM | POA: Insufficient documentation

## 2024-06-16 DIAGNOSIS — S46911A Strain of unspecified muscle, fascia and tendon at shoulder and upper arm level, right arm, initial encounter: Secondary | ICD-10-CM | POA: Diagnosis present

## 2024-06-16 DIAGNOSIS — Z9884 Bariatric surgery status: Secondary | ICD-10-CM | POA: Diagnosis not present

## 2024-06-16 DIAGNOSIS — R7303 Prediabetes: Secondary | ICD-10-CM | POA: Diagnosis not present

## 2024-06-16 DIAGNOSIS — M25811 Other specified joint disorders, right shoulder: Secondary | ICD-10-CM | POA: Insufficient documentation

## 2024-06-16 DIAGNOSIS — Z87891 Personal history of nicotine dependence: Secondary | ICD-10-CM | POA: Insufficient documentation

## 2024-06-16 DIAGNOSIS — Z79899 Other long term (current) drug therapy: Secondary | ICD-10-CM | POA: Insufficient documentation

## 2024-06-16 DIAGNOSIS — G8918 Other acute postprocedural pain: Secondary | ICD-10-CM | POA: Diagnosis not present

## 2024-06-16 DIAGNOSIS — Z7984 Long term (current) use of oral hypoglycemic drugs: Secondary | ICD-10-CM | POA: Diagnosis not present

## 2024-06-16 DIAGNOSIS — F419 Anxiety disorder, unspecified: Secondary | ICD-10-CM | POA: Diagnosis not present

## 2024-06-16 HISTORY — PX: SUBACROMIAL DECOMPRESSION: SHX5174

## 2024-06-16 HISTORY — PX: BICEPT TENODESIS: SHX5116

## 2024-06-16 HISTORY — PX: SHOULDER ARTHROSCOPY WITH ROTATOR CUFF REPAIR: SHX5685

## 2024-06-16 SURGERY — DECOMPRESSION, SUBACROMIAL SPACE
Anesthesia: General | Site: Shoulder | Laterality: Right

## 2024-06-16 MED ORDER — ONDANSETRON HCL 4 MG/2ML IJ SOLN
INTRAMUSCULAR | Status: DC | PRN
  Administered 2024-06-16: 4 mg via INTRAVENOUS

## 2024-06-16 MED ORDER — VANCOMYCIN HCL IN DEXTROSE 1-5 GM/200ML-% IV SOLN
1000.0000 mg | INTRAVENOUS | Status: AC
  Administered 2024-06-16: 1000 mg via INTRAVENOUS

## 2024-06-16 MED ORDER — OXYCODONE HCL 5 MG PO TABS
5.0000 mg | ORAL_TABLET | ORAL | 0 refills | Status: AC | PRN
  Filled 2024-06-16: qty 30, 5d supply, fill #0

## 2024-06-16 MED ORDER — ONDANSETRON 4 MG PO TBDP
4.0000 mg | ORAL_TABLET | Freq: Three times a day (TID) | ORAL | 0 refills | Status: AC | PRN
  Filled 2024-06-16: qty 20, 7d supply, fill #0

## 2024-06-16 MED ORDER — MIDAZOLAM HCL 2 MG/2ML IJ SOLN
INTRAMUSCULAR | Status: AC
  Filled 2024-06-16: qty 2

## 2024-06-16 MED ORDER — OXYCODONE HCL 5 MG/5ML PO SOLN: 5.0000 mg | Freq: Once | ORAL | Status: DC | PRN

## 2024-06-16 MED ORDER — ROCURONIUM BROMIDE 100 MG/10ML IV SOLN
INTRAVENOUS | Status: DC | PRN
  Administered 2024-06-16: 50 mg via INTRAVENOUS

## 2024-06-16 MED ORDER — AMISULPRIDE (ANTIEMETIC) 5 MG/2ML IV SOLN
INTRAVENOUS | Status: AC
  Filled 2024-06-16: qty 4

## 2024-06-16 MED ORDER — ACETAMINOPHEN 500 MG PO TABS
ORAL_TABLET | ORAL | Status: AC
  Filled 2024-06-16: qty 2

## 2024-06-16 MED ORDER — FENTANYL CITRATE (PF) 100 MCG/2ML IJ SOLN: 25.0000 ug | INTRAMUSCULAR | Status: DC | PRN

## 2024-06-16 MED ORDER — FENTANYL CITRATE (PF) 100 MCG/2ML IJ SOLN
50.0000 ug | Freq: Once | INTRAMUSCULAR | Status: AC
  Administered 2024-06-16: 50 ug via INTRAVENOUS

## 2024-06-16 MED ORDER — FENTANYL CITRATE (PF) 100 MCG/2ML IJ SOLN
INTRAMUSCULAR | Status: DC | PRN
  Administered 2024-06-16 (×2): 50 ug via INTRAVENOUS

## 2024-06-16 MED ORDER — MIDAZOLAM HCL 2 MG/2ML IJ SOLN
1.0000 mg | Freq: Once | INTRAMUSCULAR | Status: AC
  Administered 2024-06-16: 1 mg via INTRAVENOUS

## 2024-06-16 MED ORDER — FENTANYL CITRATE (PF) 100 MCG/2ML IJ SOLN
INTRAMUSCULAR | Status: AC
  Filled 2024-06-16: qty 2

## 2024-06-16 MED ORDER — PROPOFOL 10 MG/ML IV BOLUS
INTRAVENOUS | Status: AC
  Filled 2024-06-16: qty 20

## 2024-06-16 MED ORDER — EPINEPHRINE PF 1 MG/ML IJ SOLN
INTRAMUSCULAR | Status: AC
  Filled 2024-06-16: qty 1

## 2024-06-16 MED ORDER — LEVOFLOXACIN IN D5W 500 MG/100ML IV SOLN
500.0000 mg | INTRAVENOUS | Status: AC
  Administered 2024-06-16: 500 mg via INTRAVENOUS

## 2024-06-16 MED ORDER — MIDAZOLAM HCL 5 MG/5ML IJ SOLN
INTRAMUSCULAR | Status: DC | PRN
  Administered 2024-06-16: 2 mg via INTRAVENOUS

## 2024-06-16 MED ORDER — SODIUM CHLORIDE 0.9 % IR SOLN
Status: DC | PRN
Start: 2024-06-16 — End: 2024-06-16
  Administered 2024-06-16: 3000 mL

## 2024-06-16 MED ORDER — SCOPOLAMINE 1 MG/3DAYS TD PT72
1.0000 | MEDICATED_PATCH | TRANSDERMAL | Status: DC
  Administered 2024-06-16: 1 mg via TRANSDERMAL

## 2024-06-16 MED ORDER — OXYCODONE HCL 5 MG PO TABS: 5.0000 mg | ORAL_TABLET | Freq: Once | ORAL | Status: DC | PRN

## 2024-06-16 MED ORDER — LIDOCAINE HCL (CARDIAC) PF 100 MG/5ML IV SOSY
PREFILLED_SYRINGE | INTRAVENOUS | Status: DC | PRN
  Administered 2024-06-16: 40 mg via INTRAVENOUS

## 2024-06-16 MED ORDER — SCOPOLAMINE 1 MG/3DAYS TD PT72
MEDICATED_PATCH | TRANSDERMAL | Status: AC
  Filled 2024-06-16: qty 1

## 2024-06-16 MED ORDER — SODIUM CHLORIDE 0.9 % IV SOLN: 12.5000 mg | INTRAVENOUS | Status: DC | PRN

## 2024-06-16 MED ORDER — DEXAMETHASONE SODIUM PHOSPHATE 10 MG/ML IJ SOLN
INTRAMUSCULAR | Status: AC
  Filled 2024-06-16: qty 1

## 2024-06-16 MED ORDER — ONDANSETRON HCL 4 MG/2ML IJ SOLN
INTRAMUSCULAR | Status: AC
  Filled 2024-06-16: qty 2

## 2024-06-16 MED ORDER — LIDOCAINE 2% (20 MG/ML) 5 ML SYRINGE
INTRAMUSCULAR | Status: AC
  Filled 2024-06-16: qty 5

## 2024-06-16 MED ORDER — DEXAMETHASONE SODIUM PHOSPHATE 4 MG/ML IJ SOLN
INTRAMUSCULAR | Status: DC | PRN
  Administered 2024-06-16: 5 mg via INTRAVENOUS

## 2024-06-16 MED ORDER — AMISULPRIDE (ANTIEMETIC) 5 MG/2ML IV SOLN
10.0000 mg | Freq: Once | INTRAVENOUS | Status: AC | PRN
  Administered 2024-06-16: 10 mg via INTRAVENOUS

## 2024-06-16 MED ORDER — BUPIVACAINE HCL (PF) 0.5 % IJ SOLN
INTRAMUSCULAR | Status: DC | PRN
  Administered 2024-06-16: 15 mL via PERINEURAL

## 2024-06-16 MED ORDER — SUGAMMADEX SODIUM 200 MG/2ML IV SOLN
INTRAVENOUS | Status: DC | PRN
  Administered 2024-06-16: 200 mg via INTRAVENOUS

## 2024-06-16 MED ORDER — BUPIVACAINE HCL (PF) 0.25 % IJ SOLN
INTRAMUSCULAR | Status: AC
  Filled 2024-06-16: qty 30

## 2024-06-16 MED ORDER — PROPOFOL 10 MG/ML IV BOLUS
INTRAVENOUS | Status: DC | PRN
  Administered 2024-06-16: 50 mg via INTRAVENOUS
  Administered 2024-06-16: 150 mg via INTRAVENOUS

## 2024-06-16 MED ORDER — BUPIVACAINE LIPOSOME 1.3 % IJ SUSP
INTRAMUSCULAR | Status: DC | PRN
  Administered 2024-06-16: 10 mL via PERINEURAL

## 2024-06-16 MED ORDER — SODIUM CHLORIDE 0.9 % IR SOLN: Status: DC | PRN

## 2024-06-16 MED ORDER — ACETAMINOPHEN 500 MG PO TABS
1000.0000 mg | ORAL_TABLET | Freq: Once | ORAL | Status: AC
  Administered 2024-06-16: 1000 mg via ORAL

## 2024-06-16 MED ORDER — LACTATED RINGERS IV SOLN: INTRAVENOUS | Status: DC

## 2024-06-16 MED ORDER — LEVOFLOXACIN IN D5W 500 MG/100ML IV SOLN
INTRAVENOUS | Status: AC
  Filled 2024-06-16: qty 100

## 2024-06-16 MED ORDER — ROCURONIUM BROMIDE 10 MG/ML (PF) SYRINGE
PREFILLED_SYRINGE | INTRAVENOUS | Status: AC
  Filled 2024-06-16: qty 10

## 2024-06-16 MED ORDER — VANCOMYCIN HCL IN DEXTROSE 1-5 GM/200ML-% IV SOLN
INTRAVENOUS | Status: AC
  Filled 2024-06-16: qty 200

## 2024-06-16 SURGICAL SUPPLY — 43 items
ANCHOR SWIVELOCK SP KL 4.75 (Anchor) IMPLANT
BURR OVAL 8 FLU 4.0X13 (MISCELLANEOUS) ×1 IMPLANT
CANNULA 5.75X71 LONG (CANNULA) IMPLANT
CANNULA PASSPORT 5 (CANNULA) IMPLANT
CANNULA PASSPORT BUTTON 10-40 (CANNULA) IMPLANT
CANNULA TWIST IN 8.25X7CM (CANNULA) IMPLANT
CLSR STERI-STRIP ANTIMIC 1/2X4 (GAUZE/BANDAGES/DRESSINGS) ×1 IMPLANT
COOLER ICEMAN CLASSIC (MISCELLANEOUS) ×1 IMPLANT
CUTTER BONE 4.0MM X 13CM (MISCELLANEOUS) ×1 IMPLANT
DRAPE INCISE IOBAN 66X45 STRL (DRAPES) ×1 IMPLANT
DRAPE STERI 35X30 U-POUCH (DRAPES) ×1 IMPLANT
DRAPE SURG 17X23 STRL (DRAPES) ×1 IMPLANT
DRAPE U-SHAPE 47X51 STRL (DRAPES) ×1 IMPLANT
DRAPE U-SHAPE 76X120 STRL (DRAPES) ×2 IMPLANT
DURAPREP 26ML APPLICATOR (WOUND CARE) ×1 IMPLANT
GAUZE PAD ABD 8X10 STRL (GAUZE/BANDAGES/DRESSINGS) ×1 IMPLANT
GAUZE SPONGE 4X4 12PLY STRL (GAUZE/BANDAGES/DRESSINGS) ×1 IMPLANT
GLOVE BIO SURGEON STRL SZ7.5 (GLOVE) ×2 IMPLANT
GLOVE BIOGEL PI IND STRL 8 (GLOVE) ×2 IMPLANT
GOWN STRL REUS W/ TWL LRG LVL3 (GOWN DISPOSABLE) ×1 IMPLANT
GOWN STRL REUS W/TWL XL LVL3 (GOWN DISPOSABLE) ×2 IMPLANT
MANIFOLD NEPTUNE II (INSTRUMENTS) ×1 IMPLANT
NDL HD SCORPION MEGA LOADER (NEEDLE) IMPLANT
NDL SAFETY ECLIPSE 18X1.5 (NEEDLE) ×1 IMPLANT
PACK ARTHROSCOPY DSU (CUSTOM PROCEDURE TRAY) ×1 IMPLANT
PACK BASIN DAY SURGERY FS (CUSTOM PROCEDURE TRAY) ×1 IMPLANT
PAD COLD SHLDR WRAP-ON (PAD) ×1 IMPLANT
SLEEVE ARM SUSPENSION SYSTEM (MISCELLANEOUS) ×1 IMPLANT
SLEEVE SCD COMPRESS KNEE MED (STOCKING) ×1 IMPLANT
SLING ARM FOAM STRAP LRG (SOFTGOODS) IMPLANT
SLING S3 LATERAL DISP (MISCELLANEOUS) IMPLANT
SLING SHLDR PAD UNIV <17 (SOFTGOODS) IMPLANT
SUT MNCRL AB 3-0 PS2 18 (SUTURE) ×1 IMPLANT
SUT PDS AB 1 CT 36 (SUTURE) IMPLANT
SUTURE TAPE 1.3 40 TPR END (SUTURE) IMPLANT
SUTURE TAPE TIGERLINK 1.3MM BL (SUTURE) IMPLANT
SYR 5ML LL (SYRINGE) ×1 IMPLANT
SYSTEM TENODESIS BC LNT 3.9 (Orthopedic Implant) IMPLANT
TAPE FIBER 2MM 7IN #2 BLUE (SUTURE) IMPLANT
TOWEL GREEN STERILE FF (TOWEL DISPOSABLE) ×2 IMPLANT
TUBE CONNECTING 20X1/4 (TUBING) ×1 IMPLANT
TUBING ARTHROSCOPY IRRIG 16FT (MISCELLANEOUS) ×1 IMPLANT
WAND ABLATOR APOLLO I90 (BUR) ×1 IMPLANT

## 2024-06-16 NOTE — Transfer of Care (Signed)
 Immediate Anesthesia Transfer of Care Note  Patient: Caroline Gonzales  Procedure(s) Performed: DECOMPRESSION, SUBACROMIAL SPACE (Right) ARTHROSCOPY, SHOULDER, WITH ROTATOR CUFF REPAIR (Right) TENODESIS, BICEPS (Right)  Patient Location: PACU  Anesthesia Type:General  Level of Consciousness: awake  Airway & Oxygen Therapy: Patient Spontanous Breathing  Post-op Assessment: Report given to RN  Post vital signs: Reviewed and stable  Last Vitals:  Vitals Value Taken Time  BP 118/69 06/16/24 08:53  Temp    Pulse 67 06/16/24 08:56  Resp 15 06/16/24 08:56  SpO2 92 % 06/16/24 08:56  Vitals shown include unfiled device data.  Last Pain:  Vitals:   06/16/24 0650  TempSrc: Temporal  PainSc: 2          Complications: No notable events documented.

## 2024-06-16 NOTE — Anesthesia Postprocedure Evaluation (Signed)
 Anesthesia Post Note  Patient: Caroline Gonzales  Procedure(s) Performed: DECOMPRESSION, SUBACROMIAL SPACE (Right: Shoulder) ARTHROSCOPY, SHOULDER, WITH ROTATOR CUFF REPAIR (Right: Shoulder) TENODESIS, BICEPS (Right: Shoulder)     Patient location during evaluation: PACU Anesthesia Type: General Level of consciousness: awake and alert Pain management: pain level controlled Vital Signs Assessment: post-procedure vital signs reviewed and stable Respiratory status: spontaneous breathing, nonlabored ventilation and respiratory function stable Cardiovascular status: blood pressure returned to baseline and stable Postop Assessment: no apparent nausea or vomiting Anesthetic complications: no   No notable events documented.  Last Vitals:  Vitals:   06/16/24 0915 06/16/24 0936  BP: 124/63 96/69  Pulse: 63 65  Resp: 15 16  Temp:  (!) 36.2 C  SpO2: 94% 94%    Last Pain:  Vitals:   06/16/24 0936  TempSrc:   PainSc: 0-No pain                 Debby FORBES Like

## 2024-06-16 NOTE — Anesthesia Procedure Notes (Signed)
 Anesthesia Regional Block: Interscalene brachial plexus block   Pre-Anesthetic Checklist: , timeout performed,  Correct Patient, Correct Site, Correct Laterality,  Correct Procedure, Correct Position, site marked,  Risks and benefits discussed,  Surgical consent,  Pre-op evaluation,  At surgeon's request and post-op pain management  Laterality: Right  Prep: chloraprep       Needles:  Injection technique: Single-shot  Needle Type: Echogenic Needle     Needle Length: 5cm  Needle Gauge: 21     Additional Needles:   Narrative:  Start time: 06/16/2024 7:02 AM End time: 06/16/2024 7:06 AM Injection made incrementally with aspirations every 5 mL.  Performed by: Personally  Anesthesiologist: Lucious Debby BRAVO, MD  Additional Notes: No pain on injection. No increased resistance to injection. Injection made in 5cc increments. Good needle visualization. Patient tolerated the procedure well.

## 2024-06-16 NOTE — Discharge Instructions (Addendum)
 Orthopedic surgery discharge instructions:  -Maintain postoperative bandages for 3 days.  You may remove these bandages on post op day 3 and begin showering at that time.  Please do not submerge underwater.  -Maintain your arm in sling at all times.  You should only remove for showering and getting dressed.  No lifting with the operative arm.  -For mild to moderate pain use Tylenol  and Advil  in alternating fashion around-the-clock.  For breakthrough pain use oxycodone  as necessary.  -Please apply ice to the right shoulder for 20-30 minutes out of each hour that you are awake.  Do this around-the-clock for the first 3 days from surgery.  -Follow-up in 2 weeks for routine postoperative check.    No Tylenol  before 1pm today.   Post Anesthesia Home Care Instructions  Activity: Get plenty of rest for the remainder of the day. A responsible individual must stay with you for 24 hours following the procedure.  For the next 24 hours, DO NOT: -Drive a car -Advertising copywriter -Drink alcoholic beverages -Take any medication unless instructed by your physician -Make any legal decisions or sign important papers.  Meals: Start with liquid foods such as gelatin or soup. Progress to regular foods as tolerated. Avoid greasy, spicy, heavy foods. If nausea and/or vomiting occur, drink only clear liquids until the nausea and/or vomiting subsides. Call your physician if vomiting continues.  Special Instructions/Symptoms: Your throat may feel dry or sore from the anesthesia or the breathing tube placed in your throat during surgery. If this causes discomfort, gargle with warm salt water . The discomfort should disappear within 24 hours.  If you had a scopolamine  patch placed behind your ear for the management of post- operative nausea and/or vomiting:  1. The medication in the patch is effective for 72 hours, after which it should be removed.  Wrap patch in a tissue and discard in the trash. Wash hands  thoroughly with soap and water . 2. You may remove the patch earlier than 72 hours if you experience unpleasant side effects which may include dry mouth, dizziness or visual disturbances. 3. Avoid touching the patch. Wash your hands with soap and water  after contact with the patch.   Regional Anesthesia Blocks  1. You may not be able to move or feel the blocked extremity after a regional anesthetic block. This may last may last from 3-48 hours after placement, but it will go away. The length of time depends on the medication injected and your individual response to the medication. As the nerves start to wake up, you may experience tingling as the movement and feeling returns to your extremity. If the numbness and inability to move your extremity has not gone away after 48 hours, please call your surgeon.   2. The extremity that is blocked will need to be protected until the numbness is gone and the strength has returned. Because you cannot feel it, you will need to take extra care to avoid injury. Because it may be weak, you may have difficulty moving it or using it. You may not know what position it is in without looking at it while the block is in effect.  3. For blocks in the legs and feet, returning to weight bearing and walking needs to be done carefully. You will need to wait until the numbness is entirely gone and the strength has returned. You should be able to move your leg and foot normally before you try and bear weight or walk. You will need someone to  be with you when you first try to ensure you do not fall and possibly risk injury.  4. Bruising and tenderness at the needle site are common side effects and will resolve in a few days.  5. Persistent numbness or new problems with movement should be communicated to the surgeon or the [name] [phone] [facility] [phone]).Information for Discharge Teaching: EXPAREL  (bupivacaine  liposome injectable  suspension)   Pain relief is important to your recovery. The goal is to control your pain so you can move easier and return to your normal activities as soon as possible after your procedure. Your physician may use several types of medicines to manage pain, swelling, and more.  Your surgeon or anesthesiologist gave you EXPAREL (bupivacaine ) to help control your pain after surgery.  EXPAREL  is a local anesthetic designed to release slowly over an extended period of time to provide pain relief by numbing the tissue around the surgical site. EXPAREL  is designed to release pain medication over time and can control pain for up to 72 hours. Depending on how you respond to EXPAREL , you may require less pain medication during your recovery. EXPAREL  can help reduce or eliminate the need for opioids during the first few days after surgery when pain relief is needed the most. EXPAREL  is not an opioid and is not addictive. It does not cause sleepiness or sedation.   Important! A teal colored band has been placed on your arm with the date, time and amount of EXPAREL  you have received. Please leave this armband in place for the full 96 hours following administration, and then you may remove the band. If you return to the hospital for any reason within 96 hours following the administration of EXPAREL , the armband provides important information that your health care providers to know, and alerts them that you have received this anesthetic.    Possible side effects of EXPAREL : Temporary loss of sensation or ability to move in the area where medication was injected. Nausea, vomiting, constipation Rarely, numbness and tingling in your mouth or lips, lightheadedness, or anxiety may occur. Call your doctor right away if you think you may be experiencing any of these sensations, or if you have other questions regarding possible side effects.  Follow all other discharge instructions given to you by your surgeon or  nurse. Eat a healthy diet and drink plenty of water  or other fluids.

## 2024-06-16 NOTE — Anesthesia Procedure Notes (Signed)
 Procedure Name: Intubation Date/Time: 06/16/2024 7:38 AM  Performed by: Pam Macario BROCKS, CRNAPre-anesthesia Checklist: Patient identified, Emergency Drugs available, Suction available, Patient being monitored and Timeout performed Patient Re-evaluated:Patient Re-evaluated prior to induction Oxygen Delivery Method: Circle system utilized Preoxygenation: Pre-oxygenation with 100% oxygen Induction Type: IV induction Ventilation: Mask ventilation without difficulty Laryngoscope Size: Mac and 3 Grade View: Grade II Tube type: Oral Tube size: 7.0 mm Number of attempts: 1 Airway Equipment and Method: Stylet and Oral airway Placement Confirmation: ETT inserted through vocal cords under direct vision, positive ETCO2, breath sounds checked- equal and bilateral and CO2 detector Secured at: 23 cm Tube secured with: Tape Dental Injury: Teeth and Oropharynx as per pre-operative assessment

## 2024-06-16 NOTE — H&P (Signed)
 ORTHOPAEDIC H and P  REQUESTING PHYSICIAN: Sharl Selinda Dover, MD  PCP:  Oris Camie BRAVO, NP  Chief Complaint: Right shoulder pain HPI: Caroline Gonzales is a 48 y.o. female who complains of right shoulder pain and weakness.  Here today for right shoulder surgery..  no new complaints.  Past Medical History:  Diagnosis Date   Acute costochondritis 11/14/2021   Allergy    Zyrtec .   Anxiety    followed by Dr. Melba   Back pain    Complication of anesthesia    PONV   Depression    Elevated blood pressure reading without diagnosis of hypertension 12/29/2021   Fibroid    Gastroparesis 09/14/2012   gastric emptying study in 2014   GERD (gastroesophageal reflux disease)    Headache    Hypertriglyceridemia 10/07/2017   Nightmares 07/21/2018   Pneumonia    2013ish   PONV (postoperative nausea and vomiting)     likes scopolamine  patch and zofran  /phenergan  helps   Pre-diabetes    PTSD (post-traumatic stress disorder)    Residual foreign body in soft tissue 11/12/2016   Screening for colon cancer 07/02/2022   Sleep apnea 07/01/2018   cpap optional, pt close to not use cpap   Vitamin D  deficiency    Wears glasses    Past Surgical History:  Procedure Laterality Date   ABDOMINAL HYSTERECTOMY  01/2021   ANKLE ARTHROSCOPY Left 2011   CESAREAN SECTION  06/2006   x 1   CHOLECYSTECTOMY  07/2006   laparoscopic   CYSTOSCOPY N/A 02/04/2021   Procedure: CYSTOSCOPY;  Surgeon: Jannis Kate Norris, MD;  Location: [facility] OR;  Service: Gynecology;  Laterality: N/A;   DILATION AND CURETTAGE OF UTERUS  1997   x 2   FOREIGN BODY REMOVAL Left 11/18/2016   Procedure: REMOVAL FOREIGN BODY EXTREMITY LEFT FOOT;  Surgeon: Donnice JONELLE Fees, DPM;  Location: [facility] OR;  Service: Podiatry;  Laterality: Left;   GASTRIC ROUX-EN-Y N/A 04/13/2023   Procedure: LAPAROSCOPIC ROUX-EN-Y GASTRIC BYPASS WITH UPPER ENDOSCOPY;  Surgeon: Signe Mitzie LABOR, MD;  Location: [facility] ORS;  Service: General;  Laterality: N/A;    KNEE ARTHROSCOPY WITH MEDIAL MENISECTOMY Right 09/10/2021   Procedure: KNEE ARTHROSCOPY WITH MEDIAL MENISCAL ROOT REPAIR;  Surgeon: Cristy Bonner DASEN, MD;  Location: [facility];  Service: Orthopedics;  Laterality: Right;   PILONIDAL CYST EXCISION  1990's   RADIOLOGY WITH ANESTHESIA N/A 11/10/2018   Procedure: MRI WITH ANESTHESIA OF BRAIN AND ORBITS WITH AND WITHOUT CONTRAST;  Surgeon: Radiologist, Medication, MD;  Location: [facility] OR;  Service: Radiology;  Laterality: N/A;   TENDON REPAIR Left 2011   Left Ankle   TOTAL LAPAROSCOPIC HYSTERECTOMY WITH SALPINGECTOMY Bilateral 02/04/2021   Procedure: TOTAL LAPAROSCOPIC HYSTERECTOMY WITH SALPINGECTOMY;  Surgeon: Jannis Kate Norris, MD;  Location: [facility] OR;  Service: Gynecology;  Laterality: Bilateral;   UPPER GASTROINTESTINAL ENDOSCOPY     WISDOM TOOTH EXTRACTION  1990's   Social History   Socioeconomic History   Marital status: Married    Spouse name: Not on file   Number of children: 2   Years of education: Not on file   Highest education level: Bachelor's degree (e.g., BA, AB, BS)  Occupational History   Occupation: Teacher, adult education:   Tobacco Use   Smoking status: Former    Current packs/day: 0.00    Average packs/day: 2.0 packs/day for 10.0 years (20.0 ttl pk-yrs)    Types: Cigarettes    Start date: 09/14/1990  Quit date: 09/14/2000    Years since quitting: 23.7   Smokeless tobacco: Never  Vaping Use   Vaping status: Never Used  Substance and Sexual Activity   Alcohol  use: Not Currently   Drug use: No   Sexual activity: Yes    Partners: Male    Birth control/protection: Surgical    Comment: Post-hysterectomy  Other Topics Concern   Not on file  Social History Narrative   Marital status:  Married in 02/2015      Children: 1 son (9); 1 stepdaughter (8)      Lives: with husband, son, stepdaughter joint      Employment: RN at [employer]; 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      Seatbelt: 100%; no texting while driving often       Social Drivers of Corporate investment banker Strain: Low Risk  (02/28/2024)   Overall Financial Resource Strain (CARDIA)    Difficulty of Paying Living Expenses: Not hard at all  Food Insecurity: No Food Insecurity (02/28/2024)   Hunger Vital Sign    Worried About Running Out of Food in the Last Year: Never true    Ran Out of Food in the Last Year: Never true  Transportation Needs: No Transportation Needs (02/28/2024)   PRAPARE - Administrator, Civil Service (Medical): No    Lack of Transportation (Non-Medical): No  Physical Activity: Inactive (02/28/2024)   Exercise Vital Sign    Days of Exercise per Week: 0 days    Minutes of Exercise per Session: Not on file  Stress: No Stress Concern Present (02/28/2024)   [employer] of Occupational Health - Occupational Stress Questionnaire    Feeling of Stress: Not at all  Social Connections: Moderately Isolated (02/28/2024)   Social Connection and Isolation Panel    Frequency of Communication with Friends and Family: Twice a week    Frequency of Social Gatherings with Friends and Family: Once a week    Attends Religious Services: Never    Database administrator or Organizations: No    Attends Engineer, structural: Not on file    Marital Status: Married   Family History  Problem Relation Age of Onset   Squamous cell carcinoma Mother    Cancer Mother        squamous cell carcinoma   Hyperlipidemia Mother    Depression Mother    Anxiety disorder Mother    Obesity Mother    Heart disease Father 55       cardiomegaly, CHF; steroid use.   Hyperlipidemia Father    Hypertension Father    Mental retardation Father    High blood pressure Father    Depression Father    Anxiety disorder Father    Obesity Father    Rheum arthritis Sister    Multiple sclerosis Sister     Autoimmune disease Sister    Drug abuse Sister    Breast cancer Maternal Aunt    Thyroid  cancer Paternal Aunt    Esophageal cancer Paternal Uncle    Squamous cell carcinoma Maternal Grandmother    Cancer Maternal Grandmother    Diabetes Maternal Grandmother    Heart disease Maternal Grandmother    Hyperlipidemia Maternal Grandmother    Hypertension Maternal Grandmother    Stroke  Maternal Grandmother    Heart disease Paternal Grandmother    Hypertension Paternal Grandmother    Heart disease Paternal Grandfather    Hyperlipidemia Paternal Grandfather    Mental illness Paternal Grandfather    Colon cancer Neg Hx    Colon polyps Neg Hx    Rectal cancer Neg Hx    Stomach cancer Neg Hx    Allergies  Allergen Reactions   Bacitracin Rash   Nsaids    Penicillins Shortness Of Breath and Rash   Tamiflu [Oseltamivir Phosphate] Anaphylaxis   Cephalosporins Rash   Latex Rash   Prior to Admission medications   Medication Sig Start Date End Date Taking? Authorizing Provider  Biotin 5 MG TABS Take by mouth.   Yes [provider]  cetirizine  (ZYRTEC ) 10 MG tablet Take 10 mg by mouth at bedtime.   Yes [provider]  Cholecalciferol (VITAMIN D3) 5000 units CAPS Take 5,000 Units by mouth at bedtime.   Yes [provider]  clonazePAM  (KLONOPIN ) 0.5 MG tablet Take 1 tablet (0.5 mg) by mouth every 6 hours as needed for anxiety.  (up to 4 tablets daily) 04/21/24  Yes Rhys Boyer T, PA-C  cyclobenzaprine  (FLEXERIL ) 5 MG tablet Take 1 tablet (5 mg total) by mouth every 8 (eight) hours as needed, for muscle spasms 05/19/24  Yes   estradiol  (VIVELLE -DOT) 0.025 MG/24HR Place 1 patch onto the skin 2 (two) times a week. 11/04/23  Yes Glennon Almarie POUR, MD  fluvoxaMINE  (LUVOX ) 100 MG tablet Take 1 tablet by mouth at bedtime. Take with 50 mg dose to equal 150 mg at bedtime 09/24/23  Yes Hurst, Teresa T, PA-C  fluvoxaMINE  (LUVOX ) 50 MG tablet TAKE 1 TABLET BY MOUTH AT BEDTIME.  TAKE IN ADDITION TO THE 100 MG TABLET TO EQUAL 150 MG TOTAL 09/24/23  Yes Hurst, Teresa T, PA-C  gabapentin  (NEURONTIN ) 300 MG capsule Take 3 capsules (900 mg total) by mouth at bedtime. 03/27/24  Yes Rhys Boyer T, PA-C  Multiple Vitamin (MULTIVITAMIN) capsule Take 1 capsule by mouth daily. bariatric   Yes [provider]  ondansetron  (ZOFRAN -ODT) 8 MG disintegrating tablet Dissolve 1 tablet by mouth every 8 hours as needed for nausea. 11/02/23  Yes Early, Sara E, NP  pantoprazole  (PROTONIX ) 40 MG tablet Take 1 tablet (40 mg total) by mouth daily. 06/07/24  Yes   prazosin  (MINIPRESS ) 5 MG capsule Take 2 capsules (10 mg total) by mouth at bedtime. 09/16/23  Yes Rhys Boyer T, PA-C  propranolol  (INDERAL ) 40 MG tablet Take 1 tablet (40 mg total) by mouth 3 (three) times daily. 03/27/24  Yes Vannie Reche RAMAN, NP  rosuvastatin  (CRESTOR ) 5 MG tablet Take 1 tablet (5 mg total) by mouth daily. 04/07/24 07/06/24 Yes Vannie Reche RAMAN, NP  triamcinolone  (NASACORT ) 55 MCG/ACT AERO nasal inhaler Place 1 spray into the nose at bedtime.   Yes [provider]  Continuous Glucose Sensor (FREESTYLE LIBRE 3 PLUS SENSOR) MISC Change sensor every 15 days. Patient not taking: Reported on 06/09/2024 03/02/24   Early, Sara E, NP  HYDROcodone -acetaminophen  (NORCO/VICODIN) 5-325 MG tablet Take 1 tablet by mouth every 4 (four) to 6 (six) hours as needed. 05/19/24     HYDROcodone -acetaminophen  (NORCO/VICODIN) 5-325 MG tablet Take 1 tablet by mouth every 4-6 hours as needed 06/08/24     metFORMIN  (GLUCOPHAGE ) 500 MG tablet Take 1 tablet (500 mg total) by mouth daily with a meal. Patient not taking: Reported on 06/09/2024 03/02/24   Early, Camie BRAVO, NP  Prasterone  (INTRAROSA ) 6.5 MG INST Place 1 suppository vaginally at bedtime as needed. Patient not taking: Reported on 06/09/2024 11/02/23   Glennon Almarie POUR, MD  QUEtiapine  (SEROQUEL ) 200 MG tablet Take 2 tablets (400 mg total) by mouth at bedtime. 03/21/24   Rhys Boyer T, PA-C   No results found.  Positive ROS: All other systems have been reviewed and were otherwise negative with the exception of those mentioned in the HPI and as above.  Physical Exam: General: Alert, no acute distress Cardiovascular: No pedal edema Respiratory: No cyanosis, no use of accessory musculature GI: No organomegaly, abdomen is soft and non-tender Skin: No lesions in the area of chief complaint Neurologic: Sensation intact distally Psychiatric: Patient is competent for consent with normal mood and affect Lymphatic: No axillary or cervical lymphadenopathy  MUSCULOSKELETAL: RUE-  WWP, NVI  Assessment:  Right shoulder rottator cuff tear Right shoulder impingement  Right shoulder biceps tendinitis  Plan: - plan to proceed today with right shoujlder scope with cuff repair and biceps tenodesis, and decomprssion.  - dc home from pacu  - The risks, benefits, and alternatives were discussed with the patient. There are risks associated with the surgery including, but not limited to, problems with anesthesia (death), infection, , fracture of bones, loosening or failure of implants, malunion, nonunion, hematoma (blood accumulation) which may require surgical drainage, blood clots, pulmonary embolism, nerve injury (foot drop), and blood vessel injury. The patient understands these risks and elects to proceed.     Selinda Belvie Gosling, MD Cell [phone]    06/16/2024 6:28 AM

## 2024-06-16 NOTE — Op Note (Signed)
 Date of Surgery: 06/16/2024  INDICATIONS: Caroline Gonzales is a 48 y.o.-year-old female with a right shoulder full-thickness rotator cuff tear as well as biceps long head tearing and impingement;  The patient did consent to the procedure after discussion of the risks and benefits.  PREOPERATIVE DIAGNOSIS:  1.  Right shoulder full-thickness rotator cuff tear 2.  Right shoulder subacromial impingement 3.  Right shoulder proximal biceps tendinopathy  POSTOPERATIVE DIAGNOSIS:  1.  Right shoulder full-thickness rotator cuff tear 2.  Right shoulder subacromial impingement 3.  Right shoulder proximal biceps tendinopathy 4.  Right shoulder degenerative labral tearing superior, anterior and posterior.  PROCEDURE:  1.  Right shoulder arthroscopic biceps tenodesis 2.  Right shoulder arthroscopic extensive debridement of anterior labrum, superior labrum, posterior labrum as well as rotator interval and subacromial bursectomy 3.  Right shoulder arthroscopic rotator cuff repair 4.  Right shoulder arthroscopic subacromial decompression with coracoacromial ligament release  SURGEON: Selinda SHAUNNA Gosling, M.D.  ASSIST: Dayle Moores, PA-C  Assistant attestation:  PA Mcclung scrubbed and present for the entire procedure..  ANESTHESIA:  general, interscalene block with Exparel   IV FLUIDS AND URINE: See anesthesia.  ESTIMATED BLOOD LOSS: 10 mL.  IMPLANTS: 3.9 mm Arthrex 12 lock anchor x 1 for biceps tenodesis 2 Arthrex suture tapes as well as a 4.75 mm Arthrex swivel lock anchor for superior rotator cuff repair  DRAINS: None  COMPLICATIONS: None.  DESCRIPTION OF PROCEDURE: The patient was brought to the operating room and placed supine on the operating table.  The patient had been signed prior to the procedure and this was documented. The patient had the anesthesia placed by the anesthesiologist.  A time-out was performed to confirm that this was the correct patient, site, side and location. The patient  did receive antibiotics prior to the incision and was re-dosed during the procedure as needed at indicated intervals.  A tourniquet was not placed.  The patient had the operative extremity prepped and draped in the standard surgical fashion.      After obtaining informed consent the patient was brought to the operating table and underwent satisfactory anesthesia. An exam under anesthesia revealed full range of motion. She was placed in the left lateral decubitus position with an axillary roll and all bony prominences properly padded. A standard surgical timeout was performed. He was placed in 10 pounds of gentle in-line suspension.  Standard posterior and anterior superior portals were established. A diagnostic evaluation of the glenohumeral joint was performed.  We identified significant proximal biceps tearing just at the biceps sling adjacent to the upper border of the subscapularis.  However attached to the superior labrum.  There was degenerative labral tearing anterior, superior and posterior.  No loose bodies.  No glenohumeral arthrosis.  Subscapularis, teres minor, infraspinatus were all intact.  The supraspinatus had a crescent shaped tear with thick cable retracted to about 2 cm off of the tuberosity.  There was moderate synovitis noted.  The biceps tendon was markedly synovitic and partially torn.  Next, we established a mid glenoid working portal with spinal needle localization.  We then performed extensive debridement with motorized shaver working through the mid glenoid portal and viewing from the posterior lateral portal.  We debrided the unstable and torn labral tissue anterior, superior and posterior.  We also widely excised the rotator interval.  Next, we performed our arthroscopic biceps tenodesis.  We utilized an Research scientist (life sciences).  A cannula was introduced in the mid glenoid portal and we placed a  luggage tag suture around the proximal biceps just adjacent to the torn  tissue off the labrum.  We then used an antegrade suture passer to pierce the tendon in the mid substance to create another grasping stitch in the long head of the biceps tendon.  The free end of this suture was loaded into a 3.9 mm swivel lock anchor.  A pilot hole was placed just adjacent to the subscapularis upper border.  We then introduced the anchor into that pilot hole.  This created a nice biceps tenodesis high in the biceps groove.  The biceps tendon was released after fixation to assure good tension.  This was accomplished with RF wand.  The arthroscope was inserted in the subacromial space and an additional lateral portal was established. An acromioplasty performed nicely decompressing the subacromial space with a motorized burr.  We also released the coracoacromial ligament to assure that we had good access to the anterior lateral type II acromion.  Following the decompression and subacromial space we had a type I acromion.     Bursitis in the subacromial space was removed as well as releases were performed on the bursal surface of the rotator cuff.  We then moved forward with our rotator cuff repair.  We identified a crescent shaped tear that measured about 1.5 cm x 1.5 cm.  We performed mobilizations on the bursal side and articular side.  We then past 2 separate horizontal mattress sutures with the Arthrex suture tapes utilizing a scorpion antegrade suture black passer.  We then loaded all 4 tails into a single 4.75 mm swivel lock anchor.  The greater tuberosity was then prepared biologically with motorized shaver.  This created petechial bleeding.  We then introduced the anchor just adjacent to the greater tuberosity.  This created nice compression of the rotator cuff to that bleeding bone.  The arthroscope was then removed and portals closed with 4-0 Monocryl in standard fashion followed by a sterile occlusive dressing Polar Care ice sleeve and a slingshot sling. The patient was sent to  recovery in stable condition and tolerated the procedure well  POSTOPERATIVE PLAN:  [name] will be discharged home today from PACU.  She will be in her sling for the first few weeks with the abduction pillow.  She will be on the less than 3 cm rotator cuff repair protocol.

## 2024-06-16 NOTE — Brief Op Note (Signed)
 06/16/2024  8:56 AM  PATIENT:  Caroline Gonzales  48 y.o. female  PRE-OPERATIVE DIAGNOSIS:  Right shoulder rotator cuff tear, biceps tear, impingement  POST-OPERATIVE DIAGNOSIS:  Right shoulder rotator cuff tear, biceps tear, impingement  PROCEDURE:  Procedure(s) with comments: DECOMPRESSION, SUBACROMIAL SPACE (Right) - Right shoulder arthroscopy with rotator cuff repair, biceps tenodesis, subacromial decompression,  ARTHROSCOPY, SHOULDER, WITH ROTATOR CUFF REPAIR (Right) TENODESIS, BICEPS (Right)  SURGEON:  Surgeons and Role:    * Sharl Selinda Dover, MD - Primary  PHYSICIAN ASSISTANT: Dayle Moores, PA-C  ANESTHESIA:   regional and general  EBL:  5 mL   BLOOD ADMINISTERED:none  DRAINS: none   LOCAL MEDICATIONS USED:  NONE  SPECIMEN:  No Specimen  DISPOSITION OF SPECIMEN:  N/A  COUNTS:  YES  TOURNIQUET:  * No tourniquets in log *  DICTATION: .Note written in EPIC  PLAN OF CARE: Discharge to home after PACU  PATIENT DISPOSITION:  PACU - hemodynamically stable.   Delay start of Pharmacological VTE agent (>24hrs) due to surgical blood loss or risk of bleeding: not applicable

## 2024-06-16 NOTE — Progress Notes (Signed)
Assisted Dr. Brock with right, interscalene , ultrasound guided block. Side rails up, monitors on throughout procedure. See vital signs in flow sheet. Tolerated Procedure well. 

## 2024-06-18 ENCOUNTER — Other Ambulatory Visit: Payer: Self-pay | Admitting: Physician Assistant

## 2024-06-19 ENCOUNTER — Encounter (HOSPITAL_BASED_OUTPATIENT_CLINIC_OR_DEPARTMENT_OTHER): Payer: Self-pay | Admitting: Orthopedic Surgery

## 2024-06-19 MED ORDER — QUETIAPINE FUMARATE 200 MG PO TABS
400.0000 mg | ORAL_TABLET | Freq: Every day | ORAL | 0 refills | Status: DC
  Filled 2024-06-19: qty 180, 90d supply, fill #0

## 2024-06-20 ENCOUNTER — Other Ambulatory Visit (HOSPITAL_COMMUNITY): Payer: Self-pay

## 2024-06-20 DIAGNOSIS — F4310 Post-traumatic stress disorder, unspecified: Secondary | ICD-10-CM | POA: Diagnosis not present

## 2024-06-23 ENCOUNTER — Other Ambulatory Visit: Payer: Self-pay

## 2024-06-23 ENCOUNTER — Ambulatory Visit: Attending: Orthopedic Surgery | Admitting: Physical Therapy

## 2024-06-23 ENCOUNTER — Encounter: Payer: Self-pay | Admitting: Physical Therapy

## 2024-06-23 ENCOUNTER — Other Ambulatory Visit (HOSPITAL_COMMUNITY): Payer: Self-pay

## 2024-06-23 DIAGNOSIS — R600 Localized edema: Secondary | ICD-10-CM | POA: Insufficient documentation

## 2024-06-23 DIAGNOSIS — M25511 Pain in right shoulder: Secondary | ICD-10-CM | POA: Diagnosis not present

## 2024-06-23 DIAGNOSIS — M25611 Stiffness of right shoulder, not elsewhere classified: Secondary | ICD-10-CM | POA: Diagnosis not present

## 2024-06-23 DIAGNOSIS — M6281 Muscle weakness (generalized): Secondary | ICD-10-CM | POA: Diagnosis not present

## 2024-06-23 NOTE — Therapy (Signed)
 OUTPATIENT PHYSICAL THERAPY SHOULDER EVALUATION   Patient Name: Caroline Gonzales MRN: [id number] DOB:09/04/1976, 48 y.o., female Today's Date: 06/23/2024  END OF SESSION:  PT End of Session - 06/23/24 1100     Visit Number 1    Date for Recertification  09/15/24    Authorization Type [employer]    PT Start Time 1101    PT Stop Time 1145    PT Time Calculation (min) 44 min          Past Medical History:  Diagnosis Date   Acute costochondritis 11/14/2021   Allergy    Zyrtec .   Anxiety    followed by Dr. Melba   Back pain    Complication of anesthesia    PONV   Depression    Elevated blood pressure reading without diagnosis of hypertension 12/29/2021   Fibroid    Gastroparesis 09/14/2012   gastric emptying study in 2014   GERD (gastroesophageal reflux disease)    Headache    Hypertriglyceridemia 10/07/2017   Nightmares 07/21/2018   Pneumonia    2013ish   PONV (postoperative nausea and vomiting)     likes scopolamine  patch and zofran  /phenergan  helps   Pre-diabetes    PTSD (post-traumatic stress disorder)    Residual foreign body in soft tissue 11/12/2016   Screening for colon cancer 07/02/2022   Sleep apnea 07/01/2018   cpap optional, pt close to not use cpap   Vitamin D  deficiency    Wears glasses    Past Surgical History:  Procedure Laterality Date   ABDOMINAL HYSTERECTOMY  01/2021   ANKLE ARTHROSCOPY Left 2011   BICEPT TENODESIS Right 06/16/2024   Procedure: TENODESIS, BICEPS;  Surgeon: Sharl Selinda Dover, MD;  Location: [facility];  Service: Orthopedics;  Laterality: Right;   CESAREAN SECTION  06/2006   x 1   CHOLECYSTECTOMY  07/2006   laparoscopic   CYSTOSCOPY N/A 02/04/2021   Procedure: CYSTOSCOPY;  Surgeon: Jannis Kate Norris, MD;  Location: [facility] OR;  Service: Gynecology;  Laterality: N/A;   DILATION AND CURETTAGE OF UTERUS  1997   x 2   FOREIGN BODY REMOVAL Left 11/18/2016   Procedure: REMOVAL FOREIGN BODY EXTREMITY LEFT FOOT;   Surgeon: Donnice JONELLE Fees, DPM;  Location: [facility] OR;  Service: Podiatry;  Laterality: Left;   GASTRIC ROUX-EN-Y N/A 04/13/2023   Procedure: LAPAROSCOPIC ROUX-EN-Y GASTRIC BYPASS WITH UPPER ENDOSCOPY;  Surgeon: Signe Mitzie LABOR, MD;  Location: [facility];  Service: General;  Laterality: N/A;   KNEE ARTHROSCOPY WITH MEDIAL MENISECTOMY Right 09/10/2021   Procedure: KNEE ARTHROSCOPY WITH MEDIAL MENISCAL ROOT REPAIR;  Surgeon: Cristy Bonner DASEN, MD;  Location: [facility];  Service: Orthopedics;  Laterality: Right;   PILONIDAL CYST EXCISION  1990's   RADIOLOGY WITH ANESTHESIA N/A 11/10/2018   Procedure: MRI WITH ANESTHESIA OF BRAIN AND ORBITS WITH AND WITHOUT CONTRAST;  Surgeon: Radiologist, Medication, MD;  Location: [facility] OR;  Service: Radiology;  Laterality: N/A;   SHOULDER ARTHROSCOPY WITH ROTATOR CUFF REPAIR Right 06/16/2024   Procedure: ARTHROSCOPY, SHOULDER, WITH ROTATOR CUFF REPAIR;  Surgeon: Sharl Selinda Dover, MD;  Location: [facility];  Service: Orthopedics;  Laterality: Right;   SUBACROMIAL DECOMPRESSION Right 06/16/2024   Procedure: DECOMPRESSION, SUBACROMIAL SPACE;  Surgeon: Sharl Selinda Dover, MD;  Location:  SURGERY CENTER;  Service: Orthopedics;  Laterality: Right;  Right shoulder arthroscopy with rotator cuff repair, biceps tenodesis, subacromial decompression, distal clavicle excision   TENDON REPAIR Left 2011   Left Ankle  TOTAL LAPAROSCOPIC HYSTERECTOMY WITH SALPINGECTOMY Bilateral 02/04/2021   Procedure: TOTAL LAPAROSCOPIC HYSTERECTOMY WITH SALPINGECTOMY;  Surgeon: Jertson, Jill Evelyn, MD;  Location: [facility] OR;  Service: Gynecology;  Laterality: Bilateral;   UPPER GASTROINTESTINAL ENDOSCOPY     WISDOM TOOTH EXTRACTION  1990's   Patient Active Problem List   Diagnosis Date Noted   History of nocturnal hypoglycemia 03/02/2024   Encounter for annual physical exam 03/02/2024   Family history of elevated lipoprotein (a) 04/26/2023   PVC's (premature  ventricular contractions) 04/26/2023   Morbid obesity (HCC) 04/13/2023   Gastroesophageal reflux disease 12/24/2022   Abnormal laboratory test 12/24/2022   Chest wall pain 12/24/2022   Change in facial mole 12/29/2021   S/P laparoscopic hysterectomy 02/04/2021   PTSD (post-traumatic stress disorder) 07/21/2018   GAD (generalized anxiety disorder) 07/21/2018   Insomnia 07/21/2018   Recurrent major depressive disorder, in partial remission 07/21/2018   Insulin  resistance 10/21/2017   Hypertriglyceridemia 10/07/2017   Vitamin D  deficiency 10/07/2017   Pre-diabetes 11/02/2016   Morbid obesity with body mass index (BMI) of 50.0 to 59.9 in adult (HCC) 10/15/2016    PCP: Oris Credit NP  REFERRING PROVIDER: Sharl Mayo MD  REFERRING DIAG: s/p right shoulder scope with rotator cuff repair, biceps tenodesis and subacromial decompression  THERAPY DIAG: right shoulder pain; right shoulder stiffness; weakness   Rationale for Evaluation and Treatment: Rehabilitation  ONSET DATE: Spring 2025  SUBJECTIVE:                                                                                                                                                                                      SUBJECTIVE STATEMENT: Months history of right shoulder pain, started in Spring.  Keeping awake at night by June.  No trauma.  MRI showed; Full thickness tear of subscapularis and supraspinatus and biceps tear. Surgery 10/3; In sling with abduction pillow wears full time except for showering.  Slept in recliner.  Did wake up last night with arm out of the sling with her arm straight.   Hand dominance: Right  Date of surgery 06/16/2024 s/p right shoulder scope with rotator cuff repair, biceps tenodesis and subacromial decompression  PERTINENT HISTORY: Knee scope 12/29/23 Can't take NSAIDS b/c of gastric bypass  PAIN:   Are you having pain? Yes NPRS scale: 2/10 Pain location: anterior right shoulder, neck  shoulder Pain orientation: Right  PAIN TYPE: aching Pain description: constant  Aggravating factors: post surgical pain mostly well managed Relieving factors: medication   PRECAUTIONS: Shoulder surgical   WEIGHT BEARING RESTRICTIONS: No  FALLS:  Has patient fallen in last 6 months? 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  PLOF: Independent  PATIENT GOALS:baseline be 75% ; shopping, goes to [employer] stretching class in the water   NEXT MD VISIT:   OBJECTIVE:  Note: Objective measures were completed at Evaluation unless otherwise noted.  DIAGNOSTIC FINDINGS:  Full thickness tear of subscapularis and supraspinatus and biceps tear  PATIENT SURVEYS:  Quick Dash:  QUICK DASH  Please rate your ability do the following activities in the last week by selecting the number below the appropriate response.   Activities Rating  Open a tight or new jar.  5 = Unable  Do heavy household chores (e.g., wash walls, floors). 5 = Unable  Carry a shopping bag or briefcase 5 = Unable  Wash your back. 5 = Unable  Use a knife to cut food. 5 = Unable  Recreational activities in which you take some force or impact through your arm, shoulder or hand (e.g., golf, hammering, tennis, etc.). 5 = Unable  During the past week, to what extent has your arm, shoulder or hand problem interfered with your normal social activities with family, friends, neighbors or groups?  4 = Quite a bit  During the past week, were you limited in your work or other regular daily activities as a result of your arm, shoulder or hand problem? 5 = Unable  Rate the severity of the following symptoms in the last week: Arm, Shoulder, or hand pain. 4 = Severe  Rate the severity of the following symptoms in the last week: Tingling (pins and needles) in your arm, shoulder or hand. 1 = none   During the past week, how much difficulty have you had sleeping because of the pain in your arm, shoulder or hand?  3 = Moderate difficulty   (A QuickDASH score may not be calculated if there is greater than 1 missing item.)  Quick Dash Disability/Symptom Score: 81.8%   Minimally Clinically Important Difference (MCID): 15-20 points  Flavio, F. et al. (2013). Minimally clinically important difference of the disabilities of the arm, shoulder, and hand outcome measures (DASH) and its shortened version (Quick DASH). Journal of Orthopaedic & Sports Physical Therapy, 44(1), 30-39)   COGNITION: Overall cognitive status: Within functional limits for tasks assessed     SENSATION: Denies numbness/tingling  APPEARANCE:  3 portal incisions with steri strips; no redness; no drainage; amount of edema consistent with 1 week post op    UPPER EXTREMITY ROM:  Passive ROM Right eval   Shoulder flexion 15   Shoulder extension    Shoulder abduction 10   Shoulder adduction    Shoulder extension    Shoulder internal rotation 45   Shoulder external rotation 0   Elbow flexion    Elbow extension    Wrist flexion    Wrist extension    Wrist ulnar deviation    Wrist radial deviation    Wrist pronation    Wrist supination     (Blank rows = not tested)   Active ROM Right eval Left eval  Shoulder flexion Not tested secondary to surgical precautions WFLS  Shoulder extension    Shoulder abduction  WFLS  Shoulder adduction    Shoulder internal rotation  WFLs  Shoulder external rotation  WFLs  Elbow flexion    Elbow extension    Wrist flexion    Wrist extension    Wrist ulnar deviation    Wrist radial deviation  Wrist pronation    Wrist supination    (Blank rows = not tested)  UPPER EXTREMITY MMT:  MMT Right eval Left eval  Shoulder flexion Not tested secondary to surgical precautions 5  Shoulder extension  5  Shoulder abduction  5  Shoulder adduction    Shoulder internal  rotation  5  Shoulder external rotation  5  Middle trapezius    Lower trapezius    Elbow flexion  5  Elbow extension  5  Wrist flexion  5  Wrist extension  5  Wrist ulnar deviation    Wrist radial deviation    Wrist pronation    Wrist supination    Grip strength (lbs)    (Blank rows = not tested)                                                                                                                              TREATMENT DATE: 06/23/24 Evaluation Gentle passive ROM flexion, abduction oscillations 15 degrees with care to avoid hyperextension and maintaining elbow 90 degrees Sling adjustment and discussed neutral positioning, avoidance of hyperextension, having abduction pillow in the right spot Discussed cervical ROM and gripping exs; OK to move fingers and wrist Discussed surgical precautions: avoid full elbow extension secondary to tenodesis, no active shoulder ROM/ passive only Scapular retractions    PATIENT EDUCATION: Education details: Educated patient on anatomy and physiology of current symptoms, prognosis, plan of care as well as initial self care strategies to promote recovery  Person educated: Patient Education method: Explanation Education comprehension: verbalized understanding  HOME EXERCISE PROGRAM: To be started   ASSESSMENT:  CLINICAL IMPRESSION: Patient is a 48 y.o. female who was seen today for physical therapy evaluation and treatment 1 week s/p right shoulder scope with rotator cuff repair, biceps tenodesis and subacromial decompression.  The patient would benefit from skilled PT to address shoulder range of motion limitations while following surgical precautions. When appropriate will progress from passive ROM to active assisted to active ROM and progressive strengthening.  Her current impairments and pain affect ability to perform basic ADLs including personal hygiene, dressing and sleeping and she is unable to work at this time.   OBJECTIVE  IMPAIRMENTS: decreased activity tolerance, decreased ROM, decreased strength, increased edema, impaired perceived functional ability, impaired UE functional use, and pain.   ACTIVITY LIMITATIONS: carrying, lifting, sleeping, bed mobility, bathing, toileting, dressing, reach over head, and hygiene/grooming  PARTICIPATION LIMITATIONS: meal prep, cleaning, laundry, driving, shopping, community activity, and occupation  PERSONAL FACTORS: Time since onset of injury/illness/exacerbation are also affecting patient's functional outcome.   REHAB POTENTIAL: Good  CLINICAL DECISION MAKING: Stable/uncomplicated  EVALUATION COMPLEXITY: Low   GOALS: Goals reviewed with patient? Yes  SHORT TERM GOALS: Target date: 08/04/2024     The patient will demonstrate knowledge of basic self care strategies and exercises to promote healing  Baseline: Goal status: INITIAL  2.  The patient will report a 40% improvement in pain  levels with functional activities which are currently difficult including sleeping, dressing, bathing Baseline:  Goal status: INITIAL  3.  Passive elevation to 150 degrees needed for overhead reaching Baseline:  Goal status: INITIAL  4.  Quick DASH functional outcome score improved to 70% indicating improved function with less pain Baseline:  Goal status: INITIAL     LONG TERM GOALS: Target date: 09/15/2024    The patient will be independent in a safe self progression of a home exercise program to promote further recovery of function  Baseline:  Goal status: INITIAL  2.  The patient will report a 75% improvement in pain levels with functional activities which are currently difficult including sleeping, grooming/dressing, return to work Baseline:  Goal status: INITIAL  3.  The patient will have improved active shoulder elevation ROM to at least 130 degrees needed for grooming/dressing purposes as well as reaching high shelves  Baseline:  Goal status: INITIAL  4.  The  patient will be have shoulder external and internal rotation to 40 degrees for dressing Baseline:  Goal status: INITIAL  5.  The patient will have grossly 4/5 strength needed to lift and lower a 2# object from a high shelf  Baseline:  Goal status: INITIAL  6.  Quick DASH functional outcome score improved to 60% indicating improved function with less pain Baseline:  Goal status: INITIAL  PLAN:  PT FREQUENCY: 2x/week  PT DURATION: 12 weeks  PLANNED INTERVENTIONS: 97164- PT Re-evaluation, 97750- Physical Performance Testing, 97110-Therapeutic exercises, 97530- Therapeutic activity, 97112- Neuromuscular re-education, 97535- Self Care, [id number]- Manual therapy, [phone]- Aquatic Therapy, [id number]- Electrical stimulation (unattended), [phone]- Electrical stimulation (manual), 97016- Vasopneumatic device, Patient/Family education, Taping, Joint mobilization, Cryotherapy, and Moist heat  PLAN FOR NEXT SESSION: Passive shoulder ROM phase (elbow at 90 degrees); pendulums; glenohumeral and scapular mobilization; avoid elbow extension > 45 degrees secondary to tenodesis  Glade Pesa, PT 06/23/24 1:47 PM Phone: [phone] Fax: [phone]

## 2024-06-26 ENCOUNTER — Ambulatory Visit: Admitting: Physical Therapy

## 2024-06-26 ENCOUNTER — Encounter: Payer: Self-pay | Admitting: Physical Therapy

## 2024-06-26 DIAGNOSIS — R600 Localized edema: Secondary | ICD-10-CM | POA: Diagnosis not present

## 2024-06-26 DIAGNOSIS — M6281 Muscle weakness (generalized): Secondary | ICD-10-CM | POA: Diagnosis not present

## 2024-06-26 DIAGNOSIS — M25611 Stiffness of right shoulder, not elsewhere classified: Secondary | ICD-10-CM | POA: Diagnosis not present

## 2024-06-26 DIAGNOSIS — M25511 Pain in right shoulder: Secondary | ICD-10-CM

## 2024-06-26 NOTE — Therapy (Signed)
 OUTPATIENT PHYSICAL THERAPY SHOULDER TREATMENT   Patient Name: Caroline Gonzales MRN: [id number] DOB:[birth year], 48 y.o., female Today's Date: 06/26/2024  END OF SESSION:  PT End of Session - 06/26/24 1017     Visit Number 2    Date for Recertification  09/15/24    Authorization Type [employer]    PT Start Time 1017    PT Stop Time 1102    PT Time Calculation (min) 45 min    Activity Tolerance Patient limited by pain    Behavior During Therapy [facility] for tasks assessed/performed           Past Medical History:  Diagnosis Date   Acute costochondritis 11/14/2021   Allergy    Zyrtec .   Anxiety    followed by Dr. Melba   Back pain    Complication of anesthesia    PONV   Depression    Elevated blood pressure reading without diagnosis of hypertension 12/29/2021   Fibroid    Gastroparesis 09/14/2012   gastric emptying study in 2014   GERD (gastroesophageal reflux disease)    Headache    Hypertriglyceridemia 10/07/2017   Nightmares 07/21/2018   Pneumonia    2013ish   PONV (postoperative nausea and vomiting)     likes scopolamine  patch and zofran  /phenergan  helps   Pre-diabetes    PTSD (post-traumatic stress disorder)    Residual foreign body in soft tissue 11/12/2016   Screening for colon cancer 07/02/2022   Sleep apnea 07/01/2018   cpap optional, pt close to not use cpap   Vitamin D  deficiency    Wears glasses    Past Surgical History:  Procedure Laterality Date   ABDOMINAL HYSTERECTOMY  01/2021   ANKLE ARTHROSCOPY Left 2011   BICEPT TENODESIS Right 06/16/2024   Procedure: TENODESIS, BICEPS;  Surgeon: Sharl Selinda Dover, MD;  Location: [facility];  Service: Orthopedics;  Laterality: Right;   CESAREAN SECTION  06/2006   x 1   CHOLECYSTECTOMY  07/2006   laparoscopic   CYSTOSCOPY N/A 02/04/2021   Procedure: CYSTOSCOPY;  Surgeon: Jannis Kate Norris, MD;  Location: [facility] OR;  Service: Gynecology;  Laterality: N/A;   DILATION AND CURETTAGE OF UTERUS   1997   x 2   FOREIGN BODY REMOVAL Left 11/18/2016   Procedure: REMOVAL FOREIGN BODY EXTREMITY LEFT FOOT;  Surgeon: Donnice JONELLE Fees, DPM;  Location: [facility] OR;  Service: Podiatry;  Laterality: Left;   GASTRIC ROUX-EN-Y N/A 04/13/2023   Procedure: LAPAROSCOPIC ROUX-EN-Y GASTRIC BYPASS WITH UPPER ENDOSCOPY;  Surgeon: Signe Mitzie LABOR, MD;  Location: [facility];  Service: General;  Laterality: N/A;   KNEE ARTHROSCOPY WITH MEDIAL MENISECTOMY Right 09/10/2021   Procedure: KNEE ARTHROSCOPY WITH MEDIAL MENISCAL ROOT REPAIR;  Surgeon: Cristy Bonner DASEN, MD;  Location: [facility];  Service: Orthopedics;  Laterality: Right;   PILONIDAL CYST EXCISION  1990's   RADIOLOGY WITH ANESTHESIA N/A 11/10/2018   Procedure: MRI WITH ANESTHESIA OF BRAIN AND ORBITS WITH AND WITHOUT CONTRAST;  Surgeon: Radiologist, Medication, MD;  Location: [facility] OR;  Service: Radiology;  Laterality: N/A;   SHOULDER ARTHROSCOPY WITH ROTATOR CUFF REPAIR Right 06/16/2024   Procedure: ARTHROSCOPY, SHOULDER, WITH ROTATOR CUFF REPAIR;  Surgeon: Sharl Selinda Dover, MD;  Location: [facility];  Service: Orthopedics;  Laterality: Right;   SUBACROMIAL DECOMPRESSION Right 06/16/2024   Procedure: DECOMPRESSION, SUBACROMIAL SPACE;  Surgeon: Sharl Selinda Dover, MD;  Location:  SURGERY CENTER;  Service: Orthopedics;  Laterality: Right;  Right shoulder arthroscopy with rotator cuff  repair, biceps tenodesis, subacromial decompression, distal clavicle excision   TENDON REPAIR Left 2011   Left Ankle   TOTAL LAPAROSCOPIC HYSTERECTOMY WITH SALPINGECTOMY Bilateral 02/04/2021   Procedure: TOTAL LAPAROSCOPIC HYSTERECTOMY WITH SALPINGECTOMY;  Surgeon: Jannis Kate Norris, MD;  Location: [facility] OR;  Service: Gynecology;  Laterality: Bilateral;   UPPER GASTROINTESTINAL ENDOSCOPY     WISDOM TOOTH EXTRACTION  1990's   Patient Active Problem List   Diagnosis Date Noted   History of nocturnal hypoglycemia 03/02/2024   Encounter for  annual physical exam 03/02/2024   Family history of elevated lipoprotein (a) 04/26/2023   PVC's (premature ventricular contractions) 04/26/2023   Morbid obesity (HCC) 04/13/2023   Gastroesophageal reflux disease 12/24/2022   Abnormal laboratory test 12/24/2022   Chest wall pain 12/24/2022   Change in facial mole 12/29/2021   S/P laparoscopic hysterectomy 02/04/2021   PTSD (post-traumatic stress disorder) 07/21/2018   GAD (generalized anxiety disorder) 07/21/2018   Insomnia 07/21/2018   Recurrent major depressive disorder, in partial remission 07/21/2018   Insulin  resistance 10/21/2017   Hypertriglyceridemia 10/07/2017   Vitamin D  deficiency 10/07/2017   Pre-diabetes 11/02/2016   Morbid obesity with body mass index (BMI) of 50.0 to 59.9 in adult (HCC) 10/15/2016    PCP: Oris Credit NP  REFERRING PROVIDER: Sharl Mayo MD  REFERRING DIAG: s/p right shoulder scope with rotator cuff repair, biceps tenodesis and subacromial decompression  THERAPY DIAG: right shoulder pain; right shoulder stiffness; weakness   Rationale for Evaluation and Treatment: Rehabilitation  ONSET DATE: Spring 2025  SUBJECTIVE:                                                                                                                                                                                      SUBJECTIVE STATEMENT: I was enough pain on Sat to cry and need double the pain meds.  I am feeling like I really need to baby this shoulder. I am not sleeping b/c I can't get comfortable - I tried to sleep in the bed Fri night but moved to recliner within 1 hour. I am using the ice machine several times a day - my husband helps me set it up.  Hand dominance: Right  Date of surgery 06/16/2024 s/p right shoulder scope with rotator cuff repair, biceps tenodesis and subacromial decompression  PERTINENT HISTORY: Knee scope 12/29/23 Can't take NSAIDS b/c of gastric bypass  PAIN:   Are you having pain?  Yes NPRS scale: 10/10 on weekend before pain meds kicked in, currently 0/10 Pain location: anterior right shoulder, neck shoulder Pain orientation: Right  PAIN TYPE: aching Pain description: constant  Aggravating factors: post surgical pain mostly well managed Relieving  factors: medication   PRECAUTIONS: Shoulder surgical   WEIGHT BEARING RESTRICTIONS: No  FALLS:  Has patient fallen in last 6 months? 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  PLOF: Independent  PATIENT GOALS:baseline be 75% ; shopping, goes to [employer] stretching class in the water   NEXT MD VISIT:   OBJECTIVE:  Note: Objective measures were completed at Evaluation unless otherwise noted.  DIAGNOSTIC FINDINGS:  Full thickness tear of subscapularis and supraspinatus and biceps tear  PATIENT SURVEYS:  Quick Dash:  QUICK DASH  Please rate your ability do the following activities in the last week by selecting the number below the appropriate response.   Activities Rating  Open a tight or new jar.  5 = Unable  Do heavy household chores (e.g., wash walls, floors). 5 = Unable  Carry a shopping bag or briefcase 5 = Unable  Wash your back. 5 = Unable  Use a knife to cut food. 5 = Unable  Recreational activities in which you take some force or impact through your arm, shoulder or hand (e.g., golf, hammering, tennis, etc.). 5 = Unable  During the past week, to what extent has your arm, shoulder or hand problem interfered with your normal social activities with family, friends, neighbors or groups?  4 = Quite a bit  During the past week, were you limited in your work or other regular daily activities as a result of your arm, shoulder or hand problem? 5 = Unable  Rate the severity of the following symptoms in the last week: Arm, Shoulder, or hand pain. 4 = Severe  Rate  the severity of the following symptoms in the last week: Tingling (pins and needles) in your arm, shoulder or hand. 1 = none  During the past week, how much difficulty have you had sleeping because of the pain in your arm, shoulder or hand?  3 = Moderate difficulty   (A QuickDASH score may not be calculated if there is greater than 1 missing item.)  Quick Dash Disability/Symptom Score: 81.8%   Minimally Clinically Important Difference (MCID): 15-20 points  Flavio, F. et al. (2013). Minimally clinically important difference of the disabilities of the arm, shoulder, and hand outcome measures (DASH) and its shortened version (Quick DASH). Journal of Orthopaedic & Sports Physical Therapy, 44(1), 30-39)   COGNITION: Overall cognitive status: Within functional limits for tasks assessed     SENSATION: Denies numbness/tingling  APPEARANCE:  3 portal incisions with steri strips; no redness; no drainage; amount of edema consistent with 1 week post op    UPPER EXTREMITY ROM:  Passive ROM Right eval   Shoulder flexion 15   Shoulder extension    Shoulder abduction 10   Shoulder adduction    Shoulder extension    Shoulder internal rotation 45   Shoulder external rotation 0   Elbow flexion    Elbow extension    Wrist flexion    Wrist extension    Wrist ulnar deviation    Wrist radial deviation    Wrist pronation    Wrist supination     (Blank rows = not tested)   Active ROM Right eval Left eval  Shoulder flexion Not tested secondary to surgical precautions WFLS  Shoulder extension    Shoulder abduction  WFLS  Shoulder adduction    Shoulder internal rotation  WFLs  Shoulder external rotation  WFLs  Elbow flexion    Elbow extension    Wrist flexion    Wrist extension    Wrist ulnar deviation    Wrist radial deviation    Wrist pronation    Wrist supination    (Blank rows = not tested)  UPPER EXTREMITY MMT:  MMT Right eval Left eval  Shoulder flexion Not tested  secondary to surgical precautions 5  Shoulder extension  5  Shoulder abduction  5  Shoulder adduction    Shoulder internal rotation  5  Shoulder external rotation  5  Middle trapezius    Lower trapezius    Elbow flexion  5  Elbow extension  5  Wrist flexion  5  Wrist extension  5  Wrist ulnar deviation    Wrist radial deviation    Wrist pronation    Wrist supination    Grip strength (lbs)    (Blank rows = not tested)                                                                                                                              TREATMENT DATE:  06/26/24 Shoulder pendulum in sling with trunk strap unhooked (maintain elbow flexion) - Pt had too much pain and pulling across scapula Supine gentle shoulder approximation for relaxation before P/ROM Gentle P/ROM Rt shoulder ER to neutral (from sling position) and P/ROM scaption with elbow flexion maintained - to 80/90 deg - slow moving to avoid pain and guarding Supine neck retraction with rotation Rt/Lt 3x10 Supine scapular depression and retraction x10 each - Rt gentle Supine ball squeeze x2', 3lb digiflex x 1 min - Rt hand   06/23/24 Evaluation Gentle passive ROM flexion, abduction oscillations 15 degrees with care to avoid hyperextension and maintaining elbow 90 degrees Sling adjustment and discussed neutral positioning, avoidance of hyperextension, having abduction pillow in the right spot Discussed cervical ROM and gripping exs; OK to move fingers and wrist Discussed surgical precautions: avoid full elbow extension secondary to tenodesis, no active shoulder ROM/ passive only Scapular retractions    PATIENT EDUCATION: Education details: BERDINE, Educated patient on anatomy and physiology of current symptoms, prognosis, plan of care as well as initial self care strategies to promote recovery  Person educated: Patient Education method: Explanation Education comprehension: verbalized understanding  HOME  EXERCISE PROGRAM: Access Code: [email or web address] URL: [email or web address] Date: 06/26/2024 Prepared by: Orvil Kierstan Auer  Exercises - Supine Cervical Retraction with Towel  - 2 x daily - 7 x weekly - 1 sets - 10 reps - 10 hold - Supine Cervical Rotation AROM on Pillow  - 2 x daily - 7 x weekly - 1 sets - 5 reps - 10 hold - Seated Scapular Retraction  - 2 x daily - 7 x weekly - 1 sets - 10 reps  ASSESSMENT:  CLINICAL IMPRESSION: Pt reported 10/10 pain on Sat and was in tears.  Needed double the pain medication.  She still has not been  able to get comfortable in bed so continues to sleep in recliner.  Visual inspection of Rt shoulder demonstrated good healing, no redness or warmth.  She arrived with good pain control of 0/10.  She was unable to tolerate pendulums (stayed in elbow aspect of sling to protect bicep repair) secondary to posterior shoulder pain/pulling.  Very slow gentle approach to manual techniques for P/ROM in supine used today with ability to tolerate up to approx 90 deg elevation.  Initiated HEP for head/neck and hand aspects of HEP.  Held on pendulums for now due to pain with trial of this today.  Eval: Patient is a 48 y.o. female who was seen today for physical therapy evaluation and treatment 1 week s/p right shoulder scope with rotator cuff repair, biceps tenodesis and subacromial decompression.  The patient would benefit from skilled PT to address shoulder range of motion limitations while following surgical precautions. When appropriate will progress from passive ROM to active assisted to active ROM and progressive strengthening.  Her current impairments and pain affect ability to perform basic ADLs including personal hygiene, dressing and sleeping and she is unable to work at this time.   OBJECTIVE IMPAIRMENTS: decreased activity tolerance, decreased ROM, decreased strength, increased edema, impaired perceived functional ability, impaired UE functional use, and pain.    ACTIVITY LIMITATIONS: carrying, lifting, sleeping, bed mobility, bathing, toileting, dressing, reach over head, and hygiene/grooming  PARTICIPATION LIMITATIONS: meal prep, cleaning, laundry, driving, shopping, community activity, and occupation  PERSONAL FACTORS: Time since onset of injury/illness/exacerbation are also affecting patient's functional outcome.   REHAB POTENTIAL: Good  CLINICAL DECISION MAKING: Stable/uncomplicated  EVALUATION COMPLEXITY: Low   GOALS: Goals reviewed with patient? Yes  SHORT TERM GOALS: Target date: 08/04/2024     The patient will demonstrate knowledge of basic self care strategies and exercises to promote healing  Baseline: Goal status: INITIAL  2.  The patient will report a 40% improvement in pain levels with functional activities which are currently difficult including sleeping, dressing, bathing Baseline:  Goal status: INITIAL  3.  Passive elevation to 150 degrees needed for overhead reaching Baseline:  Goal status: INITIAL  4.  Quick DASH functional outcome score improved to 70% indicating improved function with less pain Baseline:  Goal status: INITIAL     LONG TERM GOALS: Target date: 09/15/2024    The patient will be independent in a safe self progression of a home exercise program to promote further recovery of function  Baseline:  Goal status: INITIAL  2.  The patient will report a 75% improvement in pain levels with functional activities which are currently difficult including sleeping, grooming/dressing, return to work Baseline:  Goal status: INITIAL  3.  The patient will have improved active shoulder elevation ROM to at least 130 degrees needed for grooming/dressing purposes as well as reaching high shelves  Baseline:  Goal status: INITIAL  4.  The patient will be have shoulder external and internal rotation to 40 degrees for dressing Baseline:  Goal status: INITIAL  5.  The patient will have grossly 4/5 strength  needed to lift and lower a 2# object from a high shelf  Baseline:  Goal status: INITIAL  6.  Quick DASH functional outcome score improved to 60% indicating improved function with less pain Baseline:  Goal status: INITIAL  PLAN:  PT FREQUENCY: 2x/week  PT DURATION: 12 weeks  PLANNED INTERVENTIONS: 97164- PT Re-evaluation, 97750- Physical Performance Testing, 97110-Therapeutic exercises, 97530- Therapeutic activity, [id number]- Neuromuscular re-education, 97535- Self Care, [id number]- Manual  therapy, 97113- Aquatic Therapy, [phone]- Electrical stimulation (unattended), [phone]- Electrical stimulation (manual), 02983- Vasopneumatic device, Patient/Family education, Taping, Joint mobilization, Cryotherapy, and Moist heat  PLAN FOR NEXT SESSION: Passive shoulder ROM phase (elbow at 90 degrees); pendulums; glenohumeral and scapular mobilization; avoid elbow extension > 45 degrees secondary to tenodesis  Cherlynn Popiel, PT 06/26/24 12:18 PM  Phone: [phone] Fax: [phone]

## 2024-06-27 DIAGNOSIS — F4310 Post-traumatic stress disorder, unspecified: Secondary | ICD-10-CM | POA: Diagnosis not present

## 2024-06-29 ENCOUNTER — Ambulatory Visit: Admitting: Physical Therapy

## 2024-06-29 DIAGNOSIS — M6281 Muscle weakness (generalized): Secondary | ICD-10-CM

## 2024-06-29 DIAGNOSIS — M25511 Pain in right shoulder: Secondary | ICD-10-CM

## 2024-06-29 DIAGNOSIS — R600 Localized edema: Secondary | ICD-10-CM | POA: Diagnosis not present

## 2024-06-29 DIAGNOSIS — M25611 Stiffness of right shoulder, not elsewhere classified: Secondary | ICD-10-CM

## 2024-06-29 NOTE — Therapy (Signed)
 OUTPATIENT PHYSICAL THERAPY SHOULDER TREATMENT   Patient Name: Caroline Gonzales MRN: [id number] DOB:1976-02-14, 48 y.o., female Today's Date: 06/29/2024  END OF SESSION:  PT End of Session - 06/29/24 1617     Visit Number 3    Date for Recertification  09/15/24    Authorization Type [employer]    PT Start Time 1616    PT Stop Time 1645    PT Time Calculation (min) 29 min    Activity Tolerance Patient limited by pain           Past Medical History:  Diagnosis Date   Acute costochondritis 11/14/2021   Allergy    Zyrtec .   Anxiety    followed by Dr. Melba   Back pain    Complication of anesthesia    PONV   Depression    Elevated blood pressure reading without diagnosis of hypertension 12/29/2021   Fibroid    Gastroparesis 09/14/2012   gastric emptying study in 2014   GERD (gastroesophageal reflux disease)    Headache    Hypertriglyceridemia 10/07/2017   Nightmares 07/21/2018   Pneumonia    2013ish   PONV (postoperative nausea and vomiting)     likes scopolamine  patch and zofran  /phenergan  helps   Pre-diabetes    PTSD (post-traumatic stress disorder)    Residual foreign body in soft tissue 11/12/2016   Screening for colon cancer 07/02/2022   Sleep apnea 07/01/2018   cpap optional, pt close to not use cpap   Vitamin D  deficiency    Wears glasses    Past Surgical History:  Procedure Laterality Date   ABDOMINAL HYSTERECTOMY  01/2021   ANKLE ARTHROSCOPY Left 2011   BICEPT TENODESIS Right 06/16/2024   Procedure: TENODESIS, BICEPS;  Surgeon: Sharl Selinda Dover, MD;  Location: [facility];  Service: Orthopedics;  Laterality: Right;   CESAREAN SECTION  06/2006   x 1   CHOLECYSTECTOMY  07/2006   laparoscopic   CYSTOSCOPY N/A 02/04/2021   Procedure: CYSTOSCOPY;  Surgeon: Jannis Kate Norris, MD;  Location: [facility] OR;  Service: Gynecology;  Laterality: N/A;   DILATION AND CURETTAGE OF UTERUS  1997   x 2   FOREIGN BODY REMOVAL Left 11/18/2016    Procedure: REMOVAL FOREIGN BODY EXTREMITY LEFT FOOT;  Surgeon: Donnice JONELLE Fees, DPM;  Location: [facility] OR;  Service: Podiatry;  Laterality: Left;   GASTRIC ROUX-EN-Y N/A 04/13/2023   Procedure: LAPAROSCOPIC ROUX-EN-Y GASTRIC BYPASS WITH UPPER ENDOSCOPY;  Surgeon: Signe Mitzie LABOR, MD;  Location: [facility];  Service: General;  Laterality: N/A;   KNEE ARTHROSCOPY WITH MEDIAL MENISECTOMY Right 09/10/2021   Procedure: KNEE ARTHROSCOPY WITH MEDIAL MENISCAL ROOT REPAIR;  Surgeon: Cristy Bonner DASEN, MD;  Location: [facility];  Service: Orthopedics;  Laterality: Right;   PILONIDAL CYST EXCISION  1990's   RADIOLOGY WITH ANESTHESIA N/A 11/10/2018   Procedure: MRI WITH ANESTHESIA OF BRAIN AND ORBITS WITH AND WITHOUT CONTRAST;  Surgeon: Radiologist, Medication, MD;  Location: [facility] OR;  Service: Radiology;  Laterality: N/A;   SHOULDER ARTHROSCOPY WITH ROTATOR CUFF REPAIR Right 06/16/2024   Procedure: ARTHROSCOPY, SHOULDER, WITH ROTATOR CUFF REPAIR;  Surgeon: Sharl Selinda Dover, MD;  Location: [facility];  Service: Orthopedics;  Laterality: Right;   SUBACROMIAL DECOMPRESSION Right 06/16/2024   Procedure: DECOMPRESSION, SUBACROMIAL SPACE;  Surgeon: Sharl Selinda Dover, MD;  Location: [facility];  Service: Orthopedics;  Laterality: Right;  Right shoulder arthroscopy with rotator cuff repair, biceps tenodesis, subacromial decompression, distal clavicle excision  TENDON REPAIR Left 2011   Left Ankle   TOTAL LAPAROSCOPIC HYSTERECTOMY WITH SALPINGECTOMY Bilateral 02/04/2021   Procedure: TOTAL LAPAROSCOPIC HYSTERECTOMY WITH SALPINGECTOMY;  Surgeon: Jannis Kate Norris, MD;  Location: [facility] OR;  Service: Gynecology;  Laterality: Bilateral;   UPPER GASTROINTESTINAL ENDOSCOPY     WISDOM TOOTH EXTRACTION  1990's   Patient Active Problem List   Diagnosis Date Noted   History of nocturnal hypoglycemia 03/02/2024   Encounter for annual physical exam 03/02/2024   Family history of  elevated lipoprotein (a) 04/26/2023   PVC's (premature ventricular contractions) 04/26/2023   Morbid obesity (HCC) 04/13/2023   Gastroesophageal reflux disease 12/24/2022   Abnormal laboratory test 12/24/2022   Chest wall pain 12/24/2022   Change in facial mole 12/29/2021   S/P laparoscopic hysterectomy 02/04/2021   PTSD (post-traumatic stress disorder) 07/21/2018   GAD (generalized anxiety disorder) 07/21/2018   Insomnia 07/21/2018   Recurrent major depressive disorder, in partial remission 07/21/2018   Insulin  resistance 10/21/2017   Hypertriglyceridemia 10/07/2017   Vitamin D  deficiency 10/07/2017   Pre-diabetes 11/02/2016   Morbid obesity with body mass index (BMI) of 50.0 to 59.9 in adult (HCC) 10/15/2016    PCP: Oris Credit NP  REFERRING PROVIDER: Sharl Mayo MD  REFERRING DIAG: s/p right shoulder scope with rotator cuff repair, biceps tenodesis and subacromial decompression  THERAPY DIAG: right shoulder pain; right shoulder stiffness; weakness   Rationale for Evaluation and Treatment: Rehabilitation  ONSET DATE: Spring 2025  SUBJECTIVE:                                                                                                                                                                                      SUBJECTIVE STATEMENT: I saw the doctor and he said I could sleep without the sling but with the pillows.  He said it was ok to have the arm out straight.  He said active motion in 6 weeks. I've been doing the ex's Orvil showed me and that really helps.   using the ice machine several times a day - my husband helps me set it up.  Next MD follow up mid Nov  Hand dominance: Right  Date of surgery 06/16/2024 s/p right shoulder scope with rotator cuff repair, biceps tenodesis and subacromial decompression  PERTINENT HISTORY: Knee scope 12/29/23 Can't take NSAIDS b/c of gastric bypass  PAIN:   Are you having pain? Yes NPRS scale: 1/10 Pain location:  anterior right shoulder, neck shoulder Pain orientation: Right  PAIN TYPE: aching Pain description: constant  Aggravating factors: post surgical pain mostly well managed Relieving factors: medication   PRECAUTIONS: Shoulder surgical   WEIGHT BEARING RESTRICTIONS: No  FALLS:  Has patient fallen in last 6 months? 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  PLOF: Independent  PATIENT GOALS:baseline be 75% ; shopping, goes to [employer] stretching class in the water   NEXT MD VISIT:   OBJECTIVE:  Note: Objective measures were completed at Evaluation unless otherwise noted.  DIAGNOSTIC FINDINGS:  Full thickness tear of subscapularis and supraspinatus and biceps tear  PATIENT SURVEYS:  Quick Dash:  QUICK DASH  Please rate your ability do the following activities in the last week by selecting the number below the appropriate response.   Activities Rating  Open a tight or new jar.  5 = Unable  Do heavy household chores (e.g., wash walls, floors). 5 = Unable  Carry a shopping bag or briefcase 5 = Unable  Wash your back. 5 = Unable  Use a knife to cut food. 5 = Unable  Recreational activities in which you take some force or impact through your arm, shoulder or hand (e.g., golf, hammering, tennis, etc.). 5 = Unable  During the past week, to what extent has your arm, shoulder or hand problem interfered with your normal social activities with family, friends, neighbors or groups?  4 = Quite a bit  During the past week, were you limited in your work or other regular daily activities as a result of your arm, shoulder or hand problem? 5 = Unable  Rate the severity of the following symptoms in the last week: Arm, Shoulder, or hand pain. 4 = Severe  Rate the severity of the following symptoms in the last week: Tingling (pins and needles) in your  arm, shoulder or hand. 1 = none  During the past week, how much difficulty have you had sleeping because of the pain in your arm, shoulder or hand?  3 = Moderate difficulty   (A QuickDASH score may not be calculated if there is greater than 1 missing item.)  Quick Dash Disability/Symptom Score: 81.8%   Minimally Clinically Important Difference (MCID): 15-20 points  Flavio, F. et al. (2013). Minimally clinically important difference of the disabilities of the arm, shoulder, and hand outcome measures (DASH) and its shortened version (Quick DASH). Journal of Orthopaedic & Sports Physical Therapy, 44(1), 30-39)   COGNITION: Overall cognitive status: Within functional limits for tasks assessed     SENSATION: Denies numbness/tingling  APPEARANCE:  3 portal incisions with steri strips; no redness; no drainage; amount of edema consistent with 1 week post op    UPPER EXTREMITY ROM:  Passive ROM Right eval   Shoulder flexion 15   Shoulder extension    Shoulder abduction 10   Shoulder adduction    Shoulder extension    Shoulder internal rotation 45   Shoulder external rotation 0   Elbow flexion    Elbow extension    Wrist flexion    Wrist extension    Wrist ulnar deviation    Wrist radial deviation    Wrist pronation    Wrist supination     (Blank rows = not tested)   Active ROM Right eval Left eval  Shoulder flexion Not tested secondary to surgical precautions WFLS  Shoulder extension    Shoulder abduction  WFLS  Shoulder adduction    Shoulder internal rotation  WFLs  Shoulder external rotation  WFLs  Elbow flexion    Elbow extension    Wrist flexion  Wrist extension    Wrist ulnar deviation    Wrist radial deviation    Wrist pronation    Wrist supination    (Blank rows = not tested)  UPPER EXTREMITY MMT:  MMT Right eval Left eval  Shoulder flexion Not tested secondary to surgical precautions 5  Shoulder extension  5  Shoulder abduction  5  Shoulder  adduction    Shoulder internal rotation  5  Shoulder external rotation  5  Middle trapezius    Lower trapezius    Elbow flexion  5  Elbow extension  5  Wrist flexion  5  Wrist extension  5  Wrist ulnar deviation    Wrist radial deviation    Wrist pronation    Wrist supination    Grip strength (lbs)    (Blank rows = not tested)                                                                                                                              TREATMENT DATE:  06/29/14: Supine propped on wedge: passive shoulder flexion 10 degrees, abduction 15 degrees, internal rotation 20 degrees 3 sets of 10 Seated scapular retraction 10x Review of cervical ROM and hand/finger ex's Standing pendulum 1st with other hand supporting the elbow, then arm dangling but not in full elbow extension gentle forward and back rocking in staggered stance 3 rounds of 5 Discussion on her husband assisting with gentle passive movements and where to support her arm   06/26/24 Shoulder pendulum in sling with trunk strap unhooked (maintain elbow flexion) - Pt had too much pain and pulling across scapula Supine gentle shoulder approximation for relaxation before P/ROM Gentle P/ROM Rt shoulder ER to neutral (from sling position) and P/ROM scaption with elbow flexion maintained - to 80/90 deg - slow moving to avoid pain and guarding Supine neck retraction with rotation Rt/Lt 3x10 Supine scapular depression and retraction x10 each - Rt gentle Supine ball squeeze x2', 3lb digiflex x 1 min - Rt hand   06/23/24 Evaluation Gentle passive ROM flexion, abduction oscillations 15 degrees with care to avoid hyperextension and maintaining elbow 90 degrees Sling adjustment and discussed neutral positioning, avoidance of hyperextension, having abduction pillow in the right spot Discussed cervical ROM and gripping exs; OK to move fingers and wrist Discussed surgical precautions: avoid full elbow extension secondary to  tenodesis, no active shoulder ROM/ passive only Scapular retractions    PATIENT EDUCATION: Education details: BERDINE, Educated patient on anatomy and physiology of current symptoms, prognosis, plan of care as well as initial self care strategies to promote recovery  Person educated: Patient Education method: Explanation Education comprehension: verbalized understanding  HOME EXERCISE PROGRAM: Access Code: [email or web address] URL: [email or web address] Date: 06/26/2024 Prepared by: Orvil Beuhring  Exercises - Supine Cervical Retraction with Towel  - 2 x daily - 7 x weekly - 1 sets - 10 reps - 10 hold - Supine Cervical Rotation AROM on Pillow  -  2 x daily - 7 x weekly - 1 sets - 5 reps - 10 hold - Seated Scapular Retraction  - 2 x daily - 7 x weekly - 1 sets - 10 reps  ASSESSMENT:  CLINICAL IMPRESSION: Pain much better controlled today.  She had a good follow up visit with the surgeon and she is allowed to have her arm out straight and sleeping without the sling.  Passive ROM continues to be very gentle  in all planes and following surgical precautions.  Therapist closely monitoring response throughout session.      Eval: Patient is a 48 y.o. female who was seen today for physical therapy evaluation and treatment 1 week s/p right shoulder scope with rotator cuff repair, biceps tenodesis and subacromial decompression.  The patient would benefit from skilled PT to address shoulder range of motion limitations while following surgical precautions. When appropriate will progress from passive ROM to active assisted to active ROM and progressive strengthening.  Her current impairments and pain affect ability to perform basic ADLs including personal hygiene, dressing and sleeping and she is unable to work at this time.   OBJECTIVE IMPAIRMENTS: decreased activity tolerance, decreased ROM, decreased strength, increased edema, impaired perceived functional ability, impaired UE functional  use, and pain.   ACTIVITY LIMITATIONS: carrying, lifting, sleeping, bed mobility, bathing, toileting, dressing, reach over head, and hygiene/grooming  PARTICIPATION LIMITATIONS: meal prep, cleaning, laundry, driving, shopping, community activity, and occupation  PERSONAL FACTORS: Time since onset of injury/illness/exacerbation are also affecting patient's functional outcome.   REHAB POTENTIAL: Good  CLINICAL DECISION MAKING: Stable/uncomplicated  EVALUATION COMPLEXITY: Low   GOALS: Goals reviewed with patient? Yes  SHORT TERM GOALS: Target date: 08/04/2024     The patient will demonstrate knowledge of basic self care strategies and exercises to promote healing  Baseline: Goal status: INITIAL  2.  The patient will report a 40% improvement in pain levels with functional activities which are currently difficult including sleeping, dressing, bathing Baseline:  Goal status: INITIAL  3.  Passive elevation to 150 degrees needed for overhead reaching Baseline:  Goal status: INITIAL  4.  Quick DASH functional outcome score improved to 70% indicating improved function with less pain Baseline:  Goal status: INITIAL     LONG TERM GOALS: Target date: 09/15/2024    The patient will be independent in a safe self progression of a home exercise program to promote further recovery of function  Baseline:  Goal status: INITIAL  2.  The patient will report a 75% improvement in pain levels with functional activities which are currently difficult including sleeping, grooming/dressing, return to work Baseline:  Goal status: INITIAL  3.  The patient will have improved active shoulder elevation ROM to at least 130 degrees needed for grooming/dressing purposes as well as reaching high shelves  Baseline:  Goal status: INITIAL  4.  The patient will be have shoulder external and internal rotation to 40 degrees for dressing Baseline:  Goal status: INITIAL  5.  The patient will have grossly  4/5 strength needed to lift and lower a 2# object from a high shelf  Baseline:  Goal status: INITIAL  6.  Quick DASH functional outcome score improved to 60% indicating improved function with less pain Baseline:  Goal status: INITIAL  PLAN:  PT FREQUENCY: 2x/week  PT DURATION: 12 weeks  PLANNED INTERVENTIONS: 97164- PT Re-evaluation, 97750- Physical Performance Testing, 97110-Therapeutic exercises, 97530- Therapeutic activity, [id number]- Neuromuscular re-education, 97535- Self Care, [id number]- Manual therapy, [id number]- Aquatic Therapy, [id number]- Electrical  stimulation (unattended), [id number]- Electrical stimulation (manual), [id number]- Vasopneumatic device, Patient/Family education, Taping, Joint mobilization, Cryotherapy, and Moist heat  PLAN FOR NEXT SESSION: Passive shoulder ROM phase (elbow at 90 degrees); pendulums; glenohumeral and scapular mobilization  Glade Pesa, PT 06/29/24 5:00 PM Phone: [phone] Fax: [phone]

## 2024-07-01 ENCOUNTER — Other Ambulatory Visit (HOSPITAL_COMMUNITY): Payer: Self-pay

## 2024-07-03 ENCOUNTER — Other Ambulatory Visit (HOSPITAL_COMMUNITY): Payer: Self-pay

## 2024-07-03 ENCOUNTER — Ambulatory Visit: Admitting: Rehabilitative and Restorative Service Providers"

## 2024-07-03 ENCOUNTER — Encounter: Payer: Self-pay | Admitting: Rehabilitative and Restorative Service Providers"

## 2024-07-03 DIAGNOSIS — M25511 Pain in right shoulder: Secondary | ICD-10-CM

## 2024-07-03 DIAGNOSIS — M25611 Stiffness of right shoulder, not elsewhere classified: Secondary | ICD-10-CM | POA: Diagnosis not present

## 2024-07-03 DIAGNOSIS — M6281 Muscle weakness (generalized): Secondary | ICD-10-CM | POA: Diagnosis not present

## 2024-07-03 DIAGNOSIS — R600 Localized edema: Secondary | ICD-10-CM | POA: Diagnosis not present

## 2024-07-03 MED ORDER — OXYCODONE HCL 5 MG PO TABS
ORAL_TABLET | ORAL | 0 refills | Status: AC
  Filled 2024-07-03: qty 20, 5d supply, fill #0

## 2024-07-03 MED ORDER — CYCLOBENZAPRINE HCL 5 MG PO TABS
5.0000 mg | ORAL_TABLET | Freq: Three times a day (TID) | ORAL | 1 refills | Status: DC
  Filled 2024-07-03: qty 40, 14d supply, fill #0
  Filled 2024-07-13 – 2024-07-14 (×2): qty 40, 14d supply, fill #1

## 2024-07-03 NOTE — Therapy (Signed)
 OUTPATIENT PHYSICAL THERAPY SHOULDER TREATMENT   Patient Name: Caroline Gonzales MRN: [id number] DOB:1976-04-15, 48 y.o., female Today's Date: 07/03/2024  END OF SESSION:  PT End of Session - 07/03/24 1049     Visit Number 4    Date for Recertification  09/15/24    Authorization Type [employer]    PT Start Time 1050    PT Stop Time 1145    PT Time Calculation (min) 55 min    Activity Tolerance Patient limited by pain    Behavior During Therapy [facility] for tasks assessed/performed           Past Medical History:  Diagnosis Date   Acute costochondritis 11/14/2021   Allergy    Zyrtec .   Anxiety    followed by Dr. Melba   Back pain    Complication of anesthesia    PONV   Depression    Elevated blood pressure reading without diagnosis of hypertension 12/29/2021   Fibroid    Gastroparesis 09/14/2012   gastric emptying study in 2014   GERD (gastroesophageal reflux disease)    Headache    Hypertriglyceridemia 10/07/2017   Nightmares 07/21/2018   Pneumonia    2013ish   PONV (postoperative nausea and vomiting)     likes scopolamine  patch and zofran  /phenergan  helps   Pre-diabetes    PTSD (post-traumatic stress disorder)    Residual foreign body in soft tissue 11/12/2016   Screening for colon cancer 07/02/2022   Sleep apnea 07/01/2018   cpap optional, pt close to not use cpap   Vitamin D  deficiency    Wears glasses    Past Surgical History:  Procedure Laterality Date   ABDOMINAL HYSTERECTOMY  01/2021   ANKLE ARTHROSCOPY Left 2011   BICEPT TENODESIS Right 06/16/2024   Procedure: TENODESIS, BICEPS;  Surgeon: Sharl Selinda Dover, MD;  Location: [facility];  Service: Orthopedics;  Laterality: Right;   CESAREAN SECTION  06/2006   x 1   CHOLECYSTECTOMY  07/2006   laparoscopic   CYSTOSCOPY N/A 02/04/2021   Procedure: CYSTOSCOPY;  Surgeon: Jannis Kate Norris, MD;  Location: [facility] OR;  Service: Gynecology;  Laterality: N/A;   DILATION AND CURETTAGE OF UTERUS   1997   x 2   FOREIGN BODY REMOVAL Left 11/18/2016   Procedure: REMOVAL FOREIGN BODY EXTREMITY LEFT FOOT;  Surgeon: Donnice JONELLE Fees, DPM;  Location: [facility] OR;  Service: Podiatry;  Laterality: Left;   GASTRIC ROUX-EN-Y N/A 04/13/2023   Procedure: LAPAROSCOPIC ROUX-EN-Y GASTRIC BYPASS WITH UPPER ENDOSCOPY;  Surgeon: Signe Mitzie LABOR, MD;  Location: [facility];  Service: General;  Laterality: N/A;   KNEE ARTHROSCOPY WITH MEDIAL MENISECTOMY Right 09/10/2021   Procedure: KNEE ARTHROSCOPY WITH MEDIAL MENISCAL ROOT REPAIR;  Surgeon: Cristy Bonner DASEN, MD;  Location: [facility];  Service: Orthopedics;  Laterality: Right;   PILONIDAL CYST EXCISION  1990's   RADIOLOGY WITH ANESTHESIA N/A 11/10/2018   Procedure: MRI WITH ANESTHESIA OF BRAIN AND ORBITS WITH AND WITHOUT CONTRAST;  Surgeon: Radiologist, Medication, MD;  Location: [facility] OR;  Service: Radiology;  Laterality: N/A;   SHOULDER ARTHROSCOPY WITH ROTATOR CUFF REPAIR Right 06/16/2024   Procedure: ARTHROSCOPY, SHOULDER, WITH ROTATOR CUFF REPAIR;  Surgeon: Sharl Selinda Dover, MD;  Location: [facility];  Service: Orthopedics;  Laterality: Right;   SUBACROMIAL DECOMPRESSION Right 06/16/2024   Procedure: DECOMPRESSION, SUBACROMIAL SPACE;  Surgeon: Sharl Selinda Dover, MD;  Location: [facility];  Service: Orthopedics;  Laterality: Right;  Right shoulder arthroscopy with rotator cuff  repair, biceps tenodesis, subacromial decompression, distal clavicle excision   TENDON REPAIR Left 2011   Left Ankle   TOTAL LAPAROSCOPIC HYSTERECTOMY WITH SALPINGECTOMY Bilateral 02/04/2021   Procedure: TOTAL LAPAROSCOPIC HYSTERECTOMY WITH SALPINGECTOMY;  Surgeon: Jannis Kate Norris, MD;  Location[facility] OR;  Service: Gynecology;  Laterality: Bilateral;   UPPER GASTROINTESTINAL ENDOSCOPY     WISDOM TOOTH EXTRACTION  1990's   Patient Active Problem List   Diagnosis Date Noted   History of nocturnal hypoglycemia 03/02/2024   Encounter for  annual physical exam 03/02/2024   Family history of elevated lipoprotein (a) 04/26/2023   PVC's (premature ventricular contractions) 04/26/2023   Morbid obesity (HCC) 04/13/2023   Gastroesophageal reflux disease 12/24/2022   Abnormal laboratory test 12/24/2022   Chest wall pain 12/24/2022   Change in facial mole 12/29/2021   S/P laparoscopic hysterectomy 02/04/2021   PTSD (post-traumatic stress disorder) 07/21/2018   GAD (generalized anxiety disorder) 07/21/2018   Insomnia 07/21/2018   Recurrent major depressive disorder, in partial remission 07/21/2018   Insulin  resistance 10/21/2017   Hypertriglyceridemia 10/07/2017   Vitamin D  deficiency 10/07/2017   Pre-diabetes 11/02/2016   Morbid obesity with body mass index (BMI) of 50.0 to 59.9 in adult [facility]) 10/15/2016    PCP: Oris Credit NP  REFERRING PROVIDER: Sharl Mayo MD  REFERRING DIAG: s/p right shoulder scope with rotator cuff repair, biceps tenodesis and subacromial decompression  THERAPY DIAG: right shoulder pain; right shoulder stiffness; weakness   Rationale for Evaluation and Treatment: Rehabilitation  ONSET DATE: Spring 2025  SUBJECTIVE:                                                                                                                                                                                      SUBJECTIVE STATEMENT: Pt reports that she did not take pain medication prior to visit secondary to having to drive herself today.  She plans to take medication after visit.  using the ice machine several times a day - my husband helps me set it up.  Next MD follow up mid Nov  Hand dominance: Right  Date of surgery 06/16/2024 s/p right shoulder scope with rotator cuff repair, biceps tenodesis and subacromial decompression Pt reports that MD advised her that she could sleep with sling off with pillow support and that she may have her arm straight with support A/ROM to begin at 6 weeks  PERTINENT  HISTORY: Right shoulder scope with rotator cuff repair, biceps tenodesis, and subacromial decompression on 06/16/24 Knee scope 12/29/23 Can't take NSAIDS b/c of gastric bypass  PAIN:   Are you having pain? Yes NPRS scale: 4/10 Pain location: anterior right shoulder, neck shoulder Pain orientation:  Right  PAIN TYPE: aching Pain description: constant  Aggravating factors: post surgical pain mostly well managed Relieving factors: medication   PRECAUTIONS: Shoulder surgical   WEIGHT BEARING RESTRICTIONS: No  FALLS:  Has patient fallen in last 6 months? 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  PLOF: Independent  PATIENT GOALS:baseline be 75% ; shopping, goes to [employer] stretching class in the [place]   NEXT MD VISIT:   OBJECTIVE:  Note: Objective measures were completed at Evaluation unless otherwise noted.  DIAGNOSTIC FINDINGS:  Full thickness tear of subscapularis and supraspinatus and biceps tear  PATIENT SURVEYS:  Quick Dash:  QUICK DASH  Please rate your ability do the following activities in the last week by selecting the number below the appropriate response.   Activities Rating  Open a tight or new jar.  5 = Unable  Do heavy household chores (e.g., wash walls, floors). 5 = Unable  Carry a shopping bag or briefcase 5 = Unable  Wash your back. 5 = Unable  Use a knife to cut food. 5 = Unable  Recreational activities in which you take some force or impact through your arm, shoulder or hand (e.g., golf, hammering, tennis, etc.). 5 = Unable  During the past week, to what extent has your arm, shoulder or hand problem interfered with your normal social activities with family, friends, neighbors or groups?  4 = Quite a bit  During the past week, were you limited in your work or other regular daily activities as a result of  your arm, shoulder or hand problem? 5 = Unable  Rate the severity of the following symptoms in the last week: Arm, Shoulder, or hand pain. 4 = Severe  Rate the severity of the following symptoms in the last week: Tingling (pins and needles) in your arm, shoulder or hand. 1 = none  During the past week, how much difficulty have you had sleeping because of the pain in your arm, shoulder or hand?  3 = Moderate difficulty   (A QuickDASH score may not be calculated if there is greater than 1 missing item.)  Quick Dash Disability/Symptom Score: 81.8%   Minimally Clinically Important Difference (MCID): 15-20 points  Flavio, F. et al. (2013). Minimally clinically important difference of the disabilities of the arm, shoulder, and hand outcome measures (DASH) and its shortened version (Quick DASH). Journal of Orthopaedic & Sports Physical Therapy, 44(1), 30-39)   COGNITION: Overall cognitive status: Within functional limits for tasks assessed     SENSATION: Denies numbness/tingling  APPEARANCE:  3 portal incisions with steri strips; no redness; no drainage; amount of edema consistent with 1 week post op    UPPER EXTREMITY ROM:  Passive ROM Right eval   Shoulder flexion 15   Shoulder extension    Shoulder abduction 10   Shoulder adduction    Shoulder extension    Shoulder internal rotation 45   Shoulder external rotation 0   Elbow flexion    Elbow extension    Wrist flexion    Wrist extension    Wrist ulnar deviation    Wrist radial deviation    Wrist pronation    Wrist supination     (Blank rows = not tested)   Active ROM Right eval Left eval  Shoulder flexion Not tested secondary to surgical precautions WFLS  Shoulder extension    Shoulder abduction  WFLS  Shoulder adduction    Shoulder internal rotation  WFLs  Shoulder external rotation  WFLs  Elbow flexion    Elbow extension    Wrist flexion    Wrist extension    Wrist ulnar deviation    Wrist radial deviation     Wrist pronation    Wrist supination    (Blank rows = not tested)  UPPER EXTREMITY MMT:  MMT Right eval Left eval  Shoulder flexion Not tested secondary to surgical precautions 5  Shoulder extension  5  Shoulder abduction  5  Shoulder adduction    Shoulder internal rotation  5  Shoulder external rotation  5  Middle trapezius    Lower trapezius    Elbow flexion  5  Elbow extension  5  Wrist flexion  5  Wrist extension  5  Wrist ulnar deviation    Wrist radial deviation    Wrist pronation    Wrist supination    Grip strength (lbs)    (Blank rows = not tested)                                                                                                                              TREATMENT DATE:   07/03/2024: Seated cervical retraction 2x10 Seated cervical sidebend 2x10 Seated cervical rotation 2x10 Seated scapular retraction 2x10 Supine P/ROM to right shoulder in all planes of motion per surgical restrictions Seated grip strengthening with 5# digiflex 2x10 Seated wrist flexion/extension x10 Seated opening/closing hand x10 Seated cat/cow (with taking caution for shoulder use and mainly using thoracic ROM) x10 Review of HEP   06/29/14: Supine propped on wedge: passive shoulder flexion 10 degrees, abduction 15 degrees, internal rotation 20 degrees 3 sets of 10 Seated scapular retraction 10x Review of cervical ROM and hand/finger ex's Standing pendulum 1st with other hand supporting the elbow, then arm dangling but not in full elbow extension gentle forward and back rocking in staggered stance 3 rounds of 5 Discussion on her husband assisting with gentle passive movements and where to support her arm   06/26/24 Shoulder pendulum in sling with trunk strap unhooked (maintain elbow flexion) - Pt had too much pain and pulling across scapula Supine gentle shoulder approximation for relaxation before P/ROM Gentle P/ROM Rt shoulder ER to neutral (from sling  position) and P/ROM scaption with elbow flexion maintained - to 80/90 deg - slow moving to avoid pain and guarding Supine neck retraction with rotation Rt/Lt 3x10 Supine scapular depression and retraction x10 each - Rt gentle Supine ball squeeze x2', 3lb digiflex x 1 min - Rt hand    PATIENT EDUCATION: Education details: [employer], Educated patient on anatomy and physiology of current symptoms, prognosis, plan of care as well as initial self care strategies to promote recovery  Person educated: Patient Education method: Explanation Education comprehension: verbalized understanding  HOME EXERCISE PROGRAM: Access Code: [email or web address] URL: [email or web address] Date: 07/03/2024 Prepared by: Jarrell Laming  Exercises - Supine  Cervical Retraction with Towel  - 2 x daily - 7 x weekly - 1 sets - 10 reps - 10 hold - Supine Cervical Rotation AROM on Pillow  - 2 x daily - 7 x weekly - 1 sets - 5 reps - 10 hold - Seated Scapular Retraction  - 2 x daily - 7 x weekly - 1 sets - 10 reps - Seated Cervical Rotation AROM  - 1 x daily - 7 x weekly - 2 sets - 10 reps - Seated Cervical Sidebending AROM  - 1 x daily - 7 x weekly - 2 sets - 10 reps - Seated Cervical Retraction  - 1 x daily - 7 x weekly - 2 sets - 10 reps - Seated Gripping Towel  - 1 x daily - 7 x weekly - 2 sets - 10 reps - Wrist Flexion Extension AROM - Palms Down  - 1 x daily - 7 x weekly - 2 sets - 10 reps - Closing and Opening Hand With Shoulder Sling  - 1 x daily - 7 x weekly - 2 sets - 10 reps - Seated Shoulder Pendulum Exercise  - 1 x daily - 7 x weekly - 2 sets - 10 reps  ASSESSMENT:  CLINICAL IMPRESSION:  Shawni presents to skilled PT reporting increased pain, but did admit to not taking pain medication secondary to having to drive today. Patient with guarding noted with shoulder P/ROM.  Patient reports pain with pendulum motion, so educated on just letting her hand dangle for decompression and she reported less pain.   PT then provided gentle perturbations to arm and patient reported that this motion was much preferable to normal pendulums and did not increase pain.  During supine P/ROM, provided patient with some support to her shoulder and she was able to tolerate shoulder P/ROM within allowed range based on post-op instructions.  Patient provided with updated HEP to assist with cervical ROM.  Patient continues to require skilled PT to progress towards goal related activities.    Eval: Patient is a 48 y.o. female who was seen today for physical therapy evaluation and treatment 1 week s/p right shoulder scope with rotator cuff repair, biceps tenodesis and subacromial decompression.  The patient would benefit from skilled PT to address shoulder range of motion limitations while following surgical precautions. When appropriate will progress from passive ROM to active assisted to active ROM and progressive strengthening.  Her current impairments and pain affect ability to perform basic ADLs including personal hygiene, dressing and sleeping and she is unable to work at this time.   OBJECTIVE IMPAIRMENTS: decreased activity tolerance, decreased ROM, decreased strength, increased edema, impaired perceived functional ability, impaired UE functional use, and pain.   ACTIVITY LIMITATIONS: carrying, lifting, sleeping, bed mobility, bathing, toileting, dressing, reach over head, and hygiene/grooming  PARTICIPATION LIMITATIONS: meal prep, cleaning, laundry, driving, shopping, community activity, and occupation  PERSONAL FACTORS: Time since onset of injury/illness/exacerbation are also affecting patient's functional outcome.   REHAB POTENTIAL: Good  CLINICAL DECISION MAKING: Stable/uncomplicated  EVALUATION COMPLEXITY: Low   GOALS: Goals reviewed with patient? Yes  SHORT TERM GOALS: Target date: 08/04/2024     The patient will demonstrate knowledge of basic self care strategies and exercises to promote healing   Baseline: Goal status: Ongoing  2.  The patient will report a 40% improvement in pain levels with functional activities which are currently difficult including sleeping, dressing, bathing Baseline:  Goal status: INITIAL  3.  Passive elevation to 150 degrees needed  for overhead reaching Baseline:  Goal status: INITIAL  4.  Quick DASH functional outcome score improved to 70% indicating improved function with less pain Baseline:  Goal status: INITIAL     LONG TERM GOALS: Target date: 09/15/2024    The patient will be independent in a safe self progression of a home exercise program to promote further recovery of function  Baseline:  Goal status: INITIAL  2.  The patient will report a 75% improvement in pain levels with functional activities which are currently difficult including sleeping, grooming/dressing, return to work Baseline:  Goal status: INITIAL  3.  The patient will have improved active shoulder elevation ROM to at least 130 degrees needed for grooming/dressing purposes as well as reaching high shelves  Baseline:  Goal status: INITIAL  4.  The patient will be have shoulder external and internal rotation to 40 degrees for dressing Baseline:  Goal status: INITIAL  5.  The patient will have grossly 4/5 strength needed to lift and lower a 2# object from a high shelf  Baseline:  Goal status: INITIAL  6.  Quick DASH functional outcome score improved to 60% indicating improved function with less pain Baseline:  Goal status: INITIAL  PLAN:  PT FREQUENCY: 2x/week  PT DURATION: 12 weeks  PLANNED INTERVENTIONS: 97164- PT Re-evaluation, 97750- Physical Performance Testing, 97110-Therapeutic exercises, 97530- Therapeutic activity, 97112- Neuromuscular re-education, 97535- Self Care, [id number]- Manual therapy, [id number]- Aquatic Therapy, [id number]- Electrical stimulation (unattended), [phone]- Electrical stimulation (manual), 97016- Vasopneumatic device, Patient/Family education,  Taping, Joint mobilization, Cryotherapy, and Moist heat  PLAN FOR NEXT SESSION: Passive shoulder ROM phase (elbow at 90 degrees); pendulums; glenohumeral and scapular mobilization   Jarrell Laming, PT, DPT 07/03/24, 12:02 PM  [facility] [address] [place], [place] [address] Phone # [phone] Fax [phone]

## 2024-07-04 DIAGNOSIS — F4310 Post-traumatic stress disorder, unspecified: Secondary | ICD-10-CM | POA: Diagnosis not present

## 2024-07-05 ENCOUNTER — Encounter: Payer: Self-pay | Admitting: Physical Therapy

## 2024-07-05 ENCOUNTER — Ambulatory Visit: Admitting: Physical Therapy

## 2024-07-05 DIAGNOSIS — M25511 Pain in right shoulder: Secondary | ICD-10-CM | POA: Diagnosis not present

## 2024-07-05 DIAGNOSIS — M25611 Stiffness of right shoulder, not elsewhere classified: Secondary | ICD-10-CM | POA: Diagnosis not present

## 2024-07-05 DIAGNOSIS — M6281 Muscle weakness (generalized): Secondary | ICD-10-CM

## 2024-07-05 DIAGNOSIS — R600 Localized edema: Secondary | ICD-10-CM | POA: Diagnosis not present

## 2024-07-05 NOTE — Therapy (Signed)
 OUTPATIENT PHYSICAL THERAPY SHOULDER TREATMENT   Patient Name: Caroline Gonzales MRN: [id number] DOB:11/05/1975, 48 y.o., female Today's Date: 07/05/2024  END OF SESSION:  PT End of Session - 07/05/24 0844     Visit Number 5    Date for Recertification  09/15/24    Authorization Type [employer]    PT Start Time [phone]    PT Stop Time 0925    PT Time Calculation (min) 41 min    Activity Tolerance Patient limited by pain    Behavior During Therapy [facility] for tasks assessed/performed            Past Medical History:  Diagnosis Date   Acute costochondritis 11/14/2021   Allergy    Zyrtec .   Anxiety    followed by Dr. Melba   Back pain    Complication of anesthesia    PONV   Depression    Elevated blood pressure reading without diagnosis of hypertension 12/29/2021   Fibroid    Gastroparesis 09/14/2012   gastric emptying study in 2014   GERD (gastroesophageal reflux disease)    Headache    Hypertriglyceridemia 10/07/2017   Nightmares 07/21/2018   Pneumonia    2013ish   PONV (postoperative nausea and vomiting)     likes scopolamine  patch and zofran  /phenergan  helps   Pre-diabetes    PTSD (post-traumatic stress disorder)    Residual foreign body in soft tissue 11/12/2016   Screening for colon cancer 07/02/2022   Sleep apnea 07/01/2018   cpap optional, pt close to not use cpap   Vitamin D  deficiency    Wears glasses    Past Surgical History:  Procedure Laterality Date   ABDOMINAL HYSTERECTOMY  01/2021   ANKLE ARTHROSCOPY Left 2011   BICEPT TENODESIS Right 06/16/2024   Procedure: TENODESIS, BICEPS;  Surgeon: Sharl Selinda Dover, MD;  Location: [facility];  Service: Orthopedics;  Laterality: Right;   CESAREAN SECTION  06/2006   x 1   CHOLECYSTECTOMY  07/2006   laparoscopic   CYSTOSCOPY N/A 02/04/2021   Procedure: CYSTOSCOPY;  Surgeon: Jannis Kate Norris, MD;  Location: [facility] OR;  Service: Gynecology;  Laterality: N/A;   DILATION AND CURETTAGE OF UTERUS   1997   x 2   FOREIGN BODY REMOVAL Left 11/18/2016   Procedure: REMOVAL FOREIGN BODY EXTREMITY LEFT FOOT;  Surgeon: Donnice JONELLE Fees, DPM;  Location: [facility] OR;  Service: Podiatry;  Laterality: Left;   GASTRIC ROUX-EN-Y N/A 04/13/2023   Procedure: LAPAROSCOPIC ROUX-EN-Y GASTRIC BYPASS WITH UPPER ENDOSCOPY;  Surgeon: Signe Mitzie LABOR, MD;  Location: [facility];  Service: General;  Laterality: N/A;   KNEE ARTHROSCOPY WITH MEDIAL MENISECTOMY Right 09/10/2021   Procedure: KNEE ARTHROSCOPY WITH MEDIAL MENISCAL ROOT REPAIR;  Surgeon: Cristy Bonner DASEN, MD;  Location: [facility];  Service: Orthopedics;  Laterality: Right;   PILONIDAL CYST EXCISION  1990's   RADIOLOGY WITH ANESTHESIA N/A 11/10/2018   Procedure: MRI WITH ANESTHESIA OF BRAIN AND ORBITS WITH AND WITHOUT CONTRAST;  Surgeon: Radiologist, Medication, MD;  Location: [facility] OR;  Service: Radiology;  Laterality: N/A;   SHOULDER ARTHROSCOPY WITH ROTATOR CUFF REPAIR Right 06/16/2024   Procedure: ARTHROSCOPY, SHOULDER, WITH ROTATOR CUFF REPAIR;  Surgeon: Sharl Selinda Dover, MD;  Location: [facility];  Service: Orthopedics;  Laterality: Right;   SUBACROMIAL DECOMPRESSION Right 06/16/2024   Procedure: DECOMPRESSION, SUBACROMIAL SPACE;  Surgeon: Sharl Selinda Dover, MD;  Location: [facility];  Service: Orthopedics;  Laterality: Right;  Right shoulder arthroscopy with rotator  cuff repair, biceps tenodesis, subacromial decompression, distal clavicle excision   TENDON REPAIR Left 2011   Left Ankle   TOTAL LAPAROSCOPIC HYSTERECTOMY WITH SALPINGECTOMY Bilateral 02/04/2021   Procedure: TOTAL LAPAROSCOPIC HYSTERECTOMY WITH SALPINGECTOMY;  Surgeon: Jannis Kate Norris, MD;  Location: [facility] OR;  Service: Gynecology;  Laterality: Bilateral;   UPPER GASTROINTESTINAL ENDOSCOPY     WISDOM TOOTH EXTRACTION  1990's   Patient Active Problem List   Diagnosis Date Noted   History of nocturnal hypoglycemia 03/02/2024   Encounter for  annual physical exam 03/02/2024   Family history of elevated lipoprotein (a) 04/26/2023   PVC's (premature ventricular contractions) 04/26/2023   Morbid obesity (HCC) 04/13/2023   Gastroesophageal reflux disease 12/24/2022   Abnormal laboratory test 12/24/2022   Chest wall pain 12/24/2022   Change in facial mole 12/29/2021   S/P laparoscopic hysterectomy 02/04/2021   PTSD (post-traumatic stress disorder) 07/21/2018   GAD (generalized anxiety disorder) 07/21/2018   Insomnia 07/21/2018   Recurrent major depressive disorder, in partial remission 07/21/2018   Insulin  resistance 10/21/2017   Hypertriglyceridemia 10/07/2017   Vitamin D  deficiency 10/07/2017   Pre-diabetes 11/02/2016   Morbid obesity with body mass index (BMI) of 50.0 to 59.9 in adult (HCC) 10/15/2016    PCP: Oris Credit NP  REFERRING PROVIDER: Sharl Mayo MD  REFERRING DIAG: s/p right shoulder scope with rotator cuff repair, biceps tenodesis and subacromial decompression  THERAPY DIAG: right shoulder pain; right shoulder stiffness; weakness   Rationale for Evaluation and Treatment: Rehabilitation  ONSET DATE: Spring 2025  SUBJECTIVE:                                                                                                                                                                                      SUBJECTIVE STATEMENT: I haven't slept since I came on Monday.  I am ok when I am doing the exercises but then it hurts afterwards. It starts to hurt in front of shoulder and chest with scapular retractions.  using the ice machine several times a day - my husband helps me set it up.  Next MD follow up mid Nov  Hand dominance: Right  Date of surgery 06/16/2024 s/p right shoulder scope with rotator cuff repair, biceps tenodesis and subacromial decompression Pt reports that MD advised her that she could sleep with sling off with pillow support and that she may have her arm straight with support A/ROM to  begin at 6 weeks  PERTINENT HISTORY: Right shoulder scope with rotator cuff repair, biceps tenodesis, and subacromial decompression on 06/16/24 Knee scope 12/29/23 Can't take NSAIDS b/c of gastric bypass  PAIN:   Are you having pain? Yes NPRS scale:  8-9/10 Pain location: anterior right shoulder, neck shoulder Pain orientation: Right  PAIN TYPE: aching Pain description: constant  Aggravating factors: post surgical pain mostly well managed Relieving factors: medication   PRECAUTIONS: Shoulder surgical   WEIGHT BEARING RESTRICTIONS: No  FALLS:  Has patient fallen in last 6 months? 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  PLOF: Independent  PATIENT GOALS:baseline be 75% ; shopping, goes to [employer] stretching class in the [place]   NEXT MD VISIT:   OBJECTIVE:  Note: Objective measures were completed at Evaluation unless otherwise noted.  DIAGNOSTIC FINDINGS:  Full thickness tear of subscapularis and supraspinatus and biceps tear  PATIENT SURVEYS:  Quick Dash:  QUICK DASH  Please rate your ability do the following activities in the last week by selecting the number below the appropriate response.   Activities Rating  Open a tight or new jar.  5 = Unable  Do heavy household chores (e.g., wash walls, floors). 5 = Unable  Carry a shopping bag or briefcase 5 = Unable  Wash your back. 5 = Unable  Use a knife to cut food. 5 = Unable  Recreational activities in which you take some force or impact through your arm, shoulder or hand (e.g., golf, hammering, tennis, etc.). 5 = Unable  During the past week, to what extent has your arm, shoulder or hand problem interfered with your normal social activities with family, friends, neighbors or groups?  4 = Quite a bit  During the past week, were you limited in your work or other regular  daily activities as a result of your arm, shoulder or hand problem? 5 = Unable  Rate the severity of the following symptoms in the last week: Arm, Shoulder, or hand pain. 4 = Severe  Rate the severity of the following symptoms in the last week: Tingling (pins and needles) in your arm, shoulder or hand. 1 = none  During the past week, how much difficulty have you had sleeping because of the pain in your arm, shoulder or hand?  3 = Moderate difficulty   (A QuickDASH score may not be calculated if there is greater than 1 missing item.)  Quick Dash Disability/Symptom Score: 81.8%   Minimally Clinically Important Difference (MCID): 15-20 points  Flavio, F. et al. (2013). Minimally clinically important difference of the disabilities of the arm, shoulder, and hand outcome measures (DASH) and its shortened version (Quick DASH). Journal of Orthopaedic & Sports Physical Therapy, 44(1), 30-39)   COGNITION: Overall cognitive status: Within functional limits for tasks assessed     SENSATION: Denies numbness/tingling  APPEARANCE:  3 portal incisions with steri strips; no redness; no drainage; amount of edema consistent with 1 week post op    UPPER EXTREMITY ROM:  Passive ROM Right eval   Shoulder flexion 15   Shoulder extension    Shoulder abduction 10   Shoulder adduction    Shoulder extension    Shoulder internal rotation 45   Shoulder external rotation 0   Elbow flexion    Elbow extension    Wrist flexion    Wrist extension    Wrist ulnar deviation    Wrist radial deviation    Wrist pronation    Wrist supination     (Blank rows = not tested)   Active ROM Right eval Left eval  Shoulder flexion Not tested secondary to surgical  precautions WFLS  Shoulder extension    Shoulder abduction  WFLS  Shoulder adduction    Shoulder internal rotation  WFLs  Shoulder external rotation  WFLs  Elbow flexion    Elbow extension    Wrist flexion    Wrist extension    Wrist ulnar  deviation    Wrist radial deviation    Wrist pronation    Wrist supination    (Blank rows = not tested)  UPPER EXTREMITY MMT:  MMT Right eval Left eval  Shoulder flexion Not tested secondary to surgical precautions 5  Shoulder extension  5  Shoulder abduction  5  Shoulder adduction    Shoulder internal rotation  5  Shoulder external rotation  5  Middle trapezius    Lower trapezius    Elbow flexion  5  Elbow extension  5  Wrist flexion  5  Wrist extension  5  Wrist ulnar deviation    Wrist radial deviation    Wrist pronation    Wrist supination    Grip strength (lbs)    (Blank rows = not tested)                                                                                                                              TREATMENT DATE:  07/05/24: Chair massage to Rt upper trap, intrascpular region, TP release to levator scap, gentle strumming to Rt scalenes, SCM and pectorals Lt SL: Rt scapular mobilizations - retraction, circles to modulate pain and improve relaxation, reduce guarding Lt SL: P/ROM Rt UE scaption to 80 deg, ER to neutral away from sling position Pt educ: every 2 hours of upright time take 30 min in recliner out of sling for postural unweighting, modifiy scap retractions to smaller range  07/03/2024: Seated cervical retraction 2x10 Seated cervical sidebend 2x10 Seated cervical rotation 2x10 Seated scapular retraction 2x10 Supine P/ROM to right shoulder in all planes of motion per surgical restrictions Seated grip strengthening with 5# digiflex 2x10 Seated wrist flexion/extension x10 Seated opening/closing hand x10 Seated cat/cow (with taking caution for shoulder use and mainly using thoracic ROM) x10 Review of HEP   06/29/14: Supine propped on wedge: passive shoulder flexion 10 degrees, abduction 15 degrees, internal rotation 20 degrees 3 sets of 10 Seated scapular retraction 10x Review of cervical ROM and hand/finger ex's Standing pendulum 1st  with other hand supporting the elbow, then arm dangling but not in full elbow extension gentle forward and back rocking in staggered stance 3 rounds of 5 Discussion on her husband assisting with gentle passive movements and where to support her arm   06/26/24 Shoulder pendulum in sling with trunk strap unhooked (maintain elbow flexion) - Pt had too much pain and pulling across scapula Supine gentle shoulder approximation for relaxation before P/ROM Gentle P/ROM Rt shoulder ER to neutral (from sling position) and P/ROM scaption with elbow flexion maintained - to 80/90 deg - slow moving to avoid pain and guarding  Supine neck retraction with rotation Rt/Lt 3x10 Supine scapular depression and retraction x10 each - Rt gentle Supine ball squeeze x2', 3lb digiflex x 1 min - Rt hand    PATIENT EDUCATION: Education details: [employer], Educated patient on anatomy and physiology of current symptoms, prognosis, plan of care as well as initial self care strategies to promote recovery  Person educated: Patient Education method: Explanation Education comprehension: verbalized understanding  HOME EXERCISE PROGRAM: Access Code: [email or web address] URL: [email or web address] Date: 07/03/2024 Prepared by: Jarrell Menke  Exercises - Supine Cervical Retraction with Towel  - 2 x daily - 7 x weekly - 1 sets - 10 reps - 10 hold - Supine Cervical Rotation AROM on Pillow  - 2 x daily - 7 x weekly - 1 sets - 5 reps - 10 hold - Seated Scapular Retraction  - 2 x daily - 7 x weekly - 1 sets - 10 reps - Seated Cervical Rotation AROM  - 1 x daily - 7 x weekly - 2 sets - 10 reps - Seated Cervical Sidebending AROM  - 1 x daily - 7 x weekly - 2 sets - 10 reps - Seated Cervical Retraction  - 1 x daily - 7 x weekly - 2 sets - 10 reps - Seated Gripping Towel  - 1 x daily - 7 x weekly - 2 sets - 10 reps - Wrist Flexion Extension AROM - Palms Down  - 1 x daily - 7 x weekly - 2 sets - 10 reps - Closing and Opening  Hand With Shoulder Sling  - 1 x daily - 7 x weekly - 2 sets - 10 reps - Seated Shoulder Pendulum Exercise  - 1 x daily - 7 x weekly - 2 sets - 10 reps  ASSESSMENT:  CLINICAL IMPRESSION: Lara has tried to sleep in bed with pillow propping but is more comfortable in recliner.  She will try HEP at home and is ok when doing it but then gets into terrible pain.  She presented with signif guarding, TP and tone along posterior neck and scapular region today.  We started with chair STM and scapular mobs which signif reduced guarding and allowed for comfortable P/ROM.  Pt is meeting ROM goals for this stage of protocol.  She will continue to benefit from skilled PT along protocol and POC    Eval: Patient is a 48 y.o. female who was seen today for physical therapy evaluation and treatment 1 week s/p right shoulder scope with rotator cuff repair, biceps tenodesis and subacromial decompression.  The patient would benefit from skilled PT to address shoulder range of motion limitations while following surgical precautions. When appropriate will progress from passive ROM to active assisted to active ROM and progressive strengthening.  Her current impairments and pain affect ability to perform basic ADLs including personal hygiene, dressing and sleeping and she is unable to work at this time.   OBJECTIVE IMPAIRMENTS: decreased activity tolerance, decreased ROM, decreased strength, increased edema, impaired perceived functional ability, impaired UE functional use, and pain.   ACTIVITY LIMITATIONS: carrying, lifting, sleeping, bed mobility, bathing, toileting, dressing, reach over head, and hygiene/grooming  PARTICIPATION LIMITATIONS: meal prep, cleaning, laundry, driving, shopping, community activity, and occupation  PERSONAL FACTORS: Time since onset of injury/illness/exacerbation are also affecting patient's functional outcome.   REHAB POTENTIAL: Good  CLINICAL DECISION MAKING:  Stable/uncomplicated  EVALUATION COMPLEXITY: Low   GOALS: Goals reviewed with patient? Yes  SHORT TERM GOALS: Target date: 08/04/2024     The patient will  demonstrate knowledge of basic self care strategies and exercises to promote healing  Baseline: Goal status: Ongoing  2.  The patient will report a 40% improvement in pain levels with functional activities which are currently difficult including sleeping, dressing, bathing Baseline:  Goal status: INITIAL  3.  Passive elevation to 150 degrees needed for overhead reaching Baseline:  Goal status: INITIAL  4.  Quick DASH functional outcome score improved to 70% indicating improved function with less pain Baseline:  Goal status: INITIAL     LONG TERM GOALS: Target date: 09/15/2024    The patient will be independent in a safe self progression of a home exercise program to promote further recovery of function  Baseline:  Goal status: INITIAL  2.  The patient will report a 75% improvement in pain levels with functional activities which are currently difficult including sleeping, grooming/dressing, return to work Baseline:  Goal status: INITIAL  3.  The patient will have improved active shoulder elevation ROM to at least 130 degrees needed for grooming/dressing purposes as well as reaching high shelves  Baseline:  Goal status: INITIAL  4.  The patient will be have shoulder external and internal rotation to 40 degrees for dressing Baseline:  Goal status: INITIAL  5.  The patient will have grossly 4/5 strength needed to lift and lower a 2# object from a high shelf  Baseline:  Goal status: INITIAL  6.  Quick DASH functional outcome score improved to 60% indicating improved function with less pain Baseline:  Goal status: INITIAL  PLAN:  PT FREQUENCY: 2x/week  PT DURATION: 12 weeks  PLANNED INTERVENTIONS: 97164- PT Re-evaluation, 97750- Physical Performance Testing, 97110-Therapeutic exercises, 97530- Therapeutic  activity, 97112- Neuromuscular re-education, 97535- Self Care, [id number]- Manual therapy, [id number]- Aquatic Therapy, [id number]- Electrical stimulation (unattended), [phone]- Electrical stimulation (manual), 97016- Vasopneumatic device, Patient/Family education, Taping, Joint mobilization, Cryotherapy, and Moist heat  PLAN FOR NEXT SESSION: Passive shoulder ROM phase; pendulums; glenohumeral and scapular mobilization   Orvil Fester, PT 07/05/24 9:35 AM   [facility] [address] [place], [place] [address] Phone # [phone] Fax [phone]

## 2024-07-08 ENCOUNTER — Other Ambulatory Visit (HOSPITAL_COMMUNITY): Payer: Self-pay

## 2024-07-08 MED ORDER — FLUZONE 0.5 ML IM SUSY
0.5000 mL | PREFILLED_SYRINGE | Freq: Once | INTRAMUSCULAR | 0 refills | Status: AC
  Filled 2024-07-08: qty 0.5, 1d supply, fill #0

## 2024-07-08 MED ORDER — COMIRNATY 30 MCG/0.3ML IM SUSY
0.3000 mL | PREFILLED_SYRINGE | Freq: Once | INTRAMUSCULAR | 0 refills | Status: AC
  Filled 2024-07-08: qty 0.3, 1d supply, fill #0

## 2024-07-10 ENCOUNTER — Ambulatory Visit: Admitting: Physical Therapy

## 2024-07-10 ENCOUNTER — Encounter: Payer: Self-pay | Admitting: Physical Therapy

## 2024-07-10 DIAGNOSIS — M25611 Stiffness of right shoulder, not elsewhere classified: Secondary | ICD-10-CM

## 2024-07-10 DIAGNOSIS — M6281 Muscle weakness (generalized): Secondary | ICD-10-CM

## 2024-07-10 DIAGNOSIS — M25511 Pain in right shoulder: Secondary | ICD-10-CM | POA: Diagnosis not present

## 2024-07-10 DIAGNOSIS — R600 Localized edema: Secondary | ICD-10-CM | POA: Diagnosis not present

## 2024-07-10 NOTE — Therapy (Signed)
 OUTPATIENT PHYSICAL THERAPY SHOULDER TREATMENT   Patient Name: Caroline Gonzales MRN: [id number] DOB:27-Jun-1976, 48 y.o., female Today's Date: 07/10/2024  END OF SESSION:  PT End of Session - 07/10/24 1009     Visit Number 6    Date for Recertification  09/15/24    Authorization Type [employer]    PT Start Time 1015    PT Stop Time 1055    PT Time Calculation (min) 40 min    Activity Tolerance Patient limited by pain    Behavior During Therapy [facility] for tasks assessed/performed            Past Medical History:  Diagnosis Date   Acute costochondritis 11/14/2021   Allergy    Zyrtec .   Anxiety    followed by Dr. Melba   Back pain    Complication of anesthesia    PONV   Depression    Elevated blood pressure reading without diagnosis of hypertension 12/29/2021   Fibroid    Gastroparesis 09/14/2012   gastric emptying study in 2014   GERD (gastroesophageal reflux disease)    Headache    Hypertriglyceridemia 10/07/2017   Nightmares 07/21/2018   Pneumonia    2013ish   PONV (postoperative nausea and vomiting)     likes scopolamine  patch and zofran  /phenergan  helps   Pre-diabetes    PTSD (post-traumatic stress disorder)    Residual foreign body in soft tissue 11/12/2016   Screening for colon cancer 07/02/2022   Sleep apnea 07/01/2018   cpap optional, pt close to not use cpap   Vitamin D  deficiency    Wears glasses    Past Surgical History:  Procedure Laterality Date   ABDOMINAL HYSTERECTOMY  01/2021   ANKLE ARTHROSCOPY Left 2011   BICEPT TENODESIS Right 06/16/2024   Procedure: TENODESIS, BICEPS;  Surgeon: Sharl Selinda Dover, MD;  Location: [facility];  Service: Orthopedics;  Laterality: Right;   CESAREAN SECTION  06/2006   x 1   CHOLECYSTECTOMY  07/2006   laparoscopic   CYSTOSCOPY N/A 02/04/2021   Procedure: CYSTOSCOPY;  Surgeon: Jannis Kate Norris, MD;  Location[facility] OR;  Service: Gynecology;  Laterality: N/A;   DILATION AND CURETTAGE OF UTERUS   1997   x 2   FOREIGN BODY REMOVAL Left 11/18/2016   Procedure: REMOVAL FOREIGN BODY EXTREMITY LEFT FOOT;  Surgeon: Donnice JONELLE Fees, DPM;  Location: [facility] OR;  Service: Podiatry;  Laterality: Left;   GASTRIC ROUX-EN-Y N/A 04/13/2023   Procedure: LAPAROSCOPIC ROUX-EN-Y GASTRIC BYPASS WITH UPPER ENDOSCOPY;  Surgeon: Signe Mitzie LABOR, MD;  Location: [facility]S;  Service: General;  Laterality: N/A;   KNEE ARTHROSCOPY WITH MEDIAL MENISECTOMY Right 09/10/2021   Procedure: KNEE ARTHROSCOPY WITH MEDIAL MENISCAL ROOT REPAIR;  Surgeon: Cristy Bonner DASEN, MD;  Location: [facility];  Service: Orthopedics;  Laterality: Right;   PILONIDAL CYST EXCISION  1990's   RADIOLOGY WITH ANESTHESIA N/A 11/10/2018   Procedure: MRI WITH ANESTHESIA OF BRAIN AND ORBITS WITH AND WITHOUT CONTRAST;  Surgeon: Radiologist, Medication, MD;  Location: [facility] OR;  Service: Radiology;  Laterality: N/A;   SHOULDER ARTHROSCOPY WITH ROTATOR CUFF REPAIR Right 06/16/2024   Procedure: ARTHROSCOPY, SHOULDER, WITH ROTATOR CUFF REPAIR;  Surgeon: Sharl Selinda Dover, MD;  Location: [facility];  Service: Orthopedics;  Laterality: Right;   SUBACROMIAL DECOMPRESSION Right 06/16/2024   Procedure: DECOMPRESSION, SUBACROMIAL SPACE;  Surgeon: Sharl Selinda Dover, MD;  Location: [facility];  Service: Orthopedics;  Laterality: Right;  Right shoulder arthroscopy with rotator  cuff repair, biceps tenodesis, subacromial decompression, distal clavicle excision   TENDON REPAIR Left 2011   Left Ankle   TOTAL LAPAROSCOPIC HYSTERECTOMY WITH SALPINGECTOMY Bilateral 02/04/2021   Procedure: TOTAL LAPAROSCOPIC HYSTERECTOMY WITH SALPINGECTOMY;  Surgeon: Jannis Kate Norris, MD;  Location: [facility] OR;  Service: Gynecology;  Laterality: Bilateral;   UPPER GASTROINTESTINAL ENDOSCOPY     WISDOM TOOTH EXTRACTION  1990's   Patient Active Problem List   Diagnosis Date Noted   History of nocturnal hypoglycemia 03/02/2024   Encounter for  annual physical exam 03/02/2024   Family history of elevated lipoprotein (a) 04/26/2023   PVC's (premature ventricular contractions) 04/26/2023   Morbid obesity (HCC) 04/13/2023   Gastroesophageal reflux disease 12/24/2022   Abnormal laboratory test 12/24/2022   Chest wall pain 12/24/2022   Change in facial mole 12/29/2021   S/P laparoscopic hysterectomy 02/04/2021   PTSD (post-traumatic stress disorder) 07/21/2018   GAD (generalized anxiety disorder) 07/21/2018   Insomnia 07/21/2018   Recurrent major depressive disorder, in partial remission 07/21/2018   Insulin  resistance 10/21/2017   Hypertriglyceridemia 10/07/2017   Vitamin D  deficiency 10/07/2017   Pre-diabetes 11/02/2016   Morbid obesity with body mass index (BMI) of 50.0 to 59.9 in adult (HCC) 10/15/2016    PCP: Oris Credit NP  REFERRING PROVIDER: Sharl Mayo MD  REFERRING DIAG: s/p right shoulder scope with rotator cuff repair, biceps tenodesis and subacromial decompression  THERAPY DIAG: right shoulder pain; right shoulder stiffness; weakness   Rationale for Evaluation and Treatment: Rehabilitation  ONSET DATE: Spring 2025  SUBJECTIVE:                                                                                                                                                                                      SUBJECTIVE STATEMENT: I am trying to do more with my fingers, hands, etc.  I have more pain in the elbow and shoulder vs neck and scapular area.  Still can't sleep in the bed - I'm going to commit to the recliner for a while longer.  The massage helped my scapular area and neck.  I am still sore in Lt neck from the sling.  using the ice machine several times a day - my husband helps me set it up.  Next MD follow up mid Nov  Hand dominance: Right  Date of surgery 06/16/2024 s/p right shoulder scope with rotator cuff repair, biceps tenodesis and subacromial decompression Pt reports that MD advised her  that she could sleep with sling off with pillow support and that she may have her arm straight with support A/ROM to begin at 6 weeks  PERTINENT HISTORY: Right shoulder scope with rotator cuff  repair, biceps tenodesis, and subacromial decompression on 06/16/24 Knee scope 12/29/23 Can't take NSAIDS b/c of gastric bypass  PAIN:   Are you having pain? Yes NPRS scale: 1-2/10 Pain location: right elbow - achy comes and goes - located at lateral epicondyle, right shoulder achy Pain orientation: Right  PAIN TYPE: aching Pain description: constant  Aggravating factors: post surgical pain mostly well managed Relieving factors: medication   PRECAUTIONS: Shoulder surgical   WEIGHT BEARING RESTRICTIONS: No  FALLS:  Has patient fallen in last 6 months? 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  PLOF: Independent  PATIENT GOALS:baseline be 75% ; shopping, goes to [employer] stretching class in the water   NEXT MD VISIT:   OBJECTIVE:  Note: Objective measures were completed at Evaluation unless otherwise noted.  DIAGNOSTIC FINDINGS:  Full thickness tear of subscapularis and supraspinatus and biceps tear  PATIENT SURVEYS:  Quick Dash:  QUICK DASH  Please rate your ability do the following activities in the last week by selecting the number below the appropriate response.   Activities Rating  Open a tight or new jar.  5 = Unable  Do heavy household chores (e.g., wash walls, floors). 5 = Unable  Carry a shopping bag or briefcase 5 = Unable  Wash your back. 5 = Unable  Use a knife to cut food. 5 = Unable  Recreational activities in which you take some force or impact through your arm, shoulder or hand (e.g., golf, hammering, tennis, etc.). 5 = Unable  During the past week, to what extent has your arm, shoulder or hand problem interfered  with your normal social activities with family, friends, neighbors or groups?  4 = Quite a bit  During the past week, were you limited in your work or other regular daily activities as a result of your arm, shoulder or hand problem? 5 = Unable  Rate the severity of the following symptoms in the last week: Arm, Shoulder, or hand pain. 4 = Severe  Rate the severity of the following symptoms in the last week: Tingling (pins and needles) in your arm, shoulder or hand. 1 = none  During the past week, how much difficulty have you had sleeping because of the pain in your arm, shoulder or hand?  3 = Moderate difficulty   (A QuickDASH score may not be calculated if there is greater than 1 missing item.)  Quick Dash Disability/Symptom Score: 81.8%   Minimally Clinically Important Difference (MCID): 15-20 points  Flavio, F. et al. (2013). Minimally clinically important difference of the disabilities of the arm, shoulder, and hand outcome measures (DASH) and its shortened version (Quick DASH). Journal of Orthopaedic & Sports Physical Therapy, 44(1), 30-39)   COGNITION: Overall cognitive status: Within functional limits for tasks assessed     SENSATION: Denies numbness/tingling  APPEARANCE:  3 portal incisions with steri strips; no redness; no drainage; amount of edema consistent with 1 week post op    UPPER EXTREMITY ROM:  Passive ROM Right eval   Shoulder flexion 15   Shoulder extension    Shoulder abduction 10   Shoulder adduction    Shoulder extension    Shoulder internal rotation 45   Shoulder external rotation 0   Elbow flexion    Elbow extension    Wrist flexion    Wrist extension    Wrist ulnar deviation  Wrist radial deviation    Wrist pronation    Wrist supination     (Blank rows = not tested)   Active ROM Right eval Left eval  Shoulder flexion Not tested secondary to surgical precautions WFLS  Shoulder extension    Shoulder abduction  WFLS  Shoulder adduction     Shoulder internal rotation  WFLs  Shoulder external rotation  WFLs  Elbow flexion    Elbow extension    Wrist flexion    Wrist extension    Wrist ulnar deviation    Wrist radial deviation    Wrist pronation    Wrist supination    (Blank rows = not tested)  UPPER EXTREMITY MMT:  MMT Right eval Left eval  Shoulder flexion Not tested secondary to surgical precautions 5  Shoulder extension  5  Shoulder abduction  5  Shoulder adduction    Shoulder internal rotation  5  Shoulder external rotation  5  Middle trapezius    Lower trapezius    Elbow flexion  5  Elbow extension  5  Wrist flexion  5  Wrist extension  5  Wrist ulnar deviation    Wrist radial deviation    Wrist pronation    Wrist supination    Grip strength (lbs)    (Blank rows = not tested)                                                                                                                              TREATMENT DATE:  07/10/24 Right wrist flexion stretch for forearm 2x20 with elbow bent STM Rt forearm extensors - gentle strumming and light cross friction to midbelly and tendons Pt education: symptoms consistent with mild tendinitis of Rt forearm extensors - ice x10' or ice cup massage x2', self massage, stretching, caution with return to fine motor tasks to avoid overload Lt SL: Rt P/ROM shoulder flexion and scaption to 90 deg - painfree and no guarding today.  Scapular gentle AA/ROM 4-way with arm propped in scaption Supine: Rt shoulder P/ROM ER to neutral, Rt shoulder scaption to 90, passive elbow flexion and extension avoiding end range elbow ext, P/ROM wrist all directions  07/05/24: Chair massage to Rt upper trap, intrascpular region, TP release to levator scap, gentle strumming to Rt scalenes, SCM and pectorals Lt SL: Rt scapular mobilizations - retraction, circles to modulate pain and improve relaxation, reduce guarding Lt SL: P/ROM Rt UE scaption to 80 deg, ER to neutral away from sling  position Pt educ: every 2 hours of upright time take 30 min in recliner out of sling for postural unweighting, modifiy scap retractions to smaller range  07/03/2024: Seated cervical retraction 2x10 Seated cervical sidebend 2x10 Seated cervical rotation 2x10 Seated scapular retraction 2x10 Supine P/ROM to right shoulder in all planes of motion per surgical restrictions Seated grip strengthening with 5# digiflex 2x10 Seated wrist flexion/extension x10 Seated opening/closing hand x10 Seated cat/cow (with taking caution for shoulder  use and mainly using thoracic ROM) x10 Review of HEP   06/29/14: Supine propped on wedge: passive shoulder flexion 10 degrees, abduction 15 degrees, internal rotation 20 degrees 3 sets of 10 Seated scapular retraction 10x Review of cervical ROM and hand/finger ex's Standing pendulum 1st with other hand supporting the elbow, then arm dangling but not in full elbow extension gentle forward and back rocking in staggered stance 3 rounds of 5 Discussion on her husband assisting with gentle passive movements and where to support her arm  PATIENT EDUCATION: Education details: BERDINE, Educated patient on anatomy and physiology of current symptoms, prognosis, plan of care as well as initial self care strategies to promote recovery  Person educated: Patient Education method: Explanation Education comprehension: verbalized understanding  HOME EXERCISE PROGRAM: Access Code: [email or web address] URL: [email or web address] Date: 07/03/2024 Prepared by: Jarrell Menke  Exercises - Supine Cervical Retraction with Towel  - 2 x daily - 7 x weekly - 1 sets - 10 reps - 10 hold - Supine Cervical Rotation AROM on Pillow  - 2 x daily - 7 x weekly - 1 sets - 5 reps - 10 hold - Seated Scapular Retraction  - 2 x daily - 7 x weekly - 1 sets - 10 reps - Seated Cervical Rotation AROM  - 1 x daily - 7 x weekly - 2 sets - 10 reps - Seated Cervical Sidebending AROM  - 1 x daily -  7 x weekly - 2 sets - 10 reps - Seated Cervical Retraction  - 1 x daily - 7 x weekly - 2 sets - 10 reps - Seated Gripping Towel  - 1 x daily - 7 x weekly - 2 sets - 10 reps - Wrist Flexion Extension AROM - Palms Down  - 1 x daily - 7 x weekly - 2 sets - 10 reps - Closing and Opening Hand With Shoulder Sling  - 1 x daily - 7 x weekly - 2 sets - 10 reps - Seated Shoulder Pendulum Exercise  - 1 x daily - 7 x weekly - 2 sets - 10 reps  ASSESSMENT:  CLINICAL IMPRESSION: Janani has tried to sleep in bed with pillow propping but is more comfortable in recliner.  She presented today with signs/symptoms consistent with Rt elbow tendonitis having started to do more fine motor tasks.  PT advised ice, wrist stretch with bent elbow, and performed STM/cross friction massage.  Pt is having much less pain and guarding with P/ROM.  She is currently meeting ROM goals for this phase of post-op protocol.    Eval: Patient is a 48 y.o. female who was seen today for physical therapy evaluation and treatment 1 week s/p right shoulder scope with rotator cuff repair, biceps tenodesis and subacromial decompression.  The patient would benefit from skilled PT to address shoulder range of motion limitations while following surgical precautions. When appropriate will progress from passive ROM to active assisted to active ROM and progressive strengthening.  Her current impairments and pain affect ability to perform basic ADLs including personal hygiene, dressing and sleeping and she is unable to work at this time.   OBJECTIVE IMPAIRMENTS: decreased activity tolerance, decreased ROM, decreased strength, increased edema, impaired perceived functional ability, impaired UE functional use, and pain.   ACTIVITY LIMITATIONS: carrying, lifting, sleeping, bed mobility, bathing, toileting, dressing, reach over head, and hygiene/grooming  PARTICIPATION LIMITATIONS: meal prep, cleaning, laundry, driving, shopping, community activity, and  occupation  PERSONAL FACTORS: Time since onset of injury/illness/exacerbation are also affecting patient's  functional outcome.   REHAB POTENTIAL: Good  CLINICAL DECISION MAKING: Stable/uncomplicated  EVALUATION COMPLEXITY: Low   GOALS: Goals reviewed with patient? Yes  SHORT TERM GOALS: Target date: 08/04/2024     The patient will demonstrate knowledge of basic self care strategies and exercises to promote healing  Baseline: Goal status: Ongoing  2.  The patient will report a 40% improvement in pain levels with functional activities which are currently difficult including sleeping, dressing, bathing Baseline:  Goal status: INITIAL  3.  Passive elevation to 150 degrees needed for overhead reaching Baseline:  Goal status: INITIAL  4.  Quick DASH functional outcome score improved to 70% indicating improved function with less pain Baseline:  Goal status: INITIAL     LONG TERM GOALS: Target date: 09/15/2024    The patient will be independent in a safe self progression of a home exercise program to promote further recovery of function  Baseline:  Goal status: INITIAL  2.  The patient will report a 75% improvement in pain levels with functional activities which are currently difficult including sleeping, grooming/dressing, return to work Baseline:  Goal status: INITIAL  3.  The patient will have improved active shoulder elevation ROM to at least 130 degrees needed for grooming/dressing purposes as well as reaching high shelves  Baseline:  Goal status: INITIAL  4.  The patient will be have shoulder external and internal rotation to 40 degrees for dressing Baseline:  Goal status: INITIAL  5.  The patient will have grossly 4/5 strength needed to lift and lower a 2# object from a high shelf  Baseline:  Goal status: INITIAL  6.  Quick DASH functional outcome score improved to 60% indicating improved function with less pain Baseline:  Goal status: INITIAL  PLAN:  PT  FREQUENCY: 2x/week  PT DURATION: 12 weeks  PLANNED INTERVENTIONS: 97164- PT Re-evaluation, 97750- Physical Performance Testing, 97110-Therapeutic exercises, 97530- Therapeutic activity, 97112- Neuromuscular re-education, 97535- Self Care, [id number]- Manual therapy, [phone]- Aquatic Therapy, [id number]- Electrical stimulation (unattended), [phone]- Electrical stimulation (manual), 97016- Vasopneumatic device, Patient/Family education, Taping, Joint mobilization, Cryotherapy, and Moist heat  PLAN FOR NEXT SESSION: how is Rt forearm (extensor muscles and tendons), Passive shoulder ROM phase; pendulums; glenohumeral and scapular mobilization  Orvil Fester, PT 07/10/24 11:01 AM    [facility] [address] [place], [place] [address] Phone # [phone] Fax [phone]

## 2024-07-12 ENCOUNTER — Ambulatory Visit: Admitting: Physical Therapy

## 2024-07-12 ENCOUNTER — Encounter: Payer: Self-pay | Admitting: Physical Therapy

## 2024-07-12 DIAGNOSIS — M6281 Muscle weakness (generalized): Secondary | ICD-10-CM

## 2024-07-12 DIAGNOSIS — M25611 Stiffness of right shoulder, not elsewhere classified: Secondary | ICD-10-CM | POA: Diagnosis not present

## 2024-07-12 DIAGNOSIS — M25511 Pain in right shoulder: Secondary | ICD-10-CM | POA: Diagnosis not present

## 2024-07-12 DIAGNOSIS — R600 Localized edema: Secondary | ICD-10-CM | POA: Diagnosis not present

## 2024-07-12 NOTE — Therapy (Signed)
 OUTPATIENT PHYSICAL THERAPY SHOULDER TREATMENT   Patient Name: Caroline Gonzales MRN: [id number] DOB:01-Nov-1975, 48 y.o., female Today's Date: 07/12/2024  END OF SESSION:  PT End of Session - 07/12/24 1448     Visit Number 7    Date for Recertification  09/15/24    Authorization Type [employer]    PT Start Time 1446    PT Stop Time 1529    PT Time Calculation (min) 43 min    Activity Tolerance Patient limited by pain            Past Medical History:  Diagnosis Date   Acute costochondritis 11/14/2021   Allergy    Zyrtec .   Anxiety    followed by Dr. Melba   Back pain    Complication of anesthesia    PONV   Depression    Elevated blood pressure reading without diagnosis of hypertension 12/29/2021   Fibroid    Gastroparesis 09/14/2012   gastric emptying study in 2014   GERD (gastroesophageal reflux disease)    Headache    Hypertriglyceridemia 10/07/2017   Nightmares 07/21/2018   Pneumonia    2013ish   PONV (postoperative nausea and vomiting)     likes scopolamine  patch and zofran  /phenergan  helps   Pre-diabetes    PTSD (post-traumatic stress disorder)    Residual foreign body in soft tissue 11/12/2016   Screening for colon cancer 07/02/2022   Sleep apnea 07/01/2018   cpap optional, pt close to not use cpap   Vitamin D  deficiency    Wears glasses    Past Surgical History:  Procedure Laterality Date   ABDOMINAL HYSTERECTOMY  01/2021   ANKLE ARTHROSCOPY Left 2011   BICEPT TENODESIS Right 06/16/2024   Procedure: TENODESIS, BICEPS;  Surgeon: Sharl Selinda Dover, MD;  Location: [facility];  Service: Orthopedics;  Laterality: Right;   CESAREAN SECTION  06/2006   x 1   CHOLECYSTECTOMY  07/2006   laparoscopic   CYSTOSCOPY N/A 02/04/2021   Procedure: CYSTOSCOPY;  Surgeon: Jannis Kate Norris, MD;  Location: [facility] OR;  Service: Gynecology;  Laterality: N/A;   DILATION AND CURETTAGE OF UTERUS  1997   x 2   FOREIGN BODY REMOVAL Left 11/18/2016    Procedure: REMOVAL FOREIGN BODY EXTREMITY LEFT FOOT;  Surgeon: Donnice JONELLE Fees, DPM;  Location: [facility] OR;  Service: Podiatry;  Laterality: Left;   GASTRIC ROUX-EN-Y N/A 04/13/2023   Procedure: LAPAROSCOPIC ROUX-EN-Y GASTRIC BYPASS WITH UPPER ENDOSCOPY;  Surgeon: Signe Mitzie LABOR, MD;  Location: [facility];  Service: General;  Laterality: N/A;   KNEE ARTHROSCOPY WITH MEDIAL MENISECTOMY Right 09/10/2021   Procedure: KNEE ARTHROSCOPY WITH MEDIAL MENISCAL ROOT REPAIR;  Surgeon: Cristy Bonner DASEN, MD;  Location: [facility];  Service: Orthopedics;  Laterality: Right;   PILONIDAL CYST EXCISION  1990's   RADIOLOGY WITH ANESTHESIA N/A 11/10/2018   Procedure: MRI WITH ANESTHESIA OF BRAIN AND ORBITS WITH AND WITHOUT CONTRAST;  Surgeon: Radiologist, Medication, MD;  Location: [facility] OR;  Service: Radiology;  Laterality: N/A;   SHOULDER ARTHROSCOPY WITH ROTATOR CUFF REPAIR Right 06/16/2024   Procedure: ARTHROSCOPY, SHOULDER, WITH ROTATOR CUFF REPAIR;  Surgeon: Sharl Selinda Dover, MD;  Location: [facility];  Service: Orthopedics;  Laterality: Right;   SUBACROMIAL DECOMPRESSION Right 06/16/2024   Procedure: DECOMPRESSION, SUBACROMIAL SPACE;  Surgeon: Sharl Selinda Dover, MD;  Location: [facility];  Service: Orthopedics;  Laterality: Right;  Right shoulder arthroscopy with rotator cuff repair, biceps tenodesis, subacromial decompression, distal clavicle excision  TENDON REPAIR Left 2011   Left Ankle   TOTAL LAPAROSCOPIC HYSTERECTOMY WITH SALPINGECTOMY Bilateral 02/04/2021   Procedure: TOTAL LAPAROSCOPIC HYSTERECTOMY WITH SALPINGECTOMY;  Surgeon: Jannis Kate Norris, MD;  Location: [facility] OR;  Service: Gynecology;  Laterality: Bilateral;   UPPER GASTROINTESTINAL ENDOSCOPY     WISDOM TOOTH EXTRACTION  1990's   Patient Active Problem List   Diagnosis Date Noted   History of nocturnal hypoglycemia 03/02/2024   Encounter for annual physical exam 03/02/2024   Family history of  elevated lipoprotein (a) 04/26/2023   PVC's (premature ventricular contractions) 04/26/2023   Morbid obesity (HCC) 04/13/2023   Gastroesophageal reflux disease 12/24/2022   Abnormal laboratory test 12/24/2022   Chest wall pain 12/24/2022   Change in facial mole 12/29/2021   S/P laparoscopic hysterectomy 02/04/2021   PTSD (post-traumatic stress disorder) 07/21/2018   GAD (generalized anxiety disorder) 07/21/2018   Insomnia 07/21/2018   Recurrent major depressive disorder, in partial remission 07/21/2018   Insulin  resistance 10/21/2017   Hypertriglyceridemia 10/07/2017   Vitamin D  deficiency 10/07/2017   Pre-diabetes 11/02/2016   Morbid obesity with body mass index (BMI) of 50.0 to 59.9 in adult (HCC) 10/15/2016    PCP: Oris Credit NP  REFERRING PROVIDER: Sharl Mayo MD  REFERRING DIAG: s/p right shoulder scope with rotator cuff repair, biceps tenodesis and subacromial decompression  THERAPY DIAG: right shoulder pain; right shoulder stiffness; weakness   Rationale for Evaluation and Treatment: Rehabilitation  ONSET DATE: Spring 2025  SUBJECTIVE:                                                                                                                                                                                      SUBJECTIVE STATEMENT: Pain all right anterior shoulder.  Elbow less painful.  Had a pop in arm/elbow and now not painful.     using the ice machine several times a day - my husband helps me set it up.  Next MD follow up mid Nov  Hand dominance: Right  Date of surgery 06/16/2024 s/p right shoulder scope with rotator cuff repair, biceps tenodesis and subacromial decompression Pt reports that MD advised her that she could sleep with sling off with pillow support and that she may have her arm straight with support A/ROM to begin at 6 weeks  PERTINENT HISTORY: Right shoulder scope with rotator cuff repair, biceps tenodesis, and subacromial decompression  on 06/16/24 Knee scope 12/29/23 Can't take NSAIDS b/c of gastric bypass  PAIN:   Are you having pain? Yes NPRS scale: 1/10 Pain location: right elbow - achy comes and goes - located at lateral epicondyle, right shoulder achy Pain orientation: Right  PAIN TYPE: aching  Pain description: constant  Aggravating factors: post surgical pain mostly well managed Relieving factors: medication   PRECAUTIONS: Shoulder surgical   WEIGHT BEARING RESTRICTIONS: No  FALLS:  Has patient fallen in last 6 months? 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  PLOF: Independent  PATIENT GOALS:baseline be 75% ; shopping, goes to [employer] stretching class in the water   NEXT MD VISIT:   OBJECTIVE:  Note: Objective measures were completed at Evaluation unless otherwise noted.  DIAGNOSTIC FINDINGS:  Full thickness tear of subscapularis and supraspinatus and biceps tear  PATIENT SURVEYS:  Quick Dash:  QUICK DASH  Please rate your ability do the following activities in the last week by selecting the number below the appropriate response.   Activities Rating  Open a tight or new jar.  5 = Unable  Do heavy household chores (e.g., wash walls, floors). 5 = Unable  Carry a shopping bag or briefcase 5 = Unable  Wash your back. 5 = Unable  Use a knife to cut food. 5 = Unable  Recreational activities in which you take some force or impact through your arm, shoulder or hand (e.g., golf, hammering, tennis, etc.). 5 = Unable  During the past week, to what extent has your arm, shoulder or hand problem interfered with your normal social activities with family, friends, neighbors or groups?  4 = Quite a bit  During the past week, were you limited in your work or other regular daily activities as a result of your arm, shoulder or hand problem? 5 = Unable  Rate the  severity of the following symptoms in the last week: Arm, Shoulder, or hand pain. 4 = Severe  Rate the severity of the following symptoms in the last week: Tingling (pins and needles) in your arm, shoulder or hand. 1 = none  During the past week, how much difficulty have you had sleeping because of the pain in your arm, shoulder or hand?  3 = Moderate difficulty   (A QuickDASH score may not be calculated if there is greater than 1 missing item.)  Quick Dash Disability/Symptom Score: 81.8%   Minimally Clinically Important Difference (MCID): 15-20 points  Flavio, F. et al. (2013). Minimally clinically important difference of the disabilities of the arm, shoulder, and hand outcome measures (DASH) and its shortened version (Quick DASH). Journal of Orthopaedic & Sports Physical Therapy, 44(1), 30-39)   COGNITION: Overall cognitive status: Within functional limits for tasks assessed     SENSATION: Denies numbness/tingling  APPEARANCE:  3 portal incisions with steri strips; no redness; no drainage; amount of edema consistent with 1 week post op    UPPER EXTREMITY ROM:  Passive ROM Right eval   Shoulder flexion 15   Shoulder extension    Shoulder abduction 10   Shoulder adduction    Shoulder extension    Shoulder internal rotation 45   Shoulder external rotation 0   Elbow flexion    Elbow extension    Wrist flexion    Wrist extension    Wrist ulnar deviation    Wrist radial deviation    Wrist pronation    Wrist supination     (Blank rows = not tested)   Active ROM Right eval Left eval  Shoulder flexion Not tested secondary to surgical precautions WFLS  Shoulder extension    Shoulder abduction  WFLS  Shoulder adduction  Shoulder internal rotation  WFLs  Shoulder external rotation  WFLs  Elbow flexion    Elbow extension    Wrist flexion    Wrist extension    Wrist ulnar deviation    Wrist radial deviation    Wrist pronation    Wrist supination    (Blank rows  = not tested)  UPPER EXTREMITY MMT:  MMT Right eval Left eval  Shoulder flexion Not tested secondary to surgical precautions 5  Shoulder extension  5  Shoulder abduction  5  Shoulder adduction    Shoulder internal rotation  5  Shoulder external rotation  5  Middle trapezius    Lower trapezius    Elbow flexion  5  Elbow extension  5  Wrist flexion  5  Wrist extension  5  Wrist ulnar deviation    Wrist radial deviation    Wrist pronation    Wrist supination    Grip strength (lbs)    (Blank rows = not tested)                                                                                                                              TREATMENT DATE:  07/12/24 Pt education: husband assisting with shoulder passive ROM flexion to 90 degrees Review of HEP Cervical chin tucks with added upper cervical spine flexion  Discussion on expected progression to active assisted ROM in approx 2 more weeks Supine flat table with extra pillow/towel roll behind elbow: Rt shoulder P/ROM ER to neutral, Rt shoulder elevation to 90, right shoulder abduction 30 degrees, passive elbow flexion and extension avoiding end range elbow ext 3 rounds of 10 each Scope incisions appear very well healed Discussed her plan to do aquatic ex for her legs and other arm, plans to wear an old sling to support her right shoulder   07/10/24 Right wrist flexion stretch for forearm 2x20 with elbow bent STM Rt forearm extensors - gentle strumming and light cross friction to midbelly and tendons Pt education: symptoms consistent with mild tendinitis of Rt forearm extensors - ice x10' or ice cup massage x2', self massage, stretching, caution with return to fine motor tasks to avoid overload Lt SL: Rt P/ROM shoulder flexion and scaption to 90 deg - painfree and no guarding today.  Scapular gentle AA/ROM 4-way with arm propped in scaption Supine: Rt shoulder P/ROM ER to neutral, Rt shoulder scaption to 90, passive elbow  flexion and extension avoiding end range elbow ext, P/ROM wrist all directions  07/05/24: Chair massage to Rt upper trap, intrascpular region, TP release to levator scap, gentle strumming to Rt scalenes, SCM and pectorals Lt SL: Rt scapular mobilizations - retraction, circles to modulate pain and improve relaxation, reduce guarding Lt SL: P/ROM Rt UE scaption to 80 deg, ER to neutral away from sling position Pt educ: every 2 hours of upright time take 30 min in recliner out of sling for postural unweighting, modifiy scap retractions to  smaller range  07/03/2024: Seated cervical retraction 2x10 Seated cervical sidebend 2x10 Seated cervical rotation 2x10 Seated scapular retraction 2x10 Supine P/ROM to right shoulder in all planes of motion per surgical restrictions Seated grip strengthening with 5# digiflex 2x10 Seated wrist flexion/extension x10 Seated opening/closing hand x10 Seated cat/cow (with taking caution for shoulder use and mainly using thoracic ROM) x10 Review of HEP  PATIENT EDUCATION: Education details: [employer], Educated patient on anatomy and physiology of current symptoms, prognosis, plan of care as well as initial self care strategies to promote recovery  Person educated: Patient Education method: Explanation Education comprehension: verbalized understanding  HOME EXERCISE PROGRAM: Access Code: [email or web address] URL: [email or web address] Date: 07/03/2024 Prepared by: Jarrell Menke  Exercises - Supine Cervical Retraction with Towel  - 2 x daily - 7 x weekly - 1 sets - 10 reps - 10 hold - Supine Cervical Rotation AROM on Pillow  - 2 x daily - 7 x weekly - 1 sets - 5 reps - 10 hold - Seated Scapular Retraction  - 2 x daily - 7 x weekly - 1 sets - 10 reps - Seated Cervical Rotation AROM  - 1 x daily - 7 x weekly - 2 sets - 10 reps - Seated Cervical Sidebending AROM  - 1 x daily - 7 x weekly - 2 sets - 10 reps - Seated Cervical Retraction  - 1 x daily - 7 x  weekly - 2 sets - 10 reps - Seated Gripping Towel  - 1 x daily - 7 x weekly - 2 sets - 10 reps - Wrist Flexion Extension AROM - Palms Down  - 1 x daily - 7 x weekly - 2 sets - 10 reps - Closing and Opening Hand With Shoulder Sling  - 1 x daily - 7 x weekly - 2 sets - 10 reps - Seated Shoulder Pendulum Exercise  - 1 x daily - 7 x weekly - 2 sets - 10 reps  ASSESSMENT:  CLINICAL IMPRESSION: Pain level much improved this week and reduction in muscle guarding with passive ROM.  Able to tolerate elevation to 90 degrees with ease today.  She is compliant with following rotator cuff precautions and current HEP.  On track with expected recovery s/p surgery.    Eval: Patient is a 48 y.o. female who was seen today for physical therapy evaluation and treatment 1 week s/p right shoulder scope with rotator cuff repair, biceps tenodesis and subacromial decompression.  The patient would benefit from skilled PT to address shoulder range of motion limitations while following surgical precautions. When appropriate will progress from passive ROM to active assisted to active ROM and progressive strengthening.  Her current impairments and pain affect ability to perform basic ADLs including personal hygiene, dressing and sleeping and she is unable to work at this time.   OBJECTIVE IMPAIRMENTS: decreased activity tolerance, decreased ROM, decreased strength, increased edema, impaired perceived functional ability, impaired UE functional use, and pain.   ACTIVITY LIMITATIONS: carrying, lifting, sleeping, bed mobility, bathing, toileting, dressing, reach over head, and hygiene/grooming  PARTICIPATION LIMITATIONS: meal prep, cleaning, laundry, driving, shopping, community activity, and occupation  PERSONAL FACTORS: Time since onset of injury/illness/exacerbation are also affecting patient's functional outcome.   REHAB POTENTIAL: Good  CLINICAL DECISION MAKING: Stable/uncomplicated  EVALUATION COMPLEXITY:  Low   GOALS: Goals reviewed with patient? Yes  SHORT TERM GOALS: Target date: 08/04/2024     The patient will demonstrate knowledge of basic self care strategies and exercises to promote healing  Baseline:  Goal status: Ongoing  2.  The patient will report a 40% improvement in pain levels with functional activities which are currently difficult including sleeping, dressing, bathing Baseline:  Goal status: INITIAL  3.  Passive elevation to 150 degrees needed for overhead reaching Baseline:  Goal status: INITIAL  4.  Quick DASH functional outcome score improved to 70% indicating improved function with less pain Baseline:  Goal status: INITIAL     LONG TERM GOALS: Target date: 09/15/2024    The patient will be independent in a safe self progression of a home exercise program to promote further recovery of function  Baseline:  Goal status: INITIAL  2.  The patient will report a 75% improvement in pain levels with functional activities which are currently difficult including sleeping, grooming/dressing, return to work Baseline:  Goal status: INITIAL  3.  The patient will have improved active shoulder elevation ROM to at least 130 degrees needed for grooming/dressing purposes as well as reaching high shelves  Baseline:  Goal status: INITIAL  4.  The patient will be have shoulder external and internal rotation to 40 degrees for dressing Baseline:  Goal status: INITIAL  5.  The patient will have grossly 4/5 strength needed to lift and lower a 2# object from a high shelf  Baseline:  Goal status: INITIAL  6.  Quick DASH functional outcome score improved to 60% indicating improved function with less pain Baseline:  Goal status: INITIAL  PLAN:  PT FREQUENCY: 2x/week  PT DURATION: 12 weeks  PLANNED INTERVENTIONS: 97164- PT Re-evaluation, 97750- Physical Performance Testing, 97110-Therapeutic exercises, 97530- Therapeutic activity, 97112- Neuromuscular re-education, 97535-  Self Care, [id number]- Manual therapy, [id number]- Aquatic Therapy, [id number]- Electrical stimulation (unattended), [phone]- Electrical stimulation (manual), 97016- Vasopneumatic device, Patient/Family education, Taping, Joint mobilization, Cryotherapy, and Moist heat  PLAN FOR NEXT SESSION: Passive shoulder ROM phase; glenohumeral and scapular mobilization; cervical ROM, wrist and hand ROM    Glade Pesa, PT 07/12/24 4:56 PM Phone: [phone] Fax: [phone]   [facility] [address] [place], [place] [address] Phone # [phone] Fax [phone]

## 2024-07-13 ENCOUNTER — Other Ambulatory Visit (HOSPITAL_COMMUNITY): Payer: Self-pay

## 2024-07-13 ENCOUNTER — Other Ambulatory Visit: Payer: Self-pay

## 2024-07-13 ENCOUNTER — Encounter: Payer: Self-pay | Admitting: Nurse Practitioner

## 2024-07-15 ENCOUNTER — Other Ambulatory Visit (HOSPITAL_COMMUNITY): Payer: Self-pay

## 2024-07-17 ENCOUNTER — Encounter: Payer: Self-pay | Admitting: Physical Therapy

## 2024-07-17 ENCOUNTER — Ambulatory Visit: Attending: Orthopedic Surgery | Admitting: Physical Therapy

## 2024-07-17 DIAGNOSIS — M25511 Pain in right shoulder: Secondary | ICD-10-CM | POA: Insufficient documentation

## 2024-07-17 DIAGNOSIS — R600 Localized edema: Secondary | ICD-10-CM | POA: Diagnosis not present

## 2024-07-17 DIAGNOSIS — M6281 Muscle weakness (generalized): Secondary | ICD-10-CM | POA: Insufficient documentation

## 2024-07-17 DIAGNOSIS — M25611 Stiffness of right shoulder, not elsewhere classified: Secondary | ICD-10-CM | POA: Insufficient documentation

## 2024-07-17 NOTE — Therapy (Signed)
 OUTPATIENT PHYSICAL THERAPY SHOULDER TREATMENT   Patient Name: Caroline Gonzales MRN: [id number] DOB:1975/11/17, 48 y.o., female Today's Date: 07/17/2024  END OF SESSION:  PT End of Session - 07/17/24 1107     Visit Number 8    Date for Recertification  09/15/24    Authorization Type [employer]    PT Start Time 1102    PT Stop Time 1145    PT Time Calculation (min) 43 min    Activity Tolerance Patient limited by pain    Behavior During Therapy [facility] for tasks assessed/performed             Past Medical History:  Diagnosis Date   Acute costochondritis 11/14/2021   Allergy    Zyrtec .   Anxiety    followed by Dr. Melba   Back pain    Complication of anesthesia    PONV   Depression    Elevated blood pressure reading without diagnosis of hypertension 12/29/2021   Fibroid    Gastroparesis 09/14/2012   gastric emptying study in 2014   GERD (gastroesophageal reflux disease)    Headache    Hypertriglyceridemia 10/07/2017   Nightmares 07/21/2018   Pneumonia    2013ish   PONV (postoperative nausea and vomiting)     likes scopolamine  patch and zofran  /phenergan  helps   Pre-diabetes    PTSD (post-traumatic stress disorder)    Residual foreign body in soft tissue 11/12/2016   Screening for colon cancer 07/02/2022   Sleep apnea 07/01/2018   cpap optional, pt close to not use cpap   Vitamin D  deficiency    Wears glasses    Past Surgical History:  Procedure Laterality Date   ABDOMINAL HYSTERECTOMY  01/2021   ANKLE ARTHROSCOPY Left 2011   BICEPT TENODESIS Right 06/16/2024   Procedure: TENODESIS, BICEPS;  Surgeon: Sharl Selinda Dover, MD;  Location: [facility];  Service: Orthopedics;  Laterality: Right;   CESAREAN SECTION  06/2006   x 1   CHOLECYSTECTOMY  07/2006   laparoscopic   CYSTOSCOPY N/A 02/04/2021   Procedure: CYSTOSCOPY;  Surgeon: Jannis Kate Norris, MD;  Location: [facility] OR;  Service: Gynecology;  Laterality: N/A;   DILATION AND CURETTAGE OF UTERUS   1997   x 2   FOREIGN BODY REMOVAL Left 11/18/2016   Procedure: REMOVAL FOREIGN BODY EXTREMITY LEFT FOOT;  Surgeon: Donnice JONELLE Fees, DPM;  Location: [facility] OR;  Service: Podiatry;  Laterality: Left;   GASTRIC ROUX-EN-Y N/A 04/13/2023   Procedure: LAPAROSCOPIC ROUX-EN-Y GASTRIC BYPASS WITH UPPER ENDOSCOPY;  Surgeon: Signe Mitzie LABOR, MD;  Location: [facility];  Service: General;  Laterality: N/A;   KNEE ARTHROSCOPY WITH MEDIAL MENISECTOMY Right 09/10/2021   Procedure: KNEE ARTHROSCOPY WITH MEDIAL MENISCAL ROOT REPAIR;  Surgeon: Cristy Bonner DASEN, MD;  Location: [facility];  Service: Orthopedics;  Laterality: Right;   PILONIDAL CYST EXCISION  1990's   RADIOLOGY WITH ANESTHESIA N/A 11/10/2018   Procedure: MRI WITH ANESTHESIA OF BRAIN AND ORBITS WITH AND WITHOUT CONTRAST;  Surgeon: Radiologist, Medication, MD;  Location: [facility] OR;  Service: Radiology;  Laterality: N/A;   SHOULDER ARTHROSCOPY WITH ROTATOR CUFF REPAIR Right 06/16/2024   Procedure: ARTHROSCOPY, SHOULDER, WITH ROTATOR CUFF REPAIR;  Surgeon: Sharl Selinda Dover, MD;  Location: [facility];  Service: Orthopedics;  Laterality: Right;   SUBACROMIAL DECOMPRESSION Right 06/16/2024   Procedure: DECOMPRESSION, SUBACROMIAL SPACE;  Surgeon: Sharl Selinda Dover, MD;  Location: [facility];  Service: Orthopedics;  Laterality: Right;  Right shoulder arthroscopy with  rotator cuff repair, biceps tenodesis, subacromial decompression, distal clavicle excision   TENDON REPAIR Left 2011   Left Ankle   TOTAL LAPAROSCOPIC HYSTERECTOMY WITH SALPINGECTOMY Bilateral 02/04/2021   Procedure: TOTAL LAPAROSCOPIC HYSTERECTOMY WITH SALPINGECTOMY;  Surgeon: Jannis Kate Norris, MD;  Location: [facility] OR;  Service: Gynecology;  Laterality: Bilateral;   UPPER GASTROINTESTINAL ENDOSCOPY     WISDOM TOOTH EXTRACTION  1990's   Patient Active Problem List   Diagnosis Date Noted   History of nocturnal hypoglycemia 03/02/2024   Encounter for  annual physical exam 03/02/2024   Family history of elevated lipoprotein (a) 04/26/2023   PVC's (premature ventricular contractions) 04/26/2023   Morbid obesity (HCC) 04/13/2023   Gastroesophageal reflux disease 12/24/2022   Abnormal laboratory test 12/24/2022   Chest wall pain 12/24/2022   Change in facial mole 12/29/2021   S/P laparoscopic hysterectomy 02/04/2021   PTSD (post-traumatic stress disorder) 07/21/2018   GAD (generalized anxiety disorder) 07/21/2018   Insomnia 07/21/2018   Recurrent major depressive disorder, in partial remission 07/21/2018   Insulin  resistance 10/21/2017   Hypertriglyceridemia 10/07/2017   Vitamin D  deficiency 10/07/2017   Pre-diabetes 11/02/2016   Morbid obesity with body mass index (BMI) of 50.0 to 59.9 in adult [facility]) 10/15/2016    PCP: Oris Credit NP  REFERRING PROVIDER: Sharl Mayo MD  REFERRING DIAG: s/p right shoulder scope with rotator cuff repair, biceps tenodesis and subacromial decompression  THERAPY DIAG: right shoulder pain; right shoulder stiffness; weakness   Rationale for Evaluation and Treatment: Rehabilitation  ONSET DATE: Spring 2025  SUBJECTIVE:                                                                                                                                                                                      SUBJECTIVE STATEMENT: I tried the water  class with Rt arm in sling but it hurt so much my pain went up to 8-9/10 and brought me to tears.  I am back down to a 1/10 - anterior shoulder soreness. Elbow is much better.   using the ice machine several times a day - my husband helps me set it up.  Next MD follow up mid Nov  Hand dominance: Right  Date of surgery 06/16/2024 s/p right shoulder scope with rotator cuff repair, biceps tenodesis and subacromial decompression Pt reports that MD advised her that she could sleep with sling off with pillow support and that she may have her arm straight with  support A/ROM to begin at 6 weeks  PERTINENT HISTORY: Right shoulder scope with rotator cuff repair, biceps tenodesis, and subacromial decompression on 06/16/24 Knee scope 12/29/23 Can't take NSAIDS b/c of gastric bypass  PAIN:  Are you having pain? Yes NPRS scale: 1/10 Pain location: right elbow - achy comes and goes - located at lateral epicondyle, right shoulder achy Pain orientation: Right  PAIN TYPE: aching Pain description: constant  Aggravating factors: post surgical pain mostly well managed Relieving factors: medication   PRECAUTIONS: Shoulder surgical   WEIGHT BEARING RESTRICTIONS: No  FALLS:  Has patient fallen in last 6 months? 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  PLOF: Independent  PATIENT GOALS:baseline be 75% ; shopping, goes to [employer] stretching class in the water   NEXT MD VISIT:   OBJECTIVE:  Note: Objective measures were completed at Evaluation unless otherwise noted.  DIAGNOSTIC FINDINGS:  Full thickness tear of subscapularis and supraspinatus and biceps tear  PATIENT SURVEYS:  Quick Dash:  QUICK DASH  Please rate your ability do the following activities in the last week by selecting the number below the appropriate response.   Activities Rating  Open a tight or new jar.  5 = Unable  Do heavy household chores (e.g., wash walls, floors). 5 = Unable  Carry a shopping bag or briefcase 5 = Unable  Wash your back. 5 = Unable  Use a knife to cut food. 5 = Unable  Recreational activities in which you take some force or impact through your arm, shoulder or hand (e.g., golf, hammering, tennis, etc.). 5 = Unable  During the past week, to what extent has your arm, shoulder or hand problem interfered with your normal social activities with family, friends, neighbors or groups?  4 = Quite a bit  During  the past week, were you limited in your work or other regular daily activities as a result of your arm, shoulder or hand problem? 5 = Unable  Rate the severity of the following symptoms in the last week: Arm, Shoulder, or hand pain. 4 = Severe  Rate the severity of the following symptoms in the last week: Tingling (pins and needles) in your arm, shoulder or hand. 1 = none  During the past week, how much difficulty have you had sleeping because of the pain in your arm, shoulder or hand?  3 = Moderate difficulty   (A QuickDASH score may not be calculated if there is greater than 1 missing item.)  Quick Dash Disability/Symptom Score: 81.8%   Minimally Clinically Important Difference (MCID): 15-20 points  Flavio, F. et al. (2013). Minimally clinically important difference of the disabilities of the arm, shoulder, and hand outcome measures (DASH) and its shortened version (Quick DASH). Journal of Orthopaedic & Sports Physical Therapy, 44(1), 30-39)   COGNITION: Overall cognitive status: Within functional limits for tasks assessed     SENSATION: Denies numbness/tingling  APPEARANCE:  3 portal incisions with steri strips; no redness; no drainage; amount of edema consistent with 1 week post op    UPPER EXTREMITY ROM:  Passive ROM Right eval   Shoulder flexion 15   Shoulder extension    Shoulder abduction 10   Shoulder adduction    Shoulder extension    Shoulder internal rotation 45   Shoulder external rotation 0   Elbow flexion    Elbow extension    Wrist flexion    Wrist extension    Wrist ulnar deviation    Wrist radial deviation    Wrist pronation    Wrist supination     (Blank rows = not  tested)   Active ROM Right eval Left eval  Shoulder flexion Not tested secondary to surgical precautions WFLS  Shoulder extension    Shoulder abduction  WFLS  Shoulder adduction    Shoulder internal rotation  WFLs  Shoulder external rotation  WFLs  Elbow flexion    Elbow  extension    Wrist flexion    Wrist extension    Wrist ulnar deviation    Wrist radial deviation    Wrist pronation    Wrist supination    (Blank rows = not tested)  UPPER EXTREMITY MMT:  MMT Right eval Left eval  Shoulder flexion Not tested secondary to surgical precautions 5  Shoulder extension  5  Shoulder abduction  5  Shoulder adduction    Shoulder internal rotation  5  Shoulder external rotation  5  Middle trapezius    Lower trapezius    Elbow flexion  5  Elbow extension  5  Wrist flexion  5  Wrist extension  5  Wrist ulnar deviation    Wrist radial deviation    Wrist pronation    Wrist supination    Grip strength (lbs)    (Blank rows = not tested)                                                                                                                              TREATMENT DATE:  07/17/24 Discussion about how water  class went despite trying to protect Rt shoulder with sling in water  and using pool noodle between arm and trunk - too much movement and resistance and increased pain Supine gentle circular massage across Rt bicep and bicep tendon Supine P/ROM: ER to neutral, Rt shoulder elevation to 90, right shoulder abduction 30 degrees, passive elbow flexion and extension avoiding end range elbow ext 3 rounds of 10 each Supine wrist circles x10 each way Supine gentle resisted isometrics shoulder ext into pillow prop and elbow ext with elbow at 90 deg into PT hand 5x5 each Lt SL: Rt STM intrascapular stripping, levator scapula TP release, gentle ribcage mobs for approximation along Rt ribcage for pain relief from sling position  07/12/24 Pt education: husband assisting with shoulder passive ROM flexion to 90 degrees Review of HEP Cervical chin tucks with added upper cervical spine flexion  Discussion on expected progression to active assisted ROM in approx 2 more weeks Supine flat table with extra pillow/towel roll behind elbow: Rt shoulder P/ROM ER to  neutral, Rt shoulder elevation to 90, right shoulder abduction 30 degrees, passive elbow flexion and extension avoiding end range elbow ext 3 rounds of 10 each Scope incisions appear very well healed Discussed her plan to do aquatic ex for her legs and other arm, plans to wear an old sling to support her right shoulder   07/10/24 Right wrist flexion stretch for forearm 2x20 with elbow bent STM Rt forearm extensors - gentle strumming and light cross friction to midbelly and tendons Pt  education: symptoms consistent with mild tendinitis of Rt forearm extensors - ice x10' or ice cup massage x2', self massage, stretching, caution with return to fine motor tasks to avoid overload Lt SL: Rt P/ROM shoulder flexion and scaption to 90 deg - painfree and no guarding today.  Scapular gentle AA/ROM 4-way with arm propped in scaption Supine: Rt shoulder P/ROM ER to neutral, Rt shoulder scaption to 90, passive elbow flexion and extension avoiding end range elbow ext, P/ROM wrist all directions    PATIENT EDUCATION: Education details: BERDINE, Educated patient on anatomy and physiology of current symptoms, prognosis, plan of care as well as initial self care strategies to promote recovery  Person educated: Patient Education method: Explanation Education comprehension: verbalized understanding  HOME EXERCISE PROGRAM: Access Code: [email or web address] URL: [email or web address] Date: 07/03/2024 Prepared by: Jarrell Menke  Exercises - Supine Cervical Retraction with Towel  - 2 x daily - 7 x weekly - 1 sets - 10 reps - 10 hold - Supine Cervical Rotation AROM on Pillow  - 2 x daily - 7 x weekly - 1 sets - 5 reps - 10 hold - Seated Scapular Retraction  - 2 x daily - 7 x weekly - 1 sets - 10 reps - Seated Cervical Rotation AROM  - 1 x daily - 7 x weekly - 2 sets - 10 reps - Seated Cervical Sidebending AROM  - 1 x daily - 7 x weekly - 2 sets - 10 reps - Seated Cervical Retraction  - 1 x daily - 7 x  weekly - 2 sets - 10 reps - Seated Gripping Towel  - 1 x daily - 7 x weekly - 2 sets - 10 reps - Wrist Flexion Extension AROM - Palms Down  - 1 x daily - 7 x weekly - 2 sets - 10 reps - Closing and Opening Hand With Shoulder Sling  - 1 x daily - 7 x weekly - 2 sets - 10 reps - Seated Shoulder Pendulum Exercise  - 1 x daily - 7 x weekly - 2 sets - 10 reps  ASSESSMENT:  CLINICAL IMPRESSION: Pt reported signif increase in shoulder pain with attempt to return to water  class with mesh sling and pool noodle as abduction pillow.  Pain reached 8-9/10 in anterior shoulder afterwards but has now recovered to 1/10.  All pain is located in anterior shoulder along stiff bicep tendon, and secondary postural pain in Rt intrascapular region likely from static posture in sling.  PT performed gentle STM along bicep tendon and heavier manual techniques in intrascapular region, both of which were relieving.  Pt is on track with P/ROM range for this phase of post-op status. She sees MD next week.   Eval: Patient is a 48 y.o. female who was seen today for physical therapy evaluation and treatment 1 week s/p right shoulder scope with rotator cuff repair, biceps tenodesis and subacromial decompression.  The patient would benefit from skilled PT to address shoulder range of motion limitations while following surgical precautions. When appropriate will progress from passive ROM to active assisted to active ROM and progressive strengthening.  Her current impairments and pain affect ability to perform basic ADLs including personal hygiene, dressing and sleeping and she is unable to work at this time.   OBJECTIVE IMPAIRMENTS: decreased activity tolerance, decreased ROM, decreased strength, increased edema, impaired perceived functional ability, impaired UE functional use, and pain.   ACTIVITY LIMITATIONS: carrying, lifting, sleeping, bed mobility, bathing, toileting, dressing, reach over head, and  hygiene/grooming  PARTICIPATION LIMITATIONS: meal prep, cleaning, laundry, driving, shopping, community activity, and occupation  PERSONAL FACTORS: Time since onset of injury/illness/exacerbation are also affecting patient's functional outcome.   REHAB POTENTIAL: Good  CLINICAL DECISION MAKING: Stable/uncomplicated  EVALUATION COMPLEXITY: Low   GOALS: Goals reviewed with patient? Yes  SHORT TERM GOALS: Target date: 08/04/2024     The patient will demonstrate knowledge of basic self care strategies and exercises to promote healing  Baseline: Goal status: Ongoing  2.  The patient will report a 40% improvement in pain levels with functional activities which are currently difficult including sleeping, dressing, bathing Baseline:  Goal status: ONGOING, SLEEPING IN RECLINER 11/3  3.  Passive elevation to 150 degrees needed for overhead reaching Baseline:  Goal status: ONGOING 11/3, P/ROM to 90 with ease and no pain  4.  Quick DASH functional outcome score improved to 70% indicating improved function with less pain Baseline:  Goal status: INITIAL     LONG TERM GOALS: Target date: 09/15/2024    The patient will be independent in a safe self progression of a home exercise program to promote further recovery of function  Baseline:  Goal status: INITIAL  2.  The patient will report a 75% improvement in pain levels with functional activities which are currently difficult including sleeping, grooming/dressing, return to work Baseline:  Goal status: INITIAL  3.  The patient will have improved active shoulder elevation ROM to at least 130 degrees needed for grooming/dressing purposes as well as reaching high shelves  Baseline:  Goal status: INITIAL  4.  The patient will be have shoulder external and internal rotation to 40 degrees for dressing Baseline:  Goal status: INITIAL  5.  The patient will have grossly 4/5 strength needed to lift and lower a 2# object from a high shelf   Baseline:  Goal status: INITIAL  6.  Quick DASH functional outcome score improved to 60% indicating improved function with less pain Baseline:  Goal status: INITIAL  PLAN:  PT FREQUENCY: 2x/week  PT DURATION: 12 weeks  PLANNED INTERVENTIONS: 97164- PT Re-evaluation, 97750- Physical Performance Testing, 97110-Therapeutic exercises, 97530- Therapeutic activity, 97112- Neuromuscular re-education, 97535- Self Care, [id number]- Manual therapy, [id number]- Aquatic Therapy, [id number]- Electrical stimulation (unattended), [phone]- Electrical stimulation (manual), 97016- Vasopneumatic device, Patient/Family education, Taping, Joint mobilization, Cryotherapy, and Moist heat  PLAN FOR NEXT SESSION: Passive shoulder ROM phase; glenohumeral and scapular mobilization; cervical ROM, wrist and hand ROM   Esvin Hnat, PT 07/17/24 11:56 AM  Phone: [phone] Fax: [phone]   [facility] [address], [address] [place], [place] [address] Phone # [phone] Fax [phone]

## 2024-07-18 DIAGNOSIS — F4310 Post-traumatic stress disorder, unspecified: Secondary | ICD-10-CM | POA: Diagnosis not present

## 2024-07-19 ENCOUNTER — Ambulatory Visit

## 2024-07-19 DIAGNOSIS — F4310 Post-traumatic stress disorder, unspecified: Secondary | ICD-10-CM | POA: Diagnosis not present

## 2024-07-24 ENCOUNTER — Ambulatory Visit: Admitting: Physical Therapy

## 2024-07-24 ENCOUNTER — Encounter: Payer: Self-pay | Admitting: Physical Therapy

## 2024-07-24 DIAGNOSIS — M6281 Muscle weakness (generalized): Secondary | ICD-10-CM

## 2024-07-24 DIAGNOSIS — M25611 Stiffness of right shoulder, not elsewhere classified: Secondary | ICD-10-CM | POA: Diagnosis not present

## 2024-07-24 DIAGNOSIS — R600 Localized edema: Secondary | ICD-10-CM | POA: Diagnosis not present

## 2024-07-24 DIAGNOSIS — M25511 Pain in right shoulder: Secondary | ICD-10-CM | POA: Diagnosis not present

## 2024-07-24 NOTE — Therapy (Signed)
 OUTPATIENT PHYSICAL THERAPY SHOULDER TREATMENT   Patient Name: BRIANN SARCHET MRN: [id number] DOB:1976/04/06, 48 y.o., female Today's Date: 07/24/2024  END OF SESSION:  PT End of Session - 07/24/24 0851     Visit Number 9    Date for Recertification  09/15/24    Authorization Type [employer]    PT Start Time [phone]    PT Stop Time 0930    PT Time Calculation (min) 40 min    Activity Tolerance Patient limited by pain    Behavior During Therapy [facility] for tasks assessed/performed              Past Medical History:  Diagnosis Date   Acute costochondritis 11/14/2021   Allergy    Zyrtec .   Anxiety    followed by Dr. Melba   Back pain    Complication of anesthesia    PONV   Depression    Elevated blood pressure reading without diagnosis of hypertension 12/29/2021   Fibroid    Gastroparesis 09/14/2012   gastric emptying study in 2014   GERD (gastroesophageal reflux disease)    Headache    Hypertriglyceridemia 10/07/2017   Nightmares 07/21/2018   Pneumonia    2013ish   PONV (postoperative nausea and vomiting)     likes scopolamine  patch and zofran  /phenergan  helps   Pre-diabetes    PTSD (post-traumatic stress disorder)    Residual foreign body in soft tissue 11/12/2016   Screening for colon cancer 07/02/2022   Sleep apnea 07/01/2018   cpap optional, pt close to not use cpap   Vitamin D  deficiency    Wears glasses    Past Surgical History:  Procedure Laterality Date   ABDOMINAL HYSTERECTOMY  01/2021   ANKLE ARTHROSCOPY Left 2011   BICEPT TENODESIS Right 06/16/2024   Procedure: TENODESIS, BICEPS;  Surgeon: Sharl Selinda Dover, MD;  Location: [facility];  Service: Orthopedics;  Laterality: Right;   CESAREAN SECTION  06/2006   x 1   CHOLECYSTECTOMY  07/2006   laparoscopic   CYSTOSCOPY N/A 02/04/2021   Procedure: CYSTOSCOPY;  Surgeon: Jannis Kate Norris, MD;  Location: [facility] OR;  Service: Gynecology;  Laterality: N/A;   DILATION AND CURETTAGE OF  UTERUS  1997   x 2   FOREIGN BODY REMOVAL Left 11/18/2016   Procedure: REMOVAL FOREIGN BODY EXTREMITY LEFT FOOT;  Surgeon: Donnice JONELLE Fees, DPM;  Location: [facility] OR;  Service: Podiatry;  Laterality: Left;   GASTRIC ROUX-EN-Y N/A 04/13/2023   Procedure: LAPAROSCOPIC ROUX-EN-Y GASTRIC BYPASS WITH UPPER ENDOSCOPY;  Surgeon: Signe Mitzie LABOR, MD;  Location: [facility];  Service: General;  Laterality: N/A;   KNEE ARTHROSCOPY WITH MEDIAL MENISECTOMY Right 09/10/2021   Procedure: KNEE ARTHROSCOPY WITH MEDIAL MENISCAL ROOT REPAIR;  Surgeon: Cristy Bonner DASEN, MD;  Location: [facility];  Service: Orthopedics;  Laterality: Right;   PILONIDAL CYST EXCISION  1990's   RADIOLOGY WITH ANESTHESIA N/A 11/10/2018   Procedure: MRI WITH ANESTHESIA OF BRAIN AND ORBITS WITH AND WITHOUT CONTRAST;  Surgeon: Radiologist, Medication, MD;  Location: [facility] OR;  Service: Radiology;  Laterality: N/A;   SHOULDER ARTHROSCOPY WITH ROTATOR CUFF REPAIR Right 06/16/2024   Procedure: ARTHROSCOPY, SHOULDER, WITH ROTATOR CUFF REPAIR;  Surgeon: Sharl Selinda Dover, MD;  Location: [facility];  Service: Orthopedics;  Laterality: Right;   SUBACROMIAL DECOMPRESSION Right 06/16/2024   Procedure: DECOMPRESSION, SUBACROMIAL SPACE;  Surgeon: Sharl Selinda Dover, MD;  Location: [facility];  Service: Orthopedics;  Laterality: Right;  Right shoulder arthroscopy  with rotator cuff repair, biceps tenodesis, subacromial decompression, distal clavicle excision   TENDON REPAIR Left 2011   Left Ankle   TOTAL LAPAROSCOPIC HYSTERECTOMY WITH SALPINGECTOMY Bilateral 02/04/2021   Procedure: TOTAL LAPAROSCOPIC HYSTERECTOMY WITH SALPINGECTOMY;  Surgeon: Jannis Kate Norris, MD;  Location: [facility] OR;  Service: Gynecology;  Laterality: Bilateral;   UPPER GASTROINTESTINAL ENDOSCOPY     WISDOM TOOTH EXTRACTION  1990's   Patient Active Problem List   Diagnosis Date Noted   History of nocturnal hypoglycemia 03/02/2024    Encounter for annual physical exam 03/02/2024   Family history of elevated lipoprotein (a) 04/26/2023   PVC's (premature ventricular contractions) 04/26/2023   Morbid obesity (HCC) 04/13/2023   Gastroesophageal reflux disease 12/24/2022   Abnormal laboratory test 12/24/2022   Chest wall pain 12/24/2022   Change in facial mole 12/29/2021   S/P laparoscopic hysterectomy 02/04/2021   PTSD (post-traumatic stress disorder) 07/21/2018   GAD (generalized anxiety disorder) 07/21/2018   Insomnia 07/21/2018   Recurrent major depressive disorder, in partial remission 07/21/2018   Insulin  resistance 10/21/2017   Hypertriglyceridemia 10/07/2017   Vitamin D  deficiency 10/07/2017   Pre-diabetes 11/02/2016   Morbid obesity with body mass index (BMI) of 50.0 to 59.9 in adult [facility]) 10/15/2016    PCP: Oris Credit NP  REFERRING PROVIDER: Sharl Mayo MD  REFERRING DIAG: s/p right shoulder scope with rotator cuff repair, biceps tenodesis and subacromial decompression  THERAPY DIAG: right shoulder pain; right shoulder stiffness; weakness   Rationale for Evaluation and Treatment: Rehabilitation  ONSET DATE: Spring 2025  SUBJECTIVE:                                                                                                                                                                                      SUBJECTIVE STATEMENT: I have cut back on my pain meds in anticipation of seeing the doctor on Thurs.  I still have lateral shoulder pain and very tight around my neck.   using the ice machine several times a day - my husband helps me set it up.  Next MD follow up mid Nov  Hand dominance: Right  Date of surgery 06/16/2024 s/p right shoulder scope with rotator cuff repair, biceps tenodesis and subacromial decompression Pt reports that MD advised her that she could sleep with sling off with pillow support and that she may have her arm straight with support A/ROM to begin at 6  weeks  PERTINENT HISTORY: Right shoulder scope with rotator cuff repair, biceps tenodesis, and subacromial decompression on 06/16/24 Knee scope 12/29/23 Can't take NSAIDS b/c of gastric bypass  PAIN:   Are you having pain? Yes NPRS scale: 1/10 Pain location: right  elbow - achy comes and goes - located at lateral epicondyle, right shoulder achy Pain orientation: Right  PAIN TYPE: aching Pain description: constant  Aggravating factors: post surgical pain mostly well managed Relieving factors: medication   PRECAUTIONS: Shoulder surgical   WEIGHT BEARING RESTRICTIONS: No  FALLS:  Has patient fallen in last 6 months? 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  PLOF: Independent  PATIENT GOALS:baseline be 75% ; shopping, goes to [employer] stretching class in the water   NEXT MD VISIT:   OBJECTIVE:  Note: Objective measures were completed at Evaluation unless otherwise noted.  DIAGNOSTIC FINDINGS:  Full thickness tear of subscapularis and supraspinatus and biceps tear  PATIENT SURVEYS:  Quick Dash:  QUICK DASH  Please rate your ability do the following activities in the last week by selecting the number below the appropriate response.   Activities Rating  Open a tight or new jar.  5 = Unable  Do heavy household chores (e.g., wash walls, floors). 5 = Unable  Carry a shopping bag or briefcase 5 = Unable  Wash your back. 5 = Unable  Use a knife to cut food. 5 = Unable  Recreational activities in which you take some force or impact through your arm, shoulder or hand (e.g., golf, hammering, tennis, etc.). 5 = Unable  During the past week, to what extent has your arm, shoulder or hand problem interfered with your normal social activities with family, friends, neighbors or groups?  4 = Quite a bit  During the past week, were you  limited in your work or other regular daily activities as a result of your arm, shoulder or hand problem? 5 = Unable  Rate the severity of the following symptoms in the last week: Arm, Shoulder, or hand pain. 4 = Severe  Rate the severity of the following symptoms in the last week: Tingling (pins and needles) in your arm, shoulder or hand. 1 = none  During the past week, how much difficulty have you had sleeping because of the pain in your arm, shoulder or hand?  3 = Moderate difficulty   (A QuickDASH score may not be calculated if there is greater than 1 missing item.)  Quick Dash Disability/Symptom Score: 81.8%   Minimally Clinically Important Difference (MCID): 15-20 points  Flavio, F. et al. (2013). Minimally clinically important difference of the disabilities of the arm, shoulder, and hand outcome measures (DASH) and its shortened version (Quick DASH). Journal of Orthopaedic & Sports Physical Therapy, 44(1), 30-39)   COGNITION: Overall cognitive status: Within functional limits for tasks assessed     SENSATION: Denies numbness/tingling  APPEARANCE:  3 portal incisions with steri strips; no redness; no drainage; amount of edema consistent with 1 week post op    UPPER EXTREMITY ROM:  Passive ROM Right eval   Shoulder flexion 15   Shoulder extension    Shoulder abduction 10   Shoulder adduction    Shoulder extension    Shoulder internal rotation 45   Shoulder external rotation 0   Elbow flexion    Elbow extension    Wrist flexion    Wrist extension    Wrist ulnar deviation    Wrist radial deviation    Wrist pronation    Wrist supination     (Blank rows = not tested)   Active ROM Right eval Left eval  Shoulder  flexion Not tested secondary to surgical precautions WFLS  Shoulder extension    Shoulder abduction  WFLS  Shoulder adduction    Shoulder internal rotation  WFLs  Shoulder external rotation  WFLs  Elbow flexion    Elbow extension    Wrist flexion     Wrist extension    Wrist ulnar deviation    Wrist radial deviation    Wrist pronation    Wrist supination    (Blank rows = not tested)  UPPER EXTREMITY MMT:  MMT Right eval Left eval  Shoulder flexion Not tested secondary to surgical precautions 5  Shoulder extension  5  Shoulder abduction  5  Shoulder adduction    Shoulder internal rotation  5  Shoulder external rotation  5  Middle trapezius    Lower trapezius    Elbow flexion  5  Elbow extension  5  Wrist flexion  5  Wrist extension  5  Wrist ulnar deviation    Wrist radial deviation    Wrist pronation    Wrist supination    Grip strength (lbs)    (Blank rows = not tested)                                                                                                                              TREATMENT DATE:  07/24/24 Seated chair STM intrascap bil, cervical paraspinals bil, levator scap TP release on Rt  Seated Rt levator stretch with OP 3x20 Seated guided AA/ROM with OP lateral ribcage translation, lateral translation with contralateral rotation with OP, add costal expansion breathing Supine P/ROM: ER to neutral, Rt shoulder elevation to 90, right shoulder abduction 30 degrees, passive elbow flexion and extension avoiding end range elbow ext 3 rounds of 10 each PT P/ROM to 90 and 100 deg Rt shoulder with Pt cue to isometrically hold in place x3-5 sec x10 reps, P/ROM arm back to side  07/17/24 Discussion about how water  class went despite trying to protect Rt shoulder with sling in water  and using pool noodle between arm and trunk - too much movement and resistance and increased pain Supine gentle circular massage across Rt bicep and bicep tendon Supine P/ROM: ER to neutral, Rt shoulder elevation to 90, right shoulder abduction 30 degrees, passive elbow flexion and extension avoiding end range elbow ext 3 rounds of 10 each Supine wrist circles x10 each way Supine gentle resisted isometrics shoulder ext into  pillow prop and elbow ext with elbow at 90 deg into PT hand 5x5 each Lt SL: Rt STM intrascapular stripping, levator scapula TP release, gentle ribcage mobs for approximation along Rt ribcage for pain relief from sling position  07/12/24 Pt education: husband assisting with shoulder passive ROM flexion to 90 degrees Review of HEP Cervical chin tucks with added upper cervical spine flexion  Discussion on expected progression to active assisted ROM in approx 2 more weeks Supine flat table with extra pillow/towel roll behind elbow: Rt shoulder  P/ROM ER to neutral, Rt shoulder elevation to 90, right shoulder abduction 30 degrees, passive elbow flexion and extension avoiding end range elbow ext 3 rounds of 10 each Scope incisions appear very well healed Discussed her plan to do aquatic ex for her legs and other arm, plans to wear an old sling to support her right shoulder   PATIENT EDUCATION: Education details: ENGINEER, BUILDING SERVICES, Educated patient on anatomy and physiology of current symptoms, prognosis, plan of care as well as initial self care strategies to promote recovery  Person educated: Patient Education method: Explanation Education comprehension: verbalized understanding  HOME EXERCISE PROGRAM: Access Code: [email or web address] URL: [email or web address] Date: 07/03/2024 Prepared by: Jarrell Menke  Exercises - Supine Cervical Retraction with Towel  - 2 x daily - 7 x weekly - 1 sets - 10 reps - 10 hold - Supine Cervical Rotation AROM on Pillow  - 2 x daily - 7 x weekly - 1 sets - 5 reps - 10 hold - Seated Scapular Retraction  - 2 x daily - 7 x weekly - 1 sets - 10 reps - Seated Cervical Rotation AROM  - 1 x daily - 7 x weekly - 2 sets - 10 reps - Seated Cervical Sidebending AROM  - 1 x daily - 7 x weekly - 2 sets - 10 reps - Seated Cervical Retraction  - 1 x daily - 7 x weekly - 2 sets - 10 reps - Seated Gripping Towel  - 1 x daily - 7 x weekly - 2 sets - 10 reps - Wrist Flexion Extension  AROM - Palms Down  - 1 x daily - 7 x weekly - 2 sets - 10 reps - Closing and Opening Hand With Shoulder Sling  - 1 x daily - 7 x weekly - 2 sets - 10 reps - Seated Shoulder Pendulum Exercise  - 1 x daily - 7 x weekly - 2 sets - 10 reps  ASSESSMENT:  CLINICAL IMPRESSION: Pt very stiff throughout t-spine so incorporated seated guided ROM today which felt great to Pt.  She continues to have low grade ache in Rt shoulder with referral down to deltoid insertion.  She has tightness and TP in Rt intrascapular and neck region so STM and stretching applied today with great relief.  Pt is on track with P/ROM progress along protocol.  She sees MD on Thurs this week and will likely get cleared for AA/ROM phase.   Eval: Patient is a 48 y.o. female who was seen today for physical therapy evaluation and treatment 1 week s/p right shoulder scope with rotator cuff repair, biceps tenodesis and subacromial decompression.  The patient would benefit from skilled PT to address shoulder range of motion limitations while following surgical precautions. When appropriate will progress from passive ROM to active assisted to active ROM and progressive strengthening.  Her current impairments and pain affect ability to perform basic ADLs including personal hygiene, dressing and sleeping and she is unable to work at this time.   OBJECTIVE IMPAIRMENTS: decreased activity tolerance, decreased ROM, decreased strength, increased edema, impaired perceived functional ability, impaired UE functional use, and pain.   ACTIVITY LIMITATIONS: carrying, lifting, sleeping, bed mobility, bathing, toileting, dressing, reach over head, and hygiene/grooming  PARTICIPATION LIMITATIONS: meal prep, cleaning, laundry, driving, shopping, community activity, and occupation  PERSONAL FACTORS: Time since onset of injury/illness/exacerbation are also affecting patient's functional outcome.   REHAB POTENTIAL: Good  CLINICAL DECISION MAKING:  Stable/uncomplicated  EVALUATION COMPLEXITY: Low   GOALS: Goals reviewed with patient?  Yes  SHORT TERM GOALS: Target date: 08/04/2024     The patient will demonstrate knowledge of basic self care strategies and exercises to promote healing  Baseline: Goal status: Ongoing  2.  The patient will report a 40% improvement in pain levels with functional activities which are currently difficult including sleeping, dressing, bathing Baseline:  Goal status: ONGOING, SLEEPING IN RECLINER 11/3  3.  Passive elevation to 150 degrees needed for overhead reaching Baseline:  Goal status: ONGOING 11/3, P/ROM to 90 with ease and no pain  4.  Quick DASH functional outcome score improved to 70% indicating improved function with less pain Baseline:  Goal status: INITIAL     LONG TERM GOALS: Target date: 09/15/2024    The patient will be independent in a safe self progression of a home exercise program to promote further recovery of function  Baseline:  Goal status: INITIAL  2.  The patient will report a 75% improvement in pain levels with functional activities which are currently difficult including sleeping, grooming/dressing, return to work Baseline:  Goal status: INITIAL  3.  The patient will have improved active shoulder elevation ROM to at least 130 degrees needed for grooming/dressing purposes as well as reaching high shelves  Baseline:  Goal status: INITIAL  4.  The patient will be have shoulder external and internal rotation to 40 degrees for dressing Baseline:  Goal status: INITIAL  5.  The patient will have grossly 4/5 strength needed to lift and lower a 2# object from a high shelf  Baseline:  Goal status: INITIAL  6.  Quick DASH functional outcome score improved to 60% indicating improved function with less pain Baseline:  Goal status: INITIAL  PLAN:  PT FREQUENCY: 2x/week  PT DURATION: 12 weeks  PLANNED INTERVENTIONS: 97164- PT Re-evaluation, 97750- Physical  Performance Testing, 97110-Therapeutic exercises, 97530- Therapeutic activity, 97112- Neuromuscular re-education, 97535- Self Care, [id number]- Manual therapy, [id number]- Aquatic Therapy, [id number]- Electrical stimulation (unattended), [phone]- Electrical stimulation (manual), 97016- Vasopneumatic device, Patient/Family education, Taping, Joint mobilization, Cryotherapy, and Moist heat  PLAN FOR NEXT SESSION: Passive shoulder ROM phase; glenohumeral and scapular mobilization; cervical ROM, wrist and hand ROM   Juergen Hardenbrook, PT 07/24/24 11:44 AM   Phone: [phone] Fax: [phone]   [facility] [address], [address] [place], [place] [address] Phone # [phone] Fax [phone]

## 2024-07-26 ENCOUNTER — Encounter: Payer: Self-pay | Admitting: Physical Therapy

## 2024-07-26 ENCOUNTER — Ambulatory Visit: Admitting: Physical Therapy

## 2024-07-26 DIAGNOSIS — M25611 Stiffness of right shoulder, not elsewhere classified: Secondary | ICD-10-CM | POA: Diagnosis not present

## 2024-07-26 DIAGNOSIS — M25511 Pain in right shoulder: Secondary | ICD-10-CM

## 2024-07-26 DIAGNOSIS — R600 Localized edema: Secondary | ICD-10-CM | POA: Diagnosis not present

## 2024-07-26 DIAGNOSIS — M6281 Muscle weakness (generalized): Secondary | ICD-10-CM | POA: Diagnosis not present

## 2024-07-26 NOTE — Therapy (Signed)
 OUTPATIENT PHYSICAL THERAPY SHOULDER TREATMENT   Patient Name: JANAYAH ZAVADA MRN: [id number] DOB:September 04, 1976, 48 y.o., female Today's Date: 07/26/2024  END OF SESSION:  PT End of Session - 07/26/24 1528     Visit Number 10    Date for Recertification  09/15/24    Authorization Type [employer]    PT Start Time 1530    PT Stop Time 1613    PT Time Calculation (min) 43 min    Activity Tolerance Patient limited by pain              Past Medical History:  Diagnosis Date   Acute costochondritis 11/14/2021   Allergy    Zyrtec .   Anxiety    followed by Dr. Melba   Back pain    Complication of anesthesia    PONV   Depression    Elevated blood pressure reading without diagnosis of hypertension 12/29/2021   Fibroid    Gastroparesis 09/14/2012   gastric emptying study in 2014   GERD (gastroesophageal reflux disease)    Headache    Hypertriglyceridemia 10/07/2017   Nightmares 07/21/2018   Pneumonia    2013ish   PONV (postoperative nausea and vomiting)     likes scopolamine  patch and zofran  /phenergan  helps   Pre-diabetes    PTSD (post-traumatic stress disorder)    Residual foreign body in soft tissue 11/12/2016   Screening for colon cancer 07/02/2022   Sleep apnea 07/01/2018   cpap optional, pt close to not use cpap   Vitamin D  deficiency    Wears glasses    Past Surgical History:  Procedure Laterality Date   ABDOMINAL HYSTERECTOMY  01/2021   ANKLE ARTHROSCOPY Left 2011   BICEPT TENODESIS Right 06/16/2024   Procedure: TENODESIS, BICEPS;  Surgeon: Sharl Selinda Dover, MD;  Location: [facility];  Service: Orthopedics;  Laterality: Right;   CESAREAN SECTION  06/2006   x 1   CHOLECYSTECTOMY  07/2006   laparoscopic   CYSTOSCOPY N/A 02/04/2021   Procedure: CYSTOSCOPY;  Surgeon: Jannis Kate Norris, MD;  Location: [facility] OR;  Service: Gynecology;  Laterality: N/A;   DILATION AND CURETTAGE OF UTERUS  1997   x 2   FOREIGN BODY REMOVAL Left 11/18/2016    Procedure: REMOVAL FOREIGN BODY EXTREMITY LEFT FOOT;  Surgeon: Donnice JONELLE Fees, DPM;  Location: [facility] OR;  Service: Podiatry;  Laterality: Left;   GASTRIC ROUX-EN-Y N/A 04/13/2023   Procedure: LAPAROSCOPIC ROUX-EN-Y GASTRIC BYPASS WITH UPPER ENDOSCOPY;  Surgeon: Signe Mitzie LABOR, MD;  Location: [facility];  Service: General;  Laterality: N/A;   KNEE ARTHROSCOPY WITH MEDIAL MENISECTOMY Right 09/10/2021   Procedure: KNEE ARTHROSCOPY WITH MEDIAL MENISCAL ROOT REPAIR;  Surgeon: Cristy Bonner DASEN, MD;  Location: [facility];  Service: Orthopedics;  Laterality: Right;   PILONIDAL CYST EXCISION  1990's   RADIOLOGY WITH ANESTHESIA N/A 11/10/2018   Procedure: MRI WITH ANESTHESIA OF BRAIN AND ORBITS WITH AND WITHOUT CONTRAST;  Surgeon: Radiologist, Medication, MD;  Location: [facility] OR;  Service: Radiology;  Laterality: N/A;   SHOULDER ARTHROSCOPY WITH ROTATOR CUFF REPAIR Right 06/16/2024   Procedure: ARTHROSCOPY, SHOULDER, WITH ROTATOR CUFF REPAIR;  Surgeon: Sharl Selinda Dover, MD;  Location: [facility];  Service: Orthopedics;  Laterality: Right;   SUBACROMIAL DECOMPRESSION Right 06/16/2024   Procedure: DECOMPRESSION, SUBACROMIAL SPACE;  Surgeon: Sharl Selinda Dover, MD;  Location:  SURGERY CENTER;  Service: Orthopedics;  Laterality: Right;  Right shoulder arthroscopy with rotator cuff repair, biceps tenodesis, subacromial decompression, distal clavicle  excision   TENDON REPAIR Left 2011   Left Ankle   TOTAL LAPAROSCOPIC HYSTERECTOMY WITH SALPINGECTOMY Bilateral 02/04/2021   Procedure: TOTAL LAPAROSCOPIC HYSTERECTOMY WITH SALPINGECTOMY;  Surgeon: Jannis Kate Norris, MD;  Location: [facility] OR;  Service: Gynecology;  Laterality: Bilateral;   UPPER GASTROINTESTINAL ENDOSCOPY     WISDOM TOOTH EXTRACTION  1990's   Patient Active Problem List   Diagnosis Date Noted   History of nocturnal hypoglycemia 03/02/2024   Encounter for annual physical exam 03/02/2024   Family history of  elevated lipoprotein (a) 04/26/2023   PVC's (premature ventricular contractions) 04/26/2023   Morbid obesity (HCC) 04/13/2023   Gastroesophageal reflux disease 12/24/2022   Abnormal laboratory test 12/24/2022   Chest wall pain 12/24/2022   Change in facial mole 12/29/2021   S/P laparoscopic hysterectomy 02/04/2021   PTSD (post-traumatic stress disorder) 07/21/2018   GAD (generalized anxiety disorder) 07/21/2018   Insomnia 07/21/2018   Recurrent major depressive disorder, in partial remission 07/21/2018   Insulin  resistance 10/21/2017   Hypertriglyceridemia 10/07/2017   Vitamin D  deficiency 10/07/2017   Pre-diabetes 11/02/2016   Morbid obesity with body mass index (BMI) of 50.0 to 59.9 in adult (HCC) 10/15/2016    PCP: Oris Credit NP  REFERRING PROVIDER: Sharl Mayo MD  REFERRING DIAG: s/p right shoulder scope with rotator cuff repair, biceps tenodesis and subacromial decompression  THERAPY DIAG: right shoulder pain; right shoulder stiffness; weakness   Rationale for Evaluation and Treatment: Rehabilitation  ONSET DATE: Spring 2025  SUBJECTIVE:                                                                                                                                                                                      SUBJECTIVE STATEMENT: Tried to sleep in the bed but it was a no -go.  Had to go back to the recliner.  Trying to decrease medicine usage. Felt great after PT.     using the ice machine several times a day - my husband helps me set it up.  Next MD follow up mid Nov  Hand dominance: Right  Date of surgery 06/16/2024 s/p right shoulder scope with rotator cuff repair, biceps tenodesis and subacromial decompression Pt reports that MD advised her that she could sleep with sling off with pillow support and that she may have her arm straight with support A/ROM to begin at 6 weeks  PERTINENT HISTORY: Right shoulder scope with rotator cuff repair, biceps  tenodesis, and subacromial decompression on 06/16/24 Knee scope 12/29/23 Can't take NSAIDS b/c of gastric bypass  PAIN:   Are you having pain? Yes NPRS scale: 3/10 anterior/lateral shoulder  Pain location: right elbow - achy comes  and goes - located at lateral epicondyle, right shoulder achy Pain orientation: Right  PAIN TYPE: aching Pain description: constant  Aggravating factors: post surgical pain mostly well managed Relieving factors: medication   PRECAUTIONS: Shoulder surgical   WEIGHT BEARING RESTRICTIONS: No  FALLS:  Has patient fallen in last 6 months? 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  PLOF: Independent  PATIENT GOALS:baseline be 75% ; shopping, goes to [employer] stretching class in the water   NEXT MD VISIT:   OBJECTIVE:  Note: Objective measures were completed at Evaluation unless otherwise noted.  DIAGNOSTIC FINDINGS:  Full thickness tear of subscapularis and supraspinatus and biceps tear  PATIENT SURVEYS:  Quick Dash:  QUICK DASH  Please rate your ability do the following activities in the last week by selecting the number below the appropriate response.   Activities Rating  Open a tight or new jar.  5 = Unable  Do heavy household chores (e.g., wash walls, floors). 5 = Unable  Carry a shopping bag or briefcase 5 = Unable  Wash your back. 5 = Unable  Use a knife to cut food. 5 = Unable  Recreational activities in which you take some force or impact through your arm, shoulder or hand (e.g., golf, hammering, tennis, etc.). 5 = Unable  During the past week, to what extent has your arm, shoulder or hand problem interfered with your normal social activities with family, friends, neighbors or groups?  4 = Quite a bit  During the past week, were you limited in your work or other regular daily activities as a  result of your arm, shoulder or hand problem? 5 = Unable  Rate the severity of the following symptoms in the last week: Arm, Shoulder, or hand pain. 4 = Severe  Rate the severity of the following symptoms in the last week: Tingling (pins and needles) in your arm, shoulder or hand. 1 = none  During the past week, how much difficulty have you had sleeping because of the pain in your arm, shoulder or hand?  3 = Moderate difficulty   (A QuickDASH score may not be calculated if there is greater than 1 missing item.)  Quick Dash Disability/Symptom Score: 81.8%   Minimally Clinically Important Difference (MCID): 15-20 points  Flavio, F. et al. (2013). Minimally clinically important difference of the disabilities of the arm, shoulder, and hand outcome measures (DASH) and its shortened version (Quick DASH). Journal of Orthopaedic & Sports Physical Therapy, 44(1), 30-39)   COGNITION: Overall cognitive status: Within functional limits for tasks assessed     SENSATION: Denies numbness/tingling  APPEARANCE:  3 portal incisions with steri strips; no redness; no drainage; amount of edema consistent with 1 week post op    UPPER EXTREMITY ROM:  Passive ROM Right eval   Shoulder flexion 15   Shoulder extension    Shoulder abduction 10   Shoulder adduction    Shoulder extension    Shoulder internal rotation 45   Shoulder external rotation 0   Elbow flexion    Elbow extension    Wrist flexion    Wrist extension    Wrist ulnar deviation    Wrist radial deviation    Wrist pronation    Wrist supination     (Blank rows = not tested)   Active ROM Right eval Left eval  Shoulder flexion Not tested secondary  to surgical precautions WFLS  Shoulder extension    Shoulder abduction  WFLS  Shoulder adduction    Shoulder internal rotation  WFLs  Shoulder external rotation  WFLs  Elbow flexion    Elbow extension    Wrist flexion    Wrist extension    Wrist ulnar deviation    Wrist radial  deviation    Wrist pronation    Wrist supination    (Blank rows = not tested)  UPPER EXTREMITY MMT:  MMT Right eval Left eval  Shoulder flexion Not tested secondary to surgical precautions 5  Shoulder extension  5  Shoulder abduction  5  Shoulder adduction    Shoulder internal rotation  5  Shoulder external rotation  5  Middle trapezius    Lower trapezius    Elbow flexion  5  Elbow extension  5  Wrist flexion  5  Wrist extension  5  Wrist ulnar deviation    Wrist radial deviation    Wrist pronation    Wrist supination    Grip strength (lbs)    (Blank rows = not tested)                                                                                                                              TREATMENT DATE:  07/26/24 Theracane to levator scap to rhomboids pt plans to purchase one for home use as this worked well for trigger point release Discussed expected progression to active assisted ROM next week Seated chair STM  levator scap TP release on Rt  with levator scap stretch concurrently Seated Rt levator stretch with OP x20 Supine P/ROM: ER to neutral, Rt shoulder elevation to 90, right shoulder abduction 30 degrees, passive elbow flexion and extension avoiding end range elbow ext 3 rounds of 10 each Sidelying scapular mobility medial /lateral and superior/inferior    07/24/24 Seated chair STM intrascap bil, cervical paraspinals bil, levator scap TP release on Rt  Seated Rt levator stretch with OP 3x20 Seated guided AA/ROM with OP lateral ribcage translation, lateral translation with contralateral rotation with OP, add costal expansion breathing Supine P/ROM: ER to neutral, Rt shoulder elevation to 90, right shoulder abduction 30 degrees, passive elbow flexion and extension avoiding end range elbow ext 3 rounds of 10 each PT P/ROM to 90 and 100 deg Rt shoulder with Pt cue to isometrically hold in place x3-5 sec x10 reps, P/ROM arm back to side  07/17/24 Discussion  about how water  class went despite trying to protect Rt shoulder with sling in water  and using pool noodle between arm and trunk - too much movement and resistance and increased pain Supine gentle circular massage across Rt bicep and bicep tendon Supine P/ROM: ER to neutral, Rt shoulder elevation to 90, right shoulder abduction 30 degrees, passive elbow flexion and extension avoiding end range elbow ext 3 rounds of 10 each Supine wrist circles x10 each way Supine gentle resisted isometrics shoulder ext  into pillow prop and elbow ext with elbow at 90 deg into PT hand 5x5 each Lt SL: Rt STM intrascapular stripping, levator scapula TP release, gentle ribcage mobs for approximation along Rt ribcage for pain relief from sling position  07/12/24 Pt education: husband assisting with shoulder passive ROM flexion to 90 degrees Review of HEP Cervical chin tucks with added upper cervical spine flexion  Discussion on expected progression to active assisted ROM in approx 2 more weeks Supine flat table with extra pillow/towel roll behind elbow: Rt shoulder P/ROM ER to neutral, Rt shoulder elevation to 90, right shoulder abduction 30 degrees, passive elbow flexion and extension avoiding end range elbow ext 3 rounds of 10 each Scope incisions appear very well healed Discussed her plan to do aquatic ex for her legs and other arm, plans to wear an old sling to support her right shoulder   PATIENT EDUCATION: Education details: BERDINE, Educated patient on anatomy and physiology of current symptoms, prognosis, plan of care as well as initial self care strategies to promote recovery  Person educated: Patient Education method: Explanation Education comprehension: verbalized understanding  HOME EXERCISE PROGRAM: Access Code: [email or web address] URL: [email or web address] Date: 07/03/2024 Prepared by: Jarrell Menke  Exercises - Supine Cervical Retraction with Towel  - 2 x daily - 7 x weekly - 1 sets - 10  reps - 10 hold - Supine Cervical Rotation AROM on Pillow  - 2 x daily - 7 x weekly - 1 sets - 5 reps - 10 hold - Seated Scapular Retraction  - 2 x daily - 7 x weekly - 1 sets - 10 reps - Seated Cervical Rotation AROM  - 1 x daily - 7 x weekly - 2 sets - 10 reps - Seated Cervical Sidebending AROM  - 1 x daily - 7 x weekly - 2 sets - 10 reps - Seated Cervical Retraction  - 1 x daily - 7 x weekly - 2 sets - 10 reps - Seated Gripping Towel  - 1 x daily - 7 x weekly - 2 sets - 10 reps - Wrist Flexion Extension AROM - Palms Down  - 1 x daily - 7 x weekly - 2 sets - 10 reps - Closing and Opening Hand With Shoulder Sling  - 1 x daily - 7 x weekly - 2 sets - 10 reps - Seated Shoulder Pendulum Exercise  - 1 x daily - 7 x weekly - 2 sets - 10 reps  ASSESSMENT:  CLINICAL IMPRESSION: Verlisa is progressing well s/p rotator cuff repair with biceps tenodesis.  She has some myofascial tender points in levator scap and rhomboid muscles which respond well to manual release but also with the Theracane.  Passive ROM is less painful and within expected parameters this length of time from surgery.  Therapist ensuring surgical precautions are followed during this protective phase.  She will follow up with her orthopedist tomorrow and will begin active assisted ROM following physician clearance.      Eval: Patient is a 49 y.o. female who was seen today for physical therapy evaluation and treatment 1 week s/p right shoulder scope with rotator cuff repair, biceps tenodesis and subacromial decompression.  The patient would benefit from skilled PT to address shoulder range of motion limitations while following surgical precautions. When appropriate will progress from passive ROM to active assisted to active ROM and progressive strengthening.  Her current impairments and pain affect ability to perform basic ADLs including personal hygiene, dressing and sleeping and she is unable  to work at this time.   OBJECTIVE  IMPAIRMENTS: decreased activity tolerance, decreased ROM, decreased strength, increased edema, impaired perceived functional ability, impaired UE functional use, and pain.   ACTIVITY LIMITATIONS: carrying, lifting, sleeping, bed mobility, bathing, toileting, dressing, reach over head, and hygiene/grooming  PARTICIPATION LIMITATIONS: meal prep, cleaning, laundry, driving, shopping, community activity, and occupation  PERSONAL FACTORS: Time since onset of injury/illness/exacerbation are also affecting patient's functional outcome.   REHAB POTENTIAL: Good  CLINICAL DECISION MAKING: Stable/uncomplicated  EVALUATION COMPLEXITY: Low   GOALS: Goals reviewed with patient? Yes  SHORT TERM GOALS: Target date: 08/04/2024     The patient will demonstrate knowledge of basic self care strategies and exercises to promote healing  Baseline: Goal status: Ongoing  2.  The patient will report a 40% improvement in pain levels with functional activities which are currently difficult including sleeping, dressing, bathing Baseline:  Goal status: ONGOING, SLEEPING IN RECLINER 11/3  3.  Passive elevation to 150 degrees needed for overhead reaching Baseline:  Goal status: ONGOING 11/3, P/ROM to 90 with ease and no pain  4.  Quick DASH functional outcome score improved to 70% indicating improved function with less pain Baseline:  Goal status: INITIAL     LONG TERM GOALS: Target date: 09/15/2024    The patient will be independent in a safe self progression of a home exercise program to promote further recovery of function  Baseline:  Goal status: INITIAL  2.  The patient will report a 75% improvement in pain levels with functional activities which are currently difficult including sleeping, grooming/dressing, return to work Baseline:  Goal status: INITIAL  3.  The patient will have improved active shoulder elevation ROM to at least 130 degrees needed for grooming/dressing purposes as well as  reaching high shelves  Baseline:  Goal status: INITIAL  4.  The patient will be have shoulder external and internal rotation to 40 degrees for dressing Baseline:  Goal status: INITIAL  5.  The patient will have grossly 4/5 strength needed to lift and lower a 2# object from a high shelf  Baseline:  Goal status: INITIAL  6.  Quick DASH functional outcome score improved to 60% indicating improved function with less pain Baseline:  Goal status: INITIAL  PLAN:  PT FREQUENCY: 2x/week  PT DURATION: 12 weeks  PLANNED INTERVENTIONS: 97164- PT Re-evaluation, 97750- Physical Performance Testing, 97110-Therapeutic exercises, 97530- Therapeutic activity, 97112- Neuromuscular re-education, 97535- Self Care, [id number]- Manual therapy, [id number]- Aquatic Therapy, [id number]- Electrical stimulation (unattended), [phone]- Electrical stimulation (manual), 97016- Vasopneumatic device, Patient/Family education, Taping, Joint mobilization, Cryotherapy, and Moist heat  PLAN FOR NEXT SESSION: see how ortho appt went and for potential progression to active assisted ROM; Passive shoulder ROM; glenohumeral and scapular mobilization; cervical ROM, wrist and hand ROM    Glade Pesa, PT 07/26/24 4:25 PM Phone: [phone] Fax: [phone]  [facility] [address], [address] [place], [place] [address] Phone # [phone] Fax [phone]

## 2024-07-27 ENCOUNTER — Other Ambulatory Visit (HOSPITAL_COMMUNITY): Payer: Self-pay

## 2024-07-27 MED ORDER — HYDROCODONE-ACETAMINOPHEN 5-325 MG PO TABS
1.0000 | ORAL_TABLET | ORAL | 0 refills | Status: AC | PRN
  Filled 2024-07-27: qty 30, 5d supply, fill #0

## 2024-07-28 ENCOUNTER — Other Ambulatory Visit (HOSPITAL_COMMUNITY): Payer: Self-pay

## 2024-07-31 ENCOUNTER — Ambulatory Visit: Admitting: Physical Therapy

## 2024-07-31 ENCOUNTER — Encounter: Payer: Self-pay | Admitting: Physical Therapy

## 2024-07-31 DIAGNOSIS — M6281 Muscle weakness (generalized): Secondary | ICD-10-CM | POA: Diagnosis not present

## 2024-07-31 DIAGNOSIS — M25611 Stiffness of right shoulder, not elsewhere classified: Secondary | ICD-10-CM

## 2024-07-31 DIAGNOSIS — M25511 Pain in right shoulder: Secondary | ICD-10-CM | POA: Diagnosis not present

## 2024-07-31 DIAGNOSIS — R600 Localized edema: Secondary | ICD-10-CM | POA: Diagnosis not present

## 2024-07-31 NOTE — Therapy (Signed)
 OUTPATIENT PHYSICAL THERAPY SHOULDER TREATMENT   Patient Name: Caroline Gonzales MRN: [id number] DOB:1975-12-22, 48 y.o., female Today's Date: 07/31/2024  END OF SESSION:  PT End of Session - 07/31/24 1617     Visit Number 11    Date for Recertification  09/15/24    Authorization Type [employer]    PT Start Time 1616    PT Stop Time 1652    PT Time Calculation (min) 36 min    Activity Tolerance Patient limited by pain    Behavior During Therapy [facility] for tasks assessed/performed              Past Medical History:  Diagnosis Date   Acute costochondritis 11/14/2021   Allergy    Zyrtec .   Anxiety    followed by Dr. Melba   Back pain    Complication of anesthesia    PONV   Depression    Elevated blood pressure reading without diagnosis of hypertension 12/29/2021   Fibroid    Gastroparesis 09/14/2012   gastric emptying study in 2014   GERD (gastroesophageal reflux disease)    Headache    Hypertriglyceridemia 10/07/2017   Nightmares 07/21/2018   Pneumonia    2013ish   PONV (postoperative nausea and vomiting)     likes scopolamine  patch and zofran  /phenergan  helps   Pre-diabetes    PTSD (post-traumatic stress disorder)    Residual foreign body in soft tissue 11/12/2016   Screening for colon cancer 07/02/2022   Sleep apnea 07/01/2018   cpap optional, pt close to not use cpap   Vitamin D  deficiency    Wears glasses    Past Surgical History:  Procedure Laterality Date   ABDOMINAL HYSTERECTOMY  01/2021   ANKLE ARTHROSCOPY Left 2011   BICEPT TENODESIS Right 06/16/2024   Procedure: TENODESIS, BICEPS;  Surgeon: Sharl Selinda Dover, MD;  Location: [facility];  Service: Orthopedics;  Laterality: Right;   CESAREAN SECTION  06/2006   x 1   CHOLECYSTECTOMY  07/2006   laparoscopic   CYSTOSCOPY N/A 02/04/2021   Procedure: CYSTOSCOPY;  Surgeon: Jannis Kate Norris, MD;  Location: [facility] OR;  Service: Gynecology;  Laterality: N/A;   DILATION AND CURETTAGE OF  UTERUS  1997   x 2   FOREIGN BODY REMOVAL Left 11/18/2016   Procedure: REMOVAL FOREIGN BODY EXTREMITY LEFT FOOT;  Surgeon: Donnice JONELLE Fees, DPM;  Location: [facility] OR;  Service: Podiatry;  Laterality: Left;   GASTRIC ROUX-EN-Y N/A 04/13/2023   Procedure: LAPAROSCOPIC ROUX-EN-Y GASTRIC BYPASS WITH UPPER ENDOSCOPY;  Surgeon: Signe Mitzie LABOR, MD;  Location: [facility];  Service: General;  Laterality: N/A;   KNEE ARTHROSCOPY WITH MEDIAL MENISECTOMY Right 09/10/2021   Procedure: KNEE ARTHROSCOPY WITH MEDIAL MENISCAL ROOT REPAIR;  Surgeon: Cristy Bonner DASEN, MD;  Location: [facility];  Service: Orthopedics;  Laterality: Right;   PILONIDAL CYST EXCISION  1990's   RADIOLOGY WITH ANESTHESIA N/A 11/10/2018   Procedure: MRI WITH ANESTHESIA OF BRAIN AND ORBITS WITH AND WITHOUT CONTRAST;  Surgeon: Radiologist, Medication, MD;  Location: [facility] OR;  Service: Radiology;  Laterality: N/A;   SHOULDER ARTHROSCOPY WITH ROTATOR CUFF REPAIR Right 06/16/2024   Procedure: ARTHROSCOPY, SHOULDER, WITH ROTATOR CUFF REPAIR;  Surgeon: Sharl Selinda Dover, MD;  Location: [facility];  Service: Orthopedics;  Laterality: Right;   SUBACROMIAL DECOMPRESSION Right 06/16/2024   Procedure: DECOMPRESSION, SUBACROMIAL SPACE;  Surgeon: Sharl Selinda Dover, MD;  Location: [facility];  Service: Orthopedics;  Laterality: Right;  Right shoulder arthroscopy  with rotator cuff repair, biceps tenodesis, subacromial decompression, distal clavicle excision   TENDON REPAIR Left 2011   Left Ankle   TOTAL LAPAROSCOPIC HYSTERECTOMY WITH SALPINGECTOMY Bilateral 02/04/2021   Procedure: TOTAL LAPAROSCOPIC HYSTERECTOMY WITH SALPINGECTOMY;  Surgeon: Jannis Kate Norris, MD;  Location: [facility] OR;  Service: Gynecology;  Laterality: Bilateral;   UPPER GASTROINTESTINAL ENDOSCOPY     WISDOM TOOTH EXTRACTION  1990's   Patient Active Problem List   Diagnosis Date Noted   History of nocturnal hypoglycemia 03/02/2024    Encounter for annual physical exam 03/02/2024   Family history of elevated lipoprotein (a) 04/26/2023   PVC's (premature ventricular contractions) 04/26/2023   Morbid obesity (HCC) 04/13/2023   Gastroesophageal reflux disease 12/24/2022   Abnormal laboratory test 12/24/2022   Chest wall pain 12/24/2022   Change in facial mole 12/29/2021   S/P laparoscopic hysterectomy 02/04/2021   PTSD (post-traumatic stress disorder) 07/21/2018   GAD (generalized anxiety disorder) 07/21/2018   Insomnia 07/21/2018   Recurrent major depressive disorder, in partial remission 07/21/2018   Insulin  resistance 10/21/2017   Hypertriglyceridemia 10/07/2017   Vitamin D  deficiency 10/07/2017   Pre-diabetes 11/02/2016   Morbid obesity with body mass index (BMI) of 50.0 to 59.9 in adult (HCC) 10/15/2016    PCP: Oris Credit NP  REFERRING PROVIDER: Sharl Mayo MD  REFERRING DIAG: s/p right shoulder scope with rotator cuff repair, biceps tenodesis and subacromial decompression  THERAPY DIAG: right shoulder pain; right shoulder stiffness; weakness   Rationale for Evaluation and Treatment: Rehabilitation  ONSET DATE: Spring 2025  SUBJECTIVE:                                                                                                                                                                                      SUBJECTIVE STATEMENT: MD wants me completely out of brace by Thanksgiving.  We removed the abduction pillow. I have started to spend some hours of the day without the brace on.  It gets tired and sore.  Still in recliner but MD said some people stay in longer than others.   using the ice machine several times a day - my husband helps me set it up.  Next MD follow up mid Nov  Hand dominance: Right  Date of surgery 06/16/2024 s/p right shoulder scope with rotator cuff repair, biceps tenodesis and subacromial decompression Pt reports that MD advised her that she could sleep with sling off  with pillow support and that she may have her arm straight with support A/ROM to begin at 6 weeks  PERTINENT HISTORY: Right shoulder scope with rotator cuff repair, biceps tenodesis, and subacromial decompression on 06/16/24 Knee scope 12/29/23 Can't take  NSAIDS b/c of gastric bypass  PAIN:   Are you having pain? Yes NPRS scale: 3/10 anterior/lateral shoulder  Pain location: right elbow - achy comes and goes - located at lateral epicondyle, right shoulder achy Pain orientation: Right  PAIN TYPE: aching Pain description: constant  Aggravating factors: post surgical pain mostly well managed Relieving factors: medication   PRECAUTIONS: Shoulder surgical   WEIGHT BEARING RESTRICTIONS: No  FALLS:  Has patient fallen in last 6 months? 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  PLOF: Independent  PATIENT GOALS:baseline be 75% ; shopping, goes to [employer] stretching class in the [place]   NEXT MD VISIT:   OBJECTIVE:  Note: Objective measures were completed at Evaluation unless otherwise noted.  DIAGNOSTIC FINDINGS:  Full thickness tear of subscapularis and supraspinatus and biceps tear  PATIENT SURVEYS:  Quick Dash:  QUICK DASH  Please rate your ability do the following activities in the last week by selecting the number below the appropriate response.   Activities Rating  Open a tight or new jar.  5 = Unable  Do heavy household chores (e.g., wash walls, floors). 5 = Unable  Carry a shopping bag or briefcase 5 = Unable  Wash your back. 5 = Unable  Use a knife to cut food. 5 = Unable  Recreational activities in which you take some force or impact through your arm, shoulder or hand (e.g., golf, hammering, tennis, etc.). 5 = Unable  During the past week, to what extent has your arm, shoulder or hand problem interfered with your  normal social activities with family, friends, neighbors or groups?  4 = Quite a bit  During the past week, were you limited in your work or other regular daily activities as a result of your arm, shoulder or hand problem? 5 = Unable  Rate the severity of the following symptoms in the last week: Arm, Shoulder, or hand pain. 4 = Severe  Rate the severity of the following symptoms in the last week: Tingling (pins and needles) in your arm, shoulder or hand. 1 = none  During the past week, how much difficulty have you had sleeping because of the pain in your arm, shoulder or hand?  3 = Moderate difficulty   (A QuickDASH score may not be calculated if there is greater than 1 missing item.)  Quick Dash Disability/Symptom Score: 81.8%   Minimally Clinically Important Difference (MCID): 15-20 points  Flavio, F. et al. (2013). Minimally clinically important difference of the disabilities of the arm, shoulder, and hand outcome measures (DASH) and its shortened version (Quick DASH). Journal of Orthopaedic & Sports Physical Therapy, 44(1), 30-39)   COGNITION: Overall cognitive status: Within functional limits for tasks assessed     SENSATION: Denies numbness/tingling  APPEARANCE:  3 portal incisions with steri strips; no redness; no drainage; amount of edema consistent with 1 week post op    UPPER EXTREMITY ROM:  Passive ROM Right eval   Shoulder flexion 15   Shoulder extension    Shoulder abduction 10   Shoulder adduction    Shoulder extension    Shoulder internal rotation 45   Shoulder external rotation 0   Elbow flexion    Elbow extension    Wrist flexion    Wrist extension    Wrist ulnar deviation    Wrist radial deviation    Wrist pronation  Wrist supination     (Blank rows = not tested)   Active ROM Right eval Left eval  Shoulder flexion Not tested secondary to surgical precautions WFLS  Shoulder extension    Shoulder abduction  WFLS  Shoulder adduction     Shoulder internal rotation  WFLs  Shoulder external rotation  WFLs  Elbow flexion    Elbow extension    Wrist flexion    Wrist extension    Wrist ulnar deviation    Wrist radial deviation    Wrist pronation    Wrist supination    (Blank rows = not tested)  UPPER EXTREMITY MMT:  MMT Right eval Left eval  Shoulder flexion Not tested secondary to surgical precautions 5  Shoulder extension  5  Shoulder abduction  5  Shoulder adduction    Shoulder internal rotation  5  Shoulder external rotation  5  Middle trapezius    Lower trapezius    Elbow flexion  5  Elbow extension  5  Wrist flexion  5  Wrist extension  5  Wrist ulnar deviation    Wrist radial deviation    Wrist pronation    Wrist supination    Grip strength (lbs)    (Blank rows = not tested)                                                                                                                              TREATMENT DATE:  07/31/24 Discussion about next phase of protocol - AA/ROM and weaning brace Supine AA/ROM Rt shoulder flexion painfree range (about 150 deg) - holding Rt wrist with Lt hand x10  Tried dowel supine flexion but pain and not enough support of Rt UE at this time Prone Rt arm row to neutral x10 Seated Rt shoulder flexion slides along table - tolerated about 8 reps before sore  Standing shoulder isometrics 5x5 each: flexion, extension, ER, abd Seated ranger with elbow at 90 deg - 10x circles each way HEP update while Pt on recumbent bike L2 x 8'  07/26/24 Theracane to levator scap to rhomboids pt plans to purchase one for home use as this worked well for trigger point release Discussed expected progression to active assisted ROM next week Seated chair STM  levator scap TP release on Rt  with levator scap stretch concurrently Seated Rt levator stretch with OP x20 Supine P/ROM: ER to neutral, Rt shoulder elevation to 90, right shoulder abduction 30 degrees, passive elbow flexion and  extension avoiding end range elbow ext 3 rounds of 10 each Sidelying scapular mobility medial /lateral and superior/inferior    07/24/24 Seated chair STM intrascap bil, cervical paraspinals bil, levator scap TP release on Rt  Seated Rt levator stretch with OP 3x20 Seated guided AA/ROM with OP lateral ribcage translation, lateral translation with contralateral rotation with OP, add costal expansion breathing Supine P/ROM: ER to neutral, Rt shoulder elevation to 90, right shoulder abduction 30 degrees, passive elbow  flexion and extension avoiding end range elbow ext 3 rounds of 10 each PT P/ROM to 90 and 100 deg Rt shoulder with Pt cue to isometrically hold in place x3-5 sec x10 reps, P/ROM arm back to side   PATIENT EDUCATION: Education details: BERDINE, Educated patient on anatomy and physiology of current symptoms, prognosis, plan of care as well as initial self care strategies to promote recovery  Person educated: Patient Education method: Explanation Education comprehension: verbalized understanding  HOME EXERCISE PROGRAM: Access Code: [email or web address] URL: [email or web address] Date: 07/31/2024 Prepared by: Orvil Clarke Peretz  Exercises - Supine Cervical Retraction with Towel  - 2 x daily - 7 x weekly - 1 sets - 10 reps - 10 hold - Supine Cervical Rotation AROM on Pillow  - 2 x daily - 7 x weekly - 1 sets - 5 reps - 10 hold - Seated Scapular Retraction  - 2 x daily - 7 x weekly - 1 sets - 10 reps - Seated Cervical Rotation AROM  - 1 x daily - 7 x weekly - 2 sets - 10 reps - Seated Cervical Sidebending AROM  - 1 x daily - 7 x weekly - 2 sets - 10 reps - Seated Cervical Retraction  - 1 x daily - 7 x weekly - 2 sets - 10 reps - Seated Gripping Towel  - 1 x daily - 7 x weekly - 2 sets - 10 reps - Wrist Flexion Extension AROM - Palms Down  - 1 x daily - 7 x weekly - 2 sets - 10 reps - Closing and Opening Hand With Shoulder Sling  - 1 x daily - 7 x weekly - 2 sets - 10 reps -  Seated Shoulder Pendulum Exercise  - 1 x daily - 7 x weekly - 2 sets - 10 reps - Supine Shoulder Flexion AAROM  - 2 x daily - 7 x weekly - 2 sets - 10 reps - Prone Shoulder Row  - 2 x daily - 7 x weekly - 2 sets - 10 reps - Standing Isometric Shoulder External Rotation with Doorway  - 1 x daily - 7 x weekly - 1 sets - 5 reps - 5 hold - Standing Isometric Shoulder Abduction with Doorway - Arm Bent  - 1 x daily - 7 x weekly - 1 sets - 5 reps - 5 hold - Standing Isometric Shoulder Flexion with Doorway - Arm Bent  - 1 x daily - 7 x weekly - 1 sets - 5 reps - 5 hold - Standing Isometric Shoulder Extension with Doorway - Arm Bent  - 1 x daily - 7 x weekly - 1 sets - 5 reps - 5 hold ASSESSMENT:  CLINICAL IMPRESSION: Jossalyn saw surgeon and he encouraged her to start weaning brace with goal of d/c by Thanksgiving.  She grows sore but has started doing light activity out of brace for hours at at time.  We initiated AA/ROM in supine and sitting today.  Pt grows sore quickly with seated AA/ROM but did well in supine.  Prefers holding wrist vs using dowel at this time for more support.  Shoulder isometrics added to HEP today as well.  We discussed that if activity is outside of peripheral vision she needs to turn her body vs reach.    Eval: Patient is a 48 y.o. female who was seen today for physical therapy evaluation and treatment 1 week s/p right shoulder scope with rotator cuff repair, biceps tenodesis and subacromial decompression.  The patient would  benefit from skilled PT to address shoulder range of motion limitations while following surgical precautions. When appropriate will progress from passive ROM to active assisted to active ROM and progressive strengthening.  Her current impairments and pain affect ability to perform basic ADLs including personal hygiene, dressing and sleeping and she is unable to work at this time.   OBJECTIVE IMPAIRMENTS: decreased activity tolerance, decreased ROM, decreased  strength, increased edema, impaired perceived functional ability, impaired UE functional use, and pain.   ACTIVITY LIMITATIONS: carrying, lifting, sleeping, bed mobility, bathing, toileting, dressing, reach over head, and hygiene/grooming  PARTICIPATION LIMITATIONS: meal prep, cleaning, laundry, driving, shopping, community activity, and occupation  PERSONAL FACTORS: Time since onset of injury/illness/exacerbation are also affecting patient's functional outcome.   REHAB POTENTIAL: Good  CLINICAL DECISION MAKING: Stable/uncomplicated  EVALUATION COMPLEXITY: Low   GOALS: Goals reviewed with patient? Yes  SHORT TERM GOALS: Target date: 08/04/2024     The patient will demonstrate knowledge of basic self care strategies and exercises to promote healing  Baseline: Goal status: met 11/17  2.  The patient will report a 40% improvement in pain levels with functional activities which are currently difficult including sleeping, dressing, bathing Baseline:  Goal status: ONGOING, SLEEPING IN RECLINER 11/17  3.  Passive elevation to 150 degrees needed for overhead reaching Baseline:  Goal status: met 11/17  4.  Quick DASH functional outcome score improved to 70% indicating improved function with less pain Baseline:  Goal status: INITIAL     LONG TERM GOALS: Target date: 09/15/2024    The patient will be independent in a safe self progression of a home exercise program to promote further recovery of function  Baseline:  Goal status: INITIAL  2.  The patient will report a 75% improvement in pain levels with functional activities which are currently difficult including sleeping, grooming/dressing, return to work Baseline:  Goal status: INITIAL  3.  The patient will have improved active shoulder elevation ROM to at least 130 degrees needed for grooming/dressing purposes as well as reaching high shelves  Baseline:  Goal status: INITIAL  4.  The patient will be have shoulder external  and internal rotation to 40 degrees for dressing Baseline:  Goal status: INITIAL  5.  The patient will have grossly 4/5 strength needed to lift and lower a 2# object from a high shelf  Baseline:  Goal status: INITIAL  6.  Quick DASH functional outcome score improved to 60% indicating improved function with less pain Baseline:  Goal status: INITIAL  PLAN:  PT FREQUENCY: 2x/week  PT DURATION: 12 weeks  PLANNED INTERVENTIONS: 97164- PT Re-evaluation, 97750- Physical Performance Testing, 97110-Therapeutic exercises, 97530- Therapeutic activity, 97112- Neuromuscular re-education, 97535- Self Care, [id number]- Manual therapy, [id number]- Aquatic Therapy, [id number]- Electrical stimulation (unattended), [phone]- Electrical stimulation (manual), 97016- Vasopneumatic device, Patient/Family education, Taping, Joint mobilization, Cryotherapy, and Moist heat  PLAN FOR NEXT SESSION: do quickDASH to update STG, progression to active assisted ROM; glenohumeral and scapular mobilization; cervical ROM, wrist and hand ROM    Orvil Fester, PT 07/31/24 5:03 PM  Phone: [phone] Fax: [phone]  [facility] [address] [place], [place] [address] Phone # [phone] Fax [phone]

## 2024-08-01 DIAGNOSIS — F4310 Post-traumatic stress disorder, unspecified: Secondary | ICD-10-CM | POA: Diagnosis not present

## 2024-08-03 ENCOUNTER — Encounter: Payer: Self-pay | Admitting: Physical Therapy

## 2024-08-03 ENCOUNTER — Ambulatory Visit: Admitting: Physical Therapy

## 2024-08-03 DIAGNOSIS — M6281 Muscle weakness (generalized): Secondary | ICD-10-CM

## 2024-08-03 DIAGNOSIS — M25611 Stiffness of right shoulder, not elsewhere classified: Secondary | ICD-10-CM | POA: Diagnosis not present

## 2024-08-03 DIAGNOSIS — R600 Localized edema: Secondary | ICD-10-CM

## 2024-08-03 DIAGNOSIS — M25511 Pain in right shoulder: Secondary | ICD-10-CM

## 2024-08-03 NOTE — Therapy (Signed)
 OUTPATIENT PHYSICAL THERAPY SHOULDER TREATMENT   Patient Name: Caroline Gonzales MRN: [id number] DOB:12-03-1975, 48 y.o., female Today's Date: 08/03/2024  END OF SESSION:  PT End of Session - 08/03/24 1620     Visit Number 12    Date for Recertification  09/15/24    Authorization Type [employer]    PT Start Time 1621    PT Stop Time 1655    PT Time Calculation (min) 34 min    Activity Tolerance Patient tolerated treatment well    Behavior During Therapy [facility] for tasks assessed/performed               Past Medical History:  Diagnosis Date   Acute costochondritis 11/14/2021   Allergy    Zyrtec .   Anxiety    followed by Dr. Melba   Back pain    Complication of anesthesia    PONV   Depression    Elevated blood pressure reading without diagnosis of hypertension 12/29/2021   Fibroid    Gastroparesis 09/14/2012   gastric emptying study in 2014   GERD (gastroesophageal reflux disease)    Headache    Hypertriglyceridemia 10/07/2017   Nightmares 07/21/2018   Pneumonia    2013ish   PONV (postoperative nausea and vomiting)     likes scopolamine  patch and zofran  /phenergan  helps   Pre-diabetes    PTSD (post-traumatic stress disorder)    Residual foreign body in soft tissue 11/12/2016   Screening for colon cancer 07/02/2022   Sleep apnea 07/01/2018   cpap optional, pt close to not use cpap   Vitamin D  deficiency    Wears glasses    Past Surgical History:  Procedure Laterality Date   ABDOMINAL HYSTERECTOMY  01/2021   ANKLE ARTHROSCOPY Left 2011   BICEPT TENODESIS Right 06/16/2024   Procedure: TENODESIS, BICEPS;  Surgeon: Sharl Selinda Dover, MD;  Location: [facility];  Service: Orthopedics;  Laterality: Right;   CESAREAN SECTION  06/2006   x 1   CHOLECYSTECTOMY  07/2006   laparoscopic   CYSTOSCOPY N/A 02/04/2021   Procedure: CYSTOSCOPY;  Surgeon: Jannis Kate Norris, MD;  Location: [facility] OR;  Service: Gynecology;  Laterality: N/A;   DILATION AND  CURETTAGE OF UTERUS  1997   x 2   FOREIGN BODY REMOVAL Left 11/18/2016   Procedure: REMOVAL FOREIGN BODY EXTREMITY LEFT FOOT;  Surgeon: Donnice JONELLE Fees, DPM;  Location: [facility] OR;  Service: Podiatry;  Laterality: Left;   GASTRIC ROUX-EN-Y N/A 04/13/2023   Procedure: LAPAROSCOPIC ROUX-EN-Y GASTRIC BYPASS WITH UPPER ENDOSCOPY;  Surgeon: Signe Mitzie LABOR, MD;  Location: [facility];  Service: General;  Laterality: N/A;   KNEE ARTHROSCOPY WITH MEDIAL MENISECTOMY Right 09/10/2021   Procedure: KNEE ARTHROSCOPY WITH MEDIAL MENISCAL ROOT REPAIR;  Surgeon: Cristy Bonner DASEN, MD;  Location: [facility];  Service: Orthopedics;  Laterality: Right;   PILONIDAL CYST EXCISION  1990's   RADIOLOGY WITH ANESTHESIA N/A 11/10/2018   Procedure: MRI WITH ANESTHESIA OF BRAIN AND ORBITS WITH AND WITHOUT CONTRAST;  Surgeon: Radiologist, Medication, MD;  Location: [facility] OR;  Service: Radiology;  Laterality: N/A;   SHOULDER ARTHROSCOPY WITH ROTATOR CUFF REPAIR Right 06/16/2024   Procedure: ARTHROSCOPY, SHOULDER, WITH ROTATOR CUFF REPAIR;  Surgeon: Sharl Selinda Dover, MD;  Location: [facility];  Service: Orthopedics;  Laterality: Right;   SUBACROMIAL DECOMPRESSION Right 06/16/2024   Procedure: DECOMPRESSION, SUBACROMIAL SPACE;  Surgeon: Sharl Selinda Dover, MD;  Location: [facility];  Service: Orthopedics;  Laterality: Right;  Right shoulder  arthroscopy with rotator cuff repair, biceps tenodesis, subacromial decompression, distal clavicle excision   TENDON REPAIR Left 2011   Left Ankle   TOTAL LAPAROSCOPIC HYSTERECTOMY WITH SALPINGECTOMY Bilateral 02/04/2021   Procedure: TOTAL LAPAROSCOPIC HYSTERECTOMY WITH SALPINGECTOMY;  Surgeon: Jannis Kate Norris, MD;  Location: [facility] OR;  Service: Gynecology;  Laterality: Bilateral;   UPPER GASTROINTESTINAL ENDOSCOPY     WISDOM TOOTH EXTRACTION  1990's   Patient Active Problem List   Diagnosis Date Noted   History of nocturnal hypoglycemia  03/02/2024   Encounter for annual physical exam 03/02/2024   Family history of elevated lipoprotein (a) 04/26/2023   PVC's (premature ventricular contractions) 04/26/2023   Morbid obesity (HCC) 04/13/2023   Gastroesophageal reflux disease 12/24/2022   Abnormal laboratory test 12/24/2022   Chest wall pain 12/24/2022   Change in facial mole 12/29/2021   S/P laparoscopic hysterectomy 02/04/2021   PTSD (post-traumatic stress disorder) 07/21/2018   GAD (generalized anxiety disorder) 07/21/2018   Insomnia 07/21/2018   Recurrent major depressive disorder, in partial remission 07/21/2018   Insulin  resistance 10/21/2017   Hypertriglyceridemia 10/07/2017   Vitamin D  deficiency 10/07/2017   Pre-diabetes 11/02/2016   Morbid obesity with body mass index (BMI) of 50.0 to 59.9 in adult (HCC) 10/15/2016    PCP: Oris Credit NP  REFERRING PROVIDER: Sharl Mayo MD  REFERRING DIAG: s/p right shoulder scope with rotator cuff repair, biceps tenodesis and subacromial decompression  THERAPY DIAG: right shoulder pain; right shoulder stiffness; weakness   Rationale for Evaluation and Treatment: Rehabilitation  ONSET DATE: Spring 2025  SUBJECTIVE:                                                                                                                                                                                      SUBJECTIVE STATEMENT: I am mostly out of the brace. I was able to fold laundry today.  Still sleeping in recliner but without the brace. No pain today.   using the ice machine several times a day - my husband helps me set it up.  Next MD follow up mid Nov  Hand dominance: Right  Date of surgery 06/16/2024 s/p right shoulder scope with rotator cuff repair, biceps tenodesis and subacromial decompression Pt reports that MD advised her that she could sleep with sling off with pillow support and that she may have her arm straight with support A/ROM to begin at 6  weeks  PERTINENT HISTORY: Right shoulder scope with rotator cuff repair, biceps tenodesis, and subacromial decompression on 06/16/24 Knee scope 12/29/23 Can't take NSAIDS b/c of gastric bypass  PAIN:   Are you having pain? Yes NPRS scale: 0/10 anterior/lateral shoulder  Pain location:  right elbow - achy comes and goes - located at lateral epicondyle, right shoulder achy Pain orientation: Right  PAIN TYPE: aching Pain description: constant  Aggravating factors: post surgical pain mostly well managed Relieving factors: medication   PRECAUTIONS: Shoulder surgical   WEIGHT BEARING RESTRICTIONS: No  FALLS:  Has patient fallen in last 6 months? 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  PLOF: Independent  PATIENT GOALS:baseline be 75% ; shopping, goes to [employer] stretching class in the water   NEXT MD VISIT:   OBJECTIVE:  Note: Objective measures were completed at Evaluation unless otherwise noted.  DIAGNOSTIC FINDINGS:  Full thickness tear of subscapularis and supraspinatus and biceps tear  PATIENT SURVEYS:  Quick Dash:  QUICK DASH  Please rate your ability do the following activities in the last week by selecting the number below the appropriate response.   Activities Rating  Open a tight or new jar.  5 = Unable  Do heavy household chores (e.g., wash walls, floors). 5 = Unable  Carry a shopping bag or briefcase 5 = Unable  Wash your back. 5 = Unable  Use a knife to cut food. 5 = Unable  Recreational activities in which you take some force or impact through your arm, shoulder or hand (e.g., golf, hammering, tennis, etc.). 5 = Unable  During the past week, to what extent has your arm, shoulder or hand problem interfered with your normal social activities with family, friends, neighbors or groups?  4 = Quite a bit  During the  past week, were you limited in your work or other regular daily activities as a result of your arm, shoulder or hand problem? 5 = Unable  Rate the severity of the following symptoms in the last week: Arm, Shoulder, or hand pain. 4 = Severe  Rate the severity of the following symptoms in the last week: Tingling (pins and needles) in your arm, shoulder or hand. 1 = none  During the past week, how much difficulty have you had sleeping because of the pain in your arm, shoulder or hand?  3 = Moderate difficulty   (A QuickDASH score may not be calculated if there is greater than 1 missing item.)  Quick Dash Disability/Symptom Score: 81.8%   Minimally Clinically Important Difference (MCID): 15-20 points  Flavio, F. et al. (2013). Minimally clinically important difference of the disabilities of the arm, shoulder, and hand outcome measures (DASH) and its shortened version (Quick DASH). Journal of Orthopaedic & Sports Physical Therapy, 44(1), 30-39)   COGNITION: Overall cognitive status: Within functional limits for tasks assessed     SENSATION: Denies numbness/tingling  APPEARANCE:  3 portal incisions with steri strips; no redness; no drainage; amount of edema consistent with 1 week post op    UPPER EXTREMITY ROM:  Passive ROM Right eval   Shoulder flexion 15   Shoulder extension    Shoulder abduction 10   Shoulder adduction    Shoulder extension    Shoulder internal rotation 45   Shoulder external rotation 0   Elbow flexion    Elbow extension    Wrist flexion    Wrist extension    Wrist ulnar deviation    Wrist radial deviation    Wrist pronation    Wrist supination     (Blank rows = not tested)   Active ROM Right eval Left eval  Shoulder flexion Not tested secondary to surgical precautions WFLS  Shoulder extension    Shoulder abduction  WFLS  Shoulder adduction    Shoulder internal rotation  WFLs  Shoulder external rotation  WFLs  Elbow flexion    Elbow extension     Wrist flexion    Wrist extension    Wrist ulnar deviation    Wrist radial deviation    Wrist pronation    Wrist supination    (Blank rows = not tested)  UPPER EXTREMITY MMT:  MMT Right eval Left eval  Shoulder flexion Not tested secondary to surgical precautions 5  Shoulder extension  5  Shoulder abduction  5  Shoulder adduction    Shoulder internal rotation  5  Shoulder external rotation  5  Middle trapezius    Lower trapezius    Elbow flexion  5  Elbow extension  5  Wrist flexion  5  Wrist extension  5  Wrist ulnar deviation    Wrist radial deviation    Wrist pronation    Wrist supination    Grip strength (lbs)    (Blank rows = not tested)                                                                                                                              TREATMENT DATE:  08/03/24 Recumbent bike L2x5 PT present to discuss status Prone row to neutral with Rt UE off mat table 2x10 Supine AA/ROM Rt UE flexion painfree range (approx 150 deg) 2x12 - Pt holds wrist Dowel Rt shoulder ER to 30 deg supine 2x10 Supine isometrics Rt shoulder 5x5 - elbow into table for ext, PT provided resistance for ER, abd Standing finger climb Rt UE and wall slide - Pt prefers wall slide - less soreness Seated shoulder pulleys 2' flexion and scaption Seated scapular squeeze 10x5  07/31/24 Discussion about next phase of protocol - AA/ROM and weaning brace Supine AA/ROM Rt shoulder flexion painfree range (about 150 deg) - holding Rt wrist with Lt hand x10  Tried dowel supine flexion but pain and not enough support of Rt UE at this time Prone Rt arm row to neutral x10 Seated Rt shoulder flexion slides along table - tolerated about 8 reps before sore  Standing shoulder isometrics 5x5 each: flexion, extension, ER, abd Seated ranger with elbow at 90 deg - 10x circles each way HEP update while Pt on recumbent bike L2 x 8'  07/26/24 Theracane to levator scap to rhomboids pt plans  to purchase one for home use as this worked well for trigger point release Discussed expected progression to active assisted ROM next week Seated chair STM  levator scap TP release on Rt  with levator scap stretch concurrently Seated Rt levator stretch with OP x20 Supine P/ROM: ER to neutral, Rt shoulder elevation to 90, right shoulder abduction 30 degrees, passive elbow flexion and extension avoiding end range elbow ext 3 rounds of 10 each  Sidelying scapular mobility medial /lateral and superior/inferior   PATIENT EDUCATION: Education details: BERDINE, Educated patient on anatomy and physiology of current symptoms, prognosis, plan of care as well as initial self care strategies to promote recovery  Person educated: Patient Education method: Explanation Education comprehension: verbalized understanding  HOME EXERCISE PROGRAM: Access Code: [email or web address] URL: [email or web address] Date: 08/03/2024 Prepared by: Orvil Jonmichael Beadnell  Exercises - Supine Cervical Retraction with Towel  - 2 x daily - 7 x weekly - 1 sets - 10 reps - 10 hold - Supine Cervical Rotation AROM on Pillow  - 2 x daily - 7 x weekly - 1 sets - 5 reps - 10 hold - Seated Scapular Retraction  - 2 x daily - 7 x weekly - 1 sets - 10 reps - Seated Cervical Rotation AROM  - 1 x daily - 7 x weekly - 2 sets - 10 reps - Seated Cervical Sidebending AROM  - 1 x daily - 7 x weekly - 2 sets - 10 reps - Seated Cervical Retraction  - 1 x daily - 7 x weekly - 2 sets - 10 reps - Seated Gripping Towel  - 1 x daily - 7 x weekly - 2 sets - 10 reps - Wrist Flexion Extension AROM - Palms Down  - 1 x daily - 7 x weekly - 2 sets - 10 reps - Closing and Opening Hand With Shoulder Sling  - 1 x daily - 7 x weekly - 2 sets - 10 reps - Seated Shoulder Pendulum Exercise  - 1 x daily - 7 x weekly - 2 sets - 10 reps - Supine Shoulder Flexion AAROM  - 2 x daily - 7 x weekly - 2 sets - 10 reps - Prone Shoulder Row  - 2 x daily - 7 x weekly - 2  sets - 10 reps - Standing Isometric Shoulder External Rotation with Doorway  - 1 x daily - 7 x weekly - 1 sets - 5 reps - 5 hold - Standing Isometric Shoulder Abduction with Doorway - Arm Bent  - 1 x daily - 7 x weekly - 1 sets - 5 reps - 5 hold - Standing Isometric Shoulder Flexion with Doorway - Arm Bent  - 1 x daily - 7 x weekly - 1 sets - 5 reps - 5 hold - Standing Isometric Shoulder Extension with Doorway - Arm Bent  - 1 x daily - 7 x weekly - 1 sets - 5 reps - 5 hold - Shoulder Flexion Wall Slide with Towel  - 2-3 x daily - 7 x weekly - 2 sets - 10 reps - Seated Shoulder Flexion AAROM with Pulley Behind  - 1 x daily - 7 x weekly - 1 sets - 60-120 reps - Seated Shoulder Scaption AAROM with Pulley at Side  - 1 x daily - 7 x weekly - 1 sets - 60-120 reps ASSESSMENT:  CLINICAL IMPRESSION: Adaya has weaned brace during the day and has been able to incorporate Rt surgical UE into light daily tasks such as folding laundry on elevated surface.  She continues to choose to sleep in recliner but isn't needing brace for sleep.  She had no pain all day today but was a little fatigued within PT session having used the arm during the day.  She prefers wall slide to finger walk in standing for AA/ROM due to less soreness with more supportive strategy of slide.  She also tolerated the shoulder pulleys well so we added this to  HEP.  She did not yet feel ready to try supine dowel but rather uses Lt UE to assist Rt for flexion from that position.  Pt will be traveling next week and will return to PT the week after.  Eval: Patient is a 48 y.o. female who was seen today for physical therapy evaluation and treatment 1 week s/p right shoulder scope with rotator cuff repair, biceps tenodesis and subacromial decompression.  The patient would benefit from skilled PT to address shoulder range of motion limitations while following surgical precautions. When appropriate will progress from passive ROM to active assisted to  active ROM and progressive strengthening.  Her current impairments and pain affect ability to perform basic ADLs including personal hygiene, dressing and sleeping and she is unable to work at this time.   OBJECTIVE IMPAIRMENTS: decreased activity tolerance, decreased ROM, decreased strength, increased edema, impaired perceived functional ability, impaired UE functional use, and pain.   ACTIVITY LIMITATIONS: carrying, lifting, sleeping, bed mobility, bathing, toileting, dressing, reach over head, and hygiene/grooming  PARTICIPATION LIMITATIONS: meal prep, cleaning, laundry, driving, shopping, community activity, and occupation  PERSONAL FACTORS: Time since onset of injury/illness/exacerbation are also affecting patient's functional outcome.   REHAB POTENTIAL: Good  CLINICAL DECISION MAKING: Stable/uncomplicated  EVALUATION COMPLEXITY: Low   GOALS: Goals reviewed with patient? Yes  SHORT TERM GOALS: Target date: 08/04/2024     The patient will demonstrate knowledge of basic self care strategies and exercises to promote healing  Baseline: Goal status: met 11/17  2.  The patient will report a 40% improvement in pain levels with functional activities which are currently difficult including sleeping, dressing, bathing Baseline:  Goal status: ONGOING, SLEEPING IN RECLINER 11/17  3.  Passive elevation to 150 degrees needed for overhead reaching Baseline:  Goal status: met 11/17  4.  Quick DASH functional outcome score improved to 70% indicating improved function with less pain Baseline:  Goal status: INITIAL     LONG TERM GOALS: Target date: 09/15/2024    The patient will be independent in a safe self progression of a home exercise program to promote further recovery of function  Baseline:  Goal status: INITIAL  2.  The patient will report a 75% improvement in pain levels with functional activities which are currently difficult including sleeping, grooming/dressing, return to  work Baseline:  Goal status: INITIAL  3.  The patient will have improved active shoulder elevation ROM to at least 130 degrees needed for grooming/dressing purposes as well as reaching high shelves  Baseline:  Goal status: INITIAL  4.  The patient will be have shoulder external and internal rotation to 40 degrees for dressing Baseline:  Goal status: INITIAL  5.  The patient will have grossly 4/5 strength needed to lift and lower a 2# object from a high shelf  Baseline:  Goal status: INITIAL  6.  Quick DASH functional outcome score improved to 60% indicating improved function with less pain Baseline:  Goal status: INITIAL  PLAN:  PT FREQUENCY: 2x/week  PT DURATION: 12 weeks  PLANNED INTERVENTIONS: 97164- PT Re-evaluation, 97750- Physical Performance Testing, 97110-Therapeutic exercises, 97530- Therapeutic activity, [id number]- Neuromuscular re-education, 97535- Self Care, [id number]- Manual therapy, [id number]- Aquatic Therapy, [id number]- Electrical stimulation (unattended), [id number]- Electrical stimulation (manual), 97016- Vasopneumatic device, Patient/Family education, Taping, Joint mobilization, Cryotherapy, and Moist heat  PLAN FOR NEXT SESSION: progression to active assisted ROM; glenohumeral and scapular mobilization; cervical ROM, wrist and hand ROM    Cardell Rachel, PT 08/03/24 5:00 PM    Phone: [phone]  Fax: [phone]  [facility] [address] [place], [place] [address] Phone # [phone] Fax [phone]

## 2024-08-07 ENCOUNTER — Encounter: Admitting: Physical Therapy

## 2024-08-08 DIAGNOSIS — F4310 Post-traumatic stress disorder, unspecified: Secondary | ICD-10-CM | POA: Diagnosis not present

## 2024-08-14 ENCOUNTER — Ambulatory Visit

## 2024-08-14 ENCOUNTER — Other Ambulatory Visit: Payer: Self-pay | Admitting: Nurse Practitioner

## 2024-08-14 ENCOUNTER — Other Ambulatory Visit: Payer: Self-pay

## 2024-08-14 ENCOUNTER — Other Ambulatory Visit (HOSPITAL_COMMUNITY): Payer: Self-pay

## 2024-08-14 DIAGNOSIS — R7303 Prediabetes: Secondary | ICD-10-CM

## 2024-08-14 DIAGNOSIS — R600 Localized edema: Secondary | ICD-10-CM | POA: Diagnosis present

## 2024-08-14 DIAGNOSIS — M6281 Muscle weakness (generalized): Secondary | ICD-10-CM | POA: Insufficient documentation

## 2024-08-14 DIAGNOSIS — R293 Abnormal posture: Secondary | ICD-10-CM | POA: Insufficient documentation

## 2024-08-14 DIAGNOSIS — M25511 Pain in right shoulder: Secondary | ICD-10-CM | POA: Diagnosis present

## 2024-08-14 DIAGNOSIS — M25611 Stiffness of right shoulder, not elsewhere classified: Secondary | ICD-10-CM | POA: Diagnosis present

## 2024-08-14 MED ORDER — ONDANSETRON 8 MG PO TBDP
8.0000 mg | ORAL_TABLET | Freq: Three times a day (TID) | ORAL | 1 refills | Status: AC | PRN
  Filled 2024-08-14: qty 30, 10d supply, fill #0

## 2024-08-14 NOTE — Therapy (Signed)
 OUTPATIENT PHYSICAL THERAPY SHOULDER TREATMENT   Patient Name: Caroline Gonzales MRN: [id number] DOB:Jan 22, 1976, 48 y.o., female Today's Date: 08/14/2024  END OF SESSION:  PT End of Session - 08/14/24 1620     Visit Number 13    Date for Recertification  09/15/24    Authorization Type [employer]    PT Start Time 1618    PT Stop Time 1700    PT Time Calculation (min) 42 min    Activity Tolerance Patient tolerated treatment well    Behavior During Therapy [facility] for tasks assessed/performed               Past Medical History:  Diagnosis Date   Acute costochondritis 11/14/2021   Allergy    Zyrtec .   Anxiety    followed by Dr. Melba   Back pain    Complication of anesthesia    PONV   Depression    Elevated blood pressure reading without diagnosis of hypertension 12/29/2021   Fibroid    Gastroparesis 09/14/2012   gastric emptying study in 2014   GERD (gastroesophageal reflux disease)    Headache    Hypertriglyceridemia 10/07/2017   Nightmares 07/21/2018   Pneumonia    2013ish   PONV (postoperative nausea and vomiting)     likes scopolamine  patch and zofran  /phenergan  helps   Pre-diabetes    PTSD (post-traumatic stress disorder)    Residual foreign body in soft tissue 11/12/2016   Screening for colon cancer 07/02/2022   Sleep apnea 07/01/2018   cpap optional, pt close to not use cpap   Vitamin D  deficiency    Wears glasses    Past Surgical History:  Procedure Laterality Date   ABDOMINAL HYSTERECTOMY  01/2021   ANKLE ARTHROSCOPY Left 2011   BICEPT TENODESIS Right 06/16/2024   Procedure: TENODESIS, BICEPS;  Surgeon: Sharl Selinda Dover, MD;  Location: [facility];  Service: Orthopedics;  Laterality: Right;   CESAREAN SECTION  06/2006   x 1   CHOLECYSTECTOMY  07/2006   laparoscopic   CYSTOSCOPY N/A 02/04/2021   Procedure: CYSTOSCOPY;  Surgeon: Jannis Kate Norris, MD;  Location: [facility] OR;  Service: Gynecology;  Laterality: N/A;   DILATION AND  CURETTAGE OF UTERUS  1997   x 2   FOREIGN BODY REMOVAL Left 11/18/2016   Procedure: REMOVAL FOREIGN BODY EXTREMITY LEFT FOOT;  Surgeon: Donnice JONELLE Fees, DPM;  Location: [facility] OR;  Service: Podiatry;  Laterality: Left;   GASTRIC ROUX-EN-Y N/A 04/13/2023   Procedure: LAPAROSCOPIC ROUX-EN-Y GASTRIC BYPASS WITH UPPER ENDOSCOPY;  Surgeon: Signe Mitzie LABOR, MD;  Location: [facility];  Service: General;  Laterality: N/A;   KNEE ARTHROSCOPY WITH MEDIAL MENISECTOMY Right 09/10/2021   Procedure: KNEE ARTHROSCOPY WITH MEDIAL MENISCAL ROOT REPAIR;  Surgeon: Cristy Bonner DASEN, MD;  Location: [facility];  Service: Orthopedics;  Laterality: Right;   PILONIDAL CYST EXCISION  1990's   RADIOLOGY WITH ANESTHESIA N/A 11/10/2018   Procedure: MRI WITH ANESTHESIA OF BRAIN AND ORBITS WITH AND WITHOUT CONTRAST;  Surgeon: Radiologist, Medication, MD;  Location: [facility] OR;  Service: Radiology;  Laterality: N/A;   SHOULDER ARTHROSCOPY WITH ROTATOR CUFF REPAIR Right 06/16/2024   Procedure: ARTHROSCOPY, SHOULDER, WITH ROTATOR CUFF REPAIR;  Surgeon: Sharl Selinda Dover, MD;  Location: [facility];  Service: Orthopedics;  Laterality: Right;   SUBACROMIAL DECOMPRESSION Right 06/16/2024   Procedure: DECOMPRESSION, SUBACROMIAL SPACE;  Surgeon: Sharl Selinda Dover, MD;  Location: [facility];  Service: Orthopedics;  Laterality: Right;  Right shoulder  arthroscopy with rotator cuff repair, biceps tenodesis, subacromial decompression, distal clavicle excision   TENDON REPAIR Left 2011   Left Ankle   TOTAL LAPAROSCOPIC HYSTERECTOMY WITH SALPINGECTOMY Bilateral 02/04/2021   Procedure: TOTAL LAPAROSCOPIC HYSTERECTOMY WITH SALPINGECTOMY;  Surgeon: Jannis Kate Norris, MD;  Location: [facility];  Service: Gynecology;  Laterality: Bilateral;   UPPER GASTROINTESTINAL ENDOSCOPY     WISDOM TOOTH EXTRACTION  1990's   Patient Active Problem List   Diagnosis Date Noted   History of nocturnal hypoglycemia  03/02/2024   Encounter for annual physical exam 03/02/2024   Family history of elevated lipoprotein (a) 04/26/2023   PVC's (premature ventricular contractions) 04/26/2023   Morbid obesity (HCC) 04/13/2023   Gastroesophageal reflux disease 12/24/2022   Abnormal laboratory test 12/24/2022   Chest wall pain 12/24/2022   Change in facial mole 12/29/2021   S/P laparoscopic hysterectomy 02/04/2021   PTSD (post-traumatic stress disorder) 07/21/2018   GAD (generalized anxiety disorder) 07/21/2018   Insomnia 07/21/2018   Recurrent major depressive disorder, in partial remission 07/21/2018   Insulin  resistance 10/21/2017   Hypertriglyceridemia 10/07/2017   Vitamin D  deficiency 10/07/2017   Pre-diabetes 11/02/2016   Morbid obesity with body mass index (BMI) of 50.0 to 59.9 in adult [facility]) 10/15/2016    PCP: Oris Credit NP  REFERRING PROVIDER: Sharl Mayo MD  REFERRING DIAG: s/p right shoulder scope with rotator cuff repair, biceps tenodesis and subacromial decompression  THERAPY DIAG: right shoulder pain; right shoulder stiffness; weakness   Rationale for Evaluation and Treatment: Rehabilitation  ONSET DATE: Spring 2025  SUBJECTIVE:                                                                                                                                                                                      SUBJECTIVE STATEMENT: Using the arm to do most routine daily tasks but still having trouble sleeping.    using the ice machine several times a day - my husband helps me set it up.  Next MD follow up mid Nov  Hand dominance: Right  Date of surgery 06/16/2024 s/p right shoulder scope with rotator cuff repair, biceps tenodesis and subacromial decompression Pt reports that MD advised her that she could sleep with sling off with pillow support and that she may have her arm straight with support A/ROM to begin at 6 weeks  PERTINENT HISTORY: Right shoulder scope with rotator  cuff repair, biceps tenodesis, and subacromial decompression on 06/16/24 Knee scope 12/29/23 Can't take NSAIDS b/c of gastric bypass  PAIN:   Are you having pain? Yes NPRS scale: 0/10 anterior/lateral shoulder  Pain location: right elbow - achy comes and goes - located at lateral  epicondyle, right shoulder achy Pain orientation: Right  PAIN TYPE: aching Pain description: constant  Aggravating factors: post surgical pain mostly well managed Relieving factors: medication   PRECAUTIONS: Shoulder surgical   WEIGHT BEARING RESTRICTIONS: No  FALLS:  Has patient fallen in last 6 months? 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  PLOF: Independent  PATIENT GOALS:baseline be 75% ; shopping, goes to [employer] stretching class in the [place]   NEXT MD VISIT:   OBJECTIVE:  Note: Objective measures were completed at Evaluation unless otherwise noted.  DIAGNOSTIC FINDINGS:  Full thickness tear of subscapularis and supraspinatus and biceps tear  PATIENT SURVEYS:  Quick Dash:  QUICK DASH  Please rate your ability do the following activities in the last week by selecting the number below the appropriate response.   Activities Rating  Open a tight or new jar.  5 = Unable  Do heavy household chores (e.g., wash walls, floors). 5 = Unable  Carry a shopping bag or briefcase 5 = Unable  Wash your back. 5 = Unable  Use a knife to cut food. 5 = Unable  Recreational activities in which you take some force or impact through your arm, shoulder or hand (e.g., golf, hammering, tennis, etc.). 5 = Unable  During the past week, to what extent has your arm, shoulder or hand problem interfered with your normal social activities with family, friends, neighbors or groups?  4 = Quite a bit  During the past week, were you limited in your work or other regular  daily activities as a result of your arm, shoulder or hand problem? 5 = Unable  Rate the severity of the following symptoms in the last week: Arm, Shoulder, or hand pain. 4 = Severe  Rate the severity of the following symptoms in the last week: Tingling (pins and needles) in your arm, shoulder or hand. 1 = none  During the past week, how much difficulty have you had sleeping because of the pain in your arm, shoulder or hand?  3 = Moderate difficulty   (A QuickDASH score may not be calculated if there is greater than 1 missing item.)  Quick Dash Disability/Symptom Score: 81.8%   Minimally Clinically Important Difference (MCID): 15-20 points  Flavio, F. et al. (2013). Minimally clinically important difference of the disabilities of the arm, shoulder, and hand outcome measures (DASH) and its shortened version (Quick DASH). Journal of Orthopaedic & Sports Physical Therapy, 44(1), 30-39)   COGNITION: Overall cognitive status: Within functional limits for tasks assessed     SENSATION: Denies numbness/tingling  APPEARANCE:  3 portal incisions with steri strips; no redness; no drainage; amount of edema consistent with 1 week post op    UPPER EXTREMITY ROM:  Passive ROM Right eval   Shoulder flexion 15   Shoulder extension    Shoulder abduction 10   Shoulder adduction    Shoulder extension    Shoulder internal rotation 45   Shoulder external rotation 0   Elbow flexion    Elbow extension    Wrist flexion    Wrist extension    Wrist ulnar deviation    Wrist radial deviation    Wrist pronation    Wrist supination     (Blank rows = not tested)   Active ROM Right eval Left eval  Shoulder flexion Not tested secondary to surgical precautions WFLS  Shoulder  extension    Shoulder abduction  WFLS  Shoulder adduction    Shoulder internal rotation  WFLs  Shoulder external rotation  WFLs  Elbow flexion    Elbow extension    Wrist flexion    Wrist extension    Wrist ulnar  deviation    Wrist radial deviation    Wrist pronation    Wrist supination    (Blank rows = not tested)  UPPER EXTREMITY MMT:  MMT Right eval Left eval  Shoulder flexion Not tested secondary to surgical precautions 5  Shoulder extension  5  Shoulder abduction  5  Shoulder adduction    Shoulder internal rotation  5  Shoulder external rotation  5  Middle trapezius    Lower trapezius    Elbow flexion  5  Elbow extension  5  Wrist flexion  5  Wrist extension  5  Wrist ulnar deviation    Wrist radial deviation    Wrist pronation    Wrist supination    Grip strength (lbs)    (Blank rows = not tested)                                                                                                                              TREATMENT DATE:  08/14/24 UBE x 6 all fwd Prone shoulder extension to tolerance 2 x 10 with 0# Prone rows 2 x 10 with 0# Prone horizontal abduction 2 x 10 with 0# Side lying ER 0# 2 x 10 Supine serratus punch 0# 2 x 10 Supine shoulder alphabet A-Z x1 PROM all planes of motion Ice to right shoulder x 10 min in supine hook lying  08/03/24 Recumbent bike L2x5 PT present to discuss status Prone row to neutral with Rt UE off mat table 2x10 Supine AA/ROM Rt UE flexion painfree range (approx 150 deg) 2x12 - Pt holds wrist Dowel Rt shoulder ER to 30 deg supine 2x10 Supine isometrics Rt shoulder 5x5 - elbow into table for ext, PT provided resistance for ER, abd Standing finger climb Rt UE and wall slide - Pt prefers wall slide - less soreness Seated shoulder pulleys 2' flexion and scaption Seated scapular squeeze 10x5  07/31/24 Discussion about next phase of protocol - AA/ROM and weaning brace Supine AA/ROM Rt shoulder flexion painfree range (about 150 deg) - holding Rt wrist with Lt hand x10  Tried dowel supine flexion but pain and not enough support of Rt UE at this time Prone Rt arm row to neutral x10 Seated Rt shoulder flexion slides along table -  tolerated about 8 reps before sore  Standing shoulder isometrics 5x5 each: flexion, extension, ER, abd Seated ranger with elbow at 90 deg - 10x circles each way HEP update while Pt on recumbent bike L2 x 8'   PATIENT EDUCATION: Education details: BERDINE, Educated patient on anatomy and physiology of current symptoms, prognosis, plan of care as well as initial self care strategies to promote recovery  Person educated: Patient Education method: Explanation Education comprehension: verbalized understanding  HOME EXERCISE PROGRAM: Access Code: [email or web address] URL: [email or web address] Date: 08/03/2024 Prepared by: Orvil Beuhring  Exercises - Supine Cervical Retraction with Towel  - 2 x daily - 7 x weekly - 1 sets - 10 reps - 10 hold - Supine Cervical Rotation AROM on Pillow  - 2 x daily - 7 x weekly - 1 sets - 5 reps - 10 hold - Seated Scapular Retraction  - 2 x daily - 7 x weekly - 1 sets - 10 reps - Seated Cervical Rotation AROM  - 1 x daily - 7 x weekly - 2 sets - 10 reps - Seated Cervical Sidebending AROM  - 1 x daily - 7 x weekly - 2 sets - 10 reps - Seated Cervical Retraction  - 1 x daily - 7 x weekly - 2 sets - 10 reps - Seated Gripping Towel  - 1 x daily - 7 x weekly - 2 sets - 10 reps - Wrist Flexion Extension AROM - Palms Down  - 1 x daily - 7 x weekly - 2 sets - 10 reps - Closing and Opening Hand With Shoulder Sling  - 1 x daily - 7 x weekly - 2 sets - 10 reps - Seated Shoulder Pendulum Exercise  - 1 x daily - 7 x weekly - 2 sets - 10 reps - Supine Shoulder Flexion AAROM  - 2 x daily - 7 x weekly - 2 sets - 10 reps - Prone Shoulder Row  - 2 x daily - 7 x weekly - 2 sets - 10 reps - Standing Isometric Shoulder External Rotation with Doorway  - 1 x daily - 7 x weekly - 1 sets - 5 reps - 5 hold - Standing Isometric Shoulder Abduction with Doorway - Arm Bent  - 1 x daily - 7 x weekly - 1 sets - 5 reps - 5 hold - Standing Isometric Shoulder Flexion with Doorway - Arm  Bent  - 1 x daily - 7 x weekly - 1 sets - 5 reps - 5 hold - Standing Isometric Shoulder Extension with Doorway - Arm Bent  - 1 x daily - 7 x weekly - 1 sets - 5 reps - 5 hold - Shoulder Flexion Wall Slide with Towel  - 2-3 x daily - 7 x weekly - 2 sets - 10 reps - Seated Shoulder Flexion AAROM with Pulley Behind  - 1 x daily - 7 x weekly - 1 sets - 60-120 reps - Seated Shoulder Scaption AAROM with Pulley at Side  - 1 x daily - 7 x weekly - 1 sets - 60-120 reps ASSESSMENT:  CLINICAL IMPRESSION: Caroline Gonzales is progressing appropriately.  She was able to do several active exercises in prone, side lying and supine. She has good return of ROM but impingement type pain at around 120 degrees, likely due to the SAD.  She was a little tight on IR but tolerated stretching well.  Encouraged uses of ice and ok to use arm but without any weight.  Cautioned against loading biceps and making it sore.  (Demonstrated activities that will irritate this).  Ended session with ice to avoid post session pain.   Eval: Patient is a 48 y.o. female who was seen today for physical therapy evaluation and treatment 1 week s/p right shoulder scope with rotator cuff repair, biceps tenodesis and subacromial decompression.  The patient would benefit from skilled PT to address shoulder range of motion  limitations while following surgical precautions. When appropriate will progress from passive ROM to active assisted to active ROM and progressive strengthening.  Her current impairments and pain affect ability to perform basic ADLs including personal hygiene, dressing and sleeping and she is unable to work at this time.   OBJECTIVE IMPAIRMENTS: decreased activity tolerance, decreased ROM, decreased strength, increased edema, impaired perceived functional ability, impaired UE functional use, and pain.   ACTIVITY LIMITATIONS: carrying, lifting, sleeping, bed mobility, bathing, toileting, dressing, reach over head, and  hygiene/grooming  PARTICIPATION LIMITATIONS: meal prep, cleaning, laundry, driving, shopping, community activity, and occupation  PERSONAL FACTORS: Time since onset of injury/illness/exacerbation are also affecting patient's functional outcome.   REHAB POTENTIAL: Good  CLINICAL DECISION MAKING: Stable/uncomplicated  EVALUATION COMPLEXITY: Low   GOALS: Goals reviewed with patient? Yes  SHORT TERM GOALS: Target date: 08/04/2024     The patient will demonstrate knowledge of basic self care strategies and exercises to promote healing  Baseline: Goal status: met 11/17  2.  The patient will report a 40% improvement in pain levels with functional activities which are currently difficult including sleeping, dressing, bathing Baseline:  Goal status: ONGOING, SLEEPING IN RECLINER 11/17  3.  Passive elevation to 150 degrees needed for overhead reaching Baseline:  Goal status: met 11/17  4.  Quick DASH functional outcome score improved to 70% indicating improved function with less pain Baseline:  Goal status: INITIAL     LONG TERM GOALS: Target date: 09/15/2024    The patient will be independent in a safe self progression of a home exercise program to promote further recovery of function  Baseline:  Goal status: INITIAL  2.  The patient will report a 75% improvement in pain levels with functional activities which are currently difficult including sleeping, grooming/dressing, return to work Baseline:  Goal status: INITIAL  3.  The patient will have improved active shoulder elevation ROM to at least 130 degrees needed for grooming/dressing purposes as well as reaching high shelves  Baseline:  Goal status: INITIAL  4.  The patient will be have shoulder external and internal rotation to 40 degrees for dressing Baseline:  Goal status: INITIAL  5.  The patient will have grossly 4/5 strength needed to lift and lower a 2# object from a high shelf  Baseline:  Goal status:  INITIAL  6.  Quick DASH functional outcome score improved to 60% indicating improved function with less pain Baseline:  Goal status: INITIAL  PLAN:  PT FREQUENCY: 2x/week  PT DURATION: 12 weeks  PLANNED INTERVENTIONS: 97164- PT Re-evaluation, 97750- Physical Performance Testing, 97110-Therapeutic exercises, 97530- Therapeutic activity, [id number]- Neuromuscular re-education, 97535- Self Care, [id number]- Manual therapy, [id number]- Aquatic Therapy, [id number]- Electrical stimulation (unattended), [id number]- Electrical stimulation (manual), 97016- Vasopneumatic device, Patient/Family education, Taping, Joint mobilization, Cryotherapy, and Moist heat  PLAN FOR NEXT SESSION: active assisted ROM; scapular stabilization in prone glenohumeral and scapular mobilization; cervical ROM, wrist and hand ROM    Macintyre Alexa B. Vennessa Affinito, PT 08/14/24 5:02 PM Phone: [phone] Fax: [phone] [facility] [address] [place], [place] [address] Phone # [phone] Fax [phone]

## 2024-08-16 ENCOUNTER — Encounter: Payer: Self-pay | Admitting: Physical Therapy

## 2024-08-16 ENCOUNTER — Ambulatory Visit: Admitting: Physical Therapy

## 2024-08-16 DIAGNOSIS — M25611 Stiffness of right shoulder, not elsewhere classified: Secondary | ICD-10-CM

## 2024-08-16 DIAGNOSIS — M25511 Pain in right shoulder: Secondary | ICD-10-CM

## 2024-08-16 DIAGNOSIS — F4310 Post-traumatic stress disorder, unspecified: Secondary | ICD-10-CM | POA: Diagnosis not present

## 2024-08-16 DIAGNOSIS — M6281 Muscle weakness (generalized): Secondary | ICD-10-CM

## 2024-08-16 NOTE — Therapy (Signed)
 OUTPATIENT PHYSICAL THERAPY SHOULDER TREATMENT   Patient Name: Caroline Gonzales MRN: [id number] DOB:Jul 23, 1976, 48 y.o., female Today's Date: 08/16/2024  END OF SESSION:  PT End of Session - 08/16/24 1517     Visit Number 14    Date for Recertification  09/15/24    Authorization Type [employer]    PT Start Time 1520    PT Stop Time 1605    PT Time Calculation (min) 45 min    Activity Tolerance Patient tolerated treatment well               Past Medical History:  Diagnosis Date   Acute costochondritis 11/14/2021   Allergy    Zyrtec .   Anxiety    followed by Dr. Melba   Back pain    Complication of anesthesia    PONV   Depression    Elevated blood pressure reading without diagnosis of hypertension 12/29/2021   Fibroid    Gastroparesis 09/14/2012   gastric emptying study in 2014   GERD (gastroesophageal reflux disease)    Headache    Hypertriglyceridemia 10/07/2017   Nightmares 07/21/2018   Pneumonia    2013ish   PONV (postoperative nausea and vomiting)     likes scopolamine  patch and zofran  /phenergan  helps   Pre-diabetes    PTSD (post-traumatic stress disorder)    Residual foreign body in soft tissue 11/12/2016   Screening for colon cancer 07/02/2022   Sleep apnea 07/01/2018   cpap optional, pt close to not use cpap   Vitamin D  deficiency    Wears glasses    Past Surgical History:  Procedure Laterality Date   ABDOMINAL HYSTERECTOMY  01/2021   ANKLE ARTHROSCOPY Left 2011   BICEPT TENODESIS Right 06/16/2024   Procedure: TENODESIS, BICEPS;  Surgeon: Sharl Selinda Dover, MD;  Location: [facility];  Service: Orthopedics;  Laterality: Right;   CESAREAN SECTION  06/2006   x 1   CHOLECYSTECTOMY  07/2006   laparoscopic   CYSTOSCOPY N/A 02/04/2021   Procedure: CYSTOSCOPY;  Surgeon: Jannis Kate Norris, MD;  Location: [facility] OR;  Service: Gynecology;  Laterality: N/A;   DILATION AND CURETTAGE OF UTERUS  1997   x 2   FOREIGN BODY REMOVAL Left  11/18/2016   Procedure: REMOVAL FOREIGN BODY EXTREMITY LEFT FOOT;  Surgeon: Donnice JONELLE Fees, DPM;  Location: [facility] OR;  Service: Podiatry;  Laterality: Left;   GASTRIC ROUX-EN-Y N/A 04/13/2023   Procedure: LAPAROSCOPIC ROUX-EN-Y GASTRIC BYPASS WITH UPPER ENDOSCOPY;  Surgeon: Signe Mitzie LABOR, MD;  Location: [facility];  Service: General;  Laterality: N/A;   KNEE ARTHROSCOPY WITH MEDIAL MENISECTOMY Right 09/10/2021   Procedure: KNEE ARTHROSCOPY WITH MEDIAL MENISCAL ROOT REPAIR;  Surgeon: Cristy Bonner DASEN, MD;  Location: [facility];  Service: Orthopedics;  Laterality: Right;   PILONIDAL CYST EXCISION  1990's   RADIOLOGY WITH ANESTHESIA N/A 11/10/2018   Procedure: MRI WITH ANESTHESIA OF BRAIN AND ORBITS WITH AND WITHOUT CONTRAST;  Surgeon: Radiologist, Medication, MD;  Location: [facility] OR;  Service: Radiology;  Laterality: N/A;   SHOULDER ARTHROSCOPY WITH ROTATOR CUFF REPAIR Right 06/16/2024   Procedure: ARTHROSCOPY, SHOULDER, WITH ROTATOR CUFF REPAIR;  Surgeon: Sharl Selinda Dover, MD;  Location: [facility];  Service: Orthopedics;  Laterality: Right;   SUBACROMIAL DECOMPRESSION Right 06/16/2024   Procedure: DECOMPRESSION, SUBACROMIAL SPACE;  Surgeon: Sharl Selinda Dover, MD;  Location: [facility];  Service: Orthopedics;  Laterality: Right;  Right shoulder arthroscopy with rotator cuff repair, biceps tenodesis, subacromial decompression, distal  clavicle excision   TENDON REPAIR Left 2011   Left Ankle   TOTAL LAPAROSCOPIC HYSTERECTOMY WITH SALPINGECTOMY Bilateral 02/04/2021   Procedure: TOTAL LAPAROSCOPIC HYSTERECTOMY WITH SALPINGECTOMY;  Surgeon: Jannis Kate Norris, MD;  Location: [facility] OR;  Service: Gynecology;  Laterality: Bilateral;   UPPER GASTROINTESTINAL ENDOSCOPY     WISDOM TOOTH EXTRACTION  1990's   Patient Active Problem List   Diagnosis Date Noted   History of nocturnal hypoglycemia 03/02/2024   Encounter for annual physical exam 03/02/2024   Family  history of elevated lipoprotein (a) 04/26/2023   PVC's (premature ventricular contractions) 04/26/2023   Morbid obesity (HCC) 04/13/2023   Gastroesophageal reflux disease 12/24/2022   Abnormal laboratory test 12/24/2022   Chest wall pain 12/24/2022   Change in facial mole 12/29/2021   S/P laparoscopic hysterectomy 02/04/2021   PTSD (post-traumatic stress disorder) 07/21/2018   GAD (generalized anxiety disorder) 07/21/2018   Insomnia 07/21/2018   Recurrent major depressive disorder, in partial remission 07/21/2018   Insulin  resistance 10/21/2017   Hypertriglyceridemia 10/07/2017   Vitamin D  deficiency 10/07/2017   Pre-diabetes 11/02/2016   Morbid obesity with body mass index (BMI) of 50.0 to 59.9 in adult (HCC) 10/15/2016    PCP: Oris Credit NP  REFERRING PROVIDER: Sharl Mayo MD  REFERRING DIAG: s/p right shoulder scope with rotator cuff repair, biceps tenodesis and subacromial decompression  THERAPY DIAG: right shoulder pain; right shoulder stiffness; weakness   Rationale for Evaluation and Treatment: Rehabilitation  ONSET DATE: Spring 2025  SUBJECTIVE:                                                                                                                                                                                      SUBJECTIVE STATEMENT: This week has been a bad week.  I've been in so much pain.  The pain is in the biceps area.  I'm back on pain meds.  Back to sleeping in the recliner (was in the bed recently)     using the ice machine several times a day - my husband helps me set it up.   Hand dominance: Right  Date of surgery 06/16/2024 s/p right shoulder scope with rotator cuff repair, biceps tenodesis and subacromial decompression Pt reports that MD advised her that she could sleep with sling off with pillow support and that she may have her arm straight with support A/ROM to begin at 6 weeks  PERTINENT HISTORY: Right shoulder scope with rotator  cuff repair, biceps tenodesis, and subacromial decompression on 06/16/24 Knee scope 12/29/23 Can't take NSAIDS b/c of gastric bypass  PAIN:   Are you having pain? Yes NPRS scale: 8/10  right upper arm  Pain location: right elbow - achy comes and goes - located at lateral epicondyle, right shoulder achy Pain orientation: Right  PAIN TYPE: aching Pain description: constant  Aggravating factors: post surgical pain mostly well managed Relieving factors: medication   PRECAUTIONS: Shoulder surgical   WEIGHT BEARING RESTRICTIONS: No  FALLS:  Has patient fallen in last 6 months? 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  PLOF: Independent  PATIENT GOALS:baseline be 75% ; shopping, goes to [employer] stretching class in the water   NEXT MD VISIT:   OBJECTIVE:  Note: Objective measures were completed at Evaluation unless otherwise noted.  DIAGNOSTIC FINDINGS:  Full thickness tear of subscapularis and supraspinatus and biceps tear  PATIENT SURVEYS:  Quick Dash:  QUICK DASH  Please rate your ability do the following activities in the last week by selecting the number below the appropriate response.   Activities Rating  Open a tight or new jar.  5 = Unable  Do heavy household chores (e.g., wash walls, floors). 5 = Unable  Carry a shopping bag or briefcase 5 = Unable  Wash your back. 5 = Unable  Use a knife to cut food. 5 = Unable  Recreational activities in which you take some force or impact through your arm, shoulder or hand (e.g., golf, hammering, tennis, etc.). 5 = Unable  During the past week, to what extent has your arm, shoulder or hand problem interfered with your normal social activities with family, friends, neighbors or groups?  4 = Quite a bit  During the past week, were you limited in your work or other regular daily  activities as a result of your arm, shoulder or hand problem? 5 = Unable  Rate the severity of the following symptoms in the last week: Arm, Shoulder, or hand pain. 4 = Severe  Rate the severity of the following symptoms in the last week: Tingling (pins and needles) in your arm, shoulder or hand. 1 = none  During the past week, how much difficulty have you had sleeping because of the pain in your arm, shoulder or hand?  3 = Moderate difficulty   (A QuickDASH score may not be calculated if there is greater than 1 missing item.)  Quick Dash Disability/Symptom Score: 81.8%   Minimally Clinically Important Difference (MCID): 15-20 points  Flavio, F. et al. (2013). Minimally clinically important difference of the disabilities of the arm, shoulder, and hand outcome measures (DASH) and its shortened version (Quick DASH). Journal of Orthopaedic & Sports Physical Therapy, 44(1), 30-39)   COGNITION: Overall cognitive status: Within functional limits for tasks assessed     SENSATION: Denies numbness/tingling  APPEARANCE:  3 portal incisions with steri strips; no redness; no drainage; amount of edema consistent with 1 week post op    UPPER EXTREMITY ROM:  Passive ROM Right eval   Shoulder flexion 15   Shoulder extension    Shoulder abduction 10   Shoulder adduction    Shoulder extension    Shoulder internal rotation 45   Shoulder external rotation 0   Elbow flexion    Elbow extension    Wrist flexion    Wrist extension    Wrist ulnar deviation    Wrist radial deviation    Wrist pronation    Wrist supination     (Blank rows = not tested)   Active ROM Right eval Left  eval  Shoulder flexion Not tested secondary to surgical precautions WFLS  Shoulder extension    Shoulder abduction  WFLS  Shoulder adduction    Shoulder internal rotation  WFLs  Shoulder external rotation  WFLs  Elbow flexion    Elbow extension    Wrist flexion    Wrist extension    Wrist ulnar deviation     Wrist radial deviation    Wrist pronation    Wrist supination    (Blank rows = not tested)  UPPER EXTREMITY MMT:  MMT Right eval Left eval  Shoulder flexion Not tested secondary to surgical precautions 5  Shoulder extension  5  Shoulder abduction  5  Shoulder adduction    Shoulder internal rotation  5  Shoulder external rotation  5  Middle trapezius    Lower trapezius    Elbow flexion  5  Elbow extension  5  Wrist flexion  5  Wrist extension  5  Wrist ulnar deviation    Wrist radial deviation    Wrist pronation    Wrist supination    Grip strength (lbs)    (Blank rows = not tested)                                                                                                                              TREATMENT DATE:  08/16/24 Attempted seated UE Ranger ROM  but too painful ES IFC 10.5 ma with electrodes front of shoulder, back of shoulder, superior shoulder 25 min concurrent with exercise: Supine holds at 90 degrees: small circles each way  Supine serratus punch 10x Prone shoulder extension to tolerance x 10  Prone rows  x 10  Prone circles each way 10x each Prone red ball rolls forward and back 10x Side lying ER  x 10 Attempted active elbow flexion but too painful so discontinued Seated isometrics 50% effort: flexion, extension, abduction, external rotation 5 sec holds 5x each Info given on TENS unit pt considering ordering for home use KT tape: 3 strips 50% stretch each; 1st strip distal and proximal anterior shoulder along biceps, 2nd strip posterior deltoids, 3rd strip horizontal mid biceps Pt instructed to monitor skin, she does have mild redness from 1 of the electrodes; instructed to remove tape in 2 days or sooner if there is irritation  08/14/24 UBE x 6 all fwd Prone shoulder extension to tolerance 2 x 10 with 0# Prone rows 2 x 10 with 0# Prone horizontal abduction 2 x 10 with 0# Side lying ER 0# 2 x 10 Supine serratus punch 0# 2 x 10 Supine  shoulder alphabet A-Z x1 PROM all planes of motion Ice to right shoulder x 10 min in supine hook lying  08/03/24 Recumbent bike L2x5 PT present to discuss status Prone row to neutral with Rt UE off mat table 2x10 Supine AA/ROM Rt UE flexion painfree range (approx 150 deg) 2x12 - Pt holds wrist Dowel Rt shoulder ER to 30  deg supine 2x10 Supine isometrics Rt shoulder 5x5 - elbow into table for ext, PT provided resistance for ER, abd Standing finger climb Rt UE and wall slide - Pt prefers wall slide - less soreness Seated shoulder pulleys 2' flexion and scaption Seated scapular squeeze 10x5  07/31/24 Discussion about next phase of protocol - AA/ROM and weaning brace Supine AA/ROM Rt shoulder flexion painfree range (about 150 deg) - holding Rt wrist with Lt hand x10  Tried dowel supine flexion but pain and not enough support of Rt UE at this time Prone Rt arm row to neutral x10 Seated Rt shoulder flexion slides along table - tolerated about 8 reps before sore  Standing shoulder isometrics 5x5 each: flexion, extension, ER, abd Seated ranger with elbow at 90 deg - 10x circles each way HEP update while Pt on recumbent bike L2 x 8'   PATIENT EDUCATION: Education details: BERDINE, Educated patient on anatomy and physiology of current symptoms, prognosis, plan of care as well as initial self care strategies to promote recovery  Person educated: Patient Education method: Explanation Education comprehension: verbalized understanding  HOME EXERCISE PROGRAM: Access Code: [email or web address] URL: [email or web address] Date: 08/03/2024 Prepared by: Orvil Beuhring  Exercises - Supine Cervical Retraction with Towel  - 2 x daily - 7 x weekly - 1 sets - 10 reps - 10 hold - Supine Cervical Rotation AROM on Pillow  - 2 x daily - 7 x weekly - 1 sets - 5 reps - 10 hold - Seated Scapular Retraction  - 2 x daily - 7 x weekly - 1 sets - 10 reps - Seated Cervical Rotation AROM  - 1 x daily - 7  x weekly - 2 sets - 10 reps - Seated Cervical Sidebending AROM  - 1 x daily - 7 x weekly - 2 sets - 10 reps - Seated Cervical Retraction  - 1 x daily - 7 x weekly - 2 sets - 10 reps - Seated Gripping Towel  - 1 x daily - 7 x weekly - 2 sets - 10 reps - Wrist Flexion Extension AROM - Palms Down  - 1 x daily - 7 x weekly - 2 sets - 10 reps - Closing and Opening Hand With Shoulder Sling  - 1 x daily - 7 x weekly - 2 sets - 10 reps - Seated Shoulder Pendulum Exercise  - 1 x daily - 7 x weekly - 2 sets - 10 reps - Supine Shoulder Flexion AAROM  - 2 x daily - 7 x weekly - 2 sets - 10 reps - Prone Shoulder Row  - 2 x daily - 7 x weekly - 2 sets - 10 reps - Standing Isometric Shoulder External Rotation with Doorway  - 1 x daily - 7 x weekly - 1 sets - 5 reps - 5 hold - Standing Isometric Shoulder Abduction with Doorway - Arm Bent  - 1 x daily - 7 x weekly - 1 sets - 5 reps - 5 hold - Standing Isometric Shoulder Flexion with Doorway - Arm Bent  - 1 x daily - 7 x weekly - 1 sets - 5 reps - 5 hold - Standing Isometric Shoulder Extension with Doorway - Arm Bent  - 1 x daily - 7 x weekly - 1 sets - 5 reps - 5 hold - Shoulder Flexion Wall Slide with Towel  - 2-3 x daily - 7 x weekly - 2 sets - 10 reps - Seated Shoulder Flexion AAROM with Pulley Behind  -  1 x daily - 7 x weekly - 1 sets - 60-120 reps - Seated Shoulder Scaption AAROM with Pulley at Side  - 1 x daily - 7 x weekly - 1 sets - 60-120 reps ASSESSMENT:  CLINICAL IMPRESSION: Jodilyn has had an exacerbation of pain this week and is tearful on arrival but she has excellent pain relief with ES IFC and is able to tolerate almost all ex's with reports of no pain.  She does have pain with active elbow flexion so this was discontinued.  KT tape also applied for pain control with instructions given for monitoring her skin (she also has the tendency to have skin redness from electrodes). Therapist monitoring response to all interventions and modifying treatment  accordingly.      Eval: Patient is a 48 y.o. female who was seen today for physical therapy evaluation and treatment 1 week s/p right shoulder scope with rotator cuff repair, biceps tenodesis and subacromial decompression.  The patient would benefit from skilled PT to address shoulder range of motion limitations while following surgical precautions. When appropriate will progress from passive ROM to active assisted to active ROM and progressive strengthening.  Her current impairments and pain affect ability to perform basic ADLs including personal hygiene, dressing and sleeping and she is unable to work at this time.   OBJECTIVE IMPAIRMENTS: decreased activity tolerance, decreased ROM, decreased strength, increased edema, impaired perceived functional ability, impaired UE functional use, and pain.   ACTIVITY LIMITATIONS: carrying, lifting, sleeping, bed mobility, bathing, toileting, dressing, reach over head, and hygiene/grooming  PARTICIPATION LIMITATIONS: meal prep, cleaning, laundry, driving, shopping, community activity, and occupation  PERSONAL FACTORS: Time since onset of injury/illness/exacerbation are also affecting patient's functional outcome.   REHAB POTENTIAL: Good  CLINICAL DECISION MAKING: Stable/uncomplicated  EVALUATION COMPLEXITY: Low   GOALS: Goals reviewed with patient? Yes  SHORT TERM GOALS: Target date: 08/04/2024     The patient will demonstrate knowledge of basic self care strategies and exercises to promote healing  Baseline: Goal status: met 11/17  2.  The patient will report a 40% improvement in pain levels with functional activities which are currently difficult including sleeping, dressing, bathing Baseline:  Goal status: ONGOING, SLEEPING IN RECLINER 11/17  3.  Passive elevation to 150 degrees needed for overhead reaching Baseline:  Goal status: met 11/17  4.  Quick DASH functional outcome score improved to 70% indicating improved function with  less pain Baseline:  Goal status: INITIAL     LONG TERM GOALS: Target date: 09/15/2024    The patient will be independent in a safe self progression of a home exercise program to promote further recovery of function  Baseline:  Goal status: INITIAL  2.  The patient will report a 75% improvement in pain levels with functional activities which are currently difficult including sleeping, grooming/dressing, return to work Baseline:  Goal status: INITIAL  3.  The patient will have improved active shoulder elevation ROM to at least 130 degrees needed for grooming/dressing purposes as well as reaching high shelves  Baseline:  Goal status: INITIAL  4.  The patient will be have shoulder external and internal rotation to 40 degrees for dressing Baseline:  Goal status: INITIAL  5.  The patient will have grossly 4/5 strength needed to lift and lower a 2# object from a high shelf  Baseline:  Goal status: INITIAL  6.  Quick DASH functional outcome score improved to 60% indicating improved function with less pain Baseline:  Goal status: INITIAL  PLAN:  PT FREQUENCY: 2x/week  PT DURATION: 12 weeks  PLANNED INTERVENTIONS: 97164- PT Re-evaluation, 97750- Physical Performance Testing, 97110-Therapeutic exercises, 97530- Therapeutic activity, 97112- Neuromuscular re-education, 97535- Self Care, 02859- Manual therapy, [phone]- Aquatic Therapy, [phone]- Electrical stimulation (unattended), [phone]- Electrical stimulation (manual), 97016- Vasopneumatic device, Patient/Family education, Taping, Joint mobilization, Cryotherapy, and Moist heat  PLAN FOR NEXT SESSION: do Quick DASH for STGs; see if pt got TENs unit and check response to KT; may need a slower progression of active assisted to active ROM secondary to pain; scapular stabilization glenohumeral and scapular mobilization  Glade Pesa, PT 08/16/24 4:26 PM Phone: [phone] Fax: [phone]  [facility] [address] [place], [place] [address] Phone # [phone] Fax [phone]

## 2024-08-16 NOTE — Patient Instructions (Signed)

## 2024-08-17 ENCOUNTER — Ambulatory Visit: Payer: 59 | Admitting: Dermatology

## 2024-08-21 ENCOUNTER — Ambulatory Visit

## 2024-08-21 ENCOUNTER — Other Ambulatory Visit (HOSPITAL_COMMUNITY): Payer: Self-pay

## 2024-08-21 ENCOUNTER — Encounter: Payer: Self-pay | Admitting: Physical Therapy

## 2024-08-21 DIAGNOSIS — M25511 Pain in right shoulder: Secondary | ICD-10-CM | POA: Diagnosis not present

## 2024-08-21 DIAGNOSIS — M6281 Muscle weakness (generalized): Secondary | ICD-10-CM

## 2024-08-21 DIAGNOSIS — M25611 Stiffness of right shoulder, not elsewhere classified: Secondary | ICD-10-CM

## 2024-08-21 MED ORDER — HYDROCODONE-ACETAMINOPHEN 5-325 MG PO TABS
1.0000 | ORAL_TABLET | Freq: Four times a day (QID) | ORAL | 0 refills | Status: AC | PRN
  Filled 2024-08-21: qty 20, 5d supply, fill #0

## 2024-08-21 NOTE — Therapy (Signed)
 OUTPATIENT PHYSICAL THERAPY SHOULDER TREATMENT   Patient Name: Caroline Gonzales MRN: [id number] DOB:1975/10/22, 48 y.o., female Today's Date: 08/21/2024  END OF SESSION:  PT End of Session - 08/21/24 1611     Visit Number 15    Date for Recertification  09/15/24    Authorization Type [employer]    PT Start Time 1521    PT Stop Time 1608    PT Time Calculation (min) 47 min    Activity Tolerance Patient tolerated treatment well    Behavior During Therapy [facility] for tasks assessed/performed                Past Medical History:  Diagnosis Date   Acute costochondritis 11/14/2021   Allergy    Zyrtec .   Anxiety    followed by Dr. Melba   Back pain    Complication of anesthesia    PONV   Depression    Elevated blood pressure reading without diagnosis of hypertension 12/29/2021   Fibroid    Gastroparesis 09/14/2012   gastric emptying study in 2014   GERD (gastroesophageal reflux disease)    Headache    Hypertriglyceridemia 10/07/2017   Nightmares 07/21/2018   Pneumonia    2013ish   PONV (postoperative nausea and vomiting)     likes scopolamine  patch and zofran  /phenergan  helps   Pre-diabetes    PTSD (post-traumatic stress disorder)    Residual foreign body in soft tissue 11/12/2016   Screening for colon cancer 07/02/2022   Sleep apnea 07/01/2018   cpap optional, pt close to not use cpap   Vitamin D  deficiency    Wears glasses    Past Surgical History:  Procedure Laterality Date   ABDOMINAL HYSTERECTOMY  01/2021   ANKLE ARTHROSCOPY Left 2011   BICEPT TENODESIS Right 06/16/2024   Procedure: TENODESIS, BICEPS;  Surgeon: Sharl Selinda Dover, MD;  Location: [facility];  Service: Orthopedics;  Laterality: Right;   CESAREAN SECTION  06/2006   x 1   CHOLECYSTECTOMY  07/2006   laparoscopic   CYSTOSCOPY N/A 02/04/2021   Procedure: CYSTOSCOPY;  Surgeon: Jannis Kate Norris, MD;  Location: [facility] OR;  Service: Gynecology;  Laterality: N/A;   DILATION AND  CURETTAGE OF UTERUS  1997   x 2   FOREIGN BODY REMOVAL Left 11/18/2016   Procedure: REMOVAL FOREIGN BODY EXTREMITY LEFT FOOT;  Surgeon: Donnice JONELLE Fees, DPM;  Location: [facility] OR;  Service: Podiatry;  Laterality: Left;   GASTRIC ROUX-EN-Y N/A 04/13/2023   Procedure: LAPAROSCOPIC ROUX-EN-Y GASTRIC BYPASS WITH UPPER ENDOSCOPY;  Surgeon: Signe Mitzie LABOR, MD;  Location: [facility];  Service: General;  Laterality: N/A;   KNEE ARTHROSCOPY WITH MEDIAL MENISECTOMY Right 09/10/2021   Procedure: KNEE ARTHROSCOPY WITH MEDIAL MENISCAL ROOT REPAIR;  Surgeon: Cristy Bonner DASEN, MD;  Location: [facility];  Service: Orthopedics;  Laterality: Right;   PILONIDAL CYST EXCISION  1990's   RADIOLOGY WITH ANESTHESIA N/A 11/10/2018   Procedure: MRI WITH ANESTHESIA OF BRAIN AND ORBITS WITH AND WITHOUT CONTRAST;  Surgeon: Radiologist, Medication, MD;  Location: [facility] OR;  Service: Radiology;  Laterality: N/A;   SHOULDER ARTHROSCOPY WITH ROTATOR CUFF REPAIR Right 06/16/2024   Procedure: ARTHROSCOPY, SHOULDER, WITH ROTATOR CUFF REPAIR;  Surgeon: Sharl Selinda Dover, MD;  Location: [facility];  Service: Orthopedics;  Laterality: Right;   SUBACROMIAL DECOMPRESSION Right 06/16/2024   Procedure: DECOMPRESSION, SUBACROMIAL SPACE;  Surgeon: Sharl Selinda Dover, MD;  Location:  SURGERY CENTER;  Service: Orthopedics;  Laterality: Right;  Right  shoulder arthroscopy with rotator cuff repair, biceps tenodesis, subacromial decompression, distal clavicle excision   TENDON REPAIR Left 2011   Left Ankle   TOTAL LAPAROSCOPIC HYSTERECTOMY WITH SALPINGECTOMY Bilateral 02/04/2021   Procedure: TOTAL LAPAROSCOPIC HYSTERECTOMY WITH SALPINGECTOMY;  Surgeon: Jannis Kate Norris, MD;  Location: [facility] OR;  Service: Gynecology;  Laterality: Bilateral;   UPPER GASTROINTESTINAL ENDOSCOPY     WISDOM TOOTH EXTRACTION  1990's   Patient Active Problem List   Diagnosis Date Noted   History of nocturnal hypoglycemia  03/02/2024   Encounter for annual physical exam 03/02/2024   Family history of elevated lipoprotein (a) 04/26/2023   PVC's (premature ventricular contractions) 04/26/2023   Morbid obesity (HCC) 04/13/2023   Gastroesophageal reflux disease 12/24/2022   Abnormal laboratory test 12/24/2022   Chest wall pain 12/24/2022   Change in facial mole 12/29/2021   S/P laparoscopic hysterectomy 02/04/2021   PTSD (post-traumatic stress disorder) 07/21/2018   GAD (generalized anxiety disorder) 07/21/2018   Insomnia 07/21/2018   Recurrent major depressive disorder, in partial remission 07/21/2018   Insulin  resistance 10/21/2017   Hypertriglyceridemia 10/07/2017   Vitamin D  deficiency 10/07/2017   Pre-diabetes 11/02/2016   Morbid obesity with body mass index (BMI) of 50.0 to 59.9 in adult (HCC) 10/15/2016    PCP: Oris Credit NP  REFERRING PROVIDER: Sharl Mayo MD  REFERRING DIAG: s/p right shoulder scope with rotator cuff repair, biceps tenodesis and subacromial decompression  THERAPY DIAG: right shoulder pain; right shoulder stiffness; weakness   Rationale for Evaluation and Treatment: Rehabilitation  ONSET DATE: Spring 2025  SUBJECTIVE:                                                                                                                                                                                      SUBJECTIVE STATEMENT: Patient reports she is doing fine today. No pain. She did not notice a big difference with KT tape.   using the ice machine several times a day - my husband helps me set it up.   Hand dominance: Right  Date of surgery 06/16/2024 s/p right shoulder scope with rotator cuff repair, biceps tenodesis and subacromial decompression Pt reports that MD advised her that she could sleep with sling off with pillow support and that she may have her arm straight with support A/ROM to begin at 6 weeks  PERTINENT HISTORY: Right shoulder scope with rotator cuff  repair, biceps tenodesis, and subacromial decompression on 06/16/24 Knee scope 12/29/23 Can't take NSAIDS b/c of gastric bypass  PAIN:   Are you having pain? Yes NPRS scale: 8/10  right upper arm  Pain location: right elbow - achy comes and goes - located at  lateral epicondyle, right shoulder achy Pain orientation: Right  PAIN TYPE: aching Pain description: constant  Aggravating factors: post surgical pain mostly well managed Relieving factors: medication   PRECAUTIONS: Shoulder surgical   WEIGHT BEARING RESTRICTIONS: No  FALLS:  Has patient fallen in last 6 months? 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  PLOF: Independent  PATIENT GOALS:baseline be 75% ; shopping, goes to [employer] stretching class in the [place]   NEXT MD VISIT:   OBJECTIVE:  Note: Objective measures were completed at Evaluation unless otherwise noted.  DIAGNOSTIC FINDINGS:  Full thickness tear of subscapularis and supraspinatus and biceps tear  PATIENT SURVEYS:  Quick Dash:  QUICK DASH  Please rate your ability do the following activities in the last week by selecting the number below the appropriate response.   Activities Rating  Open a tight or new jar.  5 = Unable  Do heavy household chores (e.g., wash walls, floors). 5 = Unable  Carry a shopping bag or briefcase 5 = Unable  Wash your back. 5 = Unable  Use a knife to cut food. 5 = Unable  Recreational activities in which you take some force or impact through your arm, shoulder or hand (e.g., golf, hammering, tennis, etc.). 5 = Unable  During the past week, to what extent has your arm, shoulder or hand problem interfered with your normal social activities with family, friends, neighbors or groups?  4 = Quite a bit  During the past week, were you limited in your work or other regular daily activities  as a result of your arm, shoulder or hand problem? 5 = Unable  Rate the severity of the following symptoms in the last week: Arm, Shoulder, or hand pain. 4 = Severe  Rate the severity of the following symptoms in the last week: Tingling (pins and needles) in your arm, shoulder or hand. 1 = none  During the past week, how much difficulty have you had sleeping because of the pain in your arm, shoulder or hand?  3 = Moderate difficulty   (A QuickDASH score may not be calculated if there is greater than 1 missing item.)  Quick Dash Disability/Symptom Score: 81.8%   Minimally Clinically Important Difference (MCID): 15-20 points    08/21/2024 Quick DASH : 38.6/100 38.6%   COGNITION: Overall cognitive status: Within functional limits for tasks assessed     SENSATION: Denies numbness/tingling  APPEARANCE:  3 portal incisions with steri strips; no redness; no drainage; amount of edema consistent with 1 week post op    UPPER EXTREMITY ROM:  Passive ROM Right eval   Shoulder flexion 15   Shoulder extension    Shoulder abduction 10   Shoulder adduction    Shoulder extension    Shoulder internal rotation 45   Shoulder external rotation 0   Elbow flexion    Elbow extension    Wrist flexion    Wrist extension    Wrist ulnar deviation    Wrist radial deviation    Wrist pronation    Wrist supination     (Blank rows = not tested)   Active ROM Right eval Left eval  Shoulder flexion Not tested secondary to surgical precautions WFLS  Shoulder extension    Shoulder abduction  WFLS  Shoulder adduction    Shoulder internal rotation  WFLs  Shoulder external rotation  WFLs  Elbow  flexion    Elbow extension    Wrist flexion    Wrist extension    Wrist ulnar deviation    Wrist radial deviation    Wrist pronation    Wrist supination    (Blank rows = not tested)  UPPER EXTREMITY MMT:  MMT Right eval Left eval  Shoulder flexion Not tested secondary to surgical precautions 5   Shoulder extension  5  Shoulder abduction  5  Shoulder adduction    Shoulder internal rotation  5  Shoulder external rotation  5  Middle trapezius    Lower trapezius    Elbow flexion  5  Elbow extension  5  Wrist flexion  5  Wrist extension  5  Wrist ulnar deviation    Wrist radial deviation    Wrist pronation    Wrist supination    Grip strength (lbs)    (Blank rows = not tested)                                                                                                                              TREATMENT DATE:  08/21/24 Quick DASH see above Red stability ball roll ups at wall x 10  Supine serratus punch 2 x 8 Rt  Supine holds at 90 degrees: small circles each way x 10 each direction Supine shoulder flexion x 10 Rt  Prone rows  2 x 10 Rt  Prone shoulder extension 2 x 10 Rt  Side lying ER 2 x 10 Rt (uncomfortable)  Seated shoulder scaption along plinth 2 x 10 Rt  Seated biceps curls x10 hammer curl x 10 with supination  Seated shoulder pulleys 2' flexion and scaption Seated isometrics 50% effort: flexion,abduction, external rotation, internal rotation  5 sec holds 5x each Push up plus at wall 2 x 10 Plank at wall + shoulder tap (increased soreness) Manual: STM to right biceps and delotoid for decreased muscle spasms and pain.    08/16/24 Attempted seated UE Ranger ROM  but too painful ES IFC 10.5 ma with electrodes front of shoulder, back of shoulder, superior shoulder 25 min concurrent with exercise: Supine holds at 90 degrees: small circles each way  Supine serratus punch 10x Prone shoulder extension to tolerance x 10  Prone rows  x 10  Prone circles each way 10x each Prone red ball rolls forward and back 10x Side lying ER  x 10 Attempted active elbow flexion but too painful so discontinued Seated isometrics 50% effort: flexion, extension, abduction, external rotation 5 sec holds 5x each Info given on TENS unit pt considering ordering for home use KT  tape: 3 strips 50% stretch each; 1st strip distal and proximal anterior shoulder along biceps, 2nd strip posterior deltoids, 3rd strip horizontal mid biceps Pt instructed to monitor skin, she does have mild redness from 1 of the electrodes; instructed to remove tape in 2 days or sooner if there is irritation  08/14/24 UBE x 6 all  fwd Prone shoulder extension to tolerance 2 x 10 with 0# Prone rows 2 x 10 with 0# Prone horizontal abduction 2 x 10 with 0# Side lying ER 0# 2 x 10 Supine serratus punch 0# 2 x 10 Supine shoulder alphabet A-Z x1 PROM all planes of motion Ice to right shoulder x 10 min in supine hook lying  08/03/24 Recumbent bike L2x5 PT present to discuss status Prone row to neutral with Rt UE off mat table 2x10 Supine AA/ROM Rt UE flexion painfree range (approx 150 deg) 2x12 - Pt holds wrist Dowel Rt shoulder ER to 30 deg supine 2x10 Supine isometrics Rt shoulder 5x5 - elbow into table for ext, PT provided resistance for ER, abd Standing finger climb Rt UE and wall slide - Pt prefers wall slide - less soreness Seated shoulder pulleys 2' flexion and scaption Seated scapular squeeze 10x5    PATIENT EDUCATION: Education details: BERDINE, Educated patient on anatomy and physiology of current symptoms, prognosis, plan of care as well as initial self care strategies to promote recovery  Person educated: Patient Education method: Explanation Education comprehension: verbalized understanding  HOME EXERCISE PROGRAM: Access Code: [email or web address] URL: [email or web address] Date: 08/03/2024 Prepared by: Orvil Beuhring  Exercises - Supine Cervical Retraction with Towel  - 2 x daily - 7 x weekly - 1 sets - 10 reps - 10 hold - Supine Cervical Rotation AROM on Pillow  - 2 x daily - 7 x weekly - 1 sets - 5 reps - 10 hold - Seated Scapular Retraction  - 2 x daily - 7 x weekly - 1 sets - 10 reps - Seated Cervical Rotation AROM  - 1 x daily - 7 x weekly - 2 sets - 10  reps - Seated Cervical Sidebending AROM  - 1 x daily - 7 x weekly - 2 sets - 10 reps - Seated Cervical Retraction  - 1 x daily - 7 x weekly - 2 sets - 10 reps - Seated Gripping Towel  - 1 x daily - 7 x weekly - 2 sets - 10 reps - Wrist Flexion Extension AROM - Palms Down  - 1 x daily - 7 x weekly - 2 sets - 10 reps - Closing and Opening Hand With Shoulder Sling  - 1 x daily - 7 x weekly - 2 sets - 10 reps - Seated Shoulder Pendulum Exercise  - 1 x daily - 7 x weekly - 2 sets - 10 reps - Supine Shoulder Flexion AAROM  - 2 x daily - 7 x weekly - 2 sets - 10 reps - Prone Shoulder Row  - 2 x daily - 7 x weekly - 2 sets - 10 reps - Standing Isometric Shoulder External Rotation with Doorway  - 1 x daily - 7 x weekly - 1 sets - 5 reps - 5 hold - Standing Isometric Shoulder Abduction with Doorway - Arm Bent  - 1 x daily - 7 x weekly - 1 sets - 5 reps - 5 hold - Standing Isometric Shoulder Flexion with Doorway - Arm Bent  - 1 x daily - 7 x weekly - 1 sets - 5 reps - 5 hold - Standing Isometric Shoulder Extension with Doorway - Arm Bent  - 1 x daily - 7 x weekly - 1 sets - 5 reps - 5 hold - Shoulder Flexion Wall Slide with Towel  - 2-3 x daily - 7 x weekly - 2 sets - 10 reps - Seated Shoulder Flexion AAROM with Pulley  Behind  - 1 x daily - 7 x weekly - 1 sets - 60-120 reps - Seated Shoulder Scaption AAROM with Pulley at Side  - 1 x daily - 7 x weekly - 1 sets - 60-120 reps ASSESSMENT:  CLINICAL IMPRESSION: Anadia presents with no pain today. She felt good after last treatment session and did not feel any increases in pain. She tolerated periscapular strengthening exercises well today. Incorporated more closed chain stability exercises and she verbalized some soreness with shoulder tap exercise. She is highly compliant with HEP and incorporating exercises. Noted improvements on Quick DASH score since evaluation. She as an MD appointment scheduled next week. Patient will benefit from skilled PT to address  the below impairments and improve overall function.    Eval: Patient is a 48 y.o. female who was seen today for physical therapy evaluation and treatment 1 week s/p right shoulder scope with rotator cuff repair, biceps tenodesis and subacromial decompression.  The patient would benefit from skilled PT to address shoulder range of motion limitations while following surgical precautions. When appropriate will progress from passive ROM to active assisted to active ROM and progressive strengthening.  Her current impairments and pain affect ability to perform basic ADLs including personal hygiene, dressing and sleeping and she is unable to work at this time.   OBJECTIVE IMPAIRMENTS: decreased activity tolerance, decreased ROM, decreased strength, increased edema, impaired perceived functional ability, impaired UE functional use, and pain.   ACTIVITY LIMITATIONS: carrying, lifting, sleeping, bed mobility, bathing, toileting, dressing, reach over head, and hygiene/grooming  PARTICIPATION LIMITATIONS: meal prep, cleaning, laundry, driving, shopping, community activity, and occupation  PERSONAL FACTORS: Time since onset of injury/illness/exacerbation are also affecting patient's functional outcome.   REHAB POTENTIAL: Good  CLINICAL DECISION MAKING: Stable/uncomplicated  EVALUATION COMPLEXITY: Low   GOALS: Goals reviewed with patient? Yes  SHORT TERM GOALS: Target date: 08/04/2024     The patient will demonstrate knowledge of basic self care strategies and exercises to promote healing  Baseline: Goal status: met 11/17  2.  The patient will report a 40% improvement in pain levels with functional activities which are currently difficult including sleeping, dressing, bathing Baseline:  Goal status: ONGOING, SLEEPING IN RECLINER 11/17  3.  Passive elevation to 150 degrees needed for overhead reaching Baseline:  Goal status: met 11/17  4.  Quick DASH functional outcome score improved to 70%  indicating improved function with less pain Baseline:  Goal status: MET (38.6% ) 08/21/2024      LONG TERM GOALS: Target date: 09/15/2024    The patient will be independent in a safe self progression of a home exercise program to promote further recovery of function  Baseline:  Goal status: INITIAL  2.  The patient will report a 75% improvement in pain levels with functional activities which are currently difficult including sleeping, grooming/dressing, return to work Baseline:  Goal status: INITIAL  3.  The patient will have improved active shoulder elevation ROM to at least 130 degrees needed for grooming/dressing purposes as well as reaching high shelves  Baseline:  Goal status: INITIAL  4.  The patient will be have shoulder external and internal rotation to 40 degrees for dressing Baseline:  Goal status: INITIAL  5.  The patient will have grossly 4/5 strength needed to lift and lower a 2# object from a high shelf  Baseline:  Goal status: INITIAL  6.  Quick DASH functional outcome score improved to 60% indicating improved function with less pain Baseline:  Goal status:  MET (38.6% ) 08/21/2024   PLAN:  PT FREQUENCY: 2x/week  PT DURATION: 12 weeks  PLANNED INTERVENTIONS: 02835- PT Re-evaluation, 97750- Physical Performance Testing, 97110-Therapeutic exercises, 97530- Therapeutic activity, 97112- Neuromuscular re-education, 97535- Self Care, 97140- Manual therapy, [phone]- Aquatic Therapy, [phone]- Electrical stimulation (unattended), [phone]- Electrical stimulation (manual), 97016- Vasopneumatic device, Patient/Family education, Taping, Joint mobilization, Cryotherapy, and Moist heat  PLAN FOR NEXT SESSION: assess response to treatment session; progress as tolerated; may need a slower progression of active assisted to active ROM secondary to pain; scapular stabilization glenohumeral and scapular mobilization   Kristeen Sar, PT, DPT 08/21/24 4:12 PM [facility] [address], [address] [place], [place] [address] Phone # [phone] Fax [phone]

## 2024-08-22 ENCOUNTER — Other Ambulatory Visit (HOSPITAL_COMMUNITY): Payer: Self-pay

## 2024-08-22 DIAGNOSIS — F4310 Post-traumatic stress disorder, unspecified: Secondary | ICD-10-CM | POA: Diagnosis not present

## 2024-08-23 ENCOUNTER — Ambulatory Visit: Admitting: Physical Therapy

## 2024-08-23 DIAGNOSIS — M25511 Pain in right shoulder: Secondary | ICD-10-CM

## 2024-08-23 DIAGNOSIS — M6281 Muscle weakness (generalized): Secondary | ICD-10-CM

## 2024-08-23 DIAGNOSIS — M25611 Stiffness of right shoulder, not elsewhere classified: Secondary | ICD-10-CM

## 2024-08-23 DIAGNOSIS — R293 Abnormal posture: Secondary | ICD-10-CM

## 2024-08-23 DIAGNOSIS — R600 Localized edema: Secondary | ICD-10-CM

## 2024-08-23 NOTE — Therapy (Signed)
 OUTPATIENT PHYSICAL THERAPY SHOULDER TREATMENT   Patient Name: Caroline Gonzales MRN: [id number] DOB:Feb 21, 1976, 48 y.o., female Today's Date: 08/23/2024  END OF SESSION:  PT End of Session - 08/23/24 1703     Visit Number 16    Date for Recertification  09/15/24    Authorization Type [employer]    PT Start Time 1619    PT Stop Time 1658    PT Time Calculation (min) 39 min    Activity Tolerance Patient tolerated treatment well    Behavior During Therapy [facility] for tasks assessed/performed                 Past Medical History:  Diagnosis Date   Acute costochondritis 11/14/2021   Allergy    Zyrtec .   Anxiety    followed by Dr. Melba   Back pain    Complication of anesthesia    PONV   Depression    Elevated blood pressure reading without diagnosis of hypertension 12/29/2021   Fibroid    Gastroparesis 09/14/2012   gastric emptying study in 2014   GERD (gastroesophageal reflux disease)    Headache    Hypertriglyceridemia 10/07/2017   Nightmares 07/21/2018   Pneumonia    2013ish   PONV (postoperative nausea and vomiting)     likes scopolamine  patch and zofran  /phenergan  helps   Pre-diabetes    PTSD (post-traumatic stress disorder)    Residual foreign body in soft tissue 11/12/2016   Screening for colon cancer 07/02/2022   Sleep apnea 07/01/2018   cpap optional, pt close to not use cpap   Vitamin D  deficiency    Wears glasses    Past Surgical History:  Procedure Laterality Date   ABDOMINAL HYSTERECTOMY  01/2021   ANKLE ARTHROSCOPY Left 2011   BICEPT TENODESIS Right 06/16/2024   Procedure: TENODESIS, BICEPS;  Surgeon: Sharl Selinda Dover, MD;  Location: [facility];  Service: Orthopedics;  Laterality: Right;   CESAREAN SECTION  06/2006   x 1   CHOLECYSTECTOMY  07/2006   laparoscopic   CYSTOSCOPY N/A 02/04/2021   Procedure: CYSTOSCOPY;  Surgeon: Jannis Kate Norris, MD;  Location: [facility] OR;  Service: Gynecology;  Laterality: N/A;   DILATION AND  CURETTAGE OF UTERUS  1997   x 2   FOREIGN BODY REMOVAL Left 11/18/2016   Procedure: REMOVAL FOREIGN BODY EXTREMITY LEFT FOOT;  Surgeon: Donnice JONELLE Fees, DPM;  Location: [facility] OR;  Service: Podiatry;  Laterality: Left;   GASTRIC ROUX-EN-Y N/A 04/13/2023   Procedure: LAPAROSCOPIC ROUX-EN-Y GASTRIC BYPASS WITH UPPER ENDOSCOPY;  Surgeon: Signe Mitzie LABOR, MD;  Location: [facility]S;  Service: General;  Laterality: N/A;   KNEE ARTHROSCOPY WITH MEDIAL MENISECTOMY Right 09/10/2021   Procedure: KNEE ARTHROSCOPY WITH MEDIAL MENISCAL ROOT REPAIR;  Surgeon: Cristy Bonner DASEN, MD;  Location: [facility];  Service: Orthopedics;  Laterality: Right;   PILONIDAL CYST EXCISION  1990's   RADIOLOGY WITH ANESTHESIA N/A 11/10/2018   Procedure: MRI WITH ANESTHESIA OF BRAIN AND ORBITS WITH AND WITHOUT CONTRAST;  Surgeon: Radiologist, Medication, MD;  Location: [facility] OR;  Service: Radiology;  Laterality: N/A;   SHOULDER ARTHROSCOPY WITH ROTATOR CUFF REPAIR Right 06/16/2024   Procedure: ARTHROSCOPY, SHOULDER, WITH ROTATOR CUFF REPAIR;  Surgeon: Sharl Selinda Dover, MD;  Location: [facility];  Service: Orthopedics;  Laterality: Right;   SUBACROMIAL DECOMPRESSION Right 06/16/2024   Procedure: DECOMPRESSION, SUBACROMIAL SPACE;  Surgeon: Sharl Selinda Dover, MD;  Location:  SURGERY CENTER;  Service: Orthopedics;  Laterality: Right;  Right shoulder arthroscopy with rotator cuff repair, biceps tenodesis, subacromial decompression, distal clavicle excision   TENDON REPAIR Left 2011   Left Ankle   TOTAL LAPAROSCOPIC HYSTERECTOMY WITH SALPINGECTOMY Bilateral 02/04/2021   Procedure: TOTAL LAPAROSCOPIC HYSTERECTOMY WITH SALPINGECTOMY;  Surgeon: Jannis Kate Norris, MD;  Location: [facility] OR;  Service: Gynecology;  Laterality: Bilateral;   UPPER GASTROINTESTINAL ENDOSCOPY     WISDOM TOOTH EXTRACTION  1990's   Patient Active Problem List   Diagnosis Date Noted   History of nocturnal hypoglycemia  03/02/2024   Encounter for annual physical exam 03/02/2024   Family history of elevated lipoprotein (a) 04/26/2023   PVC's (premature ventricular contractions) 04/26/2023   Morbid obesity (HCC) 04/13/2023   Gastroesophageal reflux disease 12/24/2022   Abnormal laboratory test 12/24/2022   Chest wall pain 12/24/2022   Change in facial mole 12/29/2021   S/P laparoscopic hysterectomy 02/04/2021   PTSD (post-traumatic stress disorder) 07/21/2018   GAD (generalized anxiety disorder) 07/21/2018   Insomnia 07/21/2018   Recurrent major depressive disorder, in partial remission 07/21/2018   Insulin  resistance 10/21/2017   Hypertriglyceridemia 10/07/2017   Vitamin D  deficiency 10/07/2017   Pre-diabetes 11/02/2016   Morbid obesity with body mass index (BMI) of 50.0 to 59.9 in adult (HCC) 10/15/2016    PCP: Oris Credit NP  REFERRING PROVIDER: Sharl Mayo MD  REFERRING DIAG: s/p right shoulder scope with rotator cuff repair, biceps tenodesis and subacromial decompression  THERAPY DIAG: right shoulder pain; right shoulder stiffness; weakness   Rationale for Evaluation and Treatment: Rehabilitation  ONSET DATE: Spring 2025  SUBJECTIVE:                                                                                                                                                                                      SUBJECTIVE STATEMENT: Feeling better overall.  Using Rt UE 75% of normal- limited with carrying groceries, lifting and eating with the Rt.    using the ice machine several times a day - my husband helps me set it up.   Hand dominance: Right  Date of surgery 06/16/2024 s/p right shoulder scope with rotator cuff repair, biceps tenodesis and subacromial decompression Pt reports that MD advised her that she could sleep with sling off with pillow support and that she may have her arm straight with support A/ROM to begin at 6 weeks  PERTINENT HISTORY: Right shoulder scope with  rotator cuff repair, biceps tenodesis, and subacromial decompression on 06/16/24 Knee scope 12/29/23 Can't take NSAIDS b/c of gastric bypass  PAIN:  08/23/24 Are you having pain? Yes NPRS scale: 2-7/10  right upper arm  Pain location: right elbow - achy comes and goes -  located at lateral epicondyle, right shoulder achy Pain orientation: Right  PAIN TYPE: aching Pain description: constant  Aggravating factors: repetition,  Relieving factors: medication   PRECAUTIONS: Shoulder surgical   WEIGHT BEARING RESTRICTIONS: No  FALLS:  Has patient fallen in last 6 months? 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  PLOF: Independent  PATIENT GOALS:baseline be 75% ; shopping, goes to [employer] stretching class in the [place]   NEXT MD VISIT:   OBJECTIVE:  Note: Objective measures were completed at Evaluation unless otherwise noted.  DIAGNOSTIC FINDINGS:  Full thickness tear of subscapularis and supraspinatus and biceps tear  PATIENT SURVEYS:  Quick Dash:  QUICK DASH  Please rate your ability do the following activities in the last week by selecting the number below the appropriate response.   Activities Rating  Open a tight or new jar.  5 = Unable  Do heavy household chores (e.g., wash walls, floors). 5 = Unable  Carry a shopping bag or briefcase 5 = Unable  Wash your back. 5 = Unable  Use a knife to cut food. 5 = Unable  Recreational activities in which you take some force or impact through your arm, shoulder or hand (e.g., golf, hammering, tennis, etc.). 5 = Unable  During the past week, to what extent has your arm, shoulder or hand problem interfered with your normal social activities with family, friends, neighbors or groups?  4 = Quite a bit  During the past week, were you limited in your work or other regular daily activities as a  result of your arm, shoulder or hand problem? 5 = Unable  Rate the severity of the following symptoms in the last week: Arm, Shoulder, or hand pain. 4 = Severe  Rate the severity of the following symptoms in the last week: Tingling (pins and needles) in your arm, shoulder or hand. 1 = none  During the past week, how much difficulty have you had sleeping because of the pain in your arm, shoulder or hand?  3 = Moderate difficulty   (A QuickDASH score may not be calculated if there is greater than 1 missing item.)  Quick Dash Disability/Symptom Score: 81.8%   Minimally Clinically Important Difference (MCID): 15-20 points    08/21/2024 Quick DASH : 38.6/100 38.6%   COGNITION: Overall cognitive status: Within functional limits for tasks assessed     SENSATION: Denies numbness/tingling  APPEARANCE:  3 portal incisions with steri strips; no redness; no drainage; amount of edema consistent with 1 week post op    UPPER EXTREMITY ROM:  Passive ROM Right eval   Shoulder flexion 15   Shoulder extension    Shoulder abduction 10   Shoulder adduction    Shoulder extension    Shoulder internal rotation 45   Shoulder external rotation 0   Elbow flexion    Elbow extension    Wrist flexion    Wrist extension    Wrist ulnar deviation    Wrist radial deviation    Wrist pronation    Wrist supination     (Blank rows = not tested)   Active ROM Right eval Left eval  Shoulder flexion Not tested secondary to surgical precautions WFLS  Shoulder extension    Shoulder abduction  WFLS  Shoulder adduction    Shoulder internal rotation  WFLs  Shoulder external rotation  WFLs  Elbow flexion  Elbow extension    Wrist flexion    Wrist extension    Wrist ulnar deviation    Wrist radial deviation    Wrist pronation    Wrist supination    (Blank rows = not tested)  UPPER EXTREMITY MMT:  MMT Right eval Left eval  Shoulder flexion Not tested secondary to surgical precautions 5  Shoulder  extension  5  Shoulder abduction  5  Shoulder adduction    Shoulder internal rotation  5  Shoulder external rotation  5  Middle trapezius    Lower trapezius    Elbow flexion  5  Elbow extension  5  Wrist flexion  5  Wrist extension  5  Wrist ulnar deviation    Wrist radial deviation    Wrist pronation    Wrist supination    Grip strength (lbs)    (Blank rows = not tested)                                                                                                                              TREATMENT DATE:   08/23/24  Red stability ball roll ups at wall x 10  Supine serratus punch 2 x 10 Rt  Supine holds at 90 degrees: small circles each way x 10 each direction Supine shoulder flexion x 10 Rt  Prone rows  2 x 10 Rt  Prone shoulder extension 2 x 10 Rt  Side lying ER 2 x 10 Rt (uncomfortable)  Seated shoulder scaption along plinth 2 x 10 Rt  Seated biceps curls 2x10 hammer curl 2x10 with supination  Seated shoulder pulleys 2' flexion and scaption each  Seated isometrics 50% effort: flexion,abduction, external rotation, internal rotation  5 sec holds 5x each Push up plus at wall x 10 Plank at wall + shoulder tap (increased soreness with Rt arm reaching across) Manual: STM to right biceps and delotoid for decreased muscle spasms and pain.   08/21/24 Quick DASH see above Red stability ball roll ups at wall x 10  Supine serratus punch 2 x 8 Rt  Supine holds at 90 degrees: small circles each way x 10 each direction Supine shoulder flexion x 10 Rt  Prone rows  2 x 10 Rt  Prone shoulder extension 2 x 10 Rt  Side lying ER 2 x 10 Rt (uncomfortable)  Seated shoulder scaption along plinth 2 x 10 Rt  Seated biceps curls x10 hammer curl x 10 with supination  Seated shoulder pulleys 2' flexion and scaption Seated isometrics 50% effort: flexion,abduction, external rotation, internal rotation  5 sec holds 5x each Push up plus at wall 2 x 10 Plank at wall + shoulder tap  (increased soreness) Manual: STM to right biceps and delotoid for decreased muscle spasms and pain.    08/16/24 Attempted seated UE Ranger ROM  but too painful ES IFC 10.5 ma with electrodes front of shoulder, back of shoulder, superior shoulder 25 min concurrent with exercise:  Supine holds at 90 degrees: small circles each way  Supine serratus punch 10x Prone shoulder extension to tolerance x 10  Prone rows  x 10  Prone circles each way 10x each Prone red ball rolls forward and back 10x Side lying ER  x 10 Attempted active elbow flexion but too painful so discontinued Seated isometrics 50% effort: flexion, extension, abduction, external rotation 5 sec holds 5x each Info given on TENS unit pt considering ordering for home use KT tape: 3 strips 50% stretch each; 1st strip distal and proximal anterior shoulder along biceps, 2nd strip posterior deltoids, 3rd strip horizontal mid biceps Pt instructed to monitor skin, she does have mild redness from 1 of the electrodes; instructed to remove tape in 2 days or sooner if there is irritation     PATIENT EDUCATION: Education details: BERDINE, Educated patient on anatomy and physiology of current symptoms, prognosis, plan of care as well as initial self care strategies to promote recovery  Person educated: Patient Education method: Explanation Education comprehension: verbalized understanding  HOME EXERCISE PROGRAM: Access Code: [email or web address] URL: [email or web address] Date: 08/03/2024 Prepared by: Orvil Beuhring  Exercises - Supine Cervical Retraction with Towel  - 2 x daily - 7 x weekly - 1 sets - 10 reps - 10 hold - Supine Cervical Rotation AROM on Pillow  - 2 x daily - 7 x weekly - 1 sets - 5 reps - 10 hold - Seated Scapular Retraction  - 2 x daily - 7 x weekly - 1 sets - 10 reps - Seated Cervical Rotation AROM  - 1 x daily - 7 x weekly - 2 sets - 10 reps - Seated Cervical Sidebending AROM  - 1 x daily - 7 x weekly - 2  sets - 10 reps - Seated Cervical Retraction  - 1 x daily - 7 x weekly - 2 sets - 10 reps - Seated Gripping Towel  - 1 x daily - 7 x weekly - 2 sets - 10 reps - Wrist Flexion Extension AROM - Palms Down  - 1 x daily - 7 x weekly - 2 sets - 10 reps - Closing and Opening Hand With Shoulder Sling  - 1 x daily - 7 x weekly - 2 sets - 10 reps - Seated Shoulder Pendulum Exercise  - 1 x daily - 7 x weekly - 2 sets - 10 reps - Supine Shoulder Flexion AAROM  - 2 x daily - 7 x weekly - 2 sets - 10 reps - Prone Shoulder Row  - 2 x daily - 7 x weekly - 2 sets - 10 reps - Standing Isometric Shoulder External Rotation with Doorway  - 1 x daily - 7 x weekly - 1 sets - 5 reps - 5 hold - Standing Isometric Shoulder Abduction with Doorway - Arm Bent  - 1 x daily - 7 x weekly - 1 sets - 5 reps - 5 hold - Standing Isometric Shoulder Flexion with Doorway - Arm Bent  - 1 x daily - 7 x weekly - 1 sets - 5 reps - 5 hold - Standing Isometric Shoulder Extension with Doorway - Arm Bent  - 1 x daily - 7 x weekly - 1 sets - 5 reps - 5 hold - Shoulder Flexion Wall Slide with Towel  - 2-3 x daily - 7 x weekly - 2 sets - 10 reps - Seated Shoulder Flexion AAROM with Pulley Behind  - 1 x daily - 7 x weekly - 1 sets -  60-120 reps - Seated Shoulder Scaption AAROM with Pulley at Side  - 1 x daily - 7 x weekly - 1 sets - 60-120 reps ASSESSMENT:  CLINICAL IMPRESSION: Esmae presents with minimal pain today. She felt good after last treatment session and did not feel any increases in pain. She tolerated gentle strengthening with some increased pain with serratus push ups and ER. Incorporated more closed chain stability exercises this week and she verbalized some soreness with shoulder tap exercise when reaching across with the Rt UE. She is highly compliant with HEP and incorporating exercises. Significant improvements on Quick DASH score since evaluation. She as an MD appointment scheduled next week. Patient will benefit from skilled PT  to address the below impairments and improve overall function.    Eval: Patient is a 48 y.o. female who was seen today for physical therapy evaluation and treatment 1 week s/p right shoulder scope with rotator cuff repair, biceps tenodesis and subacromial decompression.  The patient would benefit from skilled PT to address shoulder range of motion limitations while following surgical precautions. When appropriate will progress from passive ROM to active assisted to active ROM and progressive strengthening.  Her current impairments and pain affect ability to perform basic ADLs including personal hygiene, dressing and sleeping and she is unable to work at this time.   OBJECTIVE IMPAIRMENTS: decreased activity tolerance, decreased ROM, decreased strength, increased edema, impaired perceived functional ability, impaired UE functional use, and pain.   ACTIVITY LIMITATIONS: carrying, lifting, sleeping, bed mobility, bathing, toileting, dressing, reach over head, and hygiene/grooming  PARTICIPATION LIMITATIONS: meal prep, cleaning, laundry, driving, shopping, community activity, and occupation  PERSONAL FACTORS: Time since onset of injury/illness/exacerbation are also affecting patient's functional outcome.   REHAB POTENTIAL: Good  CLINICAL DECISION MAKING: Stable/uncomplicated  EVALUATION COMPLEXITY: Low   GOALS: Goals reviewed with patient? Yes  SHORT TERM GOALS: Target date: 08/04/2024     The patient will demonstrate knowledge of basic self care strategies and exercises to promote healing  Baseline: Goal status: met 11/17  2.  The patient will report a 40% improvement in pain levels with functional activities which are currently difficult including sleeping, dressing, bathing Baseline:  Goal status: ONGOING, SLEEPING IN RECLINER 11/17  3.  Passive elevation to 150 degrees needed for overhead reaching Baseline:  Goal status: met 11/17  4.  Quick DASH functional outcome score  improved to 70% indicating improved function with less pain Baseline:  Goal status: MET (38.6% ) 08/21/2024      LONG TERM GOALS: Target date: 09/15/2024    The patient will be independent in a safe self progression of a home exercise program to promote further recovery of function  Baseline:  Goal status: INITIAL  2.  The patient will report a 75% improvement in pain levels with functional activities which are currently difficult including sleeping, grooming/dressing, return to work Baseline:  Goal status: INITIAL  3.  The patient will have improved active shoulder elevation ROM to at least 130 degrees needed for grooming/dressing purposes as well as reaching high shelves  Baseline:  Goal status: INITIAL  4.  The patient will be have shoulder external and internal rotation to 40 degrees for dressing Baseline:  Goal status: INITIAL  5.  The patient will have grossly 4/5 strength needed to lift and lower a 2# object from a high shelf  Baseline:  Goal status: INITIAL  6.  Quick DASH functional outcome score improved to 60% indicating improved function with less pain Baseline:  Goal status: MET (38.6% ) 08/21/2024   PLAN:  PT FREQUENCY: 2x/week  PT DURATION: 12 weeks  PLANNED INTERVENTIONS: 02835- PT Re-evaluation, 97750- Physical Performance Testing, 97110-Therapeutic exercises, 97530- Therapeutic activity, 97112- Neuromuscular re-education, 97535- Self Care, 02859- Manual therapy, [phone]- Aquatic Therapy, [id number]- Electrical stimulation (unattended), [phone]- Electrical stimulation (manual), 97016- Vasopneumatic device, Patient/Family education, Taping, Joint mobilization, Cryotherapy, and Moist heat  PLAN FOR NEXT SESSION: assess response to treatment session; progress as tolerated; may need a slower progression of active assisted to active ROM secondary to pain; scapular stabilization glenohumeral and scapular mobilization   Burnard Joy, PT 08/23/24 5:08 PM  [facility]  [facility] [address], [address] [place], [place] [address] Phone # [phone] Fax [phone]

## 2024-08-28 ENCOUNTER — Encounter: Payer: Self-pay | Admitting: Dermatology

## 2024-08-28 ENCOUNTER — Other Ambulatory Visit (HOSPITAL_COMMUNITY): Payer: Self-pay

## 2024-08-28 ENCOUNTER — Ambulatory Visit: Admitting: Physical Therapy

## 2024-08-28 ENCOUNTER — Ambulatory Visit: Admitting: Dermatology

## 2024-08-28 ENCOUNTER — Encounter: Payer: Self-pay | Admitting: Physical Therapy

## 2024-08-28 VITALS — BP 149/65 | HR 83

## 2024-08-28 DIAGNOSIS — D229 Melanocytic nevi, unspecified: Secondary | ICD-10-CM

## 2024-08-28 DIAGNOSIS — W908XXA Exposure to other nonionizing radiation, initial encounter: Secondary | ICD-10-CM | POA: Diagnosis not present

## 2024-08-28 DIAGNOSIS — R293 Abnormal posture: Secondary | ICD-10-CM

## 2024-08-28 DIAGNOSIS — L821 Other seborrheic keratosis: Secondary | ICD-10-CM

## 2024-08-28 DIAGNOSIS — L810 Postinflammatory hyperpigmentation: Secondary | ICD-10-CM

## 2024-08-28 DIAGNOSIS — L814 Other melanin hyperpigmentation: Secondary | ICD-10-CM | POA: Diagnosis not present

## 2024-08-28 DIAGNOSIS — Z1283 Encounter for screening for malignant neoplasm of skin: Secondary | ICD-10-CM

## 2024-08-28 DIAGNOSIS — L578 Other skin changes due to chronic exposure to nonionizing radiation: Secondary | ICD-10-CM | POA: Diagnosis not present

## 2024-08-28 DIAGNOSIS — M25611 Stiffness of right shoulder, not elsewhere classified: Secondary | ICD-10-CM

## 2024-08-28 DIAGNOSIS — R600 Localized edema: Secondary | ICD-10-CM

## 2024-08-28 DIAGNOSIS — M6281 Muscle weakness (generalized): Secondary | ICD-10-CM

## 2024-08-28 DIAGNOSIS — D1801 Hemangioma of skin and subcutaneous tissue: Secondary | ICD-10-CM | POA: Diagnosis not present

## 2024-08-28 DIAGNOSIS — M25511 Pain in right shoulder: Secondary | ICD-10-CM

## 2024-08-28 DIAGNOSIS — L304 Erythema intertrigo: Secondary | ICD-10-CM

## 2024-08-28 MED ORDER — KETOCONAZOLE 2 % EX CREA
1.0000 | TOPICAL_CREAM | Freq: Two times a day (BID) | CUTANEOUS | 6 refills | Status: AC
  Filled 2024-08-28: qty 30, 15d supply, fill #0
  Filled 2024-09-27: qty 30, 15d supply, fill #1

## 2024-08-28 NOTE — Patient Instructions (Addendum)
 VISIT SUMMARY:  Today, you came in for a routine skin check and to evaluate your chronic elbow dermatitis. Your dermatitis has improved significantly, and you have stopped using steroids. You also discussed your history of eczema, recent shoulder surgery, and chronic intertrigo.  YOUR PLAN:  -NUMMULAR DERMATITIS:  Nummular dermatitis is a type of eczema that presents as round, itchy spots on the skin. Your condition has resolved with some pigment changes. You should keep the area moisturized and, if the condition recurs, take a picture and send it for further evaluation.  -CHRONIC INTERTRIGO:  Chronic intertrigo is a rash that occurs in skin folds due to moisture and friction. It is secondary to your recent weight loss. To prevent it, use Zeasorb powder and have ketoconazole  cream available for treatment if it occurs. Documenting this condition in your medical records will help support insurance coverage for potential surgery.  INSTRUCTIONS:  If your nummular dermatitis recurs, take a picture and send it for further evaluation. Continue using Zeasorb powder to prevent intertrigo and have ketoconazole  cream on hand for treatment if needed. Document any occurrences of intertrigo to support insurance coverage for potential surgery.       Important Information  Due to recent changes in healthcare laws, you may see results of your pathology and/or laboratory studies on MyChart before the doctors have had a chance to review them. We understand that in some cases there may be results that are confusing or concerning to you. Please understand that not all results are received at the same time and often the doctors may need to interpret multiple results in order to provide you with the best plan of care or course of treatment. Therefore, we ask that you please give us  2 business days to thoroughly review all your results before contacting the office for clarification. Should we see a critical lab  result, you will be contacted sooner.   If You Need Anything After Your Visit  If you have any questions or concerns for your doctor, please call our main line at [phone] If no one answers, please leave a voicemail as directed and we will return your call as soon as possible. Messages left after 4 pm will be answered the following business day.   You may also send us  a message via MyChart. We typically respond to MyChart messages within 1-2 business days.  For prescription refills, please ask your pharmacy to contact our office. Our fax number is [phone].  If you have an urgent issue when the clinic is closed that cannot wait until the next business day, you can page your doctor at the number below.    Please note that while we do our best to be available for urgent issues outside of office hours, we are not available 24/7.   If you have an urgent issue and are unable to reach us , you may choose to seek medical care at your doctor's office, retail clinic, urgent care center, or emergency room.  If you have a medical emergency, please immediately call 911 or go to the emergency department. In the event of inclement weather, please call our main line at [phone] for an update on the status of any delays or closures.  Dermatology Medication Tips: Please keep the boxes that topical medications come in in order to help keep track of the instructions about where and how to use these. Pharmacies typically print the medication instructions only on the boxes and not directly on the medication tubes.  If your medication is too expensive, please contact our office at [phone] or send us  a message through MyChart.   We are unable to tell what your co-pay for medications will be in advance as this is different depending on your insurance coverage. However, we may be able to find a substitute medication at lower cost or fill out paperwork to get insurance to cover a needed medication.    If a prior authorization is required to get your medication covered by your insurance company, please allow us  1-2 business days to complete this process.  Drug prices often vary depending on where the prescription is filled and some pharmacies may offer cheaper prices.  The website www.goodrx.com contains coupons for medications through different pharmacies. The prices here do not account for what the cost may be with help from insurance (it may be cheaper with your insurance), but the website can give you the price if you did not use any insurance.  - You can print the associated coupon and take it with your prescription to the pharmacy.  - You may also stop by our office during regular business hours and pick up a GoodRx coupon card.  - If you need your prescription sent electronically to a different pharmacy, notify our office through [facility] or by phone at [phone]

## 2024-08-28 NOTE — Progress Notes (Signed)
 "    Follow-Up Visit   Subjective  Caroline Gonzales is a 48 y.o. female who presents for the following: Rash & TBSE  Patient was last evaluated on 08/09/23.  At this visit patient was here for TBSE and presented with Dermatofibroma on left shin, Dilated pores on Bilateral axilae and mild actinic damage Recommendation was made to use The Ordinary Glycolic Acid to underarms Patient reports sxs are better.  Patient denies medication changes.  Today patient would like to discuss rash on left elbow that has been there for 5 months Patient states she has tried Diflucan , Triamcinolone  cream, CeraVe Healing ointment, Aquaphor and OTC hydrocortisone creams Patient reports area is not itchy but is dry, flaky and patchy   Total Body Skin Exam (TBSE) Visit   Subjective  Caroline Gonzales is a 48 y.o. female who presents for the following: Skin Cancer Screening and Full Body Skin Exam  Patient reports she does have spots, moles and lesions of concern to be evaluated. Patient reports throughout her lifetime she has had moderate sun exposure. Currently, patient reports if she has excessive sun exposure, she does apply sunscreen and/or wears protective coverings. Patient reports she has hx of bx with no diagnosis of skin cancer. Patient admits to  family history of skin cancers - Mother SCC.    The following portions of the chart were reviewed this encounter and updated as appropriate: medications, allergies, medical history  Review of Systems:  No other skin or systemic complaints except as noted in HPI or Assessment and Plan.  Objective  Well appearing patient in no apparent distress; mood and affect are within normal limits.  A full examination was performed including scalp, head, eyes, ears, nose, lips, neck, chest, axillae, abdomen, back, buttocks, bilateral upper extremities, bilateral lower extremities, hands, feet, fingers, toes, fingernails, and toenails. All findings within normal limits  unless otherwise noted below.   Relevant physical exam findings are noted in the Assessment and Plan.    Assessment & Plan   LENTIGINES, SEBORRHEIC KERATOSES, HEMANGIOMAS - Benign normal skin lesions - Benign-appearing - Call for any changes  MELANOCYTIC NEVI - Tan-brown and/or pink-flesh-colored symmetric macules and papules - Benign appearing on exam today - Observation - Call clinic for new or changing moles - Recommend daily use of broad spectrum spf 30+ sunscreen to sun-exposed areas.   ACTINIC DAMAGE - Chronic condition, secondary to cumulative UV/sun exposure - diffuse scaly erythematous macules with underlying dyspigmentation - Recommend daily broad spectrum sunscreen SPF 30+ to sun-exposed areas, reapply every 2 hours as needed.  - Staying in the shade or wearing long sleeves, sun glasses (UVA+UVB protection) and wide brim hats (4-inch brim around the entire circumference of the hat) are also recommended for sun protection.  - Call for new or changing lesions.  SKIN CANCER SCREENING PERFORMED TODAY.  Post Inflammatory hyperpigmentation from nummular Eczema Left Elbow Advised to use heavy moisturizing creams on area   CHRONIC INTERTRIGO POST WEIGHT LOSS PROCEDURE Exam: Erythematous macerated patches in body folds  Flared  Chronic intertrigo secondary to weight loss Chronic intertrigo secondary to drastic weight loss. No current infections reported. Discussed preventative measures and treatment options. - Use Zeasorb powder to prevent chronic intertrigo. - Have ketoconazole  cream on hand for treatment of intertrigo if it occurs.  Intertrigo is a chronic recurrent rash that occurs in skin fold areas that may be associated with friction; heat; moisture; yeast; fungus; and bacteria.  It is exacerbated by increased movement / activity;  sweating; and higher atmospheric temperature.  Use of an absorbant powder such as Zeasorb AF powder or other OTC antifungal powder to the  area daily can prevent rash recurrence. Other options to help keep the area dry include blow drying the area after bathing or using antiperspirant products such as Duradry sweat minimizing gel.  Treatment Plan: Prescribed Ketoconazole  2% to use twice daily as needed Recommended Zeasorb powder to apply to area to prevent excessive moisture ERYTHEMA INTERTRIGO   This Visit - ketoconazole  (NIZORAL ) 2 % cream - Apply 1 Application topically 2 (two) times daily.  Return in about 1 year (around 08/28/2025) for FBSE F/U.  LILLETTE Lyle Cords, am acting as a neurosurgeon for [employer], DO .   Documentation: I have reviewed the above documentation for accuracy and completeness, and I agree with the above.  Delon Lenis, DO   "

## 2024-08-28 NOTE — Therapy (Signed)
 OUTPATIENT PHYSICAL THERAPY SHOULDER TREATMENT   Patient Name: Caroline Gonzales MRN: [id number] DOB:[birth year], 48 y.o., female Today's Date: 08/28/2024  END OF SESSION:  PT End of Session - 08/28/24 1638     Visit Number 17    Date for Recertification  09/15/24    Authorization Type [employer]    PT Start Time 1618    PT Stop Time 1657    PT Time Calculation (min) 39 min    Activity Tolerance Patient tolerated treatment well    Behavior During Therapy [facility] for tasks assessed/performed                  Past Medical History:  Diagnosis Date   Acute costochondritis 11/14/2021   Allergy    Zyrtec .   Anxiety    followed by Dr. Melba   Back pain    Complication of anesthesia    PONV   Depression    Elevated blood pressure reading without diagnosis of hypertension 12/29/2021   Fibroid    Gastroparesis 09/14/2012   gastric emptying study in 2014   GERD (gastroesophageal reflux disease)    Headache    Hypertriglyceridemia 10/07/2017   Nightmares 07/21/2018   Pneumonia    2013ish   PONV (postoperative nausea and vomiting)     likes scopolamine  patch and zofran  /phenergan  helps   Pre-diabetes    PTSD (post-traumatic stress disorder)    Residual foreign body in soft tissue 11/12/2016   Screening for colon cancer 07/02/2022   Sleep apnea 07/01/2018   cpap optional, pt close to not use cpap   Vitamin D  deficiency    Wears glasses    Past Surgical History:  Procedure Laterality Date   ABDOMINAL HYSTERECTOMY  01/2021   ANKLE ARTHROSCOPY Left 2011   BICEPT TENODESIS Right 06/16/2024   Procedure: TENODESIS, BICEPS;  Surgeon: Sharl Selinda Dover, MD;  Location: [facility];  Service: Orthopedics;  Laterality: Right;   CESAREAN SECTION  06/2006   x 1   CHOLECYSTECTOMY  07/2006   laparoscopic   CYSTOSCOPY N/A 02/04/2021   Procedure: CYSTOSCOPY;  Surgeon: Jannis Kate Norris, MD;  Location: [facility] OR;  Service: Gynecology;  Laterality: N/A;   DILATION  AND CURETTAGE OF UTERUS  1997   x 2   FOREIGN BODY REMOVAL Left 11/18/2016   Procedure: REMOVAL FOREIGN BODY EXTREMITY LEFT FOOT;  Surgeon: Donnice JONELLE Fees, DPM;  Location: [facility] OR;  Service: Podiatry;  Laterality: Left;   GASTRIC ROUX-EN-Y N/A 04/13/2023   Procedure: LAPAROSCOPIC ROUX-EN-Y GASTRIC BYPASS WITH UPPER ENDOSCOPY;  Surgeon: Signe Mitzie LABOR, MD;  Location: [facility];  Service: General;  Laterality: N/A;   KNEE ARTHROSCOPY WITH MEDIAL MENISECTOMY Right 09/10/2021   Procedure: KNEE ARTHROSCOPY WITH MEDIAL MENISCAL ROOT REPAIR;  Surgeon: Cristy Bonner DASEN, MD;  Location: [facility];  Service: Orthopedics;  Laterality: Right;   PILONIDAL CYST EXCISION  1990's   RADIOLOGY WITH ANESTHESIA N/A 11/10/2018   Procedure: MRI WITH ANESTHESIA OF BRAIN AND ORBITS WITH AND WITHOUT CONTRAST;  Surgeon: Radiologist, Medication, MD;  Location: [facility] OR;  Service: Radiology;  Laterality: N/A;   SHOULDER ARTHROSCOPY WITH ROTATOR CUFF REPAIR Right 06/16/2024   Procedure: ARTHROSCOPY, SHOULDER, WITH ROTATOR CUFF REPAIR;  Surgeon: Sharl Selinda Dover, MD;  Location: [facility];  Service: Orthopedics;  Laterality: Right;   SUBACROMIAL DECOMPRESSION Right 06/16/2024   Procedure: DECOMPRESSION, SUBACROMIAL SPACE;  Surgeon: Sharl Selinda Dover, MD;  Location: [facility];  Service: Orthopedics;  Laterality: Right;  Right shoulder arthroscopy with rotator cuff repair, biceps tenodesis, subacromial decompression, distal clavicle excision   TENDON REPAIR Left 2011   Left Ankle   TOTAL LAPAROSCOPIC HYSTERECTOMY WITH SALPINGECTOMY Bilateral 02/04/2021   Procedure: TOTAL LAPAROSCOPIC HYSTERECTOMY WITH SALPINGECTOMY;  Surgeon: Jannis Kate Norris, MD;  Location: [facility] OR;  Service: Gynecology;  Laterality: Bilateral;   UPPER GASTROINTESTINAL ENDOSCOPY     WISDOM TOOTH EXTRACTION  1990's   Patient Active Problem List   Diagnosis Date Noted   History of nocturnal hypoglycemia  03/02/2024   Encounter for annual physical exam 03/02/2024   Family history of elevated lipoprotein (a) 04/26/2023   PVC's (premature ventricular contractions) 04/26/2023   Morbid obesity (HCC) 04/13/2023   Gastroesophageal reflux disease 12/24/2022   Abnormal laboratory test 12/24/2022   Chest wall pain 12/24/2022   Change in facial mole 12/29/2021   S/P laparoscopic hysterectomy 02/04/2021   PTSD (post-traumatic stress disorder) 07/21/2018   GAD (generalized anxiety disorder) 07/21/2018   Insomnia 07/21/2018   Recurrent major depressive disorder, in partial remission 07/21/2018   Insulin  resistance 10/21/2017   Hypertriglyceridemia 10/07/2017   Vitamin D  deficiency 10/07/2017   Pre-diabetes 11/02/2016   Morbid obesity with body mass index (BMI) of 50.0 to 59.9 in adult (HCC) 10/15/2016    PCP: Oris Credit NP  REFERRING PROVIDER: Sharl Mayo MD  REFERRING DIAG: s/p right shoulder scope with rotator cuff repair, biceps tenodesis and subacromial decompression  THERAPY DIAG: right shoulder pain; right shoulder stiffness; weakness   Rationale for Evaluation and Treatment: Rehabilitation  ONSET DATE: Spring 2025  SUBJECTIVE:                                                                                                                                                                                      SUBJECTIVE STATEMENT: I'm wearing my TENS 4-5x per day.   using the ice machine several times a day - my husband helps me set it up.   Hand dominance: Right  Date of surgery 06/16/2024 s/p right shoulder scope with rotator cuff repair, biceps tenodesis and subacromial decompression Pt reports that MD advised her that she could sleep with sling off with pillow support and that she may have her arm straight with support A/ROM to begin at 6 weeks  PERTINENT HISTORY: Right shoulder scope with rotator cuff repair, biceps tenodesis, and subacromial decompression on  06/16/24 Knee scope 12/29/23 Can't take NSAIDS b/c of gastric bypass  PAIN:  08/28/2024  Are you having pain? Yes NPRS scale: 2-7/10  right upper arm  Pain location: right elbow - achy comes and goes - located at lateral epicondyle, right shoulder achy Pain orientation: Right  PAIN  TYPE: aching Pain description: constant  Aggravating factors: repetition,  Relieving factors: medication   PRECAUTIONS: Shoulder surgical   WEIGHT BEARING RESTRICTIONS: No  FALLS:  Has patient fallen in last 6 months? 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  PLOF: Independent  PATIENT GOALS:baseline be 75% ; shopping, goes to [employer] stretching class in the [place]   NEXT MD VISIT:   OBJECTIVE:  Note: Objective measures were completed at Evaluation unless otherwise noted.  DIAGNOSTIC FINDINGS:  Full thickness tear of subscapularis and supraspinatus and biceps tear  PATIENT SURVEYS:  Quick Dash:  QUICK DASH  Please rate your ability do the following activities in the last week by selecting the number below the appropriate response.   Activities Rating  Open a tight or new jar.  5 = Unable  Do heavy household chores (e.g., wash walls, floors). 5 = Unable  Carry a shopping bag or briefcase 5 = Unable  Wash your back. 5 = Unable  Use a knife to cut food. 5 = Unable  Recreational activities in which you take some force or impact through your arm, shoulder or hand (e.g., golf, hammering, tennis, etc.). 5 = Unable  During the past week, to what extent has your arm, shoulder or hand problem interfered with your normal social activities with family, friends, neighbors or groups?  4 = Quite a bit  During the past week, were you limited in your work or other regular daily activities as a result of your arm, shoulder or hand problem? 5 = Unable  Rate the  severity of the following symptoms in the last week: Arm, Shoulder, or hand pain. 4 = Severe  Rate the severity of the following symptoms in the last week: Tingling (pins and needles) in your arm, shoulder or hand. 1 = none  During the past week, how much difficulty have you had sleeping because of the pain in your arm, shoulder or hand?  3 = Moderate difficulty   (A QuickDASH score may not be calculated if there is greater than 1 missing item.)  Quick Dash Disability/Symptom Score: 81.8%   Minimally Clinically Important Difference (MCID): 15-20 points    08/21/2024 Quick DASH : 38.6/100 38.6%   COGNITION: Overall cognitive status: Within functional limits for tasks assessed     SENSATION: Denies numbness/tingling  APPEARANCE:  3 portal incisions with steri strips; no redness; no drainage; amount of edema consistent with 1 week post op    UPPER EXTREMITY ROM:  Passive ROM Right eval   Shoulder flexion 15   Shoulder extension    Shoulder abduction 10   Shoulder adduction    Shoulder extension    Shoulder internal rotation 45   Shoulder external rotation 0   Elbow flexion    Elbow extension    Wrist flexion    Wrist extension    Wrist ulnar deviation    Wrist radial deviation    Wrist pronation    Wrist supination     (Blank rows = not tested)   Active ROM Right eval Left eval  Shoulder flexion Not tested secondary to surgical precautions WFLS  Shoulder extension    Shoulder abduction  WFLS  Shoulder adduction    Shoulder internal rotation  WFLs  Shoulder external rotation  WFLs  Elbow flexion    Elbow extension    Wrist flexion  Wrist extension    Wrist ulnar deviation    Wrist radial deviation    Wrist pronation    Wrist supination    (Blank rows = not tested)  UPPER EXTREMITY MMT:  MMT Right eval Left eval  Shoulder flexion Not tested secondary to surgical precautions 5  Shoulder extension  5  Shoulder abduction  5  Shoulder adduction     Shoulder internal rotation  5  Shoulder external rotation  5  Middle trapezius    Lower trapezius    Elbow flexion  5  Elbow extension  5  Wrist flexion  5  Wrist extension  5  Wrist ulnar deviation    Wrist radial deviation    Wrist pronation    Wrist supination    Grip strength (lbs)    (Blank rows = not tested)                                                                                                                              TREATMENT DATE:   08/28/24  Red stability ball roll ups at wall x 10  Push up plus at wall x 10 Plank at wall + shoulder tap (increased soreness with Rt arm reaching across) Yellow band isometric walkouts: yellow x 10 ea dir at 90 deg elbow flex Supine serratus punch 2# 2 x 10 Rt  Supine holds at 90 degrees: 2# small circles each way x 10 each direction Supine shoulder flexion x 10 Rt  Prone rows 2#  2 x 10 Rt  Prone shoulder extension 2# 2 x 10 Rt  Side lying ER 2 x 10 Rt (uncomfortable) attempted weight but too difficult Seated biceps curls 2x10 hammer curl 2# 2x10 with supination  Wall ladder into ABD x 2 (good going up, painful coming down) - tried various AA ABD - door, stairs, standing dowel, SL and supine. Doorway slide was the most comfortable.  Discussed resting her shoulder to help with pain in addition to ice and isometrics    08/23/24   Red stability ball roll ups at wall x 10  Supine serratus punch 2 x 10 Rt  Supine holds at 90 degrees: small circles each way x 10 each direction Supine shoulder flexion x 10 Rt  Prone rows  2 x 10 Rt  Prone shoulder extension 2 x 10 Rt  Side lying ER 2 x 10 Rt (uncomfortable)  Seated shoulder scaption along plinth 2 x 10 Rt  Seated biceps curls 2x10 hammer curl 2x10 with supination  Seated shoulder pulleys 2' flexion and scaption each  Seated isometrics 50% effort: flexion,abduction, external rotation, internal rotation  5 sec holds 5x each Push up plus at wall x 10 Plank at wall +  shoulder tap (increased soreness with Rt arm reaching across) Manual: STM to right biceps and delotoid for decreased muscle spasms and pain.   08/21/24 Quick DASH see above Red stability ball roll ups at wall x 10  Supine serratus punch 2 x 8 Rt  Supine holds at 90 degrees: small circles each way x 10 each direction Supine shoulder flexion x 10 Rt  Prone rows  2 x 10 Rt  Prone shoulder extension 2 x 10 Rt  Side lying ER 2 x 10 Rt (uncomfortable)  Seated shoulder scaption along plinth 2 x 10 Rt  Seated biceps curls x10 hammer curl x 10 with supination  Seated shoulder pulleys 2' flexion and scaption Seated isometrics 50% effort: flexion,abduction, external rotation, internal rotation  5 sec holds 5x each Push up plus at wall 2 x 10 Plank at wall + shoulder tap (increased soreness) Manual: STM to right biceps and delotoid for decreased muscle spasms and pain.    08/16/24 Attempted seated UE Ranger ROM  but too painful ES IFC 10.5 ma with electrodes front of shoulder, back of shoulder, superior shoulder 25 min concurrent with exercise: Supine holds at 90 degrees: small circles each way  Supine serratus punch 10x Prone shoulder extension to tolerance x 10  Prone rows  x 10  Prone circles each way 10x each Prone red ball rolls forward and back 10x Side lying ER  x 10 Attempted active elbow flexion but too painful so discontinued Seated isometrics 50% effort: flexion, extension, abduction, external rotation 5 sec holds 5x each Info given on TENS unit pt considering ordering for home use KT tape: 3 strips 50% stretch each; 1st strip distal and proximal anterior shoulder along biceps, 2nd strip posterior deltoids, 3rd strip horizontal mid biceps Pt instructed to monitor skin, she does have mild redness from 1 of the electrodes; instructed to remove tape in 2 days or sooner if there is irritation     PATIENT EDUCATION: Education details: Caroline Gonzales, Educated patient on anatomy and  physiology of current symptoms, prognosis, plan of care as well as initial self care strategies to promote recovery  Person educated: Patient Education method: Explanation Education comprehension: verbalized understanding  HOME EXERCISE PROGRAM: Access Code: [email or web address] URL: [email or web address] Date: 08/03/2024 Prepared by: Orvil Beuhring  Exercises - Supine Cervical Retraction with Towel  - 2 x daily - 7 x weekly - 1 sets - 10 reps - 10 hold - Supine Cervical Rotation AROM on Pillow  - 2 x daily - 7 x weekly - 1 sets - 5 reps - 10 hold - Seated Scapular Retraction  - 2 x daily - 7 x weekly - 1 sets - 10 reps - Seated Cervical Rotation AROM  - 1 x daily - 7 x weekly - 2 sets - 10 reps - Seated Cervical Sidebending AROM  - 1 x daily - 7 x weekly - 2 sets - 10 reps - Seated Cervical Retraction  - 1 x daily - 7 x weekly - 2 sets - 10 reps - Seated Gripping Towel  - 1 x daily - 7 x weekly - 2 sets - 10 reps - Wrist Flexion Extension AROM - Palms Down  - 1 x daily - 7 x weekly - 2 sets - 10 reps - Closing and Opening Hand With Shoulder Sling  - 1 x daily - 7 x weekly - 2 sets - 10 reps - Seated Shoulder Pendulum Exercise  - 1 x daily - 7 x weekly - 2 sets - 10 reps - Supine Shoulder Flexion AAROM  - 2 x daily - 7 x weekly - 2 sets - 10 reps - Prone Shoulder Row  - 2 x daily - 7 x weekly - 2  sets - 10 reps - Standing Isometric Shoulder External Rotation with Doorway  - 1 x daily - 7 x weekly - 1 sets - 5 reps - 5 hold - Standing Isometric Shoulder Abduction with Doorway - Arm Bent  - 1 x daily - 7 x weekly - 1 sets - 5 reps - 5 hold - Standing Isometric Shoulder Flexion with Doorway - Arm Bent  - 1 x daily - 7 x weekly - 1 sets - 5 reps - 5 hold - Standing Isometric Shoulder Extension with Doorway - Arm Bent  - 1 x daily - 7 x weekly - 1 sets - 5 reps - 5 hold - Shoulder Flexion Wall Slide with Towel  - 2-3 x daily - 7 x weekly - 2 sets - 10 reps - Seated Shoulder Flexion AAROM  with Pulley Behind  - 1 x daily - 7 x weekly - 1 sets - 60-120 reps - Seated Shoulder Scaption AAROM with Pulley at Side  - 1 x daily - 7 x weekly - 1 sets - 60-120 reps ASSESSMENT:  CLINICAL IMPRESSION: Kayleena presents with minimal pain today secondary to wearing her TENS unit. She has been very active with her shoulder wrapping gifts and doing chores. She uses the TENS 4-5 x per day. She tolerated isometrics walkouts with no pain today. She had increased pain with return from flexion after supine exercises which was likely a culmination of everything. PT advised she rest the shoulder rather than just using the TENS and continuing to work.  She as an MD appointment Thursday.  Patient will benefit from skilled PT to address the below impairments and improve overall function.    Eval: Patient is a 47 y.o. female who was seen today for physical therapy evaluation and treatment 1 week s/p right shoulder scope with rotator cuff repair, biceps tenodesis and subacromial decompression.  The patient would benefit from skilled PT to address shoulder range of motion limitations while following surgical precautions. When appropriate will progress from passive ROM to active assisted to active ROM and progressive strengthening.  Her current impairments and pain affect ability to perform basic ADLs including personal hygiene, dressing and sleeping and she is unable to work at this time.   OBJECTIVE IMPAIRMENTS: decreased activity tolerance, decreased ROM, decreased strength, increased edema, impaired perceived functional ability, impaired UE functional use, and pain.   ACTIVITY LIMITATIONS: carrying, lifting, sleeping, bed mobility, bathing, toileting, dressing, reach over head, and hygiene/grooming  PARTICIPATION LIMITATIONS: meal prep, cleaning, laundry, driving, shopping, community activity, and occupation  PERSONAL FACTORS: Time since onset of injury/illness/exacerbation are also affecting patient's  functional outcome.   REHAB POTENTIAL: Good  CLINICAL DECISION MAKING: Stable/uncomplicated  EVALUATION COMPLEXITY: Low   GOALS: Goals reviewed with patient? Yes  SHORT TERM GOALS: Target date: 08/04/2024     The patient will demonstrate knowledge of basic self care strategies and exercises to promote healing  Baseline: Goal status: met 11/17  2.  The patient will report a 40% improvement in pain levels with functional activities which are currently difficult including sleeping, dressing, bathing Baseline:  Goal status: ONGOING, SLEEPING IN RECLINER 11/17  3.  Passive elevation to 150 degrees needed for overhead reaching Baseline:  Goal status: met 11/17  4.  Quick DASH functional outcome score improved to 70% indicating improved function with less pain Baseline:  Goal status: MET (38.6% ) 08/21/2024      LONG TERM GOALS: Target date: 09/15/2024    The patient will be independent  in a safe self progression of a home exercise program to promote further recovery of function  Baseline:  Goal status: INITIAL  2.  The patient will report a 75% improvement in pain levels with functional activities which are currently difficult including sleeping, grooming/dressing, return to work Baseline:  Goal status: INITIAL  3.  The patient will have improved active shoulder elevation ROM to at least 130 degrees needed for grooming/dressing purposes as well as reaching high shelves  Baseline:  Goal status: INITIAL  4.  The patient will be have shoulder external and internal rotation to 40 degrees for dressing Baseline:  Goal status: INITIAL  5.  The patient will have grossly 4/5 strength needed to lift and lower a 2# object from a high shelf  Baseline:  Goal status: INITIAL  6.  Quick DASH functional outcome score improved to 60% indicating improved function with less pain Baseline:  Goal status: MET (38.6% ) 08/21/2024   PLAN:  PT FREQUENCY: 2x/week  PT DURATION: 12  weeks  PLANNED INTERVENTIONS: 97164- PT Re-evaluation, 97750- Physical Performance Testing, 97110-Therapeutic exercises, 97530- Therapeutic activity, 97112- Neuromuscular re-education, 97535- Self Care, [id number]- Manual therapy, [phone]- Aquatic Therapy, [id number]- Electrical stimulation (unattended), [id number]- Electrical stimulation (manual), 97016- Vasopneumatic device, Patient/Family education, Taping, Joint mobilization, Cryotherapy, and Moist heat  PLAN FOR NEXT SESSION: assess response to treatment session; progress as tolerated; may need a slower progression of active assisted to active ROM secondary to pain; scapular stabilization glenohumeral and scapular mobilization   Burnard Joy, PT 08/28/2024 5:03 PM  [facility] [address], [address] [place], [place] [address] Phone # [phone] Fax [phone]

## 2024-08-29 DIAGNOSIS — F4310 Post-traumatic stress disorder, unspecified: Secondary | ICD-10-CM | POA: Diagnosis not present

## 2024-08-30 ENCOUNTER — Other Ambulatory Visit (HOSPITAL_COMMUNITY): Payer: Self-pay

## 2024-08-31 ENCOUNTER — Encounter: Payer: Self-pay | Admitting: Physical Therapy

## 2024-08-31 ENCOUNTER — Ambulatory Visit: Admitting: Physical Therapy

## 2024-08-31 DIAGNOSIS — M6281 Muscle weakness (generalized): Secondary | ICD-10-CM

## 2024-08-31 DIAGNOSIS — M25611 Stiffness of right shoulder, not elsewhere classified: Secondary | ICD-10-CM

## 2024-08-31 DIAGNOSIS — M25511 Pain in right shoulder: Secondary | ICD-10-CM | POA: Diagnosis not present

## 2024-08-31 NOTE — Therapy (Signed)
 OUTPATIENT PHYSICAL THERAPY SHOULDER TREATMENT   Patient Name: Caroline Gonzales MRN: [id number] DOB:24-Jun-1976, 48 y.o., female Today's Date: 08/31/2024  END OF SESSION:  PT End of Session - 08/31/24 1603     Visit Number 18    Date for Recertification  09/15/24    Authorization Type [employer]    PT Start Time 1604    PT Stop Time 1645    PT Time Calculation (min) 41 min    Activity Tolerance Patient tolerated treatment well                  Past Medical History:  Diagnosis Date   Acute costochondritis 11/14/2021   Allergy    Zyrtec .   Anxiety    followed by Dr. Melba   Back pain    Complication of anesthesia    PONV   Depression    Elevated blood pressure reading without diagnosis of hypertension 12/29/2021   Fibroid    Gastroparesis 09/14/2012   gastric emptying study in 2014   GERD (gastroesophageal reflux disease)    Headache    Hypertriglyceridemia 10/07/2017   Nightmares 07/21/2018   Pneumonia    2013ish   PONV (postoperative nausea and vomiting)     likes scopolamine  patch and zofran  /phenergan  helps   Pre-diabetes    PTSD (post-traumatic stress disorder)    Residual foreign body in soft tissue 11/12/2016   Screening for colon cancer 07/02/2022   Sleep apnea 07/01/2018   cpap optional, pt close to not use cpap   Vitamin D  deficiency    Wears glasses    Past Surgical History:  Procedure Laterality Date   ABDOMINAL HYSTERECTOMY  01/2021   ANKLE ARTHROSCOPY Left 2011   BICEPT TENODESIS Right 06/16/2024   Procedure: TENODESIS, BICEPS;  Surgeon: Sharl Selinda Dover, MD;  Location: [facility];  Service: Orthopedics;  Laterality: Right;   CESAREAN SECTION  06/2006   x 1   CHOLECYSTECTOMY  07/2006   laparoscopic   CYSTOSCOPY N/A 02/04/2021   Procedure: CYSTOSCOPY;  Surgeon: Jannis Kate Norris, MD;  Location: [facility] OR;  Service: Gynecology;  Laterality: N/A;   DILATION AND CURETTAGE OF UTERUS  1997   x 2   FOREIGN BODY REMOVAL  Left 11/18/2016   Procedure: REMOVAL FOREIGN BODY EXTREMITY LEFT FOOT;  Surgeon: Donnice JONELLE Fees, DPM;  Location: [facility] OR;  Service: Podiatry;  Laterality: Left;   GASTRIC ROUX-EN-Y N/A 04/13/2023   Procedure: LAPAROSCOPIC ROUX-EN-Y GASTRIC BYPASS WITH UPPER ENDOSCOPY;  Surgeon: Signe Mitzie LABOR, MD;  Location: [facility];  Service: General;  Laterality: N/A;   KNEE ARTHROSCOPY WITH MEDIAL MENISECTOMY Right 09/10/2021   Procedure: KNEE ARTHROSCOPY WITH MEDIAL MENISCAL ROOT REPAIR;  Surgeon: Cristy Bonner DASEN, MD;  Location: [facility];  Service: Orthopedics;  Laterality: Right;   PILONIDAL CYST EXCISION  1990's   RADIOLOGY WITH ANESTHESIA N/A 11/10/2018   Procedure: MRI WITH ANESTHESIA OF BRAIN AND ORBITS WITH AND WITHOUT CONTRAST;  Surgeon: Radiologist, Medication, MD;  Location: [facility] OR;  Service: Radiology;  Laterality: N/A;   SHOULDER ARTHROSCOPY WITH ROTATOR CUFF REPAIR Right 06/16/2024   Procedure: ARTHROSCOPY, SHOULDER, WITH ROTATOR CUFF REPAIR;  Surgeon: Sharl Selinda Dover, MD;  Location: [facility];  Service: Orthopedics;  Laterality: Right;   SUBACROMIAL DECOMPRESSION Right 06/16/2024   Procedure: DECOMPRESSION, SUBACROMIAL SPACE;  Surgeon: Sharl Selinda Dover, MD;  Location:  SURGERY CENTER;  Service: Orthopedics;  Laterality: Right;  Right shoulder arthroscopy with rotator cuff repair, biceps tenodesis,  subacromial decompression, distal clavicle excision   TENDON REPAIR Left 2011   Left Ankle   TOTAL LAPAROSCOPIC HYSTERECTOMY WITH SALPINGECTOMY Bilateral 02/04/2021   Procedure: TOTAL LAPAROSCOPIC HYSTERECTOMY WITH SALPINGECTOMY;  Surgeon: Jannis Kate Norris, MD;  Location: [facility] OR;  Service: Gynecology;  Laterality: Bilateral;   UPPER GASTROINTESTINAL ENDOSCOPY     WISDOM TOOTH EXTRACTION  1990's   Patient Active Problem List   Diagnosis Date Noted   History of nocturnal hypoglycemia 03/02/2024   Encounter for annual physical exam 03/02/2024    Family history of elevated lipoprotein (a) 04/26/2023   PVC's (premature ventricular contractions) 04/26/2023   Morbid obesity (HCC) 04/13/2023   Gastroesophageal reflux disease 12/24/2022   Abnormal laboratory test 12/24/2022   Chest wall pain 12/24/2022   Change in facial mole 12/29/2021   S/P laparoscopic hysterectomy 02/04/2021   PTSD (post-traumatic stress disorder) 07/21/2018   GAD (generalized anxiety disorder) 07/21/2018   Insomnia 07/21/2018   Recurrent major depressive disorder, in partial remission 07/21/2018   Insulin  resistance 10/21/2017   Hypertriglyceridemia 10/07/2017   Vitamin D  deficiency 10/07/2017   Pre-diabetes 11/02/2016   Morbid obesity with body mass index (BMI) of 50.0 to 59.9 in adult (HCC) 10/15/2016    PCP: Oris Credit NP  REFERRING PROVIDER: Sharl Mayo MD  REFERRING DIAG: s/p right shoulder scope with rotator cuff repair, biceps tenodesis and subacromial decompression  THERAPY DIAG: right shoulder pain; right shoulder stiffness; weakness   Rationale for Evaluation and Treatment: Rehabilitation  ONSET DATE: Spring 2025  SUBJECTIVE:                                                                                                                                                                                      SUBJECTIVE STATEMENT: The doctor was very happy with my progress.  He wants me to start working on internal rotation.  Using TENs often, wearing now.  A little sore after last time so slept in the chair which keeps me from pushing up on my arm to change positions.  Fell on to the bed 2 days ago so I've been kind of resting it.     Hand dominance: Right  Date of surgery 06/16/2024 s/p right shoulder scope with rotator cuff repair, biceps tenodesis and subacromial decompression Pt reports that MD advised her that she could sleep with sling off with pillow support and that she may have her arm straight with support A/ROM to begin at 6  weeks  PERTINENT HISTORY: Right shoulder scope with rotator cuff repair, biceps tenodesis, and subacromial decompression on 06/16/24 Knee scope 12/29/23 Can't take NSAIDS b/c of gastric bypass  PAIN:  08/31/2024  Are you having  pain? Yes NPRS scale: 0/10 with TENS on right upper arm  Pain location: right elbow - achy comes and goes - located at lateral epicondyle, right shoulder achy Pain orientation: Right  PAIN TYPE: aching Pain description: constant  Aggravating factors: repetition,  Relieving factors: medication   PRECAUTIONS: Shoulder surgical   WEIGHT BEARING RESTRICTIONS: No  FALLS:  Has patient fallen in last 6 months? 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  PLOF: Independent  PATIENT GOALS:baseline be 75% ; shopping, goes to [employer] stretching class in the [place]   NEXT MD VISIT:   OBJECTIVE:  Note: Objective measures were completed at Evaluation unless otherwise noted.  DIAGNOSTIC FINDINGS:  Full thickness tear of subscapularis and supraspinatus and biceps tear  PATIENT SURVEYS:  Quick Dash:  QUICK DASH  Please rate your ability do the following activities in the last week by selecting the number below the appropriate response.   Activities Rating  Open a tight or new jar.  5 = Unable  Do heavy household chores (e.g., wash walls, floors). 5 = Unable  Carry a shopping bag or briefcase 5 = Unable  Wash your back. 5 = Unable  Use a knife to cut food. 5 = Unable  Recreational activities in which you take some force or impact through your arm, shoulder or hand (e.g., golf, hammering, tennis, etc.). 5 = Unable  During the past week, to what extent has your arm, shoulder or hand problem interfered with your normal social activities with family, friends, neighbors or groups?  4 = Quite a bit  During the past week,  were you limited in your work or other regular daily activities as a result of your arm, shoulder or hand problem? 5 = Unable  Rate the severity of the following symptoms in the last week: Arm, Shoulder, or hand pain. 4 = Severe  Rate the severity of the following symptoms in the last week: Tingling (pins and needles) in your arm, shoulder or hand. 1 = none  During the past week, how much difficulty have you had sleeping because of the pain in your arm, shoulder or hand?  3 = Moderate difficulty   (A QuickDASH score may not be calculated if there is greater than 1 missing item.)  Quick Dash Disability/Symptom Score: 81.8%   Minimally Clinically Important Difference (MCID): 15-20 points    08/21/2024 Quick DASH : 38.6/100 38.6%   COGNITION: Overall cognitive status: Within functional limits for tasks assessed     SENSATION: Denies numbness/tingling  APPEARANCE:  3 portal incisions with steri strips; no redness; no drainage; amount of edema consistent with 1 week post op    UPPER EXTREMITY ROM:  Passive ROM Right eval   Shoulder flexion 15   Shoulder extension    Shoulder abduction 10   Shoulder adduction    Shoulder extension    Shoulder internal rotation 45   Shoulder external rotation 0   Elbow flexion    Elbow extension    Wrist flexion    Wrist extension    Wrist ulnar deviation    Wrist radial deviation    Wrist pronation    Wrist supination     (Blank rows = not tested)   Active ROM Right eval Left eval 12/18  Shoulder flexion Not tested secondary to surgical precautions WFLS 153  Shoulder extension  Shoulder abduction  WFLS 135 painful   Shoulder adduction     Shoulder internal rotation  WFLs Thumb to Coccyx   Shoulder external rotation  WFLs 78  Elbow flexion     Elbow extension     Wrist flexion     Wrist extension     Wrist ulnar deviation     Wrist radial deviation     Wrist pronation     Wrist supination     (Blank rows = not tested)  UPPER  EXTREMITY MMT:  MMT Right eval Left eval 12/18 R  Shoulder flexion Not tested secondary to surgical precautions 5 3+  Shoulder extension  5   Shoulder abduction  5 3-  Shoulder adduction     Shoulder internal rotation  5 4  Shoulder external rotation  5 4  Middle trapezius     Lower trapezius     Elbow flexion  5   Elbow extension  5   Wrist flexion  5   Wrist extension  5   Wrist ulnar deviation     Wrist radial deviation     Wrist pronation     Wrist supination     Grip strength (lbs)     (Blank rows = not tested)                                                                                                                              TREATMENT DATE:  08/31/24 Pt wearing TENS for duration of session Discussion on techniques for internal rotation counter top assist Shoulder ROM as above Supine serratus punch 2# 10x  Supine arm at 90 degrees 1#: ABCs, circles, 12:00/6:00 and 9:00/3:00 Sidelying arm at 90 degrees: no weight circles, 12:00/6:00 and 9:00/3:00 Prone rows 2#  2 x 10 Rt  Prone shoulder extension 2# 2 x 10 Rt  Prone horizontal abduction approx 30 degrees 10x (small ROM) Prone scaption 20 degrees 10x (very small ROM) UE Ranger on wall mid level: flexion 10x, scaption 10x Yellow band internal and external rotation mid ROM 10x each Yellow band isometric holds with side stepping (3 steps) 5x each way   08/28/24  Red stability ball roll ups at wall x 10  Push up plus at wall x 10 Plank at wall + shoulder tap (increased soreness with Rt arm reaching across) Yellow band isometric walkouts: yellow x 10 ea dir at 90 deg elbow flex Supine serratus punch 2# 2 x 10 Rt  Supine holds at 90 degrees: 2# small circles each way x 10 each direction Supine shoulder flexion x 10 Rt  Prone rows 2#  2 x 10 Rt  Prone shoulder extension 2# 2 x 10 Rt  Side lying ER 2 x 10 Rt (uncomfortable) attempted weight but too difficult Seated biceps curls 2x10 hammer curl 2# 2x10 with  supination  Wall ladder into ABD x 2 (good going up, painful coming down) - tried various AA ABD -  door, stairs, standing dowel, SL and supine. Doorway slide was the most comfortable.  Discussed resting her shoulder to help with pain in addition to ice and isometrics    08/23/24   Red stability ball roll ups at wall x 10  Supine serratus punch 2 x 10 Rt  Supine holds at 90 degrees: small circles each way x 10 each direction Supine shoulder flexion x 10 Rt  Prone rows  2 x 10 Rt  Prone shoulder extension 2 x 10 Rt  Side lying ER 2 x 10 Rt (uncomfortable)  Seated shoulder scaption along plinth 2 x 10 Rt  Seated biceps curls 2x10 hammer curl 2x10 with supination  Seated shoulder pulleys 2' flexion and scaption each  Seated isometrics 50% effort: flexion,abduction, external rotation, internal rotation  5 sec holds 5x each Push up plus at wall x 10 Plank at wall + shoulder tap (increased soreness with Rt arm reaching across) Manual: STM to right biceps and delotoid for decreased muscle spasms and pain.   08/21/24 Quick DASH see above Red stability ball roll ups at wall x 10  Supine serratus punch 2 x 8 Rt  Supine holds at 90 degrees: small circles each way x 10 each direction Supine shoulder flexion x 10 Rt  Prone rows  2 x 10 Rt  Prone shoulder extension 2 x 10 Rt  Side lying ER 2 x 10 Rt (uncomfortable)  Seated shoulder scaption along plinth 2 x 10 Rt  Seated biceps curls x10 hammer curl x 10 with supination  Seated shoulder pulleys 2' flexion and scaption Seated isometrics 50% effort: flexion,abduction, external rotation, internal rotation  5 sec holds 5x each Push up plus at wall 2 x 10 Plank at wall + shoulder tap (increased soreness) Manual: STM to right biceps and delotoid for decreased muscle spasms and pain.    08/16/24 Attempted seated UE Ranger ROM  but too painful ES IFC 10.5 ma with electrodes front of shoulder, back of shoulder, superior shoulder 25 min  concurrent with exercise: Supine holds at 90 degrees: small circles each way  Supine serratus punch 10x Prone shoulder extension to tolerance x 10  Prone rows  x 10  Prone circles each way 10x each Prone red ball rolls forward and back 10x Side lying ER  x 10 Attempted active elbow flexion but too painful so discontinued Seated isometrics 50% effort: flexion, extension, abduction, external rotation 5 sec holds 5x each Info given on TENS unit pt considering ordering for home use KT tape: 3 strips 50% stretch each; 1st strip distal and proximal anterior shoulder along biceps, 2nd strip posterior deltoids, 3rd strip horizontal mid biceps Pt instructed to monitor skin, she does have mild redness from 1 of the electrodes; instructed to remove tape in 2 days or sooner if there is irritation     PATIENT EDUCATION: Education details: BERDINE, Educated patient on anatomy and physiology of current symptoms, prognosis, plan of care as well as initial self care strategies to promote recovery  Person educated: Patient Education method: Explanation Education comprehension: verbalized understanding  HOME EXERCISE PROGRAM: Access Code: [email or web address] URL: [email or web address] Date: 08/03/2024 Prepared by: Orvil Beuhring  Exercises - Supine Cervical Retraction with Towel  - 2 x daily - 7 x weekly - 1 sets - 10 reps - 10 hold - Supine Cervical Rotation AROM on Pillow  - 2 x daily - 7 x weekly - 1 sets - 5 reps - 10 hold - Seated Scapular Retraction  - 2  x daily - 7 x weekly - 1 sets - 10 reps - Seated Cervical Rotation AROM  - 1 x daily - 7 x weekly - 2 sets - 10 reps - Seated Cervical Sidebending AROM  - 1 x daily - 7 x weekly - 2 sets - 10 reps - Seated Cervical Retraction  - 1 x daily - 7 x weekly - 2 sets - 10 reps - Seated Gripping Towel  - 1 x daily - 7 x weekly - 2 sets - 10 reps - Wrist Flexion Extension AROM - Palms Down  - 1 x daily - 7 x weekly - 2 sets - 10 reps - Closing  and Opening Hand With Shoulder Sling  - 1 x daily - 7 x weekly - 2 sets - 10 reps - Seated Shoulder Pendulum Exercise  - 1 x daily - 7 x weekly - 2 sets - 10 reps - Supine Shoulder Flexion AAROM  - 2 x daily - 7 x weekly - 2 sets - 10 reps - Prone Shoulder Row  - 2 x daily - 7 x weekly - 2 sets - 10 reps - Standing Isometric Shoulder External Rotation with Doorway  - 1 x daily - 7 x weekly - 1 sets - 5 reps - 5 hold - Standing Isometric Shoulder Abduction with Doorway - Arm Bent  - 1 x daily - 7 x weekly - 1 sets - 5 reps - 5 hold - Standing Isometric Shoulder Flexion with Doorway - Arm Bent  - 1 x daily - 7 x weekly - 1 sets - 5 reps - 5 hold - Standing Isometric Shoulder Extension with Doorway - Arm Bent  - 1 x daily - 7 x weekly - 1 sets - 5 reps - 5 hold - Shoulder Flexion Wall Slide with Towel  - 2-3 x daily - 7 x weekly - 2 sets - 10 reps - Seated Shoulder Flexion AAROM with Pulley Behind  - 1 x daily - 7 x weekly - 1 sets - 60-120 reps - Seated Shoulder Scaption AAROM with Pulley at Side  - 1 x daily - 7 x weekly - 1 sets - 60-120 reps ASSESSMENT:  CLINICAL IMPRESSION: Vanice is progressing with ROM in all planes with pain fairly well controlled using the TENS.  Abduction is the most difficult motion with pain with elevation and with lowering.  Treatment interventions focusing on abduction ROM with gravity assisted or active assisted ROM.  Therapist providing verbal cues to optimize technique with  exercises in order to achieve the greatest benefit and to monitor pain response.      Eval: Patient is a 48 y.o. female who was seen today for physical therapy evaluation and treatment 1 week s/p right shoulder scope with rotator cuff repair, biceps tenodesis and subacromial decompression.  The patient would benefit from skilled PT to address shoulder range of motion limitations while following surgical precautions. When appropriate will progress from passive ROM to active assisted to active ROM  and progressive strengthening.  Her current impairments and pain affect ability to perform basic ADLs including personal hygiene, dressing and sleeping and she is unable to work at this time.   OBJECTIVE IMPAIRMENTS: decreased activity tolerance, decreased ROM, decreased strength, increased edema, impaired perceived functional ability, impaired UE functional use, and pain.   ACTIVITY LIMITATIONS: carrying, lifting, sleeping, bed mobility, bathing, toileting, dressing, reach over head, and hygiene/grooming  PARTICIPATION LIMITATIONS: meal prep, cleaning, laundry, driving, shopping, community activity, and occupation  PERSONAL FACTORS: Time since onset of injury/illness/exacerbation are also affecting patient's functional outcome.   REHAB POTENTIAL: Good  CLINICAL DECISION MAKING: Stable/uncomplicated  EVALUATION COMPLEXITY: Low   GOALS: Goals reviewed with patient? Yes  SHORT TERM GOALS: Target date: 08/04/2024     The patient will demonstrate knowledge of basic self care strategies and exercises to promote healing  Baseline: Goal status: met 11/17  2.  The patient will report a 40% improvement in pain levels with functional activities which are currently difficult including sleeping, dressing, bathing Baseline:  Goal status: ONGOING, SLEEPING IN RECLINER 11/17  3.  Passive elevation to 150 degrees needed for overhead reaching Baseline:  Goal status: met 11/17  4.  Quick DASH functional outcome score improved to 70% indicating improved function with less pain Baseline:  Goal status: MET (38.6% ) 08/21/2024      LONG TERM GOALS: Target date: 09/15/2024    The patient will be independent in a safe self progression of a home exercise program to promote further recovery of function  Baseline:  Goal status: INITIAL  2.  The patient will report a 75% improvement in pain levels with functional activities which are currently difficult including sleeping, grooming/dressing, return  to work Baseline:  Goal status: INITIAL  3.  The patient will have improved active shoulder elevation ROM to at least 130 degrees needed for grooming/dressing purposes as well as reaching high shelves  Baseline:  Goal status: INITIAL  4.  The patient will be have shoulder external and internal rotation to 40 degrees for dressing Baseline:  Goal status: INITIAL  5.  The patient will have grossly 4/5 strength needed to lift and lower a 2# object from a high shelf  Baseline:  Goal status: INITIAL  6.  Quick DASH functional outcome score improved to 60% indicating improved function with less pain Baseline:  Goal status: MET (38.6% ) 08/21/2024   PLAN:  PT FREQUENCY: 2x/week  PT DURATION: 12 weeks  PLANNED INTERVENTIONS: 97164- PT Re-evaluation, 97750- Physical Performance Testing, 97110-Therapeutic exercises, 97530- Therapeutic activity, 97112- Neuromuscular re-education, 97535- Self Care, [id number]- Manual therapy, [phone]- Aquatic Therapy, [id number]- Electrical stimulation (unattended), [phone]- Electrical stimulation (manual), 97016- Vasopneumatic device, Patient/Family education, Taping, Joint mobilization, Cryotherapy, and Moist heat  PLAN FOR NEXT SESSION: assess response to treatment session; progress as tolerated; may need a slower progression of active assisted to active ROM secondary to pain; scapular stabilization glenohumeral and scapular mobilization  Glade Pesa, PT 08/31/2024 5:07 PM Phone: [phone] Fax: [phone]  [facility] [address] [place], [place] [address] Phone # [phone] Fax [phone]

## 2024-09-01 ENCOUNTER — Other Ambulatory Visit: Payer: Self-pay | Admitting: Physician Assistant

## 2024-09-01 ENCOUNTER — Other Ambulatory Visit: Payer: Self-pay | Admitting: Nurse Practitioner

## 2024-09-01 ENCOUNTER — Other Ambulatory Visit: Payer: Self-pay

## 2024-09-01 DIAGNOSIS — R7303 Prediabetes: Secondary | ICD-10-CM

## 2024-09-01 MED ORDER — METFORMIN HCL 500 MG PO TABS
500.0000 mg | ORAL_TABLET | Freq: Every day | ORAL | 0 refills | Status: AC
  Filled 2024-09-01: qty 90, 90d supply, fill #0

## 2024-09-01 NOTE — Telephone Encounter (Signed)
 Has an appt in February.

## 2024-09-03 MED ORDER — PRAZOSIN HCL 5 MG PO CAPS
10.0000 mg | ORAL_CAPSULE | Freq: Every day | ORAL | 0 refills | Status: DC
  Filled 2024-09-03: qty 180, 90d supply, fill #0

## 2024-09-04 ENCOUNTER — Ambulatory Visit: Admitting: Physical Therapy

## 2024-09-04 ENCOUNTER — Other Ambulatory Visit (HOSPITAL_COMMUNITY): Payer: Self-pay

## 2024-09-04 ENCOUNTER — Other Ambulatory Visit: Payer: Self-pay

## 2024-09-04 ENCOUNTER — Encounter: Payer: Self-pay | Admitting: Physical Therapy

## 2024-09-04 DIAGNOSIS — M25611 Stiffness of right shoulder, not elsewhere classified: Secondary | ICD-10-CM

## 2024-09-04 DIAGNOSIS — R293 Abnormal posture: Secondary | ICD-10-CM

## 2024-09-04 DIAGNOSIS — M25511 Pain in right shoulder: Secondary | ICD-10-CM

## 2024-09-04 DIAGNOSIS — M6281 Muscle weakness (generalized): Secondary | ICD-10-CM

## 2024-09-04 NOTE — Therapy (Signed)
 " OUTPATIENT PHYSICAL THERAPY SHOULDER TREATMENT   Patient Name: Caroline Gonzales MRN: [id number] DOB:December 14, 1975, 48 y.o., female Today's Date: 09/04/2024  END OF SESSION:  PT End of Session - 09/04/24 1612     Visit Number 19    Date for Recertification  09/15/24    Authorization Type [employer]    PT Start Time 1617    PT Stop Time 1656    PT Time Calculation (min) 39 min    Activity Tolerance Patient tolerated treatment well    Behavior During Therapy [facility] for tasks assessed/performed                   Past Medical History:  Diagnosis Date   Acute costochondritis 11/14/2021   Allergy    Zyrtec .   Anxiety    followed by Dr. Melba   Back pain    Complication of anesthesia    PONV   Depression    Elevated blood pressure reading without diagnosis of hypertension 12/29/2021   Fibroid    Gastroparesis 09/14/2012   gastric emptying study in 2014   GERD (gastroesophageal reflux disease)    Headache    Hypertriglyceridemia 10/07/2017   Nightmares 07/21/2018   Pneumonia    2013ish   PONV (postoperative nausea and vomiting)     likes scopolamine  patch and zofran  /phenergan  helps   Pre-diabetes    PTSD (post-traumatic stress disorder)    Residual foreign body in soft tissue 11/12/2016   Screening for colon cancer 07/02/2022   Sleep apnea 07/01/2018   cpap optional, pt close to not use cpap   Vitamin D  deficiency    Wears glasses    Past Surgical History:  Procedure Laterality Date   ABDOMINAL HYSTERECTOMY  01/2021   ANKLE ARTHROSCOPY Left 2011   BICEPT TENODESIS Right 06/16/2024   Procedure: TENODESIS, BICEPS;  Surgeon: Sharl Selinda Dover, MD;  Location: [facility];  Service: Orthopedics;  Laterality: Right;   CESAREAN SECTION  06/2006   x 1   CHOLECYSTECTOMY  07/2006   laparoscopic   CYSTOSCOPY N/A 02/04/2021   Procedure: CYSTOSCOPY;  Surgeon: Jannis Kate Norris, MD;  Location: [facility] OR;  Service: Gynecology;  Laterality: N/A;   DILATION  AND CURETTAGE OF UTERUS  1997   x 2   FOREIGN BODY REMOVAL Left 11/18/2016   Procedure: REMOVAL FOREIGN BODY EXTREMITY LEFT FOOT;  Surgeon: Donnice JONELLE Fees, DPM;  Location: [facility] OR;  Service: Podiatry;  Laterality: Left;   GASTRIC ROUX-EN-Y N/A 04/13/2023   Procedure: LAPAROSCOPIC ROUX-EN-Y GASTRIC BYPASS WITH UPPER ENDOSCOPY;  Surgeon: Signe Mitzie LABOR, MD;  Location: [facility]S;  Service: General;  Laterality: N/A;   KNEE ARTHROSCOPY WITH MEDIAL MENISECTOMY Right 09/10/2021   Procedure: KNEE ARTHROSCOPY WITH MEDIAL MENISCAL ROOT REPAIR;  Surgeon: Cristy Bonner DASEN, MD;  Location: [facility];  Service: Orthopedics;  Laterality: Right;   PILONIDAL CYST EXCISION  1990's   RADIOLOGY WITH ANESTHESIA N/A 11/10/2018   Procedure: MRI WITH ANESTHESIA OF BRAIN AND ORBITS WITH AND WITHOUT CONTRAST;  Surgeon: Radiologist, Medication, MD;  Location: [facility] OR;  Service: Radiology;  Laterality: N/A;   SHOULDER ARTHROSCOPY WITH ROTATOR CUFF REPAIR Right 06/16/2024   Procedure: ARTHROSCOPY, SHOULDER, WITH ROTATOR CUFF REPAIR;  Surgeon: Sharl Selinda Dover, MD;  Location: [facility];  Service: Orthopedics;  Laterality: Right;   SUBACROMIAL DECOMPRESSION Right 06/16/2024   Procedure: DECOMPRESSION, SUBACROMIAL SPACE;  Surgeon: Sharl Selinda Dover, MD;  Location:  SURGERY CENTER;  Service: Orthopedics;  Laterality: Right;  Right shoulder arthroscopy with rotator cuff repair, biceps tenodesis, subacromial decompression, distal clavicle excision   TENDON REPAIR Left 2011   Left Ankle   TOTAL LAPAROSCOPIC HYSTERECTOMY WITH SALPINGECTOMY Bilateral 02/04/2021   Procedure: TOTAL LAPAROSCOPIC HYSTERECTOMY WITH SALPINGECTOMY;  Surgeon: Jannis Kate Norris, MD;  Location: [facility] OR;  Service: Gynecology;  Laterality: Bilateral;   UPPER GASTROINTESTINAL ENDOSCOPY     WISDOM TOOTH EXTRACTION  1990's   Patient Active Problem List   Diagnosis Date Noted   History of nocturnal hypoglycemia  03/02/2024   Encounter for annual physical exam 03/02/2024   Family history of elevated lipoprotein (a) 04/26/2023   PVC's (premature ventricular contractions) 04/26/2023   Morbid obesity (HCC) 04/13/2023   Gastroesophageal reflux disease 12/24/2022   Abnormal laboratory test 12/24/2022   Chest wall pain 12/24/2022   Change in facial mole 12/29/2021   S/P laparoscopic hysterectomy 02/04/2021   PTSD (post-traumatic stress disorder) 07/21/2018   GAD (generalized anxiety disorder) 07/21/2018   Insomnia 07/21/2018   Recurrent major depressive disorder, in partial remission 07/21/2018   Insulin  resistance 10/21/2017   Hypertriglyceridemia 10/07/2017   Vitamin D  deficiency 10/07/2017   Pre-diabetes 11/02/2016   Morbid obesity with body mass index (BMI) of 50.0 to 59.9 in adult (HCC) 10/15/2016    PCP: Oris Credit NP  REFERRING PROVIDER: Sharl Mayo MD  REFERRING DIAG: s/p right shoulder scope with rotator cuff repair, biceps tenodesis and subacromial decompression  THERAPY DIAG: right shoulder pain; right shoulder stiffness; weakness   Rationale for Evaluation and Treatment: Rehabilitation  ONSET DATE: Spring 2025  SUBJECTIVE:                                                                                                                                                                                      SUBJECTIVE STATEMENT: I was able to scrub the stove without even thinking about it.    Hand dominance: Right  Date of surgery 06/16/2024 s/p right shoulder scope with rotator cuff repair, biceps tenodesis and subacromial decompression Pt reports that MD advised her that she could sleep with sling off with pillow support and that she may have her arm straight with support A/ROM to begin at 6 weeks  PERTINENT HISTORY: Right shoulder scope with rotator cuff repair, biceps tenodesis, and subacromial decompression on 06/16/24 Knee scope 12/29/23 Can't take NSAIDS b/c of gastric  bypass  PAIN:  09/04/2024  Are you having pain? Yes NPRS scale: 0/10 with TENS on right upper arm  Pain location: right elbow - achy comes and goes - located at lateral epicondyle, right shoulder achy Pain orientation: Right  PAIN TYPE: aching Pain description: constant  Aggravating  factors: repetition,  Relieving factors: medication   PRECAUTIONS: Shoulder surgical   WEIGHT BEARING RESTRICTIONS: No  FALLS:  Has patient fallen in last 6 months? 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  PLOF: Independent  PATIENT GOALS:baseline be 75% ; shopping, goes to [employer] stretching class in the [place]   NEXT MD VISIT:   OBJECTIVE:  Note: Objective measures were completed at Evaluation unless otherwise noted.  DIAGNOSTIC FINDINGS:  Full thickness tear of subscapularis and supraspinatus and biceps tear  PATIENT SURVEYS:  Quick Dash:  QUICK DASH  Please rate your ability do the following activities in the last week by selecting the number below the appropriate response.   Activities Rating  Open a tight or new jar.  5 = Unable  Do heavy household chores (e.g., wash walls, floors). 5 = Unable  Carry a shopping bag or briefcase 5 = Unable  Wash your back. 5 = Unable  Use a knife to cut food. 5 = Unable  Recreational activities in which you take some force or impact through your arm, shoulder or hand (e.g., golf, hammering, tennis, etc.). 5 = Unable  During the past week, to what extent has your arm, shoulder or hand problem interfered with your normal social activities with family, friends, neighbors or groups?  4 = Quite a bit  During the past week, were you limited in your work or other regular daily activities as a result of your arm, shoulder or hand problem? 5 = Unable  Rate the severity of the following symptoms in the last  week: Arm, Shoulder, or hand pain. 4 = Severe  Rate the severity of the following symptoms in the last week: Tingling (pins and needles) in your arm, shoulder or hand. 1 = none  During the past week, how much difficulty have you had sleeping because of the pain in your arm, shoulder or hand?  3 = Moderate difficulty   (A QuickDASH score may not be calculated if there is greater than 1 missing item.)  Quick Dash Disability/Symptom Score: 81.8%   Minimally Clinically Important Difference (MCID): 15-20 points    08/21/2024 Quick DASH : 38.6/100 38.6%   COGNITION: Overall cognitive status: Within functional limits for tasks assessed     SENSATION: Denies numbness/tingling  APPEARANCE:  3 portal incisions with steri strips; no redness; no drainage; amount of edema consistent with 1 week post op    UPPER EXTREMITY ROM:  Passive ROM Right eval   Shoulder flexion 15   Shoulder extension    Shoulder abduction 10   Shoulder adduction    Shoulder extension    Shoulder internal rotation 45   Shoulder external rotation 0   Elbow flexion    Elbow extension    Wrist flexion    Wrist extension    Wrist ulnar deviation    Wrist radial deviation    Wrist pronation    Wrist supination     (Blank rows = not tested)   Active ROM Right eval Left eval 12/18  Shoulder flexion Not tested secondary to surgical precautions WFLS 153  Shoulder extension     Shoulder abduction  WFLS 135 painful   Shoulder adduction     Shoulder internal rotation  WFLs Thumb to Coccyx   Shoulder external rotation  WFLs 78  Elbow flexion     Elbow extension  Wrist flexion     Wrist extension     Wrist ulnar deviation     Wrist radial deviation     Wrist pronation     Wrist supination     (Blank rows = not tested)  UPPER EXTREMITY MMT:  MMT Right eval Left eval 12/18 R  Shoulder flexion Not tested secondary to surgical precautions 5 3+  Shoulder extension  5   Shoulder abduction  5 3-   Shoulder adduction     Shoulder internal rotation  5 4  Shoulder external rotation  5 4  Middle trapezius     Lower trapezius     Elbow flexion  5   Elbow extension  5   Wrist flexion  5   Wrist extension  5   Wrist ulnar deviation     Wrist radial deviation     Wrist pronation     Wrist supination     Grip strength (lbs)     (Blank rows = not tested)                                                                                                                              TREATMENT DATE:  09/04/24 Pt wearing TENS for duration of session UE Ranger on wall mid level: flexion 10x, scaption 10x Red stability ball roll ups at wall x 10  IR with strap 5 reps max hold Doorway slide for ABD x 10 Supine serratus punch 2# 10x  Supine arm at 90 degrees 1#: ABCs, circles, 12:00/6:00 and 9:00/3:00 Sidelying arm at 90 degrees: no weight circles, 12:00/6:00 and 9:00/3:00 SL ABD x 10 SL horizontal ADD/ABD x 10 Prone rows 2#  2 x 10 Rt  Prone shoulder extension 2# 2 x 10 Rt  Prone horizontal abduction approx 30 degrees 10x (small ROM) Yellow band internal and external rotation  10x each Yellow band isometric holds with side stepping (3 steps) 10x each way  08/31/24 Pt wearing TENS for duration of session Discussion on techniques for internal rotation counter top assist Shoulder ROM as above Supine serratus punch 2# 10x  Supine arm at 90 degrees 1#: ABCs, circles, 12:00/6:00 and 9:00/3:00 Sidelying arm at 90 degrees: no weight circles, 12:00/6:00 and 9:00/3:00 Prone rows 2#  2 x 10 Rt  Prone shoulder extension 2# 2 x 10 Rt  Prone horizontal abduction approx 30 degrees 10x (small ROM) Prone scaption 20 degrees 10x (very small ROM) UE Ranger on wall mid level: flexion 10x, scaption 10x Yellow band internal and external rotation mid ROM 10x each Yellow band isometric holds with side stepping (3 steps) 5x each way   08/28/24  Red stability ball roll ups at wall x 10  Push up plus  at wall x 10 Plank at wall + shoulder tap (increased soreness with Rt arm reaching across) Yellow band isometric walkouts: yellow x 10 ea dir at 90 deg elbow flex Supine serratus punch 2# 2 x  10 Rt  Supine holds at 90 degrees: 2# small circles each way x 10 each direction Supine shoulder flexion x 10 Rt  Prone rows 2#  2 x 10 Rt  Prone shoulder extension 2# 2 x 10 Rt  Side lying ER 2 x 10 Rt (uncomfortable) attempted weight but too difficult Seated biceps curls 2x10 hammer curl 2# 2x10 with supination  Wall ladder into ABD x 2 (good going up, painful coming down) - tried various AA ABD - door, stairs, standing dowel, SL and supine. Doorway slide was the most comfortable.  Discussed resting her shoulder to help with pain in addition to ice and isometrics    08/23/24   Red stability ball roll ups at wall x 10  Supine serratus punch 2 x 10 Rt  Supine holds at 90 degrees: small circles each way x 10 each direction Supine shoulder flexion x 10 Rt  Prone rows  2 x 10 Rt  Prone shoulder extension 2 x 10 Rt  Side lying ER 2 x 10 Rt (uncomfortable)  Seated shoulder scaption along plinth 2 x 10 Rt  Seated biceps curls 2x10 hammer curl 2x10 with supination  Seated shoulder pulleys 2' flexion and scaption each  Seated isometrics 50% effort: flexion,abduction, external rotation, internal rotation  5 sec holds 5x each Push up plus at wall x 10 Plank at wall + shoulder tap (increased soreness with Rt arm reaching across) Manual: STM to right biceps and delotoid for decreased muscle spasms and pain.   08/21/24 Quick DASH see above Red stability ball roll ups at wall x 10  Supine serratus punch 2 x 8 Rt  Supine holds at 90 degrees: small circles each way x 10 each direction Supine shoulder flexion x 10 Rt  Prone rows  2 x 10 Rt  Prone shoulder extension 2 x 10 Rt  Side lying ER 2 x 10 Rt (uncomfortable)  Seated shoulder scaption along plinth 2 x 10 Rt  Seated biceps curls x10 hammer curl x  10 with supination  Seated shoulder pulleys 2' flexion and scaption Seated isometrics 50% effort: flexion,abduction, external rotation, internal rotation  5 sec holds 5x each Push up plus at wall 2 x 10 Plank at wall + shoulder tap (increased soreness) Manual: STM to right biceps and delotoid for decreased muscle spasms and pain.    PATIENT EDUCATION: Education details: ENGINEER, BUILDING SERVICES, Educated patient on anatomy and physiology of current symptoms, prognosis, plan of care as well as initial self care strategies to promote recovery  Person educated: Patient Education method: Explanation Education comprehension: verbalized understanding  HOME EXERCISE PROGRAM: Access Code: [email or web address] URL: [email or web address] Date: 09/04/2024 Prepared by: Mliss  Exercises - Supine Cervical Retraction with Towel  - 2 x daily - 7 x weekly - 1 sets - 10 reps - 10 hold - Supine Cervical Rotation AROM on Pillow  - 2 x daily - 7 x weekly - 1 sets - 5 reps - 10 hold - Seated Scapular Retraction  - 2 x daily - 7 x weekly - 1 sets - 10 reps - Seated Cervical Rotation AROM  - 1 x daily - 7 x weekly - 2 sets - 10 reps - Seated Cervical Sidebending AROM  - 1 x daily - 7 x weekly - 2 sets - 10 reps - Seated Cervical Retraction  - 1 x daily - 7 x weekly - 2 sets - 10 reps - Seated Gripping Towel  - 1 x daily - 7 x weekly -  2 sets - 10 reps - Wrist Flexion Extension AROM - Palms Down  - 1 x daily - 7 x weekly - 2 sets - 10 reps - Closing and Opening Hand With Shoulder Sling  - 1 x daily - 7 x weekly - 2 sets - 10 reps - Seated Shoulder Pendulum Exercise  - 1 x daily - 7 x weekly - 2 sets - 10 reps - Supine Shoulder Flexion AAROM  - 2 x daily - 7 x weekly - 2 sets - 10 reps - Prone Shoulder Row  - 2 x daily - 7 x weekly - 2 sets - 10 reps - Standing Isometric Shoulder External Rotation with Doorway  - 1 x daily - 7 x weekly - 1 sets - 5 reps - 5 hold - Standing Isometric Shoulder Abduction with Doorway - Arm  Bent  - 1 x daily - 7 x weekly - 1 sets - 5 reps - 5 hold - Standing Isometric Shoulder Flexion with Doorway - Arm Bent  - 1 x daily - 7 x weekly - 1 sets - 5 reps - 5 hold - Standing Isometric Shoulder Extension with Doorway - Arm Bent  - 1 x daily - 7 x weekly - 1 sets - 5 reps - 5 hold - Shoulder Flexion Wall Slide with Towel  - 2-3 x daily - 7 x weekly - 2 sets - 10 reps - Seated Shoulder Flexion AAROM with Pulley Behind  - 1 x daily - 7 x weekly - 1 sets - 60-120 reps - Seated Shoulder Scaption AAROM with Pulley at Side  - 1 x daily - 7 x weekly - 1 sets - 60-120 reps - Shoulder External Rotation Reactive Isometrics (Mirrored)  - 1 x daily - 3 x weekly - 1-3 sets - 10 reps - Shoulder Internal Rotation Reactive Isometrics  - 1 x daily - 3 x weekly - 1-3 sets - 10 reps - Standing Shoulder Internal Rotation with Anchored Resistance (Mirrored)  - 1 x daily - 3 x weekly - 1-3 sets - 10 reps - Shoulder External Rotation with Anchored Resistance (Mirrored)  - 1 x daily - 3 x weekly - 1-3 sets - 10 reps - Prone Shoulder Row  - 1 x daily - 3 x weekly - 1-3 sets - 10 reps - Prone Shoulder Extension - Single Arm with Dumbbell  - 1 x daily - 3-4 x weekly - 1-3 sets - 10 reps ASSESSMENT:  CLINICAL IMPRESSION: Caroline Gonzales reports increased function over the weekend. She was able to scrub the stove without pain and no TENS unit. She demonstrates significant improvement in both IR and ER strength and also with IR ROM.Her main pain today was with with scaption activities and prone horizontal ABD.  She's demonstrating better muscular control with therex overall.     Eval: Patient is a 48 y.o. female who was seen today for physical therapy evaluation and treatment 1 week s/p right shoulder scope with rotator cuff repair, biceps tenodesis and subacromial decompression.  The patient would benefit from skilled PT to address shoulder range of motion limitations while following surgical precautions. When appropriate  will progress from passive ROM to active assisted to active ROM and progressive strengthening.  Her current impairments and pain affect ability to perform basic ADLs including personal hygiene, dressing and sleeping and she is unable to work at this time.   OBJECTIVE IMPAIRMENTS: decreased activity tolerance, decreased ROM, decreased strength, increased edema, impaired perceived functional ability, impaired  UE functional use, and pain.   ACTIVITY LIMITATIONS: carrying, lifting, sleeping, bed mobility, bathing, toileting, dressing, reach over head, and hygiene/grooming  PARTICIPATION LIMITATIONS: meal prep, cleaning, laundry, driving, shopping, community activity, and occupation  PERSONAL FACTORS: Time since onset of injury/illness/exacerbation are also affecting patient's functional outcome.   REHAB POTENTIAL: Good  CLINICAL DECISION MAKING: Stable/uncomplicated  EVALUATION COMPLEXITY: Low   GOALS: Goals reviewed with patient? Yes  SHORT TERM GOALS: Target date: 08/04/2024     The patient will demonstrate knowledge of basic self care strategies and exercises to promote healing  Baseline: Goal status: met 11/17  2.  The patient will report a 40% improvement in pain levels with functional activities which are currently difficult including sleeping, dressing, bathing Baseline:  Goal status: ONGOING, SLEEPING IN RECLINER 11/17  3.  Passive elevation to 150 degrees needed for overhead reaching Baseline:  Goal status: met 11/17  4.  Quick DASH functional outcome score improved to 70% indicating improved function with less pain Baseline:  Goal status: MET (38.6% ) 08/21/2024      LONG TERM GOALS: Target date: 09/15/2024    The patient will be independent in a safe self progression of a home exercise program to promote further recovery of function  Baseline:  Goal status: INITIAL  2.  The patient will report a 75% improvement in pain levels with functional activities which are  currently difficult including sleeping, grooming/dressing, return to work Baseline:  Goal status: INITIAL  3.  The patient will have improved active shoulder elevation ROM to at least 130 degrees needed for grooming/dressing purposes as well as reaching high shelves  Baseline:  Goal status: INITIAL  4.  The patient will be have shoulder external and internal rotation to 40 degrees for dressing Baseline:  Goal status: INITIAL  5.  The patient will have grossly 4/5 strength needed to lift and lower a 2# object from a high shelf  Baseline:  Goal status: INITIAL  6.  Quick DASH functional outcome score improved to 60% indicating improved function with less pain Baseline:  Goal status: MET (38.6% ) 08/21/2024   PLAN:  PT FREQUENCY: 2x/week  PT DURATION: 12 weeks  PLANNED INTERVENTIONS: 97164- PT Re-evaluation, 97750- Physical Performance Testing, 97110-Therapeutic exercises, 97530- Therapeutic activity, 97112- Neuromuscular re-education, 97535- Self Care, [id number]- Manual therapy, [phone]- Aquatic Therapy, [id number]- Electrical stimulation (unattended), [phone]- Electrical stimulation (manual), 97016- Vasopneumatic device, Patient/Family education, Taping, Joint mobilization, Cryotherapy, and Moist heat  PLAN FOR NEXT SESSION: assess response to treatment session; progress as tolerated; may need a slower progression of active assisted to active ROM secondary to pain; scapular stabilization glenohumeral and scapular mobilization  Mliss Cummins, PT  09/04/2024 4:59 PM Phone: [phone] Fax: [phone]  [facility] [address], [address] [place], [place] [address] Phone # [phone] Fax [phone]  "

## 2024-09-05 ENCOUNTER — Other Ambulatory Visit: Payer: Self-pay

## 2024-09-06 ENCOUNTER — Encounter: Payer: Self-pay | Admitting: Physical Therapy

## 2024-09-06 ENCOUNTER — Ambulatory Visit: Admitting: Physical Therapy

## 2024-09-06 DIAGNOSIS — M25511 Pain in right shoulder: Secondary | ICD-10-CM | POA: Diagnosis not present

## 2024-09-06 DIAGNOSIS — M25611 Stiffness of right shoulder, not elsewhere classified: Secondary | ICD-10-CM

## 2024-09-06 DIAGNOSIS — M6281 Muscle weakness (generalized): Secondary | ICD-10-CM

## 2024-09-06 NOTE — Therapy (Signed)
 " OUTPATIENT PHYSICAL THERAPY SHOULDER TREATMENT   Patient Name: Caroline Gonzales MRN: [id number] DOB:1976-03-27, 48 y.o., female Today's Date: 09/06/2024  END OF SESSION:  PT End of Session - 09/06/24 0758     Visit Number 20    Date for Recertification  09/15/24    Authorization Type [employer]    PT Start Time 0758    PT Stop Time 0837    PT Time Calculation (min) 39 min    Activity Tolerance Patient tolerated treatment well                   Past Medical History:  Diagnosis Date   Acute costochondritis 11/14/2021   Allergy    Zyrtec .   Anxiety    followed by Dr. Melba   Back pain    Complication of anesthesia    PONV   Depression    Elevated blood pressure reading without diagnosis of hypertension 12/29/2021   Fibroid    Gastroparesis 09/14/2012   gastric emptying study in 2014   GERD (gastroesophageal reflux disease)    Headache    Hypertriglyceridemia 10/07/2017   Nightmares 07/21/2018   Pneumonia    2013ish   PONV (postoperative nausea and vomiting)     likes scopolamine  patch and zofran  /phenergan  helps   Pre-diabetes    PTSD (post-traumatic stress disorder)    Residual foreign body in soft tissue 11/12/2016   Screening for colon cancer 07/02/2022   Sleep apnea 07/01/2018   cpap optional, pt close to not use cpap   Vitamin D  deficiency    Wears glasses    Past Surgical History:  Procedure Laterality Date   ABDOMINAL HYSTERECTOMY  01/2021   ANKLE ARTHROSCOPY Left 2011   BICEPT TENODESIS Right 06/16/2024   Procedure: TENODESIS, BICEPS;  Surgeon: Sharl Selinda Dover, MD;  Location: [facility];  Service: Orthopedics;  Laterality: Right;   CESAREAN SECTION  06/2006   x 1   CHOLECYSTECTOMY  07/2006   laparoscopic   CYSTOSCOPY N/A 02/04/2021   Procedure: CYSTOSCOPY;  Surgeon: Jannis Kate Norris, MD;  Location: [facility] OR;  Service: Gynecology;  Laterality: N/A;   DILATION AND CURETTAGE OF UTERUS  1997   x 2   FOREIGN BODY REMOVAL  Left 11/18/2016   Procedure: REMOVAL FOREIGN BODY EXTREMITY LEFT FOOT;  Surgeon: Donnice JONELLE Fees, DPM;  Location: [facility] OR;  Service: Podiatry;  Laterality: Left;   GASTRIC ROUX-EN-Y N/A 04/13/2023   Procedure: LAPAROSCOPIC ROUX-EN-Y GASTRIC BYPASS WITH UPPER ENDOSCOPY;  Surgeon: Signe Mitzie LABOR, MD;  Location: [facility];  Service: General;  Laterality: N/A;   KNEE ARTHROSCOPY WITH MEDIAL MENISECTOMY Right 09/10/2021   Procedure: KNEE ARTHROSCOPY WITH MEDIAL MENISCAL ROOT REPAIR;  Surgeon: Cristy Bonner DASEN, MD;  Location: [facility];  Service: Orthopedics;  Laterality: Right;   PILONIDAL CYST EXCISION  1990's   RADIOLOGY WITH ANESTHESIA N/A 11/10/2018   Procedure: MRI WITH ANESTHESIA OF BRAIN AND ORBITS WITH AND WITHOUT CONTRAST;  Surgeon: Radiologist, Medication, MD;  Location: [facility] OR;  Service: Radiology;  Laterality: N/A;   SHOULDER ARTHROSCOPY WITH ROTATOR CUFF REPAIR Right 06/16/2024   Procedure: ARTHROSCOPY, SHOULDER, WITH ROTATOR CUFF REPAIR;  Surgeon: Sharl Selinda Dover, MD;  Location: [facility];  Service: Orthopedics;  Laterality: Right;   SUBACROMIAL DECOMPRESSION Right 06/16/2024   Procedure: DECOMPRESSION, SUBACROMIAL SPACE;  Surgeon: Sharl Selinda Dover, MD;  Location: [facility];  Service: Orthopedics;  Laterality: Right;  Right shoulder arthroscopy with rotator cuff repair,  biceps tenodesis, subacromial decompression, distal clavicle excision   TENDON REPAIR Left 2011   Left Ankle   TOTAL LAPAROSCOPIC HYSTERECTOMY WITH SALPINGECTOMY Bilateral 02/04/2021   Procedure: TOTAL LAPAROSCOPIC HYSTERECTOMY WITH SALPINGECTOMY;  Surgeon: Jannis Kate Norris, MD;  Location: [facility] OR;  Service: Gynecology;  Laterality: Bilateral;   UPPER GASTROINTESTINAL ENDOSCOPY     WISDOM TOOTH EXTRACTION  1990's   Patient Active Problem List   Diagnosis Date Noted   History of nocturnal hypoglycemia 03/02/2024   Encounter for annual physical exam 03/02/2024    Family history of elevated lipoprotein (a) 04/26/2023   PVC's (premature ventricular contractions) 04/26/2023   Morbid obesity (HCC) 04/13/2023   Gastroesophageal reflux disease 12/24/2022   Abnormal laboratory test 12/24/2022   Chest wall pain 12/24/2022   Change in facial mole 12/29/2021   S/P laparoscopic hysterectomy 02/04/2021   PTSD (post-traumatic stress disorder) 07/21/2018   GAD (generalized anxiety disorder) 07/21/2018   Insomnia 07/21/2018   Recurrent major depressive disorder, in partial remission 07/21/2018   Insulin  resistance 10/21/2017   Hypertriglyceridemia 10/07/2017   Vitamin D  deficiency 10/07/2017   Pre-diabetes 11/02/2016   Morbid obesity with body mass index (BMI) of 50.0 to 59.9 in adult (HCC) 10/15/2016    PCP: Oris Credit NP  REFERRING PROVIDER: Sharl Mayo MD  REFERRING DIAG: s/p right shoulder scope with rotator cuff repair, biceps tenodesis and subacromial decompression  THERAPY DIAG: right shoulder pain; right shoulder stiffness; weakness   Rationale for Evaluation and Treatment: Rehabilitation  ONSET DATE: Spring 2025  SUBJECTIVE:                                                                                                                                                                                      SUBJECTIVE STATEMENT: I don't have my TENs unit today, I overslept.    Hand dominance: Right  Date of surgery 06/16/2024 s/p right shoulder scope with rotator cuff repair, biceps tenodesis and subacromial decompression Pt reports that MD advised her that she could sleep with sling off with pillow support and that she may have her arm straight with support A/ROM to begin at 6 weeks  PERTINENT HISTORY: Right shoulder scope with rotator cuff repair, biceps tenodesis, and subacromial decompression on 06/16/24 Knee scope 12/29/23 Can't take NSAIDS b/c of gastric bypass  PAIN:  09/06/2024  Are you having pain? Yes NPRS scale: 3/10  Pain  location: right elbow - achy comes and goes - located at lateral epicondyle, right shoulder achy Pain orientation: Right  PAIN TYPE: aching Pain description: constant  Aggravating factors: repetition,  Relieving factors: medication   PRECAUTIONS: Shoulder surgical   WEIGHT BEARING RESTRICTIONS: No  FALLS:  Has patient fallen in last 6 months? 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  PLOF: Independent  PATIENT GOALS:baseline be 75% ; shopping, goes to [employer] stretching class in the [place]   NEXT MD VISIT:   OBJECTIVE:  Note: Objective measures were completed at Evaluation unless otherwise noted.  DIAGNOSTIC FINDINGS:  Full thickness tear of subscapularis and supraspinatus and biceps tear  PATIENT SURVEYS:  Quick Dash:  QUICK DASH  Please rate your ability do the following activities in the last week by selecting the number below the appropriate response.   Activities Rating  Open a tight or new jar.  5 = Unable  Do heavy household chores (e.g., wash walls, floors). 5 = Unable  Carry a shopping bag or briefcase 5 = Unable  Wash your back. 5 = Unable  Use a knife to cut food. 5 = Unable  Recreational activities in which you take some force or impact through your arm, shoulder or hand (e.g., golf, hammering, tennis, etc.). 5 = Unable  During the past week, to what extent has your arm, shoulder or hand problem interfered with your normal social activities with family, friends, neighbors or groups?  4 = Quite a bit  During the past week, were you limited in your work or other regular daily activities as a result of your arm, shoulder or hand problem? 5 = Unable  Rate the severity of the following symptoms in the last week: Arm, Shoulder, or hand pain. 4 = Severe  Rate the severity of the following symptoms in the last week:  Tingling (pins and needles) in your arm, shoulder or hand. 1 = none  During the past week, how much difficulty have you had sleeping because of the pain in your arm, shoulder or hand?  3 = Moderate difficulty   (A QuickDASH score may not be calculated if there is greater than 1 missing item.)  Quick Dash Disability/Symptom Score: 81.8%   Minimally Clinically Important Difference (MCID): 15-20 points    08/21/2024 Quick DASH : 38.6/100 38.6%   COGNITION: Overall cognitive status: Within functional limits for tasks assessed     SENSATION: Denies numbness/tingling  APPEARANCE:  3 portal incisions with steri strips; no redness; no drainage; amount of edema consistent with 1 week post op    UPPER EXTREMITY ROM:  Passive ROM Right eval   Shoulder flexion 15   Shoulder extension    Shoulder abduction 10   Shoulder adduction    Shoulder extension    Shoulder internal rotation 45   Shoulder external rotation 0   Elbow flexion    Elbow extension    Wrist flexion    Wrist extension    Wrist ulnar deviation    Wrist radial deviation    Wrist pronation    Wrist supination     (Blank rows = not tested)   Active ROM Right eval Left eval 12/18 12/24  Shoulder flexion Not tested secondary to surgical precautions WFLS 153 161  Shoulder extension      Shoulder abduction  WFLS 135 painful  150  Shoulder adduction      Shoulder internal rotation  WFLs Thumb to Coccyx    Shoulder external rotation  WFLs 78   Elbow flexion      Elbow extension      Wrist flexion      Wrist extension  Wrist ulnar deviation      Wrist radial deviation      Wrist pronation      Wrist supination      (Blank rows = not tested)  UPPER EXTREMITY MMT:  MMT Right eval Left eval 12/18 R  Shoulder flexion Not tested secondary to surgical precautions 5 3+  Shoulder extension  5   Shoulder abduction  5 3-  Shoulder adduction     Shoulder internal rotation  5 4  Shoulder external rotation  5 4   Middle trapezius     Lower trapezius     Elbow flexion  5   Elbow extension  5   Wrist flexion  5   Wrist extension  5   Wrist ulnar deviation     Wrist radial deviation     Wrist pronation     Wrist supination     Grip strength (lbs)     (Blank rows = not tested)                                                                                                                              TREATMENT DATE:  09/06/24 ES IFC 11.0 ma  to right shoulder for duration of session for pain control Prone rows 2#  2 x 10 Rt  Prone shoulder extension 2# 2 x 10 Rt  Prone horizontal abduction approx 30 degrees 10x (small ROM) Incline 30 degrees table: Supine serratus punch 2# 10x  Supine shoulder flexion 10x; added 1# 5x (challenging and painful, modified with bent elbow and fewer reps)  Supine arm at 90 degrees 1#: ABCs, circles, 12:00/6:00 and 9:00/3:00 Sidelying: Shoulder abduction 5x (painful) Sidelying arm at 90 degrees: no weight circles, 12:00/6:00 and 9:00/3:00 Sidelying external rotation 10x no weight Standing: Wall push ups 10x Purple ball on wall circles 10x each way Attempted ball on wall and turn body but too painful Ball on wall shoulder abduction circles 5x (painful) External rotation (elbow at side) 10x yellow band Shoulder extension red band 10x 2 (anchored over the top of the door) Shoulder internal rotation 10x Pain 4-5/10 with TENS on In the doorway shoulder abduction up the door 10x (most comfortable position for abduction)  09/04/24 Pt wearing TENS for duration of session UE Ranger on wall mid level: flexion 10x, scaption 10x Red stability ball roll ups at wall x 10  IR with strap 5 reps max hold Doorway slide for ABD x 10 Supine serratus punch 2# 10x  Supine arm at 90 degrees 1#: ABCs, circles, 12:00/6:00 and 9:00/3:00 Sidelying arm at 90 degrees: no weight circles, 12:00/6:00 and 9:00/3:00 SL ABD x 10 SL horizontal ADD/ABD x 10 Prone rows 2#  2 x 10 Rt   Prone shoulder extension 2# 2 x 10 Rt  Prone horizontal abduction approx 30 degrees 10x (small ROM) Yellow band internal and external rotation  10x each Yellow band isometric holds with side stepping (3 steps) 10x each  way  08/31/24 Pt wearing TENS for duration of session Discussion on techniques for internal rotation counter top assist Shoulder ROM as above Supine serratus punch 2# 10x  Supine arm at 90 degrees 1#: ABCs, circles, 12:00/6:00 and 9:00/3:00 Sidelying arm at 90 degrees: no weight circles, 12:00/6:00 and 9:00/3:00 Prone rows 2#  2 x 10 Rt  Prone shoulder extension 2# 2 x 10 Rt  Prone horizontal abduction approx 30 degrees 10x (small ROM) Prone scaption 20 degrees 10x (very small ROM) UE Ranger on wall mid level: flexion 10x, scaption 10x Yellow band internal and external rotation mid ROM 10x each Yellow band isometric holds with side stepping (3 steps) 5x each way   08/28/24  Red stability ball roll ups at wall x 10  Push up plus at wall x 10 Plank at wall + shoulder tap (increased soreness with Rt arm reaching across) Yellow band isometric walkouts: yellow x 10 ea dir at 90 deg elbow flex Supine serratus punch 2# 2 x 10 Rt  Supine holds at 90 degrees: 2# small circles each way x 10 each direction Supine shoulder flexion x 10 Rt  Prone rows 2#  2 x 10 Rt  Prone shoulder extension 2# 2 x 10 Rt  Side lying ER 2 x 10 Rt (uncomfortable) attempted weight but too difficult Seated biceps curls 2x10 hammer curl 2# 2x10 with supination  Wall ladder into ABD x 2 (good going up, painful coming down) - tried various AA ABD - door, stairs, standing dowel, SL and supine. Doorway slide was the most comfortable.  Discussed resting her shoulder to help with pain in addition to ice and isometrics    08/23/24   Red stability ball roll ups at wall x 10  Supine serratus punch 2 x 10 Rt  Supine holds at 90 degrees: small circles each way x 10 each direction Supine shoulder  flexion x 10 Rt  Prone rows  2 x 10 Rt  Prone shoulder extension 2 x 10 Rt  Side lying ER 2 x 10 Rt (uncomfortable)  Seated shoulder scaption along plinth 2 x 10 Rt  Seated biceps curls 2x10 hammer curl 2x10 with supination  Seated shoulder pulleys 2' flexion and scaption each  Seated isometrics 50% effort: flexion,abduction, external rotation, internal rotation  5 sec holds 5x each Push up plus at wall x 10 Plank at wall + shoulder tap (increased soreness with Rt arm reaching across) Manual: STM to right biceps and delotoid for decreased muscle spasms and pain.   08/21/24 Quick DASH see above Red stability ball roll ups at wall x 10  Supine serratus punch 2 x 8 Rt  Supine holds at 90 degrees: small circles each way x 10 each direction Supine shoulder flexion x 10 Rt  Prone rows  2 x 10 Rt  Prone shoulder extension 2 x 10 Rt  Side lying ER 2 x 10 Rt (uncomfortable)  Seated shoulder scaption along plinth 2 x 10 Rt  Seated biceps curls x10 hammer curl x 10 with supination  Seated shoulder pulleys 2' flexion and scaption Seated isometrics 50% effort: flexion,abduction, external rotation, internal rotation  5 sec holds 5x each Push up plus at wall 2 x 10 Plank at wall + shoulder tap (increased soreness) Manual: STM to right biceps and delotoid for decreased muscle spasms and pain.    PATIENT EDUCATION: Education details: BERDINE, Educated patient on anatomy and physiology of current symptoms, prognosis, plan of care as well as initial self care  strategies to promote recovery  Person educated: Patient Education method: Explanation Education comprehension: verbalized understanding  HOME EXERCISE PROGRAM: Access Code: [email or web address] URL: [email or web address] Date: 09/04/2024 Prepared by: Mliss  Exercises - Supine Cervical Retraction with Towel  - 2 x daily - 7 x weekly - 1 sets - 10 reps - 10 hold - Supine Cervical Rotation AROM on Pillow  - 2 x daily - 7 x weekly - 1  sets - 5 reps - 10 hold - Seated Scapular Retraction  - 2 x daily - 7 x weekly - 1 sets - 10 reps - Seated Cervical Rotation AROM  - 1 x daily - 7 x weekly - 2 sets - 10 reps - Seated Cervical Sidebending AROM  - 1 x daily - 7 x weekly - 2 sets - 10 reps - Seated Cervical Retraction  - 1 x daily - 7 x weekly - 2 sets - 10 reps - Seated Gripping Towel  - 1 x daily - 7 x weekly - 2 sets - 10 reps - Wrist Flexion Extension AROM - Palms Down  - 1 x daily - 7 x weekly - 2 sets - 10 reps - Closing and Opening Hand With Shoulder Sling  - 1 x daily - 7 x weekly - 2 sets - 10 reps - Seated Shoulder Pendulum Exercise  - 1 x daily - 7 x weekly - 2 sets - 10 reps - Supine Shoulder Flexion AAROM  - 2 x daily - 7 x weekly - 2 sets - 10 reps - Prone Shoulder Row  - 2 x daily - 7 x weekly - 2 sets - 10 reps - Standing Isometric Shoulder External Rotation with Doorway  - 1 x daily - 7 x weekly - 1 sets - 5 reps - 5 hold - Standing Isometric Shoulder Abduction with Doorway - Arm Bent  - 1 x daily - 7 x weekly - 1 sets - 5 reps - 5 hold - Standing Isometric Shoulder Flexion with Doorway - Arm Bent  - 1 x daily - 7 x weekly - 1 sets - 5 reps - 5 hold - Standing Isometric Shoulder Extension with Doorway - Arm Bent  - 1 x daily - 7 x weekly - 1 sets - 5 reps - 5 hold - Shoulder Flexion Wall Slide with Towel  - 2-3 x daily - 7 x weekly - 2 sets - 10 reps - Seated Shoulder Flexion AAROM with Pulley Behind  - 1 x daily - 7 x weekly - 1 sets - 60-120 reps - Seated Shoulder Scaption AAROM with Pulley at Side  - 1 x daily - 7 x weekly - 1 sets - 60-120 reps - Shoulder External Rotation Reactive Isometrics (Mirrored)  - 1 x daily - 3 x weekly - 1-3 sets - 10 reps - Shoulder Internal Rotation Reactive Isometrics  - 1 x daily - 3 x weekly - 1-3 sets - 10 reps - Standing Shoulder Internal Rotation with Anchored Resistance (Mirrored)  - 1 x daily - 3 x weekly - 1-3 sets - 10 reps - Shoulder External Rotation with Anchored  Resistance (Mirrored)  - 1 x daily - 3 x weekly - 1-3 sets - 10 reps - Prone Shoulder Row  - 1 x daily - 3 x weekly - 1-3 sets - 10 reps - Prone Shoulder Extension - Single Arm with Dumbbell  - 1 x daily - 3-4 x weekly - 1-3 sets - 10 reps ASSESSMENT:  CLINICAL IMPRESSION:  Patient continues to report ongoing pain but fairly well controlled with use of a TENS unit during exercise.  Much improved shoulder ROM (a significant change in shoulder abduction in particular.)  Therapist providing verbal cues for exercise technique provided and modifications for pain as needed.     Eval: Patient is a 48 y.o. female who was seen today for physical therapy evaluation and treatment 1 week s/p right shoulder scope with rotator cuff repair, biceps tenodesis and subacromial decompression.  The patient would benefit from skilled PT to address shoulder range of motion limitations while following surgical precautions. When appropriate will progress from passive ROM to active assisted to active ROM and progressive strengthening.  Her current impairments and pain affect ability to perform basic ADLs including personal hygiene, dressing and sleeping and she is unable to work at this time.   OBJECTIVE IMPAIRMENTS: decreased activity tolerance, decreased ROM, decreased strength, increased edema, impaired perceived functional ability, impaired UE functional use, and pain.   ACTIVITY LIMITATIONS: carrying, lifting, sleeping, bed mobility, bathing, toileting, dressing, reach over head, and hygiene/grooming  PARTICIPATION LIMITATIONS: meal prep, cleaning, laundry, driving, shopping, community activity, and occupation  PERSONAL FACTORS: Time since onset of injury/illness/exacerbation are also affecting patient's functional outcome.   REHAB POTENTIAL: Good  CLINICAL DECISION MAKING: Stable/uncomplicated  EVALUATION COMPLEXITY: Low   GOALS: Goals reviewed with patient? Yes  SHORT TERM GOALS: Target date: 08/04/2024      The patient will demonstrate knowledge of basic self care strategies and exercises to promote healing  Baseline: Goal status: met 11/17  2.  The patient will report a 40% improvement in pain levels with functional activities which are currently difficult including sleeping, dressing, bathing Baseline:  Goal status: ONGOING, SLEEPING IN RECLINER 11/17  3.  Passive elevation to 150 degrees needed for overhead reaching Baseline:  Goal status: met 11/17  4.  Quick DASH functional outcome score improved to 70% indicating improved function with less pain Baseline:  Goal status: MET (38.6% ) 08/21/2024      LONG TERM GOALS: Target date: 09/15/2024    The patient will be independent in a safe self progression of a home exercise program to promote further recovery of function  Baseline:  Goal status: INITIAL  2.  The patient will report a 75% improvement in pain levels with functional activities which are currently difficult including sleeping, grooming/dressing, return to work Baseline:  Goal status: INITIAL  3.  The patient will have improved active shoulder elevation ROM to at least 130 degrees needed for grooming/dressing purposes as well as reaching high shelves  Baseline:  Goal status: met 12/24  4.  The patient will be have shoulder external and internal rotation to 40 degrees for dressing Baseline:  Goal status: INITIAL  5.  The patient will have grossly 4/5 strength needed to lift and lower a 2# object from a high shelf  Baseline:  Goal status: INITIAL  6.  Quick DASH functional outcome score improved to 60% indicating improved function with less pain Baseline:  Goal status: MET (38.6% ) 08/21/2024   PLAN:  PT FREQUENCY: 2x/week  PT DURATION: 12 weeks  PLANNED INTERVENTIONS: 97164- PT Re-evaluation, 97750- Physical Performance Testing, 97110-Therapeutic exercises, 97530- Therapeutic activity, [id number]- Neuromuscular re-education, 97535- Self Care, [id number]- Manual  therapy, [id number]- Aquatic Therapy, [id number]- Electrical stimulation (unattended), [id number]- Electrical stimulation (manual), 97016- Vasopneumatic device, Patient/Family education, Taping, Joint mobilization, Cryotherapy, and Moist heat  PLAN FOR NEXT SESSION: assess response to treatment session; progress as tolerated; may need a  slower progression of active assisted to active ROM secondary to pain; scapular stabilization glenohumeral and scapular mobilization; TENS during ex  Glade Pesa, PT 09/06/2024 8:46 AM Phone: [phone] Fax: [phone]   [facility] [address], [address] [place], [place] [address] Phone # [phone] Fax [phone]  "

## 2024-09-11 ENCOUNTER — Encounter

## 2024-09-12 DIAGNOSIS — F4310 Post-traumatic stress disorder, unspecified: Secondary | ICD-10-CM | POA: Diagnosis not present

## 2024-09-13 ENCOUNTER — Encounter: Payer: Self-pay | Admitting: Physical Therapy

## 2024-09-13 ENCOUNTER — Ambulatory Visit: Admitting: Physical Therapy

## 2024-09-13 DIAGNOSIS — M25611 Stiffness of right shoulder, not elsewhere classified: Secondary | ICD-10-CM

## 2024-09-13 DIAGNOSIS — M25511 Pain in right shoulder: Secondary | ICD-10-CM | POA: Diagnosis not present

## 2024-09-13 DIAGNOSIS — M6281 Muscle weakness (generalized): Secondary | ICD-10-CM

## 2024-09-13 NOTE — Therapy (Signed)
 " OUTPATIENT PHYSICAL THERAPY SHOULDER TREATMENT/RECERTIFICATION   Patient Name: Caroline Gonzales MRN: [id number] DOB:03-Aug-1976, 48 y.o., female Today's Date: 09/13/2024  END OF SESSION:  PT End of Session - 09/13/24 1525     Visit Number 21    Date for Recertification  12/06/24    Authorization Type [employer]    PT Start Time 1526    PT Stop Time 1610    PT Time Calculation (min) 44 min    Activity Tolerance Patient tolerated treatment well                   Past Medical History:  Diagnosis Date   Acute costochondritis 11/14/2021   Allergy    Zyrtec .   Anxiety    followed by Dr. Melba   Back pain    Complication of anesthesia    PONV   Depression    Elevated blood pressure reading without diagnosis of hypertension 12/29/2021   Fibroid    Gastroparesis 09/14/2012   gastric emptying study in 2014   GERD (gastroesophageal reflux disease)    Headache    Hypertriglyceridemia 10/07/2017   Nightmares 07/21/2018   Pneumonia    2013ish   PONV (postoperative nausea and vomiting)     likes scopolamine  patch and zofran  /phenergan  helps   Pre-diabetes    PTSD (post-traumatic stress disorder)    Residual foreign body in soft tissue 11/12/2016   Screening for colon cancer 07/02/2022   Sleep apnea 07/01/2018   cpap optional, pt close to not use cpap   Vitamin D  deficiency    Wears glasses    Past Surgical History:  Procedure Laterality Date   ABDOMINAL HYSTERECTOMY  01/2021   ANKLE ARTHROSCOPY Left 2011   BICEPT TENODESIS Right 06/16/2024   Procedure: TENODESIS, BICEPS;  Surgeon: Sharl Selinda Dover, MD;  Location: [facility];  Service: Orthopedics;  Laterality: Right;   CESAREAN SECTION  06/2006   x 1   CHOLECYSTECTOMY  07/2006   laparoscopic   CYSTOSCOPY N/A 02/04/2021   Procedure: CYSTOSCOPY;  Surgeon: Jannis Kate Norris, MD;  Location: [facility] OR;  Service: Gynecology;  Laterality: N/A;   DILATION AND CURETTAGE OF UTERUS  1997   x 2    FOREIGN BODY REMOVAL Left 11/18/2016   Procedure: REMOVAL FOREIGN BODY EXTREMITY LEFT FOOT;  Surgeon: Donnice JONELLE Fees, DPM;  Location: [facility] OR;  Service: Podiatry;  Laterality: Left;   GASTRIC ROUX-EN-Y N/A 04/13/2023   Procedure: LAPAROSCOPIC ROUX-EN-Y GASTRIC BYPASS WITH UPPER ENDOSCOPY;  Surgeon: Signe Mitzie LABOR, MD;  Location: [facility];  Service: General;  Laterality: N/A;   KNEE ARTHROSCOPY WITH MEDIAL MENISECTOMY Right 09/10/2021   Procedure: KNEE ARTHROSCOPY WITH MEDIAL MENISCAL ROOT REPAIR;  Surgeon: Cristy Bonner DASEN, MD;  Location: [facility];  Service: Orthopedics;  Laterality: Right;   PILONIDAL CYST EXCISION  1990's   RADIOLOGY WITH ANESTHESIA N/A 11/10/2018   Procedure: MRI WITH ANESTHESIA OF BRAIN AND ORBITS WITH AND WITHOUT CONTRAST;  Surgeon: Radiologist, Medication, MD;  Location: [facility] OR;  Service: Radiology;  Laterality: N/A;   SHOULDER ARTHROSCOPY WITH ROTATOR CUFF REPAIR Right 06/16/2024   Procedure: ARTHROSCOPY, SHOULDER, WITH ROTATOR CUFF REPAIR;  Surgeon: Sharl Selinda Dover, MD;  Location: [facility];  Service: Orthopedics;  Laterality: Right;   SUBACROMIAL DECOMPRESSION Right 06/16/2024   Procedure: DECOMPRESSION, SUBACROMIAL SPACE;  Surgeon: Sharl Selinda Dover, MD;  Location:  SURGERY CENTER;  Service: Orthopedics;  Laterality: Right;  Right shoulder arthroscopy with rotator cuff repair,  biceps tenodesis, subacromial decompression, distal clavicle excision   TENDON REPAIR Left 2011   Left Ankle   TOTAL LAPAROSCOPIC HYSTERECTOMY WITH SALPINGECTOMY Bilateral 02/04/2021   Procedure: TOTAL LAPAROSCOPIC HYSTERECTOMY WITH SALPINGECTOMY;  Surgeon: Jannis Kate Norris, MD;  Location: [facility] OR;  Service: Gynecology;  Laterality: Bilateral;   UPPER GASTROINTESTINAL ENDOSCOPY     WISDOM TOOTH EXTRACTION  1990's   Patient Active Problem List   Diagnosis Date Noted   History of nocturnal hypoglycemia 03/02/2024   Encounter for annual physical  exam 03/02/2024   Family history of elevated lipoprotein (a) 04/26/2023   PVC's (premature ventricular contractions) 04/26/2023   Morbid obesity (HCC) 04/13/2023   Gastroesophageal reflux disease 12/24/2022   Abnormal laboratory test 12/24/2022   Chest wall pain 12/24/2022   Change in facial mole 12/29/2021   S/P laparoscopic hysterectomy 02/04/2021   PTSD (post-traumatic stress disorder) 07/21/2018   GAD (generalized anxiety disorder) 07/21/2018   Insomnia 07/21/2018   Recurrent major depressive disorder, in partial remission 07/21/2018   Insulin  resistance 10/21/2017   Hypertriglyceridemia 10/07/2017   Vitamin D  deficiency 10/07/2017   Pre-diabetes 11/02/2016   Morbid obesity with body mass index (BMI) of 50.0 to 59.9 in adult (HCC) 10/15/2016    PCP: Oris Credit NP  REFERRING PROVIDER: Sharl Mayo MD  REFERRING DIAG: s/p right shoulder scope with rotator cuff repair, biceps tenodesis and subacromial decompression  THERAPY DIAG: right shoulder pain; right shoulder stiffness; weakness   Rationale for Evaluation and Treatment: Rehabilitation  ONSET DATE: Spring 2025  SUBJECTIVE:                                                                                                                                                                                      SUBJECTIVE STATEMENT: Painful without the TENS on.  Going back to work Monday, light lifting restriction.     Hand dominance: Right  Date of surgery 06/16/2024 s/p right shoulder scope with rotator cuff repair, biceps tenodesis and subacromial decompression Pt reports that MD advised her that she could sleep with sling off with pillow support and that she may have her arm straight with support A/ROM to begin at 6 weeks  PERTINENT HISTORY: Right shoulder scope with rotator cuff repair, biceps tenodesis, and subacromial decompression on 06/16/24 Knee scope 12/29/23 Can't take NSAIDS b/c of gastric bypass  PAIN:   09/13/2024  Are you having pain? Yes NPRS scale: 3/10  Pain location: right elbow - achy comes and goes - located at lateral epicondyle, right shoulder achy Pain orientation: Right  PAIN TYPE: aching Pain description: constant  Aggravating factors: repetition,  Relieving factors: medication   PRECAUTIONS: Shoulder surgical  WEIGHT BEARING RESTRICTIONS: No  FALLS:  Has patient fallen in last 6 months? 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  PLOF: Independent  PATIENT GOALS:baseline be 75% ; shopping, goes to [employer] stretching class in the [place]   NEXT MD VISIT:   OBJECTIVE:  Note: Objective measures were completed at Evaluation unless otherwise noted.  DIAGNOSTIC FINDINGS:  Full thickness tear of subscapularis and supraspinatus and biceps tear  PATIENT SURVEYS:  Quick Dash:  QUICK DASH  Please rate your ability do the following activities in the last week by selecting the number below the appropriate response.   Activities Rating  Open a tight or new jar.  5 = Unable  Do heavy household chores (e.g., wash walls, floors). 5 = Unable  Carry a shopping bag or briefcase 5 = Unable  Wash your back. 5 = Unable  Use a knife to cut food. 5 = Unable  Recreational activities in which you take some force or impact through your arm, shoulder or hand (e.g., golf, hammering, tennis, etc.). 5 = Unable  During the past week, to what extent has your arm, shoulder or hand problem interfered with your normal social activities with family, friends, neighbors or groups?  4 = Quite a bit  During the past week, were you limited in your work or other regular daily activities as a result of your arm, shoulder or hand problem? 5 = Unable  Rate the severity of the following symptoms in the last week: Arm, Shoulder, or hand pain. 4 = Severe   Rate the severity of the following symptoms in the last week: Tingling (pins and needles) in your arm, shoulder or hand. 1 = none  During the past week, how much difficulty have you had sleeping because of the pain in your arm, shoulder or hand?  3 = Moderate difficulty   (A QuickDASH score may not be calculated if there is greater than 1 missing item.)  Quick Dash Disability/Symptom Score: 81.8%   Minimally Clinically Important Difference (MCID): 15-20 points    08/21/2024 Quick DASH : 38.6/100 38.6%   12/31: 29.5%  COGNITION: Overall cognitive status: Within functional limits for tasks assessed     SENSATION: Denies numbness/tingling  APPEARANCE:  3 portal incisions with steri strips; no redness; no drainage; amount of edema consistent with 1 week post op    UPPER EXTREMITY ROM:  Passive ROM Right eval   Shoulder flexion 15   Shoulder extension    Shoulder abduction 10   Shoulder adduction    Shoulder extension    Shoulder internal rotation 45   Shoulder external rotation 0   Elbow flexion    Elbow extension    Wrist flexion    Wrist extension    Wrist ulnar deviation    Wrist radial deviation    Wrist pronation    Wrist supination     (Blank rows = not tested)   Active ROM Right eval Left eval 12/18 12/24 12/31  Right  Shoulder flexion Not tested secondary to surgical precautions WFLS 153 161 167  Shoulder extension       Shoulder abduction  WFLS 135 painful  150 161  Shoulder adduction       Shoulder internal rotation  WFLs Thumb to Coccyx   Thumb to L5  Shoulder external rotation  WFLs 78  86  Elbow flexion  Elbow extension       Wrist flexion       Wrist extension       Wrist ulnar deviation       Wrist radial deviation       Wrist pronation       Wrist supination       (Blank rows = not tested)  UPPER EXTREMITY MMT:  MMT Right eval Left eval 12/18 R 12/31  Shoulder flexion Not tested secondary to surgical precautions 5 3+ 3+   Shoulder extension  5    Shoulder abduction  5 3- 3-  Shoulder adduction      Shoulder internal rotation  5 4 4   Shoulder external rotation  5 4 4   Middle trapezius      Lower trapezius      Elbow flexion  5    Elbow extension  5    Wrist flexion  5    Wrist extension  5    Wrist ulnar deviation      Wrist radial deviation      Wrist pronation      Wrist supination      Grip strength (lbs)      (Blank rows = not tested)                                                                                                                              TREATMENT DATE:  09/13/24 With TENS on concurrently with ex (pt's own unit) Landmine press red power cord seated 15x Supine flat: Shoulder flexion 1# 10x (bent elbow to lower for comfort) Incline 30 degrees table: Supine serratus punch 2# 20x (increase to 3# next time) Supine arm at 90 degrees 2#: ABCs, circles, 12:00/6:00 and 9:00/3:00 Sidelying: Sidelying arm at 90 degrees: 1# circles, 12:00/6:00 and 9:00/3:00 Sidelying external rotation 10x 1#  Prone: Shoulder horizontal abduction small range 1# 10x; Shoulder scaption small range 1# 10x Shoulder ROM Standing: Wall push ups 10x Purple ball on wall ABCs (able to make it to A- Z) External rotation (elbow at side) 10x red band Internal rotation elbow at side 10x red band Red physioball wall roll ups with 10 sec holds at the top 10x Cone placement onto 2nd and 3rd shelves 2 minutes  09/06/24 ES IFC 11.0 ma  to right shoulder for duration of session for pain control Prone rows 2#  2 x 10 Rt  Prone shoulder extension 2# 2 x 10 Rt  Prone horizontal abduction approx 30 degrees 10x (small ROM) Incline 30 degrees table: Supine serratus punch 2# 10x  Supine shoulder flexion 10x; added 1# 5x (challenging and painful, modified with bent elbow and fewer reps)  Supine arm at 90 degrees 1#: ABCs, circles, 12:00/6:00 and 9:00/3:00 Sidelying: Shoulder abduction 5x (painful) Sidelying arm  at 90 degrees: no weight circles, 12:00/6:00 and 9:00/3:00 Sidelying external rotation 10x no weight Standing: Wall push ups 10x Purple ball on wall circles 10x each  way Attempted ball on wall and turn body but too painful Ball on wall shoulder abduction circles 5x (painful) External rotation (elbow at side) 10x yellow band Shoulder extension red band 10x 2 (anchored over the top of the door) Shoulder internal rotation 10x Pain 4-5/10 with TENS on In the doorway shoulder abduction up the door 10x (most comfortable position for abduction)  09/04/24 Pt wearing TENS for duration of session UE Ranger on wall mid level: flexion 10x, scaption 10x Red stability ball roll ups at wall x 10  IR with strap 5 reps max hold Doorway slide for ABD x 10 Supine serratus punch 2# 10x  Supine arm at 90 degrees 1#: ABCs, circles, 12:00/6:00 and 9:00/3:00 Sidelying arm at 90 degrees: no weight circles, 12:00/6:00 and 9:00/3:00 SL ABD x 10 SL horizontal ADD/ABD x 10 Prone rows 2#  2 x 10 Rt  Prone shoulder extension 2# 2 x 10 Rt  Prone horizontal abduction approx 30 degrees 10x (small ROM) Yellow band internal and external rotation  10x each Yellow band isometric holds with side stepping (3 steps) 10x each way  08/31/24 Pt wearing TENS for duration of session Discussion on techniques for internal rotation counter top assist Shoulder ROM as above Supine serratus punch 2# 10x  Supine arm at 90 degrees 1#: ABCs, circles, 12:00/6:00 and 9:00/3:00 Sidelying arm at 90 degrees: no weight circles, 12:00/6:00 and 9:00/3:00 Prone rows 2#  2 x 10 Rt  Prone shoulder extension 2# 2 x 10 Rt  Prone horizontal abduction approx 30 degrees 10x (small ROM) Prone scaption 20 degrees 10x (very small ROM) UE Ranger on wall mid level: flexion 10x, scaption 10x Yellow band internal and external rotation mid ROM 10x each Yellow band isometric holds with side stepping (3 steps) 5x each way   08/28/24  Red stability  ball roll ups at wall x 10  Push up plus at wall x 10 Plank at wall + shoulder tap (increased soreness with Rt arm reaching across) Yellow band isometric walkouts: yellow x 10 ea dir at 90 deg elbow flex Supine serratus punch 2# 2 x 10 Rt  Supine holds at 90 degrees: 2# small circles each way x 10 each direction Supine shoulder flexion x 10 Rt  Prone rows 2#  2 x 10 Rt  Prone shoulder extension 2# 2 x 10 Rt  Side lying ER 2 x 10 Rt (uncomfortable) attempted weight but too difficult Seated biceps curls 2x10 hammer curl 2# 2x10 with supination  Wall ladder into ABD x 2 (good going up, painful coming down) - tried various AA ABD - door, stairs, standing dowel, SL and supine. Doorway slide was the most comfortable.  Discussed resting her shoulder to help with pain in addition to ice and isometrics    PATIENT EDUCATION: Education details: BERDINE, Educated patient on anatomy and physiology of current symptoms, prognosis, plan of care as well as initial self care strategies to promote recovery  Person educated: Patient Education method: Explanation Education comprehension: verbalized understanding  HOME EXERCISE PROGRAM: Access Code: [email or web address] URL: [email or web address] Date: 09/04/2024 Prepared by: Mliss  Exercises - Supine Cervical Retraction with Towel  - 2 x daily - 7 x weekly - 1 sets - 10 reps - 10 hold - Supine Cervical Rotation AROM on Pillow  - 2 x daily - 7 x weekly - 1 sets - 5 reps - 10 hold - Seated Scapular Retraction  - 2 x daily - 7 x weekly - 1 sets - 10 reps -  Seated Cervical Rotation AROM  - 1 x daily - 7 x weekly - 2 sets - 10 reps - Seated Cervical Sidebending AROM  - 1 x daily - 7 x weekly - 2 sets - 10 reps - Seated Cervical Retraction  - 1 x daily - 7 x weekly - 2 sets - 10 reps - Seated Gripping Towel  - 1 x daily - 7 x weekly - 2 sets - 10 reps - Wrist Flexion Extension AROM - Palms Down  - 1 x daily - 7 x weekly - 2 sets - 10 reps - Closing and  Opening Hand With Shoulder Sling  - 1 x daily - 7 x weekly - 2 sets - 10 reps - Seated Shoulder Pendulum Exercise  - 1 x daily - 7 x weekly - 2 sets - 10 reps - Supine Shoulder Flexion AAROM  - 2 x daily - 7 x weekly - 2 sets - 10 reps - Prone Shoulder Row  - 2 x daily - 7 x weekly - 2 sets - 10 reps - Standing Isometric Shoulder External Rotation with Doorway  - 1 x daily - 7 x weekly - 1 sets - 5 reps - 5 hold - Standing Isometric Shoulder Abduction with Doorway - Arm Bent  - 1 x daily - 7 x weekly - 1 sets - 5 reps - 5 hold - Standing Isometric Shoulder Flexion with Doorway - Arm Bent  - 1 x daily - 7 x weekly - 1 sets - 5 reps - 5 hold - Standing Isometric Shoulder Extension with Doorway - Arm Bent  - 1 x daily - 7 x weekly - 1 sets - 5 reps - 5 hold - Shoulder Flexion Wall Slide with Towel  - 2-3 x daily - 7 x weekly - 2 sets - 10 reps - Seated Shoulder Flexion AAROM with Pulley Behind  - 1 x daily - 7 x weekly - 1 sets - 60-120 reps - Seated Shoulder Scaption AAROM with Pulley at Side  - 1 x daily - 7 x weekly - 1 sets - 60-120 reps - Shoulder External Rotation Reactive Isometrics (Mirrored)  - 1 x daily - 3 x weekly - 1-3 sets - 10 reps - Shoulder Internal Rotation Reactive Isometrics  - 1 x daily - 3 x weekly - 1-3 sets - 10 reps - Standing Shoulder Internal Rotation with Anchored Resistance (Mirrored)  - 1 x daily - 3 x weekly - 1-3 sets - 10 reps - Shoulder External Rotation with Anchored Resistance (Mirrored)  - 1 x daily - 3 x weekly - 1-3 sets - 10 reps - Prone Shoulder Row  - 1 x daily - 3 x weekly - 1-3 sets - 10 reps - Prone Shoulder Extension - Single Arm with Dumbbell  - 1 x daily - 3-4 x weekly - 1-3 sets - 10 reps ASSESSMENT:  CLINICAL IMPRESSION: Kitt is progressing steadily with shoulder ROM in all planes with significant improvements even from 1 week ago.  We were able to progress the resistance amount with several ex's today with pain level well controlled with use of  the TENS.  She rates her pain at 1/10 at the end of session. Quick DASH functional outcome score continues to improve as well. The patient would benefit from a continuation of skilled PT for a further progression of strengthening and functional mobility.  Will continue to update and promote independence in a HEP needed for a return to the highest  functional level possible with ADLs.       OBJECTIVE IMPAIRMENTS: decreased activity tolerance, decreased ROM, decreased strength, increased edema, impaired perceived functional ability, impaired UE functional use, and pain.   ACTIVITY LIMITATIONS: carrying, lifting, sleeping, bed mobility, bathing, toileting, dressing, reach over head, and hygiene/grooming  PARTICIPATION LIMITATIONS: meal prep, cleaning, laundry, driving, shopping, community activity, and occupation  PERSONAL FACTORS: Time since onset of injury/illness/exacerbation are also affecting patient's functional outcome.   REHAB POTENTIAL: Good  CLINICAL DECISION MAKING: Stable/uncomplicated  EVALUATION COMPLEXITY: Low   GOALS: Goals reviewed with patient? Yes  SHORT TERM GOALS: Target date: 08/04/2024     The patient will demonstrate knowledge of basic self care strategies and exercises to promote healing  Baseline: Goal status: met 11/17  2.  The patient will report a 40% improvement in pain levels with functional activities which are currently difficult including sleeping, dressing, bathing Baseline:  Goal status: ONGOING, SLEEPING IN RECLINER 11/17  3.  Passive elevation to 150 degrees needed for overhead reaching Baseline:  Goal status: met 11/17  4.  Quick DASH functional outcome score improved to 70% indicating improved function with less pain Baseline:  Goal status: MET (38.6% ) 08/21/2024      LONG TERM GOALS: Target date:12/06/2024    The patient will be independent in a safe self progression of a home exercise program to promote further recovery of function   Baseline:  Goal status: ongoing  2.  The patient will report a 75% improvement in pain levels with functional activities which are currently difficult including sleeping, grooming/dressing, return to work Baseline:  Goal status: ongoing 3.  The patient will have improved active shoulder elevation ROM to at least 130 degrees needed for grooming/dressing purposes as well as reaching high shelves  Baseline:  Goal status: met 12/24  4.  The patient will be have shoulder external and internal rotation to 40 degrees for dressing Baseline:  Goal status: met 12/31 5.  The patient will have grossly 4/5 strength needed to lift and lower a 2# object from a high shelf  Baseline:  Goal status: ongoing  6.  Quick DASH functional outcome score improved to 60% indicating improved function with less pain Baseline:  Goal status: MET (38.6% ) 08/21/2024   7. Patient will shoulder muscular ROM and endurance to perform overhead activity for 1 minute NEW  8.  The patient will have UE strength to carry a 7# object (farmer's hold) for grocery shopping NEW  9. Quick DASH score improved to 20% indicating improved function with less pain NEW  PLAN:  PT FREQUENCY: 2x/week with tapering of visits  PT DURATION: 12 weeks  PLANNED INTERVENTIONS: 97164- PT Re-evaluation, 97750- Physical Performance Testing, 97110-Therapeutic exercises, 97530- Therapeutic activity, 97112- Neuromuscular re-education, 97535- Self Care, [id number]- Manual therapy, [phone]- Aquatic Therapy, [id number]- Electrical stimulation (unattended), [phone]- Electrical stimulation (manual), 97016- Vasopneumatic device, Patient/Family education, Taping, Joint mobilization, Cryotherapy, and Moist heat  PLAN FOR NEXT SESSION: see how return to work goes; progressive strengthening and shoulder ROM;  TENS during ex   Glade Pesa, PT 09/13/2024 4:26 PM Phone: [phone] Fax: [phone]  [facility] [address]  [address] [place], [place] [address] Phone # [phone] Fax [phone]  "

## 2024-09-17 ENCOUNTER — Other Ambulatory Visit (HOSPITAL_COMMUNITY): Payer: Self-pay

## 2024-09-17 ENCOUNTER — Other Ambulatory Visit: Payer: Self-pay | Admitting: Physician Assistant

## 2024-09-18 ENCOUNTER — Other Ambulatory Visit (HOSPITAL_COMMUNITY): Payer: Self-pay

## 2024-09-18 ENCOUNTER — Encounter: Payer: Self-pay | Admitting: Pharmacist

## 2024-09-18 ENCOUNTER — Other Ambulatory Visit: Payer: Self-pay

## 2024-09-18 MED ORDER — PANTOPRAZOLE SODIUM 40 MG PO TBEC
40.0000 mg | DELAYED_RELEASE_TABLET | Freq: Every day | ORAL | 0 refills | Status: AC
  Filled 2024-09-18: qty 90, 90d supply, fill #0

## 2024-09-18 MED ORDER — CYCLOBENZAPRINE HCL 5 MG PO TABS
5.0000 mg | ORAL_TABLET | Freq: Three times a day (TID) | ORAL | 1 refills | Status: AC | PRN
  Filled 2024-09-18: qty 40, 14d supply, fill #0

## 2024-09-19 ENCOUNTER — Other Ambulatory Visit: Payer: Self-pay

## 2024-09-19 ENCOUNTER — Other Ambulatory Visit (HOSPITAL_COMMUNITY): Payer: Self-pay

## 2024-09-19 ENCOUNTER — Other Ambulatory Visit: Payer: Self-pay | Admitting: Physician Assistant

## 2024-09-19 MED ORDER — QUETIAPINE FUMARATE 200 MG PO TABS
400.0000 mg | ORAL_TABLET | Freq: Every day | ORAL | 3 refills | Status: AC
  Filled 2024-09-19: qty 180, 90d supply, fill #0

## 2024-09-19 MED ORDER — FLUVOXAMINE MALEATE 100 MG PO TABS
100.0000 mg | ORAL_TABLET | Freq: Every day | ORAL | 3 refills | Status: AC
  Filled 2024-09-19: qty 90, 90d supply, fill #0

## 2024-09-19 MED ORDER — PRAZOSIN HCL 5 MG PO CAPS
10.0000 mg | ORAL_CAPSULE | Freq: Every day | ORAL | 0 refills | Status: AC
  Filled 2024-09-19: qty 180, 90d supply, fill #0

## 2024-09-19 MED ORDER — FLUVOXAMINE MALEATE 50 MG PO TABS
50.0000 mg | ORAL_TABLET | Freq: Every day | ORAL | 3 refills | Status: AC
  Filled 2024-09-19: qty 90, 90d supply, fill #0

## 2024-09-20 ENCOUNTER — Other Ambulatory Visit (HOSPITAL_COMMUNITY): Payer: Self-pay

## 2024-09-20 ENCOUNTER — Other Ambulatory Visit: Payer: Self-pay

## 2024-09-20 ENCOUNTER — Ambulatory Visit: Admitting: Physical Therapy

## 2024-09-21 ENCOUNTER — Other Ambulatory Visit: Payer: Self-pay

## 2024-09-21 ENCOUNTER — Other Ambulatory Visit (HOSPITAL_COMMUNITY): Payer: Self-pay

## 2024-09-22 ENCOUNTER — Other Ambulatory Visit (HOSPITAL_COMMUNITY): Payer: Self-pay

## 2024-09-22 ENCOUNTER — Telehealth (HOSPITAL_COMMUNITY): Payer: Self-pay

## 2024-09-25 ENCOUNTER — Ambulatory Visit: Attending: Orthopedic Surgery

## 2024-09-25 DIAGNOSIS — M6281 Muscle weakness (generalized): Secondary | ICD-10-CM | POA: Diagnosis present

## 2024-09-25 DIAGNOSIS — R293 Abnormal posture: Secondary | ICD-10-CM | POA: Diagnosis present

## 2024-09-25 DIAGNOSIS — M25611 Stiffness of right shoulder, not elsewhere classified: Secondary | ICD-10-CM | POA: Insufficient documentation

## 2024-09-25 DIAGNOSIS — R600 Localized edema: Secondary | ICD-10-CM | POA: Diagnosis present

## 2024-09-25 DIAGNOSIS — M25511 Pain in right shoulder: Secondary | ICD-10-CM | POA: Insufficient documentation

## 2024-09-25 NOTE — Therapy (Signed)
 " OUTPATIENT PHYSICAL THERAPY SHOULDER TREATMENT/RECERTIFICATION   Patient Name: Caroline Gonzales MRN: [id number] DOB:06-25-1976, 49 y.o., female Today's Date: 09/25/2024  END OF SESSION:             Past Medical History:  Diagnosis Date   Acute costochondritis 11/14/2021   Allergy    Zyrtec .   Anxiety    followed by Dr. Melba   Back pain    Complication of anesthesia    PONV   Depression    Elevated blood pressure reading without diagnosis of hypertension 12/29/2021   Fibroid    Gastroparesis 09/14/2012   gastric emptying study in 2014   GERD (gastroesophageal reflux disease)    Headache    Hypertriglyceridemia 10/07/2017   Nightmares 07/21/2018   Pneumonia    2013ish   PONV (postoperative nausea and vomiting)     likes scopolamine  patch and zofran  /phenergan  helps   Pre-diabetes    PTSD (post-traumatic stress disorder)    Residual foreign body in soft tissue 11/12/2016   Screening for colon cancer 07/02/2022   Sleep apnea 07/01/2018   cpap optional, pt close to not use cpap   Vitamin D  deficiency    Wears glasses    Past Surgical History:  Procedure Laterality Date   ABDOMINAL HYSTERECTOMY  01/2021   ANKLE ARTHROSCOPY Left 2011   BICEPT TENODESIS Right 06/16/2024   Procedure: TENODESIS, BICEPS;  Surgeon: Sharl Selinda Dover, MD;  Location: [facility];  Service: Orthopedics;  Laterality: Right;   CESAREAN SECTION  06/2006   x 1   CHOLECYSTECTOMY  07/2006   laparoscopic   CYSTOSCOPY N/A 02/04/2021   Procedure: CYSTOSCOPY;  Surgeon: Jannis Kate Norris, MD;  Location: [facility] OR;  Service: Gynecology;  Laterality: N/A;   DILATION AND CURETTAGE OF UTERUS  1997   x 2   FOREIGN BODY REMOVAL Left 11/18/2016   Procedure: REMOVAL FOREIGN BODY EXTREMITY LEFT FOOT;  Surgeon: Donnice JONELLE Fees, DPM;  Location: [facility] OR;  Service: Podiatry;  Laterality: Left;   GASTRIC ROUX-EN-Y N/A 04/13/2023   Procedure: LAPAROSCOPIC ROUX-EN-Y GASTRIC BYPASS WITH  UPPER ENDOSCOPY;  Surgeon: Signe Mitzie LABOR, MD;  Location: [facility];  Service: General;  Laterality: N/A;   KNEE ARTHROSCOPY WITH MEDIAL MENISECTOMY Right 09/10/2021   Procedure: KNEE ARTHROSCOPY WITH MEDIAL MENISCAL ROOT REPAIR;  Surgeon: Cristy Bonner DASEN, MD;  Location: [facility];  Service: Orthopedics;  Laterality: Right;   PILONIDAL CYST EXCISION  1990's   RADIOLOGY WITH ANESTHESIA N/A 11/10/2018   Procedure: MRI WITH ANESTHESIA OF BRAIN AND ORBITS WITH AND WITHOUT CONTRAST;  Surgeon: Radiologist, Medication, MD;  Location: [facility] OR;  Service: Radiology;  Laterality: N/A;   SHOULDER ARTHROSCOPY WITH ROTATOR CUFF REPAIR Right 06/16/2024   Procedure: ARTHROSCOPY, SHOULDER, WITH ROTATOR CUFF REPAIR;  Surgeon: Sharl Selinda Dover, MD;  Location: [facility];  Service: Orthopedics;  Laterality: Right;   SUBACROMIAL DECOMPRESSION Right 06/16/2024   Procedure: DECOMPRESSION, SUBACROMIAL SPACE;  Surgeon: Sharl Selinda Dover, MD;  Location: [facility];  Service: Orthopedics;  Laterality: Right;  Right shoulder arthroscopy with rotator cuff repair, biceps tenodesis, subacromial decompression, distal clavicle excision   TENDON REPAIR Left 2011   Left Ankle   TOTAL LAPAROSCOPIC HYSTERECTOMY WITH SALPINGECTOMY Bilateral 02/04/2021   Procedure: TOTAL LAPAROSCOPIC HYSTERECTOMY WITH SALPINGECTOMY;  Surgeon: Jannis Kate Norris, MD;  Location: [facility] OR;  Service: Gynecology;  Laterality: Bilateral;   UPPER GASTROINTESTINAL ENDOSCOPY     WISDOM TOOTH EXTRACTION  1990's   Patient  Active Problem List   Diagnosis Date Noted   History of nocturnal hypoglycemia 03/02/2024   Encounter for annual physical exam 03/02/2024   Family history of elevated lipoprotein (a) 04/26/2023   PVC's (premature ventricular contractions) 04/26/2023   Morbid obesity (HCC) 04/13/2023   Gastroesophageal reflux disease 12/24/2022   Abnormal laboratory test 12/24/2022   Chest wall pain  12/24/2022   Change in facial mole 12/29/2021   S/P laparoscopic hysterectomy 02/04/2021   PTSD (post-traumatic stress disorder) 07/21/2018   GAD (generalized anxiety disorder) 07/21/2018   Insomnia 07/21/2018   Recurrent major depressive disorder, in partial remission 07/21/2018   Insulin  resistance 10/21/2017   Hypertriglyceridemia 10/07/2017   Vitamin D  deficiency 10/07/2017   Pre-diabetes 11/02/2016   Morbid obesity with body mass index (BMI) of 50.0 to 59.9 in adult [facility]) 10/15/2016    PCP: Oris Credit NP  REFERRING PROVIDER: Sharl Mayo MD  REFERRING DIAG: s/p right shoulder scope with rotator cuff repair, biceps tenodesis and subacromial decompression  THERAPY DIAG: right shoulder pain; right shoulder stiffness; weakness   Rationale for Evaluation and Treatment: Rehabilitation  ONSET DATE: Spring 2025  SUBJECTIVE:                                                                                                                                                                                      SUBJECTIVE STATEMENT: I went back to work and I'm on light duty right.  Wearing TENs unit today.  Using Rt UE 65% for home tasks- limiting heavy lifting  Hand dominance: Right  Date of surgery 06/16/2024 s/p right shoulder scope with rotator cuff repair, biceps tenodesis and subacromial decompression Pt reports that MD advised her that she could sleep with sling off with pillow support and that she may have her arm straight with support A/ROM to begin at 6 weeks  PERTINENT HISTORY: Right shoulder scope with rotator cuff repair, biceps tenodesis, and subacromial decompression on 06/16/24 Knee scope 12/29/23 Can't take NSAIDS b/c of gastric bypass  PAIN:  09/25/2024  Are you having pain? Yes NPRS scale: 0/10-6/10 Pain location: right elbow - achy comes and goes - located at lateral epicondyle, right shoulder achy Pain orientation: Right  PAIN TYPE: aching Pain description:  constant  Aggravating factors: repetition,  Relieving factors: medication   PRECAUTIONS: Shoulder surgical   WEIGHT BEARING RESTRICTIONS: No  FALLS:  Has patient fallen in last 6 months? 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  PLOF: Independent  PATIENT GOALS:baseline be 75% ; shopping, goes to [employer]  stretching class in the [place]   NEXT MD VISIT:   OBJECTIVE:  Note: Objective measures were completed at Evaluation unless otherwise noted.  DIAGNOSTIC FINDINGS:  Full thickness tear of subscapularis and supraspinatus and biceps tear  PATIENT SURVEYS:  Quick Dash:  QUICK DASH  Please rate your ability do the following activities in the last week by selecting the number below the appropriate response.   Activities Rating  Open a tight or new jar.  5 = Unable  Do heavy household chores (e.g., wash walls, floors). 5 = Unable  Carry a shopping bag or briefcase 5 = Unable  Wash your back. 5 = Unable  Use a knife to cut food. 5 = Unable  Recreational activities in which you take some force or impact through your arm, shoulder or hand (e.g., golf, hammering, tennis, etc.). 5 = Unable  During the past week, to what extent has your arm, shoulder or hand problem interfered with your normal social activities with family, friends, neighbors or groups?  4 = Quite a bit  During the past week, were you limited in your work or other regular daily activities as a result of your arm, shoulder or hand problem? 5 = Unable  Rate the severity of the following symptoms in the last week: Arm, Shoulder, or hand pain. 4 = Severe  Rate the severity of the following symptoms in the last week: Tingling (pins and needles) in your arm, shoulder or hand. 1 = none  During the past week, how much difficulty have you had sleeping because of the pain in your arm,  shoulder or hand?  3 = Moderate difficulty   (A QuickDASH score may not be calculated if there is greater than 1 missing item.)  Quick Dash Disability/Symptom Score: 81.8%   Minimally Clinically Important Difference (MCID): 15-20 points    08/21/2024 Quick DASH : 38.6/100 38.6%   12/31: 29.5%  COGNITION: Overall cognitive status: Within functional limits for tasks assessed     SENSATION: Denies numbness/tingling  APPEARANCE:  3 portal incisions with steri strips; no redness; no drainage; amount of edema consistent with 1 week post op    UPPER EXTREMITY ROM:  Passive ROM Right eval   Shoulder flexion 15   Shoulder extension    Shoulder abduction 10   Shoulder adduction    Shoulder extension    Shoulder internal rotation 45   Shoulder external rotation 0   Elbow flexion    Elbow extension    Wrist flexion    Wrist extension    Wrist ulnar deviation    Wrist radial deviation    Wrist pronation    Wrist supination     (Blank rows = not tested)   Active ROM Right eval Left eval 12/18 12/24 12/31  Right  Shoulder flexion Not tested secondary to surgical precautions WFLS 153 161 167  Shoulder extension       Shoulder abduction  WFLS 135 painful  150 161  Shoulder adduction       Shoulder internal rotation  WFLs Thumb to Coccyx   Thumb to L5  Shoulder external rotation  WFLs 78  86  Elbow flexion       Elbow extension       Wrist flexion       Wrist extension       Wrist ulnar deviation       Wrist radial deviation       Wrist pronation       Wrist supination       (  Blank rows = not tested)  UPPER EXTREMITY MMT:  MMT Right eval Left eval 12/18 R 12/31  Shoulder flexion Not tested secondary to surgical precautions 5 3+ 3+  Shoulder extension  5    Shoulder abduction  5 3- 3-  Shoulder adduction      Shoulder internal rotation  5 4 4   Shoulder external rotation  5 4 4   Middle trapezius      Lower trapezius      Elbow flexion  5    Elbow extension  5     Wrist flexion  5    Wrist extension  5    Wrist ulnar deviation      Wrist radial deviation      Wrist pronation      Wrist supination      Grip strength (lbs)      (Blank rows = not tested)                                                                                                                              TREATMENT DATE:  09/25/24 With TENS on concurrently with ex (pt's own unit) Landmine press red power cord seated 15x Supine flat: Shoulder flexion 1# 10x full extension of elbow  Incline 30 degrees table: Supine serratus punch # 20x  Supine arm at 90 degrees 2#: ABCs, circles, 12:00/6:00 and 9:00/3:00 Sidelying: Sidelying arm at 90 degrees: 1# circles, 12:00/6:00 and 9:00/3:00 Sidelying external rotation 10x 1#  Prone: Shoulder horizontal abduction small range 1# 10x; Shoulder scaption small range 1# 10x Row 2# 2x10 on Rt Shoulder ROM Standing: Wall push ups 10x Purple ball on wall ABCs (able to make it to A- Z) External rotation (elbow at side) 10x red band Internal rotation elbow at side 10x red band Red physioball wall roll ups with 10 sec holds at the top 10x Cone placement onto 2nd and 3rd shelves 2 minutes  09/13/24 With TENS on concurrently with ex (pt's own unit) Landmine press red power cord seated 15x Supine flat: Shoulder flexion 1# 10x (bent elbow to lower for comfort) Incline 30 degrees table: Supine serratus punch 2# 20x (increase to 3# next time) Supine arm at 90 degrees 2#: ABCs, circles, 12:00/6:00 and 9:00/3:00 Sidelying: Sidelying arm at 90 degrees: 1# circles, 12:00/6:00 and 9:00/3:00 Sidelying external rotation 10x 1#  Prone: Shoulder horizontal abduction small range 1# 10x; Shoulder scaption small range 1# 10x Shoulder ROM Standing: Wall push ups 10x Purple ball on wall ABCs (able to make it to A- Z) External rotation (elbow at side) 10x red band Internal rotation elbow at side 10x red band Red physioball wall roll ups with 10  sec holds at the top 10x Cone placement onto 2nd and 3rd shelves 2 minutes  09/06/24 ES IFC 11.0 ma  to right shoulder for duration of session for pain control Prone rows 2#  2 x 10 Rt  Prone shoulder extension 2# 2 x  10 Rt  Prone horizontal abduction approx 30 degrees 10x (small ROM) Incline 30 degrees table: Supine serratus punch 2# 10x  Supine shoulder flexion 10x; added 1# 5x (challenging and painful, modified with bent elbow and fewer reps)  Supine arm at 90 degrees 1#: ABCs, circles, 12:00/6:00 and 9:00/3:00 Sidelying: Shoulder abduction 5x (painful) Sidelying arm at 90 degrees: no weight circles, 12:00/6:00 and 9:00/3:00 Sidelying external rotation 10x no weight Standing: Wall push ups 10x Purple ball on wall circles 10x each way Attempted ball on wall and turn body but too painful Ball on wall shoulder abduction circles 5x (painful) External rotation (elbow at side) 10x yellow band Shoulder extension red band 10x 2 (anchored over the top of the door) Shoulder internal rotation 10x Pain 4-5/10 with TENS on In the doorway shoulder abduction up the door 10x (most comfortable position for abduction)    PATIENT EDUCATION: Education details: BERDINE, Educated patient on anatomy and physiology of current symptoms, prognosis, plan of care as well as initial self care strategies to promote recovery  Person educated: Patient Education method: Explanation Education comprehension: verbalized understanding  HOME EXERCISE PROGRAM: Access Code: [email or web address] URL: [email or web address] Date: 09/04/2024 Prepared by: Mliss  Exercises - Supine Cervical Retraction with Towel  - 2 x daily - 7 x weekly - 1 sets - 10 reps - 10 hold - Supine Cervical Rotation AROM on Pillow  - 2 x daily - 7 x weekly - 1 sets - 5 reps - 10 hold - Seated Scapular Retraction  - 2 x daily - 7 x weekly - 1 sets - 10 reps - Seated Cervical Rotation AROM  - 1 x daily - 7 x weekly - 2 sets - 10 reps -  Seated Cervical Sidebending AROM  - 1 x daily - 7 x weekly - 2 sets - 10 reps - Seated Cervical Retraction  - 1 x daily - 7 x weekly - 2 sets - 10 reps - Seated Gripping Towel  - 1 x daily - 7 x weekly - 2 sets - 10 reps - Wrist Flexion Extension AROM - Palms Down  - 1 x daily - 7 x weekly - 2 sets - 10 reps - Closing and Opening Hand With Shoulder Sling  - 1 x daily - 7 x weekly - 2 sets - 10 reps - Seated Shoulder Pendulum Exercise  - 1 x daily - 7 x weekly - 2 sets - 10 reps - Supine Shoulder Flexion AAROM  - 2 x daily - 7 x weekly - 2 sets - 10 reps - Prone Shoulder Row  - 2 x daily - 7 x weekly - 2 sets - 10 reps - Standing Isometric Shoulder External Rotation with Doorway  - 1 x daily - 7 x weekly - 1 sets - 5 reps - 5 hold - Standing Isometric Shoulder Abduction with Doorway - Arm Bent  - 1 x daily - 7 x weekly - 1 sets - 5 reps - 5 hold - Standing Isometric Shoulder Flexion with Doorway - Arm Bent  - 1 x daily - 7 x weekly - 1 sets - 5 reps - 5 hold - Standing Isometric Shoulder Extension with Doorway - Arm Bent  - 1 x daily - 7 x weekly - 1 sets - 5 reps - 5 hold - Shoulder Flexion Wall Slide with Towel  - 2-3 x daily - 7 x weekly - 2 sets - 10 reps - Seated Shoulder Flexion AAROM with Pulley Behind  -  1 x daily - 7 x weekly - 1 sets - 60-120 reps - Seated Shoulder Scaption AAROM with Pulley at Side  - 1 x daily - 7 x weekly - 1 sets - 60-120 reps - Shoulder External Rotation Reactive Isometrics (Mirrored)  - 1 x daily - 3 x weekly - 1-3 sets - 10 reps - Shoulder Internal Rotation Reactive Isometrics  - 1 x daily - 3 x weekly - 1-3 sets - 10 reps - Standing Shoulder Internal Rotation with Anchored Resistance (Mirrored)  - 1 x daily - 3 x weekly - 1-3 sets - 10 reps - Shoulder External Rotation with Anchored Resistance (Mirrored)  - 1 x daily - 3 x weekly - 1-3 sets - 10 reps - Prone Shoulder Row  - 1 x daily - 3 x weekly - 1-3 sets - 10 reps - Prone Shoulder Extension - Single Arm with  Dumbbell  - 1 x daily - 3-4 x weekly - 1-3 sets - 10 reps ASSESSMENT:  CLINICAL IMPRESSION: Pt reports 65% use of her Rt UE with daily tasks and continues to limit lifting heavy objects like groceries.  She wears TENs throughout session for pain management.  She is able to tolerate all exercises without increased pain.  PT provided verbal and tactile cues for alignment, scapular activation and speed of movement.  The patient would benefit from a continuation of skilled PT for a further progression of strengthening and functional mobility.  Will continue to update and promote independence in a HEP needed for a return to the highest functional level possible with ADLs.       OBJECTIVE IMPAIRMENTS: decreased activity tolerance, decreased ROM, decreased strength, increased edema, impaired perceived functional ability, impaired UE functional use, and pain.   ACTIVITY LIMITATIONS: carrying, lifting, sleeping, bed mobility, bathing, toileting, dressing, reach over head, and hygiene/grooming  PARTICIPATION LIMITATIONS: meal prep, cleaning, laundry, driving, shopping, community activity, and occupation  PERSONAL FACTORS: Time since onset of injury/illness/exacerbation are also affecting patient's functional outcome.   REHAB POTENTIAL: Good  CLINICAL DECISION MAKING: Stable/uncomplicated  EVALUATION COMPLEXITY: Low   GOALS: Goals reviewed with patient? Yes  SHORT TERM GOALS: Target date: 08/04/2024     The patient will demonstrate knowledge of basic self care strategies and exercises to promote healing  Baseline: Goal status: met 11/17  2.  The patient will report a 40% improvement in pain levels with functional activities which are currently difficult including sleeping, dressing, bathing Baseline:  Goal status: ONGOING, SLEEPING IN RECLINER 11/17  3.  Passive elevation to 150 degrees needed for overhead reaching Baseline:  Goal status: met 11/17  4.  Quick DASH functional outcome  score improved to 70% indicating improved function with less pain Baseline:  Goal status: MET (38.6% ) 08/21/2024      LONG TERM GOALS: Target date:12/06/2024    The patient will be independent in a safe self progression of a home exercise program to promote further recovery of function  Baseline:  Goal status: ongoing  2.  The patient will report a 75% improvement in pain levels with functional activities which are currently difficult including sleeping, grooming/dressing, return to work Baseline:  Goal status: ongoing 3.  The patient will have improved active shoulder elevation ROM to at least 130 degrees needed for grooming/dressing purposes as well as reaching high shelves  Baseline:  Goal status: met 12/24  4.  The patient will be have shoulder external and internal rotation to 40 degrees for dressing Baseline:  Goal  status: met 12/31 5.  The patient will have grossly 4/5 strength needed to lift and lower a 2# object from a high shelf  Baseline:  Goal status: ongoing  6.  Quick DASH functional outcome score improved to 60% indicating improved function with less pain Baseline:  Goal status: MET (38.6% ) 08/21/2024   7. Patient will shoulder muscular ROM and endurance to perform overhead activity for 1 minute  Able to cone stack x 2 min (09/25/24) NEW  8.  The patient will have UE strength to carry a 7# object (farmer's hold) for grocery shopping NEW  9. Quick DASH score improved to 20% indicating improved function with less pain NEW  PLAN:  PT FREQUENCY: 2x/week with tapering of visits  PT DURATION: 12 weeks  PLANNED INTERVENTIONS: 97164- PT Re-evaluation, 97750- Physical Performance Testing, 97110-Therapeutic exercises, 97530- Therapeutic activity, 97112- Neuromuscular re-education, 97535- Self Care, [id number]- Manual therapy, [phone]- Aquatic Therapy, [id number]- Electrical stimulation (unattended), [phone]- Electrical stimulation (manual), 97016- Vasopneumatic device,  Patient/Family education, Taping, Joint mobilization, Cryotherapy, and Moist heat  PLAN FOR NEXT SESSION: see how return to work goes; progressive strengthening and shoulder ROM;  TENS during ex  Burnard Joy, PT 09/25/2024 4:12 PM   [facility] [address], [address] [place], [place] [address] Phone # [phone] Fax [phone]  "

## 2024-09-27 ENCOUNTER — Ambulatory Visit: Admitting: Physician Assistant

## 2024-09-27 ENCOUNTER — Other Ambulatory Visit (HOSPITAL_COMMUNITY): Payer: Self-pay

## 2024-09-27 ENCOUNTER — Other Ambulatory Visit (HOSPITAL_BASED_OUTPATIENT_CLINIC_OR_DEPARTMENT_OTHER): Payer: Self-pay | Admitting: Family

## 2024-09-27 ENCOUNTER — Encounter: Payer: Self-pay | Admitting: Physician Assistant

## 2024-09-27 DIAGNOSIS — G4700 Insomnia, unspecified: Secondary | ICD-10-CM

## 2024-09-27 DIAGNOSIS — F4310 Post-traumatic stress disorder, unspecified: Secondary | ICD-10-CM

## 2024-09-27 DIAGNOSIS — F3341 Major depressive disorder, recurrent, in partial remission: Secondary | ICD-10-CM | POA: Diagnosis not present

## 2024-09-27 DIAGNOSIS — F411 Generalized anxiety disorder: Secondary | ICD-10-CM

## 2024-09-27 DIAGNOSIS — F515 Nightmare disorder: Secondary | ICD-10-CM | POA: Diagnosis not present

## 2024-09-27 DIAGNOSIS — E782 Mixed hyperlipidemia: Secondary | ICD-10-CM

## 2024-09-27 MED ORDER — GABAPENTIN 300 MG PO CAPS
900.0000 mg | ORAL_CAPSULE | Freq: Every day | ORAL | 3 refills | Status: AC
  Filled 2024-09-27 – 2024-10-12 (×3): qty 270, 90d supply, fill #0

## 2024-09-27 MED ORDER — ROSUVASTATIN CALCIUM 5 MG PO TABS
5.0000 mg | ORAL_TABLET | Freq: Every day | ORAL | 2 refills | Status: AC
  Filled 2024-09-27: qty 90, 90d supply, fill #0

## 2024-09-27 NOTE — Telephone Encounter (Signed)
 Lipid done on 04/05/24

## 2024-09-27 NOTE — Progress Notes (Signed)
 "     [facility]  Patient ID: Caroline Gonzales,  MRN: [id number]  PCP: Oris Camie BRAVO, NP  Date of Evaluation: 09/27/2024 Time spent:20 minutes  Chief Complaint:  Chief Complaint   Anxiety; Depression; Follow-up    HISTORY/CURRENT STATUS: HPI for routine follow-up.  Doing well as far as her meds go. She is able to enjoy things.  Energy and motivation are good.  Work is ok.  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  No extreme sadness, tearfulness, or feelings of hopelessness.  Sleeps ok. Nightmares aren't a problem.  ADLs and personal hygiene are normal.   Denies any changes in concentration, making decisions, or remembering things.  Appetite has not changed.  Weight is stable.  Anxiety is controlled w/ Klonopin . No mania, delirium, AH/VH.  No SI/HI.  Individual Medical History/ Review of Systems: Changes? :Yes    had rotator cuff and bicep repair right shoulder a few months ago, still in PT, and just went back to work last week.   Past medications for mental health diagnoses include: Prozac, Paxil, Effexor, Norpramin, Wellbutrin, doxepin, Elavil, Geodon, Abilify, Lexapro, Trileptal, Ambien, Lunesta, Sonata, trazodone, Restoril, Remeron, Seroquel , Klonopin , propranolol , Lamictal , Deplin, Latuda, Xanax  made her feel horrible, Ativan  didn't help, prazosin    Allergies: Bacitracin, Nsaids, Penicillins, Tamiflu [oseltamivir phosphate], Cephalosporins, and Latex  Current Medications:  Current Outpatient Medications:    Biotin 5 MG TABS, Take by mouth., Disp: , Rfl:    cetirizine  (ZYRTEC ) 10 MG tablet, Take 10 mg by mouth at bedtime., Disp: , Rfl:    Cholecalciferol (VITAMIN D3) 5000 units CAPS, Take 5,000 Units by mouth at bedtime., Disp: , Rfl:    clonazePAM  (KLONOPIN ) 0.5 MG tablet, Take 1 tablet (0.5 mg) by mouth every 6 hours as needed for anxiety.  (up to 4  tablets daily), Disp: 120 tablet, Rfl: 4   cyclobenzaprine  (FLEXERIL ) 5 MG tablet, Take 1 tablet (5 mg total) by mouth every 8 (eight) hours as needed, for muscle spasms, Disp: 40 tablet, Rfl: 1   cyclobenzaprine  (FLEXERIL ) 5 MG tablet, Take 1 tablet by mouth every 8 hours as needed, for muscle spasms., Disp: 40 tablet, Rfl: 1   estradiol  (VIVELLE -DOT) 0.025 MG/24HR, Place 1 patch onto the skin 2 (two) times a week., Disp: 8 patch, Rfl: 12   fluvoxaMINE  (LUVOX ) 100 MG tablet, Take 1 tablet by mouth at bedtime. Take with 50 mg dose to equal 150 mg at bedtime, Disp: 90 tablet, Rfl: 3   fluvoxaMINE  (LUVOX ) 50 MG tablet, TAKE 1 TABLET BY MOUTH AT BEDTIME. TAKE IN ADDITION TO THE 100 MG TABLET TO EQUAL 150 MG TOTAL, Disp: 90 tablet, Rfl: 3   metFORMIN  (GLUCOPHAGE ) 500 MG tablet, Take 1 tablet (500 mg total) by mouth daily with a meal., Disp: 90 tablet, Rfl: 0   Multiple Vitamin (MULTIVITAMIN) capsule, Take 1 capsule by mouth daily. bariatric, Disp: , Rfl:    pantoprazole  (PROTONIX ) 40 MG tablet, Take 1 tablet (40 mg total) by mouth daily., Disp: 90 tablet, Rfl: 0   prazosin  (MINIPRESS ) 5 MG capsule, Take 2 capsules (10 mg total) by mouth at bedtime., Disp: 180 capsule, Rfl: 0   propranolol  (INDERAL ) 40 MG tablet, Take 1 tablet (40 mg total) by mouth 3 (three) times daily., Disp: 270 tablet, Rfl: 3   QUEtiapine  (SEROQUEL ) 200 MG tablet, Take  2 tablets (400 mg total) by mouth at bedtime., Disp: 180 tablet, Rfl: 3   triamcinolone  (NASACORT ) 55 MCG/ACT AERO nasal inhaler, Place 1 spray into the nose at bedtime., Disp: , Rfl:    Continuous Glucose Sensor (FREESTYLE LIBRE 3 PLUS SENSOR) MISC, Change sensor every 15 days. (Patient not taking: Reported on 06/09/2024), Disp: 2 each, Rfl: 3   gabapentin  (NEURONTIN ) 300 MG capsule, Take 3 capsules (900 mg total) by mouth at bedtime., Disp: 270 capsule, Rfl: 3   HYDROcodone -acetaminophen  (NORCO/VICODIN) 5-325 MG tablet, Take 1 tablet by mouth every 6 (six) hours as  needed for moderate pain. (Patient not taking: Reported on 09/27/2024), Disp: 20 tablet, Rfl: 0   ketoconazole  (NIZORAL ) 2 % cream, Apply 1 Application topically 2 (two) times daily., Disp: 30 g, Rfl: 6   ondansetron  (ZOFRAN -ODT) 4 MG disintegrating tablet, Take 1 tablet (4 mg total) by mouth every 8 (eight) hours as needed., Disp: 20 tablet, Rfl: 0   ondansetron  (ZOFRAN -ODT) 8 MG disintegrating tablet, Dissolve 1 tablet by mouth every 8 hours as needed for nausea., Disp: 30 tablet, Rfl: 1   oxyCODONE  (OXY IR/ROXICODONE ) 5 MG immediate release tablet, Take 1 tablet every 6 hours by oral route as needed, for moderate to severe pain., Disp: 20 tablet, Rfl: 0   oxyCODONE  (ROXICODONE ) 5 MG immediate release tablet, Take 1 tablet (5 mg total) by mouth every 4 (four) hours as needed for moderate pain (pain score 4-6) or severe pain (pain score 7-10)., Disp: 30 tablet, Rfl: 0   Prasterone  (INTRAROSA ) 6.5 MG INST, Place 1 suppository vaginally at bedtime as needed. (Patient not taking: Reported on 06/09/2024), Disp: 30 each, Rfl: 12   rosuvastatin  (CRESTOR ) 5 MG tablet, Take 1 tablet (5 mg total) by mouth daily., Disp: 90 tablet, Rfl: 2 Medication Side Effects: none  Family Medical/ Social History: Changes?      MENTAL HEALTH EXAM:  Last menstrual period 01/06/2021.There is no height or weight on file to calculate BMI.  General Appearance: Casual and Well Groomed  Eye Contact:  Good  Speech:  Clear and Coherent and Normal Rate  Volume:  Normal  Mood:  Euthymic  Affect:  Congruent  Thought Process:  Goal Directed and Descriptions of Associations: Circumstantial  Orientation:  Full (Time, Place, and Person)  Thought Content: Logical   Suicidal Thoughts:  No  Homicidal Thoughts:  No  Memory:  WNL  Judgement:  Good  Insight:  Good  Psychomotor Activity:  Normal  Concentration:  Concentration: Good and Attention Span: Good  Recall:  Good  Fund of Knowledge: Good  Language: Good  Assets:   Communication Skills Desire for Improvement Financial Resources/Insurance Housing Resilience Transportation Vocational/Educational  ADL's:  Intact  Cognition: WNL  Prognosis:  Good   PCP follows labs  DIAGNOSES:    ICD-10-CM   1. PTSD (post-traumatic stress disorder)  F43.10     2. Insomnia, unspecified type  G47.00     3. Generalized anxiety disorder  F41.1     4. Recurrent major depressive disorder, in partial remission  F33.41     5. Nightmares  F51.5       Receiving Psychotherapy: Yes  Heather Mask  RECOMMENDATIONS:  PDMP was reviewed.  Last Klonopin  filled 09/22/2024.  Gabapentin  filled 07/13/2024. I provided approximately 20 minutes of face to face time during this encounter, including time spent before and after the visit in records review, medical decision making, counseling pertinent to today's visit, and charting.   Caroline Gonzales is doing well  on her current meds so no changes are needed.   Continue Klonopin  0.5 mg, 1 p.o. 4 times daily as needed. (She takes 3 at bedtime routinely, and 1 every day prn.  Continue Luvox  150 mg nightly. Continue Gabapentin  300 mg, 3 at bedtime. Continue prazosin  5 mg, 2 po qhs.  Continue propranolol  40 mg, 1 p.o. tid.  Continue Seroquel  200 mg, 2 at bedtime.  Continue counseling with Heather Mask. Return in 6 months.  Verneita Cooks, PA-C  "

## 2024-09-28 ENCOUNTER — Other Ambulatory Visit: Payer: Self-pay

## 2024-09-28 ENCOUNTER — Other Ambulatory Visit (HOSPITAL_COMMUNITY): Payer: Self-pay

## 2024-10-01 ENCOUNTER — Other Ambulatory Visit (HOSPITAL_COMMUNITY): Payer: Self-pay

## 2024-10-02 ENCOUNTER — Other Ambulatory Visit (HOSPITAL_COMMUNITY): Payer: Self-pay

## 2024-10-02 ENCOUNTER — Ambulatory Visit

## 2024-10-04 ENCOUNTER — Ambulatory Visit

## 2024-10-04 DIAGNOSIS — M25511 Pain in right shoulder: Secondary | ICD-10-CM

## 2024-10-04 DIAGNOSIS — M6281 Muscle weakness (generalized): Secondary | ICD-10-CM

## 2024-10-04 DIAGNOSIS — R600 Localized edema: Secondary | ICD-10-CM

## 2024-10-04 DIAGNOSIS — M25611 Stiffness of right shoulder, not elsewhere classified: Secondary | ICD-10-CM

## 2024-10-04 DIAGNOSIS — R293 Abnormal posture: Secondary | ICD-10-CM

## 2024-10-04 NOTE — Therapy (Addendum)
 " OUTPATIENT PHYSICAL THERAPY SHOULDER TREATMENT/RECERTIFICATION   Patient Name: Caroline Gonzales MRN: [id number] DOB:1975/11/20, 49 y.o., female Today's Date: 10/04/2024  END OF SESSION:  PT End of Session - 10/04/24 1703     Visit Number 23    Date for Recertification  12/06/24    Authorization Type [employer]    PT Start Time 1615    PT Stop Time 1658    PT Time Calculation (min) 43 min    Activity Tolerance Patient tolerated treatment well    Behavior During Therapy [facility] for tasks assessed/performed                    Past Medical History:  Diagnosis Date   Acute costochondritis 11/14/2021   Allergy    Zyrtec .   Anxiety    followed by Dr. Melba   Back pain    Complication of anesthesia    PONV   Depression    Elevated blood pressure reading without diagnosis of hypertension 12/29/2021   Fibroid    Gastroparesis 09/14/2012   gastric emptying study in 2014   GERD (gastroesophageal reflux disease)    Headache    Hypertriglyceridemia 10/07/2017   Nightmares 07/21/2018   Pneumonia    2013ish   PONV (postoperative nausea and vomiting)     likes scopolamine  patch and zofran  /phenergan  helps   Pre-diabetes    PTSD (post-traumatic stress disorder)    Residual foreign body in soft tissue 11/12/2016   Screening for colon cancer 07/02/2022   Sleep apnea 07/01/2018   cpap optional, pt close to not use cpap   Vitamin D  deficiency    Wears glasses    Past Surgical History:  Procedure Laterality Date   ABDOMINAL HYSTERECTOMY  01/2021   ANKLE ARTHROSCOPY Left 2011   BICEPT TENODESIS Right 06/16/2024   Procedure: TENODESIS, BICEPS;  Surgeon: Caroline Selinda Dover, MD;  Location: [facility];  Service: Orthopedics;  Laterality: Right;   CESAREAN SECTION  06/2006   x 1   CHOLECYSTECTOMY  07/2006   laparoscopic   CYSTOSCOPY N/A 02/04/2021   Procedure: CYSTOSCOPY;  Surgeon: Caroline Kate Norris, MD;  Location: [facility] OR;  Service: Gynecology;  Laterality:  N/A;   DILATION AND CURETTAGE OF UTERUS  1997   x 2   FOREIGN BODY REMOVAL Left 11/18/2016   Procedure: REMOVAL FOREIGN BODY EXTREMITY LEFT FOOT;  Surgeon: Caroline [facility], DPM;  Location: [facility] OR;  Service: Podiatry;  Laterality: Left;   GASTRIC ROUX-EN-Y N/A 04/13/2023   Procedure: LAPAROSCOPIC ROUX-EN-Y GASTRIC BYPASS WITH UPPER ENDOSCOPY;  Surgeon: Caroline Mitzie LABOR, MD;  Location: [facility];  Service: General;  Laterality: N/A;   KNEE ARTHROSCOPY WITH MEDIAL MENISECTOMY Right 09/10/2021   Procedure: KNEE ARTHROSCOPY WITH MEDIAL MENISCAL ROOT REPAIR;  Surgeon: Caroline Bonner DASEN, MD;  Location: [facility];  Service: Orthopedics;  Laterality: Right;   PILONIDAL CYST EXCISION  1990's   RADIOLOGY WITH ANESTHESIA N/A 11/10/2018   Procedure: MRI WITH ANESTHESIA OF BRAIN AND ORBITS WITH AND WITHOUT CONTRAST;  Surgeon: Radiologist, Medication, MD;  Location: [facility] OR;  Service: Radiology;  Laterality: N/A;   SHOULDER ARTHROSCOPY WITH ROTATOR CUFF REPAIR Right 06/16/2024   Procedure: ARTHROSCOPY, SHOULDER, WITH ROTATOR CUFF REPAIR;  Surgeon: Caroline Selinda Dover, MD;  Location: [facility];  Service: Orthopedics;  Laterality: Right;   SUBACROMIAL DECOMPRESSION Right 06/16/2024   Procedure: DECOMPRESSION, SUBACROMIAL SPACE;  Surgeon: Caroline Selinda Dover, MD;  Location: [facility];  Service: Orthopedics;  Laterality: Right;  Right shoulder arthroscopy with rotator cuff repair, biceps tenodesis, subacromial decompression, distal clavicle excision   TENDON REPAIR Left 2011   Left Ankle   TOTAL LAPAROSCOPIC HYSTERECTOMY WITH SALPINGECTOMY Bilateral 02/04/2021   Procedure: TOTAL LAPAROSCOPIC HYSTERECTOMY WITH SALPINGECTOMY;  Surgeon: Caroline Kate Norris, MD;  Location: [facility];  Service: Gynecology;  Laterality: Bilateral;   UPPER GASTROINTESTINAL ENDOSCOPY     WISDOM TOOTH EXTRACTION  1990's   Patient Active Problem List   Diagnosis Date Noted   History of nocturnal  hypoglycemia 03/02/2024   Encounter for annual physical exam 03/02/2024   Family history of elevated lipoprotein (a) 04/26/2023   PVC's (premature ventricular contractions) 04/26/2023   Morbid obesity (HCC) 04/13/2023   Gastroesophageal reflux disease 12/24/2022   Abnormal laboratory test 12/24/2022   Chest wall pain 12/24/2022   Change in facial mole 12/29/2021   S/P laparoscopic hysterectomy 02/04/2021   PTSD (post-traumatic stress disorder) 07/21/2018   GAD (generalized anxiety disorder) 07/21/2018   Insomnia 07/21/2018   Recurrent major depressive disorder, in partial remission 07/21/2018   Insulin  resistance 10/21/2017   Hypertriglyceridemia 10/07/2017   Vitamin D  deficiency 10/07/2017   Pre-diabetes 11/02/2016   Morbid obesity with body mass index (BMI) of 50.0 to 59.9 in adult (HCC) 10/15/2016    PCP: Caroline Credit NP  REFERRING PROVIDER: Sharl Mayo MD  REFERRING DIAG: s/p right shoulder scope with rotator cuff repair, biceps tenodesis and subacromial decompression  THERAPY DIAG: right shoulder pain; right shoulder stiffness; weakness   Rationale for Evaluation and Treatment: Rehabilitation  ONSET DATE: Spring 2025  SUBJECTIVE:                                                                                                                                                                                      SUBJECTIVE STATEMENT: [name] reports she was feeling a lot of pain last night, today she is at 3/10. She reports she did a lot of laundry this weekend that may have caused the flare up. She is wearing TENs unit today, which she reports is still helping her a lot with pain.  Hand dominance: Right  Date of surgery 06/16/2024 s/p right shoulder scope with rotator cuff repair, biceps tenodesis and subacromial decompression Pt reports that MD advised her that she could sleep with sling off with pillow support and that she may have her arm straight with support A/ROM to  begin at 6 weeks  PERTINENT HISTORY: Right shoulder scope with rotator cuff repair, biceps tenodesis, and subacromial decompression on 06/16/24 Knee scope 12/29/23 Can't take NSAIDS b/c of gastric bypass  PAIN:  10/04/2024  Are you having pain? Yes NPRS scale: 0/10-6/10  Pain location: right elbow - achy comes and goes - located at lateral epicondyle, right shoulder achy Pain orientation: Right  PAIN TYPE: aching Pain description: constant  Aggravating factors: repetition,  Relieving factors: medication   PRECAUTIONS: Shoulder surgical   WEIGHT BEARING RESTRICTIONS: No  FALLS:  Has patient fallen in last 6 months? 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  PLOF: Independent  PATIENT GOALS:baseline be 75% ; shopping, goes to [employer] stretching class in the [place]   NEXT MD VISIT:   OBJECTIVE:  Note: Objective measures were completed at Evaluation unless otherwise noted.  DIAGNOSTIC FINDINGS:  Full thickness tear of subscapularis and supraspinatus and biceps tear  PATIENT SURVEYS:  Quick Dash:  QUICK DASH  Please rate your ability do the following activities in the last week by selecting the number below the appropriate response.   Activities Rating  Open a tight or new jar.  5 = Unable  Do heavy household chores (e.g., wash walls, floors). 5 = Unable  Carry a shopping bag or briefcase 5 = Unable  Wash your back. 5 = Unable  Use a knife to cut food. 5 = Unable  Recreational activities in which you take some force or impact through your arm, shoulder or hand (e.g., golf, hammering, tennis, etc.). 5 = Unable  During the past week, to what extent has your arm, shoulder or hand problem interfered with your normal social activities with family, friends, neighbors or groups?  4 = Quite a bit  During the past week, were you  limited in your work or other regular daily activities as a result of your arm, shoulder or hand problem? 5 = Unable  Rate the severity of the following symptoms in the last week: Arm, Shoulder, or hand pain. 4 = Severe  Rate the severity of the following symptoms in the last week: Tingling (pins and needles) in your arm, shoulder or hand. 1 = none  During the past week, how much difficulty have you had sleeping because of the pain in your arm, shoulder or hand?  3 = Moderate difficulty   (A QuickDASH score may not be calculated if there is greater than 1 missing item.)  Quick Dash Disability/Symptom Score: 81.8%   Minimally Clinically Important Difference (MCID): 15-20 points    08/21/2024 Quick DASH : 38.6/100 38.6%   12/31: 29.5%  COGNITION: Overall cognitive status: Within functional limits for tasks assessed     SENSATION: Denies numbness/tingling  APPEARANCE:  3 portal incisions with steri strips; no redness; no drainage; amount of edema consistent with 1 week post op    UPPER EXTREMITY ROM:  Passive ROM Right eval   Shoulder flexion 15   Shoulder extension    Shoulder abduction 10   Shoulder adduction    Shoulder extension    Shoulder internal rotation 45   Shoulder external rotation 0   Elbow flexion    Elbow extension    Wrist flexion    Wrist extension    Wrist ulnar deviation    Wrist radial deviation    Wrist pronation    Wrist supination     (Blank rows = not tested)   Active ROM Right eval Left eval 12/18 12/24 12/31  Right  Shoulder flexion Not tested secondary to surgical precautions WFLS 153 161 167  Shoulder extension       Shoulder abduction  WFLS 135 painful  150 161  Shoulder adduction       Shoulder internal rotation  WFLs Thumb to Coccyx   Thumb to L5  Shoulder external rotation  WFLs 78  86  Elbow flexion       Elbow extension       Wrist flexion       Wrist extension       Wrist ulnar deviation       Wrist radial deviation        Wrist pronation       Wrist supination       (Blank rows = not tested)  UPPER EXTREMITY MMT:  MMT Right eval Left eval 12/18 R 12/31  Shoulder flexion Not tested secondary to surgical precautions 5 3+ 3+  Shoulder extension  5    Shoulder abduction  5 3- 3-  Shoulder adduction      Shoulder internal rotation  5 4 4   Shoulder external rotation  5 4 4   Middle trapezius      Lower trapezius      Elbow flexion  5    Elbow extension  5    Wrist flexion  5    Wrist extension  5    Wrist ulnar deviation      Wrist radial deviation      Wrist pronation      Wrist supination      Grip strength (lbs)      (Blank rows = not tested)                                                                                                                              TREATMENT DATE:  10/04/2024: Supine serratus punch with 2# weight x20 Supine arm at 90 with 2#: ABCs, 12:00/6:00 and 9:00/3:00 2x10 Supine shoulder flexion 1# x10 Supine shoulder horizontal abduction with blue tband 2x12 Supine shoulder ER with green tband (decreased ROM) x12, x5 Supine shoulder retraction x20 Seated ball rolls out on blue ball 2x10 Sidelying ER (no weight because of pain) 2x20 Sidelying 12:00/6:00 and 9:00/3:00 and circles with 1# weight 2x10 Standing little blue ball ABCs (able to go A-Z) Wall push up plus 2x10 Standing rows with red band 2x15 Standing IR with red band from neutral x8 (1 set due to pain)   09/25/24 With TENS on concurrently with ex (pt's own unit) Landmine press red power cord seated 15x Supine flat: Shoulder flexion 1# 10x full extension of elbow  Incline 30 degrees table: Supine serratus punch # 20x  Supine arm at 90 degrees 2#: ABCs, circles, 12:00/6:00 and 9:00/3:00 Sidelying: Sidelying arm at 90 degrees: 1# circles, 12:00/6:00 and 9:00/3:00 Sidelying external rotation 10x 1#  Prone: Shoulder horizontal abduction small range 1# 10x; Shoulder scaption small range 1# 10x Row 2#  2x10 on Rt Shoulder ROM Standing: Wall push ups 10x Purple ball on wall ABCs (able to make it to A- Z) External rotation (  elbow at side) 10x red band Internal rotation elbow at side 10x red band Red physioball wall roll ups with 10 sec holds at the top 10x Cone placement onto 2nd and 3rd shelves 2 minutes  09/13/24 With TENS on concurrently with ex (pt's own unit) Landmine press red power cord seated 15x Supine flat: Shoulder flexion 1# 10x (bent elbow to lower for comfort) Incline 30 degrees table: Supine serratus punch 2# 20x (increase to 3# next time) Supine arm at 90 degrees 2#: ABCs, circles, 12:00/6:00 and 9:00/3:00 Sidelying: Sidelying arm at 90 degrees: 1# circles, 12:00/6:00 and 9:00/3:00 Sidelying external rotation 10x 1#  Prone: Shoulder horizontal abduction small range 1# 10x; Shoulder scaption small range 1# 10x Shoulder ROM Standing: Wall push ups 10x Purple ball on wall ABCs (able to make it to A- Z) External rotation (elbow at side) 10x red band Internal rotation elbow at side 10x red band Red physioball wall roll ups with 10 sec holds at the top 10x Cone placement onto 2nd and 3rd shelves 2 minutes   PATIENT EDUCATION: Education details: BERDINE, Educated patient on anatomy and physiology of current symptoms, prognosis, plan of care as well as initial self care strategies to promote recovery  Person educated: Patient Education method: Explanation Education comprehension: verbalized understanding  HOME EXERCISE PROGRAM: Access Code: [email or web address] URL: [email or web address] Date: 09/04/2024 Prepared by: Mliss  Exercises - Supine Cervical Retraction with Towel  - 2 x daily - 7 x weekly - 1 sets - 10 reps - 10 hold - Supine Cervical Rotation AROM on Pillow  - 2 x daily - 7 x weekly - 1 sets - 5 reps - 10 hold - Seated Scapular Retraction  - 2 x daily - 7 x weekly - 1 sets - 10 reps - Seated Cervical Rotation AROM  - 1 x daily - 7 x weekly - 2  sets - 10 reps - Seated Cervical Sidebending AROM  - 1 x daily - 7 x weekly - 2 sets - 10 reps - Seated Cervical Retraction  - 1 x daily - 7 x weekly - 2 sets - 10 reps - Seated Gripping Towel  - 1 x daily - 7 x weekly - 2 sets - 10 reps - Wrist Flexion Extension AROM - Palms Down  - 1 x daily - 7 x weekly - 2 sets - 10 reps - Closing and Opening Hand With Shoulder Sling  - 1 x daily - 7 x weekly - 2 sets - 10 reps - Seated Shoulder Pendulum Exercise  - 1 x daily - 7 x weekly - 2 sets - 10 reps - Supine Shoulder Flexion AAROM  - 2 x daily - 7 x weekly - 2 sets - 10 reps - Prone Shoulder Row  - 2 x daily - 7 x weekly - 2 sets - 10 reps - Standing Isometric Shoulder External Rotation with Doorway  - 1 x daily - 7 x weekly - 1 sets - 5 reps - 5 hold - Standing Isometric Shoulder Abduction with Doorway - Arm Bent  - 1 x daily - 7 x weekly - 1 sets - 5 reps - 5 hold - Standing Isometric Shoulder Flexion with Doorway - Arm Bent  - 1 x daily - 7 x weekly - 1 sets - 5 reps - 5 hold - Standing Isometric Shoulder Extension with Doorway - Arm Bent  - 1 x daily - 7 x weekly - 1 sets - 5 reps - 5 hold - Shoulder  Flexion Wall Slide with Towel  - 2-3 x daily - 7 x weekly - 2 sets - 10 reps - Seated Shoulder Flexion AAROM with Pulley Behind  - 1 x daily - 7 x weekly - 1 sets - 60-120 reps - Seated Shoulder Scaption AAROM with Pulley at Side  - 1 x daily - 7 x weekly - 1 sets - 60-120 reps - Shoulder External Rotation Reactive Isometrics (Mirrored)  - 1 x daily - 3 x weekly - 1-3 sets - 10 reps - Shoulder Internal Rotation Reactive Isometrics  - 1 x daily - 3 x weekly - 1-3 sets - 10 reps - Standing Shoulder Internal Rotation with Anchored Resistance (Mirrored)  - 1 x daily - 3 x weekly - 1-3 sets - 10 reps - Shoulder External Rotation with Anchored Resistance (Mirrored)  - 1 x daily - 3 x weekly - 1-3 sets - 10 reps - Prone Shoulder Row  - 1 x daily - 3 x weekly - 1-3 sets - 10 reps - Prone Shoulder Extension -  Single Arm with Dumbbell  - 1 x daily - 3-4 x weekly - 1-3 sets - 10 reps ASSESSMENT:  CLINICAL IMPRESSION:  Eriko reports increased pain today since last session, she states she may have aggravated it when doing laundry this weekend. She wore TENs throughout session for pain management. Exercises were modified today because of her pain to more supine exercises which she tolerated well with some decrease in weight based on patient response. She was able to complete the exercises plan with the modifications. Patient reports fatiguing of the rotator cuff muscles with minimal weight. The patient would benefit from a continuation of skilled PT for a further progression of strengthening and functional mobility.     OBJECTIVE IMPAIRMENTS: decreased activity tolerance, decreased ROM, decreased strength, increased edema, impaired perceived functional ability, impaired UE functional use, and pain.   ACTIVITY LIMITATIONS: carrying, lifting, sleeping, bed mobility, bathing, toileting, dressing, reach over head, and hygiene/grooming  PARTICIPATION LIMITATIONS: meal prep, cleaning, laundry, driving, shopping, community activity, and occupation  PERSONAL FACTORS: Time since onset of injury/illness/exacerbation are also affecting patient's functional outcome.   REHAB POTENTIAL: Good  CLINICAL DECISION MAKING: Stable/uncomplicated  EVALUATION COMPLEXITY: Low   GOALS: Goals reviewed with patient? Yes  SHORT TERM GOALS: Target date: 08/04/2024     The patient will demonstrate knowledge of basic self care strategies and exercises to promote healing  Baseline: Goal status: met 11/17  2.  The patient will report a 40% improvement in pain levels with functional activities which are currently difficult including sleeping, dressing, bathing Baseline:  Goal status: ONGOING, SLEEPING IN RECLINER 11/17  3.  Passive elevation to 150 degrees needed for overhead reaching Baseline:  Goal status: met  11/17  4.  Quick DASH functional outcome score improved to 70% indicating improved function with less pain Baseline:  Goal status: MET (38.6% ) 08/21/2024      LONG TERM GOALS: Target date:12/06/2024    The patient will be independent in a safe self progression of a home exercise program to promote further recovery of function  Baseline:  Goal status: ongoing  2.  The patient will report a 75% improvement in pain levels with functional activities which are currently difficult including sleeping, grooming/dressing, return to work Baseline:  Goal status: ongoing 3.  The patient will have improved active shoulder elevation ROM to at least 130 degrees needed for grooming/dressing purposes as well as reaching high shelves  Baseline:  Goal status: met 12/24  4.  The patient will be have shoulder external and internal rotation to 40 degrees for dressing Baseline:  Goal status: met 12/31 5.  The patient will have grossly 4/5 strength needed to lift and lower a 2# object from a high shelf  Baseline:  Goal status: ongoing  6.  Quick DASH functional outcome score improved to 60% indicating improved function with less pain Baseline:  Goal status: MET (38.6% ) 08/21/2024   7. Patient will shoulder muscular ROM and endurance to perform overhead activity for 1 minute  Able to cone stack x 2 min (09/25/24) NEW  8.  The patient will have UE strength to carry a 7# object (farmer's hold) for grocery shopping NEW  9. Quick DASH score improved to 20% indicating improved function with less pain NEW  PLAN:  PT FREQUENCY: 2x/week with tapering of visits  PT DURATION: 12 weeks  PLANNED INTERVENTIONS: 97164- PT Re-evaluation, 97750- Physical Performance Testing, 97110-Therapeutic exercises, 97530- Therapeutic activity, 97112- Neuromuscular re-education, 97535- Self Care, [id number]- Manual therapy, [id number]- Aquatic Therapy, [id number]- Electrical stimulation (unattended), [id number]- Electrical stimulation  (manual), 97016- Vasopneumatic device, Patient/Family education, Taping, Joint mobilization, Cryotherapy, and Moist heat  PLAN FOR NEXT SESSION: see how return to work goes; progressive strengthening and shoulder ROM;  TENS during ex, quick DASH  Daisja Kessinger, SPT 10/04/24, 5:07 PM  I agree with the following treatment note after reviewing documentation. This session was performed under the supervision of a licensed clinician.  Burnard Joy, PT 10/04/24 5:09 PM   [facility] [address], [address] [place], [place] [address] Phone # [phone] Fax [phone]  "

## 2024-10-09 ENCOUNTER — Ambulatory Visit

## 2024-10-09 ENCOUNTER — Telehealth: Payer: Self-pay

## 2024-10-09 NOTE — Telephone Encounter (Signed)
 Called pt to reschedule PT appt that was canceled 10/09/24 due to weather.  Left voicemail.

## 2024-10-10 ENCOUNTER — Other Ambulatory Visit: Payer: Self-pay

## 2024-10-10 ENCOUNTER — Other Ambulatory Visit (HOSPITAL_COMMUNITY): Payer: Self-pay

## 2024-10-11 ENCOUNTER — Ambulatory Visit

## 2024-10-12 ENCOUNTER — Encounter: Payer: Self-pay | Admitting: Nurse Practitioner

## 2024-10-12 ENCOUNTER — Other Ambulatory Visit: Payer: Self-pay

## 2024-10-16 ENCOUNTER — Ambulatory Visit

## 2024-10-17 ENCOUNTER — Other Ambulatory Visit: Payer: Self-pay | Admitting: Obstetrics and Gynecology

## 2024-10-17 ENCOUNTER — Other Ambulatory Visit (HOSPITAL_COMMUNITY): Payer: Self-pay

## 2024-10-18 ENCOUNTER — Ambulatory Visit

## 2024-10-18 ENCOUNTER — Other Ambulatory Visit: Payer: Self-pay | Admitting: Obstetrics and Gynecology

## 2024-10-18 ENCOUNTER — Other Ambulatory Visit (HOSPITAL_COMMUNITY): Payer: Self-pay

## 2024-10-18 DIAGNOSIS — M25511 Pain in right shoulder: Secondary | ICD-10-CM

## 2024-10-18 DIAGNOSIS — R293 Abnormal posture: Secondary | ICD-10-CM

## 2024-10-18 DIAGNOSIS — R600 Localized edema: Secondary | ICD-10-CM

## 2024-10-18 DIAGNOSIS — M6281 Muscle weakness (generalized): Secondary | ICD-10-CM

## 2024-10-18 DIAGNOSIS — M25611 Stiffness of right shoulder, not elsewhere classified: Secondary | ICD-10-CM

## 2024-10-18 MED ORDER — ESTRADIOL 0.025 MG/24HR TD PTTW
1.0000 | MEDICATED_PATCH | TRANSDERMAL | 0 refills | Status: DC
  Filled 2024-10-18: qty 8, 28d supply, fill #0

## 2024-10-18 NOTE — Telephone Encounter (Signed)
 Med refill request:    estradiol  (VIVELLE -DOT) 0.025 MG/24HR  Start:  11/04/23 Disp: 8 patches Refills:  0 of 12 remaining  Last AEX:  11/02/23 Next AEX:  11/02/24 Last MMG (if hormonal med): 08/27/23 Refill authorized? Please Advise.

## 2024-10-18 NOTE — Therapy (Signed)
 " OUTPATIENT PHYSICAL THERAPY SHOULDER TREATMENT/RECERTIFICATION   Patient Name: Caroline Gonzales MRN: [id number] DOB:[birth year], 49 y.o., female Today's Date: 10/18/2024  END OF SESSION:  PT End of Session - 10/18/24 1653     Visit Number 24    Date for Recertification  12/06/24    Authorization Type [employer]    PT Start Time 1600    PT Stop Time 1642    PT Time Calculation (min) 42 min    Activity Tolerance Patient tolerated treatment well    Behavior During Therapy [facility] for tasks assessed/performed                     Past Medical History:  Diagnosis Date   Acute costochondritis 11/14/2021   Allergy    Zyrtec .   Anxiety    followed by Dr. Melba   Back pain    Complication of anesthesia    PONV   Depression    Elevated blood pressure reading without diagnosis of hypertension 12/29/2021   Fibroid    Gastroparesis 09/14/2012   gastric emptying study in 2014   GERD (gastroesophageal reflux disease)    Headache    Hypertriglyceridemia 10/07/2017   Nightmares 07/21/2018   Pneumonia    2013ish   PONV (postoperative nausea and vomiting)     likes scopolamine  patch and zofran  /phenergan  helps   Pre-diabetes    PTSD (post-traumatic stress disorder)    Residual foreign body in soft tissue 11/12/2016   Screening for colon cancer 07/02/2022   Sleep apnea 07/01/2018   cpap optional, pt close to not use cpap   Vitamin D  deficiency    Wears glasses    Past Surgical History:  Procedure Laterality Date   ABDOMINAL HYSTERECTOMY  01/2021   ANKLE ARTHROSCOPY Left 2011   BICEPT TENODESIS Right 06/16/2024   Procedure: TENODESIS, BICEPS;  Surgeon: Sharl Selinda Dover, MD;  Location: [facility];  Service: Orthopedics;  Laterality: Right;   CESAREAN SECTION  06/2006   x 1   CHOLECYSTECTOMY  07/2006   laparoscopic   CYSTOSCOPY N/A 02/04/2021   Procedure: CYSTOSCOPY;  Surgeon: Jannis Kate Norris, MD;  Location: [facility] OR;  Service: Gynecology;  Laterality:  N/A;   DILATION AND CURETTAGE OF UTERUS  1997   x 2   FOREIGN BODY REMOVAL Left 11/18/2016   Procedure: REMOVAL FOREIGN BODY EXTREMITY LEFT FOOT;  Surgeon: Donnice JONELLE Fees, DPM;  Location: [facility] OR;  Service: Podiatry;  Laterality: Left;   GASTRIC ROUX-EN-Y N/A 04/13/2023   Procedure: LAPAROSCOPIC ROUX-EN-Y GASTRIC BYPASS WITH UPPER ENDOSCOPY;  Surgeon: Signe Mitzie LABOR, MD;  Location: [facility];  Service: General;  Laterality: N/A;   KNEE ARTHROSCOPY WITH MEDIAL MENISECTOMY Right 09/10/2021   Procedure: KNEE ARTHROSCOPY WITH MEDIAL MENISCAL ROOT REPAIR;  Surgeon: Cristy Bonner DASEN, MD;  Location: [facility];  Service: Orthopedics;  Laterality: Right;   PILONIDAL CYST EXCISION  1990's   RADIOLOGY WITH ANESTHESIA N/A 11/10/2018   Procedure: MRI WITH ANESTHESIA OF BRAIN AND ORBITS WITH AND WITHOUT CONTRAST;  Surgeon: Radiologist, Medication, MD;  Location: [facility] OR;  Service: Radiology;  Laterality: N/A;   SHOULDER ARTHROSCOPY WITH ROTATOR CUFF REPAIR Right 06/16/2024   Procedure: ARTHROSCOPY, SHOULDER, WITH ROTATOR CUFF REPAIR;  Surgeon: Sharl Selinda Dover, MD;  Location: [facility];  Service: Orthopedics;  Laterality: Right;   SUBACROMIAL DECOMPRESSION Right 06/16/2024   Procedure: DECOMPRESSION, SUBACROMIAL SPACE;  Surgeon: Sharl Selinda Dover, MD;  Location: [facility];  Service:  Orthopedics;  Laterality: Right;  Right shoulder arthroscopy with rotator cuff repair, biceps tenodesis, subacromial decompression, distal clavicle excision   TENDON REPAIR Left 2011   Left Ankle   TOTAL LAPAROSCOPIC HYSTERECTOMY WITH SALPINGECTOMY Bilateral 02/04/2021   Procedure: TOTAL LAPAROSCOPIC HYSTERECTOMY WITH SALPINGECTOMY;  Surgeon: Jannis Kate Norris, MD;  Location: [facility] OR;  Service: Gynecology;  Laterality: Bilateral;   UPPER GASTROINTESTINAL ENDOSCOPY     WISDOM TOOTH EXTRACTION  1990's   Patient Active Problem List   Diagnosis Date Noted   History of nocturnal  hypoglycemia 03/02/2024   Encounter for annual physical exam 03/02/2024   Family history of elevated lipoprotein (a) 04/26/2023   PVC's (premature ventricular contractions) 04/26/2023   Morbid obesity (HCC) 04/13/2023   Gastroesophageal reflux disease 12/24/2022   Abnormal laboratory test 12/24/2022   Chest wall pain 12/24/2022   Change in facial mole 12/29/2021   S/P laparoscopic hysterectomy 02/04/2021   PTSD (post-traumatic stress disorder) 07/21/2018   GAD (generalized anxiety disorder) 07/21/2018   Insomnia 07/21/2018   Recurrent major depressive disorder, in partial remission 07/21/2018   Insulin  resistance 10/21/2017   Hypertriglyceridemia 10/07/2017   Vitamin D  deficiency 10/07/2017   Pre-diabetes 11/02/2016   Morbid obesity with body mass index (BMI) of 50.0 to 59.9 in adult (HCC) 10/15/2016    PCP: Oris Credit NP  REFERRING PROVIDER: Sharl Mayo MD  REFERRING DIAG: s/p right shoulder scope with rotator cuff repair, biceps tenodesis and subacromial decompression  THERAPY DIAG: right shoulder pain; right shoulder stiffness; weakness   Rationale for Evaluation and Treatment: Rehabilitation  ONSET DATE: Spring 2025  SUBJECTIVE:                                                                                                                                                                                      SUBJECTIVE STATEMENT: [name] reports she is unable to wear the TENS due to the adhesive and having a reaction. She states she is going back to the doctors on the 11th of February, to review post-op with surgeon. She was able to perform laundry on Sunday with 3/10 pain, and afterwards pain was 6/10. She states she is currently wearing a lidocaine  patch to help with pain, instead of her usual TENS unit.  Hand dominance: Right  Date of surgery 06/16/2024 s/p right shoulder scope with rotator cuff repair, biceps tenodesis and subacromial decompression Pt reports that MD  advised her that she could sleep with sling off with pillow support and that she may have her arm straight with support A/ROM to begin at 6 weeks  PERTINENT HISTORY: Right shoulder scope with rotator cuff repair, biceps tenodesis, and subacromial decompression on 06/16/24  Knee scope 12/29/23 Can't take NSAIDS b/c of gastric bypass  PAIN:  10/18/2024  Are you having pain? Yes NPRS scale: 0/10-4/10 Pain location: right elbow - achy comes and goes - located at lateral epicondyle, right shoulder achy Pain orientation: Right  PAIN TYPE: aching Pain description: constant  Aggravating factors: repetition,  Relieving factors: medication   PRECAUTIONS: Shoulder surgical   WEIGHT BEARING RESTRICTIONS: No  FALLS:  Has patient fallen in last 6 months? 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  PLOF: Independent  PATIENT GOALS:baseline be 75% ; shopping, goes to [employer] stretching class in the [place]   NEXT MD VISIT:   OBJECTIVE:  Note: Objective measures were completed at Evaluation unless otherwise noted.  DIAGNOSTIC FINDINGS:  Full thickness tear of subscapularis and supraspinatus and biceps tear  PATIENT SURVEYS:  Quick Dash:  QUICK DASH  Please rate your ability do the following activities in the last week by selecting the number below the appropriate response.   Activities Rating  Open a tight or new jar.  5 = Unable  Do heavy household chores (e.g., wash walls, floors). 5 = Unable  Carry a shopping bag or briefcase 5 = Unable  Wash your back. 5 = Unable  Use a knife to cut food. 5 = Unable  Recreational activities in which you take some force or impact through your arm, shoulder or hand (e.g., golf, hammering, tennis, etc.). 5 = Unable  During the past week, to what extent has your arm, shoulder or hand problem interfered with  your normal social activities with family, friends, neighbors or groups?  4 = Quite a bit  During the past week, were you limited in your work or other regular daily activities as a result of your arm, shoulder or hand problem? 5 = Unable  Rate the severity of the following symptoms in the last week: Arm, Shoulder, or hand pain. 4 = Severe  Rate the severity of the following symptoms in the last week: Tingling (pins and needles) in your arm, shoulder or hand. 1 = none  During the past week, how much difficulty have you had sleeping because of the pain in your arm, shoulder or hand?  3 = Moderate difficulty   (A QuickDASH score may not be calculated if there is greater than 1 missing item.)  Quick Dash Disability/Symptom Score: 81.8%   Minimally Clinically Important Difference (MCID): 15-20 points    08/21/2024 Quick DASH : 38.6/100 38.6%   12/31: 29.5%  COGNITION: Overall cognitive status: Within functional limits for tasks assessed     SENSATION: Denies numbness/tingling  APPEARANCE:  3 portal incisions with steri strips; no redness; no drainage; amount of edema consistent with 1 week post op    UPPER EXTREMITY ROM:  Passive ROM Right eval   Shoulder flexion 15   Shoulder extension    Shoulder abduction 10   Shoulder adduction    Shoulder extension    Shoulder internal rotation 45   Shoulder external rotation 0   Elbow flexion    Elbow extension    Wrist flexion    Wrist extension    Wrist ulnar deviation    Wrist radial deviation    Wrist pronation    Wrist supination     (Blank rows = not tested)   Active ROM Right eval Left eval 12/18 12/24 12/31  Right  Shoulder flexion Not tested secondary to surgical precautions WFLS 153 161 167  Shoulder extension       Shoulder abduction  WFLS 135 painful  150 161  Shoulder adduction       Shoulder internal rotation  WFLs Thumb to Coccyx   Thumb to L5  Shoulder external rotation  WFLs 78  86  Elbow flexion       Elbow  extension       Wrist flexion       Wrist extension       Wrist ulnar deviation       Wrist radial deviation       Wrist pronation       Wrist supination       (Blank rows = not tested)  UPPER EXTREMITY MMT:  MMT Right eval Left eval 12/18 R 12/31  Shoulder flexion Not tested secondary to surgical precautions 5 3+ 3+  Shoulder extension  5    Shoulder abduction  5 3- 3-  Shoulder adduction      Shoulder internal rotation  5 4 4   Shoulder external rotation  5 4 4   Middle trapezius      Lower trapezius      Elbow flexion  5    Elbow extension  5    Wrist flexion  5    Wrist extension  5    Wrist ulnar deviation      Wrist radial deviation      Wrist pronation      Wrist supination      Grip strength (lbs)      (Blank rows = not tested)                                                                                                                              TREATMENT DATE:  10/18/24: Supine serratus punch with 2# weight x20 Supine arm at 90 with 2#: ABCs, 12:00/6:00 and 9:00/3:00 2x10 Supine shoulder flexion 2x10 Supine shoulder abduction x10 (restricted at 90 degrees and with pain; with inferior glide pain decreases) Manual Therapy: Inferior mobilization with movement into abduction Supine shoulder horizontal abduction with blue tband 2x12 Supine shoulder ER with green tband 2x12 Supine shoulder retraction x10 (reported pain, instructed on smaller ROM) Supine horizontal abduction retraction x20 Sidelying ER with 2# weight 2x10 Sidelying supination and pronation with arm at neutral 90 2x8 Sidelying 12:00/6:00 and 9:00/3:00 and circles with 1# weight 2x10 Sidelying abduction x10 Wall push up plus 2x10 Shoulder 3 way band x3 with light green loop, x3 no loop bilat Standing IR with red band from neutral 2x10 Standing shoulder wall slides with low trap lift off 2x5   10/04/2024: Supine serratus punch with 2# weight x20 Supine arm at 90 with 2#: ABCs, 12:00/6:00 and  9:00/3:00 2x10 Supine shoulder flexion 1# x10 Supine shoulder horizontal abduction with blue tband 2x12 Supine shoulder ER with green tband (decreased ROM) x12, x5 Supine shoulder retraction  x20 Seated ball rolls out on blue ball 2x10 Sidelying ER (no weight because of pain) 2x20 Sidelying 12:00/6:00 and 9:00/3:00 and circles with 1# weight 2x10 Standing little blue ball ABCs (able to go A-Z) Wall push up plus 2x10 Standing rows with red band 2x15 Standing IR with red band from neutral x8 (1 set due to pain)   09/25/24 With TENS on concurrently with ex (pt's own unit) Landmine press red power cord seated 15x Supine flat: Shoulder flexion 1# 10x full extension of elbow  Incline 30 degrees table: Supine serratus punch # 20x  Supine arm at 90 degrees 2#: ABCs, circles, 12:00/6:00 and 9:00/3:00 Sidelying: Sidelying arm at 90 degrees: 1# circles, 12:00/6:00 and 9:00/3:00 Sidelying external rotation 10x 1#  Prone: Shoulder horizontal abduction small range 1# 10x; Shoulder scaption small range 1# 10x Row 2# 2x10 on Rt Shoulder ROM Standing: Wall push ups 10x Purple ball on wall ABCs (able to make it to A- Z) External rotation (elbow at side) 10x red band Internal rotation elbow at side 10x red band Red physioball wall roll ups with 10 sec holds at the top 10x Cone placement onto 2nd and 3rd shelves 2 minutes   PATIENT EDUCATION: Education details: BERDINE, Educated patient on anatomy and physiology of current symptoms, prognosis, plan of care as well as initial self care strategies to promote recovery  Person educated: Patient Education method: Explanation Education comprehension: verbalized understanding  HOME EXERCISE PROGRAM: Access Code: [email or web address] URL: [email or web address] Date: 09/04/2024 Prepared by: Mliss  Exercises - Supine Cervical Retraction with Towel  - 2 x daily - 7 x weekly - 1 sets - 10 reps - 10 hold - Supine Cervical Rotation AROM on Pillow   - 2 x daily - 7 x weekly - 1 sets - 5 reps - 10 hold - Seated Scapular Retraction  - 2 x daily - 7 x weekly - 1 sets - 10 reps - Seated Cervical Rotation AROM  - 1 x daily - 7 x weekly - 2 sets - 10 reps - Seated Cervical Sidebending AROM  - 1 x daily - 7 x weekly - 2 sets - 10 reps - Seated Cervical Retraction  - 1 x daily - 7 x weekly - 2 sets - 10 reps - Seated Gripping Towel  - 1 x daily - 7 x weekly - 2 sets - 10 reps - Wrist Flexion Extension AROM - Palms Down  - 1 x daily - 7 x weekly - 2 sets - 10 reps - Closing and Opening Hand With Shoulder Sling  - 1 x daily - 7 x weekly - 2 sets - 10 reps - Seated Shoulder Pendulum Exercise  - 1 x daily - 7 x weekly - 2 sets - 10 reps - Supine Shoulder Flexion AAROM  - 2 x daily - 7 x weekly - 2 sets - 10 reps - Prone Shoulder Row  - 2 x daily - 7 x weekly - 2 sets - 10 reps - Standing Isometric Shoulder External Rotation with Doorway  - 1 x daily - 7 x weekly - 1 sets - 5 reps - 5 hold - Standing Isometric Shoulder Abduction with Doorway - Arm Bent  - 1 x daily - 7 x weekly - 1 sets - 5 reps - 5 hold - Standing Isometric Shoulder Flexion with Doorway - Arm Bent  - 1 x daily - 7 x weekly - 1 sets - 5 reps - 5 hold - Standing Isometric Shoulder Extension  with Doorway - Arm Bent  - 1 x daily - 7 x weekly - 1 sets - 5 reps - 5 hold - Shoulder Flexion Wall Slide with Towel  - 2-3 x daily - 7 x weekly - 2 sets - 10 reps - Seated Shoulder Flexion AAROM with Pulley Behind  - 1 x daily - 7 x weekly - 1 sets - 60-120 reps - Seated Shoulder Scaption AAROM with Pulley at Side  - 1 x daily - 7 x weekly - 1 sets - 60-120 reps - Shoulder External Rotation Reactive Isometrics (Mirrored)  - 1 x daily - 3 x weekly - 1-3 sets - 10 reps - Shoulder Internal Rotation Reactive Isometrics  - 1 x daily - 3 x weekly - 1-3 sets - 10 reps - Standing Shoulder Internal Rotation with Anchored Resistance (Mirrored)  - 1 x daily - 3 x weekly - 1-3 sets - 10 reps - Shoulder  External Rotation with Anchored Resistance (Mirrored)  - 1 x daily - 3 x weekly - 1-3 sets - 10 reps - Prone Shoulder Row  - 1 x daily - 3 x weekly - 1-3 sets - 10 reps - Prone Shoulder Extension - Single Arm with Dumbbell  - 1 x daily - 3-4 x weekly - 1-3 sets - 10 reps ASSESSMENT:  CLINICAL IMPRESSION:  Caroline Gonzales presents to PT reporting she is experiencing an increase of pain and has been unable to wear her TENS until due to a skin reaction to the adhesive. She is wearing a lidocaine  patch today to manage pain. Patient progressed through scapular and RCT strengthening and was able to increase weight and reps with certain exercises. She shows improvements in her ability to move through shoulder flexion while in supine. Through abduction in supine she had limitations to about 90 degrees reporting she feels it is stuck there and causing pain, when applying an inferior mobilization her pain improved indicating likely impingement of RCT during abduction. She was able to add bicep tendon strengthening through pronation and supination which appeared fatiguing for the patient. Patient responded well to scaption motion and low trap lift offs. Patient would benefit from a continuation of skilled PT for a further progression of strengthening and functional mobility.     OBJECTIVE IMPAIRMENTS: decreased activity tolerance, decreased ROM, decreased strength, increased edema, impaired perceived functional ability, impaired UE functional use, and pain.   ACTIVITY LIMITATIONS: carrying, lifting, sleeping, bed mobility, bathing, toileting, dressing, reach over head, and hygiene/grooming  PARTICIPATION LIMITATIONS: meal prep, cleaning, laundry, driving, shopping, community activity, and occupation  PERSONAL FACTORS: Time since onset of injury/illness/exacerbation are also affecting patient's functional outcome.   REHAB POTENTIAL: Good  CLINICAL DECISION MAKING: Stable/uncomplicated  EVALUATION COMPLEXITY:  Low   GOALS: Goals reviewed with patient? Yes  SHORT TERM GOALS: Target date: 08/04/2024     The patient will demonstrate knowledge of basic self care strategies and exercises to promote healing  Baseline: Goal status: met 11/17  2.  The patient will report a 40% improvement in pain levels with functional activities which are currently difficult including sleeping, dressing, bathing Baseline:  Goal status: ONGOING, SLEEPING IN RECLINER 11/17  3.  Passive elevation to 150 degrees needed for overhead reaching Baseline:  Goal status: met 11/17  4.  Quick DASH functional outcome score improved to 70% indicating improved function with less pain Baseline:  Goal status: MET (38.6% ) 08/21/2024      LONG TERM GOALS: Target date:12/06/2024    The patient  will be independent in a safe self progression of a home exercise program to promote further recovery of function  Baseline:  Goal status: ongoing  2.  The patient will report a 75% improvement in pain levels with functional activities which are currently difficult including sleeping, grooming/dressing, return to work Baseline:  Goal status: ongoing 3.  The patient will have improved active shoulder elevation ROM to at least 130 degrees needed for grooming/dressing purposes as well as reaching high shelves  Baseline:  Goal status: met 12/24  4.  The patient will be have shoulder external and internal rotation to 40 degrees for dressing Baseline:  Goal status: met 12/31 5.  The patient will have grossly 4/5 strength needed to lift and lower a 2# object from a high shelf  Baseline:  Goal status: ongoing  6.  Quick DASH functional outcome score improved to 60% indicating improved function with less pain Baseline:  Goal status: MET (38.6% ) 08/21/2024   7. Patient will shoulder muscular ROM and endurance to perform overhead activity for 1 minute  Able to cone stack x 2 min (09/25/24) NEW  8.  The patient will have UE strength  to carry a 7# object (farmer's hold) for grocery shopping NEW  9. Quick DASH score improved to 20% indicating improved function with less pain NEW  PLAN:  PT FREQUENCY: 2x/week with tapering of visits  PT DURATION: 12 weeks  PLANNED INTERVENTIONS: 97164- PT Re-evaluation, 97750- Physical Performance Testing, 97110-Therapeutic exercises, 97530- Therapeutic activity, 97112- Neuromuscular re-education, 97535- Self Care, [id number]- Manual therapy, [id number]- Aquatic Therapy, [id number]- Electrical stimulation (unattended), [phone]- Electrical stimulation (manual), 97016- Vasopneumatic device, Patient/Family education, Taping, Joint mobilization, Cryotherapy, and Moist heat  PLAN FOR NEXT SESSION: see how return to work goes; progressive strengthening and shoulder ROM;  TENS during ex, quick DASH  Darell Saputo, SPT 10/18/24, 4:58 PM  I agree with the following treatment note after reviewing documentation. This session was performed under the supervision of a licensed clinician.  Burnard Joy, PT 10/18/24 4:58 PM   [facility] [address], [address] [place], [place] [address] Phone # [phone] Fax [phone]  "

## 2024-10-19 ENCOUNTER — Other Ambulatory Visit (HOSPITAL_COMMUNITY): Payer: Self-pay

## 2024-10-19 ENCOUNTER — Other Ambulatory Visit: Payer: Self-pay

## 2024-10-19 ENCOUNTER — Other Ambulatory Visit: Payer: Self-pay | Admitting: Obstetrics and Gynecology

## 2024-10-19 MED ORDER — ESTRADIOL 0.025 MG/24HR TD PTTW
1.0000 | MEDICATED_PATCH | TRANSDERMAL | 6 refills | Status: AC
  Filled 2024-10-19: qty 24, 84d supply, fill #0

## 2024-10-19 MED ORDER — ESTRADIOL 0.025 MG/24HR TD PTTW
1.0000 | MEDICATED_PATCH | TRANSDERMAL | 0 refills | Status: DC
  Filled 2024-10-19: qty 8, 28d supply, fill #0

## 2024-10-19 NOTE — Telephone Encounter (Signed)
 Insurance is requesting a 90 day supply on the estradiol  patch.   Please advise if this is ok

## 2024-10-20 ENCOUNTER — Encounter: Payer: Self-pay | Admitting: Obstetrics and Gynecology

## 2024-10-20 ENCOUNTER — Other Ambulatory Visit (HOSPITAL_COMMUNITY): Payer: Self-pay

## 2024-10-20 ENCOUNTER — Other Ambulatory Visit: Payer: Self-pay

## 2024-10-20 ENCOUNTER — Encounter: Payer: Self-pay | Admitting: Pharmacist

## 2024-10-20 ENCOUNTER — Other Ambulatory Visit: Payer: Self-pay | Admitting: Obstetrics and Gynecology

## 2024-10-20 DIAGNOSIS — Z1231 Encounter for screening mammogram for malignant neoplasm of breast: Secondary | ICD-10-CM

## 2024-10-23 ENCOUNTER — Ambulatory Visit

## 2024-10-25 ENCOUNTER — Ambulatory Visit

## 2024-10-30 ENCOUNTER — Ambulatory Visit

## 2024-11-01 ENCOUNTER — Ambulatory Visit

## 2024-11-02 ENCOUNTER — Encounter

## 2024-11-02 ENCOUNTER — Ambulatory Visit: Payer: Commercial Managed Care - PPO | Admitting: Obstetrics and Gynecology

## 2024-11-02 ENCOUNTER — Ambulatory Visit: Admitting: Obstetrics and Gynecology

## 2024-11-02 ENCOUNTER — Encounter: Payer: Self-pay | Admitting: Nurse Practitioner

## 2024-11-02 DIAGNOSIS — Z1231 Encounter for screening mammogram for malignant neoplasm of breast: Secondary | ICD-10-CM

## 2024-11-06 ENCOUNTER — Ambulatory Visit

## 2024-11-08 ENCOUNTER — Ambulatory Visit

## 2024-11-15 ENCOUNTER — Ambulatory Visit

## 2024-11-22 ENCOUNTER — Ambulatory Visit

## 2024-11-27 ENCOUNTER — Encounter: Admitting: Nurse Practitioner

## 2024-11-29 ENCOUNTER — Ambulatory Visit

## 2024-12-06 ENCOUNTER — Ambulatory Visit

## 2025-03-15 ENCOUNTER — Encounter: Payer: Self-pay | Admitting: Nurse Practitioner

## 2025-03-28 ENCOUNTER — Ambulatory Visit: Admitting: Physician Assistant
